# Patient Record
Sex: Male | Born: 1950 | Race: Black or African American | Hispanic: No | Marital: Married | Smoking: Former smoker
Health system: Southern US, Community
[De-identification: ages and names within clinical notes are randomized; demographics above are authoritative.]

## PROBLEM LIST (undated history)

## (undated) DIAGNOSIS — Z9289 Personal history of other medical treatment: Secondary | ICD-10-CM

## (undated) DIAGNOSIS — N183 Chronic kidney disease, stage 3 unspecified: Secondary | ICD-10-CM

## (undated) DIAGNOSIS — E1169 Type 2 diabetes mellitus with other specified complication: Secondary | ICD-10-CM

## (undated) DIAGNOSIS — M869 Osteomyelitis, unspecified: Secondary | ICD-10-CM

## (undated) DIAGNOSIS — H905 Unspecified sensorineural hearing loss: Secondary | ICD-10-CM

## (undated) DIAGNOSIS — E119 Type 2 diabetes mellitus without complications: Secondary | ICD-10-CM

## (undated) DIAGNOSIS — E785 Hyperlipidemia, unspecified: Secondary | ICD-10-CM

## (undated) DIAGNOSIS — H3500 Unspecified background retinopathy: Secondary | ICD-10-CM

## (undated) DIAGNOSIS — K219 Gastro-esophageal reflux disease without esophagitis: Secondary | ICD-10-CM

## (undated) DIAGNOSIS — M1990 Unspecified osteoarthritis, unspecified site: Secondary | ICD-10-CM

## (undated) DIAGNOSIS — G4733 Obstructive sleep apnea (adult) (pediatric): Secondary | ICD-10-CM

## (undated) DIAGNOSIS — I10 Essential (primary) hypertension: Secondary | ICD-10-CM

## (undated) DIAGNOSIS — G5790 Unspecified mononeuropathy of unspecified lower limb: Secondary | ICD-10-CM

## (undated) DIAGNOSIS — E13311 Other specified diabetes mellitus with unspecified diabetic retinopathy with macular edema: Secondary | ICD-10-CM

## (undated) DIAGNOSIS — N529 Male erectile dysfunction, unspecified: Secondary | ICD-10-CM

## (undated) DIAGNOSIS — J45909 Unspecified asthma, uncomplicated: Secondary | ICD-10-CM

## (undated) DIAGNOSIS — Z9989 Dependence on other enabling machines and devices: Secondary | ICD-10-CM

## (undated) HISTORY — DX: Hyperlipidemia, unspecified: E78.5

## (undated) HISTORY — DX: Gastro-esophageal reflux disease without esophagitis: K21.9

## (undated) HISTORY — DX: Other specified diabetes mellitus with unspecified diabetic retinopathy with macular edema: E13.311

## (undated) HISTORY — DX: Osteomyelitis, unspecified: M86.9

## (undated) HISTORY — DX: Unspecified mononeuropathy of unspecified lower limb: G57.90

## (undated) HISTORY — DX: Male erectile dysfunction, unspecified: N52.9

## (undated) HISTORY — DX: Unspecified sensorineural hearing loss: H90.5

## (undated) HISTORY — DX: Unspecified background retinopathy: H35.00

## (undated) HISTORY — DX: Type 2 diabetes mellitus with other specified complication: E11.69

## (undated) SURGERY — AMPUTATION DIGIT
Anesthesia: Choice | Laterality: Left

---

## 1998-10-08 ENCOUNTER — Emergency Department (HOSPITAL_COMMUNITY): Admission: EM | Admit: 1998-10-08 | Discharge: 1998-10-08 | Payer: Self-pay | Admitting: Emergency Medicine

## 2006-01-21 ENCOUNTER — Inpatient Hospital Stay (HOSPITAL_COMMUNITY): Admission: EM | Admit: 2006-01-21 | Discharge: 2006-02-09 | Payer: Self-pay | Admitting: Emergency Medicine

## 2006-01-21 ENCOUNTER — Ambulatory Visit: Payer: Self-pay | Admitting: Internal Medicine

## 2006-01-21 ENCOUNTER — Encounter (INDEPENDENT_AMBULATORY_CARE_PROVIDER_SITE_OTHER): Payer: Self-pay | Admitting: *Deleted

## 2006-01-21 HISTORY — PX: TOE AMPUTATION: SHX809

## 2006-02-12 ENCOUNTER — Encounter (HOSPITAL_BASED_OUTPATIENT_CLINIC_OR_DEPARTMENT_OTHER): Admission: RE | Admit: 2006-02-12 | Discharge: 2006-05-13 | Payer: Self-pay | Admitting: Surgery

## 2006-02-16 ENCOUNTER — Ambulatory Visit: Payer: Self-pay | Admitting: Internal Medicine

## 2006-02-23 ENCOUNTER — Encounter: Admission: RE | Admit: 2006-02-23 | Discharge: 2006-02-23 | Payer: Self-pay | Admitting: Internal Medicine

## 2006-03-01 ENCOUNTER — Ambulatory Visit: Payer: Self-pay | Admitting: Internal Medicine

## 2006-03-05 ENCOUNTER — Ambulatory Visit: Payer: Self-pay | Admitting: Internal Medicine

## 2006-04-01 ENCOUNTER — Ambulatory Visit: Payer: Self-pay | Admitting: Internal Medicine

## 2006-05-04 ENCOUNTER — Ambulatory Visit: Payer: Self-pay | Admitting: Internal Medicine

## 2006-05-14 ENCOUNTER — Encounter (HOSPITAL_BASED_OUTPATIENT_CLINIC_OR_DEPARTMENT_OTHER): Admission: RE | Admit: 2006-05-14 | Discharge: 2006-06-02 | Payer: Self-pay | Admitting: Internal Medicine

## 2006-05-20 ENCOUNTER — Ambulatory Visit: Payer: Self-pay | Admitting: Internal Medicine

## 2006-05-21 DIAGNOSIS — M79609 Pain in unspecified limb: Secondary | ICD-10-CM

## 2006-05-21 DIAGNOSIS — S98139A Complete traumatic amputation of one unspecified lesser toe, initial encounter: Secondary | ICD-10-CM | POA: Insufficient documentation

## 2006-06-14 ENCOUNTER — Encounter (HOSPITAL_BASED_OUTPATIENT_CLINIC_OR_DEPARTMENT_OTHER): Admission: RE | Admit: 2006-06-14 | Discharge: 2006-07-26 | Payer: Self-pay | Admitting: Surgery

## 2006-06-18 ENCOUNTER — Encounter (INDEPENDENT_AMBULATORY_CARE_PROVIDER_SITE_OTHER): Payer: Self-pay | Admitting: Dermatology

## 2006-06-18 ENCOUNTER — Ambulatory Visit: Payer: Self-pay | Admitting: Internal Medicine

## 2006-06-18 LAB — CONVERTED CEMR LAB
BUN: 17 mg/dL (ref 6–23)
CO2: 27 meq/L (ref 19–32)
Calcium: 9.2 mg/dL (ref 8.4–10.5)
Chloride: 107 meq/L (ref 96–112)
Creatinine, Ser: 1.51 mg/dL — ABNORMAL HIGH (ref 0.40–1.50)
Glucose, Bld: 149 mg/dL — ABNORMAL HIGH (ref 70–99)
Potassium: 4.4 meq/L (ref 3.5–5.3)
Sodium: 140 meq/L (ref 135–145)

## 2006-08-09 ENCOUNTER — Encounter: Payer: Self-pay | Admitting: Vascular Surgery

## 2006-08-09 ENCOUNTER — Emergency Department (HOSPITAL_COMMUNITY): Admission: EM | Admit: 2006-08-09 | Discharge: 2006-08-09 | Payer: Self-pay | Admitting: Emergency Medicine

## 2006-08-13 ENCOUNTER — Ambulatory Visit: Payer: Self-pay | Admitting: Internal Medicine

## 2006-08-13 ENCOUNTER — Inpatient Hospital Stay (HOSPITAL_COMMUNITY): Admission: AD | Admit: 2006-08-13 | Discharge: 2006-08-14 | Payer: Self-pay | Admitting: Internal Medicine

## 2006-08-13 DIAGNOSIS — M869 Osteomyelitis, unspecified: Secondary | ICD-10-CM | POA: Insufficient documentation

## 2006-08-13 LAB — CONVERTED CEMR LAB: Blood Glucose, Fingerstick: 114

## 2006-08-16 DIAGNOSIS — R42 Dizziness and giddiness: Secondary | ICD-10-CM

## 2006-08-16 DIAGNOSIS — H9319 Tinnitus, unspecified ear: Secondary | ICD-10-CM | POA: Insufficient documentation

## 2006-08-19 ENCOUNTER — Encounter (INDEPENDENT_AMBULATORY_CARE_PROVIDER_SITE_OTHER): Payer: Self-pay | Admitting: Dermatology

## 2006-08-23 ENCOUNTER — Ambulatory Visit: Payer: Self-pay | Admitting: Hospitalist

## 2006-08-23 DIAGNOSIS — F17200 Nicotine dependence, unspecified, uncomplicated: Secondary | ICD-10-CM | POA: Insufficient documentation

## 2006-08-23 LAB — CONVERTED CEMR LAB: Blood Glucose, Fingerstick: 69

## 2006-08-24 ENCOUNTER — Encounter (HOSPITAL_BASED_OUTPATIENT_CLINIC_OR_DEPARTMENT_OTHER): Admission: RE | Admit: 2006-08-24 | Discharge: 2006-10-08 | Payer: Self-pay | Admitting: Surgery

## 2006-08-24 ENCOUNTER — Encounter (INDEPENDENT_AMBULATORY_CARE_PROVIDER_SITE_OTHER): Payer: Self-pay | Admitting: Dermatology

## 2006-09-01 ENCOUNTER — Encounter (INDEPENDENT_AMBULATORY_CARE_PROVIDER_SITE_OTHER): Payer: Self-pay | Admitting: Dermatology

## 2006-09-13 ENCOUNTER — Encounter (INDEPENDENT_AMBULATORY_CARE_PROVIDER_SITE_OTHER): Payer: Self-pay | Admitting: Internal Medicine

## 2006-09-13 ENCOUNTER — Ambulatory Visit: Payer: Self-pay | Admitting: *Deleted

## 2006-09-13 LAB — CONVERTED CEMR LAB
BUN: 17 mg/dL (ref 6–23)
Blood Glucose, Fingerstick: 168
Chloride: 104 meq/L (ref 96–112)
Cortisol, Plasma: 10.1 ug/dL
Hemoglobin: 13.5 g/dL (ref 13.0–17.0)
Potassium: 4.9 meq/L (ref 3.5–5.3)
RBC: 4.64 M/uL (ref 4.22–5.81)
RDW: 13.8 % (ref 11.5–14.0)
Sodium: 137 meq/L (ref 135–145)
TSH: 1.912 microintl units/mL (ref 0.350–5.50)

## 2006-09-16 ENCOUNTER — Encounter (INDEPENDENT_AMBULATORY_CARE_PROVIDER_SITE_OTHER): Payer: Self-pay | Admitting: Dermatology

## 2006-09-27 ENCOUNTER — Ambulatory Visit: Payer: Self-pay | Admitting: *Deleted

## 2006-09-27 ENCOUNTER — Encounter (INDEPENDENT_AMBULATORY_CARE_PROVIDER_SITE_OTHER): Payer: Self-pay | Admitting: Internal Medicine

## 2006-09-27 DIAGNOSIS — L989 Disorder of the skin and subcutaneous tissue, unspecified: Secondary | ICD-10-CM | POA: Insufficient documentation

## 2006-09-27 LAB — CONVERTED CEMR LAB: Hgb A1c MFr Bld: 6.7 %

## 2006-09-29 ENCOUNTER — Telehealth (INDEPENDENT_AMBULATORY_CARE_PROVIDER_SITE_OTHER): Payer: Self-pay | Admitting: *Deleted

## 2006-10-11 ENCOUNTER — Encounter: Payer: Self-pay | Admitting: Internal Medicine

## 2006-11-03 ENCOUNTER — Telehealth: Payer: Self-pay | Admitting: *Deleted

## 2006-11-04 ENCOUNTER — Encounter (HOSPITAL_BASED_OUTPATIENT_CLINIC_OR_DEPARTMENT_OTHER): Admission: RE | Admit: 2006-11-04 | Discharge: 2006-11-24 | Payer: Self-pay | Admitting: Surgery

## 2006-11-11 ENCOUNTER — Encounter (INDEPENDENT_AMBULATORY_CARE_PROVIDER_SITE_OTHER): Payer: Self-pay | Admitting: Dermatology

## 2006-11-16 ENCOUNTER — Telehealth: Payer: Self-pay | Admitting: *Deleted

## 2006-11-19 ENCOUNTER — Encounter (INDEPENDENT_AMBULATORY_CARE_PROVIDER_SITE_OTHER): Payer: Self-pay | Admitting: Dermatology

## 2006-11-29 ENCOUNTER — Ambulatory Visit (HOSPITAL_COMMUNITY): Admission: RE | Admit: 2006-11-29 | Discharge: 2006-11-29 | Payer: Self-pay | Admitting: Internal Medicine

## 2006-11-29 ENCOUNTER — Encounter (INDEPENDENT_AMBULATORY_CARE_PROVIDER_SITE_OTHER): Payer: Self-pay | Admitting: Internal Medicine

## 2006-12-01 ENCOUNTER — Ambulatory Visit: Payer: Self-pay | Admitting: Hospitalist

## 2006-12-01 ENCOUNTER — Encounter (INDEPENDENT_AMBULATORY_CARE_PROVIDER_SITE_OTHER): Payer: Self-pay | Admitting: Dermatology

## 2006-12-01 ENCOUNTER — Encounter (INDEPENDENT_AMBULATORY_CARE_PROVIDER_SITE_OTHER): Payer: Self-pay | Admitting: Internal Medicine

## 2006-12-01 DIAGNOSIS — N183 Chronic kidney disease, stage 3 (moderate): Secondary | ICD-10-CM

## 2006-12-01 LAB — CONVERTED CEMR LAB: Blood Glucose, Fingerstick: 141

## 2006-12-10 ENCOUNTER — Ambulatory Visit (HOSPITAL_COMMUNITY): Admission: RE | Admit: 2006-12-10 | Discharge: 2006-12-10 | Payer: Self-pay | Admitting: Internal Medicine

## 2006-12-10 ENCOUNTER — Encounter (INDEPENDENT_AMBULATORY_CARE_PROVIDER_SITE_OTHER): Payer: Self-pay | Admitting: Orthopaedic Surgery

## 2006-12-10 ENCOUNTER — Ambulatory Visit: Payer: Self-pay | Admitting: Vascular Surgery

## 2006-12-16 ENCOUNTER — Ambulatory Visit: Payer: Self-pay | Admitting: Internal Medicine

## 2006-12-16 ENCOUNTER — Encounter (INDEPENDENT_AMBULATORY_CARE_PROVIDER_SITE_OTHER): Payer: Self-pay | Admitting: Dermatology

## 2006-12-16 DIAGNOSIS — E1142 Type 2 diabetes mellitus with diabetic polyneuropathy: Secondary | ICD-10-CM

## 2006-12-16 LAB — CONVERTED CEMR LAB
BUN: 18 mg/dL (ref 6–23)
Blood Glucose, Fingerstick: 223
Calcium: 9 mg/dL (ref 8.4–10.5)
Creatinine, Ser: 1.26 mg/dL (ref 0.40–1.50)
Glucose, Bld: 175 mg/dL — ABNORMAL HIGH (ref 70–99)
Potassium: 4.6 meq/L (ref 3.5–5.3)

## 2006-12-30 ENCOUNTER — Encounter (INDEPENDENT_AMBULATORY_CARE_PROVIDER_SITE_OTHER): Payer: Self-pay | Admitting: Dermatology

## 2006-12-30 ENCOUNTER — Ambulatory Visit: Payer: Self-pay | Admitting: Internal Medicine

## 2006-12-30 LAB — CONVERTED CEMR LAB: Blood Glucose, Fingerstick: 195

## 2007-01-03 ENCOUNTER — Encounter: Payer: Self-pay | Admitting: Internal Medicine

## 2007-01-03 DIAGNOSIS — H905 Unspecified sensorineural hearing loss: Secondary | ICD-10-CM | POA: Insufficient documentation

## 2007-01-03 HISTORY — DX: Unspecified sensorineural hearing loss: H90.5

## 2007-01-31 ENCOUNTER — Telehealth (INDEPENDENT_AMBULATORY_CARE_PROVIDER_SITE_OTHER): Payer: Self-pay | Admitting: *Deleted

## 2007-02-08 ENCOUNTER — Encounter: Payer: Self-pay | Admitting: Internal Medicine

## 2007-02-14 ENCOUNTER — Telehealth: Payer: Self-pay | Admitting: Internal Medicine

## 2007-03-16 ENCOUNTER — Ambulatory Visit: Payer: Self-pay | Admitting: Internal Medicine

## 2007-03-16 LAB — CONVERTED CEMR LAB: Blood Glucose, Fingerstick: 278

## 2007-03-17 LAB — CONVERTED CEMR LAB
ALT: 18 units/L (ref 0–53)
AST: 14 units/L (ref 0–37)
Albumin: 4.2 g/dL (ref 3.5–5.2)
Alkaline Phosphatase: 113 units/L (ref 39–117)
BUN: 14 mg/dL (ref 6–23)
Basophils Absolute: 0 10*3/uL (ref 0.0–0.1)
Basophils Relative: 1 % (ref 0–1)
Calcium: 8.9 mg/dL (ref 8.4–10.5)
Chloride: 103 meq/L (ref 96–112)
Eosinophils Absolute: 0.2 10*3/uL (ref 0.0–0.7)
HDL: 35 mg/dL — ABNORMAL LOW (ref >39)
LDL Cholesterol: 100 mg/dL — ABNORMAL HIGH (ref 0–99)
MCHC: 33.7 g/dL (ref 30.0–36.0)
MCV: 87.5 fL (ref 78.0–100.0)
Monocytes Relative: 9 % (ref 3–11)
Neutro Abs: 2.9 10*3/uL (ref 1.7–7.7)
Neutrophils Relative %: 55 % (ref 43–77)
Platelets: 198 10*3/uL (ref 150–400)
Potassium: 4.3 meq/L (ref 3.5–5.3)
RBC: 4.31 M/uL (ref 4.22–5.81)
RDW: 14.1 % — ABNORMAL HIGH (ref 11.5–14.0)
Sodium: 136 meq/L (ref 135–145)

## 2007-03-30 ENCOUNTER — Telehealth (INDEPENDENT_AMBULATORY_CARE_PROVIDER_SITE_OTHER): Payer: Self-pay | Admitting: *Deleted

## 2007-04-13 ENCOUNTER — Encounter (HOSPITAL_BASED_OUTPATIENT_CLINIC_OR_DEPARTMENT_OTHER): Admission: RE | Admit: 2007-04-13 | Discharge: 2007-04-28 | Payer: Self-pay | Admitting: Surgery

## 2007-04-18 ENCOUNTER — Encounter: Payer: Self-pay | Admitting: Internal Medicine

## 2007-05-31 ENCOUNTER — Ambulatory Visit: Payer: Self-pay | Admitting: Internal Medicine

## 2007-05-31 DIAGNOSIS — F528 Other sexual dysfunction not due to a substance or known physiological condition: Secondary | ICD-10-CM | POA: Insufficient documentation

## 2007-05-31 LAB — CONVERTED CEMR LAB
Calcium: 8.9 mg/dL (ref 8.4–10.5)
Creatinine, Ser: 1.54 mg/dL — ABNORMAL HIGH (ref 0.40–1.50)
Glucose, Bld: 141 mg/dL — ABNORMAL HIGH (ref 70–99)
Sodium: 140 meq/L (ref 135–145)

## 2007-06-07 ENCOUNTER — Telehealth: Payer: Self-pay | Admitting: *Deleted

## 2007-07-22 ENCOUNTER — Encounter: Payer: Self-pay | Admitting: Internal Medicine

## 2007-08-15 ENCOUNTER — Ambulatory Visit: Payer: Self-pay | Admitting: Internal Medicine

## 2007-08-15 DIAGNOSIS — E11319 Type 2 diabetes mellitus with unspecified diabetic retinopathy without macular edema: Secondary | ICD-10-CM

## 2007-08-15 DIAGNOSIS — E785 Hyperlipidemia, unspecified: Secondary | ICD-10-CM

## 2007-08-15 DIAGNOSIS — E113299 Type 2 diabetes mellitus with mild nonproliferative diabetic retinopathy without macular edema, unspecified eye: Secondary | ICD-10-CM | POA: Insufficient documentation

## 2007-08-15 LAB — CONVERTED CEMR LAB
ALT: 22 units/L (ref 0–53)
AST: 15 units/L (ref 0–37)
Albumin: 4.6 g/dL (ref 3.5–5.2)
Alkaline Phosphatase: 118 units/L — ABNORMAL HIGH (ref 39–117)
Basophils Absolute: 0 10*3/uL (ref 0.0–0.1)
Basophils Relative: 0 % (ref 0–1)
Calcium: 9.7 mg/dL (ref 8.4–10.5)
Chloride: 102 meq/L (ref 96–112)
Eosinophils Absolute: 0.1 10*3/uL (ref 0.0–0.7)
Hgb A1c MFr Bld: 10 %
MCHC: 33.3 g/dL (ref 30.0–36.0)
Monocytes Relative: 10 % (ref 3–12)
Neutro Abs: 2.6 10*3/uL (ref 1.7–7.7)
Neutrophils Relative %: 51 % (ref 43–77)
Platelets: 168 10*3/uL (ref 150–400)
Potassium: 5.1 meq/L (ref 3.5–5.3)
RBC: 4.63 M/uL (ref 4.22–5.81)
RDW: 13.7 % (ref 11.5–15.5)
Sodium: 137 meq/L (ref 135–145)
Total Protein: 7.6 g/dL (ref 6.0–8.3)

## 2007-09-06 ENCOUNTER — Encounter: Payer: Self-pay | Admitting: Internal Medicine

## 2007-09-15 ENCOUNTER — Telehealth (INDEPENDENT_AMBULATORY_CARE_PROVIDER_SITE_OTHER): Payer: Self-pay | Admitting: *Deleted

## 2007-09-19 ENCOUNTER — Ambulatory Visit: Payer: Self-pay | Admitting: Infectious Diseases

## 2007-09-21 ENCOUNTER — Telehealth: Payer: Self-pay | Admitting: Internal Medicine

## 2007-10-04 ENCOUNTER — Encounter: Payer: Self-pay | Admitting: Internal Medicine

## 2007-10-05 ENCOUNTER — Telehealth: Payer: Self-pay | Admitting: *Deleted

## 2007-10-07 DIAGNOSIS — E11311 Type 2 diabetes mellitus with unspecified diabetic retinopathy with macular edema: Secondary | ICD-10-CM | POA: Insufficient documentation

## 2007-10-19 ENCOUNTER — Ambulatory Visit: Payer: Self-pay | Admitting: Internal Medicine

## 2007-10-25 ENCOUNTER — Encounter: Payer: Self-pay | Admitting: Internal Medicine

## 2007-11-09 ENCOUNTER — Ambulatory Visit: Payer: Self-pay | Admitting: Internal Medicine

## 2007-11-09 ENCOUNTER — Ambulatory Visit: Payer: Self-pay | Admitting: Infectious Disease

## 2007-11-09 LAB — CONVERTED CEMR LAB
ALT: 32 units/L (ref 0–53)
Albumin: 4.4 g/dL (ref 3.5–5.2)
Alkaline Phosphatase: 109 units/L (ref 39–117)
CO2: 24 meq/L (ref 19–32)
Glucose, Bld: 308 mg/dL — ABNORMAL HIGH (ref 70–99)
Hgb A1c MFr Bld: 11.1 %
Potassium: 4.9 meq/L (ref 3.5–5.3)
Sodium: 136 meq/L (ref 135–145)
Total Bilirubin: 0.6 mg/dL (ref 0.3–1.2)
Total Protein: 7.4 g/dL (ref 6.0–8.3)

## 2007-12-06 ENCOUNTER — Telehealth (INDEPENDENT_AMBULATORY_CARE_PROVIDER_SITE_OTHER): Payer: Self-pay | Admitting: Pharmacy Technician

## 2007-12-07 ENCOUNTER — Ambulatory Visit: Payer: Self-pay | Admitting: Internal Medicine

## 2007-12-13 ENCOUNTER — Ambulatory Visit: Payer: Self-pay | Admitting: Internal Medicine

## 2007-12-13 LAB — CONVERTED CEMR LAB
OCCULT 2: NEGATIVE
OCCULT 3: NEGATIVE

## 2007-12-14 ENCOUNTER — Encounter: Payer: Self-pay | Admitting: Internal Medicine

## 2007-12-14 ENCOUNTER — Ambulatory Visit: Payer: Self-pay | Admitting: *Deleted

## 2007-12-14 LAB — CONVERTED CEMR LAB: Blood Glucose, Fingerstick: 224

## 2007-12-21 ENCOUNTER — Encounter: Payer: Self-pay | Admitting: Internal Medicine

## 2007-12-23 LAB — CONVERTED CEMR LAB
BUN: 22 mg/dL (ref 6–23)
Chloride: 104 meq/L (ref 96–112)
Creatinine, Ser: 1.36 mg/dL (ref 0.40–1.50)
Creatinine, Urine: 153.3 mg/dL
Glucose, Bld: 276 mg/dL — ABNORMAL HIGH (ref 70–99)
LDL Cholesterol: 113 mg/dL — ABNORMAL HIGH (ref 0–99)
Microalb, Ur: 1.08 mg/dL (ref 0.00–1.89)
Potassium: 4.7 meq/L (ref 3.5–5.3)
Triglycerides: 153 mg/dL — ABNORMAL HIGH (ref <150)
VLDL: 31 mg/dL (ref 0–40)

## 2007-12-28 ENCOUNTER — Ambulatory Visit: Payer: Self-pay | Admitting: Internal Medicine

## 2007-12-28 DIAGNOSIS — E1165 Type 2 diabetes mellitus with hyperglycemia: Secondary | ICD-10-CM

## 2007-12-28 LAB — CONVERTED CEMR LAB: Blood Glucose, Fingerstick: 278

## 2008-01-10 ENCOUNTER — Telehealth: Payer: Self-pay | Admitting: *Deleted

## 2008-01-18 ENCOUNTER — Telehealth (INDEPENDENT_AMBULATORY_CARE_PROVIDER_SITE_OTHER): Payer: Self-pay | Admitting: *Deleted

## 2008-01-24 ENCOUNTER — Encounter (INDEPENDENT_AMBULATORY_CARE_PROVIDER_SITE_OTHER): Payer: Self-pay | Admitting: Internal Medicine

## 2008-01-24 ENCOUNTER — Telehealth: Payer: Self-pay | Admitting: Internal Medicine

## 2008-01-24 ENCOUNTER — Ambulatory Visit: Payer: Self-pay | Admitting: *Deleted

## 2008-01-25 ENCOUNTER — Ambulatory Visit: Payer: Self-pay | Admitting: Infectious Diseases

## 2008-01-25 ENCOUNTER — Encounter (INDEPENDENT_AMBULATORY_CARE_PROVIDER_SITE_OTHER): Payer: Self-pay | Admitting: Internal Medicine

## 2008-01-25 LAB — CONVERTED CEMR LAB: Blood Glucose, Fingerstick: 245

## 2008-01-26 ENCOUNTER — Telehealth: Payer: Self-pay | Admitting: Internal Medicine

## 2008-01-26 LAB — CONVERTED CEMR LAB
ALT: 26 units/L (ref 0–53)
AST: 14 units/L (ref 0–37)
Albumin: 4.3 g/dL (ref 3.5–5.2)
Calcium: 9.5 mg/dL (ref 8.4–10.5)
Chloride: 103 meq/L (ref 96–112)
Potassium: 4.6 meq/L (ref 3.5–5.3)
Sodium: 141 meq/L (ref 135–145)
Total Protein: 7.5 g/dL (ref 6.0–8.3)

## 2008-01-27 ENCOUNTER — Encounter: Payer: Self-pay | Admitting: Internal Medicine

## 2008-01-27 ENCOUNTER — Telehealth (INDEPENDENT_AMBULATORY_CARE_PROVIDER_SITE_OTHER): Payer: Self-pay | Admitting: *Deleted

## 2008-01-31 ENCOUNTER — Telehealth (INDEPENDENT_AMBULATORY_CARE_PROVIDER_SITE_OTHER): Payer: Self-pay | Admitting: *Deleted

## 2008-02-09 ENCOUNTER — Ambulatory Visit: Payer: Self-pay | Admitting: Internal Medicine

## 2008-02-09 ENCOUNTER — Encounter (INDEPENDENT_AMBULATORY_CARE_PROVIDER_SITE_OTHER): Payer: Self-pay | Admitting: Internal Medicine

## 2008-02-20 ENCOUNTER — Encounter: Payer: Self-pay | Admitting: Internal Medicine

## 2008-02-23 ENCOUNTER — Encounter: Payer: Self-pay | Admitting: Internal Medicine

## 2008-02-23 ENCOUNTER — Ambulatory Visit: Payer: Self-pay | Admitting: Infectious Diseases

## 2008-02-23 ENCOUNTER — Ambulatory Visit (HOSPITAL_COMMUNITY): Admission: RE | Admit: 2008-02-23 | Discharge: 2008-02-23 | Payer: Self-pay | Admitting: Infectious Diseases

## 2008-02-23 DIAGNOSIS — R0609 Other forms of dyspnea: Secondary | ICD-10-CM | POA: Insufficient documentation

## 2008-02-23 DIAGNOSIS — R0989 Other specified symptoms and signs involving the circulatory and respiratory systems: Secondary | ICD-10-CM

## 2008-02-27 ENCOUNTER — Encounter: Payer: Self-pay | Admitting: Internal Medicine

## 2008-03-05 ENCOUNTER — Telehealth: Payer: Self-pay | Admitting: Internal Medicine

## 2008-04-11 ENCOUNTER — Ambulatory Visit (HOSPITAL_COMMUNITY): Admission: RE | Admit: 2008-04-11 | Discharge: 2008-04-11 | Payer: Self-pay | Admitting: Internal Medicine

## 2008-04-11 ENCOUNTER — Ambulatory Visit: Payer: Self-pay | Admitting: Internal Medicine

## 2008-04-11 LAB — CONVERTED CEMR LAB
ALT: 33 units/L (ref 0–53)
Basophils Absolute: 0 10*3/uL (ref 0.0–0.1)
Blood Glucose, Fingerstick: 165
CO2: 28 meq/L (ref 19–32)
Creatinine, Ser: 1.4 mg/dL (ref 0.40–1.50)
Eosinophils Relative: 4 % (ref 0–5)
HCT: 42.7 % (ref 39.0–52.0)
Hemoglobin: 13.9 g/dL (ref 13.0–17.0)
Lymphocytes Relative: 37 % (ref 12–46)
MCHC: 32.5 g/dL (ref 30.0–36.0)
Monocytes Absolute: 0.6 10*3/uL (ref 0.1–1.0)
Pro B Natriuretic peptide (BNP): <30 pg/mL (ref 0.0–100.0)
RDW: 13.6 % (ref 11.5–15.5)
Total Bilirubin: 0.7 mg/dL (ref 0.3–1.2)

## 2008-04-12 ENCOUNTER — Telehealth: Payer: Self-pay | Admitting: Internal Medicine

## 2008-04-13 ENCOUNTER — Encounter: Payer: Self-pay | Admitting: Internal Medicine

## 2008-04-19 ENCOUNTER — Ambulatory Visit (HOSPITAL_COMMUNITY): Admission: RE | Admit: 2008-04-19 | Discharge: 2008-04-19 | Payer: Self-pay | Admitting: Internal Medicine

## 2008-04-19 ENCOUNTER — Encounter: Payer: Self-pay | Admitting: Internal Medicine

## 2008-05-09 ENCOUNTER — Encounter: Payer: Self-pay | Admitting: Internal Medicine

## 2008-05-09 ENCOUNTER — Telehealth: Payer: Self-pay | Admitting: Internal Medicine

## 2008-05-09 ENCOUNTER — Ambulatory Visit: Payer: Self-pay | Admitting: Internal Medicine

## 2008-05-09 LAB — CONVERTED CEMR LAB

## 2008-05-28 ENCOUNTER — Telehealth: Payer: Self-pay | Admitting: *Deleted

## 2008-05-28 ENCOUNTER — Telehealth: Payer: Self-pay | Admitting: Internal Medicine

## 2008-06-05 ENCOUNTER — Telehealth (INDEPENDENT_AMBULATORY_CARE_PROVIDER_SITE_OTHER): Payer: Self-pay | Admitting: *Deleted

## 2008-06-27 ENCOUNTER — Ambulatory Visit: Payer: Self-pay | Admitting: Internal Medicine

## 2008-06-27 DIAGNOSIS — K219 Gastro-esophageal reflux disease without esophagitis: Secondary | ICD-10-CM

## 2008-06-27 LAB — CONVERTED CEMR LAB
Blood Glucose, Fingerstick: 308
Hgb A1c MFr Bld: 10.7 %

## 2008-06-27 LAB — FECAL OCCULT BLOOD, GUAIAC: Fecal Occult Blood: NEGATIVE

## 2008-06-28 ENCOUNTER — Ambulatory Visit: Payer: Self-pay | Admitting: Internal Medicine

## 2008-06-28 LAB — CONVERTED CEMR LAB
ALT: 32 units/L (ref 0–53)
Albumin: 4.1 g/dL (ref 3.5–5.2)
CO2: 23 meq/L (ref 19–32)
Cholesterol: 147 mg/dL (ref 0–200)
Glucose, Bld: 381 mg/dL — ABNORMAL HIGH (ref 70–99)
HCT: 42.2 % (ref 39.0–52.0)
LDL Cholesterol: 57 mg/dL (ref 0–99)
Lymphocytes Relative: 34 % (ref 12–46)
Lymphs Abs: 2 10*3/uL (ref 0.7–4.0)
Neutro Abs: 3.1 10*3/uL (ref 1.7–7.7)
Neutrophils Relative %: 54 % (ref 43–77)
Platelets: 175 10*3/uL (ref 150–400)
Potassium: 4.6 meq/L (ref 3.5–5.3)
Sodium: 134 meq/L — ABNORMAL LOW (ref 135–145)
Total Protein: 7 g/dL (ref 6.0–8.3)
Triglycerides: 322 mg/dL — ABNORMAL HIGH (ref <150)
WBC: 5.8 10*3/uL (ref 4.0–10.5)

## 2008-07-04 ENCOUNTER — Ambulatory Visit: Payer: Self-pay | Admitting: Internal Medicine

## 2008-07-04 LAB — CONVERTED CEMR LAB
OCCULT 2: NEGATIVE
OCCULT 3: NEGATIVE

## 2008-07-09 ENCOUNTER — Telehealth: Payer: Self-pay | Admitting: Internal Medicine

## 2008-07-18 ENCOUNTER — Telehealth: Payer: Self-pay | Admitting: Internal Medicine

## 2008-07-18 ENCOUNTER — Encounter: Payer: Self-pay | Admitting: Internal Medicine

## 2008-07-19 ENCOUNTER — Telehealth: Payer: Self-pay | Admitting: Internal Medicine

## 2008-08-03 ENCOUNTER — Encounter: Payer: Self-pay | Admitting: Internal Medicine

## 2008-08-08 ENCOUNTER — Ambulatory Visit: Payer: Self-pay | Admitting: Internal Medicine

## 2008-08-08 LAB — CONVERTED CEMR LAB
ALT: 29 units/L (ref 0–53)
AST: 26 units/L (ref 0–37)
CO2: 24 meq/L (ref 19–32)
Chloride: 106 meq/L (ref 96–112)
Sodium: 141 meq/L (ref 135–145)
Total Bilirubin: 0.6 mg/dL (ref 0.3–1.2)
Total Protein: 7.3 g/dL (ref 6.0–8.3)

## 2008-08-10 ENCOUNTER — Encounter: Payer: Self-pay | Admitting: Internal Medicine

## 2008-08-22 ENCOUNTER — Telehealth: Payer: Self-pay | Admitting: *Deleted

## 2008-08-24 ENCOUNTER — Telehealth: Payer: Self-pay | Admitting: Internal Medicine

## 2008-08-27 ENCOUNTER — Telehealth (INDEPENDENT_AMBULATORY_CARE_PROVIDER_SITE_OTHER): Payer: Self-pay | Admitting: *Deleted

## 2008-08-29 ENCOUNTER — Encounter: Payer: Self-pay | Admitting: Internal Medicine

## 2008-08-30 ENCOUNTER — Encounter: Payer: Self-pay | Admitting: Internal Medicine

## 2008-09-12 ENCOUNTER — Ambulatory Visit: Payer: Self-pay | Admitting: Internal Medicine

## 2008-09-12 LAB — CONVERTED CEMR LAB
Alkaline Phosphatase: 115 units/L (ref 39–117)
CO2: 24 meq/L (ref 19–32)
Creatinine, Ser: 1.43 mg/dL (ref 0.40–1.50)
Glucose, Bld: 227 mg/dL — ABNORMAL HIGH (ref 70–99)
PSA: 0.35 ng/mL (ref 0.10–4.00)
Sodium: 140 meq/L (ref 135–145)
Total Bilirubin: 0.4 mg/dL (ref 0.3–1.2)
Total Protein: 7 g/dL (ref 6.0–8.3)

## 2008-09-24 ENCOUNTER — Encounter: Payer: Self-pay | Admitting: Internal Medicine

## 2008-09-26 ENCOUNTER — Encounter: Payer: Self-pay | Admitting: Internal Medicine

## 2008-09-26 ENCOUNTER — Ambulatory Visit: Payer: Self-pay | Admitting: Internal Medicine

## 2008-10-01 ENCOUNTER — Telehealth (INDEPENDENT_AMBULATORY_CARE_PROVIDER_SITE_OTHER): Payer: Self-pay | Admitting: Pharmacy Technician

## 2008-10-01 ENCOUNTER — Encounter: Payer: Self-pay | Admitting: Internal Medicine

## 2008-10-10 ENCOUNTER — Telehealth: Payer: Self-pay | Admitting: Internal Medicine

## 2008-10-17 ENCOUNTER — Telehealth (INDEPENDENT_AMBULATORY_CARE_PROVIDER_SITE_OTHER): Payer: Self-pay | Admitting: Pharmacy Technician

## 2008-10-24 ENCOUNTER — Telehealth (INDEPENDENT_AMBULATORY_CARE_PROVIDER_SITE_OTHER): Payer: Self-pay | Admitting: *Deleted

## 2008-10-25 ENCOUNTER — Telehealth: Payer: Self-pay | Admitting: *Deleted

## 2008-10-29 ENCOUNTER — Telehealth: Payer: Self-pay | Admitting: *Deleted

## 2008-10-31 ENCOUNTER — Telehealth: Payer: Self-pay | Admitting: Internal Medicine

## 2008-11-06 ENCOUNTER — Encounter: Payer: Self-pay | Admitting: Internal Medicine

## 2008-11-26 ENCOUNTER — Telehealth: Payer: Self-pay | Admitting: *Deleted

## 2008-11-29 ENCOUNTER — Telehealth (INDEPENDENT_AMBULATORY_CARE_PROVIDER_SITE_OTHER): Payer: Self-pay | Admitting: Pharmacy Technician

## 2008-12-12 ENCOUNTER — Ambulatory Visit: Payer: Self-pay | Admitting: Internal Medicine

## 2008-12-12 LAB — CONVERTED CEMR LAB
Blood Glucose, Fingerstick: 85
Hgb A1c MFr Bld: 8.3 %

## 2008-12-13 ENCOUNTER — Encounter: Payer: Self-pay | Admitting: Internal Medicine

## 2008-12-13 DIAGNOSIS — L84 Corns and callosities: Secondary | ICD-10-CM

## 2008-12-14 ENCOUNTER — Telehealth (INDEPENDENT_AMBULATORY_CARE_PROVIDER_SITE_OTHER): Payer: Self-pay | Admitting: *Deleted

## 2008-12-17 ENCOUNTER — Encounter: Payer: Self-pay | Admitting: Internal Medicine

## 2008-12-18 ENCOUNTER — Telehealth (INDEPENDENT_AMBULATORY_CARE_PROVIDER_SITE_OTHER): Payer: Self-pay | Admitting: *Deleted

## 2008-12-26 ENCOUNTER — Telehealth: Payer: Self-pay | Admitting: *Deleted

## 2008-12-27 ENCOUNTER — Telehealth: Payer: Self-pay | Admitting: Internal Medicine

## 2008-12-29 ENCOUNTER — Telehealth: Payer: Self-pay | Admitting: Internal Medicine

## 2009-01-01 ENCOUNTER — Telehealth: Payer: Self-pay | Admitting: Internal Medicine

## 2009-01-03 ENCOUNTER — Telehealth: Payer: Self-pay | Admitting: Internal Medicine

## 2009-01-15 ENCOUNTER — Encounter: Payer: Self-pay | Admitting: Internal Medicine

## 2009-01-16 ENCOUNTER — Telehealth: Payer: Self-pay | Admitting: *Deleted

## 2009-01-16 ENCOUNTER — Encounter: Payer: Self-pay | Admitting: Internal Medicine

## 2009-01-31 ENCOUNTER — Telehealth: Payer: Self-pay | Admitting: *Deleted

## 2009-02-13 ENCOUNTER — Ambulatory Visit: Payer: Self-pay | Admitting: Internal Medicine

## 2009-02-13 DIAGNOSIS — R609 Edema, unspecified: Secondary | ICD-10-CM | POA: Insufficient documentation

## 2009-02-13 LAB — CONVERTED CEMR LAB
Blood Glucose, Fingerstick: 142
CO2: 27 meq/L (ref 19–32)
Calcium: 9.2 mg/dL (ref 8.4–10.5)
Creatinine, Ser: 1.52 mg/dL — ABNORMAL HIGH (ref 0.40–1.50)
Hgb A1c MFr Bld: 8.8 %
Pro B Natriuretic peptide (BNP): <30 pg/mL (ref 0.0–100.0)
Sodium: 140 meq/L (ref 135–145)

## 2009-02-20 ENCOUNTER — Encounter: Payer: Self-pay | Admitting: Internal Medicine

## 2009-02-20 ENCOUNTER — Ambulatory Visit: Payer: Self-pay | Admitting: Internal Medicine

## 2009-02-20 LAB — CONVERTED CEMR LAB
Creatinine, Urine: 58.9 mg/dL
Microalb Creat Ratio: 8.5 mg/g (ref 0.0–30.0)
Microalb, Ur: 0.5 mg/dL (ref 0.00–1.89)

## 2009-02-26 ENCOUNTER — Telehealth: Payer: Self-pay | Admitting: Internal Medicine

## 2009-03-26 ENCOUNTER — Telehealth: Payer: Self-pay | Admitting: *Deleted

## 2009-03-27 ENCOUNTER — Telehealth (INDEPENDENT_AMBULATORY_CARE_PROVIDER_SITE_OTHER): Payer: Self-pay | Admitting: *Deleted

## 2009-03-27 ENCOUNTER — Telehealth: Payer: Self-pay | Admitting: *Deleted

## 2009-04-01 ENCOUNTER — Telehealth (INDEPENDENT_AMBULATORY_CARE_PROVIDER_SITE_OTHER): Payer: Self-pay | Admitting: *Deleted

## 2009-04-09 ENCOUNTER — Ambulatory Visit (HOSPITAL_COMMUNITY): Admission: RE | Admit: 2009-04-09 | Discharge: 2009-04-09 | Payer: Self-pay | Admitting: Internal Medicine

## 2009-04-09 ENCOUNTER — Ambulatory Visit: Payer: Self-pay | Admitting: Internal Medicine

## 2009-04-09 LAB — CONVERTED CEMR LAB
AST: 18 units/L (ref 0–37)
Albumin: 4.2 g/dL (ref 3.5–5.2)
Alkaline Phosphatase: 142 units/L — ABNORMAL HIGH (ref 39–117)
BUN: 20 mg/dL (ref 6–23)
Creatinine, Ser: 1.66 mg/dL — ABNORMAL HIGH (ref 0.40–1.50)
Glucose, Bld: 373 mg/dL — ABNORMAL HIGH (ref 70–99)
Potassium: 4.5 meq/L (ref 3.5–5.3)

## 2009-04-16 ENCOUNTER — Telehealth (INDEPENDENT_AMBULATORY_CARE_PROVIDER_SITE_OTHER): Payer: Self-pay | Admitting: *Deleted

## 2009-04-19 ENCOUNTER — Telehealth (INDEPENDENT_AMBULATORY_CARE_PROVIDER_SITE_OTHER): Payer: Self-pay | Admitting: *Deleted

## 2009-05-03 ENCOUNTER — Encounter (INDEPENDENT_AMBULATORY_CARE_PROVIDER_SITE_OTHER): Payer: Self-pay | Admitting: Internal Medicine

## 2009-05-03 ENCOUNTER — Ambulatory Visit: Payer: Self-pay | Admitting: Internal Medicine

## 2009-05-15 ENCOUNTER — Telehealth: Payer: Self-pay | Admitting: *Deleted

## 2009-06-04 ENCOUNTER — Telehealth (INDEPENDENT_AMBULATORY_CARE_PROVIDER_SITE_OTHER): Payer: Self-pay | Admitting: *Deleted

## 2009-06-04 ENCOUNTER — Ambulatory Visit: Payer: Self-pay | Admitting: Internal Medicine

## 2009-06-04 LAB — CONVERTED CEMR LAB
ALT: 25 units/L (ref 0–53)
Albumin: 4 g/dL (ref 3.5–5.2)
Alkaline Phosphatase: 142 units/L — ABNORMAL HIGH (ref 39–117)
Blood Glucose, Fingerstick: 357
CO2: 22 meq/L (ref 19–32)
Cholesterol: 132 mg/dL (ref 0–200)
Glucose, Bld: 361 mg/dL — ABNORMAL HIGH (ref 70–99)
LDL Cholesterol: 65 mg/dL (ref 0–99)
Potassium: 4.4 meq/L (ref 3.5–5.3)
Sodium: 136 meq/L (ref 135–145)
Total Bilirubin: 0.6 mg/dL (ref 0.3–1.2)
Total Protein: 6.8 g/dL (ref 6.0–8.3)
VLDL: 39 mg/dL (ref 0–40)

## 2009-06-10 ENCOUNTER — Telehealth: Payer: Self-pay | Admitting: Internal Medicine

## 2009-06-17 ENCOUNTER — Encounter: Payer: Self-pay | Admitting: Internal Medicine

## 2009-06-19 ENCOUNTER — Emergency Department (HOSPITAL_COMMUNITY): Admission: EM | Admit: 2009-06-19 | Discharge: 2009-06-19 | Payer: Self-pay | Admitting: Emergency Medicine

## 2009-06-20 ENCOUNTER — Telehealth (INDEPENDENT_AMBULATORY_CARE_PROVIDER_SITE_OTHER): Payer: Self-pay | Admitting: *Deleted

## 2009-06-20 ENCOUNTER — Ambulatory Visit: Payer: Self-pay | Admitting: Internal Medicine

## 2009-06-20 DIAGNOSIS — E1169 Type 2 diabetes mellitus with other specified complication: Secondary | ICD-10-CM

## 2009-06-20 HISTORY — DX: Type 2 diabetes mellitus with other specified complication: E11.69

## 2009-06-24 ENCOUNTER — Telehealth: Payer: Self-pay | Admitting: Internal Medicine

## 2009-06-26 ENCOUNTER — Telehealth (INDEPENDENT_AMBULATORY_CARE_PROVIDER_SITE_OTHER): Payer: Self-pay | Admitting: *Deleted

## 2009-07-02 ENCOUNTER — Ambulatory Visit: Payer: Self-pay | Admitting: Vascular Surgery

## 2009-07-02 ENCOUNTER — Ambulatory Visit (HOSPITAL_COMMUNITY): Admission: RE | Admit: 2009-07-02 | Discharge: 2009-07-02 | Payer: Self-pay | Admitting: Internal Medicine

## 2009-07-02 ENCOUNTER — Encounter (INDEPENDENT_AMBULATORY_CARE_PROVIDER_SITE_OTHER): Payer: Self-pay | Admitting: Internal Medicine

## 2009-07-04 ENCOUNTER — Ambulatory Visit (HOSPITAL_COMMUNITY): Admission: RE | Admit: 2009-07-04 | Discharge: 2009-07-04 | Payer: Self-pay | Admitting: Internal Medicine

## 2009-07-04 ENCOUNTER — Ambulatory Visit: Payer: Self-pay | Admitting: Internal Medicine

## 2009-07-15 ENCOUNTER — Encounter: Payer: Self-pay | Admitting: Internal Medicine

## 2009-07-18 ENCOUNTER — Encounter: Payer: Self-pay | Admitting: Internal Medicine

## 2009-07-18 ENCOUNTER — Ambulatory Visit: Payer: Self-pay | Admitting: Internal Medicine

## 2009-07-30 ENCOUNTER — Telehealth: Payer: Self-pay | Admitting: Internal Medicine

## 2009-08-01 ENCOUNTER — Telehealth: Payer: Self-pay | Admitting: Internal Medicine

## 2009-08-05 ENCOUNTER — Encounter: Payer: Self-pay | Admitting: Internal Medicine

## 2009-08-07 ENCOUNTER — Telehealth (INDEPENDENT_AMBULATORY_CARE_PROVIDER_SITE_OTHER): Payer: Self-pay | Admitting: *Deleted

## 2009-08-13 ENCOUNTER — Telehealth: Payer: Self-pay | Admitting: *Deleted

## 2009-08-14 ENCOUNTER — Telehealth: Payer: Self-pay | Admitting: *Deleted

## 2009-08-14 ENCOUNTER — Encounter: Payer: Self-pay | Admitting: Internal Medicine

## 2009-08-23 ENCOUNTER — Telehealth (INDEPENDENT_AMBULATORY_CARE_PROVIDER_SITE_OTHER): Payer: Self-pay | Admitting: *Deleted

## 2009-08-28 ENCOUNTER — Ambulatory Visit: Payer: Self-pay | Admitting: Internal Medicine

## 2009-08-28 LAB — CONVERTED CEMR LAB
ALT: 30 units/L (ref 0–53)
AST: 25 units/L (ref 0–37)
Alkaline Phosphatase: 141 units/L — ABNORMAL HIGH (ref 39–117)
Basophils Absolute: 0 10*3/uL (ref 0.0–0.1)
Basophils Relative: 0 % (ref 0–1)
Blood Glucose, Fingerstick: 220
Hgb A1c MFr Bld: 10.8 %
Lymphocytes Relative: 38 % (ref 12–46)
MCHC: 31.5 g/dL (ref 30.0–36.0)
Neutro Abs: 2.9 10*3/uL (ref 1.7–7.7)
Neutrophils Relative %: 50 % (ref 43–77)
Potassium: 4.5 meq/L (ref 3.5–5.3)
RDW: 14.5 % (ref 11.5–15.5)
Sodium: 137 meq/L (ref 135–145)
Total Bilirubin: 0.6 mg/dL (ref 0.3–1.2)
Total Protein: 6.9 g/dL (ref 6.0–8.3)

## 2009-09-02 ENCOUNTER — Encounter: Payer: Self-pay | Admitting: Internal Medicine

## 2009-09-04 ENCOUNTER — Ambulatory Visit: Payer: Self-pay | Admitting: Internal Medicine

## 2009-09-04 LAB — CONVERTED CEMR LAB: Blood Glucose, Fingerstick: 227

## 2009-09-05 ENCOUNTER — Telehealth: Payer: Self-pay | Admitting: Internal Medicine

## 2009-09-06 ENCOUNTER — Telehealth (INDEPENDENT_AMBULATORY_CARE_PROVIDER_SITE_OTHER): Payer: Self-pay | Admitting: *Deleted

## 2009-09-06 ENCOUNTER — Telehealth: Payer: Self-pay | Admitting: *Deleted

## 2009-09-18 ENCOUNTER — Telehealth: Payer: Self-pay | Admitting: *Deleted

## 2009-09-19 ENCOUNTER — Ambulatory Visit: Payer: Self-pay | Admitting: Infectious Diseases

## 2009-09-20 ENCOUNTER — Telehealth (INDEPENDENT_AMBULATORY_CARE_PROVIDER_SITE_OTHER): Payer: Self-pay | Admitting: *Deleted

## 2009-09-24 ENCOUNTER — Telehealth (INDEPENDENT_AMBULATORY_CARE_PROVIDER_SITE_OTHER): Payer: Self-pay | Admitting: *Deleted

## 2009-10-01 ENCOUNTER — Encounter (HOSPITAL_BASED_OUTPATIENT_CLINIC_OR_DEPARTMENT_OTHER): Admission: RE | Admit: 2009-10-01 | Discharge: 2009-12-30 | Payer: Self-pay | Admitting: Internal Medicine

## 2009-10-02 ENCOUNTER — Encounter: Payer: Self-pay | Admitting: Internal Medicine

## 2009-10-02 ENCOUNTER — Ambulatory Visit: Payer: Self-pay | Admitting: Internal Medicine

## 2009-10-02 ENCOUNTER — Encounter (INDEPENDENT_AMBULATORY_CARE_PROVIDER_SITE_OTHER): Payer: Self-pay | Admitting: *Deleted

## 2009-10-03 ENCOUNTER — Encounter: Payer: Self-pay | Admitting: Internal Medicine

## 2009-10-04 ENCOUNTER — Ambulatory Visit (HOSPITAL_COMMUNITY): Admission: RE | Admit: 2009-10-04 | Discharge: 2009-10-04 | Payer: Self-pay | Admitting: Internal Medicine

## 2009-10-07 ENCOUNTER — Telehealth: Payer: Self-pay | Admitting: Internal Medicine

## 2009-10-08 ENCOUNTER — Encounter: Payer: Self-pay | Admitting: Internal Medicine

## 2009-10-08 ENCOUNTER — Telehealth (INDEPENDENT_AMBULATORY_CARE_PROVIDER_SITE_OTHER): Payer: Self-pay | Admitting: *Deleted

## 2009-10-11 ENCOUNTER — Encounter: Payer: Self-pay | Admitting: Internal Medicine

## 2009-10-11 ENCOUNTER — Telehealth (INDEPENDENT_AMBULATORY_CARE_PROVIDER_SITE_OTHER): Payer: Self-pay | Admitting: *Deleted

## 2009-10-15 ENCOUNTER — Encounter: Payer: Self-pay | Admitting: Internal Medicine

## 2009-10-16 ENCOUNTER — Ambulatory Visit: Payer: Self-pay | Admitting: Internal Medicine

## 2009-10-16 LAB — CONVERTED CEMR LAB
Blood Glucose, Fingerstick: 318
CO2: 25 meq/L (ref 19–32)
Calcium: 8.9 mg/dL (ref 8.4–10.5)
Chloride: 98 meq/L (ref 96–112)
Potassium: 4.5 meq/L (ref 3.5–5.3)
Sodium: 134 meq/L — ABNORMAL LOW (ref 135–145)

## 2009-10-18 ENCOUNTER — Ambulatory Visit (HOSPITAL_COMMUNITY): Admission: RE | Admit: 2009-10-18 | Discharge: 2009-10-18 | Payer: Self-pay | Admitting: Internal Medicine

## 2009-10-22 ENCOUNTER — Telehealth (INDEPENDENT_AMBULATORY_CARE_PROVIDER_SITE_OTHER): Payer: Self-pay | Admitting: *Deleted

## 2009-10-23 ENCOUNTER — Encounter: Payer: Self-pay | Admitting: Internal Medicine

## 2009-10-25 ENCOUNTER — Ambulatory Visit: Payer: Self-pay | Admitting: Vascular Surgery

## 2009-10-29 ENCOUNTER — Telehealth: Payer: Self-pay | Admitting: Internal Medicine

## 2009-10-30 ENCOUNTER — Ambulatory Visit: Payer: Self-pay | Admitting: Internal Medicine

## 2009-11-01 ENCOUNTER — Emergency Department (HOSPITAL_COMMUNITY): Admission: EM | Admit: 2009-11-01 | Discharge: 2009-11-01 | Payer: Self-pay | Admitting: Emergency Medicine

## 2009-11-04 ENCOUNTER — Telehealth: Payer: Self-pay | Admitting: Internal Medicine

## 2009-11-04 ENCOUNTER — Telehealth: Payer: Self-pay | Admitting: *Deleted

## 2009-11-07 ENCOUNTER — Telehealth: Payer: Self-pay | Admitting: Internal Medicine

## 2009-11-09 ENCOUNTER — Emergency Department (HOSPITAL_COMMUNITY): Admission: EM | Admit: 2009-11-09 | Discharge: 2009-11-09 | Payer: Self-pay | Admitting: Emergency Medicine

## 2009-11-12 ENCOUNTER — Encounter (INDEPENDENT_AMBULATORY_CARE_PROVIDER_SITE_OTHER): Payer: Self-pay | Admitting: *Deleted

## 2009-11-12 ENCOUNTER — Encounter: Payer: Self-pay | Admitting: Internal Medicine

## 2009-11-12 ENCOUNTER — Ambulatory Visit: Payer: Self-pay | Admitting: Infectious Disease

## 2009-11-12 LAB — CONVERTED CEMR LAB
Cholesterol: 138 mg/dL (ref 0–200)
Creatinine, Urine: 111.4 mg/dL
Microalb, Ur: 0.51 mg/dL (ref 0.00–1.89)
Triglycerides: 226 mg/dL — ABNORMAL HIGH (ref <150)
VLDL: 45 mg/dL — ABNORMAL HIGH (ref 0–40)

## 2009-11-13 LAB — CONVERTED CEMR LAB
BUN: 17 mg/dL (ref 6–23)
CO2: 27 meq/L (ref 19–32)
Chloride: 99 meq/L (ref 96–112)
Potassium: 4.1 meq/L (ref 3.5–5.3)

## 2009-11-14 ENCOUNTER — Telehealth: Payer: Self-pay | Admitting: *Deleted

## 2009-11-14 ENCOUNTER — Encounter: Payer: Self-pay | Admitting: Internal Medicine

## 2009-11-28 ENCOUNTER — Ambulatory Visit: Payer: Self-pay | Admitting: Internal Medicine

## 2009-11-28 LAB — CONVERTED CEMR LAB
BUN: 16 mg/dL (ref 6–23)
CO2: 25 meq/L (ref 19–32)
Chloride: 101 meq/L (ref 96–112)
Creatinine, Ser: 1.42 mg/dL (ref 0.40–1.50)

## 2009-12-03 ENCOUNTER — Telehealth: Payer: Self-pay | Admitting: Licensed Clinical Social Worker

## 2009-12-04 ENCOUNTER — Telehealth: Payer: Self-pay | Admitting: Internal Medicine

## 2009-12-12 ENCOUNTER — Ambulatory Visit: Payer: Self-pay | Admitting: Internal Medicine

## 2009-12-12 DIAGNOSIS — G4733 Obstructive sleep apnea (adult) (pediatric): Secondary | ICD-10-CM

## 2009-12-23 ENCOUNTER — Encounter: Payer: Self-pay | Admitting: Internal Medicine

## 2009-12-26 ENCOUNTER — Encounter: Payer: Self-pay | Admitting: Internal Medicine

## 2010-01-01 ENCOUNTER — Telehealth: Payer: Self-pay | Admitting: Internal Medicine

## 2010-01-01 ENCOUNTER — Ambulatory Visit: Payer: Self-pay | Admitting: Internal Medicine

## 2010-01-01 ENCOUNTER — Telehealth (INDEPENDENT_AMBULATORY_CARE_PROVIDER_SITE_OTHER): Payer: Self-pay | Admitting: *Deleted

## 2010-01-01 LAB — CONVERTED CEMR LAB
Alkaline Phosphatase: 216 units/L — ABNORMAL HIGH (ref 39–117)
BUN: 18 mg/dL (ref 6–23)
Glucose, Bld: 281 mg/dL — ABNORMAL HIGH (ref 70–99)
Total Bilirubin: 0.4 mg/dL (ref 0.3–1.2)

## 2010-01-01 LAB — HM DIABETES FOOT EXAM

## 2010-01-06 ENCOUNTER — Encounter (HOSPITAL_BASED_OUTPATIENT_CLINIC_OR_DEPARTMENT_OTHER)
Admission: RE | Admit: 2010-01-06 | Discharge: 2010-03-06 | Payer: Self-pay | Source: Home / Self Care | Admitting: Internal Medicine

## 2010-01-21 ENCOUNTER — Telehealth: Payer: Self-pay | Admitting: Licensed Clinical Social Worker

## 2010-01-21 ENCOUNTER — Ambulatory Visit (HOSPITAL_BASED_OUTPATIENT_CLINIC_OR_DEPARTMENT_OTHER): Admission: RE | Admit: 2010-01-21 | Discharge: 2010-01-21 | Payer: Self-pay | Admitting: Obstetrics and Gynecology

## 2010-01-21 ENCOUNTER — Encounter: Payer: Self-pay | Admitting: Internal Medicine

## 2010-01-24 ENCOUNTER — Inpatient Hospital Stay (HOSPITAL_COMMUNITY): Admission: EM | Admit: 2010-01-24 | Discharge: 2010-01-29 | Payer: Self-pay | Admitting: Emergency Medicine

## 2010-01-24 ENCOUNTER — Ambulatory Visit: Payer: Self-pay | Admitting: Internal Medicine

## 2010-01-24 ENCOUNTER — Encounter: Payer: Self-pay | Admitting: Internal Medicine

## 2010-01-24 ENCOUNTER — Telehealth: Payer: Self-pay | Admitting: Internal Medicine

## 2010-01-24 DIAGNOSIS — R6510 Systemic inflammatory response syndrome (SIRS) of non-infectious origin without acute organ dysfunction: Secondary | ICD-10-CM | POA: Insufficient documentation

## 2010-01-25 ENCOUNTER — Ambulatory Visit: Payer: Self-pay | Admitting: Internal Medicine

## 2010-01-26 ENCOUNTER — Ambulatory Visit: Payer: Self-pay | Admitting: Vascular Surgery

## 2010-01-26 ENCOUNTER — Encounter: Payer: Self-pay | Admitting: Internal Medicine

## 2010-01-29 ENCOUNTER — Encounter: Payer: Self-pay | Admitting: Internal Medicine

## 2010-01-29 DIAGNOSIS — L0390 Cellulitis, unspecified: Secondary | ICD-10-CM

## 2010-01-29 DIAGNOSIS — L0291 Cutaneous abscess, unspecified: Secondary | ICD-10-CM

## 2010-01-31 ENCOUNTER — Telehealth: Payer: Self-pay | Admitting: *Deleted

## 2010-01-31 ENCOUNTER — Telehealth: Payer: Self-pay | Admitting: Internal Medicine

## 2010-02-01 ENCOUNTER — Encounter (INDEPENDENT_AMBULATORY_CARE_PROVIDER_SITE_OTHER): Payer: Self-pay | Admitting: Emergency Medicine

## 2010-02-01 ENCOUNTER — Emergency Department (HOSPITAL_COMMUNITY): Admission: EM | Admit: 2010-02-01 | Discharge: 2010-02-01 | Payer: Self-pay | Admitting: Emergency Medicine

## 2010-02-01 ENCOUNTER — Encounter: Payer: Self-pay | Admitting: Internal Medicine

## 2010-02-01 ENCOUNTER — Ambulatory Visit: Payer: Self-pay | Admitting: Vascular Surgery

## 2010-02-05 ENCOUNTER — Ambulatory Visit: Payer: Self-pay | Admitting: Internal Medicine

## 2010-02-05 ENCOUNTER — Encounter: Payer: Self-pay | Admitting: Licensed Clinical Social Worker

## 2010-02-05 ENCOUNTER — Telehealth (INDEPENDENT_AMBULATORY_CARE_PROVIDER_SITE_OTHER): Payer: Self-pay | Admitting: *Deleted

## 2010-02-05 LAB — CONVERTED CEMR LAB
ALT: 45 units/L (ref 0–53)
Basophils Relative: 0 % (ref 0–1)
Blood Glucose, Fingerstick: 125
CO2: 27 meq/L (ref 19–32)
Calcium: 10 mg/dL (ref 8.4–10.5)
Chloride: 99 meq/L (ref 96–112)
Eosinophils Relative: 2 % (ref 0–5)
HCT: 38.2 % — ABNORMAL LOW (ref 39.0–52.0)
Hemoglobin: 12.2 g/dL — ABNORMAL LOW (ref 13.0–17.0)
MCHC: 31.9 g/dL (ref 30.0–36.0)
Monocytes Absolute: 0.8 10*3/uL (ref 0.1–1.0)
Monocytes Relative: 12 % (ref 3–12)
Neutro Abs: 2.7 10*3/uL (ref 1.7–7.7)
RBC: 4.28 M/uL (ref 4.22–5.81)
RDW: 14.7 % (ref 11.5–15.5)
Sodium: 138 meq/L (ref 135–145)
Total Protein: 7.7 g/dL (ref 6.0–8.3)

## 2010-02-06 ENCOUNTER — Telehealth: Payer: Self-pay | Admitting: Licensed Clinical Social Worker

## 2010-02-11 ENCOUNTER — Telehealth: Payer: Self-pay | Admitting: Licensed Clinical Social Worker

## 2010-02-17 ENCOUNTER — Encounter: Payer: Self-pay | Admitting: Internal Medicine

## 2010-02-18 ENCOUNTER — Ambulatory Visit (HOSPITAL_COMMUNITY): Admission: RE | Admit: 2010-02-18 | Discharge: 2010-02-18 | Payer: Self-pay | Admitting: Internal Medicine

## 2010-02-24 ENCOUNTER — Telehealth: Payer: Self-pay | Admitting: *Deleted

## 2010-02-25 ENCOUNTER — Encounter: Payer: Self-pay | Admitting: Internal Medicine

## 2010-02-27 ENCOUNTER — Encounter: Payer: Self-pay | Admitting: Licensed Clinical Social Worker

## 2010-03-03 ENCOUNTER — Encounter: Payer: Self-pay | Admitting: Licensed Clinical Social Worker

## 2010-03-03 ENCOUNTER — Telehealth: Payer: Self-pay | Admitting: Licensed Clinical Social Worker

## 2010-03-04 ENCOUNTER — Ambulatory Visit: Payer: Self-pay | Admitting: Internal Medicine

## 2010-03-05 ENCOUNTER — Encounter: Payer: Self-pay | Admitting: Internal Medicine

## 2010-03-10 ENCOUNTER — Telehealth: Payer: Self-pay | Admitting: Internal Medicine

## 2010-03-10 ENCOUNTER — Ambulatory Visit: Payer: Self-pay | Admitting: Internal Medicine

## 2010-03-10 LAB — CONVERTED CEMR LAB: Blood Glucose, Fingerstick: 298

## 2010-03-12 ENCOUNTER — Telehealth (INDEPENDENT_AMBULATORY_CARE_PROVIDER_SITE_OTHER): Payer: Self-pay | Admitting: *Deleted

## 2010-03-14 ENCOUNTER — Telehealth (INDEPENDENT_AMBULATORY_CARE_PROVIDER_SITE_OTHER): Payer: Self-pay | Admitting: *Deleted

## 2010-03-18 ENCOUNTER — Encounter (HOSPITAL_BASED_OUTPATIENT_CLINIC_OR_DEPARTMENT_OTHER): Admission: RE | Admit: 2010-03-18 | Discharge: 2010-05-05 | Payer: Self-pay | Admitting: General Surgery

## 2010-03-19 ENCOUNTER — Telehealth: Payer: Self-pay | Admitting: *Deleted

## 2010-03-23 ENCOUNTER — Encounter: Payer: Self-pay | Admitting: Internal Medicine

## 2010-03-24 ENCOUNTER — Emergency Department (HOSPITAL_COMMUNITY): Admission: EM | Admit: 2010-03-24 | Discharge: 2010-03-24 | Payer: Self-pay | Admitting: Emergency Medicine

## 2010-03-28 DIAGNOSIS — R740 Nonspecific elevation of levels of transaminase and lactic acid dehydrogenase [LDH]: Secondary | ICD-10-CM

## 2010-03-28 DIAGNOSIS — R7401 Elevation of levels of liver transaminase levels: Secondary | ICD-10-CM | POA: Insufficient documentation

## 2010-03-28 LAB — CONVERTED CEMR LAB
AST: 48 units/L — ABNORMAL HIGH (ref 0–37)
BUN: 17 mg/dL (ref 6–23)
CO2: 25 meq/L (ref 19–32)
Calcium: 9.1 mg/dL (ref 8.4–10.5)
Chloride: 101 meq/L (ref 96–112)
Creatinine, Ser: 1.42 mg/dL (ref 0.40–1.50)
Total Bilirubin: 0.4 mg/dL (ref 0.3–1.2)

## 2010-04-04 ENCOUNTER — Telehealth: Payer: Self-pay | Admitting: *Deleted

## 2010-04-09 ENCOUNTER — Telehealth: Payer: Self-pay | Admitting: Licensed Clinical Social Worker

## 2010-04-15 ENCOUNTER — Telehealth: Payer: Self-pay | Admitting: Licensed Clinical Social Worker

## 2010-04-22 ENCOUNTER — Ambulatory Visit: Payer: Self-pay | Admitting: Internal Medicine

## 2010-04-22 LAB — CONVERTED CEMR LAB
Albumin: 4.2 g/dL (ref 3.5–5.2)
Alkaline Phosphatase: 158 units/L — ABNORMAL HIGH (ref 39–117)
Blood Glucose, Fingerstick: 63
CO2: 25 meq/L (ref 19–32)
Calcium: 9 mg/dL (ref 8.4–10.5)
Chloride: 103 meq/L (ref 96–112)
Glucose, Bld: 97 mg/dL (ref 70–99)
Lymphocytes Relative: 40 % (ref 12–46)
Lymphs Abs: 1.9 10*3/uL (ref 0.7–4.0)
Neutro Abs: 2.1 10*3/uL (ref 1.7–7.7)
Neutrophils Relative %: 44 % (ref 43–77)
Platelets: 186 10*3/uL (ref 150–400)
Potassium: 3.9 meq/L (ref 3.5–5.3)
RBC: 4.12 M/uL — ABNORMAL LOW (ref 4.22–5.81)
Sodium: 138 meq/L (ref 135–145)
TSH: 2.668 microintl units/mL (ref 0.350–4.5)
Total Protein: 7.2 g/dL (ref 6.0–8.3)
WBC: 4.7 10*3/uL (ref 4.0–10.5)

## 2010-04-25 ENCOUNTER — Telehealth (INDEPENDENT_AMBULATORY_CARE_PROVIDER_SITE_OTHER): Payer: Self-pay | Admitting: *Deleted

## 2010-04-28 ENCOUNTER — Telehealth: Payer: Self-pay | Admitting: *Deleted

## 2010-04-28 ENCOUNTER — Encounter: Payer: Self-pay | Admitting: Internal Medicine

## 2010-05-08 ENCOUNTER — Encounter (HOSPITAL_BASED_OUTPATIENT_CLINIC_OR_DEPARTMENT_OTHER)
Admission: RE | Admit: 2010-05-08 | Discharge: 2010-08-06 | Payer: Self-pay | Source: Home / Self Care | Attending: General Surgery | Admitting: General Surgery

## 2010-05-08 ENCOUNTER — Encounter: Payer: Self-pay | Admitting: Licensed Clinical Social Worker

## 2010-05-09 ENCOUNTER — Ambulatory Visit (HOSPITAL_COMMUNITY): Admission: RE | Admit: 2010-05-09 | Discharge: 2010-05-09 | Payer: Self-pay | Admitting: General Surgery

## 2010-05-12 ENCOUNTER — Telehealth: Payer: Self-pay | Admitting: Licensed Clinical Social Worker

## 2010-05-15 ENCOUNTER — Encounter: Payer: Self-pay | Admitting: Licensed Clinical Social Worker

## 2010-06-03 ENCOUNTER — Telehealth: Payer: Self-pay | Admitting: *Deleted

## 2010-06-12 ENCOUNTER — Telehealth (INDEPENDENT_AMBULATORY_CARE_PROVIDER_SITE_OTHER): Payer: Self-pay | Admitting: *Deleted

## 2010-06-13 ENCOUNTER — Telehealth: Payer: Self-pay | Admitting: Licensed Clinical Social Worker

## 2010-06-17 ENCOUNTER — Telehealth: Payer: Self-pay | Admitting: Internal Medicine

## 2010-06-26 ENCOUNTER — Telehealth: Payer: Self-pay | Admitting: *Deleted

## 2010-07-01 ENCOUNTER — Telehealth: Payer: Self-pay | Admitting: *Deleted

## 2010-07-08 ENCOUNTER — Encounter: Payer: Self-pay | Admitting: Internal Medicine

## 2010-07-10 ENCOUNTER — Telehealth: Payer: Self-pay | Admitting: Internal Medicine

## 2010-07-15 ENCOUNTER — Telehealth (INDEPENDENT_AMBULATORY_CARE_PROVIDER_SITE_OTHER): Payer: Self-pay | Admitting: *Deleted

## 2010-07-16 ENCOUNTER — Ambulatory Visit: Admission: RE | Admit: 2010-07-16 | Discharge: 2010-07-16 | Payer: Self-pay | Source: Home / Self Care

## 2010-07-16 ENCOUNTER — Encounter: Payer: Self-pay | Admitting: Licensed Clinical Social Worker

## 2010-07-16 ENCOUNTER — Telehealth (INDEPENDENT_AMBULATORY_CARE_PROVIDER_SITE_OTHER): Payer: Self-pay | Admitting: *Deleted

## 2010-07-16 ENCOUNTER — Encounter: Payer: Self-pay | Admitting: Internal Medicine

## 2010-07-16 LAB — CONVERTED CEMR LAB
BUN: 20 mg/dL (ref 6–23)
Blood Glucose, Fingerstick: 289
CO2: 26 meq/L (ref 19–32)
Calcium: 8.8 mg/dL (ref 8.4–10.5)
Chloride: 100 meq/L (ref 96–112)
Creatinine, Ser: 1.41 mg/dL (ref 0.40–1.50)
Glucose, Bld: 252 mg/dL — ABNORMAL HIGH (ref 70–99)
Total Bilirubin: 0.5 mg/dL (ref 0.3–1.2)

## 2010-07-16 LAB — GLUCOSE, CAPILLARY
Glucose-Capillary: 289 mg/dL — ABNORMAL HIGH (ref 70–99)
Glucose-Capillary: 289 mg/dL — ABNORMAL HIGH (ref 70–99)

## 2010-07-17 ENCOUNTER — Telehealth: Payer: Self-pay | Admitting: Licensed Clinical Social Worker

## 2010-07-18 ENCOUNTER — Telehealth (INDEPENDENT_AMBULATORY_CARE_PROVIDER_SITE_OTHER): Payer: Self-pay | Admitting: *Deleted

## 2010-07-18 ENCOUNTER — Telehealth: Payer: Self-pay | Admitting: Internal Medicine

## 2010-07-18 LAB — GLUCOSE, CAPILLARY: Glucose-Capillary: 361 mg/dL — ABNORMAL HIGH (ref 70–99)

## 2010-07-23 ENCOUNTER — Telehealth: Payer: Self-pay | Admitting: Licensed Clinical Social Worker

## 2010-08-07 ENCOUNTER — Telehealth: Payer: Self-pay | Admitting: Internal Medicine

## 2010-08-12 NOTE — Letter (Signed)
Summary: INTERNAL MEDICINE PROGRAM  INTERNAL MEDICINE PROGRAM   Imported By: Margie Billet 11/20/2009 15:12:46  _____________________________________________________________________  External Attachment:    Type:   Image     Comment:   External Document  Appended Document: INTERNAL MEDICINE PROGRAM Document signed by [employer] team due to date of document.  Original dictated document resides in [place].

## 2010-08-12 NOTE — Assessment & Plan Note (Signed)
Summary: feet swelling/off med/gg   Vital Signs:  Patient profile:   60 year old male Height:      78 inches (198.12 cm) Weight:      332.8 pounds (151.27 kg) BMI:     38.60 Temp:     97.1 degrees F oral Pulse rate:   104 / minute BP sitting:   117 / 76  (right arm)  Vitals Entered By: Chinita Pester RN (September 19, 2009 9:30 AM) CC: Feet swelling;stopped taking Actos x 2weeks. Is Patient Diabetic? Yes Did you bring your meter with you today? Yes Pain Assessment Patient in pain? yes     Location: feet/legs Intensity: 8/9 Type: aching Onset of pain  Chronic Nutritional Status BMI of > 30 = obese CBG Result 271  Have you ever been in a relationship where you felt threatened, hurt or afraid?No   Does patient need assistance? Functional Status Self care Ambulation Normal Comments Unable to doenload meter.   Primary Care Provider:  Margarito Liner MD  CC:  Feet swelling;stopped taking Actos x 2weeks.Marland Kitchen  History of Present Illness: [name] is a 60 yo M with PMH of poorly-controlled DM who presents with pedal edema. He has had this problem intermittently for the past several months and most recently it was attributed to Actos, so it was discontinued 3 weeks ago. Since then, his edema has not improved. He says he doesn't have any swelling when he first wakes up, but by the end of the day he has swelling of both feet. He denies the presence of swelling anywhere else in his body, shortness of breath, or chest pain. He also denies increasing his salt intake recently.   Depression History:      The patient denies a depressed mood most of the day and a diminished interest in his usual daily activities.         Preventive Screening-Counseling & Management  Alcohol-Tobacco     Alcohol drinks/day: 0     Alcohol type: An occasional beer; no regular use     Smoking Status: current     Smoking Cessation Counseling: yes     Packs/Day: 1/20     Year Started: 1970     Cans of  tobacco/week: no  Caffeine-Diet-Exercise     Does Patient Exercise: yes     Type of exercise: WALKING     Times/week: 7  Current Medications (verified): 1)  Glipizide 5 Mg  Tabs (Glipizide) .... Take 2 Tablets By Mouth Each Morning and 2 Tablets Each Afternoon. 2)  Hydrocodone-Acetaminophen 5-325 Mg Tabs (Hydrocodone-Acetaminophen) .... Take 1and 1/2 Tablets By Mouth Every 6 Hours As Needed For Pain 3)  Amitriptyline Hcl 75 Mg  Tabs (Amitriptyline Hcl) .... Take 1 Tablet By Mouth Once A Day 4)  Viagra 50 Mg Tabs (Sildenafil Citrate) .... Take One Tablet By Mouth 1 Hour Before Sexual Activity.  Do Not Take More Than 1 Per Day. 5)  Lipitor 20 Mg  Tabs (Atorvastatin Calcium) .... Take 1 Tablet By Mouth Once A Day 6)  Novolog Mix 70/30 Flexpen 70-30 % Susp (Insulin Aspart Prot & Aspart) .... Inject 65 Units Subcutaneously Before Breakfast and 75 Units Before Supper. 7)  Prilosec 20 Mg Cpdr (Omeprazole) .... Take 1 Tablet By Mouth Once A Day 8)  Truetrack Test  Strp (Glucose Blood) .... Use To Test Blood Sugar Before Meals and At Bedtime. 9)  Prodigy Twist Top Lancets 28g  Misc (Lancets) .... Use To Test Blood Sugars Before Meals  and At Bedtime Each Day 10)  Prodigy Insulin Syringe 28g X 1/2" 1 Ml Misc (Insulin Syringe-Needle U-100) .... Use To Injection Insulin Twice Daily 11)  Furosemide 20 Mg Tabs (Furosemide) .... Take 1 Tablet By Mouth Once A Day 12)  Prodigy Short Pen Needles 31g X 8 Mm Misc (Insulin Pen Needle) .... Use As Directed For Insulin Injection Two Times A Day 13)  Prodigy Insulin Syringe 28g X 1/2" 1 Ml Misc (Insulin Syringe-Needle U-100) .... Use To Inject Insuln Twice A Day  Allergies (verified): 1)  ! 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 Father had MI at age 69. Mother had DM; 2 siblings have DM. No prostate cancer.  Social History: Last updated: 05/03/2009 Married; no children from current marriage. Unemployed- not looking for work. Signed up for disability - has not got it yet.  Risk Factors: Alcohol Use: 0 (09/19/2009) Exercise: yes (09/19/2009)  Risk Factors: Smoking Status: current (09/19/2009) Packs/Day: 1/20 (09/19/2009) Cans of tobacco/wk: no (09/19/2009)  Review of Systems CV:  Complains of swelling of feet; denies chest pain or discomfort and swelling of hands. Resp:  Denies chest discomfort and shortness of breath. Derm:  Complains of poor wound healing.  Physical Exam  General:  Well-developed,well-nourished,in no acute distress; alert,appropriate and cooperative throughout examination Head:  Normocephalic, atraumatic Lungs:  Normal respiratory effort, chest expands symmetrically. Lungs are clear to auscultation, no crackles or wheezes. Heart:  Normal rate and regular rhythm. S1 and S2 normal without gallop, murmur, click, rub or other extra sounds. Extremities:  2+ pitting pedal edema bilaterally. Callous R big toe. Old-non-healing laceration on bottom of L foot near toes. Neurologic:  alert &  oriented X3.   Psych:  Cognition and judgment appear intact. Alert and cooperative with normal attention span and concentration. No apparent delusions, illusions, hallucinations   Impression & Recommendations:  Problem # 1:  Hx of EDEMA (ICD-782.3) [name] symptoms and exam are most consistent with venous insufficiency. He had an echo done 10/09 which was essentially normal (EF was 50-55%) and normal BNPs, most recently 8/10. This makes CHF an unlikely diagnosis at this time. Dr. Sampson Goon and I spoke with the patient about the importance of using compression stockings throughout the day, as well as elevation of the feet above the heart whenever possible. We also encouraged him to continue a low-salt diet.  His updated medication list for this problem includes:    Furosemide 20 Mg Tabs (Furosemide) .Marland Kitchen... Take 1 tablet by mouth once a day  Problem # 2:  SKIN LESION (ICD-709.9) Patient has a non-healing laceration at the bottom of his L foot. It was previously seen by the wound care clinic, but he says he feel it has worsened recently. I will refer him to the wound care clinic again.  Orders:  Wound Care Center Referral (Wound Care)  Complete Medication List: 1)  Glipizide 5 Mg Tabs (Glipizide) .... Take 2 tablets by mouth each morning and 2 tablets each afternoon. 2)  Hydrocodone-acetaminophen 5-325 Mg Tabs (Hydrocodone-acetaminophen) .... Take 1and 1/2 tablets by mouth every 6 hours as needed for pain 3)  Amitriptyline Hcl 75 Mg Tabs (Amitriptyline hcl) .... Take 1 tablet by mouth once a day 4)  Viagra 50 Mg Tabs (Sildenafil citrate) .... Take one tablet by mouth 1 hour before sexual activity.  do not take more than 1 per day. 5)  Lipitor 20 Mg Tabs (Atorvastatin calcium) .... Take 1 tablet by mouth once a day 6)  Novolog Mix 70/30 Flexpen 70-30 % Susp (Insulin aspart prot & aspart) .... Inject 65 units subcutaneously before breakfast and 75 units before supper. 7)  Prilosec 20 Mg  Cpdr (Omeprazole) .... Take 1 tablet by mouth once a day 8)  Truetrack Test Strp (Glucose blood) .... Use to test blood sugar before meals and at bedtime. 9)  Prodigy Twist Top Lancets 28g Misc (Lancets) .... Use to test blood sugars before meals and at bedtime each day 10)  Prodigy Insulin Syringe 28g X 1/2" 1 Ml Misc (Insulin syringe-needle u-100) .... Use to injection insulin twice daily 11)  Furosemide 20 Mg Tabs (Furosemide) .... Take 1 tablet by mouth once a day 12)  Prodigy Short Pen Needles 31g X 8 Mm Misc (Insulin pen needle) .... Use as directed for insulin injection two times a day 13)  Prodigy Insulin Syringe 28g X 1/2" 1 Ml Misc (Insulin syringe-needle u-100) .... Use to inject insuln twice a day  Other Orders: Capillary Blood Glucose/CBG (75643)  Patient Instructions: 1)  Please schedule a follow-up appointment in 4-6 weeks with Dr. Meredith Pel. 2)  For your feet swelling, I have prescribed compression stockings. It is very important that you wear them as much as possible during the day. Also, try to elevate your feet. 3)  Continue seeing your podiatrist for feet care.  Prevention & Chronic Care Immunizations   Influenza vaccine: Fluvax 3+  (05/03/2009)   Influenza vaccine deferral: Not available  (02/13/2009)   Influenza vaccine due: 03/13/2010    Tetanus booster: 02/20/2009: Td   Tetanus booster due: 02/21/2019    Pneumococcal vaccine: Pneumovax  (08/28/2009)   Pneumococcal vaccine due: 03/04/2015  Colorectal Screening   Hemoccult: negative x 3  (07/04/2008)   Hemoccult due: 07/2009    Colonoscopy: (1) Diverticula (2) Medium hemorrhoids Exam by Dr. Jordan Hawks. Hung  (09/27/2008)   Colonoscopy due: 10/2018  Other Screening   PSA: 0.58  (08/28/2009)   PSA action/deferral: Discussed-PSA requested  (08/28/2009)   Smoking status: current  (09/19/2009)   Smoking cessation counseling: yes  (09/19/2009)   [facility] quit date: 06/04/2009  (06/04/2009)  Diabetes Mellitus    HgbA1C: 10.8  (08/28/2009)   HgbA1C action/deferral: Ordered  (02/13/2009)   Hemoglobin A1C due: 11/12/2007    Eye exam: Background diabetic retinopathy. Exam by [facility]  (02/27/2008)   Diabetic eye exam action/deferral: Ophthalmology referral  (02/20/2009)   Eye exam due: 02/26/2009    Foot exam: yes  (08/28/2009)   Foot exam action/deferral: Do today   High risk foot: Yes  (08/28/2009)   Foot care education: Done  (08/28/2009)   Foot exam due: 11/25/2009    Urine microalbumin/creatinine ratio: 8.5  (02/20/2009)   Urine microalbumin action/deferral: Ordered   Urine microalbumin/cr due: 12/06/2008  Lipids   Total Cholesterol: 132  (06/04/2009)  Lipid panel action/deferral: Lipid Panel ordered   LDL: 65  (06/04/2009)   LDL Direct: Not documented   HDL: 28  (06/04/2009)   Triglycerides: 193  (06/04/2009)   Lipid panel due: 03/15/2008    SGOT (AST): 25  (08/28/2009)   BMP action: Ordered   SGPT (ALT): 30  (08/28/2009)   Alkaline phosphatase: 141  (08/28/2009)   Total bilirubin: 0.6  (08/28/2009)  Self-Management Support :   Personal Goals (by the next clinic visit) :     Personal A1C goal: 7  (04/09/2009)     Personal blood pressure goal: 130/80  (04/09/2009)     Personal LDL goal: 100  (04/09/2009)    Patient will work on the following items until the next clinic visit to reach self-care goals:     Medications and monitoring: take my medicines every day, check my blood sugar  (09/19/2009)     Eating: eat more vegetables, use fresh or frozen vegetables, eat foods that are low in salt  (09/19/2009)     Activity: take a 30 minute walk every day  (08/28/2009)     Home glucose monitoring frequency: 3 times a day  (06/04/2009)    Diabetes self-management support: Education handout, Resources for patients handout, Written self-care plan  (09/19/2009)   Diabetes care plan printed   Diabetes education handout printed   Last diabetes self-management training by  diabetes educator: 09/04/2009   Last medical nutrition therapy: 06/28/2008    Lipid self-management support: Education handout, Resources for patients handout, Written self-care plan  (09/19/2009)   Lipid self-care plan printed.   Lipid education handout printed      Resource handout printed.

## 2010-08-12 NOTE — Progress Notes (Signed)
Summary: refill/gg  Phone Note Refill Request  on October 07, 2009 3:05 PM  Refills Requested: Medication #1:  VIAGRA 50 MG TABS Take one tablet by mouth 1 hour before sexual activity.  Do not take more than 1 per day.  Method Requested: Electronic Initial call taken by: Merrie Roof RN,  October 07, 2009 3:06 PM  Follow-up for Phone Call        Refilled electronically.  Follow-up by: Margarito Liner MD,  October 08, 2009 11:20 AM    Prescriptions: VIAGRA 50 MG TABS (SILDENAFIL CITRATE) Take one tablet by mouth 1 hour before sexual activity.  Do not take more than 1 per day.  #30 x 3   Entered and Authorized by:   Margarito Liner MD   Signed by:   Margarito Liner MD on 10/08/2009   Method used:   Electronically to        [facility] (retail)       [address]       [place], [place]  [address]       Ph: [phone]       Fax: [phone]   RxID:   [id number]

## 2010-08-12 NOTE — Letter (Signed)
Summary: BLOOD GLUCOSE 10-02-2009-12-05-2009  BLOOD GLUCOSE 10-02-2009-12-05-2009   Imported By: Margie Billet 01/16/2010 10:54:43  _____________________________________________________________________  External Attachment:    Type:   Image     Comment:   External Document

## 2010-08-12 NOTE — Progress Notes (Signed)
Summary: refill/ hla  Phone Note Refill Request Message from:  Fax from Pharmacy on November 07, 2009 11:11 AM  Refills Requested: Medication #1:  TRUETRACK TEST  STRP Use to test blood sugar before meals and at bedtime.   Dosage confirmed as above?Dosage Confirmed   Last Refilled: 2/14 Initial call taken by: Marin Roberts RN,  November 07, 2009 11:11 AM    Prescriptions: Gilman Schmidt TEST  STRP (GLUCOSE BLOOD) Use to test blood sugar before meals and at bedtime.  #150 x 12   Entered and Authorized by:   Doneen Poisson MD   Signed by:   Doneen Poisson MD on 11/07/2009   Method used:   Electronically to        [facility] ([facility])       [address]       [place], [place]  [address]       Ph: [phone]       Fax: [phone]   RxID:   [id number]

## 2010-08-12 NOTE — Letter (Signed)
Summary: Transportation  Transportation   Imported By: Florinda Marker 08/15/2009 15:50:13  _____________________________________________________________________  External Attachment:    Type:   Image     Comment:   External Document

## 2010-08-12 NOTE — Progress Notes (Signed)
Summary: diabetes support/dmr  Phone Note Outgoing Call   Call placed by: Jamison Neighbor RD,CDE,  October 11, 2009 9:05 AM Summary of Call: called patient to discuss trying setting more specific  times he will take his insulin. This is so he will do his best to get all 3 inhections in since sometimes he misses breakfast. He agreed that this may help and he is wliing to try it. We agreed on 10am, 3 pm and 8 pm as this is close to his usual eating times. He just started 3x/day injection regimen to day and will bring meter to visit next Wednesday.    New/Updated Medications: NOVOLOG MIX 70/30 FLEXPEN 70-30 % SUSP (INSULIN ASPART PROT & ASPART) Inject 50 units subcutaneously three times a day before meals as close to  10 am, 3 pm and 8 pm as possible

## 2010-08-12 NOTE — Assessment & Plan Note (Signed)
Summary: 2WK F/U/JOINES/VS   Vital Signs:  Patient profile:   60 year old male Height:      78 inches (198.12 cm) Weight:      139.7 pounds (145.41 kg) BMI:     37.10 Temp:     98.5 degrees F (36.94 degrees C) Pulse rate:   97 / minute BP sitting:   129 / 73  (right arm) Cuff size:   reglaag  Vitals Entered By: Theotis Barrio NT II (July 18, 2009 10:11 AM) CC: 2 week follow uo   . medication refill  / Is Patient Diabetic? Yes Pain Assessment Patient in pain? yes     Location: right leg-foot Intensity: 10 Type: aching/throb Nutritional Status BMI of > 30 = obese  Have you ever been in a relationship where you felt threatened, hurt or afraid?nofol   Does patient need assistance? Functional Status Self care Ambulation Normal Comments 2 week follo ip    appt   Primary Care Provider:  Margarito Liner MD  CC:  2 week follow uo   . medication refill  /.  History of Present Illness: 60 yo man with PMH as listed below who presents for 2 week fu of a diabetic ulcer on the end of the right great toe that the patient says he has been dealing with for  ~1 year. He was last seen 07-04-2009 by Dr. Sunnie Nielsen.  He has had ABIs of both lower extremities 07-04-09  that were normal. He had a recent MRI 07-04-2009 of the right foot that showed no osteo or abscess. It did show mild cellulitis of the great toe.  He has taken doxy and cipro from 06-20-2009 and says that he ran out  ~07-15-2009. It was refilled but he says that he received a call from a doctor telling him that since there was no evidence of osteomyelitis that he did not need to take the antibiotics.   The last time he was here he was referred back to podiatry. He talked to [facility] today who has informed us that his appointment is next Monday with Dr. Okey Dupre.    For last few days thinks he is getting a cold. He has head congestions, runny nose, and non-productive cough. No fevers. No nausea, vomiting, diarrhea.     Preventive  Screening-Counseling & Management  Alcohol-Tobacco     Alcohol drinks/day: 0     Alcohol type: An occasional beer; no regular use     Smoking Status: current     Smoking Cessation Counseling: yes     Packs/Day: 1/20     Year Started: 1970     Cans of tobacco/week: no  Caffeine-Diet-Exercise     Does Patient Exercise: yes     Type of exercise: WALKING     Times/week: 7  Current Medications (verified): 1)  Glipizide 5 Mg  Tabs (Glipizide) .... Take 2 Tablets By Mouth Each Morning and 2 Tablets Each Afternoon. 2)  Hydrocodone-Acetaminophen 5-325 Mg Tabs (Hydrocodone-Acetaminophen) .... Take 1and 1/2 Tablets By Mouth Every 6 Hours As Needed For Pain 3)  Amitriptyline Hcl 75 Mg  Tabs (Amitriptyline Hcl) .... Take 1 Tablet By Mouth Once A Day 4)  Viagra 50 Mg Tabs (Sildenafil Citrate) .... Take One Tablet By Mouth 1 Hour Before Sexual Activity.  Do Not Take More Than 1 Per Day. 5)  Lipitor 20 Mg  Tabs (Atorvastatin Calcium) .... Take 1 Tablet By Mouth Once A Day 6)  Novolog Mix 70/30 Flexpen 70-30 % Susp (  Insulin Aspart Prot & Aspart) .... Inject 55 Units Subcutaneously Before Breakfast and 65 Units Before Supper. 7)  Prilosec 20 Mg Cpdr (Omeprazole) .... Take 1 Tablet By Mouth Once A Day 8)  Truetrack Blood Glucose  Devi (Blood Glucose Monitoring Suppl) .... Use Meter With Test Strips To Check Blood Sugar Before Meals and At Bedtime. 9)  Truetrack Test  Strp (Glucose Blood) .... Use To Test Blood Sugar Before Meals and At Bedtime. 10)  Prodigy Twist Top Lancets 28g  Misc (Lancets) .... Use To Test Blood Sugars Before Meals and At Bedtime Each Day 11)  Prodigy Insulin Syringe 28g X 1/2" 1 Ml Misc (Insulin Syringe-Needle U-100) .... Use To Injection Insulin Twice Daily 12)  Furosemide 20 Mg Tabs (Furosemide) .... Take 1 Tablet By Mouth Once A Day 13)  Actos 15 Mg Tabs (Pioglitazone Hcl) .... Take 1 Tablet By Mouth Once A Day 14)  Novofine 30g X 8 Mm Misc (Insulin Pen Needle) .... Use As  Directed For Insulin Injection Two Times A Day  Allergies (verified): 1)  ! Vancomycin  Review of Systems  The patient denies anorexia, fever, weight loss, weight gain, vision loss, decreased hearing, hoarseness, chest pain, syncope, dyspnea on exertion, peripheral edema, prolonged cough, headaches, hemoptysis, abdominal pain, melena, hematochezia, severe indigestion/heartburn, hematuria, incontinence, genital sores, muscle weakness, transient blindness, unusual weight change, and abnormal bleeding.         See HPI regarding diabetic foot ulcer ad URI symptoms.   Physical Exam  General:  alert, well-developed, well-nourished, and well-hydrated.   Head:  normocephalic and atraumatic.   Eyes:  vision grossly intact, pupils equal, pupils round, and pupils reactive to light.   Ears:  R ear normal and L ear normal.   Nose:  no external deformity.   Lungs:  normal respiratory effort, no accessory muscle use, and normal breath sounds.   Heart:  normal rate, regular rhythm, no murmur, and no gallop.   Abdomen:  soft, non-tender, and normal bowel sounds.   Extremities:  Patient has a large callous that is inflammed with some ulceration on the tip of the right great toe. He also has a large callus on the bottom of the left foot.  He has no other areas of ulceration.  Neurologic:  alert & oriented X3 and cranial nerves II-XII intact.   Psych:  Oriented X3, memory intact for recent and remote, normally interactive, good eye contact, not anxious appearing, and not depressed appearing.     Impression & Recommendations:  Problem # 1:  DIABETIC FOOT ULCER, TOE (ICD-250.80) This patient has a large chronic ulcer on the tip of the right great toe. ABI was WNL. MRI was negative for osteo. He is now off antibiotics. The plan at last visit was to get him seen with Dr. Okey Dupre of podiatry for foot management, and I think foot care of both feet in this patient is of utmost importace. We called today to get  his appointment scheduled and have him scheduled for the beginning of next week. As such, I will not put him back on antibiotics as he has no real changes in the ulcer, no puss draining, no inflammation of the rest of the foot, no fevers, chills, hypotension, etc., that are concering. I have asked that he return in a month to resume care of this and his other medical problems or sooner as needed.  His updated medication list for this problem includes:    Glipizide 5 Mg Tabs (Glipizide) .Marland KitchenMarland KitchenMarland KitchenMarland Kitchen  Take 2 tablets by mouth each morning and 2 tablets each afternoon.    Novolog Mix 70/30 Flexpen 70-30 % Susp (Insulin aspart prot & aspart) ..... Inject 55 units subcutaneously before breakfast and 65 units before supper.    Actos 15 Mg Tabs (Pioglitazone hcl) .Marland Kitchen... Take 1 tablet by mouth once a day  Problem # 2:  URI (ICD-465.9) The patient has symptoms of a cold X2-3 days. No abnormalities observes on exam other than he is sniffling some. No workup or therapy needed at this time but I did tell him that if this worsens, if he has fevers, if he has shortness of breath to please call the clinic.   Problem # 3:  DIABETES MELLITUS, TYPE II, UNCONTROLLED (ICD-250.02) He had his regimen changes in 05-2009 with Dr. Meredith Pel. His A1c had risen greatly at that time, prompting a change in his therapy. I have asked that he return with meter so that we can analyze his meter, check his A1c after 3 months' time, and possible make more changes to his regimen.   His updated medication list for this problem includes:    Glipizide 5 Mg Tabs (Glipizide) .Marland Kitchen... Take 2 tablets by mouth each morning and 2 tablets each afternoon.    Novolog Mix 70/30 Flexpen 70-30 % Susp (Insulin aspart prot & aspart) ..... Inject 55 units subcutaneously before breakfast and 65 units before supper.    Actos 15 Mg Tabs (Pioglitazone hcl) .Marland Kitchen... Take 1 tablet by mouth once a day  Complete Medication List: 1)  Glipizide 5 Mg Tabs (Glipizide) .... Take 2  tablets by mouth each morning and 2 tablets each afternoon. 2)  Hydrocodone-acetaminophen 5-325 Mg Tabs (Hydrocodone-acetaminophen) .... Take 1and 1/2 tablets by mouth every 6 hours as needed for pain 3)  Amitriptyline Hcl 75 Mg Tabs (Amitriptyline hcl) .... Take 1 tablet by mouth once a day 4)  Viagra 50 Mg Tabs (Sildenafil citrate) .... Take one tablet by mouth 1 hour before sexual activity.  do not take more than 1 per day. 5)  Lipitor 20 Mg Tabs (Atorvastatin calcium) .... Take 1 tablet by mouth once a day 6)  Novolog Mix 70/30 Flexpen 70-30 % Susp (Insulin aspart prot & aspart) .... Inject 55 units subcutaneously before breakfast and 65 units before supper. 7)  Prilosec 20 Mg Cpdr (Omeprazole) .... Take 1 tablet by mouth once a day 8)  Truetrack Blood Glucose Devi (Blood glucose monitoring suppl) .... Use meter with test strips to check blood sugar before meals and at bedtime. 9)  Truetrack Test Strp (Glucose blood) .... Use to test blood sugar before meals and at bedtime. 10)  Prodigy Twist Top Lancets 28g Misc (Lancets) .... Use to test blood sugars before meals and at bedtime each day 11)  Prodigy Insulin Syringe 28g X 1/2" 1 Ml Misc (Insulin syringe-needle u-100) .... Use to injection insulin twice daily 12)  Furosemide 20 Mg Tabs (Furosemide) .... Take 1 tablet by mouth once a day 13)  Actos 15 Mg Tabs (Pioglitazone hcl) .... Take 1 tablet by mouth once a day 14)  Novofine 30g X 8 Mm Misc (Insulin pen needle) .... Use as directed for insulin injection two times a day  Patient Instructions: 1)  Please note that you have an appointment on 07-22-2009 with Dr. Okey Dupre at [facility]  2)  Please make a followup appointment in our clinic in 1 month or sooner as needed. At that visit please bring all your medications as well as your gluose  meter so that we can check your A1c, look at your glucose readings, and make changes to your diabetes medications as necessary.    Prevention & Chronic  Care Immunizations   Influenza vaccine: Fluvax 3+  (05/03/2009)   Influenza vaccine deferral: Not available  (02/13/2009)   Influenza vaccine due: 03/13/2009    Tetanus booster: 02/20/2009: Td    Pneumococcal vaccine: Not documented  Colorectal Screening   Hemoccult: negative x 3  (07/04/2008)   Hemoccult due: 07/2009    Colonoscopy: (1) Diverticula (2) Medium hemorrhoids Exam by Dr. Jordan Hawks. Hung  (09/27/2008)   Colonoscopy due: 10/2018  Other Screening   PSA: 0.35  (09/12/2008)   Smoking status: current  (07/18/2009)   Smoking cessation counseling: yes  (07/18/2009)   Target quit date: 06/04/2009  (06/04/2009)  Diabetes Mellitus   HgbA1C: 11.9  (06/04/2009)   HgbA1C action/deferral: Ordered  (02/13/2009)   Hemoglobin A1C due: 11/12/2007    Eye exam: Background diabetic retinopathy. Exam by [facility]  (02/27/2008)   Diabetic eye exam action/deferral: Ophthalmology referral  (02/20/2009)   Eye exam due: 02/26/2009    Foot exam: yes  (04/09/2009)   Foot exam action/deferral: Do today   High risk foot: Yes  (04/09/2009)   Foot care education: Done  (04/09/2009)   Foot exam due: 12/13/2008    Urine microalbumin/creatinine ratio: 8.5  (02/20/2009)   Urine microalbumin action/deferral: Ordered   Urine microalbumin/cr due: 12/06/2008  Lipids   Total Cholesterol: 132  (06/04/2009)   Lipid panel action/deferral: Lipid Panel ordered   LDL: 65  (06/04/2009)   LDL Direct: Not documented   HDL: 28  (06/04/2009)   Triglycerides: 193  (06/04/2009)   Lipid panel due: 03/15/2008    SGOT (AST): 18  (06/04/2009)   BMP action: Ordered   SGPT (ALT): 25  (06/04/2009)   Alkaline phosphatase: 142  (06/04/2009)   Total bilirubin: 0.6  (06/04/2009)  Self-Management Support :   Personal Goals (by the next clinic visit) :     Personal A1C goal: 7  (04/09/2009)     Personal blood pressure goal: 130/80  (04/09/2009)     Personal LDL goal: 100  (04/09/2009)     Patient will work on the following items until the next clinic visit to reach self-care goals:     Medications and monitoring: take my medicines every day, check my blood sugar, examine my feet every day  (07/18/2009)     Eating: drink diet soda or water instead of juice or soda, eat more vegetables, use fresh or frozen vegetables, eat foods that are low in salt, eat baked foods instead of fried foods, eat fruit for snacks and desserts, limit or avoid alcohol  (07/18/2009)     Activity: take a 30 minute walk every day  (07/18/2009)     Home glucose monitoring frequency: 3 times a day  (06/04/2009)    Diabetes self-management support: Copy of home glucose meter record, Written self-care plan  (06/04/2009)   Last diabetes self-management training by diabetes educator: 02/20/2009   Last medical nutrition therapy: 06/28/2008    Lipid self-management support: Written self-care plan  (06/04/2009)    Appended Document: 2WK F/U/JOINES/VS For Dr. Okey Dupre: I think it is fine to go ahead and remove the nail and perform any debridement that is necessary for this patient. Consider this as medical clearance.   Appended Document: 2WK F/U/JOINES/VS Billing: established patient level 4.

## 2010-08-12 NOTE — Progress Notes (Signed)
Summary: insulin form [facility]  Phone Note Call from Patient Call back at Home Phone [phone]   Caller: Patient Summary of Call: patient called to let us know he went to [facility] and received Levemir and has only 3 Novolog pens left. I had spoken to ME Batten at [facility] who concurred that his Novolog for this month had not come in yet. She had wrong phone number for patient, encourage dhis ot leave a message when he calle because nobody works in [facility] full time anymore they just periodically check messages. Insulin received form [facility]: 03/26/10=2 boxes (30 ml) of Levemir pens and 3 boxes (4.5 ml) of Novolog pens 04/18/10= patient received 6 boxes- (90 ml) Levemir pens t  Initial call taken by: Jamison Neighbor RD,CDE,  April 25, 2010 5:01 PM

## 2010-08-12 NOTE — Assessment & Plan Note (Signed)
Summary: ACUTE/JOINES/2 WEEK RECHECK PER [facility]A/[facility]   Vital Signs:  Patient profile:   60 year old male Height:      78 inches (198.12 cm) Weight:      338.2 pounds (151.77 kg) BMI:     38.73 Temp:     96.9 degrees F (36.06 degrees C) oral Pulse rate:   96 / minute BP sitting:   131 / 84  (right arm) Cuff size:   large  Vitals Entered By: Theotis Barrio NT II (December 12, 2009 8:40 AM) CC: PATIENT STATES HE IS HERE FOR ROUTINE OFFICE VISIT /  BILATERAL FOOT PAIN  /  REQUEST GETTING A SCOOTER Is Patient Diabetic? Yes Did you bring your meter with you today? Yes Pain Assessment Patient in pain? yes     Location: FEET Intensity:      10 Type: THROB Onset of pain  SINCE SURGERY Nutritional Status BMI of > 30 = obese  Have you ever been in a relationship where you felt threatened, hurt or afraid?No   Does patient need assistance? Functional Status Self care Ambulation Normal Comments ROUTINE OFFICE VISIT  /  BILATERAL FOOT PAIN   / REQUEST GETTING A SCOOTER   Primary Care Provider:  Margarito Liner MD  CC:  PATIENT STATES HE IS HERE FOR ROUTINE OFFICE VISIT /  BILATERAL FOOT PAIN  /  REQUEST GETTING A SCOOTER.  History of Present Illness: Mr Netzel is a 60 yo man with pMH as outlined below.  He was seen recently in clinic for diabetes and lower extremity swelling.  He is here for follow up on these issues.  His edema is better with current lasix dose.  Being followed at [facility] and has compression stockings at home but has not put them on yesterday.  As for his sugars, he continues to use 70/30:  63, 63, and 65 units.  His sugars continue to run about the same.    Preventive Screening-Counseling & Management  Alcohol-Tobacco     Alcohol drinks/day: 0     Alcohol type: An occasional beer; no regular use     Smoking Status: quit     Smoking Cessation Counseling: yes     Packs/Day: 1/20     Year Started: 1970     Cans of tobacco/week: no  Caffeine-Diet-Exercise     Does  Patient Exercise: yes     Type of exercise: WALKING     Times/week: 7  Current Medications (verified): 1)  Glipizide 5 Mg  Tabs (Glipizide) .... Take 2 Tablets By Mouth Each Morning and 2 Tablets Each Afternoon. 2)  Hydrocodone-Acetaminophen 5-325 Mg Tabs (Hydrocodone-Acetaminophen) .... Take 1and 1/2 Tablets By Mouth Every 6 Hours As Needed For Pain 3)  Amitriptyline Hcl 75 Mg  Tabs (Amitriptyline Hcl) .... Take 1 Tablet By Mouth Once A Day 4)  Viagra 50 Mg Tabs (Sildenafil Citrate) .... Take One Tablet By Mouth 1 Hour Before Sexual Activity.  Do Not Take More Than 1 Per Day. 5)  Lipitor 20 Mg  Tabs (Atorvastatin Calcium) .... Take 1 Tablet By Mouth Once A Day 6)  Novolog Mix 70/30 Flexpen 70-30 % Susp (Insulin Aspart Prot & Aspart) .... Inject 63 Units Subcutaneously Before Breakfast, 63 Units Before Lunch, and 65 Units Before Supper 7)  Prilosec 20 Mg Cpdr (Omeprazole) .... Take 1 Tablet By Mouth Once A Day 8)  Truetrack Test  Strp (Glucose Blood) .... Use To Test Blood Sugar Before Meals and At Bedtime. 9)  Prodigy Twist Top Lancets 28g  Misc (Lancets) .... Use To Test Blood Sugars Before Meals and At Bedtime Each Day 10)  Prodigy Insulin Syringe 28g X 1/2" 1 Ml Misc (Insulin Syringe-Needle U-100) .... Use To Injection Insulin Twice Daily 11)  Furosemide 20 Mg Tabs (Furosemide) .... Take 2 Tablets By Mouth Three Times A Day 12)  Prodigy Short Pen Needles 31g X 8 Mm Misc (Insulin Pen Needle) .... Use As Directed For Insulin Injection Two Times A Day 13)  Prodigy Insulin Syringe 28g X 1/2" 1 Ml Misc (Insulin Syringe-Needle U-100) .... Use To Inject Insuln Twice A Day 14)  Walker  Misc (Misc. Devices) .... Use As Directed.  Allergies (verified): 1)  ! 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 Father had MI at age 39. Mother had DM; 2 siblings have DM. No prostate cancer.  Social History: Last updated: 05/03/2009 Married; no children from current marriage. Unemployed- not looking for work. Signed up for disability - has not got it yet.  Risk Factors: Smoking Status: quit (12/12/2009) Packs/Day: 1/20 (12/12/2009) Cans of tobacco/wk: no (12/12/2009)  Review of Systems      See HPI  Physical Exam  General:  alert, well-developed, and well-nourished.  overweight-appearing.   Eyes:  vision grossly intact, pupils equal, pupils round, and pupils reactive to light.   Neck:  supple and no carotid bruits.   Lungs:  normal respiratory effort, no accessory muscle use, no crackles, and no wheezes.   Heart:  normal rate, regular rhythm, no murmur, no gallop, and no rub.    Has a prominent S2. Abdomen:  soft, non-tender, normal bowel sounds, and no distention.   Extremities:  +2 bilateral lower extremity edema Neurologic:  alert & oriented X3, cranial nerves II-XII intact, and strength normal in all extremities.  Using crutches Skin:  bilateral diabetic foot ulcers, bandaged. Psych:  Oriented X3, memory intact for recent and remote, and normally interactive.      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 CBGs still not at goal Will increase to  Referral to endocrine done last office visit, appointment on 6/16.... confirmed (apt info given to pt)  His updated medication list for this problem includes:    Glipizide 5 Mg Tabs (Glipizide) .Marland Kitchen... Take 2 tablets by mouth each morning and 2 tablets each afternoon.    Novolog Mix 70/30 Flexpen 70-30 % Susp (Insulin aspart prot & aspart) ..... Inject 66 units subcutaneously before breakfast, 66 units before lunch, and 68 units before supper    Aspir-low 81 Mg Tbec (Aspirin) .Marland Kitchen... Take 1 tablet by mouth once a day  Labs Reviewed: Creat: 1.42 (11/28/2009)     Last Eye  Exam: Background diabetic retinopathy. Exam by Beulah Gandy. Ashley Royalty, MD (08/29/2009) Reviewed HgBA1c results: 11.0 (10/16/2009)  10.8 (08/28/2009)  Problem # 2:  DIABETIC FOOT ULCER (ICD-250.80) Being followed at [facility] Has compression stockings, encouraged their use  His updated medication list for this problem includes:    Glipizide 5 Mg Tabs (Glipizide) .Marland Kitchen... Take 2 tablets by mouth each morning and 2 tablets each afternoon.    Novolog Mix 70/30 Flexpen 70-30 % Susp (Insulin aspart prot & aspart) ..... Inject 66 units subcutaneously before breakfast, 66 units before lunch, and 68 units before supper    Aspir-low 81 Mg Tbec (Aspirin) .Marland Kitchen... Take 1 tablet by mouth once a day  Problem # 3:  HYPERLIPIDEMIA (ICD-272.2) At gaol LFTs wnl  His updated medication list for this problem includes:    Lipitor 20 Mg Tabs (Atorvastatin calcium) .Marland Kitchen... Take 1 tablet by mouth once a day  Labs Reviewed: SGOT: 25 (08/28/2009)   SGPT: 30 (08/28/2009)   HDL:32 (11/12/2009), 28 (06/04/2009)  LDL:61 (11/12/2009), 65 (06/04/2009)  Chol:138 (11/12/2009), 132 (06/04/2009)  Trig:226 (11/12/2009), 193 (06/04/2009)  Problem # 4:  ? 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 Meredith Pel prior notes, will add low dose neurontin. Pain continues to not be well controlled and pt actually taking larger amounts of vicodin 5/325 due to lack of control.  Complete Medication List: 1)  Glipizide 5 Mg Tabs (Glipizide) .... Take 2 tablets by mouth each morning and 2 tablets each afternoon. 2)  Hydrocodone-acetaminophen 5-325 Mg Tabs (Hydrocodone-acetaminophen) .... Take 1and 1/2 tablets by mouth every 6 hours as needed for pain 3)  Amitriptyline Hcl 75 Mg Tabs (Amitriptyline hcl) .... Take 1 tablet by mouth once a day 4)  Viagra 50 Mg Tabs (Sildenafil citrate) .... Take one tablet by mouth 1 hour before sexual activity.  do not take more than 1 per day. 5)  Lipitor 20 Mg Tabs (Atorvastatin calcium) .... Take 1 tablet by mouth once a day 6)  Novolog Mix 70/30 Flexpen 70-30 % Susp (Insulin aspart prot & aspart) .... Inject 66 units subcutaneously before breakfast, 66 units before lunch, and 68 units before supper 7)  Prilosec 20 Mg Cpdr (Omeprazole) .... Take 1 tablet by mouth once a day 8)  Truetrack Test Strp (Glucose blood) .... Use to test blood sugar before meals and at bedtime. 9)  Prodigy Twist Top Lancets 28g Misc (Lancets) .... Use to test blood sugars before meals and at bedtime each day 10)  Prodigy Insulin Syringe 28g X 1/2" 1 Ml Misc (Insulin syringe-needle u-100) .... Use to injection insulin twice daily 11)  Furosemide 20 Mg Tabs (Furosemide) .... Take 2 tablets by mouth three times a day 12)  Prodigy Short Pen Needles 31g X 8 Mm Misc (Insulin pen needle) .... Use as directed for insulin injection two times a day 13)  Prodigy Insulin Syringe 28g X 1/2" 1 Ml Misc (Insulin syringe-needle u-100) .... Use to inject insuln twice a day 14)  Walker Misc (Misc. devices)  .... Use as directed. 15)  Neurontin 100 Mg Caps (Gabapentin) .... Take 1 tablet by mouth three times a day 16)  Aspir-low 81 Mg Tbec (Aspirin) .... Take 1 tablet by mouth once a day  Patient Instructions: 1)  Please schedule a follow-up appointment in 1 month. 2)  Lela will give you the  appointment information for the diabetes doctor. 3)  Have sent refills to [facility] 4)  Will increase insulin 70/30 to 66, 66, and 68units (breakfast, lunch, dinner). 5)  I have added neurontin (gabapentin) 100mg  three times a day. 6)  Ordered the sleep study as we discussed. 7)  Take a "baby" low dose aspirin (81mg ) daily. 8)  If you have any problems, call clinic.  9)  Check your blood sugars regularly. If your readings are usually above : or below 70 you should contact our office. Prescriptions: ASPIR-LOW 81 MG TBEC (ASPIRIN) Take 1 tablet by mouth once a day  #100 x prn   Entered and Authorized by:   Mariea Stable MD   Signed by:   Mariea Stable MD on 12/12/2009   Method used:   Electronically to        [facility] (retail)       [address]       [place], [place]  [address]       Ph: [phone]       Fax: [phone]   RxID:   [id number] VIAGRA 50 MG TABS (SILDENAFIL CITRATE) Take one tablet by mouth 1 hour before sexual activity.  Do not take more than 1 per day.  #30 x 3   Entered and Authorized by:   Mariea Stable MD   Signed by:   Mariea Stable MD on 12/12/2009   Method used:   Electronically to        [facility] (retail)       [address]       [place], [place]  [address]       Ph: [phone]       Fax: [phone]   RxID:   [id number] NEURONTIN 100 MG CAPS (GABAPENTIN) Take 1 tablet by mouth three times a day  #90 x 0   Entered and Authorized by:   Mariea Stable MD   Signed by:   Mariea Stable MD on 12/12/2009   Method used:   Electronically to        [facility] (retail)       [address]       [place], [place]  [address]       Ph: [phone]       Fax: [phone]   RxID:   [id number]    Prevention & Chronic Care Immunizations   Influenza vaccine: Fluvax 3+  (05/03/2009)   Influenza vaccine deferral: Not available  (02/13/2009)   Influenza vaccine due: 03/13/2010    Tetanus booster: 02/20/2009: Td   Tetanus booster due: 02/21/2019    Pneumococcal vaccine: Pneumovax  (08/28/2009)   Pneumococcal vaccine due: 03/04/2015  Colorectal Screening   Hemoccult: negative x 3  (07/04/2008)   Hemoccult action/deferral: Not indicated  (12/12/2009)   Hemoccult due: 07/2009    Colonoscopy: (1) Diverticula (2) Medium hemorrhoids Exam by Dr. Jordan Hawks. Hung  (09/27/2008)   Colonoscopy due: 10/2018  Other Screening   PSA: 0.58  (08/28/2009)   PSA action/deferral: Discussed-PSA requested  (08/28/2009)   Smoking status: quit  (12/12/2009)  Diabetes Mellitus   HgbA1C: 11.0  (10/16/2009)   HgbA1C action/deferral: Ordered  (02/13/2009)   Hemoglobin A1C due: 11/12/2007    Eye exam: Background diabetic retinopathy. Exam by Beulah Gandy. Ashley Royalty, MD  (08/29/2009)   Diabetic eye exam  action/deferral: Ophthalmology referral  (02/20/2009)   Eye exam due: 02/26/2009    Foot exam: yes  (08/28/2009)   Foot exam action/deferral: Do today   High risk foot: Yes  (08/28/2009)   Foot care education: Done  (08/28/2009)   Foot exam due: 11/25/2009    Urine microalbumin/creatinine ratio: 4.6  (11/12/2009)   Urine microalbumin action/deferral: Ordered   Urine microalbumin/cr due: 12/06/2008    Diabetes flowsheet reviewed?: Yes   Progress toward A1C goal: Unchanged  Lipids   Total Cholesterol: 138  (11/12/2009)   Lipid panel action/deferral: Lipid Panel ordered   LDL: 61  (11/12/2009)   LDL Direct: Not documented   HDL: 32  (11/12/2009)   Triglycerides: 226  (11/12/2009)   Lipid panel due: 03/15/2008    SGOT (AST): 25   (08/28/2009)   BMP action: Ordered   SGPT (ALT): 30  (08/28/2009)   Alkaline phosphatase: 141  (08/28/2009)   Total bilirubin: 0.6  (08/28/2009)    Lipid flowsheet reviewed?: Yes   Progress toward LDL goal: At goal  Self-Management Support :   Personal Goals (by the next clinic visit) :     Personal A1C goal: 7  (04/09/2009)     Personal blood pressure goal: 130/80  (04/09/2009)     Personal LDL goal: 100  (04/09/2009)    Patient will work on the following items until the next clinic visit to reach self-care goals:     Medications and monitoring: take my medicines every day, check my blood sugar, bring all of my medications to every visit  (12/12/2009)     Eating: drink diet soda or water instead of juice or soda, eat more vegetables, use fresh or frozen vegetables, eat foods that are low in salt, eat baked foods instead of fried foods, eat fruit for snacks and desserts, limit or avoid alcohol  (12/12/2009)     Activity: take a 30 minute walk every day  (11/12/2009)     Home glucose monitoring frequency: 3 times a day  (06/04/2009)    Diabetes self-management support: Written self-care plan  (12/12/2009)   Diabetes care plan printed   Last diabetes self-management training by diabetes educator: 09/25/2009   Last medical nutrition therapy: 06/28/2008    Lipid self-management support: Written self-care plan  (12/12/2009)   Lipid self-care plan printed.    Self-management comments: UNABLE TO WALK DISTANCE DUE TO FEET

## 2010-08-12 NOTE — Progress Notes (Signed)
Summary: confirmation of insulin doses from Dr. Kerr/dmr  Phone Note Outgoing Call   Call placed by: Jamison Neighbor RD,CDE,  February 05, 2010 4:25 PM Summary of Call: asked by Dr.Joines to call Dr. Daune Perch office to confirm patient insulin doses:  per nurse at DR. Kerr's office: patient  seen in their office on 01/16/10 and told to change insulin to the following Novolog 40 at meals plus 1 unit for every 10 mg/dl > 604 Levemir 540 units daily  if > 140  in 1 week then increase levemir to 120 units a day has a follow up apptment- 02/17/10 with Dr. Sharl Ma.  Informed them that he had been tkaing Novolog Mix 70/30 instead of Novolog before meals recently and that we gave him 3 sample pens of Novolog today in office and were trying to refer him to [facility] for assistance with medication.

## 2010-08-12 NOTE — Progress Notes (Signed)
Summary: refill/gg  Phone Note Refill Request  on April 04, 2010 9:25 AM  Refills Requested: Medication #1:  HYDROCODONE-ACETAMINOPHEN 5-325 MG TABS Take 1and 1/2 tablets by mouth every 6 hours as needed for pain   Last Refilled: 03/04/2010  Method Requested: Telephone to Pharmacy Initial call taken by: Filomena Jungling NT II,  April 04, 2010 9:25 AM  Follow-up for Phone Call        Refill approved-nurse to complete. Will let Dr. Meredith Pel complete treatment agreement and UDS at next visit. Follow-up by: Julaine Fusi  DO,  April 04, 2010 10:58 AM  Additional Follow-up for Phone Call Additional follow up Details #1::        Rx called to pharmacy Additional Follow-up by: Merrie Roof RN,  April 04, 2010 2:56 PM    Prescriptions: HYDROCODONE-ACETAMINOPHEN 5-325 MG TABS (HYDROCODONE-ACETAMINOPHEN) Take 1and 1/2 tablets by mouth every 6 hours as needed for pain  #180 x 0   Entered by:   Julaine Fusi  DO   Authorized by:   Merrie Roof RN   Signed by:   Julaine Fusi  DO on 04/04/2010   Method used:   Telephoned to ...       [facility] [id number] (retail)       [address] [place], [place]  [address]       Ph: [phone]       Fax: [phone]   RxID:   [id number]

## 2010-08-12 NOTE — Progress Notes (Signed)
Summary: diabetes support/dmr  Phone Note Call from Patient Call back at Home Phone [phone]   Summary of Call: [phone]- patient left a message at 10 pm last night sayign he;d liek a call be cause he has a serious question he'd like to ask. retuende call today and patient reports his wife wants to know about diet sodas and if they aren't okay to drink- what is. We discussed limiting diet soda is a good idea and alternatives.patient verbalized understanding. Initial call taken by: Jamison Neighbor RD,CDE,  August 23, 2009 11:59 AM

## 2010-08-12 NOTE — Progress Notes (Signed)
Summary: power wheel chair?/dmr  Phone Note Call from Patient Call back at Home Phone [phone]   Caller: Patient Summary of Call: He feels he could use a power chair to get around because the foot doctor put a cast on my right foot now, had one on the left foot, then gave me a shoe for that foot and put a cast on my right foot." It is rough getting around." says foot doctor feels power chair would benefit him.   wants to know about papers that were sent her for power chair. told him that Dr.Joines is out until May 2nd.  Initial call taken by: Jamison Neighbor RD,CDE,  November 04, 2009 4:53 PM  Follow-up for Phone Call        It is best that this issue be addressed by Dr Meredith Pel as he knows the pt well.  If the pt feels that the issue cannot wait until Dr Meredith Pel returns, then he may be seen by [facility] physician but I can't guarentee what the outcome will be.  Thanks Follow-up by: Blanch Media MD,  November 05, 2009 1:52 PM  Additional Follow-up for Phone Call Additional follow up Details #1::        patient left a message on Friday asking about the power wheel chair. he says he will be up here tomorrow for his foot that is full of fluid. says one of his foot doctors  is encouraging the power wheel chair because the cast ( which is off now) was cutting into his foot).   Encouraged patient to discuss this wiht DR. Kanen Mottola tomorrow. will also put htis note on Dr. Meredith Pel desktop. Additional Follow-up by: Jamison Neighbor RD,CDE,  Nov 11, 2009 2:49 PM    Additional Follow-up for Phone Call Additional follow up Details #2::    Will follow up on the issue on May 3rd, when I see him in the clinic.

## 2010-08-12 NOTE — Progress Notes (Signed)
Summary: Refill/gh  Phone Note Refill Request Message from:  Fax from Pharmacy on October 29, 2009 11:00 AM  Refills Requested: Medication #1:  FUROSEMIDE 20 MG TABS Take 2 tablets by mouth once a day Needs refills on the take 2 tablets daily.   Method Requested: Electronic Initial call taken by: Angelina Ok RN,  October 29, 2009 11:02 AM  Follow-up for Phone Call        Refilled electronically.  Follow-up by: Margarito Liner MD,  October 30, 2009 1:52 PM    Prescriptions: FUROSEMIDE 20 MG TABS (FUROSEMIDE) Take 2 tablets by mouth once a day  #62 x 3   Entered and Authorized by:   Margarito Liner MD   Signed by:   Margarito Liner MD on 10/30/2009   Method used:   Electronically to        [facility] (retail)       [address]       [place], [place]  [address]       Ph: [phone]       Fax: [phone]   RxID:   [id number]

## 2010-08-12 NOTE — Progress Notes (Signed)
Summary: refill/gg  Phone Note Call from Patient Call back at Home Phone [phone]   Caller: Patient Summary of Call: Issue #1- needs rx for insulin novolog mix sent ot [facility]? If he has Medicaid- it may need ot be sent ot pharmacy?  asap- has 2 more days.  Issue #2 feet swelling so bad - hard to get shoes on especially thge left. he want sot know if he should go to ER or can we see him?    transferred call to triage nurse Initial call taken by: Jamison Neighbor RD,CDE,  September 18, 2009 2:09 PM  Additional Follow-up for Phone Call Additional follow up Details #1::        Rx [facility] Additional Follow-up by: Marin Roberts RN,  September 18, 2009 4:26 PM    Prescriptions: NOVOLOG MIX 70/30 FLEXPEN 70-30 % SUSP (INSULIN ASPART PROT & ASPART) Inject 65 units subcutaneously before breakfast and 75 units before supper.  #8 boxes x 4   Entered and Authorized by:   Margarito Liner MD   Signed by:   Margarito Liner MD on 09/18/2009   Method used:   Telephoned to ...       [facility] (retail)       [address] [place], [place]  [address]       Ph: [phone]       Fax: [phone]   RxID:   [id number]   Appended Document: refill/gg i personally...at dr Meredith Pel request spoke w/ pt, he denies any/ all symptoms r/t swelling of feet...sob, wt gain, h/a... he does voice knowledge of appt in am and states he will be in clinic...this is relayed to dr Meredith Pel.

## 2010-08-12 NOTE — Progress Notes (Signed)
  Phone Note Outgoing Call   Summary of Call: Called patient and he has informed me that he has a cast on his foot due to diabetic foot ulcer being treated at the wound center.   I told [name] that as soon as he is able please come in so we can redo his SCAT application to reflect recurrent foot ulcer and work out other issues surrounding his strips and ability to obtain medications thru a Medicare D plan.    Follow-up for Phone Call        [name] is coming in tomorrow at 10 AM so we can fill out his SCAT application.   Dorothe Pea  May 14, 2010 11:30 AM

## 2010-08-12 NOTE — Letter (Signed)
Summary: BLOOD GLUCOSE 03-23-2010/04-22-2010  BLOOD GLUCOSE 03-23-2010/04-22-2010   Imported By: Margie Billet 04/29/2010 14:34:15  _____________________________________________________________________  External Attachment:    Type:   Image     Comment:   External Document

## 2010-08-12 NOTE — Initial Assessments (Signed)
Summary: HOSPITAL ADMISSION  INTERNAL MEDICINE ADMISSION HISTORY AND PHYSICAL  First Contact: Dr. Narda Bonds [phone]) Second Contact: Dr. Comer Locket [phone]) Weekdays, Holidays, or after 5pm weekdays: First Contact: [phone] Second Contact:[phone]    PCP: Dr. Meredith Pel  CC: Shortness of breath    HPI:  Patient is a 60 year old man who presents from the clinic today with fever, shortness of breath and non producting coughing and generalized weakness since Tuesday.  He walked 10 miles on Tuesday to fill his presciptions and since then has not been "feeling well". He describes having severe arm and leg pains bilaterally, shaking and feeling dizzy/lightheaded since then.  He has not eaten anything but has been drinking water.    He has also had some abdominal pain, headaches and polyuria (2-3 times per hour).  Denies any chest pain, n/v, dysuria, or diarrhea   He has been taking his medications including Levamir and Novolog.  However, he hasn't taken his Furosemide since Tuesday.  He was sent to the ER straight from the clinic on meeting the SIRS criteria. He was found to be febrile to 102.4, tachycardic to 156 and tachypneic to 22. In the ED, he was hydrated with 1L NS @125cc /hr, and started on IV Rochephin and Azithromycin.   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) HYPERLIPIDEMIA (ICD-272.2) RENAL INSUFFICIENCY, CHRONIC (ICD-585.9) GERD (ICD-530.81) HEARING LOSS, SENSORINEURAL, BILATERAL (ICD-389.10) CALLUS, FOOT (ICD-700) FOOT PAIN (ICD-729.5) ERECTILE DYSFUNCTION (ICD-302.72) SKIN LESION (ICD-709.9) DIZZINESS (ICD-780.4) TINNITUS NOS (ICD-388.30) HYPOTENSION, ORTHOSTATIC  (ICD-458.0) WEIGHT GAIN (ICD-783.1) TOBACCO USER (ICD-305.1) OSTEOMYELITIS NOS, ANKLE/FOOT (ICD-730.27) Hosp for STATUS, OTHER TOE(S) AMPUTATION (ICD-V49.72) DYSPNEA ON EXERTION (ICD-786.09)   MEDICATIONS: Current Meds:  HYDROCODONE-ACETAMINOPHEN 5-325 MG TABS (HYDROCODONE-ACETAMINOPHEN) Take 1and 1/2 tablets by mouth every 6 hours as needed for pain AMITRIPTYLINE HCL 75 MG  TABS (AMITRIPTYLINE HCL) Take 1 tablet by mouth once a day VIAGRA 50 MG TABS (SILDENAFIL CITRATE) Take one tablet by mouth 1 hour before sexual activity.  Do not take more than 1 per day. LIPITOR 20 MG  TABS (ATORVASTATIN CALCIUM) Take 1 tablet by mouth once a day PRILOSEC 20 MG CPDR (OMEPRAZOLE) Take 1 tablet by mouth once a day TRUETRACK TEST  STRP (GLUCOSE BLOOD) Use to test blood sugar before meals and at bedtime. PRODIGY TWIST TOP LANCETS 28G  MISC (LANCETS) Use to test blood sugars before meals and at bedtime each day BD INSULIN SYRINGE ULTRAFINE 31G X 5/16" 1 ML MISC (INSULIN SYRINGE-NEEDLE U-100) Use as directed for once daily insulin injection FUROSEMIDE 20 MG TABS (FUROSEMIDE) Take 2 tablets by mouth three times a day PRODIGY SHORT PEN NEEDLES 31G X 8 MM MISC (INSULIN PEN NEEDLE) Use as directed for insulin injection two times a day WALKER  MISC (MISC. DEVICES) use as directed. NEURONTIN 100 MG CAPS (GABAPENTIN) Take 1 tablet by mouth three times a day ASPIR-LOW 81 MG TBEC (ASPIRIN) Take 1 tablet by mouth once a day LEVEMIR 100 UNIT/ML SOLN (INSULIN DETEMIR) Inject 100 units subcutaneously once a day each morning NOVOLOG FLEXPEN 100 UNIT/ML SOLN (INSULIN ASPART) Inject 30 units subcutaneously at meals plus 1 unit for every 10 mg/dL above 536 mg/dL three times a day at meals  Quit smoking 6 months ago Drinks 2-3  beers usually on the weekends Does not use illicit drugs  SOCIAL HISTORY: Social History: Married; no children from current marriage. Unemployed- not looking for work. Retired in 2009, worked as a  Nutritional therapist for 46yrs Signed up for disability - has not got it yet.   FAMILY HISTORY: Family History: No colon cancer. Father had MI at age 91. Mother had DM, passed in her '80s; 2 siblings have DM. No prostate cancer.  ROS: per HPI  VITALS (In ED):  O2 Sat:      92 % on Room air Temp:     103.2 degrees F (39.11 degrees C) oral Pulse rate:   BP sitting:   109 / 67  (right arm)  PHYSICAL EXAM:  Gen:      Patient appears very ucomfortable Eyes:     PERRL, EOMI, No signs of anemia or jaundince. ENT:      dry mucous membranes, OP clear, No erythema, thrush or exudates. Neck:     Supple, No carotid Bruits, No JVD, No thyromegaly Resp:    CTA- Bilaterally, No W/C/R. CVS:     S1S2 audible, tachycardic, irregularly irregular, No M/R/G GI:       Abdomen is soft, obses, faint bowel sounds, moderately tender to palpation diffusely in lower quadrant but worse on the left. No organomegaly. Ext:      No pedal edema, cyanosis or clubbing. GU:     No CVA tenderness. Skin:   No visible rashes, scars. He appears very dry Lymph:  No palpable lymphadenopathy. MS:    Moving all 4 extremities. Neuro:   A&O X3, CN II - XII are grossly intact. Motor strength is 5/5 in the all 4 extremities, Sensations intact to light touch, Gait normal, Cerebellar signs negative. Psych: Appropriate    LABS:   CBC    WBC                                      13.9       h      4.0-10.5         K/uL  RBC                                      4.22              4.22-5.81        MIL/uL  Hemoglobin (HGB)                         12.6       l      13.0-17.0        g/dL  Hematocrit (HCT)                         38.0       l      39.0-52.0        %  MCV                                      90.2              78.0-100.0       fL  MCH -  29.9              26.0-34.0        pg  MCHC                                     33.2              30.0-36.0        g/dL  RDW                                       14.1              11.5-15.5        %  Platelet Count (PLT)                     144        l      150-400          K/uL  Neutrophils, %                           86         h      43-77            %  Lymphocytes, %                           7          l      12-46            %  Monocytes, %                             7                 3-12             %  Eosinophils, %                           0                 0-5              %  Basophils, %                             0                 0-1              %  Neutrophils, Absolute                    11.9       h      1.7-7.7          K/uL  Lymphocytes, Absolute                    1.0               0.7-4.0          K/uL  Monocytes, Absolute  1.0               0.1-1.0          K/uL  Eosinophils, Absolute                    0.0               0.0-0.7          K/uL  Basophils, Absolute                      0.0               0.0-0.1          K/uL   CMP   Sodium (NA)                              127        l      135-145          mEq/L  Potassium (K)                            4.3               3.5-5.1          mEq/L  Chloride                                 94         l      96-112           mEq/L  CO2                                      20                19-32            mEq/L  Glucose                                  399        h      70-99            mg/dL  BUN                                      20                6-23             mg/dL  Creatinine                               2.50       h      0.4-1.5          mg/dL  GFR, Est Non African American            27         l      >60  mL/min  GFR, Est African American                32         l      >60              mL/min  Bilirubin, Total                         0.8               0.3-1.2          mg/dL  Alkaline Phosphatase                     144        h      39-117           U/L  SGOT (AST)                               46         h      0-37              U/L  SGPT (ALT)                               41                0-53             U/L  Total  Protein                           7.6               6.0-8.3          g/dL  Albumin-Blood                            3.4        l      3.5-5.2          g/dL  Calcium                                  8.4               8.4-10.5         mg/dL   L-Urine Microscopic   Squamous Epithelial / LPF                RARE              RARE  Casts / HPF                              SEE NOTE.  a      NEG    HYALINE CASTS  WBC / HPF                                0-2               <3  WBC/hpf  RBC / HPF                                0-2               <3               RBC/hpf  Bacteria / HPF                           RARE              RARE  Urine-Other                              SEE NOTE.    MUCOUS PRESENT   Color, Urine                             AMBER      a      YELLOW    BIOCHEMICALS MAY BE AFFECTED BY COLOR  Appearance                               HAZY       a      CLEAR  Specific Gravity                         1.025             1.005-1.030  pH                                       5.5               5.0-8.0  Urine Glucose                            >1000      a      NEG              mg/dL  Bilirubin                                SMALL      a      NEG  Ketones                                  15         a      NEG              mg/dL  Blood                                    NEGATIVE          NEG  Protein                                  30  a      NEG              mg/dL  Urobilinogen                             1.0               0.0-1.0          mg/dL  Nitrite                                  NEGATIVE          NEG  Leukocytes                               NEGATIVE          NEG  CXR: Mild cardiomegaly with slightly prominent pulmonary vascularity. No focal abnormality  1:  SIRS/CAP-atypical  The patient meets the SIRS criteria with a fever (103.2), tachycardia, tachypnea and  leukocytosis (WBC of 13.9). Associated symptoms of shortness of breath, coughing and chills also suggests an underlying CAP, however, CXR negative today for pna, so very likely atypical. - Admit to step down unit - IV Rocephin and Azithromycin, with blood cultures pending  - Procalcitonin level  - repeat CXR in the am - EKG - Urine Legionella Ag, urine streptococcal Ag  2. Renal Insufficieny; Acute on chronic kidney injury  At baseline, his creatinine is around 1.3 - 1.6, which almost doubled to 2.5 at presentation . This is very likely secondary to dehydration. - Hydrate with 2LNS @ 250cc/hr and then 150cc/hr x 1 day - Hold Lasix - f/u am Cr   3. Hyponatremia  Most likely 2/2 dehydration and he is experiencing a hypovolemic hyponatremia.  - Hydrate as above and follow am BMP   4. Diabetes Type 2  Will keep him on his current regimen and monitor BGs. Last A1C was 11(4/11).  5. Hyperlipidemia  No benefit for statin in acute setting.

## 2010-08-12 NOTE — Letter (Signed)
Summary: [facility]  [facility]  [address]   [place], [place] [address]   Phone: [phone]  Fax: [phone]    03/03/2010   [facility] ATT:  Equipment/CPAP  [phone]   RE:  [name]  [birth year]   Please see CPAP order, sleep study, and demographic information with regard to [name].    [name] received disability this year in the amount of $1,108 per month.  He also received a lump sum payment which has already been exhausted on the bills he accumulated while waiting for disability.   He is needing financial assistance with regard to his CPAP machine.    Thanking you in advance for your assistance in this matter.   Sincerely,    [name] Social Work [phone] Fax:  [phone]  Appended Document: [facility] Referral submission for CPAP faxed to [facility] this PM.

## 2010-08-12 NOTE — Letter (Signed)
Summary: BLOOD GLUCOSE REPORT  BLOOD GLUCOSE REPORT   Imported By: Margie Billet 12/16/2009 14:16:07  _____________________________________________________________________  External Attachment:    Type:   Image     Comment:   External Document

## 2010-08-12 NOTE — Progress Notes (Signed)
  Phone Note [employer] from:  Fax from Pharmacy on September 06, 2009 2:56 PM  Refills Requested: Medication #1:  HYDROCODONE-ACETAMINOPHEN 5-325 MG TABS Take 1and 1/2 tablets by mouth every 6 hours as needed for pain Summary of Call: after speaking to the pharmacy the pt had 1 refill left from the last script, they are filling it and will notify pt when it can be picked up Initial call taken by: Marin Roberts RN,  September 06, 2009 3:02 PM

## 2010-08-12 NOTE — Progress Notes (Signed)
Summary: Prior Approval-Lipitor  Phone Note Outgoing Call   Call placed by: Angelina Ok RN,  Nov 14, 2009 1:50 PM Call placed to: Insurer Summary of Call: Prior Authorization for Lipitor 20 mg tablets.  Approved 11/14/2009 thru 11/14/2010. Angelina Ok RN  Nov 14, 2009 1:51 PM  Initial call taken by: Angelina Ok RN,  Nov 14, 2009 1:52 PM    New/Updated Medications: LIPITOR 20 MG  TABS (ATORVASTATIN CALCIUM) Take 1 tablet by mouth once a day

## 2010-08-12 NOTE — Consult Note (Signed)
[facility]   Imported By: Louretta Parma 05/20/2010 09:12:40  _____________________________________________________________________  External Attachment:    Type:   Image     Comment:   External Document

## 2010-08-12 NOTE — Progress Notes (Signed)
  Phone Note Call from Patient   Caller: Patient Summary of Call: [facility] will drop form off 04/10/10 that he needs signed by Dr. Meredith Pel to allow him to ride the bus for free.  Initial call taken by: Jamison Neighbor RD,CDE,  April 09, 2010 3:21 PM

## 2010-08-12 NOTE — Miscellaneous (Signed)
Summary: [employer] [facility] [employer]   Imported By: Florinda Marker 08/06/2009 13:57:27  _____________________________________________________________________  External Attachment:    Type:   Image     Comment:   External Document

## 2010-08-12 NOTE — Miscellaneous (Signed)
Summary: Soc. Work  Health visitor.  15 minutes.  Mr. Burgueno had questions concerning his strips and that they were $50 at [facility].  I've advised him to go to [employer] to see if they will help him with his strips since I do not have any other way to help him with his strip.  I note that he does not qualifiy for the [facility].   SSD is $1180 and rent is over $700 per month.   Discussed purchasing bus passes on his behalf.  Mr. Pucci said that he gets a considerable discount with his Seniorcard and so the plan is to give him money to purchase either single tix or 31 day pass.   Encouraged Mr. Orourke to reapply for Medicaid MQB because he will get help with his Part B premium and also be able to get on a low income subsidy Medicare D plan.  SW follow-up.    Appended Document: Soc. Work Dealer at [employer] and she can give him $100 strips for $22 under special program.  I will also work with Mr. Daywalt on the bus passes. Left message for Mr. Simington.

## 2010-08-12 NOTE — Progress Notes (Signed)
Summary: diabetes support/dmr  Phone Note Outgoing Call   Call placed by: Jamison Neighbor RD,CDE,  March 12, 2010 12:33 PM Summary of Call: sked by nurse to return patient call with question about new weight loss medicine. glucosusine. Will investigate for patient and get back to him- he agrees not to take it until we discuss further with his doctor.  Follow-up for Phone Call        Letter sent requesting patient to bring supplement in to next office visit to discuss with his doctor. Follow-up by: Jamison Neighbor RD,CDE,  March 18, 2010 9:47 AM     Appended Document: diabetes support/dmr    Phone Note Call from Patient   Caller: Patient Summary of Call: Patient called again asking about supplement: spelled Glucosulin. He is not taking, I suggested he wait ot discuss with doctor, but he says his next appointment is not until October and he needs to know if he should mail it back.    Follow-up for Phone Call        spoke with DR. Meredith Pel who advises patient not take dietary supplement. called patient and informed him of same. He said he'd mail it back Follow-up by: Jamison Neighbor RD,CDE,  March 20, 2010 10:50 AM

## 2010-08-12 NOTE — Consult Note (Signed)
Summary: [facility]   Imported By: Florinda Marker 02/28/2010 16:45:26  _____________________________________________________________________  External Attachment:    Type:   Image     Comment:   External Document

## 2010-08-12 NOTE — Assessment & Plan Note (Signed)
Summary: [name]   Vital Signs:  Patient profile:   60 year old male Height:      78 inches Weight:      333.9 pounds BMI:     38.73 Temp:     97.9 degrees F oral Pulse rate:   92 / minute BP sitting:   130 / 72  Vitals Entered ByFilomena Jungling NT II (Nov 28, 2009 8:42 AM) CC: follow-up visit Is Patient Diabetic? Yes Did you bring your meter with you today? Yes Pain Assessment Patient in pain? yes     Location: leg Intensity: 10 Type: aching Onset of pain  Chronic  Does patient need assistance? Functional Status Self care Ambulation Normal   Primary Care Provider:  Margarito Liner MD  CC:  follow-up visit.  History of Present Illness: 60 year old gentleman with PMH as mentioned in the EMR comes to the office for the follow up on his DM, leg edema.  1. LEG EDEMA: Patient reports that his legs "whole lot better" than before. He has been taking Lasix 40 mg two times a day since I saw this early this month. He denies any new complains. He reports that he is feeling good overall.  2. DM: He reports that he has been checking his blood sugars every day and brings his CBG log.  Patient checks his blood sugars at 10 AM before eating breakfast, at 3PM before his lunch, at 8 PM before his dinner. Before breakfast (at 10 AM) all his blood sugars are more than 200 and a few values are in 300's (161-096). Before lunch (at 3 PM) all his numbers are all over the place ranging anywhere from 186 to 433. At 8 PM before his dinner, again his numbers are all over the places, ranging anywhere from 172- 433.   He denies any complaints.    Preventive Screening-Counseling & Management  Alcohol-Tobacco     Alcohol drinks/day: 0     Alcohol type: An occasional beer; no regular use     Smoking Status: quit     Smoking Cessation Counseling: yes     Packs/Day: 1/20     Year Started: 1970     Cans of tobacco/week: no  Caffeine-Diet-Exercise     Does Patient  Exercise: yes     Type of exercise: WALKING     Times/week: 7  Problems Prior to Update: 1)  Diabetic Foot Ulcer  (ICD-250.80) 2)  Dm W/complication Nos, Type II  (ICD-250.90) 3)  Diabetes Mellitus, Type II, Uncontrolled  (ICD-250.02) 4)  Diabetic Macular Edema  (ICD-362.07) 5)  Background Diabetic Retinopathy  (ICD-362.01) 6)  Peripheral Neuropathy, Lower Extremities, Bilateral  (ICD-355.8) 7)  Foot Pain  (ICD-729.5) 8)  Callus, Toe  (ICD-700) 9)  Callus, Foot  (ICD-700) 10)  Hyperlipidemia  (ICD-272.2) 11)  Renal Insufficiency, Chronic  (ICD-585.9) 12)  Gerd  (ICD-530.81) 13)  Hearing Loss, Sensorineural, Bilateral  (ICD-389.10) 14)  Erectile Dysfunction  (ICD-302.72) 15)  Skin Lesion  (ICD-709.9) 16)  Dizziness  (ICD-780.4) 17)  Tinnitus Nos  (ICD-388.30) 18)  Weight Gain  (ICD-783.1) 19)  Tobacco User  (ICD-305.1) 20)  Osteomyelitis Nos, Ankle/foot  (ICD-730.27) 21)  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 Take 2 Tablets By Mouth Each Morning and 2 Tablets Each Afternoon. 2)  Hydrocodone-Acetaminophen 5-325 Mg  Tabs (Hydrocodone-Acetaminophen) .... Take 1and 1/2 Tablets By Mouth Every 6 Hours As Needed For Pain 3)  Amitriptyline Hcl 75 Mg  Tabs (Amitriptyline Hcl) .... Take 1 Tablet By Mouth Once A Day 4)  Viagra 50 Mg Tabs (Sildenafil Citrate) .... Take One Tablet By Mouth 1 Hour Before Sexual Activity.  Do Not Take More Than 1 Per Day. 5)  Lipitor 20 Mg  Tabs (Atorvastatin Calcium) .... Take 1 Tablet By Mouth Once A Day 6)  Novolog Mix 70/30 Flexpen 70-30 % Susp (Insulin Aspart Prot & Aspart) .... Inject 60 Units Subcutaneously Before Breakfast, 60 Units Before Lunch, and 65 Units Before Supper 7)  Prilosec 20 Mg Cpdr (Omeprazole) .... Take 1 Tablet By Mouth Once A Day 8)  Truetrack Test  Strp (Glucose Blood) .... Use To Test Blood  Sugar Before Meals and At Bedtime. 9)  Prodigy Twist Top Lancets 28g  Misc (Lancets) .... Use To Test Blood Sugars Before Meals and At Bedtime Each Day 10)  Prodigy Insulin Syringe 28g X 1/2" 1 Ml Misc (Insulin Syringe-Needle U-100) .... Use To Injection Insulin Twice Daily 11)  Furosemide 20 Mg Tabs (Furosemide) .... Take 2 Tablets By Mouth Two Times A Day 12)  Prodigy Short Pen Needles 31g X 8 Mm Misc (Insulin Pen Needle) .... Use As Directed For Insulin Injection Two Times A Day 13)  Prodigy Insulin Syringe 28g X 1/2" 1 Ml Misc (Insulin Syringe-Needle U-100) .... Use To Inject Insuln Twice A Day 14)  Walker  Misc (Misc. Devices) .... Use As Directed.  Current Medications (verified): 1)  Glipizide 5 Mg  Tabs (Glipizide) .... Take 2 Tablets By Mouth Each Morning and 2 Tablets Each Afternoon. 2)  Hydrocodone-Acetaminophen 5-325 Mg Tabs (Hydrocodone-Acetaminophen) .... Take 1and 1/2 Tablets By Mouth Every 6 Hours As Needed For Pain 3)  Amitriptyline Hcl 75 Mg  Tabs (Amitriptyline Hcl) .... Take 1 Tablet By Mouth Once A Day 4)  Viagra 50 Mg Tabs (Sildenafil Citrate) .... Take One Tablet By Mouth 1 Hour Before Sexual Activity.  Do Not Take More Than 1 Per Day. 5)  Lipitor 20 Mg  Tabs (Atorvastatin Calcium) .... Take 1 Tablet By Mouth Once A Day 6)  Novolog Mix 70/30 Flexpen 70-30 % Susp (Insulin Aspart Prot & Aspart) .... Inject 60 Units Subcutaneously Before Breakfast, 60 Units Before Lunch, and 65 Units Before Supper 7)  Prilosec 20 Mg Cpdr (Omeprazole) .... Take 1 Tablet By Mouth Once A Day 8)  Truetrack Test  Strp (Glucose Blood) .... Use To Test Blood Sugar Before Meals and At Bedtime. 9)  Prodigy Twist Top Lancets 28g  Misc (Lancets) .... Use To Test Blood Sugars Before Meals and At Bedtime Each Day 10)  Prodigy Insulin Syringe 28g X 1/2" 1 Ml Misc (Insulin Syringe-Needle U-100) .... Use To Injection Insulin Twice Daily 11)  Furosemide 20 Mg Tabs (Furosemide) .... Take 2 Tablets By Mouth Two  Times A Day 12)  Prodigy Short Pen Needles 31g X 8 Mm Misc (Insulin Pen Needle) .... Use As Directed For Insulin Injection Two Times A Day 13)  Prodigy Insulin Syringe 28g X 1/2" 1 Ml Misc (Insulin Syringe-Needle U-100) .... Use To Inject Insuln Twice A Day 14)  Walker  Misc (Misc. Devices) .... Use As Directed.  Allergies (verified): 1)  ! Vancomycin  Past History:  Family History: Last updated: 06/27/2008 No colon cancer. Father had MI at age 46. Mother had DM; 2 siblings have DM. No prostate cancer.  Social History:  Last updated: 05/03/2009 Married; no children from current marriage. Unemployed- not looking for work. Signed up for disability - has not got it yet.  Risk Factors: Alcohol Use: 0 (11/28/2009) Exercise: yes (11/28/2009)  Risk Factors: Smoking Status: quit (11/28/2009) Packs/Day: 1/20 (11/28/2009) Cans of tobacco/wk: no (11/28/2009)  Past Medical History: Reviewed history from 04/09/2009 and no changes required. 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 No colon cancer. Father had MI at age  65. Mother had DM; 2 siblings have DM. No prostate cancer.  Social History: Reviewed history from 05/03/2009 and no changes required. Married; no children from current marriage. Unemployed- not looking for work. Signed up for disability - has not got it yet.  Review of Systems      See HPI  Physical Exam  General:  alert, well-developed, and well-nourished.   Head:  normocephalic and atraumatic.   Lungs:  normal respiratory effort, no accessory muscle use, and normal breath sounds.   Heart:  normal rate and regular rhythm.   Pulses:  R radial normal.   Extremities:  2+ pitting edema upto knees, and patient reports that his legs "whole lot better than before" Neurologic:  alert & oriented X3 and gait normal.     Impression & Recommendations:  Problem # 1:  DIABETES MELLITUS, TYPE II, UNCONTROLLED (ICD-250.02)  Uncontrolled. Patient's CBG's are all over the places ranging anywhere from low 200's to 400's range. Patient checks his blood sugars at 10 AM before eating breakfast, at 3PM before his lunch, at 8 PM before his dinner. Before breakfast (at 10 AM) all his blood sugars are more than 200 and a few values are in 300's (811-914). Before lunch (at 3 PM) all his numbers are all over the place ranging anywhere from 186 to 433. At 8 PM before his dinner, again his numbers are all over the places, ranging anywhere from 172- 433. Suspect Lantus + SSI combination would be a better choice than three times 70/30. Will refer him to Endocrinology for further recommendations. Will increase his Insulin to 63, 63, 68. Unfortunately, because of his renal insuffiency, Metformin is not an option for him.  His updated medication list for this problem includes:    Glipizide 5 Mg Tabs (Glipizide) .Marland Kitchen... Take 2 tablets by mouth each morning and 2 tablets each afternoon.    Novolog Mix 70/30 Flexpen 70-30 % Susp (Insulin aspart prot & aspart) ..... Inject 63 units subcutaneously before breakfast, 63 units  before lunch, and 68 units before supper  Labs Reviewed: Creat: 1.47 (11/12/2009)     Last Eye Exam: Background diabetic retinopathy. Exam by Beulah Gandy. Ashley Royalty, MD (08/29/2009) Reviewed HgBA1c results: 11.0 (10/16/2009)  10.8 (08/28/2009)  Orders: Endocrinology Referral (Endocrine)  Problem # 2:  EDEMA (ICD-782.3)  Edema is improving and his weight also dropped few pounds since his last office visit.  His  updated medication list for this problem includes:    Furosemide 20 Mg Tabs (Furosemide) .Marland Kitchen... Take 2 tablets by mouth three times a day  Discussed elevation of the legs, use of compression stockings, sodium restiction, and medication use.   Orders: T-Basic Metabolic Panel [phone])  Complete Medication List: 1)  Glipizide 5 Mg Tabs (Glipizide) .... Take 2 tablets by mouth each morning and 2 tablets each afternoon. 2)  Hydrocodone-acetaminophen 5-325 Mg Tabs (Hydrocodone-acetaminophen) .... Take 1and 1/2 tablets by mouth every 6 hours as needed for pain 3)  Amitriptyline Hcl 75 Mg Tabs (Amitriptyline hcl) .... Take 1 tablet by mouth once a day 4)  Viagra 50 Mg Tabs (Sildenafil citrate) .... Take one tablet by mouth 1 hour before sexual activity.  do not take more than 1 per day. 5)  Lipitor 20 Mg Tabs (Atorvastatin calcium) .... Take 1 tablet by mouth once a day 6)  Novolog Mix 70/30 Flexpen 70-30 % Susp (Insulin aspart prot & aspart) .... Inject 63 units subcutaneously before breakfast, 63 units before lunch, and 68 units before supper 7)  Prilosec 20 Mg Cpdr (Omeprazole) .... Take 1 tablet by mouth once a day 8)  Truetrack Test Strp (Glucose blood) .... Use to test blood sugar before meals and at bedtime. 9)  Prodigy Twist Top Lancets 28g Misc (Lancets) .... Use to test blood sugars before meals and at bedtime each day 10)  Prodigy Insulin Syringe 28g X 1/2" 1 Ml Misc (Insulin syringe-needle u-100) .... Use to injection insulin twice daily 11)  Furosemide 20 Mg Tabs  (Furosemide) .... Take 2 tablets by mouth three times a day 12)  Prodigy Short Pen Needles 31g X 8 Mm Misc (Insulin pen needle) .... Use as directed for insulin injection two times a day 13)  Prodigy Insulin Syringe 28g X 1/2" 1 Ml Misc (Insulin syringe-needle u-100) .... Use to inject insuln twice a day 14)  Walker Misc (Misc. devices) .... Use as directed.  Patient Instructions: 1)  Please schedule a follow-up appointment in 2 weeks. 2)  Start using the Lasix 20 mg, 2 tablets three times a day. 3)  Take the Insulin as instructed below. 4)  Check your blood sugars regularly. If your readings are usually above : 300 or below 70 you should contact our office.  Process Orders Check Orders Results:     [facility]: ABN not required for this insurance Tests Sent for requisitioning (Nov 28, 2009 9:23 AM):     11/28/2009: [facility] -- T-Basic Metabolic Panel [phone] (signed)    Prevention & Chronic Care Immunizations   Influenza vaccine: Fluvax 3+  (05/03/2009)   Influenza vaccine deferral: Not available  (02/13/2009)   Influenza vaccine due: 03/13/2010    Tetanus booster: 02/20/2009: Td   Tetanus booster due: 02/21/2019    Pneumococcal vaccine: Pneumovax  (08/28/2009)   Pneumococcal vaccine due: 03/04/2015  Colorectal Screening   Hemoccult: negative x 3  (07/04/2008)   Hemoccult action/deferral: Deferred  (10/30/2009)   Hemoccult due: 07/2009    Colonoscopy: (1) Diverticula (2) Medium hemorrhoids Exam by Dr. Jordan Hawks. Hung  (09/27/2008)   Colonoscopy due: 10/2018  Other Screening   PSA: 0.58  (08/28/2009)   PSA action/deferral: Discussed-PSA requested  (08/28/2009)   Smoking status: quit  (11/28/2009)  Diabetes Mellitus   HgbA1C: 11.0  (10/16/2009)   HgbA1C action/deferral: Ordered  (02/13/2009)   Hemoglobin A1C due: 11/12/2007    Eye exam: Background diabetic retinopathy. Exam by Beulah Gandy. Ashley Royalty, MD  (  08/29/2009)   Diabetic  eye exam action/deferral: Ophthalmology referral  (02/20/2009)   Eye exam due: 02/26/2009    Foot exam: yes  (08/28/2009)   Foot exam action/deferral: Do today   High risk foot: Yes  (08/28/2009)   Foot care education: Done  (08/28/2009)   Foot exam due: 11/25/2009    Urine microalbumin/creatinine ratio: 4.6  (11/12/2009)   Urine microalbumin action/deferral: Ordered   Urine microalbumin/cr due: 12/06/2008  Lipids   Total Cholesterol: 138  (11/12/2009)   Lipid panel action/deferral: Lipid Panel ordered   LDL: 61  (11/12/2009)   LDL Direct: Not documented   HDL: 32  (11/12/2009)   Triglycerides: 226  (11/12/2009)   Lipid panel due: 03/15/2008    SGOT (AST): 25  (08/28/2009)   BMP action: Ordered   SGPT (ALT): 30  (08/28/2009)   Alkaline phosphatase: 141  (08/28/2009)   Total bilirubin: 0.6  (08/28/2009)  Self-Management Support :   Personal Goals (by the next clinic visit) :     Personal A1C goal: 7  (04/09/2009)     Personal blood pressure goal: 130/80  (04/09/2009)     Personal LDL goal: 100  (04/09/2009)     Home glucose monitoring frequency: 3 times a day  (06/04/2009)    Diabetes self-management support: CBG self-monitoring log, Written self-care plan, Resources for patients handout  (11/12/2009)   Last diabetes self-management training by diabetes educator: 09/25/2009   Last medical nutrition therapy: 06/28/2008    Lipid self-management support: Written self-care plan, Resources for patients handout  (11/12/2009)

## 2010-08-12 NOTE — Progress Notes (Signed)
Summary: diabetes support/dmr  Phone Note Call from Patient   Caller: Patient Summary of Call: patient left message requesting a return call.  got insulin from [facility] want ot know since doses are larger than 60 units nad pen only go up to 60 if there is apen that allows larger than 60 dose so he doesn't  have to give two injections. I answered: no insulin mixture pens that I am aware of allow for larger than 60 unit doses.  we also reschedule 3/23/ appointment to reveiw blood sugars patterns, food choices, insulin doses. Initial call taken by: Jamison Neighbor RD,CDE,  September 24, 2009 4:41 PM

## 2010-08-12 NOTE — Progress Notes (Signed)
Summary: med refill/gp  Phone Note Refill Request Message from:  Fax from Pharmacy on August 14, 2009 10:07 AM  Refills Requested: Medication #1:  AMITRIPTYLINE HCL 75 MG  TABS Take 1 tablet by mouth once a day   Last Refilled: 07/18/2009 Last appt. 07/18/09; next appt.08/28/09.   Method Requested: Electronic Initial call taken by: Chinita Pester RN,  August 14, 2009 10:07 AM  Follow-up for Phone Call        This medication was prescribed 12/27/2008 with 11 addition refills - there should be refills available.  Please call the pharmacy and find out why these are not on file. Follow-up by: Margarito Liner MD,  August 14, 2009 5:38 PM  Additional Follow-up for Phone Call Additional follow up Details #1::        [facility] said they did  not receive the refill order.  Verbal order given  to the pharmacist until May. It could had been called to [facility] instead. pt. has Medicaid now. Additional Follow-up by: Chinita Pester RN,  August 15, 2009 2:14 PM

## 2010-08-12 NOTE — Progress Notes (Signed)
Summary: almost out of all meds/lost insurance/dmr  Phone Note Call from Patient Call back at Home Phone [phone]   Caller: Patient Summary of Call: Patient left message on voicemail: please call [phone]- almostyout of all his meds, but mostly concerned about insulin: feurosimide, lipitor omeprazole, new pill- gabapentin, 100 units levemir/day and 30 units pre meal novolog plus correction- Dr. Daune Perch office gave him sample pens x 2, but they are almost gone He lost his Medicaid, cannot get Medicare until october, is over qualified for [facility]. wants to know if he can go back to using the Nvolog Mix 70/30. I told him he shoul dnot use Novolog Mix 70/30 with the levemir in place of hte Novolog. I suggested he call Dr. Daune Perch office for instructions on using it alone instead of both the Novolog and levemir and tell them he cannot afford his insulin .   Will consult with Dr. Meredith Pel about other medicatiosn, to be sure he agrees with the above and to be sure he is aware patient lost insurance. PLease advise.    Initial call taken by: Jamison Neighbor RD,CDE,  January 21, 2010 4:05 PM  Follow-up for Phone Call        I agree with the advice to call Dr. Sharl Ma regarding the insulin. Regarding his other meds, please ask for help from social work and Artist.  It would be good to confirm that patient's understanding of his options is correct. Follow-up by: Margarito Liner MD,  January 22, 2010 9:59 AM  Additional Follow-up for Phone Call Additional follow up Details #1::        Called the patient who told me that his income was $1,108 in disablity per month.  The patient said that he had not recently met with [facility] for intake.   I advised him to go down to MAP and see if they will help him because his disability income is less than 200% of poverty level.   However DEb Hill tells me he also recd a lump sum payment of 15,000 which they may or may not ask about.   We will see how he does at MAP program.   If that  does not work out then [employer] will likely be best option.  SW will follow with patient to see how he did.  Dorothe Pea  January 22, 2010 12:35 PM

## 2010-08-12 NOTE — Assessment & Plan Note (Signed)
Summary: FU VISIT PER DR. JOINES/DM/DS   Vital Signs:  Patient profile:   60 year old male Height:      78 inches Weight:      351.7 pounds BMI:     40.79 Temp:     97.1 degrees F oral Pulse rate:   96 / minute BP sitting:   135 / 78  (right arm)  Vitals Entered By: Filomena Jungling NT II (October 30, 2009 9:02 AM) CC: followup visit Is Patient Diabetic? Yes Did you bring your meter with you today? Yes Pain Assessment Patient in pain? yes     Location: feet Intensity: 8 Type: aching Onset of pain  3-4 weeks Nutritional Status BMI of > 30 = obese CBG Result 185  Have you ever been in a relationship where you felt threatened, hurt or afraid?No   Does patient need assistance? Functional Status Self care Ambulation Normal   Primary Care Provider:  Margarito Liner MD  CC:  followup visit.  History of Present Illness: Patient presents for followup of his poorly controlled diabetes mellitus and lower extremity edema.  He is currently following at the wound clinic for care of an ulcer on the anterior sole of his left foot near the base of his second toe and on the sole of his right heel.  He has been doing well keeping his appointments; his next appointment with wound care is friday of next week.  His blood sugars have improved slightly following the increase to his Insulin - he is using 55 units in the morning, 60 at lunch, and 60 at dinner.  His CBGs are now averaging in the low 200s.  He denies any episode of hypoglycemia.  He also reports continued lower extremity edema.  He has been taking 2 of his Lasix pills as directed with mild improvement in his symptoms but no resolution.  Over the past 2 weeks, he has not been keeping his legs elevated; however, he was intructed to by his wound care RN yesterday, after which he kept his foot propped up with pillows with noticable improvement of his symptoms.  He also purchased a compression stocking but the size is too small.  He plans to exchange  the stocking later this week for the appropriate size.  He denies dizziness, H/A, and syncope.  He has no additional concerns or complaints.  Current Problems (verified): 1)  Diabetic Foot Ulcer  (ICD-250.80) 2)  Dm W/complication Nos, Type II  (ICD-250.90) 3)  Diabetes Mellitus, Type II, Uncontrolled  (ICD-250.02) 4)  Diabetic Macular Edema  (ICD-362.07) 5)  Background Diabetic Retinopathy  (ICD-362.01) 6)  Peripheral Neuropathy, Lower Extremities, Bilateral  (ICD-355.8) 7)  Foot Pain  (ICD-729.5) 8)  Callus, Toe  (ICD-700) 9)  Callus, Foot  (ICD-700) 10)  Hyperlipidemia  (ICD-272.2) 11)  Renal Insufficiency, Chronic  (ICD-585.9) 12)  Gerd  (ICD-530.81) 13)  Hearing Loss, Sensorineural, Bilateral  (ICD-389.10) 14)  Erectile Dysfunction  (ICD-302.72) 15)  Skin Lesion  (ICD-709.9) 16)  Dizziness  (ICD-780.4) 17)  Tinnitus Nos  (ICD-388.30) 18)  Weight Gain  (ICD-783.1) 19)  Tobacco User  (ICD-305.1) 20)  Osteomyelitis Nos, Ankle/foot  (ICD-730.27) 21)  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 Take 2 Tablets By Mouth Each Morning and 2 Tablets Each Afternoon. 2)  Hydrocodone-Acetaminophen 5-325 Mg Tabs (Hydrocodone-Acetaminophen) .... Take 1and 1/2 Tablets  By Mouth Every 6 Hours As Needed For Pain 3)  Amitriptyline Hcl 75 Mg  Tabs (Amitriptyline Hcl) .... Take 1 Tablet By Mouth Once A Day 4)  Viagra 50 Mg Tabs (Sildenafil Citrate) .... Take One Tablet By Mouth 1 Hour Before Sexual Activity.  Do Not Take More Than 1 Per Day. 5)  Lipitor 20 Mg  Tabs (Atorvastatin Calcium) .... Take 1 Tablet By Mouth Once A Day 6)  Novolog Mix 70/30 Flexpen 70-30 % Susp (Insulin Aspart Prot & Aspart) .... Inject 55 Units Subcutaneously Before Breakfast, 60 Units Before Lunch, and 60 Units Before Supper 7)  Prilosec 20 Mg Cpdr (Omeprazole) .... Take 1 Tablet  By Mouth Once A Day 8)  Truetrack Test  Strp (Glucose Blood) .... Use To Test Blood Sugar Before Meals and At Bedtime. 9)  Prodigy Twist Top Lancets 28g  Misc (Lancets) .... Use To Test Blood Sugars Before Meals and At Bedtime Each Day 10)  Prodigy Insulin Syringe 28g X 1/2" 1 Ml Misc (Insulin Syringe-Needle U-100) .... Use To Injection Insulin Twice Daily 11)  Furosemide 20 Mg Tabs (Furosemide) .... Take 2 Tablets By Mouth Once A Day 12)  Prodigy Short Pen Needles 31g X 8 Mm Misc (Insulin Pen Needle) .... Use As Directed For Insulin Injection Two Times A Day 13)  Prodigy Insulin Syringe 28g X 1/2" 1 Ml Misc (Insulin Syringe-Needle U-100) .... Use To Inject Insuln Twice A Day  Allergies (verified): 1)  ! Vancomycin  Past History:  Past medical, surgical, family and social histories (including risk factors) reviewed, and no changes noted (except as noted below).  Past Medical History: Reviewed history from 04/09/2009 and no changes required. 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 No colon cancer. Father had MI at age  5. Mother had DM; 2 siblings have DM. No prostate cancer.  Social History: Reviewed history from 05/03/2009 and no changes required. Married; no children from current marriage. Unemployed- not looking for work. Signed up for disability - has not got it yet.  Review of Systems General:  Denies chills.  Physical Exam  General:  alert, no distress, well-hydrated.   Head:  Normocephalic, atraumatic Mouth:  pharynx pink and moist, no erythema, and no exudates.   Neck:  supple and no masses.   Lungs:  normal respiratory effort, normal breath sounds Heart:  normal rate, regular rhythm, no murmur, no gallop, and no rub.   Extremities:  2+ bilateral lower extremity edema.  Left leg is in a hard cast.   Neurologic:  alert & oriented X3.   Skin:  turgor normal and color normal.   Psych:  Oriented X3, memory intact for recent and remote, normally interactive, good eye contact, not anxious appearing, and not depressed appearing.     Impression & Recommendations:  Problem # 1:  DIABETIC FOOT ULCER (ICD-250.80) Pt is  doing well without signs or symptoms of systemic infection.  He is currently following with wound clinic for care of his bilateral foot ulcers.    His updated medication list for this problem includes:    Glipizide 5 Mg Tabs (Glipizide) .Marland Kitchen... Take 2 tablets by mouth each morning and 2 tablets each afternoon.    Novolog Mix 70/30 Flexpen 70-30 % Susp (Insulin aspart prot & aspart) ..... Inject 55 units subcutaneously before breakfast, 60 units before lunch, and 60 units before supper  Problem # 2:  DIABETES MELLITUS, TYPE II, UNCONTROLLED (ICD-250.02) CBGs are slowly improving following increase of Insulin last week.  Ecnouraged pt to continue regular CBG monitoring and continued adherence to medication plan.  Pt will come back in 1 month for review of CBGs and adjustment of insulin dosing if needed.  Advised him to call the clinic if her experienced any hypoglycemic episodes or  other concerning symptoms.  His updated medication list for this problem includes:    Glipizide 5 Mg Tabs (Glipizide) .Marland Kitchen... Take 2 tablets by mouth each morning and 2 tablets each afternoon.    Novolog Mix 70/30 Flexpen 70-30 % Susp (Insulin aspart prot & aspart) ..... Inject 55 units subcutaneously before breakfast, 60 units before lunch, and 60 units before supper  Problem # 3:  EDEMA (ICD-782.3) Pt with continued lower extremity edema.  I feel that some degree of pts symptoms are the result of dependent edema.  Ecnourage pt to keep his legs elevated as much as possible while at home and to obtain appropriately sized compression stocking soon.  He is tolerating the increased Lasix dose well; will continue this for now.  Will f/u symptoms at his next appointment.  His updated medication list for this problem includes:    Furosemide 20 Mg Tabs (Furosemide) .Marland Kitchen... Take 2 tablets by mouth once a day  Complete Medication List: 1)  Glipizide 5 Mg Tabs (Glipizide) .... Take 2 tablets by mouth each morning and 2 tablets each afternoon. 2)  Hydrocodone-acetaminophen 5-325 Mg Tabs (Hydrocodone-acetaminophen) .... Take 1and 1/2 tablets by mouth every 6 hours as needed for pain 3)  Amitriptyline Hcl 75 Mg Tabs (Amitriptyline hcl) .... Take 1 tablet by mouth once a day 4)  Viagra 50 Mg Tabs (Sildenafil citrate) .... Take one tablet by mouth 1 hour before sexual activity.  do not take more than 1 per day. 5)  Lipitor 20 Mg Tabs (Atorvastatin calcium) .... Take 1 tablet by mouth once a day 6)  Novolog Mix 70/30 Flexpen 70-30 % Susp (Insulin aspart prot & aspart) .... Inject 55 units subcutaneously before breakfast, 60 units before lunch, and 60 units before supper 7)  Prilosec 20 Mg Cpdr (Omeprazole) .... Take 1 tablet by mouth once a day 8)  Truetrack Test Strp (Glucose blood) .... Use to test blood sugar before meals and at bedtime. 9)  Prodigy Twist Top Lancets 28g Misc (Lancets) .... Use to test blood  sugars before meals and at bedtime each day 10)  Prodigy Insulin Syringe 28g X 1/2" 1 Ml Misc (Insulin syringe-needle u-100) .... Use to injection insulin twice daily 11)  Furosemide 20 Mg Tabs (Furosemide) .... Take 2 tablets by mouth once a day 12)  Prodigy Short Pen Needles 31g X 8 Mm Misc (Insulin pen needle) .... Use as directed for insulin injection two times a day 13)  Prodigy Insulin Syringe 28g X 1/2" 1 Ml Misc (Insulin syringe-needle u-100) .... Use to inject insuln twice a day  Patient  Instructions: 1)  Please schedule a follow-up appointment in 1 month or sooner if needed. 2)  Keep your legs elevated as much as possible when you are sitting and resting at home. 3)  Try to get your compression stocking exchanged for the right size soon.  This will help improve the swelling in your leg. 4)  Keep your appointment at the wound clinic. 5)  Keep checking your blood sugars regularly and taking your Insulin as directed - you are doing well!  Prevention & Chronic Care Immunizations   Influenza vaccine: Fluvax 3+  (05/03/2009)   Influenza vaccine deferral: Not available  (02/13/2009)   Influenza vaccine due: 03/13/2010    Tetanus booster: 02/20/2009: Td   Tetanus booster due: 02/21/2019    Pneumococcal vaccine: Pneumovax  (08/28/2009)   Pneumococcal vaccine due: 03/04/2015  Colorectal Screening   Hemoccult: negative x 3  (07/04/2008)   Hemoccult action/deferral: Deferred  (10/30/2009)   Hemoccult due: 07/2009    Colonoscopy: (1) Diverticula (2) Medium hemorrhoids Exam by Dr. Jordan Hawks. Hung  (09/27/2008)   Colonoscopy due: 10/2018  Other Screening   PSA: 0.58  (08/28/2009)   PSA action/deferral: Discussed-PSA requested  (08/28/2009)   Smoking status: quit  (10/16/2009)  Diabetes Mellitus   HgbA1C: 11.0  (10/16/2009)   HgbA1C action/deferral: Ordered  (02/13/2009)   Hemoglobin A1C due: 11/12/2007    Eye exam: Background diabetic retinopathy. Exam by [facility]  (02/27/2008)   Diabetic eye exam action/deferral: Ophthalmology referral  (02/20/2009)   Eye exam due: 02/26/2009    Foot exam: yes  (08/28/2009)   Foot exam action/deferral: Do today   High risk foot: Yes  (08/28/2009)   Foot care education: Done  (08/28/2009)   Foot exam due: 11/25/2009    Urine microalbumin/creatinine ratio: 8.5  (02/20/2009)   Urine microalbumin action/deferral: Ordered   Urine microalbumin/cr due: 12/06/2008    Diabetes flowsheet reviewed?: Yes   Progress toward A1C goal: Unchanged  Lipids   Total Cholesterol: 132  (06/04/2009)   Lipid panel action/deferral: Lipid Panel ordered   LDL: 65  (06/04/2009)   LDL Direct: Not documented   HDL: 28  (06/04/2009)   Triglycerides: 193  (06/04/2009)   Lipid panel due: 03/15/2008    SGOT (AST): 25  (08/28/2009)   BMP action: Ordered   SGPT (ALT): 30  (08/28/2009)   Alkaline phosphatase: 141  (08/28/2009)   Total bilirubin: 0.6  (08/28/2009)    Lipid flowsheet reviewed?: Yes   Progress toward LDL goal: At goal  Self-Management Support :   Personal Goals (by the next clinic visit) :     Personal A1C goal: 7  (04/09/2009)     Personal blood pressure goal: 130/80  (04/09/2009)     Personal LDL goal: 100  (04/09/2009)    Patient will work on the following items until the next clinic visit to reach self-care goals:     Medications and monitoring: take my medicines every day, check my blood sugar  (10/30/2009)     Eating: drink diet soda or water instead of juice or soda, eat more vegetables, use fresh or frozen vegetables, eat foods that are low in salt, eat baked foods instead of fried foods, eat fruit for snacks and desserts  (10/30/2009)     Activity: take a 30 minute walk every day  (10/30/2009)     Home glucose monitoring frequency: 3 times a day  (06/04/2009)    Diabetes self-management support: Written self-care plan  (10/16/2009)  Last diabetes self-management training by diabetes educator:  09/25/2009   Last medical nutrition therapy: 06/28/2008    Lipid self-management support: Written self-care plan  (10/16/2009)

## 2010-08-12 NOTE — Progress Notes (Signed)
Summary: refill/ hla  Phone Note Refill Request Message from:  Patient on January 31, 2010 2:50 PM  Refills Requested: Medication #1:  HYDROCODONE-ACETAMINOPHEN 5-325 MG TABS Take 1and 1/2 tablets by mouth every 6 hours as needed for pain   Dosage confirmed as above?Dosage Confirmed   Last Refilled: 6/22 disch from hosp 01/29/2010  Initial call taken by: Marin Roberts RN,  January 31, 2010 2:50 PM  Follow-up for Phone Call        should have refills? 6/22 script given with #2 refills. Follow-up by: Julaine Fusi  DO,  January 31, 2010 3:39 PM  Additional Follow-up for Phone Call Additional follow up Details #1::        Call to [facility] pt's refills for the Vicodin has been transferred to the [facility] on [address].  Call to [facility] pt's prescription for the Vicodin is ready for pick up today.  Pt was called and informed of.  Pt was asked if he would be interested in having his medication delivered to his home.  Pt siad that he would like to have this since his does not have transportation and has to walk to the Drug Store to get his medications.  Information to be sent to [facility] for Home delivery of pt's medications.  Additional Follow-up by: Angelina Ok RN,  January 31, 2010 4:48 PM

## 2010-08-12 NOTE — Letter (Signed)
Summary: [facility]   Imported By: Margie Billet 10/28/2009 15:49:02  _____________________________________________________________________  External Attachment:    Type:   Image     Comment:   External Document

## 2010-08-12 NOTE — Progress Notes (Signed)
Summary: Soc. 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 Work Call placed to: Patient Summary of Call: Checked with pt re:  [facility] f/u.  He said he will try and get over by midweek.  He has to go to the wound center for his leg.    Follow-up for Phone Call        He has an appmt with [facility] on Aug. 23rd.  Dorothe Pea  February 17, 2010 12:05 PM

## 2010-08-12 NOTE — Progress Notes (Signed)
Summary: out of insulin/dmr  Phone Note Call from Patient Call back at Home Phone [phone]   Caller: Patient Summary of Call: patient called to say he would get here for his 11:30 appointment wth CDE, has been out of both Levemir and Novolog insulins for a while. He had called dr. Daune Perch office and they told him he could come get a vial of Novolog. but he had no way of getting there to get it. ( walked the last time and got dehydrated and was told not to do that anymore).   Will discuss with attending physican to see if we can provide him with samples of Novolog and what to do about his lack of Levemir( ? change to lantus?) Initial call taken by: Jamison Neighbor RD,CDE,  March 10, 2010 8:40 AM  Follow-up for Phone Call        You can switch to Lantus and give a sample if we have it. I sent flag to front desk to update his insurance status. Send me note or order and I will sign. If Dr Meredith Pel responds, pls take his rec's over mine since he is PCP. Thanks. Follow-up by: Blanch Media MD,  March 10, 2010 8:56 AM  Additional Follow-up for Phone Call Additional follow up Details #1::        [name] asked me to check [name]'s foot. There is a large callous on L plantar surface over MCP area, with a fissure about 2 cm that is about 2 mm 3 mm deep at base of third toe that was amputated. Pt has drainage on sock. No pain, no odor, no gross infection. Talked to Clydie Braun at wound care who said there was a callous at D/C but no fissure. She suggested a visit so they can evaluate. I called pt and told him to call the wound care center to make appt. He will call today and make appt

## 2010-08-12 NOTE — Miscellaneous (Signed)
  Clinical Lists Changes  Orders: Added new Referral order of Podiatry Referral (Podiatry) - Signed

## 2010-08-12 NOTE — Assessment & Plan Note (Signed)
Summary: Soc. Work  60 minutes. Soc. Work.  Met with patient to reassess need for SCAT.  [name] is once again suffering with recurrent foot ulcer on his left foot and he has a cast which may or may not come off tomorrow.   We went over the SCAT application and he should qualify based on his recurrent diabetic foot ulcers and severe peripheral neuropathy left foot.  He uses a cane, has balance and dizziness issues, morbid obesity and reports falls in his home environment.      The remainder of the visit was spent coordinating a visit by [employer] rep to get [name] a new cane as the one he is using is way too short.  The [employer] rep [name] came and gave [name] a new cane free of charge since he has Medicare A only at this point.    The plan is to follow on SCAT application and MQB thru DSS so [name] can afford his Part B premiums.

## 2010-08-12 NOTE — Progress Notes (Signed)
Summary: refill/gg  Phone Note Refill Request  on January 01, 2010 4:34 PM  Refills Requested: Medication #1:  HYDROCODONE-ACETAMINOPHEN 5-325 MG TABS Take 1and 1/2 tablets by mouth every 6 hours as needed for pain   Last Refilled: 12/03/2009  Method Requested: Fax to Local Pharmacy Initial call taken by: Merrie Roof RN,  January 01, 2010 4:34 PM  Follow-up for Phone Call        I gave this prescription to patient during office visit on 6/22 Follow-up by: Margarito Liner MD,  January 03, 2010 6:29 PM

## 2010-08-12 NOTE — Progress Notes (Signed)
Summary: out of pain medicine/dmr  Phone Note Outgoing Call   Call placed by: Jamison Neighbor RD,CDE,  November 04, 2009 3:23 PM Summary of Call: returned patient call:he [facility], has everything except hydrocodone. he didn;t know that when he got tooth pulled on the 19th - dentist ga him  a prescription to get 12 hydrocodone that that would mess up his regular prescription for hydrocodone that he is suppoesd to get eeyr nont nthe 25th. Now [facility] says he needs a new prescription for more hydrocodone. He is currenlty out of hydrocodone altogether.  will route this message to [facility] attending since Dr. Meredith Pel is out of hte office and  triage nurse.  Follow-up for Phone Call        [facility] called pharmacy to verify details.  Ran name through [place] narc database to verify story.  Details fit.  Only other doctor Rxing narcs is dentist.  Will refill. Follow-up by: Blanch Media MD,  November 04, 2009 3:41 PM  Additional Follow-up for Phone Call Additional follow up Details #1::        Phone call completed, [facility], [place] database to mr for scan Additional Follow-up by: Marin Roberts RN,  November 05, 2009 12:17 PM    Prescriptions: HYDROCODONE-ACETAMINOPHEN 5-325 MG TABS (HYDROCODONE-ACETAMINOPHEN) Take 1and 1/2 tablets by mouth every 6 hours as needed for pain  #180 x 2   Entered and Authorized by:   Blanch Media MD   Signed by:   Blanch Media MD on 11/04/2009   Method used:   Telephoned to ...       [facility] (retail)       [address]       [place], [place]  [address]       Ph: [phone]       Fax: [phone]   RxID:   [id number]

## 2010-08-12 NOTE — Assessment & Plan Note (Signed)
Summary: EST-2 MONTH F/U VISIT/CH   Vital Signs:  Patient profile:   60 year old male Height:      78 inches Weight:      358.6 pounds BMI:     41.59 Temp:     97.1 degrees F oral Pulse (ortho):   88 / minute BP standing:   150 / 90  Vitals Entered By: Filomena Jungling NT II (April 22, 2010 4:52 PM)  Serial Vital Signs/Assessments:  Time      Position  BP       Pulse  Resp  Temp     By 4:54 PM   Lying RA  120/80   86                    Mamie Hague NT II 4:54 PM   Sitting   130/90   88                    Mamie Hague NT II 4:54 PM   Standing  150/90   88                    Mamie Hague NT II  CC: DIZZINESS X 2 WEEKS/SAMPLES OF PENS Is Patient Diabetic? Yes Did you bring your meter with you today? Yes Pain Assessment Patient in pain? yes     Location: left foot Intensity: 9 Type: aching Onset of pain  Chronic Nutritional Status BMI of > 30 = obese CBG Result 63  Have you ever been in a relationship where you felt threatened, hurt or afraid?No   Does patient need assistance? Functional Status Self care Ambulation Normal Comments blood sugar 60 in lab. Pt given Dex 4 fast acting glucose tabs (4 Gm of carb per tab) X 4 tabs and states he will eat supper as soon as he leaves. Dorie Rank, RN  April 22, 2010 5:21 PM    Primary Care Provider:  Margarito Liner MD  CC:  DIZZINESS X 2 WEEKS/SAMPLES OF PENS.  History of Present Illness: Patient returns for followup of his diabetes mellitus, hyperlipidemia, and other chronic medical problems. He also complains of dizziness for the past couple of weeks that occurs whenever he stands up rapidly from a sitting position. He denies syncope or near-syncope. He reports that he has had difficulty obtaining his insulin and therefore has missed some doses; he currently does not have Medicaid. He has gotten some assistance from the county pharmacy with his insulin but this does not appear to be a steady supply.  Preventive Screening-Counseling  & Management  Alcohol-Tobacco     Alcohol drinks/day: 0     Alcohol type: An occasional beer; no regular use     Smoking Status: quit     Smoking Cessation Counseling: yes     Packs/Day: 1/20     Year Started: 1970     Cans of tobacco/week: no  Caffeine-Diet-Exercise     Does Patient Exercise: yes     Type of exercise: WALKING     Times/week: 7  Current Medications (verified): 1)  Hydrocodone-Acetaminophen 5-325 Mg Tabs (Hydrocodone-Acetaminophen) .... Take 1and 1/2 Tablets By Mouth Every 6 Hours As Needed For Pain 2)  Amitriptyline Hcl 75 Mg  Tabs (Amitriptyline Hcl) .... Take 1 Tablet By Mouth Once A Day 3)  Viagra 50 Mg Tabs (Sildenafil Citrate) .... Take One Tablet By Mouth 1 Hour Before Sexual Activity.  Do Not Take More Than 1 Per Day. 4)  Lipitor 20 Mg  Tabs (Atorvastatin Calcium) .... Take 1 Tablet By Mouth Once A Day 5)  Prilosec 20 Mg Cpdr (Omeprazole) .... Take 1 Tablet By Mouth Once A Day 6)  Truetrack Test  Strp (Glucose Blood) .... Use To Test Blood Sugar Before Meals and At Bedtime. 7)  Prodigy Twist Top Lancets 28g  Misc (Lancets) .... Use To Test Blood Sugars Before Meals and At Bedtime Each Day 8)  Bd Insulin Syringe Ultrafine 31g X 5/16" 1 Ml Misc (Insulin Syringe-Needle U-100) .... Use As Directed For Once Daily Insulin Injection 9)  Prodigy Short Pen Needles 31g X 8 Mm Misc (Insulin Pen Needle) .... Use As Directed For Insulin Injection Two Times A Day 10)  Walker  Misc (Misc. Devices) .... Use As Directed. 11)  Neurontin 100 Mg Caps (Gabapentin) .... Take 1 Tablet By Mouth Three Times A Day 12)  Aspir-Low 81 Mg Tbec (Aspirin) .... Take 1 Tablet By Mouth Once A Day 13)  Levemir 100 Unit/ml Soln (Insulin Detemir) .... Inject 100 Units Subcutaneously Once A Day Each Morning 14)  Novolog Flexpen 100 Unit/ml Soln (Insulin Aspart) .... Inject 40 Units Before Breakfast, 50 Units Before Lunch, and 50 Units Before Dinner Plus 1 Unit For Every 10 Mg/dl Above 841 Mg/dl Three  Times A Day At Meals 15)  Lasix 20 Mg Tabs (Furosemide) .... Take 2 Tablets By Mouth Two Times A Day  Allergies (verified): 1)  ! Vancomycin  Review of Systems General:  Denies chills, fever, sweats, and weakness. CV:  Complains of swelling of feet; denies chest pain or discomfort, difficulty breathing at night, and fainting. GI:  Denies abdominal pain, nausea, and vomiting. Neuro:  Denies inability to speak and weakness.  Physical Exam  General:  alert, no distress Lungs:  normal respiratory effort, normal breath sounds, no crackles, and no wheezes.   Heart:  normal rate, regular rhythm, no murmur, no gallop, and no rub.   Abdomen:  soft, non-tender, and normal bowel sounds.   Extremities:  1+ left pedal edema and 1+ right pedal edema.     Impression & Recommendations:  Problem # 1:  DIABETES MELLITUS, TYPE II, UNCONTROLLED (ICD-250.02) Patient's hemoglobin A1c has improved from 10.6 in July to 8.4 today. He still has difficulty obtaining a consistent supply of his insulin. I advised him to [facility] tomorrow morning and find out if his NovoLog insulin is in stock. I also advised that he followup with our diabetes educator Jamison Neighbor.  His updated medication list for this problem includes:    Aspir-low 81 Mg Tbec (Aspirin) .Marland Kitchen... Take 1 tablet by mouth once a day    Levemir 100 Unit/ml Soln (Insulin detemir) ..... Inject 100 units subcutaneously once a day each morning    Novolog Flexpen 100 Unit/ml Soln (Insulin aspart) ..... Inject 40 units before breakfast, 50 units before lunch, and 50 units before dinner plus 1 unit for every 10 mg/dl above 324 mg/dl three times a day at meals  Orders: T-Hgb A1C (in-house) ([id number]) T- Capillary Blood Glucose (53664)  Labs Reviewed: Creat: 1.42 (03/04/2010)     Last Eye Exam: Diabetic background retinopathy Cataracts Glaucoma suspect Hyperopia, astigmatism, presbyopia Exam by [name], DO  (12/23/2009) Reviewed HgBA1c results: 8.4 (04/22/2010)  10.6 (02/05/2010)  Problem # 2:  HYPERLIPIDEMIA (ICD-272.2) Patient is doing well on Lipitor with no apparent problems; his last LDL was at goal.  His updated medication list for this problem includes:  Lipitor 20 Mg Tabs (Atorvastatin calcium) .Marland Kitchen... Take 1 tablet by mouth once a day  Labs Reviewed: SGOT: 48 (03/04/2010)   SGPT: 72 (03/04/2010)   HDL:32 (11/12/2009), 28 (06/04/2009)  LDL:61 (11/12/2009), 65 (06/04/2009)  Chol:138 (11/12/2009), 132 (06/04/2009)  Trig:226 (11/12/2009), 193 (06/04/2009)  Problem # 3:  SLEEP APNEA, OBSTRUCTIVE (ICD-327.23) Patient reports that he is now using CPAP, and is having no problems with this.  Problem # 4:  GERD (ICD-530.81) Symptoms are well controlled on current dose of omeprazole.  His updated medication list for this problem includes:    Prilosec 20 Mg Cpdr (Omeprazole) .Marland Kitchen... Take 1 tablet by mouth once a day  Problem # 5:  RENAL INSUFFICIENCY, CHRONIC (ICD-585.9) Will check a metabolic panel today.  Labs Reviewed: BUN: 17 (03/04/2010)   Cr: 1.42 (03/04/2010)    Hgb: 12.2 (02/05/2010)   Hct: 38.2 (02/05/2010)   Ca++: 9.1 (03/04/2010)    TP: 7.0 (03/04/2010)   Alb: 4.1 (03/04/2010)  Problem # 6:  DIZZINESS (ICD-780.4) Patient's dizziness is episodic and occurs only when he stands up rapidly from a sitting position.  He has no vertigo and no associated symptoms. He is not orthostatic. The plan is to check labs as below; I also advised him to take his time standing up and make sure that he is steady on his feet. I advised him to let me know if his symptoms worsen.  Orders: T-CBC w/Diff ([id number]) T-TSH [id number])  Complete Medication List: 1)  Hydrocodone-acetaminophen 5-325 Mg Tabs (Hydrocodone-acetaminophen) .... Take 1and 1/2 tablets by mouth every 6 hours as needed for pain 2)  Amitriptyline Hcl 75 Mg Tabs (Amitriptyline hcl) .... Take 1 tablet by mouth once a day 3)   Viagra 50 Mg Tabs (Sildenafil citrate) .... Take one tablet by mouth 1 hour before sexual activity.  do not take more than 1 per day. 4)  Lipitor 20 Mg Tabs (Atorvastatin calcium) .... Take 1 tablet by mouth once a day 5)  Prilosec 20 Mg Cpdr (Omeprazole) .... Take 1 tablet by mouth once a day 6)  Truetrack Test Strp (Glucose blood) .... Use to test blood sugar before meals and at bedtime. 7)  Prodigy Twist Top Lancets 28g Misc (Lancets) .... Use to test blood sugars before meals and at bedtime each day 8)  Bd Insulin Syringe Ultrafine 31g X 5/16" 1 Ml Misc (Insulin syringe-needle u-100) .... Use as directed for once daily insulin injection 9)  Prodigy Short Pen Needles 31g X 8 Mm Misc (Insulin pen needle) .... Use as directed for insulin injection two times a day 10)  Walker Misc (Misc. devices) .... Use as directed. 11)  Neurontin 100 Mg Caps (Gabapentin) .... Take 1 tablet by mouth three times a day 12)  Aspir-low 81 Mg Tbec (Aspirin) .... Take 1 tablet by mouth once a day 13)  Levemir 100 Unit/ml Soln (Insulin detemir) .... Inject 100 units subcutaneously once a day each morning 14)  Novolog Flexpen 100 Unit/ml Soln (Insulin aspart) .... Inject 40 units before breakfast, 50 units before lunch, and 50 units before dinner plus 1 unit for every 10 mg/dl above 536 mg/dl three times a day at meals 15)  Lasix 20 Mg Tabs (Furosemide) .... Take 2 tablets by mouth two times a day  Other Orders: T-Comprehensive Metabolic Panel ([id number]) Influenza Vaccine NON MCR ([id number])  Patient Instructions: 1)  Please schedule a follow-up appointment in 2 months.  Laboratory Results   Blood Tests   Date/Time Received: April 22, 2010 4:42  PM  Date/Time Reported: Burke Keels  April 22, 2010 4:42 PM   HGBA1C: 8.4%   (Normal Range: Non-Diabetic - 3-6%   Control Diabetic - 6-8%) CBG Random:: 63mg /dL  Comments: Results of CBG given to [facility]. NT Burke Keels  April 22, 2010 4:42  PM     Prevention & Chronic Care Immunizations   Influenza vaccine: Fluvax Non-MCR  (04/22/2010)   Influenza vaccine deferral: Not available  (02/13/2009)   Influenza vaccine due: 03/13/2010    Tetanus booster: 02/20/2009: Td   Tetanus booster due: 02/21/2019    Pneumococcal vaccine: Pneumovax  (08/28/2009)   Pneumococcal vaccine due: 03/04/2015    H. zoster vaccine: Not documented  Colorectal Screening   Hemoccult: negative x 3  (07/04/2008)   Hemoccult action/deferral: Not indicated  (12/12/2009)   Hemoccult due: 07/2009    Colonoscopy: (1) Diverticula (2) Medium hemorrhoids Exam by Dr. Jordan Hawks. Hung  (09/27/2008)   Colonoscopy due: 10/2018  Other Screening   PSA: 0.58  (08/28/2009)   PSA action/deferral: Discussed-PSA requested  (08/28/2009)   Smoking status: quit  (04/22/2010)  Diabetes Mellitus   HgbA1C: 8.4  (04/22/2010)   HgbA1C action/deferral: Ordered  (02/13/2009)   Hemoglobin A1C due: 11/12/2007    Eye exam: Diabetic background retinopathy Cataracts Glaucoma suspect Hyperopia, astigmatism, presbyopia Exam by [name], DO  (12/23/2009)   Diabetic eye exam action/deferral: Ophthalmology referral  (02/20/2009)   Eye exam due: 03/2010    Foot exam: yes  (01/01/2010)   Foot exam action/deferral: Do today   High risk foot: Yes  (01/01/2010)   Foot care education: Done  (01/01/2010)   Foot exam due: 04/03/2010    Urine microalbumin/creatinine ratio: 4.6  (11/12/2009)   Urine microalbumin action/deferral: Ordered   Urine microalbumin/cr due: 12/06/2008    Diabetes flowsheet reviewed?: Yes   Progress toward A1C goal: Improved  Lipids   Total Cholesterol: 138  (11/12/2009)   Lipid panel action/deferral: Lipid Panel ordered   LDL: 61  (11/12/2009)   LDL Direct: Not documented   HDL: 32  (11/12/2009)   Triglycerides: 226  (11/12/2009)   Lipid panel due: 03/15/2008    SGOT (AST): 48  (03/04/2010)   BMP action: Ordered   SGPT (ALT):  72  (03/04/2010) CMP ordered    Alkaline phosphatase: 184  (03/04/2010)   Total bilirubin: 0.4  (03/04/2010)    Lipid flowsheet reviewed?: Yes   Progress toward LDL goal: At goal  Self-Management Support :   Personal Goals (by the next clinic visit) :     Personal A1C goal: 7  (04/09/2009)     Personal blood pressure goal: 130/80  (04/09/2009)     Personal LDL goal: 100  (04/09/2009)    Patient will work on the following items until the next clinic visit to reach self-care goals:     Medications and monitoring: take my medicines every day, check my blood sugar, bring all of my medications to every visit  (04/22/2010)     Eating: eat more vegetables, use fresh or frozen vegetables, eat foods that are low in salt, eat baked foods instead of fried foods, eat fruit for snacks and desserts, limit or avoid alcohol  (04/22/2010)     Activity: take a 30 minute walk every day, take the stairs instead of the elevator  (04/22/2010)     Home glucose monitoring frequency: 3 times a day  (06/04/2009)    Diabetes self-management support: Education handout, Written self-care plan  (04/22/2010)  Diabetes care plan printed   Diabetes education handout printed   Last diabetes self-management training by diabetes educator: 03/10/2010   Last medical nutrition therapy: 06/28/2008    Lipid self-management support: Education handout, Written self-care plan  (04/22/2010)   Lipid self-care plan printed.   Lipid education handout printed   Nursing Instructions: Give Flu vaccine today Diabetic foot exam today    Diabetic Foot Exam Foot Inspection Is there a history of a foot ulcer?              Yes Is there a foot ulcer now?              No Can the patient see the bottom of their feet?          No Are the shoes appropriate in style and fit?          Yes Is there swelling or an abnormal foot shape?          No Are the toenails long?                No Are the toenails ingrown?              No Is  there heavy callous build-up?              Yes Is there elevated skin temperature?            No  Diabetic Foot Care Education Pulse Check          Right Foot          Left Foot Posterior Tibial:        normal            normal Dorsalis Pedis:        normal            normal    Process Orders Check Orders Results:     [facility]: ABN not required for this insurance Tests Sent for requisitioning (April 25, 2010 6:30 PM):     04/22/2010: [facility] -- T-Comprehensive Metabolic Panel [[id number]] (signed)     04/22/2010: [facility] -- T-CBC w/Diff [[id number]] (signed)     04/22/2010: [facility] -- T-TSH [id number] (signed)      Influenza Vaccine    Vaccine Type: Fluvax Non-MCR    Site: left deltoid    Mfr: GlaxoSmithKline    Dose: 0.5 ml    Route: IM    Given by: Marin Roberts RN    Exp. Date: 01/10/2011    Lot #: [id number]    VIS given: 02/04/10 version given April 22, 2010.  Flu Vaccine Consent Questions    Do you have a history of severe allergic reactions to this vaccine? no    Any prior history of allergic reactions to egg and/or gelatin? no    Do you have a sensitivity to the preservative Thimersol? no    Do you have a past history of Guillan-Barre Syndrome? no    Do you currently have an acute febrile illness? no    Have you ever had a severe reaction to latex? no    Vaccine information given and explained to patient? yes .Marin Roberts RN  April 22, 2010 5:47 PM

## 2010-08-12 NOTE — Progress Notes (Signed)
Summary: refill/gg  Phone Note Refill Request  on Dec 04, 2009 4:36 PM  Refills Requested: Medication #1:  GLIPIZIDE 5 MG  TABS Take 2 tablets by mouth each morning and 2 tablets each afternoon. Initial call taken by: Merrie Roof RN,  Dec 04, 2009 4:36 PM  Follow-up for Phone Call        Refilled electronically.  Follow-up by: Margarito Liner MD,  Dec 04, 2009 4:52 PM    Prescriptions: GLIPIZIDE 5 MG  TABS (GLIPIZIDE) Take 2 tablets by mouth each morning and 2 tablets each afternoon.  #120 x 6   Entered and Authorized by:   Margarito Liner MD   Signed by:   Margarito Liner MD on 12/04/2009   Method used:   Electronically to        [facility]       [address]       [place], [place]  [address]       Ph: [phone]       Fax: [phone]   RxID:   [id number]

## 2010-08-12 NOTE — Progress Notes (Signed)
Summary: prior authorization-Viagra/gp  Phone Note Other Incoming   Summary of Call: Prior Authorization for Viagra 50mg  has been approved x1 year.  [facility] was called and made awared. Initial call taken by: Chinita Pester RN,  March 19, 2010 12:07 PM

## 2010-08-12 NOTE — Progress Notes (Signed)
Summary: pain meds  Phone Note Call from Patient   Caller: Patient Summary of Call: Pain medicine not due until the 26th,  wants to know if he can get before because everyone will be closed, but if he has to he can wait call back at [phone] if needed.  - note refill nurse has a document on her desktop already concerning this matter- will route to her.. Initial call taken by: Jamison Neighbor RD,CDE,  June 03, 2010 11:29 AM  Follow-up for Phone Call        Refill approved - nurse to complete. Follow-up by: Margarito Liner MD,  June 03, 2010 1:54 PM  Additional Follow-up for Phone Call Additional follow up Details #1::        called to pharm Additional Follow-up by: Marin Roberts RN,  June 03, 2010 2:06 PM    Prescriptions: HYDROCODONE-ACETAMINOPHEN 5-325 MG TABS (HYDROCODONE-ACETAMINOPHEN) Take 1and 1/2 tablets by mouth every 6 hours as needed for pain  #180 x 0   Entered and Authorized by:   Margarito Liner MD   Signed by:   Margarito Liner MD on 06/03/2010   Method used:   Telephoned to ...       [facility] [id number] (retail)       [address] [place], [place]  [address]       Ph: [phone]       Fax: [phone]   RxID:   [id number]

## 2010-08-12 NOTE — Assessment & Plan Note (Signed)
Summary: since tues, see note/pcp-joines/hla   Vital Signs:  Patient profile:   60 year old male Height:      78 inches (198.12 cm) Weight:      325.5 pounds (147.95 kg) BMI:     37.75 O2 Sat:      96 % on Room air Temp:     102.4 degrees F (39.11 degrees C) oral Pulse rate:   156 / minute BP sitting:   118 / 60  (right arm)  Vitals Entered By: Stanton Kidney Ditzler RN (January 24, 2010 11:49 AM)  O2 Flow:  Room air Is Patient Diabetic? Yes Did you bring your meter with you today? No Pain Assessment Patient in pain? yes     Location: all over Intensity: 10 Type: cont Onset of pain  past 3 days Nutritional Status BMI of > 30 = obese Nutritional Status Detail appetite down  Have you ever been in a relationship where you felt threatened, hurt or afraid?denies   Does patient need assistance? Functional Status Self care Ambulation Normal Comments Wife with pt. Walked 10 miles 01/21/10 to [place] for diabtes. Taken to [facility] ER per Dr Phillips Odor 12N.   Primary Care Provider:  Margarito Liner MD   History of Present Illness: Patient is a 60 year old man who presents today with abdominal pain,and weakness since Tuesday.  He walked 10 miles on Tuesday and since then has been nauseated and weak.  He has not eaten anything but has been drinking water.  He has been taking his medications including Levamir and Novolog.    He has some nonproductive cough, abdominal pain, and headaches.  Denies any chest pain, dysuria, or diarrhea   He is seen in the clinic today and following review of his vital signs we will send him up to the ER for monitoring and IV hydration and possible admission.    Depression History:      The patient denies a depressed mood most of the day and a diminished interest in his usual daily activities.         Preventive Screening-Counseling & Management  Alcohol-Tobacco     Alcohol drinks/day: 0     Alcohol type: An occasional beer; no regular use     Smoking Status: quit   Smoking Cessation Counseling: yes     Packs/Day: 1/20     Year Started: 1970     Cans of tobacco/week: no  Caffeine-Diet-Exercise     Does Patient Exercise: yes     Type of exercise: WALKING     Times/week: 7  Current Medications (verified): 1)  Hydrocodone-Acetaminophen 5-325 Mg Tabs (Hydrocodone-Acetaminophen) .... Take 1and 1/2 Tablets By Mouth Every 6 Hours As Needed For Pain 2)  Amitriptyline Hcl 75 Mg  Tabs (Amitriptyline Hcl) .... Take 1 Tablet By Mouth Once A Day 3)  Viagra 50 Mg Tabs (Sildenafil Citrate) .... Take One Tablet By Mouth 1 Hour Before Sexual Activity.  Do Not Take More Than 1 Per Day. 4)  Lipitor 20 Mg  Tabs (Atorvastatin Calcium) .... Take 1 Tablet By Mouth Once A Day 5)  Prilosec 20 Mg Cpdr (Omeprazole) .... Take 1 Tablet By Mouth Once A Day 6)  Truetrack Test  Strp (Glucose Blood) .... Use To Test Blood Sugar Before Meals and At Bedtime. 7)  Prodigy Twist Top Lancets 28g  Misc (Lancets) .... Use To Test Blood Sugars Before Meals and At Bedtime Each Day 8)  Bd Insulin Syringe Ultrafine 31g X 5/16" 1 Ml Misc (  Insulin Syringe-Needle U-100) .... Use As Directed For Once Daily Insulin Injection 9)  Furosemide 20 Mg Tabs (Furosemide) .... Take 2 Tablets By Mouth Three Times A Day 10)  Prodigy Short Pen Needles 31g X 8 Mm Misc (Insulin Pen Needle) .... Use As Directed For Insulin Injection Two Times A Day 11)  Walker  Misc (Misc. Devices) .... Use As Directed. 12)  Neurontin 100 Mg Caps (Gabapentin) .... Take 1 Tablet By Mouth Three Times A Day 13)  Aspir-Low 81 Mg Tbec (Aspirin) .... Take 1 Tablet By Mouth Once A Day 14)  Levemir 100 Unit/ml Soln (Insulin Detemir) .... Inject 100 Units Subcutaneously Once A Day Each Morning 15)  Novolog Flexpen 100 Unit/ml Soln (Insulin Aspart) .... Inject 30 Units Subcutaneously At Meals Plus 1 Unit For Every 10 Mg/dl Above 604 Mg/dl Three Times A Day At Meals  Allergies: 1)  ! Vancomycin  Past History:  Past medical, surgical,  family and social histories (including risk factors) reviewed for relevance to current acute and chronic problems.  Past Medical History: Reviewed history from 04/09/2009 and no changes required. 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 No colon cancer. Father had MI at age 66. Mother had DM; 2 siblings have DM. No prostate cancer.  Social History: Reviewed history from 05/03/2009 and no changes required. Married; no children from current marriage. Unemployed- not looking for work. Signed up for disability - has not got it yet.  Review of Systems      See HPI  Physical Exam  Additional Exam:  General: Well developed, male in moderate distress Head: Atraumatic, normocephalic with no signs of trauma  Eyes: PERRLA, EOM intact  Nose: Nares patent, mucosa is pink and moist, no polyps noted  Mouth: Mucosa is pink and dry, poor dention,   Neck: Supple, full ROM, no thyromegaly or masses noted, neck veins are flat Resp: Clear to ascultation bilaterally, no wheezes, rales, or rhonchi noted  CV: Tachycardic and irregularly  irregular with no murmurs, rubs, or gallops noted  Abdomen: Soft and non-distended with normal bowel sounds.  Mild pain to palpation over entire abdomen worst in LLQ Pulses: Radial, carotid pulses are weak but equal and symmetric     Impression & Recommendations:  Problem # 1:  SYSTEMIC INFLAMMATORY RESPONSE SYNDROME UNSPEC (ICD-995.90) The patient meets the criteria for SIRS with a temperature greater then 38.5 and HR >90.  We will transfer him to the [facility] ER for monitoring, hydration, and possible admission.  Patient was taken to ER by Allene Dillon, RN.  ER triage and 1600 pager was notified.  Complete Medication List: 1)  Hydrocodone-acetaminophen 5-325 Mg Tabs (Hydrocodone-acetaminophen) .... Take 1and 1/2 tablets by mouth every 6 hours as needed for pain 2)  Amitriptyline Hcl 75 Mg Tabs (Amitriptyline hcl) .... Take 1 tablet by mouth once a day 3)  Viagra 50 Mg Tabs (Sildenafil citrate) .... Take one tablet by mouth 1 hour before sexual activity.  do not take more than 1 per day. 4)  Lipitor 20 Mg Tabs (Atorvastatin calcium) .... Take 1 tablet by mouth once a day 5)  Prilosec 20 Mg Cpdr (Omeprazole) .... Take 1 tablet by mouth once a day 6)  Truetrack Test Strp (Glucose blood) .... Use to test blood sugar before meals and at bedtime. 7)  Prodigy Twist Top Lancets 28g Misc (Lancets) .... Use to test blood sugars before meals and at bedtime each day 8)  Bd Insulin Syringe Ultrafine 31g X 5/16" 1 Ml Misc (Insulin syringe-needle u-100) .... Use as directed for once daily insulin injection 9)  Furosemide 20 Mg Tabs (Furosemide) .... Take 2 tablets by mouth three times a day 10)  Prodigy Short Pen Needles 31g X 8 Mm Misc (Insulin pen needle) .... Use as directed for insulin injection two times a day 11)  Walker Misc (Misc. devices) .... Use as directed. 12)  Neurontin 100 Mg Caps (Gabapentin) .... Take 1 tablet by mouth three times a day 13)  Aspir-low 81 Mg Tbec (Aspirin) ....  Take 1 tablet by mouth once a day 14)  Levemir 100 Unit/ml Soln (Insulin detemir) .... Inject 100 units subcutaneously once a day each morning 15)  Novolog Flexpen 100 Unit/ml Soln (Insulin aspart) .... Inject 30 units subcutaneously at meals plus 1 unit for every 10 mg/dl above 563 mg/dl three times a day at meals  Patient Instructions: 1)  Go to the ER.  Prevention & Chronic Care Immunizations   Influenza vaccine: Fluvax 3+  (05/03/2009)   Influenza vaccine deferral: Not available  (02/13/2009)   Influenza vaccine due: 03/13/2010    Tetanus booster: 02/20/2009: Td   Tetanus booster due: 02/21/2019    Pneumococcal vaccine: Pneumovax  (08/28/2009)   Pneumococcal vaccine due: 03/04/2015  Colorectal Screening   Hemoccult: negative x 3  (07/04/2008)   Hemoccult action/deferral: Not indicated  (12/12/2009)   Hemoccult due: 07/2009    Colonoscopy: (1) Diverticula (2) Medium hemorrhoids Exam by Dr. Jordan Hawks. Hung  (09/27/2008)   Colonoscopy due: 10/2018  Other Screening   PSA: 0.58  (08/28/2009)   PSA action/deferral: Discussed-PSA requested  (08/28/2009)   Smoking status: quit  (01/24/2010)  Diabetes Mellitus   HgbA1C: 11.0  (10/16/2009)   HgbA1C action/deferral: Ordered  (02/13/2009)   Hemoglobin A1C due: 11/12/2007    Eye exam: Diabetic background retinopathy Cataracts Glaucoma suspect Hyperopia, astigmatism, presbyopia Exam by [name], DO  (12/23/2009)   Diabetic eye exam action/deferral: Ophthalmology referral  (02/20/2009)   Eye exam due: 03/2010    Foot exam: yes  (01/01/2010)   Foot exam action/deferral: Do today   High risk foot: Yes  (01/01/2010)   Foot care education: Done  (01/01/2010)   Foot exam due: 04/03/2010    Urine microalbumin/creatinine ratio: 4.6  (11/12/2009)   Urine microalbumin action/deferral: Ordered   Urine microalbumin/cr due: 12/06/2008  Lipids   Total Cholesterol: 138  (11/12/2009)   Lipid panel action/deferral: Lipid  Panel ordered   LDL: 61  (11/12/2009)   LDL Direct: Not documented   HDL: 32  (11/12/2009)   Triglycerides: 226  (11/12/2009)   Lipid panel due: 03/15/2008    SGOT (AST): 57  (01/01/2010)   BMP action: Ordered   SGPT (ALT): 59  (01/01/2010)   Alkaline phosphatase: 216  (01/01/2010)   Total bilirubin: 0.4  (01/01/2010)  Self-Management Support :  Personal Goals (by the next clinic visit) :     Personal A1C goal: 7  (04/09/2009)     Personal blood pressure goal: 130/80  (04/09/2009)     Personal LDL goal: 100  (04/09/2009)    Patient will work on the following items until the next clinic visit to reach self-care goals:     Medications and monitoring: take my medicines every day, check my blood sugar, bring all of my medications to every visit, examine my feet every day  (01/24/2010)     Eating: drink diet soda or water instead of juice or soda, eat more vegetables, use fresh or frozen vegetables, eat foods that are low in salt, eat fruit for snacks and desserts  (01/24/2010)     Activity: take a 30 minute walk every day, park at the far end of the parking lot  (01/24/2010)     Home glucose monitoring frequency: 3 times a day  (06/04/2009)    Diabetes self-management support: Resources for patients handout, Written self-care plan  (01/01/2010)   Last diabetes self-management training by diabetes educator: 09/25/2009   Last medical nutrition therapy: 06/28/2008    Lipid self-management support: Resources for patients handout, Written self-care plan  (01/01/2010)     Appended Document: since tues, see note/pcp-joines/hla Patient called clinic this AM reporting fever, dyspnea and profound fatigue. Refused to go to ED but came to clinic for urgent evaluation. I saw the patient with Dr. Tonny Branch and found Mr. Primo to be quite ill appearing. Based on his vital signs- fever >101, soft blood pressure off of meds, Hr in 150's- I decided that he should be sent to the ED for rapid evaluation  and possible initiation of EGDC in the event of possible SS/SIRS. Notified SAR about admission.

## 2010-08-12 NOTE — Miscellaneous (Signed)
Summary: ADVANCED CPAP BI-LEVEL SET-UP  ADVANCED CPAP BI-LEVEL SET-UP   Imported By: Shon Hough 04/18/2010 12:38:46  _____________________________________________________________________  External Attachment:    Type:   Image     Comment:   External Document

## 2010-08-12 NOTE — Assessment & Plan Note (Signed)
Summary: diabetes/dmr   Vital Signs:  Patient profile:   60 year old male Weight:      329.3 pounds BMI:     38.19 Is Patient Diabetic? Yes Did you bring your meter with you today? Yes Comments did not eat this moring and did take his insulin because his blood sugar was 468- he says the only time his blood sugar gets to the "100s" is when he eats and drinks nothing   Allergies: 1)  ! Vancomycin   Complete Medication List: 1)  Glipizide 5 Mg Tabs (Glipizide) .... Take 2 tablets by mouth each morning and 2 tablets each afternoon. 2)  Hydrocodone-acetaminophen 5-325 Mg Tabs (Hydrocodone-acetaminophen) .... Take 1and 1/2 tablets by mouth every 6 hours as needed for pain 3)  Amitriptyline Hcl 75 Mg Tabs (Amitriptyline hcl) .... Take 1 tablet by mouth once a day 4)  Viagra 50 Mg Tabs (Sildenafil citrate) .... Take one tablet by mouth 1 hour before sexual activity.  do not take more than 1 per day. 5)  Lipitor 20 Mg Tabs (Atorvastatin calcium) .... Take 1 tablet by mouth once a day 6)  Novolog Mix 70/30 Flexpen 70-30 % Susp (Insulin aspart prot & aspart) .... Inject 65 units subcutaneously before breakfast and 75 units before supper. 7)  Prilosec 20 Mg Cpdr (Omeprazole) .... Take 1 tablet by mouth once a day 8)  Truetrack Test Strp (Glucose blood) .... Use to test blood sugar before meals and at bedtime. 9)  Prodigy Twist Top Lancets 28g Misc (Lancets) .... Use to test blood sugars before meals and at bedtime each day 10)  Prodigy Insulin Syringe 28g X 1/2" 1 Ml Misc (Insulin syringe-needle u-100) .... Use to injection insulin twice daily 11)  Furosemide 20 Mg Tabs (Furosemide) .... Take 1 tablet by mouth once a day 12)  Prodigy Short Pen Needles 31g X 8 Mm Misc (Insulin pen needle) .... Use as directed for insulin injection two times a day 13)  Prodigy Insulin Syringe 28g X 1/2" 1 Ml Misc (Insulin syringe-needle u-100) .... Use to inject insuln twice a day  Other Orders: DSMT (Medicaid) 60  Minutes [phone])  Diabetes Self Management Training  PCP: Margarito Liner MD Referring MD: Meredith Pel Date diagnosed with diabetes: 01/10/2006 Diabetes Type: Type 2 insulin treated Other persons present: no Current smoking Status: current  Vital Signs Todays Weight: 329.3lb  in BMI 38.19in-lbs   Assessment Work Hours: Not currently working Daily activities: Trying to walk more- doesn't like all the weight he's gained.  Sources of Support: wife Special needs or Barriers: reports he finds it difficult to eat on low budget. also reports that he is stressed about brother with cancer in [place].   Potential Barriers  Administrator  Health Care beliefs  Diabetes Medications:  Lipid lowering Meds? Yes Comments: downloaded meter and confirmed patient is taking insulin and glipizide as precribed. He states he is ready to change insulin -willing to take 3-5 injections per day. Unhappy with blood sugars. has sore on toe that has broken open and swelling still present. had old meter that does not keep time and date- provided patient with a new prodigy meter today that does keep time and date. .     Monitoring Self monitoring blood glucose 2 times a day Name of Meter  Prodigy Autocode Measures urine ketones? No  Recent Episodes of: Requiring Help from another person  Hyperglycemia : Yes Hypoglycemia: No Severe Hypoglycemia :  No   Carrys Food for Low Blood sugar No Can you tell if your blood sugar is low? Yes   Estimated /Usual Carb Intake Breakfast # of Carbs/Grams honey nut cheerios, banana and whole milk, or  eggs cheese and sausage- skips breakfast  ~2x/week Lunch # of Carbs/Grams regular beef bologna sanwich or sauerkraut and weinnies- regular weinies Dinner # of Carbs/Grams fried chicken wings x 4, corn and string beans with 24 oz mango juice Bedtime # of Carbs/Grams bag regular popcorn and regular soda  and regular juice  Nutrition assessment ETOH : Yes Amount per day: miller beer on [employer] challenge to eating healthy: Eating too much- not ready to change at this time- states he just has n't gotten around to trying lower fat, or buying diet drinnks- states is  aware of healthier options Diabetes Disease Process  Discussed today  Medications State name-action-dose-duration-side effects-and time to take medication: Demonstrates competency   State appropriate timing of food related to medication: Demonstrates competency    Nutritional Management  Monitoring  Complications State the causes-signs and symptoms and prevention of Hyperglycemia: Demonstrates competency   Explain proper treatment of hyperglycemia: Demonstrates competency    Exercise Diabetes Management Education Done: 09/25/2009    BEHAVIORAL GOALS INITIAL Utilizing medications if for therapeutic effectiveness: states he thinks his medicine needs to be changes- willing to try current insulin doses more and 3 times a day and if that doesn;t work then basla and bolus intensified therapy- will discuss with his pcphysiican        Dislikes on current doses he has to inject twice both morning and night. Also says he gets a pocket under his skin when he injects the larger doses.  Diabetes Self Management Support: wife and clinic staff  Follow-up: when ready to work on lifestyle change.  note patient has an appointment with doctor next month

## 2010-08-12 NOTE — Assessment & Plan Note (Signed)
Summary: Soc. Work  Soc. Work.  20 minutes. Mr. Kinsella has come to see me this morning to go over [facility] paperwork which I reviewed.  I have advised him to keep his appmt at [facility] on the 23rd but hold onto this paperwork just in case [facility] cannot assist.  I brought Mr. Popp over to Triage and Myriam Jacobson called [facility] who will provide samples today until the 23rd.  [facility] after 1:30 today to pick up insulin samples and then keep his appmt on Tuesday.   The patient is receptive to our help and will follow up on resources.

## 2010-08-12 NOTE — Progress Notes (Signed)
Summary: new insulin regimen/dmr  Phone Note Outgoing Call   Call placed by: Jamison Neighbor RD,CDE,  October 08, 2009 3:39 PM Summary of Call: called patient per physican request to inform him of new insulin doses. patient verbalized understanding saying he would begin new regimen 50 units before meals three times day tomorrw.

## 2010-08-12 NOTE — Letter (Signed)
Summary: [facility]: Notice Of Denial  [facility]: Notice Of Denial   Imported By: Florinda Marker 09/05/2009 14:35:35  _____________________________________________________________________  External Attachment:    Type:   Image     Comment:   External Document

## 2010-08-12 NOTE — Letter (Signed)
[facility]   Imported By: Margie Billet 01/01/2010 11:46:35  _____________________________________________________________________  External Attachment:    Type:   Image     Comment:   External Document

## 2010-08-12 NOTE — Progress Notes (Signed)
Summary: diabetes support/dmr  Phone Note Outgoing Call   Call placed by: Jamison Neighbor RD,CDE,  September 20, 2009 5:40 PM Summary of Call: returned patient call:he was calling to find out if he should come in on 3/23- we discusse dnad he is still thinking about the differnt insulin regimen we discussed. he is to see Dr. Meredith Pel 4/6- we decided that htye could discuss more at that time. He can schedule an apopintment with CDE at that time.

## 2010-08-12 NOTE — Assessment & Plan Note (Signed)
Summary: DM TRAINING/VS   Vital Signs:  Patient profile:   60 year old male Weight:      330.5 pounds Is Patient Diabetic? Yes CBG Result 227 Comments FASTING THIS AM ANDS TOOK 60 UNITS nOVOLOG MIX 70/30 with out eating breakfast because fasting at home was higher than this   Preventive Screening-Counseling & Management  Alcohol-Tobacco     Smoking Cessation Counseling: yes  Allergies: 1)  ! Vancomycin   Complete Medication List: 1)  Glipizide 5 Mg Tabs (Glipizide) .... Take 2 tablets by mouth each morning and 2 tablets each afternoon. 2)  Hydrocodone-acetaminophen 5-325 Mg Tabs (Hydrocodone-acetaminophen) .... Take 1and 1/2 tablets by mouth every 6 hours as needed for pain 3)  Amitriptyline Hcl 75 Mg Tabs (Amitriptyline hcl) .... Take 1 tablet by mouth once a day 4)  Viagra 50 Mg Tabs (Sildenafil citrate) .... Take one tablet by mouth 1 hour before sexual activity.  do not take more than 1 per day. 5)  Lipitor 20 Mg Tabs (Atorvastatin calcium) .... Take 1 tablet by mouth once a day 6)  Novolog Mix 70/30 Flexpen 70-30 % Susp (Insulin aspart prot & aspart) .... Inject 65 units subcutaneously before breakfast and 75 units before supper. 7)  Prilosec 20 Mg Cpdr (Omeprazole) .... Take 1 tablet by mouth once a day 8)  Truetrack Test Strp (Glucose blood) .... Use to test blood sugar before meals and at bedtime. 9)  Prodigy Twist Top Lancets 28g Misc (Lancets) .... Use to test blood sugars before meals and at bedtime each day 10)  Prodigy Insulin Syringe 28g X 1/2" 1 Ml Misc (Insulin syringe-needle u-100) .... Use to injection insulin twice daily 11)  Furosemide 20 Mg Tabs (Furosemide) .... Take 1 tablet by mouth once a day 12)  Prodigy Short Pen Needles 31g X 8 Mm Misc (Insulin pen needle) .... Use as directed for insulin injection two times a day 13)  Prodigy Insulin Syringe 28g X 1/2" 1 Ml Misc (Insulin syringe-needle u-100) .... Use to inject insuln twice a day  Other Orders: DSMT  (Medicaid) 60 Minutes [phone])  Patient Instructions: 1)  PLEASE NOTE: increased Novolog Mix 70/30 insulin doses to 65 units 15 minutes before first meal fo the day and 75 units 15 minutes before evening meal 2)  You said you wanted names of healthier sausage to try: 3)         Perdue [place] Italian sausage 4)         Tofuturkey [place] Sausage- harrist teeter- 3.99 5)  Beer- limit to 2 12 ounces servings /day 6)  Regular soda- can try mixing it with selzer water when you do drink it. If you use bottles you can save half for the next day. 7)  ALWAYS carry with you To treat low blood sugar use: foods that are ONLY sugar (no fat)  8)  Hard candy like Lifesavers, starlight peppermints, werthers, 3-4 9)  Softer candy: sweet tearts or smarties- 3 packs or rolls 10)  Glucose tablets 3-4 11)  Gel cake decorating icing- 1 tube 12)  make follow-up with Lupita Leash in 2-4 weeks 13)  Keep other meter for a back up in case you need it. 14)   Lupita Leash [phone] Discussed increase in insulin doses by 5 units both moring and evening with Dr. Meredith Pel who was in office today. Diabetes Self Management Training  PCP: Margarito Liner MD Referring MD: Meredith Pel Date diagnosed with diabetes: 01/10/2006 Diabetes Type: Type 2 insulin treated Other persons present: no  Current smoking Status: current  Vital Signs Todays Weight: 330.5lb  in in-lbs   Diabetes Medications:  Lipid lowering Meds? Yes Comments: left meter on bus, reports blood sugar are still too high, currently dose is 130 total daily Dose ( ~ 0.8- 0.9 units/kg)- wihtout insulin sensitizer he will likely need 1.2 units/kg ( ~175 units/day) or more to reach target blood sugars. Long discussion today about benefits nad burdens of his current insulin regimen and basla bolus insulin regimen that would give patient the flexibilty in meal times thathe deisres. He also reports great fear of hypoglycemia, trying to eat less in PM, not after 7 pm, only vanilla wafers and  graham crackers and grapefruit (high carb foods) gave patient another meter today. He soemtimes eats breakfast and sometimes does not depending on if his blood sugar is high or not and ifhe is hungry or not.     Monitoring Self monitoring blood glucose 2 times a day Name of Meter  Prodigy Autocode Measures urine ketones? No  Recent Episodes of: Requiring Help from another person  Hyperglycemia : Yes Hypoglycemia: No Severe Hypoglycemia : No   Carrys Food for Low Blood sugar No Can you tell if your blood sugar is low? Yes   Estimated /Usual Carb Intake Breakfast # of Carbs/Grams sometimes skips or eats cold cereal with banana and milk Lunch # of Carbs/Grams 3 medium sized bowls of homemade soup with okra, corn and tomatoes Dinner # of Carbs/Grams baled meat, starch and vegetable or sandwich(s) Bedtime # of Carbs/Grams grapefruit, vanilla wafers or grahms withpeanut butter  Nutrition assessment ETOH : Yes Amount per day: miller beer on occassion Do you read food labels?                                                                          1-2 regular soda/week now- water and diet soda - flavored  water that has zero carbs Biggest challenge to eating healthy: Eating too much ans skipping meals  Activity Limitations  Appropriate physical activity  Type of physical activity  Walk Length of time: # of times per week: 5 Diabetes Disease Process  Discussed today  Medications State name-action-dose-duration-side effects-and time to take medication: Demonstrates competency    Nutritional Management State changes planned for home meals/snacks: Needs review/assistance    Monitoring  Complications Explain proper treatment of hypoglycemia: Needs review/assistance    Exercise  Lifestyle changes:Goal setting and Problem solving State benefits of making appropriate lifestyle changes: Demonstrates competencyIdentify lifestyle behaviors that need to change: Needs  review/assistance   Identify risk factors that interfere with health: Needs review/assistance   Develop strategies to reduce risk factors: Needs review/assistance   Diabetes Management Education Done: 09/04/2009    BEHAVIORAL GOALS INITIAL Preventing,detecting and treating complications: use pure sugar to treat low blood sugars and always carry some with you         Discussed options for improved blood sugar control today- pateint feels that more flexiblity in eating times and amounts would be helpful as well as being able to fix his blood sugars when high. He is fearful of having  nocturnal hypoglycemia ( had a friend just die from low blood sugar at night) so having less  insulin peak on board over night is also appealing to him.Marland Kitchen He is thinking about retrying basal bolus insulin therapy if doctor agrees.   Diabetes Self Management Support: wife and clinic staff  Follow-up: 2-4 weeks with CDE- note patient has an appointment with doctor in 1 month

## 2010-08-12 NOTE — Progress Notes (Signed)
Summary: Soc. Work  Nurse, children's placed by: Social Work Call placed to: Patient & [facility] Summary of Call: Emailed [employer] and explained situation.   She instructed patient to come in again next week to reenroll once his Medicaid has expired/bring all income information.  Patient does not need orange card.   Called patient and instructed him to visit with [facility] to reenroll early next.

## 2010-08-12 NOTE — Progress Notes (Signed)
Summary: coordiantion of insulin supplywith [facility]/dmr  Phone Note Outgoing Call   Call placed by: Jamison Neighbor RD,CDE,  March 14, 2010 4:08 PM Summary of Call: called [facility]) to find out what medicine patient has and will be getting from them to coordiante care per request of Dr. Meredith Pel. left mesage for return call.   Follow-up for Phone Call        Spoke to Pharmacisit at [facility]: Patient received only 1 box Novolog flexpens from [facility] on 8/18. this was about a 10 days supply (1500 units) at most (he was likely using moire since he was out of levemir) . They were informed of the samples we gave him on the 29th and are preapred to be his sole insulin Gillis Boardley and will refill his orders  ~ sept 15th. Of note, they also got prescriptions form Dr. Daune Perch office today with updated doses.  Follow-up by: Jamison Neighbor RD,CDE,  March 14, 2010 5:05 PM

## 2010-08-12 NOTE — Progress Notes (Signed)
Summary: meds/ hla  Phone Note Call from Patient   Summary of Call: pt calls states he cannot afford insulin copay,  he needs insulin, i checked on several possible sources and now have found that [place] [facility] will help him but alas, there is an application to be done, i have called pt and left a message for him to call clinic back and speak w/ triage, i am now awaiting his call. Initial call taken by: Marin Roberts RN,  February 24, 2010 2:27 PM  Follow-up for Phone Call        Discussed case with [facility] yesterday.  If we can give Mr. Morino samples until he can get to [facility] on the 23rd that may be best option.  [place] [facility] is another option but [facility] will likely be able to help here once he keeps that appmt.  Dorothe Pea  February 25, 2010 9:21 AM   Additional Follow-up for Phone Call Additional follow up Details #1::        i have spoken to mr Tokarz today, he is going to come over and pick up some insulin here in clinic, i have also left a message for [facility] to call me asap to see if we can move up his appt from the 23rd Additional Follow-up by: Marin Roberts RN,  February 25, 2010 11:11 AM    Additional Follow-up for Phone Call Additional follow up Details #2::    Mr. Bertran was given novalog flexpen 100/units/ml and instructed to take 30 units at meals plus 1 unit for every 10 mg/dl above 102 mg/dl from the sample cabinet.  It has been signed for. Follow-up by: Doneen Poisson MD,  February 25, 2010 12:39 PM  Prescriptions: NOVOLOG FLEXPEN 100 UNIT/ML SOLN (INSULIN ASPART) Inject 30 units subcutaneously at meals plus 1 unit for every 10 mg/dL above 725 mg/dL three times a day at meals  #12 pens x 11   Entered and Authorized by:   Doneen Poisson MD   Signed by:   Doneen Poisson MD on 02/25/2010   Method used:   Historical   RxID:   [id number]

## 2010-08-12 NOTE — Miscellaneous (Signed)
Summary: [facility]: Diabetes Testing Supplies  [facility]: Diabetes Testing Supplies   Imported By: Florinda Marker 08/20/2009 15:43:57  _____________________________________________________________________  External Attachment:    Type:   Image     Comment:   External Document

## 2010-08-12 NOTE — Letter (Signed)
Summary: BLOOD GLUCOSE REPORT  BLOOD GLUCOSE REPORT   Imported By: Margie Billet 10/03/2009 10:54:42  _____________________________________________________________________  External Attachment:    Type:   Image     Comment:   External Document

## 2010-08-12 NOTE — Assessment & Plan Note (Signed)
Summary: est-ck/fu/meds/cfb   Vital Signs:  Patient profile:   60 year old male Height:      78 inches Weight:      334.2 pounds BMI:     38.76 Temp:     98.2 degrees F oral Pulse rate:   96 / minute BP sitting:   143 / 81  (right arm)  Vitals Entered By: Filomena Jungling NT II (February 05, 2010 11:37 AM) CC: HFU Is Patient Diabetic? Yes Did you bring your meter with you today? No Pain Assessment Patient in pain? yes     Location: foot Intensity: 9 Type: aching Onset of pain  Chronic Nutritional Status BMI of > 30 = obese CBG Result 125  Have you ever been in a relationship where you felt threatened, hurt or afraid?No   Does patient need assistance? Functional Status Self care Ambulation Normal   Primary Care Provider:  Margarito Liner MD  CC:  HFU.  History of Present Illness: Patient returns for follow-up of a recent hospitalization 7/15-7/20 with left lower extremity cellulitis and other acute problems (see discharge summary for details).  He has just completed a course of oral doxycycline, and he reports that he is being followed at the wound center for a left foot wound on Tuesdays and Fridays.  He had a dressing and cast placed there this week.  He was seen in the ED after his discharge with complaint of leg edema; his furosemide had been held at the time of discharge, but was resumed at the time of his ED visit at a lower dose and his edema subsequently improved.   Otherwise he reports that he has done well since his discharge home.  He reports that he has been compliant with his medications; he apparently misunderstood the instructions for his correction insulin and has been taking a higher dose than prescribed.  He denies any low blood sugars.     Preventive Screening-Counseling & Management  Alcohol-Tobacco     Alcohol drinks/day: 0     Alcohol type: An occasional beer; no regular use     Smoking Status: quit     Smoking Cessation Counseling: yes     Packs/Day: 1/20    Year Started: 1970     Cans of tobacco/week: no  Caffeine-Diet-Exercise     Does Patient Exercise: yes     Type of exercise: WALKING     Times/week: 7  Current Medications (verified): 1)  Hydrocodone-Acetaminophen 5-325 Mg Tabs (Hydrocodone-Acetaminophen) .... Take 1and 1/2 Tablets By Mouth Every 6 Hours As Needed For Pain 2)  Amitriptyline Hcl 75 Mg  Tabs (Amitriptyline Hcl) .... Take 1 Tablet By Mouth Once A Day 3)  Viagra 50 Mg Tabs (Sildenafil Citrate) .... Take One Tablet By Mouth 1 Hour Before Sexual Activity.  Do Not Take More Than 1 Per Day. 4)  Lipitor 20 Mg  Tabs (Atorvastatin Calcium) .... Take 1 Tablet By Mouth Once A Day 5)  Prilosec 20 Mg Cpdr (Omeprazole) .... Take 1 Tablet By Mouth Once A Day 6)  Truetrack Test  Strp (Glucose Blood) .... Use To Test Blood Sugar Before Meals and At Bedtime. 7)  Prodigy Twist Top Lancets 28g  Misc (Lancets) .... Use To Test Blood Sugars Before Meals and At Bedtime Each Day 8)  Bd Insulin Syringe Ultrafine 31g X 5/16" 1 Ml Misc (Insulin Syringe-Needle U-100) .... Use As Directed For Once Daily Insulin Injection 9)  Prodigy Short Pen Needles 31g X 8 Mm Misc (Insulin Pen  Needle) .... Use As Directed For Insulin Injection Two Times A Day 10)  Walker  Misc (Misc. Devices) .... Use As Directed. 11)  Neurontin 100 Mg Caps (Gabapentin) .... Take 1 Tablet By Mouth Three Times A Day 12)  Aspir-Low 81 Mg Tbec (Aspirin) .... Take 1 Tablet By Mouth Once A Day 13)  Levemir 100 Unit/ml Soln (Insulin Detemir) .... Inject 100 Units Subcutaneously Once A Day Each Morning 14)  Novolog Flexpen 100 Unit/ml Soln (Insulin Aspart) .... Inject 30 Units Subcutaneously At Meals Plus 1 Unit For Every 10 Mg/dl Above 960 Mg/dl Three Times A Day At Meals 15)  Lasix 20 Mg Tabs (Furosemide) .... Take 2 Tablets By Mouth Two Times A Day  Allergies (verified): 1)  ! Vancomycin  Review of Systems General:  Denies chills and fever. CV:  Complains of swelling of feet. GI:   Denies nausea and vomiting.  Physical Exam  General:  alert, no distress Lungs:  normal respiratory effort, normal breath sounds, no crackles, and no wheezes.   Heart:  normal rate, regular rhythm, no murmur, no gallop, and no rub.   Extremities:  trace right leg edema; left leg and foot in bandage/cast   Impression & Recommendations:  Problem # 1:  CELLULITIS (ICD-682.9) Patient has done well since his discharge home, and has completed his antibiotic course.  He will follow up at the wound center for management of his left foot wound.  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 Sharl Ma, with better control of his blood sugars.  Plan is to confirm his regimen.  Will check a metabolic panel.  His updated medication list for this problem includes:    Aspir-low 81 Mg Tbec (Aspirin) .Marland Kitchen... Take 1 tablet by mouth once a day    Levemir 100 Unit/ml Soln (Insulin detemir) ..... Inject 100 units subcutaneously once a day each morning    Novolog Flexpen 100 Unit/ml Soln (Insulin aspart) ..... Inject 30 units subcutaneously at meals plus 1 unit for every 10 mg/dl above 478 mg/dl three times a day at meals  Labs Reviewed: Creat: 1.63 (01/01/2010)     Last Eye Exam: Diabetic background retinopathy Cataracts Glaucoma suspect Hyperopia, astigmatism, presbyopia Exam by [name], DO (12/23/2009) Reviewed HgBA1c results: 10.6 (02/05/2010)  11.0 (10/16/2009)  Orders: T-Hgb A1C (in-house) ([id number]) T- Capillary Blood Glucose ([id number])  Problem # 3:  DIABETIC FOOT ULCER (ICD-250.80) As above, patient will follow up at the wound center.  His updated medication list for this problem includes:    Aspir-low 81 Mg Tbec (Aspirin) .Marland Kitchen... Take 1 tablet by mouth once a day    Levemir 100 Unit/ml Soln (Insulin detemir) ..... Inject 100 units subcutaneously once a day each  morning    Novolog Flexpen 100 Unit/ml Soln (Insulin aspart) ..... Inject 30 units subcutaneously at meals plus 1 unit for every 10 mg/dl above 846 mg/dl three times a day at meals  Problem # 4:  RENAL INSUFFICIENCY, CHRONIC (ICD-585.9) Will check a metabolic panel.  Labs Reviewed: BUN: 18 (01/01/2010)   Cr: 1.63 (01/01/2010)    Hgb: 12.6 (08/28/2009)   Hct: 40.0 (08/28/2009)   Ca++: 9.1 (01/01/2010)    TP: 7.3 (01/01/2010)   Alb: 4.0 (01/01/2010)  Problem # 5:  HYPERLIPIDEMIA (ICD-272.2) Continue Lipitor at current dose.  His updated medication list for this problem includes:    Lipitor 20  Mg Tabs (Atorvastatin calcium) .Marland Kitchen... Take 1 tablet by mouth once a day  Labs Reviewed: SGOT: 57 (01/01/2010)   SGPT: 59 (01/01/2010)   HDL:32 (11/12/2009), 28 (06/04/2009)  LDL:61 (11/12/2009), 65 (06/04/2009)  Chol:138 (11/12/2009), 132 (06/04/2009)  Trig:226 (11/12/2009), 193 (06/04/2009)  Complete Medication List: 1)  Hydrocodone-acetaminophen 5-325 Mg Tabs (Hydrocodone-acetaminophen) .... Take 1and 1/2 tablets by mouth every 6 hours as needed for pain 2)  Amitriptyline Hcl 75 Mg Tabs (Amitriptyline hcl) .... Take 1 tablet by mouth once a day 3)  Viagra 50 Mg Tabs (Sildenafil citrate) .... Take one tablet by mouth 1 hour before sexual activity.  do not take more than 1 per day. 4)  Lipitor 20 Mg Tabs (Atorvastatin calcium) .... Take 1 tablet by mouth once a day 5)  Prilosec 20 Mg Cpdr (Omeprazole) .... Take 1 tablet by mouth once a day 6)  Truetrack Test Strp (Glucose blood) .... Use to test blood sugar before meals and at bedtime. 7)  Prodigy Twist Top Lancets 28g Misc (Lancets) .... Use to test blood sugars before meals and at bedtime each day 8)  Bd Insulin Syringe Ultrafine 31g X 5/16" 1 Ml Misc (Insulin syringe-needle u-100) .... Use as directed for once daily insulin injection 9)  Prodigy Short Pen Needles 31g X 8 Mm Misc (Insulin pen needle) .... Use as directed for insulin injection two  times a day 10)  Walker Misc (Misc. devices) .... Use as directed. 11)  Neurontin 100 Mg Caps (Gabapentin) .... Take 1 tablet by mouth three times a day 12)  Aspir-low 81 Mg Tbec (Aspirin) .... Take 1 tablet by mouth once a day 13)  Levemir 100 Unit/ml Soln (Insulin detemir) .... Inject 100 units subcutaneously once a day each morning 14)  Novolog Flexpen 100 Unit/ml Soln (Insulin aspart) .... Inject 30 units subcutaneously at meals plus 1 unit for every 10 mg/dl above 086 mg/dl three times a day at meals 15)  Lasix 20 Mg Tabs (Furosemide) .... Take 2 tablets by mouth two times a day  Other Orders: T-CMP with Estimated GFR ([id number]) Social Work Referral (Social )  Patient Instructions: 1)  Please schedule a follow-up appointment in 1 month.  Prescriptions: LEVEMIR 100 UNIT/ML SOLN (INSULIN DETEMIR) Inject 100 units subcutaneously once a day each morning  #30 mL x 11   Entered and Authorized by:   Margarito Liner MD   Signed by:   Margarito Liner MD on 02/05/2010   Method used:   Electronically to        [facility] [id number] (retail)       [address] [place], [place]  [address]       Ph: [phone]       Fax: [phone]   RxID:   [id number] NOVOLOG FLEXPEN 100 UNIT/ML SOLN (INSULIN ASPART) Inject 30 units subcutaneously at meals plus 1 unit for every 10 mg/dL above 518 mg/dL three times a day at meals  #12 pens x 11   Entered and Authorized by:   Margarito Liner MD   Signed by:   Margarito Liner MD on 02/05/2010   Method used:   Electronically to        [facility] [id number] (retail)       [address]       [place], [place]  [address]  Ph: [phone]       Fax: [phone]   RxID:   [id number]   Prevention & Chronic Care Immunizations   Influenza vaccine: Fluvax 3+  (05/03/2009)   Influenza vaccine deferral: Not available  (02/13/2009)   Influenza vaccine due: 03/13/2010    Tetanus booster:  02/20/2009: Td   Tetanus booster due: 02/21/2019    Pneumococcal vaccine: Pneumovax  (08/28/2009)   Pneumococcal vaccine due: 03/04/2015  Colorectal Screening   Hemoccult: negative x 3  (07/04/2008)   Hemoccult action/deferral: Not indicated  (12/12/2009)   Hemoccult due: 07/2009    Colonoscopy: (1) Diverticula (2) Medium hemorrhoids Exam by Dr. Jordan Hawks. Hung  (09/27/2008)   Colonoscopy due: 10/2018  Other Screening   PSA: 0.58  (08/28/2009)   PSA action/deferral: Discussed-PSA requested  (08/28/2009)   Smoking status: quit  (02/05/2010)  Diabetes Mellitus   HgbA1C: 10.6  (02/05/2010)   HgbA1C action/deferral: Ordered  (02/13/2009)   Hemoglobin A1C due: 11/12/2007    Eye exam: Diabetic background retinopathy Cataracts Glaucoma suspect Hyperopia, astigmatism, presbyopia Exam by [name], DO  (12/23/2009)   Diabetic eye exam action/deferral: Ophthalmology referral  (02/20/2009)   Eye exam due: 03/2010    Foot exam: yes  (01/01/2010)   Foot exam action/deferral: Do today   High risk foot: Yes  (01/01/2010)   Foot care education: Done  (01/01/2010)   Foot exam due: 04/03/2010    Urine microalbumin/creatinine ratio: 4.6  (11/12/2009)   Urine microalbumin action/deferral: Ordered   Urine microalbumin/cr due: 12/06/2008  Lipids   Total Cholesterol: 138  (11/12/2009)   Lipid panel action/deferral: Lipid Panel ordered   LDL: 61  (11/12/2009)   LDL Direct: Not documented   HDL: 32  (11/12/2009)   Triglycerides: 226  (11/12/2009)   Lipid panel due: 03/15/2008    SGOT (AST): 57  (01/01/2010)   BMP action: Ordered   SGPT (ALT): 59  (01/01/2010)   Alkaline phosphatase: 216  (01/01/2010)   Total bilirubin: 0.4  (01/01/2010)  Self-Management Support :   Personal Goals (by the next clinic visit) :     Personal A1C goal: 7  (04/09/2009)     Personal blood pressure goal: 130/80  (04/09/2009)     Personal LDL goal: 100  (04/09/2009)    Patient will work on  the following items until the next clinic visit to reach self-care goals:     Medications and monitoring: take my medicines every day, check my blood sugar, bring all of my medications to every visit  (02/05/2010)     Eating: drink diet soda or water instead of juice or soda, eat more vegetables, use fresh or frozen vegetables, eat foods that are low in salt, eat baked foods instead of fried foods, eat fruit for snacks and desserts  (02/05/2010)     Activity: take a 30 minute walk every day, park at the far end of the parking lot  (01/24/2010)     Home glucose monitoring frequency: 3 times a day  (06/04/2009)    Diabetes self-management support: Resources for patients handout, Written self-care plan  (01/01/2010)   Last diabetes self-management training by diabetes educator: 09/25/2009   Last medical nutrition therapy: 06/28/2008    Lipid self-management support: Education handout  (02/05/2010)     Lipid education handout printed  Laboratory Results   Blood Tests   Date/Time Received: February 05, 2010 12:20 PM  Date/Time Reported: Alric Quan  February 05, 2010 12:20 PM   HGBA1C: 10.6%   (Normal  Range: Non-Diabetic - 3-6%   Control Diabetic - 6-8%) CBG Random:: 125mg /dL    Process Orders Check Orders Results:     [facility]: ABN not required for this insurance Tests Sent for requisitioning (February 07, 2010 6:58 PM):     02/05/2010: [facility] -- T-CMP with Estimated GFR [[id number]] (signed)     02/05/2010: [facility] -- [facility] w/Diff [[id number]] (signed)

## 2010-08-12 NOTE — Discharge Summary (Signed)
Summary: Hospital Discharge Update    Hospital Discharge Update:  Date of Admission: 01/24/2010 Date of Discharge: 01/29/2010  Brief Summary:  Pt was admitted from clinic with chief complaint of fever, sob, coughing abdominal pain and generalized body pain. CXR was negative x 2 but he was empirically treated for an atypical pna with IV Rocephin, Azitrhomycin vs RMSF with Doxycyclin for 4 days pending blood cultures and RMSF titers. He was clinically stable by day 4. However, he subsequently developed a LLE cellulitis that most likely originated from an open wound on the plantar surface of his foot. LE U/S was negative for DVT. He was clinically stable by day 4 at which point we d/ced the IV antibiotics and started him on Doxycycline po. His creatinine also bumped to 2.5 on admission. We held his Lasix because of this and by discharge his Cr was back to normal. Of note,  he did have a short run of Afib with RVR; however, CEs were cycled and negative. He had no complaints at discharge.  Labs needed at follow-up: Basic metabolic panel  Other follow-up issues:  We d/ced his Lasix 2/2 elevated Cr on admission. Pls reassess at followup and may resume once renal function stays normal.  Problem list changes:  Added new problem of CELLULITIS (ICD-682.9)  Medication list changes:  Added new medication of DOXYCYCLINE HYCLATE 100 MG TABS (DOXYCYCLINE HYCLATE) take one tablet every 12 hours for 7 days - Signed Removed medication of FUROSEMIDE 20 MG TABS (FUROSEMIDE) Take 2 tablets by mouth three times a day Rx of DOXYCYCLINE HYCLATE 100 MG TABS (DOXYCYCLINE HYCLATE) take one tablet every 12 hours for 7 days;  #14 x 0;  Signed;  Entered by: Jaci Lazier MD;  Authorized by: Jaci Lazier MD;  Method used: Print then Give to Patient  The medication, problem, and allergy lists have been updated.  Please see the dictated discharge summary for details.  Discharge medications:  HYDROCODONE-ACETAMINOPHEN 5-325  MG TABS (HYDROCODONE-ACETAMINOPHEN) Take 1and 1/2 tablets by mouth every 6 hours as needed for pain AMITRIPTYLINE HCL 75 MG  TABS (AMITRIPTYLINE HCL) Take 1 tablet by mouth once a day VIAGRA 50 MG TABS (SILDENAFIL CITRATE) Take one tablet by mouth 1 hour before sexual activity.  Do not take more than 1 per day. LIPITOR 20 MG  TABS (ATORVASTATIN CALCIUM) Take 1 tablet by mouth once a day PRILOSEC 20 MG CPDR (OMEPRAZOLE) Take 1 tablet by mouth once a day TRUETRACK TEST  STRP (GLUCOSE BLOOD) Use to test blood sugar before meals and at bedtime. PRODIGY TWIST TOP LANCETS 28G  MISC (LANCETS) Use to test blood sugars before meals and at bedtime each day BD INSULIN SYRINGE ULTRAFINE 31G X 5/16" 1 ML MISC (INSULIN SYRINGE-NEEDLE U-100) Use as directed for once daily insulin injection PRODIGY SHORT PEN NEEDLES 31G X 8 MM MISC (INSULIN PEN NEEDLE) Use as directed for insulin injection two times a day WALKER  MISC (MISC. DEVICES) use as directed. NEURONTIN 100 MG CAPS (GABAPENTIN) Take 1 tablet by mouth three times a day ASPIR-LOW 81 MG TBEC (ASPIRIN) Take 1 tablet by mouth once a day LEVEMIR 100 UNIT/ML SOLN (INSULIN DETEMIR) Inject 100 units subcutaneously once a day each morning NOVOLOG FLEXPEN 100 UNIT/ML SOLN (INSULIN ASPART) Inject 30 units subcutaneously at meals plus 1 unit for every 10 mg/dL above 454 mg/dL three times a day at meals DOXYCYCLINE HYCLATE 100 MG TABS (DOXYCYCLINE HYCLATE) take one tablet every 12 hours for 7 days  Other patient instructions:  Pls report any chest pain, trouble breathing, night sweats or fever to 101 or higher to your nearest ER. Pls followup with Dr. Meredith Pel for your clinic appointment on 02/05/10 at 11.30am. Do not take your Lasix (Furosemide 20mg ) until after you have followed up with Dr. Meredith Pel. Call if you have any other problems.  Note: Hospital Discharge Medications & Other Instructions handout was printed, one copy for patient and a second copy to be placed in  hospital chart.  Prescriptions: DOXYCYCLINE HYCLATE 100 MG TABS (DOXYCYCLINE HYCLATE) take one tablet every 12 hours for 7 days  #14 x 0   Entered and Authorized by:   Jaci Lazier MD   Signed by:   Jaci Lazier MD on 01/29/2010   Method used:   Print then Give to Patient   RxID:   [id number]

## 2010-08-12 NOTE — Progress Notes (Signed)
Summary: [employer]  Phone Note Outgoing Call   Summary of Call: Call to [name] to discuss [employer].  [name] reported that the bus stop is about a block away from him and he regularly uses the bus.  He reports that he has balance issues and uses a cane and that he did fall in his home recently.   He does get a discount on his bus tix so it costs him 65 cents each way.   I told him I would discuss with Dr. Meredith Pel however our initial response to eligibility was that he was able to use the bus and likely would not qualify.  Still I am concerned about balance issues and whether that would make him eligible in that he has neuropathy and pain in his feet and is at risk of falling.

## 2010-08-12 NOTE — Progress Notes (Signed)
Summary: heat related illness/ hla  Phone Note Call from Patient Call back at [facility] Phone [phone]   Caller: Patient Complaint: Urinary/GYN Problems Summary of Call: call left on voicemail from patient: [phone].Sick since  tuesday-sounds a bit breathless on phone when speaking and he asked "what are the signs of dehyration? " thinks the sun got him. He did get insulin -I did not ask him what blood sugars are running...    He is  dizzy, dry mlouth, heartbeating rapidly, peeing a lot, drinking- can come n this afternoon. He is concerned about not being admitted becaue he has to meet with alwayer onsatirday- advised him to coe in and discuss that with doctor who he sees. "I know my health is more important" He reports foot okay, feels like pins in foot. answered "yes"-to feels like he has a fever, fell once- has been inthe bed since Tuesday.  Told him I would have Triage nurse call him.  Initial call taken by: Jamison Neighbor RD,CDE,  January 24, 2010 9:39 AM  Follow-up for Phone Call        called pt and spoke w/ him walked a very long ways tues in the extreme heat has been sick since then, he states he will have to get transportation and is instructed to come asap. will discuss with the attending and placed on dr pribula's schedule Follow-up by: Marin Roberts RN,  January 24, 2010 9:58 AM  Additional Follow-up for Phone Call Additional follow up Details #1::        patient scheduled for appointment. Will eval when he arrives.

## 2010-08-12 NOTE — Progress Notes (Signed)
Summary: cannot afford insulin/dmr  Phone Note Call from Patient   Caller: Patient Summary of Call: patient called & left message requesting a return call: Medicaid ran out because i got disability, met with [name]: she  said he is over the limit for the health department -that he can go to [employer] and get a VIC card to get pills for free.  Medicare doesn't start until october.   Do we have any idea how he will afford his insulin? He is more concerned about it than the pills... told him I didn't know an affordable way for hm to get his insulin, but would liook into this and get back to him- he has  2 weeks at most left on medicaitions currently.  will request assistance of Social worker & notify PCP Initial call taken by: Jamison Neighbor RD,CDE,  Dec 03, 2009 9:40 AM  Follow-up for Phone Call        I called Mr. Tata on his cell [phone] but phone not accepting calls.  I plan to tell him about the MAP program which he will likely be able to use until his Medicare D comes about.   I will try again in the AM.  Dorothe Pea  Dec 03, 2009 2:41 PM   his new nell nimber is [phone], thanks Lupita Leash. Jamison Neighbor RD,CDE  Dec 03, 2009 4:21 PM   Additional Follow-up for Phone Call Additional follow up Details #1::        F/U with Mr. Schippers.   Income is 1,108 per month plus wife's income who he then tells me he is separated from.  MR. Steuber reports that his Medicaid is officially ending on June 31st due to overincome.  His plan is that he is going to get on his wife's health insurance plan through her job effective July1st.  He will contact me or Scherrie Gerlach if this plan does not work out.  I've also told him about MAP but it is unclear if he will qualify due to grey area concerning his marital status and also he recd 18,000 back pay from [facility] of which he tells me he only has a few thousand left.  SW as needed. Dorothe Pea  Dec 06, 2009 11:05 AM      New/Updated Medications: NOVOLOG MIX  70/30 FLEXPEN 70-30 % SUSP (INSULIN ASPART PROT & ASPART) Inject 63 units subcutaneously before breakfast, 63 units before lunch, and 68 units before supper

## 2010-08-12 NOTE — Letter (Signed)
Summary: BLOOD GLUCOSE   BLOOD GLUCOSE   Imported By: Margie Billet 11/14/2009 15:08:30  _____________________________________________________________________  External Attachment:    Type:   Image     Comment:   External Document

## 2010-08-12 NOTE — Progress Notes (Signed)
Summary: refill/ hla  Phone Note Refill Request Message from:  Fax from Pharmacy on September 05, 2009 3:53 PM  Refills Requested: Medication #1:  FUROSEMIDE 20 MG TABS Take 1 tablet by mouth once a day this is new script for this pharm last office visit 08/28/2009... last cmet 2/16  Initial call taken by: Marin Roberts RN,  September 05, 2009 3:59 PM  Follow-up for Phone Call        Refilled electronically.  Follow-up by: Margarito Liner MD,  September 06, 2009 11:21 AM    Prescriptions: FUROSEMIDE 20 MG TABS (FUROSEMIDE) Take 1 tablet by mouth once a day  #30 x 6   Entered and Authorized by:   Margarito Liner MD   Signed by:   Margarito Liner MD on 09/06/2009   Method used:   Electronically to        [facility] [address] (retail)       [address]       [place], [place]  [address]       Ph: [phone]       Fax: [phone]   RxID:   [id number]

## 2010-08-12 NOTE — Progress Notes (Signed)
Summary: CBGs per your request/dmr  Phone Note Call from Patient Call back at Home Phone [phone]   Caller: Patient Summary of Call: Patient called in blood sugars accoridng to DR. Joines requst. was not at home to give specific numbers, but said blood sugars are mostly high in the 200-300s, but one 95 yesterday because he hadn;t eaten as much". Asked him 297-to call me with specific numbers for 2-3 days when he gets meter. Per return call form patient who read readings form meter memory: [address] 297 28 day 320  4/12 382- 9 am, about to take insulin and CBg now... 10/21/09 ;  297 813 pm,     251 pm-311,       95- 1210 pm                          7:59am- 300 10/20/09  - 338,   ~7pm       319 pm  299,                             10.20 am-294 4/9 /11  -  288,    ~ 8pm    317 pm- 250,                                                     856am- 348      Patient denies  signs or symptoms of low blood sugar: dizziness, extreme hunger, fast heartbeat, shakey, etc...  Told him I would put these numbers on Dr.Joines desktop and he should hear back from [place] in the next day or so. Could consider increasing lunch and dinner doses as his breakfast meal is variable times and amounts as can be seen in his records. Appears overall CBg control is improving by decreasing average Initial call taken by: Jamison Neighbor RD,CDE,  October 22, 2009 2:23 PM  Follow-up for Phone Call        called patient and left message on 10/25/09 and again today- left message about his increased insulin doses. He returned call and acknowledged new lunch and dinner doses.  Follow-up by: Jamison Neighbor RD,CDE,  October 29, 2009 2:49 PM    New/Updated Medications: NOVOLOG MIX 70/30 FLEXPEN 70-30 % SUSP (INSULIN ASPART PROT & ASPART) Inject 55 units subcutaneously before breakfast, 60 units before lunch, and 60 units before supper   Impression & Recommendations:  Problem # 1:  DIABETES MELLITUS, TYPE II, UNCONTROLLED (ICD-250.02) I  discussed patient's recent blood sugars with Jamison Neighbor, and plan is to increase his lunchtime and suppertime insulin by 5 units, and have patient bring meter in next week for review.  His updated medication list for this problem includes:    Glipizide 5 Mg Tabs (Glipizide) .Marland Kitchen... Take 2 tablets by mouth each morning and 2 tablets each afternoon.    Novolog Mix 70/30 Flexpen 70-30 % Susp (Insulin aspart prot & aspart) ..... Inject 55 units subcutaneously before breakfast, 60 units before lunch, and 60 units before supper  Medications Added to Medication List This Visit: 1)  Novolog Mix 70/30 Flexpen 70-30 % Susp (Insulin aspart prot & aspart) .... Inject 55 units subcutaneously before breakfast, 60 units before lunch, and 60 units before supper

## 2010-08-12 NOTE — Consult Note (Signed)
Summary: [name]   Imported By: Margie Billet 12/26/2009 10:35:01  _____________________________________________________________________  External Attachment:    Type:   Image     Comment:   External Document  Appended Document: [name]    Diabetic Eye Exam  Procedure date:  12/23/2009  Findings:      Diabetic background retinopathy Cataracts Glaucoma suspect Hyperopia, astigmatism, presbyopia Exam by [name], DO  Procedures Next Due Date:    Diabetic Eye Exam: 03/2010

## 2010-08-12 NOTE — Consult Note (Signed)
[facility]   Imported By: Louretta Parma 02/06/2010 11:46:26  _____________________________________________________________________  External Attachment:    Type:   Image     Comment:   External Document

## 2010-08-12 NOTE — Miscellaneous (Signed)
Summary: Change in Insulin Regimen  Clinical Lists Changes  Problems: Assessed DM W/COMPLICATION NOS, TYPE II as comment only - I discussed patient with Jamison Neighbor, who saw him last week.  Plan is to change to three times a day insulin regimen, starting at 50 units three times a day (current total daily dose is 140 units per day).    His updated medication list for this problem includes:    Glipizide 5 Mg Tabs (Glipizide) .Marland Kitchen... Take 2 tablets by mouth each morning and 2 tablets each afternoon.    Novolog Mix 70/30 Flexpen 70-30 % Susp (Insulin aspart prot & aspart) ..... Inject 50 units subcutaneously three times a day before meals  Medications: Changed medication from NOVOLOG MIX 70/30 FLEXPEN 70-30 % SUSP (INSULIN ASPART PROT & ASPART) Inject 65 units subcutaneously before breakfast and 75 units before supper. 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  Plan is to change to three times a day insulin regimen, starting at 50 units three times a day (current total daily dose is 140 units per day).    His updated medication list for this problem includes:    Glipizide 5 Mg Tabs (Glipizide) .Marland Kitchen... Take 2 tablets by mouth each morning and 2 tablets each afternoon.    Novolog Mix 70/30 Flexpen 70-30 % Susp (Insulin aspart prot & aspart) ..... Inject 50 units subcutaneously three times a day before meals  Medications Added to Medication List This Visit: 1)  Novolog Mix 70/30 Flexpen 70-30 % Susp (Insulin aspart prot & aspart) .... Inject 50 units subcutaneously three times a day before meals

## 2010-08-12 NOTE — Consult Note (Signed)
[facility]   Imported By: Margie Billet 01/16/2010 13:36:03  _____________________________________________________________________  External Attachment:    Type:   Image     Comment:   External Document

## 2010-08-12 NOTE — Assessment & Plan Note (Signed)
Summary: EST-ROUTINE CHECKUP/CH   Vital Signs:  Patient profile:   60 year old male Height:      78 inches (198.12 cm) Weight:      334.0 pounds (151.82 kg) BMI:     38.74 Temp:     97.6 degrees F (36.44 degrees C) oral Pulse rate:   100 / minute BP sitting:   130 / 82  (left arm) Cuff size:   large  Vitals Entered By: Theotis Barrio NT II (January 01, 2010 8:41 AM) CC: PAIN IN FEET AND LEGS  #  8  / ROUTINE OFFICE VISIT /  MEDICATION CHANGES  /  SOB AT TIMES (PER PATIENT) Is Patient Diabetic? Yes Did you bring your meter with you today? Yes Pain Assessment Patient in pain? yes     Location: FEET/ LEGS Intensity:      8 Type: sharp Onset of pain  Chronic Nutritional Status BMI of > 30 = obese CBG Result 334  Have you ever been in a relationship where you felt threatened, hurt or afraid?No   Does patient need assistance? Functional Status Self care Ambulation Normal Comments PAIN FEET / LEGS  #8  /  ROUTINE OFFICE VISIT  /  MEDICATION CHANGES   Primary Care Provider:  Margarito Liner MD  CC:  PAIN IN FEET AND LEGS  #  8  / ROUTINE OFFICE VISIT /  MEDICATION CHANGES  /  SOB AT TIMES (PER PATIENT).  History of Present Illness: Patient returns for follow-up of his uncontrolled diabetes, peripheral neuropathy, chronic pain, and other chronic medical problems.  He has no acute complaints today.  He recently saw Domingo Pulse for endocrinology consultation on December 26, 2009; Dr. Sharl Ma stopped his glipizide and changed his insulin to a regimen of levemir 100 units each morning, and NovoLog 30 units at meals plus 1 extra unit for every 10 mg/dL above 161 mg/dL.  Patient reports that he has been following that regimen.  He continues to be followed at the foot center, and reports that the ulcer on his right heel has essentially healed; he still has a dressing applied weekly to the left foot ulcer.  Today he has no acute complaints.  Preventive Screening-Counseling &  Management  Alcohol-Tobacco     Alcohol drinks/day: 0     Alcohol type: An occasional beer; no regular use     Smoking Status: quit     Smoking Cessation Counseling: yes     Packs/Day: 1/20     Year Started: 1970     Cans of tobacco/week: no  Caffeine-Diet-Exercise     Does Patient Exercise: yes     Type of exercise: WALKING     Times/week: 7  Current Medications (verified): 1)  Hydrocodone-Acetaminophen 5-325 Mg Tabs (Hydrocodone-Acetaminophen) .... Take 1and 1/2 Tablets By Mouth Every 6 Hours As Needed For Pain 2)  Amitriptyline Hcl 75 Mg  Tabs (Amitriptyline Hcl) .... Take 1 Tablet By Mouth Once A Day 3)  Viagra 50 Mg Tabs (Sildenafil Citrate) .... Take One Tablet By Mouth 1 Hour Before Sexual Activity.  Do Not Take More Than 1 Per Day. 4)  Lipitor 20 Mg  Tabs (Atorvastatin Calcium) .... Take 1 Tablet By Mouth Once A Day 5)  Prilosec 20 Mg Cpdr (Omeprazole) .... Take 1 Tablet By Mouth Once A Day 6)  Truetrack Test  Strp (Glucose Blood) .... Use To Test Blood Sugar Before Meals and At Bedtime. 7)  Prodigy Twist Top Lancets 28g  Misc (Lancets) .Marland KitchenMarland KitchenMarland Kitchen  Use To Test Blood Sugars Before Meals and At Bedtime Each Day 8)  Bd Insulin Syringe Ultrafine 31g X 5/16" 1 Ml Misc (Insulin Syringe-Needle U-100) .... Use As Directed For Once Daily Insulin Injection 9)  Furosemide 20 Mg Tabs (Furosemide) .... Take 2 Tablets By Mouth Three Times A Day 10)  Prodigy Short Pen Needles 31g X 8 Mm Misc (Insulin Pen Needle) .... Use As Directed For Insulin Injection Two Times A Day 11)  Walker  Misc (Misc. Devices) .... Use As Directed. 12)  Neurontin 100 Mg Caps (Gabapentin) .... Take 1 Tablet By Mouth Three Times A Day 13)  Aspir-Low 81 Mg Tbec (Aspirin) .... Take 1 Tablet By Mouth Once A Day 14)  Levemir 100 Unit/ml Soln (Insulin Detemir) .... Inject 100 Units Subcutaneously Once A Day Each Morning 15)  Novolog Flexpen 100 Unit/ml Soln (Insulin Aspart) .... Inject 30 Units Subcutaneously At Meals Plus 1 Unit  For Every 10 Mg/dl Above 347 Mg/dl Three Times A Day At Meals  Allergies (verified): 1)  ! Vancomycin  Review of Systems CV:  Denies chest pain or discomfort, difficulty breathing at night, difficulty breathing while lying down, and swelling of feet; occasional exertional dyspnea. Resp:  Denies shortness of breath. GI:  Denies abdominal pain, nausea, and vomiting. MS:  Denies muscle aches and cramps.  Physical Exam  General:  alert, no distress Lungs:  normal respiratory effort, normal breath sounds, no crackles, and no wheezes.   Heart:  normal rate, regular rhythm, no murmur, no gallop, and no rub.   Abdomen:  soft, non-tender, normal bowel sounds, no hepatomegaly, and no splenomegaly.   Extremities:  no edema; ulcer on right heel is healed; dressing in place on left foot (changed weekly at foot center)  Diabetes Management Exam:    Foot Exam (with socks and/or shoes not present):       Sensory-Monofilament:          Right foot: abnormal   Impression & Recommendations:  Problem # 1:  DIABETES MELLITUS, TYPE II, UNCONTROLLED (ICD-250.02) Patient is doing well on the regimen instituted by Dr. Sharl Ma. Plan is to continue current medication regimen.  The following medications were removed from the medication list:    Glipizide 5 Mg Tabs (Glipizide) .Marland Kitchen... Take 2 tablets by mouth each morning and 2 tablets each afternoon.    Novolog Mix 70/30 Flexpen 70-30 % Susp (Insulin aspart prot & aspart) ..... Inject 66 units subcutaneously before breakfast, 66 units before lunch, and 68 units before supper His updated medication list for this problem includes:    Aspir-low 81 Mg Tbec (Aspirin) .Marland Kitchen... Take 1 tablet by mouth once a day    Levemir 100 Unit/ml Soln (Insulin detemir) ..... Inject 100 units subcutaneously once a day each morning    Novolog Flexpen 100 Unit/ml Soln (Insulin aspart) ..... Inject 30 units subcutaneously at meals plus 1 unit for every 10 mg/dl above 425 mg/dl three times a  day at meals  Labs Reviewed: Creat: 1.42 (11/28/2009)     Last Eye Exam: Background diabetic retinopathy. Exam by Beulah Gandy. Ashley Royalty, MD (08/29/2009) Reviewed HgBA1c results: 11.0 (10/16/2009)  10.8 (08/28/2009)  Problem # 2:  HYPERLIPIDEMIA (ICD-272.2) Patient is at goal on his current dose of Lipitor without apparent problems.  His updated medication list for this problem includes:    Lipitor 20 Mg Tabs (Atorvastatin calcium) .Marland Kitchen... Take 1 tablet by mouth once a day  Labs Reviewed: SGOT: 25 (08/28/2009)   SGPT: 30 (08/28/2009)  HDL:32 (11/12/2009), 28 (06/04/2009)  LDL:61 (11/12/2009), 65 (06/04/2009)  Chol:138 (11/12/2009), 132 (06/04/2009)  Trig:226 (11/12/2009), 193 (06/04/2009)  Problem # 3:  DIABETIC FOOT ULCER (ICD-250.80) [facility].  The following medications were removed from the medication list:    Glipizide 5 Mg Tabs (Glipizide) .Marland Kitchen... Take 2 tablets by mouth each morning and 2 tablets each afternoon.    Novolog Mix 70/30 Flexpen 70-30 % Susp (Insulin aspart prot & aspart) ..... Inject 66 units subcutaneously before breakfast, 66 units before lunch, and 68 units before supper His updated medication list for this problem includes:    Aspir-low 81 Mg Tbec (Aspirin) .Marland Kitchen... Take 1 tablet by mouth once a day    Levemir 100 Unit/ml Soln (Insulin detemir) ..... Inject 100 units subcutaneously once a day each morning    Novolog Flexpen 100 Unit/ml Soln (Insulin aspart) ..... 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  Complete Medication List: 1)  Hydrocodone-acetaminophen 5-325 Mg Tabs (Hydrocodone-acetaminophen) .... Take 1and 1/2 tablets by mouth every 6 hours as needed for pain 2)  Amitriptyline  Hcl 75 Mg Tabs (Amitriptyline hcl) .... Take 1 tablet by mouth once a day 3)  Viagra 50 Mg Tabs (Sildenafil citrate) .... Take one tablet by mouth 1 hour before sexual activity.  do not take more than 1 per day. 4)  Lipitor 20 Mg Tabs (Atorvastatin calcium) .... Take 1 tablet by mouth once a day 5)  Prilosec 20 Mg Cpdr (Omeprazole) .... Take 1 tablet by mouth once a day 6)  Truetrack Test Strp (Glucose blood) .... Use to test blood sugar before meals and at bedtime. 7)  Prodigy Twist Top Lancets 28g Misc (Lancets) .... Use to test blood sugars before meals and at bedtime each day 8)  Bd Insulin Syringe Ultrafine 31g X 5/16" 1 Ml Misc (Insulin syringe-needle u-100) .... Use as directed for once daily insulin injection 9)  Furosemide 20 Mg Tabs (Furosemide) .... Take 2 tablets by mouth three times a day 10)  Prodigy Short Pen Needles 31g X 8 Mm Misc (Insulin pen needle) .... Use as directed for insulin injection two times a day 11)  Walker Misc (Misc. devices) .... Use as directed. 12)  Neurontin 100 Mg Caps (Gabapentin) .... Take 1 tablet by mouth three times a day 13)  Aspir-low 81 Mg Tbec (Aspirin) .... Take 1 tablet by mouth once a day 14)  Levemir 100 Unit/ml Soln (Insulin detemir) .... Inject 100 units subcutaneously once a day each morning 15)  Novolog Flexpen 100 Unit/ml Soln (Insulin aspart) .... Inject 30 units subcutaneously at meals plus 1 unit for every 10 mg/dl above 096 mg/dl three times a day at meals  Other Orders: T-Comprehensive Metabolic Panel ([id number])   Patient Instructions: 1)  Please schedule a follow-up appointment in 2 months. 2)  Please follow up with Dr. Sharl Ma as scheduled.  Prescriptions: HYDROCODONE-ACETAMINOPHEN 5-325 MG TABS (HYDROCODONE-ACETAMINOPHEN) Take 1and 1/2 tablets by mouth every 6 hours as needed for pain  #180 x 2   Entered and Authorized by:   Margarito Liner MD   Signed by:   Margarito Liner MD on 01/01/2010   Method used:   Print then Give to  Patient   RxID:   [id number]  Process Orders Check Orders Results:     [facility]: ABN not required for this insurance Tests Sent for requisitioning (January 08, 2010 2:38 PM):     01/01/2010: [facility] -- T-Comprehensive Metabolic Panel [[id number]] (signed)    Prevention & Chronic Care Immunizations   Influenza vaccine: Fluvax 3+  (05/03/2009)   Influenza vaccine deferral: Not available  (02/13/2009)   Influenza vaccine due: 03/13/2010    Tetanus booster: 02/20/2009: Td   Tetanus booster due: 02/21/2019    Pneumococcal vaccine: Pneumovax  (08/28/2009)   Pneumococcal vaccine due: 03/04/2015  Colorectal Screening   Hemoccult: negative x 3  (07/04/2008)   Hemoccult action/deferral: Not indicated  (12/12/2009)   Hemoccult due: 07/2009    Colonoscopy: (1) Diverticula (2) Medium hemorrhoids Exam by Dr. Jordan Hawks. Hung  (09/27/2008)   Colonoscopy due: 10/2018  Other Screening   PSA: 0.58  (08/28/2009)   PSA action/deferral: Discussed-PSA requested  (08/28/2009)   Smoking status: quit  (01/01/2010)  Diabetes Mellitus   HgbA1C: 11.0  (10/16/2009)   HgbA1C action/deferral: Ordered  (02/13/2009)   Hemoglobin A1C due: 11/12/2007    Eye exam: Background diabetic retinopathy. Exam by Beulah Gandy. Ashley Royalty, MD  (08/29/2009)   Diabetic eye exam action/deferral: Ophthalmology referral  (02/20/2009)   Eye exam due: 02/26/2009    Foot exam: yes  (01/01/2010)   Foot exam action/deferral: Do today   High risk foot: Yes  (01/01/2010)   Foot care education: Done  (01/01/2010)   Foot exam due: 04/03/2010    Urine microalbumin/creatinine ratio: 4.6  (11/12/2009)   Urine microalbumin action/deferral: Ordered   Urine microalbumin/cr due: 12/06/2008    Diabetes flowsheet reviewed?: Yes   Progress toward A1C goal: Unchanged  Lipids   Total Cholesterol: 138  (11/12/2009)   Lipid panel action/deferral: Lipid Panel ordered   LDL: 61   (11/12/2009)   LDL Direct: Not documented   HDL: 32  (11/12/2009)   Triglycerides: 226  (11/12/2009)   Lipid panel due: 03/15/2008    SGOT (AST): 25  (08/28/2009)   BMP action: Ordered   SGPT (ALT): 30  (08/28/2009) CMP ordered    Alkaline phosphatase: 141  (08/28/2009)   Total bilirubin: 0.6  (08/28/2009)    Lipid flowsheet reviewed?: Yes   Progress toward LDL goal: At goal  Self-Management Support :   Personal Goals (by the next clinic visit) :     Personal A1C goal: 7  (04/09/2009)     Personal blood pressure goal: 130/80  (04/09/2009)     Personal LDL goal: 100  (04/09/2009)    Patient will work on the following items until the next clinic visit to reach self-care goals:     Medications and monitoring: take my medicines every day, check my blood sugar, bring all of my medications to every visit, examine my feet every day  (01/01/2010)     Eating: drink diet soda or water instead of juice or soda, eat more vegetables, use fresh or frozen vegetables, eat foods that are low in salt, eat baked foods instead of fried foods, eat fruit for snacks and desserts, limit or avoid alcohol  (01/01/2010)     Activity: take a 30 minute walk every day  (01/01/2010)     Home glucose monitoring frequency: 3 times a day  (06/04/2009)    Diabetes self-management support: Resources for patients handout, Written self-care plan  (01/01/2010)   Diabetes care plan printed   Last diabetes self-management training by diabetes educator: 09/25/2009  Last medical nutrition therapy: 06/28/2008    Lipid self-management support: Resources for patients handout, Written self-care plan  (01/01/2010)   Lipid self-care plan printed.    Self-management comments: WALKING WHEN ABLE      Resource handout printed.   Nursing Instructions: Diabetic foot exam today    Diabetic Foot Exam Foot Inspection Is there a history of a foot ulcer?              Yes Is there a foot ulcer now?              Yes Can the  patient see the bottom of their feet?          No Are the shoes appropriate in style and fit?          Yes Is there swelling or an abnormal foot shape?          No Are the toenails long?                No Are the toenails thick?                No Are the toenails ingrown?              No Is there heavy callous build-up?              Yes Is there pain in the calf muscle (Intermittent claudication) when walking?    NoIs there a claw toe deformity?              No Is there elevated skin temperature?            No Is there limited ankle dorsiflexion?            No Is there foot or ankle muscle weakness?            No  Diabetic Foot Care Education Patient educated on appropriate care of diabetic feet.  Comments: dressing in place on left foot (changed weekly at foot center) High Risk Feet? Yes Set Next Diabetic Foot Exam here: 04/03/2010   10-g (5.07) Semmes-Weinstein Monofilament Test Performed by: Margarito Liner MD          Right Foot          Left Foot Site 1         abnormal          Site 4         abnormal          Site 5         abnormal          Site 6         abnormal           Impression      abnormal

## 2010-08-12 NOTE — Letter (Signed)
Summary: BLOOD GLUCOSE REPORT  BLOOD GLUCOSE REPORT   Imported By: Margie Billet 12/10/2009 10:48:39  _____________________________________________________________________  External Attachment:    Type:   Image     Comment:   External Document

## 2010-08-12 NOTE — Progress Notes (Signed)
Summary: SCAT  Phone Note Other Incoming   Summary of Call: Mr. Slinger has called in to inform us that he finally qualified for SCAT.

## 2010-08-12 NOTE — Assessment & Plan Note (Signed)
Summary: f/u [mkj]   Vital Signs:  Patient profile:   60 year old male Height:      78 inches Weight:      330.6 pounds BMI:     38.34 Temp:     97.2 degrees F oral Pulse rate:   95 / minute BP sitting:   124 / 77  (right arm)  Vitals Entered By: Filomena Jungling NT II (August 28, 2009 9:04 AM) CC: checkup Is Patient Diabetic? Yes Did you bring your meter with you today? Yes CBG Result 220    Primary Care Provider:  Margarito Liner MD  CC:  checkup.  History of Present Illness: Patient presents for follow-up of his diabetes mellitus, hypertension, hyperlipidemia, and other chronic medical problems. He reports increased swelling in his feet, and also complains of pain in his feet that is somewhat worse.  He reports that he is compliant with his medications.  His blood sugars have been persistently elevated.  Preventive Screening-Counseling & Management  Alcohol-Tobacco     Alcohol drinks/day: 0     Alcohol type: An occasional beer; no regular use     Smoking Status: current     Smoking Cessation Counseling: yes     Packs/Day: 1/20     Year Started: 1970     Cans of tobacco/week: no  Caffeine-Diet-Exercise     Does Patient Exercise: yes     Type of exercise: WALKING     Times/week: 7  Current Medications (verified): 1)  Glipizide 5 Mg  Tabs (Glipizide) .... Take 2 Tablets By Mouth Each Morning and 2 Tablets Each Afternoon. 2)  Hydrocodone-Acetaminophen 5-325 Mg Tabs (Hydrocodone-Acetaminophen) .... Take 1and 1/2 Tablets By Mouth Every 6 Hours As Needed For Pain 3)  Amitriptyline Hcl 75 Mg  Tabs (Amitriptyline Hcl) .... Take 1 Tablet By Mouth Once A Day 4)  Viagra 50 Mg Tabs (Sildenafil Citrate) .... Take One Tablet By Mouth 1 Hour Before Sexual Activity.  Do Not Take More Than 1 Per Day. 5)  Lipitor 20 Mg  Tabs (Atorvastatin Calcium) .... Take 1 Tablet By Mouth Once A Day 6)  Novolog Mix 70/30 Flexpen 70-30 % Susp (Insulin Aspart Prot & Aspart) .... Inject 55 Units  Subcutaneously Before Breakfast and 65 Units Before Supper. 7)  Prilosec 20 Mg Cpdr (Omeprazole) .... Take 1 Tablet By Mouth Once A Day 8)  Truetrack Blood Glucose  Devi (Blood Glucose Monitoring Suppl) .... Use Meter With Test Strips To Check Blood Sugar Before Meals and At Bedtime. 9)  Truetrack Test  Strp (Glucose Blood) .... Use To Test Blood Sugar Before Meals and At Bedtime. 10)  Prodigy Twist Top Lancets 28g  Misc (Lancets) .... Use To Test Blood Sugars Before Meals and At Bedtime Each Day 11)  Prodigy Insulin Syringe 28g X 1/2" 1 Ml Misc (Insulin Syringe-Needle U-100) .... Use To Injection Insulin Twice Daily 12)  Furosemide 20 Mg Tabs (Furosemide) .... Take 1 Tablet By Mouth Once A Day 13)  Actos 15 Mg Tabs (Pioglitazone Hcl) .... Take 1 Tablet By Mouth Once A Day 14)  Novofine 30g X 8 Mm Misc (Insulin Pen Needle) .... Use As Directed For Insulin Injection Two Times A Day  Allergies (verified): 1)  ! Vancomycin  Review of Systems CV:  Complains of shortness of breath with exertion; denies chest pain or discomfort, difficulty breathing at night, and difficulty breathing while lying down. GI:  Denies abdominal pain, nausea, and vomiting. GU:  Denies dysuria. MS:  Denies  muscle aches and cramps.  Physical Exam  General:  alert, no distress Lungs:  normal respiratory effort, normal breath sounds, no crackles, and no wheezes.   Heart:  normal rate, regular rhythm, no murmur, no gallop, and no rub.   Abdomen:  soft, non-tender, normal bowel sounds, no hepatomegaly, and no splenomegaly.   Extremities:  2+ bilateral pitting ankle/pedal edema  Diabetes Management Exam:    Foot Exam (with socks and/or shoes not present):       Sensory-Monofilament:          Left foot: abnormal          Right foot: abnormal   Impression & Recommendations:  Problem # 1:  DIABETES MELLITUS, TYPE II, UNCONTROLLED (ICD-250.02) Patient's hemoglobin A1c is somewhat better following the addition of Actos,  although his diabetes remains poorly controlled. He has developed recurrent lower extremity edema on the Actos. I discussed his situation with our diabetes educator Jamison Neighbor, and the plan is to stop the Actos and increase his NovoLog mix 7030 insulin as below. If he remains poorly controlled, the plan will be to titrate up his insulin regimen. Will also schedule followup with Jamison Neighbor.  The following medications were removed from the medication list:    Actos 15 Mg Tabs (Pioglitazone hcl) .Marland Kitchen... Take 1 tablet by mouth once a day His updated medication list for this problem includes:    Glipizide 5 Mg Tabs (Glipizide) .Marland Kitchen... Take 2 tablets by mouth each morning and 2 tablets each afternoon.    Novolog Mix 70/30 Flexpen 70-30 % Susp (Insulin aspart prot & aspart) ..... Inject 60 units subcutaneously before breakfast and 70 units before supper.  Labs Reviewed: Creat: 1.63 (06/04/2009)     Last Eye Exam: Background diabetic retinopathy. Exam by [facility] (02/27/2008) Reviewed HgBA1c results: 10.8 (08/28/2009)  11.9 (06/04/2009)  Orders: T-Hgb A1C (in-house) (14782NF) T- Capillary Blood Glucose (62130)  Problem # 2:  HYPERLIPIDEMIA (ICD-272.2) Patient's LDL is at target on current dose of Lipitor, and he is tolerating that medication without problems. The plan is to recheck his triglycerides in a few months; these hopefully will improve as his diabetes control improves.  His updated medication list for this problem includes:    Lipitor 20 Mg Tabs (Atorvastatin calcium) .Marland Kitchen... Take 1 tablet by mouth once a day  Labs Reviewed: SGOT: 18 (06/04/2009)   SGPT: 25 (06/04/2009)   HDL:28 (06/04/2009), 26 (06/28/2008)  LDL:65 (06/04/2009), 57 (06/28/2008)  Chol:132 (06/04/2009), 147 (06/28/2008)  Trig:193 (06/04/2009), 322 (06/28/2008)  Problem # 3:  FOOT PAIN (ICD-729.5) Patient has chronic diabetic peripheral neuropathy, and his pain has worsened somewhat with his worsening edema.  The plan is to continue current analgesic regimen.  Problem # 4:  RENAL INSUFFICIENCY, CHRONIC (ICD-585.9) Will check labs as below.  Labs Reviewed: BUN: 22 (06/04/2009)   Cr: 1.63 (06/04/2009)    Hgb: 13.5 (06/28/2008)   Hct: 42.2 (06/28/2008)   Ca++: 9.0 (06/04/2009)    TP: 6.8 (06/04/2009)   Alb: 4.0 (06/04/2009)  Orders: T-Comprehensive Metabolic Panel ([id number]) T-CBC w/Diff (95284-13244)  Problem # 5:  CALLUS, FOOT (ICD-700) Patient has seen a podiatrist and will followup with him; apparently, he was referred to have more appropriate shoes fitted.  Complete Medication List: 1)  Glipizide 5 Mg Tabs (Glipizide) .... Take 2 tablets by mouth each morning and 2 tablets each afternoon. 2)  Hydrocodone-acetaminophen 5-325 Mg Tabs (Hydrocodone-acetaminophen) .... Take 1and 1/2 tablets by mouth every 6 hours as needed for pain 3)  Amitriptyline Hcl 75 Mg Tabs (Amitriptyline hcl) .... Take 1 tablet by mouth once a day 4)  Viagra 50 Mg Tabs (Sildenafil citrate) .... Take one tablet by mouth 1 hour before sexual activity.  do not take more than 1 per day. 5)  Lipitor 20 Mg Tabs (Atorvastatin calcium) .... Take 1 tablet by mouth once a day 6)  Novolog Mix 70/30 Flexpen 70-30 % Susp (Insulin aspart prot & aspart) .... Inject 60 units subcutaneously before breakfast and 70 units before supper. 7)  Prilosec 20 Mg Cpdr (Omeprazole) .... Take 1 tablet by mouth once a day 8)  Truetrack Blood Glucose Devi (Blood glucose monitoring suppl) .... Use meter with test strips to check blood sugar before meals and at bedtime. 9)  Truetrack Test Strp (Glucose blood) .... Use to test blood sugar before meals and at bedtime. 10)  Prodigy Twist Top Lancets 28g Misc (Lancets) .... Use to test blood sugars before meals and at bedtime each day 11)  Prodigy Insulin Syringe 28g X 1/2" 1 Ml Misc (Insulin syringe-needle u-100) .... Use to injection insulin twice daily 12)  Furosemide 20 Mg Tabs (Furosemide) .... Take  1 tablet by mouth once a day 13)  Novofine 30g X 8 Mm Misc (Insulin pen needle) .... Use as directed for insulin injection two times a day  Other Orders: T-PSA Total ([address]) Pneumococcal Vaccine (40981) Admin 1st Vaccine (19147)  Patient Instructions: 1)  Please schedule a follow-up appointment in 1 month. 2)  Please schedule an appointment with diabetes educator Jamison Neighbor. 3)  Stop Actos. 4)  Increase Novolog mix 70/30 insulin to a dose of 60 units before breakfast and 70 units before supper. 5)  Call if leg swelling does not improve.  Prevention & Chronic Care Immunizations   Influenza vaccine: Fluvax 3+  (05/03/2009)   Influenza vaccine deferral: Not available  (02/13/2009)   Influenza vaccine due: 03/13/2010    Tetanus booster: 02/20/2009: Td   Tetanus booster due: 02/21/2019    Pneumococcal vaccine: Pneumovax  (08/28/2009)   Pneumococcal vaccine due: 03/04/2015  Colorectal Screening   Hemoccult: negative x 3  (07/04/2008)   Hemoccult due: 07/2009    Colonoscopy: (1) Diverticula (2) Medium hemorrhoids Exam by Dr. Jordan Hawks. Hung  (09/27/2008)   Colonoscopy due: 10/2018  Other Screening   PSA: 0.35  (09/12/2008)   PSA ordered.   PSA action/deferral: Discussed-PSA requested  (08/28/2009)   Smoking status: current  (08/28/2009)   Smoking cessation counseling: yes  (08/28/2009)   Target quit date: 06/04/2009  (06/04/2009)  Diabetes Mellitus   HgbA1C: 10.8  (08/28/2009)   HgbA1C action/deferral: Ordered  (02/13/2009)   Hemoglobin A1C due: 11/12/2007    Eye exam: Background diabetic retinopathy. Exam by [facility]  (02/27/2008)   Diabetic eye exam action/deferral: Ophthalmology referral  (02/20/2009)   Eye exam due: 02/26/2009    Foot exam: yes  (08/28/2009)   Foot exam action/deferral: Do today   High risk foot: Yes  (08/28/2009)   Foot care education: Done  (08/28/2009)   Foot exam due: 11/25/2009    Urine microalbumin/creatinine  ratio: 8.5  (02/20/2009)   Urine microalbumin action/deferral: Ordered   Urine microalbumin/cr due: 12/06/2008    Diabetes flowsheet reviewed?: Yes   Progress toward A1C goal: Improved   Diabetes comments: Has appt with eye doctor tomorrow morning  Lipids   Total Cholesterol: 132  (06/04/2009)   Lipid panel action/deferral: Lipid Panel ordered   LDL: 65  (06/04/2009)   LDL Direct:  Not documented   HDL: 28  (06/04/2009)   Triglycerides: 193  (06/04/2009)   Lipid panel due: 03/15/2008    SGOT (AST): 18  (06/04/2009)   BMP action: Ordered   SGPT (ALT): 25  (06/04/2009) CMP ordered    Alkaline phosphatase: 142  (06/04/2009)   Total bilirubin: 0.6  (06/04/2009)    Lipid flowsheet reviewed?: Yes   Progress toward LDL goal: At goal  Self-Management Support :   Personal Goals (by the next clinic visit) :     Personal A1C goal: 7  (04/09/2009)     Personal blood pressure goal: 130/80  (04/09/2009)     Personal LDL goal: 100  (04/09/2009)    Patient will work on the following items until the next clinic visit to reach self-care goals:     Medications and monitoring: take my medicines every day, check my blood sugar, examine my feet every day  (08/28/2009)     Eating: drink diet soda or water instead of juice or soda, eat more vegetables, eat foods that are low in salt, eat baked foods instead of fried foods, eat fruit for snacks and desserts, limit or avoid alcohol  (08/28/2009)     Activity: take a 30 minute walk every day  (08/28/2009)     Home glucose monitoring frequency: 3 times a day  (06/04/2009)    Diabetes self-management support: Copy of home glucose meter record, CBG self-monitoring log, Written self-care plan  (08/28/2009)   Diabetes care plan printed   Last diabetes self-management training by diabetes educator: 02/20/2009   Last medical nutrition therapy: 06/28/2008    Lipid self-management support: Written self-care plan  (08/28/2009)   Lipid self-care plan  printed.   Nursing Instructions: Give Pneumovax today Diabetic foot exam today    Diabetic Foot Exam Foot Inspection Is there a history of a foot ulcer?              No Is there a foot ulcer now?              Yes Can the patient see the bottom of their feet?          Yes Are the shoes appropriate in style and fit?          No Is there swelling or an abnormal foot shape?          Yes Are the toenails long?                No Are the toenails thick?                Yes Are the toenails ingrown?              No Is there heavy callous build-up?              No Is there pain in the calf muscle (Intermittent claudication) when walking?    NoIs there a claw toe deformity?              No Is there elevated skin temperature?            No Is there limited ankle dorsiflexion?            No Is there foot or ankle muscle weakness?            No  Diabetic Foot Care Education Patient educated on appropriate care of diabetic feet.  Pulse Check          Right Foot  Left Foot Posterior Tibial:        normal            normal Dorsalis Pedis:        normal            normal Comments: PATIENT HAS HAD FOOT SURGERY DONE AND IS SEEING  A PODIATRIST.HE HAS FEELING IN THE BOTTM HALF OF BOTH FEET BUT NOT AT THE TOP High Risk Feet? Yes Set Next Diabetic Foot Exam here: 11/25/2009   10-g (5.07) Semmes-Weinstein Monofilament Test Performed by: Filomena Jungling NT II          Right Foot          Left Foot Site 1         abnormal         abnormal Site 2         abnormal         abnormal Site 3         abnormal         abnormal Site 4         abnormal         abnormal Site 5         abnormal         abnormal Site 6         abnormal         abnormal Site 7         normal         normal Site 8         normal         normal Site 9         normal         normal  Impression      abnormal         abnormal   Laboratory Results   Blood Tests   Date/Time Received: August 28, 2009 9:22 AM  Date/Time  Reported: Burke Keels  August 28, 2009 9:22 AM   HGBA1C: 10.8%   (Normal Range: Non-Diabetic - 3-6%   Control Diabetic - 6-8%) CBG Random:: 220mg /dL     Process Orders Check Orders Results:     [facility]: ABN not required for this insurance Tests Sent for requisitioning (August 29, 2009 7:23 PM):     08/28/2009: [facility] -- T-PSA Total [[id number]] (signed)     08/28/2009: [facility] -- T-Comprehensive Metabolic Panel [[id number]] (signed)     08/28/2009: [facility] -- T-CBC w/Diff [[id number]] (signed)    Immunizations Administered:  Pneumonia Vaccine:    Vaccine Type: Pneumovax    Site: right deltoid    Mfr: Merck    Dose: 0.5 ml    Route: IM    Given by: Krystal Eaton (AAMA)    Exp. Date: 02/15/2011    Lot #: [id number]    VIS given: 02/08/96 version given August 28, 2009.

## 2010-08-12 NOTE — Progress Notes (Signed)
Summary: refull request/hydrocodone/dmr  Phone Note Call from Patient Call back at Home Phone [phone]   Caller: Patient Summary of Call: Requests refill on Hydrocodone called to [facility] on [address]: He recently had prescriptions transferred to [address], he thought he had another refil on hydrocodone, but they are telling him he doesn't. They were supposed to contact us. Do we know what happened to the other refill- has pharmacy contacted [place]?    will refer to triage nurse for assistacne since this is not diabetes medicine. Initial call taken by: Jamison Neighbor RD,CDE,  January 31, 2010 2:09 PM

## 2010-08-12 NOTE — Assessment & Plan Note (Signed)
Summary: ACUTE-LEG SWELLING/CFB   Vital Signs:  Patient profile:   60 year old male Height:      78 inches (198.12 cm) Weight:      337.6 pounds (153.45 kg) BMI:     39.15 Temp:     97.1 degrees F (36.17 degrees C) oral Pulse rate:   90 / minute BP sitting:   118 / 73  (left arm) Cuff size:   large  Vitals Entered By: Blenda Mounts (Nov 12, 2009 10:14 AM)/LELA STURDIVANT NTII CC: swelling on both legs x20month, sore on bottom right foot Is Patient Diabetic? Yes Did you bring your meter with you today? Yes Pain Assessment Patient in pain? yes     Location: both legs Intensity: 8 Type: sharp, throbbing Onset of pain  Constant Nutritional Status BMI of > 30 = obese  Does patient need assistance? Functional Status Self care Ambulation Normal   Primary Care Provider:  Margarito Liner MD  CC:  swelling on both legs x73month and sore on bottom right foot.  History of Present Illness: 61 year old gentleman with PMH as mentioned in the EMR comes to the office for the following  1. Patient reports that he follows with a foot doctor for his ulcer on the right foot sole, who does dressing changes every week. Patient reports that he has been having more swelling of legs recently. Patient was seen in the ED as his cast was broken on the right foot and was advised to take lasix 40 two times a day,which he started taking from yesterday. He denies any SOB, orthopnea, PND. He denies any chills, fever, body pains. He reports swelling of legs for the last one month.  2. Patient is also requesting a power wheel chair or a walker.  3. Patient bring his CBG log to the office and most of the AM values are in 300s and all the values are above the 200s range.     Preventive Screening-Counseling & Management  Alcohol-Tobacco     Alcohol drinks/day: 0     Alcohol type: An occasional beer; no regular use     Smoking Status: quit     Smoking Cessation Counseling: yes     Packs/Day: 1/20  Year Started: 1970     Cans of tobacco/week: no  Caffeine-Diet-Exercise     Does Patient Exercise: yes     Type of exercise: WALKING     Times/week: 7  Problems Prior to Update: 1)  Diabetic Foot Ulcer  (ICD-250.80) 2)  Dm W/complication Nos, Type II  (ICD-250.90) 3)  Diabetes Mellitus, Type II, Uncontrolled  (ICD-250.02) 4)  Diabetic Macular Edema  (ICD-362.07) 5)  Background Diabetic Retinopathy  (ICD-362.01) 6)  Peripheral Neuropathy, Lower Extremities, Bilateral  (ICD-355.8) 7)  Foot Pain  (ICD-729.5) 8)  Callus, Toe  (ICD-700) 9)  Callus, Foot  (ICD-700) 10)  Hyperlipidemia  (ICD-272.2) 11)  Renal Insufficiency, Chronic  (ICD-585.9) 12)  Gerd  (ICD-530.81) 13)  Hearing Loss, Sensorineural, Bilateral  (ICD-389.10) 14)  Erectile Dysfunction  (ICD-302.72) 15)  Skin Lesion  (ICD-709.9) 16)  Dizziness  (ICD-780.4) 17)  Tinnitus Nos  (ICD-388.30) 18)  Weight Gain  (ICD-783.1) 19)  Tobacco User  (ICD-305.1) 20)  Osteomyelitis Nos, Ankle/foot  (ICD-730.27) 21)  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 Take 2 Tablets By Mouth Each Morning  and 2 Tablets Each Afternoon. 2)  Hydrocodone-Acetaminophen 5-325 Mg Tabs (Hydrocodone-Acetaminophen) .... Take 1and 1/2 Tablets By Mouth Every 6 Hours As Needed For Pain 3)  Amitriptyline Hcl 75 Mg  Tabs (Amitriptyline Hcl) .... Take 1 Tablet By Mouth Once A Day 4)  Viagra 50 Mg Tabs (Sildenafil Citrate) .... Take One Tablet By Mouth 1 Hour Before Sexual Activity.  Do Not Take More Than 1 Per Day. 5)  Lipitor 20 Mg  Tabs (Atorvastatin Calcium) .... Take 1 Tablet By Mouth Once A Day 6)  Novolog Mix 70/30 Flexpen 70-30 % Susp (Insulin Aspart Prot & Aspart) .... Inject 55 Units Subcutaneously Before Breakfast, 60 Units Before Lunch, and 60 Units Before Supper 7)  Prilosec 20 Mg Cpdr (Omeprazole) .... Take 1  Tablet By Mouth Once A Day 8)  Truetrack Test  Strp (Glucose Blood) .... Use To Test Blood Sugar Before Meals and At Bedtime. 9)  Prodigy Twist Top Lancets 28g  Misc (Lancets) .... Use To Test Blood Sugars Before Meals and At Bedtime Each Day 10)  Prodigy Insulin Syringe 28g X 1/2" 1 Ml Misc (Insulin Syringe-Needle U-100) .... Use To Injection Insulin Twice Daily 11)  Furosemide 20 Mg Tabs (Furosemide) .... Take 2 Tablets By Mouth Once A Day 12)  Prodigy Short Pen Needles 31g X 8 Mm Misc (Insulin Pen Needle) .... Use As Directed For Insulin Injection Two Times A Day 13)  Prodigy Insulin Syringe 28g X 1/2" 1 Ml Misc (Insulin Syringe-Needle U-100) .... Use To Inject Insuln Twice A Day  Current Medications (verified): 1)  Glipizide 5 Mg  Tabs (Glipizide) .... Take 2 Tablets By Mouth Each Morning and 2 Tablets Each Afternoon. 2)  Hydrocodone-Acetaminophen 5-325 Mg Tabs (Hydrocodone-Acetaminophen) .... Take 1and 1/2 Tablets By Mouth Every 6 Hours As Needed For Pain 3)  Amitriptyline Hcl 75 Mg  Tabs (Amitriptyline Hcl) .... Take 1 Tablet By Mouth Once A Day 4)  Viagra 50 Mg Tabs (Sildenafil Citrate) .... Take One Tablet By Mouth 1 Hour Before Sexual Activity.  Do Not Take More Than 1 Per Day. 5)  Lipitor 20 Mg  Tabs (Atorvastatin Calcium) .... Take 1 Tablet By Mouth Once A Day 6)  Novolog Mix 70/30 Flexpen 70-30 % Susp (Insulin Aspart Prot & Aspart) .... Inject 55 Units Subcutaneously Before Breakfast, 60 Units Before Lunch, and 60 Units Before Supper 7)  Prilosec 20 Mg Cpdr (Omeprazole) .... Take 1 Tablet By Mouth Once A Day 8)  Truetrack Test  Strp (Glucose Blood) .... Use To Test Blood Sugar Before Meals and At Bedtime. 9)  Prodigy Twist Top Lancets 28g  Misc (Lancets) .... Use To Test Blood Sugars Before Meals and At Bedtime Each Day 10)  Prodigy Insulin Syringe 28g X 1/2" 1 Ml Misc (Insulin Syringe-Needle U-100) .... Use To Injection Insulin Twice Daily 11)  Furosemide 20 Mg Tabs (Furosemide) ....  Take 2 Tablets By Mouth Two Times A Day 12)  Prodigy Short Pen Needles 31g X 8 Mm Misc (Insulin Pen Needle) .... Use As Directed For Insulin Injection Two Times A Day 13)  Prodigy Insulin Syringe 28g X 1/2" 1 Ml Misc (Insulin Syringe-Needle U-100) .... Use To Inject Insuln Twice A Day  Allergies: 1)  ! Vancomycin  Past History:  Family History: Last updated: 06/27/2008 No colon cancer. Father had MI at age 52. Mother had DM; 2 siblings have DM. No prostate cancer.  Risk Factors: Alcohol Use: 0 (11/12/2009) Exercise: yes (11/12/2009)  Past Medical History: Reviewed history from  04/09/2009 and no changes required. 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 No colon cancer. Father had MI at age 66. Mother had DM; 2 siblings have DM. No prostate cancer.  Social History: Reviewed history from 05/03/2009 and no changes required. Married; no children from current marriage. Unemployed- not looking for work. Signed up for disability - has not got it yet.  Review of Systems      See HPI  Physical Exam  General:   Well-developed,well-nourished,in no acute distress; alert,appropriate and cooperative throughout examination Neck:  No deformities, masses, or tenderness noted. Lungs:  Normal respiratory effort, chest expands symmetrically. Lungs are clear to auscultation, no crackles or wheezes. Heart:  Normal rate and regular rhythm. S1 and S2 normal without gallop, murmur, click, rub or other extra sounds. Abdomen:  Bowel sounds positive,abdomen soft and non-tender without masses, organomegaly or hernias noted. Pulses:  R radial normal.   Extremities:  both lower extremities are in soft cast upto the knee. Patient definitely has atleast 2+ pitting edema upto knees. Neurologic:  alert & oriented X3 and strength normal in all extremities.     Impression & Recommendations:  Problem # 1:  DIABETIC FOOT ULCER (ICD-250.80)  Per patient, the foot doctors are happy with the way it is healing. Management is per podiatrics. Will provide a walker.   His updated medication list for this problem includes:    Glipizide 5 Mg Tabs (Glipizide) .Marland Kitchen... Take 2 tablets by mouth each morning and 2 tablets each afternoon.    Novolog Mix 70/30 Flexpen 70-30 % Susp (Insulin aspart prot & aspart) ..... Inject 60 units subcutaneously before breakfast, 60 units before lunch, and 65 units before supper  Orders: T-Lipid Profile ([id number])  Problem # 2:  DIABETES MELLITUS, TYPE II, UNCONTROLLED (ICD-250.02)  Uncontrolled. Needs close follow up and monitoring. Given that his AM CBG's have all been running in 300-400's range will increase his Bed time dose to 65 units and given that preprandial lunch time values are also in 300's will increase AM dose to 60 units. Will follow up in 2 weeks.  His updated medication list for this problem includes:    Glipizide 5 Mg Tabs (Glipizide) .Marland Kitchen... Take 2 tablets by mouth each morning and 2 tablets each afternoon.    Novolog Mix 70/30 Flexpen 70-30 % Susp (Insulin aspart prot & aspart) .....  Inject 60 units subcutaneously before breakfast, 60 units before lunch, and 65 units before supper  Labs Reviewed: Creat: 1.43 (10/16/2009)     Last Eye Exam: Background diabetic retinopathy. Exam by Beulah Gandy. Ashley Royalty, MD (08/29/2009) Reviewed HgBA1c results: 11.0 (10/16/2009)  10.8 (08/28/2009)  Orders: T-Lipid Profile ([id number])  Problem # 3:  EDEMA (ICD-782.3)  Suspect mild acute on chronic. Patient has been advised to take 40 two times a day in the ED. Will continue 40 two times a day  for two weeks and will follow up in 2 weeks. Will check BNP, BMP to r/o CHF and to check K+. Recommended to drink orange juice daily. Will follow up on labs.  His updated medication list for this problem includes:    Furosemide 20 Mg Tabs (Furosemide) .Marland Kitchen... Take 2 tablets by mouth two times a day  Orders: T-Basic Metabolic Panel ([phone]) T-Lipid Profile [phone]) T-BNP  (B Natriuretic Peptide) [phone]) T-Urine Microalbumin w/creat. ratio [phone])  Problem # 4:  HYPERLIPIDEMIA (ICD-272.2)  Patient is fasting today. Will check FLP.  His updated medication list for this problem includes:    Lipitor 20 Mg Tabs (Atorvastatin calcium) .Marland Kitchen... Take 1 tablet by mouth once a day  Labs Reviewed: SGOT: 25 (08/28/2009)   SGPT: 30 (08/28/2009)   HDL:28 (06/04/2009), 26 (06/28/2008)  LDL:65 (06/04/2009), 57 (06/28/2008)  Chol:132 (06/04/2009), 147 (06/28/2008)  Trig:193 (06/04/2009), 322 (06/28/2008)  Orders: T-Lipid Profile ([id number])  Complete Medication List: 1)  Glipizide 5 Mg Tabs (Glipizide) .... Take 2 tablets by mouth each morning and 2 tablets each afternoon. 2)  Hydrocodone-acetaminophen 5-325 Mg Tabs (Hydrocodone-acetaminophen) .... Take 1and 1/2 tablets by mouth every 6 hours as needed for pain 3)  Amitriptyline Hcl 75 Mg Tabs (Amitriptyline hcl) .... Take 1 tablet by mouth once a day 4)  Viagra 50 Mg Tabs (Sildenafil citrate) .... Take one tablet by mouth 1 hour  before sexual activity.  do not take more than 1 per day. 5)  Lipitor 20 Mg Tabs (Atorvastatin calcium) .... Take 1 tablet by mouth once a day 6)  Novolog Mix 70/30 Flexpen 70-30 % Susp (Insulin aspart prot & aspart) .... Inject 60 units subcutaneously before breakfast, 60 units before lunch, and 65 units before supper 7)  Prilosec 20 Mg Cpdr (Omeprazole) .... Take 1 tablet by mouth once a day 8)  Truetrack Test Strp (Glucose blood) .... Use to test blood sugar before meals and at bedtime. 9)  Prodigy Twist Top Lancets 28g Misc (Lancets) .... Use to test blood sugars before meals and at bedtime each day 10)  Prodigy Insulin Syringe 28g X 1/2" 1 Ml Misc (Insulin syringe-needle u-100) .... Use to injection insulin twice daily 11)  Furosemide 20 Mg Tabs (Furosemide) .... Take 2 tablets by mouth two times a day 12)  Prodigy Short Pen Needles 31g X 8 Mm Misc (Insulin pen needle) .... Use as directed for insulin injection two times a day 13)  Prodigy Insulin Syringe 28g X 1/2" 1 Ml Misc (Insulin syringe-needle u-100) .... Use to inject insuln twice a day 14)  Walker Misc (Misc. devices) .... Use as directed.  Patient Instructions: 1)  Please schedule a follow-up appointment in 2 weeks. 2)  Check your blood sugars regularly. If your readings are usually above : 300 or below 70 you should contact our office. Prescriptions: WALKER  MISC (MISC. DEVICES) use as directed.  #1 x 0   Entered and Authorized by:   Blondell Reveal MD   Signed by:   Blondell Reveal MD on 11/12/2009   Method used:   Print then Give to Patient   RxID:   [id number]   Prevention & Chronic Care Immunizations   Influenza vaccine: Fluvax 3+  (05/03/2009)   Influenza vaccine deferral: Not available  (02/13/2009)   Influenza vaccine due: 03/13/2010    Tetanus booster: 02/20/2009: Td   Tetanus booster due: 02/21/2019    Pneumococcal vaccine: Pneumovax  (08/28/2009)   Pneumococcal vaccine due: 03/04/2015  Colorectal  Screening   Hemoccult:  negative x 3  (07/04/2008)   Hemoccult action/deferral: Deferred  (10/30/2009)   Hemoccult due: 07/2009    Colonoscopy: (1) Diverticula (2) Medium hemorrhoids Exam by Dr. Jordan Hawks. Hung  (09/27/2008)   Colonoscopy due: 10/2018  Other Screening   PSA: 0.58  (08/28/2009)   PSA action/deferral: Discussed-PSA requested  (08/28/2009)   Smoking status: quit  (11/12/2009)  Diabetes Mellitus   HgbA1C: 11.0  (10/16/2009)   HgbA1C action/deferral: Ordered  (02/13/2009)   Hemoglobin A1C due: 11/12/2007    Eye exam: Background diabetic retinopathy. Exam by Beulah Gandy. Ashley Royalty, MD  (08/29/2009)   Diabetic eye exam action/deferral: Ophthalmology referral  (02/20/2009)   Eye exam due: 02/26/2009    Foot exam: yes  (08/28/2009)   Foot exam action/deferral: Do today   High risk foot: Yes  (08/28/2009)   Foot care education: Done  (08/28/2009)   Foot exam due: 11/25/2009    Urine microalbumin/creatinine ratio: 8.5  (02/20/2009)   Urine microalbumin action/deferral: Ordered   Urine microalbumin/cr due: 12/06/2008  Lipids   Total Cholesterol: 132  (06/04/2009)   Lipid panel action/deferral: Lipid Panel ordered   LDL: 65  (06/04/2009)   LDL Direct: Not documented   HDL: 28  (06/04/2009)   Triglycerides: 193  (06/04/2009)   Lipid panel due: 03/15/2008    SGOT (AST): 25  (08/28/2009)   BMP action: Ordered   SGPT (ALT): 30  (08/28/2009)   Alkaline phosphatase: 141  (08/28/2009)   Total bilirubin: 0.6  (08/28/2009)  Self-Management Support :   Personal Goals (by the next clinic visit) :     Personal A1C goal: 7  (04/09/2009)     Personal blood pressure goal: 130/80  (04/09/2009)     Personal LDL goal: 100  (04/09/2009)    Patient will work on the following items until the next clinic visit to reach self-care goals:     Medications and monitoring: take my medicines every day, check my blood sugar, bring all of my medications to every visit, examine my feet every  day  (11/12/2009)     Eating: drink diet soda or water instead of juice or soda, eat more vegetables, use fresh or frozen vegetables, eat foods that are low in salt, eat baked foods instead of fried foods  (11/12/2009)     Activity: take a 30 minute walk every day  (11/12/2009)     Home glucose monitoring frequency: 3 times a day  (06/04/2009)    Diabetes self-management support: CBG self-monitoring log, Written self-care plan, Resources for patients handout  (11/12/2009)   Diabetes care plan printed   Last diabetes self-management training by diabetes educator: 09/25/2009   Last medical nutrition therapy: 06/28/2008    Lipid self-management support: Written self-care plan, Resources for patients handout  (11/12/2009)   Lipid self-care plan printed.      Resource handout printed.

## 2010-08-12 NOTE — Progress Notes (Signed)
Summary: refill/gg  Phone Note Refill Request  on April 28, 2010 3:32 PM  Refills Requested: Medication #1:  HYDROCODONE-ACETAMINOPHEN 5-325 MG TABS Take 1and 1/2 tablets by mouth every 6 hours as needed for pain   Last Refilled: 04/04/2010 THis is early, pt wants ASAP per Lupita Leash [name]'s  note   Method Requested: Fax to Local Pharmacy Initial call taken by: Merrie Roof RN,  April 28, 2010 3:32 PM  Follow-up for Phone Call        Please clarify when the pharmacy last filled this med. Follow-up by: Margarito Liner MD,  April 29, 2010 5:57 PM  Additional Follow-up for Phone Call Additional follow up Details #1::        Dr Meredith Pel last refill was 9/23 per [facility] Additional Follow-up by: Merrie Roof RN,  May 01, 2010 10:06 AM    Additional Follow-up for Phone Call Additional follow up Details #2::    Refill approved - nurse to complete. Follow-up by: Margarito Liner MD,  May 02, 2010 3:44 PM  Additional Follow-up for Phone Call Additional follow up Details #3:: Details for Additional Follow-up Action Taken: Rx faxed in Additional Follow-up by: Merrie Roof RN,  May 02, 2010 4:45 PM  Prescriptions: HYDROCODONE-ACETAMINOPHEN 5-325 MG TABS (HYDROCODONE-ACETAMINOPHEN) Take 1and 1/2 tablets by mouth every 6 hours as needed for pain  #180 x 0   Entered and Authorized by:   Margarito Liner MD   Signed by:   Margarito Liner MD on 05/02/2010   Method used:   Telephoned to ...       [facility] [id number] (retail)       [address] [place], [place]  [address]       Ph: [phone]       Fax: [phone]   RxID:   [id number]

## 2010-08-12 NOTE — Assessment & Plan Note (Signed)
Summary: Soc. Work   Social Work Evaluation Date  02/05/2010 Patient name [name]  Primary MD   : Margarito Liner MD Social Worker's name : Dorothe Pea MSW- LCSW  Home [phone]  Work phone: [phone]  Cell phone: .  Marland Kitchen     Alternate phone: . Marland Kitchen        Primary Reason for Referral:     Assist with Medicare D/Medicaid Eligibility/Medication Assist Comments Patient needs medication assist/insulin dependent.    $1108 is SSD income.  Will not be eligible for Medicare  eligibility in October.     Lump sum payment in 2011 of 13,000.  [name] says will not qualify for assist programs.  Action taken by Social Work: Soc. Work will call MAP or [facility] for options.  Patient has gone thru 13,000 lump so that is not available to him to use for medications.

## 2010-08-12 NOTE — Progress Notes (Signed)
Summary: [facility] needs insulin prescription/dmr  Phone Note Outgoing Call   Call placed by: Jamison Neighbor RD,CDE,  July 30, 2009 3:51 PM Summary of Call: patient called Mamie for sample of insulin: called Diane at [facility] reported that he  got 9 vials from [facility] insulin on 10/29, they had incorrect dosage of 40 units in am and 60 units in pm- so this is why he is short. they will give him humalog 75/25 samples until new norder arrives.   [facility] requests new prescription with correct dosages for as many refills as possible be faxed to them ASAP.         New/Updated Medications: NOVOLOG MIX 70/30 FLEXPEN 70-30 % SUSP (INSULIN ASPART PROT & ASPART) Inject 55 units subcutaneously before breakfast and 65 units before supper. NOVOLOG MIX 70/30 FLEXPEN 70-30 % SUSP (INSULIN ASPART PROT & ASPART) Inject 55 units subcutaneously before breakfast and 65 units before supper. Prescriptions: NOVOLOG MIX 70/30 FLEXPEN 70-30 % SUSP (INSULIN ASPART PROT & ASPART) Inject 55 units subcutaneously before breakfast and 65 units before supper.  #8 boxes x 1   Entered by:   Jamison Neighbor RD,CDE   Authorized by:   Margarito Liner MD   Signed by:   Jamison Neighbor RD,CDE on 07/30/2009   Method used:   Printed then faxed to ...       [facility] (retail)       [address] [place], [place]  [address]       Ph: [phone]       Fax: [phone]   RxID:   [id number]  above prescription signed in error, will forward to Dr.Lorisa Scheid for signature. Jamison Neighbor RD,CDE  July 30, 2009 4:30 PM

## 2010-08-12 NOTE — Letter (Signed)
Summary: Generic Letter  [facility]  [address]   [place], [place] [address]   Phone: [phone]  Fax: [phone]    10/02/2009  RE: [name] [address] [place], [place]  [address]  Dear Rosey Bath,   this letter is to confirm that patient attended [facility] visit [facility] at [facility]. If you have any questions, please feel free to call me @ [phone].  Sincerely,     Jamison Neighbor RD,CDE  Appended Document: Generic Letter letter faxed today with results= okay
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
## 2010-08-12 NOTE — Miscellaneous (Signed)
Summary: ED visit note.  [name] called me on the after hours pager [phone]) and reported that he has been having swelling of his legs (L>R) since being discharged from the hospital. He reported that as was told by [place], he is not taking his Lasix and denied any fever, chills or any other symptoms. Since there was concern for DVT given for assymmetric edema, I advised him to go to ER. ED physician called me to check on him. Patient had 2+ pedal edema in LLE and 1+ edema on right LE (He was discharged with a diagnosis of LLE cellulitis and had 1+ pedal edema on LLE and no edema on RLE at the time of discharge). His cellulitis is improving. His labs (CBC, I-stat) are stable from discharge and Vitals are with in normal limits. Doppler studies were done in the ED and  there was no DVT. Advised him to restart Lasix 40 mg two times a day starting today and to finish the full course of abx therapy as advised before. Patient voices understanding. He has an appointment in clinic on July 27th in [facility]. Recommended to call earlier if his symptoms won't improve. Patient reported that he refilled his Lasix yesterday and has a month worth. He also reported that he has 40 tablets of Hydrocodone at home.

## 2010-08-12 NOTE — Progress Notes (Signed)
Summary: pain medication refill request/dmr  Phone Note Call from Patient Call back at Home Phone [phone]   Caller: Patient Summary of Call: Late entry for 9 am today: patient left message saying he couldn't get through to [facility] and would I get a message to Dr. Meredith Pel that he would like Dr. Meredith Pel to send a refil of pain pills to [facility] on [facility] [address]. please phone him back if you find out today. Triage nurse is handlinthis for him- called him back to inform him of this [phone] Initial call taken by: Jamison Neighbor RD,CDE,  September 06, 2009 2:54 PM

## 2010-08-12 NOTE — Progress Notes (Signed)
Summary: refill/gg  Phone Note Refill Request  on November 03, 2006 4:12 PM  Refills Requested: Medication #1:  GLIPIZIDE 10 MG TABS Take 1 tablet by mouth two times a day  Medication #2:  VICODIN 5-500 MG TABS Take 1 tablet by mouth every 6 hours as needed. Given 40 tablets without refills   Last Refilled: 10/10/2006  Medication #3:  TRUETRACK TEST  STRP test two times a day. pt has appointment 5/21  Initial call taken by: Merrie Roof RN,  November 03, 2006 4:12 PM  Follow-up for Phone Call        Refill approved-nurse to complete Follow-up by: Margarito Liner MD,  November 03, 2006 4:51 PM  Additional Follow-up for Phone Call Additional follow up Details #1::        Rx faxed to pharmacy Additional Follow-up by: Merrie Roof RN,  November 03, 2006 5:14 PM  New/Updated Medications: VICODIN 5-500 MG TABS (HYDROCODONE-ACETAMINOPHEN) Take 1 tablet by mouth every 6 hours as needed for pain  New/Updated Medications: VICODIN 5-500 MG TABS (HYDROCODONE-ACETAMINOPHEN) Take 1 tablet by mouth every 6 hours as needed for pain  Prescriptions: VICODIN 5-500 MG TABS (HYDROCODONE-ACETAMINOPHEN) Take 1 tablet by mouth every 6 hours as needed for pain  #30 x 0   Entered and Authorized by:   Margarito Liner MD   Signed by:   Margarito Liner MD on 11/03/2006   Method used:   Telephoned to ...       [facility] [address]       [address]       [place], [place]  [address]       Ph: [phone]       Fax:    RxID:   [id number] BZJIRCVEL TEST  STRP (GLUCOSE BLOOD) test two times a day  #one month x 1   Entered and Authorized by:   Margarito Liner MD   Signed by:   Margarito Liner MD on 11/03/2006   Method used:   Telephoned to ...       [facility] [address]       [address]       [name], [place]  [address]       Ph: [phone]       Fax:    RxID:   [id number] GLIPIZIDE 10 MG TABS (GLIPIZIDE) Take 1 tablet by mouth  two times a day  #62 x 1   Entered and Authorized by:   Margarito Liner MD   Signed by:   Margarito Liner MD on 11/03/2006   Method used:   Telephoned to ...       [facility] [address]       [address]       [place], [place]  [address]       Ph: [phone]       Fax:    RxID:   [id number]

## 2010-08-12 NOTE — Assessment & Plan Note (Signed)
Summary: 27month f/u/est/vs   Vital Signs:  Patient profile:   60 year old male Height:      78 inches Weight:      335.0 pounds BMI:     38.85 Temp:     98.0 degrees F oral Pulse rate:   90 / minute BP sitting:   136 / 80  (right arm)  Vitals Entered By: Filomena Jungling NT II (October 16, 2009 8:55 AM) CC: NEED RX FOR SHOES, ? HEARING TEST Is Patient Diabetic? Yes Did you bring your meter with you today? Yes Pain Assessment Patient in pain? yes     Location: feet Intensity: 10 Type: aching Nutritional Status BMI of > 30 = obese CBG Result 318  Have you ever been in a relationship where you felt threatened, hurt or afraid?No   Does patient need assistance? Functional Status Self care Ambulation Normal   Primary Care Provider:  Margarito Liner MD  CC:  NEED RX FOR SHOES and ? HEARING TEST.  History of Present Illness: Patient presents for followup of his poorly controlled diabetes mellitus and other medical problems. He also has developed an ulcerr on the anterior sole of his left foot near the base of his second toe and on the sole of his right heel, for which he is being followed at the wound care clinic.  His blood sugars are still elevated in the 300 range despite the increase in his insulin regimen on October 12, 1999.  He reports that he is compliant with his medications.  He also reports some chronic decrease in his hearing bilaterally which seems more noticeable over the past few months.  Preventive Screening-Counseling & Management  Alcohol-Tobacco     Alcohol drinks/day: 0     Alcohol type: An occasional beer; no regular use     Smoking Status: quit     Smoking Cessation Counseling: yes     Packs/Day: 1/20     Year Started: 1970     Cans of tobacco/week: no  Caffeine-Diet-Exercise     Does Patient Exercise: yes     Type of exercise: WALKING     Times/week: 7  Current Medications (verified): 1)  Glipizide 5 Mg  Tabs (Glipizide) .... Take 2 Tablets By Mouth Each  Morning and 2 Tablets Each Afternoon. 2)  Hydrocodone-Acetaminophen 5-325 Mg Tabs (Hydrocodone-Acetaminophen) .... Take 1and 1/2 Tablets By Mouth Every 6 Hours As Needed For Pain 3)  Amitriptyline Hcl 75 Mg  Tabs (Amitriptyline Hcl) .... Take 1 Tablet By Mouth Once A Day 4)  Viagra 50 Mg Tabs (Sildenafil Citrate) .... Take One Tablet By Mouth 1 Hour Before Sexual Activity.  Do Not Take More Than 1 Per Day. 5)  Lipitor 20 Mg  Tabs (Atorvastatin Calcium) .... Take 1 Tablet By Mouth Once A Day 6)  Novolog Mix 70/30 Flexpen 70-30 % Susp (Insulin Aspart Prot & Aspart) .... Inject 50 Units Subcutaneously Three Times A Day Before Meals As Close To  10 Am, 3 Pm and 8 Pm As Possible 7)  Prilosec 20 Mg Cpdr (Omeprazole) .... Take 1 Tablet By Mouth Once A Day 8)  Truetrack Test  Strp (Glucose Blood) .... Use To Test Blood Sugar Before Meals and At Bedtime. 9)  Prodigy Twist Top Lancets 28g  Misc (Lancets) .... Use To Test Blood Sugars Before Meals and At Bedtime Each Day 10)  Prodigy Insulin Syringe 28g X 1/2" 1 Ml Misc (Insulin Syringe-Needle U-100) .... Use To Injection Insulin Twice Daily 11)  Furosemide 20 Mg Tabs (Furosemide) .... Take 1 Tablet By Mouth Once A Day 12)  Prodigy Short Pen Needles 31g X 8 Mm Misc (Insulin Pen Needle) .... Use As Directed For Insulin Injection Two Times A Day 13)  Prodigy Insulin Syringe 28g X 1/2" 1 Ml Misc (Insulin Syringe-Needle U-100) .... Use To Inject Insuln Twice A Day  Allergies (verified): 1)  ! Vancomycin  Social History: Smoking Status:  quit  Review of Systems ENT:  Complains of decreased hearing; occasional tinnitus both ears; no ear pain. CV:  Complains of swelling of feet; denies chest pain or discomfort, difficulty breathing at night, and difficulty breathing while lying down. Resp:  Occasional exertional shortness of breath. GI:  Denies abdominal pain, bloody stools, change in bowel habits, nausea, and vomiting. Endo:  Complains of excessive thirst;  denies polyuria.  Physical Exam  General:  alert, no distress Lungs:  normal respiratory effort, normal breath sounds, no crackles, and no wheezes.   Heart:  normal rate, regular rhythm, no murmur, no gallop, and no rub.   Extremities:  8 mm ulcer on the plantar surface of right heel; 6 X 8 mm ulcer base of left 2nd toe; both ulcers appear clean with no drainage or evidence of infection; 2+ bilateral lower extremity edema   Impression & Recommendations:  Problem # 1:  DIABETES MELLITUS, TYPE II, UNCONTROLLED (ICD-250.02) Patient's diabetes mellitus remains poorly controlled despite a change to t.i.d. 70/30 insulin and an increase in his dose. The plan is to increase to 55 units of NovoLog mix 70/30 insulin 3 times a day before meals.  I advised him to call Monday so that we can see how his blood sugars are doing on the increased dose.  We may need to consider a transition to Lantus and mealtime NovoLog if we do not gain ground by increasing his 70/30 mix regimen.  His updated medication list for this problem includes:    Glipizide 5 Mg Tabs (Glipizide) .Marland Kitchen... Take 2 tablets by mouth each morning and 2 tablets each afternoon.    Novolog Mix 70/30 Flexpen 70-30 % Susp (Insulin aspart prot & aspart) ..... Inject 55 units subcutaneously three times a day before meals as close to  10 am, 3 pm and 8 pm as possible  Orders: T-Hgb A1C (in-house) ([id number]) T- Capillary Blood Glucose (95284)  Labs Reviewed: Creat: 1.56 (08/28/2009)     Last Eye Exam: Background diabetic retinopathy. Exam by [facility] (02/27/2008) Reviewed HgBA1c results: 11.0 (10/16/2009)  10.8 (08/28/2009)  Problem # 2:  EDEMA (ICD-782.3) Patient's edema has increased, and the plan is to increase his furosemide to a dose of 40 mg daily.  His updated medication list for this problem includes:    Furosemide 20 Mg Tabs (Furosemide) .Marland Kitchen... Take 2 tablets by mouth once a day  Problem # 3:  DIABETIC FOOT ULCER  (ICD-250.80) Patient will followup as scheduled at the wound center for care of his bilateral diabetic foot ulcers.  His updated medication list for this problem includes:    Glipizide 5 Mg Tabs (Glipizide) .Marland Kitchen... Take 2 tablets by mouth each morning and 2 tablets each afternoon.    Novolog Mix 70/30 Flexpen 70-30 % Susp (Insulin aspart prot & aspart) ..... Inject 55 units subcutaneously three times a day before meals as close to  10 am, 3 pm and 8 pm as possible  Labs Reviewed: Creat: 1.56 (08/28/2009)     Last Eye Exam: Background diabetic retinopathy. Exam by [facility] (02/27/2008) Reviewed HgBA1c results: 11.0 (10/16/2009)  10.8 (08/28/2009)  Problem # 4:  HYPERLIPIDEMIA (ICD-272.2) Patient is at goal on current regimen.  His updated medication list for this problem includes:    Lipitor 20 Mg Tabs (Atorvastatin calcium) .Marland Kitchen... Take 1 tablet by mouth once a day  Labs Reviewed: SGOT: 25 (08/28/2009)   SGPT: 30 (08/28/2009)   HDL:28 (06/04/2009), 26 (06/28/2008)  LDL:65 (06/04/2009), 57 (06/28/2008)  Chol:132 (06/04/2009), 147 (06/28/2008)  Trig:193 (06/04/2009), 322 (06/28/2008)  Complete Medication List: 1)  Glipizide 5 Mg Tabs (Glipizide) .... Take 2 tablets by mouth each morning and 2 tablets each afternoon. 2)  Hydrocodone-acetaminophen 5-325 Mg Tabs (Hydrocodone-acetaminophen) .... Take 1and 1/2 tablets by mouth every 6 hours as needed for pain 3)  Amitriptyline Hcl 75 Mg Tabs (Amitriptyline hcl) .... Take 1 tablet by mouth once a day 4)  Viagra 50 Mg Tabs (Sildenafil citrate) .... Take one tablet by mouth 1 hour before sexual activity.  do not take more than 1 per day. 5)  Lipitor 20 Mg Tabs (Atorvastatin calcium) .... Take 1 tablet by mouth once a day 6)  Novolog Mix 70/30 Flexpen 70-30 % Susp (Insulin aspart prot & aspart) .... Inject 55 units subcutaneously three times a day before meals as close to  10 am, 3 pm and 8 pm as possible 7)  Prilosec 20 Mg Cpdr  (Omeprazole) .... Take 1 tablet by mouth once a day 8)  Truetrack Test Strp (Glucose blood) .... Use to test blood sugar before meals and at bedtime. 9)  Prodigy Twist Top Lancets 28g Misc (Lancets) .... Use to test blood sugars before meals and at bedtime each day 10)  Prodigy Insulin Syringe 28g X 1/2" 1 Ml Misc (Insulin syringe-needle u-100) .... Use to injection insulin twice daily 11)  Furosemide 20 Mg Tabs (Furosemide) .... Take 2 tablets by mouth once a day 12)  Prodigy Short Pen Needles 31g X 8 Mm Misc (Insulin pen needle) .... Use as directed for insulin injection two times a day 13)  Prodigy Insulin Syringe 28g X 1/2" 1 Ml Misc (Insulin syringe-needle u-100) .... Use to inject insuln twice a day  Other Orders: T-Basic Metabolic Panel [phone])  Patient Instructions: 1)  Please schedule a follow-up appointment in 2 weeks. 2)  Increase furosemide 20 mg to a dose of 2 tablets daily. 3)  Increase Novolog Mix 70/30 insulin to 55 units three times a day with meals. 4)  Please keep your appointment at the Wound Center.  Process Orders Check Orders Results:     [facility]: ABN not required for this insurance Tests Sent for requisitioning (October 16, 2009 7:32 PM):     10/16/2009: [facility] -- T-Basic Metabolic Panel [phone] (signed)     Prevention & Chronic Care Immunizations   Influenza vaccine: Fluvax 3+  (05/03/2009)   Influenza vaccine deferral: Not available  (02/13/2009)   Influenza vaccine due: 03/13/2010    Tetanus booster: 02/20/2009: Td   Tetanus booster due: 02/21/2019    Pneumococcal vaccine: Pneumovax  (08/28/2009)   Pneumococcal vaccine due: 03/04/2015  Colorectal Screening   Hemoccult: negative x 3  (07/04/2008)   Hemoccult due: 07/2009    Colonoscopy: (1) Diverticula (2) Medium hemorrhoids Exam by Dr. Jordan Hawks. Hung  (09/27/2008)   Colonoscopy due: 10/2018  Other Screening   PSA: 0.58  (08/28/2009)   PSA  action/deferral: Discussed-PSA requested  (08/28/2009)  Reports requested:  Smoking status: quit  (10/16/2009)  Diabetes Mellitus  HgbA1C: 11.0  (10/16/2009)   HgbA1C action/deferral: Ordered  (02/13/2009)   Hemoglobin A1C due: 11/12/2007    Eye exam: Background diabetic retinopathy. Exam by [facility]  (02/27/2008)   Last eye exam report requested.   Diabetic eye exam action/deferral: Ophthalmology referral  (02/20/2009)   Eye exam due: 02/26/2009    Foot exam: yes  (08/28/2009)   Foot exam action/deferral: Do today   High risk foot: Yes  (08/28/2009)   Foot care education: Done  (08/28/2009)   Foot exam due: 11/25/2009    Urine microalbumin/creatinine ratio: 8.5  (02/20/2009)   Urine microalbumin action/deferral: Ordered   Urine microalbumin/cr due: 12/06/2008    Diabetes flowsheet reviewed?: Yes   Progress toward A1C goal: Deteriorated   Diabetes comments: Eye doctor is Beulah Gandy. Ashley Royalty, MD  Lipids   Total Cholesterol: 132  (06/04/2009)   Lipid panel action/deferral: Lipid Panel ordered   LDL: 65  (06/04/2009)   LDL Direct: Not documented   HDL: 28  (06/04/2009)   Triglycerides: 193  (06/04/2009)   Lipid panel due: 03/15/2008    SGOT (AST): 25  (08/28/2009)   BMP action: Ordered   SGPT (ALT): 30  (08/28/2009)   Alkaline phosphatase: 141  (08/28/2009)   Total bilirubin: 0.6  (08/28/2009)    Lipid flowsheet reviewed?: Yes   Progress toward LDL goal: At goal  Self-Management Support :   Personal Goals (by the next clinic visit) :     Personal A1C goal: 7  (04/09/2009)     Personal blood pressure goal: 130/80  (04/09/2009)     Personal LDL goal: 100  (04/09/2009)    Patient will work on the following items until the next clinic visit to reach self-care goals:     Medications and monitoring: take my medicines every day, check my blood sugar  (10/16/2009)     Eating: eat more vegetables, eat foods that are low in salt, eat baked foods instead of  fried foods  (10/16/2009)     Activity: take a 30 minute walk every day  (10/16/2009)     Home glucose monitoring frequency: 3 times a day  (06/04/2009)    Diabetes self-management support: Written self-care plan  (10/16/2009)   Diabetes care plan printed   Last diabetes self-management training by diabetes educator: 09/25/2009   Last medical nutrition therapy: 06/28/2008    Lipid self-management support: Written self-care plan  (10/16/2009)   Lipid self-care plan printed.   Nursing Instructions: Request report of last diabetic eye exam - John D. Ashley Royalty, MD, [facility].   Laboratory Results   Blood Tests   Date/Time Received: October 16, 2009 9:14 AM  Date/Time Reported: Burke Keels  October 16, 2009 9:14 AM   HGBA1C: 11.0%   (Normal Range: Non-Diabetic - 3-6%   Control Diabetic - 6-8%) CBG Random:: 318mg /dL

## 2010-08-12 NOTE — Letter (Signed)
Summary: BLOOD GLUCOSE  BLOOD GLUCOSE   Imported By: Margie Billet 11/05/2009 12:03:56  _____________________________________________________________________  External Attachment:    Type:   Image     Comment:   External Document
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
## 2010-08-12 NOTE — Progress Notes (Signed)
Summary: refill/ hla  Phone Note Refill Request Message from:  Patient on August 13, 2009 2:04 PM  Refills Requested: Medication #1:  FUROSEMIDE 20 MG TABS Take 1 tablet by mouth once a day Initial call taken by: Marin Roberts RN,  August 13, 2009 2:04 PM  Follow-up for Phone Call        There should be refills available on this medication - please call and confirm. Follow-up by: Margarito Liner MD,  August 13, 2009 7:39 PM  Additional Follow-up for Phone Call Additional follow up Details #1::        has 2 refills remaining at pharm, refilled 2/2 Additional Follow-up by: Marin Roberts RN,  August 22, 2009 12:22 PM

## 2010-08-12 NOTE — Progress Notes (Signed)
Summary: pain medicine reqeust/dmr  Phone Note Call from Patient Call back at Home Phone [phone]   Caller: Patient Summary of Call: Patient called to request we call prescription for pain medicine to [facility] on [address] as soon as possible so he can pick it up on WEdnesday.  Discussed with triage nurse ( GG) this medicine was already refilled today. Initial call taken by: Jamison Neighbor RD,CDE,  April 28, 2010 3:20 PM  Follow-up for Phone Call        will put infor refill Follow-up by: Merrie Roof RN,  April 28, 2010 3:32 PM

## 2010-08-12 NOTE — Progress Notes (Signed)
Summary: refill/gg  Phone Note Refill Request  on August 01, 2009 4:00 PM  Refills Requested: Medication #1:  NOVOLOG MIX 70/30 FLEXPEN 70-30 % SUSP Inject 55 units subcutaneously before breakfast and 65 units before supper.  Method Requested: Fax to Local Pharmacy Initial call taken by: Merrie Roof RN,  August 01, 2009 4:07 PM  Follow-up for Phone Call        This was just done yesterday; please confirm that pharmacy received the faxed prescription. Follow-up by: Margarito Liner MD,  August 01, 2009 5:00 PM  Additional Follow-up for Phone Call Additional follow up Details #1::        Sorry, I now see the Rx and will refax it in Additional Follow-up by: Merrie Roof RN,  August 05, 2009 3:28 PM

## 2010-08-12 NOTE — Miscellaneous (Signed)
Summary: Orders Update- for cpap patient does not have insurance  Clinical Lists Changes  Problems: Changed problem from Question of  SLEEP APNEA, OBSTRUCTIVE (ICD-327.23) to SLEEP APNEA, OBSTRUCTIVE (ICD-327.23) Orders: Added new Referral order of Social Work Referral (Social ) - Signed

## 2010-08-12 NOTE — Progress Notes (Signed)
  Phone Note Outgoing Call   Call placed by: SW Summary of Call: Sabino Snipes at [facility] to find out about best way to handle getting CPAP machine based on Mr. Lampert financial information.   Follow-up for Phone Call        Explained unique financial situation to [facility] who said to send in referral and they will assess and set up financial plan with patient.  Dorothe Pea  March 03, 2010 12:39 PM

## 2010-08-12 NOTE — Letter (Signed)
Summary: [facility]  [facility]   Imported By: Margie Billet 10/23/2009 13:43:22  _____________________________________________________________________  External Attachment:    Type:   Image     Comment:   External Document  Appended Document: [facility]   Diabetic Eye Exam  Procedure date:  08/29/2009  Findings:      Background diabetic retinopathy. Exam by Beulah Gandy. Ashley Royalty, MD

## 2010-08-12 NOTE — Progress Notes (Signed)
Summary: diabetes support/dmr  Phone Note Outgoing Call   Call placed by: Jamison Neighbor RD,CDE,  January 01, 2010 10:50 AM Summary of Call: called patient to find out why his meter download did not hae any information for most of this month- June 2011. Left message for him to return call.  Follow-up for Phone Call        .spoke with patient:  clock in meter is correct, he has been testing regularly incuding this morning and recording values in a book. Thinks  new insulin started last week is helping some. will bring meter and log book by tomorrow on his way to another appointment. (Think new data since last visit was not downloaded) Follow-up by: Jamison Neighbor RD,CDE,  January 01, 2010 1:41 PM

## 2010-08-12 NOTE — Progress Notes (Signed)
  Phone Note Call from Patient Call back at Home Phone [phone]   Caller: Patient Summary of Call: Patient called to see if we got the paperwork for his motorized wheelchair/scooter that he saw on TV and requested. I told him that I did not see it in his chart, and that it could be in his doctor's chart. He says he needs this because all the walking he has to do is not good for his feet. Initial call taken by: Jamison Neighbor RD,CDE,  August 07, 2009 3:01 PM

## 2010-08-14 NOTE — Progress Notes (Signed)
Summary: NED RX FOR BOOT  Phone Note Call from Patient Call back at [phone]/  [phone]   Caller: [place] Call For: Margarito Liner MD Details for Reason: need rx for boot Summary of Call: DR.Xenia Nile WOULD YOU PLEASE WRITE A RX FOR MR. Phillis TO GET A CROW BOOT, I TALKED TO [facility]AND[facility] FROM [facility] AND THIS WOULD  BEFORE 3 TO 6 MONTHS TIME OF USE.. I HAVE A FAX NUMBER OF TO SEND TOTHIS TO [place], AND I ALSO HAVE A FORM FROM [facility] THAT WE CAN USE ALSO. Initial call taken by: Filomena Jungling NT II,  July 18, 2010 12:13 PM  Follow-up for Phone Call        Was this recommended by the wound center?  Follow-up by: Margarito Liner MD,  July 22, 2010 11:49 AM  Additional Follow-up for Phone Call Additional follow up Details #1::        Mr.Weingarten has been working with this group who makes his boots, and he will see the wound center on January 20,2012 and will get rx from them. Additional Follow-up by: Mamie Hague [place] II,  July 31, 2010 2:08 PM

## 2010-08-14 NOTE — Progress Notes (Signed)
Summary: refill/ hla  Phone Note Refill Request Message from:  Pharmacy on June 26, 2010 9:47 AM  Refills Requested: Medication #1:  NOVOLOG FLEXPEN 100 UNIT/ML SOLN Inject 40 units before breakfast   Dosage confirmed as above?Dosage Confirmed   Last Refilled: 10/19 Initial call taken by: Marin Roberts RN,  June 26, 2010 9:47 AM    Prescriptions: NOVOLOG FLEXPEN 100 UNIT/ML SOLN (INSULIN ASPART) Inject 40 units before breakfast, 50 units before lunch, and 50 units before dinner plus 1 unit for every 10 mg/dL above 213 mg/dL three times a day at meals  #3 months x 3   Entered and Authorized by:   Zoila Shutter MD   Signed by:   Zoila Shutter MD on 06/26/2010   Method used:   Faxed to ...       [facility] (retail)       [address]       [place], [place]  [address]       Ph: [phone]       Fax: [phone]   RxID:   [id number]

## 2010-08-14 NOTE — Progress Notes (Signed)
Summary: Refill/gh  Phone Note Refill Request Message from:  Patient on July 01, 2010 9:56 AM  Refills Requested: Medication #1:  HYDROCODONE-ACETAMINOPHEN 5-325 MG TABS Take 1and 1/2 tablets by mouth every 6 hours as needed for pain  Method Requested: Electronic Initial call taken by: Angelina Ok RN,  July 01, 2010 9:56 AM  Follow-up for Phone Call        Refill approved-nurse to complete. Follow-up by: Margarito Liner MD,  July 01, 2010 11:13 AM  Additional Follow-up for Phone Call Additional follow up Details #1::        Rx called to pharmacy - [facility]. Additional Follow-up by: Chinita Pester RN,  July 01, 2010 11:20 AM    Prescriptions: HYDROCODONE-ACETAMINOPHEN 5-325 MG TABS (HYDROCODONE-ACETAMINOPHEN) Take 1and 1/2 tablets by mouth every 6 hours as needed for pain  #180 x 1   Entered and Authorized by:   Margarito Liner MD   Signed by:   Margarito Liner MD on 07/01/2010   Method used:   Telephoned to ...       [facility] [id number] (retail)       [address] [place], [place]  [address]       Ph: [phone]       Fax: [phone]   RxID:   [id number]

## 2010-08-14 NOTE — Progress Notes (Signed)
Summary: medication  Phone Note Call from Patient   Caller: Patient Call For: Margarito Liner MD Reason for Call: Talk to Doctor Details for Reason: MEDICATION Summary of Call: Mr. Harjo called would like to know if his Viagra that he takes 50mg s could be increased , the dosage he is own does not seem to be working.iI will send this to his pcp. Initial call taken by: Filomena Jungling NT II,  June 17, 2010 4:36 PM  Follow-up for Phone Call        I will discuss with him during his office visit on Jul 16, 2010. Follow-up by: Margarito Liner MD,  July 03, 2010 9:49 AM

## 2010-08-14 NOTE — Progress Notes (Signed)
  Phone Note Outgoing Call   Summary of Call: Called [name] and he said that he called [employer] and they are sending him original Government social research officer award in [employer].    Also called [name] at DSS and left message to determine the status of his case.   Follow-up for Phone Call        [name] said that [name] applied for full Medicaid and not the MQB Medicaid and that if he wants to apply for MQB, he should come back to see her. Dorothe Pea  July 22, 2010 1:11 PM

## 2010-08-14 NOTE — Progress Notes (Signed)
Summary: [facility] needs Rx for new Levemir dose/dmr  Phone Note Outgoing Call   Call placed by: Jamison Neighbor RD,CDE,  July 16, 2010 12:31 PM Summary of Call: discussed patient with Dr. Meredith Pel. Per plan:Called Dr. Daune Perch office for most recent office note, consult to social work to clarify patient status regarding Medicare/Disability/Medicaid and for assistance in obtaining award letter from [employer] that is being requested from insulin assistance programs/[facility]. Emailed [facility] to clarify how much levemir they have on hand to give patient and how available other insulins are if regimen changed. (i.e. lantus and humalog)  Follow-up for Phone Call        MAP reports they have 3 boxes of levemir pens (4500 units or 37 days of 120 units) Evidently, Lilly and [employer]i are only slightly more lenient inthat they will accept the annual disability statement Office visit note received form Dr.Kerr's office- it reads as though patient has been on 120 units levemir for some time now. [facility] is requesting another prescription. please advise if you will send this to them or if I should contact Dr. Daune Perch office. thank you.  Follow-up by: Jamison Neighbor RD,CDE,  July 16, 2010 4:31 PM  Additional Follow-up for Phone Call Additional follow up Details #1::        Noted prescription faxed today by Dr. Meredith Pel. Additional Follow-up by: Jamison Neighbor RD,CDE,  July 18, 2010 12:30 PM

## 2010-08-14 NOTE — Progress Notes (Signed)
  Phone Note Outgoing Call   Summary of Call: Called Mr. Gage to let him know that Ms Mayford Knife said that she reviewed him for [facility] deductible program and not MQB.  I have encouraged Mr. Terriquez to apply for the MQB, purchase Medicare Part B, and discuss BCBS with his wife.  If he qualifies forMQB they will pick up his Part B premiums and he can get on to a full subsidy Medicare drug program.   He is still on his wife's BCBS and that may be a barrier to getting MQB-medicaid.  I told Mr. Williamsen he needs to figure out his insurance issues and make some decisions.    The plan is for him to go back down to DSS and meet with Ms. Turner for MQB eligibility.

## 2010-08-14 NOTE — Progress Notes (Signed)
Summary: out of levemir/dmr  Phone Note Outgoing Call   Call placed by: Jamison Neighbor RD,CDE,  July 15, 2010 4:24 PM Summary of Call: called [employer] about mail order papers, he cancelled them. He also tells me is is  out of levemir today and that he called health department and left messages but hasn;t heard form them. i will email Health department to confirm they are working on this- patient has appoitnment in am with Dr. Meredith Pel

## 2010-08-14 NOTE — Letter (Signed)
Summary: BLOOD GLUCOSE REPORT  BLOOD GLUCOSE REPORT   Imported By: Margie Billet 07/21/2010 10:56:42  _____________________________________________________________________  External Attachment:    Type:   Image     Comment:   External Document

## 2010-08-14 NOTE — Assessment & Plan Note (Signed)
Summary: Soc. Work  Referred by Dr. Meredith Pel.  It appears that Mr. Rufo is needing his original Soc. Security award statement for MAP as he is having difficulty getting insulins.  He is also trying to sign up for Medicare B versus Medicaid.   Discussed above issues w Mr. Preast and he met with Ms.  Turner at DSS and signed up for Medicare B.  His check is $1108 and so he will not qualify for full Medicaid but likely the program that pays for his Part B premiums.   Ms. Norris Cross phone number is [phone] at DSS to confirm status of all this.    Mr. Cerrone informs me that his rent is $725.  I asked him if he has ever thought about finding a less expensive apt.  He tells me that he has but doesn't have the funds to move right now.    The plan is to follow with MAP and DSS.  I see Jamison Neighbor has made a call to MAP and I will also consult with her.

## 2010-08-14 NOTE — Assessment & Plan Note (Signed)
Summary: f/u per dr Arlene Brickel/pcp-Maylie Ashton/hla   Vital Signs:  Patient profile:   60 year old male Height:      78 inches Weight:      361.1 pounds BMI:     41.88 Temp:     97.0 degrees F oral Pulse rate:   106 / minute BP sitting:   121 / 77  (right arm)  Vitals Entered By: Filomena Jungling NT II (July 16, 2010 10:47 AM) Is Patient Diabetic? Yes Did you bring your meter with you today? Yes Nutritional Status BMI of > 30 = obese CBG Result 289  Have you ever been in a relationship where you felt threatened, hurt or afraid?No   Does patient need assistance? Functional Status Self care Ambulation Normal   Primary Care Provider:  Margarito Liner MD   History of Present Illness: Patient returns for followup of his diabetes mellitus and other medical problems.  He has no acute complaints today.  He reports that he saw Dr. [facility] last week, and that there was no change in regimen.  He reports that the MAP program has Levemir, but he has not picked it up; he also says he has Novolog at home.   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  Preventive Screening-Counseling & Management  Alcohol-Tobacco     Smoking Status: quit  Current Medications (verified): 1)  Hydrocodone-Acetaminophen 5-325 Mg Tabs (Hydrocodone-Acetaminophen) .... Take 1and 1/2 Tablets By Mouth Every 6 Hours As Needed For Pain 2)  Amitriptyline Hcl 75 Mg  Tabs (Amitriptyline Hcl) .... Take 1 Tablet By Mouth Once A Day 3)  Viagra 50 Mg Tabs (Sildenafil Citrate) .... Take One Tablet By Mouth 1 Hour Before Sexual Activity.  Do Not Take More Than 1 Per Day. 4)  Lipitor 20 Mg  Tabs (Atorvastatin Calcium) .... Take 1 Tablet By Mouth Once A Day 5)  Prilosec 20 Mg Cpdr (Omeprazole) .... Take 1 Tablet By Mouth Once A Day 6)  Truetrack Test  Strp (Glucose Blood) .... Use To Test Blood Sugar Before Meals and At Bedtime. 7)  Prodigy  Twist Top Lancets 28g  Misc (Lancets) .... Use To Test Blood Sugars Before Meals and At Bedtime Each Day 8)  Bd Insulin Syringe Ultrafine 31g X 5/16" 1 Ml Misc (Insulin Syringe-Needle U-100) .... Use As Directed For Once Daily Insulin Injection 9)  Prodigy Short Pen Needles 31g X 8 Mm Misc (Insulin Pen Needle) .... Use As Directed For Insulin Injection Two Times A Day 10)  Walker  Misc (Misc. Devices) .... Use As Directed. 11)  Neurontin 100 Mg Caps (Gabapentin) .... Take 1 Tablet By Mouth Three Times A Day 12)  Aspir-Low 81 Mg Tbec (Aspirin) .... Take 1 Tablet By Mouth Once A Day 13)  Levemir 100 Unit/ml Soln (Insulin Detemir) .... Inject 120 Units Subcutaneously Once A Day Each Morning 14)  Novolog Flexpen 100 Unit/ml Soln (Insulin Aspart) .... Inject 40 Units Before Breakfast, 50 Units Before Lunch, and 50 Units Before Dinner Plus 1 Unit For Every 10 Mg/dl Above 960 Mg/dl Three Times A Day At Meals 15)  Lasix 20 Mg Tabs (Furosemide) .... Take 2 Tablets By Mouth Two Times A Day  Allergies (verified): 1)  ! Vancomycin  Review of Systems CV:  Denies chest pain or discomfort; reports stable mild pedal edema, stable exertional dyspnea. Resp:  Denies cough and wheezing.  GI:  Denies abdominal pain, nausea, and vomiting.  Physical Exam  General:  alert, no distress Lungs:  normal respiratory effort, normal breath sounds, no crackles, and no wheezes.   Heart:  normal rate, regular rhythm, no murmur, no gallop, and no rub.   Abdomen:  soft, non-tender, and normal bowel sounds.   Extremities:  1+ left pedal edema and 1+ right pedal edema.     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  Plan is to have patient seen by clinic social worker and diabetes educator, to clarify what resources are available.  For now, will continue current regimen as per Dr.  [facility], and check labs as below.    His updated medication list for this problem includes:    Aspir-low 81 Mg Tbec (Aspirin) .Marland Kitchen... Take 1 tablet by mouth once a day    Levemir 100 Unit/ml Soln (Insulin detemir) ..... Inject 120 units subcutaneously once a day each morning    Novolog Flexpen 100 Unit/ml Soln (Insulin aspart) ..... Inject 40 units before breakfast, 50 units before lunch, and 50 units before dinner plus 1 unit for every 10 mg/dl above 161 mg/dl three times a day at meals  Labs Reviewed: Creat: 1.58 (04/22/2010)     Last Eye Exam: Diabetic background retinopathy Cataracts Glaucoma suspect Hyperopia, astigmatism, presbyopia Exam by [name], DO (12/23/2009) Reviewed HgBA1c results: 8.9 (07/16/2010)  8.4 (04/22/2010)  Orders: T-CMP with Estimated GFR ([id number])  Problem # 2:  HYPERLIPIDEMIA (ICD-272.2) Patient is doing well on Lipitor.  His updated medication list for this problem includes:    Lipitor 20 Mg Tabs (Atorvastatin calcium) .Marland Kitchen... Take 1 tablet by mouth once a day  Labs Reviewed: SGOT: 29 (04/22/2010)   SGPT: 31 (04/22/2010)   HDL:32 (11/12/2009), 28 (06/04/2009)  LDL:61 (11/12/2009), 65 (06/04/2009)  Chol:138 (11/12/2009), 132 (06/04/2009)  Trig:226 (11/12/2009), 193 (06/04/2009)  Problem # 3:  RENAL INSUFFICIENCY, CHRONIC (ICD-585.9) As above, will check a metabolic panel today.  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  His updated medication list for this problem includes:    Aspir-low 81 Mg Tbec (Aspirin) .Marland Kitchen... Take 1 tablet by mouth once a day    Levemir 100 Unit/ml Soln (Insulin detemir) ..... Inject 120 units subcutaneously once a day each morning    Novolog Flexpen 100 Unit/ml Soln  (Insulin aspart) ..... Inject 40 units before breakfast, 50 units before lunch, and 50 units before dinner plus 1 unit for every 10 mg/dl above 811 mg/dl three times a day at meals  Complete Medication List: 1)  Hydrocodone-acetaminophen 5-325 Mg Tabs (Hydrocodone-acetaminophen) .... Take 1and 1/2 tablets by mouth every 6 hours as needed for pain 2)  Amitriptyline Hcl 75 Mg Tabs (Amitriptyline hcl) .... Take 1 tablet by mouth once a day 3)  Viagra 50 Mg Tabs (Sildenafil citrate) .... Take one tablet by mouth 1 hour before sexual activity.  do not take more than 1 per day. 4)  Lipitor 20 Mg Tabs (Atorvastatin calcium) .... Take 1 tablet by mouth once a day 5)  Prilosec 20 Mg Cpdr (Omeprazole) .... Take 1  tablet by mouth once a day 6)  Truetrack Test Strp (Glucose blood) .... Use to test blood sugar before meals and at bedtime. 7)  Prodigy Twist Top Lancets 28g Misc (Lancets) .... Use to test blood sugars before meals and at bedtime each day 8)  Bd Insulin Syringe Ultrafine 31g X 5/16" 1 Ml Misc (Insulin syringe-needle u-100) .... Use as directed for once daily insulin injection 9)  Prodigy Short Pen Needles 31g X 8 Mm Misc (Insulin pen needle) .... Use as directed for insulin injection two times a day 10)  Walker Misc (Misc. devices) .... Use as directed. 11)  Neurontin 100 Mg Caps (Gabapentin) .... Take 1 tablet by mouth three times a day 12)  Aspir-low 81 Mg Tbec (Aspirin) .... Take 1 tablet by mouth once a day 13)  Levemir 100 Unit/ml Soln (Insulin detemir) .... Inject 120 units subcutaneously once a day each morning 14)  Novolog Flexpen 100 Unit/ml Soln (Insulin aspart) .... Inject 40 units before breakfast, 50 units before lunch, and 50 units before dinner plus 1 unit for every 10 mg/dl above 875 mg/dl three times a day at meals 15)  Lasix 20 Mg Tabs (Furosemide) .... Take 2 tablets by mouth two times a day  Other Orders: T-Hgb A1C (in-house) ([id number]) T- Capillary Blood Glucose  ([id number])  Patient Instructions: 1)  Please schedule a follow-up appointment in 1 month.   Orders Added: 1)  T-Hgb A1C (in-house) [[id number]] 2)  T-CMP with Estimated GFR [[id number]] 3)  T- Capillary Blood Glucose [[id number]] 4)  Est. Patient Level III [66063]    Process Orders Check Orders Results:     [facility]: ABN not required for this insurance Tests Sent for requisitioning (July 22, 2010 11:47 AM):     07/16/2010: [facility] -- T-CMP with Estimated GFR [[id number]] (signed)     Prevention & Chronic Care Immunizations   Influenza vaccine: Fluvax Non-MCR  (04/22/2010)   Influenza vaccine deferral: Not available  (02/13/2009)   Influenza vaccine due: 03/13/2010    Tetanus booster: 02/20/2009: Td   Tetanus booster due: 02/21/2019    Pneumococcal vaccine: Pneumovax  (08/28/2009)   Pneumococcal vaccine due: 03/04/2015    H. zoster vaccine: Not documented  Colorectal Screening   Hemoccult: negative x 3  (07/04/2008)   Hemoccult action/deferral: Not indicated  (12/12/2009)   Hemoccult due: 07/2009    Colonoscopy: (1) Diverticula (2) Medium hemorrhoids Exam by Dr. Jordan Hawks. Hung  (09/27/2008)   Colonoscopy due: 10/2018  Other Screening   PSA: 0.58  (08/28/2009)   PSA action/deferral: Discussed-PSA requested  (08/28/2009)   Smoking status: quit  (07/16/2010)  Diabetes Mellitus   HgbA1C: 8.9  (07/16/2010)   HgbA1C action/deferral: Ordered  (02/13/2009)   Hemoglobin A1C due: 10/15/2010    Eye exam: Diabetic background retinopathy Cataracts Glaucoma suspect Hyperopia, astigmatism, presbyopia Exam by [name], DO  (12/23/2009)   Diabetic eye exam action/deferral: Ophthalmology referral  (02/20/2009)   Eye exam due: 12/24/2010    Foot exam: yes  (01/01/2010)   Foot exam action/deferral: Do today   High risk foot: Yes  (01/01/2010)   Foot care education: Done  (01/01/2010)   Foot exam due: 10/15/2010    Urine  microalbumin/creatinine ratio: 4.6  (11/12/2009)   Urine microalbumin action/deferral: Ordered   Urine microalbumin/cr due: 12/06/2008    Diabetes flowsheet reviewed?: Yes   Progress toward A1C goal: Deteriorated  Lipids   Total Cholesterol: 138  (11/12/2009)   Lipid panel action/deferral: Lipid  Panel ordered   LDL: 61  (11/12/2009)   LDL Direct: Not documented   HDL: 32  (11/12/2009)   Triglycerides: 226  (11/12/2009)   Lipid panel due: 03/15/2008    SGOT (AST): 29  (04/22/2010)   BMP action: Ordered   SGPT (ALT): 31  (04/22/2010)   Alkaline phosphatase: 158  (04/22/2010)   Total bilirubin: 0.4  (04/22/2010)    Lipid flowsheet reviewed?: Yes   Progress toward LDL goal: At goal  Self-Management Support :   Personal Goals (by the next clinic visit) :     Personal A1C goal: 7  (04/09/2009)     Personal blood pressure goal: 130/80  (04/09/2009)     Personal LDL goal: 100  (04/09/2009)    Patient will work on the following items until the next clinic visit to reach self-care goals:     Medications and monitoring: take my medicines every day, check my blood sugar, bring all of my medications to every visit  (07/16/2010)     Eating: drink diet soda or water instead of juice or soda, eat more vegetables, use fresh or frozen vegetables, eat foods that are low in salt, eat baked foods instead of fried foods, eat fruit for snacks and desserts  (07/16/2010)     Activity: take a 30 minute walk every day, take the stairs instead of the elevator  (07/16/2010)     Home glucose monitoring frequency: 3 times a day  (06/04/2009)    Diabetes self-management support: Written self-care plan  (07/16/2010)   Diabetes care plan printed   Last diabetes self-management training by diabetes educator: 03/10/2010   Last medical nutrition therapy: 06/28/2008    Lipid self-management support: Written self-care plan  (07/16/2010)   Lipid self-care plan printed.   Laboratory Results   Blood Tests     Date/Time Received: July 16, 2010 11:11 AM  Date/Time Reported: Burke Keels  July 16, 2010 11:11 AM   HGBA1C: 8.9%   (Normal Range: Non-Diabetic - 3-6%   Control Diabetic - 6-8%) CBG Random:: 289mg /dL  Comments: Patient had one piece of peppermint only Burke Keels  July 16, 2010 11:12 AM

## 2010-08-14 NOTE — Consult Note (Signed)
Summary: Endocrine - DR. JEFFREY KERR  DR. JEFFREY SCOTT   Imported By: Margie Billet 07/23/2010 11:41:08  _____________________________________________________________________  External Attachment:    Type:   Image     Comment:   External Document

## 2010-08-14 NOTE — Progress Notes (Signed)
Summary: refill Novolog Flexpen/dmr  Phone Note Refill Request   Refills Requested: Medication #1:  NOVOLOG FLEXPEN 100 UNIT/ML SOLN Inject 40 units before breakfast Recived fx from [facility] that Dr. [facility] will not refill [name]'s Novolog until he is seen in a noffice visit there- he is overdue. He was last seen in our office 04/22/2010. Patient has appointment scheduled next week with Dr. Meredith Pel. Discussed with [facility] attending who said we can refill.   Initial call taken by: [name] RD,CDE,  July 10, 2010 12:57 PM  Follow-up for Phone Call        Refill approved-nurse to complete- Will let Dr. Meredith Pel address Endocrinologist follow. Follow-up by: Julaine Fusi  DO,  July 10, 2010 2:13 PM    Prescriptions: NOVOLOG FLEXPEN 100 UNIT/ML SOLN (INSULIN ASPART) Inject 40 units before breakfast, 50 units before lunch, and 50 units before dinner plus 1 unit for every 10 mg/dL above 045 mg/dL three times a day at meals  #3 boxes x 1   Entered by:   [name] RD,CDE   Authorized by:   Julaine Fusi  DO   Signed by:   Julaine Fusi  DO on 07/10/2010   Method used:   Faxed to ...       [facility] (retail)       [address]       [place], [place]  [address]       Ph: [phone]       Fax: [phone]   RxID:   [phone] [id number]

## 2010-08-14 NOTE — Progress Notes (Signed)
Summary: patient requests service for home medication delivery/dmr  Phone Note Call from Patient Call back at Home Phone [phone]   Caller: Patient Summary of Call: Returned patient voicemail messag left yesterady about wanting [employer] paperwork completed to  have medicine sent to the house-  told him I would alert Front office to put papers in DR. Joines box ASAP and Dr. Meredith Pel that patient requested this service.  Initial call taken by: Jamison Neighbor RD,CDE,  June 12, 2010 9:42 AM     Appended Document: patient requests service for home medication delivery/dmr I have not yet received any paperwork.

## 2010-08-14 NOTE — Progress Notes (Signed)
Summary: referral from Wound center for high blood sugar/dmr  Phone Note Call from Patient Call back at Home Phone [phone]   Caller: [phone] Summary of Call: Patient called from wound clinic this morning- they want hinm to set up appointment to try to get better control of his diabetes- it's been in the 300s-400s and he doesn;t know what to do".  Initial call taken by: Jamison Neighbor RD,CDE,  July 18, 2010 10:00 AM  Follow-up for Phone Call        talked to patient: he admits that he still hasn't been in touch with [employer] for his levemir so does not have any and has been out for a few days. He said he would go to [facility] a bit later today. Follow-up by: Jamison Neighbor RD,CDE,  July 18, 2010 10:55 AM  Additional Follow-up for Phone Call Additional follow up Details #1::        Discussed with Dr. Meredith Pel who will send updated Rx for levemir to [facility]. Called Wound center & acknoweldge their referal. Patient agreed to meet with CDE same day as next visit with doctor. Additional Follow-up by: Jamison Neighbor RD,CDE,  July 18, 2010 12:27 PM    Additional Follow-up for Phone Call Additional follow up Details #2::    called [facility] to be sure they received faxed prescription- left message. Follow-up by: Jamison Neighbor RD,CDE,  July 18, 2010 12:29 PM

## 2010-08-15 ENCOUNTER — Encounter (HOSPITAL_BASED_OUTPATIENT_CLINIC_OR_DEPARTMENT_OTHER): Payer: Medicare Other | Attending: General Surgery

## 2010-08-15 DIAGNOSIS — L97509 Non-pressure chronic ulcer of other part of unspecified foot with unspecified severity: Secondary | ICD-10-CM | POA: Insufficient documentation

## 2010-08-15 DIAGNOSIS — E1169 Type 2 diabetes mellitus with other specified complication: Secondary | ICD-10-CM | POA: Insufficient documentation

## 2010-08-20 ENCOUNTER — Other Ambulatory Visit: Payer: Self-pay | Admitting: Internal Medicine

## 2010-08-20 ENCOUNTER — Ambulatory Visit (INDEPENDENT_AMBULATORY_CARE_PROVIDER_SITE_OTHER): Payer: Self-pay | Admitting: Dietician

## 2010-08-20 ENCOUNTER — Ambulatory Visit (INDEPENDENT_AMBULATORY_CARE_PROVIDER_SITE_OTHER): Payer: Self-pay | Admitting: Internal Medicine

## 2010-08-20 ENCOUNTER — Encounter: Payer: Self-pay | Admitting: Internal Medicine

## 2010-08-20 VITALS — BP 153/90 | HR 95 | Temp 97.1°F | Ht 78.0 in | Wt 371.9 lb

## 2010-08-20 DIAGNOSIS — E782 Mixed hyperlipidemia: Secondary | ICD-10-CM

## 2010-08-20 DIAGNOSIS — K219 Gastro-esophageal reflux disease without esophagitis: Secondary | ICD-10-CM

## 2010-08-20 DIAGNOSIS — E1169 Type 2 diabetes mellitus with other specified complication: Secondary | ICD-10-CM

## 2010-08-20 LAB — GLUCOSE, CAPILLARY: Glucose-Capillary: 284 mg/dL — ABNORMAL HIGH (ref 70–99)

## 2010-08-20 NOTE — Progress Notes (Signed)
  Subjective:    Patient ID: [name], male    DOB: 04/17/1951, 60 y.o.   MRN: [id number]  HPI Patient returns for follow up of his uncontrolled diabetes and other chronic medical problems.  He has no acute complaints.  His home blood glucose record shows persistent hyperglycemia, especially in the morning.  He acknowledges missing some doses of his insulin because he has trouble getting it through the county pharmacy.  He continues to follow up at the wound clinic for management of his diabetic foot ulcer, and has a weekly dressing change there on Fridays.   Review of Systems  Constitutional: Negative for fever and chills.  Respiratory: Positive for shortness of breath (Stable exertional dyspnea, none at rest.).   Cardiovascular: Negative for chest pain.  Gastrointestinal: Negative for abdominal pain.       Objective:   Physical Exam  Constitutional: No distress.  Pulmonary/Chest: Breath sounds normal. No respiratory distress. He has no wheezes. He has no rales.  Abdominal: Soft. Bowel sounds are normal. He exhibits no distension. There is no tenderness. There is no guarding.  Musculoskeletal: He exhibits edema (2+ bilateral leg edema).          Assessment & Plan:   DIABETES MELLITUS, TYPE II, UNCONTROLLED  Assessment & Plan: Lab Results  Component Value Date   HGBA1C 8.9 07/16/2010     Patient's diabetes is poorly controlled, and his inability to obtain a consistent supply of insulin appears to be a significant contributor to his lack of control.  Plan is for him to see Jamison Neighbor today, and try to determine what insulin forms may be available through the health department pharmacy.  He is scheduled to see Dr. Sharl Ma later this month, and I encouraged him to keep that appointment.   DIABETIC FOOT ULCER  Assessment & Plan: Patient will continue to follow up at the wound center for management.   HYPERLIPIDEMIA  Assessment & Plan: Lab Results  Component Value  Date   CHOL 138 11/12/2009   LDL 61 11/12/2009   HDL 32* 4/0/9811   TRIG 226* 11/12/2009     Patient is doing well on Lipitor; will continue at current dose.  Plan is to check a lipid panel within 2-3 months.

## 2010-08-20 NOTE — Assessment & Plan Note (Signed)
Lab Results  Component Value Date   CHOL 138 11/12/2009   LDL 61 11/12/2009   HDL 32* 11/13/6642   TRIG 226* 11/12/2009     Patient is doing well on Lipitor; will continue at current dose.  Plan is to check a lipid panel within 2-3 months.

## 2010-08-20 NOTE — Assessment & Plan Note (Addendum)
Lab Results  Component Value Date   HGBA1C 8.9 07/16/2010     Patient's diabetes is poorly controlled, and his inability to obtain a consistent supply of insulin appears to be a significant contributor to his lack of control.  Plan is for him to see Jamison Neighbor today, and try to determine what insulin forms may be available through the health department pharmacy.  He is scheduled to see Dr. Sharl Ma later this month, and I encouraged him to keep that appointment.

## 2010-08-20 NOTE — Consult Note (Signed)
Summary: [facility]   Imported By: Margie Billet 08/01/2010 10:36:51  _____________________________________________________________________  External Attachment:    Type:   Image     Comment:   External Document

## 2010-08-20 NOTE — Progress Notes (Signed)
Summary: lost pain med/ hla  Phone Note Call from Patient Call back at Home Phone [phone]   Caller: Patient Summary of Call: left pain meds and insulin in a bag in [place] when he went to a funeral,  Has insulin from health department- is requeting a Rx for pain meds- [facility] [address] told him they'd fill with a new RX. patient asked me to relay this messag to [place].  Message routed and verbally communicated this to [facility], trage nurse.   Follow-up for Phone Call        i spoke w/ pt, he states he has plenty of insulin but needs pain med replaced, i called the pharm, it was filled 1/18, pt states he left it in a friend's car and he can't get it back, he is informed he will need to file a police report and then provide the clinic w/ a copy, he states he does not want to do so and will just make do until next month, he also ask if he can take his wife's pain med and i informed him i could not give him permission or advise to do so in fact i would greatly advise him not to do so. Follow-up by: Marin Roberts RN,  August 07, 2010 11:12 AM  Additional Follow-up for Phone Call Additional follow up Details #1::        When did the pharmacy last fill the hydrocodone? Additional Follow-up by: Margarito Liner MD,  August 07, 2010 12:52 PM    Additional Follow-up for Phone Call Additional follow up Details #2::    jan 09,8119 the hydrocodone was filled at [facility]  Follow-up by: Marin Roberts RN,  August 11, 2010 9:51 AM

## 2010-08-20 NOTE — Progress Notes (Signed)
Diabetes Self-Management Training (DSMT)  Follow-Up 4 Visit  08/20/2010 [name], identified by name and date of birth, is a 60 y.o. male with Type 2 Diabetes. Year of diabetes diagnosis: 2007 Other persons present: none  ASSESSMENT Patient concerns are Medication.  There were no vitals taken for this visit. There is no height or weight on file to calculate BMI. No results found for this basename: [facility]   Lab Results  Component Value Date   HGBA1C 8.9 07/16/2010   Medication Nutrition Monitor: [employer] reviewed.  DIABETES BUNDLE: A1C in past 6 months? Yes.  Less than 7%? No LDL in past year? Yes.  Less than 100 mg/dL? Yes Microalbumin ratio in past year? No. Patient taking ACE or ARB? Yes. Blood pressure less than 130/80? Yes. Foot exam in last year? Yes. Eye exam in past year? Yes. Tobacco use? No. Pneumovax? Yes Flu vaccine? Yes Asprin? Yes    Support systems: friends lives alone family  Special needs: Simplified materials, Verbal instruction  Prior DM Education: Yes   Medications See Medications list.  Has adequate knowledge  Patients belief/attitude about diabetes: Diabetes can be controlled.  Self foot exams daily: Yes  Diabetes Complications: Neuropathy     Hyperglycemia: Yes Weekly Hypoglycemia: No   Meal Planning Some knowledge and No interest in improving   Assessment comments: Asked by Dr. Meredith Pel to assist with patient access to insulin and coordinate care between Endocrinologist, our office, patient, and Medication Assistance program.   Contacted Medication Assistance program. They cannot obtain patient's current type of insulin from [facility] without the original social security papers which are on order, but we do not know when he will get them. Asked about possibility of ordering alternative insulin for patient if endocrinologist approves such as Lantus, Apidra or Humalog. They can order Lantus and Humalog  without this letter.   Spoke with Dr. Sharl Ma who has samples of insulin that patient is currently taking, he also okayed substituting Humalog for Novolog and Lantus for Levemir if needed. He has an appointment with [name] this month.     INDIVIDUAL DIABETES EDUCATION PLAN:  Medical Nutrition Therapy _______________________________________________________________________  Intervention TOPICS COVERED TODAY:  Medication  assisted patient and physicain with coordiantion of care to obtain consistent supply of medication  PATIENTS GOALS/PLAN (copy and paste in patient instructions so patient receives a copy): 1.  Learning Objective:       Know how to obtain insulin to assist with blood sugar control 2.  Behavioral Objective:         Medications: To improve blood glucose levels, I will take my medication as prescribed Most of the time 75%  Personalized Follow-Up Plan for Ongoing Self Management Support:  Doctor's Office, friends and CDE visits ______________________________________________________________________   Outcomes Expected outcomes: Demonstrated interest in learning.Expect positive changes in lifestyle.  Self-care Barriers: Low literacy, Lack of transportation, Lack of material resources, Scientist, research (medical) provided: 1-800 Medicare number nad phone number of [employer] Program respresentative.  Patient to contact team via Phone if problems or questions.  Time in: 1100     Time out: 1130  Future DSMT - PRN   Farrell,Patrick Boehne         Called patient and gave him the above information about how to obtain a supply of insulin by getting samples from Dr. Daune Perch office (patient reports some transportation problems getting to his office) and also urgency of getting to [facility] to have Lantus and Humalog  ordered so he will not run out of insulin.  Will  request prescriptions to be sent to [facility] for Lantus and Humalog as appropriate.

## 2010-08-20 NOTE — Assessment & Plan Note (Signed)
Patient will continue to follow up at the wound center for management.

## 2010-08-20 NOTE — Patient Instructions (Signed)
Follow up with Dr. Sharl Ma as scheduled. Please take insulin as prescribed.

## 2010-08-22 ENCOUNTER — Other Ambulatory Visit: Payer: Self-pay | Admitting: Internal Medicine

## 2010-08-22 MED ORDER — INSULIN LISPRO 100 UNIT/ML ~~LOC~~ SOLN: SUBCUTANEOUS | Status: DC

## 2010-08-22 MED ORDER — INSULIN GLARGINE 100 UNIT/ML ~~LOC~~ SOLN: 120.0000 [IU] | Freq: Every day | SUBCUTANEOUS | Status: DC

## 2010-08-22 NOTE — Assessment & Plan Note (Signed)
Symptoms are controlled on current regimen.

## 2010-08-22 NOTE — Progress Notes (Signed)
Insulin regimen changed due to availability at [facility]; nurse to phone in prescriptions.

## 2010-08-22 NOTE — Progress Notes (Signed)
[facility] per dr Meredith Pel request, spoke w/ Emelda Fear

## 2010-08-28 ENCOUNTER — Other Ambulatory Visit: Payer: Self-pay | Admitting: Licensed Clinical Social Worker

## 2010-08-28 ENCOUNTER — Telehealth: Payer: Self-pay | Admitting: Licensed Clinical Social Worker

## 2010-08-28 NOTE — Telephone Encounter (Signed)
[name] t. Opened this to do a telephone note this is not a refill

## 2010-08-28 NOTE — Telephone Encounter (Signed)
I called [name] to reinforce need for copy of his original award letter.  I explained what that was and he was agreeable to calling [employer] once again or going down there to get copy of original award letter.  I had previously spoken with [name] at [facility] who said that patients have been able to go down to [employer] and request that document and sometimes they are able to give them a copy right there and then.   I will continue to check with [name] about this so he can have access to his insulin thru [facility].   He did say that he will make a visit to [employer] local office to obtain if he has to.

## 2010-08-28 NOTE — Telephone Encounter (Signed)
Called [name] to reinforce that he needs a copy of his original award letter from [employer] so he can obtain his insulin from [facility].  I told him that [facility] from [facility] said other patients have been able to go down to the local office to request this information and obtain right then and there or they will mail a copy.  [name] expressed understanding and said he will call [employer] today and if that does not work he will go down there tomorrow and request copy of original award letter and not just a statement.

## 2010-08-28 NOTE — Telephone Encounter (Signed)
Called [name] to make sure he understood that he needs a copy of his original award letter from [employer] to get insulin through the [facility] program.  I've instructed him once again to either call [employer] or preferably go down there to request a copy of this document.   I let him know that Cathy at [facility] told me that several patients have visited the local office and requested the document and either they can print him a copy right there and then or mail him one. [name] appeared to understand what I was instructing him to do and he agreed to call [employer] today and if that does not work go down to the local office to request the proper document.

## 2010-08-28 NOTE — Telephone Encounter (Signed)
This was a telephone encounter and not a refill.  I am adding this addendum to reflect that this was a telephone encounter and not a refill encounter.

## 2010-08-30 ENCOUNTER — Other Ambulatory Visit: Payer: Self-pay | Admitting: Internal Medicine

## 2010-09-01 ENCOUNTER — Telehealth: Payer: Self-pay | Admitting: Dietician

## 2010-09-01 NOTE — Telephone Encounter (Signed)
Using out of date test strips because he doest have the 6$ to purchase more from health department. Discussed using control solution and trying to purchase new ones ASAP.  Patient also requests message be given to Dr. Meredith Pel to request a prescription for pain meds that he [name] cane pick up alter this week.

## 2010-09-02 NOTE — Telephone Encounter (Signed)
Called to [facility]. 

## 2010-09-02 NOTE — Telephone Encounter (Signed)
Refill approved - nurse to complete. 

## 2010-09-09 ENCOUNTER — Telehealth: Payer: Self-pay | Admitting: Dietician

## 2010-09-09 NOTE — Telephone Encounter (Signed)
Patient called to say that he could not get test strips with his yellow card for 6 dollars I offered him 10 strips that he could pick up from our office. He said he would use the  expired strips he has ( that control ran okay on ) until he can get money to purchase strips.   He asked me to relay this message to social work to see if she can help. Wonder if patient is eli\gible to reapply for orange card?

## 2010-09-11 ENCOUNTER — Telehealth: Payer: Self-pay | Admitting: Licensed Clinical Social Worker

## 2010-09-11 NOTE — Telephone Encounter (Signed)
This was a check-in call with [name] based on a message from [name] that he was having difficulties affording his strips. I've told [name] that we seem to be doing the same things over and over again with him and not making any progress.  He confirms that he continues to be carried on his wife's insurance and yet still seeks services from programs such as [facility] and also financial assistance from [facility].  I've asked him to attempt to utilize his BCBS and see what kind of coverage they offer on his meds including insulin and  Diabetic supplies.  He is still thinking about getting off of his wife's insurance but is noncommital about that.  Otherwise he tells me he can obtain his strips for $22 at the Health Department.  If he has private insurance, he needs to be utilizing that insurance and/or purchase his Medicare B and D and then possibly get on low subsidy. The BCBS is definitely a barrier to getting onto any Medicaid or subsidy programs.

## 2010-09-12 ENCOUNTER — Encounter (HOSPITAL_BASED_OUTPATIENT_CLINIC_OR_DEPARTMENT_OTHER): Payer: Medicare Other | Attending: General Surgery

## 2010-09-12 DIAGNOSIS — E1169 Type 2 diabetes mellitus with other specified complication: Secondary | ICD-10-CM | POA: Insufficient documentation

## 2010-09-12 DIAGNOSIS — L97509 Non-pressure chronic ulcer of other part of unspecified foot with unspecified severity: Secondary | ICD-10-CM | POA: Insufficient documentation

## 2010-09-19 ENCOUNTER — Telehealth: Payer: Self-pay | Admitting: *Deleted

## 2010-09-19 NOTE — Telephone Encounter (Signed)
Call for Prior Authorization on Meloxicam 15 mg tablets.  Approved 09/19/2010 thru 09/18/2011.

## 2010-09-23 ENCOUNTER — Encounter: Payer: Self-pay | Admitting: Internal Medicine

## 2010-09-23 ENCOUNTER — Ambulatory Visit (INDEPENDENT_AMBULATORY_CARE_PROVIDER_SITE_OTHER): Payer: Self-pay | Admitting: Internal Medicine

## 2010-09-23 VITALS — BP 119/75 | HR 89 | Temp 97.5°F | Ht 78.0 in | Wt 370.0 lb

## 2010-09-23 DIAGNOSIS — N189 Chronic kidney disease, unspecified: Secondary | ICD-10-CM

## 2010-09-23 DIAGNOSIS — K069 Disorder of gingiva and edentulous alveolar ridge, unspecified: Secondary | ICD-10-CM

## 2010-09-23 DIAGNOSIS — E11319 Type 2 diabetes mellitus with unspecified diabetic retinopathy without macular edema: Secondary | ICD-10-CM

## 2010-09-23 DIAGNOSIS — K056 Periodontal disease, unspecified: Secondary | ICD-10-CM

## 2010-09-23 DIAGNOSIS — G4733 Obstructive sleep apnea (adult) (pediatric): Secondary | ICD-10-CM

## 2010-09-23 DIAGNOSIS — E782 Mixed hyperlipidemia: Secondary | ICD-10-CM

## 2010-09-23 LAB — CONVERTED CEMR LAB
Alkaline Phosphatase: 146 units/L — ABNORMAL HIGH (ref 39–117)
BUN: 13 mg/dL (ref 6–23)
CO2: 27 meq/L (ref 19–32)
Cholesterol: 118 mg/dL (ref 0–200)
Creatinine, Ser: 1.51 mg/dL — ABNORMAL HIGH (ref 0.40–1.50)
Glucose, Bld: 61 mg/dL — ABNORMAL LOW (ref 70–99)
HDL: 29 mg/dL — ABNORMAL LOW (ref >39)
LDL Cholesterol: 39 mg/dL (ref 0–99)
Total Bilirubin: 0.5 mg/dL (ref 0.3–1.2)
Total Protein: 7.4 g/dL (ref 6.0–8.3)
Triglycerides: 248 mg/dL — ABNORMAL HIGH (ref <150)
VLDL: 50 mg/dL — ABNORMAL HIGH (ref 0–40)

## 2010-09-23 LAB — LIPID PANEL
Cholesterol: 118 mg/dL (ref 0–200)
HDL: 29 mg/dL — ABNORMAL LOW (ref >39)
Triglycerides: 248 mg/dL — ABNORMAL HIGH (ref <150)

## 2010-09-23 LAB — COMPREHENSIVE METABOLIC PANEL
AST: 18 U/L (ref 0–37)
Albumin: 4.2 g/dL (ref 3.5–5.2)
Alkaline Phosphatase: 146 U/L — ABNORMAL HIGH (ref 39–117)
BUN: 13 mg/dL (ref 6–23)
CO2: 27 mEq/L (ref 19–32)
Chloride: 100 mEq/L (ref 96–112)
Glucose, Bld: 61 mg/dL — ABNORMAL LOW (ref 70–99)
Total Protein: 7.4 g/dL (ref 6.0–8.3)

## 2010-09-23 LAB — GLUCOSE, CAPILLARY: Glucose-Capillary: 107 mg/dL — ABNORMAL HIGH (ref 70–99)

## 2010-09-23 MED ORDER — GELCLAIR MT GEL: OROMUCOSAL | Status: DC

## 2010-09-23 NOTE — Progress Notes (Signed)
  Subjective:    Patient ID: [name], male    DOB: May 28, 1951, 60 y.o.   MRN: [id number]  HPI Patient returns for follow up of his diabetes mellitus, hypertension, hyperlipidemia, and other chronic medical problems. His main complaint today is pain which started earlier this week; the pain is located in the areas where he is edentulous and currently wears dentures. He denies changing his denture paste or eating any new or unusual foods. He reports that his gums hurt, but he denies any gum bleeding; he also denies toothache. Patient reports that he is seen weekly at the [facility] and that his foot is doing well. His blood sugars have improved since he has been getting his insulin on a more regular basis from the county pharmacy.  Review of Systems  Constitutional: Negative for fever, chills and appetite change.  Respiratory: Positive for shortness of breath (With exertion.). Negative for cough.   Cardiovascular: Negative for chest pain.  Gastrointestinal: Negative for nausea, vomiting and abdominal pain.       Objective:   Physical Exam  Constitutional: No distress.  HENT:  Mouth/Throat: He has dentures. Oral lesions (Patient's gums appear irritated with some areas of erosion in places that appear to correspond with contact with his dentures.) present. No oropharyngeal exudate, posterior oropharyngeal edema or posterior oropharyngeal erythema.  Cardiovascular: Normal rate, regular rhythm and normal heart sounds.  Exam reveals no gallop.   No murmur heard.      1+ bilateral pitting pretibial edema  Pulmonary/Chest: Effort normal and breath sounds normal. He has no wheezes. He has no rales.          Assessment & Plan:

## 2010-09-23 NOTE — Assessment & Plan Note (Signed)
Patient's gums appear irritated with some erosions in the area of where his dentures make contact.  I advised him to use Gelclair for symptomatic relief and protection of his gums, to eat soft foods, and to avoid wearing his dentures until his gums improve. I also advised him that he should see his dentist to make sure that his dentures are fitting properly.

## 2010-09-23 NOTE — Assessment & Plan Note (Signed)
Patient is doing well on Lipitor with no apparent side effects. The plan is to check a lipid panel; patient reports that he is fasting.

## 2010-09-23 NOTE — Assessment & Plan Note (Signed)
Hemoglobin A1C  Date Value Range Status  07/16/2010 8.9   Final    Assessment: Diabetes control: Control is improving based upon patient's recent home CBG measurements; this is likely because he has a more reliable source of insulin. Progress toward goals: improved Barriers to meeting goals: lack of insurance and financial need  Plan: Diabetes treatment: continue current medications Refer to: Patient will see his endocrinologist Dr. Sharl Ma tomorrow. Instruction/counseling given: reminded to get eye exam, reminded to bring medications to each visit and discussed foot care

## 2010-09-23 NOTE — Patient Instructions (Signed)
Use Gelclair as prescribed for sore gums: mix contents of one packet with small amount of water, then rinse mouth for 1 minute and spit out; use three times a day.   Do not wear dentures until gums are healed; return if gums have not healed within 1 week. Eat soft foods. Please make an appointment to see your dentist to assess your dentures.

## 2010-09-23 NOTE — Assessment & Plan Note (Signed)
Plan is to check a metabolic panel.

## 2010-09-23 NOTE — Assessment & Plan Note (Signed)
I advised patient to follow up with his ophthalmologist.

## 2010-09-23 NOTE — Assessment & Plan Note (Signed)
Patient reports that he is using his CPAP regularly; plan is to continue CPAP.

## 2010-09-24 ENCOUNTER — Ambulatory Visit: Payer: Self-pay | Admitting: Internal Medicine

## 2010-09-25 LAB — GLUCOSE, CAPILLARY: Glucose-Capillary: 63 mg/dL — ABNORMAL LOW (ref 70–99)

## 2010-09-26 LAB — GLUCOSE, CAPILLARY: Glucose-Capillary: 60 mg/dL — ABNORMAL LOW (ref 70–99)

## 2010-09-27 LAB — BASIC METABOLIC PANEL
BUN: 11 mg/dL (ref 6–23)
BUN: 16 mg/dL (ref 6–23)
Chloride: 101 mEq/L (ref 96–112)
Chloride: 96 mEq/L (ref 96–112)
Chloride: 99 mEq/L (ref 96–112)
GFR calc non Af Amer: 57 mL/min — ABNORMAL LOW (ref >60)
GFR calc non Af Amer: 58 mL/min — ABNORMAL LOW (ref >60)
Glucose, Bld: 252 mg/dL — ABNORMAL HIGH (ref 70–99)
Potassium: 3.5 mEq/L (ref 3.5–5.1)
Potassium: 3.7 mEq/L (ref 3.5–5.1)
Potassium: 4.1 mEq/L (ref 3.5–5.1)
Sodium: 130 mEq/L — ABNORMAL LOW (ref 135–145)
Sodium: 134 mEq/L — ABNORMAL LOW (ref 135–145)
Sodium: 137 mEq/L (ref 135–145)

## 2010-09-27 LAB — POCT I-STAT, CHEM 8
Calcium, Ion: 1.17 mmol/L (ref 1.12–1.32)
Chloride: 107 mEq/L (ref 96–112)
Glucose, Bld: 159 mg/dL — ABNORMAL HIGH (ref 70–99)
HCT: 38 % — ABNORMAL LOW (ref 39.0–52.0)
Hemoglobin: 12.9 g/dL — ABNORMAL LOW (ref 13.0–17.0)
TCO2: 23 mmol/L (ref 0–100)

## 2010-09-27 LAB — CBC
HCT: 32.6 % — ABNORMAL LOW (ref 39.0–52.0)
HCT: 32.9 % — ABNORMAL LOW (ref 39.0–52.0)
HCT: 33.8 % — ABNORMAL LOW (ref 39.0–52.0)
HCT: 34.5 % — ABNORMAL LOW (ref 39.0–52.0)
HCT: 34.6 % — ABNORMAL LOW (ref 39.0–52.0)
HCT: 38 % — ABNORMAL LOW (ref 39.0–52.0)
Hemoglobin: 10.7 g/dL — ABNORMAL LOW (ref 13.0–17.0)
Hemoglobin: 11.3 g/dL — ABNORMAL LOW (ref 13.0–17.0)
Hemoglobin: 11.5 g/dL — ABNORMAL LOW (ref 13.0–17.0)
Hemoglobin: 11.6 g/dL — ABNORMAL LOW (ref 13.0–17.0)
MCH: 29.6 pg (ref 26.0–34.0)
MCH: 30.3 pg (ref 26.0–34.0)
MCHC: 33.6 g/dL (ref 30.0–36.0)
MCHC: 33.6 g/dL (ref 30.0–36.0)
MCV: 90.2 fL (ref 78.0–100.0)
MCV: 90.2 fL (ref 78.0–100.0)
MCV: 90.9 fL (ref 78.0–100.0)
Platelets: 144 10*3/uL — ABNORMAL LOW (ref 150–400)
RBC: 3.65 MIL/uL — ABNORMAL LOW (ref 4.22–5.81)
RDW: 13.5 % (ref 11.5–15.5)
RDW: 13.9 % (ref 11.5–15.5)
RDW: 14 % (ref 11.5–15.5)
RDW: 14.1 % (ref 11.5–15.5)
RDW: 14.1 % (ref 11.5–15.5)
RDW: 14.1 % (ref 11.5–15.5)
WBC: 13.9 10*3/uL — ABNORMAL HIGH (ref 4.0–10.5)
WBC: 6.7 10*3/uL (ref 4.0–10.5)
WBC: 7.2 10*3/uL (ref 4.0–10.5)
WBC: 7.9 10*3/uL (ref 4.0–10.5)
WBC: 9.1 10*3/uL (ref 4.0–10.5)

## 2010-09-27 LAB — COMPREHENSIVE METABOLIC PANEL
ALT: 41 U/L (ref 0–53)
Albumin: 3.4 g/dL — ABNORMAL LOW (ref 3.5–5.2)
Alkaline Phosphatase: 144 U/L — ABNORMAL HIGH (ref 39–117)
GFR calc Af Amer: 32 mL/min — ABNORMAL LOW (ref >60)
Potassium: 4.3 mEq/L (ref 3.5–5.1)
Sodium: 127 mEq/L — ABNORMAL LOW (ref 135–145)
Total Protein: 7.6 g/dL (ref 6.0–8.3)

## 2010-09-27 LAB — DIFFERENTIAL
Basophils Absolute: 0 10*3/uL (ref 0.0–0.1)
Basophils Relative: 0 % (ref 0–1)
Basophils Relative: 0 % (ref 0–1)
Eosinophils Absolute: 0 10*3/uL (ref 0.0–0.7)
Eosinophils Absolute: 0.2 10*3/uL (ref 0.0–0.7)
Eosinophils Relative: 3 % (ref 0–5)
Lymphs Abs: 1 10*3/uL (ref 0.7–4.0)
Monocytes Absolute: 0.7 10*3/uL (ref 0.1–1.0)
Monocytes Absolute: 1 10*3/uL (ref 0.1–1.0)
Monocytes Relative: 7 % (ref 3–12)
Monocytes Relative: 8 % (ref 3–12)
Neutro Abs: 5.2 10*3/uL (ref 1.7–7.7)

## 2010-09-27 LAB — GLUCOSE, CAPILLARY
Glucose-Capillary: 125 mg/dL — ABNORMAL HIGH (ref 70–99)
Glucose-Capillary: 176 mg/dL — ABNORMAL HIGH (ref 70–99)
Glucose-Capillary: 206 mg/dL — ABNORMAL HIGH (ref 70–99)
Glucose-Capillary: 240 mg/dL — ABNORMAL HIGH (ref 70–99)
Glucose-Capillary: 251 mg/dL — ABNORMAL HIGH (ref 70–99)
Glucose-Capillary: 266 mg/dL — ABNORMAL HIGH (ref 70–99)
Glucose-Capillary: 272 mg/dL — ABNORMAL HIGH (ref 70–99)
Glucose-Capillary: 278 mg/dL — ABNORMAL HIGH (ref 70–99)
Glucose-Capillary: 285 mg/dL — ABNORMAL HIGH (ref 70–99)
Glucose-Capillary: 310 mg/dL — ABNORMAL HIGH (ref 70–99)
Glucose-Capillary: 319 mg/dL — ABNORMAL HIGH (ref 70–99)
Glucose-Capillary: 320 mg/dL — ABNORMAL HIGH (ref 70–99)
Glucose-Capillary: 420 mg/dL — ABNORMAL HIGH (ref 70–99)

## 2010-09-27 LAB — MRSA PCR SCREENING: MRSA by PCR: NEGATIVE

## 2010-09-27 LAB — GLUCOSE, RANDOM: Glucose, Bld: 354 mg/dL — ABNORMAL HIGH (ref 70–99)

## 2010-09-27 LAB — URINALYSIS, ROUTINE W REFLEX MICROSCOPIC
Ketones, ur: 15 mg/dL — AB
Leukocytes, UA: NEGATIVE
Nitrite: NEGATIVE
pH: 5.5 (ref 5.0–8.0)

## 2010-09-27 LAB — LACTATE DEHYDROGENASE: LDH: 151 U/L (ref 94–250)

## 2010-09-27 LAB — LEGIONELLA ANTIGEN, URINE: Legionella Antigen, Urine: NEGATIVE

## 2010-09-27 LAB — CULTURE, BLOOD (ROUTINE X 2)
Culture: NO GROWTH
Culture: NO GROWTH

## 2010-09-27 LAB — LIPID PANEL
HDL: 31 mg/dL — ABNORMAL LOW (ref >39)
LDL Cholesterol: 19 mg/dL (ref 0–99)
Total CHOL/HDL Ratio: 3.4 RATIO
Triglycerides: 273 mg/dL — ABNORMAL HIGH (ref <150)
VLDL: 55 mg/dL — ABNORMAL HIGH (ref 0–40)

## 2010-09-27 LAB — BLOOD GAS, ARTERIAL
Bicarbonate: 21 mEq/L (ref 20.0–24.0)
O2 Saturation: 98.7 %
Patient temperature: 98.6
TCO2: 22.1 mmol/L (ref 0–100)
pH, Arterial: 7.393 (ref 7.350–7.450)
pO2, Arterial: 105 mmHg — ABNORMAL HIGH (ref 80.0–100.0)

## 2010-09-27 LAB — CARDIAC PANEL(CRET KIN+CKTOT+MB+TROPI)
CK, MB: 2 ng/mL (ref 0.3–4.0)
Relative Index: 0.5 (ref 0.0–2.5)
Total CK: 454 U/L — ABNORMAL HIGH (ref 7–232)
Troponin I: 0.02 ng/mL (ref 0.00–0.06)
Troponin I: 0.03 ng/mL (ref 0.00–0.06)
Troponin I: 0.04 ng/mL (ref 0.00–0.06)

## 2010-09-27 LAB — MAGNESIUM: Magnesium: 2.2 mg/dL (ref 1.5–2.5)

## 2010-09-27 LAB — URINE MICROSCOPIC-ADD ON

## 2010-09-27 LAB — PROCALCITONIN: Procalcitonin: 5.53 ng/mL

## 2010-09-27 LAB — HIV ANTIBODY (ROUTINE TESTING W REFLEX): HIV: NONREACTIVE

## 2010-09-27 LAB — LACTIC ACID, PLASMA: Lactic Acid, Venous: 2.5 mmol/L — ABNORMAL HIGH (ref 0.5–2.2)

## 2010-10-01 ENCOUNTER — Other Ambulatory Visit: Payer: Self-pay | Admitting: Internal Medicine

## 2010-10-01 ENCOUNTER — Encounter: Payer: Self-pay | Admitting: Internal Medicine

## 2010-10-01 DIAGNOSIS — E118 Type 2 diabetes mellitus with unspecified complications: Secondary | ICD-10-CM

## 2010-10-01 LAB — GLUCOSE, CAPILLARY
Glucose-Capillary: 318 mg/dL — ABNORMAL HIGH (ref 70–99)
Glucose-Capillary: 333 mg/dL — ABNORMAL HIGH (ref 70–99)

## 2010-10-01 NOTE — Progress Notes (Signed)
I updated insulin doses based upon visit note from patient's endocrinologist Dr. Sharl Ma, who saw patient 09/24/2010 and increased his dose of Lantus and Novolog (or Humalog).

## 2010-10-01 NOTE — Assessment & Plan Note (Signed)
I updated insulin doses based upon visit note from patient's endocrinologist Dr. Kerr, who saw patient 09/24/2010 and increased his dose of Lantus and Novolog (or Humalog). 

## 2010-10-05 LAB — GLUCOSE, CAPILLARY
Glucose-Capillary: 271 mg/dL — ABNORMAL HIGH (ref 70–99)
Glucose-Capillary: 294 mg/dL — ABNORMAL HIGH (ref 70–99)

## 2010-10-06 ENCOUNTER — Other Ambulatory Visit: Payer: Self-pay | Admitting: Internal Medicine

## 2010-10-07 NOTE — Telephone Encounter (Signed)
Rx called in 

## 2010-10-07 NOTE — Telephone Encounter (Signed)
Refill approved - nurse to complete. 

## 2010-10-13 LAB — GLUCOSE, CAPILLARY: Glucose-Capillary: 145 mg/dL — ABNORMAL HIGH (ref 70–99)

## 2010-10-14 LAB — CBC
HCT: 41.2 % (ref 39.0–52.0)
MCV: 90.1 fL (ref 78.0–100.0)
Platelets: 151 10*3/uL (ref 150–400)
RBC: 4.57 MIL/uL (ref 4.22–5.81)
WBC: 5.4 10*3/uL (ref 4.0–10.5)

## 2010-10-14 LAB — BASIC METABOLIC PANEL
BUN: 16 mg/dL (ref 6–23)
Chloride: 103 mEq/L (ref 96–112)
GFR calc Af Amer: >60 mL/min (ref >60)
GFR calc non Af Amer: 50 mL/min — ABNORMAL LOW (ref >60)
Potassium: 4.3 mEq/L (ref 3.5–5.1)

## 2010-10-14 LAB — GLUCOSE, CAPILLARY: Glucose-Capillary: 219 mg/dL — ABNORMAL HIGH (ref 70–99)

## 2010-10-15 LAB — GLUCOSE, CAPILLARY: Glucose-Capillary: 357 mg/dL — ABNORMAL HIGH (ref 70–99)

## 2010-10-17 ENCOUNTER — Encounter (HOSPITAL_BASED_OUTPATIENT_CLINIC_OR_DEPARTMENT_OTHER): Payer: PRIVATE HEALTH INSURANCE | Attending: General Surgery

## 2010-10-17 DIAGNOSIS — E1169 Type 2 diabetes mellitus with other specified complication: Secondary | ICD-10-CM | POA: Insufficient documentation

## 2010-10-17 DIAGNOSIS — L97509 Non-pressure chronic ulcer of other part of unspecified foot with unspecified severity: Secondary | ICD-10-CM | POA: Insufficient documentation

## 2010-10-18 LAB — GLUCOSE, CAPILLARY: Glucose-Capillary: 142 mg/dL — ABNORMAL HIGH (ref 70–99)

## 2010-10-20 LAB — GLUCOSE, CAPILLARY: Glucose-Capillary: 85 mg/dL (ref 70–99)

## 2010-10-22 ENCOUNTER — Encounter (HOSPITAL_BASED_OUTPATIENT_CLINIC_OR_DEPARTMENT_OTHER): Payer: PRIVATE HEALTH INSURANCE | Attending: General Surgery

## 2010-10-22 DIAGNOSIS — L97509 Non-pressure chronic ulcer of other part of unspecified foot with unspecified severity: Secondary | ICD-10-CM | POA: Insufficient documentation

## 2010-10-22 DIAGNOSIS — E1169 Type 2 diabetes mellitus with other specified complication: Secondary | ICD-10-CM | POA: Insufficient documentation

## 2010-10-27 ENCOUNTER — Ambulatory Visit (INDEPENDENT_AMBULATORY_CARE_PROVIDER_SITE_OTHER): Payer: Medicare Other | Admitting: Ophthalmology

## 2010-10-27 VITALS — BP 138/85 | HR 80 | Temp 97.1°F | Ht 78.0 in | Wt 377.9 lb

## 2010-10-27 DIAGNOSIS — R609 Edema, unspecified: Secondary | ICD-10-CM

## 2010-10-27 DIAGNOSIS — N189 Chronic kidney disease, unspecified: Secondary | ICD-10-CM

## 2010-10-27 DIAGNOSIS — E782 Mixed hyperlipidemia: Secondary | ICD-10-CM

## 2010-10-27 DIAGNOSIS — G4733 Obstructive sleep apnea (adult) (pediatric): Secondary | ICD-10-CM

## 2010-10-27 LAB — POCT GLYCOSYLATED HEMOGLOBIN (HGB A1C): Hemoglobin A1C: 8.4

## 2010-10-27 LAB — GLUCOSE, CAPILLARY: Glucose-Capillary: 123 mg/dL — ABNORMAL HIGH (ref 70–99)

## 2010-10-27 NOTE — Assessment & Plan Note (Signed)
This is stable, I have told the patient to continue his Lasix, and try elevating his legs during the day when he is not walking.

## 2010-10-27 NOTE — Assessment & Plan Note (Signed)
This is stable at this time, we'll continue to closely manage the patient's diabetes and hypertension to avoid further renal decline.

## 2010-10-27 NOTE — Patient Instructions (Signed)
Please continue taking her insulin regularly, I'm not adjusting her dose today. Please followup with Dr. Sharl Ma next month. Please return in 2 months for followup with your regular physician.

## 2010-10-27 NOTE — Assessment & Plan Note (Addendum)
At this point, the patients diabetes control is improving and the patient is progressing towards their goal of A1C <7.  A.m. CBGs are still slightly elevated but has gone from the 2-300 range to the 200 range since his medication change. I will make no adjustments in the patients medications today.  I will repeat the patient's hemoglobin A1c, and encouraged the patient to followup with his endocrinologist Dr. Sharl Ma next month.  I did  Encourage the patient to continue regularly checking their post prandial CBG's, as well as to continue checking their feet for lesions daily.  I also recommended that the patient continue to watch their diet and avoid high glycemic index foods.  The patient denies any barriers to these recommendations.   Lab Results  Component Value Date   HGBA1C 8.9 07/16/2010   HGBA1C 8.4 04/22/2010   HGBA1C 10.6 02/05/2010   Lab Results  Component Value Date   MICROALBUR 0.51 11/12/2009   LDLCALC 39 09/23/2010   CREATININE 1.51* 09/23/2010

## 2010-10-27 NOTE — Assessment & Plan Note (Signed)
The patient is regularly using his CPAP and is getting a new mask today.

## 2010-10-27 NOTE — Assessment & Plan Note (Signed)
The patient's last fasting lipid panel demonstrated an LDL of 39, and an HDL of 29, this is improved from prior, will continue atorvastatin at this time.

## 2010-10-27 NOTE — Progress Notes (Signed)
Subjective:    Patient ID: [name], male    DOB: September 08, 1950, 60 y.o.   MRN: [id number]  HPI  This is a [age]-year-old male with a past medical history significant for uncontrolled type 2 diabetes, hypertension, and hyperlipidemia, who presents for routine followup. The patient saw his endocrinologist Dr. Sharl Ma on 3/14 and the patient's Lantus and NovoLog doses were adjusted. Since that time, the patient is now taking 140 units of Lantus at night, as well as NovoLog 44 units a.m., 56 units PM. Since his last visit, the patient has not noted an appreciable difference in his blood sugar control, fasting CBGs and are in the 200 range and typically in the 100 range by dinner. The patient has had occasional hypoglycemic episodes which are improved with by mouth intake. CBGs at those times has been in the high 50s. The patient is scheduled to see Dr. Sharl Ma again in one month, and has not yet seen an eye doctor. With regards to the patient's denture pain, this is completely resolved. The patient continues to have some mild lower extremity edema, but states that his Lasix helps to improve this. Review of Systems  Constitutional: Negative for fever and chills.  Respiratory: Negative for cough and shortness of breath.   Cardiovascular: Negative for chest pain and palpitations.  Gastrointestinal: Negative for vomiting, diarrhea and constipation.       Objective:   Physical Exam  Constitutional: He appears well-developed and well-nourished.  HENT:  Head: Normocephalic and atraumatic.  Eyes: Pupils are equal, round, and reactive to light.  Cardiovascular: Normal rate, regular rhythm and intact distal pulses.  Exam reveals no gallop and no friction rub.   No murmur heard. Pulmonary/Chest: Effort normal and breath sounds normal. He has no wheezes. He has no rales.  Abdominal: Soft. Bowel sounds are normal. He exhibits no distension. There is no tenderness.  Musculoskeletal: Normal range of motion.       1 + Pitting edema  Neurological: He is alert. No cranial nerve deficit.  Skin: No rash noted.        Current Outpatient Prescriptions on File Prior to Visit  Medication Sig Dispense Refill  . amitriptyline (ELAVIL) 75 MG tablet Take 75 mg by mouth daily.        Marland Kitchen aspirin 81 MG EC tablet Take 81 mg by mouth daily.        Marland Kitchen atorvastatin (LIPITOR) 20 MG tablet Take 20 mg by mouth daily.        . furosemide (LASIX) 20 MG tablet Take 20 mg by mouth 2 (two) times daily. Take two tablets       . gabapentin (NEURONTIN) 100 MG capsule Take 100 mg by mouth 3 (three) times daily.        Marland Kitchen glucose blood (TRUETRACK TEST) test strip 1 each by Other route as needed. Use to test blood sugar before meals and at bedtime       . HYDROcodone-acetaminophen (NORCO) 5-325 MG per tablet Take one and one-half (1 & 1/2) tablets by mouth every six (6) hours as needed for pain.  180 tablet  1  . insulin glargine (LANTUS) 100 UNIT/ML injection Inject 140 Units into the skin daily.  40 mL  3  . insulin lispro (HUMALOG KWIKPEN) 100 UNIT/ML injection Inject 44 units before breakfast, 56 units before lunch, and 56 units before dinner, plus 1 unit for every 10 mg/dl above 409 mg/dl, three times a day at meals.   60 mL  3  . Insulin Pen Needle (PRODIGY SHORT PEN NEEDLES) 31G X 8 MM MISC by Does not apply route as directed. For insulin injection two times a day       . Insulin Syringe-Needle U-100 31G X 5/16" 1 ML MISC Use as directed.      . mucosal barrier oral (GELCLAIR) GEL Mix contents of one packet with small amount of water, then rinse mouth for 1 minute and spit out; use three times a day.  30 packet  1  . omeprazole (PRILOSEC) 20 MG capsule Take 20 mg by mouth daily.        Marland Kitchen PRODIGY TWIST TOP LANCETS 28G MISC by Does not apply route as directed. Use to test blood sugars before meals and at bedtime each day       . sildenafil (VIAGRA) 50 MG tablet Take 50 mg by mouth daily as needed. Take one tablet 1 hour before  sexual activity. Do not take more than 1 per day.         Past Medical History  Diagnosis Date  . Diabetes mellitus     w/complication NOS, type II  . Edema, macular, due to secondary diabetes   . Retinopathy   . Neuropathy, lower extremity   . Hyperlipidemia   . Chronic renal insufficiency   . GERD (gastroesophageal reflux disease)   . Erectile dysfunction   . Osteomyelitis of ankle and foot   . Hemorrhoids      Assessment & Plan:

## 2010-10-29 ENCOUNTER — Other Ambulatory Visit: Payer: Self-pay | Admitting: *Deleted

## 2010-10-29 DIAGNOSIS — IMO0001 Reserved for inherently not codable concepts without codable children: Secondary | ICD-10-CM

## 2010-10-29 DIAGNOSIS — E118 Type 2 diabetes mellitus with unspecified complications: Secondary | ICD-10-CM

## 2010-10-29 MED ORDER — INSULIN LISPRO 100 UNIT/ML ~~LOC~~ SOLN: SUBCUTANEOUS | Status: DC

## 2010-10-29 NOTE — Telephone Encounter (Signed)
Humalog Kwikpen rx call in to [facility].

## 2010-10-29 NOTE — Telephone Encounter (Signed)
[facility] needs a new RX.  Thanks

## 2010-10-29 NOTE — Telephone Encounter (Signed)
Refill approved - nurse to complete. 

## 2010-11-19 ENCOUNTER — Encounter (HOSPITAL_BASED_OUTPATIENT_CLINIC_OR_DEPARTMENT_OTHER): Payer: Medicare Other

## 2010-11-25 NOTE — Procedures (Signed)
DUPLEX DEEP VENOUS EXAM - LOWER EXTREMITY   INDICATION:  Venous insufficiency and peripheral vascular disease.   HISTORY:  Edema:  Complaint of right lower extremity edema for about a  month.  However, the patient does have a known history.  Trauma/Surgery:  History of left third toe amputation with a recent  problem with the left lower extremity requiring a cast.  Pain:  No  PE:  No  Previous DVT:  No  Anticoagulants:  Other:   DUPLEX EXAM:                CFV   SFV   PopV  PTV    GSV                R  L  R  L  R  L  R   L  R  L  Thrombosis    o  o  o  o  o  o  o      o  o  Spontaneous   +  +  +  +  +  +  +      +  +  Phasic        +  +  +  +  +  +  +      +  +  Augmentation  +  +  +  +  +  +  +      +  +  Compressible  +  +  +  +  +  +  +      +  +  Competent     +  +  +  +  +  +  +      +  +   Legend:  + - yes  o - no  p - partial  D - decreased   IMPRESSION:  1. No evidence of deep vein thrombosis or venous insufficiency noted      in the right lower extremity.  2. No evidence of deep vein thrombosis or venous insufficiency noted      in the left femoral popliteal venous system; however, the left calf      veins could not be imaged due to a cast of the left calf and foot      area.  3. Incidental finding:  Enlarged lymph nodes noted in the bilateral      groin regions.  4. A preliminary report was faxed to Dr. Thomasene Lot office on October 25, 2009.    _____________________________  Larina Earthly, M.D.   CH/MEDQ  D:  10/29/2009  T:  10/29/2009  Job:  [id number]

## 2010-11-25 NOTE — Assessment & Plan Note (Signed)
[facility]   NAME:  [name], [name]            ACCOUNT NO.:  [id number]   MEDICAL RECORD NO.:  [id number]      DATE OF BIRTH:  March 05, 1951   PHYSICIAN:  [name]. Tanda Farrell, M.D. VISIT DATE:  11/18/2006                                   OFFICE VISIT   SUBJECTIVE:  [name] is a 60 year old man who has been managed in  the Wound Center on multiple occasions.  Most recently he was seen for  an ulceration on the right hallux.  He was treated with return to custom  orthotics and topical application of Iodosorb gel.  He returns for  followup.  He denies interim fever.  He is complaining of dysstatic  pains.  He denies interim trauma, swelling or fever.   OBJECTIVE:  Vital signs:  Blood pressure is 130/87, respirations 20,  pulse rate 81, temperature is 98.1.  Capillary blood glucose is 166 mg%.  Inspection of the wounds shows that there has been impacted Iodosorb.  This was debrided free with a 10 blade disclosing near complete  reepithelialization.  There is no drainage at all.  The pedal pulses  remain intact.  There are no injuries on the left foot since the patient  remain in sensate.   ASSESSMENT:  Resolved Wagner grade 2 diabetic foot ulcer.   PLAN:  We are discharging the patient to continue his followup via the  [facility] via Dr. Kathyrn Sheriff.  We have counseled the patient  regarding the necessity for control of his diabetes and we have strongly  encouraged him to form a relationship with a podiatrist for routine  diabetic foot care.  We are discharging him.      [name], M.D.  Electronically Signed     HAN/MEDQ  D:  11/18/2006  T:  11/18/2006  Job:  [id number]   cc:   [name], M.D.

## 2010-11-25 NOTE — Assessment & Plan Note (Signed)
[facility]   NAME:  JAMALL, STROHMEIER            ACCOUNT NO.:  [id number]   MEDICAL RECORD NO.:  [id number]      DATE OF BIRTH:  1950-10-01   PHYSICIAN:  Theresia Majors. Tanda Rockers, M.D. VISIT DATE:  04/25/2007                                   OFFICE VISIT   SUBJECTIVE:  [name] is a 60 year old man who was treated in  the past at the wound center for [facility] grade 4 diabetic foot ulcer over  several weeks and with the use of hyperbaric oxygen, Apligraf and a  wound vac.  His wounds completely resolved.  He was fitted for and  received custom inserts and extra-depth shoes.  He was subsequently  discharged to continue his medical care via the family practice clinic.   With the change in the medical staff at the family practice clinic the  patient was evaluated most recently by Dr. Yetta Barre.  The patient was  referred to the wound center for routine foot care.  In the interim the  patient has worn the shoes and inserts.  He has not worn them recently.  He is now wearing a pair of canvas athletic shoes.  There has been no  drainage, there is been no injury.  His diabetes has been not very well  controlled.  He did not take his insulin over the weekend and check of  his sugars suggested values greater than 500.  There has been no pain,  there has been no recurrent injury.   OBJECTIVE:  Blood pressure is 139/91, respirations 18, pulse rate 81,  temperature is 97.8, capillary blood glucose is 320 mg percent.  Inspection of the lower extremity shows that there is trace edema, both  feet are warm.  There are areas of callus over the right great toe and  also on the left plantar surface.  The amputated site of the third digit  of the left foot is well healed with an area of callus.  There is no  drainage.  The pedal pulse remains bilaterally palpable.  The patient  remains insensate.   CLINICAL IMPRESSION:  No active wounds.   PLAN:  We are discharging the patient from  the wound center.  We  strongly encouraged him to pursue and maintain his relationship with one  of the local podiatrists for routine diabetic foot care.  We have  encouraged him to contact his primary care physician for evaluation and  regulation of his diabetes.  We have reviewed the relationship between  uncontrolled diabetes, the loss of vision, the development of renal  failure requiring dialysis, the possibility of stroke, heart attack and  major amputation.  We have encouraged the patient to keep his  appointments as prescribed by his physician.   The patient is currently unemployed but his wife is employed and has  health care benefits through her employer.  We have suggested that the  patient discuss, he and his wife, the necessity to add him on her health  insurance as we anticipate complications related to his diabetes in the  future.   We have given the patient an opportunity to ask questions.  He seems to  understand the instructions.  We have issued him a new prescription for  extra-depth shoes and custom inserts.  We have encouraged him to procure  the  foot wear and to wear it.  We have emphasized that the use of the canvas  athletic shoe puts him at risk of developing additional ulcers and the  necessitation for readmission to the wound center for wound care.  The  patient seems to understand and indicates that he will be compliant      Harold A. Tanda Rockers, M.D.  Electronically Signed     HAN/MEDQ  D:  04/25/2007  T:  04/25/2007  Job:  [id number]   cc:   Dr. Yetta Barre

## 2010-11-28 ENCOUNTER — Other Ambulatory Visit (HOSPITAL_BASED_OUTPATIENT_CLINIC_OR_DEPARTMENT_OTHER): Payer: Self-pay | Admitting: General Surgery

## 2010-11-28 ENCOUNTER — Ambulatory Visit (HOSPITAL_COMMUNITY)
Admission: RE | Admit: 2010-11-28 | Discharge: 2010-11-28 | Disposition: A | Discharge status: Home / Self Care | Payer: PRIVATE HEALTH INSURANCE | Source: Ambulatory Visit | Attending: General Surgery | Admitting: General Surgery

## 2010-11-28 ENCOUNTER — Encounter (HOSPITAL_BASED_OUTPATIENT_CLINIC_OR_DEPARTMENT_OTHER): Payer: PRIVATE HEALTH INSURANCE | Attending: General Surgery

## 2010-11-28 DIAGNOSIS — S98139A Complete traumatic amputation of one unspecified lesser toe, initial encounter: Secondary | ICD-10-CM | POA: Insufficient documentation

## 2010-11-28 DIAGNOSIS — M79609 Pain in unspecified limb: Secondary | ICD-10-CM | POA: Insufficient documentation

## 2010-11-28 DIAGNOSIS — E1169 Type 2 diabetes mellitus with other specified complication: Secondary | ICD-10-CM | POA: Insufficient documentation

## 2010-11-28 DIAGNOSIS — M21969 Unspecified acquired deformity of unspecified lower leg: Secondary | ICD-10-CM | POA: Insufficient documentation

## 2010-11-28 DIAGNOSIS — L84 Corns and callosities: Secondary | ICD-10-CM | POA: Insufficient documentation

## 2010-11-28 DIAGNOSIS — L97509 Non-pressure chronic ulcer of other part of unspecified foot with unspecified severity: Secondary | ICD-10-CM | POA: Insufficient documentation

## 2010-11-28 DIAGNOSIS — M7989 Other specified soft tissue disorders: Secondary | ICD-10-CM | POA: Insufficient documentation

## 2010-11-28 DIAGNOSIS — M869 Osteomyelitis, unspecified: Secondary | ICD-10-CM

## 2010-11-28 NOTE — Assessment & Plan Note (Signed)
[facility]   NAME:  [name], [name]            ACCOUNT NO.:  [id number]   MEDICAL RECORD NO.:  [id number]      DATE OF BIRTH:  1950-08-23   PHYSICIAN:  Jonelle Sports. Sevier, M.D.  VISIT DATE:  04/27/2006                                     OFFICE VISIT   HISTORY:  This 60 year old black male is currently undergoing hyperbaric  oxygen therapy for a stage 4 diabetic ulcer of the left foot.  This therapy  was begun after he had surgical debridement of the second metatarsal head  for deep infection in the left foot in the left second and third digital  interspace.  His sixth hyperbaric oxygen treatment was completed today and  he has done well.   On his visit previously, he had grown out a light growth of MRSA and had  been given prescriptions for doxycycline and Septra DS.  He reports, today,  he does not fill those prescriptions due to lack of funds, but that he has  an appointment scheduled [facility]  week for consideration of help there.   The patient reports that he continues to have some pain in that foot at  night and is on pain medication from his primary care physician.  But,  again, has been unable to get that because of cost situations.  He has noted  persistence of serosanguineous drainage from the wound, but denies any  significant malodor.  There is persistent swelling, but he does not feel  that this has worsened.   EXAMINATION:  Vital signs were done today, prior to hyperbaric oxygen, and  they are on the record in that portion of the patient's medical record and  were satisfactory.   The wound, in the interspace between the second and third toes on the left  foot, is examined today and found to be approximately 2 cm in length, 0.7 cm  in width, and 2.4 cm in depth.  There is no significant odor, there is some  slight blood drainage, there is some maceration of the wound margins.  No  purulence can be  expressed from the wound and there does not appear to be  undue tenderness.  The entire foot, particularly the forefoot section, has  some chronic soft swelling.   DISPOSITION:  The wound is sharply debrided of some of the macerated tissues  at its margins and also 2 to 3 pieces of loose fat from the wound base.   The wound is then repacked with iodoform gauze and a small, absorptive,  interdigital dressing is put in place.   The patient will return tomorrow to continue his hyperbaric oxygen  therapies.           ______________________________  Jonelle Sports Cheryll Cockayne, M.D.     RES/MEDQ  D:  04/27/2006  T:  04/27/2006  Job:  [id number]

## 2010-11-28 NOTE — Assessment & Plan Note (Signed)
[facility]   NAME:  Patrick, Farrell            ACCOUNT NO.:  [id number]   MEDICAL RECORD NO.:  [id number]      DATE OF BIRTH:  July 06, 1951   PHYSICIAN:  Theresia Majors. Tanda Rockers, M.D.      VISIT DATE:                                   OFFICE VISIT   SUBJECTIVE:  [name] returns for followup of an ulceration on his  right great toe.  We were treating the patient by asking him to return  to wearing his custom orthotics and extra depth shoes.  The origin of  his wound we felt was related to mal fitting shoes.  He has since been  wearing the white sock, utilizing the antiseptic soap and wearing the  custom shoes and inserts.  He denies drainage, malodor, pain or fever.   OBJECTIVE:  Blood pressure is 129/83.  Respirations 20.  Pulse rate 108.  Temperature 99.7.  Capillary blood glucose is 159 mg%.  Inspection of  the lower extremities shows that the ulceration at the tip of the right  hallux is completely heeled.  There is no drainage.  There is complete  reepithelialization.  The pedal pulse remains palpable.  There is no  evidence of infection.   ASSESSMENT:  Resolved wound.   PLAN:  We are discharging the patient.  He is to continue to wear his  custom orthotics and extra depth shoes.  We will reevaluate him on a  p.r.n. basis as referred by the primary care physician.           ______________________________  Theresia Majors. Tanda Rockers, M.D.     [name]  D:  09/14/2006  T:  09/14/2006  Job:  [id number]   cc:   [facility]

## 2010-11-28 NOTE — Assessment & Plan Note (Signed)
[facility]   NAME:  [name], Farrell            ACCOUNT NO.:  [id number]   MEDICAL RECORD NO.:  [id number]      DATE OF BIRTH:  1951-03-07   PHYSICIAN:  Theresia Majors. Tanda Rockers, M.D. VISIT DATE:  05/15/2006                                     OFFICE VISIT   SUBJECTIVE:  [name] is a 60 year old diabetic with a __________  grade IV ulceration involving his left foot.  We have treated him with  hyperbaric oxygen for 26 treatments.  He returns for wound evaluation.  In  the interim, he denies pain, fever, or excessive drainage.   OBJECTIVE:  Blood pressure is 126/92, pulse rate 62, respirations 18.  Capillary blood glucose prior to his HBO was 327.  Following his HBO he was  291.  Inspection of the left foot and the third toe open wound shows that  there has been significant shrinkage, there is no excessive malodor, there  is no exudate or drainage.  The wound had areas of necrosis and desquamation  at the periphery; this was debrided sharply with hemorrhage control with  direct pressure.   ASSESSMENT:  Continued improvement with hyperbaric oxygen.   PLAN:  We will continue the hyperbaric oxygen to complete 30 treatments.  We  will also proceed with custom footwear fitting.  We will reevaluate the  patient in five days.           ______________________________  Theresia Majors Tanda Rockers, M.D.     [name]  D:  05/25/2006  T:  05/26/2006  Job:  [id number]

## 2010-11-28 NOTE — Assessment & Plan Note (Signed)
[facility]   NAME:  [name], [name]            ACCOUNT NO.:  [id number]   MEDICAL RECORD NO.:  [id number]      DATE OF BIRTH:  September 30, 1950   PHYSICIAN:  Theresia Majors. Tanda Rockers, M.D.      VISIT DATE:                                   OFFICE VISIT   SUBJECTIVE:  Mr. Milnes is a 60 year old man who has been treated in  the wound center for a left Wagner grade 4 diabetic foot ulcer.  He has  completed a course of hyperbaric oxygen treatment and we have provided  for his custom orthotics and shoes via the [facility], he has  received those shoes and he returns to the clinic for final discharge.  In the interim, he reports that he has had some swelling in his left  lower extremity, but he denies fever or drainage, he continues to  exercise diabetic foot hygiene.   OBJECTIVE:  See the nurse's notes for the vital signs.  His capillary  blood glucose this morning is 141 mg percent.  Inspection of the lower  extremity shows that the wound is completely resolved.  There is trace  edema and mild changes of stasis.   ASSESSMENT:  Resolution of Wagner grade 4 diabetic foot ulcer.   PLAN:  This patient is discharged from the wound center, he is to  continue his routine followup at the [facility] with Dr.  Kathyrn Sheriff, we are recommending that he have his custom shoes and orthotics  adjusted at three-to-four-month intervals.  We have explained the  potential for recurrence of ulceration to the patient in terms that he  seems to understand.  He expresses gratitude for having been seen in the  wound center and indicates that he will keep his appointments with the  [facility].           ______________________________  Theresia Majors. Tanda Rockers, M.D.     [name]  D:  07/19/2006  T:  07/19/2006  Job:  [id number]   cc:   [name], M.D.

## 2010-11-28 NOTE — Discharge Summary (Signed)
[name], [name]            ACCOUNT NO.:  [id number]   MEDICAL RECORD NO.:  [id number]          PATIENT TYPE:  INP   LOCATION:  5021                         FACILITY:  [facility]   PHYSICIAN:  [name], M.D.     DATE OF BIRTH:  January 12, 1951   DATE OF ADMISSION:  01/21/2006  DATE OF DISCHARGE:  02/09/2006                                 DISCHARGE SUMMARY   DISCHARGE DIAGNOSES:  1. Left foot cellulitis status post third toe amputation and irrigation      and debridement.  2. Diabetes mellitus type 2.  3. Acute renal failure secondary to acute interstitial nephritis secondary      to antibiotics.   DISCHARGE MEDICATIONS:  1. Glipizide 10 mg daily.  2. Augmentin 500 mg b.i.d. for 10 days.  3. Percocet 1-2 tablets q. 4-6 hours p.r.n. pain.  4. NovoLog insulin at meals.  The patient is to test blood sugar with      Glucometer, for readings greater than 200 inject 4 units.   DISPOSITION:  The patient is to return for a lab draw 1 week after discharge  on February 16, 2006 at 2:00 p.m. to have a BMET for his creatinine to be  checked.  He is to followup with Dr. Marylen Ponto in the outpatient clinic on  March 01, 2006 where he will be receiving a CBC and BMET where his renal  function will be evaluated and further diabetes followup will be evaluated.  The patient is also going to receive diabetes counseling as an outpatient  here at [facility].   PROCEDURE:  1. A 3 view x-ray of the left foot yielded gaps in the soft tissues around      the bases of the second and third toes and distal metatarsals.  2. Dr. Gean Birchwood from orthopedics performed a radical irrigation and      debridement of the left foot with third MTP joint amputation.  3. A CT of the left lower extremity revealed extensive edema and      subcutaneous gaps consistent with infection throughout the medial side      of the forefoot but showed no evidence of foreign body or abscess.  The      extent of the  subcutaneous air immediately adjacent to the phalanges      and metatarsals made osteomyelitis a concern.  4. On July 22nd the patient received an abdominal ultrasound to rule out      post renal obstruction.  The results of this showed no evidence for      cholelithiasis but it did show increased renal parenchymal echotexture      stating that this could be a sign of medical renal disease.   CONSULTATIONS:  1. Dr. Gean Birchwood of orthopedics.  2. Dr. Mills Koller.  3. Dr. Burnice Logan of infectious disease.   ADMITTING HISTORY AND PHYSICAL:  Included a 60 year old African American  male with known history of diabetes presented with left foot pain and  swelling.  He was seen at an urgent care clinic 5 days prior to presentation  and was given  Keflex without any improvement.  The patient reported no  trauma to his foot.  He did report chills and sweats and vomiting and nausea  a few days before presentation as well as a red drainage from his foot.  The  patient did not know his last tetanus vaccination.  He did report increased  thirst but not increased urination.  He had anorexia and GI upset secondary  to starting the antibiotics which were Keflex so he had stopped the  antibiotics 1 day prior to admission to the hospital.   ALLERGIES:  NO KNOWN DRUG ALLERGIES.   PAST MEDICAL HISTORY:  The patient had no significant past medical history  to his knowledge.   MEDICATIONS:  The only medications the patient was taking was Keflex 500 mg  q.i.d. from January 16, 2006 to January 20, 2006 and had been taking Tylenol and  Advil as needed for pain.   SOCIAL HISTORY:  Note the patient is a current smoker with 1-3 packs per  week.  He does drink alcohol 2-3 times per week and has used cocaine in the  past year.   PHYSICAL EXAMINATION:  Vital signs: Temperature of 98.0, blood pressure  120/70, pulse of 95, respiratory rate 11, O2 sat 98% on 3 liters.  General: Normal cardiovascular, respiratory  and GI exam with an exam of his  left foot showing on the plantar surface a deep cut in the third, second  phalanges with a 3 x 3 cm abscess below the second and third toes.  There is  erythema and swelling that extended to the calf.   ADMISSION LABS:  Included sodium of 135, potassium of 3.9, bicarb of 20, BUN  13, creatinine of 1.3, glucose of 337, white count of 15, hemoglobin of 13,  platelets of 283 and AST of 13.5.  MCV of 90.8.  Ani and gap of 18.  Bilirubin 2.2, alkaline phosphatase 133, AST of 16, ALT of 18, protein 76,  albumin 2.8, calcium 9.8.  A UA showed 30 protein, and greater than 80  ketones and greater than 1000 urine glucose.  See chart for further  admission details.   HOSPITAL COURSE:  1. Left lower extremity cellulitis.  On the day of admission, the patient      was immediately taken to the operating room by Dr. Gean Birchwood for      radical irrigation and debridement of the left lower extremity with      third MTP joint amputation.  He was then started on clindamycin,      vancomycin and Zosyn for coverage of MRSA as well as negative gram      positive.  Throughout the course of his hospital stay, his erythema      that extended to his calf did not immediately improve.  He was      monitored daily by wound care who administered a wound vac and      maintained the wound vac during most of his hospital stay.  His      antibiotics were changed secondary to lack of dramatic improvement in      the left lower extremity cellulitis and due to a dramatic increase in      the patient's creatinine levels.  After antibiotics were changed to      cover for pseudomonal coverage and gram negative rods and doxycycline      for gram positive coverage, the patient had dramatic improvement in the  left lower extremity cellulitis as evidence by dramatic decrease in      swelling and erythema as well as dramatic decrease in pain.  The     patient was seen by Dr. Tanda Rockers who  evaluated him for further wound      care and he was going to be seen at the wound care center for      hyperbaric oxygen therapy at [facility] as an outpatient.  2. Diabetes mellitus type 2. The patient was unaware that he had developed      diabetes before presentation to the hospital.  He came in with a blood      glucose elevated to 337 upon admission.  Hemoglobin A1c was      subsequently drawn and it revealed that his hemoglobin A1c was 12.6.      He was subsequently put on sliding scale insulin during his hospital      stay and glipizide was added for further control and he had excellent      control during his hospital stay with CBGs ranging from 70s to 130s      with this regimen.  The patient was to receive outpatient diabetes      counseling as well as continue glipizide, Glucometer for regular blood      glucose reading and administer insulin based on those readings and he      was to followup in the outpatient clinic to further evaluate his      diabetes as an outpatient.  3. Acute renal failure.  The patient's creatinine upon admission was 1.3.      However, on day 6 admission his creatinine increased to 2.9 and      continued to increase to a high of 3.2.  This was thought likely to be      secondary to antibiotics but a full investigation of prerenal versus      post renal causes were investigated and the patient subsequently      received an abdominal ultrasound to evaluate for obstruction.  Urine      microscopy yielded no eosinophils however a CBC yielded increased      eosinophils in the serum.  It was concluded that his acute renal      failure was likely secondary to acute interstitial nephritis secondary      to antibiotics and his increases eosinophils in his serum supported      this diagnosis especially when we took into account that when the      antibiotics were changed, his creatinine rapidly normalized.  Upon      discharge, it had not come down to  his baseline admission creatinine,      however it was trending down.  Creatinine 4 days prior to discharge was      3.1.  It came down to 2.9 and then 2.7 and then 2.4 at discharge.   DISCHARGE LABS:  Sodium of 139, potassium 4.2, chloride 105, bicarb 27,  glucose 113, BUN 21, creatinine 2.4, calcium 9.2.  CBC: White blood count  5.7, hemoglobin 11.2, platelet count 289.  Vitals: Temperature of 97.6,  pulse of 81, respirations 18, blood pressure 122/72.  CBG 87.  O2 sat 99% on  room air with weight of 225.4 pounds.      [name], M.D.  Electronically Signed     CA/MEDQ  D:  02/11/2006  T:  02/11/2006  Job:  [id number]

## 2010-11-28 NOTE — Op Note (Signed)
[name], [name]            ACCOUNT NO.:  [id number]   MEDICAL RECORD NO.:  [id number]          PATIENT TYPE:  INP   LOCATION:  2550                         FACILITY:  [facility]   PHYSICIAN:  [name]. [name], M.D.   DATE OF BIRTH:  1951/04/02   DATE OF PROCEDURE:  01/21/2006  DATE OF DISCHARGE:                                 OPERATIVE REPORT   PREOPERATIVE DIAGNOSIS:  Left foot diabetic abscess, anaerobic.   POSTOPERATIVE DIAGNOSIS:  Left foot diabetic abscess, anaerobic.   PROCEDURE:  Radical irrigation and debridement, left foot with third MTP  joint amputation.   SURGEON:  [name]. [name], M.D.   FIRST ASSISTANT:  None.   ANESTHETIC:  Endotracheal.   ESTIMATED BLOOD LOSS:  200 mL.   FLUID REPLACEMENT:  1L crystalloid.   DRAINS PLACED:  None.  The wound was packed open.   TOURNIQUET TIME:  None.   INDICATIONS FOR PROCEDURE:  60 year old gentleman who probably got a  puncture wound in his foot last week was seen at urgent care center in  [place], about four or five days ago, given amoxicillin.  In the last 24  hours, he has developed a bulla over the forefoot, increasing pain, loss of  sensation.  The foot has become markedly swollen and he presented to the  [facility] emergency room, admitted by medicine as a new onset diabetic with a  glucose of 400 and orthopedic consultation was obtained.  I got a CT scan  showing gas in the subcutaneous tissue.  I also aspirated out 6 mL of  greenish watery pus out of the foot in the emergency room.  He is taken for  urgent irrigation and debridement.  Risks and benefits of surgery discussed  with the patient.  There is really no choice here.  It has to be debrided.  The third toe was black, insensate and more likely than not will have to  come off.  The second and fourth toes were all somewhat dusky but appear to  be somewhat viable at this point.   DESCRIPTION OF PROCEDURE:  The patient identified by armband, taken the  operating room at [facility].  Appropriate site monitors were  attached and general endotracheal anesthesia induced with the patient in  supine position.  Tourniquet applied to the left calf and never used.  After  the foot had been prepped and draped, I made an incision starting the base  of the third toe going proximally for about 4 cm through the epidermis and  immediately, a greenish purulent fluid came out, we cut down into the  subcutaneous tissue which was likewise greenish and nonviable.  We debrided  back to healthy tissue, creating a wound was about 2.5 cm across and 2 cm  deep.  The third toe was, in fact, not viable.  The periosteum over the toe  was black and nothing bled.  I went ahead performed a third MTP joint  amputation, allowing further debridement of the subcutaneous tissue going up  into the mid foot where some of the gas was noted to be. Satisfied with the  sharp debridement, we also used rongeurs to remove necrotic tissue as well  and then irrigated with pulse lavage followed by a liter of hydrogen  peroxide followed by more pulse lavage.  The skin edges were back to tissue  that had some blood supply.  In my opinion, the second and fourth toes were  still somewhat questionable about their viability but there were some parts  of the edges that did bleed.  At this point the wound was loosely packed  with half inch Iodoform and a dressing of 4x4s, Kerlix and Ace wrap applied.  The patient was awakened and taken to the recovery room without difficulty.      [name]. [name], M.D.  Electronically Signed     FJR/MEDQ  D:  01/21/2006  T:  01/21/2006  Job:  [id number]   cc:   Alvester Morin, M.D.  Fax: [phone]

## 2010-11-28 NOTE — Assessment & Plan Note (Signed)
[facility]   NAME:  [name], [name]            ACCOUNT NO.:  [id number]   MEDICAL RECORD NO.:  [id number]      DATE OF BIRTH:  [birth year]   PHYSICIAN:  Theresia Majors. Tanda Rockers, M.D.      VISIT DATE:                                     OFFICE VISIT   SUBJECTIVE:  [name] is a 60 year old man with a Wegener IV right  diabetic foot ulcer.  He is currently being treated with Home Health  administering a wound VAC.  He presents complaining of increased pain but no  fever. The drainage is well controlled by the wound VAC.   OBJECTIVE:  Blood pressure 120/70, pulse rate of 84, respirations 20, he is  afebrile. Blood glucose is 130 mg%.   Inspection of the right lower extremity shows that there is trace edema with  chronic changes of stasis. The amputation site of the third toe is open and  adequately drained, there is healthy granulation tissue but there is obvious  ischemic nonviable cartilage of the head of the metatarsal. There is no  tenderness in the Achilles tendon or in the popliteal fossa or in the groin.   ASSESSMENT:  Improved wound with VAC treatment.   PLAN:  We will give the patient a prescription for Vicodin total of 40  tablets to take 2 every 6-8 hours p.r.n. for pain. We have advised the  patient not to take over-the-counter ibuprofen as he has a history of severe  renal insufficiency. We have encouraged him to retain a personal physician  as he has severe diabetes as well as the insult to his kidney function.  These health concerns should be thoroughly followed and managed. I am  certain that the patient understands these instructions but his body  language suggests that he may not follow through.           ______________________________  Theresia Majors. Tanda Rockers, M.D.     [name]  D:  02/25/2006  T:  02/25/2006  Job:  [id number]

## 2010-11-28 NOTE — Discharge Summary (Signed)
[name], [name]            ACCOUNT NO.:  [id number]   MEDICAL RECORD NO.:  [id number]          PATIENT TYPE:  INP   LOCATION:  5040                         FACILITY:  [facility]   PHYSICIAN:  Ileana Roup, M.D.  DATE OF BIRTH:  [birth year]   DATE OF ADMISSION:  08/13/2006  DATE OF DISCHARGE:  08/14/2006                               DISCHARGE SUMMARY   DISCHARGE DIAGNOSES:  1. Lower extremity swelling, especially at the left third toe, status      post amputation in July of 2007, with possible early osteomyelitis.  2. Diabetes mellitus type 2.  3. Hyperlipidemia.  4. Tobacco abuse.   DISCHARGE MEDICATIONS:  1. Glipizide 10 mg p.o. q.a.m. and 5 mg p.o. q.p.m.  2. Imipramine 50 mg p.o. q.h.s.  3. Augmentin 875/125 mg one p.o. b.i.d.  4. Vicodin 5/500, 1-2 tablets p.o. q.4-6h. p.r.n.  5. Lantus 20 units subcutaneous q.p.m.   CONDITION AT DISCHARGE:  Stable.  The patient was instructed to follow  up with Dr. Kathyrn Sheriff in the outpatient clinic as previously scheduled on  August 23, 2006 at 2:30 p.m..  The patient was also instructed to  follow up with Dr. [name], his orthopedic surgeon, within the next week.  The patient was admitted to rule out osteomyelitis.  Dr. Jerl Santos was  consulted and was not impressed that this was osteomyelitis.  He was  only discharged on Augmentin that was previously prescribed for him to  complete that course of medication.  No labs are necessary in follow-up.   PROCEDURES:  1. An x-ray of his left foot showed possible osteomyelitis involving      the distal third metatarsal at the prior amputation site, as well      as the second and fourth distal metatarsals and proximal phalanges      at the MTP joints.  Lower extremity Dopplers revealed the left      lower extremity had no evidence of DVT, superficial thrombosis or      Baker's cyst.  2. Left foot three-view x-ray on August 13, 2006 showed no      substantial interval change.  Features in the  second and third toes      suspicious for bony infection.  Bony erosion in the fourth      metatarsal was less apparent today.   CONSULTANTS:  Dr. Jerl Santos from orthopedics.   ADMISSION HISTORY AND PHYSICAL:  Please see the chart for full details.  In summary, [name] is a 60 year old African-American man with  diabetes mellitus type 2 who had his third toe from his left foot  amputated in July of 2007 who presents with left foot pain and swelling.  The patient initially presented to the emergency department on August 09, 2006 for the same complaints and was sent home with Augmentin and  Vicodin.  He returned as scheduled to the outpatient clinic on the day  of admission and had not noticed significant improvement in the pain or  swelling despite the initiation of antibiotics.  The patient had a  prolonged postoperative course since July of 2007 after his toe  was  amputated requiring multiple visits at the [facility].  His  most recent visit on July 19, 2005 showed complete resolution of the  wound, however, and he was discharged from their care.  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  He describes the ankle as about the size of a  grapefruit.  The pain occasionally radiates up his entire leg, but is  mostly centered on the ankle and foot.  He currently rates the pain as a  5/10.  He denied any fevers, chills, nausea, vomiting, diarrhea, chest  pain or shortness of breath.  His other foot has not experienced any  swelling, pain or lesions.  His recent blood sugars have been in the mid  100s with highs of 200s.   PHYSICAL EXAMINATION:  VITAL SIGNS:  On admission, his temperature was  97.5, blood pressure 136/77, pulse 127.  HEART:  Pertinent physical exam findings include he was tachycardiac,  regular, no murmurs were  appreciated.  ABDOMEN:  Benign.  LUNGS:  Clear.  He had markedly decreased sensation in the plantar  aspect of his bilateral feet.  The left foot was swollen to slightly  above the ankle.  It was painful to palpation over the third  metatarsophalangeal joint.  He had a deep crevice at the site of  amputation of the left third toe.  There was no drainage or erythema.  He had a superficial ulcer on the right big toe.  There was no  significant lymphadenopathy anywhere.   LABORATORIES ON ADMISSION:  Sodium was 136, potassium 4.1, chloride 104,  bicarbonate 24, BUN 14, creatinine 1.01, glucose 108, bilirubin 0.7,  alkaline phosphatase 104, AST 24, ALT 30, protein 7.3, albumin 3.9,  calcium 9.3, white blood cell count 7.2, hemoglobin 12, platelets 210.   HOSPITAL COURSE:  1. Lower extremity swelling encompassing the foot and ankle the left      lower extremity.  The patient had his left third toe amputated in      July of 2007.  He underwent extensive wound care for the 6 months      following the amputation secondary to complications and infections      from the surgery.  The patient was discharged from the wound care      center; however, approximately 1 month prior to admission.  After      the patient was discharged from the wound care center, he was able      to go back to work and had been working as a Corporate investment banker      for the 2 weeks preceding his admission.  He wears diabetic inserts      in construction boots which are too small to accommodate the      diabetic inserts.  Dr. Jerl Santos saw the patient on the day of      admission and felt that upon review of the patient's x-rays that an      MRI would not need be necessary.  The patient was very nontoxic in      appearance.  He had no systemic or local signs of infection.  Dr.      Jerl Santos did not think that the patient's x-rays revealed any signs     of osteomyelitis, especially given the cleanliness of the wound.  He  believed that the swelling was secondary to the patient having      returned to work wearing boots with diabetic inserts that were too      small to accommodate the inserts.  The patient was instructed to      purchase larger boots to accommodate his diabetic inserts and to      follow up as an outpatient with his regular orthopedic surgeon, Dr.      [name].  The patient was instructed just to complete a course of      Augmentin that he was given in the emergency department 2 days      prior to admission.  He was given instructions to take Vicodin for      pain management, as well.  He was also instructed that keeping his      diabetes under control will decrease his chances of this      progressing into osteomyelitis.  Because the patient's wound looks      so good and he was nontoxic appearing, it was felt safe to      discharge the patient home with close follow-up with his regular      physicians.  An MRI plus or minus surgery was not warranted nor      justified at this time.  2. Diabetes mellitus type 2.  The patient was discharged on the same      regimen with which he was admitted.  No changes have been made to      his medications.  3. Hyperlipidemia.  Again, this was not active during this      hospitalization.  However, a fasting lipid panel should be obtained      in the outpatient setting, as he is not currently listed as being      on anything for his hyperlipidemia.  4. Tobacco abuse.  The patient was counseled to stop smoking.  This      should be followed up as an outpatient.   DISCHARGE LABORATORIES AND VITAL SIGNS:  On the day of discharge no new  labs were obtained; however, blood cultures were negative and finalized.  Urine culture was negative and finalized, as well.  His  temperature was 97.6, pulses 93, blood pressure 112/63, and he was  saturating at 98% on room air.  His CBG on the day of discharge was 144.   PENDING LABORATORIES:  None.      [name], D.O.  Electronically Signed      Ileana Roup, M.D.  Electronically Signed    EA/MEDQ  D:  10/05/2006  T:  10/05/2006  Job:  [id number]   cc:   Outpatient Clinic  [name]. [name], M.D.

## 2010-11-28 NOTE — Assessment & Plan Note (Signed)
[facility]   NAME:  [name], WELTER            ACCOUNT NO.:  [id number]   MEDICAL RECORD NO.:  [id number]      DATE OF BIRTH:  01/24/1951   PHYSICIAN:  Theresia Majors. Tanda Rockers, M.D. VISIT DATE:  05/31/2006                                     OFFICE VISIT   VITAL SIGNS:  Blood pressure 134/82.  Respirations 18.  Pulse rate 76 and he  is afebrile.   PURPOSE OF TODAY'S VISIT:  [name] is a 60 year old man who we have  been treating with hyperbaric oxygen treatment for a Wagner grade 4 diabetic  foot ulcer involving the third toe of his left foot.  He reports that there  has been no drainage in the interim.  There has been no pain, no fever, and  no swelling.   WOUND EXAM:  Capillary blood glucose was 201 mg percent prior to his dive,  162 mg percent post-dive.  Inspection of the wound shows that there is  reepithelialization of the wound and no drainage.   DIAGNOSIS:  Resolved wound.   MANAGEMENT PLAN & GOAL:  We are discharging the patient from [facility].  We have  instructed him to continue to wear his Darco healing sandal with the off-  loading felt strips.  He is to continue to pursue the fitting and wearing of  his custom inserts by [employer].  We will reevaluate the patient in 2 weeks.   We have counseled the patient regarding the propensity for recurrence,  especially if off-loading is not maintained.  We have encouraged the patient  strongly to keep his appointments with his internal medicine doctor for the  management of his diabetes.  We have given him an opportunity to ask  questions.  The patient seems to appreciate the degree of the instructions  and indicates that he will be compliant as per above.           ______________________________  Theresia Majors. Tanda Rockers, M.D.     [name]  D:  05/31/2006  T:  05/31/2006  Job:  [id number]   cc:   [facility]

## 2010-11-28 NOTE — Assessment & Plan Note (Signed)
[facility]   NAME:  Patrick, Farrell            ACCOUNT NO.:  [id number]   MEDICAL RECORD NO.:  [id number]      DATE OF BIRTH:  07-19-1950   PHYSICIAN:  Theresia Majors. Tanda Rockers, M.D. VISIT DATE:  11/11/2006                                   OFFICE VISIT   SUBJECTIVE:  [name] is a 60 year old man who is seen in follow up  complaining of blood tinge on his right sock.  He noticed this this  morning.  He has recently changed his shoes 3 days ago.  There has been  no fever, no malodor.  His blood sugars have been under reasonable  control.   OBJECTIVE:  Blood pressure is 120/70, respirations 18, pulse rate 73,  temperature 97.9.  Inspection of the lower extremity shows that there is  a thick callous with subdermal hemorrhage and ulceration on the distal  phalanx of the hallux of the right foot.  A full-thickness debridement  was performed disclosing healthy-appearing granulation tissue and no  extension into the bone or tendons.  There is no evidence of ascended  infection.  The pedal pulse is readily palpable.  There is no ulceration  on the left foot.  The toenails are overgrown.  They were trimmed with  the nail beds of the hallux involved in what appeared to be  onychomycosis, which required debridement as well as trimming.   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   PLAN:  We will start the patient on Iodosorb gel and antiseptic soap  washings once a day, thorough drying of the foot, and routine diabetic  foot care.  As a dressing he is to use a clean cotton sock, and he has  been advised to change socks twice daily.  He has in his possession new  custom shoes which appear to be adequate.  We have advised the patient  to wear only these shoes.  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  We re-  emphasized the importance of adequate footwear and protection.  We have  clearly defined him at risk for major amputation if he does not exercise  extreme care of his foot.  The patient seems to understand.  We have  instructed him in daily inspection of his foot.  We have given him an  opportunity to ask questions.  He seems to understand and indicates that  he will be compliant.  We will re-evaluate him in 1 week p.r.n.      Harold A. Tanda Rockers, M.D.  Electronically Signed     HAN/MEDQ  D:  11/11/2006  T:  11/11/2006  Job:  [id number]

## 2010-11-28 NOTE — Assessment & Plan Note (Signed)
[facility]   NAME:  [name], [name]            ACCOUNT NO.:  [id number]   MEDICAL RECORD NO.:  [id number]      DATE OF BIRTH:  [birth year]   PHYSICIAN:  Theresia Majors. Tanda Rockers, M.D. VISIT DATE:  03/04/2006                                     OFFICE VISIT   VITAL SIGNS:  Blood pressure 100/80, respirations 20, pulse rate 68 and he  is 98.1 orally.   PURPOSE OF TODAY'S VISIT:  [name] is a 60 year old man who was treated  in-house with a Wegener grade IV diabetic foot ulcer.  In the interim, he  has had a wound vac placed by the home health nurse and he continues to this  date.  In the interim, he has had significant drainage which has been  contained in the wound vac.  He denies fever.  He has had an interim  evaluation with his primary care physician for management of concurrent  diabetes as well as renal insufficiency.   WOUND EXAM:  Inspection of the wound on the left foot shows that the Ray  amputation has healthy granulation at the periphery.  At the base, there is  denuded cartilage from the head of the metatarsal.  There are areas of  nonviable tissue which were debrided off with a rongeur.  Portions of those  debridement included the entire articular surface of the metatarsal head.  Hemorrhage was controlled with digital pressure and a moist-moist packing.   DIAGNOSIS:  Adequately debrided Wegener grade IV wound.   MANAGEMENT PLAN & GOAL:  We have referred the patient to the financial  counselor for assistance in arranging for payment of his hyperbaric oxygen  treatment.  In the meantime, he will be placed on the waiting list for  hyperbarics as are all of the chambers are currently being utilized.  We  will reevaluate the patient in two weeks.           ______________________________  Theresia Majors Tanda Rockers, M.D.     [name]  D:  03/04/2006  T:  03/05/2006  Job:  [id number]

## 2010-11-28 NOTE — Assessment & Plan Note (Signed)
[facility]   NAME:  [name], [name]            ACCOUNT NO.:  [id number]   MEDICAL RECORD NO.:  [id number]      DATE OF BIRTH:  Jul 31, 1950   PHYSICIAN:  Theresia Majors. Tanda Rockers, M.D. VISIT DATE:  02/15/2006                                     OFFICE VISIT   WOUND CARE CONSULTATION NOTE   DATE OF VISIT:  February 15, 2006.   REASON FOR CONSULTATION:  [name] is a 60 year old recently diagnosed  diabetic who was referred to the Wound Center in continuity from an  inpatient management at [facility].   IMPRESSION:  [name] diabetic foot ulcer.   RECOMMENDATIONS:  Proceed with HBO therapy.  The patient and his wife have  been counseled regarding the indications for HBO therapy, i.e. a deep  diabetic foot infection with positive cultures.  He has been treated  effectively for approximately 4 weeks with antibiotic therapy,  debridement's, off-loading, and surgery.  The wound remains open.  There is  no evidence of ongoing sepsis at this point.  There is exposed tendon as  well as bone in the wound.   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  He presented to the emergency room at [facility] for evaluation of a draining ulcer on his foot and was subsequently  diagnosed as having type 2 diabetes and a complication, i.e. a foot  infection.  He was subsequently taken to surgery, had a ray amputation,  radical debridement, was treated open in [facility].  At that point he was  evaluated by the wound service and we concurred that he be started on wound  vac therapy which he has had placed.  He is discharged from the hospital and  given p.o. Augmentin and Tylox for pain as well as Glipizide as part of his  management of his diabetes.  He returns in followup to the Wound Center to  arrange for his hyperbaric treatment.   PAST MEDICAL HISTORY:  Remarkable for no chronic illnesses.  He has  not been  under a physician's care until recently.   ALLERGIES:  He denies allergies.   HABITS:  He did smoke and drink socially.   FAMILY HISTORY:  Positive for diabetes.  Negative for heart attacks,  strokes.   SOCIAL HISTORY:  He is married. He has no children with his current wife.   REVIEW OF SYSTEMS:  Discloses that he denies angina pectoris, significant  weight loss or gain.  He denies polyphagia, polydipsia.  The remainder of  the review of systems is negative.   PHYSICAL EXAMINATION:  GENERAL APPEARANCE:  He is alert, oriented and in no  acute distress.  VITAL SIGNS:  Stable.  He is afebrile.  HEENT:  Examination is clear. His exam showed no evidence of barotrauma or  previous ear surgery.  He was able to equalize atmospheric and otologic  pressures without difficulty.  NECK:  Supple.  Trachea is midline.  Thyroid is nonpalpable.  LUNGS:  Clear.  HEART:  Sounds normal.  ABDOMEN:  Soft.  EXTREMITIES:  Examination is remarkable for bound pedal pulses.  There is a  wedge resection of the third digit down to the  head of the metatarsal.  There is healthy appearing granulation tissue as well as a tendon in the  head of the metatarsal exposed in the wound.  There is some moisture but  this is not particularly malodorous.  The patient is essentially insensate  in this foot.   LABORATORY DATA:  Review of his laboratory from [facility] showed that his  hemoglobin and hematocrit were essentially normal.  His creatinine upon  admission was initially 3.1and on last recorded exam it as 2.9.   PLAN:  We have referred this patient to the site director to confirm his  financial qualifications for proceeding with HBO at this point.  We have  explained to the patient and his wife the possible complications of HBO  therapy including barotrauma, claustrophobia, oxygen toxicity.  They seem to  understand.  They wish to proceed with HBO therapy.            ______________________________  Theresia Majors. Tanda Rockers, M.D.     [name]  D:  02/15/2006  T:  02/15/2006  Job:  [id number]

## 2010-11-28 NOTE — Assessment & Plan Note (Signed)
[facility]   NAME:  [name], [name]            ACCOUNT NO.:  [id number]   MEDICAL RECORD NO.:  [id number]      DATE OF BIRTH:  1950-11-18   PHYSICIAN:  Theresia Majors. Tanda Rockers, M.D. VISIT DATE:  08/25/2006                                   OFFICE VISIT   VITAL SIGNS:  Blood pressure is 121/80, respirations 20, pulse rate 113,  temperature is 97.5.  Capillary blood glucose is 145 mg percent.   PURPOSE OF TODAY'S VISIT:  [name] returns for follow up of a  recurrent ulceration involving the right hallux.  Historically, the  patient was treated in the wound center for a Wagner grade 4 diabetic  foot ulcer and underwent hyperbaric oxygen treatment, serial  debridements with great success.  He was fitted in custom footwear and  discharged.   He has returned to work, but he has not been wearing his custom  footwear, but returned to wearing proprietary-brand work shoes.  He  noted an ulceration on his toe several days ago, was evaluated by the  primary care physician, and referred to the wound center.  He denies  interim fever, malodorous drainage, or exudate.   WOUND EXAM:  Inspection of the right great toe shows that there is a  full-thickness bulla with hemorrhage and leakage extending down into the  subcutaneous tissue.  A full-thickness debridement was performed of this  area.  The debridement extended down to the subcutaneous tissue.  There  was a questionable amount of purulence which was cultured.   WOUND SINCE LAST VISIT:   CHANGE IN INTERVAL MEDICAL HISTORY:   DIAGNOSIS:   TREATMENT:   ANESTHETIC USED:   TISSUE DEBRIDED:   LEVEL:   CHANGE IN MEDS:   COMPRESSION BANDAGE:   OTHER:   MANAGEMENT PLAN & GOAL:  Assessment:  A Wagner grade 2 diabetic foot  ulcer.   Plan:  We have instructed the patient to discard all of his shoes with  the exception of the custom shoes and inserts which he has been fitted  for.  We have advised him to use  antiseptic soap to wash his foot twice  a day, apply topical Neosporin, and wear a white cotton sock as the  dressing.  We have given him a prescription also for [employer] to have  extra depth/custom work shoes and inserts fashioned.  We have emphasized  that his inconsistent upon wearing his work shoes could result in  amputation of his leg.  The patient seems to understand and indicates  that he will be compliant.  He expressed gratitude for having been seen  in the clinic.           ______________________________  Theresia Majors. Tanda Rockers, M.D.     [name]  D:  08/25/2006  T:  08/26/2006  Job:  [id number]   cc:   [facility]

## 2010-11-28 NOTE — Assessment & Plan Note (Signed)
[facility]   NAME:  [name], [name]            ACCOUNT NO.:  [id number]   MEDICAL RECORD NO.:  [id number]      DATE OF BIRTH:  [birth year]   PHYSICIAN:  Theresia Majors. Tanda Rockers, M.D. VISIT DATE:  04/13/2006                                     OFFICE VISIT   VITAL SIGNS:  Blood pressure 118/72, respirations 18, pulse rate 72.  He is  afebrile.   SUBJECTIVE:  [name] is a 60 year old man who was seen in followup for  Wagner grade 4 diabetic foot ulcer involving his third toe of the left foot.  In the interim, he has had a wound VAC placed.  He denies fever.  He  continues on glipizide 10 mg q.a.c. and NovoLog insulin 4 units on a sliding  scale.  His antibiotic is amoxicillin and clavulanic acid 500/125 b.i.d.  He  has been followed by the [facility].   OBJECTIVE:  Inspection of the wound shows that there is hypergranulation  with extreme friability.  A Q-tip was used, and it passes into the midfoot.  The cavity itself has contracted.  The edema is decreased somewhat.   ASSESSMENT:  Wagner grade 4 diabetic foot ulcer.   RECOMMENDATIONS:  We have recommended that we proceed with hyperbaric oxygen  treatment with a continuation of his antibiotics.  His wound has been  cultured.  We placed an iodoform Nu Gauze packing into the wound.  We will  leave it for 24-48 hours, and we will remove this packing.   We have explained the indications for hyperbaric oxygen in the face of a  Wagner grade 4 ulceration to the patient in terms that he seems to  understand.  We will have him speak with the financial counselor to arrange  for his coverage, and we will make available to him the next hyperbaric  oxygen chamber that comes available.   MANAGEMENT PLAN & GOAL:           ______________________________  Theresia Majors. Tanda Rockers, M.D.     [name]  D:  04/13/2006  T:  04/14/2006  Job:  [id number]   cc:   [facility]

## 2010-11-28 NOTE — Assessment & Plan Note (Signed)
[facility]   NAME:  [name], [name]            ACCOUNT NO.:  [id number]   MEDICAL RECORD NO.:  [id number]      DATE OF BIRTH:  11/01/1950   PHYSICIAN:  Theresia Majors. Tanda Rockers, M.D.      VISIT DATE:                                     OFFICE VISIT   SUBJECTIVE:  [name] is a 60 year old man, who has completed his 16th  dive of HBO for the treatment of a Wagoner grade 4 diabetic foot ulcer.  In  the interim he reports that there has been closing of the wound, there has  been less drainage, no pain, and no fever.   OBJECTIVE:  His vital signs are stable.  He is afebrile.  The inspection of  the wound shows that there is in fact decrease in the sinus tract.  There is  no hyperemia and this edema is near non-existent.  The most recent culture  from May 04, 2006 showed a few methicillin-resistant Staph.  The patient  has been on Septra and doxycycline for which this organism is sensitive.   ASSESSMENT:  Improved wound.   PLAN:  We will continue his hyperbaric oxygen treatment in conjunction with  his methicillin-resistant Staph being managed with Septra and doxycycline.  The wound packing using a 1/4 inch iodoform gauze will be continued.  We  will reevaluate the patient in 1 week.           ______________________________  Theresia Majors. Tanda Rockers, M.D.     [name]  D:  05/11/2006  T:  05/11/2006  Job:  [id number]

## 2010-11-28 NOTE — Discharge Summary (Signed)
[name], [name]            ACCOUNT NO.:  [id number]   MEDICAL RECORD NO.:  [id number]          PATIENT TYPE:  INP   LOCATION:  5040                         FACILITY:  [facility]   PHYSICIAN:  Ileana Roup, M.D.  DATE OF BIRTH:  19-Nov-1950   DATE OF ADMISSION:  08/13/2006  DATE OF DISCHARGE:  08/14/2006                               DISCHARGE SUMMARY   DISCHARGE DIAGNOSES:  Include:  1. Left foot pain, with X-ray findings concerning for osteomyelitis of      the left foot.  2. Type 2 diabetes mellitus.   The patient will be discharged on the following medications:  1. Glipizide 10 mg take 1 tablet each morning.  2. Glipizide 5 mg 1 tablet each evening.  3. Imipramine 50 mg 1 tablet each evening.  4. Augmentin 875/125 mg take 1 tablet each morning and evening until      gone.  5. Vicodin 5/500 take 1-2 tablets every 4-6 hours as needed for pain,      #30 prescribed.  6. Lantus 20 units injected subcutaneously each evening.   DISPOSITION:  He will be asked to follow up with [name] in the outpatient clinic  on February 11 at 2:30 and he will call to make a followup appointment  with Dr. [name] within the next week.   PROCEDURES:  While in hospital, the patient underwent a 3-view plain X-ray series of  the left foot which showed no substantial interval change from the  previous study dated August 09, 2006, with cortical erosion at the  distal aspect of the middle metatarsal suspicious for bony infection;  the MTP joint of the second toe showed evidence of osseous erosion, and  joint/bony infection could not be excluded radiographically; the erosive  change at the head of the fourth metatarsal was less apparent.   CONSULTS:  A consult was obtained during this stay from Dr. Marcene Corning of  [facility].   ADMISSION H&P:  This is a 60 year old African American male with a  history of diabetes type 2, status post third toe amputation, who  presented with  left foot pain and swelling of 2 weeks' duration.  He  began working 2 weeks prior to admission and since that time had noted  significant swelling and pain of his foot.  He presented to the ED on  January 28, and X-rays of the left foot showed evidence of  osteomyelitis; he was sent home with Augmentin and Vicodin.  He  presented to the clinic for followup appointment on Friday and was  admitted due to suspicion of active osteomyelitis.  Patient had no  active ulcer or skin breakdown on his left foot.   ADMISSION VITAL SIGNS AND LABS:  Upon admission, the patient was afebrile with a temperature of 97.5,  blood pressure 136/77, pulse is 127.  CBC was obtained, at the time of  admission, revealing a white blood cell count of 7.2, an H&H of 12.0 and  36.1 and platelet count of 210.  A CMET, obtained at the time of  admission, was unremarkable, as was a UA.   HOSPITAL  COURSE:  The patient was admitted for suspected osteomyelitis; an orthopedic  surgery consult was obtained, and repeat X-rays of his foot were  obtained with results as above.  The patient noticed significant  improvement of his pain during the hospital stay.  Orthopedic consultant  Dr. Jerl Santos felt that the X-ray findings were unlikely to represent  osteomyelitis, and also felt that patient's recent foot pain was due to  his having returned to work with improper shoes.  Although he observed  that the findings could possibly represent osteomyelitis, in the absence  of any skin breakdown or medical compromise, he did not feel that  surgery or antibiotic therapy were justified.  He did not feel that MRI  imaging was needed, and he advised that patient wear appropriate  diabetic footwear at work.  Blood cultures and a urine culture were  obtained and the results of these were pending at discharge, and  returned negative on final reading.  At discharge, no labs were repeated  because they had only recently been drawn less than  24 hours previously.  The patient was discharged and instructed to complete the Augmentin  given to him in the emergency department in order to cover any component  of soft tissue infection; he was also advised to obtain shoes that  better accommodate his diabetic inserts.  He will be asked to follow up  with [name] in the outpatient clinic on February 11 at 2:30 and he  will call to make a followup appointment with Dr. [name] within the next  week.  At the time of discharge, the patient's pain was significantly  improved, as was his swelling.  He was made aware of the importance of  followup, as well as completing daily diabetic foot care.   Dictated by Lanice Shirts, MSIV      [name], D.O.       Ileana Roup, M.D.  Electronically Signed    EA/MEDQ  D:  08/14/2006  T:  08/15/2006  Job:  [id number]   cc:   [name]. [name], M.D.  [name], M.D.

## 2010-11-28 NOTE — Assessment & Plan Note (Signed)
[facility]   NAME:  [name], [name]            ACCOUNT NO.:  [id number]   MEDICAL RECORD NO.:  [id number]      DATE OF BIRTH:  September 26, 1950   PHYSICIAN:  Theresia Majors. Tanda Rockers, M.D.      VISIT DATE:                                     OFFICE VISIT   SUBJECTIVE:  [name] is a 60 year old man, who is undergoing hyperbaric  oxygen treatment and serial wound debridements for a Wagoner grade 4  ulceration of his left foot.  In the interim he denies fever, there has been  decrease in his swelling and there has been essentially no drainage.   OBJECTIVE:  VITAL SIGNS:  Blood pressure is 140/92, respirations are 14,  pulse rate is 80, and he is afebrile.  Inspection of the wound shows that there is indeed a significant decrease in  the previous edema.  The wound itself has granulated nicely and sealed from  the bottom toward the surface.  There is a scant opening with a healthy  appearing granulation.  There is no evidence of inflammation or pain.  His  last culture taken on May 11, 2006 showed abundant diphtheroids.  The  patient completed his antibiotic course on the May 17, 2006.   ASSESSMENT:  Significant improvement in a Wagoner grade 4 wound with near-  healing accomplished as a result of combined therapies.   PLAN:  We will continue the patient in the healing sandal with felt strip  off-loading of the metatarsal heads.  We have given him a prescription for  custom inserts and extra depth shoes.  He is to proceed with procuring the  foot wear immediately.  In the interim we will continue his HBO treatment.  We are placing a moist, moist dressing onto the wound in anticipation of  closure within the next 5-10 treatments.           ______________________________  Theresia Majors. Tanda Rockers, M.D.     [name]  D:  05/18/2006  T:  05/18/2006  Job:  [id number]

## 2010-11-28 NOTE — Assessment & Plan Note (Signed)
[facility]   NAME:  [name], [name] NO.:  [id number]   MEDICAL RECORD NO.:  [id number]      DATE OF BIRTH:  1950-10-31   PHYSICIAN:  Maxwell Caul, M.D.      VISIT DATE:                                   OFFICE VISIT   PURPOSE OF TODAY'S VISIT:  Followup of his Wagner's grade IV diabetic  foot ulcer in the webspace between his third toe and his second toe.  He  has completed hyperbaric oxygen.  He has had no fever, pain, or  swelling.  He has remained in a healing sandal.  His appointment to  obtain his diabetic shoes is not until June 17, 2006   WOUND EXAM:  The wound in the webspace between the toes is totally  resolved.  I inspected this under light, there is no wound.   TISSUE DEBRIDEMENT:  I did do a partial-thickness debridement of some  callus at the base of his second toe; once again underneath this there  is no open wound which is indeed fortunate.   DIAGNOSIS:  Resolved Wagner Grade IV wound.   MANAGEMENT PLAN & GOAL:  We will see him one time after he obtains his  diabetic footwear.  After that he can be safely discharged from the  clinic.           ______________________________  Maxwell Caul, M.D.     MGR/MEDQ  D:  06/14/2006  T:  06/14/2006  Job:  [id number]

## 2010-11-28 NOTE — Assessment & Plan Note (Signed)
[facility]   NAME:  [name], [name]            ACCOUNT NO.:  [id number]   MEDICAL RECORD NO.:  [id number]      DATE OF BIRTH:  [birth year]   PHYSICIAN:  Theresia Majors. Tanda Rockers, M.D. VISIT DATE:  09/01/2006                                   OFFICE VISIT   VITAL SIGNS:  His blood pressure is 140/84, respirations 18, pulse rate  90, temperature 97.7.  Capillary glucose is 122 mg%.   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  His initial  evaluation disclosed that he had not been wearing his custom orthotics  and we instructed him to resume wearing the custom orthotics with local  diabetic foot care.  He returns for followup.  He denies excessive  drainage, malodor, pain, or fever.   WOUND EXAM:  Inspection of the leg shows that there is bilateral 2+  edema with chronic changes of stasis.  The ulcer itself has contracted  with a 100% bed of healthy appearing granulation.  There is no drainage,  no malodor.   MANAGEMENT PLAN & GOAL:  Clinical response to resumption of wearing his  custom orthotics.   We have reinforced and encouraged the patient to continue the use of his  custom orthotics with white socks, antiseptic washing of his feet twice  a day, changing the socks twice a day, keeping the feet dry.  We  anticipate that this wound should heal without difficulty.  In addition,  we have encouraged him to keep his medical appointments with Dr. Kathyrn Sheriff  at the [facility].  We have given the patient  the opportunity to ask questions.  He seems to understand and indicates  that he will be compliant as per above.           ______________________________  Theresia Majors. Tanda Rockers, M.D.     [name]  D:  09/01/2006  T:  09/02/2006  Job:  [id number]   cc:   [name], M.D.

## 2010-11-28 NOTE — Assessment & Plan Note (Signed)
[facility]   NAME:  [name], [name]            ACCOUNT NO.:  [id number]   MEDICAL RECORD NO.:  [id number]      DATE OF BIRTH:  03/02/1951   PHYSICIAN:  Theresia Majors. Tanda Rockers, M.D. VISIT DATE:  04/15/2006                                     OFFICE VISIT   VITAL SIGNS:  Blood pressure is 132/80.  Respirations 18.  Pulse rate is 69.  He is afebrile.   PURPOSE OF TODAY'S VISIT:  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  Since the interim, he has had  a Nugauze packing in place.  He returns for removal of the packing.  He  denies interim fever or excruciating pain.  He has been experiencing some  discomfort.   WOUND EXAM:  Inspection of the left foot shows that there is persistent  edema.  The Q-tip is used to probe the area of ulceration at the ray  resection of the 3rd digit.  There is purulence expressed from the wound.  An interim culture has shown white cells but rare staph.  No sensitivities  are available at this time.   WOUND SINCE LAST VISIT:   CHANGE IN INTERVAL MEDICAL HISTORY:   DIAGNOSIS:  Wagner grade 4 ulceration.   TREATMENT:   ANESTHETIC USED:   TISSUE DEBRIDED:   LEVEL:   CHANGE IN MEDS:   COMPRESSION BANDAGE:   OTHER:   MANAGEMENT PLAN & GOAL:  We recommended that we proceed with hyperbaric  oxygen treatment to begin on August 8 at 8 a.m. We are scheduling the  patient to dive 2 atmospheres 100% oxygen for 90 minutes.  We are  prescribing 30 treatments.  His lab has been reviewed and includes a chest x-  ray that shows no evidence of bullous disease.  The date of the x-ray was  January 31, 2006.  A cardiogram on the same day showed a sinus tachycardia with  no acute changes.  A BMET on February 09, 2006 showed a BUN of 21, creatinine of  2.4 with normal sodium and potassium.  The indications for hyperbaric oxygen  treatment would be a diabetic foot ulcer, Wagner grade 4.   We have  counseled the patient regarding the indications for hyperbaric  oxygen treatment, i.e. a Wagner grade 4 diabetic foot ulcer refractory to  the original forms of treatment including wound vac and off loading and  antibiotics.  We have also discussed the possible complications,  specifically baric trauma, claustrophobia and oxygen toxicity with the  patient in terms that he seems to understand. We have given him an  opportunity to ask questions.  He is comfortable with the decision and  wishes to go ahead with hyperbaric oxygen treatment in an effort to resolve  his diabetic foot ulcer.  He gives his verbal informed consent to proceed as  outlined.   ADDENDUM:  The patient will continue on Keflex 500 mg p.o. daily.           ______________________________  Theresia Majors Tanda Rockers, M.D.     [name]  D:  04/15/2006  T:  04/16/2006  Job:  [id number]   cc:   [name], M.D.

## 2010-11-28 NOTE — Consult Note (Signed)
NAMESYLAS, TWOMBLY            ACCOUNT NO.:  [id number]   MEDICAL RECORD NO.:  [id number]          PATIENT TYPE:  INP   LOCATION:  5021                         FACILITY:  [facility]   PHYSICIAN:  Theresia Majors. Tanda Rockers, M.D.DATE OF BIRTH:  [birth year]   DATE OF CONSULTATION:  DATE OF DISCHARGE:                                   CONSULTATION   DATE OF CONSULTATION:  February 05, 2006   REASON FOR CONSULTATION:  Mr. Ellner is a 60 year old man who has been  admitted the Internal Medicine Teaching Service B, Dr. Harriett Sine Phifer,  attending.  Consult has been called requesting assistance in the management  of a diabetic foot infection.   IMPRESSION:  Wagner 4 diabetic foot ulceration.   RECOMMENDATION:  Proceed with open management to include wound vac,  intravenous antibiotics, serial debridements, and if wound is persistent  (which I suspect it most definitely will be), we will progress to a course  of hyperbaric oxygen to include a total of 30 dives at 2.4 atmospheres.  This management can be administered as an outpatient through the [facility].   SUBJECTIVE:  Mr. Cassetta is a 60 year old man who was admitted on January 21, 2006, with an abscess in the midportion of his distal left foot.  The  patient was subsequently taken to surgery by Dr. 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  He has since been  managed with open drainage intravenous antibiotics.  Associated  comorbidities include the recent diagnosis of diabetes mellitus, type II and  acute/chronic renal insufficiency. His past medical history is remarkable  for no chronic illnesses.  He had not been under physician's care.  He  denies allergies.  He does smoke and drink socially.  His family history is  positive for diabetes, negative for heart attacks and stroke.  Socially, he  is married.  He has no  children with his current wife.  He is employed in  [employer].  He chose not to carry health insurance because he was insured  by his wife.   REVIEW OF SYSTEMS:  The review of systems discloses that he denies angina  pectoris, hemoptysis, recent weight loss or gain.  His weight has been  stable.  He has some equivocal history of polydipsia, polyphagia.  He denies  visual changes.  The remainder of the review of systems is negative.   PHYSICAL EXAMINATION:  GENERAL:  He is alert, oriented, in good contact with  reality.  Responded appropriately to questioning.  VITALS:  His temperature is 97, blood pressure is 110/54, pulse rate of 80,  respirations are 20.  HEENT EXAM:  Is remarkable for muddy sclerae, trachea is midline.  Thyroid  is nonpalpable.  LUNGS:  Are clear.  HEART SOUNDS:  Are normal.  ABDOMEN:  Is soft.  Femoral pulses are 3+, dorsalis pedis pulses 3+ bilaterally.  LEFT LOWER EXTREMITY:  Is remarkable for 2+ edema with acute changes of  stasis, including desquamation.  He has a recent surgical dressing, which  when taken down disclosed a ray amputation of the third digit down to the  articular cartilage of the third metatarsal.  The wound is not particularly  malodorous.  The dressing has a brownish serous tint to the exudate.  NEUROLOGICALLY:  There is decreased sensation.  RIGHT LOWER EXTREMITY:  Is remarkable for the absence of ulcerations and a  bounding 3+ pulse.  There is no regional adenopathy.   REVIEW OF HIS LABORATORY:  Shows a nonacute chest x-ray.  Cardiogram shows a  sinus rhythm.  The hemoglobin and hematocrit are low normals.  BUN is  elevated.  Creatinine initially was 3.1, and his last exam was 2.9.  The  electrolytes are within normal limits.   DISCUSSION:  The patient has been advised that his wound extends down to the  midportion of the foot, and we would recommend that hyperbaric oxygen  treatment be administered in this clinical setting.  We have  briefly  outlined the indications for hyperbaric oxygen therapy, i.e., a Wagner grade  3 ulcer of the foot or better that has been refractory to operative  debridement, antibiotics, and off-loading.  The patient's admission date  suggests that he will have met this criteria within the next 10 days.  I  would recommend that the wound vac which has been ordered for him be  applied, that he be discharged with followup by the home health nurse to be  reevaluated in the [facility] as an outpatient one to  two weeks after his discharge.  He can continue his intravenous antibiotics  as an outpatient also, or he can be given a transition to an oral  preparation.   We have discussed the potential for major limb loss, including a  transmetatarsal amputation, a below-the-knee amputation if the infectious  process is not brought under control and/or if there is deterioration of his  renal function, which would decrease his ability to fight off infection and  to progress the primary healing.  Patient seems to understand the gravity of  the situation, and indicates the desire to accept the recommendations for  hyperbaric oxygen treatment at the [facility].           ______________________________  Theresia Majors. Tanda Rockers, M.D.     [name]  D:  02/05/2006  T:  02/06/2006  Job:  [id number]   cc:   Alvester Morin, M.D.  Fax: [phone]   [name]. [name], M.D.  Fax: [phone]   [facility]

## 2010-11-28 NOTE — Assessment & Plan Note (Signed)
[facility]   NAME:  [name]            ACCOUNT NO.:  [id number]   MEDICAL RECORD NO.:  [id number]      DATE OF BIRTH:  12/21/1950   PHYSICIAN:  Theresia Majors. Tanda Rockers, M.D. VISIT DATE:  03/18/2006                                     OFFICE VISIT   SUBJECTIVE:  [name] returns for a follow up of a Wagner grade 4 ulcer  of the left foot.  The patient has been treated with the wound VAC per [facility].  He denies interim fever but reports that there has been increased swelling,  warmth, and redness of his foot.  He has seen his primary care physician in  the interim and is scheduled to see the primary care physician again  tomorrow.   OBJECTIVE:  Blood pressure is 104/70, respirations 20, pulse rate 78, and he  is afebrile.  His capillary blood glucose is 147 mg/%.  Inspection of the  left foot shows that there is 3+ edema and warmth extending up to the ankle.  The wound itself is covered 100% with what appears to be healthy  granulation.  A Q-tip was extended to the inside of the wound, and it  discloses very friable and loose tissue.  Portions of these areas were  debrided with a curette.  Hemorrhage was controlled with direct pressure.   ASSESSMENT:  Probable persistent colonization/infection.   PLAN:  We have encouraged the patient to make arrangements with the hospital  to proceed with hyperbaric oxygen treatment and in the meantime to continue  the wound VAC and the antibiotics as suggested by his primary care  physician.  The patient has been advised that his condition is extremely  serious and could result in major limb loss.  He seems to understand, and we  will proceed as suggested.           ______________________________  Theresia Majors. Tanda Rockers, M.D.     [name]  D:  03/18/2006  T:  03/18/2006  Job:  [id number]

## 2010-11-28 NOTE — Assessment & Plan Note (Signed)
[facility]   NAME:  [name], HASKIN            ACCOUNT NO.:  [id number]   MEDICAL RECORD NO.:  [id number]      DATE OF BIRTH:  04/12/1951   PHYSICIAN:  Theresia Majors. Tanda Rockers, M.D.      VISIT DATE:                                     OFFICE VISIT   SUBJECTIVE:  Mr. Ermis is a 60 year old man currently undergoing  hyperbaric oxygen treatment for a Wagner grade 4 diabetic foot ulcer  involving his left third toe.  This wound has been treated with HBOC therapy  in the past.  He denies interim drainage, pain, or fever.  He continues on  doxycycline and Septra DS.   OBJECTIVE:  VITAL SIGNS:  His blood pressure is 122/86, respirations 18,  pulse rate 64, and he is afebrile.  Capillary blood glucose is 206 mg% prior  to his HBO, and 160 mg% at the end of his eleventh dive.  Inspection of the  foot shows that the edema is markedly decreased.  There is evidence of  granulation around the bed of the previously resected third toe.  Areas of  desquamated and necrotic tissue were sharply debrided with a wound curette.  There was a brisk bleeding, which was controlled with direct pressure.  Thereafter, the wound was irrigated with saline and a single quarter-inch  strip of new gauze was placed into the depth of the wound.   ASSESSMENT:  Clinical improvement response to hyperbaric oxygen.   PLAN:  We have recultured the wound in the interim.  We will continue him on  his antibiotics.  We will continue his HBO and reevaluate his wound in 5  days.           ______________________________  Theresia Majors Tanda Rockers, M.D.     [name]  D:  05/04/2006  T:  05/04/2006  Job:  [id number]

## 2010-11-28 NOTE — Assessment & Plan Note (Signed)
[facility]   NAME:  [name], [name] NO.:  [id number]   MEDICAL RECORD NO.:  [id number]      DATE OF BIRTH:  [birth year]   PHYSICIAN:  Maxwell Caul, M.D.      VISIT DATE:                                     OFFICE VISIT   PURPOSE OF TODAY'S VISIT:  Review of patient status post hyperbaric oxygen  treatment for a Wagoner's grade 4 ulceration of the left foot.  There is no  fever and there has been essentially no drainage.   WOUND EXAM:  The wound has continued to improve by review of previous notes.  There is granulation and epithelization.  The opening is really closed down  appreciably.  There is no pain.   IMPRESSION:  Significant improvement in this previously Wagoner's grade 4  wound.   MANAGEMENT PLAN & GOAL:  He is to continue with healing sandal with a felt  strip off-loading.  He is to continue with hyperbaric oxygen.  He tells me  that he will not be able to get his custom inserts and extra-depth shoes,  his appointment is not until June 17, 2006 for fitting.           ______________________________  Maxwell Caul, M.D.     MGR/MEDQ  D:  05/28/2006  T:  05/29/2006  Job:  [id number]

## 2010-12-01 ENCOUNTER — Encounter: Payer: Self-pay | Admitting: Internal Medicine

## 2010-12-01 NOTE — Assessment & Plan Note (Unsigned)
[facility]  NAME:  [name], [name]            ACCOUNT NO.:  [id number]  MEDICAL RECORD NO.:  [id number]      DATE OF BIRTH:  08/17/1950  PHYSICIAN:  Leonie Man, M.D.    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  SUBJECTIVE:  [name] is a large 60 year old man, recently discharged from the [facility] following treatment for his left plantar diabetic foot ulcer.  Upon discharge, he was given instructions and prescription for offloading diabetic foot wear.  He has not been able to obtain this to date and continues to wear his usual shows.  He returns now with a recurrence of his plantar foot ulcer.  OBJECTIVE:  The patient is afebrile and nontoxic.  The forefoot ulcer measures 0.4 x 0.2 x 0.9 cm.  The wound edges are calloused and macerated.  There has been necrotic base.  They are some small older, but no significant drainage of this wound.  PROCEDURE:  Excisional debridement of a callus and maceration and necrotic wound base.  The debridement is carried down to the tendons of the left foot.  Post debridement measurement shows the wound now to be 2.5 x 2.5 x 1.0 cm.  ASSESSMENT AND PLAN:  Recurrent diabetic foot ulcer, Wagner III, rule out osteomyelitis.  We will go ahead and get an x-ray of his left foot. The wound is packed today with silver collagen along with Santyl and Medihoney.  The patient is then placed in padded dressing, Kerlix, Coban, placed an offloading shoe.  We will follow up with him in approximately 3 days to place him in a total contact cast (TCC).  We will have to consider whether or not we will be able to treat him by Dermagraft within 1 week, MD followup in 1 week.     Leonie Man, M.D.     PB/MEDQ  D:  11/30/2010  T:  12/01/2010  Job:  [id number]

## 2010-12-02 ENCOUNTER — Other Ambulatory Visit (HOSPITAL_BASED_OUTPATIENT_CLINIC_OR_DEPARTMENT_OTHER): Payer: Self-pay | Admitting: General Surgery

## 2010-12-05 ENCOUNTER — Other Ambulatory Visit: Payer: Self-pay | Admitting: *Deleted

## 2010-12-05 DIAGNOSIS — G5790 Unspecified mononeuropathy of unspecified lower limb: Secondary | ICD-10-CM

## 2010-12-05 NOTE — Telephone Encounter (Signed)
Last refill 4/27 

## 2010-12-09 MED ORDER — HYDROCODONE-ACETAMINOPHEN 5-325 MG PO TABS: ORAL_TABLET | ORAL | Status: DC

## 2010-12-09 NOTE — Telephone Encounter (Signed)
Rx called in 

## 2010-12-09 NOTE — Telephone Encounter (Signed)
Refill approved - nurse to complete. 

## 2010-12-10 ENCOUNTER — Other Ambulatory Visit: Payer: Self-pay | Admitting: *Deleted

## 2010-12-10 DIAGNOSIS — G5790 Unspecified mononeuropathy of unspecified lower limb: Secondary | ICD-10-CM

## 2010-12-11 NOTE — Telephone Encounter (Signed)
This was recently refilled 5/25; please confirm with pharmacy.

## 2010-12-12 ENCOUNTER — Encounter (HOSPITAL_BASED_OUTPATIENT_CLINIC_OR_DEPARTMENT_OTHER): Payer: PRIVATE HEALTH INSURANCE | Attending: General Surgery

## 2010-12-12 DIAGNOSIS — E1169 Type 2 diabetes mellitus with other specified complication: Secondary | ICD-10-CM | POA: Insufficient documentation

## 2010-12-12 DIAGNOSIS — L97509 Non-pressure chronic ulcer of other part of unspecified foot with unspecified severity: Secondary | ICD-10-CM | POA: Insufficient documentation

## 2010-12-12 DIAGNOSIS — L84 Corns and callosities: Secondary | ICD-10-CM | POA: Insufficient documentation

## 2010-12-12 NOTE — Telephone Encounter (Signed)
It was confirmed yesterday

## 2010-12-19 ENCOUNTER — Other Ambulatory Visit: Payer: Self-pay | Admitting: *Deleted

## 2010-12-19 DIAGNOSIS — E118 Type 2 diabetes mellitus with unspecified complications: Secondary | ICD-10-CM

## 2010-12-19 MED ORDER — INSULIN GLARGINE 100 UNIT/ML ~~LOC~~ SOLN: 140.0000 [IU] | Freq: Every day | SUBCUTANEOUS | Status: DC

## 2010-12-31 ENCOUNTER — Encounter: Payer: Self-pay | Admitting: Internal Medicine

## 2010-12-31 ENCOUNTER — Ambulatory Visit (INDEPENDENT_AMBULATORY_CARE_PROVIDER_SITE_OTHER): Payer: PRIVATE HEALTH INSURANCE | Admitting: Internal Medicine

## 2010-12-31 VITALS — BP 133/84 | HR 96 | Temp 97.8°F | Ht 78.0 in | Wt 375.3 lb

## 2010-12-31 DIAGNOSIS — E11621 Type 2 diabetes mellitus with foot ulcer: Secondary | ICD-10-CM

## 2010-12-31 DIAGNOSIS — E782 Mixed hyperlipidemia: Secondary | ICD-10-CM

## 2010-12-31 DIAGNOSIS — R109 Unspecified abdominal pain: Secondary | ICD-10-CM | POA: Insufficient documentation

## 2010-12-31 DIAGNOSIS — E118 Type 2 diabetes mellitus with unspecified complications: Secondary | ICD-10-CM

## 2010-12-31 DIAGNOSIS — E1169 Type 2 diabetes mellitus with other specified complication: Secondary | ICD-10-CM

## 2010-12-31 DIAGNOSIS — F528 Other sexual dysfunction not due to a substance or known physiological condition: Secondary | ICD-10-CM

## 2010-12-31 DIAGNOSIS — L97509 Non-pressure chronic ulcer of other part of unspecified foot with unspecified severity: Secondary | ICD-10-CM

## 2010-12-31 LAB — GLUCOSE, CAPILLARY: Glucose-Capillary: 73 mg/dL (ref 70–99)

## 2010-12-31 LAB — CBC WITH DIFFERENTIAL/PLATELET
Eosinophils Relative: 3 % (ref 0–5)
HCT: 37.4 % — ABNORMAL LOW (ref 39.0–52.0)
Hemoglobin: 12.2 g/dL — ABNORMAL LOW (ref 13.0–17.0)
Lymphocytes Relative: 38 % (ref 12–46)
Lymphs Abs: 2.3 10*3/uL (ref 0.7–4.0)
MCH: 28.9 pg (ref 26.0–34.0)
Neutro Abs: 2.9 10*3/uL (ref 1.7–7.7)
Platelets: 211 10*3/uL (ref 150–400)

## 2010-12-31 LAB — URINALYSIS, ROUTINE W REFLEX MICROSCOPIC
Bilirubin Urine: NEGATIVE
Leukocytes, UA: NEGATIVE
Nitrite: NEGATIVE
Specific Gravity, Urine: 1.014 (ref 1.005–1.030)
Urobilinogen, UA: 1 mg/dL (ref 0.0–1.0)

## 2010-12-31 LAB — LIPASE: Lipase: 17 U/L (ref 0–75)

## 2010-12-31 MED ORDER — INSULIN GLARGINE 100 UNIT/ML ~~LOC~~ SOLN: 140.0000 [IU] | Freq: Every day | SUBCUTANEOUS | Status: DC

## 2010-12-31 MED ORDER — SILDENAFIL CITRATE 100 MG PO TABS: 100.0000 mg | ORAL_TABLET | Freq: Every day | ORAL | Status: DC | PRN

## 2010-12-31 NOTE — Patient Instructions (Signed)
Continue current medications. Call or return if you have any persisting abdominal pain or side pain, or other new symptoms. Please schedule a followup appointment with Dr. Sharl Ma.

## 2010-12-31 NOTE — Assessment & Plan Note (Signed)
Patient reports that the 50 mg dose of Viagra is not completely effective, and requested an increase in dose.  The plan is to increase to 100 mg tablets with the same instructions as before.

## 2010-12-31 NOTE — Assessment & Plan Note (Signed)
Lab Results  Component Value Date   HGBA1C 8.4 10/27/2010   HGBA1C 8.9 07/16/2010   CREATININE 1.51* 09/23/2010   CREATININE 1.51* 09/23/2010   MICROALBUR 0.51 11/12/2009   MICRALBCREAT 4.6 11/12/2009   CHOL 118 09/23/2010   HDL 29* 09/23/2010   TRIG 248* 09/23/2010    Last eye exam and foot exam:    Component Value Date/Time   HMDIABFOOTEX done 01/01/2010    Assessment: Diabetes control: not controlled Progress toward goals: improved Barriers to meeting goals: financial need  Plan: Diabetes treatment: continue current medications Instruction/counseling given: reminded to get eye exam

## 2010-12-31 NOTE — Assessment & Plan Note (Signed)
Patient reports intermittent episodes of left flank pain for the past couple of weeks; these last for only a few minutes, are cramp-like in quality, and then resolve.  He has no associated symptoms.  He is nontender on exam, and has no CVA tenderness.  The plan is to check labs including a metabolic panel, lipase, CBC with differential, and urinalysis.  I advised patient to return if he has any worsening of his symptoms or if his symptoms persist.

## 2010-12-31 NOTE — Assessment & Plan Note (Signed)
Lipids:    Component Value Date/Time   CHOL 118 09/23/2010 1729   TRIG 248* 09/23/2010 1729   HDL 29* 09/23/2010 1729   LDLCALC 39 09/23/2010 1729   VLDL 50* 09/23/2010 1729   CHOLHDL 4.1 Ratio 09/23/2010 1729   LDL is at goal, and patient was doing well on current regimen.

## 2010-12-31 NOTE — Assessment & Plan Note (Signed)
Patient will follow up at the wound clinic for management.

## 2010-12-31 NOTE — Progress Notes (Signed)
  Subjective:    Patient ID: [name], male    DOB: Jan 31, 1951, 60 y.o.   MRN: [id number]  HPI Patient returns for followup of his diabetes mellitus, chronic renal insufficiency, and other chronic medical problems.  He has no acute complaints today.  His blood sugars have been somewhat better controlled (see copy of glucose meter download).  He is followed at the diabetes wound center for a recurrent left plantar diabetic foot ulcer, and has a dressing in place; he reports that he just finished a  14 day course of SMZ/TMP described by Dr. Lurene Shadow.  He reports that he has an eye exam scheduled for 6/23.  Patient reports that he has been compliant with his medications.   Review of Systems  Constitutional: Negative for fever, chills and diaphoresis.  Respiratory: Positive for shortness of breath (Occasional exertional dyspnea, none at rest.). Negative for cough and chest tightness.   Cardiovascular: Positive for leg swelling (Significantly better.). Negative for chest pain.  Gastrointestinal: Positive for abdominal pain (Occasional left side "cramps", lasts a few minutes, then resolves.). Negative for nausea, vomiting, blood in stool and anal bleeding.  Genitourinary: Negative for dysuria and difficulty urinating.  Musculoskeletal: Negative for myalgias and back pain.       Objective:   Physical Exam  Constitutional: No distress.  Cardiovascular: Normal rate, regular rhythm and normal heart sounds.  Exam reveals no gallop and no friction rub.   No murmur heard. Pulmonary/Chest: Effort normal and breath sounds normal. No respiratory distress. He has no wheezes. He has no rales.  Abdominal: Soft. Bowel sounds are normal. He exhibits no distension. There is no tenderness.  Musculoskeletal: He exhibits edema (2+ bilateral leg edema).          Assessment & Plan:

## 2011-01-01 LAB — COMPLETE METABOLIC PANEL WITH GFR
ALT: 31 U/L (ref 0–53)
Albumin: 4.3 g/dL (ref 3.5–5.2)
CO2: 27 mEq/L (ref 19–32)
GFR, Est African American: 51 mL/min — ABNORMAL LOW (ref >60)
GFR, Est Non African American: 42 mL/min — ABNORMAL LOW (ref >60)
Glucose, Bld: 76 mg/dL (ref 70–99)
Potassium: 4.2 mEq/L (ref 3.5–5.3)
Sodium: 139 mEq/L (ref 135–145)
Total Bilirubin: 0.4 mg/dL (ref 0.3–1.2)
Total Protein: 7.9 g/dL (ref 6.0–8.3)

## 2011-01-06 ENCOUNTER — Telehealth: Payer: Self-pay | Admitting: Dietician

## 2011-01-06 NOTE — Telephone Encounter (Signed)
[employer] told me they needs the "statment of certifying physician fo Therapuetic shoes. Spoke to Dr. Meredith Pel who wants to confirm, that [employer] and wound center (who is treating Jasten's feet)  have communicated regarding Mr. Balke specific diabetic shoe needs.   Called [employer] and spoke to [place] who told me that he is ready to order shoes and inserts specific to DR. Ballen's RX and request to accommodate [facility] foot ulcer. For [employer] to cover they shoes they also need the "statement of certifying Physician for Therapauetic Shoes completed from the doctor who is treating the patient's diabetes (not the wound center physician.)

## 2011-01-08 NOTE — Telephone Encounter (Signed)
Called [employer]: the form was received. It  has to be done once a a year. Patients shoes have been ordered.

## 2011-01-09 ENCOUNTER — Other Ambulatory Visit (HOSPITAL_BASED_OUTPATIENT_CLINIC_OR_DEPARTMENT_OTHER): Payer: Self-pay | Admitting: General Surgery

## 2011-01-09 LAB — GLUCOSE, CAPILLARY: Glucose-Capillary: 202 mg/dL — ABNORMAL HIGH (ref 70–99)

## 2011-01-13 ENCOUNTER — Other Ambulatory Visit: Payer: Self-pay | Admitting: *Deleted

## 2011-01-13 DIAGNOSIS — G5790 Unspecified mononeuropathy of unspecified lower limb: Secondary | ICD-10-CM

## 2011-01-13 MED ORDER — HYDROCODONE-ACETAMINOPHEN 5-325 MG PO TABS: ORAL_TABLET | ORAL | Status: DC

## 2011-01-13 NOTE — Telephone Encounter (Signed)
Refill approved - nurse to complete. 

## 2011-01-15 NOTE — Telephone Encounter (Signed)
Rx called in 

## 2011-01-16 ENCOUNTER — Encounter (HOSPITAL_BASED_OUTPATIENT_CLINIC_OR_DEPARTMENT_OTHER): Payer: PRIVATE HEALTH INSURANCE | Attending: General Surgery

## 2011-01-16 DIAGNOSIS — S98139A Complete traumatic amputation of one unspecified lesser toe, initial encounter: Secondary | ICD-10-CM | POA: Insufficient documentation

## 2011-01-16 DIAGNOSIS — Z79899 Other long term (current) drug therapy: Secondary | ICD-10-CM | POA: Insufficient documentation

## 2011-01-16 DIAGNOSIS — E1169 Type 2 diabetes mellitus with other specified complication: Secondary | ICD-10-CM | POA: Insufficient documentation

## 2011-01-16 DIAGNOSIS — L97509 Non-pressure chronic ulcer of other part of unspecified foot with unspecified severity: Secondary | ICD-10-CM | POA: Insufficient documentation

## 2011-01-30 ENCOUNTER — Ambulatory Visit: Payer: PRIVATE HEALTH INSURANCE | Admitting: Dietician

## 2011-02-09 ENCOUNTER — Other Ambulatory Visit: Payer: Self-pay | Admitting: Internal Medicine

## 2011-02-09 DIAGNOSIS — IMO0001 Reserved for inherently not codable concepts without codable children: Secondary | ICD-10-CM

## 2011-02-09 DIAGNOSIS — E118 Type 2 diabetes mellitus with unspecified complications: Secondary | ICD-10-CM

## 2011-02-09 NOTE — Telephone Encounter (Signed)
Pt was called; message left to call the clinic.

## 2011-02-09 NOTE — Telephone Encounter (Signed)
Pt states he was switched to [employer] at [facility] when [facility] pharmacy ran out of [employer].  He prefers the [employer].  And he takes 46 units at breakfast, 56 units at lunch, and 56 units dinner.

## 2011-02-09 NOTE — Telephone Encounter (Signed)
Recent refills have been for a Humalog Kwikpen through [facility] Medication Assistance program.  Please find out which insulin pen and pharmacy patient is using.   Also, the recent doses were 44 units with breakfast, 56 with lunch, and 56 with supper, plus the sliding scale; please confirm with patient whether this is his current dose.

## 2011-02-13 ENCOUNTER — Encounter (HOSPITAL_BASED_OUTPATIENT_CLINIC_OR_DEPARTMENT_OTHER): Payer: PRIVATE HEALTH INSURANCE | Attending: General Surgery

## 2011-02-13 DIAGNOSIS — L97509 Non-pressure chronic ulcer of other part of unspecified foot with unspecified severity: Secondary | ICD-10-CM | POA: Insufficient documentation

## 2011-02-13 DIAGNOSIS — Z79899 Other long term (current) drug therapy: Secondary | ICD-10-CM | POA: Insufficient documentation

## 2011-02-13 DIAGNOSIS — E1169 Type 2 diabetes mellitus with other specified complication: Secondary | ICD-10-CM | POA: Insufficient documentation

## 2011-02-13 DIAGNOSIS — S98139A Complete traumatic amputation of one unspecified lesser toe, initial encounter: Secondary | ICD-10-CM | POA: Insufficient documentation

## 2011-02-17 ENCOUNTER — Other Ambulatory Visit: Payer: Self-pay | Admitting: Dietician

## 2011-02-18 ENCOUNTER — Other Ambulatory Visit: Payer: Self-pay | Admitting: *Deleted

## 2011-02-18 NOTE — Telephone Encounter (Signed)
Called patient to let him know I was unable to locate/we did not receive fax from mail order pharmacy [facility].

## 2011-02-18 NOTE — Telephone Encounter (Signed)
Call from pt this am.  York Spaniel that he would like for his Novalog Flexpen refill to be sent to the [facility] on [address].  Pt said that he has plans to transfer to [facility] in the near future.  Refill for the Novalog Flexpen was called to the [facility] on [address] per order of Dr. Meredith Pel.

## 2011-02-18 NOTE — Telephone Encounter (Signed)
Rx has been sent in. 

## 2011-03-02 ENCOUNTER — Other Ambulatory Visit: Payer: Self-pay | Admitting: Dietician

## 2011-03-02 NOTE — Telephone Encounter (Signed)
[name] would like to be referred to [facility]. Called them on his behalf to make referral.

## 2011-03-02 NOTE — Telephone Encounter (Signed)
[facility] to contact [name].

## 2011-03-04 ENCOUNTER — Telehealth: Payer: Self-pay | Admitting: Dietician

## 2011-03-04 NOTE — Telephone Encounter (Signed)
Have 3 mail order forms for diabetes testing supplies for [name]. Called to see if he will be getting these from [employer]. He has not bee in touch with them yet, but says he would like to get medicines and supplies from them if possible. We are to hold on to forms until he is sure her can do this.

## 2011-03-04 NOTE — Telephone Encounter (Signed)
[name] called to say he should be set up with [facility] to start next month. He will need to get refills at [facility] this month and asks that we call [facility] to be sure he has refills. Will request triage nurse assist with this request

## 2011-03-06 NOTE — Telephone Encounter (Signed)
Called [facility]. They said patient should have refills on all his medicines to get him through this month until [facility] mail order begins.

## 2011-03-10 ENCOUNTER — Other Ambulatory Visit: Payer: Self-pay | Admitting: *Deleted

## 2011-03-10 DIAGNOSIS — G5790 Unspecified mononeuropathy of unspecified lower limb: Secondary | ICD-10-CM

## 2011-03-10 MED ORDER — GABAPENTIN 100 MG PO CAPS: 100.0000 mg | ORAL_CAPSULE | Freq: Three times a day (TID) | ORAL | Status: DC

## 2011-03-10 MED ORDER — ATORVASTATIN CALCIUM 20 MG PO TABS: 20.0000 mg | ORAL_TABLET | Freq: Every day | ORAL | Status: DC

## 2011-03-10 MED ORDER — FUROSEMIDE 20 MG PO TABS: 20.0000 mg | ORAL_TABLET | Freq: Two times a day (BID) | ORAL | Status: DC

## 2011-03-10 MED ORDER — HYDROCODONE-ACETAMINOPHEN 5-325 MG PO TABS: ORAL_TABLET | ORAL | Status: DC

## 2011-03-10 NOTE — Telephone Encounter (Signed)
Refills approved - nurse to call in the hydrocodone/acetaminophen refill.

## 2011-03-11 NOTE — Telephone Encounter (Signed)
vicodin Rx called in

## 2011-03-19 ENCOUNTER — Encounter (HOSPITAL_BASED_OUTPATIENT_CLINIC_OR_DEPARTMENT_OTHER): Payer: PRIVATE HEALTH INSURANCE | Attending: Internal Medicine

## 2011-03-19 DIAGNOSIS — L97509 Non-pressure chronic ulcer of other part of unspecified foot with unspecified severity: Secondary | ICD-10-CM | POA: Insufficient documentation

## 2011-03-19 DIAGNOSIS — S98139A Complete traumatic amputation of one unspecified lesser toe, initial encounter: Secondary | ICD-10-CM | POA: Insufficient documentation

## 2011-03-19 DIAGNOSIS — Z79899 Other long term (current) drug therapy: Secondary | ICD-10-CM | POA: Insufficient documentation

## 2011-03-19 DIAGNOSIS — E1169 Type 2 diabetes mellitus with other specified complication: Secondary | ICD-10-CM | POA: Insufficient documentation

## 2011-03-19 DIAGNOSIS — L84 Corns and callosities: Secondary | ICD-10-CM | POA: Insufficient documentation

## 2011-03-23 ENCOUNTER — Telehealth: Payer: Self-pay | Admitting: Dietician

## 2011-03-23 NOTE — Telephone Encounter (Signed)
Told patient I would  Check with Dr. Meredith Pel nurse and triage nurse and we would get back to him.

## 2011-03-24 ENCOUNTER — Other Ambulatory Visit: Payer: Self-pay | Admitting: *Deleted

## 2011-03-24 DIAGNOSIS — G5790 Unspecified mononeuropathy of unspecified lower limb: Secondary | ICD-10-CM

## 2011-03-24 DIAGNOSIS — E118 Type 2 diabetes mellitus with unspecified complications: Secondary | ICD-10-CM

## 2011-03-24 MED ORDER — INSULIN ASPART 100 UNIT/ML ~~LOC~~ SOLN: SUBCUTANEOUS | Status: DC

## 2011-03-24 MED ORDER — FUROSEMIDE 20 MG PO TABS: 20.0000 mg | ORAL_TABLET | Freq: Two times a day (BID) | ORAL | Status: DC

## 2011-03-24 MED ORDER — OMEPRAZOLE 20 MG PO CPDR: 20.0000 mg | DELAYED_RELEASE_CAPSULE | Freq: Every day | ORAL | Status: DC

## 2011-03-24 MED ORDER — PRODIGY TWIST TOP LANCETS 28G MISC: Status: DC

## 2011-03-24 MED ORDER — GLUCOSE BLOOD VI STRP: ORAL_STRIP | Status: DC

## 2011-03-24 MED ORDER — AMITRIPTYLINE HCL 75 MG PO TABS: 75.0000 mg | ORAL_TABLET | Freq: Every day | ORAL | Status: DC

## 2011-03-24 MED ORDER — INSULIN GLARGINE 100 UNIT/ML ~~LOC~~ SOLN: 140.0000 [IU] | Freq: Every day | SUBCUTANEOUS | Status: DC

## 2011-03-24 MED ORDER — HYDROCODONE-ACETAMINOPHEN 5-325 MG PO TABS: ORAL_TABLET | ORAL | Status: DC

## 2011-03-24 MED ORDER — SILDENAFIL CITRATE 100 MG PO TABS: 100.0000 mg | ORAL_TABLET | Freq: Every day | ORAL | Status: DC | PRN

## 2011-03-24 MED ORDER — INSULIN PEN NEEDLE 31G X 8 MM MISC: Status: DC

## 2011-03-24 MED ORDER — GABAPENTIN 100 MG PO CAPS: 100.0000 mg | ORAL_CAPSULE | Freq: Three times a day (TID) | ORAL | Status: DC

## 2011-03-24 MED ORDER — "INSULIN SYRINGE-NEEDLE U-100 31G X 5/16"" 1 ML MISC": Status: DC

## 2011-03-24 MED ORDER — ATORVASTATIN CALCIUM 20 MG PO TABS: 20.0000 mg | ORAL_TABLET | Freq: Every day | ORAL | Status: DC

## 2011-03-24 MED ORDER — ASPIRIN 81 MG PO TBEC: 81.0000 mg | DELAYED_RELEASE_TABLET | Freq: Every day | ORAL | Status: DC

## 2011-03-24 NOTE — Telephone Encounter (Signed)
[facility] is changing to [facility] so he needs new fills sent to them

## 2011-03-24 NOTE — Telephone Encounter (Signed)
Spoke w/ PPA rep, katie, i have sent a refill request for each item and we will go from there, she says if they are rec'd this pm hopefully everything will get to him fri

## 2011-03-25 ENCOUNTER — Telehealth: Payer: Self-pay | Admitting: *Deleted

## 2011-03-25 MED ORDER — ACCU-CHEK AVIVA PLUS W/DEVICE KIT: 1.0000 | PACK | Freq: Three times a day (TID) | Status: DC

## 2011-03-25 NOTE — Telephone Encounter (Signed)
Spoke w/ pt, states he is feeling a little better and wants to wait it out, he will need an appt w/ dr Meredith Pel within the month

## 2011-03-25 NOTE — Telephone Encounter (Signed)
Per [facility], because patient has a medicare Advantage plan, he will need to call his advantage [plan and tell them he does not want to use their service and wants to use [facility].

## 2011-03-25 NOTE — Telephone Encounter (Signed)
Patient called about meter and strips from [facility]. We discussed that he may be best served by getting medicines and diabetes supplies from [facility]. He agreed. Informed him that triage nurse informed me that he should receive medicines and supplies son friday. Note incorrect meter and supplies on medicine list. Patient would like to use an accu-chek meter so his doctors can download it. He is currently using an Embrace meter. Will coordinate with Triage nurse nad [facility] concerning meter and supplies brand.    Also has cough, when he coughs head & chest hurts, wants to know if there there is OTC medicine.He says he has been bothered by allergies in the past.  I suggested he ask at pharmacy for diabetic robitussin. Will also forward to attending for their input. He does not have a future appointment scheduled. Last office visit was 12/31/10.

## 2011-03-25 NOTE — Telephone Encounter (Signed)
Open for info

## 2011-03-27 ENCOUNTER — Telehealth: Payer: Self-pay | Admitting: Dietician

## 2011-03-27 NOTE — Telephone Encounter (Signed)
Needs a prescription for pain medicines sent to [facility]. (They delivered everythng else today except his pain medicines.)  Was not aware of the co-pay with [facility]. The charge is $21.00 dollars for strips. Had to send test strips back because he could not pay for them this month. Has strips for meter we cannot download. Will begin using accu-chek next month.  Waiting on an appointment with Dr. Meredith Pel- waiting to hear from [place]. Will ask Front office to contact him.

## 2011-03-27 NOTE — Telephone Encounter (Signed)
i have now spoken to [facility], because pt has only medicare PPA must try to collect something for diab supplies, he only has to pay $1.00 if that is all he can pay but by law they must try to collect something, his strips are being sent back out to him.

## 2011-03-27 NOTE — Telephone Encounter (Signed)
i spoke w/ pt and PPA also w/ the rep, jeanne, she is looking into the strip problem, pt's pain med has been called in also one of his insulins did not go electronically and that was called in.

## 2011-04-21 ENCOUNTER — Telehealth: Payer: Self-pay | Admitting: Dietician

## 2011-04-21 NOTE — Telephone Encounter (Signed)
Told patient I would investigate possible resources and call him back with update.

## 2011-04-22 NOTE — Telephone Encounter (Signed)
Called MAP program: they no longer assist with helping patients to get Viagra.  Called [place] [employer]:  Social worker suggested patient assistance programs through drug company. There is one Patent examiner to Care, but drug is mailed to doctor office.

## 2011-04-23 NOTE — Telephone Encounter (Signed)
CDE called [employer] and patient to discuss. It appears that patient can use the [employer] patient assistance program, but both he and doctor have to complete application, medicine is delivered to doctor office and doctor's office has to call for refills. Application mailed to patient and a copy put in Dr. Meredith Pel box. told patient this is not something we usually do and his doctor would have to agree to it.  I also mailed patient a Together Rx discount card that he can try using at his pharmacy.

## 2011-04-27 ENCOUNTER — Encounter: Payer: Self-pay | Admitting: Dietician

## 2011-04-30 ENCOUNTER — Encounter (HOSPITAL_BASED_OUTPATIENT_CLINIC_OR_DEPARTMENT_OTHER): Payer: PRIVATE HEALTH INSURANCE | Attending: Internal Medicine

## 2011-04-30 DIAGNOSIS — L97509 Non-pressure chronic ulcer of other part of unspecified foot with unspecified severity: Secondary | ICD-10-CM | POA: Insufficient documentation

## 2011-04-30 DIAGNOSIS — Z79899 Other long term (current) drug therapy: Secondary | ICD-10-CM | POA: Insufficient documentation

## 2011-04-30 DIAGNOSIS — L84 Corns and callosities: Secondary | ICD-10-CM | POA: Insufficient documentation

## 2011-04-30 DIAGNOSIS — S98139A Complete traumatic amputation of one unspecified lesser toe, initial encounter: Secondary | ICD-10-CM | POA: Insufficient documentation

## 2011-04-30 DIAGNOSIS — E1169 Type 2 diabetes mellitus with other specified complication: Secondary | ICD-10-CM | POA: Insufficient documentation

## 2011-05-05 ENCOUNTER — Other Ambulatory Visit: Payer: Self-pay | Admitting: *Deleted

## 2011-05-05 DIAGNOSIS — K056 Periodontal disease, unspecified: Secondary | ICD-10-CM

## 2011-05-05 MED ORDER — GELCLAIR MT GEL: OROMUCOSAL | Status: DC

## 2011-05-05 NOTE — H&P (Signed)
  NAMEARAVIND, [name] NO.:  [id number]  MEDICAL RECORD NO.:  [id number]          PATIENT TYPE:  OUT  LOCATION:  XRAY                         FACILITY:  [facility]  PHYSICIAN:  Joanne Gavel, M.D.        DATE OF BIRTH:  [birth year]  DATE OF ADMISSION:  05/09/2010 DATE OF DISCHARGE:                             HISTORY & PHYSICAL   CHIEF COMPLAINT:  Diabetic foot ulcer.  HISTORY OF PRESENT ILLNESS:  This is a 60 year old male seen many times in this clinic with a recurrent left plantar diabetic foot ulcer.  He has been diabetic for approximately 5 years.  He has profound neuropathy.  The ulcer is in the same location at the third metatarsal head.  He has had a previous toe amputation.  Cigarettes, he quit 8 months ago.  Alcohol occasionally.  Medications include NovoLog and Levemir insulin, also hydrocodone and baby aspirin.  Past medical history is significant for asthma and bronchitis.  ALLERGIES:  None known.  PHYSICAL EXAMINATION:  VITAL SIGNS:  Temperature 98.6, pulse 97, respirations 18, blood pressure 136/89.  Glucose is 203. EYES, EARS, NOSE, THROAT:  Normal. NECK:  Supple. CHEST:  Clear. HEART:  Regular rhythm. EXTREMITIES:  Good peripheral pulses.  There is a large callus 3 x 0.5. After the callus was excised, this easily probes to the bone.  Bleeding is moderate.  IMPRESSION:  The patient has a Wagner III diabetic foot ulceration.  We will set him up now with a TCC.  He will almost certainly require hyperbaric oxygen.     Joanne Gavel, M.D.     RA/MEDQ  D:  05/09/2010  T:  05/09/2010  Job:  [id number]  Electronically Signed by Joanne Gavel M.D. on 05/05/2011 08:45:12 AM

## 2011-05-05 NOTE — Telephone Encounter (Signed)
Medication per pharmacy no longer available.  Can this be changed to something else.

## 2011-05-05 NOTE — Telephone Encounter (Signed)
Gelclair no longer available.  Need a change to something else.

## 2011-05-06 NOTE — Telephone Encounter (Signed)
Do they have a comparable medication on formulary?

## 2011-05-07 NOTE — Telephone Encounter (Signed)
Call to pharmacist about Gelclair will need to research the issue and then send the request back to pt's PCP.  Rosmary Dionisio,RN 05/07/2011 3:03 Pm.

## 2011-05-11 ENCOUNTER — Other Ambulatory Visit: Payer: Self-pay | Admitting: *Deleted

## 2011-05-11 NOTE — Telephone Encounter (Signed)
Pharmacy is asking for 300 mg tablets of the Gabapentin.

## 2011-05-13 MED ORDER — GABAPENTIN 100 MG PO CAPS: 100.0000 mg | ORAL_CAPSULE | Freq: Three times a day (TID) | ORAL | Status: DC

## 2011-05-13 MED ORDER — ATORVASTATIN CALCIUM 20 MG PO TABS: 20.0000 mg | ORAL_TABLET | Freq: Every day | ORAL | Status: DC

## 2011-05-14 ENCOUNTER — Encounter (HOSPITAL_BASED_OUTPATIENT_CLINIC_OR_DEPARTMENT_OTHER): Payer: PRIVATE HEALTH INSURANCE | Attending: Internal Medicine

## 2011-05-14 DIAGNOSIS — Z794 Long term (current) use of insulin: Secondary | ICD-10-CM | POA: Insufficient documentation

## 2011-05-14 DIAGNOSIS — Z79899 Other long term (current) drug therapy: Secondary | ICD-10-CM | POA: Insufficient documentation

## 2011-05-14 DIAGNOSIS — E1169 Type 2 diabetes mellitus with other specified complication: Secondary | ICD-10-CM | POA: Insufficient documentation

## 2011-05-14 DIAGNOSIS — S98139A Complete traumatic amputation of one unspecified lesser toe, initial encounter: Secondary | ICD-10-CM | POA: Insufficient documentation

## 2011-05-14 DIAGNOSIS — L84 Corns and callosities: Secondary | ICD-10-CM | POA: Insufficient documentation

## 2011-05-14 DIAGNOSIS — L97509 Non-pressure chronic ulcer of other part of unspecified foot with unspecified severity: Secondary | ICD-10-CM | POA: Insufficient documentation

## 2011-05-14 DIAGNOSIS — Z8614 Personal history of Methicillin resistant Staphylococcus aureus infection: Secondary | ICD-10-CM | POA: Insufficient documentation

## 2011-06-08 ENCOUNTER — Encounter: Payer: Self-pay | Admitting: Internal Medicine

## 2011-06-08 ENCOUNTER — Ambulatory Visit (INDEPENDENT_AMBULATORY_CARE_PROVIDER_SITE_OTHER): Payer: PRIVATE HEALTH INSURANCE | Admitting: Internal Medicine

## 2011-06-08 ENCOUNTER — Other Ambulatory Visit: Payer: Self-pay | Admitting: *Deleted

## 2011-06-08 VITALS — BP 148/83 | HR 85 | Temp 98.7°F | Ht 78.0 in | Wt 388.7 lb

## 2011-06-08 DIAGNOSIS — N189 Chronic kidney disease, unspecified: Secondary | ICD-10-CM

## 2011-06-08 DIAGNOSIS — Z23 Encounter for immunization: Secondary | ICD-10-CM

## 2011-06-08 DIAGNOSIS — G5790 Unspecified mononeuropathy of unspecified lower limb: Secondary | ICD-10-CM

## 2011-06-08 DIAGNOSIS — E782 Mixed hyperlipidemia: Secondary | ICD-10-CM

## 2011-06-08 DIAGNOSIS — R062 Wheezing: Secondary | ICD-10-CM | POA: Insufficient documentation

## 2011-06-08 DIAGNOSIS — L84 Corns and callosities: Secondary | ICD-10-CM

## 2011-06-08 MED ORDER — ALBUTEROL SULFATE HFA 108 (90 BASE) MCG/ACT IN AERS: 2.0000 | INHALATION_SPRAY | Freq: Four times a day (QID) | RESPIRATORY_TRACT | Status: DC | PRN

## 2011-06-08 MED ORDER — HYDROCODONE-ACETAMINOPHEN 5-325 MG PO TABS: ORAL_TABLET | ORAL | Status: DC

## 2011-06-08 NOTE — Progress Notes (Signed)
  Subjective:    Patient ID: [name], male    DOB: 1951/04/08, 60 y.o.   MRN: [id number]  HPI Patient returns for followup of his diabetes mellitus, hyperlipidemia, renal insufficiency, and other chronic medical problems.  He reports that he has been doing well overall.  He does complain of occasional wheezing at night, and says that in the past he was on a metered-dose inhaler several years ago for relief of occasional symptoms.  He has some stable exertional dyspnea.   He denies any history of asthma or COPD.  He reports that he has been taking his medications as prescribed.  He has continued to follow up at the foot clinic, and is wearing a protective boot which he says has improved healing of the ulcer on his left foot.  He has not recently seen his endocrinologist Dr. Sharl Ma, and does not have an appointment with Dr. Sharl Ma.   Review of Systems  Constitutional: Negative for fever, chills, diaphoresis and appetite change.  Cardiovascular: Negative for chest pain.  Gastrointestinal: Negative for nausea, vomiting and abdominal pain.       Objective:   Physical Exam  Constitutional: No distress.  Cardiovascular: Normal rate, regular rhythm and normal heart sounds.  Exam reveals no gallop and no friction rub.   No murmur heard. Pulmonary/Chest: Effort normal and breath sounds normal. No respiratory distress. He has no wheezes. He has no rales.  Skin:             Assessment & Plan:

## 2011-06-08 NOTE — Assessment & Plan Note (Addendum)
Patient has a large callus on the sole of his left foot anteriorly, which is currently being followed at the foot center.  I advised him to follow up there for management.  He is currently wearing a protective boot.

## 2011-06-08 NOTE — Patient Instructions (Signed)
Start albuterol inhaler 2 puffs four times a day as needed for wheezing or shortness of breath. Please schedule an appointment with Dr. Sharl Ma for follow up of your diabetes.

## 2011-06-08 NOTE — Telephone Encounter (Signed)
Refill approved - nurse to complete. 

## 2011-06-08 NOTE — Assessment & Plan Note (Signed)
Patient is doing well on his current dose of atorvastatin.  The plan is to continue at current dose.  Will check a metabolic panel today.

## 2011-06-08 NOTE — Assessment & Plan Note (Signed)
  Component Value Date/Time   CREATININE 1.68* 12/31/2010 1617   CREATININE 1.51* 09/23/2010 1729   CREATININE 1.51* 09/23/2010 1006   CREATININE 1.41 07/16/2010 1850   CREATININE 1.58* 04/22/2010 2320   CREATININE 1.42 03/04/2010 2123    Patient's creatinine is relatively stable, although his last measurement was slightly elevated compared to recent baseline.  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  The plan is to recheck a metabolic panel today.  If renal function has declined further, then consider referral to nephrology.

## 2011-06-08 NOTE — Assessment & Plan Note (Signed)
Patient reports occasional wheezing at night, and reports that in the past he was on an inhaled bronchodilator (probably albuterol) with relief of symptoms.  He does not have a known history of asthma or COPD.  His symptoms are currently mild and intermittent.  The plan is to treat with albuterol HFA as needed; I advised him that if his symptoms increase, then we will likely need to work this up further with pulmonary function testing.

## 2011-06-08 NOTE — Assessment & Plan Note (Signed)
Hemoglobin A1C  Date Value Range Status  06/08/2011 8.6   Final  10/27/2010 8.4   Final  07/16/2010 8.9   Final    Assessment: Diabetes control: not controlled Progress toward goals: unchanged Barriers to meeting goals: lack of understanding of disease management  Plan: Diabetes treatment: continue current medications; I advised patient to schedule a followup appointment with his endocrinologist Dr. Sharl Ma Refer to: endocrinologist Instruction/counseling given: discussed foot care

## 2011-06-09 LAB — COMPLETE METABOLIC PANEL WITH GFR
Albumin: 4.2 g/dL (ref 3.5–5.2)
BUN: 23 mg/dL (ref 6–23)
Calcium: 9.4 mg/dL (ref 8.4–10.5)
Chloride: 100 mEq/L (ref 96–112)
GFR, Est Non African American: 51 mL/min — ABNORMAL LOW
Glucose, Bld: 273 mg/dL — ABNORMAL HIGH (ref 70–99)
Potassium: 4.8 mEq/L (ref 3.5–5.3)
Sodium: 135 mEq/L (ref 135–145)
Total Protein: 7 g/dL (ref 6.0–8.3)

## 2011-06-09 NOTE — Telephone Encounter (Signed)
Hydrocodone-acet 5/325mg  rx called to [facility].

## 2011-06-10 ENCOUNTER — Ambulatory Visit: Payer: PRIVATE HEALTH INSURANCE | Admitting: Internal Medicine

## 2011-06-17 ENCOUNTER — Encounter (INDEPENDENT_AMBULATORY_CARE_PROVIDER_SITE_OTHER): Payer: PRIVATE HEALTH INSURANCE | Admitting: Ophthalmology

## 2011-06-17 DIAGNOSIS — E11319 Type 2 diabetes mellitus with unspecified diabetic retinopathy without macular edema: Secondary | ICD-10-CM

## 2011-06-17 DIAGNOSIS — H3581 Retinal edema: Secondary | ICD-10-CM

## 2011-06-17 DIAGNOSIS — H43819 Vitreous degeneration, unspecified eye: Secondary | ICD-10-CM

## 2011-06-17 DIAGNOSIS — E1139 Type 2 diabetes mellitus with other diabetic ophthalmic complication: Secondary | ICD-10-CM

## 2011-06-18 ENCOUNTER — Encounter (HOSPITAL_BASED_OUTPATIENT_CLINIC_OR_DEPARTMENT_OTHER): Payer: PRIVATE HEALTH INSURANCE

## 2011-06-22 ENCOUNTER — Encounter (INDEPENDENT_AMBULATORY_CARE_PROVIDER_SITE_OTHER): Payer: PRIVATE HEALTH INSURANCE | Admitting: Ophthalmology

## 2011-06-22 DIAGNOSIS — H3581 Retinal edema: Secondary | ICD-10-CM

## 2011-06-24 ENCOUNTER — Telehealth: Payer: Self-pay | Admitting: Dietician

## 2011-06-24 NOTE — Telephone Encounter (Signed)
Patient called to schedule with CDE and CDE noted he is out of breath while talking. He has been taking his inhaler as directed and reports he had just come in from mailbox.  He is taking 1- 20 mg lasix twice a day, but thought he was supposed to be taking 2- twice a day. Chart reflects that  He is taking the correct dose of lasix.  Will route to Dr. Meredith Pel to verify dose.

## 2011-06-24 NOTE — Telephone Encounter (Signed)
His dose of the furosemide is one 20 mg tablet twice a day.  If he is having problems with shortness of breath, then I would advise that he come in for evaluation.

## 2011-06-25 NOTE — Telephone Encounter (Signed)
Called patient to relay Dr. Meredith Pel information. Patient thinks he was out of breath from exercise yesterday. Has appointment with D=CDE next week.

## 2011-06-29 ENCOUNTER — Ambulatory Visit: Payer: PRIVATE HEALTH INSURANCE | Admitting: Dietician

## 2011-06-30 ENCOUNTER — Encounter (HOSPITAL_COMMUNITY): Payer: Self-pay | Admitting: Emergency Medicine

## 2011-06-30 ENCOUNTER — Telehealth: Payer: Self-pay | Admitting: Dietician

## 2011-06-30 ENCOUNTER — Emergency Department (HOSPITAL_COMMUNITY)
Admission: EM | Admit: 2011-06-30 | Discharge: 2011-06-30 | Disposition: A | Discharge status: Home / Self Care | Payer: PRIVATE HEALTH INSURANCE | Attending: Emergency Medicine | Admitting: Emergency Medicine

## 2011-06-30 DIAGNOSIS — Z794 Long term (current) use of insulin: Secondary | ICD-10-CM | POA: Insufficient documentation

## 2011-06-30 DIAGNOSIS — R05 Cough: Secondary | ICD-10-CM | POA: Insufficient documentation

## 2011-06-30 DIAGNOSIS — E119 Type 2 diabetes mellitus without complications: Secondary | ICD-10-CM | POA: Insufficient documentation

## 2011-06-30 DIAGNOSIS — K219 Gastro-esophageal reflux disease without esophagitis: Secondary | ICD-10-CM | POA: Insufficient documentation

## 2011-06-30 DIAGNOSIS — Z79899 Other long term (current) drug therapy: Secondary | ICD-10-CM | POA: Insufficient documentation

## 2011-06-30 DIAGNOSIS — E785 Hyperlipidemia, unspecified: Secondary | ICD-10-CM | POA: Insufficient documentation

## 2011-06-30 DIAGNOSIS — R059 Cough, unspecified: Secondary | ICD-10-CM | POA: Insufficient documentation

## 2011-06-30 DIAGNOSIS — Z7982 Long term (current) use of aspirin: Secondary | ICD-10-CM | POA: Insufficient documentation

## 2011-06-30 DIAGNOSIS — R1310 Dysphagia, unspecified: Secondary | ICD-10-CM | POA: Insufficient documentation

## 2011-06-30 DIAGNOSIS — J029 Acute pharyngitis, unspecified: Secondary | ICD-10-CM

## 2011-06-30 LAB — URINALYSIS, ROUTINE W REFLEX MICROSCOPIC
Bilirubin Urine: NEGATIVE
Ketones, ur: NEGATIVE mg/dL
Leukocytes, UA: NEGATIVE
Nitrite: NEGATIVE
Protein, ur: 30 mg/dL — AB

## 2011-06-30 LAB — URINE MICROSCOPIC-ADD ON

## 2011-06-30 MED ORDER — AMOXICILLIN 500 MG PO CAPS
1000.0000 mg | ORAL_CAPSULE | Freq: Once | ORAL | Status: AC
  Administered 2011-06-30: 1000 mg via ORAL
  Filled 2011-06-30: qty 2

## 2011-06-30 MED ORDER — AMOXICILLIN 500 MG PO CAPS: 500.0000 mg | ORAL_CAPSULE | Freq: Three times a day (TID) | ORAL | Status: AC

## 2011-06-30 MED ORDER — HYDROCODONE-ACETAMINOPHEN 5-325 MG PO TABS
2.0000 | ORAL_TABLET | Freq: Once | ORAL | Status: AC
  Administered 2011-06-30: 2 via ORAL
  Filled 2011-06-30: qty 2

## 2011-06-30 MED ORDER — HYDROCODONE-ACETAMINOPHEN 7.5-500 MG/15ML PO SOLN: ORAL | Status: DC

## 2011-06-30 NOTE — ED Notes (Signed)
Pt. Woke up yesterday with a sore throat. Swollen, painful to swallow and also lt. Rib pain from coughing

## 2011-06-30 NOTE — ED Provider Notes (Signed)
History     CSN: [id number] Arrival date & time: 06/30/2011  8:53 AM   First MD Initiated Contact with Patient 06/30/11 1118      Chief Complaint  Patient presents with  . Sore Throat    (Consider location/radiation/quality/duration/timing/severity/associated sxs/prior treatment) HPI.... sore throat for 24 hours. Able to swallow but painful. Cough.  No stiff neck. Does not appear toxic  Past Medical History  Diagnosis Date  . Diabetes mellitus     w/complication NOS, type II  . Edema, macular, due to secondary diabetes   . Retinopathy   . Neuropathy, lower extremity   . Hyperlipidemia   . Chronic renal insufficiency   . GERD (gastroesophageal reflux disease)   . Erectile dysfunction   . Osteomyelitis of ankle and foot   . Hemorrhoids     No past surgical history on file.  Family History  Problem Relation Age of Onset  . Diabetes Mother     also 2 siblings  . Heart attack Father 63  . Throat cancer Brother     History  Substance Use Topics  . Smoking status: Former Smoker -- 0.5 packs/day for 30 years    Types: Cigarettes    Quit date: 02/10/2010  . Smokeless tobacco: Not on file  . Alcohol Use: Not on file      Review of Systems  All other systems reviewed and are negative.    Allergies  Vancomycin  Home Medications   Current Outpatient Rx  Name Route Sig Dispense Refill  . ALBUTEROL SULFATE HFA 108 (90 BASE) MCG/ACT IN AERS Inhalation Inhale 2 puffs into the lungs every 6 (six) hours as needed. For wheezing or shortness of breath     . AMITRIPTYLINE HCL 75 MG PO TABS Oral Take 1 tablet (75 mg total) by mouth daily. 30 tablet 5  . ASPIRIN 81 MG PO TBEC Oral Take 1 tablet (81 mg total) by mouth daily. 90 tablet 3  . ATORVASTATIN CALCIUM 20 MG PO TABS Oral Take 1 tablet (20 mg total) by mouth daily. 90 tablet 1  . FUROSEMIDE 20 MG PO TABS Oral Take 1 tablet (20 mg total) by mouth 2 (two) times daily. 180 tablet 1  . GABAPENTIN 100 MG PO CAPS Oral  Take 1 capsule (100 mg total) by mouth 3 (three) times daily. 300 capsule 1  . HYDROCODONE-ACETAMINOPHEN 5-325 MG PO TABS Oral Take 1.5 tablets by mouth every 6 (six) hours as needed. Take one and one-half (1 & 1/2) tablets by mouth every six (6) hours as needed for pain.     . INSULIN ASPART 100 UNIT/ML [place] SOLN Subcutaneous Inject 44-56 Units into the skin 3 (three) times daily before meals. Inject 44 units before breakfast, 56 units before lunch, and 56 units before dinner, plus 1 unit for every 10 mg/dl above 782 mg/dl, three times a day at meals.    . INSULIN GLARGINE 100 UNIT/ML [place] SOLN Subcutaneous Inject 140 Units into the skin daily. 42 mL 12  . OMEPRAZOLE 20 MG PO CPDR Oral Take 20 mg by mouth daily.      Marland Kitchen ACCU-CHEK FASTCLIX LANCETS MISC  Use as directed to check blood sugar.     . AMOXICILLIN 500 MG PO CAPS Oral Take 1 capsule (500 mg total) by mouth 3 (three) times daily. 21 capsule 0  . ACCU-CHEK AVIVA PLUS W/DEVICE KIT Does not apply 1 each by Does not apply route 4 (four) times daily -  with meals and at  bedtime. 1 kit 0  . GLUCOSE BLOOD VI STRP  Use to test blood sugar before meals and at bedtime 100 each 6  . HYDROCODONE-ACETAMINOPHEN 7.5-500 MG/15ML PO SOLN  15 ml po q6h prn pain ......dispense 150 ml 45 mL 0  . INSULIN PEN NEEDLE 31G X 8 MM MISC  For insulin injection two times a day 100 each 6  . INSULIN SYRINGE-NEEDLE U-100 31G X 5/16" 1 ML MISC  Use as directed. 100 each 6    BP 133/72  Pulse 93  Temp(Src) 98.5 F (36.9 C) (Oral)  Resp 20  Ht 6\' 6"  (1.981 m)  Wt 380 lb (172.367 kg)  BMI 43.91 kg/m2  SpO2 98%  Physical Exam  Nursing note and vitals reviewed. Constitutional: He is oriented to person, place, and time. He appears well-developed and well-nourished.  HENT:  Head: Normocephalic and atraumatic.       Throat is erythematous.  No peritonsillar abscess  Eyes: Conjunctivae and EOM are normal. Pupils are equal, round, and reactive to light.  Neck: Normal range  of motion. Neck supple.       No meningeal signs  Cardiovascular: Normal rate and regular rhythm.   Pulmonary/Chest: Effort normal and breath sounds normal.  Abdominal: Soft. Bowel sounds are normal.  Musculoskeletal: Normal range of motion.  Neurological: He is alert and oriented to person, place, and time.  Skin: Skin is warm and dry.  Psychiatric: He has a normal mood and affect.    ED Course  Procedures (including critical care time)  Labs Reviewed  URINALYSIS, ROUTINE W REFLEX MICROSCOPIC - Abnormal; Notable for the following:    Protein, ur 30 (*)    All other components within normal limits  RAPID STREP SCREEN  URINE MICROSCOPIC-ADD ON   No results found.   1. Pharyngitis       MDM  Although strep is negative, we'll treat for strep secondary to multiple risk factors and immunocompromised state.        Donnetta Hutching, MD 06/30/11 [phone]

## 2011-06-30 NOTE — Telephone Encounter (Signed)
Went to ER today and was diagnosed with strep throat. He thinks the C- PAP gave it to him. He wakes up with extremely dry mouth since using C-PAP. Discussed that humidifier on C-PAP may help.   Will discuss with triage nurses and Dr. Meredith Pel. Not sure if patient needs order for assistance with his C-PAP.

## 2011-07-02 NOTE — Telephone Encounter (Signed)
It is certainly fine to ask his respiratory therapist to look into this with regard to the complaint of dry mouth.  However, his sore throat is not likely related to the CPAP.

## 2011-07-03 NOTE — Telephone Encounter (Signed)
Thank you . Will relay this message to patient next week when he comes in for his appointment per his request.

## 2011-07-09 ENCOUNTER — Ambulatory Visit (INDEPENDENT_AMBULATORY_CARE_PROVIDER_SITE_OTHER): Payer: PRIVATE HEALTH INSURANCE | Admitting: Dietician

## 2011-07-09 NOTE — Progress Notes (Signed)
Medical Nutrition Therapy:  Appt start time: 1430 end time:  1515.  Assessment:  Primary concerns today: Blood sugar control and Meal planning, wants to reduce his weight.  Patient reports he has been discharged from wound center and Dr. Daune Perch office. Niece looks at his foot and says wound still closed just before christmas. Patient recently had laser surgery to both eyes by Dr. Ashley Royalty. Reminded him micral due. Left foot swells daily. He is using lite salt, rinsing canned goods to try to help swelling. Bought seasonings without salt to flavor foods and does most of his own cooking with some meals brought in by niece. Plans to join [employer] at [facility] next week. Usual eating pattern includes Meal 2 and 3+ snacks per day.    7-9 AM: Banana and 16 oz milk or water and medication, 150 units lantus & 50-60 units Novolog (60 today) 11-1 Pm soup & sandwich & diet drink or water with 60 units Novolog Snack 3-4 Pm peanuts, peacans, popcorn, graham crackers 5-7 meat, greens, water, potato salad and 60 units Novolog 9-11 snack-  Popcorn, nuts, diet drink Avoided foods include: cookies and candy, soda and salty foods as much as possible per patient  Usual physical activity includes none at present Note A1C improved to 8.6%. Patient states his goal is 7%    Progress Towards Goal(s):  In progress   Nutritional Diagnosis:  [place]-3.3 Overweight/obesity As related to excess energy intake, lack of physical activity and insulin resistance.  As evidenced by BMI of 47.  Intervention(s): 1- education about precautions to take when starting and exercise program and on insulin. 2- Reviewed meter download  3- Coordination of care- discuss with nurse patient needs help with CPAP. Discuss with doctor consider increasing lantus,  small bolus with PM snack and decrease daytime meal boluses to improve blood sugars and prevent hypolglycemia. Reminded patient and doctor that urine micral, foot exam are due. Call Dr.  Ashley Royalty for eye exam report.    Monitoring/Evaluation:  Dietary intake in 3 week(s) to assist with weight loss goals

## 2011-07-09 NOTE — Patient Instructions (Addendum)
EXERCISE PRECAUTIONS:  Take amount Novolog recommended on exercise days. (you should need 5-12 units less)  Carry to [facility] when going to exercise: meter, candy or glucose tablets, water Check blood sugar before and after exercise. Check more often later that day and carry meter with you.  Follow rules provided today regarding what blood sugar should be before exercise.  Please make a follow up in 4 weeks with Norm Parcel. - need to follow your weight, insulin doses as you add exercise.

## 2011-07-13 ENCOUNTER — Encounter (INDEPENDENT_AMBULATORY_CARE_PROVIDER_SITE_OTHER): Payer: PRIVATE HEALTH INSURANCE | Admitting: Ophthalmology

## 2011-07-13 DIAGNOSIS — E11359 Type 2 diabetes mellitus with proliferative diabetic retinopathy without macular edema: Secondary | ICD-10-CM

## 2011-07-22 ENCOUNTER — Other Ambulatory Visit: Payer: Self-pay | Admitting: *Deleted

## 2011-07-22 MED ORDER — HYDROCODONE-ACETAMINOPHEN 5-325 MG PO TABS: ORAL_TABLET | ORAL | Status: DC

## 2011-07-22 MED ORDER — OMEPRAZOLE 20 MG PO CPDR: 20.0000 mg | DELAYED_RELEASE_CAPSULE | Freq: Every day | ORAL | Status: DC

## 2011-07-22 MED ORDER — FUROSEMIDE 20 MG PO TABS: 20.0000 mg | ORAL_TABLET | Freq: Two times a day (BID) | ORAL | Status: DC

## 2011-07-22 NOTE — Telephone Encounter (Signed)
Refill approved - nurse to call in the hydrocodone/acetaminophen prescription.

## 2011-07-22 NOTE — Telephone Encounter (Signed)
Rx for vicodin Faxed in

## 2011-07-22 NOTE — Telephone Encounter (Signed)
Called to pharm 

## 2011-08-04 ENCOUNTER — Other Ambulatory Visit: Payer: Self-pay | Admitting: *Deleted

## 2011-08-04 DIAGNOSIS — E118 Type 2 diabetes mellitus with unspecified complications: Secondary | ICD-10-CM

## 2011-08-04 NOTE — Telephone Encounter (Signed)
Pt is transferring his medications to [facility].  Fax to [phone].  Telephone number is [phone].  Prescriptions will need to be redone and faxed.  Do not do transfer prescriptions.  Angelina Ok, RN 08/04/2011 11:57 AM.

## 2011-08-05 ENCOUNTER — Ambulatory Visit (HOSPITAL_COMMUNITY)
Admission: RE | Admit: 2011-08-05 | Discharge: 2011-08-05 | Disposition: A | Discharge status: Home / Self Care | Payer: PRIVATE HEALTH INSURANCE | Source: Ambulatory Visit | Attending: Internal Medicine | Admitting: Internal Medicine

## 2011-08-05 ENCOUNTER — Ambulatory Visit (INDEPENDENT_AMBULATORY_CARE_PROVIDER_SITE_OTHER): Payer: PRIVATE HEALTH INSURANCE | Admitting: Internal Medicine

## 2011-08-05 ENCOUNTER — Encounter: Payer: Self-pay | Admitting: Internal Medicine

## 2011-08-05 DIAGNOSIS — R9431 Abnormal electrocardiogram [ECG] [EKG]: Secondary | ICD-10-CM | POA: Insufficient documentation

## 2011-08-05 DIAGNOSIS — G5790 Unspecified mononeuropathy of unspecified lower limb: Secondary | ICD-10-CM

## 2011-08-05 DIAGNOSIS — E119 Type 2 diabetes mellitus without complications: Secondary | ICD-10-CM

## 2011-08-05 DIAGNOSIS — I499 Cardiac arrhythmia, unspecified: Secondary | ICD-10-CM

## 2011-08-05 DIAGNOSIS — N189 Chronic kidney disease, unspecified: Secondary | ICD-10-CM

## 2011-08-05 MED ORDER — FUROSEMIDE 20 MG PO TABS: 20.0000 mg | ORAL_TABLET | Freq: Two times a day (BID) | ORAL | Status: DC

## 2011-08-05 MED ORDER — AMITRIPTYLINE HCL 75 MG PO TABS: 75.0000 mg | ORAL_TABLET | Freq: Every day | ORAL | Status: DC

## 2011-08-05 MED ORDER — OMEPRAZOLE 20 MG PO CPDR: 20.0000 mg | DELAYED_RELEASE_CAPSULE | Freq: Every day | ORAL | Status: DC

## 2011-08-05 MED ORDER — INSULIN ASPART 100 UNIT/ML ~~LOC~~ SOLN: SUBCUTANEOUS | Status: DC

## 2011-08-05 NOTE — Assessment & Plan Note (Signed)
Hemoglobin A1C  Date Value Range Status  06/08/2011 8.6   Final  10/27/2010 8.4   Final  07/16/2010 8.9   Final     Assessment: Diabetes control: not controlled Progress toward goals: unchanged Barriers to meeting goals: lack of understanding of disease management  Plan: Diabetes treatment: Increase breakfast NovoLog dose to 48 units plus correction insulin; continue other insulin regimen as before Refer to: diabetes educator for self-management training Instruction/counseling given: discussed foot care

## 2011-08-05 NOTE — Progress Notes (Signed)
  Subjective:    Patient ID: [name], male    DOB: 22-Mar-1951, 61 y.o.   MRN: [id number]  HPI Patient returns for followup of his type 2 diabetes mellitus, hypertension, and other chronic medical problems.  He has no acute complaint today, and reports that he has been doing well.  His blood sugars have been elevated, more so in the morning; a.m. values have been in the low 200s on average, with an average in the 160s at other times.  Patient reports that he is compliant with his medications.  He has chronic bilateral foot pain, left greater than right, which is adequately controlled on his current dose of hydrocodone/acetaminophen.  He reports that he was discharged from the [facility] wound clinic at the end of November since the ulcer on his foot had healed.   Review of Systems  Constitutional: Negative for fever, chills, diaphoresis and appetite change.  Respiratory: Negative for cough and shortness of breath.   Cardiovascular: Negative for chest pain and leg swelling.  Gastrointestinal: Negative for nausea, vomiting and abdominal pain.  Genitourinary: Positive for frequency. Negative for dysuria.       Objective:   Physical Exam  Constitutional: No distress.  Cardiovascular: An irregularly irregular rhythm present. Exam reveals no S3 and no S4.   No murmur heard.      1+ bilateral leg edema  Pulmonary/Chest: Effort normal and breath sounds normal. No respiratory distress. He has no wheezes. He has no rales.  Abdominal: Soft. Bowel sounds are normal. There is no tenderness.  Skin:             Assessment & Plan:

## 2011-08-05 NOTE — Assessment & Plan Note (Signed)
Creat  Date Value Range Status  06/08/2011 1.46* 0.50-1.35 (mg/dL) Final  1/61/0960 4.54* 0.50-1.35 (mg/dL) Final     Creatinine, Ser  Date Value Range Status  09/23/2010 1.51* 0.40-1.50 (mg/dL) Final     Assessment: Patient's renal function has been relatively stable.  Plan: Will check a metabolic panel today.

## 2011-08-05 NOTE — Assessment & Plan Note (Signed)
Assessment: Patient's rhythm was irregular on exam, but his EKG shows sinus rhythm.  He is completely asymptomatic.  Plan: No further workup at this time.

## 2011-08-05 NOTE — Assessment & Plan Note (Addendum)
Assessment: Patient's pain is reasonably well-controlled on current regimen.  Plan: Continue amitriptyline, gabapentin, and hydrocodone/acetaminophen at current dose.

## 2011-08-05 NOTE — Patient Instructions (Signed)
Increase your breakfast dose of NovoLog insulin to 48 units plus correction insulin as before; continue other insulin dosing as before.

## 2011-08-06 ENCOUNTER — Other Ambulatory Visit: Payer: Self-pay | Admitting: *Deleted

## 2011-08-06 LAB — BASIC METABOLIC PANEL WITH GFR
BUN: 19 mg/dL (ref 6–23)
CO2: 27 mEq/L (ref 19–32)
Chloride: 102 mEq/L (ref 96–112)
Glucose, Bld: 142 mg/dL — ABNORMAL HIGH (ref 70–99)
Potassium: 4.5 mEq/L (ref 3.5–5.3)

## 2011-08-06 MED ORDER — INSULIN ASPART 100 UNIT/ML ~~LOC~~ SOLN: SUBCUTANEOUS | Status: DC

## 2011-08-06 MED ORDER — INSULIN GLARGINE 100 UNIT/ML ~~LOC~~ SOLN: 150.0000 [IU] | Freq: Every day | SUBCUTANEOUS | Status: DC

## 2011-08-06 MED ORDER — INSULIN PEN NEEDLE 31G X 8 MM MISC: Status: DC

## 2011-08-06 MED ORDER — HYDROCODONE-ACETAMINOPHEN 5-325 MG PO TABS: ORAL_TABLET | ORAL | Status: DC

## 2011-08-06 MED ORDER — ATORVASTATIN CALCIUM 20 MG PO TABS: 20.0000 mg | ORAL_TABLET | Freq: Every day | ORAL | Status: DC

## 2011-08-06 MED ORDER — GABAPENTIN 100 MG PO CAPS: 100.0000 mg | ORAL_CAPSULE | Freq: Three times a day (TID) | ORAL | Status: DC

## 2011-08-06 NOTE — Telephone Encounter (Signed)
Addended by: Margarito Liner on: 08/06/2011 05:44 PM   Modules accepted: Orders, Medications

## 2011-08-06 NOTE — Telephone Encounter (Signed)
PT WANTS PENS FOR HIS INSULIN !

## 2011-08-07 ENCOUNTER — Telehealth: Payer: Self-pay | Admitting: *Deleted

## 2011-08-07 NOTE — Telephone Encounter (Signed)
Call to [facility] where pt Rx's were sent and spoke with Johnny.  I told him pt wants Flex pens not vials of insulin.  He will cancel order for vials.

## 2011-08-07 NOTE — Telephone Encounter (Signed)
Norco 5/325mg  rx called to [facility].

## 2011-08-26 ENCOUNTER — Other Ambulatory Visit: Payer: Self-pay | Admitting: *Deleted

## 2011-08-26 MED ORDER — ALBUTEROL SULFATE HFA 108 (90 BASE) MCG/ACT IN AERS: 2.0000 | INHALATION_SPRAY | Freq: Four times a day (QID) | RESPIRATORY_TRACT | Status: DC | PRN

## 2011-09-07 ENCOUNTER — Telehealth: Payer: Self-pay | Admitting: *Deleted

## 2011-09-07 NOTE — Telephone Encounter (Signed)
I did an order for testing supplies in January 2013 for a different company (scanned under the Media tab).  I have asked Jasmine December to have someone clarify why I am now getting requests from this company.

## 2011-09-07 NOTE — Telephone Encounter (Signed)
[name] said yes he needs testing supplies and to please process the request

## 2011-09-09 ENCOUNTER — Other Ambulatory Visit: Payer: Self-pay | Admitting: *Deleted

## 2011-09-09 MED ORDER — HYDROCODONE-ACETAMINOPHEN 5-325 MG PO TABS: ORAL_TABLET | ORAL | Status: DC

## 2011-09-09 NOTE — Telephone Encounter (Signed)
i spoke w/ pt again and he states that he will remain w/ [facility] and they also will do all his meds, he states "all these people be calling and promising me free stuff and i just tell them ok, i explained that it is good to find one pharmacy and stick with it and yes a lot of companies will call and make promises that end up not being what one would think, that both of these companies may be completely truthful but things can become confusing by changing pharmacies often. Pt is agreeable and will stay for the time being with [facility]

## 2011-09-09 NOTE — Telephone Encounter (Signed)
Refill approved - nurse to complete. 

## 2011-09-10 NOTE — Telephone Encounter (Signed)
Called to pharm 

## 2011-09-16 ENCOUNTER — Encounter: Payer: Self-pay | Admitting: Internal Medicine

## 2011-09-16 ENCOUNTER — Ambulatory Visit (INDEPENDENT_AMBULATORY_CARE_PROVIDER_SITE_OTHER): Payer: PRIVATE HEALTH INSURANCE | Admitting: Internal Medicine

## 2011-09-16 VITALS — BP 153/95 | HR 86 | Temp 97.2°F | Ht 78.0 in | Wt >= 6400 oz

## 2011-09-16 DIAGNOSIS — N189 Chronic kidney disease, unspecified: Secondary | ICD-10-CM

## 2011-09-16 DIAGNOSIS — E782 Mixed hyperlipidemia: Secondary | ICD-10-CM

## 2011-09-16 DIAGNOSIS — G4733 Obstructive sleep apnea (adult) (pediatric): Secondary | ICD-10-CM

## 2011-09-16 DIAGNOSIS — R42 Dizziness and giddiness: Secondary | ICD-10-CM

## 2011-09-16 DIAGNOSIS — R609 Edema, unspecified: Secondary | ICD-10-CM

## 2011-09-16 DIAGNOSIS — Z79899 Other long term (current) drug therapy: Secondary | ICD-10-CM

## 2011-09-16 DIAGNOSIS — L84 Corns and callosities: Secondary | ICD-10-CM

## 2011-09-16 MED ORDER — INSULIN GLARGINE 100 UNIT/ML ~~LOC~~ SOLN: 155.0000 [IU] | Freq: Every day | SUBCUTANEOUS | Status: DC

## 2011-09-16 MED ORDER — INSULIN ASPART 100 UNIT/ML ~~LOC~~ SOLN: SUBCUTANEOUS | Status: DC

## 2011-09-16 MED ORDER — FUROSEMIDE 20 MG PO TABS: ORAL_TABLET | ORAL | Status: DC

## 2011-09-16 NOTE — Assessment & Plan Note (Addendum)
Assessment: Patient has stable bilateral lower extremity pitting edema.  Plan: Given the mild to moderate elevation in his blood pressure, and the edema, the plan is to increase Lasix to a dose of 40 mg each morning and 20 mg each evening.

## 2011-09-16 NOTE — Progress Notes (Signed)
  Subjective:    Patient ID: [name], male    DOB: 06/23/1951, 61 y.o.   MRN: [id number]  HPI The patient returns for followup of his diabetes mellitus, edema, and other chronic medical problems.  His only new complaint is some recent dizziness when he stands up which he noted yesterday; the symptom resolves after her a few seconds.  This is better today.  Patient reports that he is compliant with his medications.  He saw a podiatrist and had the large callus on his left sole trimmed, and he also needs new diabetic shoes.  His blood sugars have been elevated (see report of home glucose meter).  Review of Systems  Constitutional: Negative for fever, chills and diaphoresis.  Respiratory: Positive for shortness of breath (Stable shorntess of breath with exertion.). Negative for cough.   Cardiovascular: Positive for leg swelling. Negative for chest pain.  Gastrointestinal: Positive for nausea (Yesterday, improved today.). Negative for abdominal pain.  Genitourinary: Negative for dysuria and frequency.  Neurological: Negative for speech difficulty, weakness and numbness.       Objective:   Physical Exam  Constitutional: No distress.  Cardiovascular: Normal rate, regular rhythm and normal heart sounds.  Exam reveals no gallop and no friction rub.   No murmur heard.      2+ bilateral lower extremity edema  Pulmonary/Chest: Effort normal and breath sounds normal. No respiratory distress. He has no wheezes. He has no rales.  Musculoskeletal:       Feet:         Assessment & Plan:

## 2011-09-16 NOTE — Assessment & Plan Note (Signed)
Lab Results  Component Value Date   CREATININE 1.44* 08/05/2011   CREATININE 1.46* 06/08/2011   CREATININE 1.68* 12/31/2010     Assessment: Patient's renal function has been stable.    Plan: The plan is to check a metabolic panel today.

## 2011-09-16 NOTE — Assessment & Plan Note (Signed)
Assessment: Patient has a large callus on his left sole at the base of his second toe.  He has seen a podiatrist and will followup with the podiatrist.  The podiatrist has recommended a change in his diabetic shoes.  Plan: Will complete new order for diabetic shoes; patient will followup with podiatrist.

## 2011-09-16 NOTE — Assessment & Plan Note (Addendum)
Assessment: Patient reports transient episodes of postural dizziness yesterday that occurred when he stood up; these have improved today.  He is not orthostatic by exam.  Plan: The plan is to check a CBC today to make sure patient is not anemic.  I advised patient to let me know if this symptom worsens or persists.  I advised him to be careful when standing up.

## 2011-09-16 NOTE — Patient Instructions (Addendum)
Increase Lasix 20 mg to a dose of 2 tablets each morning and 1 tablet each evening. Increase NovoLog insulin to a dose of 52 units before breakfast, 58 units before lunch, and 56 units before dinner, plus 1 unit for every 10 mg/dl above 454 mg/dl. Increase Lantus insulin to a dose of 155 units daily.

## 2011-09-16 NOTE — Assessment & Plan Note (Addendum)
Hemoglobin A1C  Date Value Range Status  09/16/2011 8.5   Final  06/08/2011 8.6   Final  10/27/2010 8.4   Final     Assessment: Diabetes control: not controlled Progress toward goals: unchanged Barriers to meeting goals: no barriers identified  Plan: Diabetes treatment: Plan is to increase NovoLog insulin to a dose of 52 units with breakfast, 58 units with lunch, and continue 56 units with supper plus correction insulin with meals as before; will also increase Lantus insulin to a dose of 55 units daily. Refer to: Patient will followup with diabetes educator as scheduled Instruction/counseling given: discussed foot care

## 2011-09-21 ENCOUNTER — Telehealth: Payer: Self-pay | Admitting: Dietician

## 2011-09-21 NOTE — Telephone Encounter (Signed)
Called patient and relayed DR. Joines message. Patient verbalized understanding

## 2011-09-21 NOTE — Telephone Encounter (Signed)
It is OK for him to stop the gabapentin if he feels better without it.  For now I would not advise taking anything to replace it.  He should call if he has any increase in pain.

## 2011-09-21 NOTE — Telephone Encounter (Signed)
Patient reports that he has been having dizzy spells and they were suspecting his blood pressure, but found his blood pressure was okay. Three days ago he stopped his gabapentin and has not had a dizzy spell since then. He is going to the gym  Now and is not going to take it again today so he is not dizzy at the gym.   He wants Dr. Meredith Pel to know and to know if he should be taking anything in it's place? Told him that I would route this note to Dr. Meredith Pel.

## 2011-09-29 ENCOUNTER — Telehealth: Payer: Self-pay | Admitting: Dietician

## 2011-09-29 NOTE — Telephone Encounter (Signed)
Patient called for clarification about a letter he received form our office about his appointment with dr. Meredith Pel being changed form April to may. He is due for lipid panel and microalbumin and agrees to come in for a lab appointment so dr. Meredith Pel will have this information before his next visit.   [name] also mentioned that callus that podiatrist cut is now bleeding and he plans to call and go back to wound center.

## 2011-09-30 NOTE — Telephone Encounter (Signed)
I agree that he should go to the wound center; if he has any problems coming appointment, then he should let us know.

## 2011-10-05 ENCOUNTER — Encounter (HOSPITAL_BASED_OUTPATIENT_CLINIC_OR_DEPARTMENT_OTHER): Payer: PRIVATE HEALTH INSURANCE | Attending: Internal Medicine

## 2011-10-05 ENCOUNTER — Other Ambulatory Visit (HOSPITAL_BASED_OUTPATIENT_CLINIC_OR_DEPARTMENT_OTHER): Payer: Self-pay | Admitting: Internal Medicine

## 2011-10-05 ENCOUNTER — Ambulatory Visit (HOSPITAL_COMMUNITY)
Admission: RE | Admit: 2011-10-05 | Discharge: 2011-10-05 | Disposition: A | Discharge status: Home / Self Care | Payer: PRIVATE HEALTH INSURANCE | Source: Ambulatory Visit | Attending: Internal Medicine | Admitting: Internal Medicine

## 2011-10-05 DIAGNOSIS — L84 Corns and callosities: Secondary | ICD-10-CM | POA: Insufficient documentation

## 2011-10-05 DIAGNOSIS — E119 Type 2 diabetes mellitus without complications: Secondary | ICD-10-CM | POA: Insufficient documentation

## 2011-10-05 DIAGNOSIS — Z7982 Long term (current) use of aspirin: Secondary | ICD-10-CM | POA: Insufficient documentation

## 2011-10-05 DIAGNOSIS — Z79899 Other long term (current) drug therapy: Secondary | ICD-10-CM | POA: Insufficient documentation

## 2011-10-05 DIAGNOSIS — T148XXA Other injury of unspecified body region, initial encounter: Secondary | ICD-10-CM

## 2011-10-05 DIAGNOSIS — Z794 Long term (current) use of insulin: Secondary | ICD-10-CM | POA: Insufficient documentation

## 2011-10-05 DIAGNOSIS — L97509 Non-pressure chronic ulcer of other part of unspecified foot with unspecified severity: Secondary | ICD-10-CM | POA: Insufficient documentation

## 2011-10-05 DIAGNOSIS — X58XXXA Exposure to other specified factors, initial encounter: Secondary | ICD-10-CM | POA: Insufficient documentation

## 2011-10-05 NOTE — Progress Notes (Signed)
[facility]  NAME:  [name], [name]            ACCOUNT NO.:  [id number]  MEDICAL RECORD NO.:  [id number]      DATE OF BIRTH:  [birth year]  PHYSICIAN:  Jonelle Sports. Javaria Knapke, M.D.  VISIT DATE:  10/05/2011                                  OFFICE VISIT   HISTORY:  This 61 year old black male had been previously treated here for plantar ulcer on the left foot and was released in early to mid October 2012.  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  This area developed heavy callus and eventually ulcerated proximal to the 2nd toe.  This is what was treated here recently using several different techniques to include total contact casting.  He reports that since that surgical treatments here, he has had no subsequent hospitalizations or other significant intercurrent illness.  He has had redevelopment of heavy callus at that area and had seen a podiatrist for that once in late December.  None the less, the callus has returned and become associated over the past several weeks again with ulceration and drainage.  He was seen by the podiatrist who felt that he should be referred here for our repeat evaluation and advice.  PAST MEDICAL HISTORY:  As indicated above.  No significant change since last visit.  ALLERGIES:  He has no known medicinal allergies.  MEDICATIONS:  His regular medications include: 1. Amitriptyline 75 mg daily. 2. Viagra 50 mg daily. 3. Lipitor 70 mg daily. 4. Lantus 155 units daily. 5. NovoLog 56 units at breakfast, 58 at lunch, and 52 at supper and     NovoLog sliding scale advised for sugars temporarily out of     control. 6. He takes Prilosec 20 mg daily. 7. Furosemide 20 mg daily. 8. Hydrocodone 5/325 mg daily. 9. Baby aspirin daily. 10.Gabapentin 100 mg t.i.d.  PHYSICAL EXAMINATION:  VITAL SIGNS: Examination today shows  blood pressure 127/81, pulse 84, respirations 16, and temperature 97.7.  He is 6 feet 6 inches tall, 390 pounds in weight, 184 mg/dL is his random blood sugar at this time. GENERAL: The patient is alert and cooperative, in no distress, well hydrated with good skin texture and turgor.  He has some low-grade edema in both distal lower extremities.  Has good easily palpable pulses at both dorsalis pedis and posterior tibial areas in the left foot which is the foot of concern. EXTREMITIES: On the plantar aspect of that foot overlying the 2nd metatarsal head, it is a really exophytic-type irregular callus just proximal to which is an area of open ulceration covered again by significant callus.  IMPRESSION:  Ulcerated callus, plantar aspect left foot in a gentleman with longstanding type 2 diabetes.  DISPOSITION: 1. The wound is debrided under topical lidocaine ointment and the     distal portion of this callus, when sharply pared away, reveals what      Appears to be a non-related ulceration. The proximal aspect of he wound     erodes into an area of ulceration which shows some undermining. After     adequate debridement, dermal and subcutaneous, the entire area now measures     2.4 x 1.6 cm and  is approximately 5 cm in depth.  The base is reasonably clean     although it is anticipated that over time some additional portions     of subcutaneous tissue will need to be resected as well.  He is treated today with an application of silver alginate in this wound and this then covered with a protective dressing.  He is placed in a Darco platform walker advised to use that until his return here.  His followup visit here will be in 1 week.          ______________________________ Jonelle Sports. Cheryll Cockayne, M.D.     RES/MEDQ  D:  10/05/2011  T:  10/05/2011  Job:  [id number]

## 2011-10-06 ENCOUNTER — Telehealth: Payer: Self-pay | Admitting: Dietician

## 2011-10-06 NOTE — Telephone Encounter (Signed)
Went to wound center yesterday opened his foot up, will be in boot again for a while. They told him to eat lower fat and start multivitamins which we discussed today.  He wanted to let us know they also did a Chest xray and labs and said something about his cholesterol and kidneys. He is waiting on call from front office for lab appointment.

## 2011-10-08 ENCOUNTER — Other Ambulatory Visit (INDEPENDENT_AMBULATORY_CARE_PROVIDER_SITE_OTHER): Payer: PRIVATE HEALTH INSURANCE

## 2011-10-08 DIAGNOSIS — Z202 Contact with and (suspected) exposure to infections with a predominantly sexual mode of transmission: Secondary | ICD-10-CM

## 2011-10-08 LAB — COMPLETE METABOLIC PANEL WITH GFR
Albumin: 3.4 g/dL — ABNORMAL LOW (ref 3.5–5.2)
Alkaline Phosphatase: 185 U/L — ABNORMAL HIGH (ref 39–117)
CO2: 27 mEq/L (ref 19–32)
Chloride: 100 mEq/L (ref 96–112)
GFR, Est Non African American: 55 mL/min — ABNORMAL LOW
Glucose, Bld: 195 mg/dL — ABNORMAL HIGH (ref 70–99)
Potassium: 4.4 mEq/L (ref 3.5–5.3)
Sodium: 138 mEq/L (ref 135–145)
Total Protein: 7.2 g/dL (ref 6.0–8.3)

## 2011-10-08 LAB — LIPID PANEL
HDL: 24 mg/dL — ABNORMAL LOW (ref >39)
LDL Cholesterol: 62 mg/dL (ref 0–99)
Total CHOL/HDL Ratio: 6.6 Ratio

## 2011-10-08 LAB — CBC WITH DIFFERENTIAL/PLATELET
Basophils Absolute: 0 10*3/uL (ref 0.0–0.1)
Eosinophils Relative: 4 % (ref 0–5)
HCT: 39.9 % (ref 39.0–52.0)
Lymphocytes Relative: 32 % (ref 12–46)
Lymphs Abs: 2.1 10*3/uL (ref 0.7–4.0)
MCV: 88.7 fL (ref 78.0–100.0)
Monocytes Absolute: 0.4 10*3/uL (ref 0.1–1.0)
RDW: 14.7 % (ref 11.5–15.5)
WBC: 6.5 10*3/uL (ref 4.0–10.5)

## 2011-10-08 NOTE — Telephone Encounter (Signed)
I reviewed CXR which is negative, and labs are OK (lipid panel is pending).  If he develops any acute problems, then would advise an acute appointment in [facility].  Otherwise, follow up at wound center, and with me as scheduled,.

## 2011-10-12 ENCOUNTER — Encounter (HOSPITAL_BASED_OUTPATIENT_CLINIC_OR_DEPARTMENT_OTHER): Payer: PRIVATE HEALTH INSURANCE | Attending: Internal Medicine

## 2011-10-12 DIAGNOSIS — E7800 Pure hypercholesterolemia, unspecified: Secondary | ICD-10-CM | POA: Insufficient documentation

## 2011-10-12 DIAGNOSIS — L97509 Non-pressure chronic ulcer of other part of unspecified foot with unspecified severity: Secondary | ICD-10-CM | POA: Insufficient documentation

## 2011-10-12 DIAGNOSIS — Z7982 Long term (current) use of aspirin: Secondary | ICD-10-CM | POA: Insufficient documentation

## 2011-10-12 DIAGNOSIS — S98139A Complete traumatic amputation of one unspecified lesser toe, initial encounter: Secondary | ICD-10-CM | POA: Insufficient documentation

## 2011-10-12 DIAGNOSIS — Z79899 Other long term (current) drug therapy: Secondary | ICD-10-CM | POA: Insufficient documentation

## 2011-10-12 DIAGNOSIS — E1169 Type 2 diabetes mellitus with other specified complication: Secondary | ICD-10-CM | POA: Insufficient documentation

## 2011-10-12 DIAGNOSIS — Z794 Long term (current) use of insulin: Secondary | ICD-10-CM | POA: Insufficient documentation

## 2011-10-14 ENCOUNTER — Ambulatory Visit: Payer: PRIVATE HEALTH INSURANCE | Admitting: Internal Medicine

## 2011-10-15 ENCOUNTER — Other Ambulatory Visit: Payer: Self-pay | Admitting: Dietician

## 2011-10-15 NOTE — Telephone Encounter (Signed)
Musa spoke with [facility] and they are waiting on a fax to be returned form our office. Adeel is calling to follow up on this because they are supposed to send his shipment out tomorrow. Will check with refill nurse and front office for location of fax.

## 2011-10-16 ENCOUNTER — Other Ambulatory Visit: Payer: Self-pay | Admitting: *Deleted

## 2011-10-16 NOTE — Telephone Encounter (Signed)
Last refill for vicodin on 2/22  , per [facility] I called [facility] and they filled vicodin # 180 on 3/17 Will check with Tobey Bride to see if this is what her request is for.

## 2011-10-16 NOTE — Telephone Encounter (Signed)
Unable to locate fax from [facility]. Spoke with both front office and refill nurse who was going to call patient on 10/15/11.

## 2011-10-16 NOTE — Telephone Encounter (Signed)
Called [facility] and spoke with [name].  [facility] has already rec'd the form back signed and dated for Jan by Dr. Meredith Pel. This form was also already scanned in our system as well.  [facility] has been trying to contact [name] for several days and he has yet to return their phone call.  [facility] has a new policy that the patient must contact them to state that he wishes to still use their company.  Also the [facility] has requested the past 6 months record which are being faxed a of today.

## 2011-10-16 NOTE — Telephone Encounter (Signed)
I talked with pt and he wants to get all refills at [facility].  I will remove other pharmacies from his med list. I also informed his he is not due for refill until 4/17 and he is fine with that. So, no refill at this time.

## 2011-10-19 NOTE — Telephone Encounter (Signed)
Agree 

## 2011-10-23 ENCOUNTER — Other Ambulatory Visit (HOSPITAL_BASED_OUTPATIENT_CLINIC_OR_DEPARTMENT_OTHER): Payer: Self-pay | Admitting: Internal Medicine

## 2011-10-23 ENCOUNTER — Ambulatory Visit (HOSPITAL_COMMUNITY)
Admission: RE | Admit: 2011-10-23 | Discharge: 2011-10-23 | Disposition: A | Discharge status: Home / Self Care | Payer: PRIVATE HEALTH INSURANCE | Source: Ambulatory Visit | Attending: Internal Medicine | Admitting: Internal Medicine

## 2011-10-23 DIAGNOSIS — M79673 Pain in unspecified foot: Secondary | ICD-10-CM

## 2011-10-23 DIAGNOSIS — M7730 Calcaneal spur, unspecified foot: Secondary | ICD-10-CM | POA: Insufficient documentation

## 2011-10-23 DIAGNOSIS — S98139A Complete traumatic amputation of one unspecified lesser toe, initial encounter: Secondary | ICD-10-CM | POA: Insufficient documentation

## 2011-10-23 DIAGNOSIS — M79609 Pain in unspecified limb: Secondary | ICD-10-CM | POA: Insufficient documentation

## 2011-10-23 NOTE — Progress Notes (Signed)
[facility]  NAME:  [name], [name] NO.:  [id number]  MEDICAL RECORD NO.:  [id number]      DATE OF BIRTH:  [birth year]  PHYSICIAN:  Ardath Sax, M.D.           VISIT DATE:                                  OFFICE VISIT   [name] is a 61 year old African American male who has been a diabetic for about 20 years.  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  This gentleman has taken good care of his diabetes.  His last A1c was 7.5.  He currently takes metformin for his diabetes.  He does have hypercholesterolemia, and he is on simvastatin for his cholesterol.  He also is taking Lantus 155 units a day, NovoLog 56 units at breakfast, 58 at lunch, 52 at supper. He takes Prilosec.  He also is on Lasix, aspirin, and gabapentin.  He today was afebrile.  He had a blood pressure of 140/84.  He apparently has been losing weight, but at the present time weighs 390 pounds.  His pulse is 80, respirations 18.  The ulcer that he has on the plantar aspect of his left foot is near the base of the second metatarsal.  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  I debrided it of callus, and we are treating the wound with collagen, and we also put him on doxycycline.  He states that his sugar today was 110.  He is wearing an offloading boot, which keeps all the weight off the MP area of the left foot.  He has got excellent pulses.  I can feel both the dorsalis pedis and a posterior tibial.  We put the collagen in today and started him on antibiotics, and we are going to apply for HBO as I feel since he has got a Wagner 3 with a history of osteo with his diabetes being in good control that we will add HBO to his treatment to see if we can heal this ulcer along with the debridements, collagen, and antibiotics.     Ardath Sax, M.D.     PP/MEDQ  D:  10/23/2011  T:   10/23/2011  Job:  [id number]

## 2011-10-26 ENCOUNTER — Other Ambulatory Visit: Payer: Self-pay | Admitting: *Deleted

## 2011-10-26 MED ORDER — HYDROCODONE-ACETAMINOPHEN 5-325 MG PO TABS: ORAL_TABLET | ORAL | Status: DC

## 2011-10-26 NOTE — Telephone Encounter (Signed)
Due to pick up 10/28/11 at pharmacy.

## 2011-10-27 NOTE — Telephone Encounter (Signed)
Norco rx called to [facility].

## 2011-10-28 ENCOUNTER — Other Ambulatory Visit: Payer: PRIVATE HEALTH INSURANCE

## 2011-11-02 ENCOUNTER — Encounter (HOSPITAL_BASED_OUTPATIENT_CLINIC_OR_DEPARTMENT_OTHER): Payer: PRIVATE HEALTH INSURANCE

## 2011-11-10 ENCOUNTER — Ambulatory Visit (INDEPENDENT_AMBULATORY_CARE_PROVIDER_SITE_OTHER): Payer: PRIVATE HEALTH INSURANCE | Admitting: Ophthalmology

## 2011-11-10 DIAGNOSIS — H2510 Age-related nuclear cataract, unspecified eye: Secondary | ICD-10-CM

## 2011-11-10 DIAGNOSIS — E11359 Type 2 diabetes mellitus with proliferative diabetic retinopathy without macular edema: Secondary | ICD-10-CM

## 2011-11-10 DIAGNOSIS — E11319 Type 2 diabetes mellitus with unspecified diabetic retinopathy without macular edema: Secondary | ICD-10-CM

## 2011-11-10 DIAGNOSIS — H43819 Vitreous degeneration, unspecified eye: Secondary | ICD-10-CM

## 2011-11-10 DIAGNOSIS — E1139 Type 2 diabetes mellitus with other diabetic ophthalmic complication: Secondary | ICD-10-CM

## 2011-11-10 LAB — GLUCOSE, CAPILLARY
Glucose-Capillary: 199 mg/dL — ABNORMAL HIGH (ref 70–99)
Glucose-Capillary: 219 mg/dL — ABNORMAL HIGH (ref 70–99)

## 2011-11-11 ENCOUNTER — Encounter (HOSPITAL_BASED_OUTPATIENT_CLINIC_OR_DEPARTMENT_OTHER): Payer: PRIVATE HEALTH INSURANCE | Attending: Internal Medicine

## 2011-11-11 DIAGNOSIS — Z79899 Other long term (current) drug therapy: Secondary | ICD-10-CM | POA: Insufficient documentation

## 2011-11-11 DIAGNOSIS — E7800 Pure hypercholesterolemia, unspecified: Secondary | ICD-10-CM | POA: Insufficient documentation

## 2011-11-11 DIAGNOSIS — L97509 Non-pressure chronic ulcer of other part of unspecified foot with unspecified severity: Secondary | ICD-10-CM | POA: Insufficient documentation

## 2011-11-11 DIAGNOSIS — Z7982 Long term (current) use of aspirin: Secondary | ICD-10-CM | POA: Insufficient documentation

## 2011-11-11 DIAGNOSIS — S98139A Complete traumatic amputation of one unspecified lesser toe, initial encounter: Secondary | ICD-10-CM | POA: Insufficient documentation

## 2011-11-11 DIAGNOSIS — E1169 Type 2 diabetes mellitus with other specified complication: Secondary | ICD-10-CM | POA: Insufficient documentation

## 2011-11-11 LAB — GLUCOSE, CAPILLARY: Glucose-Capillary: 248 mg/dL — ABNORMAL HIGH (ref 70–99)

## 2011-11-13 ENCOUNTER — Encounter (HOSPITAL_BASED_OUTPATIENT_CLINIC_OR_DEPARTMENT_OTHER): Payer: PRIVATE HEALTH INSURANCE

## 2011-11-16 LAB — GLUCOSE, CAPILLARY
Glucose-Capillary: 238 mg/dL — ABNORMAL HIGH (ref 70–99)
Glucose-Capillary: 322 mg/dL — ABNORMAL HIGH (ref 70–99)

## 2011-11-17 LAB — GLUCOSE, CAPILLARY
Glucose-Capillary: 318 mg/dL — ABNORMAL HIGH (ref 70–99)
Glucose-Capillary: 196 mg/dL — ABNORMAL HIGH (ref 70–99)

## 2011-11-18 ENCOUNTER — Ambulatory Visit (INDEPENDENT_AMBULATORY_CARE_PROVIDER_SITE_OTHER): Payer: PRIVATE HEALTH INSURANCE | Admitting: Internal Medicine

## 2011-11-18 ENCOUNTER — Encounter: Payer: Self-pay | Admitting: Internal Medicine

## 2011-11-18 VITALS — BP 149/86 | HR 87 | Temp 97.1°F | Ht 78.0 in | Wt >= 6400 oz

## 2011-11-18 DIAGNOSIS — E782 Mixed hyperlipidemia: Secondary | ICD-10-CM

## 2011-11-18 DIAGNOSIS — G5790 Unspecified mononeuropathy of unspecified lower limb: Secondary | ICD-10-CM

## 2011-11-18 DIAGNOSIS — R609 Edema, unspecified: Secondary | ICD-10-CM

## 2011-11-18 DIAGNOSIS — G988 Other disorders of nervous system: Secondary | ICD-10-CM

## 2011-11-18 DIAGNOSIS — I10 Essential (primary) hypertension: Secondary | ICD-10-CM

## 2011-11-18 DIAGNOSIS — E1149 Type 2 diabetes mellitus with other diabetic neurological complication: Secondary | ICD-10-CM

## 2011-11-18 DIAGNOSIS — E785 Hyperlipidemia, unspecified: Secondary | ICD-10-CM

## 2011-11-18 LAB — COMPLETE METABOLIC PANEL WITH GFR
AST: 21 U/L (ref 0–37)
Albumin: 4 g/dL (ref 3.5–5.2)
Alkaline Phosphatase: 144 U/L — ABNORMAL HIGH (ref 39–117)
BUN: 16 mg/dL (ref 6–23)
Creat: 1.42 mg/dL — ABNORMAL HIGH (ref 0.50–1.35)
Potassium: 4.4 mEq/L (ref 3.5–5.3)

## 2011-11-18 LAB — GLUCOSE, CAPILLARY: Glucose-Capillary: 193 mg/dL — ABNORMAL HIGH (ref 70–99)

## 2011-11-18 MED ORDER — ENALAPRIL MALEATE 2.5 MG PO TABS: 2.5000 mg | ORAL_TABLET | Freq: Every day | ORAL | Status: DC

## 2011-11-18 MED ORDER — INSULIN ASPART 100 UNIT/ML ~~LOC~~ SOLN: SUBCUTANEOUS | Status: DC

## 2011-11-18 NOTE — Assessment & Plan Note (Signed)
Assessment: Patient's edema has improved on the increased dose of furosemide 40 mg each a.m. and 20 mg each p.m.  Plan: Continue current dose of furosemide; check a metabolic panel today.

## 2011-11-18 NOTE — Patient Instructions (Signed)
Increase NovoLog insulin to a dose of 56 units before breakfast, 58 units before lunch, and 56 units before dinner, plus 1 unit for every 10 mg/dl above 725 mg/dl. Start enalapril 2.5 mg one tablet daily for high blood pressure. Followup at the wound clinic as scheduled.

## 2011-11-18 NOTE — Assessment & Plan Note (Signed)
Lab Results  Component Value Date   NA 138 10/08/2011   K 4.4 10/08/2011   CL 100 10/08/2011   CO2 27 10/08/2011   BUN 18 10/08/2011   CREATININE 1.38* 10/08/2011    BP Readings from Last 3 Encounters:  11/18/11 149/86  09/16/11 153/95  08/05/11 148/90    Assessment: Patient's blood pressure has been elevated on last 3 visits; I added a new diagnosis of hypertension to his problem list.    Plan:  Start enalapril 2.5 mg daily.  I discussed potential side effects with the patient and advised him tp let me know if he has any problems with this medication.

## 2011-11-18 NOTE — Assessment & Plan Note (Signed)
Lipids:    Component Value Date/Time   CHOL 158 10/08/2011 0857   TRIG 361* 10/08/2011 0857   HDL 24* 10/08/2011 0857   LDLCALC 62 10/08/2011 0857   VLDL 72* 10/08/2011 0857   CHOLHDL 6.6 10/08/2011 0857    Assessment: LDL is at goal on current dose of atorvastatin 20 mg daily.  Hopefully his triglycerides will improve as we improve control of his diabetes mellitus.  Plan: Continue atorvastatin at current dose.

## 2011-11-18 NOTE — Progress Notes (Signed)
  Subjective:    Patient ID: [name], male    DOB: [birth year], 61 y.o.   MRN: [id number]  HPI Patient presents for followup of his diabetes mellitus, hypertension, leg edema, and other chronic medical problems.  He is now being followed again at the wound clinic for a left foot wound which developed in the area of his large callus; he is currently on doxycycline 100 mg twice a day prescribed at the wound clinic, and is also getting hyperbaric treatments.  He reports that the wound is healing with these treatments.  He reports that he is scheduled to see an ENT physician today to have ear tubes placed because he is having trouble during the hyperbaric treatments.  He reports that he increased his insulin as directed following his last visit, and thatf his blood sugars have improved a little although his values are still in the 100s and 200s; he did not bring his meter or his blood sugar log to clinic today.  He reports that his highest blood sugar values are before breakfast, often greater than 200; he reports no low blood sugars.  Patient reports that his bilateral leg edema has improved following the Lasix dose increase, although he still has some leg edema.  Patient also reports chronic stable exertional dyspnea, and he reports that the albuterol inhaler helps to some extent with that.   Review of Systems  Constitutional: Negative for fever, chills and diaphoresis.  Cardiovascular: Positive for leg swelling (Improved following increase in Lasix dose). Negative for chest pain.  Gastrointestinal: Negative for nausea, vomiting, abdominal pain, blood in stool and anal bleeding.       Objective:   Physical Exam  Constitutional: No distress.  Cardiovascular: Normal rate, regular rhythm and normal heart sounds.  Exam reveals no gallop and no friction rub.   No murmur heard. Pulmonary/Chest: Effort normal and breath sounds normal. No respiratory distress. He has no wheezes. He has no rales.    Musculoskeletal: He exhibits edema (1+ bilateral lower extremity edema.).       Assessment & Plan:

## 2011-11-18 NOTE — Assessment & Plan Note (Signed)
Assessment: The patient stopped his gabapentin in March because he felt it was making him dizzy.  He reports that his dizziness resolved, and he has had no worsening of his neuropathic pain symptoms.  Plan: I removed gabapentin from his medication list.  He will continue using hydrocodone/acetaminophen at current dose.

## 2011-11-18 NOTE — Assessment & Plan Note (Addendum)
Hemoglobin A1C  Date Value Range Status  09/16/2011 8.5   Final  06/08/2011 8.6   Final     Creat  Date Value Range Status  10/08/2011 1.38* 0.50-1.35 (mg/dL) Final  1/61/0960 4.54* 0.50-1.35 (mg/dL) Final     LDL Cholesterol  Date Value Range Status  10/08/2011 62  0-99 (mg/dL) Final  0/98/1191 39  4-78 (mg/dL) Final     See lab report for associated comment(s)    Assessment: Diabetes control: not controlled Progress toward goals: improved Barriers to meeting goals: no barriers identified  Plan: Increase NovoLog insulin to a dose of 56 units before breakfast, 58 units before lunch, and 56 units before dinner, plus 1 unit for every 10 mg/dl above 295 mg/dl; continue Lantus at current dose of 135 units daily.  I advised patient to bring his meter and log to his next visit Instruction/counseling given: reminded to bring blood glucose meter & log to each visit

## 2011-11-19 LAB — GLUCOSE, CAPILLARY
Glucose-Capillary: 236 mg/dL — ABNORMAL HIGH (ref 70–99)
Glucose-Capillary: 174 mg/dL — ABNORMAL HIGH (ref 70–99)

## 2011-11-20 LAB — GLUCOSE, CAPILLARY: Glucose-Capillary: 190 mg/dL — ABNORMAL HIGH (ref 70–99)

## 2011-11-25 LAB — GLUCOSE, CAPILLARY
Glucose-Capillary: 136 mg/dL — ABNORMAL HIGH (ref 70–99)
Glucose-Capillary: 85 mg/dL (ref 70–99)
Glucose-Capillary: 182 mg/dL — ABNORMAL HIGH (ref 70–99)

## 2011-11-26 LAB — GLUCOSE, CAPILLARY: Glucose-Capillary: 181 mg/dL — ABNORMAL HIGH (ref 70–99)

## 2011-11-27 LAB — GLUCOSE, CAPILLARY: Glucose-Capillary: 188 mg/dL — ABNORMAL HIGH (ref 70–99)

## 2011-11-30 ENCOUNTER — Telehealth: Payer: Self-pay | Admitting: Dietician

## 2011-11-30 LAB — GLUCOSE, CAPILLARY: Glucose-Capillary: 216 mg/dL — ABNORMAL HIGH (ref 70–99)

## 2011-11-30 NOTE — Telephone Encounter (Signed)
Able to order free medicine through [facility] adn wants to know if he is okay taking "Pharbetol".

## 2011-11-30 NOTE — Telephone Encounter (Signed)
Discussed with Dr. Meredith Pel who advised patient not to take any other medicines other than his prescribed medications. Called patient and left message with this advice.

## 2011-12-01 LAB — GLUCOSE, CAPILLARY
Glucose-Capillary: 188 mg/dL — ABNORMAL HIGH (ref 70–99)
Glucose-Capillary: 122 mg/dL — ABNORMAL HIGH (ref 70–99)

## 2011-12-02 LAB — GLUCOSE, CAPILLARY
Glucose-Capillary: 259 mg/dL — ABNORMAL HIGH (ref 70–99)
Glucose-Capillary: 140 mg/dL — ABNORMAL HIGH (ref 70–99)

## 2011-12-03 LAB — GLUCOSE, CAPILLARY: Glucose-Capillary: 220 mg/dL — ABNORMAL HIGH (ref 70–99)

## 2011-12-04 LAB — GLUCOSE, CAPILLARY: Glucose-Capillary: 144 mg/dL — ABNORMAL HIGH (ref 70–99)

## 2011-12-10 ENCOUNTER — Encounter: Payer: PRIVATE HEALTH INSURANCE | Admitting: Internal Medicine

## 2011-12-10 ENCOUNTER — Ambulatory Visit (HOSPITAL_COMMUNITY)
Admission: RE | Admit: 2011-12-10 | Discharge: 2011-12-10 | Disposition: A | Discharge status: Home / Self Care | Payer: PRIVATE HEALTH INSURANCE | Source: Ambulatory Visit | Attending: Internal Medicine | Admitting: Internal Medicine

## 2011-12-10 ENCOUNTER — Ambulatory Visit (INDEPENDENT_AMBULATORY_CARE_PROVIDER_SITE_OTHER): Payer: PRIVATE HEALTH INSURANCE | Admitting: Internal Medicine

## 2011-12-10 DIAGNOSIS — R109 Unspecified abdominal pain: Secondary | ICD-10-CM | POA: Insufficient documentation

## 2011-12-10 DIAGNOSIS — R0602 Shortness of breath: Secondary | ICD-10-CM | POA: Insufficient documentation

## 2011-12-10 LAB — COMPREHENSIVE METABOLIC PANEL
AST: 22 U/L (ref 0–37)
Albumin: 3.9 g/dL (ref 3.5–5.2)
Alkaline Phosphatase: 127 U/L — ABNORMAL HIGH (ref 39–117)
Calcium: 8.8 mg/dL (ref 8.4–10.5)
Chloride: 104 mEq/L (ref 96–112)
Glucose, Bld: 157 mg/dL — ABNORMAL HIGH (ref 70–99)
Potassium: 4.4 mEq/L (ref 3.5–5.3)
Sodium: 138 mEq/L (ref 135–145)
Total Protein: 6.8 g/dL (ref 6.0–8.3)

## 2011-12-10 LAB — CBC WITH DIFFERENTIAL/PLATELET
Basophils Absolute: 0 10*3/uL (ref 0.0–0.1)
Basophils Relative: 0 % (ref 0–1)
Eosinophils Absolute: 0.2 10*3/uL (ref 0.0–0.7)
Eosinophils Relative: 3 % (ref 0–5)
Lymphs Abs: 1.8 10*3/uL (ref 0.7–4.0)
MCH: 28.1 pg (ref 26.0–34.0)
MCHC: 32.5 g/dL (ref 30.0–36.0)
MCV: 86.6 fL (ref 78.0–100.0)
Neutrophils Relative %: 56 % (ref 43–77)
Platelets: 146 10*3/uL — ABNORMAL LOW (ref 150–400)
RDW: 15.5 % (ref 11.5–15.5)

## 2011-12-10 MED ORDER — PROMETHAZINE HCL 25 MG PO TABS: 12.5000 mg | ORAL_TABLET | Freq: Four times a day (QID) | ORAL | Status: DC | PRN

## 2011-12-10 NOTE — Progress Notes (Signed)
Patient ID: COLTYN HANNING, male   DOB: 1951-02-06, 61 y.o.   MRN: [id number]  Subjective:     HPI: Patient is a 61 y/o M here today for complaint of abdominal pain.  Patient describes 2 weeks of diffuse, central abdominal pain characterized as intermittent aching and cramping pain.  He has not noticed any precipitating/worsening factors.  He does not feel his symptoms are related to food intake.  He admits to associated nausea.  He denies emesis, diarrhea, constipation, BRBPR, or dark black BMS.  He denies any fevers or chills.  He denies sick contact.  He denies any difficulty swallowing, indigestion, or heartburn.  He currently takes omeprazole for GERD and states this controls his symptoms well.  He has experienced similar pain in the past.     Review of Systems Constitutional: Negative for fever, chills, diaphoresis, activity change, appetite change, fatigue and unexpected weight change.  HENT: Negative for hearing loss, congestion and neck stiffness.   Eyes: Negative for photophobia, pain and visual disturbance.  Respiratory: Negative for cough, chest tightness, shortness of breath and wheezing.   Cardiovascular: Negative for chest pain and palpitations.  Gastrointestinal: Negative for abdominal pain, blood in stool and anal bleeding.  Genitourinary: Negative for dysuria, hematuria and difficulty urinating.  Musculoskeletal: Negative for joint swelling.  Neurological: Negative for dizziness, syncope, speech difficulty, weakness, numbness and headaches.      Objective:   Physical Exam VItal signs reviewed and stable. GEN: No apparent distress.  Alert and oriented x 3.  Pleasant, conversant, and cooperative to exam. HEENT: head is autraumatic and normocephalic.  Neck is supple without palpable masses or lymphadenopathy.  No JVD or carotid bruits.  Vision intact.  EOMI.  PERRLA.  Sclerae anicteric.  Conjunctivae without pallor or injection. Mucous membranes are moist.  Oropharynx is  without erythema, exudates, or other abnormal lesions.   RESP:  Lungs are clear to ascultation bilaterally with good air movement.  No wheezes, ronchi, or rubs. CARDIOVASCULAR: regular rate, normal rhythm.  Clear S1, S2, no murmurs, gallops, or rubs. ABDOMEN: obese, soft, and  non-distended.  Bowels sounds present in all quadrants and hypooactive.  No palpable masses.  Mild diffuse tenderness to palpation. No guarding or rebound. EXT: warm and dry.  Peripheral pulses equal, intact, and +2 globally.  No clubbing or cyanosis.  No edema in bilateral lower extremities. SKIN: warm and dry with normal turgor.  No rashes or abnormal lesions observed. NEURO: CN II-XII grossly intact.  Muscle strength +5/5 in bilateral upper and lower extremities.  Sensation is grossly intact.  No focal deficit.     Assessment/Plan:

## 2011-12-10 NOTE — Patient Instructions (Signed)
Phenergan is a medicine to treat nausea.  Use as directed.  This medicine may make you sleepy. If you develop any worsening symptoms, contact the clinic or go to the ER. We will obtain plain x-rays to evaluate your abdominal pain. We will also schedule you for a gastric emptying study to see if diabetes is affecting your ability to move food through your stomach and causing your discomfort and nausea.

## 2011-12-10 NOTE — Assessment & Plan Note (Addendum)
I am unsure of the etiology of patient's abdominal pain.  Gastroenteritis seems unlikely given his lack of fever, vomiting, or diarrhea.  His symptoms are not suggestive of diverticulitis or inflammatory bowel disease.  Ileus 2/2 narcotic use and/or gastroparesis seem more likely.  Will obtain a compressive metabolic panel today to assess electrolyte status and liver function. Will also check a lipase to evaluate for the possibility of pancreatitis although seems fairly unlikely. Will obtain abdominal x-rays to assess for ileus and also schedule patient for gastric emptying study to evaluate for underlying gastroparesis. Patient is advised to return to the clinic or to go to the emergency room should he develop worsening abdominal pain, bloody bowel movements, fever, chills, or other concerning symptoms. wi provided with a prescription for Phenergan to be used for symptomatically with of associated nausea.  Patient is advised to continue taking prilosec daily,

## 2011-12-14 ENCOUNTER — Encounter (HOSPITAL_BASED_OUTPATIENT_CLINIC_OR_DEPARTMENT_OTHER): Payer: PRIVATE HEALTH INSURANCE | Attending: General Surgery

## 2011-12-14 DIAGNOSIS — E1169 Type 2 diabetes mellitus with other specified complication: Secondary | ICD-10-CM | POA: Insufficient documentation

## 2011-12-14 DIAGNOSIS — L97509 Non-pressure chronic ulcer of other part of unspecified foot with unspecified severity: Secondary | ICD-10-CM | POA: Insufficient documentation

## 2011-12-14 DIAGNOSIS — S98139A Complete traumatic amputation of one unspecified lesser toe, initial encounter: Secondary | ICD-10-CM | POA: Insufficient documentation

## 2011-12-14 DIAGNOSIS — Z8614 Personal history of Methicillin resistant Staphylococcus aureus infection: Secondary | ICD-10-CM | POA: Insufficient documentation

## 2011-12-14 DIAGNOSIS — Z79899 Other long term (current) drug therapy: Secondary | ICD-10-CM | POA: Insufficient documentation

## 2011-12-14 LAB — GLUCOSE, CAPILLARY: Glucose-Capillary: 198 mg/dL — ABNORMAL HIGH (ref 70–99)

## 2011-12-15 ENCOUNTER — Encounter (HOSPITAL_COMMUNITY)
Admission: RE | Admit: 2011-12-15 | Discharge: 2011-12-15 | Disposition: A | Discharge status: Home / Self Care | Payer: PRIVATE HEALTH INSURANCE | Source: Ambulatory Visit | Attending: Internal Medicine | Admitting: Internal Medicine

## 2011-12-15 DIAGNOSIS — R110 Nausea: Secondary | ICD-10-CM | POA: Insufficient documentation

## 2011-12-15 DIAGNOSIS — R109 Unspecified abdominal pain: Secondary | ICD-10-CM | POA: Insufficient documentation

## 2011-12-15 LAB — GLUCOSE, CAPILLARY: Glucose-Capillary: 221 mg/dL — ABNORMAL HIGH (ref 70–99)

## 2011-12-15 MED ORDER — TECHNETIUM TC 99M SULFUR COLLOID
2.0000 | Freq: Once | INTRAVENOUS | Status: AC | PRN
Start: 2011-12-15 — End: 2011-12-15
  Administered 2011-12-15: 2 via INTRAVENOUS

## 2011-12-16 ENCOUNTER — Encounter (HOSPITAL_BASED_OUTPATIENT_CLINIC_OR_DEPARTMENT_OTHER): Payer: PRIVATE HEALTH INSURANCE

## 2011-12-17 LAB — GLUCOSE, CAPILLARY: Glucose-Capillary: 230 mg/dL — ABNORMAL HIGH (ref 70–99)

## 2011-12-18 ENCOUNTER — Telehealth: Payer: Self-pay | Admitting: Dietician

## 2011-12-18 ENCOUNTER — Encounter (HOSPITAL_BASED_OUTPATIENT_CLINIC_OR_DEPARTMENT_OTHER): Payer: PRIVATE HEALTH INSURANCE

## 2011-12-18 LAB — GLUCOSE, CAPILLARY
Glucose-Capillary: 212 mg/dL — ABNORMAL HIGH (ref 70–99)
Glucose-Capillary: 85 mg/dL (ref 70–99)

## 2011-12-18 NOTE — Telephone Encounter (Signed)
Spoke with nurse. She reports papers in in doctor's box.

## 2011-12-18 NOTE — Telephone Encounter (Signed)
Patient says he just left wound center, his wound is healing and they asked him to get his diabetes shoes as soon as possible. He asked CDE to follow up on paperwork for shoes that we need to fax to [employer]

## 2011-12-21 LAB — GLUCOSE, CAPILLARY
Glucose-Capillary: 115 mg/dL — ABNORMAL HIGH (ref 70–99)
Glucose-Capillary: 131 mg/dL — ABNORMAL HIGH (ref 70–99)
Glucose-Capillary: 112 mg/dL — ABNORMAL HIGH (ref 70–99)

## 2011-12-23 ENCOUNTER — Ambulatory Visit (INDEPENDENT_AMBULATORY_CARE_PROVIDER_SITE_OTHER): Payer: PRIVATE HEALTH INSURANCE | Admitting: Internal Medicine

## 2011-12-23 ENCOUNTER — Encounter: Payer: Self-pay | Admitting: Internal Medicine

## 2011-12-23 VITALS — BP 154/86 | HR 88 | Temp 97.8°F | Ht 78.0 in | Wt >= 6400 oz

## 2011-12-23 DIAGNOSIS — R109 Unspecified abdominal pain: Secondary | ICD-10-CM

## 2011-12-23 DIAGNOSIS — I10 Essential (primary) hypertension: Secondary | ICD-10-CM

## 2011-12-23 DIAGNOSIS — IMO0001 Reserved for inherently not codable concepts without codable children: Secondary | ICD-10-CM

## 2011-12-23 DIAGNOSIS — Z79899 Other long term (current) drug therapy: Secondary | ICD-10-CM

## 2011-12-23 LAB — POCT GLYCOSYLATED HEMOGLOBIN (HGB A1C): Hemoglobin A1C: 8.3

## 2011-12-23 LAB — GLUCOSE, CAPILLARY: Glucose-Capillary: 67 mg/dL — ABNORMAL LOW (ref 70–99)

## 2011-12-23 MED ORDER — ENALAPRIL MALEATE 2.5 MG PO TABS: 5.0000 mg | ORAL_TABLET | Freq: Every day | ORAL | Status: DC

## 2011-12-23 MED ORDER — DOCUSATE SODIUM 100 MG PO CAPS: 100.0000 mg | ORAL_CAPSULE | Freq: Two times a day (BID) | ORAL | Status: AC

## 2011-12-23 NOTE — Patient Instructions (Signed)
1.  Increase enalapril 2.5 mg to a dose of 2 tablets once a day. 2.  Start docusate (Colace) 100 mg one capsule twice a day as a stool softener.

## 2011-12-23 NOTE — Assessment & Plan Note (Signed)
Lab Results  Component Value Date   NA 138 12/10/2011   K 4.4 12/10/2011   CL 104 12/10/2011   CO2 26 12/10/2011   BUN 18 12/10/2011   CREATININE 1.59* 12/10/2011    BP Readings from Last 3 Encounters:  12/23/11 154/86  12/10/11 135/86  11/18/11 149/86    Assessment: Hypertension control:  mildly elevated  Progress toward goals:  deteriorated Barriers to meeting goals:  no barriers identified  Plan: Hypertension treatment:  Increase enalapril to a dose of 5 mg daily; continue other medications at current dose.

## 2011-12-23 NOTE — Assessment & Plan Note (Signed)
Hemoglobin A1C  Date Value Range Status  12/23/2011 8.3   Final  09/16/2011 8.5   Final  06/08/2011 8.6   Final     Assessment: Diabetes control: not controlled Progress toward goals: improved Barriers to meeting goals: lack of understanding of disease management  Plan: Diabetes treatment: continue current medications Refer to: none Instruction/counseling given: discussed diet

## 2011-12-23 NOTE — Progress Notes (Signed)
  Subjective:    Patient ID: [name], male    DOB: April 05, 1951, 61 y.o.   MRN: [id number]  HPI Patient returns today for followup of his diabetes mellitus, abdominal pain, hypertension, and other medical problems.  He was seen in our clinic on 12/10/2011 with complaint of abdominal pain of unclear etiology.  A gastric emptying study was ordered then, and returned normal.  Patient reports occasional brief episodes of generalized abdominal pain; these occur once every few days, last for a few minutes, and then resolve.  He has no associated symptoms; he denies nausea, vomiting, bright red blood per rectum, or melena.  He does report some constipation.  He has no abdominal pain today.  Regarding his blood sugars, his average value has been 205 over the past month.  His blood sugar was low on arrival to clinic today, and he says that he skipped lunch.  He reports that his foot wound has essentially healed, and he has a few remaining hyperbaric treatments.   Review of Systems  Constitutional: Negative for fever, chills and diaphoresis.  Respiratory: Positive for shortness of breath (Stable chronic exertional dyspnea).   Cardiovascular: Negative for chest pain and leg swelling.  Gastrointestinal: Positive for abdominal pain and constipation. Negative for nausea, vomiting, diarrhea and blood in stool.  Genitourinary: Negative for dysuria and frequency.       Objective:   Physical Exam  Constitutional: No distress.  Cardiovascular: Normal rate, regular rhythm and normal heart sounds.  Exam reveals no gallop and no friction rub.   No murmur heard. Pulmonary/Chest: Effort normal and breath sounds normal. No respiratory distress. He has no wheezes. He has no rales.  Abdominal: Soft. Bowel sounds are normal. He exhibits no distension. There is no tenderness. There is no rebound and no guarding.  Musculoskeletal: He exhibits no edema.  Skin: He is not diaphoretic.        Assessment & Plan:

## 2011-12-23 NOTE — Assessment & Plan Note (Signed)
Assessment: The cause of patient's abdominal pain is not clear.  The pain is intermittent, occurs once every few days, and resolves after a few minutes.  Labs done at the time of previous visit were unrevealing; a gastric emptying study was normal.  Patient has no abdominal pain today.  His pain may be due to constipation, given the fact that he is on a chronic opioid for pain management.  Plan: Plan is to treat with Colace 100 mg twice a day as a stool softener.  I advised patient to return right away if he has increased frequency or severity of his pain or any new associated symptoms.  Otherwise will have him return in 3 weeks for reassessment.

## 2011-12-24 ENCOUNTER — Other Ambulatory Visit: Payer: Self-pay | Admitting: *Deleted

## 2011-12-24 MED ORDER — HYDROCODONE-ACETAMINOPHEN 5-325 MG PO TABS: ORAL_TABLET | ORAL | Status: DC

## 2011-12-24 NOTE — Telephone Encounter (Signed)
Refill approved - nurse to complete. 

## 2011-12-25 LAB — GLUCOSE, CAPILLARY
Glucose-Capillary: 198 mg/dL — ABNORMAL HIGH (ref 70–99)
Glucose-Capillary: 147 mg/dL — ABNORMAL HIGH (ref 70–99)
Glucose-Capillary: 143 mg/dL — ABNORMAL HIGH (ref 70–99)

## 2011-12-28 LAB — GLUCOSE, CAPILLARY: Glucose-Capillary: 138 mg/dL — ABNORMAL HIGH (ref 70–99)

## 2011-12-28 NOTE — Telephone Encounter (Signed)
Rx called in to pharmacy. 

## 2011-12-30 LAB — GLUCOSE, CAPILLARY
Glucose-Capillary: 154 mg/dL — ABNORMAL HIGH (ref 70–99)
Glucose-Capillary: 75 mg/dL (ref 70–99)

## 2011-12-31 LAB — GLUCOSE, CAPILLARY: Glucose-Capillary: 142 mg/dL — ABNORMAL HIGH (ref 70–99)

## 2012-01-05 ENCOUNTER — Other Ambulatory Visit: Payer: Self-pay | Admitting: *Deleted

## 2012-01-05 ENCOUNTER — Other Ambulatory Visit: Payer: Self-pay | Admitting: Internal Medicine

## 2012-01-05 DIAGNOSIS — IMO0001 Reserved for inherently not codable concepts without codable children: Secondary | ICD-10-CM

## 2012-01-05 MED ORDER — INSULIN GLARGINE 100 UNIT/ML ~~LOC~~ SOLN: 155.0000 [IU] | Freq: Every day | SUBCUTANEOUS | Status: DC

## 2012-01-05 NOTE — Telephone Encounter (Signed)
Duplicate request for insulin refill.

## 2012-01-06 NOTE — Telephone Encounter (Signed)
Rx called into pharmacy - pt aware. Stanton Kidney Emani Taussig RN 01/06/12 12N

## 2012-01-12 ENCOUNTER — Telehealth: Payer: Self-pay | Admitting: *Deleted

## 2012-01-12 DIAGNOSIS — K056 Periodontal disease, unspecified: Secondary | ICD-10-CM

## 2012-01-12 MED ORDER — GELCLAIR MT GEL: OROMUCOSAL | Status: DC

## 2012-01-12 NOTE — Telephone Encounter (Signed)
Pt is asking for Mucosal Barrier Oral Gel. He has gotten in the past.  # 30 pack

## 2012-01-20 ENCOUNTER — Other Ambulatory Visit: Payer: Self-pay | Admitting: *Deleted

## 2012-01-20 DIAGNOSIS — K056 Periodontal disease, unspecified: Secondary | ICD-10-CM

## 2012-01-20 NOTE — Telephone Encounter (Signed)
Note from pharmacy stating gelclair is discontinued by manufacturer.  Can you change to something else?

## 2012-01-26 ENCOUNTER — Other Ambulatory Visit: Payer: Self-pay | Admitting: *Deleted

## 2012-01-26 NOTE — Telephone Encounter (Signed)
Pt has appointment 7/31 with Dr Meredith Pel. Refill of this med not available.

## 2012-01-26 NOTE — Telephone Encounter (Signed)
One additional refill was not received by pharmacy.  Called in vicodin order from 6/13.

## 2012-01-28 ENCOUNTER — Other Ambulatory Visit: Payer: Self-pay | Admitting: Internal Medicine

## 2012-02-02 MED ORDER — MUGARD MT LIQD: OROMUCOSAL | Status: DC

## 2012-02-02 NOTE — Telephone Encounter (Signed)
I found an alternative to Gelclair called MuGard; patient should rinse his mouth with 1-2 teaspoonfuls four times a day as needed for sore gums.  Please let patient know. I sent the prescription electronically.

## 2012-02-02 NOTE — Addendum Note (Signed)
Addended by: Margarito Liner on: 02/02/2012 04:47 PM   Modules accepted: Orders

## 2012-02-03 ENCOUNTER — Other Ambulatory Visit: Payer: Self-pay | Admitting: *Deleted

## 2012-02-03 MED ORDER — OMEPRAZOLE 20 MG PO CPDR: 20.0000 mg | DELAYED_RELEASE_CAPSULE | Freq: Every day | ORAL | Status: DC

## 2012-02-03 NOTE — Telephone Encounter (Signed)
i spoke w/ pt and he understands that there is nothing that could be ordered at this time that is low in cost

## 2012-02-03 NOTE — Telephone Encounter (Signed)
Dr Meredith Pel, i called [facility] the new script will cost [facility] $300.00, i doubt that this will work

## 2012-02-03 NOTE — Telephone Encounter (Signed)
Spoke w/ dr Meredith Pel and did more research, nothing except lidocaine viscus available diluted and available for appr $9.99 per . Will inform pt that there is nothing very similar to the first 2 ordered at this time.

## 2012-02-10 ENCOUNTER — Encounter: Payer: Self-pay | Admitting: Internal Medicine

## 2012-02-10 ENCOUNTER — Ambulatory Visit (INDEPENDENT_AMBULATORY_CARE_PROVIDER_SITE_OTHER): Payer: PRIVATE HEALTH INSURANCE | Admitting: Internal Medicine

## 2012-02-10 VITALS — BP 151/91 | HR 81 | Temp 97.3°F | Ht 72.6 in | Wt >= 6400 oz

## 2012-02-10 DIAGNOSIS — I10 Essential (primary) hypertension: Secondary | ICD-10-CM

## 2012-02-10 DIAGNOSIS — F528 Other sexual dysfunction not due to a substance or known physiological condition: Secondary | ICD-10-CM

## 2012-02-10 DIAGNOSIS — I129 Hypertensive chronic kidney disease with stage 1 through stage 4 chronic kidney disease, or unspecified chronic kidney disease: Secondary | ICD-10-CM

## 2012-02-10 DIAGNOSIS — N189 Chronic kidney disease, unspecified: Secondary | ICD-10-CM

## 2012-02-10 DIAGNOSIS — N529 Male erectile dysfunction, unspecified: Secondary | ICD-10-CM

## 2012-02-10 DIAGNOSIS — D649 Anemia, unspecified: Secondary | ICD-10-CM

## 2012-02-10 DIAGNOSIS — K219 Gastro-esophageal reflux disease without esophagitis: Secondary | ICD-10-CM

## 2012-02-10 DIAGNOSIS — R109 Unspecified abdominal pain: Secondary | ICD-10-CM

## 2012-02-10 LAB — CBC WITH DIFFERENTIAL/PLATELET
Basophils Relative: 0 % (ref 0–1)
Eosinophils Absolute: 0.2 10*3/uL (ref 0.0–0.7)
Eosinophils Relative: 4 % (ref 0–5)
HCT: 36.5 % — ABNORMAL LOW (ref 39.0–52.0)
Hemoglobin: 11.4 g/dL — ABNORMAL LOW (ref 13.0–17.0)
Lymphs Abs: 1.3 10*3/uL (ref 0.7–4.0)
MCH: 27.9 pg (ref 26.0–34.0)
MCHC: 31.2 g/dL (ref 30.0–36.0)
MCV: 89.5 fL (ref 78.0–100.0)
Monocytes Absolute: 0.5 10*3/uL (ref 0.1–1.0)
Monocytes Relative: 10 % (ref 3–12)
Neutrophils Relative %: 60 % (ref 43–77)
RBC: 4.08 MIL/uL — ABNORMAL LOW (ref 4.22–5.81)

## 2012-02-10 LAB — FERRITIN: Ferritin: 230 ng/mL (ref 22–322)

## 2012-02-10 LAB — GLUCOSE, CAPILLARY: Glucose-Capillary: 257 mg/dL — ABNORMAL HIGH (ref 70–99)

## 2012-02-10 MED ORDER — ENALAPRIL MALEATE 2.5 MG PO TABS: 7.5000 mg | ORAL_TABLET | Freq: Every day | ORAL | Status: DC

## 2012-02-10 MED ORDER — INSULIN ASPART 100 UNIT/ML ~~LOC~~ SOLN: SUBCUTANEOUS | Status: DC

## 2012-02-10 MED ORDER — SILDENAFIL CITRATE 50 MG PO TABS: 50.0000 mg | ORAL_TABLET | Freq: Every day | ORAL | Status: DC | PRN

## 2012-02-10 NOTE — Assessment & Plan Note (Signed)
Assessment: Patient's abdominal pain has resolved completely.  Plan: I advised patient to call or return if his symptoms recur.

## 2012-02-10 NOTE — Assessment & Plan Note (Signed)
Assessment: Symptoms are well controlled on omeprazole 20 mg daily.  Plan:  Continue omeprazole at current dose.

## 2012-02-10 NOTE — Assessment & Plan Note (Signed)
Creat  Date Value Range Status  12/10/2011 1.59* 0.50 - 1.35 mg/dL Final  07/18/1094 0.45* 4.09 - 1.35 mg/dL Final  02/20/9146 8.29* 0.50 - 1.35 mg/dL Final    Assessment: Patient's creatinine increased slightly from March to May, but is in the range of past values.  Plan: Check a metabolic panel today.

## 2012-02-10 NOTE — Assessment & Plan Note (Signed)
Lab Results  Component Value Date   WBC 5.3 12/10/2011   HGB 11.5* 12/10/2011   HCT 35.4* 12/10/2011   MCV 86.6 12/10/2011   PLT 146* 12/10/2011    Assessment: CBC done in May showed a mild normocytic anemia.  Patient is asymptomatic.  Plan:  Repeat CBC and obtain iron studies today.

## 2012-02-10 NOTE — Assessment & Plan Note (Signed)
Assessment: Patient reports good response to Viagra in the past, although he has some difficulty affording that medication.  He reports no apparent side effects, and requested a refill.  Plan: Refill Viagra at previous dose.

## 2012-02-10 NOTE — Progress Notes (Signed)
  Subjective:    Patient ID: [name], male    DOB: [birth year], 61 y.o.   MRN: [id number]  HPI Patient returns for followup of his abdominal pain, diabetes mellitus, and other chronic medical problems.  Today he reports that his abdominal pain resolved following his last visit here, and he currently has no abdominal pain or other GI symptoms.  His home blood glucose record for the past month shows a range from 97 to 246 before breakfast, 65 to 263 before lunch, and 62 to 233 before dinner.  He reports that he has been compliant with his medications.  He is somewhat sad today because a nephew of his was killed over the weekend.   Review of Systems  Cardiovascular: Negative for chest pain.  Gastrointestinal: Negative for nausea, vomiting and abdominal pain.       Objective:   Physical Exam  Constitutional: No distress.  Cardiovascular: Normal rate, regular rhythm and normal heart sounds.  Exam reveals no gallop and no friction rub.   No murmur heard. Pulmonary/Chest: Effort normal and breath sounds normal. No respiratory distress. He has no wheezes. He has no rales.  Abdominal: Soft. Bowel sounds are normal. He exhibits no distension. There is no tenderness. There is no rebound and no guarding.  Musculoskeletal: He exhibits no edema.       Assessment & Plan:

## 2012-02-10 NOTE — Assessment & Plan Note (Signed)
Hemoglobin A1C  Date Value Range Status  12/23/2011 8.3   Final  09/16/2011 8.5   Final  06/08/2011 8.6   Final     Assessment: Diabetes control: not controlled Progress toward goals: improved Barriers to meeting goals: no barriers identified  Plan: Diabetes treatment: Increase NovoLog insulin to a dose of 58 units with breakfast, 58 units with lunch, and 56 units with supper plus previous correction insulin based on pre-meal blood sugar; continue Lantus 155 units daily. Refer to: none Instruction/counseling given: reminded to bring medications to each visit

## 2012-02-10 NOTE — Patient Instructions (Addendum)
Increase NovoLog insulin to a dose of 58 units before breakfast, 58 units before lunch, and 56 units before dinner, plus 1 unit for every 10 mg/dl above 161 mg/dl. Increase enalapril (Vasotec) 2.5 mg tablets to a dose of 3 tablets once a day. Monitor home blood sugar 3 times a day as above.

## 2012-02-10 NOTE — Assessment & Plan Note (Signed)
Lab Results  Component Value Date   NA 138 12/10/2011   K 4.4 12/10/2011   CL 104 12/10/2011   CO2 26 12/10/2011   BUN 18 12/10/2011   CREATININE 1.59* 12/10/2011    BP Readings from Last 3 Encounters:  02/10/12 151/91  12/23/11 154/86  12/10/11 135/86    Assessment: Hypertension control:  moderately elevated  Progress toward goals:  unchanged Barriers to meeting goals:  no barriers identified  Plan: Increase enalapril to a dose of 7.5 mg daily; continue furosemide at current dose of 40 mg each morning and 20 mg each evening.

## 2012-02-11 LAB — COMPLETE METABOLIC PANEL WITH GFR
ALT: 16 U/L (ref 0–53)
AST: 15 U/L (ref 0–37)
Alkaline Phosphatase: 133 U/L — ABNORMAL HIGH (ref 39–117)
CO2: 24 mEq/L (ref 19–32)
Creat: 1.24 mg/dL (ref 0.50–1.35)
Sodium: 136 mEq/L (ref 135–145)
Total Bilirubin: 0.4 mg/dL (ref 0.3–1.2)
Total Protein: 6.8 g/dL (ref 6.0–8.3)

## 2012-02-22 ENCOUNTER — Other Ambulatory Visit: Payer: Self-pay | Admitting: *Deleted

## 2012-02-22 MED ORDER — HYDROCODONE-ACETAMINOPHEN 5-325 MG PO TABS: ORAL_TABLET | ORAL | Status: DC

## 2012-02-22 NOTE — Telephone Encounter (Signed)
Last filled 7/16

## 2012-02-22 NOTE — Telephone Encounter (Signed)
Rx called in 

## 2012-02-22 NOTE — Telephone Encounter (Signed)
Refill printed and signed - nurse to complete. 

## 2012-02-22 NOTE — Telephone Encounter (Signed)
Closed

## 2012-03-22 ENCOUNTER — Other Ambulatory Visit: Payer: Self-pay | Admitting: Internal Medicine

## 2012-03-22 MED ORDER — FUROSEMIDE 20 MG PO TABS: ORAL_TABLET | ORAL | Status: DC

## 2012-04-06 ENCOUNTER — Ambulatory Visit (INDEPENDENT_AMBULATORY_CARE_PROVIDER_SITE_OTHER): Payer: PRIVATE HEALTH INSURANCE | Admitting: Internal Medicine

## 2012-04-06 ENCOUNTER — Encounter: Payer: Self-pay | Admitting: Internal Medicine

## 2012-04-06 VITALS — BP 114/68 | HR 87 | Temp 97.0°F | Ht 72.6 in | Wt 396.4 lb

## 2012-04-06 DIAGNOSIS — Z23 Encounter for immunization: Secondary | ICD-10-CM

## 2012-04-06 DIAGNOSIS — IMO0001 Reserved for inherently not codable concepts without codable children: Secondary | ICD-10-CM

## 2012-04-06 DIAGNOSIS — E782 Mixed hyperlipidemia: Secondary | ICD-10-CM

## 2012-04-06 DIAGNOSIS — I10 Essential (primary) hypertension: Secondary | ICD-10-CM

## 2012-04-06 DIAGNOSIS — L84 Corns and callosities: Secondary | ICD-10-CM

## 2012-04-06 DIAGNOSIS — Z79899 Other long term (current) drug therapy: Secondary | ICD-10-CM

## 2012-04-06 LAB — BASIC METABOLIC PANEL WITH GFR
BUN: 13 mg/dL (ref 6–23)
Chloride: 104 mEq/L (ref 96–112)
Creat: 1.4 mg/dL — ABNORMAL HIGH (ref 0.50–1.35)
GFR, Est Non African American: 53 mL/min — ABNORMAL LOW
Glucose, Bld: 91 mg/dL (ref 70–99)
Potassium: 4.4 mEq/L (ref 3.5–5.3)

## 2012-04-06 LAB — POCT GLYCOSYLATED HEMOGLOBIN (HGB A1C): Hemoglobin A1C: 7.8

## 2012-04-06 MED ORDER — DOXYCYCLINE HYCLATE 100 MG PO CAPS: 100.0000 mg | ORAL_CAPSULE | Freq: Two times a day (BID) | ORAL | Status: DC

## 2012-04-06 NOTE — Assessment & Plan Note (Signed)
Assessment: Patient has a large chronic callus on his left sole at the base of his second toe, and now has developed a fissure or in the area of the callus with a small amount of serosanguineous drainage.  Exam shows no evidence of deep infection, but I think it is prudent to cover him with an oral antibiotic and refer him to the [facility] wound clinic where he has been followed in the past.  Plan: Treat with doxycycline 100 mg twice a day for 2 weeks; refer to [facility] wound clinic.

## 2012-04-06 NOTE — Assessment & Plan Note (Addendum)
  Component Value Date/Time   HGBA1C 7.8 04/06/2012 1024   HGBA1C 8.3 12/23/2011 1629   HGBA1C 8.5 09/16/2011 0855   HGBA1C 8.6 06/08/2011 0850     Assessment: Diabetes control: not controlled Progress toward goals: patient's diabetes control is improving, and he has had a few low blood sugars since his last visit whenever he skipped a meal.   Barriers to meeting goals: no barriers identified  Plan: Diabetes treatment: the plan is to continue his current medication regimen and continue home blood glucose monitoring. Refer to: the [facility] wound care clinic for management of his foot wound Instruction/counseling given: discussed foot care

## 2012-04-06 NOTE — Assessment & Plan Note (Addendum)
Lipids:    Component Value Date/Time   CHOL 158 10/08/2011 0857   TRIG 361* 10/08/2011 0857   HDL 24* 10/08/2011 0857   LDLCALC 62 10/08/2011 0857   VLDL 72* 10/08/2011 0857   CHOLHDL 6.6 10/08/2011 0857    Assessment: Patient's LDL is well controlled on his current dose of atorvastatin 20 mg daily.  Plan: Continue atorvastatin 20 mg daily.

## 2012-04-06 NOTE — Progress Notes (Signed)
  Subjective:    Patient ID: [name], male    DOB: [birth year], 61 y.o.   MRN: [id number]  HPI Patient returns for followup of his diabetes mellitus, hypertension, and other chronic medical problems.  He also reports that the area of callus on his left foot cracked a few days ago and has had a small amount of drainage; he has been taking some remaining oral antibiotic pills but did not bring the bottle and does not recall the name.  He reports that he has been compliant with his diabetes regimen.  He did not bring his medication list, but confirmed his doses from memory.  His blood sugars have been better since his last clinic visit, and he has had a few low values that occurred when he had skipped a meal.   Review of Systems  Constitutional: Negative for fever, chills and diaphoresis.  Cardiovascular: Positive for leg swelling (Chronic stable bilateral leg edema which resolves at night when legs are elevated). Negative for chest pain.  Gastrointestinal: Negative for nausea, vomiting and abdominal pain.       Objective:   Physical Exam  Constitutional: No distress.  Cardiovascular: Normal rate, regular rhythm and normal heart sounds.  Exam reveals no gallop and no friction rub.   No murmur heard.      1+ bilateral leg edema  Pulmonary/Chest: Effort normal and breath sounds normal. No respiratory distress. He has no wheezes. He has no rales.  Abdominal: Soft. Bowel sounds are normal. He exhibits no distension. There is no tenderness. There is no rebound and no guarding.  Skin:          Assessment & Plan:

## 2012-04-06 NOTE — Patient Instructions (Signed)
Take doxycycline 100 mg capsule twice a day for 2 weeks. Please keep appointment at the [facility] wound clinic for evaluation and treatment of left foot wound.

## 2012-04-06 NOTE — Assessment & Plan Note (Signed)
Lab Results  Component Value Date   NA 136 02/10/2012   K 4.5 02/10/2012   CL 103 02/10/2012   CO2 24 02/10/2012   BUN 17 02/10/2012   CREATININE 1.24 02/10/2012    BP Readings from Last 3 Encounters:  04/06/12 114/68  02/10/12 151/91  12/23/11 154/86    Assessment: Hypertension control:  controlled  Progress toward goals:  at goal Barriers to meeting goals:  no barriers identified  Plan: Hypertension treatment:  continue current medications (enalapril 7.5 mg daily and furosemide 40 mg each AM and 20 mg each PM.)

## 2012-04-19 ENCOUNTER — Ambulatory Visit (HOSPITAL_COMMUNITY)
Admission: RE | Admit: 2012-04-19 | Discharge: 2012-04-19 | Disposition: A | Discharge status: Home / Self Care | Payer: PRIVATE HEALTH INSURANCE | Source: Ambulatory Visit | Attending: General Surgery | Admitting: General Surgery

## 2012-04-19 ENCOUNTER — Other Ambulatory Visit (HOSPITAL_BASED_OUTPATIENT_CLINIC_OR_DEPARTMENT_OTHER): Payer: Self-pay | Admitting: General Surgery

## 2012-04-19 ENCOUNTER — Encounter (HOSPITAL_BASED_OUTPATIENT_CLINIC_OR_DEPARTMENT_OTHER): Payer: PRIVATE HEALTH INSURANCE | Attending: General Surgery

## 2012-04-19 DIAGNOSIS — X58XXXA Exposure to other specified factors, initial encounter: Secondary | ICD-10-CM | POA: Insufficient documentation

## 2012-04-19 DIAGNOSIS — M869 Osteomyelitis, unspecified: Secondary | ICD-10-CM

## 2012-04-19 DIAGNOSIS — L97509 Non-pressure chronic ulcer of other part of unspecified foot with unspecified severity: Secondary | ICD-10-CM | POA: Insufficient documentation

## 2012-04-19 DIAGNOSIS — Z79899 Other long term (current) drug therapy: Secondary | ICD-10-CM | POA: Insufficient documentation

## 2012-04-19 DIAGNOSIS — Z7982 Long term (current) use of aspirin: Secondary | ICD-10-CM | POA: Insufficient documentation

## 2012-04-19 DIAGNOSIS — S98139A Complete traumatic amputation of one unspecified lesser toe, initial encounter: Secondary | ICD-10-CM | POA: Insufficient documentation

## 2012-04-19 DIAGNOSIS — E1142 Type 2 diabetes mellitus with diabetic polyneuropathy: Secondary | ICD-10-CM | POA: Insufficient documentation

## 2012-04-19 DIAGNOSIS — S91309A Unspecified open wound, unspecified foot, initial encounter: Secondary | ICD-10-CM | POA: Insufficient documentation

## 2012-04-19 DIAGNOSIS — E1149 Type 2 diabetes mellitus with other diabetic neurological complication: Secondary | ICD-10-CM | POA: Insufficient documentation

## 2012-04-19 DIAGNOSIS — E1169 Type 2 diabetes mellitus with other specified complication: Secondary | ICD-10-CM | POA: Insufficient documentation

## 2012-04-19 NOTE — H&P (Signed)
[name], [name] NO.:  [id number]  MEDICAL RECORD NO.:  [id number]  LOCATION:  FOOT                         FACILITY:  [facility]  PHYSICIAN:  Joanne Gavel, M.D.        DATE OF BIRTH:  [birth year]  DATE OF ADMISSION:  04/19/2012 DATE OF DISCHARGE:                             HISTORY & PHYSICAL   CHIEF COMPLAINT:  Recurrent wound left foot.  HISTORY OF PRESENT ILLNESS:  This 61 year old male, diabetic, has profound neuropathy.  He was treated this year for an ulcer in the same location.  His treatment included hyperbaric oxygen and multiple castings.  He was healed out in June.  The wound is recurrent for 1 week.  He saw his medical doctor who placed him on doxycycline.  He has a diabetic foot wear which he has been wearing.  PAST MEDICAL HISTORY:  Significant for diabetes with neuropathy.  PAST SURGICAL HISTORY:  He had an amputation of the left third toe, and laser eye surgery.  SOCIAL HISTORY:  Cigarettes, none.  Alcohol occasionally.  ALLERGIES:  None.  MEDICATIONS:  Amitriptyline, Viagra, Lipitor, insulin, Prilosec, furosemide, gabapentin, aspirin, and hydrocodone p.r.n.  REVIEW OF SYSTEMS:  Essentially as above.  PHYSICAL EXAMINATION:  VITAL SIGNS:  Temperature 97.6, pulse 80 and regular, respirations 18, blood pressure 181/80.  Glucose is 147.  HEENT:  Eyes, ears, nose, throat essentially normal.  CHEST:  Clear.  HEART:  Regular rhythm.  Normal symmetrical strength.  EXTREMITIES:  Testing of sensation is revealing profound neuropathy of the leg and foot.  Distal pulses are palpable.  ABI is 1.2 on the left foot and the plantar surface.  There is a 2.0 x 1.5 x 0.4 ulceration surrounded by a very thick callus.  The callus was sharply excised. Tissue cultures taken from the midst of the wound.  Hemostasis of the wound was obtained with silver nitrate.  IMPRESSION:  Diabetic foot ulcer.  PLAN OF TREATMENT:  Rule out osteomyelitis with x-ray and  we will go quickly to easy casting for off-loading.  In the meantime, we will dress the wound with Fibracol and a donut and healing sandal.     Joanne Gavel, M.D.     RA/MEDQ  D:  04/19/2012  T:  04/19/2012  Job:  [id number]

## 2012-04-20 ENCOUNTER — Other Ambulatory Visit: Payer: Self-pay | Admitting: *Deleted

## 2012-04-20 MED ORDER — HYDROCODONE-ACETAMINOPHEN 5-325 MG PO TABS: ORAL_TABLET | ORAL | Status: DC

## 2012-04-20 NOTE — Telephone Encounter (Signed)
Called to pharm 

## 2012-04-20 NOTE — Telephone Encounter (Signed)
Refill approved - nurse to call in. 

## 2012-05-05 ENCOUNTER — Encounter: Payer: PRIVATE HEALTH INSURANCE | Attending: Plastic Surgery | Admitting: *Deleted

## 2012-05-11 ENCOUNTER — Ambulatory Visit (INDEPENDENT_AMBULATORY_CARE_PROVIDER_SITE_OTHER): Payer: PRIVATE HEALTH INSURANCE | Admitting: Ophthalmology

## 2012-05-11 DIAGNOSIS — E1139 Type 2 diabetes mellitus with other diabetic ophthalmic complication: Secondary | ICD-10-CM

## 2012-05-11 DIAGNOSIS — H43819 Vitreous degeneration, unspecified eye: Secondary | ICD-10-CM

## 2012-05-11 DIAGNOSIS — E11359 Type 2 diabetes mellitus with proliferative diabetic retinopathy without macular edema: Secondary | ICD-10-CM

## 2012-05-13 ENCOUNTER — Telehealth: Payer: Self-pay | Admitting: Dietician

## 2012-05-13 NOTE — Telephone Encounter (Signed)
Getting error code on meter because he is using out of date strips. Will request rx be sent to [facility] drug per pt request for testing supplies.

## 2012-05-14 ENCOUNTER — Other Ambulatory Visit: Payer: Self-pay | Admitting: Internal Medicine

## 2012-05-14 MED ORDER — GLUCOSE BLOOD VI STRP: ORAL_STRIP | Status: DC

## 2012-05-14 MED ORDER — ACCU-CHEK FASTCLIX LANCETS MISC: 1.0000 | Status: DC | PRN

## 2012-05-16 MED ORDER — GLUCOSE BLOOD VI STRP: ORAL_STRIP | Status: DC

## 2012-05-16 NOTE — Telephone Encounter (Signed)
Rx for test strips signed and submitted.

## 2012-05-17 ENCOUNTER — Encounter (HOSPITAL_BASED_OUTPATIENT_CLINIC_OR_DEPARTMENT_OTHER): Payer: PRIVATE HEALTH INSURANCE | Attending: General Surgery

## 2012-05-17 DIAGNOSIS — L97509 Non-pressure chronic ulcer of other part of unspecified foot with unspecified severity: Secondary | ICD-10-CM | POA: Insufficient documentation

## 2012-05-17 DIAGNOSIS — L84 Corns and callosities: Secondary | ICD-10-CM | POA: Insufficient documentation

## 2012-05-17 DIAGNOSIS — E1169 Type 2 diabetes mellitus with other specified complication: Secondary | ICD-10-CM | POA: Insufficient documentation

## 2012-05-18 ENCOUNTER — Encounter (INDEPENDENT_AMBULATORY_CARE_PROVIDER_SITE_OTHER): Payer: PRIVATE HEALTH INSURANCE | Admitting: Ophthalmology

## 2012-05-18 DIAGNOSIS — E11319 Type 2 diabetes mellitus with unspecified diabetic retinopathy without macular edema: Secondary | ICD-10-CM

## 2012-05-18 DIAGNOSIS — E1165 Type 2 diabetes mellitus with hyperglycemia: Secondary | ICD-10-CM

## 2012-05-18 DIAGNOSIS — H2510 Age-related nuclear cataract, unspecified eye: Secondary | ICD-10-CM

## 2012-05-18 DIAGNOSIS — E11311 Type 2 diabetes mellitus with unspecified diabetic retinopathy with macular edema: Secondary | ICD-10-CM

## 2012-05-18 DIAGNOSIS — H43819 Vitreous degeneration, unspecified eye: Secondary | ICD-10-CM

## 2012-05-18 DIAGNOSIS — E11359 Type 2 diabetes mellitus with proliferative diabetic retinopathy without macular edema: Secondary | ICD-10-CM

## 2012-05-24 ENCOUNTER — Encounter (HOSPITAL_BASED_OUTPATIENT_CLINIC_OR_DEPARTMENT_OTHER): Payer: PRIVATE HEALTH INSURANCE

## 2012-05-24 NOTE — Progress Notes (Signed)
[facility]  NAME:  GRAY, DOERING NO.:  [id number]  MEDICAL RECORD NO.:  [id number]      DATE OF BIRTH:  09/10/1950  PHYSICIAN:  Joanne Gavel, M.D.         VISIT DATE:  05/24/2012                                  OFFICE VISIT   [name] is a 61 year old diabetic male presented to our clinic with a 3.0 x 0.6 plantar ulceration of the left foot.  On April 19, 2012, he has been treated with multiple debridements, application of Fibracol followed by Endoform.  He has a special boot for offloading. On November 12, his ulcer after debridement measured at 0.9 x 0.5 x 0.3, and easily probes to bone.  In the past, he has responded to hyperbaric oxygen and I believe that the wound now was stalled out and hyperbaric oxygen is again indicated.     Joanne Gavel, M.D.     RA/MEDQ  D:  05/24/2012  T:  05/24/2012  Job:  [id number]

## 2012-05-26 ENCOUNTER — Other Ambulatory Visit: Payer: Self-pay | Admitting: Internal Medicine

## 2012-06-01 ENCOUNTER — Ambulatory Visit (INDEPENDENT_AMBULATORY_CARE_PROVIDER_SITE_OTHER): Payer: PRIVATE HEALTH INSURANCE | Admitting: Internal Medicine

## 2012-06-01 ENCOUNTER — Encounter: Payer: Self-pay | Admitting: Internal Medicine

## 2012-06-01 ENCOUNTER — Ambulatory Visit (HOSPITAL_COMMUNITY)
Admission: RE | Admit: 2012-06-01 | Discharge: 2012-06-01 | Disposition: A | Discharge status: Home / Self Care | Payer: PRIVATE HEALTH INSURANCE | Source: Ambulatory Visit | Attending: Internal Medicine | Admitting: Internal Medicine

## 2012-06-01 VITALS — BP 148/81 | HR 87 | Temp 97.9°F | Ht 72.6 in | Wt >= 6400 oz

## 2012-06-01 DIAGNOSIS — L97509 Non-pressure chronic ulcer of other part of unspecified foot with unspecified severity: Secondary | ICD-10-CM

## 2012-06-01 DIAGNOSIS — Z79899 Other long term (current) drug therapy: Secondary | ICD-10-CM

## 2012-06-01 DIAGNOSIS — E1169 Type 2 diabetes mellitus with other specified complication: Secondary | ICD-10-CM

## 2012-06-01 DIAGNOSIS — N189 Chronic kidney disease, unspecified: Secondary | ICD-10-CM

## 2012-06-01 DIAGNOSIS — D649 Anemia, unspecified: Secondary | ICD-10-CM

## 2012-06-01 DIAGNOSIS — L84 Corns and callosities: Secondary | ICD-10-CM

## 2012-06-01 LAB — CBC WITH DIFFERENTIAL/PLATELET
Basophils Absolute: 0 10*3/uL (ref 0.0–0.1)
Eosinophils Relative: 2 % (ref 0–5)
HCT: 36.4 % — ABNORMAL LOW (ref 39.0–52.0)
Hemoglobin: 12.1 g/dL — ABNORMAL LOW (ref 13.0–17.0)
Lymphocytes Relative: 29 % (ref 12–46)
MCHC: 33.2 g/dL (ref 30.0–36.0)
MCV: 85.4 fL (ref 78.0–100.0)
Monocytes Absolute: 0.6 10*3/uL (ref 0.1–1.0)
Monocytes Relative: 11 % (ref 3–12)
Neutro Abs: 3.2 10*3/uL (ref 1.7–7.7)
RDW: 15.3 % (ref 11.5–15.5)
WBC: 5.6 10*3/uL (ref 4.0–10.5)

## 2012-06-01 LAB — GLUCOSE, CAPILLARY: Glucose-Capillary: 108 mg/dL — ABNORMAL HIGH (ref 70–99)

## 2012-06-01 MED ORDER — INSULIN ASPART 100 UNIT/ML ~~LOC~~ SOLN: SUBCUTANEOUS | Status: DC

## 2012-06-01 NOTE — Assessment & Plan Note (Signed)
Creat  Date Value Range Status  04/06/2012 1.40* 0.50 - 1.35 mg/dL Final  1/61/0960 4.54  0.50 - 1.35 mg/dL Final  0/98/1191 4.78* 0.50 - 1.35 mg/dL Final     Assessment: Patient has chronic renal insufficiency related to diabetes and hypertension.  Plan: Will check a metabolic panel today.

## 2012-06-01 NOTE — Progress Notes (Signed)
  Subjective:    Patient ID: Arta Silence, male    DOB: 20-Apr-1951, 61 y.o.   MRN: [id number]  HPI Patient returns for followup of his diabetes mellitus, hypertension, and other chronic medical problems.  His main complaint today is pain in the area of his remaining lower front teeth that radiates into his gums.  His left foot wound/callus is being managed at the wound and hyperbaric center with dressings that are changed weekly, and patient reports that they plan to do hyperbaric oxygen therapy again; Dr Wiliam Ke requested a chest x-ray prior to starting the HBO.  Patient reports that he has been compliant with his medications.  His home glucose record shows an average blood sugar of 177 over the past month, with a range from 70 to 421; about two thirds of his measurements were above range, and one third were within range.   Review of Systems  Constitutional: Negative for fever and chills.  Respiratory: Negative for cough.   Cardiovascular: Negative for chest pain and leg swelling.  Gastrointestinal: Negative for nausea, vomiting, blood in stool and abdominal distention.  Genitourinary: Negative for dysuria, frequency and difficulty urinating.       Objective:   Physical Exam  Constitutional: No distress.  Cardiovascular: Normal rate, regular rhythm and normal heart sounds.  Exam reveals no gallop and no friction rub.   No murmur heard.      Trace leg edema  Pulmonary/Chest: Effort normal and breath sounds normal. He has no wheezes. He has no rales.  Abdominal: Soft. Bowel sounds are normal. There is no tenderness.       Assessment & Plan:

## 2012-06-01 NOTE — Assessment & Plan Note (Signed)
  Component Value Date/Time   HGBA1C 7.8 06/01/2012 1033   HGBA1C 7.8 04/06/2012 1024   HGBA1C 8.3 12/23/2011 1629     Assessment: Diabetes control: not controlled Progress toward goals: unchanged  Plan: Continue Lantus 155 units daily; increase NovoLog insulin to 60 units before breakfast; continue NovoLog 58 units before lunch and 56 units before dinner, plus correction insulin as before.

## 2012-06-01 NOTE — Assessment & Plan Note (Signed)
Hemoglobin  Date Value Range Status  02/10/2012 11.4* 13.0 - 17.0 g/dL Final  1/61/0960 45.4* 13.0 - 17.0 g/dL Final  0/98/1191 47.8* 13.0 - 17.0 g/dL Final    Assessment: Patient has mild chronic anemia, likely related to his chronic disease.  He is asymptomatic.  Plan: Will check a CBC today.

## 2012-06-01 NOTE — Assessment & Plan Note (Signed)
Assessment: Patient's left foot ulcer with associated callus is being managed at the wound and hyperbaric center.  They are doing weekly dressing changes.  Dr. Wiliam Ke plans to treat again with hyperbaric oxygen therapy, and requested a pretreatment chest x-ray.  Plan: A chest x-ray was ordered today; patient will follow up at the wound and hyperbaric center for treatment as scheduled.

## 2012-06-01 NOTE — Patient Instructions (Addendum)
Change NovoLog insulin dose to 60 units before breakfast, 58 units before lunch, and 56 units before dinner, plus 1 unit for every 10 mg/dl above 161 mg/dl. Continue other medications as before.

## 2012-06-02 LAB — COMPLETE METABOLIC PANEL WITH GFR
AST: 28 U/L (ref 0–37)
Albumin: 3.9 g/dL (ref 3.5–5.2)
Alkaline Phosphatase: 132 U/L — ABNORMAL HIGH (ref 39–117)
GFR, Est Non African American: 63 mL/min
Potassium: 4.5 mEq/L (ref 3.5–5.3)
Sodium: 139 mEq/L (ref 135–145)
Total Bilirubin: 0.5 mg/dL (ref 0.3–1.2)
Total Protein: 7.2 g/dL (ref 6.0–8.3)

## 2012-06-07 ENCOUNTER — Encounter (INDEPENDENT_AMBULATORY_CARE_PROVIDER_SITE_OTHER): Payer: PRIVATE HEALTH INSURANCE | Admitting: Ophthalmology

## 2012-06-07 DIAGNOSIS — H43819 Vitreous degeneration, unspecified eye: Secondary | ICD-10-CM

## 2012-06-07 DIAGNOSIS — E1139 Type 2 diabetes mellitus with other diabetic ophthalmic complication: Secondary | ICD-10-CM

## 2012-06-07 DIAGNOSIS — E11311 Type 2 diabetes mellitus with unspecified diabetic retinopathy with macular edema: Secondary | ICD-10-CM

## 2012-06-07 DIAGNOSIS — H2510 Age-related nuclear cataract, unspecified eye: Secondary | ICD-10-CM

## 2012-06-07 DIAGNOSIS — E11359 Type 2 diabetes mellitus with proliferative diabetic retinopathy without macular edema: Secondary | ICD-10-CM

## 2012-06-07 DIAGNOSIS — E11319 Type 2 diabetes mellitus with unspecified diabetic retinopathy without macular edema: Secondary | ICD-10-CM

## 2012-06-07 DIAGNOSIS — E1165 Type 2 diabetes mellitus with hyperglycemia: Secondary | ICD-10-CM

## 2012-06-07 LAB — GLUCOSE, CAPILLARY: Glucose-Capillary: 124 mg/dL — ABNORMAL HIGH (ref 70–99)

## 2012-06-08 ENCOUNTER — Other Ambulatory Visit: Payer: Self-pay | Admitting: *Deleted

## 2012-06-13 ENCOUNTER — Encounter (HOSPITAL_BASED_OUTPATIENT_CLINIC_OR_DEPARTMENT_OTHER): Payer: PRIVATE HEALTH INSURANCE | Attending: General Surgery

## 2012-06-13 DIAGNOSIS — L97509 Non-pressure chronic ulcer of other part of unspecified foot with unspecified severity: Secondary | ICD-10-CM | POA: Insufficient documentation

## 2012-06-13 DIAGNOSIS — L84 Corns and callosities: Secondary | ICD-10-CM | POA: Insufficient documentation

## 2012-06-13 DIAGNOSIS — E1169 Type 2 diabetes mellitus with other specified complication: Secondary | ICD-10-CM | POA: Insufficient documentation

## 2012-06-13 LAB — GLUCOSE, CAPILLARY
Glucose-Capillary: 64 mg/dL — ABNORMAL LOW (ref 70–99)
Glucose-Capillary: 71 mg/dL (ref 70–99)
Glucose-Capillary: 73 mg/dL (ref 70–99)
Glucose-Capillary: 81 mg/dL (ref 70–99)

## 2012-06-14 LAB — GLUCOSE, CAPILLARY: Glucose-Capillary: 59 mg/dL — ABNORMAL LOW (ref 70–99)

## 2012-06-15 ENCOUNTER — Encounter (INDEPENDENT_AMBULATORY_CARE_PROVIDER_SITE_OTHER): Payer: PRIVATE HEALTH INSURANCE | Admitting: Ophthalmology

## 2012-06-15 DIAGNOSIS — E1139 Type 2 diabetes mellitus with other diabetic ophthalmic complication: Secondary | ICD-10-CM

## 2012-06-15 DIAGNOSIS — H43819 Vitreous degeneration, unspecified eye: Secondary | ICD-10-CM

## 2012-06-15 DIAGNOSIS — E11359 Type 2 diabetes mellitus with proliferative diabetic retinopathy without macular edema: Secondary | ICD-10-CM

## 2012-06-15 DIAGNOSIS — E11319 Type 2 diabetes mellitus with unspecified diabetic retinopathy without macular edema: Secondary | ICD-10-CM

## 2012-06-15 DIAGNOSIS — H2510 Age-related nuclear cataract, unspecified eye: Secondary | ICD-10-CM

## 2012-06-15 DIAGNOSIS — E11311 Type 2 diabetes mellitus with unspecified diabetic retinopathy with macular edema: Secondary | ICD-10-CM

## 2012-06-15 LAB — GLUCOSE, CAPILLARY: Glucose-Capillary: 156 mg/dL — ABNORMAL HIGH (ref 70–99)

## 2012-06-16 LAB — GLUCOSE, CAPILLARY
Glucose-Capillary: 96 mg/dL (ref 70–99)
Glucose-Capillary: 97 mg/dL (ref 70–99)

## 2012-06-21 LAB — GLUCOSE, CAPILLARY
Glucose-Capillary: 120 mg/dL — ABNORMAL HIGH (ref 70–99)
Glucose-Capillary: 67 mg/dL — ABNORMAL LOW (ref 70–99)

## 2012-06-22 ENCOUNTER — Other Ambulatory Visit: Payer: Self-pay | Admitting: Internal Medicine

## 2012-06-22 LAB — GLUCOSE, CAPILLARY
Glucose-Capillary: 209 mg/dL — ABNORMAL HIGH (ref 70–99)
Glucose-Capillary: 151 mg/dL — ABNORMAL HIGH (ref 70–99)

## 2012-06-24 LAB — GLUCOSE, CAPILLARY: Glucose-Capillary: 187 mg/dL — ABNORMAL HIGH (ref 70–99)

## 2012-06-29 ENCOUNTER — Encounter (INDEPENDENT_AMBULATORY_CARE_PROVIDER_SITE_OTHER): Payer: PRIVATE HEALTH INSURANCE | Admitting: Ophthalmology

## 2012-06-29 DIAGNOSIS — H35039 Hypertensive retinopathy, unspecified eye: Secondary | ICD-10-CM

## 2012-06-29 DIAGNOSIS — E11359 Type 2 diabetes mellitus with proliferative diabetic retinopathy without macular edema: Secondary | ICD-10-CM

## 2012-06-29 DIAGNOSIS — I10 Essential (primary) hypertension: Secondary | ICD-10-CM

## 2012-06-29 DIAGNOSIS — H2510 Age-related nuclear cataract, unspecified eye: Secondary | ICD-10-CM

## 2012-06-29 DIAGNOSIS — E11311 Type 2 diabetes mellitus with unspecified diabetic retinopathy with macular edema: Secondary | ICD-10-CM

## 2012-06-29 DIAGNOSIS — E11319 Type 2 diabetes mellitus with unspecified diabetic retinopathy without macular edema: Secondary | ICD-10-CM

## 2012-06-29 DIAGNOSIS — H43819 Vitreous degeneration, unspecified eye: Secondary | ICD-10-CM

## 2012-06-29 DIAGNOSIS — E1139 Type 2 diabetes mellitus with other diabetic ophthalmic complication: Secondary | ICD-10-CM

## 2012-07-04 LAB — GLUCOSE, CAPILLARY: Glucose-Capillary: 51 mg/dL — ABNORMAL LOW (ref 70–99)

## 2012-07-07 LAB — GLUCOSE, CAPILLARY
Glucose-Capillary: 142 mg/dL — ABNORMAL HIGH (ref 70–99)
Glucose-Capillary: 105 mg/dL — ABNORMAL HIGH (ref 70–99)

## 2012-07-11 LAB — GLUCOSE, CAPILLARY
Glucose-Capillary: 166 mg/dL — ABNORMAL HIGH (ref 70–99)
Glucose-Capillary: 125 mg/dL — ABNORMAL HIGH (ref 70–99)

## 2012-07-12 LAB — GLUCOSE, CAPILLARY: Glucose-Capillary: 92 mg/dL (ref 70–99)

## 2012-07-14 ENCOUNTER — Encounter (HOSPITAL_BASED_OUTPATIENT_CLINIC_OR_DEPARTMENT_OTHER): Payer: PRIVATE HEALTH INSURANCE | Attending: General Surgery

## 2012-07-14 DIAGNOSIS — E1169 Type 2 diabetes mellitus with other specified complication: Secondary | ICD-10-CM | POA: Insufficient documentation

## 2012-07-14 DIAGNOSIS — L84 Corns and callosities: Secondary | ICD-10-CM | POA: Insufficient documentation

## 2012-07-14 DIAGNOSIS — L97509 Non-pressure chronic ulcer of other part of unspecified foot with unspecified severity: Secondary | ICD-10-CM | POA: Insufficient documentation

## 2012-07-14 LAB — GLUCOSE, CAPILLARY: Glucose-Capillary: 136 mg/dL — ABNORMAL HIGH (ref 70–99)

## 2012-07-18 ENCOUNTER — Encounter (HOSPITAL_BASED_OUTPATIENT_CLINIC_OR_DEPARTMENT_OTHER): Payer: PRIVATE HEALTH INSURANCE

## 2012-07-19 LAB — GLUCOSE, CAPILLARY
Glucose-Capillary: 299 mg/dL — ABNORMAL HIGH (ref 70–99)
Glucose-Capillary: 197 mg/dL — ABNORMAL HIGH (ref 70–99)

## 2012-07-20 ENCOUNTER — Other Ambulatory Visit: Payer: Self-pay | Admitting: *Deleted

## 2012-07-20 LAB — GLUCOSE, CAPILLARY
Glucose-Capillary: 167 mg/dL — ABNORMAL HIGH (ref 70–99)
Glucose-Capillary: 136 mg/dL — ABNORMAL HIGH (ref 70–99)

## 2012-07-20 MED ORDER — HYDROCODONE-ACETAMINOPHEN 5-325 MG PO TABS: ORAL_TABLET | ORAL | Status: DC

## 2012-07-20 NOTE — Telephone Encounter (Signed)
Refill approved - nurse to call in. 

## 2012-07-20 NOTE — Telephone Encounter (Signed)
Called to pharm 

## 2012-07-21 ENCOUNTER — Encounter (INDEPENDENT_AMBULATORY_CARE_PROVIDER_SITE_OTHER): Payer: PRIVATE HEALTH INSURANCE | Admitting: Ophthalmology

## 2012-07-21 DIAGNOSIS — E11359 Type 2 diabetes mellitus with proliferative diabetic retinopathy without macular edema: Secondary | ICD-10-CM

## 2012-07-21 DIAGNOSIS — E11319 Type 2 diabetes mellitus with unspecified diabetic retinopathy without macular edema: Secondary | ICD-10-CM

## 2012-07-21 DIAGNOSIS — E1139 Type 2 diabetes mellitus with other diabetic ophthalmic complication: Secondary | ICD-10-CM

## 2012-07-21 DIAGNOSIS — H43819 Vitreous degeneration, unspecified eye: Secondary | ICD-10-CM

## 2012-07-21 DIAGNOSIS — H35039 Hypertensive retinopathy, unspecified eye: Secondary | ICD-10-CM

## 2012-07-21 DIAGNOSIS — I10 Essential (primary) hypertension: Secondary | ICD-10-CM

## 2012-07-21 DIAGNOSIS — E11311 Type 2 diabetes mellitus with unspecified diabetic retinopathy with macular edema: Secondary | ICD-10-CM

## 2012-07-22 ENCOUNTER — Other Ambulatory Visit: Payer: Self-pay | Admitting: *Deleted

## 2012-07-22 LAB — GLUCOSE, CAPILLARY
Glucose-Capillary: 236 mg/dL — ABNORMAL HIGH (ref 70–99)
Glucose-Capillary: 170 mg/dL — ABNORMAL HIGH (ref 70–99)

## 2012-07-22 MED ORDER — INSULIN GLARGINE 100 UNIT/ML ~~LOC~~ SOLN: 155.0000 [IU] | Freq: Every day | SUBCUTANEOUS | Status: DC

## 2012-07-25 LAB — GLUCOSE, CAPILLARY
Glucose-Capillary: 161 mg/dL — ABNORMAL HIGH (ref 70–99)
Glucose-Capillary: 138 mg/dL — ABNORMAL HIGH (ref 70–99)

## 2012-07-27 ENCOUNTER — Encounter: Payer: Self-pay | Admitting: Internal Medicine

## 2012-07-27 ENCOUNTER — Ambulatory Visit (INDEPENDENT_AMBULATORY_CARE_PROVIDER_SITE_OTHER): Payer: PRIVATE HEALTH INSURANCE | Admitting: Internal Medicine

## 2012-07-27 VITALS — BP 140/80 | HR 77 | Temp 97.0°F | Ht 72.0 in | Wt >= 6400 oz

## 2012-07-27 DIAGNOSIS — F0789 Other personality and behavioral disorders due to known physiological condition: Secondary | ICD-10-CM

## 2012-07-27 DIAGNOSIS — L97529 Non-pressure chronic ulcer of other part of left foot with unspecified severity: Secondary | ICD-10-CM

## 2012-07-27 DIAGNOSIS — L97509 Non-pressure chronic ulcer of other part of unspecified foot with unspecified severity: Secondary | ICD-10-CM

## 2012-07-27 DIAGNOSIS — R413 Other amnesia: Secondary | ICD-10-CM

## 2012-07-27 DIAGNOSIS — I10 Essential (primary) hypertension: Secondary | ICD-10-CM

## 2012-07-27 LAB — BASIC METABOLIC PANEL WITH GFR
CO2: 24 mEq/L (ref 19–32)
Calcium: 8.9 mg/dL (ref 8.4–10.5)
Chloride: 104 mEq/L (ref 96–112)
Creat: 1.45 mg/dL — ABNORMAL HIGH (ref 0.50–1.35)
Glucose, Bld: 169 mg/dL — ABNORMAL HIGH (ref 70–99)
Sodium: 138 mEq/L (ref 135–145)

## 2012-07-27 LAB — VITAMIN B12: Vitamin B-12: 439 pg/mL (ref 211–911)

## 2012-07-27 NOTE — Assessment & Plan Note (Signed)
Lab Results  Component Value Date   HGBA1C 7.8 06/01/2012   HGBA1C 7.8 04/06/2012   HGBA1C 8.3 12/23/2011     Assessment:  Diabetes control: fair control  Progress toward A1C goal:  unchanged  Comments: Patient's diabetes management has been complicated by his current hyperbaric oxygen treatment of his left foot wound.  He reports that his treatment were canceled on more than one occasion because his blood sugars were below 150, and therefore he has been holding his morning insulin prior to treatment.  I advised him that whenever the hyperbaric treatments end, he should resume his usual regimen.  Overall he denies severe hyperglycemia.  Plan:  Medications:  continue current medications  Home glucose monitoring:   Frequency: 3 times a day   Timing: before meals  Instruction/counseling given: discussed sick day management  Educational resources provided: brochure  Self management tools provided: home glucose logbook

## 2012-07-27 NOTE — Progress Notes (Signed)
  Subjective:    Patient ID: [name], male    DOB: [birth year], 62 y.o.   MRN: [id number]  HPI Patient returns for followup of his diabetes mellitus, hypertension, and other chronic medical problems.  He has no acute complaints today.  He has been receiving hyperbaric oxygen treatments of his left foot wound for the past couple of weeks, and reports that his wound is improving; this is being managed by the wound clinic.  He did say that he has been skipping has breakfast insulin and occasionally his lunchtime insulin because they will not administer the hyperbaric treatment if his blood sugar is below 150, which was happening when he did take his morning insulin.  His blood glucose meter shows an average value over the past month of 170, with a seven day average of 175.  Patient brought a note from a home nursing visit asking if he should be evaluated for dementia; I asked patient about this, and he said that he has had some problem remembering things but these seem to be mild and occasional only.   Review of Systems  Respiratory: Negative for shortness of breath.   Cardiovascular: Negative for chest pain.  Gastrointestinal: Negative for nausea, vomiting and abdominal pain.       Objective:   Physical Exam  Constitutional: No distress.  Cardiovascular: Normal rate, regular rhythm and normal heart sounds.  Exam reveals no gallop and no friction rub.   No murmur heard.      Trace leg edema  Pulmonary/Chest: Breath sounds normal. No respiratory distress. He has no wheezes. He has no rales.  Neurologic: Patient scored 28/30 on Mini-Mental state exam; his only problem was with spelling "world" backward.     Assessment & Plan:

## 2012-07-27 NOTE — Patient Instructions (Signed)
General Instructions: Continue current medications.   Treatment Goals:  Goals (1 Years of Data) as of 07/27/2012          As of Today As of Today 06/01/12 04/06/12 02/10/12     Blood Pressure    . Blood Pressure < 140/80  140/80 154/85 148/81 114/68 151/91     Result Component    . HEMOGLOBIN A1C < 7.0    7.8 7.8     . LDL CALC < 100            Progress Toward Treatment Goals:  Treatment Goal 07/27/2012  Hemoglobin A1C unchanged  Blood pressure at goal    Self Care Goals & Plans:  Self Care Goal 07/27/2012  Manage my medications take my medicines as prescribed; refill my medications on time  Monitor my health keep track of my weight; keep track of my blood pressure  Eat healthy foods eat more vegetables; eat foods that are low in salt  Be physically active take a walk every day; take the stairs instead of the elevator    Home Blood Glucose Monitoring 07/27/2012  Check my blood sugar 3 times a day  When to check my blood sugar before meals     Care Management & Community Referrals:  Referral 07/27/2012  Referrals made for care management support none needed

## 2012-07-27 NOTE — Assessment & Plan Note (Signed)
Assessment: Patient reports occasional mild memory problems but these do not seem to be interfering with his quality of life or ability to manage his affairs.  His mental status exam today shows no significant problems.  Plan: Will check a TSH and vitamin B12 level today.  I advised patient to let me know if he has any worsening of his symptoms.

## 2012-07-27 NOTE — Assessment & Plan Note (Signed)
BP Readings from Last 3 Encounters:  07/27/12 140/80  06/01/12 148/81  04/06/12 114/68    Lab Results  Component Value Date   NA 139 06/01/2012   K 4.5 06/01/2012   CREATININE 1.22 06/01/2012    Assessment:  Blood pressure control: controlled  Progress toward BP goal:  at goal  Comments: patient is doing well on current regimen of enalapril 7.5 mg daily and furosemide 40 mg each morning and 20 mg each evening.  Plan:  Medications:  continue current medications  Educational resources provided: handout  Self management tools provided: home blood pressure logbook  Other plans: check a basic metabolic panel today.

## 2012-07-27 NOTE — Assessment & Plan Note (Signed)
Assessment: Patient's left foot ulcer with associated callus is being managed at the wound and hyperbaric center, currently with hyperbaric oxygen therapy.  Plan: Patient will follow up at the wound and hyperbaric center for treatment as scheduled.

## 2012-07-28 ENCOUNTER — Encounter (INDEPENDENT_AMBULATORY_CARE_PROVIDER_SITE_OTHER): Payer: PRIVATE HEALTH INSURANCE | Admitting: Ophthalmology

## 2012-07-28 LAB — GLUCOSE, CAPILLARY: Glucose-Capillary: 135 mg/dL — ABNORMAL HIGH (ref 70–99)

## 2012-08-01 LAB — GLUCOSE, CAPILLARY: Glucose-Capillary: 203 mg/dL — ABNORMAL HIGH (ref 70–99)

## 2012-08-02 LAB — GLUCOSE, CAPILLARY
Glucose-Capillary: 93 mg/dL (ref 70–99)
Glucose-Capillary: 130 mg/dL — ABNORMAL HIGH (ref 70–99)
Glucose-Capillary: 121 mg/dL — ABNORMAL HIGH (ref 70–99)

## 2012-08-03 LAB — GLUCOSE, CAPILLARY: Glucose-Capillary: 139 mg/dL — ABNORMAL HIGH (ref 70–99)

## 2012-08-04 ENCOUNTER — Telehealth: Payer: Self-pay | Admitting: *Deleted

## 2012-08-04 DIAGNOSIS — I10 Essential (primary) hypertension: Secondary | ICD-10-CM

## 2012-08-04 LAB — GLUCOSE, CAPILLARY
Glucose-Capillary: 278 mg/dL — ABNORMAL HIGH (ref 70–99)
Glucose-Capillary: 227 mg/dL — ABNORMAL HIGH (ref 70–99)

## 2012-08-04 MED ORDER — ENALAPRIL MALEATE 2.5 MG PO TABS: 12.5000 mg | ORAL_TABLET | Freq: Every day | ORAL | Status: DC

## 2012-08-04 NOTE — Telephone Encounter (Signed)
I called the Wound Center and spoke with the technician there and with Dr. Baltazar Najjar.  Patient's blood pressure has been markedly elevated after hyperbaric treatment as noted below.  Patient has been asymptomatic.  His next treatment is scheduled for tomorrow afternoon.  I also called patient and spoke with him; he reports that he has been taking his enalapril 7.5 mg each morning and also his furosemide as prescribed.  He has had no symptoms during or after the hyperbaric treatments, and denies headache, chest pain, shortness of breath, or other complaints.  Patient also reports that his last hyperbaric treatment is scheduled for next Wednesday.  Dr. Leanord Hawking was comfortable with a plan to increase patient's antihypertensive regimen and continue hyperbaric treatments.  The plan is to increase his enalapril from 7.5 mg each morning to 12.5 mg each morning; I also advised him to take a single additional dose of 2.5 mg now.  He will follow up in the clinic next Thursday morning 08/11/2012 with Dr. Burtis Junes.  Once his enalapril dose is established, the plan will be to switch to a more convenient tablet size than his current 2.5 mg tablets.  We will need to watch his renal function as the ACE inhibitor is adjusted.

## 2012-08-04 NOTE — Telephone Encounter (Signed)
Pt called and asked for Dr Meredith Pel to call [facility] at the wound center about pt's blood pressure.  I called pt back and states BP is elevated each time he gets out of the hyperbaric chamber. [facility] # [phone]  #[phone] I talked with [facility] tech and he reports pt BP before treatment runs from 150/80  -  180/ 92. Then, after treatment pressure is up to  200/110.   Then before discharge after voiding diasostic remains over 100.  He wanted Dr Meredith Pel to be aware of this.  Last BP in clinic was 140/80

## 2012-08-04 NOTE — Assessment & Plan Note (Signed)
I called the Wound Center and spoke with the technician there and with Dr. Michael Robson.  Patient's blood pressure has been markedly elevated after hyperbaric treatment as noted below.  Patient has been asymptomatic.  His next treatment is scheduled for tomorrow afternoon.  I also called patient and spoke with him; he reports that he has been taking his enalapril 7.5 mg each morning and also his furosemide as prescribed.  He has had no symptoms during or after the hyperbaric treatments, and denies headache, chest pain, shortness of breath, or other complaints.  Patient also reports that his last hyperbaric treatment is scheduled for next Wednesday.  Dr. Robson was comfortable with a plan to increase patient's antihypertensive regimen and continue hyperbaric treatments.  The plan is to increase his enalapril from 7.5 mg each morning to 12.5 mg each morning; I also advised him to take a single additional dose of 2.5 mg now.  He will follow up in the clinic next Thursday morning 08/11/2012 with Dr. Sadek.  Once his enalapril dose is established, the plan will be to switch to a more convenient tablet size than his current 2.5 mg tablets.  We will need to watch his renal function as the ACE inhibitor is adjusted. 

## 2012-08-05 ENCOUNTER — Encounter: Payer: PRIVATE HEALTH INSURANCE | Admitting: Internal Medicine

## 2012-08-05 LAB — GLUCOSE, CAPILLARY: Glucose-Capillary: 229 mg/dL — ABNORMAL HIGH (ref 70–99)

## 2012-08-08 ENCOUNTER — Encounter (INDEPENDENT_AMBULATORY_CARE_PROVIDER_SITE_OTHER): Payer: PRIVATE HEALTH INSURANCE | Admitting: Ophthalmology

## 2012-08-08 DIAGNOSIS — E1139 Type 2 diabetes mellitus with other diabetic ophthalmic complication: Secondary | ICD-10-CM

## 2012-08-08 DIAGNOSIS — E11359 Type 2 diabetes mellitus with proliferative diabetic retinopathy without macular edema: Secondary | ICD-10-CM

## 2012-08-08 DIAGNOSIS — I10 Essential (primary) hypertension: Secondary | ICD-10-CM

## 2012-08-08 DIAGNOSIS — H35039 Hypertensive retinopathy, unspecified eye: Secondary | ICD-10-CM

## 2012-08-08 DIAGNOSIS — H43819 Vitreous degeneration, unspecified eye: Secondary | ICD-10-CM

## 2012-08-08 DIAGNOSIS — E11319 Type 2 diabetes mellitus with unspecified diabetic retinopathy without macular edema: Secondary | ICD-10-CM

## 2012-08-08 DIAGNOSIS — E11311 Type 2 diabetes mellitus with unspecified diabetic retinopathy with macular edema: Secondary | ICD-10-CM

## 2012-08-08 DIAGNOSIS — H2510 Age-related nuclear cataract, unspecified eye: Secondary | ICD-10-CM

## 2012-08-08 LAB — GLUCOSE, CAPILLARY: Glucose-Capillary: 158 mg/dL — ABNORMAL HIGH (ref 70–99)

## 2012-08-11 ENCOUNTER — Ambulatory Visit (INDEPENDENT_AMBULATORY_CARE_PROVIDER_SITE_OTHER): Payer: PRIVATE HEALTH INSURANCE | Admitting: Internal Medicine

## 2012-08-11 ENCOUNTER — Encounter: Payer: Self-pay | Admitting: Internal Medicine

## 2012-08-11 ENCOUNTER — Encounter: Payer: PRIVATE HEALTH INSURANCE | Admitting: Internal Medicine

## 2012-08-11 VITALS — BP 151/93 | HR 84 | Temp 98.7°F | Ht 78.0 in | Wt >= 6400 oz

## 2012-08-11 DIAGNOSIS — I10 Essential (primary) hypertension: Secondary | ICD-10-CM

## 2012-08-11 LAB — BASIC METABOLIC PANEL WITH GFR
CO2: 25 mEq/L (ref 19–32)
Calcium: 8.8 mg/dL (ref 8.4–10.5)
Creat: 1.42 mg/dL — ABNORMAL HIGH (ref 0.50–1.35)
Glucose, Bld: 220 mg/dL — ABNORMAL HIGH (ref 70–99)
Sodium: 136 mEq/L (ref 135–145)

## 2012-08-11 MED ORDER — ENALAPRIL MALEATE 10 MG PO TABS: ORAL_TABLET | ORAL | Status: DC

## 2012-08-11 NOTE — Patient Instructions (Addendum)
Please return to clinic as scheduled in February.

## 2012-08-11 NOTE — Assessment & Plan Note (Signed)
Patient presents for followup for elevated blood pressure after hyperbaric chamber. He has had blood pressures after hyperbaric treatments as high as 200/110. Patient is asymptomatic during these episodes. This may be episodic elevations in blood pressure related to hyperbaric treatments or perhaps a stress reaction to being in the chamber. His blood pressure in the office today is 166/90 and on repeat, it is 152/93. I am hesitant to add another agent as patient reports symptoms of dizziness and lightheaded upon standing. I am also not inclined to increase the dose of enalapril without evaluating his renal function first. Discussed with Dr. Criselda Peaches, attending physician, and we considered adding 5 mg of amlodipine to this patient's regimen. Will discuss with Dr. Meredith Pel and defer the decision to him as he knows this patient better.  -Will check BMP today -Check orthostatics -Will defer further changes in therapy until we determine whether patient will require further hyperbaric treatments

## 2012-08-11 NOTE — Progress Notes (Signed)
Internal Medicine Clinic Visit    HPI:  [name] is a 62 y.o. year old male with a history DM, chronic foot wound, hypertension, who presents with elevated blood pressure.  Patient received hyperbaric chamber treatments for his chronic lower extremity wound and states that he always has high blood pressure after the treatment. Before the treatment, his diastolic blood pressure in the 70s and 80s and posttreatment his diastolic has been consistently over 100. It is been as high as 200/110. Patient is asymptomatic during these episodes. He does not check his blood pressure at home, but he states his blood pressure is not usually this high.  He did discuss with his doctor, and he has been taking a higher dose of the enalapril 12.5 mg daily for the past week. His blood pressure has remained high and his higher dose.  Patient states his foot wound is doing better. The wound is being reevaluated next week to assess the need for further hyperbaric treatments.  Patient denies fever, chills, worsening lower extremity edema, headache, chest pain, shortness of breath.   Past Medical History  Diagnosis Date  . Diabetes mellitus     w/complication NOS, type II  . Edema, macular, due to secondary diabetes   . Retinopathy   . Neuropathy, lower extremity   . Hyperlipidemia   . Chronic renal insufficiency   . GERD (gastroesophageal reflux disease)   . Erectile dysfunction   . Osteomyelitis of ankle and foot   . Hemorrhoids   . DIABETIC FOOT ULCER 06/20/2009  . Obstructive sleep apnea     Nocturnal polysomnogram on 01/21/2010 showed severe obstructive sleep apnea/hypopnea syndrome, AHI 74.1 per hour with non positional events, moderately loud snoring, and oxygen desaturation to a nadir of 78% on room air.  CPAP was successfully titrated to 17 CWP, AHI 1.1 per hour using a large ResMed Mirage Quattro full-face mask with heated humidifier. Bruxism was noted.   Marland Kitchen HEARING LOSS, SENSORINEURAL,  BILATERAL 01/03/2007    Seen by ENT Dr. Karren Burly D. Jenne Pane 01/03/07    Past Surgical History  Procedure Date  . 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 [name]. [name].     ROS:  A complete review of systems was otherwise negative, except as noted in the HPI.  Allergies: Vancomycin  Medications: Current Outpatient Prescriptions  Medication Sig Dispense Refill  . ACCU-CHEK FASTCLIX LANCETS MISC Place 1 each onto the skin as needed. Use as directed to check blood sugar. Use as directed to check blood sugar.  102 each  6  . albuterol (PROVENTIL HFA;VENTOLIN HFA) 108 (90 BASE) MCG/ACT inhaler Inhale 2 puffs into the lungs every 6 (six) hours as needed. For wheezing or shortness of breath  2 Inhaler  11  . amitriptyline (ELAVIL) 75 MG tablet TAKE ONE (1) TABLET(S) ONCE DAILY  30 tablet  3  . aspirin 81 MG EC tablet Take 1 tablet (81 mg total) by mouth daily.  90 tablet  3  . atorvastatin (LIPITOR) 20 MG tablet TAKE ONE TABLET BY MOUTH ONE TIME DAILY  31 tablet  6  . Blood Glucose Monitoring Suppl (ACCU-CHEK AVIVA PLUS) W/DEVICE KIT 1 each by Does not apply route 4 (four) times daily -  with meals and at bedtime.  1 kit  0  . enalapril (VASOTEC) 2.5 MG tablet Take 5 tablets (12.5 mg total) by mouth daily.  150 tablet  0  .  furosemide (LASIX) 20 MG tablet Take 2 tablets each morning and 1 tablet each evening.  90 tablet  3  . glucose blood test strip check blood sugar 4 times a day before meals & bedtime Dx code- 250.00 insulin requiring  150 each  12  . HYDROcodone-acetaminophen (NORCO/VICODIN) 5-325 MG per tablet Take one and one-half (1 & 1/2) tablets by mouth every six (6) hours as needed for pain.  180 tablet  2  . insulin aspart (NOVOLOG FLEXPEN) 100 UNIT/ML injection Inject 60 units before breakfast, 58 units before lunch, and 56 units before dinner, plus 1 unit for every 10 mg/dl above 161 mg/dl  60 mL  6  .  insulin glargine (LANTUS SOLOSTAR) 100 UNIT/ML injection Inject 155 Units into the skin daily.  60 mL  6  . Insulin Pen Needle (PRODIGY SHORT PEN NEEDLES) 31G X 8 MM MISC For insulin injection two times a day  100 each  6  . Multiple Vitamin (MULTIVITAMIN) tablet Take 1 tablet by mouth daily.      Marland Kitchen omeprazole (PRILOSEC) 20 MG capsule Take 1 capsule (20 mg total) by mouth daily.  90 capsule  3  . promethazine (PHENERGAN) 25 MG tablet Take 0.5-1 tablets (12.5-25 mg total) by mouth every 6 (six) hours as needed for nausea.  60 tablet  3  . sildenafil (VIAGRA) 50 MG tablet Take 1 tablet (50 mg total) by mouth daily as needed. Take one tablet 1 hour before sexual activity. Do not take more than 1 per day.  10 tablet  5    History   Social History  . Marital Status: Married    Spouse Name: N/A    Number of Children: N/A  . Years of Education: N/A   Occupational History  . Not on file.   Social History Main Topics  . Smoking status: Former Smoker -- 0.5 packs/day for 30 years    Types: Cigarettes    Quit date: 02/10/2010  . Smokeless tobacco: Never Used  . Alcohol Use: 2.0 oz/week    4 drink(s) per week     Comment: About 4 drinks per week.  . Drug Use: No  . Sexually Active: Not on file   Other Topics Concern  . Not on file   Social History Narrative   Financial assistance application completed. Pt does not qualify for assistance-over income [name] 10-17-09    family history includes Diabetes in his mother; Heart attack (age of onset:46) in his father; and Throat cancer in his brother.  Physical Exam Blood pressure 152/93, pulse 80, temperature 98.7 F (37.1 C), temperature source Oral, height 6\' 6"  (1.981 m), weight 400 lb 11.2 oz (181.756 kg), SpO2 97.00%. General:  No acute distress, alert and oriented x 3, well-appearing  HEENT:  PERRL, EOMI, no lymphadenopathy, moist mucous membranes Cardiovascular:  Regular rate and rhythm, no murmurs, rubs or gallops Respiratory:   Clear to auscultation bilaterally, no wheezes, rales, or rhonchi Abdomen:  Soft, nondistended, nontender, normoactive bowel sounds Extremities:  Warm and well-perfused. LLE with boot in place Skin: Warm, dry, no rashes Neuro: Not anxious appearing, no depressed mood, normal affect  Labs: Lab Results  Component Value Date   CREATININE 1.45* 07/27/2012   BUN 16 07/27/2012   NA 138 07/27/2012   K 4.3 07/27/2012   CL 104 07/27/2012   CO2 24 07/27/2012   Lab Results  Component Value Date   WBC 5.6 06/01/2012   HGB 12.1* 06/01/2012   HCT 36.4*  06/01/2012   MCV 85.4 06/01/2012   PLT 164 06/01/2012      Assessment and Plan:    FOLLOWUP: Arta Silence will follow back up in our clinic in approximately 1 month as scheduled. Arta Silence knows to call our clinic in the meantime with any questions or new issues.

## 2012-08-12 LAB — GLUCOSE, CAPILLARY: Glucose-Capillary: 176 mg/dL — ABNORMAL HIGH (ref 70–99)

## 2012-08-15 ENCOUNTER — Other Ambulatory Visit (INDEPENDENT_AMBULATORY_CARE_PROVIDER_SITE_OTHER): Payer: PRIVATE HEALTH INSURANCE | Admitting: Ophthalmology

## 2012-08-16 ENCOUNTER — Encounter (HOSPITAL_BASED_OUTPATIENT_CLINIC_OR_DEPARTMENT_OTHER): Payer: PRIVATE HEALTH INSURANCE | Attending: General Surgery

## 2012-08-16 DIAGNOSIS — L97509 Non-pressure chronic ulcer of other part of unspecified foot with unspecified severity: Secondary | ICD-10-CM | POA: Insufficient documentation

## 2012-08-16 DIAGNOSIS — E1169 Type 2 diabetes mellitus with other specified complication: Secondary | ICD-10-CM | POA: Insufficient documentation

## 2012-08-18 ENCOUNTER — Encounter (INDEPENDENT_AMBULATORY_CARE_PROVIDER_SITE_OTHER): Payer: PRIVATE HEALTH INSURANCE | Admitting: Ophthalmology

## 2012-08-18 DIAGNOSIS — E11319 Type 2 diabetes mellitus with unspecified diabetic retinopathy without macular edema: Secondary | ICD-10-CM

## 2012-08-18 DIAGNOSIS — H35039 Hypertensive retinopathy, unspecified eye: Secondary | ICD-10-CM

## 2012-08-18 DIAGNOSIS — I10 Essential (primary) hypertension: Secondary | ICD-10-CM

## 2012-08-18 DIAGNOSIS — E11359 Type 2 diabetes mellitus with proliferative diabetic retinopathy without macular edema: Secondary | ICD-10-CM

## 2012-08-18 DIAGNOSIS — E11311 Type 2 diabetes mellitus with unspecified diabetic retinopathy with macular edema: Secondary | ICD-10-CM

## 2012-08-18 DIAGNOSIS — E1139 Type 2 diabetes mellitus with other diabetic ophthalmic complication: Secondary | ICD-10-CM

## 2012-08-18 DIAGNOSIS — H2510 Age-related nuclear cataract, unspecified eye: Secondary | ICD-10-CM

## 2012-08-18 DIAGNOSIS — H43819 Vitreous degeneration, unspecified eye: Secondary | ICD-10-CM

## 2012-08-22 ENCOUNTER — Other Ambulatory Visit: Payer: Self-pay | Admitting: Internal Medicine

## 2012-08-23 NOTE — Progress Notes (Signed)
[facility]  NAME:  [name], [name]                 ACCOUNT NO.:  MEDICAL RECORD NO.:  [id number]      DATE OF BIRTH:  [birth year]  PHYSICIAN:  Joanne Gavel, M.D.              VISIT DATE:                                  OFFICE VISIT   COURSE OF TREATMENT:  This patient had a multiple recurrent diabetic foot ulcer on the plantar surface of his left foot.  He was treated with debridements, various methods of off-loading.  He had a course of hyperbaric oxygen therapy and finally was healed on the date of discharge.  DISCHARGE CONDITION:  Good.  RECOMMENDATIONS:  Diabetic shoes and continue offloading with foam and felt.  We will see him p.r.n.     Joanne Gavel, M.D.     RA/MEDQ  D:  08/23/2012  T:  08/23/2012  Job:  [id number]

## 2012-09-05 ENCOUNTER — Encounter (INDEPENDENT_AMBULATORY_CARE_PROVIDER_SITE_OTHER): Payer: PRIVATE HEALTH INSURANCE | Admitting: Ophthalmology

## 2012-09-05 DIAGNOSIS — E11319 Type 2 diabetes mellitus with unspecified diabetic retinopathy without macular edema: Secondary | ICD-10-CM

## 2012-09-05 DIAGNOSIS — H35039 Hypertensive retinopathy, unspecified eye: Secondary | ICD-10-CM

## 2012-09-05 DIAGNOSIS — E11311 Type 2 diabetes mellitus with unspecified diabetic retinopathy with macular edema: Secondary | ICD-10-CM

## 2012-09-05 DIAGNOSIS — E1165 Type 2 diabetes mellitus with hyperglycemia: Secondary | ICD-10-CM

## 2012-09-05 DIAGNOSIS — E11359 Type 2 diabetes mellitus with proliferative diabetic retinopathy without macular edema: Secondary | ICD-10-CM

## 2012-09-05 DIAGNOSIS — I10 Essential (primary) hypertension: Secondary | ICD-10-CM

## 2012-09-07 ENCOUNTER — Ambulatory Visit (INDEPENDENT_AMBULATORY_CARE_PROVIDER_SITE_OTHER): Payer: PRIVATE HEALTH INSURANCE | Admitting: Internal Medicine

## 2012-09-07 ENCOUNTER — Encounter: Payer: Self-pay | Admitting: Internal Medicine

## 2012-09-07 VITALS — BP 160/90 | HR 78 | Temp 97.2°F | Ht 78.0 in | Wt 398.4 lb

## 2012-09-07 DIAGNOSIS — I129 Hypertensive chronic kidney disease with stage 1 through stage 4 chronic kidney disease, or unspecified chronic kidney disease: Secondary | ICD-10-CM

## 2012-09-07 DIAGNOSIS — Z79899 Other long term (current) drug therapy: Secondary | ICD-10-CM

## 2012-09-07 DIAGNOSIS — N189 Chronic kidney disease, unspecified: Secondary | ICD-10-CM

## 2012-09-07 DIAGNOSIS — Z8631 Personal history of diabetic foot ulcer: Secondary | ICD-10-CM

## 2012-09-07 DIAGNOSIS — E1142 Type 2 diabetes mellitus with diabetic polyneuropathy: Secondary | ICD-10-CM

## 2012-09-07 DIAGNOSIS — E782 Mixed hyperlipidemia: Secondary | ICD-10-CM

## 2012-09-07 DIAGNOSIS — E1149 Type 2 diabetes mellitus with other diabetic neurological complication: Secondary | ICD-10-CM

## 2012-09-07 DIAGNOSIS — I10 Essential (primary) hypertension: Secondary | ICD-10-CM

## 2012-09-07 LAB — COMPLETE METABOLIC PANEL WITH GFR
ALT: 18 U/L (ref 0–53)
Albumin: 4.1 g/dL (ref 3.5–5.2)
CO2: 27 mEq/L (ref 19–32)
Chloride: 103 mEq/L (ref 96–112)
GFR, Est African American: 60 mL/min
Glucose, Bld: 166 mg/dL — ABNORMAL HIGH (ref 70–99)
Potassium: 4.5 mEq/L (ref 3.5–5.3)
Sodium: 137 mEq/L (ref 135–145)
Total Bilirubin: 0.5 mg/dL (ref 0.3–1.2)
Total Protein: 7.1 g/dL (ref 6.0–8.3)

## 2012-09-07 LAB — GLUCOSE, CAPILLARY: Glucose-Capillary: 213 mg/dL — ABNORMAL HIGH (ref 70–99)

## 2012-09-07 LAB — LIPID PANEL
HDL: 32 mg/dL — ABNORMAL LOW (ref >39)
LDL Cholesterol: 66 mg/dL (ref 0–99)

## 2012-09-07 LAB — POCT GLYCOSYLATED HEMOGLOBIN (HGB A1C): Hemoglobin A1C: 7.6

## 2012-09-07 MED ORDER — INSULIN PEN NEEDLE 31G X 5 MM MISC: Status: DC

## 2012-09-07 MED ORDER — ENALAPRIL MALEATE 10 MG PO TABS: 20.0000 mg | ORAL_TABLET | Freq: Every day | ORAL | Status: DC

## 2012-09-07 MED ORDER — INSULIN ASPART 100 UNIT/ML ~~LOC~~ SOLN: SUBCUTANEOUS | Status: DC

## 2012-09-07 MED ORDER — INSULIN GLARGINE 100 UNIT/ML ~~LOC~~ SOLN: 155.0000 [IU] | Freq: Every day | SUBCUTANEOUS | Status: DC

## 2012-09-07 NOTE — Assessment & Plan Note (Signed)
Assessment: Patient has diabetic peripheral neuropathy with chronic pain.  His symptoms are currently well controlled on amitriptyline 75 mg daily and hydrocodone/acetaminophen 5-325 mg 1 and 1/2 tablets every 6 hours as needed for pain.    Plan: Continue current medications.

## 2012-09-07 NOTE — Progress Notes (Signed)
  Subjective:    Patient ID: [name], male    DOB: 08/07/1950, 62 y.o.   MRN: [id number]  HPI Patient returns for followup of his diabetes mellitus, hypertension, hyperlipidemia, left plantar diabetic foot ulcer, and other medical problems.  He has completed a course of hyperbaric treatment for the left plantar diabetic foot ulcer, and reports that the ulcer has healed; he has a large area of callus present, and his physician at the wound clinic has advised offloading diabetic shoes.  He reports that he has been compliant with his medications, and since his hyperbaric treatments have ended he has been taking his usual insulin doses; he had reduced his insulin during the hyperbaric treatments because of episodes of hypoglycemia.  His home blood sugar record shows an average value of 184 over the past month, with a range from 66 to 355.  Patient has no acute complaints today.  Patient's medications, allergies, past medical history, past surgical history, and social history were reviewed.  Review of Systems  Constitutional: Negative for fever, chills and diaphoresis.  Respiratory: Negative for shortness of breath.   Cardiovascular: Negative for chest pain and leg swelling.  Gastrointestinal: Negative for nausea, vomiting, abdominal pain and blood in stool.  Genitourinary: Negative for dysuria, frequency and difficulty urinating.  Neurological: Negative for dizziness.       Objective:   Physical Exam  Constitutional: No distress.  Cardiovascular: Normal rate, regular rhythm and normal heart sounds.  Exam reveals no gallop and no friction rub.   No murmur heard. Trace bilateral ankle edema  Pulmonary/Chest: Effort normal and breath sounds normal. He has no wheezes. He has no rales.  Abdominal: Soft. Bowel sounds are normal. He exhibits no distension. There is no tenderness. There is no rebound and no guarding.  Skin:          Assessment & Plan:

## 2012-09-07 NOTE — Assessment & Plan Note (Signed)
Lipids:    Component Value Date/Time   CHOL 158 10/08/2011 0857   TRIG 361* 10/08/2011 0857   HDL 24* 10/08/2011 0857   LDLCALC 62 10/08/2011 0857   VLDL 72* 10/08/2011 0857   CHOLHDL 6.6 10/08/2011 0857    Assessment: Patient is doing well on atorvastatin 20 mg daily with no apparent side effects.  Plan: Check a lipid panel today; continue atorvastatin 20 mg daily.

## 2012-09-07 NOTE — Patient Instructions (Signed)
General Instructions: Increase enalapril 10 mg to a dose of 2 tablets once daily. Stop taking the enalapril 2.5 mg tablets.   Treatment Goals:  Goals (1 Years of Data) as of 09/07/12         As of Today As of Today 08/11/12 08/11/12 08/11/12     Blood Pressure    . Blood Pressure < 140/80  160/90 161/85 151/93 157/90 167/91     Result Component    . HEMOGLOBIN A1C < 7.0  7.6        . LDL CALC < 100            Progress Toward Treatment Goals:  Treatment Goal 09/07/2012  Hemoglobin A1C improved  Blood pressure unchanged    Self Care Goals & Plans:  Self Care Goal 09/07/2012  Manage my medications take my medicines as prescribed; bring my medications to every visit; refill my medications on time; follow the sick day instructions if I am sick  Monitor my health keep track of my blood glucose; bring my glucose meter and log to each visit  Eat healthy foods drink diet soda or water instead of juice or soda; eat more vegetables; eat foods that are low in salt  Be physically active -    Home Blood Glucose Monitoring 09/07/2012  Check my blood sugar 3 times a day  When to check my blood sugar before meals     Care Management & Community Referrals:  Referral 09/07/2012  Referrals made for care management support none needed

## 2012-09-07 NOTE — Assessment & Plan Note (Signed)
Lab Results  Component Value Date   CREATININE 1.42* 08/11/2012   CREATININE 1.45* 07/27/2012   CREATININE 1.22 06/01/2012   CREATININE 1.40* 04/06/2012   CREATININE 1.24 02/10/2012     Assessment: Patient has chronic renal insufficiency related to diabetes and hypertension.  Plan: Will check a metabolic panel today.

## 2012-09-07 NOTE — Assessment & Plan Note (Addendum)
Lab Results  Component Value Date   HGBA1C 7.6 09/07/2012   HGBA1C 7.8 06/01/2012   HGBA1C 7.8 04/06/2012     Assessment:  Diabetes control: fair control  Progress toward A1C goal:  improved Comments: Patient had been taking a reduced dose of insulin during his hyperbaric treatments because of episodic hypoglycemia during the treatments; he has now resumed his usual home insulin dose.  Plan: Medications:  continue current medications (Lantus insulin 155 units daily; NovoLog insulin 60 units before breakfast, 58 units before lunch, and 56 units before dinner + correction insulin with meals 1 unit for every 10 mg/dL above 147 mg/dL)  Home glucose monitoring:   Frequency: 3 times a day   Timing: before meals  Instruction/counseling given: discussed foot care  Educational resources provided: brochure  Self management tools provided: copy of home glucose meter download  Other plans: will check a metabolic panel today; patient will followup with his ophthalmologist as scheduled

## 2012-09-07 NOTE — Assessment & Plan Note (Signed)
Assessment: Patient's left foot ulcer with associated callus was being managed at the [facility], and the ulcer has now healed.  He has a large area of callus on the anterior plantar surface of his left foot.  Plan: Prescription given for offloading diabetic shoes, and the form for [employer] was completed at his request.

## 2012-09-07 NOTE — Assessment & Plan Note (Signed)
BP Readings from Last 3 Encounters:  09/07/12 160/90  08/11/12 151/93  07/27/12 140/80    Lab Results  Component Value Date   NA 136 08/11/2012   K 4.6 08/11/2012   CREATININE 1.42* 08/11/2012    Assessment:  Blood pressure control: moderately elevated  Progress toward BP goal:  unchanged   Plan:  Medications:  increase enalapril to a dose of 20 mg daily; continue furosemide 40 mg each morning and 20 mg each evening  Educational resources provided: brochure  Self management tools provided: home blood pressure logbook  Other plans: as above, will check a metabolic panel today.

## 2012-09-29 ENCOUNTER — Ambulatory Visit (INDEPENDENT_AMBULATORY_CARE_PROVIDER_SITE_OTHER): Payer: PRIVATE HEALTH INSURANCE | Admitting: Ophthalmology

## 2012-09-29 DIAGNOSIS — E11311 Type 2 diabetes mellitus with unspecified diabetic retinopathy with macular edema: Secondary | ICD-10-CM

## 2012-09-29 DIAGNOSIS — E11319 Type 2 diabetes mellitus with unspecified diabetic retinopathy without macular edema: Secondary | ICD-10-CM

## 2012-09-29 DIAGNOSIS — E1165 Type 2 diabetes mellitus with hyperglycemia: Secondary | ICD-10-CM

## 2012-09-29 DIAGNOSIS — H35039 Hypertensive retinopathy, unspecified eye: Secondary | ICD-10-CM

## 2012-09-29 DIAGNOSIS — I10 Essential (primary) hypertension: Secondary | ICD-10-CM

## 2012-09-29 DIAGNOSIS — E11359 Type 2 diabetes mellitus with proliferative diabetic retinopathy without macular edema: Secondary | ICD-10-CM

## 2012-10-18 ENCOUNTER — Other Ambulatory Visit: Payer: Self-pay | Admitting: Internal Medicine

## 2012-10-18 ENCOUNTER — Other Ambulatory Visit: Payer: Self-pay | Admitting: *Deleted

## 2012-10-18 MED ORDER — HYDROCODONE-ACETAMINOPHEN 5-325 MG PO TABS: ORAL_TABLET | ORAL | Status: DC

## 2012-10-18 NOTE — Telephone Encounter (Signed)
Refill approved - nurse to call in. 

## 2012-10-18 NOTE — Telephone Encounter (Signed)
Rx called in to pharmacy. 

## 2012-11-02 ENCOUNTER — Ambulatory Visit (INDEPENDENT_AMBULATORY_CARE_PROVIDER_SITE_OTHER): Payer: PRIVATE HEALTH INSURANCE | Admitting: Internal Medicine

## 2012-11-02 ENCOUNTER — Encounter: Payer: Self-pay | Admitting: Internal Medicine

## 2012-11-02 ENCOUNTER — Encounter (HOSPITAL_BASED_OUTPATIENT_CLINIC_OR_DEPARTMENT_OTHER): Payer: PRIVATE HEALTH INSURANCE | Attending: General Surgery

## 2012-11-02 VITALS — BP 121/82 | HR 102 | Temp 98.7°F | Wt 399.8 lb

## 2012-11-02 DIAGNOSIS — S98139A Complete traumatic amputation of one unspecified lesser toe, initial encounter: Secondary | ICD-10-CM | POA: Insufficient documentation

## 2012-11-02 DIAGNOSIS — I10 Essential (primary) hypertension: Secondary | ICD-10-CM

## 2012-11-02 DIAGNOSIS — L97509 Non-pressure chronic ulcer of other part of unspecified foot with unspecified severity: Secondary | ICD-10-CM | POA: Insufficient documentation

## 2012-11-02 DIAGNOSIS — E1169 Type 2 diabetes mellitus with other specified complication: Secondary | ICD-10-CM | POA: Insufficient documentation

## 2012-11-02 DIAGNOSIS — L84 Corns and callosities: Secondary | ICD-10-CM | POA: Insufficient documentation

## 2012-11-02 DIAGNOSIS — Z7982 Long term (current) use of aspirin: Secondary | ICD-10-CM | POA: Insufficient documentation

## 2012-11-02 DIAGNOSIS — E669 Obesity, unspecified: Secondary | ICD-10-CM | POA: Insufficient documentation

## 2012-11-02 DIAGNOSIS — E782 Mixed hyperlipidemia: Secondary | ICD-10-CM

## 2012-11-02 DIAGNOSIS — N289 Disorder of kidney and ureter, unspecified: Secondary | ICD-10-CM

## 2012-11-02 DIAGNOSIS — E11621 Type 2 diabetes mellitus with foot ulcer: Secondary | ICD-10-CM

## 2012-11-02 DIAGNOSIS — Z794 Long term (current) use of insulin: Secondary | ICD-10-CM | POA: Insufficient documentation

## 2012-11-02 DIAGNOSIS — Z79899 Other long term (current) drug therapy: Secondary | ICD-10-CM | POA: Insufficient documentation

## 2012-11-02 LAB — COMPLETE METABOLIC PANEL WITH GFR
Albumin: 4 g/dL (ref 3.5–5.2)
BUN: 27 mg/dL — ABNORMAL HIGH (ref 6–23)
CO2: 27 mEq/L (ref 19–32)
Calcium: 9.1 mg/dL (ref 8.4–10.5)
Chloride: 103 mEq/L (ref 96–112)
GFR, Est African American: 47 mL/min — ABNORMAL LOW
GFR, Est Non African American: 41 mL/min — ABNORMAL LOW
Glucose, Bld: 92 mg/dL (ref 70–99)
Potassium: 4.4 mEq/L (ref 3.5–5.3)
Total Protein: 7.2 g/dL (ref 6.0–8.3)

## 2012-11-02 LAB — CBC WITH DIFFERENTIAL/PLATELET
Basophils Relative: 0 % (ref 0–1)
HCT: 38.2 % — ABNORMAL LOW (ref 39.0–52.0)
Hemoglobin: 12.4 g/dL — ABNORMAL LOW (ref 13.0–17.0)
Lymphocytes Relative: 35 % (ref 12–46)
Lymphs Abs: 1.9 10*3/uL (ref 0.7–4.0)
MCHC: 32.5 g/dL (ref 30.0–36.0)
Monocytes Absolute: 0.6 10*3/uL (ref 0.1–1.0)
Monocytes Relative: 11 % (ref 3–12)
Neutro Abs: 2.8 10*3/uL (ref 1.7–7.7)
Neutrophils Relative %: 51 % (ref 43–77)
RBC: 4.4 MIL/uL (ref 4.22–5.81)
WBC: 5.5 10*3/uL (ref 4.0–10.5)

## 2012-11-02 LAB — GLUCOSE, CAPILLARY
Glucose-Capillary: 93 mg/dL (ref 70–99)
Glucose-Capillary: 162 mg/dL — ABNORMAL HIGH (ref 70–99)

## 2012-11-02 NOTE — Assessment & Plan Note (Addendum)
Assessment: Despite his obtaining offloading diabetic shoes, patient has developed a foot ulcer associated with the chronic callus on the anterior sole of his left foot.  There is a small amount of drainage, but no findings suggesting extensive cellulitis.  Patient's left foot ulcer has been being managed [facility] in the past.    Plan: The plan is to refer to the [facility] for treatment today; an appointment was made for 3:00 PM.  Patient will likely need debridement of this area, and may need an oral antibiotic; Will defer the antibiotic to the wound and hyperbar[facility] center physician, since they may want to culture this area prior to starting an antibiotic.

## 2012-11-02 NOTE — Assessment & Plan Note (Signed)
Lab Results  Component Value Date   HGBA1C 7.6 09/07/2012   HGBA1C 7.8 06/01/2012   HGBA1C 7.8 04/06/2012     Assessment: Diabetes control: fair control Progress toward A1C goal:  unchanged Comments: Patient's blood sugars have ranged from a few values in the high 50s to as high as 400; his low blood sugars are related to eating less or skipping meals.  Plan: Medications:  continue current medications Home glucose monitoring: Frequency: 3 times a day Timing: before meals Instruction/counseling given: discussed foot care Educational resources provided: brochure Self management tools provided: home glucose logbook;copy of home glucose meter download

## 2012-11-02 NOTE — Patient Instructions (Addendum)
General Instructions: Please see the physician today at the [facility] for management of your diabetic foot ulcer. Continue current medications.   Treatment Goals:  Goals (1 Years of Data) as of 11/02/12         As of Today 09/07/12 09/07/12 08/11/12 08/11/12     Blood Pressure    . Blood Pressure < 140/80  121/82 160/90 161/85 151/93 157/90     Result Component    . HEMOGLOBIN A1C < 7.0   7.6       . LDL CALC < 100   66         Progress Toward Treatment Goals:  Treatment Goal 11/02/2012  Hemoglobin A1C unchanged  Blood pressure at goal    Self Care Goals & Plans:  Self Care Goal 11/02/2012  Manage my medications take my medicines as prescribed; bring my medications to every visit  Monitor my health keep track of my blood glucose; bring my glucose meter and log to each visit; check my feet daily; keep track of my blood pressure  Eat healthy foods eat foods that are low in salt; eat smaller portions; eat baked foods instead of fried foods  Be physically active find an activity I enjoy    Home Blood Glucose Monitoring 11/02/2012  Check my blood sugar 3 times a day  When to check my blood sugar before meals     Care Management & Community Referrals:  Referral 11/02/2012  Referrals made for care management support none needed  Referrals made to community resources none

## 2012-11-02 NOTE — Progress Notes (Signed)
  Subjective:    Patient ID: [name], male    DOB: 06/20/1951, 62 y.o.   MRN: [id number]  HPI Patient returns for follow-up of his diabetes mellitus, hypertension, hyperlipidemia, and other chronic medical problems; he also reports today that an area has opened up on his left foot in the location of a large chronic callus, with a small amount of drainage.  He does not feel that the offloading shoes provided recently are adequately offloading pressure from that area of his foot.  He reports that he is compliant with his medications.  His blood sugars over the past month have ranged from the high 50s to as high as 400; about 75% of values have been above target, with a few low values which patient attributes to eating less on occasion or skipping meals.  His chronic left lower extremity pain is well-controlled with his current dose of hydrocodone/acetaminophen.   Review of Systems  Constitutional: Negative for fever, chills and fatigue.  Respiratory: Negative for cough, shortness of breath and wheezing.   Cardiovascular: Negative for chest pain and leg swelling.  Gastrointestinal: Positive for nausea (Occasional). Negative for vomiting, abdominal pain, blood in stool and anal bleeding.  Endocrine: Negative for polydipsia, polyphagia and polyuria.  Genitourinary: Negative for dysuria and frequency.  Skin: Positive for wound (Left foot wound re-opened).  Neurological: Negative for dizziness and syncope.       Objective:   Physical Exam  Constitutional: No distress.  Cardiovascular: Normal rate, regular rhythm and normal heart sounds.  Exam reveals no gallop and no friction rub.   No murmur heard. Pulmonary/Chest: Effort normal and breath sounds normal. No respiratory distress. He has no wheezes. He has no rales.  Abdominal: Soft. Bowel sounds are normal. He exhibits no distension. There is no tenderness. There is no rebound and no guarding.  Musculoskeletal: He exhibits no edema.    Skin:          Assessment & Plan:

## 2012-11-02 NOTE — Assessment & Plan Note (Signed)
BP Readings from Last 3 Encounters:  11/02/12 121/82  09/07/12 160/90  08/11/12 151/93    Lab Results  Component Value Date   NA 137 09/07/2012   K 4.5 09/07/2012   CREATININE 1.43* 09/07/2012    Assessment: Blood pressure control: controlled Progress toward BP goal:  at goal Comments: Patient's blood pressure is well controlled on enalapril 20 mg daily, and furosemide 40 mg each morning and 20 mg each evening.  Plan: Medications:  continue current medications Educational resources provided: brochure;video Self management tools provided: home blood pressure logbook

## 2012-11-02 NOTE — Assessment & Plan Note (Signed)
Lipids:    Component Value Date/Time   CHOL 147 09/07/2012 1011   TRIG 247* 09/07/2012 1011   HDL 32* 09/07/2012 1011   LDLCALC 66 09/07/2012 1011   VLDL 49* 09/07/2012 1011   CHOLHDL 4.6 09/07/2012 1011    Assessment: LDL is at goal on atorvastatin 20 mg daily with no apparent side effects.  Plan: Continue atorvastatin 20 mg daily.

## 2012-11-03 ENCOUNTER — Telehealth: Payer: Self-pay | Admitting: *Deleted

## 2012-11-03 NOTE — Telephone Encounter (Signed)
Received refill request from [facility] for [employer] - they did not received last rx refill dated 2/26 per Dr Meredith Pel. Verbal order given to the pharmacist.

## 2012-11-03 NOTE — Progress Notes (Signed)
[facility]  NAME:  [name], [name] NO.:  [id number]  MEDICAL RECORD NO.:  [id number]      DATE OF BIRTH:  14-Oct-1950  PHYSICIAN:  Ardath Sax, M.D.           VISIT DATE:                                  OFFICE VISIT   [name] comes back to the [facility] for another visit because of his diabetic foot ulcer.  This man has been treated with hyperbaric oxygen and total contact cast.  He has a diabetic foot ulcer 1 cm in diameter.  I classified as a Wagner 2.  I debrided it of callus today and we got a culture and I put him on a silver alginate dressing.  His vital signs today show he weighs 400 pounds.  His temperature is 98, pulse 78, respirations 18, blood pressure 145/71.  He is afebrile today and we did culture it, but I did not start him on antibiotics.  I put in silver alginate and we put him in an offloading boot.  He has other diagnoses besides his diabetes.  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  He is on Prilosec and on Vasotec.  So, we put him in a dressing.  I will see him next week.  I debrided the callus.  His diagnosis is Wagner 2 diabetic foot ulcer, left foot; hypertension; diabetes; obesity.     Ardath Sax, M.D.     PP/MEDQ  D:  11/02/2012  T:  11/03/2012  Job:  [id number]

## 2012-11-04 NOTE — Progress Notes (Signed)
Patient ID: [name], male   DOB: [birth year], 62 y.o.   MRN: [id number] Creatinine was somewhat above baseline; plan is to recheck in 2 weeks.

## 2012-11-04 NOTE — Addendum Note (Signed)
Addended by: Margarito Liner on: 11/04/2012 04:06 PM   Modules accepted: Orders

## 2012-11-10 ENCOUNTER — Encounter (INDEPENDENT_AMBULATORY_CARE_PROVIDER_SITE_OTHER): Payer: PRIVATE HEALTH INSURANCE | Admitting: Ophthalmology

## 2012-11-10 DIAGNOSIS — E11319 Type 2 diabetes mellitus with unspecified diabetic retinopathy without macular edema: Secondary | ICD-10-CM

## 2012-11-10 DIAGNOSIS — E11311 Type 2 diabetes mellitus with unspecified diabetic retinopathy with macular edema: Secondary | ICD-10-CM

## 2012-11-10 DIAGNOSIS — E11359 Type 2 diabetes mellitus with proliferative diabetic retinopathy without macular edema: Secondary | ICD-10-CM

## 2012-11-10 DIAGNOSIS — E1139 Type 2 diabetes mellitus with other diabetic ophthalmic complication: Secondary | ICD-10-CM

## 2012-11-10 DIAGNOSIS — H2510 Age-related nuclear cataract, unspecified eye: Secondary | ICD-10-CM

## 2012-11-10 DIAGNOSIS — I10 Essential (primary) hypertension: Secondary | ICD-10-CM

## 2012-11-10 DIAGNOSIS — H43819 Vitreous degeneration, unspecified eye: Secondary | ICD-10-CM

## 2012-11-10 DIAGNOSIS — H35039 Hypertensive retinopathy, unspecified eye: Secondary | ICD-10-CM

## 2012-11-10 DIAGNOSIS — E1165 Type 2 diabetes mellitus with hyperglycemia: Secondary | ICD-10-CM

## 2012-11-16 ENCOUNTER — Encounter (HOSPITAL_BASED_OUTPATIENT_CLINIC_OR_DEPARTMENT_OTHER): Payer: PRIVATE HEALTH INSURANCE | Attending: General Surgery

## 2012-11-16 DIAGNOSIS — L84 Corns and callosities: Secondary | ICD-10-CM | POA: Insufficient documentation

## 2012-11-16 DIAGNOSIS — E1169 Type 2 diabetes mellitus with other specified complication: Secondary | ICD-10-CM | POA: Insufficient documentation

## 2012-11-16 DIAGNOSIS — L97409 Non-pressure chronic ulcer of unspecified heel and midfoot with unspecified severity: Secondary | ICD-10-CM | POA: Insufficient documentation

## 2012-12-01 ENCOUNTER — Encounter: Payer: Self-pay | Admitting: Internal Medicine

## 2012-12-01 ENCOUNTER — Ambulatory Visit: Payer: PRIVATE HEALTH INSURANCE | Admitting: Internal Medicine

## 2012-12-05 ENCOUNTER — Telehealth: Payer: Self-pay | Admitting: Internal Medicine

## 2012-12-05 NOTE — Telephone Encounter (Signed)
  INTERNAL MEDICINE RESIDENCY PROGRAM After-Hours Telephone Call    Reason for call:   I placed an outgoing call to [name] at 9:00 am. Patient reports that he tested his blood sugars 5 times this morning and they all showed "E-9". He states that he feels fine. Denies SOB, sweating, palpitation or dizziness. He uses Accu check meter. Last CBG was 155 ac dinner yesterday. Patient reports medical compliance to his insulin regimen.     Last encounter / Pertinent Data:   Last seen in the Clinic by Dr. Meredith Pel for follow up. Last HGB A1C was 7.6 on 09/07/12.     Assessment/ Plan:   I have checked Accu-check online instruction, which reveals that the E-9 stands for " the test strip was not inserted correctly. It was removed during testing, or blood was applied too soon. Repeat the test with a new test strip until the arrowheads are no longer visible, with the orange square facing up."  I instructed patient to recheck his CBG based on above instruction, and sh should page me if his CBG is too high or too low (< 70).  As always, pt is advised that if symptoms worsen or new symptoms arise, they should go to an urgent care facility or to to ER for further evaluation.    Dede Query, MD   12/05/2012, 9:09 AM

## 2012-12-06 ENCOUNTER — Other Ambulatory Visit: Payer: Self-pay | Admitting: *Deleted

## 2012-12-06 MED ORDER — FUROSEMIDE 20 MG PO TABS: ORAL_TABLET | ORAL | Status: DC

## 2012-12-06 NOTE — Telephone Encounter (Signed)
Refill approved; please have patient come in for a BMP (previously ordered).

## 2012-12-07 ENCOUNTER — Other Ambulatory Visit: Payer: PRIVATE HEALTH INSURANCE

## 2012-12-07 NOTE — Telephone Encounter (Signed)
Pt agreed to come in today for lab work

## 2012-12-08 ENCOUNTER — Other Ambulatory Visit (INDEPENDENT_AMBULATORY_CARE_PROVIDER_SITE_OTHER): Payer: PRIVATE HEALTH INSURANCE

## 2012-12-08 DIAGNOSIS — N289 Disorder of kidney and ureter, unspecified: Secondary | ICD-10-CM

## 2012-12-08 LAB — BASIC METABOLIC PANEL WITH GFR
BUN: 20 mg/dL (ref 6–23)
Calcium: 8.8 mg/dL (ref 8.4–10.5)
GFR, Est African American: 53 mL/min — ABNORMAL LOW
GFR, Est Non African American: 45 mL/min — ABNORMAL LOW
Glucose, Bld: 123 mg/dL — ABNORMAL HIGH (ref 70–99)
Potassium: 4 mEq/L (ref 3.5–5.3)
Sodium: 138 mEq/L (ref 135–145)

## 2012-12-13 ENCOUNTER — Other Ambulatory Visit: Payer: Self-pay | Admitting: *Deleted

## 2012-12-14 ENCOUNTER — Encounter (HOSPITAL_BASED_OUTPATIENT_CLINIC_OR_DEPARTMENT_OTHER): Payer: PRIVATE HEALTH INSURANCE | Attending: General Surgery

## 2012-12-14 DIAGNOSIS — Z79899 Other long term (current) drug therapy: Secondary | ICD-10-CM | POA: Insufficient documentation

## 2012-12-14 DIAGNOSIS — E785 Hyperlipidemia, unspecified: Secondary | ICD-10-CM | POA: Insufficient documentation

## 2012-12-14 DIAGNOSIS — Z794 Long term (current) use of insulin: Secondary | ICD-10-CM | POA: Insufficient documentation

## 2012-12-14 DIAGNOSIS — L97809 Non-pressure chronic ulcer of other part of unspecified lower leg with unspecified severity: Secondary | ICD-10-CM | POA: Insufficient documentation

## 2012-12-14 DIAGNOSIS — S98139A Complete traumatic amputation of one unspecified lesser toe, initial encounter: Secondary | ICD-10-CM | POA: Insufficient documentation

## 2012-12-14 DIAGNOSIS — Z7982 Long term (current) use of aspirin: Secondary | ICD-10-CM | POA: Insufficient documentation

## 2012-12-14 DIAGNOSIS — E1169 Type 2 diabetes mellitus with other specified complication: Secondary | ICD-10-CM | POA: Insufficient documentation

## 2012-12-14 MED ORDER — PROMETHAZINE HCL 25 MG PO TABS: 12.5000 mg | ORAL_TABLET | Freq: Four times a day (QID) | ORAL | Status: DC | PRN

## 2012-12-14 NOTE — Progress Notes (Signed)
[facility]  NAME:  [name], [name] NO.:  [id number]  MEDICAL RECORD NO.:  [id number]      DATE OF BIRTH:  August 27, 1950  PHYSICIAN:  Wayland Denis, DO       VISIT DATE:  12/14/2012                                  OFFICE VISIT   HISTORY:  The patient is a 62 year old male who is here for followup on his left lower extremity, chronic diabetic ulcer, Wagner 3.  He has undergone series of treatments with Endoform, silver alginate.  He, at times, heels the area and then it breaks down again.  He does not have very good healing.  The area is 0.8 x 0.4 x 0.2 of the left plantar area.  There does not appear to be any drainage and once the area was debrided, the base of it looks red and pink in color.  PAST MEDICAL HISTORY:  Positive for diabetes, neuropathy, hyperlipidemia, and foot ulcer.  PAST SURGICAL HISTORY:  His surgery includes third toe amputation, MRSA, and bilateral eye surgery.  MEDICATIONS:  Albuterol, Elavil, aspirin, Lipitor, Vasotec, Lasix, hydrocodone, NovoLog, Lantus insulin, multivitamin, Prilosec, and Viagra.  ALLERGIES:  May be VANCOMYCIN.  PHYSICAL EXAMINATION:  GENERAL:  He is alert, oriented, cooperative, not in any acute distress.  He is pleasant. HEENT:  Pupils equal.  Extraocular muscles are intact. NECK:  No cervical lymphadenopathy. LUNGS:  His breathing is unlabored. HEART:  Regular. ABDOMEN:  Soft.  Debridement was done, is noted in the note.  Pulses are weak, but present.  He has capillary refill.  We will put him in a total noncontact cast and continue with the alginate and see him back in a week.     Wayland Denis, DO     CS/MEDQ  D:  12/14/2012  T:  12/14/2012  Job:  [id number]

## 2012-12-22 ENCOUNTER — Encounter (INDEPENDENT_AMBULATORY_CARE_PROVIDER_SITE_OTHER): Payer: PRIVATE HEALTH INSURANCE | Admitting: Ophthalmology

## 2012-12-22 DIAGNOSIS — H2510 Age-related nuclear cataract, unspecified eye: Secondary | ICD-10-CM

## 2012-12-22 DIAGNOSIS — E11359 Type 2 diabetes mellitus with proliferative diabetic retinopathy without macular edema: Secondary | ICD-10-CM

## 2012-12-22 DIAGNOSIS — E11319 Type 2 diabetes mellitus with unspecified diabetic retinopathy without macular edema: Secondary | ICD-10-CM

## 2012-12-22 DIAGNOSIS — E11311 Type 2 diabetes mellitus with unspecified diabetic retinopathy with macular edema: Secondary | ICD-10-CM

## 2012-12-22 DIAGNOSIS — I10 Essential (primary) hypertension: Secondary | ICD-10-CM

## 2012-12-22 DIAGNOSIS — H35039 Hypertensive retinopathy, unspecified eye: Secondary | ICD-10-CM

## 2012-12-22 DIAGNOSIS — H43819 Vitreous degeneration, unspecified eye: Secondary | ICD-10-CM

## 2012-12-22 DIAGNOSIS — E1139 Type 2 diabetes mellitus with other diabetic ophthalmic complication: Secondary | ICD-10-CM

## 2012-12-28 ENCOUNTER — Encounter: Payer: Self-pay | Admitting: Internal Medicine

## 2012-12-28 ENCOUNTER — Ambulatory Visit (INDEPENDENT_AMBULATORY_CARE_PROVIDER_SITE_OTHER): Payer: PRIVATE HEALTH INSURANCE | Admitting: Internal Medicine

## 2012-12-28 VITALS — BP 128/81 | HR 85 | Temp 97.0°F | Ht 72.0 in | Wt >= 6400 oz

## 2012-12-28 DIAGNOSIS — I129 Hypertensive chronic kidney disease with stage 1 through stage 4 chronic kidney disease, or unspecified chronic kidney disease: Secondary | ICD-10-CM

## 2012-12-28 DIAGNOSIS — N189 Chronic kidney disease, unspecified: Secondary | ICD-10-CM

## 2012-12-28 DIAGNOSIS — E782 Mixed hyperlipidemia: Secondary | ICD-10-CM

## 2012-12-28 DIAGNOSIS — I10 Essential (primary) hypertension: Secondary | ICD-10-CM

## 2012-12-28 LAB — GLUCOSE, CAPILLARY: Glucose-Capillary: 191 mg/dL — ABNORMAL HIGH (ref 70–99)

## 2012-12-28 MED ORDER — INSULIN ASPART 100 UNIT/ML ~~LOC~~ SOLN: SUBCUTANEOUS | Status: DC

## 2012-12-28 MED ORDER — ZOSTER VACCINE LIVE 19400 UNT/0.65ML ~~LOC~~ SOLR: 0.6500 mL | Freq: Once | SUBCUTANEOUS | Status: DC

## 2012-12-28 NOTE — Assessment & Plan Note (Addendum)
Lipids:    Component Value Date/Time   CHOL 147 09/07/2012 1011   TRIG 247* 09/07/2012 1011   HDL 32* 09/07/2012 1011   LDLCALC 66 09/07/2012 1011   VLDL 49* 09/07/2012 1011   CHOLHDL 4.6 09/07/2012 1011    Assessment: LDL is doing well on atorvastatin 20 mg daily with no apparent side effects.  Plan: Continue atorvastatin 20 mg daily.

## 2012-12-28 NOTE — Progress Notes (Signed)
  Subjective:    Patient ID: [name], male    DOB: Aug 26, 1950, 62 y.o.   MRN: [id number]  HPI Patient presents for followup of his diabetes mellitus, hypertension, and other chronic medical problems.  He is again under active care at the [facility] for his left diabetic foot ulcer, and is currently in a non-contact cast which is being changed weekly.  His home blood glucose measurements have been consistently elevated, with about 85% of values being above target range.  He has recently increased his mealtime NovoLog insulin to a dose of 60 units with each meal plus correction insulin, and has been taking his usual dose of Lantus 105 units daily; he denies missing doses of his insulin, and reports that he has been compliant with his regimen.       Review of Systems  Constitutional: Negative for fever, chills and diaphoresis.  Respiratory: Negative for cough and wheezing.   Cardiovascular: Negative for chest pain.  Gastrointestinal: Negative for nausea, vomiting and abdominal pain.  Genitourinary: Negative for dysuria and difficulty urinating.       Objective:   Physical Exam  Constitutional: No distress.  Cardiovascular: Normal rate, regular rhythm and normal heart sounds.  Exam reveals no gallop and no friction rub.   No murmur heard. Pulmonary/Chest: Effort normal and breath sounds normal. No respiratory distress. He has no wheezes. He has no rales.  Abdominal: Soft. Bowel sounds are normal. He exhibits no distension. There is no tenderness. There is no rebound.  Musculoskeletal:       Feet:          Assessment & Plan:

## 2012-12-28 NOTE — Assessment & Plan Note (Addendum)
Lab Results  Component Value Date   HGBA1C 8.6 12/28/2012   HGBA1C 7.6 09/07/2012   HGBA1C 7.8 06/01/2012     Assessment: Diabetes control: fair control Progress toward A1C goal:  deteriorated Comments: The cause of the increased A1c compared to February of this year is not clear; patient has recently been taking Lantus insulin 155 units daily and NovoLog insulin 60 units with each meal plus correction insulin based on pre-meal blood glucose as before.  Plan: Medications:  Increase  NovoLog insulin to a dose of 62 units with breakfast, 60 units with lunch, and 62 units with supper plus correction insulin as before; continue Lantus insulin 155 units daily Home glucose monitoring: Frequency: 3 times a day Timing: before meals Instruction/counseling given: discussed foot care Other plans: Check a basic metabolic panel today.

## 2012-12-28 NOTE — Assessment & Plan Note (Signed)
Lab Results  Component Value Date   CREATININE 1.60* 12/08/2012   CREATININE 1.75* 11/02/2012   CREATININE 1.43* 09/07/2012   CREATININE 1.42* 08/11/2012   CREATININE 1.45* 07/27/2012     Assessment: Patient has stable chronic renal insufficiency related to diabetes and hypertension.  Plan: Will check a metabolic panel today.

## 2012-12-28 NOTE — Patient Instructions (Signed)
General Instructions: Increase Novolog insulin to a dose of 62 units before breakfast, 60 units before lunch, and 62 units before dinner, plus 1 unit for every 10 mg/dl above 409 mg/dl.   Progress Toward Treatment Goals:  Treatment Goal 12/28/2012  Hemoglobin A1C deteriorated  Blood pressure at goal    Self Care Goals & Plans:  Self Care Goal 12/28/2012  Manage my medications take my medicines as prescribed; bring my medications to every visit  Monitor my health keep track of my blood glucose; bring my glucose meter and log to each visit; keep track of my blood pressure  Eat healthy foods eat foods that are low in salt; eat smaller portions; eat baked foods instead of fried foods; drink diet soda or water instead of juice or soda  Be physically active -    Home Blood Glucose Monitoring 12/28/2012  Check my blood sugar 3 times a day  When to check my blood sugar before meals     Care Management & Community Referrals:  Referral 12/28/2012  Referrals made for care management support none needed  Referrals made to community resources none

## 2012-12-28 NOTE — Assessment & Plan Note (Signed)
BP Readings from Last 3 Encounters:  12/28/12 128/81  11/02/12 121/82  09/07/12 160/90    Lab Results  Component Value Date   NA 138 12/08/2012   K 4.0 12/08/2012   CREATININE 1.60* 12/08/2012    Assessment: Blood pressure control: controlled Progress toward BP goal:  at goal Comments: Blood pressure is at goal on enalapril 20 mg daily and furosemide 40 mg each morning and 20 mg each evening; patient has also recently been on acetazolamide 250 mg twice a day prescribed by his ophthalmologist.  Plan: Medications:  continue current medications

## 2012-12-29 LAB — BASIC METABOLIC PANEL WITH GFR
Chloride: 107 mEq/L (ref 96–112)
Creat: 1.51 mg/dL — ABNORMAL HIGH (ref 0.50–1.35)
GFR, Est Non African American: 49 mL/min — ABNORMAL LOW
Potassium: 4.7 mEq/L (ref 3.5–5.3)
Sodium: 136 mEq/L (ref 135–145)

## 2013-01-10 ENCOUNTER — Other Ambulatory Visit: Payer: Self-pay | Admitting: *Deleted

## 2013-01-10 MED ORDER — HYDROCODONE-ACETAMINOPHEN 5-325 MG PO TABS: ORAL_TABLET | ORAL | Status: DC

## 2013-01-10 NOTE — Telephone Encounter (Signed)
Refill approved - nurse to call in. 

## 2013-01-10 NOTE — Telephone Encounter (Signed)
Rx called in to pharmacy. 

## 2013-01-11 ENCOUNTER — Encounter (HOSPITAL_BASED_OUTPATIENT_CLINIC_OR_DEPARTMENT_OTHER): Payer: PRIVATE HEALTH INSURANCE | Attending: General Surgery

## 2013-01-11 DIAGNOSIS — E1169 Type 2 diabetes mellitus with other specified complication: Secondary | ICD-10-CM | POA: Insufficient documentation

## 2013-01-11 DIAGNOSIS — L97509 Non-pressure chronic ulcer of other part of unspecified foot with unspecified severity: Secondary | ICD-10-CM | POA: Insufficient documentation

## 2013-01-16 ENCOUNTER — Other Ambulatory Visit: Payer: Self-pay | Admitting: Internal Medicine

## 2013-01-18 ENCOUNTER — Ambulatory Visit (HOSPITAL_COMMUNITY)
Admission: RE | Admit: 2013-01-18 | Discharge: 2013-01-18 | Disposition: A | Discharge status: Home / Self Care | Payer: PRIVATE HEALTH INSURANCE | Source: Ambulatory Visit | Attending: General Surgery | Admitting: General Surgery

## 2013-01-18 ENCOUNTER — Other Ambulatory Visit (HOSPITAL_BASED_OUTPATIENT_CLINIC_OR_DEPARTMENT_OTHER): Payer: Self-pay | Admitting: General Surgery

## 2013-01-18 ENCOUNTER — Telehealth: Payer: Self-pay | Admitting: *Deleted

## 2013-01-18 DIAGNOSIS — X58XXXA Exposure to other specified factors, initial encounter: Secondary | ICD-10-CM | POA: Insufficient documentation

## 2013-01-18 DIAGNOSIS — M869 Osteomyelitis, unspecified: Secondary | ICD-10-CM

## 2013-01-18 DIAGNOSIS — S91109A Unspecified open wound of unspecified toe(s) without damage to nail, initial encounter: Secondary | ICD-10-CM | POA: Insufficient documentation

## 2013-01-18 NOTE — Telephone Encounter (Signed)
Problems with occ light headed feeling since dx diabetes. It was suggested to move slower.I offered an appt to be seen in clinic - will wait. It comes and goes. To call if any change. CBG are doing well. Stanton Kidney Yohanna Tow RN 01/18/13 3:30PM

## 2013-01-23 ENCOUNTER — Telehealth: Payer: Self-pay | Admitting: *Deleted

## 2013-01-23 NOTE — Telephone Encounter (Signed)
CPAP machine is not working - has call in to [facility]. Not sleeping - pt wants few sleeping pills so he can sleep. Uses [facility] / [address].  Stanton Kidney Tayleigh Wetherell RN 01/23/13 2:45PM

## 2013-01-23 NOTE — Telephone Encounter (Signed)
Patient needs to be back on CPAP.  I don't think adding a medication for insomnia would be a good idea given his sleep apnea.

## 2013-01-24 NOTE — Telephone Encounter (Signed)
Talked with pt  about Dr Meredith Pel response to start on a sleeping pill.- machine was fixed today.

## 2013-01-26 ENCOUNTER — Ambulatory Visit (HOSPITAL_COMMUNITY)
Admission: RE | Admit: 2013-01-26 | Discharge: 2013-01-26 | Disposition: A | Discharge status: Home / Self Care | Payer: PRIVATE HEALTH INSURANCE | Source: Ambulatory Visit | Attending: General Surgery | Admitting: General Surgery

## 2013-01-26 ENCOUNTER — Other Ambulatory Visit (HOSPITAL_BASED_OUTPATIENT_CLINIC_OR_DEPARTMENT_OTHER): Payer: Self-pay | Admitting: General Surgery

## 2013-01-26 ENCOUNTER — Encounter (INDEPENDENT_AMBULATORY_CARE_PROVIDER_SITE_OTHER): Payer: PRIVATE HEALTH INSURANCE | Admitting: Ophthalmology

## 2013-01-26 DIAGNOSIS — E11319 Type 2 diabetes mellitus with unspecified diabetic retinopathy without macular edema: Secondary | ICD-10-CM

## 2013-01-26 DIAGNOSIS — H43819 Vitreous degeneration, unspecified eye: Secondary | ICD-10-CM

## 2013-01-26 DIAGNOSIS — E11359 Type 2 diabetes mellitus with proliferative diabetic retinopathy without macular edema: Secondary | ICD-10-CM

## 2013-01-26 DIAGNOSIS — E1165 Type 2 diabetes mellitus with hyperglycemia: Secondary | ICD-10-CM

## 2013-01-26 DIAGNOSIS — H35039 Hypertensive retinopathy, unspecified eye: Secondary | ICD-10-CM

## 2013-01-26 DIAGNOSIS — Z01818 Encounter for other preprocedural examination: Secondary | ICD-10-CM | POA: Insufficient documentation

## 2013-01-26 DIAGNOSIS — E11311 Type 2 diabetes mellitus with unspecified diabetic retinopathy with macular edema: Secondary | ICD-10-CM

## 2013-01-26 DIAGNOSIS — I10 Essential (primary) hypertension: Secondary | ICD-10-CM

## 2013-02-08 LAB — GLUCOSE, CAPILLARY
Glucose-Capillary: 224 mg/dL — ABNORMAL HIGH (ref 70–99)
Glucose-Capillary: 142 mg/dL — ABNORMAL HIGH (ref 70–99)

## 2013-02-10 ENCOUNTER — Encounter (HOSPITAL_BASED_OUTPATIENT_CLINIC_OR_DEPARTMENT_OTHER): Payer: PRIVATE HEALTH INSURANCE | Attending: General Surgery

## 2013-02-10 DIAGNOSIS — M869 Osteomyelitis, unspecified: Secondary | ICD-10-CM | POA: Insufficient documentation

## 2013-02-10 DIAGNOSIS — L97509 Non-pressure chronic ulcer of other part of unspecified foot with unspecified severity: Secondary | ICD-10-CM | POA: Insufficient documentation

## 2013-02-10 DIAGNOSIS — L84 Corns and callosities: Secondary | ICD-10-CM | POA: Insufficient documentation

## 2013-02-10 DIAGNOSIS — E1169 Type 2 diabetes mellitus with other specified complication: Secondary | ICD-10-CM | POA: Insufficient documentation

## 2013-02-10 LAB — GLUCOSE, CAPILLARY: Glucose-Capillary: 176 mg/dL — ABNORMAL HIGH (ref 70–99)

## 2013-02-15 LAB — GLUCOSE, CAPILLARY: Glucose-Capillary: 182 mg/dL — ABNORMAL HIGH (ref 70–99)

## 2013-02-16 LAB — GLUCOSE, CAPILLARY
Glucose-Capillary: 136 mg/dL — ABNORMAL HIGH (ref 70–99)
Glucose-Capillary: 72 mg/dL (ref 70–99)
Glucose-Capillary: 279 mg/dL — ABNORMAL HIGH (ref 70–99)
Glucose-Capillary: 192 mg/dL — ABNORMAL HIGH (ref 70–99)

## 2013-02-17 LAB — GLUCOSE, CAPILLARY: Glucose-Capillary: 237 mg/dL — ABNORMAL HIGH (ref 70–99)

## 2013-02-22 LAB — GLUCOSE, CAPILLARY
Glucose-Capillary: 221 mg/dL — ABNORMAL HIGH (ref 70–99)
Glucose-Capillary: 143 mg/dL — ABNORMAL HIGH (ref 70–99)

## 2013-02-23 LAB — GLUCOSE, CAPILLARY: Glucose-Capillary: 282 mg/dL — ABNORMAL HIGH (ref 70–99)

## 2013-02-24 ENCOUNTER — Other Ambulatory Visit: Payer: Self-pay | Admitting: Internal Medicine

## 2013-02-24 MED ORDER — ATORVASTATIN CALCIUM 20 MG PO TABS: 20.0000 mg | ORAL_TABLET | Freq: Every day | ORAL | Status: DC

## 2013-02-27 ENCOUNTER — Other Ambulatory Visit (INDEPENDENT_AMBULATORY_CARE_PROVIDER_SITE_OTHER): Payer: PRIVATE HEALTH INSURANCE | Admitting: Ophthalmology

## 2013-02-27 DIAGNOSIS — H3581 Retinal edema: Secondary | ICD-10-CM

## 2013-02-28 LAB — GLUCOSE, CAPILLARY
Glucose-Capillary: 207 mg/dL — ABNORMAL HIGH (ref 70–99)
Glucose-Capillary: 142 mg/dL — ABNORMAL HIGH (ref 70–99)

## 2013-03-02 ENCOUNTER — Encounter (INDEPENDENT_AMBULATORY_CARE_PROVIDER_SITE_OTHER): Payer: PRIVATE HEALTH INSURANCE | Admitting: Ophthalmology

## 2013-03-02 DIAGNOSIS — E11319 Type 2 diabetes mellitus with unspecified diabetic retinopathy without macular edema: Secondary | ICD-10-CM

## 2013-03-02 DIAGNOSIS — E11311 Type 2 diabetes mellitus with unspecified diabetic retinopathy with macular edema: Secondary | ICD-10-CM

## 2013-03-02 DIAGNOSIS — E1139 Type 2 diabetes mellitus with other diabetic ophthalmic complication: Secondary | ICD-10-CM

## 2013-03-02 DIAGNOSIS — H35039 Hypertensive retinopathy, unspecified eye: Secondary | ICD-10-CM

## 2013-03-02 DIAGNOSIS — E11359 Type 2 diabetes mellitus with proliferative diabetic retinopathy without macular edema: Secondary | ICD-10-CM

## 2013-03-02 DIAGNOSIS — I10 Essential (primary) hypertension: Secondary | ICD-10-CM

## 2013-03-02 DIAGNOSIS — H43819 Vitreous degeneration, unspecified eye: Secondary | ICD-10-CM

## 2013-03-06 LAB — GLUCOSE, CAPILLARY: Glucose-Capillary: 236 mg/dL — ABNORMAL HIGH (ref 70–99)

## 2013-03-07 LAB — GLUCOSE, CAPILLARY
Glucose-Capillary: 256 mg/dL — ABNORMAL HIGH (ref 70–99)
Glucose-Capillary: 183 mg/dL — ABNORMAL HIGH (ref 70–99)

## 2013-03-08 LAB — GLUCOSE, CAPILLARY: Glucose-Capillary: 232 mg/dL — ABNORMAL HIGH (ref 70–99)

## 2013-03-09 LAB — GLUCOSE, CAPILLARY: Glucose-Capillary: 265 mg/dL — ABNORMAL HIGH (ref 70–99)

## 2013-03-11 ENCOUNTER — Other Ambulatory Visit: Payer: Self-pay | Admitting: Internal Medicine

## 2013-03-14 ENCOUNTER — Encounter (HOSPITAL_BASED_OUTPATIENT_CLINIC_OR_DEPARTMENT_OTHER): Payer: PRIVATE HEALTH INSURANCE | Attending: General Surgery

## 2013-03-14 DIAGNOSIS — L97509 Non-pressure chronic ulcer of other part of unspecified foot with unspecified severity: Secondary | ICD-10-CM | POA: Insufficient documentation

## 2013-03-14 DIAGNOSIS — E1169 Type 2 diabetes mellitus with other specified complication: Secondary | ICD-10-CM | POA: Insufficient documentation

## 2013-03-14 LAB — GLUCOSE, CAPILLARY: Glucose-Capillary: 290 mg/dL — ABNORMAL HIGH (ref 70–99)

## 2013-03-15 LAB — GLUCOSE, CAPILLARY
Glucose-Capillary: 261 mg/dL — ABNORMAL HIGH (ref 70–99)
Glucose-Capillary: 183 mg/dL — ABNORMAL HIGH (ref 70–99)

## 2013-03-17 LAB — GLUCOSE, CAPILLARY: Glucose-Capillary: 213 mg/dL — ABNORMAL HIGH (ref 70–99)

## 2013-03-20 ENCOUNTER — Telehealth: Payer: Self-pay | Admitting: *Deleted

## 2013-03-20 LAB — GLUCOSE, CAPILLARY: Glucose-Capillary: 303 mg/dL — ABNORMAL HIGH (ref 70–99)

## 2013-03-20 MED ORDER — ALBUTEROL SULFATE HFA 108 (90 BASE) MCG/ACT IN AERS: 2.0000 | INHALATION_SPRAY | Freq: Four times a day (QID) | RESPIRATORY_TRACT | Status: DC | PRN

## 2013-03-20 NOTE — Telephone Encounter (Signed)
I refilled as requested

## 2013-03-20 NOTE — Telephone Encounter (Signed)
Needs refill  on [employer] - albuterol inhaler [facility] / [address]. Stanton Kidney Delisha Peaden RN 03/20/13 3PM

## 2013-03-22 LAB — GLUCOSE, CAPILLARY
Glucose-Capillary: 228 mg/dL — ABNORMAL HIGH (ref 70–99)
Glucose-Capillary: 114 mg/dL — ABNORMAL HIGH (ref 70–99)

## 2013-03-23 LAB — GLUCOSE, CAPILLARY: Glucose-Capillary: 342 mg/dL — ABNORMAL HIGH (ref 70–99)

## 2013-03-24 LAB — GLUCOSE, CAPILLARY
Glucose-Capillary: 284 mg/dL — ABNORMAL HIGH (ref 70–99)
Glucose-Capillary: 184 mg/dL — ABNORMAL HIGH (ref 70–99)

## 2013-04-04 ENCOUNTER — Other Ambulatory Visit: Payer: Self-pay | Admitting: Internal Medicine

## 2013-04-06 ENCOUNTER — Encounter (INDEPENDENT_AMBULATORY_CARE_PROVIDER_SITE_OTHER): Payer: PRIVATE HEALTH INSURANCE | Admitting: Ophthalmology

## 2013-04-12 ENCOUNTER — Other Ambulatory Visit: Payer: Self-pay | Admitting: Internal Medicine

## 2013-04-12 ENCOUNTER — Encounter (INDEPENDENT_AMBULATORY_CARE_PROVIDER_SITE_OTHER): Payer: PRIVATE HEALTH INSURANCE | Admitting: Ophthalmology

## 2013-04-12 DIAGNOSIS — H2510 Age-related nuclear cataract, unspecified eye: Secondary | ICD-10-CM

## 2013-04-12 DIAGNOSIS — E11311 Type 2 diabetes mellitus with unspecified diabetic retinopathy with macular edema: Secondary | ICD-10-CM

## 2013-04-12 DIAGNOSIS — I10 Essential (primary) hypertension: Secondary | ICD-10-CM

## 2013-04-12 DIAGNOSIS — E11319 Type 2 diabetes mellitus with unspecified diabetic retinopathy without macular edema: Secondary | ICD-10-CM

## 2013-04-12 DIAGNOSIS — H43819 Vitreous degeneration, unspecified eye: Secondary | ICD-10-CM

## 2013-04-12 DIAGNOSIS — E11359 Type 2 diabetes mellitus with proliferative diabetic retinopathy without macular edema: Secondary | ICD-10-CM

## 2013-04-12 DIAGNOSIS — H35039 Hypertensive retinopathy, unspecified eye: Secondary | ICD-10-CM

## 2013-04-12 DIAGNOSIS — E1139 Type 2 diabetes mellitus with other diabetic ophthalmic complication: Secondary | ICD-10-CM

## 2013-04-12 NOTE — Telephone Encounter (Signed)
Refill approved - nurse to call in. 

## 2013-04-12 NOTE — Telephone Encounter (Signed)
Rx called in 

## 2013-04-17 ENCOUNTER — Other Ambulatory Visit: Payer: Self-pay | Admitting: Internal Medicine

## 2013-05-11 ENCOUNTER — Other Ambulatory Visit: Payer: Self-pay | Admitting: *Deleted

## 2013-05-11 MED ORDER — HYDROCODONE-ACETAMINOPHEN 5-325 MG PO TABS: ORAL_TABLET | ORAL | Status: DC

## 2013-05-11 NOTE — Telephone Encounter (Signed)
Needs written refills.  Last filled 10/1 Pt # [id number]

## 2013-05-11 NOTE — Telephone Encounter (Signed)
Refill printed and signed - nurse to complete. 

## 2013-05-12 NOTE — Telephone Encounter (Signed)
Pt informed Rx is ready 

## 2013-05-13 ENCOUNTER — Other Ambulatory Visit: Payer: Self-pay | Admitting: Internal Medicine

## 2013-05-17 ENCOUNTER — Encounter (INDEPENDENT_AMBULATORY_CARE_PROVIDER_SITE_OTHER): Payer: PRIVATE HEALTH INSURANCE | Admitting: Ophthalmology

## 2013-05-23 ENCOUNTER — Ambulatory Visit: Payer: PRIVATE HEALTH INSURANCE | Admitting: Internal Medicine

## 2013-05-24 ENCOUNTER — Encounter: Payer: PRIVATE HEALTH INSURANCE | Admitting: Internal Medicine

## 2013-05-24 ENCOUNTER — Ambulatory Visit: Payer: PRIVATE HEALTH INSURANCE | Admitting: Internal Medicine

## 2013-05-24 ENCOUNTER — Ambulatory Visit (INDEPENDENT_AMBULATORY_CARE_PROVIDER_SITE_OTHER): Payer: PRIVATE HEALTH INSURANCE | Admitting: Internal Medicine

## 2013-05-24 ENCOUNTER — Encounter: Payer: Self-pay | Admitting: Internal Medicine

## 2013-05-24 VITALS — BP 153/89 | HR 92 | Temp 97.4°F | Ht 78.0 in | Wt 398.0 lb

## 2013-05-24 DIAGNOSIS — E1169 Type 2 diabetes mellitus with other specified complication: Secondary | ICD-10-CM

## 2013-05-24 DIAGNOSIS — E11621 Type 2 diabetes mellitus with foot ulcer: Secondary | ICD-10-CM

## 2013-05-24 DIAGNOSIS — L97509 Non-pressure chronic ulcer of other part of unspecified foot with unspecified severity: Secondary | ICD-10-CM

## 2013-05-24 DIAGNOSIS — N189 Chronic kidney disease, unspecified: Secondary | ICD-10-CM

## 2013-05-24 DIAGNOSIS — R635 Abnormal weight gain: Secondary | ICD-10-CM

## 2013-05-24 DIAGNOSIS — I10 Essential (primary) hypertension: Secondary | ICD-10-CM

## 2013-05-24 DIAGNOSIS — IMO0001 Reserved for inherently not codable concepts without codable children: Secondary | ICD-10-CM

## 2013-05-24 DIAGNOSIS — Z23 Encounter for immunization: Secondary | ICD-10-CM

## 2013-05-24 LAB — GLUCOSE, CAPILLARY: Glucose-Capillary: 194 mg/dL — ABNORMAL HIGH (ref 70–99)

## 2013-05-24 NOTE — Assessment & Plan Note (Signed)
Most recent GFR in 40s and fairly consistent with previous. Will check microalbumin to creatinine ratio today to see if these hard numbers will help to motivate him to make life changes.

## 2013-05-24 NOTE — Assessment & Plan Note (Signed)
Healed and released from the wound center.

## 2013-05-24 NOTE — Assessment & Plan Note (Signed)
BP Readings from Last 3 Encounters:  05/24/13 153/89  12/28/12 128/81  11/02/12 121/82    Lab Results  Component Value Date   NA 136 12/28/2012   K 4.7 12/28/2012   CREATININE 1.51* 12/28/2012    Assessment: Blood pressure control: mildly elevated Progress toward BP goal:  unchanged Comments: Patient is taking his BP meds and did not titrate up therapy given usual good control, forgot to recheck BP at end of visit.   Plan: Medications:  continue current medications Educational resources provided: brochure;handout;video Self management tools provided: instructions for home blood pressure monitoring Other plans:

## 2013-05-24 NOTE — Patient Instructions (Addendum)
We will have you start exercising again and we will have you see Norm Parcel to work on a diet plan.   Continue taking your insulin and take 160 of the lantus in the morning (80 + 80) with your 60 with meals (add 10 for >150 sugar and add 20 for >300).   We have given you your flu shot today.   We will see you back in 2-3 months to check on your sugars and your weight. Work on losing about 10 pounds and increasing your exercise. Our number is [phone] if you have problems or questions.   Exercise to Lose Weight Exercise and a healthy diet may help you lose weight. Your doctor may suggest specific exercises. EXERCISE IDEAS AND TIPS  Choose low-cost things you enjoy doing, such as walking, bicycling, or exercising to workout videos.  Take stairs instead of the elevator.  Walk during your lunch break.  Park your car further away from work or school.  Go to a gym or an exercise class.  Start with 5 to 10 minutes of exercise each day. Build up to 30 minutes of exercise 4 to 6 days a week.  Wear shoes with good support and comfortable clothes.  Stretch before and after working out.  Work out until you breathe harder and your heart beats faster.  Drink extra water when you exercise.  Do not do so much that you hurt yourself, feel dizzy, or get very short of breath. Exercises that burn about 150 calories:  Running 1  miles in 15 minutes.  Playing volleyball for 45 to 60 minutes.  Washing and waxing a car for 45 to 60 minutes.  Playing touch football for 45 minutes.  Walking 1  miles in 35 minutes.  Pushing a stroller 1  miles in 30 minutes.  Playing basketball for 30 minutes.  Raking leaves for 30 minutes.  Bicycling 5 miles in 30 minutes.  Walking 2 miles in 30 minutes.  Dancing for 30 minutes.  Shoveling snow for 15 minutes.  Swimming laps for 20 minutes.  Walking up stairs for 15 minutes.  Bicycling 4 miles in 15 minutes.  Gardening for 30 to 45  minutes.  Jumping rope for 15 minutes.  Washing windows or floors for 45 to 60 minutes. Document Released: 08/01/2010 Document Revised: 09/21/2011 Document Reviewed: 08/01/2010 [facility] Patient Information 2014 [place], [place].

## 2013-05-24 NOTE — Progress Notes (Signed)
Subjective:     Patient ID: [name], male   DOB: [birth year], 62 y.o.   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 He is coming in today for a followup of his diabetes and other chronic medical problems. He was released from the wound center due to his wound healing. He does not have any new wounds on either foot. He states his sugars have been high at home although he states he is taking his insulin regimen. He is on fairly high-dose insulin. He is not having any complaints at today's visit. He has stopped going to the gym after his ulcer and has not returned yet. He does know that some of his sugars are from the stuff he is eating although he finds it hard to stop.   Review of Systems  Constitutional: Positive for activity change. Negative for fever, chills, diaphoresis, appetite change and fatigue.       He is exercising less due to foot wound back in summer.  HENT: Negative.   Eyes: Negative.   Respiratory: Negative for cough, chest tightness, shortness of breath and wheezing.   Cardiovascular: Negative for chest pain, palpitations and leg swelling.  Gastrointestinal: Positive for nausea. Negative for vomiting, abdominal pain, diarrhea and constipation.       Patient gets occasional nausea for which he has phenergan.  Musculoskeletal: Negative for arthralgias, back pain, gait problem, joint swelling, myalgias, neck pain and neck stiffness.  Skin: Negative for color change, pallor, rash and wound.  Neurological: Positive for dizziness. Negative for tremors, seizures, syncope, facial asymmetry, speech difficulty, weakness, light-headedness, numbness and headaches.       Some occasional dizziness with position change and standing which clears after 1-2 seconds.       Objective:   Physical Exam  Constitutional: He is oriented to person, place, and time. He appears well-developed and well-nourished. No distress.  Morbidly obese  HENT:  Head: Normocephalic and atraumatic.  Eyes: EOM are normal. Pupils are equal, round, and reactive to light.  Neck: Normal range of motion. Neck supple. No JVD present. No tracheal deviation present. No thyromegaly present.  Cardiovascular: Normal rate, regular rhythm and normal heart sounds.   No murmur heard. Pulmonary/Chest: Effort normal and breath sounds normal. No respiratory distress. He has no wheezes. He has no rales. He exhibits no tenderness.  Abdominal: Soft. Bowel sounds are normal. He exhibits no distension. There is no tenderness. There is no rebound.  Obese  Musculoskeletal: Normal range of motion. He exhibits no edema and no tenderness.  Lymphadenopathy:    He has no cervical adenopathy.  Neurological: He is alert and oriented to person, place, and time.  Skin: Skin is warm and dry. He is not diaphoretic.       Assessment/Plan:   1. Please see problem-oriented charting.  2 Disposition-patient be seen back in 2-3 months with his PCP. He was given flu shot at today's visit. We obtained a urine microalbumin to creatinine ratio at today's visit. He'll return to see the diabetic educator for diet plan and insulin regimen adjustment. HgA1c worse and advised strongly to lose weight, start exercising again.

## 2013-05-24 NOTE — Assessment & Plan Note (Addendum)
Lab Results  Component Value Date   HGBA1C 9.0 05/24/2013   HGBA1C 8.6 12/28/2012   HGBA1C 7.6 09/07/2012     Assessment: Diabetes control: poor control (HgbA1C >9%) Progress toward A1C goal:  deteriorated Comments: Patient has multiple complications of diabetes including eye problems, foot wounds/amputation, kidney impairment, neurological manifestations.   Plan: Medications:  continue current medications, insulin lantus 160 units q am, insulin novolog 60 units (+10 if sugar >150 and +20 if sugar >300) Home glucose monitoring: Frequency: 4 times a day Timing: before breakfast;before meals Instruction/counseling given: reminded to get eye exam, reminded to bring blood glucose meter & log to each visit, discussed foot care, discussed the need for weight loss and discussed diet Educational resources provided: brochure;handout Self management tools provided: copy of home glucose meter download Other plans: He will see Lupita Leash Plyler ASAP for dietary planning and reinforce need to lose weight and exercise. Checked HgA1c at today's visit, checked microalbumin to creatinine ratio at today's visit.

## 2013-05-24 NOTE — Assessment & Plan Note (Signed)
Spent greater than 15 minutes counseling patient about his eating habits and exercise plan to help with this weight gain. Did show the patient the graph of his weights for the last 5 years. He has gone from 300 pounds to 400 pounds in that time. Did advise him to pick a reasonable goal of losing 10 pounds which I think will have a real impact on his sugars at home although will just be the tip of the iceberg in the overall scheme of his weight loss that is needed. He is morbidly obese. He seems motivated to change I just hope that the change does not come too late (with serious complication such as amputation or eyesight loss from his diabetes).

## 2013-05-25 LAB — MICROALBUMIN / CREATININE URINE RATIO: Microalb Creat Ratio: 27.2 mg/g (ref 0.0–30.0)

## 2013-05-30 ENCOUNTER — Telehealth: Payer: Self-pay | Admitting: *Deleted

## 2013-05-30 NOTE — Progress Notes (Signed)
Case discussed with Dr. Kollar soon after the resident saw the patient.  We reviewed the resident's history and exam and pertinent patient test results.  I agree with the assessment, diagnosis, and plan of care documented in the resident's note. 

## 2013-05-30 NOTE — Telephone Encounter (Signed)
Pt went to [facility] today and had 6 teeth pulled.   He as given 800 mg IBU, percocet and antibiotics post procedure.  He is calling to see if it's okay to take the above meds with the meds he is on.   He is concerned with the IBU.  He is in a lot of pain at present.  Pt # [id number]

## 2013-05-30 NOTE — Telephone Encounter (Signed)
Pt informed and voices understanding 

## 2013-05-30 NOTE — Telephone Encounter (Addendum)
Given his elevated creatinine, and GERD, Ibuprofen is best avoided. He can take his Norco/Vicodin and see if it helps, along with omeprazole daily for GERD.

## 2013-06-02 ENCOUNTER — Encounter (INDEPENDENT_AMBULATORY_CARE_PROVIDER_SITE_OTHER): Payer: PRIVATE HEALTH INSURANCE | Admitting: Ophthalmology

## 2013-06-04 ENCOUNTER — Other Ambulatory Visit: Payer: Self-pay | Admitting: Internal Medicine

## 2013-06-05 ENCOUNTER — Other Ambulatory Visit: Payer: Self-pay | Admitting: *Deleted

## 2013-06-05 NOTE — Telephone Encounter (Signed)
If you are ok, please print 3 months, thanks

## 2013-06-06 MED ORDER — HYDROCODONE-ACETAMINOPHEN 5-325 MG PO TABS: ORAL_TABLET | ORAL | Status: DC

## 2013-06-06 NOTE — Telephone Encounter (Signed)
Called pt.

## 2013-06-06 NOTE — Telephone Encounter (Signed)
I printed and signed 3 prescriptions for refill of hydrocodone-acetaminophen 5-325 mg take 1 and 1/2 tablets every 6 hours as needed for pain, #180 as follows:   A prescription to be provided to patient and filled no earlier than 06/10/2013.  A prescription to be filled no earlier than 07/10/2013, to be held on file in the clinic and provided to patient in one month if still appropriate.  A prescription to be filled no earlier than 08/09/2013, to be held on file in the clinic and provided to patient in two months if still appropriate.   Prescriptions were given to nurse.

## 2013-06-12 ENCOUNTER — Encounter (INDEPENDENT_AMBULATORY_CARE_PROVIDER_SITE_OTHER): Payer: PRIVATE HEALTH INSURANCE | Admitting: Ophthalmology

## 2013-06-12 DIAGNOSIS — H43819 Vitreous degeneration, unspecified eye: Secondary | ICD-10-CM

## 2013-06-12 DIAGNOSIS — E1139 Type 2 diabetes mellitus with other diabetic ophthalmic complication: Secondary | ICD-10-CM

## 2013-06-12 DIAGNOSIS — H35039 Hypertensive retinopathy, unspecified eye: Secondary | ICD-10-CM

## 2013-06-12 DIAGNOSIS — E11359 Type 2 diabetes mellitus with proliferative diabetic retinopathy without macular edema: Secondary | ICD-10-CM

## 2013-06-12 DIAGNOSIS — E11319 Type 2 diabetes mellitus with unspecified diabetic retinopathy without macular edema: Secondary | ICD-10-CM

## 2013-06-12 DIAGNOSIS — E11311 Type 2 diabetes mellitus with unspecified diabetic retinopathy with macular edema: Secondary | ICD-10-CM

## 2013-06-12 DIAGNOSIS — I10 Essential (primary) hypertension: Secondary | ICD-10-CM

## 2013-06-20 ENCOUNTER — Other Ambulatory Visit: Payer: Self-pay | Admitting: Internal Medicine

## 2013-06-21 ENCOUNTER — Ambulatory Visit (INDEPENDENT_AMBULATORY_CARE_PROVIDER_SITE_OTHER): Payer: PRIVATE HEALTH INSURANCE | Admitting: Dietician

## 2013-06-21 VITALS — Wt >= 6400 oz

## 2013-06-21 NOTE — Progress Notes (Signed)
Medical Nutrition Therapy:  Appt start time: 0900 end time:  1000.  Assessment:  Primary concerns today: Weight management, Blood sugar control and Meal planning.  Patient is here for help with weight loss to assist with blood sugar control. He no longer eats fried food or out very much. He drinks 1% milk, v-8 juice water and sugar free drinks. He does his own cooking and food shopping. Son is a Investment banker, operational and wanting to support him by helping him cook healthier meals. Teeth removed, awaiting dentures. Usual eating pattern includes 2-3 meals and 1-3 snacks per day. Estimated calorie needs for weight loss: 2700-3500 calories/day   Estimated TDD insulin: 180-220 units a day, current insulin intake is ~335 units a day CBG today after 152 units lantus, 80 units novolog and no breakfast was 228, patient reports CBG was > 300 this am.  24-hr recall:  (Up at  8AM, he reports that he sleeps well now with C-PAP) B ( 8-9 AM)- Banana and 1% milk (skipped yesterday and today because he is out of the food he usually eats)  L ( PM)- baked chicken wings or pig feet, bowl of string beans, potatoes and carrots    Snk ( PM)-grapes, snack food like cheese popcorn   D ( 5-6 PM)- baked chicken or fish and brown rice,  Sweet potato pie Snk ( 11:30 PM)-  3 vanilla wafers and wylers sugar free drink when he awoke from sleep Usual physical activity includes ADLs - he is thinking of going to the [facility] to exercise now that foot is healed . Progress Towards Goal(s):  In progress.   Nutritional Diagnosis:  [place]-3.3 Overweight/obesity As related to increased hunger, high insulin intake and decreased activity.  As evidenced by his  BMI of ~ 50. .    Intervention:  Nutrition education about importance of support for sustained behavior change, health coaching for behavior change and healthy meal planning.  Nutrition education about hypoglcyemia and improtance of matching insulin with food for weight/hunger control and to prevent  hypoglcyemia  Monitoring/Evaluation:  Dietary intake, exercise, meter and blood sugars, and body weight in 1 week(s).

## 2013-06-21 NOTE — Patient Instructions (Signed)
You  Can use SMARTIES or SWEET TARTS  to treat a low blood sugar.   Try to eat heavier foods like fruits and vegetables for second helpings and snacks  Healthy snacks: hard boiled eggs, tuna salad on crackers, low salt saltines or low sodium truscuits, Carrots, string beans, greens, stewed cabbage, LOW SODIUM VEGETABLE Juice cocktail (store brand V-8)  Healthy desserts: greek yogurt, yams,  Applesauce  Healthy protein, fish eggs, chicken, [place], tofu- ,   Drink 2 bottles of water with each meal- 1 before the meal and 1 with your meal.   Suggested meals:  BBQ chicken, mashed or baked potatoes with skin on,  make a tofu stir fry and put over brown rice OKra, tomatoes and beans over brown rice  Chili made with low fat ground [place] or beef over brown rice or baked potato

## 2013-06-28 ENCOUNTER — Other Ambulatory Visit: Payer: Self-pay | Admitting: Internal Medicine

## 2013-06-29 ENCOUNTER — Other Ambulatory Visit: Payer: Self-pay | Admitting: *Deleted

## 2013-06-29 ENCOUNTER — Encounter: Payer: Self-pay | Admitting: Internal Medicine

## 2013-06-29 ENCOUNTER — Ambulatory Visit (INDEPENDENT_AMBULATORY_CARE_PROVIDER_SITE_OTHER): Payer: PRIVATE HEALTH INSURANCE | Admitting: Dietician

## 2013-06-29 ENCOUNTER — Ambulatory Visit (INDEPENDENT_AMBULATORY_CARE_PROVIDER_SITE_OTHER): Payer: PRIVATE HEALTH INSURANCE | Admitting: Internal Medicine

## 2013-06-29 ENCOUNTER — Telehealth: Payer: Self-pay | Admitting: Dietician

## 2013-06-29 VITALS — BP 123/73 | HR 92 | Temp 98.2°F | Wt 395.0 lb

## 2013-06-29 DIAGNOSIS — R42 Dizziness and giddiness: Secondary | ICD-10-CM

## 2013-06-29 DIAGNOSIS — Z9181 History of falling: Secondary | ICD-10-CM

## 2013-06-29 DIAGNOSIS — R296 Repeated falls: Secondary | ICD-10-CM

## 2013-06-29 LAB — VITAMIN B12: Vitamin B-12: 618 pg/mL (ref 211–911)

## 2013-06-29 NOTE — Telephone Encounter (Signed)
Reques promethazine (Phenergan) refill already responded to (see separate note).

## 2013-06-29 NOTE — Progress Notes (Signed)
Medical Nutrition Therapy:  Appt start time: 0945 end time:  1000.  Assessment:  Primary concerns today: Weight management, Blood sugar control and Meal planning.  Patient says he did just about everything we discussed except that he intends to buy tofu this week. He is tolerating it well. His weight is decrased 6.7# in the past week. He forgot meter and reports no signs or symptoms of low blood sugar. His son saw the meal ideas and liked them. To get dentures next month Patient would like second meter one for home and one for travel. Has not begun exercise yet Estimated TDD insulin: 143 -215 units a day (0.8 u/kg - 1.2 u/kg), current insulin intake is ~335 units a day   Progress Towards Goal(s):  In progress.   Nutritional Diagnosis:  [place]-3.3 Overweight/obesity As related to increased hunger, high insulin intake and decreased activity.  As evidenced by his  BMI of ~ 50. .    Intervention:  Nutrition support provided for continued healthier food choices Coordination of care- request second meter and patient to follow up sooner to download meters. Suggest Low threshold for decreasing insulin doses at any sign of hypoglycemia  Monitoring/Evaluation:  Dietary intake, exercise, meter and blood sugars, and body weight in 1 week(s).

## 2013-06-29 NOTE — Progress Notes (Signed)
   Subjective:    Patient ID: [name], male    DOB: 1950/12/20, 62 y.o.   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  He presents with complaint of frequent falls in the past year.  He says he has fallen about 5-6 times this year and his last fall was about 3 weeks ago.  He says the falls occur when he gets up from seated and sometimes may be moving a bit fast.  He begins to feel lightheaded and says he "feels it coming."  He denies CP, palpitations, diaphoresis, dyspnea, visual disturbance, syncope or loss of bowel or bladder control surrounding the events.  He also denies drug use or alcohol abuse.  He walks with a cane outside of the home but does not use his cane in the home.  He was prescribed Diamox by his ophthalmologist and says he has been taking this for the past 3-4 months.  He says he initially was taking the medication QID but his ophthalmologist agreed to decrease to once daily after he complained of diarrhea.  He started taking it once daily about 1 month ago.       Review of Systems  Constitutional: Negative for diaphoresis and fatigue.  Eyes: Negative for visual disturbance.  Respiratory: Negative for shortness of breath.   Cardiovascular: Negative for chest pain, palpitations and leg swelling.  Gastrointestinal: Negative for nausea, vomiting, diarrhea, constipation and blood in stool.  Genitourinary: Negative for hematuria.  Neurological: Positive for dizziness and light-headedness. Negative for syncope, weakness and headaches.       Objective:   Physical Exam  Vitals reviewed. Constitutional: He is oriented to person, place, and time. He appears well-developed. No distress.  HENT:  Head: Normocephalic.  Mouth/Throat: Oropharynx is clear and moist. No oropharyngeal exudate.  Eyes: EOM are normal. Pupils are equal, round, and reactive to light. No scleral icterus.  Neck:  Neck supple.  Cardiovascular: Normal rate, regular rhythm and normal heart sounds.   drop in SBP from lying to sitting.  increase in SBP from sitting to standing.  Pulmonary/Chest: Effort normal and breath sounds normal. No respiratory distress. He has no wheezes.  Abdominal: Soft. Bowel sounds are normal. He exhibits no distension. There is no tenderness.  Musculoskeletal: Normal range of motion. He exhibits no edema and no tenderness.  Neurological: He is alert and oriented to person, place, and time. No cranial nerve deficit. He exhibits normal muscle tone. Coordination normal.  No gait abnormality  Skin: Skin is warm. He is not diaphoretic.  Psychiatric: He has a normal mood and affect. His behavior is normal.          Assessment & Plan:  Please see problem based assessment and plan.

## 2013-06-29 NOTE — Telephone Encounter (Signed)
Patient agrees to bring meter in tomorrow about 10"30 am for download and review. He also would a meter prescription sen to his pharmacy. He says he has an accu chek nano and his medication list reads accu chek Aviva, therefor will wait on new prescription until we see which one he has.

## 2013-06-29 NOTE — Patient Instructions (Signed)
1. Please see your eye doctor, Dr. Dione Booze as soon as possible to discuss possibly stopping your Diamox for a short time to see if your lightheadedness improves while off of this medication.  Use your cane in the home to assist with your balance and help prevent falling.     2. Please take all medications as prescribed.    3. If you have worsening of your symptoms or new symptoms arise, please call the clinic ([phone]), or go to the ER immediately if symptoms are severe.   Fall Prevention and Home Safety Falls cause injuries and can affect all age groups. It is possible to prevent falls.  HOW TO PREVENT FALLS  Wear shoes with rubber soles that do not have an opening for your toes.  Keep the inside and outside of your house well lit.  Use night lights throughout your home.  Remove clutter from floors.  Clean up floor spills.  Remove throw rugs or fasten them to the floor with carpet tape.  Do not place electrical cords across pathways.  Put grab bars by your tub, shower, and toilet. Do not use towel bars as grab bars.  Put handrails on both sides of the stairway. Fix loose handrails.  Do not climb on stools or stepladders, if possible.  Do not wax your floors.  Repair uneven or unsafe sidewalks, walkways, or stairs.  Keep items you use a lot within reach.  Be aware of pets.  Keep emergency numbers next to the telephone.  Put smoke detectors in your home and near bedrooms. Ask your doctor what other things you can do to prevent falls. Document Released: 04/25/2009 Document Revised: 12/29/2011 Document Reviewed: 09/29/2011 [facility] Patient Information 2014 [place], [place].

## 2013-06-30 ENCOUNTER — Encounter: Payer: Self-pay | Admitting: Dietician

## 2013-06-30 ENCOUNTER — Other Ambulatory Visit: Payer: Self-pay | Admitting: Dietician

## 2013-06-30 DIAGNOSIS — R296 Repeated falls: Secondary | ICD-10-CM | POA: Insufficient documentation

## 2013-06-30 LAB — VITAMIN D 25 HYDROXY (VIT D DEFICIENCY, FRACTURES): Vit D, 25-Hydroxy: 13 ng/mL — ABNORMAL LOW (ref 30–89)

## 2013-06-30 MED ORDER — INSULIN ASPART 100 UNIT/ML ~~LOC~~ SOLN: SUBCUTANEOUS | Status: DC

## 2013-06-30 MED ORDER — INSULIN GLARGINE 100 UNIT/ML ~~LOC~~ SOLN: 55.0000 [IU] | Freq: Two times a day (BID) | SUBCUTANEOUS | Status: DC

## 2013-06-30 MED ORDER — ACCU-CHEK FASTCLIX LANCETS MISC: Status: DC

## 2013-06-30 MED ORDER — ACCU-CHEK AVIVA PLUS W/DEVICE KIT: 1.0000 | PACK | Freq: Three times a day (TID) | Status: DC

## 2013-06-30 MED ORDER — INSULIN PEN NEEDLE 31G X 5 MM MISC: Status: DC

## 2013-06-30 NOTE — Progress Notes (Signed)
Case discussed with Norm Parcel.

## 2013-06-30 NOTE — Patient Instructions (Addendum)
Please check your blood sugars before all meals and bedtime.  Your new Insulin schedule:  Breakfast: Lantus 55 units             Novolog- 35 units with breakfast( skip if you skip breakfast) and add the    10 units for each 100 > 100mg /dl)  Lunch: Novolog- 35 units with breakfast( skip if you skip lunch) and add the 10   units for each 100 > 100mg /dl)   Dinner: Lantus 55 units              Novolog- 35 units with breakfast( skip if you skip dinner) and add the 10    units for each 100 > 100mg /dl)  Please call us next week if your blood sugars are higher than usual. Otherwise, I'll see at our regularly schedule appoitnment

## 2013-06-30 NOTE — Progress Notes (Unsigned)
Patient ID: [name], male   DOB: 1950/10/30, 62 y.o.   MRN: [id number] Medical Nutrition Therapy:  Appt start time: 1000 end time:  1030. Dr. Desma Maxim present during this visit and Dr. Waymon Amato insulin adjustment  Assessment:  Primary concerns today: Weight management and Blood sugar control.  Weight is 392.8#, pt is trying to eat healthier, not skip breakfast. He brought meter in to be downloaded, he denies dizziness or other symptoms of low blood sugar, average blood sugar 229, pattern is high in am 250-350 range and dips down during day to 75-250 range, then back up at bedtime.  He currently takes 152 units of lantus in 2 injections with breakfast. 80-60-60 units Novolog with meals for a total daily dose of 352 units /day. Calculated TDD using 1.2 u/kg for insulin existence is ~ 214 units/day ( ~ 107 basal and 107 bolus)  Usual eating pattern includes 3 meals and 1 snack per day. Usual physical activity includes rakes leaves, walks to and from bus, plans to begin working out in January.  24-hr recall: (Up at  8 AM) B (8-830 AM)- 24 oz 1% milk and banana ( 5 carbs) (takes 80 units Novolog)  L ( 1130-12 PM)-  Leftovers or yesterday had Cook out FF and big hamburger ( ~ 5-6 carb servings (he took 60 units Novolog)  D ( 460-5PM)- ~ 6 oz liver, and 2 cups brown rice( 6 servings carb and took 60 units last night which is his usual dose  Snk ( PM)- 1 cup puffin corn ( 1-2 carbs  Bedtime 8 PM- drinks diet soda and sf punch over night when he awakens  Progress Towards Goal(s):  In progress.   Nutritional Diagnosis:  [place]-2.4 Predicted food-medication interaction As related to insulin and food matching each other.  As evidenced by his blood sugar pattern.    Intervention:  Nutrition education about carb consistency and insulin dosing and self monitoring to prevent hypoglycemia.  Monitoring/Evaluation:  Dietary intake, exercise, meter/blood sugars, and body weight in 2 week(s).

## 2013-06-30 NOTE — Assessment & Plan Note (Addendum)
Assessment:  He denies CP, palpitations, visual disturbances during falls.  He is orthostatic from lying to sitting in office.  He is able to ambulate in office unassisted.  His falls may be due to orthostatic hypotension as he is on lasix and enalapril and Diamox was added 4 months ago for glaucoma.  His Diamox was decreased to once daily 1 month ago and he says he has not fallen in 3 weeks.  Plan:  Patient agrees to see Dr. Dione Booze regarding stopping Diamox.  Will defer to him.  Patient instructed to use a cane at home for ambulation to help prevent falls.  Will check vitamin B12 and vitamin D levels.

## 2013-06-30 NOTE — Telephone Encounter (Signed)
Needs additional diabetes supplies to comply with new orders.

## 2013-07-02 ENCOUNTER — Other Ambulatory Visit: Payer: Self-pay | Admitting: Internal Medicine

## 2013-07-04 ENCOUNTER — Telehealth: Payer: Self-pay | Admitting: *Deleted

## 2013-07-04 NOTE — Telephone Encounter (Signed)
Pt called - the food panty gave him grapefruit. Had noted signs on medicine bottles warning no grapefruit. Advise not to have grapefruit either fruit or juice - not worth taking a risk due to the fact changing dosage of med mixed with grapefruit. Stanton Kidney Baldwin Racicot RN 07/04/13 3PM

## 2013-07-19 ENCOUNTER — Other Ambulatory Visit: Payer: Self-pay | Admitting: Internal Medicine

## 2013-07-19 ENCOUNTER — Ambulatory Visit (INDEPENDENT_AMBULATORY_CARE_PROVIDER_SITE_OTHER): Payer: PRIVATE HEALTH INSURANCE | Admitting: Dietician

## 2013-07-19 ENCOUNTER — Telehealth: Payer: Self-pay | Admitting: Dietician

## 2013-07-19 VITALS — Wt 398.5 lb

## 2013-07-19 DIAGNOSIS — E1165 Type 2 diabetes mellitus with hyperglycemia: Principal | ICD-10-CM

## 2013-07-19 DIAGNOSIS — IMO0001 Reserved for inherently not codable concepts without codable children: Secondary | ICD-10-CM

## 2013-07-19 MED ORDER — INSULIN ASPART 100 UNIT/ML ~~LOC~~ SOLN: SUBCUTANEOUS | Status: DC

## 2013-07-19 MED ORDER — INSULIN GLARGINE 100 UNIT/ML ~~LOC~~ SOLN: 70.0000 [IU] | Freq: Two times a day (BID) | SUBCUTANEOUS | Status: DC

## 2013-07-19 NOTE — Progress Notes (Signed)
Medical Nutrition Therapy:  Appt start time: 0905 end time:  0945.  Assessment:  Primary concerns today: Weight management, Blood sugar control and Meal planning.  Patient says he is eating as healthy and smaller portions as he can and his only questions are if he can eat grapefruit and drink grapefruit or other juices(he is getting from food pantries). He is still thinking about starting exercise. He checks his feet every day. His weight is increased 6.3# in the past 2 week(stable).  His meter was downloaded: the average for the past 2 weeks is 225, range is 92 (he dnies symptoms) to     it appears to be improved on 55 units lantus and 55-40-55 Novolog per day. He reports no signs or symptoms of low blood sugar, but is still having dizziness today and other days off and on that he says is not related to blood sugars. He tried calling Dr. Zenia Resides office and they did not return his call so he stopped the Diamox.  Estimated TDD insulin: 143 -215 units a day (0.8 u/kg - 1.2 u/kg), current insulin intake is ~260 units a day   Progress Towards Goal(s):  No progress.   Nutritional Diagnosis:  [place]-3.3 Overweight/obesity As related to increased hunger, high insulin intake and decreased activity.  As evidenced by his  BMI of ~ 50. .    Intervention:  Nutrition support provided for continued healthier food choices Coordination of care- request increased in basal insulin by 30 units (15 units BID to 70 units twice daily) and decrease in bolus insulin by 15-20 units( 50-40-50 on non exercise days and 40-30-40 on exercise days) . Suggest Low threshold for decreasing insulin doses at any sign of hypoglycemia  Monitoring/Evaluation:  Dietary intake, exercise, meter and blood sugars, and body weight in 4 week(s).

## 2013-07-19 NOTE — Patient Instructions (Signed)
Your blood sugar shows good improvement!   Adjust insulin doses if approved by Dr. Marinda Elk.   Suggest increased checking of blood sugar and lower doses of Novolog on exercise days.   Please make a follow up in 2 weeks.

## 2013-07-19 NOTE — Telephone Encounter (Signed)
Asked patient to return call with verbalization that he understood new insulin orders

## 2013-07-19 NOTE — Progress Notes (Signed)
I revised the insulin dose based upon discussion with diabetes educator Debera Lat.  Patient will see me in followup next week.

## 2013-07-20 ENCOUNTER — Encounter: Payer: Self-pay | Admitting: Dietician

## 2013-07-23 ENCOUNTER — Other Ambulatory Visit: Payer: Self-pay | Admitting: Internal Medicine

## 2013-07-26 ENCOUNTER — Ambulatory Visit (INDEPENDENT_AMBULATORY_CARE_PROVIDER_SITE_OTHER): Payer: PRIVATE HEALTH INSURANCE | Admitting: Internal Medicine

## 2013-07-26 ENCOUNTER — Encounter: Payer: Self-pay | Admitting: Internal Medicine

## 2013-07-26 VITALS — BP 130/80 | HR 80 | Temp 98.2°F | Ht 78.0 in | Wt >= 6400 oz

## 2013-07-26 DIAGNOSIS — E1165 Type 2 diabetes mellitus with hyperglycemia: Principal | ICD-10-CM

## 2013-07-26 DIAGNOSIS — N189 Chronic kidney disease, unspecified: Secondary | ICD-10-CM

## 2013-07-26 DIAGNOSIS — E1142 Type 2 diabetes mellitus with diabetic polyneuropathy: Secondary | ICD-10-CM

## 2013-07-26 DIAGNOSIS — IMO0001 Reserved for inherently not codable concepts without codable children: Secondary | ICD-10-CM

## 2013-07-26 DIAGNOSIS — E1149 Type 2 diabetes mellitus with other diabetic neurological complication: Secondary | ICD-10-CM

## 2013-07-26 DIAGNOSIS — E782 Mixed hyperlipidemia: Secondary | ICD-10-CM

## 2013-07-26 DIAGNOSIS — I10 Essential (primary) hypertension: Secondary | ICD-10-CM

## 2013-07-26 DIAGNOSIS — G4733 Obstructive sleep apnea (adult) (pediatric): Secondary | ICD-10-CM

## 2013-07-26 LAB — CBC WITH DIFFERENTIAL/PLATELET
Basophils Absolute: 0 10*3/uL (ref 0.0–0.1)
Basophils Relative: 1 % (ref 0–1)
EOS ABS: 0.2 10*3/uL (ref 0.0–0.7)
Eosinophils Relative: 3 % (ref 0–5)
HCT: 36.1 % — ABNORMAL LOW (ref 39.0–52.0)
Hemoglobin: 11.8 g/dL — ABNORMAL LOW (ref 13.0–17.0)
Lymphocytes Relative: 34 % (ref 12–46)
Lymphs Abs: 2.2 10*3/uL (ref 0.7–4.0)
MCH: 28.8 pg (ref 26.0–34.0)
MCHC: 32.7 g/dL (ref 30.0–36.0)
MCV: 88 fL (ref 78.0–100.0)
Monocytes Absolute: 0.6 10*3/uL (ref 0.1–1.0)
Monocytes Relative: 10 % (ref 3–12)
NEUTROS PCT: 52 % (ref 43–77)
Neutro Abs: 3.4 10*3/uL (ref 1.7–7.7)
PLATELETS: 153 10*3/uL (ref 150–400)
RBC: 4.1 MIL/uL — ABNORMAL LOW (ref 4.22–5.81)
RDW: 14.5 % (ref 11.5–15.5)
WBC: 6.4 10*3/uL (ref 4.0–10.5)

## 2013-07-26 LAB — COMPLETE METABOLIC PANEL WITH GFR
ALK PHOS: 135 U/L — AB (ref 39–117)
ALT: 32 U/L (ref 0–53)
AST: 30 U/L (ref 0–37)
Albumin: 4 g/dL (ref 3.5–5.2)
BILIRUBIN TOTAL: 0.4 mg/dL (ref 0.3–1.2)
BUN: 20 mg/dL (ref 6–23)
CO2: 25 mEq/L (ref 19–32)
CREATININE: 1.42 mg/dL — AB (ref 0.50–1.35)
Calcium: 9.1 mg/dL (ref 8.4–10.5)
Chloride: 101 mEq/L (ref 96–112)
GFR, Est African American: 60 mL/min
GFR, Est Non African American: 52 mL/min — ABNORMAL LOW
Glucose, Bld: 218 mg/dL — ABNORMAL HIGH (ref 70–99)
Potassium: 4.7 mEq/L (ref 3.5–5.3)
Sodium: 136 mEq/L (ref 135–145)
Total Protein: 6.7 g/dL (ref 6.0–8.3)

## 2013-07-26 LAB — LIPID PANEL
CHOL/HDL RATIO: 5.1 ratio
CHOLESTEROL: 122 mg/dL (ref 0–200)
HDL: 24 mg/dL — AB (ref >39)
LDL Cholesterol: 37 mg/dL (ref 0–99)
TRIGLYCERIDES: 303 mg/dL — AB (ref <150)
VLDL: 61 mg/dL — AB (ref 0–40)

## 2013-07-26 LAB — GLUCOSE, CAPILLARY: GLUCOSE-CAPILLARY: 255 mg/dL — AB (ref 70–99)

## 2013-07-26 MED ORDER — AMITRIPTYLINE HCL 25 MG PO TABS: 25.0000 mg | ORAL_TABLET | Freq: Every day | ORAL | Status: DC

## 2013-07-26 MED ORDER — ZOSTER VACCINE LIVE 19400 UNT/0.65ML ~~LOC~~ SOLR
0.6500 mL | Freq: Once | SUBCUTANEOUS | Status: DC
Start: 2013-07-26 — End: 2014-03-28

## 2013-07-26 NOTE — Assessment & Plan Note (Signed)
Lab Results  Component Value Date   CREATININE 1.51* 12/28/2012   CREATININE 1.60* 12/08/2012   CREATININE 1.75* 11/02/2012   CREATININE 1.43* 09/07/2012   CREATININE 1.42* 08/11/2012     Assessment: Patient has stable chronic renal insufficiency related to diabetes and hypertension.  Plan: Check a metabolic panel today.

## 2013-07-26 NOTE — Assessment & Plan Note (Signed)
Assessment: Patient reports that he is compliant with his CPAP, and is not having daytime somnolence or other symptoms.  Plan: Continue CPAP.

## 2013-07-26 NOTE — Progress Notes (Signed)
   Subjective:    Patient ID: [name], male    DOB: March 18, 1951, 62 y.o.   MRN: [id number]  HPI Patient returns for followup of his diabetes mellitus and other chronic medical problems.  He has no acute complaints today, and reports that he has been doing well.  On review of systems, he does report some orthostatic dizziness which occurs when he first stands up; this has been happening for over a month.  He reports improvement in his blood sugars since the most recent increase in his insulin regimen.  He has calluses on his feet but has no open wound and reports that he has been released from the wound clinic since his left foot wound has healed.  He reports that he is compliant with his medications, including his CPAP.     Review of Systems  Respiratory: Positive for shortness of breath (Stable exertional dyspnea). Negative for cough, chest tightness and wheezing.   Cardiovascular: Negative for chest pain and leg swelling.  Gastrointestinal: Negative for nausea, vomiting and abdominal pain.  Neurological: Positive for dizziness (Occasional dizziness when he stands up).       Objective:   Physical Exam  Constitutional: No distress.  Cardiovascular: Normal rate and regular rhythm.  Exam reveals no gallop and no friction rub.   No murmur heard. Pulmonary/Chest: Effort normal and breath sounds normal. No respiratory distress. He has no wheezes. He has no rales.  Abdominal: Soft. Bowel sounds are normal. He exhibits no distension. There is no tenderness. There is no rebound.  Skin:         Assessment & Plan:

## 2013-07-26 NOTE — Assessment & Plan Note (Signed)
BP Readings from Last 3 Encounters:  07/26/13 130/80  06/29/13 123/73  05/24/13 153/89    Lab Results  Component Value Date   NA 136 12/28/2012   K 4.7 12/28/2012   CREATININE 1.51* 12/28/2012    Assessment: Blood pressure control: controlled Progress toward BP goal:  at goal Comments: Doing well on enalapril 20 mg daily and furosemide 40 mg each morning and 20 mg each evening.  Plan: Medications:  continue current medications

## 2013-07-26 NOTE — Patient Instructions (Addendum)
General Instructions: Change amitriptyline to a dose of 25 mg one tablet daily at bedtime.   Progress Toward Treatment Goals:  Treatment Goal 07/26/2013  Hemoglobin A1C improved  Blood pressure at goal  Prevent falls improved    Self Care Goals & Plans:  Self Care Goal 06/29/2013  Manage my medications -  Monitor my health -  Eat healthy foods -  Be physically active -  Prevent falls use home fall prevention checklist to improve safety; have my vision checked  Meeting treatment goals -    Home Blood Glucose Monitoring 07/26/2013  Check my blood sugar 4 times a day  When to check my blood sugar before meals; at bedtime     Care Management & Community Referrals:  Referral 07/26/2013  Referrals made for care management support -  Referrals made to community resources none

## 2013-07-26 NOTE — Assessment & Plan Note (Signed)
Lipids:    Component Value Date/Time   CHOL 147 09/07/2012 1011   TRIG 247* 09/07/2012 1011   HDL 32* 09/07/2012 1011   LDLCALC 66 09/07/2012 1011   VLDL 49* 09/07/2012 1011   CHOLHDL 4.6 09/07/2012 1011    Assessment: Patient is doing well on atorvastatin 20 mg daily.  Plan: Continue atorvastatin 20 mg daily; check a lipid panel at next visit.

## 2013-07-26 NOTE — Assessment & Plan Note (Signed)
Lab Results  Component Value Date   HGBA1C 9.0 05/24/2013   HGBA1C 8.6 12/28/2012   HGBA1C 7.6 09/07/2012     Assessment: Diabetes control: fair control Progress toward A1C goal:  improved Comments: Blood sugars are better controlled and show less variability on his current regimen.  Plan: Medications:  continue current medications Home glucose monitoring: Frequency: 4 times a day Timing: before meals;at bedtime Instruction/counseling given: discussed foot care

## 2013-07-26 NOTE — Assessment & Plan Note (Signed)
Assessment: Patient has diabetic peripheral neuropathy with chronic pain.  His symptoms are currently controlled on amitriptyline 75 mg daily and hydrocodone/acetaminophen 5-325 mg 1 and 1/2 tablets every 6 hours as needed for pain.  However, he reports occasional orthostatic dizziness and I am concerned that the amitriptyline may be contributing.   Plan: Decrease amitriptyline to a dose of 25 mg at bedtime; continue hydrocodone/acetaminophen at current dose.

## 2013-08-09 ENCOUNTER — Other Ambulatory Visit: Payer: Self-pay | Admitting: Dietician

## 2013-08-09 ENCOUNTER — Encounter: Payer: Self-pay | Admitting: Dietician

## 2013-08-09 ENCOUNTER — Ambulatory Visit (INDEPENDENT_AMBULATORY_CARE_PROVIDER_SITE_OTHER): Payer: PRIVATE HEALTH INSURANCE | Admitting: Dietician

## 2013-08-09 VITALS — Wt >= 6400 oz

## 2013-08-09 DIAGNOSIS — E1165 Type 2 diabetes mellitus with hyperglycemia: Principal | ICD-10-CM

## 2013-08-09 DIAGNOSIS — IMO0001 Reserved for inherently not codable concepts without codable children: Secondary | ICD-10-CM

## 2013-08-09 NOTE — Progress Notes (Signed)
Medical Nutrition Therapy:  Appt start time: 0920 end time:  0950.  Assessment:  Primary concerns today: Weight management, Blood sugar control and Meal planning.  Patient has not started exercise. Working on eating and buying healthier food choices. Reports that he drinks juice that he was given from church overnight and plans to work on decreasing his juice consumption to lower his fasting blood sugars.  Checked blood sugar 3.7 times a day, average for past 30 days is 213 with range of 92-415. His weight is stable at a BMI of 45. Pattern is stable between 200-250, with fasting blood sugar the highest.  Insulin: 140 units lantus/day & 160-170 units Novolog/day for TDD of 307 units or ~1.5 u/kg and still needs better blood sugars.  He reports no signs or symptoms of low blood sugar.   Progress Towards Goal(s):  Some progress.   Nutritional Diagnosis:  [place]-3.3 Overweight/obesity As related to increased hunger, high insulin intake and decreased activity.  As evidenced by his  BMI of ~ 50. .    Intervention:  Nutrition support provided for continued healthier food choices. Reminded of exercise safety when on insulin, decreasing doses, carrying meter and carbs. Reviewed meter download with patient and meter averages and taught patient how to do this himself at home to monitor his progress. Coordination of care-  Suggest Low threshold for decreasing insulin doses at any sign of hypoglycemia  Monitoring/Evaluation:  Dietary intake, exercise, meter and blood sugars, and body weight in 2 week(s).

## 2013-08-09 NOTE — Progress Notes (Signed)
Order signed as requested

## 2013-08-09 NOTE — Progress Notes (Signed)
Please sign referral for MNT and DSME for 2015.

## 2013-08-09 NOTE — Patient Instructions (Signed)
Great job checking your blood sugar and taking your insulin and eating healthier.   To try to lower your breakfast reading you want to try to drink no calorie drinks at night when you are thirsty instead of juice.   Buy light diet sodas like: Diet sierra mist, Diet 7-up, Diet ginger ale, Diet [facility], Diet [place]  Use Light Mayonnaise  Please make a follow up in 2 weeks.

## 2013-08-11 ENCOUNTER — Other Ambulatory Visit: Payer: Self-pay | Admitting: Internal Medicine

## 2013-08-14 ENCOUNTER — Encounter (INDEPENDENT_AMBULATORY_CARE_PROVIDER_SITE_OTHER): Payer: PRIVATE HEALTH INSURANCE | Admitting: Ophthalmology

## 2013-08-14 MED ORDER — INSULIN ASPART 100 UNIT/ML FLEXPEN: PEN_INJECTOR | SUBCUTANEOUS | Status: DC

## 2013-08-14 NOTE — Addendum Note (Signed)
Addended by: Bertha Stakes on: 08/14/2013 11:00 AM   Modules accepted: Orders

## 2013-08-14 NOTE — Telephone Encounter (Signed)
Rx needs to be faxed to the pharmacy; cannot be called in.

## 2013-08-14 NOTE — Telephone Encounter (Signed)
Novolog rx called to [facility].

## 2013-08-14 NOTE — Telephone Encounter (Signed)
[facility] pharmacy stated they have not received insulin refill.

## 2013-08-14 NOTE — Telephone Encounter (Signed)
I reordered the insulin refill and use Fax as the method; please confirm receipt by the pharmacy.

## 2013-08-16 ENCOUNTER — Encounter (INDEPENDENT_AMBULATORY_CARE_PROVIDER_SITE_OTHER): Payer: PRIVATE HEALTH INSURANCE | Admitting: Ophthalmology

## 2013-08-16 DIAGNOSIS — E11359 Type 2 diabetes mellitus with proliferative diabetic retinopathy without macular edema: Secondary | ICD-10-CM

## 2013-08-16 DIAGNOSIS — H35039 Hypertensive retinopathy, unspecified eye: Secondary | ICD-10-CM

## 2013-08-16 DIAGNOSIS — E11311 Type 2 diabetes mellitus with unspecified diabetic retinopathy with macular edema: Secondary | ICD-10-CM

## 2013-08-16 DIAGNOSIS — E1139 Type 2 diabetes mellitus with other diabetic ophthalmic complication: Secondary | ICD-10-CM

## 2013-08-16 DIAGNOSIS — I10 Essential (primary) hypertension: Secondary | ICD-10-CM

## 2013-08-16 DIAGNOSIS — E1165 Type 2 diabetes mellitus with hyperglycemia: Secondary | ICD-10-CM

## 2013-08-16 DIAGNOSIS — E11319 Type 2 diabetes mellitus with unspecified diabetic retinopathy without macular edema: Secondary | ICD-10-CM

## 2013-08-16 DIAGNOSIS — H43819 Vitreous degeneration, unspecified eye: Secondary | ICD-10-CM

## 2013-08-17 NOTE — Progress Notes (Signed)
I saw and evaluated the patient.  I personally confirmed the key portions of the history and exam documented by Dr. Wilson and I reviewed pertinent patient test results.  The assessment, diagnosis, and plan were formulated together and I agree with the documentation in the resident's note. 

## 2013-08-22 ENCOUNTER — Encounter: Payer: Self-pay | Admitting: Internal Medicine

## 2013-08-22 ENCOUNTER — Ambulatory Visit (INDEPENDENT_AMBULATORY_CARE_PROVIDER_SITE_OTHER): Payer: PRIVATE HEALTH INSURANCE | Admitting: Dietician

## 2013-08-22 ENCOUNTER — Ambulatory Visit (INDEPENDENT_AMBULATORY_CARE_PROVIDER_SITE_OTHER): Payer: PRIVATE HEALTH INSURANCE | Admitting: Internal Medicine

## 2013-08-22 ENCOUNTER — Encounter: Payer: Self-pay | Admitting: Dietician

## 2013-08-22 VITALS — BP 149/82 | HR 91 | Temp 97.0°F | Wt >= 6400 oz

## 2013-08-22 DIAGNOSIS — E1165 Type 2 diabetes mellitus with hyperglycemia: Principal | ICD-10-CM

## 2013-08-22 DIAGNOSIS — E1149 Type 2 diabetes mellitus with other diabetic neurological complication: Secondary | ICD-10-CM

## 2013-08-22 DIAGNOSIS — I10 Essential (primary) hypertension: Secondary | ICD-10-CM

## 2013-08-22 DIAGNOSIS — IMO0001 Reserved for inherently not codable concepts without codable children: Secondary | ICD-10-CM

## 2013-08-22 DIAGNOSIS — E1142 Type 2 diabetes mellitus with diabetic polyneuropathy: Secondary | ICD-10-CM

## 2013-08-22 LAB — POCT GLYCOSYLATED HEMOGLOBIN (HGB A1C): HEMOGLOBIN A1C: 8.3

## 2013-08-22 LAB — GLUCOSE, CAPILLARY: Glucose-Capillary: 108 mg/dL — ABNORMAL HIGH (ref 70–99)

## 2013-08-22 MED ORDER — INSULIN ASPART 100 UNIT/ML FLEXPEN: PEN_INJECTOR | SUBCUTANEOUS | Status: DC

## 2013-08-22 NOTE — Assessment & Plan Note (Signed)
BP Readings from Last 3 Encounters:  08/22/13 149/82  07/26/13 130/80  06/29/13 123/73    Lab Results  Component Value Date   NA 136 07/26/2013   K 4.7 07/26/2013   CREATININE 1.42* 07/26/2013    Assessment: Blood pressure control: mildly elevated Progress toward BP goal:  unchanged Comments: Blood pressure is mildly elevated on enalapril 20 mg daily and furosemide 40 mg each morning and 20 mg each evening.  Plan: Medications:  continue current medications

## 2013-08-22 NOTE — Patient Instructions (Signed)
Change Novolog insulin to 50 units before breakfast, 35 units before lunch, and 50 units before dinner, plus 1 unit for every 10 mg/dl above 100 mg/dl; reduce each mealtime dose by 10 units on exercise days. Continue other medications as before.

## 2013-08-22 NOTE — Progress Notes (Signed)
   Subjective:    Patient ID: [name], male    DOB: April 13, 1951, 63 y.o.   MRN: [id number]  HPI Patient returns for followup of his diabetes mellitus, hypertension, and other medical problems, and for a detailed foot examination for diabetic shoes.  He has no acute complaints today.  His blood sugars since his last visit show an average value of 175, with a range from 66-387; he has had lower values in the afternoon.  He reports that he is compliant with his medications.   Review of Systems  Neurological: Positive for numbness (Numbness of feet bilaterally which is chronic).        Objective:   Physical Exam  Constitutional: No distress.  Cardiovascular: Normal rate, regular rhythm and normal heart sounds.  Exam reveals no gallop and no friction rub.   No murmur heard. Pulmonary/Chest: Effort normal and breath sounds normal. No respiratory distress. He has no wheezes. He has no rales.  Neurological:  Decreased sensation on monofilament testing bilaterally  Skin:             Assessment & Plan:

## 2013-08-22 NOTE — Assessment & Plan Note (Signed)
Assessment: Patient has diabetic peripheral neuropathy with chronic pain.  At the time of his last visit on 07/26/2013, I reduced his amitriptyline from 75 mg at bedtime to a dose of 25 mg at bedtime because of occasional orthostatic dizziness.  He reports that he is doing well on the lower dose of amitriptyline.    Plan: Continue amitriptyline 25 mg at bedtime; continue hydrocodone/acetaminophen at current dose.

## 2013-08-22 NOTE — Progress Notes (Signed)
Medical Nutrition Therapy:  Appt start time: 0900 end time:  0930. ( 15 minutes on MNT and 15 minutes on foot exam)  Assessment:  Primary concerns today: Weight management, Blood sugar control and Meal planning.  Patient exercised 2 days in past 2 weeks, looking for alternatives. He continues to make improvements on eating and buying healthier food choices. Reports that he is drinking diet clear soda and diet drinks, no juice. Weight without change. A1   C improved.  Checked blood sugar 3.7 times a day, average for past 30 days is 213 with range of 66-415. [place] Kitchen Pattern is stable between 200-250, with fasting blood sugar the highest.  Insulin: 140 units lantus/day & 160-170 units Novolog/day for TDD of 307 units or ~1.5 u/kg.  He reports signs or symptoms of low blood sugar midday with CBG of 66 after taking usual novolog with usual breakfast that day. Has 3 other CBG midday < 100 in past week. Average of meter download for past 2 weeks is decreased to 176 ( decreased from 213) Most readings in 100s for past week.   Progress Towards Goal(s):  No progress with weight, some progress with A1C.   Nutritional Diagnosis:  [place]-3.3 Overweight/obesity As related to increased hunger, high insulin intake and decreased activity.  As evidenced by his  BMI of ~ 46.8    Intervention:  Nutrition support provided for continued healthier food choices.  Reviewed meter download with patient and meter averages. Coordination of care-  Suggest decreasing insulin doses at breakfast and /or lunch by 4-5 units, also patient request referral to podiatrist. Foot exam and education done today  Monitoring/Evaluation:  Dietary intake, exercise, meter and blood sugars, and body weight in 2 week(s).

## 2013-08-22 NOTE — Assessment & Plan Note (Signed)
Lab Results  Component Value Date   HGBA1C 8.3 08/22/2013   HGBA1C 9.0 05/24/2013   HGBA1C 8.6 12/28/2012     Assessment: Diabetes control: fair control Progress toward A1C goal:  improved Comments: Hemoglobin A1c has improved; patient is currently on Lantus insulin 70 units twice a day and NovoLog Flexpen insulin 50 units before breakfast, 40 units before lunch, and 50 units before dinner, plus 1 unit for every 10 mg/dl above 100 mg/dl, with instructions to reduce each mealtime dose by 10 units on exercise days.  He has had lower sugars in the afternoon, although no symptomatic hypoglycemia.   Plan: Medications:  Decrease lunchtime NovoLog to 35 units plus correction insulin as before; otherwise continue current regimen. Home glucose monitoring: Frequency: 4 times a day Timing: before meals;at bedtime Instruction/counseling given: discussed foot care Other plans: A detailed foot exam was performed and the forms were filled out for diabetic shoes.

## 2013-08-23 ENCOUNTER — Encounter: Payer: PRIVATE HEALTH INSURANCE | Admitting: Dietician

## 2013-08-23 ENCOUNTER — Ambulatory Visit: Payer: PRIVATE HEALTH INSURANCE | Admitting: Dietician

## 2013-09-03 ENCOUNTER — Other Ambulatory Visit: Payer: Self-pay | Admitting: Internal Medicine

## 2013-09-04 ENCOUNTER — Other Ambulatory Visit: Payer: Self-pay | Admitting: *Deleted

## 2013-09-04 NOTE — Telephone Encounter (Signed)
Last refill 1/28, will be due on Friday

## 2013-09-04 NOTE — Telephone Encounter (Signed)
Since sx based, PRN med will fill but no refills

## 2013-09-06 ENCOUNTER — Ambulatory Visit: Payer: PRIVATE HEALTH INSURANCE | Admitting: Internal Medicine

## 2013-09-06 MED ORDER — HYDROCODONE-ACETAMINOPHEN 5-325 MG PO TABS: ORAL_TABLET | ORAL | Status: DC

## 2013-09-27 ENCOUNTER — Encounter (INDEPENDENT_AMBULATORY_CARE_PROVIDER_SITE_OTHER): Payer: PRIVATE HEALTH INSURANCE | Admitting: Ophthalmology

## 2013-09-27 DIAGNOSIS — E11319 Type 2 diabetes mellitus with unspecified diabetic retinopathy without macular edema: Secondary | ICD-10-CM

## 2013-09-27 DIAGNOSIS — I10 Essential (primary) hypertension: Secondary | ICD-10-CM

## 2013-09-27 DIAGNOSIS — H43819 Vitreous degeneration, unspecified eye: Secondary | ICD-10-CM

## 2013-09-27 DIAGNOSIS — H35039 Hypertensive retinopathy, unspecified eye: Secondary | ICD-10-CM

## 2013-09-27 DIAGNOSIS — E11359 Type 2 diabetes mellitus with proliferative diabetic retinopathy without macular edema: Secondary | ICD-10-CM

## 2013-09-27 DIAGNOSIS — E1165 Type 2 diabetes mellitus with hyperglycemia: Secondary | ICD-10-CM

## 2013-09-27 DIAGNOSIS — E1139 Type 2 diabetes mellitus with other diabetic ophthalmic complication: Secondary | ICD-10-CM

## 2013-09-27 DIAGNOSIS — E11311 Type 2 diabetes mellitus with unspecified diabetic retinopathy with macular edema: Secondary | ICD-10-CM

## 2013-10-03 ENCOUNTER — Other Ambulatory Visit: Payer: Self-pay | Admitting: *Deleted

## 2013-10-04 ENCOUNTER — Other Ambulatory Visit: Payer: Self-pay | Admitting: Internal Medicine

## 2013-10-05 ENCOUNTER — Telehealth: Payer: Self-pay | Admitting: Licensed Clinical Social Worker

## 2013-10-05 ENCOUNTER — Encounter: Payer: Self-pay | Admitting: Licensed Clinical Social Worker

## 2013-10-05 MED ORDER — HYDROCODONE-ACETAMINOPHEN 5-325 MG PO TABS: ORAL_TABLET | ORAL | Status: DC

## 2013-10-05 NOTE — Telephone Encounter (Signed)
Call when ready @ [phone]

## 2013-10-05 NOTE — Telephone Encounter (Signed)
Mr. Nolt has requested Part B of a SCAT application to be completed on his behalf.  CSW has attempted to contact Mr. Harcum at all numbers provided and none are working or able to leave message.  CSW has sent pt's letter with requested information in order to complete his SCAT application.  CSW will proceed with application once pt's signed portions are returned.  CSW will sign off.

## 2013-10-05 NOTE — Telephone Encounter (Signed)
Refill printed and signed - nurse to complete. 

## 2013-10-05 NOTE — Telephone Encounter (Signed)
Pt called and informed Rx is ready 

## 2013-10-17 ENCOUNTER — Telehealth: Payer: Self-pay | Admitting: Licensed Clinical Social Worker

## 2013-10-17 NOTE — Telephone Encounter (Signed)
CSW received [name] completed portion of SCAT application, including signed ROI.  CSW confirmed pt does not have a guardian, pt had son sign in guardian portion of application.  [name] stated son assists with transportation at times.  CSW notified pt, application will be forwarded to PCP and then faxed to [place] office.

## 2013-10-27 ENCOUNTER — Encounter (INDEPENDENT_AMBULATORY_CARE_PROVIDER_SITE_OTHER): Payer: PRIVATE HEALTH INSURANCE | Admitting: Ophthalmology

## 2013-10-31 ENCOUNTER — Other Ambulatory Visit: Payer: Self-pay | Admitting: *Deleted

## 2013-10-31 DIAGNOSIS — E1142 Type 2 diabetes mellitus with diabetic polyneuropathy: Secondary | ICD-10-CM

## 2013-10-31 MED ORDER — HYDROCODONE-ACETAMINOPHEN 5-325 MG PO TABS: ORAL_TABLET | ORAL | Status: DC

## 2013-10-31 NOTE — Telephone Encounter (Signed)
Refill printed and signed - nurse to complete. 

## 2013-10-31 NOTE — Telephone Encounter (Signed)
Pt informed Rx will be ready on Friday

## 2013-10-31 NOTE — Telephone Encounter (Signed)
Last filled 3/27 Call when ready @ [phone]

## 2013-11-05 ENCOUNTER — Other Ambulatory Visit: Payer: Self-pay | Admitting: Internal Medicine

## 2013-11-17 ENCOUNTER — Other Ambulatory Visit: Payer: Self-pay | Admitting: Internal Medicine

## 2013-11-17 DIAGNOSIS — E559 Vitamin D deficiency, unspecified: Secondary | ICD-10-CM | POA: Insufficient documentation

## 2013-11-17 MED ORDER — VITAMIN D3 1.25 MG (50000 UT) PO CAPS: 1.0000 | ORAL_CAPSULE | ORAL | Status: DC

## 2013-11-17 NOTE — Progress Notes (Signed)
A 25 OH vitamin D level drawn in December 2014 was recently routed to my in basket, and showed a low value of 14.  The plan is treat with cholecalciferol (vitamin D3) 50,000 units once a week for 6 weeks, and then institute a low maintenance dose of vitamin D3.

## 2013-11-22 ENCOUNTER — Telehealth: Payer: Self-pay | Admitting: *Deleted

## 2013-11-22 ENCOUNTER — Ambulatory Visit: Payer: PRIVATE HEALTH INSURANCE | Admitting: Internal Medicine

## 2013-11-22 MED ORDER — ERGOCALCIFEROL 1.25 MG (50000 UT) PO CAPS: 50000.0000 [IU] | ORAL_CAPSULE | ORAL | Status: DC

## 2013-11-22 NOTE — Telephone Encounter (Signed)
I changed the prescription to Vitamin D2 (ergocalciferol) 50,000 units once a week for 6 weeks.

## 2013-11-22 NOTE — Telephone Encounter (Signed)
Pharmacist calls and states pt's insurance will not pay for the vit d that was ordered for the pt, it will pay for vit d 2 50,000units ergocalciferol. Please advise or send new script to pharm

## 2013-12-04 ENCOUNTER — Other Ambulatory Visit: Payer: Self-pay | Admitting: Internal Medicine

## 2013-12-05 ENCOUNTER — Other Ambulatory Visit: Payer: Self-pay | Admitting: *Deleted

## 2013-12-05 DIAGNOSIS — E1142 Type 2 diabetes mellitus with diabetic polyneuropathy: Secondary | ICD-10-CM

## 2013-12-05 NOTE — Telephone Encounter (Signed)
Call pt when ready @ [phone]  Last refill 4/26

## 2013-12-06 MED ORDER — HYDROCODONE-ACETAMINOPHEN 5-325 MG PO TABS: ORAL_TABLET | ORAL | Status: DC

## 2013-12-06 NOTE — Telephone Encounter (Signed)
Pt informed Rx is ready 

## 2013-12-06 NOTE — Telephone Encounter (Signed)
Refill printed and signed - nurse to complete. 

## 2013-12-13 ENCOUNTER — Ambulatory Visit (INDEPENDENT_AMBULATORY_CARE_PROVIDER_SITE_OTHER): Payer: PRIVATE HEALTH INSURANCE | Admitting: Dietician

## 2013-12-13 ENCOUNTER — Ambulatory Visit (INDEPENDENT_AMBULATORY_CARE_PROVIDER_SITE_OTHER): Payer: PRIVATE HEALTH INSURANCE | Admitting: Internal Medicine

## 2013-12-13 ENCOUNTER — Telehealth: Payer: Self-pay | Admitting: Dietician

## 2013-12-13 VITALS — BP 131/78 | HR 77 | Temp 97.1°F | Ht 78.0 in | Wt >= 6400 oz

## 2013-12-13 DIAGNOSIS — E1142 Type 2 diabetes mellitus with diabetic polyneuropathy: Secondary | ICD-10-CM

## 2013-12-13 DIAGNOSIS — IMO0001 Reserved for inherently not codable concepts without codable children: Secondary | ICD-10-CM

## 2013-12-13 DIAGNOSIS — E1149 Type 2 diabetes mellitus with other diabetic neurological complication: Secondary | ICD-10-CM

## 2013-12-13 DIAGNOSIS — I10 Essential (primary) hypertension: Secondary | ICD-10-CM

## 2013-12-13 DIAGNOSIS — E782 Mixed hyperlipidemia: Secondary | ICD-10-CM

## 2013-12-13 DIAGNOSIS — E1165 Type 2 diabetes mellitus with hyperglycemia: Principal | ICD-10-CM

## 2013-12-13 LAB — GLUCOSE, CAPILLARY: Glucose-Capillary: 241 mg/dL — ABNORMAL HIGH (ref 70–99)

## 2013-12-13 LAB — POCT GLYCOSYLATED HEMOGLOBIN (HGB A1C): Hemoglobin A1C: 8.6

## 2013-12-13 MED ORDER — INSULIN ASPART 100 UNIT/ML FLEXPEN: PEN_INJECTOR | SUBCUTANEOUS | Status: DC

## 2013-12-13 NOTE — Assessment & Plan Note (Addendum)
Assessment: Patient has diabetic peripheral neuropathy with chronic pain.  He is doing well on amitriptyline 25 mg daily at bedtime and hydrocodone-acetaminophen 5-325 mg 1-1/2 tablets by mouth every 6 hours as needed for pain.    Plan: Continue amitriptyline 25 mg at bedtime; continue hydrocodone/acetaminophen at current dose.

## 2013-12-13 NOTE — Patient Instructions (Addendum)
General Instructions: Change Novolog insulin instructions to the following:  Inject 50 units before breakfast, 35 units before lunch, and 52 units before dinner, plus 1 unit for every 10 mg/dl above 100 mg/dl; reduce each mealtime dose by 10 units on exercise days.   Progress Toward Treatment Goals:  Treatment Goal 12/13/2013  Hemoglobin A1C deteriorated  Blood pressure at goal  Prevent falls -    Self Care Goals & Plans:  Self Care Goal 12/13/2013  Manage my medications take my medicines as prescribed; refill my medications on time  Monitor my health keep track of my blood glucose; bring my glucose meter and log to each visit  Eat healthy foods drink diet soda or water instead of juice or soda; eat foods that are low in salt; eat baked foods instead of fried foods; eat smaller portions  Be physically active -  Prevent falls -  Meeting treatment goals -    Home Blood Glucose Monitoring 12/13/2013  Check my blood sugar 4 times a day  When to check my blood sugar before meals; at bedtime     Care Management & Community Referrals:  Referral 12/13/2013  Referrals made for care management support none needed  Referrals made to community resources none

## 2013-12-13 NOTE — Progress Notes (Signed)
   Subjective:    Patient ID: [name], male    DOB: 1951-02-16, 63 y.o.   MRN: [id number]  HPI Patient returns for management of his poorly controlled diabetes mellitus, diabetic neuropathy, hypertension, and other chronic problems.  He is doing well today, without any acute complaints.  He reports that he is compliant with his medications although he may occasionally miss an insulin dose.  His glucometer record shows generally higher sugars in the evening after supper (see log), and he has had a few low values during the day which he attributes to eating less after he took his pre-meal insulin.  Although he has chronic calluses on his left foot, he denies any open wounds.  He reports significant improvement in his dizziness since we reduced his amitriptyline from 75 mg to 25 mg daily; his pain is well controlled on his current regimen of hydrocodone/acetaminophen.   Review of Systems  Gastrointestinal: Positive for nausea (Mild and chronic). Negative for vomiting and abdominal pain.  Skin: Negative for wound.  Neurological: Negative for light-headedness.       Objective:   Physical Exam  Constitutional: No distress.  Cardiovascular: Normal rate, regular rhythm and normal heart sounds.  Exam reveals no gallop and no friction rub.   No murmur heard. Pulmonary/Chest: Effort normal and breath sounds normal. No respiratory distress. He has no wheezes. He has no rales.  Abdominal: Soft. Bowel sounds are normal. He exhibits no distension. There is no tenderness. There is no rebound and no guarding.  Musculoskeletal: He exhibits no edema.  Skin:          Assessment & Plan:

## 2013-12-13 NOTE — Progress Notes (Signed)
Patient to reschedule visit

## 2013-12-13 NOTE — Assessment & Plan Note (Signed)
BP Readings from Last 3 Encounters:  12/13/13 131/78  08/22/13 149/82  07/26/13 130/80    Lab Results  Component Value Date   NA 136 07/26/2013   K 4.7 07/26/2013   CREATININE 1.42* 07/26/2013    Assessment: Blood pressure control: controlled Progress toward BP goal:  at goal Comments: Blood pressure is controlled on enalapril 20 mg daily and furosemide 40 mg each morning and 20 mg each evening  Plan: Medications:  continue current medications

## 2013-12-13 NOTE — Telephone Encounter (Signed)
Missed eye exam rescheduled with dr. Tempie Hoist for 12-18-13 at 8 AM

## 2013-12-13 NOTE — Assessment & Plan Note (Addendum)
Lab Results  Component Value Date   HGBA1C 8.6 12/13/2013   HGBA1C 8.3 08/22/2013   HGBA1C 9.0 05/24/2013     Assessment: Diabetes control: fair control Progress toward A1C goal:  deteriorated Comments: Patient's hemoglobin A1c has worsened slightly since his last visit; he feels that this may relate to his eating habits and activity.  He is currently taking Lantus insulin 70 units twice a day, and Novolog 50 units before breakfast, 35 units before lunch, and 50 units before dinner, plus 1 unit for every 10 mg/dl above 100 mg/dl, with instructions to reduce each mealtime dose by 10 units on exercise days.   Plan: Medications:  Increase suppertime NovoLog to a dose of 52 units with supper plus correction insulin as before; continue other insulin as before Home glucose monitoring: Frequency: 4 times a day Timing: before meals;at bedtime Instruction/counseling given: discussed foot care Self management tools provided: copy of home glucose meter download

## 2013-12-13 NOTE — Assessment & Plan Note (Signed)
Lipids:    Component Value Date/Time   CHOL 122 07/26/2013 1044   TRIG 303* 07/26/2013 1044   HDL 24* 07/26/2013 1044   LDLCALC 37 07/26/2013 1044   VLDL 61* 07/26/2013 1044   CHOLHDL 5.1 07/26/2013 1044    Assessment: LDL is at goal on atorvastatin 20 mg daily.  Patient has no apparent side effects of the medication  Plan: Continue atorvastatin 20 mg daily.

## 2013-12-18 ENCOUNTER — Encounter (INDEPENDENT_AMBULATORY_CARE_PROVIDER_SITE_OTHER): Payer: PRIVATE HEALTH INSURANCE | Admitting: Ophthalmology

## 2013-12-21 ENCOUNTER — Encounter (INDEPENDENT_AMBULATORY_CARE_PROVIDER_SITE_OTHER): Payer: PRIVATE HEALTH INSURANCE | Admitting: Ophthalmology

## 2013-12-21 DIAGNOSIS — H43819 Vitreous degeneration, unspecified eye: Secondary | ICD-10-CM

## 2013-12-21 DIAGNOSIS — E11311 Type 2 diabetes mellitus with unspecified diabetic retinopathy with macular edema: Secondary | ICD-10-CM

## 2013-12-21 DIAGNOSIS — I10 Essential (primary) hypertension: Secondary | ICD-10-CM

## 2013-12-21 DIAGNOSIS — H35039 Hypertensive retinopathy, unspecified eye: Secondary | ICD-10-CM

## 2013-12-21 DIAGNOSIS — E1165 Type 2 diabetes mellitus with hyperglycemia: Secondary | ICD-10-CM

## 2013-12-21 DIAGNOSIS — E11319 Type 2 diabetes mellitus with unspecified diabetic retinopathy without macular edema: Secondary | ICD-10-CM

## 2013-12-21 DIAGNOSIS — E11359 Type 2 diabetes mellitus with proliferative diabetic retinopathy without macular edema: Secondary | ICD-10-CM

## 2013-12-21 DIAGNOSIS — E1139 Type 2 diabetes mellitus with other diabetic ophthalmic complication: Secondary | ICD-10-CM

## 2013-12-21 LAB — HM DIABETES EYE EXAM

## 2014-01-03 ENCOUNTER — Other Ambulatory Visit: Payer: Self-pay | Admitting: Internal Medicine

## 2014-01-04 MED ORDER — INSULIN ASPART 100 UNIT/ML FLEXPEN: PEN_INJECTOR | SUBCUTANEOUS | Status: DC

## 2014-01-04 NOTE — Telephone Encounter (Signed)
Rx called in to pharmacy. 

## 2014-01-04 NOTE — Addendum Note (Signed)
Addended by: Bertha Stakes on: 01/04/2014 10:11 AM   Modules accepted: Orders

## 2014-01-05 ENCOUNTER — Other Ambulatory Visit: Payer: Self-pay | Admitting: *Deleted

## 2014-01-05 DIAGNOSIS — E1142 Type 2 diabetes mellitus with diabetic polyneuropathy: Secondary | ICD-10-CM

## 2014-01-05 MED ORDER — HYDROCODONE-ACETAMINOPHEN 5-325 MG PO TABS: ORAL_TABLET | ORAL | Status: DC

## 2014-01-05 NOTE — Telephone Encounter (Signed)
Last refill 5/28  # 180

## 2014-01-05 NOTE — Telephone Encounter (Signed)
Refill printed and signed and provided to refill nurse. 

## 2014-01-05 NOTE — Telephone Encounter (Signed)
Rx given to pt. 

## 2014-01-18 ENCOUNTER — Encounter (INDEPENDENT_AMBULATORY_CARE_PROVIDER_SITE_OTHER): Payer: PRIVATE HEALTH INSURANCE | Admitting: Ophthalmology

## 2014-01-18 DIAGNOSIS — E1165 Type 2 diabetes mellitus with hyperglycemia: Secondary | ICD-10-CM

## 2014-01-18 DIAGNOSIS — E1139 Type 2 diabetes mellitus with other diabetic ophthalmic complication: Secondary | ICD-10-CM

## 2014-01-18 DIAGNOSIS — E11311 Type 2 diabetes mellitus with unspecified diabetic retinopathy with macular edema: Secondary | ICD-10-CM

## 2014-01-18 DIAGNOSIS — H43819 Vitreous degeneration, unspecified eye: Secondary | ICD-10-CM

## 2014-01-18 DIAGNOSIS — E11319 Type 2 diabetes mellitus with unspecified diabetic retinopathy without macular edema: Secondary | ICD-10-CM

## 2014-01-18 DIAGNOSIS — H35039 Hypertensive retinopathy, unspecified eye: Secondary | ICD-10-CM

## 2014-01-18 DIAGNOSIS — H2510 Age-related nuclear cataract, unspecified eye: Secondary | ICD-10-CM

## 2014-01-18 DIAGNOSIS — E11359 Type 2 diabetes mellitus with proliferative diabetic retinopathy without macular edema: Secondary | ICD-10-CM

## 2014-01-18 DIAGNOSIS — I10 Essential (primary) hypertension: Secondary | ICD-10-CM

## 2014-01-18 LAB — HM DIABETES EYE EXAM

## 2014-01-24 ENCOUNTER — Encounter: Payer: Self-pay | Admitting: Internal Medicine

## 2014-01-24 ENCOUNTER — Ambulatory Visit (INDEPENDENT_AMBULATORY_CARE_PROVIDER_SITE_OTHER): Payer: PRIVATE HEALTH INSURANCE | Admitting: Internal Medicine

## 2014-01-24 VITALS — BP 136/81 | HR 75 | Temp 97.8°F | Ht 78.0 in | Wt >= 6400 oz

## 2014-01-24 DIAGNOSIS — L84 Corns and callosities: Secondary | ICD-10-CM

## 2014-01-24 DIAGNOSIS — E1165 Type 2 diabetes mellitus with hyperglycemia: Principal | ICD-10-CM

## 2014-01-24 DIAGNOSIS — I10 Essential (primary) hypertension: Secondary | ICD-10-CM

## 2014-01-24 DIAGNOSIS — D649 Anemia, unspecified: Secondary | ICD-10-CM

## 2014-01-24 DIAGNOSIS — N189 Chronic kidney disease, unspecified: Secondary | ICD-10-CM

## 2014-01-24 DIAGNOSIS — I129 Hypertensive chronic kidney disease with stage 1 through stage 4 chronic kidney disease, or unspecified chronic kidney disease: Secondary | ICD-10-CM

## 2014-01-24 DIAGNOSIS — IMO0001 Reserved for inherently not codable concepts without codable children: Secondary | ICD-10-CM

## 2014-01-24 LAB — CBC WITH DIFFERENTIAL/PLATELET
BASOS ABS: 0 10*3/uL (ref 0.0–0.1)
BASOS PCT: 0 % (ref 0–1)
Eosinophils Absolute: 0.2 10*3/uL (ref 0.0–0.7)
Eosinophils Relative: 3 % (ref 0–5)
HCT: 36.1 % — ABNORMAL LOW (ref 39.0–52.0)
HEMOGLOBIN: 12.3 g/dL — AB (ref 13.0–17.0)
Lymphocytes Relative: 33 % (ref 12–46)
Lymphs Abs: 1.7 10*3/uL (ref 0.7–4.0)
MCH: 28.6 pg (ref 26.0–34.0)
MCHC: 34.1 g/dL (ref 30.0–36.0)
MCV: 84 fL (ref 78.0–100.0)
Monocytes Absolute: 0.6 10*3/uL (ref 0.1–1.0)
Monocytes Relative: 11 % (ref 3–12)
NEUTROS PCT: 53 % (ref 43–77)
Neutro Abs: 2.8 10*3/uL (ref 1.7–7.7)
Platelets: 146 10*3/uL — ABNORMAL LOW (ref 150–400)
RBC: 4.3 MIL/uL (ref 4.22–5.81)
RDW: 15.3 % (ref 11.5–15.5)
WBC: 5.2 10*3/uL (ref 4.0–10.5)

## 2014-01-24 LAB — COMPLETE METABOLIC PANEL WITH GFR
ALT: 24 U/L (ref 0–53)
AST: 24 U/L (ref 0–37)
Albumin: 3.7 g/dL (ref 3.5–5.2)
Alkaline Phosphatase: 137 U/L — ABNORMAL HIGH (ref 39–117)
BILIRUBIN TOTAL: 0.5 mg/dL (ref 0.2–1.2)
BUN: 12 mg/dL (ref 6–23)
CALCIUM: 8.6 mg/dL (ref 8.4–10.5)
CO2: 27 meq/L (ref 19–32)
Chloride: 102 mEq/L (ref 96–112)
Creat: 1.36 mg/dL — ABNORMAL HIGH (ref 0.50–1.35)
GFR, EST NON AFRICAN AMERICAN: 55 mL/min — AB
GFR, Est African American: 64 mL/min
GLUCOSE: 133 mg/dL — AB (ref 70–99)
Potassium: 4 mEq/L (ref 3.5–5.3)
SODIUM: 137 meq/L (ref 135–145)
TOTAL PROTEIN: 6.4 g/dL (ref 6.0–8.3)

## 2014-01-24 LAB — GLUCOSE, CAPILLARY: Glucose-Capillary: 148 mg/dL — ABNORMAL HIGH (ref 70–99)

## 2014-01-24 NOTE — Assessment & Plan Note (Signed)
Lab Results  Component Value Date   CREATININE 1.42* 07/26/2013   CREATININE 1.51* 12/28/2012   CREATININE 1.60* 12/08/2012   CREATININE 1.75* 11/02/2012   CREATININE 1.43* 09/07/2012     Assessment: Stable chronic renal insufficiency likely due to diabetes and hypertension.  Plan: Check metabolic panel today.

## 2014-01-24 NOTE — Assessment & Plan Note (Signed)
BP Readings from Last 3 Encounters:  01/24/14 136/81  12/13/13 131/78  08/22/13 149/82    Lab Results  Component Value Date   NA 136 07/26/2013   K 4.7 07/26/2013   CREATININE 1.42* 07/26/2013    Assessment: Blood pressure control: controlled Progress toward BP goal:  at goal Comments: Doing well on enalapril 20 mg daily and furosemide 40 mg each morning and 20 mg each evening  Plan: Medications:  continue current medications

## 2014-01-24 NOTE — Patient Instructions (Signed)
General Instructions: Continue current medications   Progress Toward Treatment Goals:  Treatment Goal 01/24/2014  Hemoglobin A1C unchanged  Blood pressure at goal  Prevent falls -    Self Care Goals & Plans:  Self Care Goal 12/13/2013  Manage my medications take my medicines as prescribed; refill my medications on time  Monitor my health keep track of my blood glucose; bring my glucose meter and log to each visit  Eat healthy foods drink diet soda or water instead of juice or soda; eat foods that are low in salt; eat baked foods instead of fried foods; eat smaller portions  Be physically active -  Prevent falls -  Meeting treatment goals -    Home Blood Glucose Monitoring 01/24/2014  Check my blood sugar 4 times a day  When to check my blood sugar before meals; at bedtime     Care Management & Community Referrals:  Referral 01/24/2014  Referrals made for care management support diabetes educator  Referrals made to community resources none

## 2014-01-24 NOTE — Assessment & Plan Note (Signed)
Lab Results  Component Value Date   HGBA1C 8.6 12/13/2013   HGBA1C 8.3 08/22/2013   HGBA1C 9.0 05/24/2013     Assessment: Diabetes control: fair control Progress toward A1C goal:  unchanged Comments: Patient's blood sugar control has improved compared to his last visit, with an average value that is 20 points lower.  He appears to be doing well on his current regimen.  Plan: Medications:  continue current medications, (Lantus insulin 70 units twice a day and NovoLog flex pen insulin 50 units before breakfast, 35 units before lunch, and 52 units before dinner +1 unit for every 10 mg/dL above 100 mg/dL and instructions to reduce each mealtime dose by 10 units on exercise days Home glucose monitoring: Frequency: 4 times a day Timing: before meals;at bedtime Instruction/counseling given: discussed foot care

## 2014-01-24 NOTE — Progress Notes (Signed)
   Subjective:    Patient ID: [name], male    DOB: [birth year], 63 y.o.   MRN: [id number]  HPI Patient returns for management and followup of his diabetes mellitus, hypertension, chronic renal insufficiency, and other chronic medical problems.  Today he has no acute complaints.  His blood glucose log shows some improvement in his hyperglycemia, with an average value over the past month of 193, which is 20 points lower than at the time of his last visit.  He has had only an occasional low blood sugar whenever he skipped a meal, none below 60.  He reports that he is compliant with his medications.  He has chronic calluses on his left foot but has had no recent open wounds.  He has had no recent falls, and reports that his dizziness largely resolved after we reduced his amitriptyline dose several months ago.  Review of Systems  Constitutional: Negative for fever, chills and diaphoresis.  Respiratory: Positive for shortness of breath (Chronic stable mild exertional dyspnea).   Cardiovascular: Negative for chest pain.  Gastrointestinal: Positive for nausea (Occasional). Negative for vomiting and abdominal pain.  Genitourinary: Negative for dysuria and frequency.  Musculoskeletal: Negative for arthralgias.  Neurological: Positive for light-headedness (Occasional). Negative for syncope.    I reviewed and updated the allergies, medications, past medical history, past surgical history, family history, and social history.     Objective:   Physical Exam  Constitutional: No distress.  Cardiovascular: Normal rate, regular rhythm and normal heart sounds.  Exam reveals no gallop and no friction rub.   No murmur heard. 1+ bilateral leg edema  Pulmonary/Chest: Effort normal and breath sounds normal. No respiratory distress. He has no wheezes. He has no rales.  Abdominal: Soft. Bowel sounds are normal. He exhibits no distension. There is no tenderness. There is no rebound and no guarding.  Skin:             Assessment & Plan:

## 2014-01-24 NOTE — Assessment & Plan Note (Signed)
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  In the past, he has intermittently has had open wounds managed at the [facility], but currently has no open wounds.  He now has diabetic shoes that fit well and protect his feet, and is hopeful that his calluses will improve since his footwear is better.  He felt that his previous diabetic foot wear aggravated the calluses.   Plan: Monitor foot exam regularly; I advised patient to let us know if he develops any fissures or open wounds.

## 2014-01-24 NOTE — Assessment & Plan Note (Signed)
Hemoglobin  Date Value Ref Range Status  07/26/2013 11.8* 13.0 - 17.0 g/dL Final  11/02/2012 12.4* 13.0 - 17.0 g/dL Final  06/01/2012 12.1* 13.0 - 17.0 g/dL Final    Assessment: Mild asymptomatic chronic anemia, likely anemia of chronic disease.   Plan: Check CBC today.

## 2014-01-31 ENCOUNTER — Other Ambulatory Visit: Payer: Self-pay | Admitting: *Deleted

## 2014-01-31 DIAGNOSIS — E1142 Type 2 diabetes mellitus with diabetic polyneuropathy: Secondary | ICD-10-CM

## 2014-02-01 ENCOUNTER — Other Ambulatory Visit: Payer: Self-pay | Admitting: *Deleted

## 2014-02-01 MED ORDER — HYDROCODONE-ACETAMINOPHEN 5-325 MG PO TABS: ORAL_TABLET | ORAL | Status: DC

## 2014-02-01 NOTE — Telephone Encounter (Signed)
Refill request already made

## 2014-02-01 NOTE — Telephone Encounter (Signed)
Refill printed and signed and provided to refill nurse. 

## 2014-02-01 NOTE — Telephone Encounter (Signed)
Pt informed Rx is ready 

## 2014-02-10 ENCOUNTER — Other Ambulatory Visit: Payer: Self-pay | Admitting: Internal Medicine

## 2014-02-15 ENCOUNTER — Encounter (INDEPENDENT_AMBULATORY_CARE_PROVIDER_SITE_OTHER): Payer: PRIVATE HEALTH INSURANCE | Admitting: Ophthalmology

## 2014-02-15 DIAGNOSIS — E1165 Type 2 diabetes mellitus with hyperglycemia: Secondary | ICD-10-CM

## 2014-02-15 DIAGNOSIS — H2510 Age-related nuclear cataract, unspecified eye: Secondary | ICD-10-CM

## 2014-02-15 DIAGNOSIS — H43819 Vitreous degeneration, unspecified eye: Secondary | ICD-10-CM

## 2014-02-15 DIAGNOSIS — E11359 Type 2 diabetes mellitus with proliferative diabetic retinopathy without macular edema: Secondary | ICD-10-CM

## 2014-02-15 DIAGNOSIS — E11319 Type 2 diabetes mellitus with unspecified diabetic retinopathy without macular edema: Secondary | ICD-10-CM

## 2014-02-15 DIAGNOSIS — H35039 Hypertensive retinopathy, unspecified eye: Secondary | ICD-10-CM

## 2014-02-15 DIAGNOSIS — E1139 Type 2 diabetes mellitus with other diabetic ophthalmic complication: Secondary | ICD-10-CM

## 2014-02-15 DIAGNOSIS — E11311 Type 2 diabetes mellitus with unspecified diabetic retinopathy with macular edema: Secondary | ICD-10-CM

## 2014-02-15 DIAGNOSIS — I10 Essential (primary) hypertension: Secondary | ICD-10-CM

## 2014-02-15 LAB — HM DIABETES EYE EXAM

## 2014-02-16 ENCOUNTER — Telehealth: Payer: Self-pay | Admitting: Dietician

## 2014-02-16 NOTE — Telephone Encounter (Signed)
Patient called to schedule an appointment with CDE. It was scheduled on 02/20/14

## 2014-02-20 ENCOUNTER — Encounter: Payer: Self-pay | Admitting: Dietician

## 2014-02-20 ENCOUNTER — Ambulatory Visit (INDEPENDENT_AMBULATORY_CARE_PROVIDER_SITE_OTHER): Payer: PRIVATE HEALTH INSURANCE | Admitting: Dietician

## 2014-02-20 VITALS — Wt >= 6400 oz

## 2014-02-20 DIAGNOSIS — E1165 Type 2 diabetes mellitus with hyperglycemia: Principal | ICD-10-CM

## 2014-02-20 DIAGNOSIS — IMO0001 Reserved for inherently not codable concepts without codable children: Secondary | ICD-10-CM

## 2014-02-20 NOTE — Patient Instructions (Signed)
It was good to see you today [name]! Please make an appointment with me in October- November 2015.   You are doing a great job caring for your diabetes.   As a reminder, you could put your mirror on the floor by or under the chair where you put your shoes and socks on and off.   It would probably be best to wear your larger shoes until you can get new ones in January.  Please check your blood sugar before going for a walk and carry glucose tablets in your pocket.

## 2014-02-20 NOTE — Progress Notes (Signed)
Medical Nutrition Therapy:  Appt start time: 0981 end time:  1045. ( 15 minutes on MNT and 15 minutes on foot exam)  Assessment:  Primary concerns today: Weight management, Blood sugar control and Meal planning.   He reports continued improvements on eating and buying healthier food choices. Reports that he is drinking Vegetable V-8 juice now and diet drinks, more eggs and food that do not increase his blood sugar.  Had a regular gatorade today so he would not have a low blood sugar while on his way here.  Weight decreased 7#.  A1C improved.  CBG 167 today after breakfast and 60 units of Novolog at acceptable level Meter download: average is 201, 68% above 180 mg/dl and 31% in target range of < 180Checked blood sugar 2.7 times a day, average is improved from 08/2013 visit. Pattern is stable ~210-220 with dip midday to 180 range.  Only 1 low blood sugar after lighter than usual lunch and activity.  Insulin: 140 units lantus/day & 50- 40- 52 units plus correction when > 200 mg/dl 160-170 units Novolog/day for TDD of 307 units or ~1.5 u/kg.  Breakfast today: gatorade and honey bun Activity:Patient is walking near house for exercise because he has been getting dizzy and is afraid to go the Silver sneakers where he would be alone and far fro his house. Reports dizzy when going from sitting to standing, standing to laying, turning over while laying in bed.   Progress Towards Goal(s):  Some progress with weight, some progress with A1C.   Nutritional Diagnosis:   [place]-3.3 Overweight/obesity As related to poor food choices, increased hunger& high insulin intake and decreased activity Is improving As evidenced by his weight loss and reported increased activity. BMI of ~ 46.8    Intervention:  Nutrition support provided for continued healthier food choices, activity, blood sugar control.  Reviewed meter download with patient and meter averages. Coordination of care-  Foot exam and education done  today.  Monitoring/Evaluation:  Dietary intake, exercise, meter and blood sugars, and body weight in 3 month(s).

## 2014-03-05 ENCOUNTER — Other Ambulatory Visit: Payer: Self-pay | Admitting: Internal Medicine

## 2014-03-05 DIAGNOSIS — E1142 Type 2 diabetes mellitus with diabetic polyneuropathy: Secondary | ICD-10-CM

## 2014-03-05 MED ORDER — HYDROCODONE-ACETAMINOPHEN 5-325 MG PO TABS: ORAL_TABLET | ORAL | Status: DC

## 2014-03-05 NOTE — Telephone Encounter (Signed)
Refill for hydrocodone -acetaminophen printed and signed - nurse to complete.

## 2014-03-13 ENCOUNTER — Encounter (INDEPENDENT_AMBULATORY_CARE_PROVIDER_SITE_OTHER): Payer: PRIVATE HEALTH INSURANCE | Admitting: Ophthalmology

## 2014-03-13 DIAGNOSIS — E1165 Type 2 diabetes mellitus with hyperglycemia: Secondary | ICD-10-CM

## 2014-03-13 DIAGNOSIS — E11311 Type 2 diabetes mellitus with unspecified diabetic retinopathy with macular edema: Secondary | ICD-10-CM

## 2014-03-13 DIAGNOSIS — E1139 Type 2 diabetes mellitus with other diabetic ophthalmic complication: Secondary | ICD-10-CM

## 2014-03-13 DIAGNOSIS — H43819 Vitreous degeneration, unspecified eye: Secondary | ICD-10-CM

## 2014-03-13 DIAGNOSIS — I10 Essential (primary) hypertension: Secondary | ICD-10-CM

## 2014-03-13 DIAGNOSIS — E11359 Type 2 diabetes mellitus with proliferative diabetic retinopathy without macular edema: Secondary | ICD-10-CM

## 2014-03-13 DIAGNOSIS — H35039 Hypertensive retinopathy, unspecified eye: Secondary | ICD-10-CM

## 2014-03-13 LAB — HM DIABETES EYE EXAM

## 2014-03-28 ENCOUNTER — Encounter: Payer: Self-pay | Admitting: Internal Medicine

## 2014-03-28 ENCOUNTER — Ambulatory Visit (INDEPENDENT_AMBULATORY_CARE_PROVIDER_SITE_OTHER): Payer: PRIVATE HEALTH INSURANCE | Admitting: Internal Medicine

## 2014-03-28 VITALS — BP 164/69 | HR 80 | Temp 97.9°F | Ht 78.0 in | Wt >= 6400 oz

## 2014-03-28 DIAGNOSIS — L84 Corns and callosities: Secondary | ICD-10-CM

## 2014-03-28 DIAGNOSIS — Z23 Encounter for immunization: Secondary | ICD-10-CM

## 2014-03-28 DIAGNOSIS — I10 Essential (primary) hypertension: Secondary | ICD-10-CM

## 2014-03-28 DIAGNOSIS — E1142 Type 2 diabetes mellitus with diabetic polyneuropathy: Secondary | ICD-10-CM

## 2014-03-28 DIAGNOSIS — IMO0001 Reserved for inherently not codable concepts without codable children: Secondary | ICD-10-CM

## 2014-03-28 DIAGNOSIS — E1149 Type 2 diabetes mellitus with other diabetic neurological complication: Secondary | ICD-10-CM

## 2014-03-28 DIAGNOSIS — E1165 Type 2 diabetes mellitus with hyperglycemia: Principal | ICD-10-CM

## 2014-03-28 LAB — GLUCOSE, CAPILLARY: Glucose-Capillary: 283 mg/dL — ABNORMAL HIGH (ref 70–99)

## 2014-03-28 LAB — BASIC METABOLIC PANEL WITH GFR
BUN: 19 mg/dL (ref 6–23)
CALCIUM: 9.3 mg/dL (ref 8.4–10.5)
CO2: 28 mEq/L (ref 19–32)
Chloride: 102 mEq/L (ref 96–112)
Creat: 1.44 mg/dL — ABNORMAL HIGH (ref 0.50–1.35)
GFR, EST AFRICAN AMERICAN: 59 mL/min — AB
GFR, EST NON AFRICAN AMERICAN: 51 mL/min — AB
Glucose, Bld: 231 mg/dL — ABNORMAL HIGH (ref 70–99)
Potassium: 4.3 mEq/L (ref 3.5–5.3)
SODIUM: 137 meq/L (ref 135–145)

## 2014-03-28 LAB — POCT GLYCOSYLATED HEMOGLOBIN (HGB A1C): HEMOGLOBIN A1C: 8.4

## 2014-03-28 MED ORDER — HYDROCODONE-ACETAMINOPHEN 5-325 MG PO TABS: ORAL_TABLET | ORAL | Status: DC

## 2014-03-28 MED ORDER — ZOSTER VACCINE LIVE 19400 UNT/0.65ML ~~LOC~~ SOLR: 0.6500 mL | Freq: Once | SUBCUTANEOUS | Status: DC

## 2014-03-28 MED ORDER — INSULIN ASPART 100 UNIT/ML FLEXPEN: PEN_INJECTOR | SUBCUTANEOUS | Status: DC

## 2014-03-28 MED ORDER — ENALAPRIL MALEATE 10 MG PO TABS: 30.0000 mg | ORAL_TABLET | Freq: Every day | ORAL | Status: DC

## 2014-03-28 NOTE — Assessment & Plan Note (Signed)
Assessment: Patient has diabetic peripheral neuropathy with chronic pain.  His pain is well controlled on amitriptyline 25 mg daily at bedtime and hydrocodone-acetaminophen 5-325 mg 1-1/2 tablets by mouth every 6 hours as needed for pain.  However, I am concerned that amitriptyline may be a contributing factor to his dizziness; he had worse dizziness several months ago that improved substantially when I reduced his amitriptyline dose then.  Plan: Stop amitriptyline; continue hydrocodone/acetaminophen at current dose.

## 2014-03-28 NOTE — Assessment & Plan Note (Signed)
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  In the past, he has intermittently has had open wounds managed at the [facility], but currently has no open wounds and the calluses appear stable.  He now has diabetic shoes that fit well and protect his feet.   Plan: Monitor foot exam regularly; I encouraged patient to continue wearing the protective footwear.

## 2014-03-28 NOTE — Assessment & Plan Note (Addendum)
Lab Results  Component Value Date   HGBA1C 8.4 03/28/2014   HGBA1C 8.6 12/13/2013   HGBA1C 8.3 08/22/2013     Assessment: Diabetes control: fair control Progress toward A1C goal:  improved Comments: Patient's A1c is still above goal.  Patient reports that he is taking his insulin regularly, but acknowledges that he does not always check his blood sugar before meals and take his correction insulin.  Plan: Medications:  Continue Lantus insulin 70 units twice a day, increase breakfast NovoLog flex pen insulin to a dose of 54 units before breakfast and continue 35 units before lunch and 52 units before dinner, plus 1 unit for every 10 mg/dL above 100 mg/dL, with instructions to reduce each mealtime dose by 10 units on exercise days Home glucose monitoring: Frequency: 4 times a day Timing: before meals;at bedtime Instruction/counseling given: discussed foot care

## 2014-03-28 NOTE — Patient Instructions (Signed)
Increase enalapril 10 mg to a dose of 3 tablets once daily. Increase breakfast NovoLog dose to 54 units with breakfast plus correction insulin as before; continue other insulin dosing as before. Stop amitriptyline.

## 2014-03-28 NOTE — Assessment & Plan Note (Signed)
BP Readings from Last 3 Encounters:  03/28/14 164/69  01/24/14 136/81  12/13/13 131/78    Lab Results  Component Value Date   NA 137 01/24/2014   K 4.0 01/24/2014   CREATININE 1.36* 01/24/2014    Assessment: Blood pressure control: moderately elevated Progress toward BP goal:  deteriorated Comments: Blood pressure is above goal on enalapril 20 mg daily and furosemide 40 mg each morning and 20 mg each evening.  Plan: Medications:  Increase enalapril to a dose of 30 mg daily; continue furosemide 40 mg each morning and 20 mg each evening.  Will check a basic metabolic panel today and at followup

## 2014-03-28 NOTE — Progress Notes (Signed)
   Subjective:    Patient ID: [name], male    DOB: [birth year], 63 y.o.   MRN: [id number]  HPI Patient presents for followup and management of his diabetes mellitus, painful diabetic neuropathy, foot callus, hypertension, and other chronic medical problems.  Today he reports some intermittent nausea and dizziness, although these are not severe.  He denies any recent falls; he also denies loss of consciousness or neurologic deficit.  He reports that he is compliant with his medications.  His home blood sugar measurements over the past month show an average value of 209, with range from 74 to 410; his values tend to be higher in the morning than in the afternoon or evening.    I reviewed and updated the medication list, allergies, past medical history, past surgical history, family history, and social history.  Review of Systems  Constitutional: Negative for fever, chills and diaphoresis.  Cardiovascular: Negative for chest pain and leg swelling.  Gastrointestinal: Positive for nausea. Negative for vomiting, abdominal pain, diarrhea, constipation and blood in stool.  Genitourinary: Negative for dysuria, frequency and difficulty urinating.  Neurological: Positive for dizziness. Negative for syncope and headaches.       Objective:   Physical Exam  Constitutional: No distress.  Cardiovascular: Normal rate, regular rhythm and normal heart sounds.  Exam reveals no gallop and no friction rub.   No murmur heard. 1+ bilateral ankle edema  Pulmonary/Chest: Effort normal and breath sounds normal. No respiratory distress. He has no wheezes. He has no rales.  Abdominal: Soft. Bowel sounds are normal. He exhibits no distension. There is no tenderness. There is no rebound and no guarding.  Extremities: The callus on the anterior sole of the left foot is stable, with no signs of open wound or associated inflammation/      Assessment & Plan:

## 2014-03-30 ENCOUNTER — Other Ambulatory Visit: Payer: Self-pay | Admitting: Internal Medicine

## 2014-04-05 ENCOUNTER — Other Ambulatory Visit: Payer: Self-pay | Admitting: Oncology

## 2014-04-05 ENCOUNTER — Telehealth: Payer: Self-pay | Admitting: *Deleted

## 2014-04-05 MED ORDER — ENALAPRIL MALEATE 10 MG PO TABS: 10.0000 mg | ORAL_TABLET | Freq: Every day | ORAL | Status: DC

## 2014-04-05 MED ORDER — ENALAPRIL MALEATE 20 MG PO TABS: 20.0000 mg | ORAL_TABLET | Freq: Every day | ORAL | Status: DC

## 2014-04-05 MED ORDER — ENALAPRIL MALEATE 10 MG PO TABS: 20.0000 mg | ORAL_TABLET | Freq: Every day | ORAL | Status: DC

## 2014-04-05 NOTE — Telephone Encounter (Signed)
OK  I will send in RX for a 20 mg size enalapril.  How senseless is this on insurance company's part?!

## 2014-04-05 NOTE — Telephone Encounter (Addendum)
Pt takes enalapril 10 mg tabs, take 3 tabs daily( dose increase at last visit).    Insurance will not cover this dose-will only pay for 2 tabs per day max.  Pt can take a 20 mg and a 10 mg tab.  Will send to attending pool, as pcp is currently out of office/unavailable, please advise.[name]9/24/20152:25 PM

## 2014-04-06 ENCOUNTER — Telehealth: Payer: Self-pay | Admitting: *Deleted

## 2014-04-06 NOTE — Telephone Encounter (Signed)
Call to pt to inform him of the need to take 20 mg of the Enlapril and 10 mg in the pm per Dr. Beryle Beams.  Pt has 10 mg tablets at home and a prescription for the 20 mg Enalapril tablets has been sent to his pharmacy.  Pt voiced an understanding of the plan.  [name], RN 04/06/2014 2:42 PM

## 2014-04-10 ENCOUNTER — Encounter (INDEPENDENT_AMBULATORY_CARE_PROVIDER_SITE_OTHER): Payer: PRIVATE HEALTH INSURANCE | Admitting: Ophthalmology

## 2014-04-16 ENCOUNTER — Encounter (INDEPENDENT_AMBULATORY_CARE_PROVIDER_SITE_OTHER): Payer: PRIVATE HEALTH INSURANCE | Admitting: Ophthalmology

## 2014-04-16 DIAGNOSIS — H43813 Vitreous degeneration, bilateral: Secondary | ICD-10-CM

## 2014-04-16 DIAGNOSIS — E11311 Type 2 diabetes mellitus with unspecified diabetic retinopathy with macular edema: Secondary | ICD-10-CM

## 2014-04-16 DIAGNOSIS — I10 Essential (primary) hypertension: Secondary | ICD-10-CM

## 2014-04-16 DIAGNOSIS — E11331 Type 2 diabetes mellitus with moderate nonproliferative diabetic retinopathy with macular edema: Secondary | ICD-10-CM

## 2014-04-16 DIAGNOSIS — H35033 Hypertensive retinopathy, bilateral: Secondary | ICD-10-CM

## 2014-04-16 DIAGNOSIS — E11351 Type 2 diabetes mellitus with proliferative diabetic retinopathy with macular edema: Secondary | ICD-10-CM

## 2014-04-27 ENCOUNTER — Encounter: Payer: Self-pay | Admitting: Internal Medicine

## 2014-04-27 ENCOUNTER — Ambulatory Visit (INDEPENDENT_AMBULATORY_CARE_PROVIDER_SITE_OTHER): Payer: PRIVATE HEALTH INSURANCE | Admitting: Internal Medicine

## 2014-04-27 VITALS — BP 146/69 | HR 74 | Temp 98.0°F | Ht 78.0 in | Wt >= 6400 oz

## 2014-04-27 DIAGNOSIS — IMO0002 Reserved for concepts with insufficient information to code with codable children: Secondary | ICD-10-CM

## 2014-04-27 DIAGNOSIS — I10 Essential (primary) hypertension: Secondary | ICD-10-CM

## 2014-04-27 DIAGNOSIS — E1165 Type 2 diabetes mellitus with hyperglycemia: Secondary | ICD-10-CM

## 2014-04-27 LAB — BASIC METABOLIC PANEL WITH GFR
BUN: 16 mg/dL (ref 6–23)
CHLORIDE: 103 meq/L (ref 96–112)
CO2: 26 mEq/L (ref 19–32)
Calcium: 9.7 mg/dL (ref 8.4–10.5)
Creat: 1.47 mg/dL — ABNORMAL HIGH (ref 0.50–1.35)
GFR, EST AFRICAN AMERICAN: 57 mL/min — AB
GFR, EST NON AFRICAN AMERICAN: 50 mL/min — AB
GLUCOSE: 227 mg/dL — AB (ref 70–99)
Potassium: 4.7 mEq/L (ref 3.5–5.3)
SODIUM: 134 meq/L — AB (ref 135–145)

## 2014-04-27 MED ORDER — ENALAPRIL MALEATE 20 MG PO TABS: 20.0000 mg | ORAL_TABLET | Freq: Two times a day (BID) | ORAL | Status: DC

## 2014-04-27 MED ORDER — AMLODIPINE BESYLATE 5 MG PO TABS: 5.0000 mg | ORAL_TABLET | Freq: Every day | ORAL | Status: DC

## 2014-04-27 NOTE — Progress Notes (Signed)
Patient ID: [name], male   DOB: 1950/12/08, 63 y.o.   MRN: [id number]   Subjective:   HPI: [name] is a 63 y.o. gentleman with past medical history of uncontrolled diabetes, hypertension, and morbid obesity.   Reason(s) for this visit: Routine followup for his hypertension and diabetes. Please see assessment and plan for details on these 2 problems.  No specific complaints today. He has been feeling well lately.  ROS: Constitutional: Denies fever, chills, diaphoresis, appetite change and fatigue.  Respiratory: Denies SOB, DOE, cough, chest tightness, and wheezing. Denies chest pain. CVS: No chest pain, palpitations and leg swelling.  GI: No abdominal pain, nausea, vomiting, bloody stools GU: No dysuria, frequency, hematuria, or flank pain.  MSK: No myalgias, back pain, joint swelling, arthralgias  Psych: No depression symptoms. No SI or SA.    Objective:  Physical Exam: Filed Vitals:   04/27/14 1030 04/27/14 1119  BP: 156/77 146/69  Pulse: 72 74  Temp: 98 F (36.7 C)   TempSrc: Oral   Height: 6\' 6"  (1.981 m)   Weight: 406 lb 14.4 oz (184.569 kg)   SpO2: 100%    General: Morbidly obese. No acute distress.  HEENT: Normal oral mucosa. MMM.  Lungs: CTA bilaterally. Heart: RRR; no extra sounds or murmurs  Abdomen: Non-distended, normal bowel sounds, soft, nontender; no hepatosplenomegaly  Extremities: No pedal edema. No joint swelling or tenderness. Neurologic: Normal EOM,  Alert and oriented x3. No obvious neurologic/cranial nerve deficits.  Assessment & Plan:  Discussed case with my attending in the clinic, Dr. Lynnae January. See problem based charting.

## 2014-04-27 NOTE — Assessment & Plan Note (Addendum)
Lab Results  Component Value Date   HGBA1C 8.4 03/28/2014   HGBA1C 8.6 12/13/2013   HGBA1C 8.3 08/22/2013     Assessment: Diabetes control: fair control Progress toward A1C goal:  unchanged Comments: Review of his glucose meter indicates several hypoglycemia episodes with the lowest being 59. He reports one episode which was associated with shakiness , but did not require any third person assistance. It quickly recovered with a meal. These episodes seem to be clustered around lunchtime before his meal. Patient reports that usually does not have a lot for breakfast. During his last visit with his PCP, prebreakfast, NovoLog was increased from 52 units  to 54 units. Patient reports compliance with this regimen. In addition to his 35 units pre-lunch and 52 units before dinner, plus 1 unit for every 10 mg/dL above 100 mg/dL, with instructions to reduce each mealtime dose by 10 units on exercise days. He also takes Lantus 70 units twice a day. Also noted tenderness to have high CBG readings around evening time. Also, his fasting blood sugar continues to be above 200 for the most time.  Plan: Medications:  Will cut back his prebreakfast NovoLog dose from 54 units to 50 units as this might mitigate pre-lunchtime hypoglycemia. He will continue with 35 units with lunch. I will increase his dinner coverage to 56 units as this will curb his post dinner hyperglycemia and probably help with his morning glycemic control. Cont with Lantus 70 units bid. Discussed with Dr Lynnae January who also reviewed the meter reading with me prior to above changes.  Home glucose monitoring Frequency: 4 times a day Timing: before breakfast;before lunch;before dinner;after dinner;at bedtime Instruction/counseling given: discussed the need for weight loss Educational resources provided: brochure;handout Self management tools provided: copy of home glucose meter download Other plans: follow up in one month with glucose meter.

## 2014-04-27 NOTE — Addendum Note (Signed)
Addended by: Jessee Avers on: 04/27/2014 03:49 PM   Modules accepted: Level of Service

## 2014-04-27 NOTE — Assessment & Plan Note (Signed)
BP Readings from Last 3 Encounters:  04/27/14 146/69  03/28/14 164/69  01/24/14 136/81    Lab Results  Component Value Date   NA 137 03/28/2014   K 4.3 03/28/2014   CREATININE 1.44* 03/28/2014    Assessment: Blood pressure control: moderately elevated Progress toward BP goal:  unchanged Comments: On enalapril 20 mg in the morning and 10 mg in the evening.  Plan: Medications:  Increase enalapril to 20 mg twice a day. Educational resources provided: Therapist, sports tools provided:   Other plans: Check BMP today. Followup in one month.

## 2014-04-27 NOTE — Patient Instructions (Addendum)
General Instructions: Continue Lantus insulin 70 units twice a day,  Reduce breakfast NovoLog to 50 units before breakfast.  Increase before dinner Novolog to  56 units Continue 35 units before lunch , plus 1 unit for every 10 mg/dL above 100 mg/dL, and reduce each mealtime dose by 10 units on exercise days Please increase Enalpril to 20 mg twice a day .  Please come back in 1 month with your glucose meter  Thank you for bringing your medicines today. This helps [place] keep you safe from mistakes.   Progress Toward Treatment Goals:  Treatment Goal 03/28/2014  Hemoglobin A1C improved  Blood pressure deteriorated  Prevent falls -    Self Care Goals & Plans:  Self Care Goal 04/27/2014  Manage my medications take my medicines as prescribed; bring my medications to every visit; refill my medications on time; follow the sick day instructions if I am sick  Monitor my health keep track of my blood glucose; bring my glucose meter and log to each visit; check my feet daily  Eat healthy foods eat more vegetables; eat fruit for snacks and desserts; eat baked foods instead of fried foods; drink diet soda or water instead of juice or soda  Be physically active find an activity I enjoy  Prevent falls -  Meeting treatment goals -    Home Blood Glucose Monitoring 03/28/2014  Check my blood sugar 4 times a day  When to check my blood sugar before meals; at bedtime     Care Management & Community Referrals:  Referral 03/28/2014  Referrals made for care management support -  Referrals made to community resources none

## 2014-04-30 ENCOUNTER — Other Ambulatory Visit: Payer: Self-pay | Admitting: Internal Medicine

## 2014-04-30 ENCOUNTER — Other Ambulatory Visit: Payer: Self-pay | Admitting: *Deleted

## 2014-04-30 DIAGNOSIS — E1142 Type 2 diabetes mellitus with diabetic polyneuropathy: Secondary | ICD-10-CM

## 2014-04-30 MED ORDER — HYDROCODONE-ACETAMINOPHEN 5-325 MG PO TABS: ORAL_TABLET | ORAL | Status: DC

## 2014-04-30 NOTE — Telephone Encounter (Signed)
Rxs ready - pt called/imformed.

## 2014-04-30 NOTE — Telephone Encounter (Signed)
Refills for hydrocodone 5 mg - acetaminophen 325 mg, take 1 and 1/2 tablets Q6h as needed for pain, printed and signed and provided to refill nurse as follows:  #180, to be filled no earlier than 05/05/2014 #180, to be filled no earlier than 06/04/2014 #180, to be filled no earlier than 07/04/2014

## 2014-05-01 NOTE — Progress Notes (Signed)
[facility] Clinic Attending  Case discussed with Dr. Kazibwe soon after the resident saw the patient.  We reviewed the resident's history and exam and pertinent patient test results.  I agree with the assessment, diagnosis, and plan of care documented in the resident's note. 

## 2014-05-14 ENCOUNTER — Encounter (INDEPENDENT_AMBULATORY_CARE_PROVIDER_SITE_OTHER): Payer: PRIVATE HEALTH INSURANCE | Admitting: Ophthalmology

## 2014-05-14 DIAGNOSIS — H35033 Hypertensive retinopathy, bilateral: Secondary | ICD-10-CM

## 2014-05-14 DIAGNOSIS — I10 Essential (primary) hypertension: Secondary | ICD-10-CM

## 2014-05-14 DIAGNOSIS — H43813 Vitreous degeneration, bilateral: Secondary | ICD-10-CM

## 2014-05-14 DIAGNOSIS — E11351 Type 2 diabetes mellitus with proliferative diabetic retinopathy with macular edema: Secondary | ICD-10-CM

## 2014-05-14 DIAGNOSIS — E11321 Type 2 diabetes mellitus with mild nonproliferative diabetic retinopathy with macular edema: Secondary | ICD-10-CM

## 2014-05-14 DIAGNOSIS — E11311 Type 2 diabetes mellitus with unspecified diabetic retinopathy with macular edema: Secondary | ICD-10-CM

## 2014-05-29 ENCOUNTER — Telehealth: Payer: Self-pay | Admitting: *Deleted

## 2014-05-29 ENCOUNTER — Ambulatory Visit: Payer: PRIVATE HEALTH INSURANCE | Admitting: Internal Medicine

## 2014-05-29 NOTE — Telephone Encounter (Signed)
If he is unable to eat or is vomiting, he needs to go to ED. Otherwise we will see him in the AM. He can try [place] / Colace combo. It is available OTC. He must keep appt in AM.

## 2014-05-29 NOTE — Telephone Encounter (Signed)
Talked with pt and he is able to eat, no vomiting. He has transportation for tomorrow AM.

## 2014-05-29 NOTE — Telephone Encounter (Signed)
Pt called with c/o constipation for over a month.  He states bowels have moved twice in two weeks.  Stool is very hard. Also having abd pain. No diet changes, no changes in meds He has tried Mag Citrate with little results.   He can not come in today, I told him it would be best for [place] to see him about this but he denied appointment as he needs to stay home.  I have scheduled him for tomorrow morning with Dr Redmond Pulling @ 9:15.  Pt # [id number] Do you want to recommend something for him to try tonight?

## 2014-05-30 ENCOUNTER — Encounter: Payer: Self-pay | Admitting: Internal Medicine

## 2014-05-30 ENCOUNTER — Ambulatory Visit (INDEPENDENT_AMBULATORY_CARE_PROVIDER_SITE_OTHER): Payer: PRIVATE HEALTH INSURANCE | Admitting: Internal Medicine

## 2014-05-30 VITALS — BP 148/64 | HR 73 | Temp 98.8°F | Ht 78.0 in | Wt >= 6400 oz

## 2014-05-30 DIAGNOSIS — K5900 Constipation, unspecified: Secondary | ICD-10-CM

## 2014-05-30 DIAGNOSIS — IMO0002 Reserved for concepts with insufficient information to code with codable children: Secondary | ICD-10-CM

## 2014-05-30 DIAGNOSIS — E1165 Type 2 diabetes mellitus with hyperglycemia: Secondary | ICD-10-CM

## 2014-05-30 LAB — COMPLETE METABOLIC PANEL WITH GFR
ALBUMIN: 3.8 g/dL (ref 3.5–5.2)
ALK PHOS: 130 U/L — AB (ref 39–117)
ALT: 22 U/L (ref 0–53)
AST: 18 U/L (ref 0–37)
BILIRUBIN TOTAL: 0.4 mg/dL (ref 0.2–1.2)
BUN: 17 mg/dL (ref 6–23)
CO2: 26 mEq/L (ref 19–32)
Calcium: 8.6 mg/dL (ref 8.4–10.5)
Chloride: 106 mEq/L (ref 96–112)
Creat: 1.53 mg/dL — ABNORMAL HIGH (ref 0.50–1.35)
GFR, Est African American: 55 mL/min — ABNORMAL LOW
GFR, Est Non African American: 47 mL/min — ABNORMAL LOW
Glucose, Bld: 94 mg/dL (ref 70–99)
Potassium: 4.7 mEq/L (ref 3.5–5.3)
SODIUM: 141 meq/L (ref 135–145)
TOTAL PROTEIN: 6.6 g/dL (ref 6.0–8.3)

## 2014-05-30 LAB — CBC
HCT: 36.5 % — ABNORMAL LOW (ref 39.0–52.0)
Hemoglobin: 12 g/dL — ABNORMAL LOW (ref 13.0–17.0)
MCH: 28.5 pg (ref 26.0–34.0)
MCHC: 32.9 g/dL (ref 30.0–36.0)
MCV: 86.7 fL (ref 78.0–100.0)
MPV: 11.9 fL (ref 9.4–12.4)
Platelets: 169 10*3/uL (ref 150–400)
RBC: 4.21 MIL/uL — ABNORMAL LOW (ref 4.22–5.81)
RDW: 14.9 % (ref 11.5–15.5)
WBC: 5.2 10*3/uL (ref 4.0–10.5)

## 2014-05-30 LAB — GLUCOSE, CAPILLARY: GLUCOSE-CAPILLARY: 138 mg/dL — AB (ref 70–99)

## 2014-05-30 LAB — TSH: TSH: 3.006 u[IU]/mL (ref 0.350–4.500)

## 2014-05-30 MED ORDER — DOCUSATE SODIUM 100 MG PO CAPS: 100.0000 mg | ORAL_CAPSULE | Freq: Two times a day (BID) | ORAL | Status: DC

## 2014-05-30 NOTE — Progress Notes (Signed)
   Subjective:    Patient ID: [name], male    DOB: 12-May-1951, 63 y.o.   MRN: [id number]  HPI Comments: [name] is a 63 year old male with a PMH of HTN, OSA, DM type 2 with neuropathy who presents with 1 month c/o constipation.  BMs occuring q3 days to [place].  Denies blood in stool.  Last BM this AM (last one 2-3 days ago).  Previous hx of nausea controlled by Phenergan.  Appetite is good and no vomiting.  He tried MOM once without relief.  He had an easily passable stool after MOM but then went back to constipation.  Has not tried it again.  Has not tried anything else.  Takes Vicodin about twice daily.  Eats three times per day and enjoys greens, string beans with dinner.  Appetite is good, no vomiting or abdominal pain.  Colonoscopy 5 years ago showed diverticula and hemorrhoids.  No fam hx of colon disease or cancer.  No unexplained weight loss.  Not much exercise.  Was going to [facility] but stopped going six months ago.      Review of Systems  Constitutional: Negative for fever, chills, appetite change and unexpected weight change.  Respiratory: Negative for shortness of breath.   Cardiovascular: Negative for chest pain.  Gastrointestinal: Positive for nausea and constipation. Negative for vomiting, abdominal pain, diarrhea and blood in stool.  Genitourinary: Negative for dysuria.       Objective:   Physical Exam  Constitutional: He is oriented to person, place, and time. No distress.  HENT:  Head: Normocephalic and atraumatic.  Mouth/Throat: Oropharynx is clear and moist. No oropharyngeal exudate.  Eyes: EOM are normal. Pupils are equal, round, and reactive to light.  Cardiovascular: Normal rate, regular rhythm and normal heart sounds.  Exam reveals no gallop and no friction rub.   No murmur heard. Pulmonary/Chest: Effort normal and breath sounds normal. No respiratory distress. He has no wheezes. He has no rales.  Abdominal: Soft. Bowel sounds are normal. He exhibits  no distension. There is no tenderness.  Musculoskeletal: Normal range of motion. He exhibits no edema or tenderness.  Neurological: He is alert and oriented to person, place, and time. No cranial nerve deficit.  Skin: Skin is warm. He is not diaphoretic.  Psychiatric: He has a normal mood and affect. His behavior is normal.  Vitals reviewed.         Assessment & Plan:  Please see problem based assessment and plan.

## 2014-05-30 NOTE — Patient Instructions (Signed)
General Instructions: 1. Try docusate two times per day to help with constipation.  I have prescribed this for you but if it is too expensive you can buy this medication over-the-counter (just ask the pharmacist to show you).  Please increase fiber (high fiber/low sugar cereals, oatmeal, vegetables, salads, 100% whole grain products).  If you cannot get enough fiber from your diet you can try metamucil or a similar over-the-counter fiber supplement.  Be sure to drink water.   2. Please take all medications as prescribed.    3. If you have worsening of your symptoms or new symptoms arise, please call the clinic ([phone]), or go to the ER immediately if symptoms are severe.  Return to see Dr. Marinda Elk at your appt next week.  Treatment Goals:  Goals (1 Years of Data) as of 05/30/14          As of Today 04/27/14 04/27/14 03/28/14 01/24/14     Blood Pressure   . Blood Pressure < 140/80  148/64 146/69 156/77 164/69 136/81     Result Component   . HEMOGLOBIN A1C < 7.0     8.4    . LDL CALC < 100            Progress Toward Treatment Goals:  Treatment Goal 04/27/2014  Hemoglobin A1C unchanged  Blood pressure unchanged  Prevent falls -    Self Care Goals & Plans:  Self Care Goal 05/30/2014  Manage my medications take my medicines as prescribed; bring my medications to every visit  Monitor my health keep track of my blood glucose; bring my glucose meter and log to each visit  Eat healthy foods drink diet soda or water instead of juice or soda; eat more vegetables; eat foods that are low in salt; eat baked foods instead of fried foods; eat fruit for snacks and desserts  Be physically active -  Prevent falls -  Meeting treatment goals -    Home Blood Glucose Monitoring 04/27/2014  Check my blood sugar 4 times a day  When to check my blood sugar before breakfast; before lunch; before dinner; after dinner; at bedtime     Care Management & Community Referrals:  Referral 03/28/2014   Referrals made for care management support -  Referrals made to community resources none    Constipation Constipation is when a person has fewer than three bowel movements a week, has difficulty having a bowel movement, or has stools that are dry, hard, or larger than normal. As people grow older, constipation is more common. If you try to fix constipation with medicines that make you have a bowel movement (laxatives), the problem may get worse. Long-term laxative use may cause the muscles of the colon to become weak. A low-fiber diet, not taking in enough fluids, and taking certain medicines may make constipation worse.  CAUSES   Certain medicines, such as antidepressants, pain medicine, iron supplements, antacids, and water pills.   Certain diseases, such as diabetes, irritable bowel syndrome (IBS), thyroid disease, or depression.   Not drinking enough water.   Not eating enough fiber-rich foods.   Stress or travel.   Lack of physical activity or exercise.   Ignoring the urge to have a bowel movement.   Using laxatives too much.  SIGNS AND SYMPTOMS   Having fewer than three bowel movements a week.   Straining to have a bowel movement.   Having stools that are hard, dry, or larger than normal.   Feeling full or bloated.  Pain in the lower abdomen.   Not feeling relief after having a bowel movement.  DIAGNOSIS  Your health care provider will take a medical history and perform a physical exam. Further testing may be done for severe constipation. Some tests may include:  A barium enema X-ray to examine your rectum, colon, and, sometimes, your small intestine.   A sigmoidoscopy to examine your lower colon.   A colonoscopy to examine your entire colon. TREATMENT  Treatment will depend on the severity of your constipation and what is causing it. Some dietary treatments include drinking more fluids and eating more fiber-rich foods. Lifestyle treatments may  include regular exercise. If these diet and lifestyle recommendations do not help, your health care provider may recommend taking over-the-counter laxative medicines to help you have bowel movements. Prescription medicines may be prescribed if over-the-counter medicines do not work.  HOME CARE INSTRUCTIONS   Eat foods that have a lot of fiber, such as fruits, vegetables, whole grains, and beans.  Limit foods high in fat and processed sugars, such as french fries, hamburgers, cookies, candies, and soda.   A fiber supplement may be added to your diet if you cannot get enough fiber from foods.   Drink enough fluids to keep your urine clear or pale yellow.   Exercise regularly or as directed by your health care provider.   Go to the restroom when you have the urge to go. Do not hold it.   Only take over-the-counter or prescription medicines as directed by your health care provider. Do not take other medicines for constipation without talking to your health care provider first.  [place] IF:   You have bright red blood in your stool.   Your constipation lasts for more than 4 days or gets worse.   You have abdominal or rectal pain.   You have thin, pencil-like stools.   You have unexplained weight loss. MAKE SURE YOU:   Understand these instructions.  Will watch your condition.  Will get help right away if you are not doing well or get worse. Document Released: 03/27/2004 Document Revised: 07/04/2013 Document Reviewed: 04/10/2013 [facility] Patient Information 2015 [place], [place]. This information is not intended to replace advice given to you by your health care provider. Make sure you discuss any questions you have with your health care provider.

## 2014-05-31 DIAGNOSIS — K5900 Constipation, unspecified: Secondary | ICD-10-CM | POA: Insufficient documentation

## 2014-05-31 NOTE — Assessment & Plan Note (Addendum)
No appetite change, weight loss, abdominal pain, vomiting to suggest obstruction or malignancy.  He has good bowel sounds no abdominal tenderness or guarding.  His constipation may be due to opioid pain medication.  2010 colonoscopy revealed diverticula and hemorrhoids.   - CBC, CMP, TSH today - trial of colace - advised him to increase dietary fiber, water intake; can try metamucil (or similar) if unable to get enough fiber in diet - return for previously scheduled appt next week

## 2014-05-31 NOTE — Progress Notes (Signed)
Internal Medicine Clinic Attending  Case discussed with Dr. Wilson soon after the resident saw the patient.  We reviewed the resident's history and exam and pertinent patient test results.  I agree with the assessment, diagnosis, and plan of care documented in the resident's note.  

## 2014-06-02 ENCOUNTER — Other Ambulatory Visit: Payer: Self-pay | Admitting: Internal Medicine

## 2014-06-05 ENCOUNTER — Ambulatory Visit: Payer: PRIVATE HEALTH INSURANCE | Admitting: Dietician

## 2014-06-05 ENCOUNTER — Ambulatory Visit: Payer: PRIVATE HEALTH INSURANCE | Admitting: Internal Medicine

## 2014-06-11 ENCOUNTER — Encounter: Payer: Self-pay | Admitting: Internal Medicine

## 2014-06-11 ENCOUNTER — Encounter (INDEPENDENT_AMBULATORY_CARE_PROVIDER_SITE_OTHER): Payer: PRIVATE HEALTH INSURANCE | Admitting: Ophthalmology

## 2014-06-11 ENCOUNTER — Ambulatory Visit (INDEPENDENT_AMBULATORY_CARE_PROVIDER_SITE_OTHER): Payer: PRIVATE HEALTH INSURANCE | Admitting: Internal Medicine

## 2014-06-11 ENCOUNTER — Ambulatory Visit: Payer: PRIVATE HEALTH INSURANCE | Admitting: Pulmonary Disease

## 2014-06-11 ENCOUNTER — Ambulatory Visit: Payer: PRIVATE HEALTH INSURANCE | Admitting: Dietician

## 2014-06-11 VITALS — BP 144/73 | HR 82 | Temp 97.9°F | Ht 78.0 in | Wt >= 6400 oz

## 2014-06-11 DIAGNOSIS — I10 Essential (primary) hypertension: Secondary | ICD-10-CM

## 2014-06-11 DIAGNOSIS — IMO0002 Reserved for concepts with insufficient information to code with codable children: Secondary | ICD-10-CM

## 2014-06-11 DIAGNOSIS — E11331 Type 2 diabetes mellitus with moderate nonproliferative diabetic retinopathy with macular edema: Secondary | ICD-10-CM

## 2014-06-11 DIAGNOSIS — R945 Abnormal results of liver function studies: Principal | ICD-10-CM

## 2014-06-11 DIAGNOSIS — H43813 Vitreous degeneration, bilateral: Secondary | ICD-10-CM

## 2014-06-11 DIAGNOSIS — R7989 Other specified abnormal findings of blood chemistry: Secondary | ICD-10-CM

## 2014-06-11 DIAGNOSIS — E11311 Type 2 diabetes mellitus with unspecified diabetic retinopathy with macular edema: Secondary | ICD-10-CM

## 2014-06-11 DIAGNOSIS — K5900 Constipation, unspecified: Secondary | ICD-10-CM

## 2014-06-11 DIAGNOSIS — E11351 Type 2 diabetes mellitus with proliferative diabetic retinopathy with macular edema: Secondary | ICD-10-CM

## 2014-06-11 DIAGNOSIS — R748 Abnormal levels of other serum enzymes: Secondary | ICD-10-CM | POA: Insufficient documentation

## 2014-06-11 DIAGNOSIS — H35033 Hypertensive retinopathy, bilateral: Secondary | ICD-10-CM

## 2014-06-11 DIAGNOSIS — E1165 Type 2 diabetes mellitus with hyperglycemia: Secondary | ICD-10-CM

## 2014-06-11 LAB — GAMMA GT: GGT: 74 U/L — AB (ref 7–51)

## 2014-06-11 LAB — GLUCOSE, CAPILLARY: Glucose-Capillary: 131 mg/dL — ABNORMAL HIGH (ref 70–99)

## 2014-06-11 NOTE — Progress Notes (Signed)
Internal Medicine Clinic Attending  I saw and evaluated the patient.  I personally confirmed the key portions of the history and exam documented by Dr. Raelene Bott and I reviewed pertinent patient test results.  The assessment, diagnosis, and plan were formulated together and I agree with the documentation in the resident's note.

## 2014-06-11 NOTE — Assessment & Plan Note (Signed)
Patient has had an persistently isolated elevated alkaline phosphatase since as long back as 2008. Patient is not reporting any abdominal pain. There does not seem to be any GGTs in the system. Last abdominal ultrasound was in 2011 was only remarkable for fatty infiltration of the liver but no cholestasis.  - GGT

## 2014-06-11 NOTE — Assessment & Plan Note (Signed)
BP Readings from Last 3 Encounters:  06/11/14 144/73  05/30/14 148/64  04/27/14 146/69    Lab Results  Component Value Date   NA 141 05/30/2014   K 4.7 05/30/2014   CREATININE 1.53* 05/30/2014    Assessment: Blood pressure control: mildly elevated Progress toward BP goal:  unchanged Comments: Patient was recently up titrated from 30 mg of enalapril to 40 mg of enalapril (in 2 divided doses). Review of the chart and patient's medication bag also reveals that patient was started on amlodipine 5 mg at last visit. Patient has not been taking this in the last couple days since he has run out and was not given any refills. No documentation in the chart regarding prescription of this medication.  Plan: Medications:  Continue with enalapril 20 mg twice a day, will not continue the newly added amlodipine 5 mg.

## 2014-06-11 NOTE — Progress Notes (Signed)
   Subjective:    Patient ID: [name], male    DOB: Nov 19, 1950, 63 y.o.   MRN: [id number]  HPI  Patient is a 63 year old gentleman with a history of hypertension, type 2 diabetes, obstructive sleep apnea, who presents for follow-up of constipation.  Please refer to separate problem-list charting for more details.  Review of Systems  Constitutional: Negative for fever and chills.  HENT: Negative for sore throat.   Respiratory: Negative for shortness of breath.   Cardiovascular: Negative for chest pain and palpitations.  Gastrointestinal: Negative for nausea, vomiting, abdominal pain, diarrhea, constipation and blood in stool.  Genitourinary: Negative for dysuria and hematuria.  Skin: Negative for rash.  Neurological: Negative for syncope.  Psychiatric/Behavioral: Negative for suicidal ideas.      Objective:   Physical Exam  Constitutional: He is oriented to person, place, and time. He appears well-developed and well-nourished. No distress.  Obese   HENT:  Head: Normocephalic and atraumatic.  Eyes: EOM are normal. Pupils are equal, round, and reactive to light. No scleral icterus.  Neck: Normal range of motion. Neck supple. No thyromegaly present.  Cardiovascular: Normal rate and regular rhythm.  Exam reveals no gallop and no friction rub.   No murmur heard. Pulmonary/Chest: Effort normal and breath sounds normal. No respiratory distress. He has no wheezes. He has no rales.  Abdominal: Soft. Bowel sounds are normal. He exhibits no distension. There is no tenderness. There is no rebound.  Musculoskeletal: Normal range of motion. He exhibits edema ( Trace bilateral lower extremity edema).  Neurological: He is alert and oriented to person, place, and time. No cranial nerve deficit.  Skin: No rash noted.      Assessment & Plan:  Please refer to separate problem-list charting for more details.

## 2014-06-11 NOTE — Patient Instructions (Signed)
So please continue taking your same regimen of insulin, but try to be consistent with eating your lunch. If you do miss your lunch, then DO NOT take your meal-time insulin at that point. Please continue taking your enalapril for your BP and do not continue with the amlodipine. You can take your colace for constipation as needed.   General Instructions:   Thank you for bringing your medicines today. This helps [place] keep you safe from mistakes.   Progress Toward Treatment Goals:  Treatment Goal 04/27/2014  Hemoglobin A1C unchanged  Blood pressure unchanged  Prevent falls -    Self Care Goals & Plans:  Self Care Goal 06/11/2014  Manage my medications take my medicines as prescribed; bring my medications to every visit; refill my medications on time  Monitor my health keep track of my blood glucose; bring my glucose meter and log to each visit  Eat healthy foods drink diet soda or water instead of juice or soda; eat more vegetables; eat foods that are low in salt; eat baked foods instead of fried foods; eat fruit for snacks and desserts  Be physically active -  Prevent falls -  Meeting treatment goals -    Home Blood Glucose Monitoring 04/27/2014  Check my blood sugar 4 times a day  When to check my blood sugar before breakfast; before lunch; before dinner; after dinner; at bedtime     Care Management & Community Referrals:  Referral 03/28/2014  Referrals made for care management support -  Referrals made to community resources none

## 2014-06-11 NOTE — Assessment & Plan Note (Signed)
Lab Results  Component Value Date   HGBA1C 8.4 03/28/2014   HGBA1C 8.6 12/13/2013   HGBA1C 8.3 08/22/2013     Assessment: Diabetes control: fair control Progress toward A1C goal:  unable to assess Comments: Patient currently on a regimen of Lantus 70 units twice a day along with mealtime 50/35/56 NovoLog. Patient has persistently elevated morning time CBGs and some episodes of hypoglycemia in the early afternoon, though there is very wide variance in these measurements. Patient reports that he sometimes misses lunch and takes his mealtime insulin, which would explain the high variance in these readings, including episodes of hypoglycemia.  Plan: Medications:  continue current medications, encouraged patient to be consistent with meals. Patient instructed to skip mealtime insulin if he misses a meal. Self management tools provided: copy of home glucose meter download, home glucose logbook

## 2014-06-12 ENCOUNTER — Ambulatory Visit: Payer: PRIVATE HEALTH INSURANCE | Admitting: Dietician

## 2014-06-13 ENCOUNTER — Ambulatory Visit: Payer: PRIVATE HEALTH INSURANCE | Admitting: Dietician

## 2014-06-14 ENCOUNTER — Encounter: Payer: Self-pay | Admitting: Dietician

## 2014-06-14 ENCOUNTER — Ambulatory Visit (INDEPENDENT_AMBULATORY_CARE_PROVIDER_SITE_OTHER): Payer: PRIVATE HEALTH INSURANCE | Admitting: Dietician

## 2014-06-14 VITALS — Wt >= 6400 oz

## 2014-06-14 DIAGNOSIS — IMO0002 Reserved for concepts with insufficient information to code with codable children: Secondary | ICD-10-CM

## 2014-06-14 DIAGNOSIS — E1165 Type 2 diabetes mellitus with hyperglycemia: Secondary | ICD-10-CM

## 2014-06-14 NOTE — Patient Instructions (Signed)
Your goal it to eat something midday when you take the 35 units of rapid acting insulin.

## 2014-06-14 NOTE — Progress Notes (Signed)
Medical Nutrition Therapy:  Appt start time: 0945 end time:  1025. Last visit 02/2014 Assessment:  Primary concerns today: Weight management, Blood sugar control and Meal planning.   He reports continued efforts on improving his eating and buying healthier food choices. Reports that he is drinking water and diet drinks.  Feet doing well, bowels moving normally. CBG 127 today after breakfast and 50 units of Novolog at acceptable level. Trying to eat something midday every day rather than skipping 2-3 days a week.Also verbalizes that he is thinking about trying to carry a rapid acting insulin pen with him when he eats out.  Weight decreased ~1-2# since August.  Meter download: average is 189- improved from August. Fasting too high, consider increased basal at night.    Insulin: 140 units lantus/day & 50- 35 (misses this 2-3 days a week) - 56 units  for TDD of 271 units or ~1.47 u/kg.  Food intake:   Breakfast today: 32 oz 1% milk and a banana- ~ 450-500 calories   Lunch: hamburger, chicken, rice and gravy, diet drink or water  Dinner: 3-4 slices Pizza,"3 pieces" of steak and cheese stromboli with onions and peppers, sugar free punch  Snack- Puffin corn or chips, is thinking about trying to eat more soft fruit Activity:walks   Progress Towards Goal(s):  No progress with weight, some progress with A1C.   Nutritional Diagnosis:   [place]-3.3 Overweight/obesity As related to poor food choices, increased hunger& high insulin intake and decreased activity continues to slowly improve As evidenced by his weight loss of ~ 1-2# since last visit.    Intervention:  Nutrition support provided for continued healthier food choices, activity, blood sugar control.  Reviewed meter download with patient and meter averages. Coordination of care- consider better basal insulin coverage overnight vs better coverage of snacks.  Could use levemir 90 in PM and 50 in am   Monitoring/Evaluation:  Dietary intake, exercise, meter  and blood sugars, and body weight in 1 month(s).

## 2014-06-15 LAB — MICROALBUMIN / CREATININE URINE RATIO
CREATININE, URINE: 50.7 mg/dL
MICROALB UR: 4.6 mg/dL — AB (ref <2.0)
MICROALB/CREAT RATIO: 90.7 mg/g — AB (ref 0.0–30.0)

## 2014-07-02 ENCOUNTER — Other Ambulatory Visit: Payer: Self-pay | Admitting: *Deleted

## 2014-07-02 MED ORDER — PROMETHAZINE HCL 25 MG PO TABS: 12.5000 mg | ORAL_TABLET | Freq: Four times a day (QID) | ORAL | Status: DC | PRN

## 2014-07-09 ENCOUNTER — Encounter (INDEPENDENT_AMBULATORY_CARE_PROVIDER_SITE_OTHER): Payer: PRIVATE HEALTH INSURANCE | Admitting: Ophthalmology

## 2014-07-09 ENCOUNTER — Telehealth: Payer: Self-pay | Admitting: Dietician

## 2014-07-09 MED ORDER — INSULIN LISPRO 100 UNIT/ML (KWIKPEN): PEN_INJECTOR | SUBCUTANEOUS | Status: DC

## 2014-07-09 NOTE — Telephone Encounter (Signed)
Patient left a message that he got a letter from his insurance that they will no longer cover Novolog on 07/13/14, but will cover Humulin or Humalog. Suggest changing the Novolog flexpen to a humalog kwickpen same doses.,

## 2014-07-10 ENCOUNTER — Encounter (INDEPENDENT_AMBULATORY_CARE_PROVIDER_SITE_OTHER): Payer: PRIVATE HEALTH INSURANCE | Admitting: Ophthalmology

## 2014-07-11 NOTE — Telephone Encounter (Signed)
received a call from Mickel Baas at [facility] who received Winifred's new rx for Humalog Kwikpen and needs max units/day. The assumption was for 200 units because of the dispensing of 60 ml per month order. I left her a message at [phone] that max dose per day for Humalog is 200 units/day and to call us back if she has any other questions.

## 2014-07-12 ENCOUNTER — Other Ambulatory Visit: Payer: Self-pay | Admitting: Internal Medicine

## 2014-07-16 ENCOUNTER — Ambulatory Visit: Payer: PRIVATE HEALTH INSURANCE | Admitting: Dietician

## 2014-07-18 ENCOUNTER — Encounter: Payer: Self-pay | Admitting: Dietician

## 2014-07-18 ENCOUNTER — Ambulatory Visit (INDEPENDENT_AMBULATORY_CARE_PROVIDER_SITE_OTHER): Payer: Medicare Other | Admitting: Internal Medicine

## 2014-07-18 ENCOUNTER — Encounter: Payer: Self-pay | Admitting: Internal Medicine

## 2014-07-18 ENCOUNTER — Ambulatory Visit (INDEPENDENT_AMBULATORY_CARE_PROVIDER_SITE_OTHER): Payer: Medicare Other | Admitting: Dietician

## 2014-07-18 VITALS — BP 136/66 | HR 76 | Temp 97.7°F | Ht 78.0 in | Wt >= 6400 oz

## 2014-07-18 DIAGNOSIS — E782 Mixed hyperlipidemia: Secondary | ICD-10-CM

## 2014-07-18 DIAGNOSIS — R748 Abnormal levels of other serum enzymes: Secondary | ICD-10-CM | POA: Diagnosis not present

## 2014-07-18 DIAGNOSIS — E559 Vitamin D deficiency, unspecified: Secondary | ICD-10-CM

## 2014-07-18 DIAGNOSIS — E1165 Type 2 diabetes mellitus with hyperglycemia: Secondary | ICD-10-CM

## 2014-07-18 DIAGNOSIS — N189 Chronic kidney disease, unspecified: Secondary | ICD-10-CM | POA: Diagnosis not present

## 2014-07-18 DIAGNOSIS — IMO0002 Reserved for concepts with insufficient information to code with codable children: Secondary | ICD-10-CM

## 2014-07-18 DIAGNOSIS — I10 Essential (primary) hypertension: Secondary | ICD-10-CM | POA: Diagnosis not present

## 2014-07-18 LAB — COMPLETE METABOLIC PANEL WITH GFR
ALT: 18 U/L (ref 0–53)
AST: 16 U/L (ref 0–37)
Albumin: 3.8 g/dL (ref 3.5–5.2)
Alkaline Phosphatase: 122 U/L — ABNORMAL HIGH (ref 39–117)
BILIRUBIN TOTAL: 0.5 mg/dL (ref 0.2–1.2)
BUN: 27 mg/dL — ABNORMAL HIGH (ref 6–23)
CALCIUM: 9.5 mg/dL (ref 8.4–10.5)
CO2: 25 mEq/L (ref 19–32)
Chloride: 104 mEq/L (ref 96–112)
Creat: 1.34 mg/dL (ref 0.50–1.35)
GFR, EST NON AFRICAN AMERICAN: 56 mL/min — AB
GFR, Est African American: 64 mL/min
Glucose, Bld: 161 mg/dL — ABNORMAL HIGH (ref 70–99)
POTASSIUM: 4.4 meq/L (ref 3.5–5.3)
Sodium: 137 mEq/L (ref 135–145)
Total Protein: 6.7 g/dL (ref 6.0–8.3)

## 2014-07-18 LAB — POCT GLYCOSYLATED HEMOGLOBIN (HGB A1C): Hemoglobin A1C: 8.3

## 2014-07-18 LAB — GLUCOSE, CAPILLARY: GLUCOSE-CAPILLARY: 216 mg/dL — AB (ref 70–99)

## 2014-07-18 LAB — LIPID PANEL
CHOL/HDL RATIO: 4.6 ratio
CHOLESTEROL: 146 mg/dL (ref 0–200)
HDL: 32 mg/dL — AB (ref >39)
LDL Cholesterol: 56 mg/dL (ref 0–99)
Triglycerides: 291 mg/dL — ABNORMAL HIGH (ref <150)
VLDL: 58 mg/dL — ABNORMAL HIGH (ref 0–40)

## 2014-07-18 NOTE — Assessment & Plan Note (Signed)
Hepatic Function Panel     Component Value Date/Time   PROT 6.6 05/30/2014 1047   ALBUMIN 3.8 05/30/2014 1047   AST 18 05/30/2014 1047   ALT 22 05/30/2014 1047   ALKPHOS 130* 05/30/2014 1047   BILITOT 0.4 05/30/2014 1047    Lab Results  Component Value Date   ALKPHOS 130* 05/30/2014   ALKPHOS 137* 01/24/2014   ALKPHOS 135* 07/26/2013   ALKPHOS 127* 11/02/2012   ALKPHOS 130* 09/07/2012   ALKPHOS 132* 06/01/2012   ALKPHOS 133* 02/10/2012   ALKPHOS 127* 12/10/2011   ALKPHOS 144* 11/18/2011     Assessment: Patient has a chronic stable mild elevation of his alkaline phosphatase.  A prior ultrasound in July 2011 showed fatty infiltration of the liver.  These findings are consistent with fatty liver disease due to his diabetes mellitus with insulin resistance, obesity, and alcohol intake (in discussion with diabetes educator, patient reported increased alcohol consumption on weekends which is likely a contributing factor).  Plan: Patient would benefit from reduction in his alcohol consumption, and ideally should completely avoid alcohol.  I also discussed with him the benefits of weight loss.

## 2014-07-18 NOTE — Assessment & Plan Note (Signed)
BP Readings from Last 3 Encounters:  07/18/14 136/66  06/11/14 144/73  05/30/14 148/64    Lab Results  Component Value Date   NA 141 05/30/2014   K 4.7 05/30/2014   CREATININE 1.53* 05/30/2014    Assessment: Blood pressure control: controlled Progress toward BP goal:  at goal Comments: Blood pressure is at goal on enalapril 20 mg twice a day and furosemide 40 mg every morning and 20 mg every evening  Plan: Medications:  continue current medications

## 2014-07-18 NOTE — Assessment & Plan Note (Signed)
Lipids:    Component Value Date/Time   CHOL 122 07/26/2013 1044   TRIG 303* 07/26/2013 1044   HDL 24* 07/26/2013 1044   LDLCALC 37 07/26/2013 1044   VLDL 61* 07/26/2013 1044   CHOLHDL 5.1 07/26/2013 1044    Assessment: Patient is doing well on atorvastatin 20 mg daily.  Plan: Check a lipid panel today; continue atorvastatin 20 mg daily pending that result.

## 2014-07-18 NOTE — Assessment & Plan Note (Signed)
VIT D, 25-HYDROXY  Date Value Ref Range Status  06/29/2013 13* 30 - 89 ng/mL Final    Comment:    This assay accurately quantifies Vitamin D, which is the sum of the 25-Hydroxy forms of Vitamin D2 and D3.  Studies have shown that the optimum concentration of 25-Hydroxy Vitamin D is 30 ng/mL or higher.  Concentrations of Vitamin D between 20 and 29 ng/mL are considered to be insufficient and concentrations less than 20 ng/mL are considered to be deficient for Vitamin D.     Assessment: Patient was treated with a course of oral vitamin D supplementation last year (50,000 units once weekly for 6 weeks.)  Plan: Will check a vitamin D level today.

## 2014-07-18 NOTE — Assessment & Plan Note (Addendum)
CREAT  Date Value Ref Range Status  05/30/2014 1.53* 0.50 - 1.35 mg/dL Final  04/27/2014 1.47* 0.50 - 1.35 mg/dL Final  03/28/2014 1.44* 0.50 - 1.35 mg/dL Final  01/24/2014 1.36* 0.50 - 1.35 mg/dL Final  07/26/2013 1.42* 0.50 - 1.35 mg/dL Final     Lab Results  Component Value Date   CREATININE 1.53* 05/30/2014   CREATININE 1.47* 04/27/2014   CREATININE 1.44* 03/28/2014   CREATININE 1.36* 01/24/2014   CREATININE 1.42* 07/26/2013     Assessment: Patient has stable chronic kidney disease in the setting of type 2 diabetes mellitus and hypertension.  Plan: Check a metabolic panel today.

## 2014-07-18 NOTE — Progress Notes (Signed)
Medical Nutrition Therapy:  Appt start time: 0940 end time:  0539. Last visit 06/2014 Assessment:  Primary concerns today: Weight management, Blood sugar control and Meal planning.   He reports  his eating is healthier food choices. A1c decreased slightly despite blood sugars being higher for past 30 days. He forgot to take his Rapid acting insulin with him often over the holidays when eating out. Reports that he is drinking water, diet drinks, ~ 12 servings light beer or wine or liquor on weekend evenings. After discussion about current recommendations for alcohol consumption patient said he is thuinking about cutting back.  Weight increased ~4.6# since December.  Meter download: shows higher blood sugar son weekends, worsened control compared to Nov-Dec download Insulin: 140 units lantus/day & 50- 35 rapid acting (misses this 2-3 days a week) - 56 units  for TDD of 271 units or ~1.47 u/kg.  Food intake:   Breakfast today: 32 oz 1% milk and a banana-   Lunch: not discussed this visit, plan fu next visit  Dinner baked meat, vegetables, mac and cheese or potato salad, diet drink or water Activity:walks and is thinking about going back to the [facility] for more exercise   Progress Towards Goal(s):  No progress with weight, some progress with A1C.   Nutritional Diagnosis:   [place]-3.3 Overweight/obesity As related to poor food/alcohol choices, increased hunger& high insulin intake and decreased activity deteriorated As evidenced by his weight gain of ~ 5# since last visit.    Intervention:  Nutrition support provided for continued healthier food/alcohol choices, activity, blood sugar control.  Reviewed meter download with patient and meter averages. Coordination of care- discussed plans for alcohol consumption on weekends with physician.     Monitoring/Evaluation:  Dietary intake, exercise, meter and blood sugars, and body weight in 1 month(s).Patient to call to make appointment

## 2014-07-18 NOTE — Assessment & Plan Note (Addendum)
Lab Results  Component Value Date   HGBA1C 8.3 07/18/2014   HGBA1C 8.4 03/28/2014   HGBA1C 8.6 12/13/2013     Assessment: Diabetes control: fair control Progress toward A1C goal:  unchanged Comments: Hemoglobin A1c is above goal on current insulin regimen which includes Lantus 70 units twice a day, and Humalog Kwikpen insulin 54 units before breakfast, 35 units before lunch, and 52 units before dinner, plus 1 unit of mealtime correction insulin for every 10 mg/dL above 100 mg/dL.  Plan: Medications:  continue current medications Home glucose monitoring: Frequency: 4 times a day Timing: before breakfast, before lunch, before dinner, after dinner, at bedtime Instruction/counseling given: discussed foot care Educational resources provided: referral to diabetes education class Self management tools provided: copy of home glucose meter download Other plans: Patient saw diabetes educator [name] today.

## 2014-07-18 NOTE — Patient Instructions (Addendum)
On Friday and Saturdays your blood sugars are higher  Please work on decreasing your alcohol intake. The recommended amount of liquor is 3 ounces a day or 2 -12 oz beers or 2 - 4 oz glasses of wine a day.   If you drink, consider decreasing your evening dose of Lantus to 50-55 units when drinking alcohol.  Take your Novolog and meter with you if you go out, check blood sugars every 4-5 hours and take Novolog if needed.    Bedtime blood sugar goal on weekends when using alcohol is 140-180.

## 2014-07-18 NOTE — Patient Instructions (Signed)
General Instructions: Continue current medications.   Progress Toward Treatment Goals:  Treatment Goal 07/18/2014  Hemoglobin A1C unchanged  Blood pressure at goal  Prevent falls -    Self Care Goals & Plans:  Self Care Goal 07/18/2014  Manage my medications take my medicines as prescribed; bring my medications to every visit  Monitor my health -  Eat healthy foods eat baked foods instead of fried foods; drink diet soda or water instead of juice or soda; eat foods that are low in salt  Be physically active -  Prevent falls -  Meeting treatment goals -    Home Blood Glucose Monitoring 07/18/2014  Check my blood sugar 4 times a day  When to check my blood sugar before breakfast; before lunch; before dinner; after dinner; at bedtime     Care Management & Community Referrals:  Referral 07/18/2014  Referrals made for care management support -  Referrals made to community resources none

## 2014-07-18 NOTE — Progress Notes (Signed)
   Subjective:    Patient ID: Karilyn Cota, male    DOB: [birth year], 64 y.o.   MRN: [id number]  HPI Patient returns for management of his type 2 diabetes mellitus, hypertension, and other chronic medical problems.  He is doing well without any acute complaints.  He reports that he is compliant with his medications.  His blood sugars over the past month have ranged from a low of 58 to a high of 443, with an average value of 197.     Review of Systems  Constitutional: Negative for fever, chills and fatigue.  Respiratory: Negative for shortness of breath.   Cardiovascular: Negative for chest pain and leg swelling.  Gastrointestinal: Positive for nausea (Occasional). Negative for vomiting, abdominal pain and blood in stool.  Genitourinary: Negative for dysuria, frequency and difficulty urinating.  Musculoskeletal: Positive for arthralgias (Chronic knee and back pain).  Neurological: Positive for dizziness (Infrequent, much improved). Negative for syncope.       Objective:   Physical Exam  Constitutional: No distress.  Cardiovascular: Normal rate, regular rhythm and normal heart sounds.  Exam reveals no gallop and no friction rub.   No murmur heard. Trace bilateral ankle edema  Pulmonary/Chest: Effort normal and breath sounds normal. No respiratory distress. He has no wheezes. He has no rales.  Abdominal: Soft. Bowel sounds are normal. He exhibits no distension. There is no tenderness. There is no rebound and no guarding.  Skin:             Assessment & Plan:

## 2014-07-19 ENCOUNTER — Encounter (INDEPENDENT_AMBULATORY_CARE_PROVIDER_SITE_OTHER): Payer: Medicare Other | Admitting: Ophthalmology

## 2014-07-19 DIAGNOSIS — H43813 Vitreous degeneration, bilateral: Secondary | ICD-10-CM | POA: Diagnosis not present

## 2014-07-19 DIAGNOSIS — E11311 Type 2 diabetes mellitus with unspecified diabetic retinopathy with macular edema: Secondary | ICD-10-CM

## 2014-07-19 DIAGNOSIS — E11351 Type 2 diabetes mellitus with proliferative diabetic retinopathy with macular edema: Secondary | ICD-10-CM

## 2014-07-19 DIAGNOSIS — E11331 Type 2 diabetes mellitus with moderate nonproliferative diabetic retinopathy with macular edema: Secondary | ICD-10-CM | POA: Diagnosis not present

## 2014-07-19 DIAGNOSIS — G4733 Obstructive sleep apnea (adult) (pediatric): Secondary | ICD-10-CM | POA: Diagnosis not present

## 2014-07-19 DIAGNOSIS — H35033 Hypertensive retinopathy, bilateral: Secondary | ICD-10-CM | POA: Diagnosis not present

## 2014-07-19 LAB — VITAMIN D 25 HYDROXY (VIT D DEFICIENCY, FRACTURES): VIT D 25 HYDROXY: 12 ng/mL — AB (ref 30–100)

## 2014-07-20 ENCOUNTER — Telehealth: Payer: Self-pay | Admitting: Internal Medicine

## 2014-07-20 DIAGNOSIS — E559 Vitamin D deficiency, unspecified: Secondary | ICD-10-CM

## 2014-07-20 MED ORDER — VITAMIN D3 1.25 MG (50000 UT) PO CAPS: 1.0000 | ORAL_CAPSULE | ORAL | Status: AC

## 2014-07-20 NOTE — Telephone Encounter (Signed)
VIT D, 25-HYDROXY  Date Value Ref Range Status  07/18/2014 12* 30 - 100 ng/mL Final  06/29/2013 13* 30 - 89 ng/mL Final     Patient's vitamin D level is still quite low; I prescribed vitamin D 50,000 units once weekly for 6 weeks last year, but it is unclear whether patient had the medication filled or took it.  I spoke with him today about the indication for treatment.  The plan is to treat with vitamin D3 50,000 units once a week for 6 weeks, and then recheck a vitamin D level.

## 2014-07-20 NOTE — Assessment & Plan Note (Signed)
Telephone Note  VIT D, 25-HYDROXY  Date Value Ref Range Status  07/18/2014 12* 30 - 100 ng/mL Final  06/29/2013 13* 30 - 89 ng/mL Final     Patient's vitamin D level is still quite low; I prescribed vitamin D 50,000 units once weekly for 6 weeks last year, but it is unclear whether patient had the medication filled or took it.  I spoke with him today about the indication for treatment.  The plan is to treat with vitamin D3 50,000 units once a week for 6 weeks, and then recheck a vitamin D level.

## 2014-07-31 ENCOUNTER — Other Ambulatory Visit: Payer: Self-pay | Admitting: *Deleted

## 2014-07-31 DIAGNOSIS — E1142 Type 2 diabetes mellitus with diabetic polyneuropathy: Secondary | ICD-10-CM

## 2014-07-31 NOTE — Telephone Encounter (Signed)
Pt called in asking if he can take milk of Magnesia.  The bottle had a warning to ask physician  before you take it. Pt is constipation on and off , took colace without relief. Will it be safe for him to take MOM when needed?

## 2014-08-02 MED ORDER — HYDROCODONE-ACETAMINOPHEN 5-325 MG PO TABS: ORAL_TABLET | ORAL | Status: DC

## 2014-08-02 NOTE — Telephone Encounter (Signed)
Informed pt .

## 2014-08-02 NOTE — Telephone Encounter (Signed)
Refill printed and signed and provided to refill nurse.  The MOM is OK for occasional use; a better over-the-counter choice might be Senokot-S.

## 2014-08-16 ENCOUNTER — Encounter (INDEPENDENT_AMBULATORY_CARE_PROVIDER_SITE_OTHER): Payer: Medicare Other | Admitting: Ophthalmology

## 2014-08-22 ENCOUNTER — Other Ambulatory Visit: Payer: Self-pay | Admitting: Internal Medicine

## 2014-08-28 ENCOUNTER — Encounter (INDEPENDENT_AMBULATORY_CARE_PROVIDER_SITE_OTHER): Payer: Medicare Other | Admitting: Ophthalmology

## 2014-08-29 ENCOUNTER — Other Ambulatory Visit: Payer: Self-pay | Admitting: *Deleted

## 2014-08-29 DIAGNOSIS — E1142 Type 2 diabetes mellitus with diabetic polyneuropathy: Secondary | ICD-10-CM

## 2014-08-30 ENCOUNTER — Encounter (INDEPENDENT_AMBULATORY_CARE_PROVIDER_SITE_OTHER): Payer: Medicare Other | Admitting: Ophthalmology

## 2014-08-30 DIAGNOSIS — H43813 Vitreous degeneration, bilateral: Secondary | ICD-10-CM

## 2014-08-30 DIAGNOSIS — E11311 Type 2 diabetes mellitus with unspecified diabetic retinopathy with macular edema: Secondary | ICD-10-CM | POA: Diagnosis not present

## 2014-08-30 DIAGNOSIS — I10 Essential (primary) hypertension: Secondary | ICD-10-CM | POA: Diagnosis not present

## 2014-08-30 DIAGNOSIS — E11351 Type 2 diabetes mellitus with proliferative diabetic retinopathy with macular edema: Secondary | ICD-10-CM | POA: Diagnosis not present

## 2014-08-30 DIAGNOSIS — E11331 Type 2 diabetes mellitus with moderate nonproliferative diabetic retinopathy with macular edema: Secondary | ICD-10-CM

## 2014-08-30 DIAGNOSIS — H35033 Hypertensive retinopathy, bilateral: Secondary | ICD-10-CM | POA: Diagnosis not present

## 2014-08-30 MED ORDER — HYDROCODONE-ACETAMINOPHEN 5-325 MG PO TABS: ORAL_TABLET | ORAL | Status: DC

## 2014-08-30 NOTE — Telephone Encounter (Signed)
Pt informed ready 

## 2014-08-30 NOTE — Telephone Encounter (Signed)
Refill printed and signed and provided to refill nurse. 

## 2014-09-05 ENCOUNTER — Other Ambulatory Visit: Payer: Self-pay | Admitting: Internal Medicine

## 2014-09-05 ENCOUNTER — Telehealth: Payer: Self-pay | Admitting: Dietician

## 2014-09-05 NOTE — Telephone Encounter (Signed)
Patient had called earlier to schedule appointment with CDE and asked about his [employer] prescription., CDE told patient is appeared to have been faxed to [facility] in January as it is in the medica section of his chart. He called [facility] today and they have no record of the prescription. CDE agreed to inform front office.  He also asked for promethezine rx, CDE informed patient that it is waiting on Dr. Marinda Elk to sign.  [name] also mentioned that  he has finished the Vitamin D and if Dr. Marinda Elk wants him to take more he will need to prescribe it.

## 2014-09-05 NOTE — Telephone Encounter (Signed)
Patient completed his course of 6 weekly doses of vitamin D.  Please tell [name] that I will recheck a vitamin D level at the next visit, and then decide if any further supplementation is needed.

## 2014-09-06 NOTE — Telephone Encounter (Signed)
Pt informed.[name] Hidden Cassady2/25/20161:53 PM

## 2014-09-10 ENCOUNTER — Other Ambulatory Visit: Payer: Self-pay | Admitting: Internal Medicine

## 2014-09-12 DIAGNOSIS — E08359 Diabetes mellitus due to underlying condition with proliferative diabetic retinopathy without macular edema: Secondary | ICD-10-CM | POA: Diagnosis not present

## 2014-09-12 DIAGNOSIS — H40053 Ocular hypertension, bilateral: Secondary | ICD-10-CM | POA: Diagnosis not present

## 2014-09-12 DIAGNOSIS — Z961 Presence of intraocular lens: Secondary | ICD-10-CM | POA: Diagnosis not present

## 2014-09-12 DIAGNOSIS — E11319 Type 2 diabetes mellitus with unspecified diabetic retinopathy without macular edema: Secondary | ICD-10-CM | POA: Diagnosis not present

## 2014-09-15 ENCOUNTER — Other Ambulatory Visit: Payer: Self-pay | Admitting: Internal Medicine

## 2014-09-19 ENCOUNTER — Encounter: Payer: Self-pay | Admitting: Dietician

## 2014-09-19 ENCOUNTER — Ambulatory Visit (INDEPENDENT_AMBULATORY_CARE_PROVIDER_SITE_OTHER): Payer: Medicare Other | Admitting: Internal Medicine

## 2014-09-19 ENCOUNTER — Ambulatory Visit (INDEPENDENT_AMBULATORY_CARE_PROVIDER_SITE_OTHER): Payer: Medicare Other | Admitting: Dietician

## 2014-09-19 ENCOUNTER — Encounter: Payer: Self-pay | Admitting: Internal Medicine

## 2014-09-19 VITALS — BP 162/74 | HR 71 | Temp 98.7°F | Wt >= 6400 oz

## 2014-09-19 DIAGNOSIS — N189 Chronic kidney disease, unspecified: Secondary | ICD-10-CM

## 2014-09-19 DIAGNOSIS — E1122 Type 2 diabetes mellitus with diabetic chronic kidney disease: Secondary | ICD-10-CM

## 2014-09-19 DIAGNOSIS — G4733 Obstructive sleep apnea (adult) (pediatric): Secondary | ICD-10-CM | POA: Diagnosis not present

## 2014-09-19 DIAGNOSIS — E1165 Type 2 diabetes mellitus with hyperglycemia: Secondary | ICD-10-CM

## 2014-09-19 DIAGNOSIS — E1140 Type 2 diabetes mellitus with diabetic neuropathy, unspecified: Secondary | ICD-10-CM

## 2014-09-19 DIAGNOSIS — E782 Mixed hyperlipidemia: Secondary | ICD-10-CM

## 2014-09-19 DIAGNOSIS — E559 Vitamin D deficiency, unspecified: Secondary | ICD-10-CM

## 2014-09-19 DIAGNOSIS — Z794 Long term (current) use of insulin: Secondary | ICD-10-CM

## 2014-09-19 DIAGNOSIS — IMO0002 Reserved for concepts with insufficient information to code with codable children: Secondary | ICD-10-CM

## 2014-09-19 DIAGNOSIS — D649 Anemia, unspecified: Secondary | ICD-10-CM

## 2014-09-19 DIAGNOSIS — E1142 Type 2 diabetes mellitus with diabetic polyneuropathy: Secondary | ICD-10-CM

## 2014-09-19 DIAGNOSIS — I129 Hypertensive chronic kidney disease with stage 1 through stage 4 chronic kidney disease, or unspecified chronic kidney disease: Secondary | ICD-10-CM | POA: Diagnosis not present

## 2014-09-19 DIAGNOSIS — E785 Hyperlipidemia, unspecified: Secondary | ICD-10-CM | POA: Diagnosis not present

## 2014-09-19 DIAGNOSIS — D6489 Other specified anemias: Secondary | ICD-10-CM

## 2014-09-19 DIAGNOSIS — E1169 Type 2 diabetes mellitus with other specified complication: Secondary | ICD-10-CM

## 2014-09-19 DIAGNOSIS — Z9989 Dependence on other enabling machines and devices: Secondary | ICD-10-CM

## 2014-09-19 DIAGNOSIS — I10 Essential (primary) hypertension: Secondary | ICD-10-CM

## 2014-09-19 LAB — COMPLETE METABOLIC PANEL WITH GFR
ALT: 20 U/L (ref 0–53)
AST: 16 U/L (ref 0–37)
Albumin: 3.6 g/dL (ref 3.5–5.2)
Alkaline Phosphatase: 101 U/L (ref 39–117)
BUN: 20 mg/dL (ref 6–23)
CALCIUM: 8.8 mg/dL (ref 8.4–10.5)
CO2: 24 mEq/L (ref 19–32)
Chloride: 104 mEq/L (ref 96–112)
Creat: 1.44 mg/dL — ABNORMAL HIGH (ref 0.50–1.35)
GFR, Est African American: 59 mL/min — ABNORMAL LOW
GFR, Est Non African American: 51 mL/min — ABNORMAL LOW
GLUCOSE: 265 mg/dL — AB (ref 70–99)
Potassium: 4.7 mEq/L (ref 3.5–5.3)
Sodium: 138 mEq/L (ref 135–145)
TOTAL PROTEIN: 6.5 g/dL (ref 6.0–8.3)
Total Bilirubin: 0.4 mg/dL (ref 0.2–1.2)

## 2014-09-19 LAB — CBC WITH DIFFERENTIAL/PLATELET
Basophils Absolute: 0 10*3/uL (ref 0.0–0.1)
Basophils Relative: 0 % (ref 0–1)
EOS ABS: 0.1 10*3/uL (ref 0.0–0.7)
Eosinophils Relative: 2 % (ref 0–5)
HCT: 38.1 % — ABNORMAL LOW (ref 39.0–52.0)
HEMOGLOBIN: 11.8 g/dL — AB (ref 13.0–17.0)
LYMPHS ABS: 1.5 10*3/uL (ref 0.7–4.0)
Lymphocytes Relative: 31 % (ref 12–46)
MCH: 27.6 pg (ref 26.0–34.0)
MCHC: 31 g/dL (ref 30.0–36.0)
MCV: 89.2 fL (ref 78.0–100.0)
MPV: 11.8 fL (ref 8.6–12.4)
Monocytes Absolute: 0.5 10*3/uL (ref 0.1–1.0)
Monocytes Relative: 10 % (ref 3–12)
NEUTROS ABS: 2.8 10*3/uL (ref 1.7–7.7)
Neutrophils Relative %: 57 % (ref 43–77)
PLATELETS: 151 10*3/uL (ref 150–400)
RBC: 4.27 MIL/uL (ref 4.22–5.81)
RDW: 15.1 % (ref 11.5–15.5)
WBC: 4.9 10*3/uL (ref 4.0–10.5)

## 2014-09-19 LAB — POCT GLYCOSYLATED HEMOGLOBIN (HGB A1C): Hemoglobin A1C: 8.2

## 2014-09-19 LAB — IRON: IRON: 84 ug/dL (ref 42–165)

## 2014-09-19 LAB — FERRITIN: Ferritin: 281 ng/mL (ref 22–322)

## 2014-09-19 LAB — GLUCOSE, CAPILLARY: GLUCOSE-CAPILLARY: 243 mg/dL — AB (ref 70–99)

## 2014-09-19 MED ORDER — ATORVASTATIN CALCIUM 20 MG PO TABS: 20.0000 mg | ORAL_TABLET | Freq: Every day | ORAL | Status: DC

## 2014-09-19 MED ORDER — INSULIN GLARGINE 100 UNIT/ML SOLOSTAR PEN: 72.0000 [IU] | PEN_INJECTOR | Freq: Two times a day (BID) | SUBCUTANEOUS | Status: DC

## 2014-09-19 MED ORDER — ENALAPRIL MALEATE 20 MG PO TABS: 40.0000 mg | ORAL_TABLET | Freq: Every day | ORAL | Status: DC

## 2014-09-19 MED ORDER — PROMETHAZINE HCL 25 MG PO TABS: 12.5000 mg | ORAL_TABLET | Freq: Every day | ORAL | Status: DC | PRN

## 2014-09-19 MED ORDER — INSULIN PEN NEEDLE 31G X 5 MM MISC: Status: DC

## 2014-09-19 MED ORDER — FUROSEMIDE 20 MG PO TABS: 40.0000 mg | ORAL_TABLET | Freq: Two times a day (BID) | ORAL | Status: DC

## 2014-09-19 MED ORDER — INSULIN LISPRO 100 UNIT/ML (KWIKPEN): PEN_INJECTOR | SUBCUTANEOUS | Status: DC

## 2014-09-19 NOTE — Assessment & Plan Note (Addendum)
BP Readings from Last 3 Encounters:  09/19/14 162/74  07/18/14 136/66  06/11/14 144/73    Lab Results  Component Value Date   NA 137 07/18/2014   K 4.4 07/18/2014   CREATININE 1.34 07/18/2014    Assessment: Blood pressure control: moderately elevated Progress toward BP goal:  deteriorated Comments: Blood pressure is above goal today, and patient has 1+ bilateral pitting leg edema on exam.  Current regimen is enalapril 40 mg daily and furosemide 40 mg each morning and 20 mg each evening.   Plan: Medications:  Continue enalapril 40 mg daily; increase furosemide dose to 40 mg 2 times a day.  Educational resources provided: brochure Self management tools provided: home blood pressure logbook Other plans: Will have patient return in 2 weeks to reassess blood pressure, volume status, and check basic metabolic panel.

## 2014-09-19 NOTE — Progress Notes (Signed)
   Subjective:    Patient ID: [name], male    DOB: 05-28-1951, 64 y.o.   MRN: [id number]  HPI Patient returns for management of his type 2 diabetes mellitus, hypertension, hyperlipidemia, and other chronic medical problems.  Today he has no acute complaints.  He reports that he has been compliant with his medications; however, his transportation came early this morning and he did not take his medications this morning before coming to clinic.  He reports no recent falls.  His home glucose meter download shows values over the past month which have ranged from 59 to 422, with an average value 198; he has had 3 lows, all occurring in the afternoon.  He reports that he sometimes eats less for lunch.  He reports that he has cut back some on his beer intake, but still drinks about two to three 16 ounce beers per day on average.   Review of Systems  Constitutional: Negative for fever, chills and diaphoresis.  Eyes: Negative for visual disturbance.  Respiratory: Positive for shortness of breath (Occasional mild exertional dyspnea).   Cardiovascular: Negative for chest pain and leg swelling.  Gastrointestinal: Negative for nausea, vomiting, abdominal pain, blood in stool and anal bleeding.  Genitourinary: Negative for dysuria and frequency.  Neurological: Negative for syncope.    I reviewed and updated the medication list, allergies, past medical history, past surgical history, family history, and social history.     Objective:   Physical Exam  Constitutional: No distress.  Cardiovascular: Normal rate, regular rhythm and normal heart sounds.  Exam reveals no gallop and no friction rub.   No murmur heard. 1+ bilateral pitting leg edema  Pulmonary/Chest: Effort normal and breath sounds normal. No respiratory distress. He has no wheezes. He has no rales.  Abdominal: Soft. Bowel sounds are normal. He exhibits no distension. There is no tenderness. There is no rebound and no guarding.  Skin:            Assessment & Plan:

## 2014-09-19 NOTE — Assessment & Plan Note (Addendum)
Lab Results  Component Value Date   HGBA1C 8.2 09/19/2014   HGBA1C 8.3 07/18/2014   HGBA1C 8.4 03/28/2014     Assessment: Diabetes control: fair control Progress toward A1C goal:  unchanged Comments: Hemoglobin A1c remains above goal and without significant change on current regimen of Lantus insulin 70 units twice a day and Humalog Kwikpen insulin 50 units before breakfast, 35 units before lunch, and 55 units before dinner plus 1 unit for every 10 mg/dL above 100 mg/dL.  Patient has had a few afternoon low blood sugars, and reports that he eats less at lunch on some days.  Plan: Medications:  Increase Lantus to a dose of 72 units twice a day; decrease Humalog to a dose of 50 units before breakfast, 30 units before lunch, and 55 units before dinner plus 1 unit for every 10 mg/dL above 100 mg/dL.  as before, patient was advised to reduce his mealtime dose by 10 units on exercise days. Home glucose monitoring: Frequency: 4 times a day Timing: before meals, at bedtime Instruction/counseling given: discussed foot care Educational resources provided: brochure Self management tools provided: copy of home glucose meter download, home glucose logbook

## 2014-09-19 NOTE — Progress Notes (Signed)
Medical Nutrition Therapy:  Appt start time: 0915 end time:  0930. Last visit 07/2014 Assessment:  Primary concerns today: Weight management, Blood sugar control and Meal planning.   Observed needles being left on insulin pens and reused. This was causing problems with insulin release from this pens and one had broken.  One pen was not working. He reports  He is walking about 35 minutes twice daily now, but not sure how many days/week and weeks he has continued this activity. . A1c decreased. Reports that he is drinking less beer/ wine/liquor on weekend evenings.he is cutting back with eventual plan to stop drinking it altogether.  Weight increased ~2.6# since December.  Meter download: discussed with physician who adjusted insulin.   Food intake was discussed briefly with patient who says he is having a hard time not eating mac and cheese, drinks only diet drinks and has also been drinking grapefruit juice.   Progress Towards Goal(s):  No progress with weight, some progress with A1C.   Nutritional Diagnosis:   [name]-3.3 Overweight/obesity As related to poor food/alcohol choices, increased hunger& high insulin intake and decreased activity deteriorated As evidenced by his weight gain .    Intervention:  Nutrition support provided for continued healthier food/alcohol choices, activity, blood sugar control.  Reviewed meter download with patient and meter averages. Coordination of care- discussed plans for alcohol consumption on weekends with physician.     Monitoring/Evaluation:  Dietary intake, exercise, meter and blood sugars, and body weight in 1 month(s).Patient to call to make appointment

## 2014-09-19 NOTE — Assessment & Plan Note (Signed)
Assessment: Patient reports that he is using his CPAP regularly.    Plan: Continue CPAP.

## 2014-09-19 NOTE — Assessment & Plan Note (Signed)
CREAT  Date Value Ref Range Status  07/18/2014 1.34 0.50 - 1.35 mg/dL Final  05/30/2014 1.53* 0.50 - 1.35 mg/dL Final  04/27/2014 1.47* 0.50 - 1.35 mg/dL Final  03/28/2014 1.44* 0.50 - 1.35 mg/dL Final  01/24/2014 1.36* 0.50 - 1.35 mg/dL Final     Lab Results  Component Value Date   CREATININE 1.34 07/18/2014   CREATININE 1.53* 05/30/2014   CREATININE 1.47* 04/27/2014   CREATININE 1.44* 03/28/2014   CREATININE 1.36* 01/24/2014     Assessment: Patient has stable chronic kidney disease in the setting of type 2 diabetes mellitus and hypertension.  Plan: Check a metabolic panel today.

## 2014-09-19 NOTE — Assessment & Plan Note (Addendum)
Lipids:    Component Value Date/Time   CHOL 146 07/18/2014 0931   TRIG 291* 07/18/2014 0931   HDL 32* 07/18/2014 0931   LDLCALC 56 07/18/2014 0931   VLDL 58* 07/18/2014 0931   CHOLHDL 4.6 07/18/2014 0931    Assessment: LDL is at goal on atorvastatin 20 mg daily.  Triglycerides are elevated, likely due to poor control of diabetes.  Plan: Continue atorvastatin 20 mg daily.

## 2014-09-19 NOTE — Assessment & Plan Note (Signed)
HEMOGLOBIN  Date Value Ref Range Status  05/30/2014 12.0* 13.0 - 17.0 g/dL Final  01/24/2014 12.3* 13.0 - 17.0 g/dL Final  07/26/2013 11.8* 13.0 - 17.0 g/dL Final    Assessment: Patient has mild chronic anemia which is asymptomatic; this is likely anemia of chronic disease..   Plan: Check CBC with differential, ferritin, and iron studies.

## 2014-09-19 NOTE — Patient Instructions (Addendum)
You are doing a great job walking!!!  Here is the [facility] phone number if you want to call about your Humalog pen: [phone]  Please make an appointment in 6-8 weeks (end of April).

## 2014-09-19 NOTE — Assessment & Plan Note (Signed)
VIT D, 25-HYDROXY  Date Value Ref Range Status  07/18/2014 12* 30 - 100 ng/mL Final    Comment:    ** Please note change in reference range(s). ** Vitamin D Status           25-OH Vitamin D        Deficiency                <20 ng/mL        Insufficiency         20 - 29 ng/mL        Optimal             > or = 30 ng/mL   For 25-OH Vitamin D testing on patients on D2-supplementation and patients for whom quantitation of D2 and D3 fractions is required, the QuestAssureD 25-OH VIT D, (D2,D3), LC/MS/MS is recommended: order code [id number] (patients > 2 yrs).      Assessment: Patient's vitamin D level on 07/18/2014 remained low despite my having prescribed vitamin D 50,000 units once weekly for 6 weeks last year.  It is unclear whether patient filled the prior prescription.  I treated again in January with vitamin D 50,000 units once a week for 6 weeks, and patient reports that he completed the course of oral vitamin D.  Plan: Will check a vitamin D level today.

## 2014-09-19 NOTE — Patient Instructions (Addendum)
Reduce Humalog quick pen insulin dose to 50 units before breakfast, 30 units before lunch, and 55 units before dinner, plus 1 unit for every 10 mg/dl above 100 mg/dl.  Reduce each mealtime dose by 10 units on exercise days. Change Lantus insulin dose to 72 units twice a day. Increase furosemide 20 mg to a dose of 2 tablets each morning and 2 tablets each evening.

## 2014-09-20 ENCOUNTER — Other Ambulatory Visit: Payer: Self-pay | Admitting: Internal Medicine

## 2014-09-20 LAB — VITAMIN D 25 HYDROXY (VIT D DEFICIENCY, FRACTURES): Vit D, 25-Hydroxy: 13 ng/mL — ABNORMAL LOW (ref 30–100)

## 2014-09-21 ENCOUNTER — Telehealth: Payer: Self-pay | Admitting: Internal Medicine

## 2014-09-21 DIAGNOSIS — E559 Vitamin D deficiency, unspecified: Secondary | ICD-10-CM

## 2014-09-21 MED ORDER — VITAMIN D3 50 MCG (2000 UT) PO CAPS: 2000.0000 [IU] | ORAL_CAPSULE | Freq: Every day | ORAL | Status: DC

## 2014-09-21 NOTE — Progress Notes (Signed)
Quick Note:  Patient's vitamin D level remains low despite recent treatment with vitamin D 50,000 units once weekly for 6 weeks. I called patient and he confirmed that he completed the course of weekly vitamin D replacement. The plan is to start vitamin D3 2,000 units one capsule daily; I sent a prescription for a 3 month supply. Patient will then need to have vitamin D level rechecked. I gave these instructions to patient today by phone. ______

## 2014-09-21 NOTE — Assessment & Plan Note (Signed)
Telephone Contact Note  Lab Results  Component Value Date   VD25OH 13* 09/19/2014     Patient's vitamin D level remains low despite recent treatment with vitamin D 50,000 units once weekly for 6 weeks.  I called patient and he confirmed that he completed the course of weekly vitamin D replacement.  The plan is to start vitamin D3 2,000 units one capsule daily; I sent a prescription for a 3 month supply.  Patient will then need to have vitamin D level rechecked.  I gave these instructions to patient today by phone.

## 2014-09-21 NOTE — Telephone Encounter (Signed)
Lab Results  Component Value Date   VD25OH 13* 09/19/2014     Patient's vitamin D level remains low despite recent treatment with vitamin D 50,000 units once weekly for 6 weeks.  I called patient and he confirmed that he completed the course of weekly vitamin D replacement.  The plan is to start vitamin D3 2,000 units one capsule daily; I sent a prescription for a 3 month supply.  Patient will then need to have vitamin D level rechecked.  I gave these instructions to patient today by phone.

## 2014-09-26 ENCOUNTER — Encounter: Payer: Self-pay | Admitting: *Deleted

## 2014-09-26 ENCOUNTER — Other Ambulatory Visit: Payer: Self-pay | Admitting: *Deleted

## 2014-09-26 DIAGNOSIS — E1142 Type 2 diabetes mellitus with diabetic polyneuropathy: Secondary | ICD-10-CM

## 2014-09-27 ENCOUNTER — Other Ambulatory Visit: Payer: Self-pay | Admitting: Internal Medicine

## 2014-09-27 ENCOUNTER — Encounter (INDEPENDENT_AMBULATORY_CARE_PROVIDER_SITE_OTHER): Payer: Medicare Other | Admitting: Ophthalmology

## 2014-09-27 DIAGNOSIS — E11331 Type 2 diabetes mellitus with moderate nonproliferative diabetic retinopathy with macular edema: Secondary | ICD-10-CM | POA: Diagnosis not present

## 2014-09-27 DIAGNOSIS — I10 Essential (primary) hypertension: Secondary | ICD-10-CM

## 2014-09-27 DIAGNOSIS — E11311 Type 2 diabetes mellitus with unspecified diabetic retinopathy with macular edema: Secondary | ICD-10-CM | POA: Diagnosis not present

## 2014-09-27 DIAGNOSIS — H35033 Hypertensive retinopathy, bilateral: Secondary | ICD-10-CM | POA: Diagnosis not present

## 2014-09-27 DIAGNOSIS — H43813 Vitreous degeneration, bilateral: Secondary | ICD-10-CM | POA: Diagnosis not present

## 2014-09-27 DIAGNOSIS — E11351 Type 2 diabetes mellitus with proliferative diabetic retinopathy with macular edema: Secondary | ICD-10-CM

## 2014-09-27 LAB — HM DIABETES EYE EXAM

## 2014-09-27 MED ORDER — HYDROCODONE-ACETAMINOPHEN 5-325 MG PO TABS: ORAL_TABLET | ORAL | Status: DC

## 2014-09-27 MED ORDER — ENALAPRIL MALEATE 20 MG PO TABS: 40.0000 mg | ORAL_TABLET | Freq: Every day | ORAL | Status: DC

## 2014-09-27 NOTE — Telephone Encounter (Signed)
Pt informed

## 2014-09-27 NOTE — Telephone Encounter (Signed)
Refills for hydrocodone 5 mg - acetaminophen 325 mg, take 1 and 1/2 tablets Q6h as needed for pain, printed and signed and provided to refill nurse as follows:  #[id number], to be filled no earlier than 10/01/2014, to be provided to patient #[id number], to be filled no earlier than 10/31/2014, to be held on file #[id number], to be filled no earlier than 11/30/2014, to be held on file

## 2014-10-01 ENCOUNTER — Encounter: Payer: Self-pay | Admitting: *Deleted

## 2014-10-08 ENCOUNTER — Ambulatory Visit (INDEPENDENT_AMBULATORY_CARE_PROVIDER_SITE_OTHER): Payer: Medicare Other | Admitting: Internal Medicine

## 2014-10-08 ENCOUNTER — Encounter: Payer: Self-pay | Admitting: Internal Medicine

## 2014-10-08 ENCOUNTER — Ambulatory Visit (INDEPENDENT_AMBULATORY_CARE_PROVIDER_SITE_OTHER): Payer: Medicare Other | Admitting: Dietician

## 2014-10-08 ENCOUNTER — Encounter: Payer: Self-pay | Admitting: Dietician

## 2014-10-08 VITALS — Wt >= 6400 oz

## 2014-10-08 VITALS — BP 124/67 | HR 74 | Temp 97.9°F | Wt >= 6400 oz

## 2014-10-08 DIAGNOSIS — Z713 Dietary counseling and surveillance: Secondary | ICD-10-CM | POA: Diagnosis not present

## 2014-10-08 DIAGNOSIS — N189 Chronic kidney disease, unspecified: Secondary | ICD-10-CM

## 2014-10-08 DIAGNOSIS — IMO0002 Reserved for concepts with insufficient information to code with codable children: Secondary | ICD-10-CM

## 2014-10-08 DIAGNOSIS — I10 Essential (primary) hypertension: Secondary | ICD-10-CM | POA: Diagnosis not present

## 2014-10-08 DIAGNOSIS — E1165 Type 2 diabetes mellitus with hyperglycemia: Secondary | ICD-10-CM | POA: Diagnosis not present

## 2014-10-08 LAB — BASIC METABOLIC PANEL WITH GFR
BUN: 18 mg/dL (ref 6–23)
CO2: 25 meq/L (ref 19–32)
Calcium: 8.7 mg/dL (ref 8.4–10.5)
Chloride: 104 mEq/L (ref 96–112)
Creat: 1.54 mg/dL — ABNORMAL HIGH (ref 0.50–1.35)
GFR, Est African American: 54 mL/min — ABNORMAL LOW
GFR, Est Non African American: 47 mL/min — ABNORMAL LOW
Glucose, Bld: 124 mg/dL — ABNORMAL HIGH (ref 70–99)
POTASSIUM: 4.2 meq/L (ref 3.5–5.3)
SODIUM: 139 meq/L (ref 135–145)

## 2014-10-08 NOTE — Progress Notes (Signed)
   Subjective:    Patient ID: [name], male    DOB: 09-10-1950, 64 y.o.   MRN: [id number]  HPI Patient returns for management of his hypertension, uncontrolled diabetes mellitus, chronic kidney disease, and other chronic medical problems.  He has no acute complaints today.  Following his last visit, he increased his furosemide to a dose of 40 mg twice a day, and reports improvement in his leg edema.  He reports that he has cut down his beer consumption substantially, and his blood glucose results over the past 2 weeks definitely look better.   Review of Systems  Constitutional: Negative for fever, chills and diaphoresis.  Respiratory: Negative for shortness of breath.   Cardiovascular: Positive for leg swelling (Improved). Negative for chest pain.  Gastrointestinal: Negative for nausea, vomiting, abdominal pain and blood in stool.  Genitourinary: Negative for dysuria.       Objective:   Physical Exam  Constitutional: No distress.  Cardiovascular: Normal rate, regular rhythm and normal heart sounds.  Exam reveals no gallop and no friction rub.   No murmur heard. Trace bilateral ankle edema  Pulmonary/Chest: Effort normal and breath sounds normal. No respiratory distress. He has no wheezes. He has no rales.  Abdominal: Soft. Bowel sounds are normal. He exhibits no distension. There is no tenderness. There is no rebound and no guarding.  Skin:           Assessment & Plan:

## 2014-10-08 NOTE — Progress Notes (Signed)
Medical Nutrition Therapy:  Appt start time: 0945 end time:  1000. Last visit 09/19/2014 Assessment:  Primary concerns today: Weight management, Blood sugar control and Meal planning.   He is walking about 35 minutes twice daily most days of the week. A1c decreased.  Weight in.  Meter download: discussed with physician and patient: no low blood sugars in past 2 weeks, average and fastings decreased.    Food intake was discussed briefly with patient: beer decreased to one in past 2 weeks, drinks only diet drinks and water, occassional juice   Progress Towards Goal(s):  No progress with weight, some progress with A1C.   Nutritional Diagnosis:   Rodman-3.3 Overweight/obesity As related to poor food/alcohol choices, increased hunger& high insulin intake and decreased activity improving As evidenced by his weight loss.    Intervention:  Nutrition support provided for continued healthier food/alcohol choices, activity, blood sugar control.  Reviewed meter download with patient and meter averages. Coordination of care- consider  addition of incretins to assist with  appetite suppression/ decreased insulin doses. Also could consider accu check aviva expert meter, but not sure patient is willing to carb count to be able to use it.  Monitoring/Evaluation:  Dietary intake, exercise, meter and blood sugars, and body weight in 1 month(s).

## 2014-10-08 NOTE — Patient Instructions (Signed)
A referral has been made to endocrinology for management of your diabetes.

## 2014-10-08 NOTE — Assessment & Plan Note (Signed)
BP Readings from Last 3 Encounters:  10/08/14 124/67  09/19/14 162/74  07/18/14 136/66    Lab Results  Component Value Date   NA 139 10/08/2014   K 4.2 10/08/2014   CREATININE 1.54* 10/08/2014    Assessment: Blood pressure control: controlled Progress toward BP goal:  at goal Comments: Blood pressure is well controlled on enalapril 40 mg daily and furosemide 40 mg twice a day.  Plan: Medications:  continue current medications Other plans: Check a basic metabolic panel today

## 2014-10-08 NOTE — Assessment & Plan Note (Signed)
Lab Results  Component Value Date   HGBA1C 8.2 09/19/2014   HGBA1C 8.3 07/18/2014   HGBA1C 8.4 03/28/2014     Assessment: Diabetes control: fair control Progress toward A1C goal:  unchanged Comments: Diabetes is not well controlled, but his blood sugars show some improvement over the past 2 weeks since he reduced his beer consumption substantially.  He is currently on Lantus 72 units twice a day and Humalog 50 units before breakfast, 30 units before lunch, and 55 units before dinner plus 1 unit for every 10 mg/dL above 100 mg/dL.  I referred patient in the past to endocrinology in hopes of getting better control of his diabetes, and he was followed by Dr. Buddy Duty in the past but did not follow up with Dr. Buddy Duty.   Plan: Medications:  continue current medications; refer again to endocrinology for management of his diabetes Home glucose monitoring: Frequency: 4 times a day Timing: before meals, at bedtime Instruction/counseling given: discussed foot care

## 2014-10-08 NOTE — Assessment & Plan Note (Signed)
  Lab Results  Component Value Date   CREATININE 1.54* 10/08/2014   CREATININE 1.44* 09/19/2014   CREATININE 1.34 07/18/2014   CREATININE 1.53* 05/30/2014   CREATININE 1.47* 04/27/2014     Assessment: Patient has stable chronic kidney disease in the setting of type 2 diabetes mellitus and hypertension.  Plan: Check a metabolic panel today.

## 2014-10-25 ENCOUNTER — Encounter (INDEPENDENT_AMBULATORY_CARE_PROVIDER_SITE_OTHER): Payer: Medicare Other | Admitting: Ophthalmology

## 2014-10-31 ENCOUNTER — Telehealth: Payer: Self-pay | Admitting: Dietician

## 2014-10-31 NOTE — Telephone Encounter (Signed)
Mr. Waas called back and said Dr Buddy Duty stopped seeing him because of an outstanding bill. He also says he does not have the money to pay Dr. Cindra Eves fees.  He also asked me for assistance with solving his problem with [facility]: They are refusing to send his CPAP supplies this month unless he gives then 200-300 $. He had set up a payment plan with them of 13.00$ per month, but now are requiring additiona payment. He says he does not have the $ to pay them and needs his c-pap supplies to rest at night.  CDE spoke to CSW to see if that is something she can help him with.  She recommended he call [facility] and speak to them. Patient notified.

## 2014-10-31 NOTE — Telephone Encounter (Addendum)
Asked to be sure patient has and appointment with endocrinologist Dr. Buddy Duty and that patient knows about it.  No action on referral noted as yet.

## 2014-11-16 ENCOUNTER — Other Ambulatory Visit: Payer: Self-pay | Admitting: Internal Medicine

## 2014-11-16 DIAGNOSIS — G4733 Obstructive sleep apnea (adult) (pediatric): Secondary | ICD-10-CM

## 2014-11-20 ENCOUNTER — Other Ambulatory Visit: Payer: Self-pay | Admitting: Internal Medicine

## 2014-11-29 ENCOUNTER — Other Ambulatory Visit: Payer: Self-pay | Admitting: Internal Medicine

## 2014-11-29 DIAGNOSIS — G4733 Obstructive sleep apnea (adult) (pediatric): Secondary | ICD-10-CM

## 2014-12-12 ENCOUNTER — Ambulatory Visit: Payer: Medicare Other | Admitting: Pulmonary Disease

## 2014-12-13 ENCOUNTER — Inpatient Hospital Stay (HOSPITAL_COMMUNITY): Payer: Medicare Other

## 2014-12-13 ENCOUNTER — Inpatient Hospital Stay (HOSPITAL_COMMUNITY)
Admission: AD | Admit: 2014-12-13 | Discharge: 2014-12-16 | DRG: 617 | Disposition: A | Discharge status: Home / Self Care | Payer: Medicare Other | Source: Ambulatory Visit | Attending: Internal Medicine | Admitting: Internal Medicine

## 2014-12-13 ENCOUNTER — Other Ambulatory Visit: Payer: Self-pay

## 2014-12-13 ENCOUNTER — Encounter: Payer: Self-pay | Admitting: Internal Medicine

## 2014-12-13 ENCOUNTER — Ambulatory Visit (INDEPENDENT_AMBULATORY_CARE_PROVIDER_SITE_OTHER): Payer: Medicare Other | Admitting: Internal Medicine

## 2014-12-13 ENCOUNTER — Encounter (HOSPITAL_COMMUNITY): Payer: Self-pay | Admitting: General Practice

## 2014-12-13 VITALS — BP 146/76 | HR 77 | Temp 98.4°F | Ht 78.0 in | Wt >= 6400 oz

## 2014-12-13 DIAGNOSIS — Z89422 Acquired absence of other left toe(s): Secondary | ICD-10-CM

## 2014-12-13 DIAGNOSIS — G4733 Obstructive sleep apnea (adult) (pediatric): Secondary | ICD-10-CM | POA: Diagnosis present

## 2014-12-13 DIAGNOSIS — Z794 Long term (current) use of insulin: Secondary | ICD-10-CM

## 2014-12-13 DIAGNOSIS — E11311 Type 2 diabetes mellitus with unspecified diabetic retinopathy with macular edema: Secondary | ICD-10-CM | POA: Diagnosis present

## 2014-12-13 DIAGNOSIS — Z79899 Other long term (current) drug therapy: Secondary | ICD-10-CM

## 2014-12-13 DIAGNOSIS — Z7982 Long term (current) use of aspirin: Secondary | ICD-10-CM | POA: Diagnosis not present

## 2014-12-13 DIAGNOSIS — I129 Hypertensive chronic kidney disease with stage 1 through stage 4 chronic kidney disease, or unspecified chronic kidney disease: Secondary | ICD-10-CM | POA: Diagnosis present

## 2014-12-13 DIAGNOSIS — E785 Hyperlipidemia, unspecified: Secondary | ICD-10-CM | POA: Diagnosis present

## 2014-12-13 DIAGNOSIS — E1165 Type 2 diabetes mellitus with hyperglycemia: Secondary | ICD-10-CM | POA: Diagnosis present

## 2014-12-13 DIAGNOSIS — M7989 Other specified soft tissue disorders: Secondary | ICD-10-CM | POA: Insufficient documentation

## 2014-12-13 DIAGNOSIS — Z881 Allergy status to other antibiotic agents status: Secondary | ICD-10-CM | POA: Diagnosis not present

## 2014-12-13 DIAGNOSIS — E11621 Type 2 diabetes mellitus with foot ulcer: Secondary | ICD-10-CM | POA: Diagnosis present

## 2014-12-13 DIAGNOSIS — M86172 Other acute osteomyelitis, left ankle and foot: Secondary | ICD-10-CM | POA: Diagnosis present

## 2014-12-13 DIAGNOSIS — M1990 Unspecified osteoarthritis, unspecified site: Secondary | ICD-10-CM | POA: Diagnosis present

## 2014-12-13 DIAGNOSIS — IMO0002 Reserved for concepts with insufficient information to code with codable children: Secondary | ICD-10-CM | POA: Diagnosis present

## 2014-12-13 DIAGNOSIS — B9689 Other specified bacterial agents as the cause of diseases classified elsewhere: Secondary | ICD-10-CM | POA: Diagnosis not present

## 2014-12-13 DIAGNOSIS — Z87891 Personal history of nicotine dependence: Secondary | ICD-10-CM | POA: Diagnosis not present

## 2014-12-13 DIAGNOSIS — L03116 Cellulitis of left lower limb: Secondary | ICD-10-CM | POA: Diagnosis present

## 2014-12-13 DIAGNOSIS — I4891 Unspecified atrial fibrillation: Secondary | ICD-10-CM | POA: Diagnosis not present

## 2014-12-13 DIAGNOSIS — Z6841 Body Mass Index (BMI) 40.0 and over, adult: Secondary | ICD-10-CM | POA: Diagnosis not present

## 2014-12-13 DIAGNOSIS — N183 Chronic kidney disease, stage 3 unspecified: Secondary | ICD-10-CM | POA: Diagnosis present

## 2014-12-13 DIAGNOSIS — Z8249 Family history of ischemic heart disease and other diseases of the circulatory system: Secondary | ICD-10-CM

## 2014-12-13 DIAGNOSIS — I480 Paroxysmal atrial fibrillation: Secondary | ICD-10-CM | POA: Diagnosis present

## 2014-12-13 DIAGNOSIS — E1122 Type 2 diabetes mellitus with diabetic chronic kidney disease: Secondary | ICD-10-CM | POA: Diagnosis present

## 2014-12-13 DIAGNOSIS — Z833 Family history of diabetes mellitus: Secondary | ICD-10-CM

## 2014-12-13 DIAGNOSIS — I2510 Atherosclerotic heart disease of native coronary artery without angina pectoris: Secondary | ICD-10-CM | POA: Diagnosis present

## 2014-12-13 DIAGNOSIS — E1142 Type 2 diabetes mellitus with diabetic polyneuropathy: Secondary | ICD-10-CM | POA: Diagnosis present

## 2014-12-13 DIAGNOSIS — K219 Gastro-esophageal reflux disease without esophagitis: Secondary | ICD-10-CM | POA: Diagnosis present

## 2014-12-13 DIAGNOSIS — L089 Local infection of the skin and subcutaneous tissue, unspecified: Secondary | ICD-10-CM

## 2014-12-13 DIAGNOSIS — L97529 Non-pressure chronic ulcer of other part of left foot with unspecified severity: Secondary | ICD-10-CM | POA: Diagnosis present

## 2014-12-13 DIAGNOSIS — J45909 Unspecified asthma, uncomplicated: Secondary | ICD-10-CM | POA: Diagnosis present

## 2014-12-13 DIAGNOSIS — E1169 Type 2 diabetes mellitus with other specified complication: Secondary | ICD-10-CM | POA: Diagnosis not present

## 2014-12-13 DIAGNOSIS — E11628 Type 2 diabetes mellitus with other skin complications: Secondary | ICD-10-CM

## 2014-12-13 DIAGNOSIS — I10 Essential (primary) hypertension: Secondary | ICD-10-CM | POA: Diagnosis present

## 2014-12-13 HISTORY — DX: Essential (primary) hypertension: I10

## 2014-12-13 HISTORY — DX: Personal history of other medical treatment: Z92.89

## 2014-12-13 HISTORY — DX: Chronic kidney disease, stage 3 unspecified: N18.30

## 2014-12-13 HISTORY — DX: Obstructive sleep apnea (adult) (pediatric): G47.33

## 2014-12-13 HISTORY — DX: Morbid (severe) obesity due to excess calories: E66.01

## 2014-12-13 HISTORY — DX: Type 2 diabetes mellitus without complications: E11.9

## 2014-12-13 HISTORY — DX: Dependence on other enabling machines and devices: Z99.89

## 2014-12-13 HISTORY — DX: Chronic kidney disease, stage 3 (moderate): N18.3

## 2014-12-13 HISTORY — DX: Unspecified asthma, uncomplicated: J45.909

## 2014-12-13 HISTORY — DX: Unspecified osteoarthritis, unspecified site: M19.90

## 2014-12-13 LAB — URINALYSIS, ROUTINE W REFLEX MICROSCOPIC
Bilirubin Urine: NEGATIVE
Glucose, UA: NEGATIVE mg/dL
Ketones, ur: NEGATIVE mg/dL
Leukocytes, UA: NEGATIVE
NITRITE: NEGATIVE
Protein, ur: NEGATIVE mg/dL
Specific Gravity, Urine: 1.011 (ref 1.005–1.030)
UROBILINOGEN UA: 1 mg/dL (ref 0.0–1.0)
pH: 6 (ref 5.0–8.0)

## 2014-12-13 LAB — RAPID URINE DRUG SCREEN, HOSP PERFORMED
AMPHETAMINES: NOT DETECTED
BARBITURATES: NOT DETECTED
BENZODIAZEPINES: NOT DETECTED
Cocaine: NOT DETECTED
Opiates: NOT DETECTED
TETRAHYDROCANNABINOL: NOT DETECTED

## 2014-12-13 LAB — CBC WITH DIFFERENTIAL/PLATELET
BASOS PCT: 1 % (ref 0–1)
Basophils Absolute: 0 10*3/uL (ref 0.0–0.1)
EOS ABS: 0.2 10*3/uL (ref 0.0–0.7)
EOS PCT: 3 % (ref 0–5)
HCT: 38.1 % — ABNORMAL LOW (ref 39.0–52.0)
Hemoglobin: 12.1 g/dL — ABNORMAL LOW (ref 13.0–17.0)
LYMPHS ABS: 1.9 10*3/uL (ref 0.7–4.0)
Lymphocytes Relative: 32 % (ref 12–46)
MCH: 28.3 pg (ref 26.0–34.0)
MCHC: 31.8 g/dL (ref 30.0–36.0)
MCV: 89 fL (ref 78.0–100.0)
MONO ABS: 0.5 10*3/uL (ref 0.1–1.0)
Monocytes Relative: 9 % (ref 3–12)
NEUTROS PCT: 55 % (ref 43–77)
Neutro Abs: 3.4 10*3/uL (ref 1.7–7.7)
Platelets: 205 10*3/uL (ref 150–400)
RBC: 4.28 MIL/uL (ref 4.22–5.81)
RDW: 14.7 % (ref 11.5–15.5)
WBC: 6 10*3/uL (ref 4.0–10.5)

## 2014-12-13 LAB — PROTIME-INR
INR: 1.07 (ref 0.00–1.49)
Prothrombin Time: 14.1 seconds (ref 11.6–15.2)

## 2014-12-13 LAB — BASIC METABOLIC PANEL
Anion gap: 9 (ref 5–15)
BUN: 17 mg/dL (ref 6–20)
CALCIUM: 9.2 mg/dL (ref 8.9–10.3)
CO2: 26 mmol/L (ref 22–32)
CREATININE: 1.75 mg/dL — AB (ref 0.61–1.24)
Chloride: 103 mmol/L (ref 101–111)
GFR calc Af Amer: 46 mL/min — ABNORMAL LOW (ref >60)
GFR calc non Af Amer: 39 mL/min — ABNORMAL LOW (ref >60)
Glucose, Bld: 210 mg/dL — ABNORMAL HIGH (ref 65–99)
POTASSIUM: 4.8 mmol/L (ref 3.5–5.1)
Sodium: 138 mmol/L (ref 135–145)

## 2014-12-13 LAB — PHOSPHORUS: Phosphorus: 3.4 mg/dL (ref 2.5–4.6)

## 2014-12-13 LAB — APTT: aPTT: 32 seconds (ref 24–37)

## 2014-12-13 LAB — GLUCOSE, CAPILLARY
GLUCOSE-CAPILLARY: 140 mg/dL — AB (ref 65–99)
GLUCOSE-CAPILLARY: 161 mg/dL — AB (ref 65–99)
Glucose-Capillary: 142 mg/dL — ABNORMAL HIGH (ref 65–99)

## 2014-12-13 LAB — C-REACTIVE PROTEIN: CRP: 4.7 mg/dL — ABNORMAL HIGH (ref <1.0)

## 2014-12-13 LAB — TSH: TSH: 4.979 u[IU]/mL — ABNORMAL HIGH (ref 0.350–4.500)

## 2014-12-13 LAB — MAGNESIUM: Magnesium: 2 mg/dL (ref 1.7–2.4)

## 2014-12-13 LAB — URINE MICROSCOPIC-ADD ON

## 2014-12-13 LAB — POCT GLYCOSYLATED HEMOGLOBIN (HGB A1C): HEMOGLOBIN A1C: 8.4

## 2014-12-13 LAB — SEDIMENTATION RATE: Sed Rate: 64 mm/hr — ABNORMAL HIGH (ref 0–16)

## 2014-12-13 MED ORDER — INSULIN GLARGINE 100 UNIT/ML ~~LOC~~ SOLN
72.0000 [IU] | Freq: Two times a day (BID) | SUBCUTANEOUS | Status: DC
  Administered 2014-12-13 – 2014-12-16 (×6): 72 [IU] via SUBCUTANEOUS
  Filled 2014-12-13 (×9): qty 0.72

## 2014-12-13 MED ORDER — VITAMIN D3 25 MCG (1000 UNIT) PO TABS
2000.0000 [IU] | ORAL_TABLET | Freq: Every day | ORAL | Status: DC
  Administered 2014-12-14 – 2014-12-16 (×3): 2000 [IU] via ORAL
  Filled 2014-12-13 (×3): qty 2

## 2014-12-13 MED ORDER — HEPARIN BOLUS VIA INFUSION
4000.0000 [IU] | Freq: Once | INTRAVENOUS | Status: AC
  Administered 2014-12-13: 4000 [IU] via INTRAVENOUS
  Filled 2014-12-13: qty 4000

## 2014-12-13 MED ORDER — PANTOPRAZOLE SODIUM 40 MG PO TBEC
40.0000 mg | DELAYED_RELEASE_TABLET | Freq: Every day | ORAL | Status: DC
  Administered 2014-12-14 – 2014-12-16 (×3): 40 mg via ORAL
  Filled 2014-12-13 (×2): qty 1

## 2014-12-13 MED ORDER — ALBUTEROL SULFATE (2.5 MG/3ML) 0.083% IN NEBU: 3.0000 mL | INHALATION_SOLUTION | Freq: Four times a day (QID) | RESPIRATORY_TRACT | Status: DC | PRN

## 2014-12-13 MED ORDER — LATANOPROST 0.005 % OP SOLN
1.0000 [drp] | Freq: Every day | OPHTHALMIC | Status: DC
  Administered 2014-12-13 – 2014-12-15 (×3): 1 [drp] via OPHTHALMIC
  Filled 2014-12-13 (×2): qty 2.5

## 2014-12-13 MED ORDER — HEPARIN (PORCINE) IN NACL 100-0.45 UNIT/ML-% IJ SOLN
2000.0000 [IU]/h | INTRAMUSCULAR | Status: AC
  Administered 2014-12-13 – 2014-12-16 (×5): 2000 [IU]/h via INTRAVENOUS
  Filled 2014-12-13 (×9): qty 250

## 2014-12-13 MED ORDER — HEPARIN SODIUM (PORCINE) 5000 UNIT/ML IJ SOLN: 5000.0000 [IU] | Freq: Three times a day (TID) | INTRAMUSCULAR | Status: DC

## 2014-12-13 MED ORDER — ASPIRIN EC 81 MG PO TBEC
81.0000 mg | DELAYED_RELEASE_TABLET | Freq: Every day | ORAL | Status: DC
  Administered 2014-12-14 – 2014-12-16 (×3): 81 mg via ORAL
  Filled 2014-12-13 (×3): qty 1

## 2014-12-13 MED ORDER — FUROSEMIDE 40 MG PO TABS
40.0000 mg | ORAL_TABLET | Freq: Two times a day (BID) | ORAL | Status: DC
  Administered 2014-12-13 – 2014-12-16 (×6): 40 mg via ORAL
  Filled 2014-12-13 (×8): qty 1

## 2014-12-13 MED ORDER — ACETAMINOPHEN 325 MG PO TABS
650.0000 mg | ORAL_TABLET | Freq: Four times a day (QID) | ORAL | Status: DC | PRN
  Administered 2014-12-14: 650 mg via ORAL
  Filled 2014-12-13: qty 2

## 2014-12-13 MED ORDER — ACETAMINOPHEN 650 MG RE SUPP: 650.0000 mg | Freq: Four times a day (QID) | RECTAL | Status: DC | PRN

## 2014-12-13 MED ORDER — METOPROLOL TARTRATE 25 MG PO TABS
25.0000 mg | ORAL_TABLET | Freq: Two times a day (BID) | ORAL | Status: DC
  Administered 2014-12-13 – 2014-12-16 (×6): 25 mg via ORAL
  Filled 2014-12-13 (×7): qty 1

## 2014-12-13 MED ORDER — VITAMIN D3 50 MCG (2000 UT) PO CAPS: 2000.0000 [IU] | ORAL_CAPSULE | Freq: Every day | ORAL | Status: DC

## 2014-12-13 MED ORDER — ENALAPRIL MALEATE 20 MG PO TABS
40.0000 mg | ORAL_TABLET | Freq: Every day | ORAL | Status: DC
  Administered 2014-12-14 – 2014-12-16 (×3): 40 mg via ORAL
  Filled 2014-12-13 (×3): qty 2

## 2014-12-13 MED ORDER — ATORVASTATIN CALCIUM 20 MG PO TABS
20.0000 mg | ORAL_TABLET | Freq: Every day | ORAL | Status: DC
  Administered 2014-12-13 – 2014-12-15 (×3): 20 mg via ORAL
  Filled 2014-12-13 (×4): qty 1

## 2014-12-13 MED ORDER — INSULIN ASPART 100 UNIT/ML ~~LOC~~ SOLN
0.0000 [IU] | Freq: Every day | SUBCUTANEOUS | Status: DC
  Administered 2014-12-13: 4 [IU] via SUBCUTANEOUS

## 2014-12-13 MED ORDER — INSULIN ASPART 100 UNIT/ML ~~LOC~~ SOLN
0.0000 [IU] | Freq: Three times a day (TID) | SUBCUTANEOUS | Status: DC
  Administered 2014-12-13: 3 [IU] via SUBCUTANEOUS
  Administered 2014-12-14: 4 [IU] via SUBCUTANEOUS
  Administered 2014-12-14 (×2): 7 [IU] via SUBCUTANEOUS
  Administered 2014-12-15 (×3): 3 [IU] via SUBCUTANEOUS
  Administered 2014-12-16: 7 [IU] via SUBCUTANEOUS

## 2014-12-13 NOTE — Progress Notes (Signed)
Pt placed on nocturnal CPAP.  Machine plugged into red outlet, humidifier filled with sterile water, and machine set on automode.  Pt comfortable and stable.

## 2014-12-13 NOTE — Progress Notes (Signed)
*  PRELIMINARY RESULTS* Vascular Ultrasound Left lower extremity venous duplex has been completed.  Preliminary findings: no evidence of DVT.  Landry Mellow, RDMS, RVT  12/13/2014, 6:08 PM

## 2014-12-13 NOTE — Progress Notes (Signed)
Medicine attending: I personally interviewed and briefly examined this patient and reviewed pertinent laboratory and radiographic data together with resident physician Dr.Kysean Alice Rieger and I concur with his evaluation and management plan. Longstanding diabetic with peripheral neuropathy. 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 Now new ulcer 2nd left toe with early ischemic changes of the toe/dark, discoloration.  New onset left calf swelling. Foot is warm but DP pulse not palpable.  Toe discolored but not grossly ischemic or gangrenous. Ulcer with cornified skin changes at tip of 2nd toe. No sensation in foot.  Only 4 toes on left foot. Significant asymmetric swelling, induration, left calf to knee. We feel he needs urgent evaluation in hospital to prevent spread of infection, assess for osteomyelitis, assess for need for amputation.

## 2014-12-13 NOTE — Progress Notes (Signed)
Pt c/o chest pain that radiated to left arm rated a 8/10.  STAT EKG taken and showed septal infarct and A fib. w/ RVR. VSS.  Pt placed on tele.  Dr. Gordy Levan made aware.  Awaiting orders.

## 2014-12-13 NOTE — H&P (Signed)
Date: 12/13/2014               Patient Name:  [name] MRN: [id number]  DOB: 1950-08-28 Age / Sex: 64 y.o., male   PCP: Aldine Contes, MD         Medical Service: Internal Medicine Teaching Service         Attending Physician: Dr. Oval Linsey, MD    First Contact: Dr. Lottie Mussel Pager: [phone]  Second Contact: Dr. 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 He was seen in [facility] today for darkening of his L second toe over the past week. He has noticed some bleeding from the lesion but no pus drainage. He denies any pain but has no feeling from his mid-calf down on his L leg chronically. He also thinks that his L leg has gotten progressively swollen. He denies any recent immobilization such as traveling, history of blood clots, does not smoke. In clinic, he had no sensation in his L foot and difficulty palpating distal pulses in that foot so he was directly admitted for plantar eschar evaluation and treatment. He denies any fevers, chills, night sweats, chest pain, SOB, palpitations, abdominal pain, nausea, emesis, diarrhea, constipation, dysuria, weakness.  After initial assessment, RN called at 5 pm that he had chest pain and she obtained EKG. He says he had episode of pain that starts in his L arm and radiates to his L chest, no other radiation. He says it is sharp. It has associated lightheadedness. He has had episodes in the past but no prior work-up. He denies any reflex, drug use.  Meds: Current Facility-Administered Medications  Medication Dose Route Frequency Provider Last Rate Last Dose  . acetaminophen (TYLENOL) tablet 650 mg  650 mg Oral Q6H PRN Jones Bales, MD       Or  .  acetaminophen (TYLENOL) suppository 650 mg  650 mg Rectal Q6H PRN Jones Bales, MD      . albuterol (PROVENTIL) (2.5 MG/3ML) 0.083% nebulizer solution 3 mL  3 mL Inhalation Q6H PRN Jones Bales, MD      . Derrill Memo ON 12/14/2014] aspirin EC tablet 81 mg  81 mg Oral Daily Jones Bales, MD      . atorvastatin (LIPITOR) tablet 20 mg  20 mg Oral q1800 Jones Bales, MD      . Derrill Memo ON 12/14/2014] cholecalciferol (VITAMIN D) tablet 2,000 Units  2,000 Units Oral Daily Jones Bales, MD      . Derrill Memo ON 12/14/2014] enalapril (VASOTEC) tablet 40 mg  40 mg Oral Daily Jones Bales, MD      . furosemide (LASIX) tablet 40 mg  40 mg Oral BID Jones Bales, MD      . heparin injection 5,000 Units  5,000 Units Subcutaneous 3 times per day Jones Bales, MD      . insulin aspart (novoLOG) injection 0-20 Units  0-20 Units Subcutaneous TID WC Kelby Aline, MD      . insulin aspart (novoLOG) injection 0-5 Units  0-5 Units Subcutaneous QHS Kelby Aline, MD      . insulin glargine (LANTUS) injection 72 Units  72 Units Subcutaneous BID Jones Bales, MD      . latanoprost (XALATAN) 0.005 % ophthalmic solution 1 drop  1 drop Both Eyes QHS Jones Bales, MD      . Derrill Memo ON 12/14/2014] pantoprazole (PROTONIX) EC tablet 40 mg  40 mg Oral Daily Jones Bales, MD        Allergies: Allergies as of 12/13/2014 - Review Complete 12/13/2014  Allergen Reaction Noted  . Vancomycin  05/21/2006   Past Medical History  Diagnosis Date  . Retinopathy   . Neuropathy, lower extremity   . Hyperlipidemia   . GERD (gastroesophageal reflux disease)   . Erectile dysfunction   . Osteomyelitis of ankle and foot   . Hemorrhoids   . HEARING LOSS, SENSORINEURAL, BILATERAL 01/03/2007    Seen by ENT Dr. Orpah Greek D. Redmond Baseman 01/03/07  . Hypertension   . Asthma   . OSA on CPAP     Nocturnal polysomnogram on 01/21/2010 showed severe obstructive sleep apnea/hypopnea syndrome, AHI 74.1 per hour with non positional  events, moderately loud snoring, and oxygen desaturation to a nadir of 78% on room air.  CPAP was successfully titrated to 17 CWP, AHI 1.1 per hour using a large ResMed Mirage Quattro full-face mask with heated humidifier. Bruxism was noted.   . Type II diabetes mellitus     w/complication NOS, type II  . Edema, macular, due to secondary diabetes   . DIABETIC FOOT ULCER 06/20/2009  . Arthritis     "elbows & knees" (12/13/2014)  . Chronic renal insufficiency    Past Surgical History  Procedure Laterality Date  . Toe amputation Left 01/21/2006    S/P radical irrigation and debridement, left foot with third MTP joint amputation by Dr. Kathalene Frames. Mayer Camel.   Family History  Problem Relation Age of Onset  . Diabetes Mother     also 2 siblings  . Heart attack Father 14  . Throat cancer Brother    History   Social History  . Marital Status: Married    Spouse Name: N/A  . Number of Children: N/A  . Years of Education: 9th   Occupational History  .     Social History Main Topics  . Smoking status: Former Smoker -- 0.50 packs/day for 30 years    Types: Cigarettes    Quit date: 02/10/2010  . Smokeless tobacco: Never Used  . Alcohol Use: 6.6 oz/week    4 Standard drinks or equivalent, 4 Cans of beer, 3 Shots of liquor per week     Comment: 12/13/2014 "I only drink on the weekend; usually only on Saturday"  . Drug Use: No  . Sexual Activity: Not Currently   Other Topics Concern  . Not on file   Social History Narrative   Financial assistance application completed. Pt does not qualify for [employer] 10-17-09    Review of Systems: Review of systems negative except as noted above per HPI  Physical Exam: Blood pressure 135/83, pulse 70, temperature 97.8 F (36.6 C), temperature source Oral, resp. rate 18, height 6' 6" (1.981 m), weight 402 lb (182.346 kg), SpO2 98 %.  Gen: No acute distress, obese HEENT: Atraumatic, PERRL, EOMI, sclerae anicteric, moist mucous  membranes Heart: irregularly irregular, normal S1 S2, no murmurs, rubs, or gallops, no tenderness of chest wall Lungs: Clear to auscultation bilaterally, respirations unlabored Abd: Soft, non-tender, non-distended, + bowel sounds, no hepatosplenomegaly Ext: R leg with 1+ pitting edema, L leg with 1+ pitting edema  but more swollen/skin tight, some erythema on anterior shin of L leg, 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, 2nd toe with callus on distal plantar surface with open wound and some clear drainage, also callus on metatarsal-phalangeal base Neuro: A&O x 4, CN II-XII intact, finger-nose-finger coordination intact, strength 5/5 and symmetric in all extremities, sensation grossly intact, no Babinkski sign b/l  Lab results: CBG:  Recent Labs  12/13/14 1020  GLUCAP 140*   Hemoglobin A1C:  Recent Labs  12/13/14 1030  HGBA1C 8.4    Imaging results:  No results found.  Other results: EKG: atrial fibrillation, no obvious ischemic changes but poor quality on several EKGs, compared to prior NSR 08/05/11  Assessment & Plan by Problem: Principal Problem:   Foot ulcer, left Active Problems:   Diabetes type 2, uncontrolled   HYPERLIPIDEMIA   Diabetic peripheral neuropathy associated with type 2 diabetes mellitus   GERD   Chronic kidney disease   Hypertension   Atrial fibrillation, new onset  #Diabetic foot ulcer: Patrick Pietrzak was admitted for worsening L 2nd toe plantar eschar with concern for osteomyelitis. He does not appear septic per vital signs with CBC pending. However, he has poorly controlled diabetes and while he thinks he He is already s/p L 3rd toe amputation by Dr Frederik Pear in 2007. -L foot x-ray -L foot MRI wo contrast if x-ray negative -ortho consult in am -ABI -CBC, BMP, ESR, CRP -hold on antibiotics for now as does not appear infected  #Chest pain in setting of New Atrial Fibrillation: Patrick Sells initially denied any chest pain but we were called back by RN that he  had chest pain. EKG showed atrial fibrillation and review of telemetry showed he went from sinus to atrial fibrillation. Unclear if cardiac as he says it starts in his arm but still concerning with new onset atrial fibrillation. TIMI score minimum of 2 with his risk factors and use of ASA. -f/u cards -troponin q6h x 3 -telemetry -repeat EKG in am -TSH, UDS  #DM2: Hemoglobin A1c checked today was 8.4. At home he is on lantus 72 u bid, humalog 50 u breakfast, 30 u lunch, 55 u dinner. He has history of peripheral neuropathy and background diabetic retinopathy with macular edema. -lantus 72 u [place] bid -SSI resistance  #HTN: BP in clinic 142/76. At home he is on enalapril 40 mg daily, lasix 40 mg bid, ASA 81 mg daily, and atorvastatin 20 mg daily. -cont enalapril 40 mg daily, lasix 40 mg bid, ASA 81 mg daily, and atorvastatin 20 mg daily  #CKD: Presenting creatinine pending, baseline about 1.5. -f/u BMP  #HL: Last lipid profile 07/2014 with total cholester 146, LDL 56, HDL 32, triglyceride 291. At home he is on atorvastatin 20 mg daily.  #OSA: Sleep study 2011. USes CPAP at home -CPAP  #Diet: HH-CM  #DVT PPx: heparin 5000 u [place] tid pending cards consult  #Code: Full  Dispo: Disposition is deferred at this time, awaiting improvement of current medical problems. Anticipated discharge in approximately 2 day(s).   The patient does have a current PCP (Aldine Contes, MD) and does need an [facility] hospital follow-up appointment after discharge.  The patient does not know have transportation limitations that hinder transportation to clinic appointments.  Signed: Lottie Mussel, MD Internal Medicine, PGY-1 Pager [phone] 12/13/2014, 5:51 PM

## 2014-12-13 NOTE — Progress Notes (Signed)
ANTICOAGULATION CONSULT NOTE - Initial Consult  Pharmacy Consult for Heparin  Indication: atrial fibrillation  Allergies  Allergen Reactions  . Vancomycin     REACTION: ARF    Patient Measurements: Height: 6\' 6"  (198.1 cm) Weight: (!) 402 lb (182.346 kg) (pt stated ) IBW/kg (Calculated) : 91.4 Heparin Dosing Weight: 140 kg  Vital Signs: Temp: 97.8 F (36.6 C) (06/02 1652) Temp Source: Oral (06/02 1652) BP: 135/83 mmHg (06/02 1652) Pulse Rate: 70 (06/02 1652)  Labs: No results for input(s): HGB, HCT, PLT, APTT, LABPROT, INR, HEPARINUNFRC, CREATININE, CKTOTAL, CKMB, TROPONINI in the last 72 hours.  CrCl cannot be calculated (Patient has no serum creatinine result on file.).   Medical History: Past Medical History  Diagnosis Date  . Retinopathy   . Neuropathy, lower extremity   . Hyperlipidemia   . GERD (gastroesophageal reflux disease)   . Erectile dysfunction   . Osteomyelitis of ankle and foot   . Hemorrhoids   . HEARING LOSS, SENSORINEURAL, BILATERAL 01/03/2007    Seen by ENT Dr. Orpah Greek D. Redmond Baseman 01/03/07  . Hypertension   . Asthma   . OSA on CPAP     Nocturnal polysomnogram on 01/21/2010 showed severe obstructive sleep apnea/hypopnea syndrome, AHI 74.1 per hour with non positional events, moderately loud snoring, and oxygen desaturation to a nadir of 78% on room air.  CPAP was successfully titrated to 17 CWP, AHI 1.1 per hour using a large ResMed Mirage Quattro full-face mask with heated humidifier. Bruxism was noted.   . Type II diabetes mellitus     w/complication NOS, type II  . Edema, macular, due to secondary diabetes   . DIABETIC FOOT ULCER 06/20/2009  . Arthritis     "elbows & knees" (12/13/2014)  . CKD (chronic kidney disease), stage III   . History of echocardiogram     a. 04/2008 Echo: EF 50-55%, abnl LV relaxation, mildly dil LA.  . Morbid obesity      Assessment: 63yom with Hx DM - with toe amputations, [name], CKD BL Cr 1.5, presents with toe ulcer.   He was found to be in Afib CVR on metoprolol.  Plan to start heparin drip until plans for toe are made then switch to DOAC.  After BL labs drawn begin heparin.    Goal of Therapy:  Heparin level 0.3-0.7 units/ml Monitor platelets by anticoagulation protocol: Yes   Plan:   Heparin bolus 4000 uts/hr  Heparin drip 2000 uts/hr  Draw HL 6hr after drip started  Daily HL, CBC  Bonnita Nasuti Pharm.D. CPP, BCPS Clinical Pharmacist [phone] 12/13/2014 8:45 PM

## 2014-12-13 NOTE — Progress Notes (Signed)
Patient ID: [name], male   DOB: Dec 19, 1950, 64 y.o.   MRN: [id number]   Subjective:   HPI: [name] is a 64 y.o. gentleman with past medical history below presents for an acute visit due to a wound on her left foot.  Reason(s) for visit:  1. Left foot wound: Patient noticed darkening of his second left toe over the past 1 week. He has also seen some bleeding. However, he has no pain or pus drainage. His left leg has been progressively becoming swollen. He denies constitutional symptoms currently. He has a history of left third toe amputation several years ago due to? Diabetic foot. Orthopedic Surgeon was Dr. Mayer Camel 2. Left lower extremity swelling. This has been progressing over the last 1 week as well. No history of long car trips. He denies any calf pain. He has no history of blood clots. He is not a smoker.   Past Medical History  Diagnosis Date  . Diabetes mellitus     w/complication NOS, type II  . Edema, macular, due to secondary diabetes   . Retinopathy   . Neuropathy, lower extremity   . Hyperlipidemia   . Chronic renal insufficiency   . GERD (gastroesophageal reflux disease)   . Erectile dysfunction   . Osteomyelitis of ankle and foot   . Hemorrhoids   . DIABETIC FOOT ULCER 06/20/2009  . Obstructive sleep apnea     Nocturnal polysomnogram on 01/21/2010 showed severe obstructive sleep apnea/hypopnea syndrome, AHI 74.1 per hour with non positional events, moderately loud snoring, and oxygen desaturation to a nadir of 78% on room air.  CPAP was successfully titrated to 17 CWP, AHI 1.1 per hour using a large ResMed Mirage Quattro full-face mask with heated humidifier. Bruxism was noted.   Marland Kitchen HEARING LOSS, SENSORINEURAL, BILATERAL 01/03/2007    Seen by ENT Dr. Orpah Greek D. Redmond Baseman 01/03/07    ROS: Constitutional:  Denies fevers, chills, diaphoresis, appetite change and fatigue.  Respiratory: Denies SOB, DOE, cough, chest tightness, and wheezing.  CVS: No chest  pain, palpitations and leg swelling.  GI: No abdominal pain, nausea, vomiting, bloody stools GU: No dysuria, frequency, hematuria, or flank pain.  MSK: No myalgias, back pain, joint swelling, arthralgias  Psych: No depression symptoms. No SI or SA.    Objective:  Physical Exam: Filed Vitals:   12/13/14 1000  BP: 146/76  Pulse: 77  Temp: 98.4 F (36.9 C)  TempSrc: Oral  Height: 6\' 6"  (1.981 m)  Weight: 402 lb 4.8 oz (182.482 kg)  SpO2: 99%   General: Well nourished. No acute distress.  HEENT: Normal oral mucosa. MMM.  Lungs: CTA bilaterally. No wheezing. Heart: RRR; no extra sounds or murmurs  Abdomen: Non-distended, normal bowel sounds, soft, nontender; no hepatosplenomegaly  Extremities: No sensation in the left foot. It feels warm indicating not acute limb threatening ischemia. Left lower extremity edema to the level above mid shin. Distal pulses very difficult to appreciate in the left foot. He has darkening of the second left toe with skin opening on the plantar aspect of the distal phalanx. He has no sensation. Gentle squeezing of the toe elicits some clear fluid drainage but there is also some foul smell coming out of the toe. No calf tenderness. There is severe toenail deformities on both feet. No joint swelling or tenderness. Neurologic: Normal EOM,  Alert and oriented x3. No obvious neurologic/cranial nerve deficits.  Assessment & Plan:  Discussed case with Dr Beryle Beams See problem based charting for assessment  and plan.   Patient will be admitted to the inpatient service for evaluation of what appears to be a progressing left foot infection/osteomyelitis in the setting of uncontrolled diabetes. He is currently without constitutional symptoms and is a febrile. He is hemodynamically stable. Inpatient antibiotic management will be beneficial to avoid further complications. He will likely require left lower extremity Doppler to rule out a blood clot in addition to inpatient  orthopedic consult, bilateral ABIs, and imaging of his left foot for osteomyelitis.   Signed:  Jessee Avers, MD PGY-3 Internal Medicine Teaching Service Pager: [phone] 12/13/2014, 11:32 AM

## 2014-12-13 NOTE — Consult Note (Signed)
CARDIOLOGY CONSULT NOTE   Patient ID: [name] MRN: [id number], DOB/AGE: 1950/11/20   Admit date: 12/13/2014 Date of Consult: 12/13/2014   Primary Physician: Aldine Contes, MD Primary Cardiologist: new - seen by C. Angelena Form, MD   Pt. Profile  64 y/o male w/o prior cardiac hx who was admitted today with diabetic foot ulcer, lower ext edema, and afib.  Problem List  Past Medical History  Diagnosis Date  . Retinopathy   . Neuropathy, lower extremity   . Hyperlipidemia   . GERD (gastroesophageal reflux disease)   . Erectile dysfunction   . Osteomyelitis of ankle and foot   . Hemorrhoids   . HEARING LOSS, SENSORINEURAL, BILATERAL 01/03/2007    Seen by ENT Dr. Orpah Greek D. Redmond Baseman 01/03/07  . Hypertension   . Asthma   . OSA on CPAP     Nocturnal polysomnogram on 01/21/2010 showed severe obstructive sleep apnea/hypopnea syndrome, AHI 74.1 per hour with non positional events, moderately loud snoring, and oxygen desaturation to a nadir of 78% on room air.  CPAP was successfully titrated to 17 CWP, AHI 1.1 per hour using a large ResMed Mirage Quattro full-face mask with heated humidifier. Bruxism was noted.   . Type II diabetes mellitus     w/complication NOS, type II  . Edema, macular, due to secondary diabetes   . DIABETIC FOOT ULCER 06/20/2009  . Arthritis     "elbows & knees" (12/13/2014)  . CKD (chronic kidney disease), stage III   . History of echocardiogram     a. 04/2008 Echo: EF 50-55%, abnl LV relaxation, mildly dil LA.  . Morbid obesity     Past Surgical History  Procedure Laterality Date  . Toe amputation Left 01/21/2006    S/P radical irrigation and debridement, left foot with third MTP joint amputation by Dr. Kathalene Frames. Mayer Camel.     Allergies  Allergies  Allergen Reactions  . Vancomycin     REACTION: ARF    HPI   64 y/o male without prior cardiac history.  He does have a h/o HTN, HL, Obesity, and DM.  His father died of an MI in his 31's.  He has also  has a h/o diabetic foot ulcerations with osteo and amputation of the left third toe in 2007.  He reports that over the past year he has had intermittent palpitations, maybe once a month or less, described as palpitations w/o chest pain, dyspnea, presyncope, or syncope.  Over the past week, he has been having L>R lower ext edema w/o pnd, orthopnea, or change in exercise tolerance (can walk about 100 yds @ baseline w/o dyspnea).  He was seen in IM clinic today with concern for cellulitis and was admitted for further eval.  Once here, he developed PAF with rates in the low 100's.  He has noted palpitations but is otw asymptomatic.    Inpatient Medications  . [START ON 12/14/2014] aspirin EC  81 mg Oral Daily  . atorvastatin  20 mg Oral q1800  . [START ON 12/14/2014] cholecalciferol  2,000 Units Oral Daily  . [START ON 12/14/2014] enalapril  40 mg Oral Daily  . furosemide  40 mg Oral BID  . heparin  5,000 Units Subcutaneous 3 times per day  . insulin aspart  0-20 Units Subcutaneous TID WC  . insulin aspart  0-5 Units Subcutaneous QHS  . insulin glargine  72 Units Subcutaneous BID  . latanoprost  1 drop Both Eyes QHS  . [START ON 12/14/2014] pantoprazole  40 mg Oral Daily    Family History Family History  Problem Relation Age of Onset  . Diabetes Mother     also 2 siblings  . Heart attack Father 34  . Throat cancer Brother      Social History History   Social History  . Marital Status: Married    Spouse Name: N/A  . Number of Children: N/A  . Years of Education: 9th   Occupational History  .     Social History Main Topics  . Smoking status: Former Smoker -- 0.50 packs/day for 30 years    Types: Cigarettes    Quit date: 02/10/2010  . Smokeless tobacco: Never Used  . Alcohol Use: 6.6 oz/week    4 Standard drinks or equivalent, 4 Cans of beer, 3 Shots of liquor per week     Comment: 12/13/2014 "I only drink on the weekend; usually only on Saturday" - beer and liquor  . Drug Use: No  .  Sexual Activity: Not Currently   Other Topics Concern  . Not on file   Social History Narrative   Financial assistance application completed. Pt does not qualify for [place] 10-17-09\      Lives in [place] with family.  Does not routinely exercise.     Review of Systems  General:  No chills, fever, night sweats or weight changes.  Cardiovascular:  No chest pain, +++ dyspnea on exertion, +++ L>R LE edema, no orthopnea, palpitations, paroxysmal nocturnal dyspnea. Dermatological: No rash, lesions/masses Respiratory: No cough, +++ dyspnea w/ exertion. Urologic: No hematuria, dysuria Abdominal:   No nausea, vomiting, diarrhea, bright red blood per rectum, melena, or hematemesis Neurologic:  No visual changes, wkns, changes in mental status. All other systems reviewed and are otherwise negative except as noted above.  Physical Exam  Blood pressure 135/83, pulse 70, temperature 97.8 F (36.6 C), temperature source Oral, resp. rate 18, height 6\' 6"  (1.981 m), weight 402 lb (182.346 kg), SpO2 98 %.  General: Pleasant, NAD Psych: Normal affect. Neuro: Alert and oriented X 3. Moves all extremities spontaneously. HEENT: Normal  Neck: Supple without bruits.  Obese, difficult to gauge jvp. Lungs:  Resp regular and unlabored, CTA. Heart: IR, IR, tachy, distant, no s3, s4, or murmurs. Abdomen: obese, protuberant, non-tender, BS + x 4.  Extremities: No clubbing, cyanosis.  3+ LLE edema, trace RLE edema. DP/PT/Radials 2+ and equal bilaterally.  Labs  Lab Results  Component Value Date   CHOL 146 07/18/2014   HDL 32* 07/18/2014   LDLCALC 56 07/18/2014   TRIG 291* 07/18/2014    Radiology/Studies  No results found.  ECG  Coarse afib, 104, LAD, ? Old inf infarct - no acute st/t changes.  ASSESSMENT AND PLAN  1.  PAF:  Pt presented with LLE cellulitis and was incidentally found to have PAF.  He notes palpitations over the past year in the absence of c/p, dyspnea,  presyncope, or syncope.  He is currently hemodynamically stable with rates in the low 100's while in afib.  He has had several sub 2 second post-termination pauses followed by <10 beats of sinus rhythm with PAC's and then resumption of afib.  Check echo, tsh, mg, lytes.  Add lopressor 25 mg bid.  CHA2DS2VASc = 2 - will be 3 in August.  Ideally will plan to add Vinco (eliquis) however in light of cellulitis with unclear plans for toe on L foot at this point, will add heparin and add [place] later.  2.  LLE Cellulitis:  Per IM.  3.  HTN: stable.  Follow on bb.  4.  HL:  On statin.  LDL 56 earlier this year.  5.  OSA:  On cpap @ home.  6.  Morbid obesity:  Needs dietary counseling.  7.  DM II:  Per IM.   Signed, Murray Hodgkins, NP 12/13/2014, 6:21 PM   I have personally seen and examined this patient with Ignacia Bayley, NP. I agree with the assessment and plan as outlined above. New onset atrial fib. We have reviewed the risks of CVA. Will start beta blocker for rate control. Will start IV heparin tonight and if no surgical procedure planned, will need to be converted to oral anti-coagulant. Eliquis would be a good choice. He is willing to take oral anti-coagulation. Plan echo. Check TSH.   We will follow with you. He should be followed in our atrial fibrillation clinic post discharge.   MCALHANY,CHRISTOPHER 12/13/2014 6:53 PM

## 2014-12-14 ENCOUNTER — Inpatient Hospital Stay (HOSPITAL_COMMUNITY): Payer: Medicare Other

## 2014-12-14 ENCOUNTER — Encounter (HOSPITAL_COMMUNITY): Payer: Self-pay | Admitting: General Practice

## 2014-12-14 ENCOUNTER — Other Ambulatory Visit: Payer: Self-pay

## 2014-12-14 DIAGNOSIS — I480 Paroxysmal atrial fibrillation: Secondary | ICD-10-CM

## 2014-12-14 DIAGNOSIS — E11311 Type 2 diabetes mellitus with unspecified diabetic retinopathy with macular edema: Secondary | ICD-10-CM

## 2014-12-14 DIAGNOSIS — G4733 Obstructive sleep apnea (adult) (pediatric): Secondary | ICD-10-CM

## 2014-12-14 DIAGNOSIS — I4891 Unspecified atrial fibrillation: Secondary | ICD-10-CM

## 2014-12-14 DIAGNOSIS — E1165 Type 2 diabetes mellitus with hyperglycemia: Secondary | ICD-10-CM

## 2014-12-14 DIAGNOSIS — Z9989 Dependence on other enabling machines and devices: Secondary | ICD-10-CM

## 2014-12-14 DIAGNOSIS — M86172 Other acute osteomyelitis, left ankle and foot: Secondary | ICD-10-CM | POA: Diagnosis present

## 2014-12-14 DIAGNOSIS — E11621 Type 2 diabetes mellitus with foot ulcer: Principal | ICD-10-CM

## 2014-12-14 DIAGNOSIS — Z89422 Acquired absence of other left toe(s): Secondary | ICD-10-CM

## 2014-12-14 DIAGNOSIS — B9689 Other specified bacterial agents as the cause of diseases classified elsewhere: Secondary | ICD-10-CM

## 2014-12-14 DIAGNOSIS — Z794 Long term (current) use of insulin: Secondary | ICD-10-CM

## 2014-12-14 DIAGNOSIS — Z8249 Family history of ischemic heart disease and other diseases of the circulatory system: Secondary | ICD-10-CM

## 2014-12-14 DIAGNOSIS — E1142 Type 2 diabetes mellitus with diabetic polyneuropathy: Secondary | ICD-10-CM

## 2014-12-14 DIAGNOSIS — E785 Hyperlipidemia, unspecified: Secondary | ICD-10-CM

## 2014-12-14 DIAGNOSIS — L97529 Non-pressure chronic ulcer of other part of left foot with unspecified severity: Secondary | ICD-10-CM

## 2014-12-14 LAB — GLUCOSE, CAPILLARY
GLUCOSE-CAPILLARY: 177 mg/dL — AB (ref 65–99)
Glucose-Capillary: 239 mg/dL — ABNORMAL HIGH (ref 65–99)
Glucose-Capillary: 223 mg/dL — ABNORMAL HIGH (ref 65–99)
Glucose-Capillary: 199 mg/dL — ABNORMAL HIGH (ref 65–99)

## 2014-12-14 LAB — CBC
HCT: 34.7 % — ABNORMAL LOW (ref 39.0–52.0)
HEMOGLOBIN: 11.1 g/dL — AB (ref 13.0–17.0)
MCH: 28.2 pg (ref 26.0–34.0)
MCHC: 32 g/dL (ref 30.0–36.0)
MCV: 88.3 fL (ref 78.0–100.0)
Platelets: 194 10*3/uL (ref 150–400)
RBC: 3.93 MIL/uL — AB (ref 4.22–5.81)
RDW: 14.6 % (ref 11.5–15.5)
WBC: 6.4 10*3/uL (ref 4.0–10.5)

## 2014-12-14 LAB — T4, FREE: FREE T4: 0.82 ng/dL (ref 0.61–1.12)

## 2014-12-14 LAB — TROPONIN I
Troponin I: <0.03 ng/mL (ref <0.031)
Troponin I: <0.03 ng/mL (ref <0.031)
Troponin I: <0.03 ng/mL (ref <0.031)

## 2014-12-14 LAB — HEPARIN LEVEL (UNFRACTIONATED)
HEPARIN UNFRACTIONATED: 0.4 [IU]/mL (ref 0.30–0.70)
Heparin Unfractionated: 0.34 IU/mL (ref 0.30–0.70)

## 2014-12-14 MED ORDER — CHLORHEXIDINE GLUCONATE 4 % EX LIQD
60.0000 mL | Freq: Once | CUTANEOUS | Status: AC
  Administered 2014-12-15: 4 via TOPICAL
  Filled 2014-12-14: qty 60

## 2014-12-14 MED ORDER — DEXTROSE 5 % IV SOLN
3.0000 g | INTRAVENOUS | Status: AC
  Administered 2014-12-15: 3 g via INTRAVENOUS
  Filled 2014-12-14: qty 3000

## 2014-12-14 NOTE — Progress Notes (Signed)
Placed patient on CPAP automode 5-20cm H20 for the night.  Patient is tolerating well at this time.

## 2014-12-14 NOTE — Progress Notes (Signed)
  Echocardiogram 2D Echocardiogram has been performed.  Bobbye Charleston 12/14/2014, 11:06 AM

## 2014-12-14 NOTE — Consult Note (Signed)
Reason for Consult:Osteomyelitis left second toe Referring Physician: Hospitalists  [name] is an 64 y.o. male.  HPI: The patient is a 64 year old male with history of previous amputation for osteomyelitis of the third toe left foot.  He's had a two-week history of ulceration, drainage, and foul-smelling from the left second toe.  He has been admitted and worked up via the internal medicine service with MRI showing osteomyelitis of the second toe.  He has failed conservative care.  We are consult for management of second toe osteomyelitis left foot.  Past Medical History  Diagnosis Date  . Retinopathy   . Neuropathy, lower extremity   . Hyperlipidemia   . GERD (gastroesophageal reflux disease)   . Erectile dysfunction   . Osteomyelitis of ankle and foot   . Hemorrhoids   . HEARING LOSS, SENSORINEURAL, BILATERAL 01/03/2007    Seen by ENT Dr. Orpah Greek D. Redmond Baseman 01/03/07  . Hypertension   . Asthma   . OSA on CPAP     Nocturnal polysomnogram on 01/21/2010 showed severe obstructive sleep apnea/hypopnea syndrome, AHI 74.1 per hour with non positional events, moderately loud snoring, and oxygen desaturation to a nadir of 78% on room air.  CPAP was successfully titrated to 17 CWP, AHI 1.1 per hour using a large ResMed Mirage Quattro full-face mask with heated humidifier. Bruxism was noted.   . Type II diabetes mellitus     w/complication NOS, type II  . Edema, macular, due to secondary diabetes   . DIABETIC FOOT ULCER 06/20/2009  . Arthritis     "elbows & knees" (12/13/2014)  . CKD (chronic kidney disease), stage III   . History of echocardiogram     a. 04/2008 Echo: EF 50-55%, abnl LV relaxation, mildly dil LA.  . Morbid obesity     Past Surgical History  Procedure Laterality Date  . Toe amputation Left 01/21/2006    S/P radical irrigation and debridement, left foot with third MTP joint amputation by Dr. Kathalene Frames. Mayer Camel.    Family History  Problem Relation Age of Onset  . Diabetes  Mother     also 2 siblings  . Heart attack Father 29  . Throat cancer Brother     Social History:  reports that he quit smoking about 4 years ago. His smoking use included Cigarettes. He has a 15 pack-year smoking history. He has never used smokeless tobacco. He reports that he drinks about 6.6 oz of alcohol per week. He reports that he does not use illicit drugs.  Allergies:  Allergies  Allergen Reactions  . Vancomycin     REACTION: ARF    Medications: I have reviewed the patient's current medications.  Results for orders placed or performed during the hospital encounter of 12/13/14 (from the past 48 hour(s))  Glucose, capillary     Status: Abnormal   Collection Time: 12/13/14  6:19 PM  Result Value Ref Range   Glucose-Capillary 142 (H) 65 - 99 mg/dL  Urine rapid drug screen (hosp performed)not at [facility]     Status: None   Collection Time: 12/13/14  8:20 PM  Result Value Ref Range   Opiates NONE DETECTED NONE DETECTED   Cocaine NONE DETECTED NONE DETECTED   Benzodiazepines NONE DETECTED NONE DETECTED   Amphetamines NONE DETECTED NONE DETECTED   Tetrahydrocannabinol NONE DETECTED NONE DETECTED   Barbiturates NONE DETECTED NONE DETECTED    Comment:        DRUG SCREEN FOR MEDICAL PURPOSES ONLY.  IF CONFIRMATION IS NEEDED  FOR ANY PURPOSE, NOTIFY LAB WITHIN 5 DAYS.        LOWEST DETECTABLE LIMITS FOR URINE DRUG SCREEN Drug Class       Cutoff (ng/mL) Amphetamine      1000 Barbiturate      200 Benzodiazepine   258 Tricyclics       527 Opiates          300 Cocaine          300 THC              50   Urinalysis, Routine w reflex microscopic     Status: Abnormal   Collection Time: 12/13/14  9:20 PM  Result Value Ref Range   Color, Urine YELLOW YELLOW   APPearance CLEAR CLEAR   Specific Gravity, Urine 1.011 1.005 - 1.030   pH 6.0 5.0 - 8.0   Glucose, UA NEGATIVE NEGATIVE mg/dL   Hgb urine dipstick TRACE (A) NEGATIVE   Bilirubin Urine NEGATIVE NEGATIVE   Ketones, ur  NEGATIVE NEGATIVE mg/dL   Protein, ur NEGATIVE NEGATIVE mg/dL   Urobilinogen, UA 1.0 0.0 - 1.0 mg/dL   Nitrite NEGATIVE NEGATIVE   Leukocytes, UA NEGATIVE NEGATIVE  Urine microscopic-add on     Status: None   Collection Time: 12/13/14  9:20 PM  Result Value Ref Range   RBC / HPF 0-2 <3 RBC/hpf  Glucose, capillary     Status: Abnormal   Collection Time: 12/13/14  9:21 PM  Result Value Ref Range   Glucose-Capillary 161 (H) 65 - 99 mg/dL  CBC with Differential/Platelet     Status: Abnormal   Collection Time: 12/13/14  9:35 PM  Result Value Ref Range   WBC 6.0 4.0 - 10.5 K/uL   RBC 4.28 4.22 - 5.81 MIL/uL   Hemoglobin 12.1 (L) 13.0 - 17.0 g/dL   HCT 38.1 (L) 39.0 - 52.0 %   MCV 89.0 78.0 - 100.0 fL   MCH 28.3 26.0 - 34.0 pg   MCHC 31.8 30.0 - 36.0 g/dL   RDW 14.7 11.5 - 15.5 %   Platelets 205 150 - 400 K/uL   Neutrophils Relative % 55 43 - 77 %   Neutro Abs 3.4 1.7 - 7.7 K/uL   Lymphocytes Relative 32 12 - 46 %   Lymphs Abs 1.9 0.7 - 4.0 K/uL   Monocytes Relative 9 3 - 12 %   Monocytes Absolute 0.5 0.1 - 1.0 K/uL   Eosinophils Relative 3 0 - 5 %   Eosinophils Absolute 0.2 0.0 - 0.7 K/uL   Basophils Relative 1 0 - 1 %   Basophils Absolute 0.0 0.0 - 0.1 K/uL  Basic metabolic panel     Status: Abnormal   Collection Time: 12/13/14  9:35 PM  Result Value Ref Range   Sodium 138 135 - 145 mmol/L   Potassium 4.8 3.5 - 5.1 mmol/L   Chloride 103 101 - 111 mmol/L   CO2 26 22 - 32 mmol/L   Glucose, Bld 210 (H) 65 - 99 mg/dL   BUN 17 6 - 20 mg/dL   Creatinine, Ser 1.75 (H) 0.61 - 1.24 mg/dL   Calcium 9.2 8.9 - 10.3 mg/dL   GFR calc non Af Amer 39 (L) >60 mL/min   GFR calc Af Amer 46 (L) >60 mL/min    Comment: (NOTE) The eGFR has been calculated using the CKD EPI equation. This calculation has not been validated in all clinical situations. eGFR's persistently <60 mL/min signify possible Chronic Kidney Disease.  Anion gap 9 5 - 15  Sedimentation rate     Status: Abnormal    Collection Time: 12/13/14  9:35 PM  Result Value Ref Range   Sed Rate 64 (H) 0 - 16 mm/hr  C-reactive protein     Status: Abnormal   Collection Time: 12/13/14  9:35 PM  Result Value Ref Range   CRP 4.7 (H) <1.0 mg/dL  TSH     Status: Abnormal   Collection Time: 12/13/14  9:35 PM  Result Value Ref Range   TSH 4.979 (H) 0.350 - 4.500 uIU/mL  Magnesium     Status: None   Collection Time: 12/13/14  9:35 PM  Result Value Ref Range   Magnesium 2.0 1.7 - 2.4 mg/dL  Phosphorus     Status: None   Collection Time: 12/13/14  9:35 PM  Result Value Ref Range   Phosphorus 3.4 2.5 - 4.6 mg/dL  Troponin I     Status: None   Collection Time: 12/13/14  9:35 PM  Result Value Ref Range   Troponin I <0.03 <0.031 ng/mL    Comment:        NO INDICATION OF MYOCARDIAL INJURY. AGE OF SPECIMEN MAY AFFECT INTEGRITY OF RESULTS   APTT     Status: None   Collection Time: 12/13/14  9:35 PM  Result Value Ref Range   aPTT 32 24 - 37 seconds  Protime-INR     Status: None   Collection Time: 12/13/14  9:35 PM  Result Value Ref Range   Prothrombin Time 14.1 11.6 - 15.2 seconds   INR 1.07 0.00 - 1.49  Troponin I     Status: None   Collection Time: 12/14/14  4:43 AM  Result Value Ref Range   Troponin I <0.03 <0.031 ng/mL    Comment:        NO INDICATION OF MYOCARDIAL INJURY.   Heparin level (unfractionated)     Status: None   Collection Time: 12/14/14  4:43 AM  Result Value Ref Range   Heparin Unfractionated 0.40 0.30 - 0.70 IU/mL    Comment:        IF HEPARIN RESULTS ARE BELOW EXPECTED VALUES, AND PATIENT DOSAGE HAS BEEN CONFIRMED, SUGGEST FOLLOW UP TESTING OF ANTITHROMBIN III LEVELS.   CBC     Status: Abnormal   Collection Time: 12/14/14  4:43 AM  Result Value Ref Range   WBC 6.4 4.0 - 10.5 K/uL   RBC 3.93 (L) 4.22 - 5.81 MIL/uL   Hemoglobin 11.1 (L) 13.0 - 17.0 g/dL   HCT 34.7 (L) 39.0 - 52.0 %   MCV 88.3 78.0 - 100.0 fL   MCH 28.2 26.0 - 34.0 pg   MCHC 32.0 30.0 - 36.0 g/dL   RDW 14.6  11.5 - 15.5 %   Platelets 194 150 - 400 K/uL  T4, free     Status: None   Collection Time: 12/14/14  4:43 AM  Result Value Ref Range   Free T4 0.82 0.61 - 1.12 ng/dL  Glucose, capillary     Status: Abnormal   Collection Time: 12/14/14  8:01 AM  Result Value Ref Range   Glucose-Capillary 177 (H) 65 - 99 mg/dL  Troponin I     Status: None   Collection Time: 12/14/14 11:25 AM  Result Value Ref Range   Troponin I <0.03 <0.031 ng/mL    Comment:        NO INDICATION OF MYOCARDIAL INJURY.   Heparin level (unfractionated)     Status:  None   Collection Time: 12/14/14 11:25 AM  Result Value Ref Range   Heparin Unfractionated 0.34 0.30 - 0.70 IU/mL    Comment:        IF HEPARIN RESULTS ARE BELOW EXPECTED VALUES, AND PATIENT DOSAGE HAS BEEN CONFIRMED, SUGGEST FOLLOW UP TESTING OF ANTITHROMBIN III LEVELS.   Glucose, capillary     Status: Abnormal   Collection Time: 12/14/14 11:59 AM  Result Value Ref Range   Glucose-Capillary 239 (H) 65 - 99 mg/dL  Glucose, capillary     Status: Abnormal   Collection Time: 12/14/14  5:30 PM  Result Value Ref Range   Glucose-Capillary 223 (H) 65 - 99 mg/dL    Mr Foot Left Wo Contrast  12/13/2014   CLINICAL DATA:  Second toe ulceration with drainage. History of diabetes and third toe amputation.  EXAM: MRI OF THE LEFT FOREFOOT WITHOUT CONTRAST  TECHNIQUE: Multiplanar, multisequence MR imaging was performed. No intravenous contrast was administered.  COMPARISON:  Radiographs same date.  FINDINGS: Study is moderately motion degraded, especially on the T2 weighted images. In correlation with prior radiographs, there has been previous amputation through the third metatarsal neck.  As noted on today's radiographs, there is abnormality of the distal phalanx of the second toe which appears eroded with T2 hyperintensity. There is adjacent soft tissue T2 hyperintensity extending to the skin, best seen on sagittal images 21 and 22. This measures up to 1.5 cm in  diameter and is suspicious for a small abscess. No definite abnormality of the middle or proximal phalanx identified. There is mild soft tissue swelling throughout the second toe.  The other toes appear normal. Arthropathic changes are present at the second metatarsal phalangeal joint with chronic flattening of the second metatarsal head.  There is dorsal subcutaneous edema throughout the forefoot without focal fluid collection. There appears to be a small amount of fluid within the extensor tendon sheaths.  IMPRESSION: 1. Examination is moderately degraded by motion. 2. Soft tissue swelling in the second toe with ill-defined fluid collection adjacent to the distal phalanx suspicious for small abscess. The distal phalanx is eroded with T2 hyperintensity. These findings are supportive of osteomyelitis in the appropriate clinical context. 3. Stable postsurgical changes from previous amputation through the third metatarsal neck.   Electronically Signed   By: Richardean Sale M.D.   On: 12/13/2014 21:21   Dg Foot Complete Left  12/13/2014   CLINICAL DATA:  Left second toe ulceration.  Initial encounter.  EXAM: LEFT FOOT - COMPLETE 3+ VIEW  COMPARISON:  Radiographs 01/18/2013 and 04/19/2012.  FINDINGS: Previous amputation through the third metatarsal neck. There are chronic arthropathic changes at the first and second metatarsal phalangeal joints with chronic flattening of the second metatarsal head. There is new soft tissue swelling in the second toe with erosion of the distal phalanx. Bone infarct noted in the distal tibia.  IMPRESSION: New soft tissue swelling in the second toe with erosion of the distal phalanx suggesting possible osteomyelitis. No other significant changes identified.   Electronically Signed   By: Richardean Sale M.D.   On: 12/13/2014 18:35    ROS  ROS: I have reviewed the patient's review of systems thoroughly and there are no positive responses as relates to the HPI.  Blood pressure 122/63,  pulse 60, temperature 97.8 F (36.6 C), temperature source Oral, resp. rate 16, height $RemoveBe'6\' 6"'[id number]$  (1.981 m), weight 403 lb 7.1 oz (183 kg), SpO2 99 %. Physical Exam Well-developed well-nourished patient in no acute  distress. Alert and oriented x3 HEENT:within normal limits Cardiac: Regular rate and rhythm Pulmonary: Lungs clear to auscultation Abdomen: Soft and nontender.  Normal active bowel sounds  Musculoskeletal: (Left foot with stage IV ulceration over the distal tuft.  There is drainage and foul smell.  There's significant fusiform swelling of the toe.)  Assessment/Plan: 64 year old diabetic with osteomyelitis of the left second toe.  He's had previous third toe at dictation.  MRI confirms the diagnosis.//We had a long discussion of treatment options but I think he needs a left second toe amputation.  I will plan on doing this tomorrow.I have had a prolonged discussion with the patient regarding the risk and benefits of the surgical procedure.  The patient understands the risks include but are not limited to bleeding, infection and failure of the surgery to cure the problem and need for further surgery.  The patient understands there is a slight risk of death at the time of surgery.  The patient understands these risks along with the potential benefits and wishes to proceed with surgical intervention.  The patient will be followed in the office in the postoperative period.  Brave Dack L 12/14/2014, 8:30 PM

## 2014-12-14 NOTE — Progress Notes (Signed)
Notified MD oncall that pt requesting sleep medication. MD stated she would put in something for sleep. Will continue to monitor pt. Ranelle Oyster, RN

## 2014-12-14 NOTE — Progress Notes (Signed)
Notified Ben, pharmacist that pt going to surgery around 1330 tomorrow afternoon. Pharmacist stated he would make a note of it and for dayshift nurse to talk with MD tomorrow to see what time Heparin drip will be turned off tomorrow prior to surgery. Ben, pharmacist stated to continue infusing Heparin drip throughout tonight. Will continue to monitor pt. Ranelle Oyster, RN

## 2014-12-14 NOTE — Progress Notes (Signed)
ANTICOAGULATION CONSULT NOTE - Follow Up Consult  Pharmacy Consult for heparin Indication: atrial fibrillation   Labs:  Recent Labs  12/13/14 2135 12/14/14 0443  HGB 12.1* 11.1*  HCT 38.1* 34.7*  PLT 205 194  APTT 32  --   LABPROT 14.1  --   INR 1.07  --   HEPARINUNFRC  --  0.40  CREATININE 1.75*  --   TROPONINI <0.03  --      Assessment/Plan:  64yo male therapeutic on heparin with initial dosing for new Afib. Will continue gtt at current rate and confirm stable with additional level.   Wynona Neat, PharmD, BCPS  12/14/2014,5:51 AM

## 2014-12-14 NOTE — Progress Notes (Signed)
Chaplain responded to consult that pt requested prayer.  Pt sitting in chair beside bed, reports "feeling a little better" today.  Pt is waiting for MD to inform as to whether or not toe will be amputated.  Pt says, "I'd rather just be done with it to save my leg."  Pt seems to have a positive attitude about plan of care.  Chaplain provided prayer and emotional support and is available to follow up as needed.    12/14/14 1000  Clinical Encounter Type  Visited With Patient  Visit Type Initial;Spiritual support  Referral From Nurse  Spiritual Encounters  Spiritual Needs Prayer;Emotional  Stress Factors  Patient Stress Factors None identified   Mertie Moores, Chaplain 12/14/2014 10:08 AM

## 2014-12-14 NOTE — Progress Notes (Signed)
ANTICOAGULATION CONSULT NOTE - Follow Up Consult  Pharmacy Consult for heparin Indication: atrial fibrillation  Allergies  Allergen Reactions  . Vancomycin     REACTION: ARF    Patient Measurements: Height: 6\' 6"  (198.1 cm) Weight: (!) 403 lb 7.1 oz (183 kg) IBW/kg (Calculated) : 91.4 Heparin Dosing Weight:   Vital Signs: Temp: 97.8 F (36.6 C) (06/03 0506) Temp Source: Oral (06/03 0506) BP: 137/71 mmHg (06/03 0506) Pulse Rate: 71 (06/03 0506)  Labs:  Recent Labs  12/13/14 2135 12/14/14 0443  HGB 12.1* 11.1*  HCT 38.1* 34.7*  PLT 205 194  APTT 32  --   LABPROT 14.1  --   INR 1.07  --   HEPARINUNFRC  --  0.40  CREATININE 1.75*  --   TROPONINI <0.03 <0.03    Estimated Creatinine Clearance: 77.2 mL/min (by C-G formula based on Cr of 1.75).   Medications:  Scheduled:  . aspirin EC  81 mg Oral Daily  . atorvastatin  20 mg Oral q1800  . cholecalciferol  2,000 Units Oral Daily  . enalapril  40 mg Oral Daily  . furosemide  40 mg Oral BID  . insulin aspart  0-20 Units Subcutaneous TID WC  . insulin aspart  0-5 Units Subcutaneous QHS  . insulin glargine  72 Units Subcutaneous BID  . latanoprost  1 drop Both Eyes QHS  . metoprolol tartrate  25 mg Oral BID  . pantoprazole  40 mg Oral Daily   Infusions:  . heparin 2,000 Units/hr (12/13/14 2217)    Assessment: 64 yo male with afib is currently on therapeutic heparin.  Heparin level is 0.34.  Goal of Therapy:  Heparin level 0.3-0.7 units/ml Monitor platelets by anticoagulation protocol: Yes   Plan:  Continue Heparin drip 2000 uts/hr   Daily heparin level and CBC  Kla Bily, Tsz-Yin 12/14/2014,7:49 AM

## 2014-12-14 NOTE — H&P (Signed)
Internal Medicine Attending Admission Note Date: 12/14/2014  Patient name: [name] Medical record number: [id number] Date of birth: 08-29-1950 Age: 64 y.o. Gender: male  I saw and evaluated the patient. I reviewed the resident's note and I agree with the resident's findings and plan as documented in the resident's note.  Chief Complaint(s): Left second toe darkening and drainage 1 week  History - key components related to admission:  [name] is a 64 year old man with a history of diabetes complicated by peripheral neuropathy who presents with a one-week history of left second toe darkening and drainage. He has a history of previous osteomyelitis of the third toe on the left requiring amputation in 2007. He was seen in the [facility] for the left second toe and concern was raised for possible osteomyelitis. He was therefore admitted to the internal medicine teaching service for further evaluation and care. He specifically denies any fevers, shakes, chills, chest pain, shortness of breath, or any known injury to that toe although he cannot feel his legs below the knees.  Physical Exam - key components related to admission:  Filed Vitals:   12/13/14 1652 12/13/14 2124 12/14/14 0506 12/14/14 1344  BP: 135/83 147/92 137/71 122/63  Pulse: 70 73 71 60  Temp: 97.8 F (36.6 C) 98 F (36.7 C) 97.8 F (36.6 C) 97.8 F (36.6 C)  TempSrc: Oral Oral Oral Oral  Resp: 18 18 20 16   Height:      Weight:   403 lb 7.1 oz (183 kg)   SpO2: 98% 100% 97% 99%   Gen. well-developed, morbidly obese, man sitting comfortably in bed in no acute distress Extremities: Malodorous purulent drainage around a plantar callus at the distal end of the left second toe. Previous left third toe amputation.  Lab results:  Basic Metabolic Panel:  Recent Labs  12/13/14 2135  NA 138  K 4.8  CL 103  CO2 26  GLUCOSE 210*  BUN 17  CREATININE 1.75*  CALCIUM 9.2  MG 2.0  PHOS 3.4    CBC:  Recent Labs  12/13/14 2135 12/14/14 0443  WBC 6.0 6.4  NEUTROABS 3.4  --   HGB 12.1* 11.1*  HCT 38.1* 34.7*  MCV 89.0 88.3  PLT 205 194   Cardiac Enzymes:  Recent Labs  12/13/14 2135 12/14/14 0443 12/14/14 1125  TROPONINI <0.03 <0.03 <0.03   CBG:  Recent Labs  12/13/14 1020 12/13/14 1819 12/13/14 2121 12/14/14 0801 12/14/14 1159 12/14/14 1730  GLUCAP 140* 142* 161* 177* 239* 223*   Hemoglobin A1C:  Recent Labs  12/13/14 1030  HGBA1C 8.4   Thyroid Function Tests:  Recent Labs  12/13/14 2135 12/14/14 0443  TSH 4.979*  --   FREET4  --  0.82   Coagulation:  Recent Labs  12/13/14 2135  INR 1.07   Urine Drug Screen:  Negative  Urinalysis:  Specific gravity 1.011, pH 6.0, hemoglobin trace, negative nitrite, negative leukocytes, 0-2 red blood cells per high-power field  Misc. Labs:  C reactive protein 4.7 ESR 64 Blood culture pending 2  Imaging results:  Mr Foot Left Wo Contrast  12/13/2014   CLINICAL DATA:  Second toe ulceration with drainage. History of diabetes and third toe amputation.  EXAM: MRI OF THE LEFT FOREFOOT WITHOUT CONTRAST  TECHNIQUE: Multiplanar, multisequence MR imaging was performed. No intravenous contrast was administered.  COMPARISON:  Radiographs same date.  FINDINGS: Study is moderately motion degraded, especially on the T2 weighted images. In correlation with prior radiographs, there  has been previous amputation through the third metatarsal neck.  As noted on today's radiographs, there is abnormality of the distal phalanx of the second toe which appears eroded with T2 hyperintensity. There is adjacent soft tissue T2 hyperintensity extending to the skin, best seen on sagittal images 21 and 22. This measures up to 1.5 cm in diameter and is suspicious for a small abscess. No definite abnormality of the middle or proximal phalanx identified. There is mild soft tissue swelling throughout the second toe.  The other toes  appear normal. Arthropathic changes are present at the second metatarsal phalangeal joint with chronic flattening of the second metatarsal head.  There is dorsal subcutaneous edema throughout the forefoot without focal fluid collection. There appears to be a small amount of fluid within the extensor tendon sheaths.  IMPRESSION: 1. Examination is moderately degraded by motion. 2. Soft tissue swelling in the second toe with ill-defined fluid collection adjacent to the distal phalanx suspicious for small abscess. The distal phalanx is eroded with T2 hyperintensity. These findings are supportive of osteomyelitis in the appropriate clinical context. 3. Stable postsurgical changes from previous amputation through the third metatarsal neck.   Electronically Signed   By: Richardean Sale M.D.   On: 12/13/2014 21:21   Dg Foot Complete Left  12/13/2014   CLINICAL DATA:  Left second toe ulceration.  Initial encounter.  EXAM: LEFT FOOT - COMPLETE 3+ VIEW  COMPARISON:  Radiographs 01/18/2013 and 04/19/2012.  FINDINGS: Previous amputation through the third metatarsal neck. There are chronic arthropathic changes at the first and second metatarsal phalangeal joints with chronic flattening of the second metatarsal head. There is new soft tissue swelling in the second toe with erosion of the distal phalanx. Bone infarct noted in the distal tibia.  IMPRESSION: New soft tissue swelling in the second toe with erosion of the distal phalanx suggesting possible osteomyelitis. No other significant changes identified.   Electronically Signed   By: Richardean Sale M.D.   On: 12/13/2014 18:35   Assessment & Plan by Problem:  [name] is a 64 year old man with diabetes complicated by peripheral neuropathy who presents with increased drainage and swelling of the left second toe. He was found to have osteomyelitis on MRI.  1) Osteomyelitis of the left second toe: We are holding on antibiotics at this time in order to maintain the  purity of the culture obtain at surgery. Orthopedic surgery has been consulted and their evaluation is pending at this time.  2) Paroxysmal atrial fibrillation: He was found to be in paroxysmal atrial fibrillation soon after admission. Cardiology assessment is appreciated. It is felt that a stress test is not indicated at this time preoperatively.  3) Disposition: Pending orthopedic's assessment and intervention.

## 2014-12-14 NOTE — Progress Notes (Signed)
Subjective:  No chest pain or SOB.   Objective:  Vital Signs in the last 24 hours: Temp:  [97.8 F (36.6 C)-98.3 F (36.8 C)] 97.8 F (36.6 C) (06/03 0506) Pulse Rate:  [67-73] 71 (06/03 0506) Resp:  [18-20] 20 (06/03 0506) BP: (135-147)/(71-92) 137/71 mmHg (06/03 0506) SpO2:  [97 %-100 %] 97 % (06/03 0506) Weight:  [402 lb (182.346 kg)-403 lb 7.1 oz (183 kg)] 403 lb 7.1 oz (183 kg) (06/03 0506)  Intake/Output from previous day:  Intake/Output Summary (Last 24 hours) at 12/14/14 1306 Last data filed at 12/14/14 0911  Gross per 24 hour  Intake    600 ml  Output      0 ml  Net    600 ml    Physical Exam: General appearance: alert, cooperative, no distress and morbidly obese Neck: no carotid bruit and no JVD Lungs: clear to auscultation bilaterally Heart: regular rate and rhythm Extremities: draining Lt foot ulcer   Rate: 74  Rhythm: normal sinus rhythm and periods of PAF  Lab Results:  Recent Labs  12/13/14 2135 12/14/14 0443  WBC 6.0 6.4  HGB 12.1* 11.1*  PLT 205 194    Recent Labs  12/13/14 2135  NA 138  K 4.8  CL 103  CO2 26  GLUCOSE 210*  BUN 17  CREATININE 1.75*    Recent Labs  12/14/14 0443 12/14/14 1125  TROPONINI <0.03 <0.03    Recent Labs  12/13/14 2135  INR 1.07    Scheduled Meds: . aspirin EC  81 mg Oral Daily  . atorvastatin  20 mg Oral q1800  . cholecalciferol  2,000 Units Oral Daily  . enalapril  40 mg Oral Daily  . furosemide  40 mg Oral BID  . insulin aspart  0-20 Units Subcutaneous TID WC  . insulin aspart  0-5 Units Subcutaneous QHS  . insulin glargine  72 Units Subcutaneous BID  . latanoprost  1 drop Both Eyes QHS  . metoprolol tartrate  25 mg Oral BID  . pantoprazole  40 mg Oral Daily   Continuous Infusions: . heparin 2,000 Units/hr (12/13/14 2217)   PRN Meds:.acetaminophen **OR** acetaminophen, albuterol   Imaging: Imaging results have been reviewed  Cardiac Studies: Echo and LE arterial dopplers  pending  Assessment/Plan:  64 y/o morbidly obese diabetic male with a history of HTN, dyslipidemia, OSA, and FM Hx of CAD presented with LLE cellulitis and was incidentally found to have PAF. He notes palpitations over the past year in the absence of c/p, dyspnea, presyncope, or syncope. He is currently hemodynamically stable with rates in the low 100's while in afib. He has had several sub 2 second post-termination pauses followed by <10 beats of sinus rhythm with PAC's and then resumption of afib. He is CHA2DS2VASc = 2 - will be 3 in August. Ideally will plan to add [place] (eliquis) however in light of cellulitis with unclear plans for toe on L foot at this point, Heparin and Lopressor added.   Principal Problem:   Foot ulcer, left Active Problems:   Atrial fibrillation, new onset   Diabetes type 2, uncontrolled   Chronic kidney disease, stage III (moderate)   Hypertension   Dyslipidemia   Diabetic peripheral neuropathy associated with type 2 diabetes mellitus   GERD   Obesity BMI 57   Family history of coronary artery disease in father- died 37 of MI   Sleep apnea-on C-pap   PLAN: Multiple cardiac risk factors. In and out of PAF.  Awaiting echo and ABI's. He may need pre op Myoview if surgery to be done.   Kerin Ransom PA-C 12/14/2014, 1:06 PM [phone]  Patient seen, examined. Available data reviewed. Agree with findings, assessment, and plan as outlined by Kerin Ransom, PA-C. The patient was independently interviewed and examined. He is morbidly obese. Heart is regular rate and rhythm without murmur. Lungs are clear. Echo shows normal LV function with an ejection fraction of 55%.  Study Conclusions  - Left ventricle: Technically limited study. The cavity size was normal. Wall thickness was increased in a pattern of mild LVH. The estimated ejection fraction was 55%. Regional wall motion abnormalities cannot be excluded. - Aorta: Slight dilitation of the root. Aortic root  dimension: 40 mm (ED). - Left atrium: The atrium was moderately dilated. - Right ventricle: The cavity size was mildly dilated. Systolic function was normal.  The patient appears stable to go forward with his amputation as planned. He converted spontaneously back to sinus rhythm this morning at 8 AM. I reviewed his telemetry and he remains in sinus rhythm. I agree that he should be started on anticoagulation once he has recovered from surgery. I do not think a stress test is indicated. Because of his body habitus, stress testing would have a very high potential for false positive findings. He is not having active angina. Would continue with his current medical treatment at this time.  Sherren Mocha, M.D. 12/14/2014 3:33 PM

## 2014-12-14 NOTE — Progress Notes (Signed)
Subjective: Mr Ebron feels well this morning. He says he had no further episodes of chest pain, palpitations, SOB. He does not have fever, chills, or night sweats. We discussed risks and benefits of long term anticoagulation and he was agreeable.  Objective: Vital signs in last 24 hours: Filed Vitals:   12/13/14 1652 12/13/14 2124 12/14/14 0506 12/14/14 1344  BP: 135/83 147/92 137/71 122/63  Pulse: 70 73 71 60  Temp: 97.8 F (36.6 C) 98 F (36.7 C) 97.8 F (36.6 C) 97.8 F (36.6 C)  TempSrc: Oral Oral Oral Oral  Resp: $Remo'18 18 20 16  'mBZKP$ Height:      Weight:   403 lb 7.1 oz (183 kg)   SpO2: 98% 100% 97% 99%   Weight change:   Intake/Output Summary (Last 24 hours) at 12/14/14 1359 Last data filed at 12/14/14 1344  Gross per 24 hour  Intake   1080 ml  Output      0 ml  Net   1080 ml   Gen: No acute distress, obese HEENT: Atraumatic, PERRL, EOMI, sclerae anicteric, moist mucous membranes Heart: RRR, normal S1 S2, no murmurs, rubs, or gallops Lungs: Clear to auscultation bilaterally, respirations unlabored Abd: Soft, non-tender, non-distended, + bowel sounds, no hepatosplenomegaly Ext: R leg with 1+ pitting edema, L leg with 1+ pitting edema but more swollen/skin tight, some erythema on anterior shin of L leg, 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, 2nd toe with callus on distal plantar surface with open wound and some clear drainage, also callus on metatarsal-phalangeal base Neuro: A&O x 4, CN II-XII intact, finger-nose-finger coordination intact, strength 5/5 and symmetric in all extremities, sensation grossly intact, no Babinkski sign b/l      Lab Results: Basic Metabolic Panel:  Recent Labs Lab 12/13/14 2135  NA 138  K 4.8  CL 103  CO2 26  GLUCOSE 210*  BUN 17  CREATININE 1.75*  CALCIUM 9.2  MG 2.0  PHOS 3.4   CBC:  Recent Labs Lab 12/13/14 2135 12/14/14 0443  WBC 6.0 6.4  NEUTROABS 3.4  --   HGB 12.1* 11.1*  HCT 38.1* 34.7*  MCV 89.0 88.3  PLT 205 194    Cardiac Enzymes:  Recent Labs Lab 12/13/14 2135 12/14/14 0443 12/14/14 1125  TROPONINI <0.03 <0.03 <0.03   CBG:  Recent Labs Lab 12/13/14 1020 12/13/14 1819 12/13/14 2121 12/14/14 0801 12/14/14 1159  GLUCAP 140* 142* 161* 177* 239*   Hemoglobin A1C:  Recent Labs Lab 12/13/14 1030  HGBA1C 8.4    Thyroid Function Tests:  Recent Labs Lab 12/13/14 2135 12/14/14 0443  TSH 4.979*  --   FREET4  --  0.82   Coagulation:  Recent Labs Lab 12/13/14 2135  LABPROT 14.1  INR 1.07   Urine Drug Screen: Drugs of Abuse     Component Value Date/Time   LABOPIA NONE DETECTED 12/13/2014 2020   COCAINSCRNUR NONE DETECTED 12/13/2014 2020   LABBENZ NONE DETECTED 12/13/2014 2020   AMPHETMU NONE DETECTED 12/13/2014 2020   THCU NONE DETECTED 12/13/2014 2020   LABBARB NONE DETECTED 12/13/2014 2020    Urinalysis:  Recent Labs Lab 12/13/14 2120  COLORURINE YELLOW  LABSPEC 1.011  PHURINE 6.0  GLUCOSEU NEGATIVE  HGBUR TRACE*  BILIRUBINUR NEGATIVE  KETONESUR NEGATIVE  PROTEINUR NEGATIVE  UROBILINOGEN 1.0  NITRITE NEGATIVE  LEUKOCYTESUR NEGATIVE   ESR 64 CRP 4.7  Micro Results: No results found for this or any previous visit (from the past 240 hour(s)). Studies/Results: Mr Foot Left Wo Contrast  12/13/2014   CLINICAL DATA:  Second toe ulceration with drainage. History of diabetes and third toe amputation.  EXAM: MRI OF THE LEFT FOREFOOT WITHOUT CONTRAST  TECHNIQUE: Multiplanar, multisequence MR imaging was performed. No intravenous contrast was administered.  COMPARISON:  Radiographs same date.  FINDINGS: Study is moderately motion degraded, especially on the T2 weighted images. In correlation with prior radiographs, there has been previous amputation through the third metatarsal neck.  As noted on today's radiographs, there is abnormality of the distal phalanx of the second toe which appears eroded with T2 hyperintensity. There is adjacent soft tissue T2 hyperintensity  extending to the skin, best seen on sagittal images 21 and 22. This measures up to 1.5 cm in diameter and is suspicious for a small abscess. No definite abnormality of the middle or proximal phalanx identified. There is mild soft tissue swelling throughout the second toe.  The other toes appear normal. Arthropathic changes are present at the second metatarsal phalangeal joint with chronic flattening of the second metatarsal head.  There is dorsal subcutaneous edema throughout the forefoot without focal fluid collection. There appears to be a small amount of fluid within the extensor tendon sheaths.  IMPRESSION: 1. Examination is moderately degraded by motion. 2. Soft tissue swelling in the second toe with ill-defined fluid collection adjacent to the distal phalanx suspicious for small abscess. The distal phalanx is eroded with T2 hyperintensity. These findings are supportive of osteomyelitis in the appropriate clinical context. 3. Stable postsurgical changes from previous amputation through the third metatarsal neck.   Electronically Signed   By: Richardean Sale M.D.   On: 12/13/2014 21:21   Dg Foot Complete Left  12/13/2014   CLINICAL DATA:  Left second toe ulceration.  Initial encounter.  EXAM: LEFT FOOT - COMPLETE 3+ VIEW  COMPARISON:  Radiographs 01/18/2013 and 04/19/2012.  FINDINGS: Previous amputation through the third metatarsal neck. There are chronic arthropathic changes at the first and second metatarsal phalangeal joints with chronic flattening of the second metatarsal head. There is new soft tissue swelling in the second toe with erosion of the distal phalanx. Bone infarct noted in the distal tibia.  IMPRESSION: New soft tissue swelling in the second toe with erosion of the distal phalanx suggesting possible osteomyelitis. No other significant changes identified.   Electronically Signed   By: Richardean Sale M.D.   On: 12/13/2014 18:35   Medications: I have reviewed the patient's current  medications. Scheduled Meds: . aspirin EC  81 mg Oral Daily  . atorvastatin  20 mg Oral q1800  . cholecalciferol  2,000 Units Oral Daily  . enalapril  40 mg Oral Daily  . furosemide  40 mg Oral BID  . insulin aspart  0-20 Units Subcutaneous TID WC  . insulin aspart  0-5 Units Subcutaneous QHS  . insulin glargine  72 Units Subcutaneous BID  . latanoprost  1 drop Both Eyes QHS  . metoprolol tartrate  25 mg Oral BID  . pantoprazole  40 mg Oral Daily   Continuous Infusions: . heparin 2,000 Units/hr (12/13/14 2217)   PRN Meds:.acetaminophen **OR** acetaminophen, albuterol Assessment/Plan: Principal Problem:   Foot ulcer, left Active Problems:   Diabetes type 2, uncontrolled   Dyslipidemia   Diabetic peripheral neuropathy associated with type 2 diabetes mellitus   GERD   Chronic kidney disease, stage III (moderate)   Obesity BMI 46   Hypertension   Atrial fibrillation, new onset   Family history of coronary artery disease in father   Sleep  apnea-on C-pap  #Osteomyelitis: Mr Lazarus has osteomyelitis on MRI of the L second digit. 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 Holding on antibiotics as he is not septic.  -f/u ortho recs -ABI -f/u BCx x 2  #Paroxysmal Atrial Fibrillation: Mr Raben found to be in and out of atrial fibrillation, no prior diagnosis. Cards consulted and started heparin gtt and beta blocker. TTE obtained but not yet read. Troponins negative but cards may want myoview pre-operatively. Risks and benefits of anti-coagulation discussed with cardiology and primary team and he would like to start long-term anti-coagulation. CHADSVASc score pending with cards work-up. -appreciate cards -f/u TTE read -cards considering myoview -heparin gtt, eliquis prior to discharge -lopressor 25 mg bid  #DM2: Hemoglobin A1c 8.4. At home he is on lantus 72 u bid, humalog 50 u breakfast, 30 u lunch, 55 u dinner. He  has history of peripheral neuropathy and background diabetic retinopathy with macular edema. AM CBG 177. -lantus 72 u [place] bid -SSI resistance  #HTN: BP 122/63. At home he is on enalapril 40 mg daily, lasix 40 mg bid, ASA 81 mg daily, and atorvastatin 20 mg daily. -cont enalapril 40 mg daily, lasix 40 mg bid, ASA 81 mg daily, and atorvastatin 20 mg daily  #CKD: Presenting creatinine 1.75, baseline about 1.5. -cont to monitor  #HL: Last lipid profile 07/2014 with total cholester 146, LDL 56, HDL 32, triglyceride 291. At home he is on atorvastatin 20 mg daily.  #OSA: Sleep study 2011. USes CPAP at home -CPAP  #Diet: HH-CM  #DVT PPx: heparin gtt  #Code: Full  Dispo: Disposition is deferred at this time, awaiting improvement of current medical problems.  Anticipated discharge in approximately 2 day(s).   The patient does have a current PCP (Aldine Contes, MD) and does need an [facility] hospital follow-up appointment after discharge.  The patient does not know have transportation limitations that hinder transportation to clinic appointments.  .Services Needed at time of discharge: Y = Yes, Blank = No PT:   OT:   RN:   Equipment:   Other:     LOS: 1 day   Kelby Aline, MD 12/14/2014, 1:59 PM

## 2014-12-15 ENCOUNTER — Inpatient Hospital Stay (HOSPITAL_COMMUNITY): Payer: Medicare Other | Admitting: Anesthesiology

## 2014-12-15 ENCOUNTER — Encounter (HOSPITAL_COMMUNITY)
Admission: AD | Disposition: A | Discharge status: Home / Self Care | Payer: Self-pay | Source: Ambulatory Visit | Attending: Internal Medicine

## 2014-12-15 HISTORY — PX: AMPUTATION: SHX166

## 2014-12-15 LAB — CBC
HCT: 34.9 % — ABNORMAL LOW (ref 39.0–52.0)
HEMOGLOBIN: 11.2 g/dL — AB (ref 13.0–17.0)
MCH: 28.4 pg (ref 26.0–34.0)
MCHC: 32.1 g/dL (ref 30.0–36.0)
MCV: 88.4 fL (ref 78.0–100.0)
PLATELETS: 173 10*3/uL (ref 150–400)
RBC: 3.95 MIL/uL — AB (ref 4.22–5.81)
RDW: 14.3 % (ref 11.5–15.5)
WBC: 6 10*3/uL (ref 4.0–10.5)

## 2014-12-15 LAB — BASIC METABOLIC PANEL
Anion gap: 8 (ref 5–15)
BUN: 17 mg/dL (ref 6–20)
CALCIUM: 9.1 mg/dL (ref 8.9–10.3)
CHLORIDE: 101 mmol/L (ref 101–111)
CO2: 28 mmol/L (ref 22–32)
Creatinine, Ser: 1.71 mg/dL — ABNORMAL HIGH (ref 0.61–1.24)
GFR calc Af Amer: 47 mL/min — ABNORMAL LOW (ref >60)
GFR calc non Af Amer: 41 mL/min — ABNORMAL LOW (ref >60)
Glucose, Bld: 141 mg/dL — ABNORMAL HIGH (ref 65–99)
POTASSIUM: 4.1 mmol/L (ref 3.5–5.1)
SODIUM: 137 mmol/L (ref 135–145)

## 2014-12-15 LAB — HEPARIN LEVEL (UNFRACTIONATED)
Heparin Unfractionated: 0.56 IU/mL (ref 0.30–0.70)
Heparin Unfractionated: 0.33 IU/mL (ref 0.30–0.70)

## 2014-12-15 LAB — GLUCOSE, CAPILLARY
Glucose-Capillary: 140 mg/dL — ABNORMAL HIGH (ref 65–99)
Glucose-Capillary: 121 mg/dL — ABNORMAL HIGH (ref 65–99)
Glucose-Capillary: 122 mg/dL — ABNORMAL HIGH (ref 65–99)
Glucose-Capillary: 188 mg/dL — ABNORMAL HIGH (ref 65–99)

## 2014-12-15 LAB — SURGICAL PCR SCREEN
MRSA, PCR: NEGATIVE
STAPHYLOCOCCUS AUREUS: NEGATIVE

## 2014-12-15 SURGERY — AMPUTATION DIGIT
Anesthesia: General | Laterality: Left

## 2014-12-15 MED ORDER — LIDOCAINE HCL (CARDIAC) 20 MG/ML IV SOLN
INTRAVENOUS | Status: DC | PRN
  Administered 2014-12-15: 60 mg via INTRAVENOUS

## 2014-12-15 MED ORDER — ONDANSETRON HCL 4 MG/2ML IJ SOLN
INTRAMUSCULAR | Status: AC
  Filled 2014-12-15: qty 2

## 2014-12-15 MED ORDER — PROPOFOL 10 MG/ML IV BOLUS
INTRAVENOUS | Status: AC
  Filled 2014-12-15: qty 20

## 2014-12-15 MED ORDER — HYDROMORPHONE HCL 1 MG/ML IJ SOLN: 0.2500 mg | INTRAMUSCULAR | Status: DC | PRN

## 2014-12-15 MED ORDER — LIDOCAINE HCL (CARDIAC) 20 MG/ML IV SOLN
INTRAVENOUS | Status: AC
  Filled 2014-12-15: qty 5

## 2014-12-15 MED ORDER — SODIUM CHLORIDE 0.9 % IV SOLN
INTRAVENOUS | Status: DC
  Administered 2014-12-15 – 2014-12-16 (×3): via INTRAVENOUS

## 2014-12-15 MED ORDER — OXYCODONE HCL 5 MG PO TABS: 5.0000 mg | ORAL_TABLET | Freq: Once | ORAL | Status: AC | PRN

## 2014-12-15 MED ORDER — OXYCODONE HCL 5 MG/5ML PO SOLN: 5.0000 mg | Freq: Once | ORAL | Status: AC | PRN

## 2014-12-15 MED ORDER — SODIUM CHLORIDE 0.9 % IV SOLN: INTRAVENOUS | Status: DC | PRN

## 2014-12-15 MED ORDER — FENTANYL CITRATE (PF) 100 MCG/2ML IJ SOLN
INTRAMUSCULAR | Status: DC | PRN
  Administered 2014-12-15 (×2): 25 ug via INTRAVENOUS

## 2014-12-15 MED ORDER — FENTANYL CITRATE (PF) 250 MCG/5ML IJ SOLN
INTRAMUSCULAR | Status: AC
  Filled 2014-12-15: qty 5

## 2014-12-15 MED ORDER — MIDAZOLAM HCL 2 MG/2ML IJ SOLN
INTRAMUSCULAR | Status: AC
  Filled 2014-12-15: qty 2

## 2014-12-15 MED ORDER — ONDANSETRON HCL 4 MG/2ML IJ SOLN
INTRAMUSCULAR | Status: DC | PRN
  Administered 2014-12-15: 4 mg via INTRAVENOUS

## 2014-12-15 MED ORDER — PHENYLEPHRINE HCL 10 MG/ML IJ SOLN
INTRAMUSCULAR | Status: DC | PRN
  Administered 2014-12-15 (×2): 80 ug via INTRAVENOUS
  Administered 2014-12-15: 40 ug via INTRAVENOUS
  Administered 2014-12-15: 80 ug via INTRAVENOUS

## 2014-12-15 MED ORDER — ROCURONIUM BROMIDE 50 MG/5ML IV SOLN
INTRAVENOUS | Status: AC
  Filled 2014-12-15: qty 1

## 2014-12-15 MED ORDER — PHENYLEPHRINE 40 MCG/ML (10ML) SYRINGE FOR IV PUSH (FOR BLOOD PRESSURE SUPPORT)
PREFILLED_SYRINGE | INTRAVENOUS | Status: AC
  Filled 2014-12-15: qty 10

## 2014-12-15 MED ORDER — PROMETHAZINE HCL 25 MG/ML IJ SOLN: 6.2500 mg | INTRAMUSCULAR | Status: DC | PRN

## 2014-12-15 MED ORDER — PROPOFOL 10 MG/ML IV BOLUS
INTRAVENOUS | Status: DC | PRN
Start: 2014-12-15 — End: 2014-12-15
  Administered 2014-12-15 (×2): 200 mg via INTRAVENOUS

## 2014-12-15 MED ORDER — 0.9 % SODIUM CHLORIDE (POUR BTL) OPTIME
TOPICAL | Status: DC | PRN
  Administered 2014-12-15: 1000 mL

## 2014-12-15 MED ORDER — LACTATED RINGERS IV SOLN
INTRAVENOUS | Status: DC | PRN
  Administered 2014-12-15: 14:00:00 via INTRAVENOUS

## 2014-12-15 MED ORDER — SUCCINYLCHOLINE CHLORIDE 20 MG/ML IJ SOLN
INTRAMUSCULAR | Status: AC
  Filled 2014-12-15: qty 1

## 2014-12-15 MED ORDER — MIDAZOLAM HCL 5 MG/5ML IJ SOLN
INTRAMUSCULAR | Status: DC | PRN
  Administered 2014-12-15: 2 mg via INTRAVENOUS

## 2014-12-15 SURGICAL SUPPLY — 45 items
BANDAGE ELASTIC 3 VELCRO ST LF (GAUZE/BANDAGES/DRESSINGS) ×2 IMPLANT
BANDAGE ELASTIC 4 VELCRO ST LF (GAUZE/BANDAGES/DRESSINGS) ×2 IMPLANT
BNDG CMPR 9X4 STRL LF SNTH (GAUZE/BANDAGES/DRESSINGS) ×1
BNDG COHESIVE 1X5 TAN STRL LF (GAUZE/BANDAGES/DRESSINGS) IMPLANT
BNDG CONFORM 2 STRL LF (GAUZE/BANDAGES/DRESSINGS) IMPLANT
BNDG ESMARK 4X9 LF (GAUZE/BANDAGES/DRESSINGS) ×3 IMPLANT
BNDG GAUZE ELAST 4 BULKY (GAUZE/BANDAGES/DRESSINGS) ×2 IMPLANT
COVER SURGICAL LIGHT HANDLE (MISCELLANEOUS) ×3 IMPLANT
CUFF TOURNIQUET SINGLE 18IN (TOURNIQUET CUFF) IMPLANT
CUFF TOURNIQUET SINGLE 24IN (TOURNIQUET CUFF) IMPLANT
CUFF TOURNIQUET SINGLE 34IN LL (TOURNIQUET CUFF) IMPLANT
CUFF TOURNIQUET SINGLE 44IN (TOURNIQUET CUFF) IMPLANT
DRAPE U-SHAPE 47X51 STRL (DRAPES) ×3 IMPLANT
DRSG ADAPTIC 3X8 NADH LF (GAUZE/BANDAGES/DRESSINGS) ×2 IMPLANT
GAUZE SPONGE 2X2 8PLY STRL LF (GAUZE/BANDAGES/DRESSINGS) IMPLANT
GAUZE SPONGE 4X4 12PLY STRL (GAUZE/BANDAGES/DRESSINGS) IMPLANT
GLOVE BIOGEL PI IND STRL 8 (GLOVE) ×2 IMPLANT
GLOVE BIOGEL PI INDICATOR 8 (GLOVE) ×4
GLOVE ECLIPSE 7.5 STRL STRAW (GLOVE) ×6 IMPLANT
GOWN STRL REUS W/ TWL LRG LVL3 (GOWN DISPOSABLE) ×2 IMPLANT
GOWN STRL REUS W/ TWL XL LVL3 (GOWN DISPOSABLE) ×2 IMPLANT
GOWN STRL REUS W/TWL LRG LVL3 (GOWN DISPOSABLE) ×6
GOWN STRL REUS W/TWL XL LVL3 (GOWN DISPOSABLE) ×6
KIT BASIN OR (CUSTOM PROCEDURE TRAY) ×3 IMPLANT
KIT ROOM TURNOVER OR (KITS) ×3 IMPLANT
MANIFOLD NEPTUNE II (INSTRUMENTS) ×3 IMPLANT
NS IRRIG 1000ML POUR BTL (IV SOLUTION) ×3 IMPLANT
PACK ORTHO EXTREMITY (CUSTOM PROCEDURE TRAY) ×3 IMPLANT
PAD ARMBOARD 7.5X6 YLW CONV (MISCELLANEOUS) ×6 IMPLANT
PAD CAST 4YDX4 CTTN HI CHSV (CAST SUPPLIES) IMPLANT
PADDING CAST COTTON 4X4 STRL (CAST SUPPLIES) ×3
SOLUTION BETADINE 4OZ (MISCELLANEOUS) ×3 IMPLANT
SPECIMEN JAR SMALL (MISCELLANEOUS) ×3 IMPLANT
SPONGE GAUZE 2X2 STER 10/PKG (GAUZE/BANDAGES/DRESSINGS)
SPONGE GAUZE 4X4 12PLY STER LF (GAUZE/BANDAGES/DRESSINGS) ×2 IMPLANT
SPONGE SCRUB IODOPHOR (GAUZE/BANDAGES/DRESSINGS) ×3 IMPLANT
SUCTION FRAZIER TIP 10 FR DISP (SUCTIONS) IMPLANT
SUT ETHILON 2 0 PSLX (SUTURE) ×4 IMPLANT
SUT ETHILON 4 0 PS 2 18 (SUTURE) ×3 IMPLANT
TOWEL OR 17X24 6PK STRL BLUE (TOWEL DISPOSABLE) ×3 IMPLANT
TOWEL OR 17X26 10 PK STRL BLUE (TOWEL DISPOSABLE) ×3 IMPLANT
TUBE CONNECTING 12'X1/4 (SUCTIONS)
TUBE CONNECTING 12X1/4 (SUCTIONS) IMPLANT
UNDERPAD 30X30 INCONTINENT (UNDERPADS AND DIAPERS) ×3 IMPLANT
WATER STERILE IRR 1000ML POUR (IV SOLUTION) ×3 IMPLANT

## 2014-12-15 NOTE — Progress Notes (Signed)
ANTICOAGULATION CONSULT NOTE - Follow Up Consult  Pharmacy Consult for heparin Indication: atrial fibrillation  Allergies  Allergen Reactions  . Vancomycin     REACTION: ARF    Patient Measurements: Height: 6\' 6"  (198.1 cm) Weight: (!) 393 lb 15.4 oz (178.7 kg) IBW/kg (Calculated) : 91.4 Heparin Dosing Weight:   Vital Signs: Temp: 97.9 F (36.6 C) (06/04 0459) Temp Source: Oral (06/04 0459) BP: 122/60 mmHg (06/04 0459) Pulse Rate: 62 (06/04 0459)  Labs:  Recent Labs  12/13/14 2135 12/14/14 0443 12/14/14 1125 12/15/14 0441  HGB 12.1* 11.1*  --  11.2*  HCT 38.1* 34.7*  --  34.9*  PLT 205 194  --  173  APTT 32  --   --   --   LABPROT 14.1  --   --   --   INR 1.07  --   --   --   HEPARINUNFRC  --  0.40 0.34 0.56  CREATININE 1.75*  --   --  1.71*  TROPONINI <0.03 <0.03 <0.03  --     Estimated Creatinine Clearance: 78 mL/min (by C-G formula based on Cr of 1.71).   Medications:  Scheduled:  . aspirin EC  81 mg Oral Daily  . atorvastatin  20 mg Oral q1800  .  ceFAZolin (ANCEF) IV  3 g Intravenous To SSTC  . cholecalciferol  2,000 Units Oral Daily  . enalapril  40 mg Oral Daily  . furosemide  40 mg Oral BID  . insulin aspart  0-20 Units Subcutaneous TID WC  . insulin aspart  0-5 Units Subcutaneous QHS  . insulin glargine  72 Units Subcutaneous BID  . latanoprost  1 drop Both Eyes QHS  . metoprolol tartrate  25 mg Oral BID  . pantoprazole  40 mg Oral Daily   Infusions:  . sodium chloride 75 mL/hr at 12/15/14 0819  . heparin 2,000 Units/hr (12/14/14 2100)    Assessment: 64 yo male with afib is currently on therapeutic heparin. Heparin level today was 0.56. CBC overall is stable. Per RN, patient will go for toe surgery this afternoon.  Goal of Therapy:  Heparin level 0.3-0.7 units/ml Monitor platelets by anticoagulation protocol: Yes   Plan:  Continue Heparin drip 2000 uts/hr for now Daily heparin level and CBC F/u after surgery today on anticoagulation  plan  Jil Penland, Tsz-Yin 12/15/2014,10:16 AM

## 2014-12-15 NOTE — Progress Notes (Signed)
Patient ID: [name], male   DOB: 07-20-1950, 63 y.o.   MRN: [id number]  Cardiology note from Dr Burt Knack reviewed, perioperative recommendations per his note. Tele reviewed, remains in NSR. No further cardiac recommendations at this time.     Carlyle Dolly, M.D.

## 2014-12-15 NOTE — Anesthesia Preprocedure Evaluation (Addendum)
Anesthesia Evaluation  Patient identified by MRN, date of birth, ID band Patient awake    Reviewed: Allergy & Precautions, NPO status , Patient's Chart, lab work & pertinent test results  Airway Mallampati: II  TM Distance: >3 FB Neck ROM: Full    Dental  (+) Edentulous Upper, Edentulous Lower, Dental Advisory Given   Pulmonary asthma , sleep apnea , former smoker,  breath sounds clear to auscultation        Cardiovascular hypertension, Pt. on medications Rhythm:Regular Rate:Normal     Neuro/Psych  Neuromuscular disease    GI/Hepatic Neg liver ROS, GERD-  ,  Endo/Other  diabetes, Type 2, Insulin DependentMorbid obesity  Renal/GU CRF     Musculoskeletal  (+) Arthritis -,   Abdominal   Peds  Hematology  (+) anemia ,   Anesthesia Other Findings   Reproductive/Obstetrics                          Anesthesia Physical Anesthesia Plan  ASA: III  Anesthesia Plan: General   Post-op Pain Management:    Induction: Intravenous  Airway Management Planned: LMA  Additional Equipment:   Intra-op Plan:   Post-operative Plan:   Informed Consent: I have reviewed the patients History and Physical, chart, labs and discussed the procedure including the risks, benefits and alternatives for the proposed anesthesia with the patient or authorized representative who has indicated his/her understanding and acceptance.   Dental advisory given  Plan Discussed with: CRNA, Anesthesiologist and Surgeon  Anesthesia Plan Comments:        Anesthesia Quick Evaluation

## 2014-12-15 NOTE — Transfer of Care (Signed)
Immediate Anesthesia Transfer of Care Note  Patient: [name]  Procedure(s) Performed: Procedure(s): LEFT SECOND TOE AMPUTATION  (Left)  Patient Location: PACU  Anesthesia Type:General  Level of Consciousness: awake  Airway & Oxygen Therapy: Patient Spontanous Breathing and Patient connected to face mask oxygen  Post-op Assessment: Report given to RN and Post -op Vital signs reviewed and stable  Post vital signs: Reviewed and stable  Last Vitals:  Filed Vitals:   12/15/14 0459  BP: 122/60  Pulse: 62  Temp: 36.6 C  Resp: 20    Complications: No apparent anesthesia complications

## 2014-12-15 NOTE — Anesthesia Procedure Notes (Signed)
Procedure Name: LMA Insertion Date/Time: 12/15/2014 2:13 PM Performed by: Garrison Columbus T Pre-anesthesia Checklist: Patient identified, Emergency Drugs available, Suction available and Patient being monitored Patient Re-evaluated:Patient Re-evaluated prior to inductionOxygen Delivery Method: Circle system utilized Preoxygenation: Pre-oxygenation with 100% oxygen Intubation Type: IV induction Ventilation: Oral airway inserted - appropriate to patient size and Two handed mask ventilation required LMA: LMA inserted LMA Size: 5.0 Number of attempts: 1 Placement Confirmation: positive ETCO2 and breath sounds checked- equal and bilateral Tube secured with: Tape Dental Injury: Teeth and Oropharynx as per pre-operative assessment

## 2014-12-15 NOTE — Progress Notes (Signed)
ANTICOAGULATION CONSULT NOTE - Follow Up Consult  Pharmacy Consult for heparin Indication: atrial fibrillation   Labs:  Recent Labs  12/13/14 2135  12/14/14 0443 12/14/14 1125 12/15/14 0441 12/15/14 2211  HGB 12.1*  --  11.1*  --  11.2*  --   HCT 38.1*  --  34.7*  --  34.9*  --   PLT 205  --  194  --  173  --   APTT 32  --   --   --   --   --   LABPROT 14.1  --   --   --   --   --   INR 1.07  --   --   --   --   --   HEPARINUNFRC  --   < > 0.40 0.34 0.56 0.33  CREATININE 1.75*  --   --   --  1.71*  --   TROPONINI <0.03  --  <0.03 <0.03  --   --   < > = values in this interval not displayed.    Assessment/Plan:  64yo male therapeutic on heparin after resumed post-op, at low end of goal though expect to continue to accumulate. Will continue gtt at current rate and confirm stable with am labs.   Wynona Neat, PharmD, BCPS  12/15/2014,10:59 PM

## 2014-12-15 NOTE — Progress Notes (Signed)
Subjective:  VSS.  Pt feeling well this AM.  Slept better last night.  Denies CP, palpitations, SOB, dizziness.  Currently NSR, rate controlled.  Awaiting surgery at ~1:30PM today for left second toe amputation.    Objective: Vital signs in last 24 hours: Filed Vitals:   12/14/14 0506 12/14/14 1344 12/14/14 2303 12/15/14 0459  BP: 137/71 122/63 124/61 122/60  Pulse: 71 60 66 62  Temp: 97.8 F (36.6 C) 97.8 F (36.6 C) 97.8 F (36.6 C) 97.9 F (36.6 C)  TempSrc: Oral Oral Oral Oral  Resp: _0 Height:      Weight: 183 kg (403 lb 7.1 oz)   178.7 kg (393 lb 15.4 oz)  SpO2: 97% 99% 100% 98%   Weight change: -3.646 kg (-8 lb 0.6 oz)  Intake/Output Summary (Last 24 hours) at 12/15/14 1140 Last data filed at 12/15/14 0730  Gross per 24 hour  Intake    704 ml  Output      0 ml  Net    704 ml   Gen: No acute distress, obese HEENT: Atraumatic, PERRL, EOMI, sclerae anicteric, moist mucous membranes Heart: RRR, normal S1 S2, no murmurs, rubs, or gallops Lungs: Clear to auscultation bilaterally, respirations unlabored Abd: Soft, non-tender, non-distended, + bowel sounds, no hepatosplenomegaly Ext: R leg with 1+ pitting edema, L leg with 1+ pitting edema but more swollen/skin tight, some erythema on anterior shin of L leg, 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, 2nd toe with callus on distal plantar surface with open wound and some clear drainage, also callus on metatarsal-phalangeal base Neuro: A&O x 4, CN II-XII intact, finger-nose-finger coordination intact, strength 5/5 and symmetric in all extremities, sensation grossly intact, no Babinkski sign b/l      Lab Results: Basic Metabolic Panel:  Recent Labs Lab 12/13/14 2135 12/15/14 0441  NA 138 137  K 4.8 4.1  CL 103 101  CO2 26 28  GLUCOSE 210* 141*  BUN 17 17  CREATININE 1.75* 1.71*  CALCIUM 9.2 9.1  MG 2.0  --   PHOS 3.4  --    CBC:  Recent Labs Lab 12/13/14 2135 12/14/14 0443 12/15/14 0441  WBC 6.0 6.4  6.0  NEUTROABS 3.4  --   --   HGB 12.1* 11.1* 11.2*  HCT 38.1* 34.7* 34.9*  MCV 89.0 88.3 88.4  PLT 205 194 173   Cardiac Enzymes:  Recent Labs Lab 12/13/14 2135 12/14/14 0443 12/14/14 1125  TROPONINI <0.03 <0.03 <0.03   CBG:  Recent Labs Lab 12/13/14 2121 12/14/14 0801 12/14/14 1159 12/14/14 1730 12/14/14 2305 12/15/14 0753  GLUCAP 161* 177* 239* 223* 199* 140*   Hemoglobin A1C:  Recent Labs Lab 12/13/14 1030  HGBA1C 8.4    Thyroid Function Tests:  Recent Labs Lab 12/13/14 2135 12/14/14 0443  TSH 4.979*  --   FREET4  --  0.82   Coagulation:  Recent Labs Lab 12/13/14 2135  LABPROT 14.1  INR 1.07   Urine Drug Screen: Drugs of Abuse     Component Value Date/Time   LABOPIA NONE DETECTED 12/13/2014 2020   COCAINSCRNUR NONE DETECTED 12/13/2014 2020   LABBENZ NONE DETECTED 12/13/2014 2020   AMPHETMU NONE DETECTED 12/13/2014 2020   THCU NONE DETECTED 12/13/2014 2020   LABBARB NONE DETECTED 12/13/2014 2020    Urinalysis:  Recent Labs Lab 12/13/14 2120  COLORURINE YELLOW  LABSPEC 1.011  PHURINE 6.0  GLUCOSEU NEGATIVE  HGBUR TRACE*  BILIRUBINUR NEGATIVE  KETONESUR NEGATIVE  PROTEINUR NEGATIVE  UROBILINOGEN 1.0  NITRITE NEGATIVE  LEUKOCYTESUR NEGATIVE   ESR 64 CRP 4.7  Micro Results: Recent Results (from the past 240 hour(s))  Culture, blood (routine x 2)     Status: None (Preliminary result)   Collection Time: 12/13/14 11:27 PM  Result Value Ref Range Status   Specimen Description BLOOD LEFT ARM  Final   Special Requests BOTTLES DRAWN AEROBIC AND ANAEROBIC 10CC EACH  Final   Culture   Final           BLOOD CULTURE RECEIVED NO GROWTH TO DATE CULTURE WILL BE HELD FOR 5 DAYS BEFORE ISSUING A FINAL NEGATIVE REPORT Performed at [facility]    Report Status PENDING  Incomplete  Culture, blood (routine x 2)     Status: None (Preliminary result)   Collection Time: 12/13/14 11:34 PM  Result Value Ref Range Status   Specimen  Description BLOOD LEFT HAND  Final   Special Requests BOTTLES DRAWN AEROBIC ONLY 10CC  Final   Culture   Final           BLOOD CULTURE RECEIVED NO GROWTH TO DATE CULTURE WILL BE HELD FOR 5 DAYS BEFORE ISSUING A FINAL NEGATIVE REPORT Performed at [facility]    Report Status PENDING  Incomplete  Surgical pcr screen     Status: None   Collection Time: 12/14/14 10:25 PM  Result Value Ref Range Status   MRSA, PCR NEGATIVE NEGATIVE Final   Staphylococcus aureus NEGATIVE NEGATIVE Final    Comment:        The Xpert SA Assay (FDA approved for NASAL specimens in patients over 5 years of age), is one component of a comprehensive surveillance program.  Test performance has been validated by [facility] for patients greater than or equal to 56 year old. It is not intended to diagnose infection nor to guide or monitor treatment.    Studies/Results: Mr Foot Left Wo Contrast  12/13/2014   CLINICAL DATA:  Second toe ulceration with drainage. History of diabetes and third toe amputation.  EXAM: MRI OF THE LEFT FOREFOOT WITHOUT CONTRAST  TECHNIQUE: Multiplanar, multisequence MR imaging was performed. No intravenous contrast was administered.  COMPARISON:  Radiographs same date.  FINDINGS: Study is moderately motion degraded, especially on the T2 weighted images. In correlation with prior radiographs, there has been previous amputation through the third metatarsal neck.  As noted on today's radiographs, there is abnormality of the distal phalanx of the second toe which appears eroded with T2 hyperintensity. There is adjacent soft tissue T2 hyperintensity extending to the skin, best seen on sagittal images 21 and 22. This measures up to 1.5 cm in diameter and is suspicious for a small abscess. No definite abnormality of the middle or proximal phalanx identified. There is mild soft tissue swelling throughout the second toe.  The other toes appear normal. Arthropathic changes are present at the second  metatarsal phalangeal joint with chronic flattening of the second metatarsal head.  There is dorsal subcutaneous edema throughout the forefoot without focal fluid collection. There appears to be a small amount of fluid within the extensor tendon sheaths.  IMPRESSION: 1. Examination is moderately degraded by motion. 2. Soft tissue swelling in the second toe with ill-defined fluid collection adjacent to the distal phalanx suspicious for small abscess. The distal phalanx is eroded with T2 hyperintensity. These findings are supportive of osteomyelitis in the appropriate clinical context. 3. Stable postsurgical changes from previous amputation through the third metatarsal neck.   Electronically  Signed   By: Richardean Sale M.D.   On: 12/13/2014 21:21   Dg Foot Complete Left  12/13/2014   CLINICAL DATA:  Left second toe ulceration.  Initial encounter.  EXAM: LEFT FOOT - COMPLETE 3+ VIEW  COMPARISON:  Radiographs 01/18/2013 and 04/19/2012.  FINDINGS: Previous amputation through the third metatarsal neck. There are chronic arthropathic changes at the first and second metatarsal phalangeal joints with chronic flattening of the second metatarsal head. There is new soft tissue swelling in the second toe with erosion of the distal phalanx. Bone infarct noted in the distal tibia.  IMPRESSION: New soft tissue swelling in the second toe with erosion of the distal phalanx suggesting possible osteomyelitis. No other significant changes identified.   Electronically Signed   By: Richardean Sale M.D.   On: 12/13/2014 18:35   Medications: I have reviewed the patient's current medications. Scheduled Meds: . aspirin EC  81 mg Oral Daily  . atorvastatin  20 mg Oral q1800  .  ceFAZolin (ANCEF) IV  3 g Intravenous To SSTC  . cholecalciferol  2,000 Units Oral Daily  . enalapril  40 mg Oral Daily  . furosemide  40 mg Oral BID  . insulin aspart  0-20 Units Subcutaneous TID WC  . insulin aspart  0-5 Units Subcutaneous QHS  .  insulin glargine  72 Units Subcutaneous BID  . latanoprost  1 drop Both Eyes QHS  . metoprolol tartrate  25 mg Oral BID  . pantoprazole  40 mg Oral Daily   Continuous Infusions: . sodium chloride 75 mL/hr at 12/15/14 0819  . heparin 2,000 Units/hr (12/15/14 1055)   PRN Meds:.acetaminophen **OR** acetaminophen, albuterol Assessment/Plan: Principal Problem:   Foot ulcer, left Active Problems:   Diabetes type 2, uncontrolled   Dyslipidemia   Diabetic peripheral neuropathy associated with type 2 diabetes mellitus   GERD   Chronic kidney disease, stage III (moderate)   Obesity BMI 46   Hypertension   Atrial fibrillation, new onset   Family history of coronary artery disease in father   Sleep apnea-on C-pap   Acute osteomyelitis of toe of left foot  Left second toe osteomyelitis:  Ortho plans for 2nd left toe amputation today at 1:30pm.  LE dopplers neg for DVT.  Blood cx NGTD.   -f/u ortho recs -ABI pending  -f/u BCx   Paroxysmal Atrial Fibrillation:  No prior h/o.  Currently in NSR with rate controlled.  Cards consulted and started heparin gtt and beta blocker. TTE yesterday showed mild LVH and LVEF of 55%.  Trops neg.  Cards does not think a stress test is indicated because of body habitus has high potential for false+.  Currently on heparin gtt will transition to eliquis upon d/c.   -appreciate cards recs -cont heparin gtt, eliquis prior to discharge -lopressor 25 mg bid  DMII:  -lantus 72 u [place] bid -SSI resistance  HTN:  -cont enalapril 40 mg daily, lasix 40 mg bid, ASA 81 mg daily, and atorvastatin 20 mg daily  CKD:  -cont to monitor  DVT PPx: heparin gtt  Dispo: Disposition is deferred at this time, awaiting improvement of current medical problems.  Anticipated discharge pending surgery recs.     The patient does have a current PCP (Aldine Contes, MD) and does need an [facility] hospital follow-up appointment after discharge.   LOS: 2 days   Jones Bales,  MD 12/15/2014, 11:40 AM

## 2014-12-15 NOTE — Progress Notes (Signed)
Subjective: Pt with continued drainage and fouls smell l 2nd toe   Objective: Vital signs in last 24 hours: Temp:  [97.8 F (36.6 C)-97.9 F (36.6 C)] 97.9 F (36.6 C) (06/04 0459) Pulse Rate:  [62-66] 62 (06/04 0459) Resp:  [14-20] 20 (06/04 0459) BP: (122-124)/(60-61) 122/60 mmHg (06/04 0459) SpO2:  [98 %-100 %] 98 % (06/04 0459) Weight:  [393 lb 15.4 oz (178.7 kg)] 393 lb 15.4 oz (178.7 kg) (06/04 0459)  Intake/Output from previous day: 06/03 0701 - 06/04 0700 In: 1184 [P.O.:960; I.V.:224] Out: -  Intake/Output this shift:     Recent Labs  12/13/14 2135 12/14/14 0443 12/15/14 0441  HGB 12.1* 11.1* 11.2*    Recent Labs  12/14/14 0443 12/15/14 0441  WBC 6.4 6.0  RBC 3.93* 3.95*  HCT 34.7* 34.9*  PLT 194 173    Recent Labs  12/13/14 2135 12/15/14 0441  NA 138 137  K 4.8 4.1  CL 103 101  CO2 26 28  BUN 17 17  CREATININE 1.75* 1.71*  GLUCOSE 210* 141*  CALCIUM 9.2 9.1    Recent Labs  12/13/14 2135  INR 1.07    ABD soft Intact pulses distally open wound l 2nd toe with mild sts over foot  Assessment/Plan: 64 yo male with open wound and osteo of 2nd toe.  Pt on heparin drip for new onset a-fib.// plan amputation 2nd toe and will keep on heparin to minimize overall risk.   Hezikiah Retzloff L 12/15/2014, 1:57 PM

## 2014-12-15 NOTE — Anesthesia Postprocedure Evaluation (Signed)
  Anesthesia Post-op Note  Patient: [name]  Procedure(s) Performed: Procedure(s): LEFT SECOND TOE AMPUTATION  (Left)  Patient Location: PACU  Anesthesia Type:General  Level of Consciousness: awake and alert   Airway and Oxygen Therapy: Patient Spontanous Breathing  Post-op Pain: none  Post-op Assessment: Post-op Vital signs reviewed  Post-op Vital Signs: Reviewed  Last Vitals:  Filed Vitals:   12/15/14 1643  BP: 132/78  Pulse: 62  Temp: 36.5 C  Resp: 18    Complications: No apparent anesthesia complications

## 2014-12-15 NOTE — Progress Notes (Signed)
Internal Medicine Attending  Date: 12/15/2014  Patient name: [name] Medical record number: [id number] Date of birth: [birth year] Age: 64 y.o. Gender: male  I saw and evaluated the patient pre-operatively. I reviewed the resident's note by Dr. Gordy Levan and I agree with the resident's findings and plans as documented in her progress note.  [name] underwent a left 2nd toe amputation for osteomyelitis this afternoon.  Will reassess status in AM.

## 2014-12-15 NOTE — Op Note (Signed)
[name], [name]            ACCOUNT NO.:  [id number]  MEDICAL RECORD NO.:  [id number]  LOCATION:  5W03C                        FACILITY:  [facility]  PHYSICIAN:  Alta Corning, M.D.   DATE OF BIRTH:  May 06, 1951  DATE OF PROCEDURE:  12/15/2014 DATE OF DISCHARGE:                              OPERATIVE REPORT   PREOPERATIVE DIAGNOSIS:  Osteomyelitis, second toe.  POSTOPERATIVE DIAGNOSIS:  Osteomyelitis, second toe.  PROCEDURE:  Amputation, second toe at the metatarsophalangeal joint.  SURGEON:  Alta Corning, M.D.  ASSISTANT:  Gary Fleet, P.A.  ANESTHESIA:  General.  BRIEF HISTORY:  [name] is a 64 year old male with a history of significant complaints of swelling and foul-smelling drainage from his second toe.  He was admitted to the Medical Service, had new onset AFib, for which he was started on a heparin drip.  We evaluated and felt that he had a toe that needed to be amputated because of chronic osteomyelitis with inability to be treated nonoperatively.  He was brought to the operating room for this procedure.  The patient was placed on a heparin drip preoperatively and felt it was probably appropriate just to keep him on that given that we would be able to control his bleeding if there are any significant issues intraoperatively at the toe.  He was brought to the operating room for amputation.  DESCRIPTION OF PROCEDURE:  The patient was brought to the operating room after adequate anesthesia was obtained with general anesthetic.  The patient was placed supine on the operating table.  The left foot was then prepped and draped in usual sterile fashion.  Following this, a curved incision was made, racket style over the second toe subcutaneous tissue down the level of the toe.  We dissected back to the metatarsophalangeal joint.  Did an amputation, excised the extensor and flexor tendons, and irrigated the wound thoroughly, closed it with nylon interrupted  sutures.  Sterile compressive dressing was applied.  The patient was taken to the recovery room, he was noted to be in satisfactory condition.  Estimated blood loss for the procedure was minimal.     Alta Corning, M.D.     [name]  D:  12/15/2014  T:  12/15/2014  Job:  [id number]

## 2014-12-15 NOTE — Progress Notes (Signed)
Utilization Review completed. Levie Wages RN BSN CM 

## 2014-12-15 NOTE — Brief Op Note (Signed)
12/13/2014 - 12/15/2014  2:43 PM  PATIENT:  [name]  64 y.o. male  PRE-OPERATIVE DIAGNOSIS:  OSTEOMYLITIS LEFT SECOND TOE  POST-OPERATIVE DIAGNOSIS:  OSTEOMYLITIS LEFT SECOND TOE  PROCEDURE:  Procedure(s): LEFT SECOND TOE AMPUTATION  (Left)  SURGEON:  Surgeon(s) and Role:    * Dorna Leitz, MD - Primary  PHYSICIAN ASSISTANT:   ASSISTANTS: bethune   ANESTHESIA:   general  EBL:  Total I/O In: 531 [I.V.:531] Out: 500 [Urine:500]  BLOOD ADMINISTERED:none  DRAINS: none   LOCAL MEDICATIONS USED:  NONE  SPECIMEN:  Source of Specimen:  2nd toe  DISPOSITION OF SPECIMEN:  PATHOLOGY  COUNTS:  YES  TOURNIQUET:    DICTATION: .Other Dictation: Dictation Number [id number]  PLAN OF CARE: Admit to inpatient   PATIENT DISPOSITION:  PACU - hemodynamically stable.   Delay start of Pharmacological VTE agent (>24hrs) due to surgical blood loss or risk of bleeding: no

## 2014-12-15 NOTE — Progress Notes (Signed)
Pt states that CPAP is helping him and that he doesn't need sleep medcation. Instructed pt to call nurse if he needs something for sleep and RN will page MD again. Pt stated understanding. Will continue to monitor pt. Ranelle Oyster, RN

## 2014-12-16 DIAGNOSIS — M86172 Other acute osteomyelitis, left ankle and foot: Secondary | ICD-10-CM

## 2014-12-16 LAB — BASIC METABOLIC PANEL
Anion gap: 8 (ref 5–15)
BUN: 16 mg/dL (ref 6–20)
CHLORIDE: 100 mmol/L — AB (ref 101–111)
CO2: 29 mmol/L (ref 22–32)
Calcium: 8.6 mg/dL — ABNORMAL LOW (ref 8.9–10.3)
Creatinine, Ser: 1.87 mg/dL — ABNORMAL HIGH (ref 0.61–1.24)
GFR calc non Af Amer: 36 mL/min — ABNORMAL LOW (ref >60)
GFR, EST AFRICAN AMERICAN: 42 mL/min — AB (ref >60)
Glucose, Bld: 117 mg/dL — ABNORMAL HIGH (ref 65–99)
Potassium: 4.1 mmol/L (ref 3.5–5.1)
Sodium: 137 mmol/L (ref 135–145)

## 2014-12-16 LAB — CBC
HCT: 35.2 % — ABNORMAL LOW (ref 39.0–52.0)
HEMOGLOBIN: 11.1 g/dL — AB (ref 13.0–17.0)
MCH: 28 pg (ref 26.0–34.0)
MCHC: 31.5 g/dL (ref 30.0–36.0)
MCV: 88.9 fL (ref 78.0–100.0)
PLATELETS: 190 10*3/uL (ref 150–400)
RBC: 3.96 MIL/uL — ABNORMAL LOW (ref 4.22–5.81)
RDW: 14.5 % (ref 11.5–15.5)
WBC: 7 10*3/uL (ref 4.0–10.5)

## 2014-12-16 LAB — GLUCOSE, CAPILLARY
GLUCOSE-CAPILLARY: 89 mg/dL (ref 65–99)
Glucose-Capillary: 159 mg/dL — ABNORMAL HIGH (ref 65–99)
Glucose-Capillary: 245 mg/dL — ABNORMAL HIGH (ref 65–99)

## 2014-12-16 LAB — HEPARIN LEVEL (UNFRACTIONATED): Heparin Unfractionated: 0.43 IU/mL (ref 0.30–0.70)

## 2014-12-16 MED ORDER — METOPROLOL TARTRATE 25 MG PO TABS: 25.0000 mg | ORAL_TABLET | Freq: Two times a day (BID) | ORAL | Status: DC

## 2014-12-16 MED ORDER — APIXABAN 5 MG PO TABS
5.0000 mg | ORAL_TABLET | Freq: Two times a day (BID) | ORAL | Status: DC
  Administered 2014-12-16: 5 mg via ORAL
  Filled 2014-12-16 (×2): qty 1

## 2014-12-16 MED ORDER — APIXABAN 5 MG PO TABS: 5.0000 mg | ORAL_TABLET | Freq: Two times a day (BID) | ORAL | Status: DC

## 2014-12-16 MED ORDER — AMOXICILLIN-POT CLAVULANATE 875-125 MG PO TABS
1.0000 | ORAL_TABLET | Freq: Two times a day (BID) | ORAL | Status: DC
  Administered 2014-12-16: 1 via ORAL
  Filled 2014-12-16 (×2): qty 1

## 2014-12-16 MED ORDER — AMOXICILLIN-POT CLAVULANATE 875-125 MG PO TABS: 1.0000 | ORAL_TABLET | Freq: Two times a day (BID) | ORAL | Status: DC

## 2014-12-16 NOTE — Progress Notes (Signed)
Patient ID: [name], male   DOB: April 05, 1951, 64 y.o.   MRN: [id number]   Telemetry reviewed, remains in NSR. CHADS2Vasc 2, will be 3 in August. Recommend starting anticoagulation when ok from surgical standpoint, I would recommend eliquis 5mg  bid. Will sign off inpatient cardiac care, call if needed.       Arnoldo Lenis, M.D.

## 2014-12-16 NOTE — Progress Notes (Signed)
Internal Medicine Attending  Date: 12/16/2014  Patient name: [name] Medical record number: [id number] Date of birth: 12-09-1950 Age: 64 y.o. Gender: male  I saw and evaluated the patient. I reviewed the resident's note by Dr. Gordy Levan and I agree with the resident's findings and plans as documented in her progress note.  Doing well post-op with minimal pain.  Bandage clean and dry.  Agree with starting eliquis for anticoagulation and discharge to home today.  Follow-up with Ortho and the [facility].

## 2014-12-16 NOTE — Care Management Note (Addendum)
Case Management Note  Patient Details  Name: KENDRIC SINDELAR MRN: [id number] Date of Birth: 1950/12/23  Subjective/Objective:                   Foot ulcer  Action/Plan:  Discharge planning Expected Discharge Date:  12/16/14              Expected Discharge Plan:  Home/Self Care  In-House Referral:     Discharge planning Services  CM Consult  Post Acute Care Choice:  Durable Medical Equipment Choice offered to:  Patient  DME Arranged:  Kasandra Knudsen DME Agency:  [place]:    [facility] Agency:   Status of Service:  Completed, signed off  Medicare Important Message Given:    Date Medicare IM Given:    Medicare IM give by:    Date Additional Medicare IM Given:    Additional Medicare Important Message give by:     If discussed at [place] of Stay Meetings, dates discussed:    Additional Comments: CM called and spoke to [facility] rep Jeneen Rinks and requested cane to patient's room. PT did not recommend further needs or follow up. CM provided patient with Free-30 day eliquis card and explained filling the RX and patient verbalized understanding. Patient denies further home needs.  No additional CM needs communicated.  Guido Sander, RN 12/16/2014, 1:47 PM

## 2014-12-16 NOTE — Discharge Summary (Signed)
Name: [name] MRN: [id number] DOB: 08/06/1950 64 y.o. PCP: Aldine Contes, MD ______________________________________________________________  Date of Admission: 12/13/2014 12:42 PM Date of Discharge: 12/16/2014 Attending Physician: Oval Linsey, MD  Discharge Diagnosis:  L 2nd toe osteomyelitis s/p amputation Paroxysmal atrial fibrillation DM2 HTN CKD HL OSA  Discharge Medications:   Medication List    TAKE these medications        ACCU-CHEK AVIVA PLUS test strip  Generic drug:  glucose blood  USE TO CHECK BLOOD SUGAR FOUR TIMES DAILY BEFORE MEALS AND AT BEDTIME     ACCU-CHEK AVIVA PLUS W/DEVICE Kit  1 each by Does not apply route 4 (four) times daily -  with meals and at bedtime.     ACCU-CHEK FASTCLIX LANCETS Misc  Check blood sugar 4 times a day before meals and bedtime dx code 250.02 insulin requiring     albuterol 108 (90 BASE) MCG/ACT inhaler  Commonly known as:  PROAIR HFA  Inhale 2 puffs into the lungs every 6 (six) hours as needed for wheezing or shortness of breath.     amoxicillin-clavulanate 875-125 MG per tablet  Commonly known as:  AUGMENTIN  Take 1 tablet by mouth every 12 (twelve) hours.     apixaban 5 MG Tabs tablet  Commonly known as:  ELIQUIS  Take 1 tablet (5 mg total) by mouth 2 (two) times daily.     aspirin 81 MG EC tablet  Take 1 tablet (81 mg total) by mouth daily.     atorvastatin 20 MG tablet  Commonly known as:  LIPITOR  Take 1 tablet (20 mg total) by mouth daily.     BESIVANCE 0.6 % Susp  Generic drug:  Besifloxacin HCl  Place 1 drop into both eyes 4 (four) times daily.     capsaicin 0.025 % cream  Commonly known as:  ZOSTRIX  Apply 1 application topically daily as needed (for pain).     docusate sodium 100 MG capsule  Commonly known as:  COLACE  Take 1 capsule (100 mg total) by mouth 2 (two) times daily.     enalapril 20 MG tablet  Commonly known as:  VASOTEC  Take 2 tablets (40 mg total) by mouth daily.      furosemide 20 MG tablet  Commonly known as:  LASIX  Take 2 tablets (40 mg total) by mouth 2 (two) times daily.     HYDROcodone-acetaminophen 5-325 MG per tablet  Commonly known as:  NORCO/VICODIN  Take 1 and 1/2 tablets by mouth every 6 hours as needed for pain     Insulin Glargine 100 UNIT/ML Solostar Pen  Commonly known as:  LANTUS SOLOSTAR  Inject 72 Units into the skin 2 (two) times daily.     insulin lispro 100 UNIT/ML KiwkPen  Commonly known as:  HUMALOG KWIKPEN  Inject 50 units before breakfast, 30 units before lunch, and 55 units before dinner, plus 1 unit for every 10 mg/dl above 100 mg/dl.     Insulin Pen Needle 31G X 5 MM Misc  Use for insulin injection 5 times a day.     LUMIGAN 0.01 % Soln  Generic drug:  bimatoprost  Place 1 drop into both eyes at bedtime.     metoprolol tartrate 25 MG tablet  Commonly known as:  LOPRESSOR  Take 1 tablet (25 mg total) by mouth 2 (two) times daily.     omeprazole 20 MG capsule  Commonly known as:  PRILOSEC  TAKE 1 CAPSULE DAILY  promethazine 25 MG tablet  Commonly known as:  PHENERGAN  Take 0.5-1 tablets (12.5-25 mg total) by mouth daily as needed for nausea.     SIMBRINZA 1-0.2 % Susp  Generic drug:  Brinzolamide-Brimonidine  Place 1 drop into both eyes 2 (two) times daily. Take as instructed by Dr. Katy Fitch.     Vitamin D3 2000 UNITS capsule  Take 1 capsule (2,000 Units total) by mouth daily.        Disposition and follow-up:   [name] was discharged from [facility] in Good condition to home.  Please address the following problems post-discharge:  1. Wound care, glycemic control, atrial fibrillation v NSR, antihypertensive regimen  Labs / imaging needed at time of follow-up: BMP  Pending labs/ test needing follow-up: None   Follow-up Appointments: Follow-up Information    Follow up with GRAVES,JOHN L, MD. Schedule an appointment as soon as possible for a visit in 2 weeks.     Specialty:  Orthopedic Surgery   Why:  Surgery follow-up appointment   Contact information:   [place] [address] [phone]       Follow up with [facility].   Why:  Internal Medicine Clinic will call you to schedule   Contact information:   [address] [place] [phone]      Discharge Instructions: Discharge Instructions    Call MD for:  extreme fatigue    Complete by:  As directed      Call MD for:  redness, tenderness, or signs of infection (pain, swelling, redness, odor or green/yellow discharge around incision site)    Complete by:  As directed      Call MD for:  severe uncontrolled pain    Complete by:  As directed      Call MD for:  temperature >100.4    Complete by:  As directed      Diet - low sodium heart healthy    Complete by:  As directed      Discharge instructions    Complete by:  As directed   Follow up with Dr. Berenice Primas who did your surgery in 2 weeks.  Please continue to take your antibiotic twice daily for 5 days then stop.  You have also been prescribed eliquis twice daily for your atrial fibrillation.  Please take this twice daily everyday.  Both of these prescriptions have been sent electronically to [facility].  The internal medicine clinic will call to schedule you a follow-up appointment for next week.     Discharge wound care:    Complete by:  As directed   Follow up with Dr. Berenice Primas in 2 weeks.  Continue augmentin (antibiotic) twice daily for 5 days.     Increase activity slowly    Complete by:  As directed           Procedures Performed:  Patrick Foot Left Wo Contrast  12/13/2014   CLINICAL DATA:  Second toe ulceration with drainage. History of diabetes and third toe amputation.  EXAM: MRI OF THE LEFT FOREFOOT WITHOUT CONTRAST  TECHNIQUE: Multiplanar, multisequence Patrick imaging was performed. No intravenous contrast was administered.  COMPARISON:  Radiographs same date.   FINDINGS: Study is moderately motion degraded, especially on the T2 weighted images. In correlation with prior radiographs, there has been previous amputation through the third metatarsal neck.  As noted on today's radiographs, there is abnormality of the distal phalanx of the second  toe which appears eroded with T2 hyperintensity. There is adjacent soft tissue T2 hyperintensity extending to the skin, best seen on sagittal images 21 and 22. This measures up to 1.5 cm in diameter and is suspicious for a small abscess. No definite abnormality of the middle or proximal phalanx identified. There is mild soft tissue swelling throughout the second toe.  The other toes appear normal. Arthropathic changes are present at the second metatarsal phalangeal joint with chronic flattening of the second metatarsal head.  There is dorsal subcutaneous edema throughout the forefoot without focal fluid collection. There appears to be a small amount of fluid within the extensor tendon sheaths.  IMPRESSION: 1. Examination is moderately degraded by motion. 2. Soft tissue swelling in the second toe with ill-defined fluid collection adjacent to the distal phalanx suspicious for small abscess. The distal phalanx is eroded with T2 hyperintensity. These findings are supportive of osteomyelitis in the appropriate clinical context. 3. Stable postsurgical changes from previous amputation through the third metatarsal neck.   Electronically Signed   By: Richardean Sale M.D.   On: 12/13/2014 21:21   Dg Foot Complete Left  12/13/2014   CLINICAL DATA:  Left second toe ulceration.  Initial encounter.  EXAM: LEFT FOOT - COMPLETE 3+ VIEW  COMPARISON:  Radiographs 01/18/2013 and 04/19/2012.  FINDINGS: Previous amputation through the third metatarsal neck. There are chronic arthropathic changes at the first and second metatarsal phalangeal joints with chronic flattening of the second metatarsal head. There is new soft tissue swelling in the second toe  with erosion of the distal phalanx. Bone infarct noted in the distal tibia.  IMPRESSION: New soft tissue swelling in the second toe with erosion of the distal phalanx suggesting possible osteomyelitis. No other significant changes identified.   Electronically Signed   By: Richardean Sale M.D.   On: 12/13/2014 18:35    2D Echo:  Date: 12/14/14 Conclusions: - Left ventricle: Technically limited study. The cavity size was normal. Wall thickness was increased in a pattern of mild LVH. The estimated ejection fraction was 55%. Regional wall motion abnormalities cannot be excluded. - Aorta: Slight dilitation of the root. Aortic root dimension: 40 mm (ED). - Left atrium: The atrium was moderately dilated. - Right ventricle: The cavity size was mildly dilated. Systolic function was normal.  LLE Doppler Date 12/13/14 Summary:  - No evidence of deep vein thrombosis involving the left lower extremity. - No evidence of Baker&'s cyst on the left.  Admission HPI:  [name] is a 64 year old man with DM2 s/p L 3rd toe amputation, HTN, CKD, HL, OSA who presents with worsening toe ulcer. He was seen in [facility] today for darkening of his L second toe over the past week. He has noticed some bleeding from the lesion but no pus drainage. He denies any pain but has no feeling from his mid-calf down on his L leg chronically. He also thinks that his L leg has gotten progressively swollen. He denies any recent immobilization such as traveling, history of blood clots, does not smoke. In clinic, he had no sensation in his L foot and difficulty palpating distal pulses in that foot so he was directly admitted for plantar eschar evaluation and treatment. He denies any fevers, chills, night sweats, chest pain, SOB, palpitations, abdominal pain, nausea, emesis, diarrhea, constipation, dysuria, weakness.  After initial assessment, RN called at 5 pm that he had chest pain and she obtained EKG. He says he had episode  of pain that starts  in his L arm and radiates to his L chest, no other radiation. He says it is sharp. It has associated lightheadedness. He has had episodes in the past but no prior work-up. He denies any reflex, drug use.  Hospital Course by problem list: Principal Problem:   Acute osteomyelitis of toe of left foot Active Problems:   Diabetes type 2, uncontrolled   Dyslipidemia   Diabetic peripheral neuropathy associated with type 2 diabetes mellitus   GERD   Chronic kidney disease, stage III (moderate)   Obesity BMI 46   Hypertension   Foot ulcer, left   Paroxysmal atrial fibrillation, new onset   Family history of coronary artery disease in father   Sleep apnea-on C-pap   #Osteomyelitis: [name] had osteomyelitis on MRI of the L second digit. He had amputation of L 3rd toe by Dr Frederik Pear in 2007 so [facility] was called for consult. They recommended digit amputation, performed 10/19/68 no complication. He did not receive no pre-operative antibiotics given he was not septic. L leg was swollen but Doppler negative for DVT.Following stable post-operative course, he was started on a 5 day course of augmentin. He will follow-up with orthopedics in two weeks.  #Paroxysmal Atrial Fibrillation: [name] found to be in and out of atrial fibrillation, no prior diagnosis. Troponins negative. Cards consulted and started heparin gtt and beta blocker. TTE obtained and notable for EF 55%. Cardiology did not want a pre-operative stress test given his habitus and high rate of false positives.  Risks and benefits of anti-coagulation discussed with cardiology and primary team and he would like to start long-term anti-coagulation. He was started on eliquis prior to discharge and continued on lopressor 25 mg bid.   #DM2: Hemoglobin A1c 8.4 on [facility] visit the day of discharge. At home he is on lantus 72 u bid, humalog 50 u breakfast, 30 u lunch, 55 u dinner. He has history of peripheral  neuropathy and background diabetic retinopathy with macular edema. He had his home lantus and a resistant SSI during hospitalization but was discharged on his home regimen.  #HTN: BP 122/63. At home he was on enalapril 40 mg daily, lasix 40 mg bid, ASA 81 mg daily, and atorvastatin 20 mg daily. Lopressor 25 mg bid was started but rate control due to paroxysmal atrial fibrillation so his hydralazine was held on discharge.  #CKD: Presenting creatinine 1.75, baseline about 1.5. Creatinine was 1.87 prior to discharge. This should be rechecked by PCP.  #HL: Last lipid profile 07/2014 with total cholester 146, LDL 56, HDL 32, triglyceride 291. At home he is on atorvastatin 20 mg daily which was continued.  #OSA: Sleep study 2011. USes CPAP at home which was continued.  Discharge Vitals:   BP 118/55 mmHg  Pulse 60  Temp(Src) 98 F (36.7 C) (Oral)  Resp 18  Ht $R'6\' 6"'rS$  (1.981 m)  Wt 393 lb 8.3 oz (178.5 kg)  BMI 45.49 kg/m2  SpO2 97%  Discharge Physical Exam: Gen: No acute distress, obese HEENT: Atraumatic, PERRL, EOMI, sclerae anicteric, moist mucous membranes Heart: RRR, normal S1 S2, no murmurs, rubs, or gallops Lungs: Clear to auscultation bilaterally, respirations unlabored Abd: Soft, non-tender, non-distended, +BS, no hepatosplenomegaly Ext: R leg 1+ pitting edema, L leg 1+ pitting edema but more swollen/skin tight, some erythema on anterior shin of L leg, 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; L foot covered with ACE bandage Neuro: A&O x 4, CN II-XII intact  Discharge Labs:  Basic Metabolic Panel:  Recent Labs Lab 12/13/14 2135 12/15/14 0441 12/16/14 0345  NA 138 137 137  K 4.8 4.1 4.1  CL 103 101 100*  CO2 $Re'26 28 29  'JsW$ GLUCOSE 210* 141* 117*  BUN $Re'17 17 16  'UjZ$ CREATININE 1.75* 1.71* 1.87*  CALCIUM 9.2 9.1 8.6*  MG 2.0  --   --   PHOS 3.4  --   --    CBC:  Recent Labs Lab 12/13/14 2135  12/15/14 0441 12/16/14 0345  WBC 6.0  < > 6.0 7.0  NEUTROABS 3.4  --   --   --   HGB 12.1*  <  > 11.2* 11.1*  HCT 38.1*  < > 34.9* 35.2*  MCV 89.0  < > 88.4 88.9  PLT 205  < > 173 190  < > = values in this interval not displayed. Cardiac Enzymes:  Recent Labs Lab 12/13/14 2135 12/14/14 0443 12/14/14 1125  TROPONINI <0.03 <0.03 <0.03   CBG:  Recent Labs Lab 12/15/14 1500 12/15/14 1539 12/15/14 2158 12/16/14 0750 12/16/14 1018 12/16/14 1140  GLUCAP 109* 122* 188* 89 159* 245*   Hemoglobin A1C:  Recent Labs Lab 12/13/14 1030  HGBA1C 8.4   Thyroid Function Tests:  Recent Labs Lab 12/13/14 2135 12/14/14 0443  TSH 4.979*  --   FREET4  --  0.82   Coagulation:  Recent Labs Lab 12/13/14 2135  LABPROT 14.1  INR 1.07   Urine Drug Screen: Drugs of Abuse     Component Value Date/Time   LABOPIA NONE DETECTED 12/13/2014 2020   COCAINSCRNUR NONE DETECTED 12/13/2014 2020   LABBENZ NONE DETECTED 12/13/2014 2020   AMPHETMU NONE DETECTED 12/13/2014 2020   THCU NONE DETECTED 12/13/2014 2020   LABBARB NONE DETECTED 12/13/2014 2020    Urinalysis:  Recent Labs Lab 12/13/14 2120  COLORURINE YELLOW  LABSPEC 1.011  PHURINE 6.0  GLUCOSEU NEGATIVE  HGBUR TRACE*  BILIRUBINUR NEGATIVE  KETONESUR NEGATIVE  PROTEINUR NEGATIVE  UROBILINOGEN 1.0  NITRITE NEGATIVE  LEUKOCYTESUR NEGATIVE   Signed: Kelby Aline, MD 12/17/2014, 8:51 AM   Services Ordered on Discharge: None Equipment Ordered on Discharge: [employer]

## 2014-12-16 NOTE — Progress Notes (Signed)
Subjective:  VSS.  Pt feeling well this AM s/p left 2nd toe amputation.  Denies any pain.  No CP, palpitations, SOB, dizziness.  Currently NSR, rate controlled.    Objective: Vital signs in last 24 hours: Filed Vitals:   12/15/14 1643 12/15/14 2201 12/15/14 2301 12/16/14 0530  BP: 132/78 119/61  125/63  Pulse: 62 69 72 66  Temp: 97.7 F (36.5 C) 97.9 F (36.6 C)  98.1 F (36.7 C)  TempSrc: Oral Oral  Oral  Resp: $Remo'18 18 18 20  'JmmjP$ Height:      Weight:    178.5 kg (393 lb 8.3 oz)  SpO2: 95% 99% 98% 99%   Weight change: -0.2 kg (-7.1 oz)  Intake/Output Summary (Last 24 hours) at 12/16/14 1138 Last data filed at 12/16/14 0850  Gross per 24 hour  Intake   2270 ml  Output   1600 ml  Net    670 ml   Gen: No acute distress, obese HEENT: Atraumatic, PERRL, EOMI, sclerae anicteric, moist mucous membranes Heart: RRR, normal S1 S2, no murmurs, rubs, or gallops Lungs: Clear to auscultation bilaterally, respirations unlabored Abd: Soft, non-tender, non-distended, +BS, no hepatosplenomegaly Ext: R leg 1+ pitting edema, L leg 1+ pitting edema but more swollen/skin tight, some erythema on anterior shin of L leg, 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; L foot covered with ACE bandage Neuro: A&O x 4, CN II-XII intact      Lab Results: Basic Metabolic Panel:  Recent Labs Lab 12/13/14 2135 12/15/14 0441 12/16/14 0345  NA 138 137 137  K 4.8 4.1 4.1  CL 103 101 100*  CO2 $Re'26 28 29  'Spt$ GLUCOSE 210* 141* 117*  BUN $Re'17 17 16  'RZe$ CREATININE 1.75* 1.71* 1.87*  CALCIUM 9.2 9.1 8.6*  MG 2.0  --   --   PHOS 3.4  --   --    CBC:  Recent Labs Lab 12/13/14 2135  12/15/14 0441 12/16/14 0345  WBC 6.0  < > 6.0 7.0  NEUTROABS 3.4  --   --   --   HGB 12.1*  < > 11.2* 11.1*  HCT 38.1*  < > 34.9* 35.2*  MCV 89.0  < > 88.4 88.9  PLT 205  < > 173 190  < > = values in this interval not displayed. Cardiac Enzymes:  Recent Labs Lab 12/13/14 2135 12/14/14 0443 12/14/14 1125  TROPONINI <0.03 <0.03  <0.03   CBG:  Recent Labs Lab 12/14/14 2305 12/15/14 0753 12/15/14 1150 12/15/14 1539 12/15/14 2158 12/16/14 0750  GLUCAP 199* 140* 121* 122* 188* 89   Hemoglobin A1C:  Recent Labs Lab 12/13/14 1030  HGBA1C 8.4    Thyroid Function Tests:  Recent Labs Lab 12/13/14 2135 12/14/14 0443  TSH 4.979*  --   FREET4  --  0.82   Coagulation:  Recent Labs Lab 12/13/14 2135  LABPROT 14.1  INR 1.07   Urine Drug Screen: Drugs of Abuse     Component Value Date/Time   LABOPIA NONE DETECTED 12/13/2014 2020   COCAINSCRNUR NONE DETECTED 12/13/2014 2020   LABBENZ NONE DETECTED 12/13/2014 2020   AMPHETMU NONE DETECTED 12/13/2014 2020   THCU NONE DETECTED 12/13/2014 2020   LABBARB NONE DETECTED 12/13/2014 2020    Urinalysis:  Recent Labs Lab 12/13/14 2120  COLORURINE YELLOW  LABSPEC 1.011  PHURINE 6.0  GLUCOSEU NEGATIVE  HGBUR TRACE*  BILIRUBINUR NEGATIVE  KETONESUR NEGATIVE  PROTEINUR NEGATIVE  UROBILINOGEN 1.0  NITRITE NEGATIVE  LEUKOCYTESUR NEGATIVE   ESR 64  CRP 4.7  Micro Results: Recent Results (from the past 240 hour(s))  Culture, blood (routine x 2)     Status: None (Preliminary result)   Collection Time: 12/13/14 11:27 PM  Result Value Ref Range Status   Specimen Description BLOOD LEFT ARM  Final   Special Requests BOTTLES DRAWN AEROBIC AND ANAEROBIC 10CC EACH  Final   Culture   Final           BLOOD CULTURE RECEIVED NO GROWTH TO DATE CULTURE WILL BE HELD FOR 5 DAYS BEFORE ISSUING A FINAL NEGATIVE REPORT Performed at [facility]    Report Status PENDING  Incomplete  Culture, blood (routine x 2)     Status: None (Preliminary result)   Collection Time: 12/13/14 11:34 PM  Result Value Ref Range Status   Specimen Description BLOOD LEFT HAND  Final   Special Requests BOTTLES DRAWN AEROBIC ONLY 10CC  Final   Culture   Final           BLOOD CULTURE RECEIVED NO GROWTH TO DATE CULTURE WILL BE HELD FOR 5 DAYS BEFORE ISSUING A FINAL NEGATIVE  REPORT Performed at [facility]    Report Status PENDING  Incomplete  Surgical pcr screen     Status: None   Collection Time: 12/14/14 10:25 PM  Result Value Ref Range Status   MRSA, PCR NEGATIVE NEGATIVE Final   Staphylococcus aureus NEGATIVE NEGATIVE Final    Comment:        The Xpert SA Assay (FDA approved for NASAL specimens in patients over 47 years of age), is one component of a comprehensive surveillance program.  Test performance has been validated by [facility] for patients greater than or equal to 79 year old. It is not intended to diagnose infection nor to guide or monitor treatment.    Studies/Results: No results found. Medications: I have reviewed the patient's current medications. Scheduled Meds: . amoxicillin-clavulanate  1 tablet Oral Q12H  . apixaban  5 mg Oral BID  . aspirin EC  81 mg Oral Daily  . atorvastatin  20 mg Oral q1800  . cholecalciferol  2,000 Units Oral Daily  . enalapril  40 mg Oral Daily  . furosemide  40 mg Oral BID  . insulin aspart  0-20 Units Subcutaneous TID WC  . insulin aspart  0-5 Units Subcutaneous QHS  . insulin glargine  72 Units Subcutaneous BID  . latanoprost  1 drop Both Eyes QHS  . metoprolol tartrate  25 mg Oral BID  . pantoprazole  40 mg Oral Daily   Continuous Infusions:   PRN Meds:.acetaminophen **OR** acetaminophen, albuterol, HYDROmorphone (DILAUDID) injection, promethazine Assessment/Plan: Principal Problem:   Acute osteomyelitis of toe of left foot Active Problems:   Diabetes type 2, uncontrolled   Dyslipidemia   Diabetic peripheral neuropathy associated with type 2 diabetes mellitus   GERD   Chronic kidney disease, stage III (moderate)   Obesity BMI 46   Hypertension   Foot ulcer, left   Paroxysmal atrial fibrillation, new onset   Family history of coronary artery disease in father   Sleep apnea-on C-pap  Left second toe osteomyelitis s/p amputation: S/p left 2nd toe amputation yesterday.   No pain, fevers, leukocytosis.  BCx NGTD.  LE dopplers neg for DVT.  Spoke with ortho and recommended oral abx and follow-up in 2 weeks.    -f/u ortho in 2 weeks  -PT to eval -augmentin 875-125mg  x 5 days    Paroxysmal Atrial Fibrillation:  No prior  h/o.  Currently in NSR with rate controlled.  Cards consulted and started heparin gtt and beta blocker. TTE showed mild LVH and LVEF of 55%.  Trops neg.  Cards does not think a stress test is indicated because of body habitus has high potential for false+.  Currently on heparin gtt will transition to eliquis today.     -appreciate cards recs -begin eliquis (OK per surgery today)  -lopressor 25 mg bid  DMII:  -lantus 72 u [place] bid -SSI resistance  HTN:  -cont enalapril 40 mg daily, lasix 40 mg bid, ASA 81 mg daily, and atorvastatin 20 mg daily  CKD:  -cont to monitor  DVT PPx: heparin gtt-->transition to eliquis   Dispo: Spoke with ortho and OK to d/c today on oral abx with follow-up in 2 weeks.    The patient does have a current PCP (Aldine Contes, MD) and does need an [facility] hospital follow-up appointment after discharge.   LOS: 3 days   Jones Bales, MD 12/16/2014, 11:38 AM

## 2014-12-16 NOTE — Discharge Instructions (Signed)
I hope you feel better.  Please keep your follow-up appointments; this is very important for your continued recovery.    We have made the following additions/changes to your medications:  Please refer to your medication list.    Please continue to take all of your medications as prescribed.  You have been prescribed an antibiotic, augmentin for your foot.  Please take twice daily for 5 days. You have also been prescribed anticoagulation for atrial fibrillation.  Please take this twice daily (eqliuis). Also, you have been prescribed metoprolol twice daily to control your heart rate and for atrial fibrillation.    Do not miss any doses without contacting your primary physician.  If you have questions, please contact your physician or contact the Internal Medicine Teaching Service at [phone].  Please bring your medicications with you to your appointments; medications may be eye drops, herbals, vitamins, or pills.    If you believe you are suffering from a life-threatening emergency, go to the nearest Emergency Department.      Information on my medicine - ELIQUIS (apixaban)  This medication education was reviewed with me or my healthcare representative as part of my discharge preparation.   Why was Eliquis prescribed for you? Eliquis was prescribed for you to reduce the risk of a blood clot forming that can cause a stroke if you have a medical condition called atrial fibrillation (a type of irregular heartbeat).  What do You need to know about Eliquis ? Take your Eliquis TWICE DAILY - one tablet in the morning and one tablet in the evening with or without food. If you have difficulty swallowing the tablet whole please discuss with your pharmacist how to take the medication safely.  Take Eliquis exactly as prescribed by your doctor and DO NOT stop taking Eliquis without talking to the doctor who prescribed the medication.  Stopping may increase your risk of developing a stroke.   Refill your prescription before you run out.  After discharge, you should have regular check-up appointments with your healthcare provider that is prescribing your Eliquis.  In the future your dose may need to be changed if your kidney function or weight changes by a significant amount or as you get older.  What do you do if you miss a dose? If you miss a dose, take it as soon as you remember on the same day and resume taking twice daily.  Do not take more than one dose of ELIQUIS at the same time to make up a missed dose.  Important Safety Information A possible side effect of Eliquis is bleeding. You should call your healthcare provider right away if you experience any of the following: ? Bleeding from an injury or your nose that does not stop. ? Unusual colored urine (red or dark brown) or unusual colored stools (red or black). ? Unusual bruising for unknown reasons. ? A serious fall or if you hit your head (even if there is no bleeding).  Some medicines may interact with Eliquis and might increase your risk of bleeding or clotting while on Eliquis. To help avoid this, consult your healthcare provider or pharmacist prior to using any new prescription or non-prescription medications, including herbals, vitamins, non-steroidal anti-inflammatory drugs (NSAIDs) and supplements.  This website has more information on Eliquis (apixaban): http://[email or web address].com/eliquis/home

## 2014-12-16 NOTE — Progress Notes (Signed)
12/16/14 Patient being discharged home today. IV site removed and discharge instructions reviewed with patient.

## 2014-12-16 NOTE — Progress Notes (Signed)
Subjective: 1 Day Post-Op Procedure(s) (LRB): LEFT SECOND TOE AMPUTATION  (Left) Patient reports pain as mild. The patient is out of bed to the chair.   Objective: Vital signs in last 24 hours: Temp:  [97.7 F (36.5 C)-98.1 F (36.7 C)] 98 F (36.7 C) (06/05 1304) Pulse Rate:  [60-72] 60 (06/05 1304) Resp:  [18-20] 18 (06/05 1304) BP: (118-132)/(55-78) 118/55 mmHg (06/05 1304) SpO2:  [91 %-99 %] 97 % (06/05 1304) Weight:  [178.5 kg (393 lb 8.3 oz)] 178.5 kg (393 lb 8.3 oz) (06/05 0530)  Intake/Output from previous day: 06/04 0701 - 06/05 0700 In: 1837.7 [P.O.:120; I.V.:1717.7] Out: 1600 [Urine:1600] Intake/Output this shift: Total I/O In: 718 [P.O.:400; I.V.:318] Out: 400 [Urine:400]   Recent Labs  12/13/14 2135 12/14/14 0443 12/15/14 0441 12/16/14 0345  HGB 12.1* 11.1* 11.2* 11.1*    Recent Labs  12/15/14 0441 12/16/14 0345  WBC 6.0 7.0  RBC 3.95* 3.96*  HCT 34.9* 35.2*  PLT 173 190    Recent Labs  12/15/14 0441 12/16/14 0345  NA 137 137  K 4.1 4.1  CL 101 100*  CO2 28 29  BUN 17 16  CREATININE 1.71* 1.87*  GLUCOSE 141* 117*  CALCIUM 9.1 8.6*    Recent Labs  12/13/14 2135  INR 1.07   Left foot exam: Dressing clean and dry. Remaining toes look good. No obvious drainage of any kind. Postop shoe is fitting well.   Assessment/Plan: 1 Day Post-Op Procedure(s) (LRB): LEFT SECOND TOE AMPUTATION  (Left) Plan: Okay to discharge home today. He will keep the dressing in place and ambulate weightbearing as tolerated on the left with a postop shoe. He is instructed to keep the wound dry. Follow-up with Dr. Berenice Primas in 2 weeks.   [place] G 12/16/2014, 3:40 PM

## 2014-12-16 NOTE — Evaluation (Signed)
Physical Therapy Evaluation Patient Details Name: [name] MRN: [id number] DOB: Jul 06, 1951 Today's Date: 12/16/2014   History of Present Illness  [name] is a 64 year old man with a history of diabetes complicated by peripheral neuropathy who presents with a one-week history of left second toe darkening and drainage. He has a history of previous osteomyelitis of the third toe on the left requiring amputation in 2007. Tests revealed osteomyelitis and he underwent left 2nd toe amputation at the MP joint 12-15-14.  Clinical Impression  Pt is independent to modified independent with all functional mobility. He ambulated 350 feet without A.D. Recommend a single point cane at d/c for ambulation in the community. No further PT needs indicated. No follow up services needed. PT signing off.       Follow Up Recommendations No PT follow up    Equipment Recommendations  Cane    Recommendations for Other Services       Precautions / Restrictions Precautions Precautions: None Required Braces or Orthoses: Other Brace/Splint Other Brace/Splint: post op shoe left Restrictions LLE Weight Bearing: Weight bearing as tolerated      Mobility  Bed Mobility Overal bed mobility: Independent                Transfers Overall transfer level: Independent Equipment used: None                Ambulation/Gait Ambulation/Gait assistance: Modified independent (Device/Increase time) Ambulation Distance (Feet): 350 Feet Assistive device: None Gait Pattern/deviations: Step-through pattern;Antalgic   Gait velocity interpretation: at or above normal speed for age/gender    Stairs            Wheelchair Mobility    Modified Rankin (Stroke Patients Only)       Balance Overall balance assessment: Modified Independent                                           Pertinent Vitals/Pain Pain Assessment: 0-10 Pain Score: 6  Pain Location: left foot Pain  Intervention(s): Monitored during session    Home Living Family/patient expects to be discharged to:: Private residence Living Arrangements: Alone Available Help at Discharge: Family;Available PRN/intermittently Type of Home: House Home Access: Level entry     Home Layout: Two level;Able to live on main level with bedroom/bathroom Home Equipment: None      Prior Function Level of Independence: Independent         Comments: Pt lost his cane several months ago.     Hand Dominance        Extremity/Trunk Assessment   Upper Extremity Assessment: Overall WFL for tasks assessed           Lower Extremity Assessment: Overall WFL for tasks assessed      Cervical / Trunk Assessment: Normal  Communication   Communication: No difficulties  Cognition Arousal/Alertness: Awake/alert Behavior During Therapy: WFL for tasks assessed/performed Overall Cognitive Status: Within Functional Limits for tasks assessed                      General Comments      Exercises        Assessment/Plan    PT Assessment Patent does not need any further PT services  PT Diagnosis Difficulty walking;Acute pain   PT Problem List    PT Treatment Interventions     PT Goals (Current goals can be  found in the Care Plan section) Acute Rehab PT Goals Patient Stated Goal: home PT Goal Formulation: All assessment and education complete, DC therapy    Frequency     Barriers to discharge        Co-evaluation               End of Session Equipment Utilized During Treatment: Other (comment) (left post op shoe) Activity Tolerance: Patient tolerated treatment well Patient left: in chair;with call bell/phone within reach Nurse Communication: Mobility status         Time: 7741-2878 PT Time Calculation (min) (ACUTE ONLY): 18 min   Charges:   PT Evaluation $Initial PT Evaluation Tier I: 1 Procedure     PT G CodesLorriane Shire 12/16/2014, 1:31 PM

## 2014-12-16 NOTE — Progress Notes (Signed)
ANTICOAGULATION CONSULT NOTE  Pharmacy Consult for Eliquis Indication: atrial fibrillation  Allergies  Allergen Reactions  . Vancomycin     REACTION: ARF    Patient Measurements: Height: 6' 6" (198.1 cm) Weight: (!) 393 lb 8.3 oz (178.5 kg) IBW/kg (Calculated) : 91.4 Heparin Dosing Weight:   Vital Signs: Temp: 98.1 F (36.7 C) (06/05 0530) Temp Source: Oral (06/05 0530) BP: 125/63 mmHg (06/05 0530) Pulse Rate: 66 (06/05 0530)  Labs:  Recent Labs  12/13/14 2135  12/14/14 0443 12/14/14 1125 12/15/14 0441 12/15/14 2211 12/16/14 0345  HGB 12.1*  --  11.1*  --  11.2*  --  11.1*  HCT 38.1*  --  34.7*  --  34.9*  --  35.2*  PLT 205  --  194  --  173  --  190  APTT 32  --   --   --   --   --   --   LABPROT 14.1  --   --   --   --   --   --   INR 1.07  --   --   --   --   --   --   HEPARINUNFRC  --   < > 0.40 0.34 0.56 0.33 0.43  CREATININE 1.75*  --   --   --  1.71*  --  1.87*  TROPONINI <0.03  --  <0.03 <0.03  --   --   --   < > = values in this interval not displayed.  Estimated Creatinine Clearance: 71.2 mL/min (by C-G formula based on Cr of 1.87).   Medical History: Past Medical History  Diagnosis Date  . Retinopathy   . Neuropathy, lower extremity   . Hyperlipidemia   . GERD (gastroesophageal reflux disease)   . Erectile dysfunction   . Osteomyelitis of ankle and foot   . Hemorrhoids   . HEARING LOSS, SENSORINEURAL, BILATERAL 01/03/2007    Seen by ENT Dr. Dwight D. Bates 01/03/07  . Hypertension   . Asthma   . OSA on CPAP     Nocturnal polysomnogram on 01/21/2010 showed severe obstructive sleep apnea/hypopnea syndrome, AHI 74.1 per hour with non positional events, moderately loud snoring, and oxygen desaturation to a nadir of 78% on room air.  CPAP was successfully titrated to 17 CWP, AHI 1.1 per hour using a large ResMed Mirage Quattro full-face mask with heated humidifier. Bruxism was noted.   . Type II diabetes mellitus     w/complication NOS, type II   . Edema, macular, due to secondary diabetes   . DIABETIC FOOT ULCER 06/20/2009  . Arthritis     "elbows & knees" (12/13/2014)  . CKD (chronic kidney disease), stage III   . History of echocardiogram     a. 04/2008 Echo: EF 50-55%, abnl LV relaxation, mildly dil LA.  . Morbid obesity     Medications:  Prescriptions prior to admission  Medication Sig Dispense Refill Last Dose  . aspirin 81 MG EC tablet Take 1 tablet (81 mg total) by mouth daily. 90 tablet 3 12/13/2014 at Unknown time  . atorvastatin (LIPITOR) 20 MG tablet Take 1 tablet (20 mg total) by mouth daily. 31 tablet 3 12/12/2014 at Unknown time  . BESIVANCE 0.6 % SUSP Place 1 drop into both eyes 4 (four) times daily.   12 Past Month at Unknown time  . capsaicin (ZOSTRIX) 0.025 % cream Apply 1 application topically daily as needed (for pain).   2 weeks  . Cholecalciferol (  VITAMIN D3) 2000 UNITS capsule Take 1 capsule (2,000 Units total) by mouth daily. 90 capsule 0 12/12/2014 at Unknown time  . docusate sodium (COLACE) 100 MG capsule Take 1 capsule (100 mg total) by mouth 2 (two) times daily. (Patient taking differently: Take 200 mg by mouth daily as needed for mild constipation. ) 60 capsule 0 Past Week at Unknown time  . enalapril (VASOTEC) 20 MG tablet Take 2 tablets (40 mg total) by mouth daily. 60 tablet 5 12/13/2014 at Unknown time  . furosemide (LASIX) 20 MG tablet Take 2 tablets (40 mg total) by mouth 2 (two) times daily. 120 tablet 3 12/12/2014 at Unknown time  . Insulin Glargine (LANTUS SOLOSTAR) 100 UNIT/ML Solostar Pen Inject 72 Units into the skin 2 (two) times daily.   12/13/2014 at Unknown time  . insulin lispro (HUMALOG KWIKPEN) 100 UNIT/ML KiwkPen Inject 50 units before breakfast, 30 units before lunch, and 55 units before dinner, plus 1 unit for every 10 mg/dl above 100 mg/dl.   12/13/2014 at Unknown time  . LUMIGAN 0.01 % SOLN Place 1 drop into both eyes at bedtime.    12/12/2014 at Unknown time  . omeprazole (PRILOSEC) 20 MG capsule  TAKE 1 CAPSULE DAILY 90 capsule 0 12/13/2014 at Unknown time  . promethazine (PHENERGAN) 25 MG tablet Take 0.5-1 tablets (12.5-25 mg total) by mouth daily as needed for nausea.   12/13/2014 at Unknown time  . SIMBRINZA 1-0.2 % SUSP Place 1 drop into both eyes 2 (two) times daily. Take as instructed by Dr. Groat.   12/13/2014 at Unknown time  . ACCU-CHEK AVIVA PLUS test strip USE TO CHECK BLOOD SUGAR FOUR TIMES DAILY BEFORE MEALS AND AT BEDTIME 150 each 11 Taking  . ACCU-CHEK FASTCLIX LANCETS MISC Check blood sugar 4 times a day before meals and bedtime dx code 250.02 insulin requiring 102 each 6 Taking  . albuterol (PROAIR HFA) 108 (90 BASE) MCG/ACT inhaler Inhale 2 puffs into the lungs every 6 (six) hours as needed for wheezing or shortness of breath. 3 Inhaler 1 12/12/2014  . Blood Glucose Monitoring Suppl (ACCU-CHEK AVIVA PLUS) W/DEVICE KIT 1 each by Does not apply route 4 (four) times daily -  with meals and at bedtime. 1 kit 0 Taking  . HYDROcodone-acetaminophen (NORCO/VICODIN) 5-325 MG per tablet Take 1 and 1/2 tablets by mouth every 6 hours as needed for pain 180 tablet 0 6 weeks  . Insulin Pen Needle 31G X 5 MM MISC Use for insulin injection 5 times a day. 150 each 5 Taking    Assessment: 63 yo male therapeutic on heparin for AFib. Pt is s/p L 2nd toe amputation yesterday. Will transition pt to Eliquis. CBC stable.   Goal of Therapy:  Anti-Xa level 0.6-1 units/ml 4hrs after LMWH dose given Monitor platelets by anticoagulation protocol: Yes   Plan:  Eliquis 5 mg po bid CBC q72h Monitor for s/sx bleeding Needs education     Alison Masters, PharmD, BCPS Clinical Pharmacist Pager: [phone] 12/16/2014 9:50 AM   

## 2014-12-17 ENCOUNTER — Telehealth: Payer: Self-pay | Admitting: Dietician

## 2014-12-17 ENCOUNTER — Encounter (HOSPITAL_COMMUNITY): Payer: Self-pay | Admitting: Orthopedic Surgery

## 2014-12-17 ENCOUNTER — Telehealth: Payer: Self-pay | Admitting: Pharmacist

## 2014-12-17 LAB — GLUCOSE, CAPILLARY: Glucose-Capillary: 109 mg/dL — ABNORMAL HIGH (ref 65–99)

## 2014-12-17 NOTE — Telephone Encounter (Signed)
Can you try and get him in sooner for follow up Butch Penny? Thank you

## 2014-12-17 NOTE — Telephone Encounter (Signed)
Calling to assist with transition of care from hospital to home. Discharge date:12-16-14 Call date: 12-17-14 Hospital follow up appointment date: 01-10-15  Discharge medications reviewed:yes- his niece is going to get his prescriptions now. He will let us know if he has any problems obtaining them.  Is patient aware of hospital follow up appointments? yes Are there any problems with transportation? No  Other problems/concerns: No

## 2014-12-17 NOTE — Telephone Encounter (Signed)
Patient called with medication questions. Medications were reviewed with the patient, including name, instructions, indication, goals of therapy, potential side effects, importance of adherence, and safe use.  Patient advised to call back if further questions, and verbalized understanding by repeating back information.

## 2014-12-20 ENCOUNTER — Ambulatory Visit (INDEPENDENT_AMBULATORY_CARE_PROVIDER_SITE_OTHER): Payer: Medicare Other | Admitting: Internal Medicine

## 2014-12-20 DIAGNOSIS — E1165 Type 2 diabetes mellitus with hyperglycemia: Secondary | ICD-10-CM | POA: Diagnosis not present

## 2014-12-20 DIAGNOSIS — M86172 Other acute osteomyelitis, left ankle and foot: Secondary | ICD-10-CM

## 2014-12-20 DIAGNOSIS — I480 Paroxysmal atrial fibrillation: Secondary | ICD-10-CM

## 2014-12-20 DIAGNOSIS — IMO0002 Reserved for concepts with insufficient information to code with codable children: Secondary | ICD-10-CM

## 2014-12-20 DIAGNOSIS — Z794 Long term (current) use of insulin: Secondary | ICD-10-CM

## 2014-12-20 DIAGNOSIS — I4891 Unspecified atrial fibrillation: Secondary | ICD-10-CM

## 2014-12-20 DIAGNOSIS — B9689 Other specified bacterial agents as the cause of diseases classified elsewhere: Secondary | ICD-10-CM | POA: Diagnosis not present

## 2014-12-20 DIAGNOSIS — Z89422 Acquired absence of other left toe(s): Secondary | ICD-10-CM | POA: Diagnosis not present

## 2014-12-20 LAB — CULTURE, BLOOD (ROUTINE X 2)
Culture: NO GROWTH
Culture: NO GROWTH

## 2014-12-20 LAB — BASIC METABOLIC PANEL WITH GFR
BUN: 21 mg/dL (ref 6–23)
CALCIUM: 9.2 mg/dL (ref 8.4–10.5)
CO2: 27 meq/L (ref 19–32)
CREATININE: 1.61 mg/dL — AB (ref 0.50–1.35)
Chloride: 105 mEq/L (ref 96–112)
GFR, Est African American: 51 mL/min — ABNORMAL LOW
GFR, Est Non African American: 45 mL/min — ABNORMAL LOW
Glucose, Bld: 54 mg/dL — ABNORMAL LOW (ref 70–99)
POTASSIUM: 4.4 meq/L (ref 3.5–5.3)
Sodium: 142 mEq/L (ref 135–145)

## 2014-12-20 LAB — GLUCOSE, CAPILLARY: Glucose-Capillary: 76 mg/dL (ref 65–99)

## 2014-12-20 MED ORDER — INSULIN GLARGINE 100 UNIT/ML SOLOSTAR PEN: 74.0000 [IU] | PEN_INJECTOR | Freq: Two times a day (BID) | SUBCUTANEOUS | Status: DC

## 2014-12-20 MED ORDER — METOPROLOL TARTRATE 25 MG PO TABS: 12.5000 mg | ORAL_TABLET | Freq: Two times a day (BID) | ORAL | Status: DC

## 2014-12-20 NOTE — Progress Notes (Signed)
Patient ID: [name], male   DOB: 21-Sep-1950, 64 y.o.   MRN: [id number]   Subjective:   HPI: [name] is a 64 y.o. gentleman with past medical history below presents for hospital follow-up.  Reason(s) for visit:  Left 2nd toe osteomyelitis, s/p amputation: He was discharged from the hospital on 12/16/2014 after 3 days of hospitalization. He is currently on by mouth Augmentin which he is completing in 2 days. He has an appointment with Dr. Berenice Primas of orthopedic surgery on 12/27/2014. He denies any constitutional symptoms currently.  During that same hospitalization, he was found to have paroxysmal atrial fibrillation and was subsequently started on oral anticoagulation and metoprolol 25 mg twice a day. He has tolerated both medications.  He has done well since his discharge.  Past Medical History  Diagnosis Date  . Retinopathy   . Neuropathy, lower extremity   . Hyperlipidemia   . GERD (gastroesophageal reflux disease)   . Erectile dysfunction   . Osteomyelitis of ankle and foot   . Hemorrhoids   . HEARING LOSS, SENSORINEURAL, BILATERAL 01/03/2007    Seen by ENT Dr. Orpah Greek D. Redmond Baseman 01/03/07  . Hypertension   . Asthma   . OSA on CPAP     Nocturnal polysomnogram on 01/21/2010 showed severe obstructive sleep apnea/hypopnea syndrome, AHI 74.1 per hour with non positional events, moderately loud snoring, and oxygen desaturation to a nadir of 78% on room air.  CPAP was successfully titrated to 17 CWP, AHI 1.1 per hour using a large ResMed Mirage Quattro full-face mask with heated humidifier. Bruxism was noted.   . Type II diabetes mellitus     w/complication NOS, type II  . Edema, macular, due to secondary diabetes   . DIABETIC FOOT ULCER 06/20/2009  . Arthritis     "elbows & knees" (12/13/2014)  . CKD (chronic kidney disease), stage III   . History of echocardiogram     a. 04/2008 Echo: EF 50-55%, abnl LV relaxation, mildly dil LA.  . Morbid obesity      ROS: Constitutional:  Denies fevers, chills, diaphoresis, appetite change and fatigue.  Respiratory: Denies SOB, DOE, cough, chest tightness, and wheezing.  CVS: No chest pain, palpitations and leg swelling.  GI: No abdominal pain, nausea, vomiting, bloody stools GU: No dysuria, frequency, hematuria, or flank pain.  MSK: No myalgias, back pain, joint swelling, arthralgias  Psych: No depression symptoms. No SI or SA.    Objective:  Physical Exam: Filed Vitals:   12/20/14 1042  BP: 127/65  Pulse: 58  Temp: 97.8 F (36.6 C)  Weight: 400 lb 12.8 oz (181.802 kg)  SpO2: 100%   General: Well nourished. No acute distress.  HEENT: Normal oral mucosa. MMM.  Lungs: CTA bilaterally. No wheezing. Heart: RRR; no extra sounds or murmurs  Abdomen: Non-distended, normal bowel sounds, soft, nontender; no hepatosplenomegaly  Extremities: His left foot is wrapped in bandaging which I did not remove. No joint swelling or tenderness. Neurologic: Normal EOM,  Alert and oriented x3. No obvious neurologic/cranial nerve deficits.  Assessment & Plan:  Discussed case with Dr Beryle Beams See problem based charting for assessment and plan.

## 2014-12-20 NOTE — Progress Notes (Signed)
Medicine attending: Medical history, presenting problems, physical findings, and medications, reviewed with Dr Polk Kazibwe and I concur with his evaluation and management plan. 

## 2014-12-20 NOTE — Patient Instructions (Addendum)
General Instructions: Please increase Lantus from 72 units to 74 units twice a day  Please reduced Metoprolol from 25 mg twice a day to 12.5 (1/2 pill) twice a day  Please keep your appointment with Dr Berenice Primas Please come back in 2-3 months to see Dr Dareen Piano  Thank you for bringing your medicines today. This helps [place] keep you safe from mistakes.   Progress Toward Treatment Goals:  Treatment Goal 10/08/2014  Hemoglobin A1C unchanged  Blood pressure at goal  Prevent falls -    Self Care Goals & Plans:  Self Care Goal 12/13/2014  Manage my medications take my medicines as prescribed; bring my medications to every visit; refill my medications on time; follow the sick day instructions if I am sick  Monitor my health keep track of my blood glucose; bring my glucose meter and log to each visit; keep track of my blood pressure; check my feet daily  Eat healthy foods eat more vegetables; eat fruit for snacks and desserts; eat baked foods instead of fried foods; drink diet soda or water instead of juice or soda  Be physically active find an activity I enjoy  Prevent falls -  Meeting treatment goals -    Home Blood Glucose Monitoring 10/08/2014  Check my blood sugar 4 times a day  When to check my blood sugar before meals; at bedtime     Care Management & Community Referrals:  Referral 10/08/2014  Referrals made for care management support diabetes educator  Referrals made to community resources none

## 2014-12-20 NOTE — Assessment & Plan Note (Signed)
Status post amputation. Doing well.  Plan  Continue with Augmentin. To complete in 2 days  F/u with Dr Berenice Primas on 6/16

## 2014-12-20 NOTE — Assessment & Plan Note (Signed)
This patients CHA2DS2-VASc Score and unadjusted Ischemic Stroke Rate (% per year) is equal to 2.2 % stroke rate/year from a score of 2.  above score calculated as 1 point each if present [CHF, HTN, DM, Vascular=MI/PAD/Aortic Plaque, Age if 65-74, or Male] Above score calculated as 2 points each if present [Age > 75, or Stroke/TIA/TE]  Plan  HR 58 on metoprolol 25 mg twice a day. I will cut it down to 12.5 mg twice a day even though he is asymptomatic. Continue with Eliquis

## 2014-12-20 NOTE — Assessment & Plan Note (Signed)
Lab Results  Component Value Date   HGBA1C 8.4 12/13/2014   HGBA1C 8.2 09/19/2014   HGBA1C 8.3 07/18/2014     Assessment: Diabetes control:   Progress toward A1C goal:    Comments: Diabetes is not well controlled. Blood sugars averages 197 with 2.6 hypoglycemia of 58. Highest BS 456. He is currently on Lantus 72 units twice a day and Humalog 50 units before breakfast, 30 units before lunch, and 55 units before dinner plus 1 unit for every 10 mg/dL above 100 mg/dL. Previously he has been referred patient in the past to endocrinology in hopes of getting better control of his diabetes, and he was followed by Dr. Buddy Duty in the past but did not follow up with Dr. Buddy Duty.   Plan: Medications:  continue current medications; I recommended that increases his Lantus to 74 units twice a day. In case he experiences increased hypoglycemic episodes, I instructed him to cut it back to 72 units twice a day. He will continue with his sliding scale NovoLog insulin as above.  Home glucose monitoring: Frequency:   Timing:   Instruction/counseling given: discussed foot care  follow-up in 1-2 months with PCP.

## 2014-12-26 ENCOUNTER — Other Ambulatory Visit: Payer: Self-pay | Admitting: *Deleted

## 2014-12-26 ENCOUNTER — Other Ambulatory Visit: Payer: Self-pay | Admitting: Internal Medicine

## 2014-12-26 DIAGNOSIS — E1142 Type 2 diabetes mellitus with diabetic polyneuropathy: Secondary | ICD-10-CM

## 2014-12-26 NOTE — Telephone Encounter (Signed)
Last sign out 11/30/2014

## 2014-12-27 MED ORDER — HYDROCODONE-ACETAMINOPHEN 5-325 MG PO TABS: ORAL_TABLET | ORAL | Status: DC

## 2014-12-31 ENCOUNTER — Telehealth: Payer: Self-pay | Admitting: Dietician

## 2014-12-31 NOTE — Telephone Encounter (Signed)
He left a voicemail on my phone because he could not get through to the front desk. He wants to know if he can come pick up his prescription for his pain medication. He asks for a return call to [phone].

## 2014-12-31 NOTE — Telephone Encounter (Signed)
Called pt.

## 2015-01-10 ENCOUNTER — Encounter: Payer: Medicare Other | Admitting: Internal Medicine

## 2015-01-10 ENCOUNTER — Other Ambulatory Visit: Payer: Self-pay | Admitting: Internal Medicine

## 2015-01-11 ENCOUNTER — Telehealth: Payer: Self-pay | Admitting: *Deleted

## 2015-01-11 NOTE — Telephone Encounter (Signed)
Call to [facility] for Prior Authorization for Eliquis 5 mg tablets.  Prior Authorization approved 01/11/2015 thru 01/11/2016.  Prior Authorization code [id number].  Call to [facility] at [phone] with approval.  [name], RN 01/11/2015 2;07 PM.

## 2015-01-15 ENCOUNTER — Other Ambulatory Visit: Payer: Self-pay | Admitting: *Deleted

## 2015-01-15 ENCOUNTER — Telehealth: Payer: Self-pay | Admitting: Internal Medicine

## 2015-01-15 NOTE — Telephone Encounter (Signed)
Called pharmacy, pt is not charged anything for his blood thinners, most of his meds are free at this point, called pt, he states he needed refills, he has a refill as stated by pharm tech, he was informed, he will check tomorrow due to the fact he is out of town at a funeral

## 2015-01-15 NOTE — Telephone Encounter (Signed)
Patient called with questions concerning his insurance paying for his Blood thinner Medications.  Patient uses [facility] on [address].

## 2015-01-23 ENCOUNTER — Other Ambulatory Visit: Payer: Self-pay | Admitting: Internal Medicine

## 2015-01-28 ENCOUNTER — Other Ambulatory Visit: Payer: Self-pay | Admitting: *Deleted

## 2015-01-28 DIAGNOSIS — E1142 Type 2 diabetes mellitus with diabetic polyneuropathy: Secondary | ICD-10-CM

## 2015-01-30 MED ORDER — HYDROCODONE-ACETAMINOPHEN 5-325 MG PO TABS: ORAL_TABLET | ORAL | Status: DC

## 2015-01-30 NOTE — Telephone Encounter (Signed)
Pt would like to pick up today

## 2015-02-11 ENCOUNTER — Other Ambulatory Visit: Payer: Self-pay | Admitting: Internal Medicine

## 2015-02-11 ENCOUNTER — Encounter (HOSPITAL_BASED_OUTPATIENT_CLINIC_OR_DEPARTMENT_OTHER): Payer: Medicare Other

## 2015-02-11 MED ORDER — INSULIN LISPRO 100 UNIT/ML (KWIKPEN): PEN_INJECTOR | SUBCUTANEOUS | Status: DC

## 2015-02-12 ENCOUNTER — Encounter: Payer: Medicare Other | Admitting: Internal Medicine

## 2015-02-20 ENCOUNTER — Other Ambulatory Visit: Payer: Self-pay | Admitting: *Deleted

## 2015-02-20 DIAGNOSIS — I10 Essential (primary) hypertension: Secondary | ICD-10-CM

## 2015-02-20 NOTE — Telephone Encounter (Signed)
Pharmacy request a 90 day supply of both these meds.

## 2015-02-21 ENCOUNTER — Other Ambulatory Visit: Payer: Self-pay | Admitting: Student in an Organized Health Care Education/Training Program

## 2015-02-21 DIAGNOSIS — I10 Essential (primary) hypertension: Secondary | ICD-10-CM

## 2015-02-21 MED ORDER — ATORVASTATIN CALCIUM 20 MG PO TABS: 20.0000 mg | ORAL_TABLET | Freq: Every day | ORAL | Status: DC

## 2015-02-21 MED ORDER — ENALAPRIL MALEATE 20 MG PO TABS: 40.0000 mg | ORAL_TABLET | Freq: Every day | ORAL | Status: DC

## 2015-02-23 ENCOUNTER — Other Ambulatory Visit: Payer: Self-pay | Admitting: Oncology

## 2015-02-26 ENCOUNTER — Other Ambulatory Visit: Payer: Self-pay | Admitting: Internal Medicine

## 2015-02-26 DIAGNOSIS — E1142 Type 2 diabetes mellitus with diabetic polyneuropathy: Secondary | ICD-10-CM

## 2015-02-26 MED ORDER — HYDROCODONE-ACETAMINOPHEN 5-325 MG PO TABS: ORAL_TABLET | ORAL | Status: DC

## 2015-02-26 NOTE — Telephone Encounter (Signed)
Pt aware Rx is ready. 

## 2015-02-26 NOTE — Telephone Encounter (Signed)
Pt called requesting hydrocodone to be filled.  °

## 2015-03-12 ENCOUNTER — Ambulatory Visit (INDEPENDENT_AMBULATORY_CARE_PROVIDER_SITE_OTHER): Payer: Medicare Other | Admitting: Internal Medicine

## 2015-03-12 ENCOUNTER — Encounter: Payer: Self-pay | Admitting: Internal Medicine

## 2015-03-12 VITALS — BP 151/62 | HR 60 | Temp 97.6°F | Ht 78.0 in | Wt 399.7 lb

## 2015-03-12 DIAGNOSIS — IMO0002 Reserved for concepts with insufficient information to code with codable children: Secondary | ICD-10-CM

## 2015-03-12 DIAGNOSIS — I10 Essential (primary) hypertension: Secondary | ICD-10-CM

## 2015-03-12 DIAGNOSIS — I480 Paroxysmal atrial fibrillation: Secondary | ICD-10-CM

## 2015-03-12 DIAGNOSIS — E1165 Type 2 diabetes mellitus with hyperglycemia: Secondary | ICD-10-CM | POA: Diagnosis not present

## 2015-03-12 DIAGNOSIS — I4891 Unspecified atrial fibrillation: Secondary | ICD-10-CM

## 2015-03-12 DIAGNOSIS — Z23 Encounter for immunization: Secondary | ICD-10-CM

## 2015-03-12 DIAGNOSIS — B351 Tinea unguium: Secondary | ICD-10-CM

## 2015-03-12 DIAGNOSIS — Z7901 Long term (current) use of anticoagulants: Secondary | ICD-10-CM

## 2015-03-12 DIAGNOSIS — Z0000 Encounter for general adult medical examination without abnormal findings: Secondary | ICD-10-CM | POA: Insufficient documentation

## 2015-03-12 DIAGNOSIS — Z89422 Acquired absence of other left toe(s): Secondary | ICD-10-CM

## 2015-03-12 LAB — GLUCOSE, CAPILLARY: GLUCOSE-CAPILLARY: 227 mg/dL — AB (ref 65–99)

## 2015-03-12 LAB — POCT GLYCOSYLATED HEMOGLOBIN (HGB A1C): Hemoglobin A1C: 8.7

## 2015-03-12 MED ORDER — APIXABAN 5 MG PO TABS: 5.0000 mg | ORAL_TABLET | Freq: Two times a day (BID) | ORAL | Status: DC

## 2015-03-12 NOTE — Assessment & Plan Note (Signed)
-   Will give PCV 13 and flu vaccine today

## 2015-03-12 NOTE — Progress Notes (Signed)
   Subjective:    Patient ID: [name], male    DOB: Jun 29, 1951, 64 y.o.   MRN: [id number]  HPI Patient seen and examined. He is here for routine follow up of his DM and HTN. Patient denies any complaints currently.   Review of Systems  Constitutional: Negative.   HENT: Negative.   Eyes: Negative.   Respiratory: Negative.   Cardiovascular: Negative.   Gastrointestinal: Negative.   Musculoskeletal: Negative.   Skin: Negative.   Neurological: Negative.   Psychiatric/Behavioral: Negative.        Objective:   Physical Exam  Constitutional: He is oriented to person, place, and time. He appears well-developed.  HENT:  Head: Normocephalic and atraumatic.  Eyes: Conjunctivae are normal.  Neck: Normal range of motion.  Cardiovascular: Normal rate, regular rhythm and normal heart sounds.   Pulmonary/Chest: Effort normal and breath sounds normal. No respiratory distress. He has no wheezes.  Abdominal: Soft. Bowel sounds are normal. He exhibits no distension. There is no tenderness.  Musculoskeletal: Normal range of motion. He exhibits no edema or tenderness.  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  Skin: Skin is warm and dry. No rash noted. No erythema.  Psychiatric: He has a normal mood and affect. His behavior is normal.          Assessment & Plan:  Please see problem based charting for assessment and plan

## 2015-03-12 NOTE — Assessment & Plan Note (Addendum)
BP Readings from Last 3 Encounters:  03/12/15 151/62  12/20/14 127/65  12/16/14 118/55    Lab Results  Component Value Date   NA 142 12/20/2014   K 4.4 12/20/2014   CREATININE 1.61* 12/20/2014    Assessment: Blood pressure control:  fair Progress toward BP goal:   deteriorated Comments: patient id not take BP meds this AM  Plan: Medications:  continue current medications Educational resources provided: brochure (denied) Self management tools provided:   Other plans: Patient to follow up in 1 month for recheck. He is to follow up with his BP machine and his BP recordings from home

## 2015-03-12 NOTE — Assessment & Plan Note (Addendum)
Lab Results  Component Value Date   HGBA1C 8.7 03/12/2015   HGBA1C 8.4 12/13/2014   HGBA1C 8.2 09/19/2014     Assessment: Diabetes control:  poor Progress toward A1C goal:   deteriorated Comments: Patient is compliant with meds but not with diet or exercise. Also noted to have lows to the 70s and highs in the 300s  Plan: Medications:  continue current medications Home glucose monitoring: Frequency:   Timing:   Instruction/counseling given: reminded to bring blood glucose meter & log to each visit, discussed foot care, discussed the need for weight loss and discussed diet Educational resources provided: brochure (denied) Self management tools provided: copy of home glucose meter download Other plans: Patient encouraged to follow diabetic diet and to exercise. He understands the importance of these measures. Weight loss goal of 5 lbs over the next 1 month was set. Will continue with current meds for now. Will also refer to podiatry for nail care given recent amputation and + onychomycosis

## 2015-03-12 NOTE — Patient Instructions (Signed)
-   It was a pleasure meeting you today - Please continue the diet and exercise like we talked about - Follow up in 1 month with your blood pressure log and your new machine - Our goal weight loss is 5 lbs over the next month - Bring your blood sugar log when you follow up  Exercise to Lose Weight Exercise and a healthy diet may help you lose weight. Your doctor may suggest specific exercises. EXERCISE IDEAS AND TIPS  Choose low-cost things you enjoy doing, such as walking, bicycling, or exercising to workout videos.  Take stairs instead of the elevator.  Walk during your lunch break.  Park your car further away from work or school.  Go to a gym or an exercise class.  Start with 5 to 10 minutes of exercise each day. Build up to 30 minutes of exercise 4 to 6 days a week.  Wear shoes with good support and comfortable clothes.  Stretch before and after working out.  Work out until you breathe harder and your heart beats faster.  Drink extra water when you exercise.  Do not do so much that you hurt yourself, feel dizzy, or get very short of breath. Exercises that burn about 150 calories:  Running 1  miles in 15 minutes.  Playing volleyball for 45 to 60 minutes.  Washing and waxing a car for 45 to 60 minutes.  Playing touch football for 45 minutes.  Walking 1  miles in 35 minutes.  Pushing a stroller 1  miles in 30 minutes.  Playing basketball for 30 minutes.  Raking leaves for 30 minutes.  Bicycling 5 miles in 30 minutes.  Walking 2 miles in 30 minutes.  Dancing for 30 minutes.  Shoveling snow for 15 minutes.  Swimming laps for 20 minutes.  Walking up stairs for 15 minutes.  Bicycling 4 miles in 15 minutes.  Gardening for 30 to 45 minutes.  Jumping rope for 15 minutes.  Washing windows or floors for 45 to 60 minutes. Document Released: 08/01/2010 Document Revised: 09/21/2011 Document Reviewed: 08/01/2010 [facility] Patient Information 2015 [place],  [place]. This information is not intended to replace advice given to you by your health care provider. Make sure you discuss any questions you have with your health care provider.

## 2015-03-12 NOTE — Assessment & Plan Note (Signed)
-   No new complaints - Patient is rate controlled on current dose of metoprolol - Will refill eliquis today

## 2015-03-13 ENCOUNTER — Other Ambulatory Visit: Payer: Self-pay | Admitting: Internal Medicine

## 2015-03-14 ENCOUNTER — Telehealth: Payer: Self-pay | Admitting: *Deleted

## 2015-03-14 ENCOUNTER — Other Ambulatory Visit: Payer: Self-pay | Admitting: Internal Medicine

## 2015-03-14 MED ORDER — INSULIN LISPRO 100 UNIT/ML (KWIKPEN): PEN_INJECTOR | SUBCUTANEOUS | Status: DC

## 2015-03-14 NOTE — Telephone Encounter (Signed)
Pt aware Rx at pharmacy. 

## 2015-03-14 NOTE — Telephone Encounter (Signed)
RTC from patient about referral to Podiatry.  Patient said that he has never seen a Podiatrist and is ok with referral to [place].  Patient also said that he will need to get a refill on his Humalog Insulin.  He said that with the current dosing he will not have enough to last to the week-end.  [name], RN 03/14/2015 11:56 AM

## 2015-03-20 ENCOUNTER — Other Ambulatory Visit: Payer: Self-pay | Admitting: Internal Medicine

## 2015-03-20 MED ORDER — PROMETHAZINE HCL 25 MG PO TABS: ORAL_TABLET | ORAL | Status: DC

## 2015-03-20 NOTE — Telephone Encounter (Signed)
Pt called requesting promethazine to filled.

## 2015-03-29 ENCOUNTER — Other Ambulatory Visit: Payer: Self-pay | Admitting: Internal Medicine

## 2015-03-29 DIAGNOSIS — E1142 Type 2 diabetes mellitus with diabetic polyneuropathy: Secondary | ICD-10-CM

## 2015-03-29 NOTE — Telephone Encounter (Signed)
Pt called requesting hydrocodone to be filled.  °

## 2015-04-02 MED ORDER — HYDROCODONE-ACETAMINOPHEN 5-325 MG PO TABS: ORAL_TABLET | ORAL | Status: DC

## 2015-04-02 NOTE — Telephone Encounter (Signed)
Pt aware.

## 2015-04-10 ENCOUNTER — Ambulatory Visit: Payer: Medicare Other | Admitting: Podiatry

## 2015-04-16 ENCOUNTER — Encounter: Payer: Self-pay | Admitting: Internal Medicine

## 2015-04-16 ENCOUNTER — Telehealth: Payer: Self-pay | Admitting: *Deleted

## 2015-04-16 ENCOUNTER — Ambulatory Visit (INDEPENDENT_AMBULATORY_CARE_PROVIDER_SITE_OTHER): Payer: Medicare Other | Admitting: Internal Medicine

## 2015-04-16 VITALS — BP 124/72 | HR 63 | Temp 97.8°F | Ht 78.0 in | Wt >= 6400 oz

## 2015-04-16 DIAGNOSIS — Z794 Long term (current) use of insulin: Secondary | ICD-10-CM | POA: Diagnosis not present

## 2015-04-16 DIAGNOSIS — I10 Essential (primary) hypertension: Secondary | ICD-10-CM

## 2015-04-16 DIAGNOSIS — E1165 Type 2 diabetes mellitus with hyperglycemia: Secondary | ICD-10-CM | POA: Diagnosis not present

## 2015-04-16 DIAGNOSIS — Z961 Presence of intraocular lens: Secondary | ICD-10-CM | POA: Diagnosis not present

## 2015-04-16 DIAGNOSIS — E1129 Type 2 diabetes mellitus with other diabetic kidney complication: Secondary | ICD-10-CM

## 2015-04-16 DIAGNOSIS — Z0000 Encounter for general adult medical examination without abnormal findings: Secondary | ICD-10-CM

## 2015-04-16 DIAGNOSIS — R5382 Chronic fatigue, unspecified: Secondary | ICD-10-CM | POA: Diagnosis not present

## 2015-04-16 DIAGNOSIS — IMO0002 Reserved for concepts with insufficient information to code with codable children: Secondary | ICD-10-CM

## 2015-04-16 DIAGNOSIS — H40053 Ocular hypertension, bilateral: Secondary | ICD-10-CM | POA: Diagnosis not present

## 2015-04-16 LAB — GLUCOSE, CAPILLARY: Glucose-Capillary: 213 mg/dL — ABNORMAL HIGH (ref 65–99)

## 2015-04-16 MED ORDER — GLUCOSE BLOOD VI STRP: ORAL_STRIP | Status: DC

## 2015-04-16 MED ORDER — FUROSEMIDE 40 MG PO TABS: 40.0000 mg | ORAL_TABLET | Freq: Two times a day (BID) | ORAL | Status: DC

## 2015-04-16 MED ORDER — APIXABAN 5 MG PO TABS: 5.0000 mg | ORAL_TABLET | Freq: Two times a day (BID) | ORAL | Status: DC

## 2015-04-16 MED ORDER — PROMETHAZINE HCL 25 MG PO TABS: ORAL_TABLET | ORAL | Status: DC

## 2015-04-16 NOTE — Assessment & Plan Note (Signed)
Lab Results  Component Value Date   HGBA1C 8.7 03/12/2015   HGBA1C 8.4 12/13/2014   HGBA1C 8.2 09/19/2014     Assessment: Diabetes control:  poorly controlled Progress toward A1C goal:    Comments: glucometer reviewed with blood sugars ranging from 60s to 300s  Plan: Medications:  continue current medications Home glucose monitoring: Frequency:   Timing:   Instruction/counseling given: reminded to get eye exam, reminded to bring blood glucose meter & log to each visit, discussed the need for weight loss and discussed diet Educational resources provided:   Self management tools provided:   Other plans: We discussed going up on the lantus and decreasing his pre breakfast humalog but he would like to try diet and exercise at this time to improve his sugars. He is also on a very high dose of insulin. Will attempt to refer to endocrine for further management

## 2015-04-16 NOTE — Assessment & Plan Note (Signed)
BP Readings from Last 3 Encounters:  04/16/15 124/72  03/12/15 151/62  12/20/14 127/65    Lab Results  Component Value Date   NA 142 12/20/2014   K 4.4 12/20/2014   CREATININE 1.61* 12/20/2014    Assessment: Blood pressure control:  well controlled Progress toward BP goal:   at goal Comments: Patient's BP was elevated on arrival to [facility] and has had higher BPs at home up to the 450T systolic. He attributes this to stress at home. He also did not take his meds this AM  Plan: Medications:  continue current medications Educational resources provided:   Self management tools provided:   Other plans: Patient to maintain BP log at home

## 2015-04-16 NOTE — Assessment & Plan Note (Signed)
-   He complains of fatigue over the past month - He attributes this to stress at home - Will check CBC and TSH but likely due to stress at home

## 2015-04-16 NOTE — Telephone Encounter (Signed)
Call this am to [facility] to ask about dismissal of patient from Dr. Buddy Duty .  Patient said that he was told he would no longer be seen by Dr. Buddy Duty. According  to patient he was not given a reason for the dismissal.  Call to office to see reason for and if other practices will do the same thing.  RTC from [place] and RTC back to [place] to find out what patient will need to do to continue or will patient need to be referred to another office. [name], RN 04/16/2015 4:45  PM

## 2015-04-16 NOTE — Progress Notes (Signed)
   Subjective:    Patient ID: [name], male    DOB: 31-Dec-1950, 64 y.o.   MRN: [id number]  HPI Patient seen and examined. He presents for routine follow up of his DM and HTN. He has put on 10 lbs since his last visit and attributes this to stress at home. The importance of diet and exercise in controlling his diabetes and HTN was discussed in detail. He also complains of decreased energy and fatigue over the past month since stress at home started. No fevers, no loss of appetite, no cp, no sob, no heat or cold intolerance, no abd pain.   Review of Systems  Constitutional: Positive for fatigue. Negative for fever, chills, activity change, appetite change and unexpected weight change.  HENT: Negative.   Eyes: Negative.   Respiratory: Negative.   Cardiovascular: Negative.   Gastrointestinal: Negative.   Endocrine: Negative.   Musculoskeletal: Negative.   Skin: Negative.   Neurological: Negative.   Psychiatric/Behavioral: Negative.        Objective:   Physical Exam  Constitutional: He is oriented to person, place, and time. He appears well-developed and well-nourished.  HENT:  Head: Normocephalic and atraumatic.  Eyes: Conjunctivae are normal.  Neck: Normal range of motion.  Cardiovascular: Normal rate, regular rhythm and normal heart sounds.   Pulmonary/Chest: Effort normal and breath sounds normal. No respiratory distress. He has no wheezes.  Abdominal: Soft. Bowel sounds are normal. He exhibits no distension. There is no tenderness.  Musculoskeletal: Normal range of motion. He exhibits edema.  Trace b/l LE edema +  Neurological: He is alert and oriented to person, place, and time. No cranial nerve deficit.  Skin: Skin is warm and dry. No rash noted. No erythema.  Psychiatric: He has a normal mood and affect. His behavior is normal.          Assessment & Plan:  Please see problem based charting for assessment and plan:

## 2015-04-16 NOTE — Patient Instructions (Signed)
-   It was a pleasure seeing you today - We will try to refer you to endocrinology for follow up given uncontrolled blood sugars - We will check some blood work today - We will consider adding a new BP medication on next visit if your BP remains high  Exercise to Lose Weight Exercise and a healthy diet may help you lose weight. Your doctor may suggest specific exercises. EXERCISE IDEAS AND TIPS  Choose low-cost things you enjoy doing, such as walking, bicycling, or exercising to workout videos.  Take stairs instead of the elevator.  Walk during your lunch break.  Park your car further away from work or school.  Go to a gym or an exercise class.  Start with 5 to 10 minutes of exercise each day. Build up to 30 minutes of exercise 4 to 6 days a week.  Wear shoes with good support and comfortable clothes.  Stretch before and after working out.  Work out until you breathe harder and your heart beats faster.  Drink extra water when you exercise.  Do not do so much that you hurt yourself, feel dizzy, or get very short of breath. Exercises that burn about 150 calories:  Running 1  miles in 15 minutes.  Playing volleyball for 45 to 60 minutes.  Washing and waxing a car for 45 to 60 minutes.  Playing touch football for 45 minutes.  Walking 1  miles in 35 minutes.  Pushing a stroller 1  miles in 30 minutes.  Playing basketball for 30 minutes.  Raking leaves for 30 minutes.  Bicycling 5 miles in 30 minutes.  Walking 2 miles in 30 minutes.  Dancing for 30 minutes.  Shoveling snow for 15 minutes.  Swimming laps for 20 minutes.  Walking up stairs for 15 minutes.  Bicycling 4 miles in 15 minutes.  Gardening for 30 to 45 minutes.  Jumping rope for 15 minutes.  Washing windows or floors for 45 to 60 minutes. Document Released: 08/01/2010 Document Revised: 09/21/2011 Document Reviewed: 08/01/2010 [facility] Patient Information 2015 [place], [place]. This information is  not intended to replace advice given to you by your health care provider. Make sure you discuss any questions you have with your health care provider.

## 2015-04-16 NOTE — Assessment & Plan Note (Signed)
-   will do Hep C screening today - he has received his flu and pneumonia vaccines

## 2015-04-17 ENCOUNTER — Telehealth: Payer: Self-pay | Admitting: *Deleted

## 2015-04-17 LAB — CBC WITH DIFFERENTIAL/PLATELET
BASOS ABS: 0 10*3/uL (ref 0.0–0.2)
Basos: 1 %
EOS (ABSOLUTE): 0.2 10*3/uL (ref 0.0–0.4)
Eos: 4 %
Hematocrit: 36 % — ABNORMAL LOW (ref 37.5–51.0)
Hemoglobin: 11.3 g/dL — ABNORMAL LOW (ref 12.6–17.7)
IMMATURE GRANS (ABS): 0 10*3/uL (ref 0.0–0.1)
IMMATURE GRANULOCYTES: 0 %
Lymphocytes Absolute: 1.3 10*3/uL (ref 0.7–3.1)
Lymphs: 32 %
MCH: 28.3 pg (ref 26.6–33.0)
MCHC: 31.4 g/dL — ABNORMAL LOW (ref 31.5–35.7)
MCV: 90 fL (ref 79–97)
MONOCYTES: 6 %
Monocytes Absolute: 0.3 10*3/uL (ref 0.1–0.9)
NEUTROS PCT: 57 %
Neutrophils Absolute: 2.2 10*3/uL (ref 1.4–7.0)
PLATELETS: 136 10*3/uL — AB (ref 150–379)
RBC: 4 x10E6/uL — AB (ref 4.14–5.80)
RDW: 14.8 % (ref 12.3–15.4)
WBC: 3.9 10*3/uL (ref 3.4–10.8)

## 2015-04-17 LAB — HEPATITIS C ANTIBODY (REFLEX)

## 2015-04-17 LAB — HCV COMMENT:

## 2015-04-17 LAB — TSH: TSH: 2.18 u[IU]/mL (ref 0.450–4.500)

## 2015-04-17 NOTE — Telephone Encounter (Signed)
RTC from patient given appointment for 04/18/2015 at 11:00 AM with Dr. Eduard Clos.  Patient voiced understanding of the appointments and location.  [name], RN 04/17/2015 10:02 AM

## 2015-04-17 NOTE — Telephone Encounter (Signed)
Call to [facility] for Endocrinology appointment.  Patient scheduled for an appointment on 04/18/2015 at 11:00 AM with Dr. Dwyane Dee.  Attempts to call patient with appointment.  Patient was asked to call the Clinics as soon as possible.  [name], RN 04/17/2015 9:51 AM

## 2015-04-18 ENCOUNTER — Ambulatory Visit: Payer: Medicare Other | Admitting: Endocrinology

## 2015-04-23 ENCOUNTER — Ambulatory Visit: Payer: Medicare Other | Admitting: Pharmacist

## 2015-04-28 ENCOUNTER — Other Ambulatory Visit: Payer: Self-pay | Admitting: Internal Medicine

## 2015-04-29 ENCOUNTER — Other Ambulatory Visit: Payer: Self-pay | Admitting: *Deleted

## 2015-04-29 DIAGNOSIS — E1142 Type 2 diabetes mellitus with diabetic polyneuropathy: Secondary | ICD-10-CM

## 2015-04-29 MED ORDER — HYDROCODONE-ACETAMINOPHEN 5-325 MG PO TABS: ORAL_TABLET | ORAL | Status: DC

## 2015-04-29 NOTE — Telephone Encounter (Signed)
Rx and copy placed in pt's folder-pt aware rx ready for pickup.[name]10/17/20163:38 PM

## 2015-04-30 ENCOUNTER — Encounter: Payer: Self-pay | Admitting: Endocrinology

## 2015-04-30 ENCOUNTER — Ambulatory Visit (INDEPENDENT_AMBULATORY_CARE_PROVIDER_SITE_OTHER): Payer: Medicare Other | Admitting: Endocrinology

## 2015-04-30 ENCOUNTER — Telehealth: Payer: Self-pay | Admitting: Internal Medicine

## 2015-04-30 VITALS — BP 160/70 | HR 81 | Temp 98.2°F | Resp 18 | Ht 78.0 in | Wt >= 6400 oz

## 2015-04-30 DIAGNOSIS — E1165 Type 2 diabetes mellitus with hyperglycemia: Secondary | ICD-10-CM

## 2015-04-30 DIAGNOSIS — E1142 Type 2 diabetes mellitus with diabetic polyneuropathy: Secondary | ICD-10-CM | POA: Diagnosis not present

## 2015-04-30 DIAGNOSIS — Z794 Long term (current) use of insulin: Secondary | ICD-10-CM | POA: Diagnosis not present

## 2015-04-30 DIAGNOSIS — E782 Mixed hyperlipidemia: Secondary | ICD-10-CM | POA: Diagnosis not present

## 2015-04-30 MED ORDER — VICTOZA 18 MG/3ML ~~LOC~~ SOPN: 1.2000 mg | PEN_INJECTOR | Freq: Every day | SUBCUTANEOUS | Status: DC

## 2015-04-30 NOTE — Progress Notes (Signed)
Patient ID: [name], male   DOB: December 27, 1950, 64 y.o.   MRN: [id number]           Reason for Appointment: Consultation for Type 2 Diabetes  Referring physician: NARENDRA  History of Present Illness:          Date of diagnosis of type 2 diabetes mellitus: 2007       Background history:   He was diagnosed to have diabetes when he had an ulcer on his left third toe which led to amputation.  His glucose was apparently about 400 and he was started on insulin at that time He does not know if he has taken any oral hypoglycemic drugs in the past  Recent history:   INSULIN regimen is: Lantus 72 units at breakfast and supper.  Humalog 50 at breakfast, 30 at lunch and 50 at suppertime  Current blood sugar patterns and problems identified:  According to the patient his fasting blood sugars are persistently over 200  Blood sugars are generally much lower at lunchtime but he is taking 50 units of Humalog in the morning and frequently not eating any food.  Blood sugars are relatively higher at suppertime but variable at bedtime; not clear how often he is checking sugars  He has been taking about the same insulin for some time  He will sometimes take 30 units of Humalog at bedtime if his blood sugar is over 180; This may occasionally lead to symptoms of low sugars overnight  Although he tries to take his Humalog before eating he may sometimes forget  Non-insulin hypoglycemic drugs the patient is taking are: None      Side effects from medications have been: None  Compliance with the medical regimen: Fair  Hypoglycemia:   occasionally overnight as above  Glucose monitoring:  done up to 3  times a day         Glucometer:  Accu-Chek     Blood Glucose readings by recall  PREMEAL Breakfast Lunch Dinner Bedtime  Overall   Glucose range: 200-300 100 200 140   Median:         Self-care: The diet that the patient has been following is: None    Typical meal intake: Breakfast is  sometimes fruit like a banana, usually eating eggs, ham and grits at lunch.  Has snacks with cookies, popcorn.  Occasionally will have sweets, drinks Grapefruit or V-8 juice and diet drinks               Dietician consultations: Periodically at the clinic               Exercise: some walking, up to 30 minutes, 1 twice a week recently  Weight history: Previous range 260-410  Wt Readings from Last 3 Encounters:  04/30/15 406 lb 9.6 oz (184.433 kg)  04/16/15 409 lb 9.6 oz (185.793 kg)  03/12/15 399 lb 11.2 oz (181.303 kg)    Glycemic control:   Lab Results  Component Value Date   HGBA1C 8.7 03/12/2015   HGBA1C 8.4 12/13/2014   HGBA1C 8.2 09/19/2014   Lab Results  Component Value Date   MICROALBUR 4.6* 06/14/2014   LDLCALC 56 07/18/2014   CREATININE 1.61* 12/20/2014         Medication List       This list is accurate as of: 04/30/15 12:58 PM.  Always use your most recent med list.               ACCU-CHEK  AVIVA PLUS W/DEVICE Kit  1 each by Does not apply route 4 (four) times daily -  with meals and at bedtime.     ACCU-CHEK FASTCLIX LANCETS Misc  Check blood sugar 4 times a day before meals and bedtime dx code 250.02 insulin requiring     albuterol 108 (90 BASE) MCG/ACT inhaler  Commonly known as:  PROAIR HFA  Inhale 2 puffs into the lungs every 6 (six) hours as needed for wheezing or shortness of breath.     apixaban 5 MG Tabs tablet  Commonly known as:  ELIQUIS  Take 1 tablet (5 mg total) by mouth 2 (two) times daily.     aspirin 81 MG EC tablet  Take 1 tablet (81 mg total) by mouth daily.     atorvastatin 20 MG tablet  Commonly known as:  LIPITOR  Take 1 tablet (20 mg total) by mouth daily.     BESIVANCE 0.6 % Susp  Generic drug:  Besifloxacin HCl  Place 1 drop into both eyes 4 (four) times daily.     capsaicin 0.025 % cream  Commonly known as:  ZOSTRIX  Apply 1 application topically daily as needed (for pain).     docusate sodium 100 MG capsule    Commonly known as:  COLACE  Take 1 capsule (100 mg total) by mouth 2 (two) times daily.     enalapril 20 MG tablet  Commonly known as:  VASOTEC  Take 2 tablets (40 mg total) by mouth daily.     furosemide 40 MG tablet  Commonly known as:  LASIX  Take 1 tablet (40 mg total) by mouth 2 (two) times daily.     glucose blood test strip  Commonly known as:  ACCU-CHEK AVIVA PLUS  USE TO CHECK BLOOD SUGAR FOUR TIMES DAILY BEFORE MEALS AND AT BEDTIME     HUMALOG KWIKPEN 100 UNIT/ML KiwkPen  Generic drug:  insulin lispro  SEE NOTES     HYDROcodone-acetaminophen 5-325 MG tablet  Commonly known as:  NORCO/VICODIN  Take 1 and 1/2 tablets by mouth every 6 hours as needed for pain     Insulin Pen Needle 31G X 5 MM Misc  Use for insulin injection 5 times a day.     LANTUS SOLOSTAR 100 UNIT/ML Solostar Pen  Generic drug:  Insulin Glargine  ADMINISTER 74 UNITS UNDER THE SKIN TWICE DAILY     LUMIGAN 0.01 % Soln  Generic drug:  bimatoprost  Place 1 drop into both eyes at bedtime.     metoprolol tartrate 25 MG tablet  Commonly known as:  LOPRESSOR  Take 0.5 tablets (12.5 mg total) by mouth 2 (two) times daily.     omeprazole 20 MG capsule  Commonly known as:  PRILOSEC  TAKE 1 CAPSULE BY MOUTH DAILY     promethazine 25 MG tablet  Commonly known as:  PHENERGAN  TAKE 1/2 TO 1 TABLET BY MOUTH EVERY 6 HOURS AS NEEDED FOR NAUSEA     SIMBRINZA 1-0.2 % Susp  Generic drug:  Brinzolamide-Brimonidine  Place 1 drop into both eyes 2 (two) times daily. Take as instructed by Dr. Katy Fitch.     VICTOZA 18 MG/3ML Sopn  Generic drug:  Liraglutide  Inject 0.2 mLs (1.2 mg total) into the skin daily. Inject once daily at the same time     Vitamin D3 2000 UNITS capsule  Take 1 capsule (2,000 Units total) by mouth daily.        Allergies:  Allergies  Allergen  Reactions  . Vancomycin     REACTION: ARF    Past Medical History  Diagnosis Date  . Retinopathy   . Neuropathy, lower extremity   .  Hyperlipidemia   . GERD (gastroesophageal reflux disease)   . Erectile dysfunction   . Osteomyelitis of ankle and foot ([place])   . Hemorrhoids   . HEARING LOSS, SENSORINEURAL, BILATERAL 01/03/2007    Seen by ENT Dr. Orpah Greek D. Redmond Baseman 01/03/07  . Hypertension   . Asthma   . OSA on CPAP     Nocturnal polysomnogram on 01/21/2010 showed severe obstructive sleep apnea/hypopnea syndrome, AHI 74.1 per hour with non positional events, moderately loud snoring, and oxygen desaturation to a nadir of 78% on room air.  CPAP was successfully titrated to 17 CWP, AHI 1.1 per hour using a large ResMed Mirage Quattro full-face mask with heated humidifier. Bruxism was noted.   . Type II diabetes mellitus ([place])     w/complication NOS, type II  . Edema, macular, due to secondary diabetes ([place])   . DIABETIC FOOT ULCER 06/20/2009  . Arthritis     "elbows & knees" (12/13/2014)  . CKD (chronic kidney disease), stage III   . History of echocardiogram     a. 04/2008 Echo: EF 50-55%, abnl LV relaxation, mildly dil LA.  . Morbid obesity [facility])     Past Surgical History  Procedure Laterality Date  . Toe amputation Left 01/21/2006    S/P radical irrigation and debridement, left foot with third MTP joint amputation by Dr. Kathalene Frames. Mayer Camel.  . Amputation Left 12/15/2014    Procedure: LEFT SECOND TOE AMPUTATION ;  Surgeon: Dorna Leitz, MD;  Location: [facility];  Service: Orthopedics;  Laterality: Left;    Family History  Problem Relation Age of Onset  . Diabetes Mother     also 2 siblings  . Heart attack Father 12  . Throat cancer Brother     Social History:  reports that he quit smoking about 5 years ago. His smoking use included Cigarettes. He has a 15 pack-year smoking history. He has never used smokeless tobacco. He reports that he drinks about 6.6 oz of alcohol per week. He reports that he does not use illicit drugs.    Review of Systems    Lipid history: Appears to have had high triglycerides, on Lipitor alone      Lab Results  Component Value Date   CHOL 146 07/18/2014   HDL 32* 07/18/2014   LDLCALC 56 07/18/2014   TRIG 291* 07/18/2014   CHOLHDL 4.6 07/18/2014           Constitutional: no recent weight gain/loss, no complaints of unusual fatigue   Eyes: no history of blurred vision.  Most recent eye exam was    ENT: no difficulty swallowing  Cardiovascular: no chest pain or tightness on exertion.   Continues to have some leg swelling at times, maintained on Lasix for this.   Hypertension: Has had hypertension for a few years treated with enalapril, low-dose metoprolol  Respiratory: no cough/shortness of breath  Gastrointestinal:  Constipation present, has occasional nausea. No abdominal pain  Musculoskeletal: no muscle/joint pain, no back pain   Urological:   No frequency of urination or  nocturia  Skin: no rash or infections  Neurological: no headaches.  Has long-standing numbness in his feet.  No significant pain or tingling currently  Psychiatric: no symptoms of depression  Endocrine: No unusual fatigueor history of thyroid disease    Physical Examination:  BP 160/70 mmHg  Pulse 81  Temp(Src) 98.2 F (36.8 C)  Resp 18  Ht $R'6\' 6"'UK$  (1.981 m)  Wt 406 lb 9.6 oz (184.433 kg)  BMI 47.00 kg/m2  SpO2 98%  GENERAL:     he is very heavyset.       Patient has marked generalized obesity.   HEENT:         Eye exam shows normal external appearance. Fundus exam shows no evident retinopathy.  Oral exam shows normal mucosa .  NECK:   There is no lymphadenopathy Thyroid is not enlarged and no nodules felt.  Carotids are normal to palpation and no bruit heard LUNGS:         Chest is symmetrical. Lungs are clear to auscultation.Marland Kitchen   HEART:         Heart sounds:  S1 and S2 are normal. No murmur or click heard., no S3 or S4.   ABDOMEN:   There is no distention present. Liver and spleen are not palpable. No other mass or tenderness present.   NEUROLOGICAL:   Vibration sense is absent in  distal first toes. Ankle jerks are absent bilaterally.          Amputation of left third toe present.  Monofilament sensation absent on all toes, pedal pulses absent MUSCULOSKELETAL:  There is no swelling or deformity of the peripheral joints. Spine is normal to inspection.   EXTREMITIES:     There is trace left lower leg edema. No skin lesions present; onychomycosis of toes present SKIN:       No rash or lesions of concern.        ASSESSMENT:  Diabetes type 2, uncontrolled with morbid obesity  Apparently has been on insulin since diagnosis made which was probably established when he had a neuropathic foot ulcer in 2007   Currently the patient has been taking about 300 units of insulin a day and has had persistently high A1c levels of over 8% over the last few years. Currently not taking any medication for insulin resistance and has had progressive weight gain over the last few years also with his insulin regimen. Has had inadequate diabetes education, appears to be inconsistent with following diet Only recently is trying to walk a little.  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  HYPERLIPIDEMIA: Has persistently high triglycerides was pain improved with weight loss and better of his control  Dependent pedal edema: Likely to be from venous insufficiency  PLAN:     Start Victoza to help with improving various aspects of his diabetes control  including reduced appetite, reducing gastric emptying, reducing Glucagon activity and does feel especially help with postprandial control.  Reduce morning Lantus and increase evening Lantus which needs to be taken at bedtime  Switch Lantus to Toujeo when he runs out of his Lantus for more consistent control and possibly once a day dosage  HUMALOG doses will be changed as below, needs much smaller doses at breakfast and relatively larger doses at lunch and supper  Since he has a large supply of  Humalog will consider switching this to U-500 insulin when he finishes what he has at home.  This should be more effective in blood sugar control especially with getting overnight blood sugars controlled  Consider formal consultation with dietitian  Increase walking  Discussed watching carbohydrates, simple sugars and having balanced meals  Discussed timing of glucose monitoring, frequency and blood sugar targets  He is planning to see the podiatrist tomorrow for foot care which is important especially with his sensory loss from diabetic neuropathy and onychomycosis   Patient Instructions  Start VICTOZA injection as shown once daily at the same time of the day.  Dial the dose to 0.6 mg on the pen for the first week.  You may inject in the stomach, thigh or arm. You may experience nausea in the first few days which usually goes away.  You will feel fullness of the stomach with starting the medication and should try to keep the portions at meals small.  After 1 week increase the dose to 1.$RemoveB'2mg'deyIMlOn$  daily if no nausea present.   If any questions or concerns are present call the office or the [facility] helpline at [phone].   INSULIN doses:  LANTUS insulin: Take 60 units in the morning and 90 units at bedtime HUMALOG insulin: Take 15 units in the morning only when eating a fruit Take 50 units before lunch and 40 units before supper.  May reduce the dose by 10 units if eating a small meal or  increase the dose by 10 units if eating more starchy foods. Did not take any Humalog insulin at bedtime  Try to walk regularly as much as possible  When Lantus is finished will switch the insulin to Toujeo, please call for prescription    Counseling time on subjects discussed above is over 50% of today's 60 minute visit   Bunny Lowdermilk 04/30/2015, 12:58 PM   Note: This office note was prepared with Estate agent. Any transcriptional errors that result from this process are unintentional.

## 2015-04-30 NOTE — Telephone Encounter (Signed)
FYI.  The on call nurse Neoma Laming) called and states that [name] had questions regarding his change in his insulin dose.  I spoke to [name].  He just wanted to clarify his directions.  We discussed the written out instructions.  He has already taken his humalog before supper.  Blood sugar prior to eating was 135.  Per instructions, he is supposed to give himself 90 units of lantus before bed and per pt he is to start the [place].   I spoke to Dr Letta Median and she informed me to have him just take his regular lantus dose (of 72 units) this pm and hold on starting the victoza tonight.  [name] informed and instructed to call the office in the am for clarification on his medications.

## 2015-04-30 NOTE — Patient Instructions (Signed)
Start VICTOZA injection as shown once daily at the same time of the day.  Dial the dose to 0.6 mg on the pen for the first week.  You may inject in the stomach, thigh or arm. You may experience nausea in the first few days which usually goes away.  You will feel fullness of the stomach with starting the medication and should try to keep the portions at meals small.  After 1 week increase the dose to 1.2mg  daily if no nausea present.   If any questions or concerns are present call the office or the [place] helpline at [phone].   INSULIN doses:  LANTUS insulin: Take 60 units in the morning and 90 units at bedtime HUMALOG insulin: Take 15 units in the morning only when eating a fruit Take 50 units before lunch and 40 units before supper.  May reduce the dose by 10 units if eating a small meal or increase the dose by 10 units if eating more starchy foods. Did not take any Humalog insulin at bedtime  Try to walk regularly as much as possible  When Lantus is finished will switch the insulin to [facility], please call for prescription

## 2015-04-30 NOTE — Telephone Encounter (Signed)
He needs to follow instructions given on his office visit regardless of blood sugar levels and call only if low.  Needs to start Victoza as directed

## 2015-05-01 ENCOUNTER — Ambulatory Visit (INDEPENDENT_AMBULATORY_CARE_PROVIDER_SITE_OTHER): Payer: Medicare Other | Admitting: Podiatry

## 2015-05-01 ENCOUNTER — Telehealth: Payer: Self-pay | Admitting: *Deleted

## 2015-05-01 ENCOUNTER — Ambulatory Visit (INDEPENDENT_AMBULATORY_CARE_PROVIDER_SITE_OTHER): Payer: Medicare Other

## 2015-05-01 ENCOUNTER — Other Ambulatory Visit: Payer: Self-pay | Admitting: Endocrinology

## 2015-05-01 ENCOUNTER — Encounter: Payer: Self-pay | Admitting: Podiatry

## 2015-05-01 VITALS — BP 164/99 | HR 69 | Resp 12

## 2015-05-01 DIAGNOSIS — L89891 Pressure ulcer of other site, stage 1: Secondary | ICD-10-CM

## 2015-05-01 DIAGNOSIS — M79673 Pain in unspecified foot: Secondary | ICD-10-CM | POA: Diagnosis not present

## 2015-05-01 DIAGNOSIS — R52 Pain, unspecified: Secondary | ICD-10-CM

## 2015-05-01 DIAGNOSIS — B351 Tinea unguium: Secondary | ICD-10-CM

## 2015-05-01 DIAGNOSIS — E1142 Type 2 diabetes mellitus with diabetic polyneuropathy: Secondary | ICD-10-CM | POA: Diagnosis not present

## 2015-05-01 DIAGNOSIS — L97521 Non-pressure chronic ulcer of other part of left foot limited to breakdown of skin: Secondary | ICD-10-CM

## 2015-05-01 MED ORDER — SILVER SULFADIAZINE 1 % EX CREA: 1.0000 "application " | TOPICAL_CREAM | Freq: Every day | CUTANEOUS | Status: DC

## 2015-05-01 NOTE — Patient Instructions (Signed)
Apply Silvadene cream to skin ulcer and fifth toe left foot daily and cover with gauze Wear surgical shoe on left foot Vascular lab we'll call you to schedule an appointment to check circulation in your feet and legs If you develop any sudden fever, swelling, redness, pain, drainage present to emergency department  Diabetes and Foot Care Diabetes may cause you to have problems because of poor blood supply (circulation) to your feet and legs. This may cause the skin on your feet to become thinner, break easier, and heal more slowly. Your skin may become dry, and the skin may peel and crack. You may also have nerve damage in your legs and feet causing decreased feeling in them. You may not notice minor injuries to your feet that could lead to infections or more serious problems. Taking care of your feet is one of the most important things you can do for yourself.  HOME CARE INSTRUCTIONS  Wear shoes at all times, even in the house. Do not go barefoot. Bare feet are easily injured.  Check your feet daily for blisters, cuts, and redness. If you cannot see the bottom of your feet, use a mirror or ask someone for help.  Wash your feet with warm water (do not use hot water) and mild soap. Then pat your feet and the areas between your toes until they are completely dry. Do not soak your feet as this can dry your skin.  Apply a moisturizing lotion or petroleum jelly (that does not contain alcohol and is unscented) to the skin on your feet and to dry, brittle toenails. Do not apply lotion between your toes.  Trim your toenails straight across. Do not dig under them or around the cuticle. File the edges of your nails with an emery board or nail file.  Do not cut corns or calluses or try to remove them with medicine.  Wear clean socks or stockings every day. Make sure they are not too tight. Do not wear knee-high stockings since they may decrease blood flow to your legs.  Wear shoes that fit properly and  have enough cushioning. To break in new shoes, wear them for just a few hours a day. This prevents you from injuring your feet. Always look in your shoes before you put them on to be sure there are no objects inside.  Do not cross your legs. This may decrease the blood flow to your feet.  If you find a minor scrape, cut, or break in the skin on your feet, keep it and the skin around it clean and dry. These areas may be cleansed with mild soap and water. Do not cleanse the area with peroxide, alcohol, or iodine.  When you remove an adhesive bandage, be sure not to damage the skin around it.  If you have a wound, look at it several times a day to make sure it is healing.  Do not use heating pads or hot water bottles. They may burn your skin. If you have lost feeling in your feet or legs, you may not know it is happening until it is too late.  Make sure your health care provider performs a complete foot exam at least annually or more often if you have foot problems. Report any cuts, sores, or bruises to your health care provider immediately. SEEK MEDICAL CARE IF:   You have an injury that is not healing.  You have cuts or breaks in the skin.  You have an ingrown nail.  You notice redness on your legs or feet.  You feel burning or tingling in your legs or feet.  You have pain or cramps in your legs and feet.  Your legs or feet are numb.  Your feet always feel cold. SEEK IMMEDIATE MEDICAL CARE IF:   There is increasing redness, swelling, or pain in or around a wound.  There is a red line that goes up your leg.  Pus is coming from a wound.  You develop a fever or as directed by your health care provider.  You notice a bad smell coming from an ulcer or wound.   This information is not intended to replace advice given to you by your health care provider. Make sure you discuss any questions you have with your health care provider.   Document Released: 06/26/2000 Document Revised:  03/01/2013 Document Reviewed: 12/06/2012 Elsevier Interactive Patient Education [employer].

## 2015-05-01 NOTE — Telephone Encounter (Signed)
Pharmacy called for clarification of pt's insulin, after reviewing chart found that pt saw dr Dwyane Dee 10/18, ask pharmacy to notify dr Ronnie Derby office for insulin instructions, do you agree?

## 2015-05-01 NOTE — Telephone Encounter (Signed)
Yes... They need to get in touch with Dr. Dwyane Dee. Thank you

## 2015-05-01 NOTE — Progress Notes (Signed)
   Subjective:    Patient ID: [name], male    DOB: 11-15-1950, 64 y.o.   MRN: [id number]  HPI    This patient presents today complaining of long thick toenails are comfortable wear shoes he says the nails are becoming thicker and longer more uncomfortable over time and he has not been able to trim the nails. He did not mention any complaint about a skin lesion on the fifth left toe, however, when questioned how long the ulceration the fifth left toe was present he estimated approximately 2 weeks. Denies any treatment for this area Patient has a history of ulceration and amputation and is a known diabetic  Review of Systems  Respiratory: Positive for shortness of breath.   Cardiovascular: Positive for leg swelling.  Genitourinary: Positive for frequency.  Musculoskeletal: Positive for joint swelling and gait problem.  Neurological: Positive for weakness and numbness.       Objective:   Physical Exam  Orientated 3  Vascular: DP right 0/4 DP left 1/4 PT right 1/4 PT pulse left 0/4 Capillary reflex within 3 seconds bilaterally  Neurological: Sensation to 10 g monofilament wire intact 0/5 bilaterally Vibratory sensation nonreactive bilaterally Ankle reflex equal and reactive bilaterally  Dermatological: Toenails 1-5 right and 1, 4, 5 left are elongated, brittle, discolored, hypertrophic 5 mm superficial ulcer dorsal lateral aspect of fifth left toe with a granular base that does not probe to bone. Fifth left toe is mildly edematous, without any active drainage or warmth or malodor  Musculoskeletal: Amputated second and third left toes No restriction ankle, subtalar, midtarsal joints bilaterally  X-ray examination left foot dated 05/01/2015  Amputation second and third left toes Cystic area lateral aspect of the distal interphalangeal joint versus possible beginning erosion No emphysema noted fifth toe  Radiographic impression: Decreased bone density lateral  distal interphalangeal joint fifth toe left foot Cystic degeneration versus possible lysis of bone     Assessment & Plan:   Assessment: Decrease pedal pulses suggestive of peripheral toe disease Diabetic peripheral neuropathy Superficial ulceration fifth left toe X-ray examination is not conclusive for active bone infection at this time Mycotic toenails 8  Plan: Today I reviewed the results of examination with patient today. I made him aware that he has a skin ulcer in the fifth left toe that needed active treatment. He'll begin applying Silvadene cream to the area and a surgical shoe was dispensed for him to wear in the left foot Is referred to the vascular lab for ABIs and TBI's for the indication of: Diabetic with skin ulceration and nonpalpable pedal pulses The skin ulcer on the fifth left toe was debrided and dressed with Silvadene and gauze dressing Patient advised that if he notices any sudden increase in pain, swelling, redness present to emergency department Rx Silvadene Dispensed surgical shoe 1 for left  The toenails 8 were debrided mechanically and electrically without a bleeding  Reappoint 7 days and re-x-ray left toe area

## 2015-05-02 ENCOUNTER — Telehealth: Payer: Self-pay | Admitting: Endocrinology

## 2015-05-02 ENCOUNTER — Ambulatory Visit (INDEPENDENT_AMBULATORY_CARE_PROVIDER_SITE_OTHER): Payer: Medicare Other | Admitting: Pharmacist

## 2015-05-02 ENCOUNTER — Encounter: Payer: Self-pay | Admitting: Podiatry

## 2015-05-02 DIAGNOSIS — Z794 Long term (current) use of insulin: Secondary | ICD-10-CM | POA: Diagnosis not present

## 2015-05-02 DIAGNOSIS — E1165 Type 2 diabetes mellitus with hyperglycemia: Secondary | ICD-10-CM | POA: Diagnosis not present

## 2015-05-02 DIAGNOSIS — I10 Essential (primary) hypertension: Secondary | ICD-10-CM

## 2015-05-02 MED ORDER — ACETAMINOPHEN 500 MG PO TABS: 500.0000 mg | ORAL_TABLET | ORAL | Status: AC | PRN

## 2015-05-02 NOTE — Telephone Encounter (Signed)
Patient would like to know if he still suppose to take Lantus, please advise

## 2015-05-02 NOTE — Patient Instructions (Addendum)
LANTUS insulin: Take 60 units in the morning and 90 units at bedtime HUMALOG insulin: Take 15 units in the morning only when eating a fruit Take 50 units before lunch and 40 units before supper. May reduce the dose by 10 units if eating a small meal or increase the dose by 10 units if eating more starchy foods. Did not take any Humalog insulin at bedtime Please continue to take Victoza 0.6 mg then increase to 1.2 mg after 1 week  Remember the goals we set during this visit: -Try to stop drinking regular soda -Increase walking to 15-30 minutes three times a week  -Increase good diet habits as discussed in the handouts on snacks and healthy food choices.  Stop Aspirin 81 mg For pain you can take up to 2000 mg of tylenol as needed per day.  (Four 500 mg tablets per day)

## 2015-05-02 NOTE — Telephone Encounter (Signed)
Yes, continue Lantus as on patient instructions and switch to [facility] when this is finished

## 2015-05-02 NOTE — Telephone Encounter (Signed)
Please see below and advise.

## 2015-05-02 NOTE — Progress Notes (Signed)
S:    Patient arrives in good spirits.    Presents for diabetes evaluation and management.   Patient reports adherence with medications. Current diabetes medications include Humalog, Lantus, Victoza. Pt had visit with Dr Dwyane Dee at Endocrinology on 10/18 and his lantus and humalog doses were changed as well as his victoza.  Pt states confusion on current humalog dosing and is unaware if not to take humalog if he is not eating.   Pt states starting victoza yesterday.   Patient reports hypoglycemic events. Reports 1-2 events over the past 2 weeks.  Pt reports understanding how to treat hypoglycemia.   Patient reported dietary habits: Breakfast:eating banana grapefruit juice, or orange juice.   Lunch: Breakfast bowl or sandwhich.   Water or Soda. Supper: Frozen Chicken Wings [employer], water, diet pepsi, pinto beans Sugar free soda, but does have some regular sodas.  Snacks:  States tries to eat veggie chips, popcorn.  States usually eating at night after dinner for snacks.   Patient reported exercise habits: Pt thinking about going back to the [facility]. Pt states that gets dizzy and feels like is going to fall. Pt states that some days feels good and falls. Set goal to walk outside 15-30 mins 3x/week.    Patient reports nocturia 2-3x/night. Patient reports neuropathy. Patient reports visual changes.  Pt reports sometimes not walking around the house with his shoes on.   SMBGs  FBGs 200s ACL: 130-160s ACS: 140s QHS: doesn't check a lot.   BP: 140/65 (self monitored this past Tuesday)  O:  . Lab Results  Component Value Date   HGBA1C 8.7 03/12/2015      A/P: Diabetes diagnosed currently uncontrolled.   reports hypoglycemic events and is able to verbalize appropriate hypoglycemia management plan.  reports adherence with medication. Counseled pt on diet and exercise and provided/discussed handouts on healthy food choices with diabetes, smart snacks with DM, and hypoglycemia.  Set pt goals to  d/c regular sodas completely, increase walking to 15-30 mins 3x/week, and to increase healthy food choices such as using olive oil, eating more vegetables, baking most foods instead of frying, and eating lean meats such as chicken and [place].   Will continue insulin/victoza dosing per Dr Dwyane Dee at Endocrinology as follows:  "LANTUS insulin: Take 60 units in the morning and 90 units at bedtime HUMALOG insulin: Take 15 units in the morning only when eating a fruit. Take 50 units before lunch and 40 units before supper. May reduce the dose by 10 units if eating a small meal or increase the dose by 10 units if eating more starchy foods.Did not take any Humalog insulin at bedtime.    Start Victoza 0.6 mg then increase to 1.2 mg after 1 week.  When Lantus is finished will switch the insulin to [employer]  Will consider addition of metformin at future visit for CV benefit with diabetes if renal function remains stable.  Will d/c ASA 81 mg since pt is on Eliquis and only taking ASA for primary prevention. Dr Dareen Piano confirmed d/c of ASA 81 mg.   Pain: Pain uncontrolled on current regimen and pt would like to take Tylenol PM.   Instructed pt to d/c tylenol PM and to take Tylenol Extra Strength 500 mg 1 tab Q4-6H PRN (Maximum daily dosage of 2000 mg since taking Norco).   Next A1C anticipated 06/2015.  Written patient instructions provided. Total time in face to face counseling 30 minutes.  Patient seen with Mannie Stabile, PharmD.

## 2015-05-03 ENCOUNTER — Telehealth: Payer: Self-pay | Admitting: Endocrinology

## 2015-05-03 MED ORDER — METOPROLOL SUCCINATE ER 25 MG PO TB24: 25.0000 mg | ORAL_TABLET | Freq: Every day | ORAL | Status: DC

## 2015-05-03 NOTE — Telephone Encounter (Signed)
Status: Signed       Expand All Collapse All   Patient Name: [name] Gender: Male DOB: October 14, 1950 Age: 64 Y 56 M 26 D Return Phone Number: [phone] (Primary) Address: City/State/Zip: [place] [facility] Night - Client Client Site [facility] Physician Elayne Snare Contact Type Call Call Type Triage / Clinical Relationship To Patient Self Return Phone Number [phone] (Primary) Chief Complaint Medication Question (non symptomatic) Initial Comment Caller states he was prescribed Victoza today and was unsure of dosage instructions. Nurse Assessment Nurse: Einar Gip, RN, Neoma Laming Date/Time (Eastern Time): 04/30/2015 5:47:11 PM Confirm and document reason for call. If symptomatic, describe symptoms. ---Caller states he is confused about how much Lantus to take and whether or not he should continue his Humalog with the Vitoza. Attempted to contact back line and call went to the call center. Has the patient traveled out of the country within the last 30 days? ---Not Applicable Does the patient have any new or worsening symptoms? ---No Nurse: Wynetta Emery, RN, Baker Janus Date/Time (Eastern Time): 04/30/2015 6:43:53 PM Confirm and document reason for call. If symptomatic, describe symptoms. ---Toua states he was given RX of Victoza Insulin pin and is to take 0.6 Units tonight at hs Just want to confirm he is doing it right Has the patient traveled out of the country within the last 30 days? ---No Does the patient have any new or worsening symptoms? ---No Please document clinical information provided and list any resource used. ---Nurse explained how to dial the pin to correct dose and he said that is what the doctor showed him in the office just wants to do it right Nurse assured him he was going to do it right just make sure he dials up the the right dose needed. Guidelines Guideline Title Affirmed Question Affirmed Notes Nurse Date/Time Eilene Ghazi

## 2015-05-03 NOTE — Telephone Encounter (Signed)
Detailed message left on patients voice mail instructing him to follow the directions on his AVS.

## 2015-05-03 NOTE — Addendum Note (Signed)
Addended by: Forde Dandy on: 05/03/2015 03:16 PM   Modules accepted: Orders, Medications

## 2015-05-08 ENCOUNTER — Other Ambulatory Visit: Payer: Self-pay | Admitting: Internal Medicine

## 2015-05-08 ENCOUNTER — Telehealth: Payer: Self-pay

## 2015-05-08 ENCOUNTER — Encounter: Payer: Self-pay | Admitting: Podiatry

## 2015-05-08 ENCOUNTER — Ambulatory Visit (INDEPENDENT_AMBULATORY_CARE_PROVIDER_SITE_OTHER): Payer: Medicare Other | Admitting: Podiatry

## 2015-05-08 ENCOUNTER — Ambulatory Visit (INDEPENDENT_AMBULATORY_CARE_PROVIDER_SITE_OTHER): Payer: Medicare Other

## 2015-05-08 VITALS — BP 122/82 | HR 74 | Temp 96.4°F | Resp 12

## 2015-05-08 DIAGNOSIS — R52 Pain, unspecified: Secondary | ICD-10-CM | POA: Diagnosis not present

## 2015-05-08 DIAGNOSIS — E1142 Type 2 diabetes mellitus with diabetic polyneuropathy: Secondary | ICD-10-CM

## 2015-05-08 DIAGNOSIS — L84 Corns and callosities: Secondary | ICD-10-CM | POA: Diagnosis not present

## 2015-05-08 NOTE — Telephone Encounter (Signed)
Returned call to [employer] purposes, the pharmacy would like to know the max dose per day that patient can take of Humalog Claiborne Rigg).  The current sig is 50 UNITS BEFORE BREAKFAST, 30U BEFORE LUNCH,55U BEFORE DINNER, PLUS 1 UNIT FOR EVERY 10MG /DL ABOVE 100MG /DL,REDUCE DOSE BY 10 ON EXERCISE DAYS.  Pharmacy was informed that pt's insulin is now being prescribed by DrKumar with [facility]. Will leave phone and inbasket message with [place] to follow up. Phone call complete.[name]10/26/20162:22 PM

## 2015-05-08 NOTE — Telephone Encounter (Signed)
[place] pharmacy called regarding [employer]. Please call back.

## 2015-05-08 NOTE — Patient Instructions (Signed)
Today your examination demonstrated a callused in the previous ulcer in the fifth toe left foot The underlying ulcer appears to have healed The x-ray today showed no change from the previous x-ray Discontinue the use of Silvadene cream on the fifth toe Apply Vaseline to the fifth toe on the left foot daily and wear white cotton sock Continue to wear surgical shoe on the left foot Reappoint 2 weeks

## 2015-05-08 NOTE — Telephone Encounter (Signed)
Faxed orders lower ext art duplex

## 2015-05-09 ENCOUNTER — Other Ambulatory Visit: Payer: Self-pay | Admitting: *Deleted

## 2015-05-09 NOTE — Progress Notes (Signed)
Patient ID: [name], male   DOB: January 04, 1951, 64 y.o.   MRN: [id number]  Subjective: Patient presents today for follow-up care for ulceration fifth toe left foot with initial presentation on 05/01/2015. At initial presentation patient had 2 week history of this lesion. The area was initially debrided and Silvadene dressing applied to the area. A surgical shoe was dispensed Patient was referred for vascular lab on the initial visit with the results pending.. At today's visit patient states that the fifth toe ulcer is looking better  Objective: Orientated 3 DP pulse right 0/4 and DP pulse left 1/4 PT pulse right 1/4 and PT pulse left 0/4 Capillary reflex within 3 seconds  Neurological: Sensation to 10 g monofilament wire intact 0/5 bilaterally  Dermatological: Fifth toe left has mild edema. There is slight keratoses on the lateral fifth toe left with a granular base that after debridement remains closed. There is no erythema, warmth, or drainage  X-ray examination left foot dated 05/08/2015  Erosion lateral distal interphalangeal joint fifth toe There is no emphysema noted in the area There is no progression in the erosion from the baseline x-ray of 05/01/2015 Amputation second and third toes  Radiographic impression: No progression of erosion lateral distal interphalangeal joints fifth toe left foot No x-ray conclusive evidence of osteomyelitis Correlate with clinical findings  Assessment: Pre-ulcerative callus fifth toe left foot X-ray shows no progression of erosion lateral distal interphalangeal joint fifth toe  Plan: Debrided pre-ulcerative callus At this time because lesion is closed, DC Silvadene Patient advised to apply Vaseline to the lateral border fifth toe left foot and wear white cotton sock Continue wearing surgical shoe on the left foot  Reevaluate 2 weeks

## 2015-05-17 ENCOUNTER — Other Ambulatory Visit (INDEPENDENT_AMBULATORY_CARE_PROVIDER_SITE_OTHER): Payer: Medicare Other

## 2015-05-17 DIAGNOSIS — Z794 Long term (current) use of insulin: Secondary | ICD-10-CM

## 2015-05-17 DIAGNOSIS — E1165 Type 2 diabetes mellitus with hyperglycemia: Secondary | ICD-10-CM | POA: Diagnosis not present

## 2015-05-17 LAB — URINALYSIS, ROUTINE W REFLEX MICROSCOPIC
Bilirubin Urine: NEGATIVE
Ketones, ur: NEGATIVE
LEUKOCYTES UA: NEGATIVE
Nitrite: NEGATIVE
PH: 5.5 (ref 5.0–8.0)
TOTAL PROTEIN, URINE-UPE24: 100 — AB
URINE GLUCOSE: NEGATIVE
UROBILINOGEN UA: 0.2 (ref 0.0–1.0)

## 2015-05-17 LAB — BASIC METABOLIC PANEL
BUN: 19 mg/dL (ref 6–23)
CHLORIDE: 101 meq/L (ref 96–112)
CO2: 30 meq/L (ref 19–32)
CREATININE: 1.88 mg/dL — AB (ref 0.40–1.50)
Calcium: 9.6 mg/dL (ref 8.4–10.5)
GFR: 46.52 mL/min — ABNORMAL LOW (ref >60.00)
Glucose, Bld: 174 mg/dL — ABNORMAL HIGH (ref 70–99)
POTASSIUM: 4.5 meq/L (ref 3.5–5.1)
Sodium: 137 mEq/L (ref 135–145)

## 2015-05-17 LAB — MICROALBUMIN / CREATININE URINE RATIO
CREATININE, U: 358.6 mg/dL
MICROALB UR: 89.3 mg/dL — AB (ref 0.0–1.9)
Microalb Creat Ratio: 24.9 mg/g (ref 0.0–30.0)

## 2015-05-18 LAB — FRUCTOSAMINE: Fructosamine: 250 umol/L (ref 0–285)

## 2015-05-19 ENCOUNTER — Other Ambulatory Visit: Payer: Self-pay | Admitting: Internal Medicine

## 2015-05-22 ENCOUNTER — Other Ambulatory Visit: Payer: Self-pay | Admitting: *Deleted

## 2015-05-22 ENCOUNTER — Ambulatory Visit (INDEPENDENT_AMBULATORY_CARE_PROVIDER_SITE_OTHER): Payer: Medicare Other | Admitting: Endocrinology

## 2015-05-22 ENCOUNTER — Ambulatory Visit (INDEPENDENT_AMBULATORY_CARE_PROVIDER_SITE_OTHER): Payer: Medicare Other | Admitting: Podiatry

## 2015-05-22 ENCOUNTER — Encounter: Payer: Self-pay | Admitting: Endocrinology

## 2015-05-22 ENCOUNTER — Encounter: Payer: Self-pay | Admitting: Podiatry

## 2015-05-22 VITALS — BP 115/70 | HR 102 | Resp 14

## 2015-05-22 VITALS — BP 118/66 | HR 75 | Temp 98.2°F | Resp 16 | Ht 78.0 in | Wt 395.4 lb

## 2015-05-22 DIAGNOSIS — L89891 Pressure ulcer of other site, stage 1: Secondary | ICD-10-CM | POA: Diagnosis not present

## 2015-05-22 DIAGNOSIS — E1165 Type 2 diabetes mellitus with hyperglycemia: Secondary | ICD-10-CM | POA: Diagnosis not present

## 2015-05-22 DIAGNOSIS — Z794 Long term (current) use of insulin: Secondary | ICD-10-CM | POA: Diagnosis not present

## 2015-05-22 DIAGNOSIS — L97521 Non-pressure chronic ulcer of other part of left foot limited to breakdown of skin: Secondary | ICD-10-CM

## 2015-05-22 DIAGNOSIS — L84 Corns and callosities: Secondary | ICD-10-CM

## 2015-05-22 DIAGNOSIS — E1142 Type 2 diabetes mellitus with diabetic polyneuropathy: Secondary | ICD-10-CM

## 2015-05-22 MED ORDER — INSULIN GLARGINE 300 UNIT/ML ~~LOC~~ SOPN: 74.0000 [IU] | PEN_INJECTOR | Freq: Two times a day (BID) | SUBCUTANEOUS | Status: DC

## 2015-05-22 NOTE — Patient Instructions (Addendum)
LANTUS insulin: Take 50 units in the morning and 100 units at bedtime   HUMALOG insulin: Take 15 units in the morning only when eating a fruit  Take 40 units before lunch and 45 units before supper.   Take 1 in am and 1/2 Enalapril in pm  Check blood sugars on waking up 3-4  times a week Also check blood sugars about 2 hours after a meal and do this after different meals by rotation  Recommended blood sugar levels on waking up is 90-130 and about 2 hours after meal is 130-160  Please bring your blood sugar monitor to each visit, thank you

## 2015-05-22 NOTE — Progress Notes (Signed)
Subjective:     Patient ID: [name], male   DOB: Jul 25, 1950, 64 y.o.   MRN: [id number]  HPI This patient presents to the office for continued evaluation of ulcer on fifth toe left foot.  He has soaked and bandaged his foot since his last visit.  He has no drainage from toe.  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 He presents wearing surgical shoe.     Review of Systems     Objective:   Physical Exam GENERAL APPEARANCE: Alert, conversant. Appropriately groomed. No acute distress.  VASCULAR: Pedal pulses are not  palpable at  [facility] and PT bilateral.  Capillary refill time is immediate to all digits,   NEUROLOGIC: sensation is absent  to 5.07 monofilament at 5/5 sites bilateral.  Light touch is intact bilateral, Muscle strength normal.  MUSCULOSKELETAL: acceptable muscle strength, tone and stability bilateral.  Intrinsic muscluature intact bilateral.  Rectus appearance of foot and digits noted bilateral.   DERMATOLOGIC: skin color, texture, and turgor are within normal limits.  No preulcerative lesions or ulcers  are seen, no interdigital maceration noted.  No open lesions present.  Digital nails are asymptomatic. No drainage noted. Blackned fifth toe left foot with 2 mm. X 5 mm. Lesion on lateral aspect fifth toe left foot. No malodor noted.  No drainage redness or infection noted.      Assessment:     Diabetic Ulcer fifth toe left foot.     Plan:     Debride ulcer fifth toe left foot.  Silvadene/.DSD. RTC 2 weeks.

## 2015-05-22 NOTE — Progress Notes (Signed)
Patient ID: [name], male   DOB: 05-Jan-1951, 64 y.o.   MRN: [id number]           Reason for Appointment: Follow-up for Type 2 Diabetes  Referring physician: NARENDRA  History of Present Illness:          Date of diagnosis of type 2 diabetes mellitus: 2007       Background history:   He was diagnosed to have diabetes when he had an ulcer on his left third toe which led to amputation.  His glucose was apparently about 400 and he was started on insulin at that time He does not know if he has taken any oral hypoglycemic drugs in the past His A1c in 2016 has been consistently over 8%  Recent history:   INSULIN regimen is:   LANTUS insulin: Take 60 units in the morning and 90 units at bedtime  HUMALOG insulin: Take 15 units in the morning only when eating a fruit Take 50 units before lunch and 40 units before supper.   On his initial consultation was started on Victoza and his insulin doses were adjusted with lower basal insulin in the morning and higher in the evening He has tolerated the Victoza although still trying to get over some nausea at times. With this he has lost weight  Current blood sugar patterns and problems identified:  He did bring his monitor for download today and has been checking glucose at various times  FASTING blood sugars are mostly high although has a couple of readings near normal; highest readings are usually when he goes off his diet and eat snacks at night  Blood sugars are variably high around lunchtime  Blood sugars generally are much lower in the afternoon  Blood sugars are being checked periodically after supper and these are variably high also  Average glucose for the last 2 weeks is 198  He has not had any recent diabetes education  Was supposed to switch Lantus to Toujeo but has not done so yet  Non-insulin hypoglycemic drugs the patient is taking are: None      Side effects from medications have been: None  Compliance with  the medical regimen: Fair  Hypoglycemia:   occasionally overnight as above  Glucose monitoring:  done up to 3  times a day         Glucometer:  Accu-Chek     Blood Glucose readings by   Mean values apply above for all meters except median for One Touch  PRE-MEAL Fasting Lunch  afternoon  Bedtime Overall  Glucose range:  127-313   149-262   58-345   141-324    Mean/median:  221    139   210   198     Self-care: The diet that the patient has been following is: None    Typical meal intake: Breakfast is sometimes fruit like a banana, usually eating eggs, ham and grits at lunch.   Has snacks with cookies, popcorn, less sweets recently He drinks grapefruit or V-8 juice and diet drinks                Dietician consultations: Periodically at the clinic               Exercise: walking up to 30 minutes, 3x/week recently  Weight history: Previous range 260-410  Wt Readings from Last 3 Encounters:  05/22/15 395 lb 6.4 oz (179.352 kg)  04/30/15 406 lb 9.6 oz (184.433 kg)  04/16/15 409  lb 9.6 oz (185.793 kg)    Glycemic control:   Lab Results  Component Value Date   HGBA1C 8.7 03/12/2015   HGBA1C 8.4 12/13/2014   HGBA1C 8.2 09/19/2014   Lab Results  Component Value Date   MICROALBUR 89.3* 05/17/2015   LDLCALC 56 07/18/2014   CREATININE 1.88* 05/17/2015         Medication List       This list is accurate as of: 05/22/15 12:32 PM.  Always use your most recent med list.               ACCU-CHEK AVIVA PLUS W/DEVICE Kit  1 each by Does not apply route 4 (four) times daily -  with meals and at bedtime.     ACCU-CHEK FASTCLIX LANCETS Misc  Check blood sugar 4 times a day before meals and bedtime dx code 250.02 insulin requiring     acetaminophen 500 MG tablet  Commonly known as:  TYLENOL  Take 1 tablet (500 mg total) by mouth every 4 (four) hours as needed. Maximum Daily Dosage of 2000 mg     albuterol 108 (90 BASE) MCG/ACT inhaler  Commonly known as:  PROAIR HFA    Inhale 2 puffs into the lungs every 6 (six) hours as needed for wheezing or shortness of breath.     atorvastatin 20 MG tablet  Commonly known as:  LIPITOR  Take 1 tablet (20 mg total) by mouth daily.     BESIVANCE 0.6 % Susp  Generic drug:  Besifloxacin HCl  Place 1 drop into both eyes 4 (four) times daily.     capsaicin 0.025 % cream  Commonly known as:  ZOSTRIX  Apply 1 application topically daily as needed (for pain).     docusate sodium 100 MG capsule  Commonly known as:  COLACE  Take 1 capsule (100 mg total) by mouth 2 (two) times daily.     ELIQUIS 5 MG Tabs tablet  Generic drug:  apixaban  TAKE 1 TABLET(5 MG) BY MOUTH TWICE DAILY     enalapril 20 MG tablet  Commonly known as:  VASOTEC  Take 2 tablets (40 mg total) by mouth daily.     furosemide 40 MG tablet  Commonly known as:  LASIX  Take 1 tablet (40 mg total) by mouth 2 (two) times daily.     glucose blood test strip  Commonly known as:  ACCU-CHEK AVIVA PLUS  USE TO CHECK BLOOD SUGAR FOUR TIMES DAILY BEFORE MEALS AND AT BEDTIME     HUMALOG KWIKPEN 100 UNIT/ML KiwkPen  Generic drug:  insulin lispro  SEE NOTES     HYDROcodone-acetaminophen 5-325 MG tablet  Commonly known as:  NORCO/VICODIN  Take 1 and 1/2 tablets by mouth every 6 hours as needed for pain     Insulin Pen Needle 31G X 5 MM Misc  Use for insulin injection 5 times a day.     LANTUS SOLOSTAR 100 UNIT/ML Solostar Pen  Generic drug:  Insulin Glargine  ADMINISTER 74 UNITS UNDER THE SKIN TWICE DAILY     LUMIGAN 0.01 % Soln  Generic drug:  bimatoprost  Place 1 drop into both eyes at bedtime.     metoprolol succinate 25 MG 24 hr tablet  Commonly known as:  TOPROL-XL  Take 1 tablet (25 mg total) by mouth daily.     metoprolol tartrate 25 MG tablet  Commonly known as:  LOPRESSOR     omeprazole 20 MG capsule  Commonly known as:  PRILOSEC  TAKE 1 CAPSULE BY MOUTH DAILY     promethazine 25 MG tablet  Commonly known as:  PHENERGAN  TAKE 1/2  TO 1 TABLET BY MOUTH EVERY 6 HOURS AS NEEDED FOR NAUSEA     silver sulfADIAZINE 1 % cream  Commonly known as:  SILVADENE  Apply 1 application topically daily.     SIMBRINZA 1-0.2 % Susp  Generic drug:  Brinzolamide-Brimonidine  Place 1 drop into both eyes 2 (two) times daily. Take as instructed by Dr. Katy Fitch.     VICTOZA 18 MG/3ML Sopn  Generic drug:  Liraglutide  Inject 0.2 mLs (1.2 mg total) into the skin daily. Inject once daily at the same time     Vitamin D3 2000 UNITS capsule  Take 1 capsule (2,000 Units total) by mouth daily.        Allergies:  Allergies  Allergen Reactions  . Vancomycin     REACTION: ARF    Past Medical History  Diagnosis Date  . Retinopathy   . Neuropathy, lower extremity   . Hyperlipidemia   . GERD (gastroesophageal reflux disease)   . Erectile dysfunction   . Osteomyelitis of ankle and foot ([place])   . Hemorrhoids   . HEARING LOSS, SENSORINEURAL, BILATERAL 01/03/2007    Seen by ENT Dr. Orpah Greek D. Redmond Baseman 01/03/07  . Hypertension   . Asthma   . OSA on CPAP     Nocturnal polysomnogram on 01/21/2010 showed severe obstructive sleep apnea/hypopnea syndrome, AHI 74.1 per hour with non positional events, moderately loud snoring, and oxygen desaturation to a nadir of 78% on room air.  CPAP was successfully titrated to 17 CWP, AHI 1.1 per hour using a large [employer]Med Mirage Quattro full-face mask with heated humidifier. Bruxism was noted.   . Type II diabetes mellitus ([place])     w/complication NOS, type II  . Edema, macular, due to secondary diabetes ([place])   . DIABETIC FOOT ULCER 06/20/2009  . Arthritis     "elbows & knees" (12/13/2014)  . CKD (chronic kidney disease), stage III   . History of echocardiogram     a. 04/2008 Echo: EF 50-55%, abnl LV relaxation, mildly dil LA.  . Morbid obesity [facility])     Past Surgical History  Procedure Laterality Date  . Toe amputation Left 01/21/2006    S/P radical irrigation and debridement, left foot with third MTP joint  amputation by Dr. Kathalene Frames. Mayer Camel.  . Amputation Left 12/15/2014    Procedure: LEFT SECOND TOE AMPUTATION ;  Surgeon: Dorna Leitz, MD;  Location: [place];  Service: Orthopedics;  Laterality: Left;    Family History  Problem Relation Age of Onset  . Diabetes Mother     also 2 siblings  . Heart attack Father 33  . Throat cancer Brother     Social History:  reports that he quit smoking about 5 years ago. His smoking use included Cigarettes. He has a 15 pack-year smoking history. He has never used smokeless tobacco. He reports that he drinks about 6.6 oz of alcohol per week. He reports that he does not use illicit drugs.    Review of Systems    Lipid history: Appears to have had high triglycerides, on Lipitor alone    Lab Results  Component Value Date   CHOL 146 07/18/2014   HDL 32* 07/18/2014   LDLCALC 56 07/18/2014   TRIG 291* 07/18/2014   CHOLHDL 4.6 07/18/2014  Hypertension: Has had hypertension for a few years treated with enalapril,  taking 40 mg in the morning Also on Lasix and low-dose metoprolol  Last foot exam was in 10/16 Has long-standing numbness in his feet.  No significant pain or tingling     Physical Examination:  BP 118/66 mmHg  Pulse 75  Temp(Src) 98.2 F (36.8 C)  Resp 16  Ht 6' 6" (1.981 m)  Wt 395 lb 6.4 oz (179.352 kg)  BMI 45.70 kg/m2  SpO2 97%      ASSESSMENT:  Diabetes type 2, uncontrolled with morbid obesity   His blood sugars are somewhat better with starting Victoza and his weight has come down See history of present illness for detailed discussion of his current management, blood sugar patterns and problems identified However his compliance with diet is inconsistent and this is causing some fluctuation in blood sugars. Most of his fasting blood sugars are still high and they are variably high midday and in the evenings but tend to be lower before supper time and after lunch Dietary compliance can be better in the  evenings Currently still taking large amounts of insulin  HYPERTENSION: Appears variably controlled, blood pressure is relatively  lower knees having some orthostatic lightheadedness  RENAL dysfunction: This will need periodic follow-up  PLAN:     Continue 1.2 mg Victoza, consider increasing the dose on the next visit  Increase evening Lantus further and change to Toujeo when current supply runs out  Reduce lunchtime coverage and increase suppertime coverage with Humalog, details in patient instructions  If blood sugars are not consistently controlled will consider U-500 insulin  More consistent exercise  Consultation with dietitian  Discussed timing and targets of blood sugars  Reduce Vasotec dose, may take 20 mg the morning and 10 mg in the evening and follow-up with PCP or cardiologist   Patient Instructions  LANTUS insulin: Take 50 units in the morning and 100 units at bedtime   HUMALOG insulin: Take 15 units in the morning only when eating a fruit  Take 40 units before lunch and 45 units before supper.   Take 1 in am and 1/2 Enalapril in pm  Check blood sugars on waking up 3-4  times a week Also check blood sugars about 2 hours after a meal and do this after different meals by rotation  Recommended blood sugar levels on waking up is 90-130 and about 2 hours after meal is 130-160  Please bring your blood sugar monitor to each visit, thank you      Counseling time on subjects discussed above is over 50% of today's 25 minute visit   Diego Delancey 05/22/2015, 12:32 PM   Note: This office note was prepared with Estate agent. Any transcriptional errors that result from this process are unintentional.

## 2015-05-29 ENCOUNTER — Other Ambulatory Visit: Payer: Self-pay | Admitting: Internal Medicine

## 2015-05-29 DIAGNOSIS — E1142 Type 2 diabetes mellitus with diabetic polyneuropathy: Secondary | ICD-10-CM

## 2015-05-29 MED ORDER — HYDROCODONE-ACETAMINOPHEN 5-325 MG PO TABS: ORAL_TABLET | ORAL | Status: DC

## 2015-05-29 NOTE — Telephone Encounter (Signed)
Advised pt rx ready for pick up, LVM

## 2015-05-29 NOTE — Telephone Encounter (Signed)
Pt requesting hydrocodone to be filled.  °

## 2015-05-29 NOTE — Telephone Encounter (Signed)
Last refill 10/20 Last office visit 10/4 Last UDS 12/14/14 Future apt 11/22

## 2015-06-04 ENCOUNTER — Encounter: Payer: Self-pay | Admitting: Internal Medicine

## 2015-06-04 ENCOUNTER — Other Ambulatory Visit: Payer: Self-pay | Admitting: Podiatry

## 2015-06-04 ENCOUNTER — Ambulatory Visit (INDEPENDENT_AMBULATORY_CARE_PROVIDER_SITE_OTHER): Payer: Medicare Other | Admitting: Internal Medicine

## 2015-06-04 ENCOUNTER — Ambulatory Visit (HOSPITAL_COMMUNITY)
Admission: RE | Admit: 2015-06-04 | Discharge: 2015-06-04 | Disposition: A | Discharge status: Home / Self Care | Payer: Medicare Other | Source: Ambulatory Visit | Attending: Podiatry | Admitting: Podiatry

## 2015-06-04 VITALS — BP 127/71 | HR 75 | Temp 97.9°F | Ht 78.0 in | Wt 398.1 lb

## 2015-06-04 DIAGNOSIS — E785 Hyperlipidemia, unspecified: Secondary | ICD-10-CM | POA: Diagnosis not present

## 2015-06-04 DIAGNOSIS — I10 Essential (primary) hypertension: Secondary | ICD-10-CM

## 2015-06-04 DIAGNOSIS — E11621 Type 2 diabetes mellitus with foot ulcer: Secondary | ICD-10-CM | POA: Insufficient documentation

## 2015-06-04 DIAGNOSIS — L97521 Non-pressure chronic ulcer of other part of left foot limited to breakdown of skin: Secondary | ICD-10-CM

## 2015-06-04 DIAGNOSIS — IMO0002 Reserved for concepts with insufficient information to code with codable children: Secondary | ICD-10-CM

## 2015-06-04 DIAGNOSIS — N183 Chronic kidney disease, stage 3 (moderate): Secondary | ICD-10-CM | POA: Diagnosis not present

## 2015-06-04 DIAGNOSIS — I129 Hypertensive chronic kidney disease with stage 1 through stage 4 chronic kidney disease, or unspecified chronic kidney disease: Secondary | ICD-10-CM | POA: Diagnosis not present

## 2015-06-04 DIAGNOSIS — E1122 Type 2 diabetes mellitus with diabetic chronic kidney disease: Secondary | ICD-10-CM | POA: Diagnosis not present

## 2015-06-04 DIAGNOSIS — Z794 Long term (current) use of insulin: Secondary | ICD-10-CM

## 2015-06-04 DIAGNOSIS — E1142 Type 2 diabetes mellitus with diabetic polyneuropathy: Secondary | ICD-10-CM | POA: Diagnosis not present

## 2015-06-04 DIAGNOSIS — E1165 Type 2 diabetes mellitus with hyperglycemia: Secondary | ICD-10-CM

## 2015-06-04 LAB — GLUCOSE, CAPILLARY: Glucose-Capillary: 210 mg/dL — ABNORMAL HIGH (ref 65–99)

## 2015-06-04 LAB — POCT GLYCOSYLATED HEMOGLOBIN (HGB A1C): HEMOGLOBIN A1C: 8

## 2015-06-04 MED ORDER — OMEPRAZOLE 20 MG PO CPDR: 20.0000 mg | DELAYED_RELEASE_CAPSULE | Freq: Every day | ORAL | Status: DC

## 2015-06-04 NOTE — Assessment & Plan Note (Signed)
BP Readings from Last 3 Encounters:  06/04/15 127/71  05/22/15 115/70  05/22/15 118/66    Lab Results  Component Value Date   NA 137 05/17/2015   K 4.5 05/17/2015   CREATININE 1.88* 05/17/2015    Assessment: Blood pressure control:  well controlled Progress toward BP goal:   at goal Comments: Patient is compliant with meds  Plan: Medications:  continue current medications Educational resources provided:   Self management tools provided:   Other plans:

## 2015-06-04 NOTE — Assessment & Plan Note (Signed)
-   healing well now - patient to follow up with vascular sx and podiatry

## 2015-06-04 NOTE — Progress Notes (Signed)
   Subjective:    Patient ID: [name], male    DOB: 1951/01/20, 64 y.o.   MRN: [id number]  HPI Patient seen and examined. Patient presents for routine follow up of his diabetes and HTN. Patient states that he feels well and has been following with Dr. Dwyane Dee (endocrine) for his DM. He was recetly started on victoza and has had his lantus increased to 100 units at night. He will switch to toujeo once he runs out of lantus. Blood sugars have improved with current regimen and his A1C today is 8. His BP is well controlled and he is compliant with his meds. He has no other complaints today. He is scheduled to meet with vascular surgery today for left 5th toe ulcer. He was seen by podiatry for this and was referred to vascular. He states ulcer has healed well now. No fevers, no pain.    Review of Systems  Constitutional: Negative.   HENT: Negative.   Eyes: Negative.   Respiratory: Negative.   Cardiovascular: Negative.   Gastrointestinal: Negative.   Musculoskeletal: Negative.   Skin: Negative.   Neurological: Negative.   Psychiatric/Behavioral: Negative.        Objective:   Physical Exam  Constitutional: He is oriented to person, place, and time. He appears well-developed and well-nourished.  HENT:  Head: Normocephalic and atraumatic.  Cardiovascular: Normal rate, regular rhythm and normal heart sounds.   Pulmonary/Chest: Effort normal and breath sounds normal. No respiratory distress. He has no wheezes.  Abdominal: Soft. Bowel sounds are normal. He exhibits no distension. There is no tenderness.  Musculoskeletal: Normal range of motion. He exhibits edema.  Trace b/l LE edema  Neurological: He is alert and oriented to person, place, and time.  Skin: Skin is warm and dry.  Psychiatric: He has a normal mood and affect. His behavior is normal.          Assessment & Plan:  Please see problem based charting for assessment and plan:

## 2015-06-04 NOTE — Assessment & Plan Note (Signed)
Lab Results  Component Value Date   HGBA1C 8.0 06/04/2015   HGBA1C 8.7 03/12/2015   HGBA1C 8.4 12/13/2014     Assessment: Diabetes control:  fair Progress toward A1C goal: improved   Comments: patient is following with Dr. Dwyane Dee for his diabetes  Plan: Medications:  continue current medications Home glucose monitoring: Frequency:   Timing:   Instruction/counseling given: reminded to bring blood glucose meter & log to each visit, discussed foot care, discussed the need for weight loss and discussed diet Educational resources provided:   Self management tools provided:   Other plans:

## 2015-06-04 NOTE — Patient Instructions (Signed)
-   It was a pleasure seeing you today - Your blood sugars are improving. Please continue to follow up with Dr. Dwyane Dee - Your BP is well controlled. Continue with meds - The left toe ulcer is improving. Please f/u with podiatry and vascular surgery - Happy Holidays!

## 2015-06-05 ENCOUNTER — Encounter: Payer: Self-pay | Admitting: Podiatry

## 2015-06-05 ENCOUNTER — Ambulatory Visit (INDEPENDENT_AMBULATORY_CARE_PROVIDER_SITE_OTHER): Payer: Medicare Other | Admitting: Podiatry

## 2015-06-05 VITALS — BP 161/96 | HR 74 | Temp 97.1°F | Resp 12

## 2015-06-05 DIAGNOSIS — L89891 Pressure ulcer of other site, stage 1: Secondary | ICD-10-CM | POA: Diagnosis not present

## 2015-06-05 DIAGNOSIS — L97521 Non-pressure chronic ulcer of other part of left foot limited to breakdown of skin: Secondary | ICD-10-CM

## 2015-06-05 NOTE — Patient Instructions (Signed)
Apply a small amount of Silvadene on the skin ulcer fifth left toe, cover with gauze and attach with one-inch Coflex tape do not overtighten If you develop any sudden fever, swelling, redness, pain present to the emergency department  Diabetes and Foot Care Diabetes may cause you to have problems because of poor blood supply (circulation) to your feet and legs. This may cause the skin on your feet to become thinner, break easier, and heal more slowly. Your skin may become dry, and the skin may peel and crack. You may also have nerve damage in your legs and feet causing decreased feeling in them. You may not notice minor injuries to your feet that could lead to infections or more serious problems. Taking care of your feet is one of the most important things you can do for yourself.  HOME CARE INSTRUCTIONS  Wear shoes at all times, even in the house. Do not go barefoot. Bare feet are easily injured.  Check your feet daily for blisters, cuts, and redness. If you cannot see the bottom of your feet, use a mirror or ask someone for help.  Wash your feet with warm water (do not use hot water) and mild soap. Then pat your feet and the areas between your toes until they are completely dry. Do not soak your feet as this can dry your skin.  Apply a moisturizing lotion or petroleum jelly (that does not contain alcohol and is unscented) to the skin on your feet and to dry, brittle toenails. Do not apply lotion between your toes.  Trim your toenails straight across. Do not dig under them or around the cuticle. File the edges of your nails with an emery board or nail file.  Do not cut corns or calluses or try to remove them with medicine.  Wear clean socks or stockings every day. Make sure they are not too tight. Do not wear knee-high stockings since they may decrease blood flow to your legs.  Wear shoes that fit properly and have enough cushioning. To break in new shoes, wear them for just a few hours a day.  This prevents you from injuring your feet. Always look in your shoes before you put them on to be sure there are no objects inside.  Do not cross your legs. This may decrease the blood flow to your feet.  If you find a minor scrape, cut, or break in the skin on your feet, keep it and the skin around it clean and dry. These areas may be cleansed with mild soap and water. Do not cleanse the area with peroxide, alcohol, or iodine.  When you remove an adhesive bandage, be sure not to damage the skin around it.  If you have a wound, look at it several times a day to make sure it is healing.  Do not use heating pads or hot water bottles. They may burn your skin. If you have lost feeling in your feet or legs, you may not know it is happening until it is too late.  Make sure your health care provider performs a complete foot exam at least annually or more often if you have foot problems. Report any cuts, sores, or bruises to your health care provider immediately. SEEK MEDICAL CARE IF:   You have an injury that is not healing.  You have cuts or breaks in the skin.  You have an ingrown nail.  You notice redness on your legs or feet.  You feel burning or tingling  in your legs or feet.  You have pain or cramps in your legs and feet.  Your legs or feet are numb.  Your feet always feel cold. SEEK IMMEDIATE MEDICAL CARE IF:   There is increasing redness, swelling, or pain in or around a wound.  There is a red line that goes up your leg.  Pus is coming from a wound.  You develop a fever or as directed by your health care provider.  You notice a bad smell coming from an ulcer or wound.   This information is not intended to replace advice given to you by your health care provider. Make sure you discuss any questions you have with your health care provider.   Document Released: 06/26/2000 Document Revised: 03/01/2013 Document Reviewed: 12/06/2012 Elsevier Interactive Patient Education [employer]

## 2015-06-06 NOTE — Progress Notes (Signed)
Patient ID: [name], male   DOB: [birth year], 64 y.o.   MRN: [id number]  Subjective: Patient presents today for follow-up care for ulceration fifth toe left foot with initial presentation on 05/01/2015. At initial presentation patient had 2 week history of this lesion. The area was initially debrided and Silvadene dressing applied to the area. A surgical shoe was dispensed Patient was referred for vascular lab on the initial visit with the results pending.. Patient has completed Doppler vascular examination on 06/04/2015 patient admits that he is not using gauze dressing on the fifth left toe, rather applying Silvadene and a Band-Aid  Objective: Orientated 3 DP pulse right 0/4 and DP pulse left 1/4 PT pulse right 1/4 and PT pulse left 0/4 Capillary reflex within 3 seconds Fifth left toe is mildly edematous with a macerated superficial ulcer after debridement measures 5 x 12 mm. There is slight serous drainage around this area. The ulcer on the fifth left toe does not probe deep to bone. The base of the ulcers granular. Neurological: Sensation to 10 g monofilament wire intact 0/5 bilaterally Amputated second and third left toes  Results of lower arterial examination dated 06/04/2015: No evidence of segmental lower extremity arterial disease at rest, bilaterally Normal ABIs bilaterally Normal great toe brachial index bilaterally Normal toe PPG in all toes present  Assessment: Satisfactory vascular status for opportunity to healing ulcer site Enlarging fifth toe left ulceration that clinically does not appear to be infected. Possible maceration and irritation of wound as patient is not correctly bandaging toe and causing maceration with Band-Aid Diabetic peripheral neuropathy  Plan: Debridement of scaling skin and ulcer with evidence of bleeding after debridement. Apply Silvadene and gauze dressing. Patient is instructed to apply Silvadene and gauze dressing attached lightly with  Coflex tape. Continue wearing surgical shoe on and off foot. He is made aware that the wound has increased in size probably from incorrect bandaging, however, the wound has enlarged. I advised that if he develops any sudden increase in pain, swelling, warmth, drainage, present to emergency department

## 2015-06-11 ENCOUNTER — Telehealth: Payer: Self-pay | Admitting: *Deleted

## 2015-06-11 NOTE — Telephone Encounter (Addendum)
-----   Message from Gean Birchwood, DPM sent at 06/11/2015  7:47 AM EST ----- Or arterial Doppler examination dated 06/04/2015  No evidence of segmental lower extremity arterial disease at rest Normal ABIs bilaterally Normal great toe brachial index bilaterally  Contact patient advised him that no follow-up is needed based on this arterial Doppler. Left results message for pt.

## 2015-06-12 ENCOUNTER — Ambulatory Visit: Payer: Medicare Other | Admitting: Podiatry

## 2015-06-19 ENCOUNTER — Other Ambulatory Visit: Payer: Self-pay | Admitting: Internal Medicine

## 2015-06-21 ENCOUNTER — Encounter: Payer: Self-pay | Admitting: Dietician

## 2015-06-21 ENCOUNTER — Encounter: Payer: Medicare Other | Attending: Endocrinology | Admitting: Dietician

## 2015-06-21 ENCOUNTER — Other Ambulatory Visit: Payer: Self-pay | Admitting: Internal Medicine

## 2015-06-21 VITALS — Ht 78.0 in | Wt 393.0 lb

## 2015-06-21 DIAGNOSIS — E1165 Type 2 diabetes mellitus with hyperglycemia: Secondary | ICD-10-CM

## 2015-06-21 DIAGNOSIS — E119 Type 2 diabetes mellitus without complications: Secondary | ICD-10-CM | POA: Diagnosis not present

## 2015-06-21 DIAGNOSIS — E669 Obesity, unspecified: Secondary | ICD-10-CM

## 2015-06-21 DIAGNOSIS — N183 Chronic kidney disease, stage 3 (moderate): Secondary | ICD-10-CM

## 2015-06-21 DIAGNOSIS — IMO0002 Reserved for concepts with insufficient information to code with codable children: Secondary | ICD-10-CM

## 2015-06-21 DIAGNOSIS — Z794 Long term (current) use of insulin: Secondary | ICD-10-CM

## 2015-06-21 DIAGNOSIS — E1122 Type 2 diabetes mellitus with diabetic chronic kidney disease: Secondary | ICD-10-CM

## 2015-06-21 NOTE — Patient Instructions (Signed)
Be more consistent with your exercise. Aim for 30 minutes most days of the week.  Walk or go to the [facility]. Continue drinking the low sodium V-8 juice. Avoid the Grapefruit juice as this interferes with your medication.  Avoid orange juice unless blood sugar is low. Bake rather than fry most often. Aim for 60 grams carbohydrate at each meal. Watch your portion sizes.  Eat slowly.  Stop when you are full.  Thank you for checking your blood sugar so often.  Bring your meter to your doctors appointments. When Lantus runs out, begin Toujeo 74 units 2 times per day per MD. Continue to take other medication as ordered.

## 2015-06-21 NOTE — Progress Notes (Signed)
Diabetes Self-Management Education  Visit Type: First/Initial  Appt. Start Time: 1445 Appt. End Time: [id number]  06/21/2015  [name], identified by name and date of birth, is a 64 y.o. male with a diagnosis of Diabetes: Type 2. Other hx includes CKD, HTN, OSA- uses C-pap, hyperlipidemia, GERD.  He has not started the [facility] as he still has the lantus.  He stated he did not know the dose of the Toujeo.  Dose provided based on med report.  Patient lives with his wife.  They are having problems and this brings him some stress.  He does most of the shopping and the cooking.  He relies on transportation from his [employer].  He states that he has begun walking.    ASSESSMENT  Height 6\' 6"  (1.981 m), weight 393 lb (178.264 kg). Body mass index is 45.42 kg/(m^2).      Diabetes Self-Management Education - 06/21/15 1625    Visit Information   Visit Type First/Initial   Initial Visit   Diabetes Type Type 2   Are you currently following a meal plan? No   Are you taking your medications as prescribed? Yes  He has not het switched from lantus to [facility] as he still has lantus left.   Date Diagnosed 2007   Health Coping   How would you rate your overall health? Good   Psychosocial Assessment   Patient Belief/Attitude about Diabetes Motivated to manage diabetes   Self-care barriers Lack of transportation;Lack of material resources   Self-management support Doctor's office;Family   Other persons present Patient   Patient Concerns Nutrition/Meal planning;Glycemic Control;Weight Control   Special Needs Simplified materials   Preferred Learning Style No preference indicated   Learning Readiness Ready   How often do you need to have someone help you when you read instructions, pamphlets, or other written materials from your doctor or pharmacy? 3 - Sometimes  neice helps him   What is the last grade level you completed in school? 8th grade   Complications   Last HgB A1C per  patient/outside source 8 %  06/04/15   How often do you check your blood sugar? > 4 times/day   Fasting Blood glucose range (mg/dL) 70-129   Postprandial Blood glucose range (mg/dL) 70-129;130-179;180-200  occasional 300 HS due to increased snacking.   Number of hypoglycemic episodes per month 0   Number of hyperglycemic episodes per week 7   Are you checking your feet? Yes   Dietary Intake   Breakfast banana and 1% milk   Snack (morning) none   Lunch ham, eggs, 1-1 1/2 cups grits, occasional toast and/or cheese   Dinner oxtails/neckbones, or chicken or por chops (baked) or occasional fried fish, veges, pintos, instant potatoes   Snack (evening) sugar free ice cream sandwich, popcorn, cookies   Beverage(s) water, LS tomato v-8, coffee with sweet and low, juice usually only when blood sugar is low, diet soda   Exercise   Exercise Type Light (walking / raking leaves)   How many days per week to you exercise? 3   How many minutes per day do you exercise? 30   Total minutes per week of exercise 90   Patient Education   Disease state  Explored patient's options for treatment of their diabetes   Nutrition management  Food label reading, portion sizes and measuring food.;Meal options for control of blood glucose level and chronic complications.;Role of diet in the treatment of diabetes and the relationship between the three main  macronutrients and blood glucose level   Physical activity and exercise  Role of exercise on diabetes management, blood pressure control and cardiac health.   Psychosocial adjustment Worked with patient to identify barriers to care and solutions   Individualized Goals (developed by patient)   Nutrition General guidelines for healthy choices and portions discussed   Physical Activity Exercise 5-7 days per week;30 minutes per day   Medications take my medication as prescribed   Monitoring  test my blood glucose as discussed   Health Coping ask for help with (comment)   ask MD, RD, or neice as needed to help you understand best care   Outcomes   Expected Outcomes Demonstrated interest in learning. Expect positive outcomes   Future DMSE 4-6 wks   Program Status Completed      Individualized Plan for Diabetes Self-Management Training:   Learning Objective:  Patient will have a greater understanding of diabetes self-management. Patient education plan is to attend individual and/or group sessions per assessed needs and concerns.   Plan:   Patient Instructions  Be more consistent with your exercise. Aim for 30 minutes most days of the week.  Walk or go to the [facility]. Continue drinking the low sodium V-8 juice. Avoid the Grapefruit juice as this interferes with your medication.  Avoid orange juice unless blood sugar is low. Bake rather than fry most often. Aim for 60 grams carbohydrate at each meal. Watch your portion sizes.  Eat slowly.  Stop when you are full.  Thank you for checking your blood sugar so often.  Bring your meter to your doctors appointments. When Lantus runs out, begin Toujeo 74 units 2 times per day per MD. Continue to take other medication as ordered.   Expected Outcomes:  Demonstrated interest in learning. Expect positive outcomes  Education material provided: Meal plan card, My Plate and Snack sheet, food bank handout  If problems or questions, patient to contact team via:  Phone  Future DSME appointment: 4-6 wks

## 2015-06-24 ENCOUNTER — Telehealth: Payer: Self-pay | Admitting: Internal Medicine

## 2015-06-24 ENCOUNTER — Telehealth: Payer: Self-pay | Admitting: Endocrinology

## 2015-06-24 NOTE — Telephone Encounter (Signed)
Pt requesting Eliquis to be filled.

## 2015-06-24 NOTE — Telephone Encounter (Signed)
It was done today.

## 2015-06-24 NOTE — Telephone Encounter (Signed)
HAs appt in March

## 2015-06-24 NOTE — Telephone Encounter (Signed)
Patient called stating that he would like to speak with Suanne Marker regarding his new Rx  Rx: Toujeo   Thank you

## 2015-06-26 NOTE — Telephone Encounter (Signed)
Patient Name: [name] Gender: Male DOB: 06-18-1951 Age: 64 Y 32 M 21 D Return Phone Number: [phone] (Primary) Address: City/State/Zip: [place] Client [facility] Night - Client Client Site [facility] Contact Type Call Call Type Triage / Clinical Caller Name Evian Relationship To Patient Self Return Phone Number [phone] (Primary) Chief Complaint Medication Question (non symptomatic) Initial Comment Caller states: I need to speak with doctor about my insulin. Nurse Assessment Nurse: Jimmye Norman, RN, Whitney Date/Time (Eastern Time): 06/25/2015 5:40:20 PM Confirm and document reason for call. If symptomatic, describe symptoms. ---caller states he was taking lantus 50 units morning 100 at night, was told that when he ran out of his lantus to start taking toujeo 75 units twice daily.is wondering if he needs to take the toujeo at night with his victoza, just like he did with his lantus. Will finish his lantus tonight and start with the toujeo tomorrow. Has the patient traveled out of the country within the last 30 days? ---Not Applicable Does the patient have any new or worsening symptoms? ---No Please document clinical information provided and list any resource used. ---RN instructed that the toujeo should be the same as the lantus, but just call the office first thing in the morning and make sure that he is supposed to take it the same way, since he is unsure. Caller verbalized understanding Guidelines Guideline Title Affirmed Question Affirmed Notes Nurse Date

## 2015-06-28 ENCOUNTER — Other Ambulatory Visit: Payer: Self-pay | Admitting: Internal Medicine

## 2015-06-28 ENCOUNTER — Other Ambulatory Visit (INDEPENDENT_AMBULATORY_CARE_PROVIDER_SITE_OTHER): Payer: Medicare Other

## 2015-06-28 DIAGNOSIS — Z794 Long term (current) use of insulin: Secondary | ICD-10-CM

## 2015-06-28 DIAGNOSIS — E1165 Type 2 diabetes mellitus with hyperglycemia: Secondary | ICD-10-CM

## 2015-06-28 DIAGNOSIS — E1142 Type 2 diabetes mellitus with diabetic polyneuropathy: Secondary | ICD-10-CM

## 2015-06-28 LAB — BASIC METABOLIC PANEL
BUN: 17 mg/dL (ref 6–23)
CALCIUM: 9.7 mg/dL (ref 8.4–10.5)
CO2: 31 mEq/L (ref 19–32)
Chloride: 103 mEq/L (ref 96–112)
Creatinine, Ser: 1.59 mg/dL — ABNORMAL HIGH (ref 0.40–1.50)
GFR: 56.42 mL/min — AB (ref >60.00)
Glucose, Bld: 142 mg/dL — ABNORMAL HIGH (ref 70–99)
POTASSIUM: 4 meq/L (ref 3.5–5.1)
SODIUM: 139 meq/L (ref 135–145)

## 2015-06-28 MED ORDER — HYDROCODONE-ACETAMINOPHEN 5-325 MG PO TABS: ORAL_TABLET | ORAL | Status: DC

## 2015-06-28 NOTE — Telephone Encounter (Signed)
Use your judgement whether to give all 3 or get uDS

## 2015-06-28 NOTE — Telephone Encounter (Signed)
Called both #'s, first lady hung up on me, second left message

## 2015-06-28 NOTE — Telephone Encounter (Signed)
Pt requesting hydrocodone to be filled.  °

## 2015-06-28 NOTE — Telephone Encounter (Signed)
Last filled by dr Dareen Piano 11/16 Next visit 09/2015 No recent uds

## 2015-06-28 NOTE — Telephone Encounter (Signed)
Informed, will pick up mon

## 2015-06-29 LAB — FRUCTOSAMINE: FRUCTOSAMINE: 238 umol/L (ref 0–285)

## 2015-07-02 ENCOUNTER — Other Ambulatory Visit: Payer: Medicare Other

## 2015-07-02 DIAGNOSIS — Z79891 Long term (current) use of opiate analgesic: Secondary | ICD-10-CM | POA: Diagnosis not present

## 2015-07-02 DIAGNOSIS — E1142 Type 2 diabetes mellitus with diabetic polyneuropathy: Secondary | ICD-10-CM | POA: Diagnosis not present

## 2015-07-03 ENCOUNTER — Ambulatory Visit (INDEPENDENT_AMBULATORY_CARE_PROVIDER_SITE_OTHER): Payer: Medicare Other | Admitting: Endocrinology

## 2015-07-03 ENCOUNTER — Other Ambulatory Visit: Payer: Self-pay | Admitting: *Deleted

## 2015-07-03 VITALS — BP 158/80 | HR 85 | Temp 98.2°F | Resp 16 | Ht 78.0 in | Wt 391.6 lb

## 2015-07-03 DIAGNOSIS — Z794 Long term (current) use of insulin: Secondary | ICD-10-CM | POA: Diagnosis not present

## 2015-07-03 DIAGNOSIS — I10 Essential (primary) hypertension: Secondary | ICD-10-CM | POA: Diagnosis not present

## 2015-07-03 DIAGNOSIS — E1165 Type 2 diabetes mellitus with hyperglycemia: Secondary | ICD-10-CM | POA: Diagnosis not present

## 2015-07-03 MED ORDER — VICTOZA 18 MG/3ML ~~LOC~~ SOPN: PEN_INJECTOR | SUBCUTANEOUS | Status: DC

## 2015-07-03 NOTE — Patient Instructions (Addendum)
TOUJEO insulin: Take 60 units in the morning and 90 units at bedtime   HUMALOG insulin: Take 15 units in the morning only when eating a fruit  Take 40 units before lunch and 50 units before supper.   Call before refilling Humalog  Victoza 1.8mg  daily  Check blood sugars on waking up   times a week Also check blood sugars about 2 hours after a meal and do this after different meals by rotation  Recommended blood sugar levels on waking up is 90-130 and about 2 hours after meal is 130-160  Please bring your blood sugar monitor to each visit, thank you

## 2015-07-03 NOTE — Progress Notes (Signed)
Patient ID: [name], male   DOB: February 23, 1951, 64 y.o.   MRN: [id number]           Reason for Appointment: Follow-up for Type 2 Diabetes  Referring physician: NARENDRA  History of Present Illness:          Date of diagnosis of type 2 diabetes mellitus: 2007       Background history:   He was diagnosed to have diabetes when he had an ulcer on his left third toe which led to amputation.  His glucose was apparently about 400 and he was started on insulin at that time He does not know if he has taken any oral hypoglycemic drugs in the past His A1c in 2016 has been consistently over 8%  Recent history:   INSULIN regimen is:  TOUJEO 74 units twice a day HUMALOG insulin: Take 15 units in the morning only when eating a fruit  Take 40 units before lunch and 45 units before supper.   On his initial consultation was started on Victoza and his insulin doses were adjusted with lower basal insulin in the morning and higher in the evening He has tolerated the Victoza very well and taking 1. 2 mg consistently  With this he has lost weight again  Current blood sugar patterns and problems identified:  FASTING blood sugars are mostly high although has a couple of readings near normal    He has switched to [place] only a few days ago and not clear if his blood sugars are any different with this compared to Lantus  Blood sugars are variable during the day and usually not high midday or suppertime except on occasion  Blood sugars are being checked periodically after supper and these are  Mostly high and this is despite his being compliant with taking the mealtime insulin before supper. Sometimes will have more snacks after supper and highest sugar was after eating ice cream   has seen the dietitian and is going to start making some changes  No hypoglycemia with only occasional readings near 100  He has been walking intermittently, only when he gets time.  However rarely when he is more  active he may feel little shaky  Non-insulin hypoglycemic drugs the patient is taking are: None      Side effects from medications have been: None  Compliance with the medical regimen: Fair  Hypoglycemia:   occasionally overnight as above  Glucose monitoring:  done up to 3  times a day         Glucometer:  Accu-Chek     Blood Glucose readings by  Download as follows  Mean values apply above for all meters except median for One Touch  PRE-MEAL Fasting  early afternoon Dinner Bedtime Overall  Glucose range:  104-241 116- 240  109-411  172-508   Mean/median:  158  176   152  265  184+/-80    Self-care: The diet that the patient has been following is: None    Typical meal intake: Breakfast is sometimes fruit like a banana, usually eating eggs, ham and grits at lunch.   Has snacks with cookies, popcorn, less sweets recently Dinner 6 pm He drinks grapefruit or V-8 juice and diet drinks                Dietician consultation: 12/16               Exercise: walking up to 30 minutes, 3x/week   Weight history: Previous  range 260-410  Wt Readings from Last 3 Encounters:  07/03/15 391 lb 9.6 oz (177.629 kg)  06/21/15 393 lb (178.264 kg)  06/04/15 398 lb 1.6 oz (180.577 kg)    Glycemic control:   Lab Results  Component Value Date   HGBA1C 8.0 06/04/2015   HGBA1C 8.7 03/12/2015   HGBA1C 8.4 12/13/2014   Lab Results  Component Value Date   MICROALBUR 89.3* 05/17/2015   LDLCALC 56 07/18/2014   CREATININE 1.59* 06/28/2015         Medication List       This list is accurate as of: 07/03/15 10:43 AM.  Always use your most recent med list.               ACCU-CHEK AVIVA PLUS W/DEVICE Kit  1 each by Does not apply route 4 (four) times daily -  with meals and at bedtime.     ACCU-CHEK FASTCLIX LANCETS Misc  Check blood sugar 4 times a day before meals and bedtime dx code 250.02 insulin requiring     acetaminophen 500 MG tablet  Commonly known as:  TYLENOL  Take 1 tablet  (500 mg total) by mouth every 4 (four) hours as needed. Maximum Daily Dosage of 2000 mg     albuterol 108 (90 BASE) MCG/ACT inhaler  Commonly known as:  PROAIR HFA  Inhale 2 puffs into the lungs every 6 (six) hours as needed for wheezing or shortness of breath.     atorvastatin 20 MG tablet  Commonly known as:  LIPITOR  Take 1 tablet (20 mg total) by mouth daily.     BESIVANCE 0.6 % Susp  Generic drug:  Besifloxacin HCl  Place 1 drop into both eyes 4 (four) times daily.     capsaicin 0.025 % cream  Commonly known as:  ZOSTRIX  Apply 1 application topically daily as needed (for pain).     docusate sodium 100 MG capsule  Commonly known as:  COLACE  Take 1 capsule (100 mg total) by mouth 2 (two) times daily.     ELIQUIS 5 MG Tabs tablet  Generic drug:  apixaban  TAKE 1 TABLET(5 MG) BY MOUTH TWICE DAILY     enalapril 20 MG tablet  Commonly known as:  VASOTEC  Take 2 tablets (40 mg total) by mouth daily.     furosemide 40 MG tablet  Commonly known as:  LASIX  Take 1 tablet (40 mg total) by mouth 2 (two) times daily.     glucose blood test strip  Commonly known as:  ACCU-CHEK AVIVA PLUS  USE TO CHECK BLOOD SUGAR FOUR TIMES DAILY BEFORE MEALS AND AT BEDTIME     HUMALOG KWIKPEN 100 UNIT/ML KiwkPen  Generic drug:  insulin lispro  SEE NOTES     HYDROcodone-acetaminophen 5-325 MG tablet  Commonly known as:  NORCO/VICODIN  Take 1 and 1/2 tablets by mouth every 6 hours as needed for pain     insulin glargine 100 UNIT/ML injection  Commonly known as:  LANTUS  Inject 50 Units into the skin 2 (two) times daily. Reported on 07/03/2015     Insulin Glargine 300 UNIT/ML Sopn  Commonly known as:  TOUJEO SOLOSTAR  Inject 74 Units into the skin 2 (two) times daily.     Insulin Pen Needle 31G X 5 MM Misc  Use for insulin injection 5 times a day.     LUMIGAN 0.01 % Soln  Generic drug:  bimatoprost  Place 1 drop into both eyes at  bedtime.     metoprolol succinate 25 MG 24 hr tablet   Commonly known as:  TOPROL-XL  Take 1 tablet (25 mg total) by mouth daily.     metoprolol tartrate 25 MG tablet  Commonly known as:  LOPRESSOR     omeprazole 20 MG capsule  Commonly known as:  PRILOSEC  Take 1 capsule (20 mg total) by mouth daily.     promethazine 25 MG tablet  Commonly known as:  PHENERGAN  TAKE 1/2 TO 1 TABLET BY MOUTH EVERY 6 HOURS AS NEEDED FOR NAUSEA     silver sulfADIAZINE 1 % cream  Commonly known as:  SILVADENE  Apply 1 application topically daily.     SIMBRINZA 1-0.2 % Susp  Generic drug:  Brinzolamide-Brimonidine  Place 1 drop into both eyes 2 (two) times daily. Take as instructed by Dr. Dione Booze.     VICTOZA 18 MG/3ML Sopn  Generic drug:  Liraglutide  Inject 1.8 mg once daily at the same time     Vitamin D3 2000 UNITS capsule  Take 1 capsule (2,000 Units total) by mouth daily.        Allergies:  Allergies  Allergen Reactions  . Vancomycin     REACTION: ARF    Past Medical History  Diagnosis Date  . Retinopathy   . Neuropathy, lower extremity   . Hyperlipidemia   . GERD (gastroesophageal reflux disease)   . Erectile dysfunction   . Osteomyelitis of ankle and foot (HCC)   . Hemorrhoids   . HEARING LOSS, SENSORINEURAL, BILATERAL 01/03/2007    Seen by ENT Dr. Karren Burly D. Jenne Pane 01/03/07  . Hypertension   . Asthma   . OSA on CPAP     Nocturnal polysomnogram on 01/21/2010 showed severe obstructive sleep apnea/hypopnea syndrome, AHI 74.1 per hour with non positional events, moderately loud snoring, and oxygen desaturation to a nadir of 78% on room air.  CPAP was successfully titrated to 17 CWP, AHI 1.1 per hour using a large ResMed Mirage Quattro full-face mask with heated humidifier. Bruxism was noted.   . Type II diabetes mellitus (HCC)     w/complication NOS, type II  . Edema, macular, due to secondary diabetes (HCC)   . DIABETIC FOOT ULCER 06/20/2009  . Arthritis     "elbows & knees" (12/13/2014)  . CKD (chronic kidney disease), stage III     . History of echocardiogram     a. 04/2008 Echo: EF 50-55%, abnl LV relaxation, mildly dil LA.  . Morbid obesity [facility])     Past Surgical History  Procedure Laterality Date  . Toe amputation Left 01/21/2006    S/P radical irrigation and debridement, left foot with third MTP joint amputation by Dr. [name]. [name].  . Amputation Left 12/15/2014    Procedure: LEFT SECOND TOE AMPUTATION ;  Surgeon: Jodi Geralds, MD;  Location: [facility] OR;  Service: Orthopedics;  Laterality: Left;    Family History  Problem Relation Age of Onset  . Diabetes Mother     also 2 siblings  . Heart attack Father 56  . Throat cancer Brother     Social History:  reports that he quit smoking about 5 years ago. His smoking use included Cigarettes. He has a 15 pack-year smoking history. He has never used smokeless tobacco. He reports that he drinks about 6.6 oz of alcohol per week. He reports that he does not use illicit drugs.    Review of Systems    Lipid history: Appears to have  had high triglycerides, on Lipitor alone    Lab Results  Component Value Date   CHOL 146 07/18/2014   HDL 32* 07/18/2014   LDLCALC 56 07/18/2014   TRIG 291* 07/18/2014   CHOLHDL 4.6 07/18/2014            Hypertension: Has had hypertension for a few years treated with enalapril,  taking 40 mg in the morning Also on Lasix and low-dose metoprolol  Last foot exam was in 10/16 Has long-standing numbness in his feet.  No significant pain or tingling     Physical Examination:  BP 158/80 mmHg  Pulse 85  Temp(Src) 98.2 F (36.8 C) (Oral)  Resp 16  Ht '6\' 6"'$  (1.981 m)  Wt 391 lb 9.6 oz (177.629 kg)  BMI 45.26 kg/m2  SpO2 97%      ASSESSMENT:  Diabetes type 2, uncontrolled with morbid obesity   His blood sugars are still overall high and very labile even with continuing Victoza and basal bolus insulin.  See history of present illness for detailed discussion of his current management, blood sugar patterns and problems  identified  He has only recently started watching his diet a little better but not enough , has seen the dietitian.   Has also recently just switched to Toujeo and using the same dose twice a day for now He appears to need relatively larger doses of insulin to cover his evening meal and snacks. May benefit from increasing the Victoza dose  Currently still taking large amounts of insulin and should benefit from switching Humalog to U-500 insulin but will wait until he finishes his current supply  HYPERTENSION: Appears variably controlled, has not taken his medication this morning and will discuss with PCP   RENAL dysfunction: slightly better, continue follow-up with PCP  HYPERLIPIDEMIA: Will need fasting labs for high triglycerides, this may improve with continued weight loss and improved diabetes control  PLAN:     Continue Toujeo but change the dose to 60 in the morning and 98 and evening.  Start taking1.5 mg Victoza  More consistent diet especially snacks in the evening  Additional 5 units of Humalog at suppertime  He will call us when his Humalog supply is running out to switch to U-500 insulin, probably starting with the same dose  Follow instructions given by dietitian  Low-fat diet and avoid high carbohydrate snacks  More readings 2 hours after lunch   Patient Instructions  TOUJEO insulin: Take 60 units in the morning and 90 units at bedtime   HUMALOG insulin: Take 15 units in the morning only when eating a fruit  Take 40 units before lunch and 50 units before supper.   Call before refilling Humalog  Victoza 1.'8mg'$  daily  Check blood sugars on waking up   times a week Also check blood sugars about 2 hours after a meal and do this after different meals by rotation  Recommended blood sugar levels on waking up is 90-130 and about 2 hours after meal is 130-160  Please bring your blood sugar monitor to each visit, thank you        [facility] 07/03/2015, 10:43  AM   Note: This office note was prepared with Dragon voice recognition system technology. Any transcriptional errors that result from this process are unintentional.

## 2015-07-11 LAB — TOXASSURE SELECT,+ANTIDEPR,UR: PDF: 0

## 2015-07-18 ENCOUNTER — Other Ambulatory Visit: Payer: Self-pay | Admitting: Internal Medicine

## 2015-07-26 ENCOUNTER — Ambulatory Visit: Payer: Medicare Other | Admitting: Dietician

## 2015-08-09 ENCOUNTER — Other Ambulatory Visit (INDEPENDENT_AMBULATORY_CARE_PROVIDER_SITE_OTHER): Payer: Medicare Other

## 2015-08-09 DIAGNOSIS — Z794 Long term (current) use of insulin: Secondary | ICD-10-CM

## 2015-08-09 DIAGNOSIS — E1165 Type 2 diabetes mellitus with hyperglycemia: Secondary | ICD-10-CM | POA: Diagnosis not present

## 2015-08-09 LAB — COMPREHENSIVE METABOLIC PANEL
ALBUMIN: 3.7 g/dL (ref 3.5–5.2)
ALK PHOS: 128 U/L — AB (ref 39–117)
ALT: 19 U/L (ref 0–53)
AST: 17 U/L (ref 0–37)
BILIRUBIN TOTAL: 0.5 mg/dL (ref 0.2–1.2)
BUN: 18 mg/dL (ref 6–23)
CALCIUM: 9.1 mg/dL (ref 8.4–10.5)
CO2: 29 meq/L (ref 19–32)
CREATININE: 1.59 mg/dL — AB (ref 0.40–1.50)
Chloride: 102 mEq/L (ref 96–112)
GFR: 56.4 mL/min — AB (ref >60.00)
Glucose, Bld: 170 mg/dL — ABNORMAL HIGH (ref 70–99)
Potassium: 4.1 mEq/L (ref 3.5–5.1)
Sodium: 137 mEq/L (ref 135–145)
Total Protein: 6.9 g/dL (ref 6.0–8.3)

## 2015-08-09 LAB — LIPID PANEL
CHOL/HDL RATIO: 4
CHOLESTEROL: 110 mg/dL (ref 0–200)
HDL: 27.4 mg/dL — AB (ref >39.00)
LDL Cholesterol: 53 mg/dL (ref 0–99)
NonHDL: 83.01
TRIGLYCERIDES: 149 mg/dL (ref 0.0–149.0)
VLDL: 29.8 mg/dL (ref 0.0–40.0)

## 2015-08-09 LAB — HEMOGLOBIN A1C: Hgb A1c MFr Bld: 8.5 % — ABNORMAL HIGH (ref 4.6–6.5)

## 2015-08-14 ENCOUNTER — Encounter: Payer: Self-pay | Admitting: Licensed Clinical Social Worker

## 2015-08-14 ENCOUNTER — Other Ambulatory Visit: Payer: Self-pay | Admitting: *Deleted

## 2015-08-14 ENCOUNTER — Encounter: Payer: Self-pay | Admitting: Endocrinology

## 2015-08-14 ENCOUNTER — Ambulatory Visit (INDEPENDENT_AMBULATORY_CARE_PROVIDER_SITE_OTHER): Payer: Medicare Other | Admitting: Endocrinology

## 2015-08-14 VITALS — BP 138/76 | HR 78 | Temp 98.3°F | Resp 16 | Ht 78.0 in | Wt 394.2 lb

## 2015-08-14 DIAGNOSIS — E0865 Diabetes mellitus due to underlying condition with hyperglycemia: Secondary | ICD-10-CM | POA: Diagnosis not present

## 2015-08-14 DIAGNOSIS — IMO0002 Reserved for concepts with insufficient information to code with codable children: Secondary | ICD-10-CM

## 2015-08-14 DIAGNOSIS — E0821 Diabetes mellitus due to underlying condition with diabetic nephropathy: Secondary | ICD-10-CM | POA: Diagnosis not present

## 2015-08-14 DIAGNOSIS — Z794 Long term (current) use of insulin: Secondary | ICD-10-CM

## 2015-08-14 MED ORDER — INSULIN DEGLUDEC 200 UNIT/ML ~~LOC~~ SOPN: 150.0000 [IU] | PEN_INJECTOR | Freq: Every day | SUBCUTANEOUS | Status: DC

## 2015-08-14 NOTE — Progress Notes (Signed)
Patient ID: [name], male   DOB: [birth year], 65 y.o.   MRN: [id number]           Reason for Appointment: Follow-up for Type 2 Diabetes  Referring physician: NARENDRA  History of Present Illness:          Date of diagnosis of type 2 diabetes mellitus: 2007       Background history:   He was diagnosed to have diabetes when he had an ulcer on his left third toe which led to amputation.  His glucose was apparently about 400 and he was started on insulin at that time He does not know if he has taken any oral hypoglycemic drugs in the past His A1c in 2016 has been consistently over 8%  Recent history:   INSULIN regimen is:  TOUJEO  60 in the morning and 90 in the evening HUMALOG insulin: Take 15 units in the morning only when eating a fruit ,40 units before lunch and 45 units before supper.   On his initial consultation was started on Victoza and his insulin doses were adjusted with lower basal insulin in the morning and higher in the evening A1c has been done recently and is still not improved  Current blood sugar patterns and problems identified:  FASTING blood sugars are still not better, recently only one normal reading  He has not lost any more weight  Blood sugars are relatively lower in the afternoon and before supper time  Occasionally has low blood sugars possibly with taking extra insulin for a high reading in the afternoon  Blood sugars after supper are variable but occasionally over 250, he thinks this is from eating sweets  He has been walking intermittently, only when he gets time.  However rarely when he is more active he may feel little shaky  Non-insulin hypoglycemic drugs the patient is taking are: None      Side effects from medications have been: None  Compliance with the medical regimen: Fair  Hypoglycemia:   occasionally overnight as above  Glucose monitoring:  done up to 3  times a day         Glucometer:  Accu-Chek     Blood Glucose readings  by  Download as follows  Mean values apply above for all meters except median for One Touch  PRE-MEAL Fasting Lunch Dinner Bedtime Overall  Glucose range: 98-209 98-427 60-303 117-314   Mean/median: 171    175+/-77    Self-care: The diet that the patient has been following is: None    Typical meal intake: Breakfast is sometimes fruit like a banana, usually eating eggs, ham and grits at lunch.   Has snacks with cookies, popcorn, less sweets recently Dinner 6 pm He drinks grapefruit or V-8 juice and diet drinks                Dietician consultation: 12/16               Exercise: walking up to 30 minutes, 3x/week   Weight history: Previous range 260-410  Wt Readings from Last 3 Encounters:  08/14/15 394 lb 3.2 oz (178.808 kg)  07/03/15 391 lb 9.6 oz (177.629 kg)  06/21/15 393 lb (178.264 kg)    Glycemic control:   Lab Results  Component Value Date   HGBA1C 8.5* 08/09/2015   HGBA1C 8.0 06/04/2015   HGBA1C 8.7 03/12/2015   Lab Results  Component Value Date   MICROALBUR 89.3* 05/17/2015   LDLCALC 53 08/09/2015  CREATININE 1.59* 08/09/2015         Medication List       This list is accurate as of: 08/14/15 11:59 PM.  Always use your most recent med list.               ACCU-CHEK AVIVA PLUS w/Device Kit  1 each by Does not apply route 4 (four) times daily -  with meals and at bedtime.     ACCU-CHEK FASTCLIX LANCETS Misc  Check blood sugar 4 times a day before meals and bedtime dx code 250.02 insulin requiring     acetaminophen 500 MG tablet  Commonly known as:  TYLENOL  Take 1 tablet (500 mg total) by mouth every 4 (four) hours as needed. Maximum Daily Dosage of 2000 mg     albuterol 108 (90 Base) MCG/ACT inhaler  Commonly known as:  PROAIR HFA  Inhale 2 puffs into the lungs every 6 (six) hours as needed for wheezing or shortness of breath.     atorvastatin 20 MG tablet  Commonly known as:  LIPITOR  Take 1 tablet (20 mg total) by mouth daily.      BESIVANCE 0.6 % Susp  Generic drug:  Besifloxacin HCl  Place 1 drop into both eyes 4 (four) times daily.     capsaicin 0.025 % cream  Commonly known as:  ZOSTRIX  Apply 1 application topically daily as needed (for pain).     docusate sodium 100 MG capsule  Commonly known as:  COLACE  Take 1 capsule (100 mg total) by mouth 2 (two) times daily.     ELIQUIS 5 MG Tabs tablet  Generic drug:  apixaban  TAKE 1 TABLET(5 MG) BY MOUTH TWICE DAILY     enalapril 20 MG tablet  Commonly known as:  VASOTEC  Take 2 tablets (40 mg total) by mouth daily.     furosemide 40 MG tablet  Commonly known as:  LASIX  Take 1 tablet (40 mg total) by mouth 2 (two) times daily.     glucose blood test strip  Commonly known as:  ACCU-CHEK AVIVA PLUS  USE TO CHECK BLOOD SUGAR FOUR TIMES DAILY BEFORE MEALS AND AT BEDTIME     HUMALOG KWIKPEN 100 UNIT/ML KiwkPen  Generic drug:  insulin lispro  SEE NOTES     HYDROcodone-acetaminophen 5-325 MG tablet  Commonly known as:  NORCO/VICODIN  Take 1 and 1/2 tablets by mouth every 6 hours as needed for pain     Insulin Degludec 200 UNIT/ML Sopn  Commonly known as:  TRESIBA FLEXTOUCH  Inject 150 Units into the skin daily.     insulin glargine 100 UNIT/ML injection  Commonly known as:  LANTUS  Inject 50 Units into the skin 2 (two) times daily. Reported on 08/14/2015     Insulin Glargine 300 UNIT/ML Sopn  Commonly known as:  TOUJEO SOLOSTAR  Inject 74 Units into the skin 2 (two) times daily.     Insulin Pen Needle 31G X 5 MM Misc  Use for insulin injection 5 times a day.     LUMIGAN 0.01 % Soln  Generic drug:  bimatoprost  Place 1 drop into both eyes at bedtime.     metoprolol succinate 25 MG 24 hr tablet  Commonly known as:  TOPROL-XL  Take 1 tablet (25 mg total) by mouth daily.     metoprolol tartrate 25 MG tablet  Commonly known as:  LOPRESSOR     omeprazole 20 MG capsule  Commonly known as:  PRILOSEC  Take 1 capsule (20 mg total) by mouth daily.       promethazine 25 MG tablet  Commonly known as:  PHENERGAN  TAKE 1/2 TO 1 TABLET BY MOUTH EVERY 6 HOURS AS NEEDED FOR NAUSEA     silver sulfADIAZINE 1 % cream  Commonly known as:  SILVADENE  Apply 1 application topically daily.     SIMBRINZA 1-0.2 % Susp  Generic drug:  Brinzolamide-Brimonidine  Place 1 drop into both eyes 2 (two) times daily. Take as instructed by Dr. Katy Fitch.     VICTOZA 18 MG/3ML Sopn  Generic drug:  Liraglutide  Inject 1.8 mg once daily at the same time     Vitamin D3 2000 units capsule  Take 1 capsule (2,000 Units total) by mouth daily.        Allergies:  Allergies  Allergen Reactions  . Vancomycin     REACTION: ARF    Past Medical History  Diagnosis Date  . Retinopathy   . Neuropathy, lower extremity   . Hyperlipidemia   . GERD (gastroesophageal reflux disease)   . Erectile dysfunction   . Osteomyelitis of ankle and foot ([place])   . Hemorrhoids   . HEARING LOSS, SENSORINEURAL, BILATERAL 01/03/2007    Seen by ENT Dr. Orpah Greek D. Redmond Baseman 01/03/07  . Hypertension   . Asthma   . OSA on CPAP     Nocturnal polysomnogram on 01/21/2010 showed severe obstructive sleep apnea/hypopnea syndrome, AHI 74.1 per hour with non positional events, moderately loud snoring, and oxygen desaturation to a nadir of 78% on room air.  CPAP was successfully titrated to 17 CWP, AHI 1.1 per hour using a large ResMed Mirage Quattro full-face mask with heated humidifier. Bruxism was noted.   . Type II diabetes mellitus ([place])     w/complication NOS, type II  . Edema, macular, due to secondary diabetes ([place])   . DIABETIC FOOT ULCER 06/20/2009  . Arthritis     "elbows & knees" (12/13/2014)  . CKD (chronic kidney disease), stage III   . History of echocardiogram     a. 04/2008 Echo: EF 50-55%, abnl LV relaxation, mildly dil LA.  . Morbid obesity [facility])     Past Surgical History  Procedure Laterality Date  . Toe amputation Left 01/21/2006    S/P radical irrigation and debridement,  left foot with third MTP joint amputation by Dr. Kathalene Frames. Mayer Camel.  . Amputation Left 12/15/2014    Procedure: LEFT SECOND TOE AMPUTATION ;  Surgeon: Dorna Leitz, MD;  Location: [place];  Service: Orthopedics;  Laterality: Left;    Family History  Problem Relation Age of Onset  . Diabetes Mother     also 2 siblings  . Heart attack Father 53  . Throat cancer Brother     Social History:  reports that he quit smoking about 5 years ago. His smoking use included Cigarettes. He has a 15 pack-year smoking history. He has never used smokeless tobacco. He reports that he drinks about 6.6 oz of alcohol per week. He reports that he does not use illicit drugs.    Review of Systems    Lipid management: taking Lipitor, triglycerides are now better    Lab Results  Component Value Date   CHOL 110 08/09/2015   HDL 27.40* 08/09/2015   LDLCALC 53 08/09/2015   TRIG 149.0 08/09/2015   CHOLHDL 4 08/09/2015            Hypertension: Has had hypertension for a few years  treated with enalapril,  taking 40 mg in the morning Also on Lasix and low-dose metoprolol  Last foot exam was in 10/16 Has long-standing numbness in his feet.  No significant pain or tingling    RENAL dysfunction: Stable   Physical Examination:  BP 138/76 mmHg  Pulse 78  Temp(Src) 98.3 F (36.8 C)  Resp 16  Ht _0  (1.981 m)  Wt 394 lb 3.2 oz (178.808 kg)  BMI 45.56 kg/m2  SpO2 95%      ASSESSMENT:  Diabetes type 2, uncontrolled with morbid obesity   His blood sugars are still overall high and labile Requiring large doses of basal insulin and fasting readings are still mostly high Some of his variability in blood sugars are related to variability in diet and carbohydrate intake even though he has seen the dietitian Also taking Victoza  Since fasting readings are still high in the morning and blood sugars tend to be overall lower in the afternoon he will reduce morning.  Toujeo 5 units and increase evening dose by 5  units However will try to get him switched to [place] if covered He has a supply of Humalog at this time and will plan to switch him to review-500 in the next visit  HYPERTENSION:medical controlled   HYPERLIPIDEMIA: well controlled now  PLAN:     Trial of Tresiba 150 units daily, mean values use 95 units of Toujeo in the evening and 55 in the morning  More consistent diet especially snacks and sweets in the evening  Additional 5-10 units of Humalog at meal times if eating more carbohydrate  Will use U-500 insulin on his next visit especially if [place] not effective  Follow instructions given by dietitian and schedule follow-up  More readings 2 hours after dinner  Regular walking for exercise   Patient Instructions  Toujeo 55  in the morning and 95 in evening.  If using Tyler Aas will dose 150 units in pm 1x daily, replaces TOUJEO   GO UP OR DOWN 10 UNITS ON Humalog based on meal size  Check blood sugars on waking up 5 times a week Also check blood sugars about 2 hours after a meal and do this after different meals by rotation  Recommended blood sugar levels on waking up is 90-130 and about 2 hours after meal is 130-160  Please bring your blood sugar monitor to each visit, thank you        [facility] 08/15/2015, 8:55 AM   Note: This office note was prepared with Dragon voice recognition system technology. Any transcriptional errors that result from this process are unintentional.

## 2015-08-14 NOTE — Patient Instructions (Addendum)
Toujeo 55  in the morning and 95 in evening.  If using [place] Aas will dose 150 units in pm 1x daily, replaces TOUJEO   GO UP OR DOWN 10 UNITS ON Humalog based on meal size  Check blood sugars on waking up 5 times a week Also check blood sugars about 2 hours after a meal and do this after different meals by rotation  Recommended blood sugar levels on waking up is 90-130 and about 2 hours after meal is 130-160  Please bring your blood sugar monitor to each visit, thank you

## 2015-08-15 ENCOUNTER — Other Ambulatory Visit: Payer: Self-pay | Admitting: *Deleted

## 2015-08-15 MED ORDER — INSULIN GLARGINE 300 UNIT/ML ~~LOC~~ SOPN: 60.0000 [IU] | PEN_INJECTOR | Freq: Two times a day (BID) | SUBCUTANEOUS | Status: DC

## 2015-08-29 ENCOUNTER — Other Ambulatory Visit: Payer: Self-pay | Admitting: Internal Medicine

## 2015-08-29 ENCOUNTER — Other Ambulatory Visit: Payer: Self-pay | Admitting: Student in an Organized Health Care Education/Training Program

## 2015-08-30 ENCOUNTER — Other Ambulatory Visit: Payer: Self-pay | Admitting: *Deleted

## 2015-08-30 ENCOUNTER — Telehealth: Payer: Self-pay | Admitting: Endocrinology

## 2015-08-30 MED ORDER — INSULIN REGULAR HUMAN (CONC) 500 UNIT/ML ~~LOC~~ SOPN: PEN_INJECTOR | SUBCUTANEOUS | Status: DC

## 2015-08-30 NOTE — Telephone Encounter (Signed)
I didn't say anything about changing the Novalog, he said Dr Dwyane Dee was changing to a different medication he didn't know what it was.

## 2015-08-30 NOTE — Telephone Encounter (Signed)
Please see below, I didn't see anything about changing Humalog? Please advise.

## 2015-08-30 NOTE — Telephone Encounter (Signed)
Patient stated that he is out of his Humalog and Dr Dwyane Dee is changing him over to a different  insulin medication, he only have enough for one more day  Send to [facility] - 08/29/15  5:10

## 2015-08-30 NOTE — Telephone Encounter (Signed)
Change to Humulin R U-500 Kwik pen, same doses, to take 30 mix before meals

## 2015-09-06 ENCOUNTER — Other Ambulatory Visit: Payer: Self-pay

## 2015-09-06 ENCOUNTER — Telehealth: Payer: Self-pay | Admitting: Endocrinology

## 2015-09-06 MED ORDER — OMEPRAZOLE 20 MG PO CPDR: 20.0000 mg | DELAYED_RELEASE_CAPSULE | Freq: Every day | ORAL | Status: DC

## 2015-09-06 NOTE — Telephone Encounter (Signed)
Pt needs clarification on insulin. Also needs med list cleaned up please advise

## 2015-09-06 NOTE — Telephone Encounter (Signed)
Pt called and said he needs more insulin sent to his pharmacy.

## 2015-09-09 DIAGNOSIS — H5203 Hypermetropia, bilateral: Secondary | ICD-10-CM | POA: Diagnosis not present

## 2015-09-09 DIAGNOSIS — E119 Type 2 diabetes mellitus without complications: Secondary | ICD-10-CM | POA: Diagnosis not present

## 2015-09-16 NOTE — Addendum Note (Signed)
Addended by: Forde Dandy on: 09/16/2015 07:29 PM   Modules accepted: Orders, Medications

## 2015-09-17 ENCOUNTER — Telehealth: Payer: Self-pay | Admitting: Dietician

## 2015-09-17 ENCOUNTER — Encounter: Payer: Self-pay | Admitting: Internal Medicine

## 2015-09-17 ENCOUNTER — Ambulatory Visit (INDEPENDENT_AMBULATORY_CARE_PROVIDER_SITE_OTHER): Payer: Medicare Other | Admitting: Internal Medicine

## 2015-09-17 VITALS — BP 151/71 | HR 77 | Temp 98.1°F | Ht 78.0 in | Wt 392.2 lb

## 2015-09-17 DIAGNOSIS — G894 Chronic pain syndrome: Secondary | ICD-10-CM | POA: Diagnosis not present

## 2015-09-17 DIAGNOSIS — Z794 Long term (current) use of insulin: Secondary | ICD-10-CM

## 2015-09-17 DIAGNOSIS — Z7901 Long term (current) use of anticoagulants: Secondary | ICD-10-CM

## 2015-09-17 DIAGNOSIS — E1165 Type 2 diabetes mellitus with hyperglycemia: Secondary | ICD-10-CM

## 2015-09-17 DIAGNOSIS — I129 Hypertensive chronic kidney disease with stage 1 through stage 4 chronic kidney disease, or unspecified chronic kidney disease: Secondary | ICD-10-CM

## 2015-09-17 DIAGNOSIS — L97521 Non-pressure chronic ulcer of other part of left foot limited to breakdown of skin: Secondary | ICD-10-CM

## 2015-09-17 DIAGNOSIS — E1122 Type 2 diabetes mellitus with diabetic chronic kidney disease: Secondary | ICD-10-CM

## 2015-09-17 DIAGNOSIS — I10 Essential (primary) hypertension: Secondary | ICD-10-CM

## 2015-09-17 DIAGNOSIS — N183 Chronic kidney disease, stage 3 unspecified: Secondary | ICD-10-CM

## 2015-09-17 DIAGNOSIS — I480 Paroxysmal atrial fibrillation: Secondary | ICD-10-CM | POA: Diagnosis not present

## 2015-09-17 DIAGNOSIS — Z79891 Long term (current) use of opiate analgesic: Secondary | ICD-10-CM

## 2015-09-17 DIAGNOSIS — IMO0002 Reserved for concepts with insufficient information to code with codable children: Secondary | ICD-10-CM

## 2015-09-17 DIAGNOSIS — I4891 Unspecified atrial fibrillation: Secondary | ICD-10-CM

## 2015-09-17 DIAGNOSIS — Z872 Personal history of diseases of the skin and subcutaneous tissue: Secondary | ICD-10-CM

## 2015-09-17 DIAGNOSIS — Z79899 Other long term (current) drug therapy: Secondary | ICD-10-CM

## 2015-09-17 LAB — GLUCOSE, CAPILLARY: GLUCOSE-CAPILLARY: 159 mg/dL — AB (ref 65–99)

## 2015-09-17 NOTE — Patient Instructions (Addendum)
-   It was a pleasure seeing you today - Please follow up with Dr. Dwyane Dee for your diabetes - I have given you a prescription for your diabetic shoes - We will check your urine today - Please follow up in 3 months - We will start tapering your pain meds down for your next prescription

## 2015-09-17 NOTE — Telephone Encounter (Signed)
Asked to assist patient with obtaining emergency refill of U-500 Humulin insulin because he left one of his pens in [place] and cannot get it refilled until 09/23/15. Our pharmacist suggested that  [facility] could do an emergency refill, but they said they cannot unless the insurance overrides it. [facility] suggested patient or we call his insurance and request an override.

## 2015-09-18 DIAGNOSIS — G894 Chronic pain syndrome: Secondary | ICD-10-CM | POA: Insufficient documentation

## 2015-09-18 NOTE — Assessment & Plan Note (Signed)
BP Readings from Last 3 Encounters:  09/17/15 151/71  08/14/15 138/76  07/03/15 158/80    Lab Results  Component Value Date   NA 137 08/09/2015   K 4.1 08/09/2015   CREATININE 1.59* 08/09/2015    Assessment: Blood pressure control:  fair Progress toward BP goal:   deteriorated Comments: patient did not take meds this AM  Plan: Medications:  continue current medications Educational resources provided: brochure Self management tools provided:   Other plans: Will f/u on next visit. Patient encouraged to be compliant with his meds

## 2015-09-18 NOTE — Assessment & Plan Note (Signed)
-   creatinine is stable. Will continue to monitor periodically
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
## 2015-09-18 NOTE — Telephone Encounter (Signed)
Called patient and asked him to call his insurance company and explain to them what happened and see if they will override the limitation on the pharmacy to refill his U-500 humulin early. He agreed to do this and knew where to find their phone number. He also greed to keep [place] posted.

## 2015-09-18 NOTE — Progress Notes (Signed)
   Subjective:    Patient ID: [name], male    DOB: November 06, 1950, 65 y.o.   MRN: [id number]  HPI Patient seen and examined. He is here for routine follow up of his DM and HTN. He feels well. He has no new complaints   Review of Systems  Constitutional: Negative.   HENT: Negative.   Eyes: Negative.   Respiratory: Negative.   Cardiovascular: Positive for leg swelling. Negative for chest pain and palpitations.  Gastrointestinal: Negative.   Musculoskeletal: Negative.   Skin: Negative.   Neurological: Negative.   Psychiatric/Behavioral: Negative.        Objective:   Physical Exam  Constitutional: He is oriented to person, place, and time. He appears well-developed and well-nourished.  HENT:  Head: Normocephalic and atraumatic.  Cardiovascular: Normal rate, regular rhythm and normal heart sounds.   Pulmonary/Chest: Effort normal and breath sounds normal. No respiratory distress. He has no wheezes. He has no rales.  Abdominal: Soft. Bowel sounds are normal. He exhibits no distension. There is no tenderness.  Musculoskeletal: Normal range of motion. He exhibits no tenderness.  Trace b/l LE edema  Neurological: He is alert and oriented to person, place, and time.  Skin: Skin is warm and dry. No rash noted. No erythema.  Psychiatric: He has a normal mood and affect. His behavior is normal.          Assessment & Plan:  Please see problem based charting for assessment and plan:

## 2015-09-18 NOTE — Assessment & Plan Note (Signed)
-   Foot ulcer completely healed - No further treatment or work up required at this time

## 2015-09-18 NOTE — Assessment & Plan Note (Signed)
Lab Results  Component Value Date   HGBA1C 8.5* 08/09/2015   HGBA1C 8.0 06/04/2015   HGBA1C 8.7 03/12/2015     Assessment: Diabetes control:  fair Progress toward A1C goal:   near goal Comments: Patient follows with Dr. Dwyane Dee for his diabetes  Plan: Medications:  continue current medications Home glucose monitoring: Frequency:   Timing:   Instruction/counseling given: reminded to bring blood glucose meter & log to each visit and discussed foot care Educational resources provided: brochure (see Dr. Dwyane Dee for) Self management tools provided: copy of home glucose meter download, home glucose logbook Other plans:

## 2015-09-18 NOTE — Telephone Encounter (Signed)
Patient does not have a follow-up appointment made, please schedule within the next 2 weeks with labs

## 2015-09-18 NOTE — Assessment & Plan Note (Signed)
-   No new complaints. No CP, no SOB - Patient likely in NSR currently - RRR on exam - Will c/w metoprolol and eliquis

## 2015-09-19 NOTE — Telephone Encounter (Signed)
Thank you for your help [name]! 

## 2015-09-22 LAB — TOXASSURE SELECT,+ANTIDEPR,UR: PDF: 0

## 2015-09-26 ENCOUNTER — Telehealth: Payer: Self-pay | Admitting: Internal Medicine

## 2015-09-26 DIAGNOSIS — E1142 Type 2 diabetes mellitus with diabetic polyneuropathy: Secondary | ICD-10-CM

## 2015-09-26 MED ORDER — HYDROCODONE-ACETAMINOPHEN 5-325 MG PO TABS: ORAL_TABLET | ORAL | Status: DC

## 2015-09-26 NOTE — Addendum Note (Signed)
Addended by: Forde Dandy on: 09/26/2015 08:19 PM   Modules accepted: Orders, Medications

## 2015-09-26 NOTE — Telephone Encounter (Signed)
Pt needs pain medication refill.  

## 2015-09-26 NOTE — Telephone Encounter (Signed)
Pt informed

## 2015-09-26 NOTE — Telephone Encounter (Signed)
Last written for 3 months 06/28/15

## 2015-10-10 ENCOUNTER — Other Ambulatory Visit (INDEPENDENT_AMBULATORY_CARE_PROVIDER_SITE_OTHER): Payer: Medicare Other

## 2015-10-10 DIAGNOSIS — Z794 Long term (current) use of insulin: Secondary | ICD-10-CM | POA: Diagnosis not present

## 2015-10-10 DIAGNOSIS — IMO0002 Reserved for concepts with insufficient information to code with codable children: Secondary | ICD-10-CM

## 2015-10-10 DIAGNOSIS — E0865 Diabetes mellitus due to underlying condition with hyperglycemia: Secondary | ICD-10-CM | POA: Diagnosis not present

## 2015-10-10 DIAGNOSIS — E0821 Diabetes mellitus due to underlying condition with diabetic nephropathy: Secondary | ICD-10-CM | POA: Diagnosis not present

## 2015-10-10 LAB — GLUCOSE, RANDOM: GLUCOSE: 215 mg/dL — AB (ref 70–99)

## 2015-10-11 LAB — FRUCTOSAMINE: FRUCTOSAMINE: 280 umol/L (ref 0–285)

## 2015-10-15 ENCOUNTER — Other Ambulatory Visit: Payer: Self-pay | Admitting: *Deleted

## 2015-10-15 ENCOUNTER — Ambulatory Visit (INDEPENDENT_AMBULATORY_CARE_PROVIDER_SITE_OTHER): Payer: Medicare Other | Admitting: Endocrinology

## 2015-10-15 VITALS — BP 132/80 | HR 81 | Temp 98.7°F | Resp 14 | Ht 78.0 in | Wt 382.2 lb

## 2015-10-15 DIAGNOSIS — E1165 Type 2 diabetes mellitus with hyperglycemia: Secondary | ICD-10-CM

## 2015-10-15 DIAGNOSIS — Z794 Long term (current) use of insulin: Secondary | ICD-10-CM | POA: Diagnosis not present

## 2015-10-15 MED ORDER — INSULIN REGULAR HUMAN (CONC) 500 UNIT/ML ~~LOC~~ SOPN: PEN_INJECTOR | SUBCUTANEOUS | Status: DC

## 2015-10-15 NOTE — Progress Notes (Signed)
Patient ID: [name], male   DOB: [birth year], 65 y.o.   MRN: [id number]           Reason for Appointment: Follow-up for Type 2 Diabetes  Referring physician: NARENDRA  History of Present Illness:          Date of diagnosis of type 2 diabetes mellitus: 2007       Background history:   He was diagnosed to have diabetes when he had an ulcer on his left third toe which led to amputation.  His glucose was apparently about 400 and he was started on insulin at that time He does not know if he has taken any oral hypoglycemic drugs in the past His A1c in 2016 has been consistently over 8%  Recent history:   INSULIN regimen is:  TOUJEO  60 in the morning and 100 in the evening HUMULIN U-500 insulin: Take 10 units in the morning ,40 units before lunch and 50 units before supper.   On his initial consultation was started on Victoza and his insulin doses were adjusted  However A1c is still about 8-8.5  He was supposed to switch to Toujeo to [place] but this was not covered by insurance  Also since 2/17 has taken the U-500 insulin instead of Humalog  Current blood sugar patterns and problems identified:  FASTING blood sugars are inconsistent but relatively better than before  However on an average blood sugars are overall higher than the last time on his download  He did not understand need to take the U-500 insulin 30 minutes before eating and is taking it at meal time and sometimes after eating  He has however lost weight  Blood sugars are variable throughout the day but tends to be higher mid-day and late evening  Non-insulin hypoglycemic drugs the patient is taking are: None      Side effects from medications have been: None  Compliance with the medical regimen: Fair  Hypoglycemia:   occasionally overnight as above  Glucose monitoring:  done up to 3  times a day         Glucometer:  Accu-Chek     Blood Glucose readings by  Download as follows  Mean values apply above  for all meters except median for One Touch  PRE-MEAL Fasting Lunch Dinner Bedtime Overall  Glucose range: 131-299  147-308  129-228  194-397  78-397   Mean/median: 162  213  169  278  198     Self-care: The diet that the patient has been following is: None    Typical meal intake: Breakfast is sometimes fruit like a banana, usually eating eggs, ham and grits at lunch.   Has snacks with skins, chips   Dinner at 6 pm He drinks grapefruit or V-8 juice and diet drinks                Dietician consultation: 12/16               Exercise: walking up to 30 minutes, 3x/week   Weight history: Previous range 260-410  Wt Readings from Last 3 Encounters:  10/15/15 382 lb 3.2 oz (173.365 kg)  09/17/15 392 lb 3.2 oz (177.901 kg)  08/14/15 394 lb 3.2 oz (178.808 kg)    Glycemic control:   Lab Results  Component Value Date   HGBA1C 8.5* 08/09/2015   HGBA1C 8.0 06/04/2015   HGBA1C 8.7 03/12/2015   Lab Results  Component Value Date   MICROALBUR 89.3* 05/17/2015  LDLCALC 53 08/09/2015   CREATININE 1.59* 08/09/2015         Medication List       This list is accurate as of: 10/15/15  3:36 PM.  Always use your most recent med list.               ACCU-CHEK AVIVA PLUS w/Device Kit  1 each by Does not apply route 4 (four) times daily -  with meals and at bedtime.     ACCU-CHEK FASTCLIX LANCETS Misc  Check blood sugar 4 times a day before meals and bedtime dx code 250.02 insulin requiring     acetaminophen 500 MG tablet  Commonly known as:  TYLENOL  Take 1 tablet (500 mg total) by mouth every 4 (four) hours as needed. Maximum Daily Dosage of 2000 mg     albuterol 108 (90 Base) MCG/ACT inhaler  Commonly known as:  PROAIR HFA  Inhale 2 puffs into the lungs every 6 (six) hours as needed for wheezing or shortness of breath.     atorvastatin 20 MG tablet  Commonly known as:  LIPITOR  Take 1 tablet (20 mg total) by mouth daily.     BESIVANCE 0.6 % Susp  Generic drug:   Besifloxacin HCl  Place 1 drop into both eyes 4 (four) times daily.     capsaicin 0.025 % cream  Commonly known as:  ZOSTRIX  Apply 1 application topically daily as needed (for pain).     docusate sodium 100 MG capsule  Commonly known as:  COLACE  Take 1 capsule (100 mg total) by mouth 2 (two) times daily.     ELIQUIS 5 MG Tabs tablet  Generic drug:  apixaban  TAKE 1 TABLET(5 MG) BY MOUTH TWICE DAILY     enalapril 20 MG tablet  Commonly known as:  VASOTEC  TAKE 2 TABLETS(40 MG) BY MOUTH DAILY     furosemide 40 MG tablet  Commonly known as:  LASIX  Take 1 tablet (40 mg total) by mouth 2 (two) times daily.     glucose blood test strip  Commonly known as:  ACCU-CHEK AVIVA PLUS  USE TO CHECK BLOOD SUGAR FOUR TIMES DAILY BEFORE MEALS AND AT BEDTIME     HUMALOG KWIKPEN 100 UNIT/ML KiwkPen  Generic drug:  insulin lispro  SEE NOTES     HYDROcodone-acetaminophen 5-325 MG tablet  Commonly known as:  NORCO/VICODIN  Take 1 and 1/2 tablets by mouth every 6 hours as needed for pain     Insulin Glargine 300 UNIT/ML Sopn  Commonly known as:  TOUJEO SOLOSTAR  Inject 60-90 Units into the skin 2 (two) times daily. Injects 60 units in am and 90 units at night     Insulin Pen Needle 31G X 5 MM Misc  Use for insulin injection 5 times a day.     insulin regular human CONCENTRATED 500 UNIT/ML kwikpen  Commonly known as:  HUMULIN R  Inject 20 units at breakfast, 60 units at lunch and 70 units at dinner.     LUMIGAN 0.01 % Soln  Generic drug:  bimatoprost  Place 1 drop into both eyes at bedtime.     metoprolol succinate 25 MG 24 hr tablet  Commonly known as:  TOPROL-XL  Take 1 tablet (25 mg total) by mouth daily.     omeprazole 20 MG capsule  Commonly known as:  PRILOSEC  Take 1 capsule (20 mg total) by mouth daily.     promethazine 25 MG tablet  Commonly known as:  PHENERGAN  TAKE 1/2 TO 1 TABLET BY MOUTH EVERY 6 HOURS AS NEEDED FOR NAUSEA     silver sulfADIAZINE 1 % cream    Commonly known as:  SILVADENE  Apply 1 application topically daily.     SIMBRINZA 1-0.2 % Susp  Generic drug:  Brinzolamide-Brimonidine  Place 1 drop into both eyes 2 (two) times daily. Take as instructed by Dr. Katy Fitch.     VICTOZA 18 MG/3ML Sopn  Generic drug:  Liraglutide  Inject 1.8 mg once daily at the same time     Vitamin D3 2000 units capsule  Take 1 capsule (2,000 Units total) by mouth daily.        Allergies:  Allergies  Allergen Reactions  . Vancomycin     REACTION: ARF    Past Medical History  Diagnosis Date  . Retinopathy   . Neuropathy, lower extremity   . Hyperlipidemia   . GERD (gastroesophageal reflux disease)   . Erectile dysfunction   . Osteomyelitis of ankle and foot ([place])   . Hemorrhoids   . HEARING LOSS, SENSORINEURAL, BILATERAL 01/03/2007    Seen by ENT Dr. Orpah Greek D. Redmond Baseman 01/03/07  . Hypertension   . Asthma   . OSA on CPAP     Nocturnal polysomnogram on 01/21/2010 showed severe obstructive sleep apnea/hypopnea syndrome, AHI 74.1 per hour with non positional events, moderately loud snoring, and oxygen desaturation to a nadir of 78% on room air.  CPAP was successfully titrated to 17 CWP, AHI 1.1 per hour using a large ResMed Mirage Quattro full-face mask with heated humidifier. Bruxism was noted.   . Type II diabetes mellitus ([place])     w/complication NOS, type II  . Edema, macular, due to secondary diabetes ([place])   . DIABETIC FOOT ULCER 06/20/2009  . Arthritis     "elbows & knees" (12/13/2014)  . CKD (chronic kidney disease), stage III   . History of echocardiogram     a. 04/2008 Echo: EF 50-55%, abnl LV relaxation, mildly dil LA.  . Morbid obesity [facility])     Past Surgical History  Procedure Laterality Date  . Toe amputation Left 01/21/2006    S/P radical irrigation and debridement, left foot with third MTP joint amputation by Dr. Kathalene Frames. Mayer Camel.  . Amputation Left 12/15/2014    Procedure: LEFT SECOND TOE AMPUTATION ;  Surgeon: Dorna Leitz, MD;   Location: [facility];  Service: Orthopedics;  Laterality: Left;    Family History  Problem Relation Age of Onset  . Diabetes Mother     also 2 siblings  . Heart attack Father 16  . Throat cancer Brother     Social History:  reports that he quit smoking about 5 years ago. His smoking use included Cigarettes. He has a 15 pack-year smoking history. He has never used smokeless tobacco. He reports that he drinks about 6.6 oz of alcohol per week. He reports that he does not use illicit drugs.    Review of Systems    Lipid management: taking Lipitor, triglyceridesNormal    Lab Results  Component Value Date   CHOL 110 08/09/2015   HDL 27.40* 08/09/2015   LDLCALC 53 08/09/2015   TRIG 149.0 08/09/2015   CHOLHDL 4 08/09/2015            Hypertension: Has had hypertension for a few years treated with enalapril,  taking 40 mg in the morning Also on Lasix and low-dose metoprolol  Last foot exam was in 10/16  Physical Examination:  BP 132/80 mmHg  Pulse 81  Temp(Src) 98.7 F (37.1 C)  Resp 14  Ht '6\' 6"'$  (1.981 m)  Wt 382 lb 3.2 oz (173.365 kg)  BMI 44.18 kg/m2  SpO2 97%      ASSESSMENT:  Diabetes type 2, uncontrolled with morbid obesity  See history of present illness for detailed discussion of current diabetes management, blood sugar patterns and problems identified Requiring large doses of both bolus and basal insulin  As discussed above his blood sugars are inconsistent and tend to be higher mid-day and evenings with the lowest ratings on waking up or sometimes in the afternoon Some of his variability in blood sugars are related to variability in diet  Also he has somewhat inconsistent time to take his mealtime insulin without timing it 30 minutes before eating Also taking Victoza Although his weight is somewhat better he is still quite insulin resistant  HYPERTENSION:  controlled   PLAN:     Discussed timing of various insulin doses in detail  He will take his  first U-500 insulin dose on waking up regardless of whether he is going to eat or not.  He will take the other doses 30 minutes before lunch and supper  For higher readings over 200 he will take 10 units extra U-500.  He will increase the doses of Humalog-500-20 units in the morning, 60 at lunch and 70 at supper  With this he was told to reduce his Toujeo to 90 units in the evening  Follow instructions given by dietitian   More readings 2 hours after dinner  Regular walking for exercise  Follow-up in 2 months   Patient Instructions  INSULIN regimen is:  TOUJEO  60 in the morning and 90in the evening   HUMULIN U-500 insulin: Take 20 in the morning 60  before lunch and 70units before supper.  Try to take 30 min before meals  If over 200 add 10 extra    Counseling time on subjects discussed above is over 50% of today's 25 minute visit    Gumaro Brightbill 10/15/2015, 3:36 PM   Note: This office note was prepared with Dragon voice recognition system technology. Any transcriptional errors that result from this process are unintentional.

## 2015-10-15 NOTE — Patient Instructions (Signed)
INSULIN regimen is:  TOUJEO  60 in the morning and 90in the evening   HUMULIN U-500 insulin: Take 20 in the morning 60  before lunch and 70units before supper.  Try to take 30 min before meals  If over 200 add 10 extra

## 2015-10-17 DIAGNOSIS — H40053 Ocular hypertension, bilateral: Secondary | ICD-10-CM | POA: Diagnosis not present

## 2015-10-17 DIAGNOSIS — Z961 Presence of intraocular lens: Secondary | ICD-10-CM | POA: Diagnosis not present

## 2015-10-17 DIAGNOSIS — H35353 Cystoid macular degeneration, bilateral: Secondary | ICD-10-CM | POA: Diagnosis not present

## 2015-10-23 ENCOUNTER — Other Ambulatory Visit: Payer: Self-pay | Admitting: Internal Medicine

## 2015-10-30 ENCOUNTER — Other Ambulatory Visit: Payer: Self-pay

## 2015-10-30 DIAGNOSIS — E1142 Type 2 diabetes mellitus with diabetic polyneuropathy: Secondary | ICD-10-CM

## 2015-10-30 NOTE — Telephone Encounter (Signed)
Pt requesting hydrocodone to be filled.  °

## 2015-10-30 NOTE — Telephone Encounter (Signed)
Last refill  09/26/15. Last appt and UDS 09/17/15.

## 2015-10-31 ENCOUNTER — Other Ambulatory Visit: Payer: Self-pay | Admitting: Endocrinology

## 2015-10-31 ENCOUNTER — Other Ambulatory Visit: Payer: Self-pay

## 2015-10-31 MED ORDER — HYDROCODONE-ACETAMINOPHEN 5-325 MG PO TABS: ORAL_TABLET | ORAL | Status: DC

## 2015-10-31 MED ORDER — INSULIN GLARGINE 300 UNIT/ML ~~LOC~~ SOPN: 60.0000 [IU] | PEN_INJECTOR | Freq: Two times a day (BID) | SUBCUTANEOUS | Status: DC

## 2015-10-31 MED ORDER — VICTOZA 18 MG/3ML ~~LOC~~ SOPN: PEN_INJECTOR | SUBCUTANEOUS | Status: DC

## 2015-10-31 MED ORDER — FUROSEMIDE 40 MG PO TABS: 40.0000 mg | ORAL_TABLET | Freq: Two times a day (BID) | ORAL | Status: DC

## 2015-10-31 NOTE — Telephone Encounter (Signed)
Patient need a refill of his Toujeo and Victoza send to  [id number] - [place], [place] AT [address] [phone] (Phone) [phone] (Fax)

## 2015-10-31 NOTE — Telephone Encounter (Signed)
Rhonda, This is your patient, Thanks! 

## 2015-10-31 NOTE — Telephone Encounter (Signed)
Pt requesting furosemide to be filled @ [facility].

## 2015-11-01 NOTE — Telephone Encounter (Signed)
Pt informed

## 2015-11-12 ENCOUNTER — Ambulatory Visit (INDEPENDENT_AMBULATORY_CARE_PROVIDER_SITE_OTHER): Payer: Medicare Other | Admitting: Sports Medicine

## 2015-11-12 ENCOUNTER — Encounter: Payer: Self-pay | Admitting: Sports Medicine

## 2015-11-12 ENCOUNTER — Ambulatory Visit (INDEPENDENT_AMBULATORY_CARE_PROVIDER_SITE_OTHER): Payer: Medicare Other

## 2015-11-12 VITALS — BP 106/60 | HR 80 | Resp 16

## 2015-11-12 DIAGNOSIS — M79672 Pain in left foot: Secondary | ICD-10-CM

## 2015-11-12 DIAGNOSIS — E1140 Type 2 diabetes mellitus with diabetic neuropathy, unspecified: Secondary | ICD-10-CM

## 2015-11-12 DIAGNOSIS — L97529 Non-pressure chronic ulcer of other part of left foot with unspecified severity: Secondary | ICD-10-CM | POA: Diagnosis not present

## 2015-11-12 DIAGNOSIS — L89891 Pressure ulcer of other site, stage 1: Secondary | ICD-10-CM | POA: Diagnosis not present

## 2015-11-12 DIAGNOSIS — E11621 Type 2 diabetes mellitus with foot ulcer: Secondary | ICD-10-CM | POA: Diagnosis not present

## 2015-11-12 DIAGNOSIS — Z89432 Acquired absence of left foot: Secondary | ICD-10-CM

## 2015-11-12 DIAGNOSIS — IMO0002 Reserved for concepts with insufficient information to code with codable children: Secondary | ICD-10-CM

## 2015-11-12 MED ORDER — AMOXICILLIN-POT CLAVULANATE 875-125 MG PO TABS: 1.0000 | ORAL_TABLET | Freq: Two times a day (BID) | ORAL | Status: DC

## 2015-11-12 NOTE — Progress Notes (Signed)
Patient ID: RISHARD DELANGE, male   DOB: 1950-12-02, 65 y.o.   MRN: [id number] Subjective: GARL SPEIGNER is a 65 y.o. male patient seen in office for evaluation of ulceration of the left foot that started since last weekend with draining. Patient has a history of diabetes and a blood glucose level today of '180mg'$ /dl.   Patient states that it is difficult for him to change the dressing so he has just been trying to keep it clean. Denies nausea/fever/vomiting/chills/night sweats/shortness of breath/pain. Patient has no other pedal complaints at this time.  Patient Active Problem List   Diagnosis Date Noted  . Chronic pain syndrome 09/18/2015  . Chronic fatigue 04/16/2015  . Preventative health care 03/12/2015  . Family history of coronary artery disease in father 12/14/2014  . Sleep apnea-on C-pap 12/14/2014  . Acute osteomyelitis of toe of left foot ([place])   . Diabetic foot infection ([place]) 12/13/2014  . Foot ulcer, left ([place]) 12/13/2014  . Paroxysmal atrial fibrillation, new onset 12/13/2014  . Elevated alkaline phosphatase level 06/11/2014  . Constipation 05/31/2014  . Vitamin D deficiency 11/17/2013  . Frequent falls 06/30/2013  . Mild memory disturbance 07/27/2012  . Anemia 02/10/2012  . Hypertension 11/18/2011  . Obstructive sleep apnea 12/12/2009  . EDEMA 02/13/2009  . Callus of foot 12/13/2008  . GERD 06/27/2008  . Diabetes type 2, uncontrolled ([place]) 12/28/2007  . DIABETIC MACULAR EDEMA 10/07/2007  . Dyslipidemia 08/15/2007  . BACKGROUND DIABETIC RETINOPATHY 08/15/2007  . ERECTILE DYSFUNCTION 05/31/2007  . HEARING LOSS, SENSORINEURAL, BILATERAL 01/03/2007  . Diabetic peripheral neuropathy associated with type 2 diabetes mellitus ([place]) 12/16/2006  . Chronic kidney disease, stage III (moderate) 12/01/2006  . SKIN LESION 09/27/2006  . Obesity BMI 46 08/23/2006  . STATUS, OTHER TOE(S) AMPUTATION 05/21/2006   Current Outpatient Prescriptions on File Prior to Visit   Medication Sig Dispense Refill  . ACCU-CHEK FASTCLIX LANCETS MISC Check blood sugar 4 times a day before meals and bedtime dx code 250.02 insulin requiring 102 each 6  . acetaminophen (TYLENOL) 500 MG tablet Take 1 tablet (500 mg total) by mouth every 4 (four) hours as needed. Maximum Daily Dosage of 2000 mg 100 tablet 0  . albuterol (PROAIR HFA) 108 (90 BASE) MCG/ACT inhaler Inhale 2 puffs into the lungs every 6 (six) hours as needed for wheezing or shortness of breath. 3 Inhaler 1  . atorvastatin (LIPITOR) 20 MG tablet Take 1 tablet (20 mg total) by mouth daily. 90 tablet 3  . BESIVANCE 0.6 % SUSP Place 1 drop into both eyes 4 (four) times daily.   12  . Blood Glucose Monitoring Suppl (ACCU-CHEK AVIVA PLUS) W/DEVICE KIT 1 each by Does not apply route 4 (four) times daily -  with meals and at bedtime. 1 kit 0  . capsaicin (ZOSTRIX) 0.025 % cream Apply 1 application topically daily as needed (for pain).    . Cholecalciferol (VITAMIN D3) 2000 UNITS capsule Take 1 capsule (2,000 Units total) by mouth daily. 90 capsule 0  . docusate sodium (COLACE) 100 MG capsule Take 1 capsule (100 mg total) by mouth 2 (two) times daily. (Patient taking differently: Take 200 mg by mouth daily as needed for mild constipation. ) 60 capsule 0  . ELIQUIS 5 MG TABS tablet TAKE 1 TABLET(5 MG) BY MOUTH TWICE DAILY 60 tablet 2  . enalapril (VASOTEC) 20 MG tablet TAKE 2 TABLETS(40 MG) BY MOUTH DAILY 90 tablet 3  . furosemide (LASIX) 40 MG tablet Take 1 tablet (40 mg  total) by mouth 2 (two) times daily. 60 tablet 3  . glucose blood (ACCU-CHEK AVIVA PLUS) test strip USE TO CHECK BLOOD SUGAR FOUR TIMES DAILY BEFORE MEALS AND AT BEDTIME 150 each 11  . HYDROcodone-acetaminophen (NORCO/VICODIN) 5-325 MG tablet Take 1 and 1/2 tablets by mouth every 6 hours as needed for pain 140 tablet 0  . Insulin Glargine (TOUJEO SOLOSTAR) 300 UNIT/ML SOPN Inject 60-90 Units into the skin 2 (two) times daily. Injects 60 units in am and 90 units at  night 9 pen 3  . Insulin Pen Needle 31G X 5 MM MISC Use for insulin injection 5 times a day. 150 each 5  . insulin regular human CONCENTRATED (HUMULIN R) 500 UNIT/ML kwikpen Inject 20 units at breakfast, 60 units at lunch and 70 units at dinner. 18 mL 3  . LUMIGAN 0.01 % SOLN Place 1 drop into both eyes at bedtime.     . metoprolol succinate (TOPROL-XL) 25 MG 24 hr tablet Take 1 tablet (25 mg total) by mouth daily. 90 tablet 3  . omeprazole (PRILOSEC) 20 MG capsule Take 1 capsule (20 mg total) by mouth daily. 90 capsule 0  . promethazine (PHENERGAN) 25 MG tablet TAKE 1/2 TO 1 TABLET BY MOUTH EVERY 6 HOURS AS NEEDED FOR NAUSEA 30 tablet 2  . silver sulfADIAZINE (SILVADENE) 1 % cream Apply 1 application topically daily. 50 g 0  . SIMBRINZA 1-0.2 % SUSP Place 1 drop into both eyes 2 (two) times daily. Take as instructed by Dr. Katy Fitch.    Marland Kitchen VICTOZA 18 MG/3ML SOPN Inject 1.8 mg once daily at the same time 9 mL 3   No current facility-administered medications on file prior to visit.   Allergies  Allergen Reactions  . Vancomycin     REACTION: ARF    Recent Results (from the past 2160 hour(s))  Glucose, capillary     Status: Abnormal   Collection Time: 09/17/15 11:22 AM  Result Value Ref Range   Glucose-Capillary 159 (H) 65 - 99 mg/dL  ToxAssure Select,+Antidepr,UR     Status: None   Collection Time: 09/17/15 11:41 AM  Result Value Ref Range   Report Summary FINAL     Comment: ==================================================================== TOXASSURE SELECT,+ANTIDEPR,UR ==================================================================== Test                             Result       Flag       Units Drug Present   Hydrocodone                    72                      ng/mg creat   Hydromorphone                  148                     ng/mg creat   Norhydrocodone                 170                     ng/mg creat    Sources of hydrocodone include scheduled prescription     medications. Hydromorphone and norhydrocodone are expected    metabolites of hydrocodone. Hydromorphone is also available as a    scheduled prescription medication. ==================================================================== Test  Result    Flag   Units      Ref Range   Creatinine              81               mg/dL      >=20 ==================================================================== Declared Medications:  Medication list was not  provided. ==================================================================== For clinical consultation, please call [phone]. ====================================================================    PDF .   Glucose, random     Status: Abnormal   Collection Time: 10/10/15  1:50 PM  Result Value Ref Range   Glucose, Bld 215 (H) 70 - 99 mg/dL  Fructosamine     Status: None   Collection Time: 10/10/15  1:50 PM  Result Value Ref Range   Fructosamine 280 0 - 285 umol/L    Comment: Published reference interval for apparently healthy subjects between age 59 and 28 is 26 - 285 umol/L and in a poorly controlled diabetic population is 228 - 563 umol/L with a mean of 396 umol/L.     Objective: There were no vitals filed for this visit.  General: Patient is awake, alert, oriented x 3 and in no acute distress.  Dermatology: Skin is warm and dry bilateral with a partial thickness ulceration present  Left plantar forefoot. Ulceration measures 4 cm x 2 cm x 0.3 cm extending into skin crease at amp site. There is a mildly macerated and keratotic border with a granular base. The ulceration does not  probe to bone. There is no malodor, no active drainage, no erythema, no edema. No other acute signs of infection.   Vascular: Dorsalis Pedis pulse = 0/4 Bilateral,  Posterior Tibial pulse = 1/4 Bilateral,  Capillary Fill Time < 5 seconds at remaining toes   Neurologic: Protective sensation absent bilateral using  SWMF.  Musculosketal: No Pain with palpation to ulcerated area on left. No pain with compression to calves bilateral. 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  Xrays, Left foot:Ampuation status of 2nd and 3rd toes at level of mets, posterior and inferior calcaneal spur, No acute bony destruction suggestive of osteomyelitis. No gas in soft tissues.   Assessment and Plan:  Problem List Items Addressed This Visit    None    Visit Diagnoses    Foot pain, left    -  Primary    Relevant Medications    amoxicillin-clavulanate (AUGMENTIN) 875-125 MG tablet    Other Relevant Orders    DG Foot Complete Left    Diabetic ulcer of left foot associated with type 2 diabetes mellitus (HCC)        Relevant Medications    amoxicillin-clavulanate (AUGMENTIN) 875-125 MG tablet    Other Relevant Orders    Wound culture    Foot amputation status, left (HCC)        2nd & 3rd toe amps    Type 2 diabetes mellitus with diabetic neuropathy, unspecified long term insulin use status ([place])            -Examined patient and discussed the progression of the wound and treatment alternatives. -Xrays reviewed - Excisionally dedbrided ulceration to healthy bleeding borders using a sterile chisel blade. Wound culture obtained; will call patient if change in antibiotics is needed -Applied Iodosorb and dry sterile dressing and instructed patient to get help at home until home nursing can be in place with daily dressings at home consisting of iodosorb and dry sterile dressing. -Rx Augmentin for preventive measures  - Advised  patient to go to the ER or return to office if the wound worsens or if constitutional symptoms are present. -Recommend wound healing shoe to dispense at next visit if not improved  -Patient to return to office in 2 weeks for follow up care and evaluation or sooner if problems arise.  Landis Martins, DPM

## 2015-11-13 ENCOUNTER — Telehealth: Payer: Self-pay | Admitting: *Deleted

## 2015-11-13 MED ORDER — CADEXOMER IODINE 0.9 % EX GEL: 1.0000 "application " | Freq: Every day | CUTANEOUS | Status: DC | PRN

## 2015-11-13 NOTE — Telephone Encounter (Signed)
Ok thanks for letting me know. Let me know if the Iodosorb gets denied if so we can try betadine to help clear up the maceration if the Iodosorb is denied.  Thanks Dr. Cannon Kettle

## 2015-11-13 NOTE — Telephone Encounter (Addendum)
Faxed referral form, pt clinicals, and demographics to [facility], then faxed [facility] Iodosorb order form, pt demographics to [facility].  0-5/09/2015-Mary - [place] states pt has [facility] and they do not participate with [facility], and she suggested [place].  I will fax [facility] referral forms and pt clinical and demographics. Done.  11/14/2015-Pt left message with name, DOB and phone number.  I left message informing pt, I was continuing to get him established with a [facility], that [facility] did not accept [facility] had not responded to the referral of 11/13/2015.  Pt returned my call and states the Iodosorb is not covered by his insurance and cost over $100.00.  I told pt I would call tomorrow with other instructions for his wound care.Pt states someone tried to call him from a home health care and they said they would call back. 11/15/2015-left message [facility] to confirm pt has been accepted in to their service, and to let them know there was a change in orders.  Rae Halsted states pt is now under their care and please fax new orders.  Faxed to [place] [phone], Dr. Leeanne Rio orders to apply betadine to gauze and apply as dressing daily after cleansing.  Eleonore Chiquito, RN states needs verbal order to use the Iodosorb pt has available,then begin the betadine dressings after the wound is cleansed.  Lattie Haw states can only go to pt every other day Monday, Wednesday, and Friday.  Dr. Marcene Duos to 3 days per week, and prn care.  11/18/2015-[facility] sent statement of Pt's Emotional Status indicating that pt has tendencies to depression.  I returned fax that they should inform pt's PCP.  12/04/2015-Pt states he saw Dr. Cannon Kettle yesterday. The home health nurse wants to know what medication is to be used on the wound, since the last visit.  I told pt I would ask Dr. Cannon Kettle if there is a change in treatment, and call again.  I reviewed 12/03/2015 clinicals and Dr.  Cannon Kettle ordered pt to continue with the betadine soaked dressing.  Left message informing pt of the continuation of the orders that were being performed prior to 12/03/2015 office visit.  I spoke with pt after he returned my call, and I informed he was to continue the betadine on gauze dressing as performed before the 12/03/2015 appt.  Pt states understanding.

## 2015-11-14 NOTE — Telephone Encounter (Signed)
Betadine is to be applied to the guaze as his dressing. No Soaking.

## 2015-11-15 ENCOUNTER — Encounter: Payer: Self-pay | Admitting: Pharmacist

## 2015-11-15 DIAGNOSIS — E1165 Type 2 diabetes mellitus with hyperglycemia: Secondary | ICD-10-CM | POA: Diagnosis not present

## 2015-11-15 DIAGNOSIS — N183 Chronic kidney disease, stage 3 (moderate): Secondary | ICD-10-CM | POA: Diagnosis not present

## 2015-11-15 DIAGNOSIS — E11621 Type 2 diabetes mellitus with foot ulcer: Secondary | ICD-10-CM | POA: Diagnosis not present

## 2015-11-15 DIAGNOSIS — E1122 Type 2 diabetes mellitus with diabetic chronic kidney disease: Secondary | ICD-10-CM | POA: Diagnosis not present

## 2015-11-15 DIAGNOSIS — G4733 Obstructive sleep apnea (adult) (pediatric): Secondary | ICD-10-CM | POA: Diagnosis not present

## 2015-11-15 DIAGNOSIS — I129 Hypertensive chronic kidney disease with stage 1 through stage 4 chronic kidney disease, or unspecified chronic kidney disease: Secondary | ICD-10-CM | POA: Diagnosis not present

## 2015-11-15 DIAGNOSIS — I480 Paroxysmal atrial fibrillation: Secondary | ICD-10-CM | POA: Diagnosis not present

## 2015-11-15 DIAGNOSIS — E11311 Type 2 diabetes mellitus with unspecified diabetic retinopathy with macular edema: Secondary | ICD-10-CM | POA: Diagnosis not present

## 2015-11-15 DIAGNOSIS — G894 Chronic pain syndrome: Secondary | ICD-10-CM | POA: Diagnosis not present

## 2015-11-15 DIAGNOSIS — L97421 Non-pressure chronic ulcer of left heel and midfoot limited to breakdown of skin: Secondary | ICD-10-CM | POA: Diagnosis not present

## 2015-11-15 DIAGNOSIS — E1142 Type 2 diabetes mellitus with diabetic polyneuropathy: Secondary | ICD-10-CM | POA: Diagnosis not present

## 2015-11-15 LAB — WOUND CULTURE: Gram Stain: NONE SEEN

## 2015-11-15 NOTE — Progress Notes (Addendum)
Patient ID: [name], male   DOB: [birth year], 65 y.o.   MRN: [id number]  [name] is a 65 y.o. male who was reviewed for monitoring of apixaban (Eliquis) therapy.    ASSESSMENT Indication(s): atrial fibrillation Duration: indefinite  Labs: Component Value Date/Time   AST 17 08/09/2015 1023   ALT 19 08/09/2015 1023   NA 137 08/09/2015 1023   K 4.1 08/09/2015 1023   CL 102 08/09/2015 1023   CO2 29 08/09/2015 1023   GLUCOSE 215* 10/10/2015 1350   HGBA1C 8.5* 08/09/2015 1023   HGBA1C 8.0 06/04/2015 1123   BUN 18 08/09/2015 1023   CREATININE 1.59* 08/09/2015 1023   CREATININE 1.61* 12/20/2014 1133   CALCIUM 9.1 08/09/2015 1023   GFRNONAA 45* 12/20/2014 1133   GFRNONAA 36* 12/16/2014 0345   WBC 3.9 04/16/2015 1037   WBC 7.0 12/16/2014 0345   HGB 11.1* 12/16/2014 0345   HCT 36.0* 04/16/2015 1037   HCT 35.2* 12/16/2014 0345   PLT 136* 04/16/2015 1037   PLT 190 12/16/2014 0345   apixaban (Eliquis) Dose: 5 mg BID  No signs/symptoms of bleeding or thromboembolism, good adherence per pharmacy refill history. No clinical concerns at this time.  [facility] Pharmacist 11/15/2015, 1:49 PM

## 2015-11-19 DIAGNOSIS — G4733 Obstructive sleep apnea (adult) (pediatric): Secondary | ICD-10-CM | POA: Diagnosis not present

## 2015-11-19 DIAGNOSIS — N183 Chronic kidney disease, stage 3 (moderate): Secondary | ICD-10-CM | POA: Diagnosis not present

## 2015-11-19 DIAGNOSIS — E11621 Type 2 diabetes mellitus with foot ulcer: Secondary | ICD-10-CM | POA: Diagnosis not present

## 2015-11-19 DIAGNOSIS — E1165 Type 2 diabetes mellitus with hyperglycemia: Secondary | ICD-10-CM | POA: Diagnosis not present

## 2015-11-19 DIAGNOSIS — E1142 Type 2 diabetes mellitus with diabetic polyneuropathy: Secondary | ICD-10-CM | POA: Diagnosis not present

## 2015-11-19 DIAGNOSIS — L97421 Non-pressure chronic ulcer of left heel and midfoot limited to breakdown of skin: Secondary | ICD-10-CM | POA: Diagnosis not present

## 2015-11-19 DIAGNOSIS — E1122 Type 2 diabetes mellitus with diabetic chronic kidney disease: Secondary | ICD-10-CM | POA: Diagnosis not present

## 2015-11-19 DIAGNOSIS — E11311 Type 2 diabetes mellitus with unspecified diabetic retinopathy with macular edema: Secondary | ICD-10-CM | POA: Diagnosis not present

## 2015-11-19 DIAGNOSIS — I480 Paroxysmal atrial fibrillation: Secondary | ICD-10-CM | POA: Diagnosis not present

## 2015-11-19 DIAGNOSIS — G894 Chronic pain syndrome: Secondary | ICD-10-CM | POA: Diagnosis not present

## 2015-11-19 DIAGNOSIS — I129 Hypertensive chronic kidney disease with stage 1 through stage 4 chronic kidney disease, or unspecified chronic kidney disease: Secondary | ICD-10-CM | POA: Diagnosis not present

## 2015-11-20 ENCOUNTER — Other Ambulatory Visit: Payer: Self-pay

## 2015-11-20 MED ORDER — OMEPRAZOLE 20 MG PO CPDR: 20.0000 mg | DELAYED_RELEASE_CAPSULE | Freq: Every day | ORAL | Status: DC

## 2015-11-20 NOTE — Telephone Encounter (Signed)
Pt requesting omeprazole to be filled @ [facility] on [address].

## 2015-11-21 DIAGNOSIS — I129 Hypertensive chronic kidney disease with stage 1 through stage 4 chronic kidney disease, or unspecified chronic kidney disease: Secondary | ICD-10-CM | POA: Diagnosis not present

## 2015-11-21 DIAGNOSIS — E11621 Type 2 diabetes mellitus with foot ulcer: Secondary | ICD-10-CM | POA: Diagnosis not present

## 2015-11-21 DIAGNOSIS — E1142 Type 2 diabetes mellitus with diabetic polyneuropathy: Secondary | ICD-10-CM | POA: Diagnosis not present

## 2015-11-21 DIAGNOSIS — G4733 Obstructive sleep apnea (adult) (pediatric): Secondary | ICD-10-CM | POA: Diagnosis not present

## 2015-11-21 DIAGNOSIS — E1165 Type 2 diabetes mellitus with hyperglycemia: Secondary | ICD-10-CM | POA: Diagnosis not present

## 2015-11-21 DIAGNOSIS — I480 Paroxysmal atrial fibrillation: Secondary | ICD-10-CM | POA: Diagnosis not present

## 2015-11-21 DIAGNOSIS — N183 Chronic kidney disease, stage 3 (moderate): Secondary | ICD-10-CM | POA: Diagnosis not present

## 2015-11-21 DIAGNOSIS — E11311 Type 2 diabetes mellitus with unspecified diabetic retinopathy with macular edema: Secondary | ICD-10-CM | POA: Diagnosis not present

## 2015-11-21 DIAGNOSIS — G894 Chronic pain syndrome: Secondary | ICD-10-CM | POA: Diagnosis not present

## 2015-11-21 DIAGNOSIS — E1122 Type 2 diabetes mellitus with diabetic chronic kidney disease: Secondary | ICD-10-CM | POA: Diagnosis not present

## 2015-11-21 DIAGNOSIS — L97421 Non-pressure chronic ulcer of left heel and midfoot limited to breakdown of skin: Secondary | ICD-10-CM | POA: Diagnosis not present

## 2015-11-22 DIAGNOSIS — Z794 Long term (current) use of insulin: Secondary | ICD-10-CM | POA: Diagnosis not present

## 2015-11-22 DIAGNOSIS — L97521 Non-pressure chronic ulcer of other part of left foot limited to breakdown of skin: Secondary | ICD-10-CM | POA: Diagnosis not present

## 2015-11-22 DIAGNOSIS — E11621 Type 2 diabetes mellitus with foot ulcer: Secondary | ICD-10-CM | POA: Diagnosis not present

## 2015-11-27 ENCOUNTER — Telehealth: Payer: Self-pay | Admitting: Internal Medicine

## 2015-11-27 ENCOUNTER — Other Ambulatory Visit: Payer: Self-pay | Admitting: *Deleted

## 2015-11-27 DIAGNOSIS — E11311 Type 2 diabetes mellitus with unspecified diabetic retinopathy with macular edema: Secondary | ICD-10-CM | POA: Diagnosis not present

## 2015-11-27 DIAGNOSIS — E1142 Type 2 diabetes mellitus with diabetic polyneuropathy: Secondary | ICD-10-CM | POA: Diagnosis not present

## 2015-11-27 DIAGNOSIS — N183 Chronic kidney disease, stage 3 (moderate): Secondary | ICD-10-CM | POA: Diagnosis not present

## 2015-11-27 DIAGNOSIS — G4733 Obstructive sleep apnea (adult) (pediatric): Secondary | ICD-10-CM | POA: Diagnosis not present

## 2015-11-27 DIAGNOSIS — E1165 Type 2 diabetes mellitus with hyperglycemia: Secondary | ICD-10-CM | POA: Diagnosis not present

## 2015-11-27 DIAGNOSIS — I129 Hypertensive chronic kidney disease with stage 1 through stage 4 chronic kidney disease, or unspecified chronic kidney disease: Secondary | ICD-10-CM | POA: Diagnosis not present

## 2015-11-27 DIAGNOSIS — L97421 Non-pressure chronic ulcer of left heel and midfoot limited to breakdown of skin: Secondary | ICD-10-CM | POA: Diagnosis not present

## 2015-11-27 DIAGNOSIS — I480 Paroxysmal atrial fibrillation: Secondary | ICD-10-CM | POA: Diagnosis not present

## 2015-11-27 DIAGNOSIS — E11621 Type 2 diabetes mellitus with foot ulcer: Secondary | ICD-10-CM | POA: Diagnosis not present

## 2015-11-27 DIAGNOSIS — G894 Chronic pain syndrome: Secondary | ICD-10-CM | POA: Diagnosis not present

## 2015-11-27 DIAGNOSIS — E1122 Type 2 diabetes mellitus with diabetic chronic kidney disease: Secondary | ICD-10-CM | POA: Diagnosis not present

## 2015-11-27 MED ORDER — HYDROCODONE-ACETAMINOPHEN 5-325 MG PO TABS: ORAL_TABLET | ORAL | Status: DC

## 2015-11-27 NOTE — Telephone Encounter (Signed)
Last visit: 09/17/2015 Next appointment: 12/24/2015 Last UDS: 09/17/2015  Noticed patient also had Tylenol ordered PRN

## 2015-11-27 NOTE — Telephone Encounter (Signed)
Pain med refill °

## 2015-12-03 ENCOUNTER — Ambulatory Visit (INDEPENDENT_AMBULATORY_CARE_PROVIDER_SITE_OTHER): Payer: Medicare Other | Admitting: Sports Medicine

## 2015-12-03 ENCOUNTER — Encounter: Payer: Self-pay | Admitting: Sports Medicine

## 2015-12-03 DIAGNOSIS — Z89432 Acquired absence of left foot: Secondary | ICD-10-CM

## 2015-12-03 DIAGNOSIS — E1140 Type 2 diabetes mellitus with diabetic neuropathy, unspecified: Secondary | ICD-10-CM | POA: Diagnosis not present

## 2015-12-03 DIAGNOSIS — L97529 Non-pressure chronic ulcer of other part of left foot with unspecified severity: Secondary | ICD-10-CM

## 2015-12-03 DIAGNOSIS — E11621 Type 2 diabetes mellitus with foot ulcer: Secondary | ICD-10-CM | POA: Diagnosis not present

## 2015-12-03 DIAGNOSIS — IMO0002 Reserved for concepts with insufficient information to code with codable children: Secondary | ICD-10-CM

## 2015-12-03 DIAGNOSIS — L89891 Pressure ulcer of other site, stage 1: Secondary | ICD-10-CM

## 2015-12-03 DIAGNOSIS — M79672 Pain in left foot: Secondary | ICD-10-CM | POA: Diagnosis not present

## 2015-12-03 NOTE — Progress Notes (Signed)
Patient ID: [name], male   DOB: [birth year], 65 y.o.   MRN: [id number]   Subjective: [name] is a 66 y.o. male patient seen in office for follow up evaluation of ulceration of the left ulceration. Patient has a history of diabetes and a blood glucose level today of 146m/dl.   Patient states that he has nursing helping to change the dressing and has completed antibiotics with no problems. Denies nausea/fever/vomiting/chills/night sweats/shortness of breath/pain. Patient has no other pedal complaints at this time.  Patient Active Problem List   Diagnosis Date Noted  . Chronic pain syndrome 09/18/2015  . Chronic fatigue 04/16/2015  . Preventative health care 03/12/2015  . Family history of coronary artery disease in father 12/14/2014  . Sleep apnea-on C-pap 12/14/2014  . Acute osteomyelitis of toe of left foot ([place]   . Diabetic foot infection ([place] [employer] 12/13/2014  . Foot ulcer, left ([place] 12/13/2014  . Paroxysmal atrial fibrillation, new onset 12/13/2014  . Elevated alkaline phosphatase level 06/11/2014  . Constipation 05/31/2014  . Vitamin D deficiency 11/17/2013  . Frequent falls 06/30/2013  . Mild memory disturbance 07/27/2012  . Anemia 02/10/2012  . Hypertension 11/18/2011  . Obstructive sleep apnea 12/12/2009  . EDEMA 02/13/2009  . Callus of foot 12/13/2008  . GERD 06/27/2008  . Diabetes type 2, uncontrolled ([place] 12/28/2007  . DIABETIC MACULAR EDEMA 10/07/2007  . Dyslipidemia 08/15/2007  . BACKGROUND DIABETIC RETINOPATHY 08/15/2007  . ERECTILE DYSFUNCTION 05/31/2007  . HEARING LOSS, SENSORINEURAL, BILATERAL 01/03/2007  . Diabetic peripheral neuropathy associated with type 2 diabetes mellitus ([place] 12/16/2006  . Chronic kidney disease, stage III (moderate) 12/01/2006  . SKIN LESION 09/27/2006  . Obesity BMI 46 08/23/2006  . STATUS, OTHER TOE(S) AMPUTATION 05/21/2006   Current Outpatient Prescriptions on File Prior to Visit  Medication Sig Dispense Refill   . ACCU-CHEK FASTCLIX LANCETS MISC Check blood sugar 4 times a day before meals and bedtime dx code 250.02 insulin requiring 102 each 6  . acetaminophen (TYLENOL) 500 MG tablet Take 1 tablet (500 mg total) by mouth every 4 (four) hours as needed. Maximum Daily Dosage of 2000 mg 100 tablet 0  . albuterol (PROAIR HFA) 108 (90 BASE) MCG/ACT inhaler Inhale 2 puffs into the lungs every 6 (six) hours as needed for wheezing or shortness of breath. 3 Inhaler 1  . amoxicillin-clavulanate (AUGMENTIN) 875-125 MG tablet Take 1 tablet by mouth 2 (two) times daily. 20 tablet 0  . atorvastatin (LIPITOR) 20 MG tablet Take 1 tablet (20 mg total) by mouth daily. 90 tablet 3  . BESIVANCE 0.6 % SUSP Place 1 drop into both eyes 4 (four) times daily.   12  . Blood Glucose Monitoring Suppl (ACCU-CHEK AVIVA PLUS) W/DEVICE KIT 1 each by Does not apply route 4 (four) times daily -  with meals and at bedtime. 1 kit 0  . cadexomer iodine (IODOSORB) 0.9 % gel Apply 1 application topically daily as needed for wound care. 40 g 0  . capsaicin (ZOSTRIX) 0.025 % cream Apply 1 application topically daily as needed (for pain).    . Cholecalciferol (VITAMIN D3) 2000 UNITS capsule Take 1 capsule (2,000 Units total) by mouth daily. 90 capsule 0  . docusate sodium (COLACE) 100 MG capsule Take 1 capsule (100 mg total) by mouth 2 (two) times daily. (Patient taking differently: Take 200 mg by mouth daily as needed for mild constipation. ) 60 capsule 0  . ELIQUIS 5 MG TABS tablet TAKE 1 TABLET(5 MG) BY MOUTH TWICE  DAILY 60 tablet 2  . enalapril (VASOTEC) 20 MG tablet TAKE 2 TABLETS(40 MG) BY MOUTH DAILY 90 tablet 3  . furosemide (LASIX) 40 MG tablet Take 1 tablet (40 mg total) by mouth 2 (two) times daily. 60 tablet 3  . glucose blood (ACCU-CHEK AVIVA PLUS) test strip USE TO CHECK BLOOD SUGAR FOUR TIMES DAILY BEFORE MEALS AND AT BEDTIME 150 each 11  . HYDROcodone-acetaminophen (NORCO/VICODIN) 5-325 MG tablet Take 1 and 1/2 tablets by mouth  every 6 hours as needed for pain 140 tablet 0  . Insulin Glargine (TOUJEO SOLOSTAR) 300 UNIT/ML SOPN Inject 60-90 Units into the skin 2 (two) times daily. Injects 60 units in am and 90 units at night 9 pen 3  . Insulin Pen Needle 31G X 5 MM MISC Use for insulin injection 5 times a day. 150 each 5  . insulin regular human CONCENTRATED (HUMULIN R) 500 UNIT/ML kwikpen Inject 20 units at breakfast, 60 units at lunch and 70 units at dinner. 18 mL 3  . LUMIGAN 0.01 % SOLN Place 1 drop into both eyes at bedtime.     . metoprolol succinate (TOPROL-XL) 25 MG 24 hr tablet Take 1 tablet (25 mg total) by mouth daily. 90 tablet 3  . omeprazole (PRILOSEC) 20 MG capsule Take 1 capsule (20 mg total) by mouth daily. 90 capsule 0  . promethazine (PHENERGAN) 25 MG tablet TAKE 1/2 TO 1 TABLET BY MOUTH EVERY 6 HOURS AS NEEDED FOR NAUSEA 30 tablet 2  . silver sulfADIAZINE (SILVADENE) 1 % cream Apply 1 application topically daily. 50 g 0  . SIMBRINZA 1-0.2 % SUSP Place 1 drop into both eyes 2 (two) times daily. Take as instructed by Dr. Katy Fitch.    Marland Kitchen VICTOZA 18 MG/3ML SOPN Inject 1.8 mg once daily at the same time 9 mL 3   No current facility-administered medications on file prior to visit.   Allergies  Allergen Reactions  . Vancomycin     REACTION: ARF    Recent Results (from the past 2160 hour(s))  Glucose, capillary     Status: Abnormal   Collection Time: 09/17/15 11:22 AM  Result Value Ref Range   Glucose-Capillary 159 (H) 65 - 99 mg/dL  ToxAssure Select,+Antidepr,UR     Status: None   Collection Time: 09/17/15 11:41 AM  Result Value Ref Range   ToxASSURE Select (Plus) FINAL     Comment: ==================================================================== TOXASSURE SELECT,+ANTIDEPR,UR ==================================================================== Test                             Result       Flag       Units Drug Present   Hydrocodone                    72                      ng/mg creat    Hydromorphone                  148                     ng/mg creat   Norhydrocodone                 170                     ng/mg creat    Sources of hydrocodone  include scheduled prescription    medications. Hydromorphone and norhydrocodone are expected    metabolites of hydrocodone. Hydromorphone is also available as a    scheduled prescription medication. ==================================================================== Test                      Result    Flag   Units      Ref Range   Creatinine              81               mg/dL      >=20 ==================================================================== Declared Medications:  Medication list was not  provided. ==================================================================== For clinical consultation, please call [phone]. ====================================================================    PDF .   Glucose, random     Status: Abnormal   Collection Time: 10/10/15  1:50 PM  Result Value Ref Range   Glucose, Bld 215 (H) 70 - 99 mg/dL  Fructosamine     Status: None   Collection Time: 10/10/15  1:50 PM  Result Value Ref Range   Fructosamine 280 0 - 285 umol/L    Comment: Published reference interval for apparently healthy subjects between age 60 and 37 is 61 - 285 umol/L and in a poorly controlled diabetic population is 228 - 563 umol/L with a mean of 396 umol/L.   Wound culture     Status: None   Collection Time: 11/12/15  3:50 PM  Result Value Ref Range   Gram Stain Rare    Gram Stain WBC present-predominately Mononuclear    Gram Stain No Squamous Epithelial Cells Seen    Gram Stain Abundant Gram Positive Cocci In Pairs In Clusters    Organism ID, Bacteria Abundant GROUP B STREP (S.[email or web address]) ISOLATED     Comment: Beta hemolytic streptococci are predictably susceptible to penicillin and other beta-lactams. Susceptibility testing not routinely performed.     Objective: There were no vitals filed for  this visit.  General: Patient is awake, alert, oriented x 3 and in no acute distress.  Dermatology: Skin is warm and dry bilateral with a partial thickness ulceration present  Left plantar forefoot. Ulceration measures 3cm x 2cm x 0.3cm  (last measurement 4 cm x 2 cm x 0.3 cm) extending into skin crease at amp site. There is a mildly macerated and keratotic border with a granular base. The ulceration does not probe to bone. There is no malodor, no active drainage, no erythema, no edema. No other acute signs of infection.   Vascular: Dorsalis Pedis pulse = 0/4 Bilateral,  Posterior Tibial pulse = 1/4 Bilateral,  Capillary Fill Time < 5 seconds at remaining toes   Neurologic: Protective sensation absent bilateral using SWMF.  Musculosketal: No Pain with palpation to ulcerated area on left. No pain with compression to calves bilateral. 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  Assessment and Plan:  Problem List Items Addressed This Visit    None    Visit Diagnoses    Foot pain, left    -  Primary    Diabetic ulcer of left foot associated with type 2 diabetes mellitus ([place])        Foot amputation status, left (HCC)        Type 2 diabetes mellitus with diabetic neuropathy, unspecified long term insulin use status ([place])          -Examined patient and discussed the progression of the wound and treatment alternatives. - Excisionally dedbrided ulceration to healthy bleeding borders  using a sterile chisel blade.  -Applied Iodosorb and dry sterile dressing and instructed patient continue with home nursing 3x per week consisting of betadine and dry sterile dressing. -Dispensed post op shoe to use daily to left foot - Advised patient to go to the ER or return to office if the wound worsens or if constitutional symptoms are present. -Patient to return to office in 2-3 weeks for follow up care and evaluation or sooner if problems arise.  Landis Martins, DPM

## 2015-12-06 DIAGNOSIS — I129 Hypertensive chronic kidney disease with stage 1 through stage 4 chronic kidney disease, or unspecified chronic kidney disease: Secondary | ICD-10-CM | POA: Diagnosis not present

## 2015-12-06 DIAGNOSIS — L97421 Non-pressure chronic ulcer of left heel and midfoot limited to breakdown of skin: Secondary | ICD-10-CM | POA: Diagnosis not present

## 2015-12-06 DIAGNOSIS — E1122 Type 2 diabetes mellitus with diabetic chronic kidney disease: Secondary | ICD-10-CM | POA: Diagnosis not present

## 2015-12-06 DIAGNOSIS — G4733 Obstructive sleep apnea (adult) (pediatric): Secondary | ICD-10-CM | POA: Diagnosis not present

## 2015-12-06 DIAGNOSIS — E1142 Type 2 diabetes mellitus with diabetic polyneuropathy: Secondary | ICD-10-CM | POA: Diagnosis not present

## 2015-12-06 DIAGNOSIS — I480 Paroxysmal atrial fibrillation: Secondary | ICD-10-CM | POA: Diagnosis not present

## 2015-12-06 DIAGNOSIS — E11621 Type 2 diabetes mellitus with foot ulcer: Secondary | ICD-10-CM | POA: Diagnosis not present

## 2015-12-06 DIAGNOSIS — E1165 Type 2 diabetes mellitus with hyperglycemia: Secondary | ICD-10-CM | POA: Diagnosis not present

## 2015-12-06 DIAGNOSIS — G894 Chronic pain syndrome: Secondary | ICD-10-CM | POA: Diagnosis not present

## 2015-12-06 DIAGNOSIS — E11311 Type 2 diabetes mellitus with unspecified diabetic retinopathy with macular edema: Secondary | ICD-10-CM | POA: Diagnosis not present

## 2015-12-06 DIAGNOSIS — N183 Chronic kidney disease, stage 3 (moderate): Secondary | ICD-10-CM | POA: Diagnosis not present

## 2015-12-09 DIAGNOSIS — I480 Paroxysmal atrial fibrillation: Secondary | ICD-10-CM | POA: Diagnosis not present

## 2015-12-09 DIAGNOSIS — E11621 Type 2 diabetes mellitus with foot ulcer: Secondary | ICD-10-CM | POA: Diagnosis not present

## 2015-12-09 DIAGNOSIS — E11311 Type 2 diabetes mellitus with unspecified diabetic retinopathy with macular edema: Secondary | ICD-10-CM | POA: Diagnosis not present

## 2015-12-09 DIAGNOSIS — E1142 Type 2 diabetes mellitus with diabetic polyneuropathy: Secondary | ICD-10-CM | POA: Diagnosis not present

## 2015-12-09 DIAGNOSIS — E1122 Type 2 diabetes mellitus with diabetic chronic kidney disease: Secondary | ICD-10-CM | POA: Diagnosis not present

## 2015-12-09 DIAGNOSIS — E1165 Type 2 diabetes mellitus with hyperglycemia: Secondary | ICD-10-CM | POA: Diagnosis not present

## 2015-12-09 DIAGNOSIS — G4733 Obstructive sleep apnea (adult) (pediatric): Secondary | ICD-10-CM | POA: Diagnosis not present

## 2015-12-09 DIAGNOSIS — L97421 Non-pressure chronic ulcer of left heel and midfoot limited to breakdown of skin: Secondary | ICD-10-CM | POA: Diagnosis not present

## 2015-12-09 DIAGNOSIS — I129 Hypertensive chronic kidney disease with stage 1 through stage 4 chronic kidney disease, or unspecified chronic kidney disease: Secondary | ICD-10-CM | POA: Diagnosis not present

## 2015-12-09 DIAGNOSIS — G894 Chronic pain syndrome: Secondary | ICD-10-CM | POA: Diagnosis not present

## 2015-12-09 DIAGNOSIS — N183 Chronic kidney disease, stage 3 (moderate): Secondary | ICD-10-CM | POA: Diagnosis not present

## 2015-12-11 ENCOUNTER — Other Ambulatory Visit: Payer: Medicare Other

## 2015-12-11 DIAGNOSIS — L97421 Non-pressure chronic ulcer of left heel and midfoot limited to breakdown of skin: Secondary | ICD-10-CM | POA: Diagnosis not present

## 2015-12-11 DIAGNOSIS — N183 Chronic kidney disease, stage 3 (moderate): Secondary | ICD-10-CM | POA: Diagnosis not present

## 2015-12-11 DIAGNOSIS — E1122 Type 2 diabetes mellitus with diabetic chronic kidney disease: Secondary | ICD-10-CM | POA: Diagnosis not present

## 2015-12-11 DIAGNOSIS — G894 Chronic pain syndrome: Secondary | ICD-10-CM | POA: Diagnosis not present

## 2015-12-11 DIAGNOSIS — E11311 Type 2 diabetes mellitus with unspecified diabetic retinopathy with macular edema: Secondary | ICD-10-CM | POA: Diagnosis not present

## 2015-12-11 DIAGNOSIS — E1165 Type 2 diabetes mellitus with hyperglycemia: Secondary | ICD-10-CM | POA: Diagnosis not present

## 2015-12-11 DIAGNOSIS — I480 Paroxysmal atrial fibrillation: Secondary | ICD-10-CM | POA: Diagnosis not present

## 2015-12-11 DIAGNOSIS — E11621 Type 2 diabetes mellitus with foot ulcer: Secondary | ICD-10-CM | POA: Diagnosis not present

## 2015-12-11 DIAGNOSIS — G4733 Obstructive sleep apnea (adult) (pediatric): Secondary | ICD-10-CM | POA: Diagnosis not present

## 2015-12-11 DIAGNOSIS — I129 Hypertensive chronic kidney disease with stage 1 through stage 4 chronic kidney disease, or unspecified chronic kidney disease: Secondary | ICD-10-CM | POA: Diagnosis not present

## 2015-12-11 DIAGNOSIS — E1142 Type 2 diabetes mellitus with diabetic polyneuropathy: Secondary | ICD-10-CM | POA: Diagnosis not present

## 2015-12-12 ENCOUNTER — Other Ambulatory Visit (INDEPENDENT_AMBULATORY_CARE_PROVIDER_SITE_OTHER): Payer: Medicare Other

## 2015-12-12 DIAGNOSIS — Z794 Long term (current) use of insulin: Secondary | ICD-10-CM

## 2015-12-12 DIAGNOSIS — E1165 Type 2 diabetes mellitus with hyperglycemia: Secondary | ICD-10-CM

## 2015-12-12 LAB — COMPREHENSIVE METABOLIC PANEL
ALT: 23 U/L (ref 0–53)
AST: 22 U/L (ref 0–37)
Albumin: 3.8 g/dL (ref 3.5–5.2)
Alkaline Phosphatase: 132 U/L — ABNORMAL HIGH (ref 39–117)
BILIRUBIN TOTAL: 0.5 mg/dL (ref 0.2–1.2)
BUN: 25 mg/dL — AB (ref 6–23)
CO2: 28 meq/L (ref 19–32)
CREATININE: 1.77 mg/dL — AB (ref 0.40–1.50)
Calcium: 9.4 mg/dL (ref 8.4–10.5)
Chloride: 101 mEq/L (ref 96–112)
GFR: 49.78 mL/min — ABNORMAL LOW (ref >60.00)
GLUCOSE: 289 mg/dL — AB (ref 70–99)
Potassium: 4.2 mEq/L (ref 3.5–5.1)
Sodium: 134 mEq/L — ABNORMAL LOW (ref 135–145)
Total Protein: 7.3 g/dL (ref 6.0–8.3)

## 2015-12-12 LAB — HEMOGLOBIN A1C: HEMOGLOBIN A1C: 10 % — AB (ref 4.6–6.5)

## 2015-12-13 DIAGNOSIS — E1165 Type 2 diabetes mellitus with hyperglycemia: Secondary | ICD-10-CM | POA: Diagnosis not present

## 2015-12-13 DIAGNOSIS — N183 Chronic kidney disease, stage 3 (moderate): Secondary | ICD-10-CM | POA: Diagnosis not present

## 2015-12-13 DIAGNOSIS — G894 Chronic pain syndrome: Secondary | ICD-10-CM | POA: Diagnosis not present

## 2015-12-13 DIAGNOSIS — L97421 Non-pressure chronic ulcer of left heel and midfoot limited to breakdown of skin: Secondary | ICD-10-CM | POA: Diagnosis not present

## 2015-12-13 DIAGNOSIS — E1142 Type 2 diabetes mellitus with diabetic polyneuropathy: Secondary | ICD-10-CM | POA: Diagnosis not present

## 2015-12-13 DIAGNOSIS — I480 Paroxysmal atrial fibrillation: Secondary | ICD-10-CM | POA: Diagnosis not present

## 2015-12-13 DIAGNOSIS — E11311 Type 2 diabetes mellitus with unspecified diabetic retinopathy with macular edema: Secondary | ICD-10-CM | POA: Diagnosis not present

## 2015-12-13 DIAGNOSIS — E1122 Type 2 diabetes mellitus with diabetic chronic kidney disease: Secondary | ICD-10-CM | POA: Diagnosis not present

## 2015-12-13 DIAGNOSIS — G4733 Obstructive sleep apnea (adult) (pediatric): Secondary | ICD-10-CM | POA: Diagnosis not present

## 2015-12-13 DIAGNOSIS — I129 Hypertensive chronic kidney disease with stage 1 through stage 4 chronic kidney disease, or unspecified chronic kidney disease: Secondary | ICD-10-CM | POA: Diagnosis not present

## 2015-12-13 DIAGNOSIS — E11621 Type 2 diabetes mellitus with foot ulcer: Secondary | ICD-10-CM | POA: Diagnosis not present

## 2015-12-16 ENCOUNTER — Encounter: Payer: Self-pay | Admitting: Endocrinology

## 2015-12-16 ENCOUNTER — Ambulatory Visit (INDEPENDENT_AMBULATORY_CARE_PROVIDER_SITE_OTHER): Payer: Medicare Other | Admitting: Endocrinology

## 2015-12-16 VITALS — BP 124/68 | HR 84 | Temp 98.3°F | Resp 16 | Ht 78.0 in | Wt 378.8 lb

## 2015-12-16 DIAGNOSIS — E11621 Type 2 diabetes mellitus with foot ulcer: Secondary | ICD-10-CM | POA: Diagnosis not present

## 2015-12-16 DIAGNOSIS — E1122 Type 2 diabetes mellitus with diabetic chronic kidney disease: Secondary | ICD-10-CM | POA: Diagnosis not present

## 2015-12-16 DIAGNOSIS — E1142 Type 2 diabetes mellitus with diabetic polyneuropathy: Secondary | ICD-10-CM | POA: Diagnosis not present

## 2015-12-16 DIAGNOSIS — Z794 Long term (current) use of insulin: Secondary | ICD-10-CM

## 2015-12-16 DIAGNOSIS — E1165 Type 2 diabetes mellitus with hyperglycemia: Secondary | ICD-10-CM | POA: Diagnosis not present

## 2015-12-16 DIAGNOSIS — E11311 Type 2 diabetes mellitus with unspecified diabetic retinopathy with macular edema: Secondary | ICD-10-CM | POA: Diagnosis not present

## 2015-12-16 DIAGNOSIS — I480 Paroxysmal atrial fibrillation: Secondary | ICD-10-CM | POA: Diagnosis not present

## 2015-12-16 DIAGNOSIS — I129 Hypertensive chronic kidney disease with stage 1 through stage 4 chronic kidney disease, or unspecified chronic kidney disease: Secondary | ICD-10-CM | POA: Diagnosis not present

## 2015-12-16 DIAGNOSIS — L97421 Non-pressure chronic ulcer of left heel and midfoot limited to breakdown of skin: Secondary | ICD-10-CM | POA: Diagnosis not present

## 2015-12-16 DIAGNOSIS — G4733 Obstructive sleep apnea (adult) (pediatric): Secondary | ICD-10-CM | POA: Diagnosis not present

## 2015-12-16 DIAGNOSIS — N183 Chronic kidney disease, stage 3 (moderate): Secondary | ICD-10-CM | POA: Diagnosis not present

## 2015-12-16 DIAGNOSIS — G894 Chronic pain syndrome: Secondary | ICD-10-CM | POA: Diagnosis not present

## 2015-12-16 NOTE — Patient Instructions (Signed)
TOUJEO 100 in pm  More sugars after supper

## 2015-12-16 NOTE — Progress Notes (Signed)
Patient ID: [name], male   DOB: 02/03/1951, 65 y.o.   MRN: [id number]           Reason for Appointment: Follow-up for Type 2 Diabetes  Referring physician: Dareen Piano  History of Present Illness:          Date of diagnosis of type 2 diabetes mellitus: 2007       Background history:   He was diagnosed to have diabetes when he had an ulcer on his left third toe which led to amputation.  His glucose was apparently about 400 and he was started on insulin at that time He does not know if he has taken any oral hypoglycemic drugs in the past His A1c in 2016 has been consistently over 8%  Recent history:   INSULIN regimen is:  TOUJEO  60 in the morning and 90 in the evening HUMULIN U-500 insulin: 20 units in the morning, 60 at lunch and 70 at supper Non-insulin hypoglycemic drugs the patient is taking are: Victoza 1.8 mg daily       On his initial consultation was started on Victoza and subsequently started on U-500 insulin in 2/17 along with Toujeo  Blood sugars have been difficult to control and A1c is still high at 10%  Current blood sugar patterns and problems identified:  FASTING blood sugars are inconsistent and overall mostly high, he is mostly checking readings after 9-10 AM  He has only a couple of readings after his evening meal and recently are not consistently high  Also blood sugars are somewhat lower in the afternoons and as low as 73  Although he has lost some weight he still may not be benefiting fully from [place]  He was told to take his insulin 30 minutes before eating on the last visit and he is are always doing this  At least for the last month he has not been able to do much physical activity because of his foot ulcers  Some of his meals and snacks are still high fat  Side effects from medications have been: None  Compliance with the medical regimen: Fair  Hypoglycemia:   occasionally overnight as above  Glucose monitoring:  done up to 3  times  a day         Glucometer:  Accu-Chek     Blood Glucose readings by  Download as follows  Mean values apply above for all meters except median for One Touch  PRE-MEAL Fasting Lunch Dinner Bedtime Overall  Glucose range: 33-310  117-270  73-228  127-385    Mean/median: 220  160   190   Self-care: The diet that the patient has been following is: None    Typical meal intake: Breakfast is sometimes fruit like a banana, usually eating eggs,Sometimes meat like sausage, ham and grits at lunch.   Has snacks with Fritos, fruits  Dinner at 6-7 pm He drinks grapefruit or V-8 juice and diet drinks                Dietician consultation: 12/16               Exercise: not walking recently    Weight history: Previous range 260-410  Wt Readings from Last 3 Encounters:  12/16/15 378 lb 12.8 oz (171.823 kg)  10/15/15 382 lb 3.2 oz (173.365 kg)  09/17/15 392 lb 3.2 oz (177.901 kg)    Glycemic control:   Lab Results  Component Value Date   HGBA1C 10.0* 12/12/2015  HGBA1C 8.5* 08/09/2015   HGBA1C 8.0 06/04/2015   Lab Results  Component Value Date   MICROALBUR 89.3* 05/17/2015   LDLCALC 53 08/09/2015   CREATININE 1.77* 12/12/2015         Medication List       This list is accurate as of: 12/16/15  7:08 PM.  Always use your most recent med list.               ACCU-CHEK AVIVA PLUS w/Device Kit  1 each by Does not apply route 4 (four) times daily -  with meals and at bedtime.     ACCU-CHEK FASTCLIX LANCETS Misc  Check blood sugar 4 times a day before meals and bedtime dx code 250.02 insulin requiring     acetaminophen 500 MG tablet  Commonly known as:  TYLENOL  Take 1 tablet (500 mg total) by mouth every 4 (four) hours as needed. Maximum Daily Dosage of 2000 mg     albuterol 108 (90 Base) MCG/ACT inhaler  Commonly known as:  PROAIR HFA  Inhale 2 puffs into the lungs every 6 (six) hours as needed for wheezing or shortness of breath.     amoxicillin-clavulanate 875-125 MG  tablet  Commonly known as:  AUGMENTIN  Take 1 tablet by mouth 2 (two) times daily.     atorvastatin 20 MG tablet  Commonly known as:  LIPITOR  Take 1 tablet (20 mg total) by mouth daily.     BESIVANCE 0.6 % Susp  Generic drug:  Besifloxacin HCl  Place 1 drop into both eyes 4 (four) times daily.     cadexomer iodine 0.9 % gel  Commonly known as:  IODOSORB  Apply 1 application topically daily as needed for wound care.     capsaicin 0.025 % cream  Commonly known as:  ZOSTRIX  Apply 1 application topically daily as needed (for pain).     docusate sodium 100 MG capsule  Commonly known as:  COLACE  Take 1 capsule (100 mg total) by mouth 2 (two) times daily.     ELIQUIS 5 MG Tabs tablet  Generic drug:  apixaban  TAKE 1 TABLET(5 MG) BY MOUTH TWICE DAILY     enalapril 20 MG tablet  Commonly known as:  VASOTEC  TAKE 2 TABLETS(40 MG) BY MOUTH DAILY     furosemide 40 MG tablet  Commonly known as:  LASIX  Take 1 tablet (40 mg total) by mouth 2 (two) times daily.     glucose blood test strip  Commonly known as:  ACCU-CHEK AVIVA PLUS  USE TO CHECK BLOOD SUGAR FOUR TIMES DAILY BEFORE MEALS AND AT BEDTIME     HYDROcodone-acetaminophen 5-325 MG tablet  Commonly known as:  NORCO/VICODIN  Take 1 and 1/2 tablets by mouth every 6 hours as needed for pain     Insulin Glargine 300 UNIT/ML Sopn  Commonly known as:  TOUJEO SOLOSTAR  Inject 60-90 Units into the skin 2 (two) times daily. Injects 60 units in am and 90 units at night     Insulin Pen Needle 31G X 5 MM Misc  Use for insulin injection 5 times a day.     insulin regular human CONCENTRATED 500 UNIT/ML kwikpen  Commonly known as:  HUMULIN R  Inject 20 units at breakfast, 60 units at lunch and 70 units at dinner.     LUMIGAN 0.01 % Soln  Generic drug:  bimatoprost  Place 1 drop into both eyes at bedtime.     metoprolol  succinate 25 MG 24 hr tablet  Commonly known as:  TOPROL-XL  Take 1 tablet (25 mg total) by mouth daily.       omeprazole 20 MG capsule  Commonly known as:  PRILOSEC  Take 1 capsule (20 mg total) by mouth daily.     promethazine 25 MG tablet  Commonly known as:  PHENERGAN  TAKE 1/2 TO 1 TABLET BY MOUTH EVERY 6 HOURS AS NEEDED FOR NAUSEA     silver sulfADIAZINE 1 % cream  Commonly known as:  SILVADENE  Apply 1 application topically daily.     SIMBRINZA 1-0.2 % Susp  Generic drug:  Brinzolamide-Brimonidine  Place 1 drop into both eyes 2 (two) times daily. Take as instructed by Dr. Katy Fitch.     VICTOZA 18 MG/3ML Sopn  Generic drug:  Liraglutide  Inject 1.8 mg once daily at the same time     Vitamin D3 2000 units capsule  Take 1 capsule (2,000 Units total) by mouth daily.        Allergies:  Allergies  Allergen Reactions  . Vancomycin     REACTION: ARF    Past Medical History  Diagnosis Date  . Retinopathy   . Neuropathy, lower extremity   . Hyperlipidemia   . GERD (gastroesophageal reflux disease)   . Erectile dysfunction   . Osteomyelitis of ankle and foot ([place])   . Hemorrhoids   . HEARING LOSS, SENSORINEURAL, BILATERAL 01/03/2007    Seen by ENT Dr. Orpah Greek D. [place] Baseman 01/03/07  . Hypertension   . Asthma   . OSA on CPAP     Nocturnal polysomnogram on 01/21/2010 showed severe obstructive sleep apnea/hypopnea syndrome, AHI 74.1 per hour with non positional events, moderately loud snoring, and oxygen desaturation to a nadir of 78% on room air.  CPAP was successfully titrated to 17 CWP, AHI 1.1 per hour using a large ResMed Mirage Quattro full-face mask with heated humidifier. Bruxism was noted.   . Type II diabetes mellitus ([place])     w/complication NOS, type II  . Edema, macular, due to secondary diabetes ([place])   . DIABETIC FOOT ULCER 06/20/2009  . Arthritis     "elbows & knees" (12/13/2014)  . CKD (chronic kidney disease), stage III   . History of echocardiogram     a. 04/2008 Echo: EF 50-55%, abnl LV relaxation, mildly dil LA.  . Morbid obesity [facility])     Past Surgical History   Procedure Laterality Date  . Toe amputation Left 01/21/2006    S/P radical irrigation and debridement, left foot with third MTP joint amputation by Dr. Kathalene Frames. Mayer Camel.  . Amputation Left 12/15/2014    Procedure: LEFT SECOND TOE AMPUTATION ;  Surgeon: Dorna Leitz, MD;  Location: [facility];  Service: Orthopedics;  Laterality: Left;    Family History  Problem Relation Age of Onset  . Diabetes Mother     also 2 siblings  . Heart attack Father 30  . Throat cancer Brother     Social History:  reports that he quit smoking about 5 years ago. His smoking use included Cigarettes. He has a 15 pack-year smoking history. He has never used smokeless tobacco. He reports that he drinks about 6.6 oz of alcohol per week. He reports that he does not use illicit drugs.    Review of Systems    Lipid management: taking Lipitor, Still has low HDL    Lab Results  Component Value Date   CHOL 110 08/09/2015   HDL 27.40*  08/09/2015   LDLCALC 53 08/09/2015   TRIG 149.0 08/09/2015   CHOLHDL 4 08/09/2015            Hypertension: Has had hypertension for a few years treated with enalapril,  taking 40 mg in the morning Also on Lasix and low-dose metoprolol  Last foot exam was in 10/16    Physical Examination:  BP 124/68 mmHg  Pulse 84  Temp(Src) 98.3 F (36.8 C)  Resp 16  Ht '6\' 6"'$  (1.981 m)  Wt 378 lb 12.8 oz (171.823 kg)  BMI 43.78 kg/m2  SpO2 98%      ASSESSMENT:  Diabetes type 2, uncontrolled with morbid obesity  See history of present illness for detailed discussion of current diabetes management, blood sugar patterns and problems identified He is still quite insulin resistant and taking about 300 units of insulin a day Requiring large doses of both bolus and basal insulin  As discussed above his blood sugars are now mostly higher in the mornings although not clear which readings are 4 is eating Blood sugars are relatively better later in the day but again inconsistent Not clear if he  is getting good control of his sugars after his evening meal as he is monitoring and consistently Also taking Victoza, not clear how much she is benefiting although his weight is gradually coming down   HYPERTENSION:  controlled   PLAN:     Increase evening Toujeo by 10 units at least and more if fasting readings are higher  No change and new-500 insulin but takes consistently before meals about 30 minutes before eating  Follow instructions given by dietitian   Given information on low fat foods especially at breakfast and types of meat to use preferentially  More readings 2 hours after dinner  Regular walking for exercise when he can  Follow-up in 2 months   Patient Instructions  TOUJEO 100 in pm  More sugars after supper    Counseling time on subjects discussed above is over 50% of today's 25 minute visit    Stuart Mirabile 12/16/2015, 7:08 PM   Note: This office note was prepared with Dragon voice recognition system technology. Any transcriptional errors that result from this process are unintentional.

## 2015-12-18 DIAGNOSIS — E11311 Type 2 diabetes mellitus with unspecified diabetic retinopathy with macular edema: Secondary | ICD-10-CM | POA: Diagnosis not present

## 2015-12-18 DIAGNOSIS — I480 Paroxysmal atrial fibrillation: Secondary | ICD-10-CM | POA: Diagnosis not present

## 2015-12-18 DIAGNOSIS — N183 Chronic kidney disease, stage 3 (moderate): Secondary | ICD-10-CM | POA: Diagnosis not present

## 2015-12-18 DIAGNOSIS — I129 Hypertensive chronic kidney disease with stage 1 through stage 4 chronic kidney disease, or unspecified chronic kidney disease: Secondary | ICD-10-CM | POA: Diagnosis not present

## 2015-12-18 DIAGNOSIS — E11621 Type 2 diabetes mellitus with foot ulcer: Secondary | ICD-10-CM | POA: Diagnosis not present

## 2015-12-18 DIAGNOSIS — E1122 Type 2 diabetes mellitus with diabetic chronic kidney disease: Secondary | ICD-10-CM | POA: Diagnosis not present

## 2015-12-18 DIAGNOSIS — E1165 Type 2 diabetes mellitus with hyperglycemia: Secondary | ICD-10-CM | POA: Diagnosis not present

## 2015-12-18 DIAGNOSIS — G4733 Obstructive sleep apnea (adult) (pediatric): Secondary | ICD-10-CM | POA: Diagnosis not present

## 2015-12-18 DIAGNOSIS — E1142 Type 2 diabetes mellitus with diabetic polyneuropathy: Secondary | ICD-10-CM | POA: Diagnosis not present

## 2015-12-18 DIAGNOSIS — L97421 Non-pressure chronic ulcer of left heel and midfoot limited to breakdown of skin: Secondary | ICD-10-CM | POA: Diagnosis not present

## 2015-12-18 DIAGNOSIS — G894 Chronic pain syndrome: Secondary | ICD-10-CM | POA: Diagnosis not present

## 2015-12-20 DIAGNOSIS — G4733 Obstructive sleep apnea (adult) (pediatric): Secondary | ICD-10-CM | POA: Diagnosis not present

## 2015-12-20 DIAGNOSIS — E1122 Type 2 diabetes mellitus with diabetic chronic kidney disease: Secondary | ICD-10-CM | POA: Diagnosis not present

## 2015-12-20 DIAGNOSIS — L97421 Non-pressure chronic ulcer of left heel and midfoot limited to breakdown of skin: Secondary | ICD-10-CM | POA: Diagnosis not present

## 2015-12-20 DIAGNOSIS — G894 Chronic pain syndrome: Secondary | ICD-10-CM | POA: Diagnosis not present

## 2015-12-20 DIAGNOSIS — E11311 Type 2 diabetes mellitus with unspecified diabetic retinopathy with macular edema: Secondary | ICD-10-CM | POA: Diagnosis not present

## 2015-12-20 DIAGNOSIS — E11621 Type 2 diabetes mellitus with foot ulcer: Secondary | ICD-10-CM | POA: Diagnosis not present

## 2015-12-20 DIAGNOSIS — I129 Hypertensive chronic kidney disease with stage 1 through stage 4 chronic kidney disease, or unspecified chronic kidney disease: Secondary | ICD-10-CM | POA: Diagnosis not present

## 2015-12-20 DIAGNOSIS — E1165 Type 2 diabetes mellitus with hyperglycemia: Secondary | ICD-10-CM | POA: Diagnosis not present

## 2015-12-20 DIAGNOSIS — I480 Paroxysmal atrial fibrillation: Secondary | ICD-10-CM | POA: Diagnosis not present

## 2015-12-20 DIAGNOSIS — N183 Chronic kidney disease, stage 3 (moderate): Secondary | ICD-10-CM | POA: Diagnosis not present

## 2015-12-20 DIAGNOSIS — E1142 Type 2 diabetes mellitus with diabetic polyneuropathy: Secondary | ICD-10-CM | POA: Diagnosis not present

## 2015-12-23 ENCOUNTER — Ambulatory Visit: Payer: Medicare Other | Admitting: Sports Medicine

## 2015-12-24 ENCOUNTER — Encounter: Payer: Medicare Other | Admitting: Internal Medicine

## 2015-12-25 DIAGNOSIS — G894 Chronic pain syndrome: Secondary | ICD-10-CM | POA: Diagnosis not present

## 2015-12-25 DIAGNOSIS — L97421 Non-pressure chronic ulcer of left heel and midfoot limited to breakdown of skin: Secondary | ICD-10-CM | POA: Diagnosis not present

## 2015-12-25 DIAGNOSIS — E11621 Type 2 diabetes mellitus with foot ulcer: Secondary | ICD-10-CM | POA: Diagnosis not present

## 2015-12-25 DIAGNOSIS — E11311 Type 2 diabetes mellitus with unspecified diabetic retinopathy with macular edema: Secondary | ICD-10-CM | POA: Diagnosis not present

## 2015-12-25 DIAGNOSIS — I480 Paroxysmal atrial fibrillation: Secondary | ICD-10-CM | POA: Diagnosis not present

## 2015-12-25 DIAGNOSIS — E1165 Type 2 diabetes mellitus with hyperglycemia: Secondary | ICD-10-CM | POA: Diagnosis not present

## 2015-12-25 DIAGNOSIS — E1142 Type 2 diabetes mellitus with diabetic polyneuropathy: Secondary | ICD-10-CM | POA: Diagnosis not present

## 2015-12-25 DIAGNOSIS — N183 Chronic kidney disease, stage 3 (moderate): Secondary | ICD-10-CM | POA: Diagnosis not present

## 2015-12-25 DIAGNOSIS — E1122 Type 2 diabetes mellitus with diabetic chronic kidney disease: Secondary | ICD-10-CM | POA: Diagnosis not present

## 2015-12-25 DIAGNOSIS — G4733 Obstructive sleep apnea (adult) (pediatric): Secondary | ICD-10-CM | POA: Diagnosis not present

## 2015-12-25 DIAGNOSIS — I129 Hypertensive chronic kidney disease with stage 1 through stage 4 chronic kidney disease, or unspecified chronic kidney disease: Secondary | ICD-10-CM | POA: Diagnosis not present

## 2015-12-26 ENCOUNTER — Other Ambulatory Visit: Payer: Self-pay | Admitting: Internal Medicine

## 2015-12-27 DIAGNOSIS — L97421 Non-pressure chronic ulcer of left heel and midfoot limited to breakdown of skin: Secondary | ICD-10-CM | POA: Diagnosis not present

## 2015-12-27 DIAGNOSIS — E11621 Type 2 diabetes mellitus with foot ulcer: Secondary | ICD-10-CM | POA: Diagnosis not present

## 2015-12-27 DIAGNOSIS — N183 Chronic kidney disease, stage 3 (moderate): Secondary | ICD-10-CM | POA: Diagnosis not present

## 2015-12-27 DIAGNOSIS — I480 Paroxysmal atrial fibrillation: Secondary | ICD-10-CM | POA: Diagnosis not present

## 2015-12-27 DIAGNOSIS — G4733 Obstructive sleep apnea (adult) (pediatric): Secondary | ICD-10-CM | POA: Diagnosis not present

## 2015-12-27 DIAGNOSIS — E11311 Type 2 diabetes mellitus with unspecified diabetic retinopathy with macular edema: Secondary | ICD-10-CM | POA: Diagnosis not present

## 2015-12-27 DIAGNOSIS — E1142 Type 2 diabetes mellitus with diabetic polyneuropathy: Secondary | ICD-10-CM | POA: Diagnosis not present

## 2015-12-27 DIAGNOSIS — I129 Hypertensive chronic kidney disease with stage 1 through stage 4 chronic kidney disease, or unspecified chronic kidney disease: Secondary | ICD-10-CM | POA: Diagnosis not present

## 2015-12-27 DIAGNOSIS — G894 Chronic pain syndrome: Secondary | ICD-10-CM | POA: Diagnosis not present

## 2015-12-27 DIAGNOSIS — E1165 Type 2 diabetes mellitus with hyperglycemia: Secondary | ICD-10-CM | POA: Diagnosis not present

## 2015-12-27 DIAGNOSIS — E1122 Type 2 diabetes mellitus with diabetic chronic kidney disease: Secondary | ICD-10-CM | POA: Diagnosis not present

## 2015-12-31 ENCOUNTER — Telehealth: Payer: Self-pay | Admitting: Internal Medicine

## 2015-12-31 ENCOUNTER — Encounter: Payer: Self-pay | Admitting: Sports Medicine

## 2015-12-31 ENCOUNTER — Ambulatory Visit (INDEPENDENT_AMBULATORY_CARE_PROVIDER_SITE_OTHER): Payer: Medicare Other | Admitting: Sports Medicine

## 2015-12-31 DIAGNOSIS — M79672 Pain in left foot: Secondary | ICD-10-CM

## 2015-12-31 DIAGNOSIS — L97529 Non-pressure chronic ulcer of other part of left foot with unspecified severity: Secondary | ICD-10-CM

## 2015-12-31 DIAGNOSIS — E1142 Type 2 diabetes mellitus with diabetic polyneuropathy: Secondary | ICD-10-CM

## 2015-12-31 DIAGNOSIS — Z89432 Acquired absence of left foot: Secondary | ICD-10-CM

## 2015-12-31 DIAGNOSIS — IMO0002 Reserved for concepts with insufficient information to code with codable children: Secondary | ICD-10-CM

## 2015-12-31 DIAGNOSIS — E1140 Type 2 diabetes mellitus with diabetic neuropathy, unspecified: Secondary | ICD-10-CM

## 2015-12-31 DIAGNOSIS — L89891 Pressure ulcer of other site, stage 1: Secondary | ICD-10-CM

## 2015-12-31 DIAGNOSIS — E11621 Type 2 diabetes mellitus with foot ulcer: Secondary | ICD-10-CM

## 2015-12-31 MED ORDER — AMOXICILLIN-POT CLAVULANATE 875-125 MG PO TABS: 1.0000 | ORAL_TABLET | Freq: Two times a day (BID) | ORAL | Status: DC

## 2015-12-31 NOTE — Telephone Encounter (Signed)
Refill of HYDROcodone-acetaminophen (NORCO/VICODIN) 5-325 MG tablet

## 2015-12-31 NOTE — Progress Notes (Signed)
Patient ID: Zayn D Velie, male   DOB: 03/15/1951, 65 y.o.   MRN: [id number]  Subjective: Jaramiah D Dabbs is a 65 y.o. male patient seen in office for follow up evaluation of ulceration of the left ulceration. Patient has a history of diabetes and a blood glucose level today of 198mg/dl.   Patient states that he has nursing helping to change the dressing. Admits to swelling and increased walking with funeral he had to go to. Denies nausea/fever/vomiting/chills/night sweats/shortness of breath/pain. Patient has no other pedal complaints at this time.  Patient Active Problem List   Diagnosis Date Noted  . Chronic pain syndrome 09/18/2015  . Chronic fatigue 04/16/2015  . Preventative health care 03/12/2015  . Family history of coronary artery disease in father 12/14/2014  . Sleep apnea-on C-pap 12/14/2014  . Acute osteomyelitis of toe of left foot (HCC)   . Diabetic foot infection (HCC) 12/13/2014  . Foot ulcer, left (HCC) 12/13/2014  . Paroxysmal atrial fibrillation, new onset 12/13/2014  . Elevated alkaline phosphatase level 06/11/2014  . Constipation 05/31/2014  . Vitamin D deficiency 11/17/2013  . Frequent falls 06/30/2013  . Mild memory disturbance 07/27/2012  . Anemia 02/10/2012  . Hypertension 11/18/2011  . Obstructive sleep apnea 12/12/2009  . EDEMA 02/13/2009  . Callus of foot 12/13/2008  . GERD 06/27/2008  . Diabetes type 2, uncontrolled (HCC) 12/28/2007  . DIABETIC MACULAR EDEMA 10/07/2007  . Dyslipidemia 08/15/2007  . BACKGROUND DIABETIC RETINOPATHY 08/15/2007  . ERECTILE DYSFUNCTION 05/31/2007  . HEARING LOSS, SENSORINEURAL, BILATERAL 01/03/2007  . Diabetic peripheral neuropathy associated with type 2 diabetes mellitus (HCC) 12/16/2006  . Chronic kidney disease, stage III (moderate) 12/01/2006  . SKIN LESION 09/27/2006  . Obesity BMI 46 08/23/2006  . STATUS, OTHER TOE(S) AMPUTATION 05/21/2006   Current Outpatient Prescriptions on File Prior to Visit  Medication  Sig Dispense Refill  . ACCU-CHEK FASTCLIX LANCETS MISC Check blood sugar 4 times a day before meals and bedtime dx code 250.02 insulin requiring 102 each 6  . acetaminophen (TYLENOL) 500 MG tablet Take 1 tablet (500 mg total) by mouth every 4 (four) hours as needed. Maximum Daily Dosage of 2000 mg 100 tablet 0  . albuterol (PROAIR HFA) 108 (90 BASE) MCG/ACT inhaler Inhale 2 puffs into the lungs every 6 (six) hours as needed for wheezing or shortness of breath. 3 Inhaler 1  . atorvastatin (LIPITOR) 20 MG tablet Take 1 tablet (20 mg total) by mouth daily. 90 tablet 3  . BESIVANCE 0.6 % SUSP Place 1 drop into both eyes 4 (four) times daily.   12  . Blood Glucose Monitoring Suppl (ACCU-CHEK AVIVA PLUS) W/DEVICE KIT 1 each by Does not apply route 4 (four) times daily -  with meals and at bedtime. 1 kit 0  . cadexomer iodine (IODOSORB) 0.9 % gel Apply 1 application topically daily as needed for wound care. 40 g 0  . capsaicin (ZOSTRIX) 0.025 % cream Apply 1 application topically daily as needed (for pain).    . Cholecalciferol (VITAMIN D3) 2000 UNITS capsule Take 1 capsule (2,000 Units total) by mouth daily. 90 capsule 0  . docusate sodium (COLACE) 100 MG capsule Take 1 capsule (100 mg total) by mouth 2 (two) times daily. (Patient not taking: Reported on 12/16/2015) 60 capsule 0  . ELIQUIS 5 MG TABS tablet TAKE 1 TABLET(5 MG) BY MOUTH TWICE DAILY 180 tablet 1  . enalapril (VASOTEC) 20 MG tablet TAKE 2 TABLETS(40 MG) BY MOUTH DAILY 90 tablet 3  .   furosemide (LASIX) 40 MG tablet Take 1 tablet (40 mg total) by mouth 2 (two) times daily. 60 tablet 3  . glucose blood (ACCU-CHEK AVIVA PLUS) test strip USE TO CHECK BLOOD SUGAR FOUR TIMES DAILY BEFORE MEALS AND AT BEDTIME 150 each 11  . HYDROcodone-acetaminophen (NORCO/VICODIN) 5-325 MG tablet Take 1 and 1/2 tablets by mouth every 6 hours as needed for pain 140 tablet 0  . Insulin Glargine (TOUJEO SOLOSTAR) 300 UNIT/ML SOPN Inject 60-90 Units into the skin 2 (two)  times daily. Injects 60 units in am and 90 units at night 9 pen 3  . Insulin Pen Needle 31G X 5 MM MISC Use for insulin injection 5 times a day. 150 each 5  . insulin regular human CONCENTRATED (HUMULIN R) 500 UNIT/ML kwikpen Inject 20 units at breakfast, 60 units at lunch and 70 units at dinner. 18 mL 3  . LUMIGAN 0.01 % SOLN Place 1 drop into both eyes at bedtime.     . metoprolol succinate (TOPROL-XL) 25 MG 24 hr tablet Take 1 tablet (25 mg total) by mouth daily. 90 tablet 3  . omeprazole (PRILOSEC) 20 MG capsule Take 1 capsule (20 mg total) by mouth daily. 90 capsule 0  . promethazine (PHENERGAN) 25 MG tablet TAKE 1/2 TO 1 TABLET BY MOUTH EVERY 6 HOURS AS NEEDED FOR NAUSEA 30 tablet 0  . silver sulfADIAZINE (SILVADENE) 1 % cream Apply 1 application topically daily. 50 g 0  . SIMBRINZA 1-0.2 % SUSP Place 1 drop into both eyes 2 (two) times daily. Take as instructed by Dr. Katy Fitch.    [place] Kitchen VICTOZA 18 MG/3ML SOPN Inject 1.8 mg once daily at the same time 9 mL 3   No current facility-administered medications on file prior to visit.   Allergies  Allergen Reactions  . Vancomycin     REACTION: ARF    Recent Results (from the past 2160 hour(s))  Glucose, random     Status: Abnormal   Collection Time: 10/10/15  1:50 PM  Result Value Ref Range   Glucose, Bld 215 (H) 70 - 99 mg/dL  Fructosamine     Status: None   Collection Time: 10/10/15  1:50 PM  Result Value Ref Range   Fructosamine 280 0 - 285 umol/L    Comment: Published reference interval for apparently healthy subjects between age 51 and 40 is 71 - 285 umol/L and in a poorly controlled diabetic population is 228 - 563 umol/L with a mean of 396 umol/L.   Wound culture     Status: None   Collection Time: 11/12/15  3:50 PM  Result Value Ref Range   Gram Stain Rare    Gram Stain WBC present-predominately Mononuclear    Gram Stain No Squamous Epithelial Cells Seen    Gram Stain Abundant Gram Positive Cocci In Pairs In Clusters     Organism ID, Bacteria Abundant GROUP B STREP (S.AGALACTIAE) ISOLATED     Comment: Beta hemolytic streptococci are predictably susceptible to penicillin and other beta-lactams. Susceptibility testing not routinely performed.   Hemoglobin A1c     Status: Abnormal   Collection Time: 12/12/15 12:19 PM  Result Value Ref Range   Hgb A1c MFr Bld 10.0 (H) 4.6 - 6.5 %    Comment: Glycemic Control Guidelines for People with Diabetes:Non Diabetic:  <6%Goal of Therapy: <7%Additional Action Suggested:  >8%   Comprehensive metabolic panel     Status: Abnormal   Collection Time: 12/12/15 12:19 PM  Result Value Ref Range  Sodium 134 (L) 135 - 145 mEq/L   Potassium 4.2 3.5 - 5.1 mEq/L   Chloride 101 96 - 112 mEq/L   CO2 28 19 - 32 mEq/L   Glucose, Bld 289 (H) 70 - 99 mg/dL   BUN 25 (H) 6 - 23 mg/dL   Creatinine, Ser 1.77 (H) 0.40 - 1.50 mg/dL   Total Bilirubin 0.5 0.2 - 1.2 mg/dL   Alkaline Phosphatase 132 (H) 39 - 117 U/L   AST 22 0 - 37 U/L   ALT 23 0 - 53 U/L   Total Protein 7.3 6.0 - 8.3 g/dL   Albumin 3.8 3.5 - 5.2 g/dL   Calcium 9.4 8.4 - 10.5 mg/dL   GFR 49.78 (L) >60.00 mL/min    Objective: There were no vitals filed for this visit.  General: Patient is awake, alert, oriented x 3 and in no acute distress.  Dermatology: Skin is warm and dry bilateral with a partial thickness ulceration present  Left plantar forefoot. Ulceration measures 1cm x 1.5cm x 0.3cm  (last measurement 3 cm x 2 cm x 0.3 cm) extending into skin crease at amp site. There is a decreased macerated and mildly keratotic border with a granular base. The ulceration does not probe to bone. There is no malodor, no active drainage, no erythema, + swelling and warmth to dorsum of foot. No other acute signs of infection.   Vascular: Dorsalis Pedis pulse = 0/4 Bilateral,  Posterior Tibial pulse = 1/4 Bilateral,  Capillary Fill Time < 5 seconds at remaining toes   Neurologic: Protective sensation absent bilateral using  SWMF.  Musculosketal: No Pain with palpation to ulcerated area on left. No pain with compression to calves bilateral. 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  Assessment and Plan:  Problem List Items Addressed This Visit    None    Visit Diagnoses    Foot pain, left    -  Primary    Relevant Medications    amoxicillin-clavulanate (AUGMENTIN) 875-125 MG tablet    Diabetic ulcer of left foot associated with type 2 diabetes mellitus (HCC)        Relevant Medications    amoxicillin-clavulanate (AUGMENTIN) 875-125 MG tablet    Foot amputation status, left (HCC)        Type 2 diabetes mellitus with diabetic neuropathy, unspecified long term insulin use status (HCC)          -Examined patient and discussed the progression of the wound and treatment alternatives. - Excisionally dedbrided ulceration to healthy bleeding borders using a sterile chisel blade.  -Applied Iodosorb and dry sterile dressing and instructed patient continue with home nursing 3x per week consisting of betadine and dry sterile dressing. -Rx Augmentin for preventative measures since foot is warm and mildly swollen -Applied offloading pad to post op shoe to use daily to left foot -Advised patient to refrain from getting the ulceration wet and to limit activity to necessity - Advised patient to go to the ER or return to office if the wound worsens or if constitutional symptoms are present. -Patient to return to office in 3 weeks for follow up care and evaluation or sooner if problems arise.  Titorya Stover, DPM  

## 2016-01-01 ENCOUNTER — Encounter: Payer: Self-pay | Admitting: Internal Medicine

## 2016-01-01 ENCOUNTER — Ambulatory Visit (INDEPENDENT_AMBULATORY_CARE_PROVIDER_SITE_OTHER): Payer: Medicare Other | Admitting: Internal Medicine

## 2016-01-01 DIAGNOSIS — G4733 Obstructive sleep apnea (adult) (pediatric): Secondary | ICD-10-CM | POA: Diagnosis not present

## 2016-01-01 DIAGNOSIS — E11311 Type 2 diabetes mellitus with unspecified diabetic retinopathy with macular edema: Secondary | ICD-10-CM | POA: Diagnosis not present

## 2016-01-01 DIAGNOSIS — G894 Chronic pain syndrome: Secondary | ICD-10-CM

## 2016-01-01 DIAGNOSIS — N183 Chronic kidney disease, stage 3 (moderate): Secondary | ICD-10-CM | POA: Diagnosis not present

## 2016-01-01 DIAGNOSIS — E11621 Type 2 diabetes mellitus with foot ulcer: Secondary | ICD-10-CM | POA: Diagnosis not present

## 2016-01-01 DIAGNOSIS — Z794 Long term (current) use of insulin: Secondary | ICD-10-CM

## 2016-01-01 DIAGNOSIS — I10 Essential (primary) hypertension: Secondary | ICD-10-CM | POA: Diagnosis not present

## 2016-01-01 DIAGNOSIS — E1122 Type 2 diabetes mellitus with diabetic chronic kidney disease: Secondary | ICD-10-CM

## 2016-01-01 DIAGNOSIS — L97421 Non-pressure chronic ulcer of left heel and midfoot limited to breakdown of skin: Secondary | ICD-10-CM | POA: Diagnosis not present

## 2016-01-01 DIAGNOSIS — Z79891 Long term (current) use of opiate analgesic: Secondary | ICD-10-CM | POA: Diagnosis not present

## 2016-01-01 DIAGNOSIS — E1142 Type 2 diabetes mellitus with diabetic polyneuropathy: Secondary | ICD-10-CM | POA: Diagnosis not present

## 2016-01-01 DIAGNOSIS — E1165 Type 2 diabetes mellitus with hyperglycemia: Secondary | ICD-10-CM | POA: Diagnosis not present

## 2016-01-01 DIAGNOSIS — I480 Paroxysmal atrial fibrillation: Secondary | ICD-10-CM | POA: Diagnosis not present

## 2016-01-01 DIAGNOSIS — I129 Hypertensive chronic kidney disease with stage 1 through stage 4 chronic kidney disease, or unspecified chronic kidney disease: Secondary | ICD-10-CM | POA: Diagnosis not present

## 2016-01-01 DIAGNOSIS — IMO0002 Reserved for concepts with insufficient information to code with codable children: Secondary | ICD-10-CM

## 2016-01-01 MED ORDER — HYDROCODONE-ACETAMINOPHEN 5-325 MG PO TABS: ORAL_TABLET | ORAL | Status: DC

## 2016-01-01 NOTE — Patient Instructions (Signed)
General Instructions: - Continue walking and eating healthy for weight loss- you are doing a great job! - Please follow up with Dr. Dwyane Dee for your diabetes. We need to work on getting your diabetes under better control. - Follow up in 1 month to discuss your pain medications  Please bring your medicines with you each time you come to clinic.  Medicines may include prescription medications, over-the-counter medications, herbal remedies, eye drops, vitamins, or other pills.   Progress Toward Treatment Goals:  Treatment Goal 10/08/2014  Hemoglobin A1C unchanged  Blood pressure at goal    Self Care Goals & Plans:  Self Care Goal 09/17/2015  Manage my medications take my medicines as prescribed; bring my medications to every visit; refill my medications on time  Monitor my health keep track of my blood glucose; bring my glucose meter and log to each visit  Eat healthy foods drink diet soda or water instead of juice or soda; eat more vegetables; eat foods that are low in salt; eat baked foods instead of fried foods; eat fruit for snacks and desserts  Be physically active find an activity I enjoy  Meeting treatment goals maintain the current self-care plan    Home Blood Glucose Monitoring 10/08/2014  Check my blood sugar 4 times a day  When to check my blood sugar before meals; at bedtime     Care Management & Community Referrals:  Referral 10/08/2014  Referrals made for care management support diabetes educator  Referrals made to community resources none

## 2016-01-02 NOTE — Assessment & Plan Note (Signed)
Patient reports his pain is due to peripheral neuropathy, yet he has been on opioid treatment for many years for this problem. Would consider trying Gabapentin or Lyrica to see if this could help his pain instead of opioids. We discussed that he cannot use illicit substances while taking Norco and he understands. I gave him one prescription for Norco 5-325 mg 1.5 tablets Q6H PRN #140 tablets. Will obtain UDS today. Note that his narcotic tablet amount was cut down by more than 10%, may consider giving him a few more tablets so it is not such a drastic change. Will defer to PCP.

## 2016-01-02 NOTE — Assessment & Plan Note (Signed)
BP well controlled today. I encouraged him to lose more weight as this will help both his hypertension and diabetes. Continue current regimen.

## 2016-01-02 NOTE — Progress Notes (Signed)
   Subjective:    Patient ID: [name], male    DOB: [birth year], 65 y.o.   MRN: [id number]  HPI [name] is a 65yo man with PMHx of HTN, paroxysmal AFib, type 2 DM, and CKD stage 3 who presents today for follow up of his hypertension.  HTN: BP 130/75 today. He is taking Enalapril 40 mg daily, Lasix 40 mg BID, and Toprol-XL 25 mg daily. He reports he has lost some weight by eating healthier. He has lost 8 lbs in the last 2 months.   Type 2 DM: Last A1c 10.0. He follows with Dr. Dwyane Dee (endocrinology) for his diabetes. He reports his blood sugars have been high because he keeps forgetting to take his pre-meal insulin. He states he eats and then checks his blood sugar a few hours later and it will be high. He will take his insulin at this point. He is supposed to be taking Toujeo 60 units in the morning and 90 units at night, Humulin R U-500 concentrated 20 units at breakfast, 60 units at lunch, and 70 units at dinner, and Victoza 1.8 mg daily. Per review of his glucometer, AM blood sugars mostly in 220-260 range, lunch time 110s-150s, dinner 90s-180s, and bedtime 220-400.   Chronic Pain Syndrome: Reports he has pain in his feet and legs which has been ongoing for many years. When I ask why he has this pain he states he has peripheral neuropathy and that he also has pain from left foot toe amputations. He reports taking Norco 5-325 mg 2 pills BID. His last dose was on 6/20. His UDS on 07/02/15 was inappropriate for alcohol and cocaine, and hydrocodone was absent. His UDS on 09/17/15 was appropriate for hydrocodone. He understands that he cannot use illicit substances while taking Norco. His pain medication amount was decreased from #180 tablets to #140 tablets at his last visit.    Review of Systems General: Denies fever, chills, night sweats, changes in appetite HEENT: Denies headaches, ear pain, changes in vision, rhinorrhea, sore throat CV: Denies CP, palpitations, SOB, orthopnea Pulm:  Denies SOB, cough, wheezing GI: Denies abdominal pain, nausea, vomiting, diarrhea, constipation, melena, hematochezia GU: Denies dysuria, hematuria, frequency Msk: Denies muscle cramps Neuro: Denies weakness Skin: Denies rashes, bruising Psych: Denies depression, anxiety, hallucinations    Objective:   Physical Exam General: well-developed, well-nourished, sitting up, NAD  HEENT: [place]/AT, EOMI, sclera anicteric, mucus membranes moist CV: irregularly irregular, no m/g/r Pulm: CTA bilaterally, breaths non-labored Abd: BS+, soft, obese, non-tender Ext: warm, 1+ pitting edema in left lower extremity, trace edema in right lower extremity. His left foot is wrapped in gauze and ace bandage with boot. No drainage appreciated.  Neuro: alert and oriented x 3    Assessment & Plan:  Please refer to A&P documentation.

## 2016-01-02 NOTE — Assessment & Plan Note (Signed)
Blood sugars remain high overall. Advised patient to set a reminder on his phone or leave a note where he can see it (on the fridge) to remind him to take his pre-meal insulin. Patient to see Dr. Dwyane Dee again in August.

## 2016-01-03 DIAGNOSIS — E1165 Type 2 diabetes mellitus with hyperglycemia: Secondary | ICD-10-CM | POA: Diagnosis not present

## 2016-01-03 DIAGNOSIS — G4733 Obstructive sleep apnea (adult) (pediatric): Secondary | ICD-10-CM | POA: Diagnosis not present

## 2016-01-03 DIAGNOSIS — E1142 Type 2 diabetes mellitus with diabetic polyneuropathy: Secondary | ICD-10-CM | POA: Diagnosis not present

## 2016-01-03 DIAGNOSIS — N183 Chronic kidney disease, stage 3 (moderate): Secondary | ICD-10-CM | POA: Diagnosis not present

## 2016-01-03 DIAGNOSIS — G894 Chronic pain syndrome: Secondary | ICD-10-CM | POA: Diagnosis not present

## 2016-01-03 DIAGNOSIS — I480 Paroxysmal atrial fibrillation: Secondary | ICD-10-CM | POA: Diagnosis not present

## 2016-01-03 DIAGNOSIS — E11621 Type 2 diabetes mellitus with foot ulcer: Secondary | ICD-10-CM | POA: Diagnosis not present

## 2016-01-03 DIAGNOSIS — I129 Hypertensive chronic kidney disease with stage 1 through stage 4 chronic kidney disease, or unspecified chronic kidney disease: Secondary | ICD-10-CM | POA: Diagnosis not present

## 2016-01-03 DIAGNOSIS — E1122 Type 2 diabetes mellitus with diabetic chronic kidney disease: Secondary | ICD-10-CM | POA: Diagnosis not present

## 2016-01-03 DIAGNOSIS — L97421 Non-pressure chronic ulcer of left heel and midfoot limited to breakdown of skin: Secondary | ICD-10-CM | POA: Diagnosis not present

## 2016-01-03 DIAGNOSIS — E11311 Type 2 diabetes mellitus with unspecified diabetic retinopathy with macular edema: Secondary | ICD-10-CM | POA: Diagnosis not present

## 2016-01-03 NOTE — Progress Notes (Signed)
[facility] Medicine Clinic Attending  Case discussed with Dr. Rivet soon after the resident saw the patient.  We reviewed the resident's history and exam and pertinent patient test results.  I agree with the assessment, diagnosis, and plan of care documented in the resident's note.  

## 2016-01-06 DIAGNOSIS — L97421 Non-pressure chronic ulcer of left heel and midfoot limited to breakdown of skin: Secondary | ICD-10-CM | POA: Diagnosis not present

## 2016-01-06 DIAGNOSIS — N183 Chronic kidney disease, stage 3 (moderate): Secondary | ICD-10-CM | POA: Diagnosis not present

## 2016-01-06 DIAGNOSIS — E1122 Type 2 diabetes mellitus with diabetic chronic kidney disease: Secondary | ICD-10-CM | POA: Diagnosis not present

## 2016-01-06 DIAGNOSIS — E11621 Type 2 diabetes mellitus with foot ulcer: Secondary | ICD-10-CM | POA: Diagnosis not present

## 2016-01-06 DIAGNOSIS — E1165 Type 2 diabetes mellitus with hyperglycemia: Secondary | ICD-10-CM | POA: Diagnosis not present

## 2016-01-06 DIAGNOSIS — E11311 Type 2 diabetes mellitus with unspecified diabetic retinopathy with macular edema: Secondary | ICD-10-CM | POA: Diagnosis not present

## 2016-01-06 DIAGNOSIS — I480 Paroxysmal atrial fibrillation: Secondary | ICD-10-CM | POA: Diagnosis not present

## 2016-01-06 DIAGNOSIS — G4733 Obstructive sleep apnea (adult) (pediatric): Secondary | ICD-10-CM | POA: Diagnosis not present

## 2016-01-06 DIAGNOSIS — E1142 Type 2 diabetes mellitus with diabetic polyneuropathy: Secondary | ICD-10-CM | POA: Diagnosis not present

## 2016-01-06 DIAGNOSIS — G894 Chronic pain syndrome: Secondary | ICD-10-CM | POA: Diagnosis not present

## 2016-01-06 DIAGNOSIS — I129 Hypertensive chronic kidney disease with stage 1 through stage 4 chronic kidney disease, or unspecified chronic kidney disease: Secondary | ICD-10-CM | POA: Diagnosis not present

## 2016-01-06 NOTE — Telephone Encounter (Signed)
Done 6/21

## 2016-01-08 ENCOUNTER — Telehealth: Payer: Self-pay

## 2016-01-08 DIAGNOSIS — I129 Hypertensive chronic kidney disease with stage 1 through stage 4 chronic kidney disease, or unspecified chronic kidney disease: Secondary | ICD-10-CM | POA: Diagnosis not present

## 2016-01-08 DIAGNOSIS — N183 Chronic kidney disease, stage 3 (moderate): Secondary | ICD-10-CM | POA: Diagnosis not present

## 2016-01-08 DIAGNOSIS — G894 Chronic pain syndrome: Secondary | ICD-10-CM | POA: Diagnosis not present

## 2016-01-08 DIAGNOSIS — E11311 Type 2 diabetes mellitus with unspecified diabetic retinopathy with macular edema: Secondary | ICD-10-CM | POA: Diagnosis not present

## 2016-01-08 DIAGNOSIS — E11621 Type 2 diabetes mellitus with foot ulcer: Secondary | ICD-10-CM | POA: Diagnosis not present

## 2016-01-08 DIAGNOSIS — G4733 Obstructive sleep apnea (adult) (pediatric): Secondary | ICD-10-CM | POA: Diagnosis not present

## 2016-01-08 DIAGNOSIS — I480 Paroxysmal atrial fibrillation: Secondary | ICD-10-CM | POA: Diagnosis not present

## 2016-01-08 DIAGNOSIS — L97421 Non-pressure chronic ulcer of left heel and midfoot limited to breakdown of skin: Secondary | ICD-10-CM | POA: Diagnosis not present

## 2016-01-08 DIAGNOSIS — E1122 Type 2 diabetes mellitus with diabetic chronic kidney disease: Secondary | ICD-10-CM | POA: Diagnosis not present

## 2016-01-08 DIAGNOSIS — E1142 Type 2 diabetes mellitus with diabetic polyneuropathy: Secondary | ICD-10-CM | POA: Diagnosis not present

## 2016-01-08 DIAGNOSIS — E1165 Type 2 diabetes mellitus with hyperglycemia: Secondary | ICD-10-CM | POA: Diagnosis not present

## 2016-01-08 NOTE — Telephone Encounter (Signed)
[name] is a 65 y.o. male who was contacted via telephone for monitoring of apixaban (Eliquis) therapy.    ASSESSMENT Indication(s): atrial fibrillation Duration: indefinite  Labs:    Component Value Date/Time   AST 22 12/12/2015 1219   ALT 23 12/12/2015 1219   NA 134* 12/12/2015 1219   K 4.2 12/12/2015 1219   CL 101 12/12/2015 1219   CO2 28 12/12/2015 1219   GLUCOSE 289* 12/12/2015 1219   HGBA1C 10.0* 12/12/2015 1219   HGBA1C 8.0 06/04/2015 1123   BUN 25* 12/12/2015 1219   CREATININE 1.77* 12/12/2015 1219   CREATININE 1.61* 12/20/2014 1133   CALCIUM 9.4 12/12/2015 1219   GFRNONAA 45* 12/20/2014 1133   GFRNONAA 36* 12/16/2014 0345   GFRAA 51* 12/20/2014 1133   GFRAA 42* 12/16/2014 0345   WBC 3.9 04/16/2015 1037   WBC 7.0 12/16/2014 0345   HGB 11.1* 12/16/2014 0345   HCT 36.0* 04/16/2015 1037   HCT 35.2* 12/16/2014 0345   PLT 136* 04/16/2015 1037   PLT 190 12/16/2014 0345    apixaban (Eliquis) Dose: 5mg  bid  Safety: Patient has not had recent bleeding/thromboembolic events. Patient reports no recent signs or symptoms of bleeding, no signs of symptoms of thromboembolism. Medication changes: no.  Adherence: Patient reports no known adherence challenges. Patient does correctly recite the dose. Contacted pharmacy and records indicate refills are consistent.  Patient Instructions: Patient advised to contact clinic or seek medical attention if signs/symptoms of bleeding or thromboembolism occur. Patient verbalized understanding by repeating back information.  Follow-up . No appt made.  Juanell Fairly PharmD Candidate  01/08/2016, 1:19 PM

## 2016-01-09 ENCOUNTER — Telehealth: Payer: Self-pay | Admitting: Internal Medicine

## 2016-01-09 MED ORDER — ALBUTEROL SULFATE HFA 108 (90 BASE) MCG/ACT IN AERS: 2.0000 | INHALATION_SPRAY | Freq: Four times a day (QID) | RESPIRATORY_TRACT | Status: DC | PRN

## 2016-01-09 NOTE — Telephone Encounter (Signed)
albuterol (PROAIR HFA) 108 (90 BASE) MCG/ACT inhaler [facility]

## 2016-01-09 NOTE — Telephone Encounter (Signed)
Patient was contacted  by Tanya Makhlouf, PharmD candidate. I agree with the assessment and plan of care documented. 

## 2016-01-10 DIAGNOSIS — E1122 Type 2 diabetes mellitus with diabetic chronic kidney disease: Secondary | ICD-10-CM | POA: Diagnosis not present

## 2016-01-10 DIAGNOSIS — G4733 Obstructive sleep apnea (adult) (pediatric): Secondary | ICD-10-CM | POA: Diagnosis not present

## 2016-01-10 DIAGNOSIS — E1165 Type 2 diabetes mellitus with hyperglycemia: Secondary | ICD-10-CM | POA: Diagnosis not present

## 2016-01-10 DIAGNOSIS — E1142 Type 2 diabetes mellitus with diabetic polyneuropathy: Secondary | ICD-10-CM | POA: Diagnosis not present

## 2016-01-10 DIAGNOSIS — I480 Paroxysmal atrial fibrillation: Secondary | ICD-10-CM | POA: Diagnosis not present

## 2016-01-10 DIAGNOSIS — G894 Chronic pain syndrome: Secondary | ICD-10-CM | POA: Diagnosis not present

## 2016-01-10 DIAGNOSIS — L97421 Non-pressure chronic ulcer of left heel and midfoot limited to breakdown of skin: Secondary | ICD-10-CM | POA: Diagnosis not present

## 2016-01-10 DIAGNOSIS — E11621 Type 2 diabetes mellitus with foot ulcer: Secondary | ICD-10-CM | POA: Diagnosis not present

## 2016-01-10 DIAGNOSIS — E11311 Type 2 diabetes mellitus with unspecified diabetic retinopathy with macular edema: Secondary | ICD-10-CM | POA: Diagnosis not present

## 2016-01-10 DIAGNOSIS — I129 Hypertensive chronic kidney disease with stage 1 through stage 4 chronic kidney disease, or unspecified chronic kidney disease: Secondary | ICD-10-CM | POA: Diagnosis not present

## 2016-01-10 DIAGNOSIS — N183 Chronic kidney disease, stage 3 (moderate): Secondary | ICD-10-CM | POA: Diagnosis not present

## 2016-01-10 LAB — TOXASSURE SELECT,+ANTIDEPR,UR: PDF: 0

## 2016-01-13 DIAGNOSIS — G894 Chronic pain syndrome: Secondary | ICD-10-CM | POA: Diagnosis not present

## 2016-01-13 DIAGNOSIS — L97421 Non-pressure chronic ulcer of left heel and midfoot limited to breakdown of skin: Secondary | ICD-10-CM | POA: Diagnosis not present

## 2016-01-13 DIAGNOSIS — E1122 Type 2 diabetes mellitus with diabetic chronic kidney disease: Secondary | ICD-10-CM | POA: Diagnosis not present

## 2016-01-13 DIAGNOSIS — E1165 Type 2 diabetes mellitus with hyperglycemia: Secondary | ICD-10-CM | POA: Diagnosis not present

## 2016-01-13 DIAGNOSIS — E11311 Type 2 diabetes mellitus with unspecified diabetic retinopathy with macular edema: Secondary | ICD-10-CM | POA: Diagnosis not present

## 2016-01-13 DIAGNOSIS — N183 Chronic kidney disease, stage 3 (moderate): Secondary | ICD-10-CM | POA: Diagnosis not present

## 2016-01-13 DIAGNOSIS — E1142 Type 2 diabetes mellitus with diabetic polyneuropathy: Secondary | ICD-10-CM | POA: Diagnosis not present

## 2016-01-13 DIAGNOSIS — E11621 Type 2 diabetes mellitus with foot ulcer: Secondary | ICD-10-CM | POA: Diagnosis not present

## 2016-01-13 DIAGNOSIS — I480 Paroxysmal atrial fibrillation: Secondary | ICD-10-CM | POA: Diagnosis not present

## 2016-01-13 DIAGNOSIS — Z89422 Acquired absence of other left toe(s): Secondary | ICD-10-CM | POA: Diagnosis not present

## 2016-01-13 DIAGNOSIS — I129 Hypertensive chronic kidney disease with stage 1 through stage 4 chronic kidney disease, or unspecified chronic kidney disease: Secondary | ICD-10-CM | POA: Diagnosis not present

## 2016-01-16 ENCOUNTER — Telehealth: Payer: Self-pay | Admitting: *Deleted

## 2016-01-16 ENCOUNTER — Telehealth: Payer: Self-pay | Admitting: Endocrinology

## 2016-01-16 DIAGNOSIS — G894 Chronic pain syndrome: Secondary | ICD-10-CM | POA: Diagnosis not present

## 2016-01-16 DIAGNOSIS — L97421 Non-pressure chronic ulcer of left heel and midfoot limited to breakdown of skin: Secondary | ICD-10-CM | POA: Diagnosis not present

## 2016-01-16 DIAGNOSIS — E11621 Type 2 diabetes mellitus with foot ulcer: Secondary | ICD-10-CM | POA: Diagnosis not present

## 2016-01-16 DIAGNOSIS — G4733 Obstructive sleep apnea (adult) (pediatric): Secondary | ICD-10-CM

## 2016-01-16 DIAGNOSIS — E1122 Type 2 diabetes mellitus with diabetic chronic kidney disease: Secondary | ICD-10-CM | POA: Diagnosis not present

## 2016-01-16 DIAGNOSIS — I480 Paroxysmal atrial fibrillation: Secondary | ICD-10-CM | POA: Diagnosis not present

## 2016-01-16 DIAGNOSIS — E11311 Type 2 diabetes mellitus with unspecified diabetic retinopathy with macular edema: Secondary | ICD-10-CM | POA: Diagnosis not present

## 2016-01-16 DIAGNOSIS — Z89422 Acquired absence of other left toe(s): Secondary | ICD-10-CM | POA: Diagnosis not present

## 2016-01-16 DIAGNOSIS — N183 Chronic kidney disease, stage 3 (moderate): Secondary | ICD-10-CM | POA: Diagnosis not present

## 2016-01-16 DIAGNOSIS — E1142 Type 2 diabetes mellitus with diabetic polyneuropathy: Secondary | ICD-10-CM | POA: Diagnosis not present

## 2016-01-16 DIAGNOSIS — E1165 Type 2 diabetes mellitus with hyperglycemia: Secondary | ICD-10-CM | POA: Diagnosis not present

## 2016-01-16 DIAGNOSIS — I129 Hypertensive chronic kidney disease with stage 1 through stage 4 chronic kidney disease, or unspecified chronic kidney disease: Secondary | ICD-10-CM | POA: Diagnosis not present

## 2016-01-16 MED ORDER — INSULIN GLARGINE 300 UNIT/ML ~~LOC~~ SOPN: 60.0000 [IU] | PEN_INJECTOR | Freq: Two times a day (BID) | SUBCUTANEOUS | Status: DC

## 2016-01-16 NOTE — Telephone Encounter (Signed)
PT wanted to see if we can send a 90 day supply of Toujeo to [facility] on [address]

## 2016-01-16 NOTE — Telephone Encounter (Signed)
Rx submitted per pt's request.  

## 2016-01-16 NOTE — Telephone Encounter (Signed)
Pt calls and states his supplies for cpap are old and worn out, he needs new supplies, [facility] on [address] is able to provide these with an order Ph: [phone]  FAX:  [phone] Please speak w/ chilonB. For these

## 2016-01-18 DIAGNOSIS — I480 Paroxysmal atrial fibrillation: Secondary | ICD-10-CM | POA: Diagnosis not present

## 2016-01-18 DIAGNOSIS — Z89422 Acquired absence of other left toe(s): Secondary | ICD-10-CM | POA: Diagnosis not present

## 2016-01-18 DIAGNOSIS — E11311 Type 2 diabetes mellitus with unspecified diabetic retinopathy with macular edema: Secondary | ICD-10-CM | POA: Diagnosis not present

## 2016-01-18 DIAGNOSIS — E11621 Type 2 diabetes mellitus with foot ulcer: Secondary | ICD-10-CM | POA: Diagnosis not present

## 2016-01-18 DIAGNOSIS — L97421 Non-pressure chronic ulcer of left heel and midfoot limited to breakdown of skin: Secondary | ICD-10-CM | POA: Diagnosis not present

## 2016-01-18 DIAGNOSIS — E1122 Type 2 diabetes mellitus with diabetic chronic kidney disease: Secondary | ICD-10-CM | POA: Diagnosis not present

## 2016-01-18 DIAGNOSIS — E1142 Type 2 diabetes mellitus with diabetic polyneuropathy: Secondary | ICD-10-CM | POA: Diagnosis not present

## 2016-01-18 DIAGNOSIS — E1165 Type 2 diabetes mellitus with hyperglycemia: Secondary | ICD-10-CM | POA: Diagnosis not present

## 2016-01-18 DIAGNOSIS — N183 Chronic kidney disease, stage 3 (moderate): Secondary | ICD-10-CM | POA: Diagnosis not present

## 2016-01-18 DIAGNOSIS — I129 Hypertensive chronic kidney disease with stage 1 through stage 4 chronic kidney disease, or unspecified chronic kidney disease: Secondary | ICD-10-CM | POA: Diagnosis not present

## 2016-01-18 DIAGNOSIS — G894 Chronic pain syndrome: Secondary | ICD-10-CM | POA: Diagnosis not present

## 2016-01-20 DIAGNOSIS — I129 Hypertensive chronic kidney disease with stage 1 through stage 4 chronic kidney disease, or unspecified chronic kidney disease: Secondary | ICD-10-CM | POA: Diagnosis not present

## 2016-01-20 DIAGNOSIS — I480 Paroxysmal atrial fibrillation: Secondary | ICD-10-CM | POA: Diagnosis not present

## 2016-01-20 DIAGNOSIS — E1122 Type 2 diabetes mellitus with diabetic chronic kidney disease: Secondary | ICD-10-CM | POA: Diagnosis not present

## 2016-01-20 DIAGNOSIS — E11311 Type 2 diabetes mellitus with unspecified diabetic retinopathy with macular edema: Secondary | ICD-10-CM | POA: Diagnosis not present

## 2016-01-20 DIAGNOSIS — G894 Chronic pain syndrome: Secondary | ICD-10-CM | POA: Diagnosis not present

## 2016-01-20 DIAGNOSIS — E1165 Type 2 diabetes mellitus with hyperglycemia: Secondary | ICD-10-CM | POA: Diagnosis not present

## 2016-01-20 DIAGNOSIS — Z89422 Acquired absence of other left toe(s): Secondary | ICD-10-CM | POA: Diagnosis not present

## 2016-01-20 DIAGNOSIS — N183 Chronic kidney disease, stage 3 (moderate): Secondary | ICD-10-CM | POA: Diagnosis not present

## 2016-01-20 DIAGNOSIS — E11621 Type 2 diabetes mellitus with foot ulcer: Secondary | ICD-10-CM | POA: Diagnosis not present

## 2016-01-20 DIAGNOSIS — L97421 Non-pressure chronic ulcer of left heel and midfoot limited to breakdown of skin: Secondary | ICD-10-CM | POA: Diagnosis not present

## 2016-01-20 DIAGNOSIS — E1142 Type 2 diabetes mellitus with diabetic polyneuropathy: Secondary | ICD-10-CM | POA: Diagnosis not present

## 2016-01-21 ENCOUNTER — Ambulatory Visit (INDEPENDENT_AMBULATORY_CARE_PROVIDER_SITE_OTHER): Payer: Medicare Other | Admitting: Sports Medicine

## 2016-01-21 ENCOUNTER — Encounter: Payer: Self-pay | Admitting: Sports Medicine

## 2016-01-21 DIAGNOSIS — E11621 Type 2 diabetes mellitus with foot ulcer: Secondary | ICD-10-CM

## 2016-01-21 DIAGNOSIS — L89891 Pressure ulcer of other site, stage 1: Secondary | ICD-10-CM | POA: Diagnosis not present

## 2016-01-21 DIAGNOSIS — M79672 Pain in left foot: Secondary | ICD-10-CM

## 2016-01-21 DIAGNOSIS — Z89432 Acquired absence of left foot: Secondary | ICD-10-CM

## 2016-01-21 DIAGNOSIS — E1140 Type 2 diabetes mellitus with diabetic neuropathy, unspecified: Secondary | ICD-10-CM

## 2016-01-21 DIAGNOSIS — L97529 Non-pressure chronic ulcer of other part of left foot with unspecified severity: Principal | ICD-10-CM

## 2016-01-21 DIAGNOSIS — IMO0002 Reserved for concepts with insufficient information to code with codable children: Secondary | ICD-10-CM

## 2016-01-21 MED ORDER — SULFAMETHOXAZOLE-TRIMETHOPRIM 800-160 MG PO TABS
1.0000 | ORAL_TABLET | Freq: Two times a day (BID) | ORAL | Status: DC
Start: 2016-01-21 — End: 2016-02-11

## 2016-01-21 NOTE — Progress Notes (Signed)
Patient ID: [name], male   DOB: [birth year], 65 y.o.   MRN: [id number]  Subjective: [name] is a 65 y.o. male patient seen in office for follow up evaluation of ulceration of the left foot. Patient has a history of diabetes and a blood glucose level today not recorded.   Patient has nursing helping to change the dressing 2-3x/week. Admits to continued swelling. Denies nausea/fever/vomiting/chills/night sweats/shortness of breath/pain. Patient has no other pedal complaints at this time.  Patient Active Problem List   Diagnosis Date Noted  . Chronic pain syndrome 09/18/2015  . Chronic fatigue 04/16/2015  . Preventative health care 03/12/2015  . Family history of coronary artery disease in father 12/14/2014  . Sleep apnea-on C-pap 12/14/2014  . Acute osteomyelitis of toe of left foot ([place])   . Diabetic foot infection ([place]) 12/13/2014  . Foot ulcer, left ([place]) 12/13/2014  . Paroxysmal atrial fibrillation, new onset 12/13/2014  . Elevated alkaline phosphatase level 06/11/2014  . Constipation 05/31/2014  . Vitamin D deficiency 11/17/2013  . Frequent falls 06/30/2013  . Mild memory disturbance 07/27/2012  . Anemia 02/10/2012  . Hypertension 11/18/2011  . Obstructive sleep apnea 12/12/2009  . EDEMA 02/13/2009  . Callus of foot 12/13/2008  . GERD 06/27/2008  . Diabetes type 2, uncontrolled ([place]) 12/28/2007  . DIABETIC MACULAR EDEMA 10/07/2007  . Dyslipidemia 08/15/2007  . BACKGROUND DIABETIC RETINOPATHY 08/15/2007  . ERECTILE DYSFUNCTION 05/31/2007  . HEARING LOSS, SENSORINEURAL, BILATERAL 01/03/2007  . Diabetic peripheral neuropathy associated with type 2 diabetes mellitus ([place]) 12/16/2006  . Chronic kidney disease, stage III (moderate) 12/01/2006  . SKIN LESION 09/27/2006  . Obesity BMI 46 08/23/2006  . STATUS, OTHER TOE(S) AMPUTATION 05/21/2006   Current Outpatient Prescriptions on File Prior to Visit  Medication Sig Dispense Refill  . ACCU-CHEK FASTCLIX LANCETS  MISC Check blood sugar 4 times a day before meals and bedtime dx code 250.02 insulin requiring 102 each 6  . acetaminophen (TYLENOL) 500 MG tablet Take 1 tablet (500 mg total) by mouth every 4 (four) hours as needed. Maximum Daily Dosage of 2000 mg 100 tablet 0  . albuterol (PROAIR HFA) 108 (90 Base) MCG/ACT inhaler Inhale 2 puffs into the lungs every 6 (six) hours as needed for wheezing or shortness of breath. 18 g 3  . amoxicillin-clavulanate (AUGMENTIN) 875-125 MG tablet Take 1 tablet by mouth 2 (two) times daily. 20 tablet 0  . atorvastatin (LIPITOR) 20 MG tablet Take 1 tablet (20 mg total) by mouth daily. 90 tablet 3  . BESIVANCE 0.6 % SUSP Place 1 drop into both eyes 4 (four) times daily.   12  . Blood Glucose Monitoring Suppl (ACCU-CHEK AVIVA PLUS) W/DEVICE KIT 1 each by Does not apply route 4 (four) times daily -  with meals and at bedtime. 1 kit 0  . cadexomer iodine (IODOSORB) 0.9 % gel Apply 1 application topically daily as needed for wound care. 40 g 0  . capsaicin (ZOSTRIX) 0.025 % cream Apply 1 application topically daily as needed (for pain).    . Cholecalciferol (VITAMIN D3) 2000 UNITS capsule Take 1 capsule (2,000 Units total) by mouth daily. 90 capsule 0  . docusate sodium (COLACE) 100 MG capsule Take 1 capsule (100 mg total) by mouth 2 (two) times daily. (Patient not taking: Reported on 12/16/2015) 60 capsule 0  . ELIQUIS 5 MG TABS tablet TAKE 1 TABLET(5 MG) BY MOUTH TWICE DAILY 180 tablet 1  . enalapril (VASOTEC) 20 MG tablet TAKE 2 TABLETS(40 MG)  BY MOUTH DAILY 90 tablet 3  . furosemide (LASIX) 40 MG tablet Take 1 tablet (40 mg total) by mouth 2 (two) times daily. 60 tablet 3  . glucose blood (ACCU-CHEK AVIVA PLUS) test strip USE TO CHECK BLOOD SUGAR FOUR TIMES DAILY BEFORE MEALS AND AT BEDTIME 150 each 11  . HYDROcodone-acetaminophen (NORCO/VICODIN) 5-325 MG tablet Take 1 and 1/2 tablets by mouth every 6 hours as needed for pain 140 tablet 0  . Insulin Glargine (TOUJEO SOLOSTAR)  300 UNIT/ML SOPN Inject 60-90 Units into the skin 2 (two) times daily. 45 pen 2  . Insulin Pen Needle 31G X 5 MM MISC Use for insulin injection 5 times a day. 150 each 5  . insulin regular human CONCENTRATED (HUMULIN R) 500 UNIT/ML kwikpen Inject 20 units at breakfast, 60 units at lunch and 70 units at dinner. 18 mL 3  . LUMIGAN 0.01 % SOLN Place 1 drop into both eyes at bedtime.     . metoprolol succinate (TOPROL-XL) 25 MG 24 hr tablet Take 1 tablet (25 mg total) by mouth daily. 90 tablet 3  . omeprazole (PRILOSEC) 20 MG capsule Take 1 capsule (20 mg total) by mouth daily. 90 capsule 0  . promethazine (PHENERGAN) 25 MG tablet TAKE 1/2 TO 1 TABLET BY MOUTH EVERY 6 HOURS AS NEEDED FOR NAUSEA 30 tablet 0  . silver sulfADIAZINE (SILVADENE) 1 % cream Apply 1 application topically daily. 50 g 0  . SIMBRINZA 1-0.2 % SUSP Place 1 drop into both eyes 2 (two) times daily. Take as instructed by Dr. Katy Fitch.    Marland Kitchen VICTOZA 18 MG/3ML SOPN Inject 1.8 mg once daily at the same time 9 mL 3   No current facility-administered medications on file prior to visit.   Allergies  Allergen Reactions  . Vancomycin     REACTION: ARF    Recent Results (from the past 2160 hour(s))  Wound culture     Status: None   Collection Time: 11/12/15  3:50 PM  Result Value Ref Range   Gram Stain Rare    Gram Stain WBC present-predominately Mononuclear    Gram Stain No Squamous Epithelial Cells Seen    Gram Stain Abundant Gram Positive Cocci In Pairs In Clusters    Organism ID, Bacteria Abundant GROUP B STREP (S.AGALACTIAE) ISOLATED     Comment: Beta hemolytic streptococci are predictably susceptible to penicillin and other beta-lactams. Susceptibility testing not routinely performed.   Hemoglobin A1c     Status: Abnormal   Collection Time: 12/12/15 12:19 PM  Result Value Ref Range   Hgb A1c MFr Bld 10.0 (H) 4.6 - 6.5 %    Comment: Glycemic Control Guidelines for People with Diabetes:Non Diabetic:  <6%Goal of Therapy:  <7%Additional Action Suggested:  >8%   Comprehensive metabolic panel     Status: Abnormal   Collection Time: 12/12/15 12:19 PM  Result Value Ref Range   Sodium 134 (L) 135 - 145 mEq/L   Potassium 4.2 3.5 - 5.1 mEq/L   Chloride 101 96 - 112 mEq/L   CO2 28 19 - 32 mEq/L   Glucose, Bld 289 (H) 70 - 99 mg/dL   BUN 25 (H) 6 - 23 mg/dL   Creatinine, Ser 1.77 (H) 0.40 - 1.50 mg/dL   Total Bilirubin 0.5 0.2 - 1.2 mg/dL   Alkaline Phosphatase 132 (H) 39 - 117 U/L   AST 22 0 - 37 U/L   ALT 23 0 - 53 U/L   Total Protein 7.3 6.0 - 8.3  g/dL   Albumin 3.8 3.5 - 5.2 g/dL   Calcium 9.4 8.4 - 10.5 mg/dL   GFR 49.78 (L) >60.00 mL/min  ToxAssure Select,+Antidepr,UR     Status: None   Collection Time: 01/01/16 11:09 AM  Result Value Ref Range   ToxASSURE Select (Plus) FINAL     Comment: ==================================================================== TOXASSURE SELECT,+ANTIDEPR,UR ==================================================================== Test                             Result       Flag       Units Drug Present and Declared for Prescription Verification   Hydrocodone                    122          EXPECTED   ng/mg creat   Hydromorphone                  158          EXPECTED   ng/mg creat   Norhydrocodone                 164          EXPECTED   ng/mg creat    Sources of hydrocodone include scheduled prescription    medications. Hydromorphone and norhydrocodone are expected    metabolites of hydrocodone. Hydromorphone is also available as a    scheduled prescription medication. ==================================================================== Test                      Result    Flag   Units      Ref Range   Creatinine              105              mg/dL      >=20 ==================================================================== Decl ared Medications:  The flagging and interpretation on this report are based on the  following declared medications.  Unexpected results may  arise from  inaccuracies in the declared medications.  **Note: The testing scope of this panel includes these medications:  Hydrocodone 12-31-15 ==================================================================== For clinical consultation, please call [phone]. ====================================================================    PDF .     Objective: There were no vitals filed for this visit.  General: Patient is awake, alert, oriented x 3 and in no acute distress.  Dermatology: Skin is warm and dry bilateral with a now full thickness ulceration present  Left plantar forefoot. Ulceration measures 1cm x 2cm x 0.5cm  (last measurement 1 cm x 1.5 cm x 0.3 cm) extending into skin crease at amp site. There is a mildly macerated and moderate keratotic border with a granular base. The ulceration does not probe to bone. There is mild malodor, no active drainage, no erythema, + swelling without warmth to dorsum of foot. No other acute signs of infection.   Vascular: Dorsalis Pedis pulse = 0/4 Bilateral,  Posterior Tibial pulse = 1/4 Bilateral,  Capillary Fill Time < 5 seconds at remaining toes   Neurologic: Protective sensation absent bilateral using SWMF.  Musculosketal: No Pain with palpation to ulcerated area on left. No pain with compression to calves bilateral. 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  Assessment and Plan:  Problem List Items Addressed This Visit    None    Visit Diagnoses    Diabetic ulcer of left foot associated with type 2 diabetes mellitus ([place])    -  Primary    Relevant Medications    sulfamethoxazole-trimethoprim (BACTRIM DS,SEPTRA DS) 800-160 MG tablet    Other Relevant Orders    MR Foot Left Wo Contrast    Foot amputation status, left ([facility]CC)        Relevant Medications    sulfamethoxazole-trimethoprim (BACTRIM DS,SEPTRA DS) 800-160 MG tablet    Other Relevant Orders    MR Foot Left Wo Contrast    Type 2 diabetes mellitus with diabetic  neuropathy, unspecified long term insulin use status ([facility])        Relevant Medications    sulfamethoxazole-trimethoprim (BACTRIM DS,SEPTRA DS) 800-160 MG tablet    Other Relevant Orders    MR Foot Left Wo Contrast    Foot pain, left        Relevant Medications    sulfamethoxazole-trimethoprim (BACTRIM DS,SEPTRA DS) 800-160 MG tablet    Other Relevant Orders    MR Foot Left Wo Contrast      -Examined patient and discussed the progression of the wound and treatment alternatives. - Excisionally dedbrided ulceration to healthy bleeding borders using a sterile chisel blade.  -Applied Iodosorb and dry sterile dressing and instructed patient continue with home nursing 3x per week consisting of betadine and dry sterile dressing. -Rx Bactrim for preventative measures since foot is with odor and mildly swollen -Rx MRI to further eval bone since wound not significantly improve and patient osteomyelitis history -Applied offloading pad to post op shoe to use daily to left foot -Advised patient to refrain from getting the ulceration wet and to limit activity to necessity -Advised patient to go to the ER or return to office if the wound worsens or if constitutional symptoms are present. -Patient to return to office after MRI for follow up care and evaluation or sooner if problems arise.  Landis Martins, DPM

## 2016-01-22 ENCOUNTER — Telehealth: Payer: Self-pay | Admitting: *Deleted

## 2016-01-22 NOTE — Telephone Encounter (Addendum)
01/21/2016-MRI orders given to [facility] for pre-cert. [id number] Arville Go states she has reinstated pt to wound care service in home, will visit 3x week for 6 weeks, then 1x week for 3 weeks.  Left message for Levada Dy confirming receipt of the visit and acceptance. 02/25/2016-Angela - [facility] states pt needs to continue the dressing changes 3 x wk.  Dr. Marcene Duos continued 3xwk wound care as previously ordered. Left message Levada Dy [phone].

## 2016-01-23 DIAGNOSIS — N183 Chronic kidney disease, stage 3 (moderate): Secondary | ICD-10-CM | POA: Diagnosis not present

## 2016-01-23 DIAGNOSIS — E1142 Type 2 diabetes mellitus with diabetic polyneuropathy: Secondary | ICD-10-CM | POA: Diagnosis not present

## 2016-01-23 DIAGNOSIS — Z89422 Acquired absence of other left toe(s): Secondary | ICD-10-CM | POA: Diagnosis not present

## 2016-01-23 DIAGNOSIS — I480 Paroxysmal atrial fibrillation: Secondary | ICD-10-CM | POA: Diagnosis not present

## 2016-01-23 DIAGNOSIS — E11621 Type 2 diabetes mellitus with foot ulcer: Secondary | ICD-10-CM | POA: Diagnosis not present

## 2016-01-23 DIAGNOSIS — G894 Chronic pain syndrome: Secondary | ICD-10-CM | POA: Diagnosis not present

## 2016-01-23 DIAGNOSIS — E1122 Type 2 diabetes mellitus with diabetic chronic kidney disease: Secondary | ICD-10-CM | POA: Diagnosis not present

## 2016-01-23 DIAGNOSIS — L97421 Non-pressure chronic ulcer of left heel and midfoot limited to breakdown of skin: Secondary | ICD-10-CM | POA: Diagnosis not present

## 2016-01-23 DIAGNOSIS — E1165 Type 2 diabetes mellitus with hyperglycemia: Secondary | ICD-10-CM | POA: Diagnosis not present

## 2016-01-23 DIAGNOSIS — E11311 Type 2 diabetes mellitus with unspecified diabetic retinopathy with macular edema: Secondary | ICD-10-CM | POA: Diagnosis not present

## 2016-01-23 DIAGNOSIS — I129 Hypertensive chronic kidney disease with stage 1 through stage 4 chronic kidney disease, or unspecified chronic kidney disease: Secondary | ICD-10-CM | POA: Diagnosis not present

## 2016-01-24 ENCOUNTER — Telehealth: Payer: Self-pay | Admitting: Dietician

## 2016-01-24 NOTE — Telephone Encounter (Signed)
Patient calls asking about his order for CPAP supplies and when he may get them?

## 2016-01-24 NOTE — Telephone Encounter (Signed)
Spoke with [name] in the front office  who said she would call [name] about his CPAP supply order.

## 2016-01-25 DIAGNOSIS — G894 Chronic pain syndrome: Secondary | ICD-10-CM | POA: Diagnosis not present

## 2016-01-25 DIAGNOSIS — N183 Chronic kidney disease, stage 3 (moderate): Secondary | ICD-10-CM | POA: Diagnosis not present

## 2016-01-25 DIAGNOSIS — E11621 Type 2 diabetes mellitus with foot ulcer: Secondary | ICD-10-CM | POA: Diagnosis not present

## 2016-01-25 DIAGNOSIS — L97421 Non-pressure chronic ulcer of left heel and midfoot limited to breakdown of skin: Secondary | ICD-10-CM | POA: Diagnosis not present

## 2016-01-25 DIAGNOSIS — E11311 Type 2 diabetes mellitus with unspecified diabetic retinopathy with macular edema: Secondary | ICD-10-CM | POA: Diagnosis not present

## 2016-01-25 DIAGNOSIS — E1165 Type 2 diabetes mellitus with hyperglycemia: Secondary | ICD-10-CM | POA: Diagnosis not present

## 2016-01-25 DIAGNOSIS — I129 Hypertensive chronic kidney disease with stage 1 through stage 4 chronic kidney disease, or unspecified chronic kidney disease: Secondary | ICD-10-CM | POA: Diagnosis not present

## 2016-01-25 DIAGNOSIS — E1142 Type 2 diabetes mellitus with diabetic polyneuropathy: Secondary | ICD-10-CM | POA: Diagnosis not present

## 2016-01-25 DIAGNOSIS — I480 Paroxysmal atrial fibrillation: Secondary | ICD-10-CM | POA: Diagnosis not present

## 2016-01-25 DIAGNOSIS — E1122 Type 2 diabetes mellitus with diabetic chronic kidney disease: Secondary | ICD-10-CM | POA: Diagnosis not present

## 2016-01-25 DIAGNOSIS — Z89422 Acquired absence of other left toe(s): Secondary | ICD-10-CM | POA: Diagnosis not present

## 2016-01-27 ENCOUNTER — Other Ambulatory Visit: Payer: Self-pay | Admitting: *Deleted

## 2016-01-27 DIAGNOSIS — G894 Chronic pain syndrome: Secondary | ICD-10-CM

## 2016-01-27 MED ORDER — HYDROCODONE-ACETAMINOPHEN 5-325 MG PO TABS: ORAL_TABLET | ORAL | Status: DC

## 2016-01-28 NOTE — Telephone Encounter (Signed)
Follow Up-Forms where fax to [facility] for Pt's CPAP Per [place] for Supplies to be sent  To [phone]

## 2016-01-28 NOTE — Telephone Encounter (Signed)
Follow up with CPAP Supplies. Information was faxed to [facility] for patient's supplies @ [phone] per Helen's previous request for the patient.

## 2016-01-29 DIAGNOSIS — E1122 Type 2 diabetes mellitus with diabetic chronic kidney disease: Secondary | ICD-10-CM | POA: Diagnosis not present

## 2016-01-29 DIAGNOSIS — E1165 Type 2 diabetes mellitus with hyperglycemia: Secondary | ICD-10-CM | POA: Diagnosis not present

## 2016-01-29 DIAGNOSIS — N183 Chronic kidney disease, stage 3 (moderate): Secondary | ICD-10-CM | POA: Diagnosis not present

## 2016-01-29 DIAGNOSIS — Z89422 Acquired absence of other left toe(s): Secondary | ICD-10-CM | POA: Diagnosis not present

## 2016-01-29 DIAGNOSIS — E11621 Type 2 diabetes mellitus with foot ulcer: Secondary | ICD-10-CM | POA: Diagnosis not present

## 2016-01-29 DIAGNOSIS — L97421 Non-pressure chronic ulcer of left heel and midfoot limited to breakdown of skin: Secondary | ICD-10-CM | POA: Diagnosis not present

## 2016-01-29 DIAGNOSIS — G894 Chronic pain syndrome: Secondary | ICD-10-CM | POA: Diagnosis not present

## 2016-01-29 DIAGNOSIS — I129 Hypertensive chronic kidney disease with stage 1 through stage 4 chronic kidney disease, or unspecified chronic kidney disease: Secondary | ICD-10-CM | POA: Diagnosis not present

## 2016-01-29 DIAGNOSIS — I480 Paroxysmal atrial fibrillation: Secondary | ICD-10-CM | POA: Diagnosis not present

## 2016-01-29 DIAGNOSIS — E1142 Type 2 diabetes mellitus with diabetic polyneuropathy: Secondary | ICD-10-CM | POA: Diagnosis not present

## 2016-01-29 DIAGNOSIS — E11311 Type 2 diabetes mellitus with unspecified diabetic retinopathy with macular edema: Secondary | ICD-10-CM | POA: Diagnosis not present

## 2016-01-29 NOTE — Telephone Encounter (Signed)
Called patient and notified him of this information.  Also asks about pain medicine- wants to know if he can pick it up tomorrow because he has an MRI on Friday. Informed him that per Bonnita Nasuti, our triage nurse, he can pick it up tomorrow.

## 2016-01-31 ENCOUNTER — Ambulatory Visit
Admission: RE | Admit: 2016-01-31 | Discharge: 2016-01-31 | Disposition: A | Discharge status: Home / Self Care | Payer: Medicare Other | Source: Ambulatory Visit | Attending: Sports Medicine | Admitting: Sports Medicine

## 2016-01-31 DIAGNOSIS — E1140 Type 2 diabetes mellitus with diabetic neuropathy, unspecified: Secondary | ICD-10-CM

## 2016-01-31 DIAGNOSIS — N183 Chronic kidney disease, stage 3 (moderate): Secondary | ICD-10-CM | POA: Diagnosis not present

## 2016-01-31 DIAGNOSIS — I480 Paroxysmal atrial fibrillation: Secondary | ICD-10-CM | POA: Diagnosis not present

## 2016-01-31 DIAGNOSIS — M79672 Pain in left foot: Secondary | ICD-10-CM

## 2016-01-31 DIAGNOSIS — E1165 Type 2 diabetes mellitus with hyperglycemia: Secondary | ICD-10-CM | POA: Diagnosis not present

## 2016-01-31 DIAGNOSIS — E11621 Type 2 diabetes mellitus with foot ulcer: Secondary | ICD-10-CM

## 2016-01-31 DIAGNOSIS — E1122 Type 2 diabetes mellitus with diabetic chronic kidney disease: Secondary | ICD-10-CM | POA: Diagnosis not present

## 2016-01-31 DIAGNOSIS — Z89422 Acquired absence of other left toe(s): Secondary | ICD-10-CM | POA: Diagnosis not present

## 2016-01-31 DIAGNOSIS — L97529 Non-pressure chronic ulcer of other part of left foot with unspecified severity: Principal | ICD-10-CM

## 2016-01-31 DIAGNOSIS — I129 Hypertensive chronic kidney disease with stage 1 through stage 4 chronic kidney disease, or unspecified chronic kidney disease: Secondary | ICD-10-CM | POA: Diagnosis not present

## 2016-01-31 DIAGNOSIS — E1142 Type 2 diabetes mellitus with diabetic polyneuropathy: Secondary | ICD-10-CM | POA: Diagnosis not present

## 2016-01-31 DIAGNOSIS — G894 Chronic pain syndrome: Secondary | ICD-10-CM | POA: Diagnosis not present

## 2016-01-31 DIAGNOSIS — L97421 Non-pressure chronic ulcer of left heel and midfoot limited to breakdown of skin: Secondary | ICD-10-CM | POA: Diagnosis not present

## 2016-01-31 DIAGNOSIS — R600 Localized edema: Secondary | ICD-10-CM | POA: Diagnosis not present

## 2016-01-31 DIAGNOSIS — E11311 Type 2 diabetes mellitus with unspecified diabetic retinopathy with macular edema: Secondary | ICD-10-CM | POA: Diagnosis not present

## 2016-01-31 DIAGNOSIS — IMO0002 Reserved for concepts with insufficient information to code with codable children: Secondary | ICD-10-CM

## 2016-02-05 DIAGNOSIS — E1142 Type 2 diabetes mellitus with diabetic polyneuropathy: Secondary | ICD-10-CM | POA: Diagnosis not present

## 2016-02-05 DIAGNOSIS — Z89422 Acquired absence of other left toe(s): Secondary | ICD-10-CM | POA: Diagnosis not present

## 2016-02-05 DIAGNOSIS — G894 Chronic pain syndrome: Secondary | ICD-10-CM | POA: Diagnosis not present

## 2016-02-05 DIAGNOSIS — L97421 Non-pressure chronic ulcer of left heel and midfoot limited to breakdown of skin: Secondary | ICD-10-CM | POA: Diagnosis not present

## 2016-02-05 DIAGNOSIS — N183 Chronic kidney disease, stage 3 (moderate): Secondary | ICD-10-CM | POA: Diagnosis not present

## 2016-02-05 DIAGNOSIS — E1165 Type 2 diabetes mellitus with hyperglycemia: Secondary | ICD-10-CM | POA: Diagnosis not present

## 2016-02-05 DIAGNOSIS — E1122 Type 2 diabetes mellitus with diabetic chronic kidney disease: Secondary | ICD-10-CM | POA: Diagnosis not present

## 2016-02-05 DIAGNOSIS — I129 Hypertensive chronic kidney disease with stage 1 through stage 4 chronic kidney disease, or unspecified chronic kidney disease: Secondary | ICD-10-CM | POA: Diagnosis not present

## 2016-02-05 DIAGNOSIS — E11311 Type 2 diabetes mellitus with unspecified diabetic retinopathy with macular edema: Secondary | ICD-10-CM | POA: Diagnosis not present

## 2016-02-05 DIAGNOSIS — E11621 Type 2 diabetes mellitus with foot ulcer: Secondary | ICD-10-CM | POA: Diagnosis not present

## 2016-02-05 DIAGNOSIS — I480 Paroxysmal atrial fibrillation: Secondary | ICD-10-CM | POA: Diagnosis not present

## 2016-02-07 ENCOUNTER — Other Ambulatory Visit: Payer: Self-pay | Admitting: Internal Medicine

## 2016-02-07 DIAGNOSIS — E1122 Type 2 diabetes mellitus with diabetic chronic kidney disease: Secondary | ICD-10-CM | POA: Diagnosis not present

## 2016-02-07 DIAGNOSIS — E1165 Type 2 diabetes mellitus with hyperglycemia: Secondary | ICD-10-CM | POA: Diagnosis not present

## 2016-02-07 DIAGNOSIS — E11621 Type 2 diabetes mellitus with foot ulcer: Secondary | ICD-10-CM | POA: Diagnosis not present

## 2016-02-07 DIAGNOSIS — N183 Chronic kidney disease, stage 3 (moderate): Secondary | ICD-10-CM | POA: Diagnosis not present

## 2016-02-07 DIAGNOSIS — E11311 Type 2 diabetes mellitus with unspecified diabetic retinopathy with macular edema: Secondary | ICD-10-CM | POA: Diagnosis not present

## 2016-02-07 DIAGNOSIS — L97421 Non-pressure chronic ulcer of left heel and midfoot limited to breakdown of skin: Secondary | ICD-10-CM | POA: Diagnosis not present

## 2016-02-07 DIAGNOSIS — E1142 Type 2 diabetes mellitus with diabetic polyneuropathy: Secondary | ICD-10-CM | POA: Diagnosis not present

## 2016-02-07 DIAGNOSIS — Z89422 Acquired absence of other left toe(s): Secondary | ICD-10-CM | POA: Diagnosis not present

## 2016-02-07 DIAGNOSIS — G894 Chronic pain syndrome: Secondary | ICD-10-CM | POA: Diagnosis not present

## 2016-02-07 DIAGNOSIS — I129 Hypertensive chronic kidney disease with stage 1 through stage 4 chronic kidney disease, or unspecified chronic kidney disease: Secondary | ICD-10-CM | POA: Diagnosis not present

## 2016-02-07 DIAGNOSIS — I480 Paroxysmal atrial fibrillation: Secondary | ICD-10-CM | POA: Diagnosis not present

## 2016-02-10 ENCOUNTER — Telehealth: Payer: Self-pay | Admitting: Internal Medicine

## 2016-02-10 DIAGNOSIS — L97421 Non-pressure chronic ulcer of left heel and midfoot limited to breakdown of skin: Secondary | ICD-10-CM | POA: Diagnosis not present

## 2016-02-10 DIAGNOSIS — E1165 Type 2 diabetes mellitus with hyperglycemia: Secondary | ICD-10-CM | POA: Diagnosis not present

## 2016-02-10 DIAGNOSIS — E1122 Type 2 diabetes mellitus with diabetic chronic kidney disease: Secondary | ICD-10-CM | POA: Diagnosis not present

## 2016-02-10 DIAGNOSIS — I129 Hypertensive chronic kidney disease with stage 1 through stage 4 chronic kidney disease, or unspecified chronic kidney disease: Secondary | ICD-10-CM | POA: Diagnosis not present

## 2016-02-10 DIAGNOSIS — N183 Chronic kidney disease, stage 3 (moderate): Secondary | ICD-10-CM | POA: Diagnosis not present

## 2016-02-10 DIAGNOSIS — I480 Paroxysmal atrial fibrillation: Secondary | ICD-10-CM | POA: Diagnosis not present

## 2016-02-10 DIAGNOSIS — E11621 Type 2 diabetes mellitus with foot ulcer: Secondary | ICD-10-CM | POA: Diagnosis not present

## 2016-02-10 DIAGNOSIS — E1142 Type 2 diabetes mellitus with diabetic polyneuropathy: Secondary | ICD-10-CM | POA: Diagnosis not present

## 2016-02-10 DIAGNOSIS — G894 Chronic pain syndrome: Secondary | ICD-10-CM | POA: Diagnosis not present

## 2016-02-10 DIAGNOSIS — Z89422 Acquired absence of other left toe(s): Secondary | ICD-10-CM | POA: Diagnosis not present

## 2016-02-10 DIAGNOSIS — E11311 Type 2 diabetes mellitus with unspecified diabetic retinopathy with macular edema: Secondary | ICD-10-CM | POA: Diagnosis not present

## 2016-02-10 NOTE — Telephone Encounter (Signed)
APT. REMINDER CALL, LMTCB °

## 2016-02-11 ENCOUNTER — Other Ambulatory Visit (INDEPENDENT_AMBULATORY_CARE_PROVIDER_SITE_OTHER): Payer: Medicare Other

## 2016-02-11 ENCOUNTER — Ambulatory Visit (INDEPENDENT_AMBULATORY_CARE_PROVIDER_SITE_OTHER): Payer: Medicare Other | Admitting: Internal Medicine

## 2016-02-11 ENCOUNTER — Encounter: Payer: Self-pay | Admitting: Internal Medicine

## 2016-02-11 ENCOUNTER — Other Ambulatory Visit: Payer: Medicare Other

## 2016-02-11 DIAGNOSIS — G894 Chronic pain syndrome: Secondary | ICD-10-CM

## 2016-02-11 DIAGNOSIS — Z794 Long term (current) use of insulin: Secondary | ICD-10-CM

## 2016-02-11 DIAGNOSIS — Z79891 Long term (current) use of opiate analgesic: Secondary | ICD-10-CM

## 2016-02-11 DIAGNOSIS — E1165 Type 2 diabetes mellitus with hyperglycemia: Secondary | ICD-10-CM

## 2016-02-11 DIAGNOSIS — I10 Essential (primary) hypertension: Secondary | ICD-10-CM

## 2016-02-11 DIAGNOSIS — IMO0002 Reserved for concepts with insufficient information to code with codable children: Secondary | ICD-10-CM

## 2016-02-11 DIAGNOSIS — I129 Hypertensive chronic kidney disease with stage 1 through stage 4 chronic kidney disease, or unspecified chronic kidney disease: Secondary | ICD-10-CM

## 2016-02-11 DIAGNOSIS — E1122 Type 2 diabetes mellitus with diabetic chronic kidney disease: Secondary | ICD-10-CM

## 2016-02-11 DIAGNOSIS — N183 Chronic kidney disease, stage 3 unspecified: Secondary | ICD-10-CM

## 2016-02-11 DIAGNOSIS — E11621 Type 2 diabetes mellitus with foot ulcer: Secondary | ICD-10-CM

## 2016-02-11 DIAGNOSIS — L97529 Non-pressure chronic ulcer of other part of left foot with unspecified severity: Secondary | ICD-10-CM

## 2016-02-11 LAB — COMPREHENSIVE METABOLIC PANEL
ALBUMIN: 3.9 g/dL (ref 3.5–5.2)
ALK PHOS: 143 U/L — AB (ref 39–117)
ALT: 17 U/L (ref 0–53)
AST: 15 U/L (ref 0–37)
BUN: 27 mg/dL — ABNORMAL HIGH (ref 6–23)
CHLORIDE: 105 meq/L (ref 96–112)
CO2: 24 mEq/L (ref 19–32)
Calcium: 9.9 mg/dL (ref 8.4–10.5)
Creatinine, Ser: 1.78 mg/dL — ABNORMAL HIGH (ref 0.40–1.50)
GFR: 49.43 mL/min — AB (ref >60.00)
Glucose, Bld: 112 mg/dL — ABNORMAL HIGH (ref 70–99)
POTASSIUM: 5 meq/L (ref 3.5–5.1)
Sodium: 136 mEq/L (ref 135–145)
TOTAL PROTEIN: 7.9 g/dL (ref 6.0–8.3)
Total Bilirubin: 0.4 mg/dL (ref 0.2–1.2)

## 2016-02-11 MED ORDER — PREGABALIN 25 MG PO CAPS: 25.0000 mg | ORAL_CAPSULE | Freq: Three times a day (TID) | ORAL | 2 refills | Status: DC

## 2016-02-11 NOTE — Assessment & Plan Note (Signed)
-   Blood sugars still elevated up to the 300s with the average in the early 200s - He did have some hypoglycemic episodes which he attributes to not eating after taking his insulin - He is to follow up with Dr. Dwyane Dee next week.  - His last A1C had worsened and was approx 10

## 2016-02-11 NOTE — Assessment & Plan Note (Signed)
-   Patient now with recurrent left foot ulcer - He is following with podiatry and had it debrided recently - MRI with no evidence of osteomyelitis or abscess - He denies purulent drainage or fevers -  He completed a 2 week course of bactrim and will f/u podiatry on Monday - Wound care nurse visiting him 3 *week and changing dressings - Patient will need close follow up with podiatry and advised him of warning signs of infection.

## 2016-02-11 NOTE — Assessment & Plan Note (Signed)
-   Creatinine remains stable. Was 1.77 in June - He is to get blood work today with Dr. Dwyane Dee - Will f/u results

## 2016-02-11 NOTE — Assessment & Plan Note (Signed)
-   Patient still with occasional breakthrough pain  - Will add lyrica for pain control - Hopefully will eventually be able to titrate his vicodin down but currently it is not possible as it improves his quality of life and ability to do his ADLs - Will check UDS on next visit

## 2016-02-11 NOTE — Progress Notes (Signed)
   Subjective:    Patient ID: [name], male    DOB: [birth year], 66 y.o.   MRN: [id number]  HPI  Patient seen and examined. [name] is here for routine follow up of his HTN. He states that he developed a left foot ulcer after getting a callus removed and has been following up with his podiatrist for a foot ulcer which he got debrided on his last visit with them. He states a wound care nurse has been coming home and dressing it 3 times/week and told him it is looking healthy. No fevers/chills, no purulent drainage. Had an MRI done recently with no evidence of osteomyelitis or abscess and finished a 2 week course of bactrim prescribed by podiatry He is compliant with all his meds otherwise and denies any new complaints. He does state that his chronic pain is under control but does flare occasionally since we lowered the number of pills of vicodin he is being prescribed.    Review of Systems  Constitutional: Negative.  Negative for chills, fatigue and fever.  HENT: Negative.   Respiratory: Negative.   Cardiovascular: Negative.   Gastrointestinal: Negative.   Musculoskeletal: Positive for arthralgias and myalgias. Negative for gait problem and joint swelling.  Skin: Positive for wound.       Patient complains of left foot ulcer  Neurological: Negative.   Psychiatric/Behavioral: Negative.        Objective:   Physical Exam  Constitutional: He is oriented to person, place, and time. He appears well-developed and well-nourished.  HENT:  Head: Normocephalic and atraumatic.  Eyes: Right eye exhibits no discharge. Left eye exhibits no discharge. No scleral icterus.  Cardiovascular: Normal rate, regular rhythm and normal heart sounds.   Pulmonary/Chest: Effort normal and breath sounds normal. No respiratory distress. He has no wheezes.  Abdominal: Soft. Bowel sounds are normal. He exhibits no distension. There is no tenderness.  Musculoskeletal: Normal range of motion. He  exhibits no edema.  Neurological: He is alert and oriented to person, place, and time.  Skin: Skin is warm.  L foot in walking boot with dressing intact  Psychiatric: He has a normal mood and affect. His behavior is normal.          Assessment & Plan:  Please see problem based charting for assessment and plan:

## 2016-02-11 NOTE — Assessment & Plan Note (Signed)
BP Readings from Last 3 Encounters:  02/11/16 (!) 152/69  01/01/16 130/75  12/16/15 124/68    Lab Results  Component Value Date   NA 134 (L) 12/12/2015   K 4.2 12/12/2015   CREATININE 1.77 (H) 12/12/2015    Assessment: Blood pressure control:  fair Progress toward BP goal:   deteriorated Comments: compliant with meds but has not taken them this morning  Plan: Medications:  continue current medications Educational resources provided:   Self management tools provided:   Other plans: Will check BP every day at home and follow up in 1 month with the BP record. Will also bring his BP cuff so we can compare it to the one we have in clinic

## 2016-02-11 NOTE — Patient Instructions (Signed)
-   It was a pleasure seeing you today - Please check your BP revery day at home and record it and bring the BP cuff and the record to your next appointment - Please follow up with podiatry next Monday for your foot ulcer - Please follow up with Dr. Dwyane Dee for your diabetes

## 2016-02-12 DIAGNOSIS — N183 Chronic kidney disease, stage 3 (moderate): Secondary | ICD-10-CM | POA: Diagnosis not present

## 2016-02-12 DIAGNOSIS — E1122 Type 2 diabetes mellitus with diabetic chronic kidney disease: Secondary | ICD-10-CM | POA: Diagnosis not present

## 2016-02-12 DIAGNOSIS — E1142 Type 2 diabetes mellitus with diabetic polyneuropathy: Secondary | ICD-10-CM | POA: Diagnosis not present

## 2016-02-12 DIAGNOSIS — G894 Chronic pain syndrome: Secondary | ICD-10-CM | POA: Diagnosis not present

## 2016-02-12 DIAGNOSIS — E1165 Type 2 diabetes mellitus with hyperglycemia: Secondary | ICD-10-CM | POA: Diagnosis not present

## 2016-02-12 DIAGNOSIS — E11311 Type 2 diabetes mellitus with unspecified diabetic retinopathy with macular edema: Secondary | ICD-10-CM | POA: Diagnosis not present

## 2016-02-12 DIAGNOSIS — E11621 Type 2 diabetes mellitus with foot ulcer: Secondary | ICD-10-CM | POA: Diagnosis not present

## 2016-02-12 DIAGNOSIS — L97421 Non-pressure chronic ulcer of left heel and midfoot limited to breakdown of skin: Secondary | ICD-10-CM | POA: Diagnosis not present

## 2016-02-12 DIAGNOSIS — I129 Hypertensive chronic kidney disease with stage 1 through stage 4 chronic kidney disease, or unspecified chronic kidney disease: Secondary | ICD-10-CM | POA: Diagnosis not present

## 2016-02-12 DIAGNOSIS — I480 Paroxysmal atrial fibrillation: Secondary | ICD-10-CM | POA: Diagnosis not present

## 2016-02-12 DIAGNOSIS — Z89422 Acquired absence of other left toe(s): Secondary | ICD-10-CM | POA: Diagnosis not present

## 2016-02-12 LAB — FRUCTOSAMINE: FRUCTOSAMINE: 306 umol/L — AB (ref 0–285)

## 2016-02-14 ENCOUNTER — Ambulatory Visit: Payer: Medicare Other | Admitting: Endocrinology

## 2016-02-14 DIAGNOSIS — E1122 Type 2 diabetes mellitus with diabetic chronic kidney disease: Secondary | ICD-10-CM | POA: Diagnosis not present

## 2016-02-14 DIAGNOSIS — I480 Paroxysmal atrial fibrillation: Secondary | ICD-10-CM | POA: Diagnosis not present

## 2016-02-14 DIAGNOSIS — E1142 Type 2 diabetes mellitus with diabetic polyneuropathy: Secondary | ICD-10-CM | POA: Diagnosis not present

## 2016-02-14 DIAGNOSIS — N183 Chronic kidney disease, stage 3 (moderate): Secondary | ICD-10-CM | POA: Diagnosis not present

## 2016-02-14 DIAGNOSIS — E11621 Type 2 diabetes mellitus with foot ulcer: Secondary | ICD-10-CM | POA: Diagnosis not present

## 2016-02-14 DIAGNOSIS — E11311 Type 2 diabetes mellitus with unspecified diabetic retinopathy with macular edema: Secondary | ICD-10-CM | POA: Diagnosis not present

## 2016-02-14 DIAGNOSIS — G894 Chronic pain syndrome: Secondary | ICD-10-CM | POA: Diagnosis not present

## 2016-02-14 DIAGNOSIS — I129 Hypertensive chronic kidney disease with stage 1 through stage 4 chronic kidney disease, or unspecified chronic kidney disease: Secondary | ICD-10-CM | POA: Diagnosis not present

## 2016-02-14 DIAGNOSIS — E1165 Type 2 diabetes mellitus with hyperglycemia: Secondary | ICD-10-CM | POA: Diagnosis not present

## 2016-02-14 DIAGNOSIS — Z89422 Acquired absence of other left toe(s): Secondary | ICD-10-CM | POA: Diagnosis not present

## 2016-02-14 DIAGNOSIS — L97421 Non-pressure chronic ulcer of left heel and midfoot limited to breakdown of skin: Secondary | ICD-10-CM | POA: Diagnosis not present

## 2016-02-17 ENCOUNTER — Encounter: Payer: Self-pay | Admitting: Endocrinology

## 2016-02-17 ENCOUNTER — Ambulatory Visit (INDEPENDENT_AMBULATORY_CARE_PROVIDER_SITE_OTHER): Payer: Medicare Other | Admitting: Sports Medicine

## 2016-02-17 ENCOUNTER — Encounter: Payer: Self-pay | Admitting: Sports Medicine

## 2016-02-17 ENCOUNTER — Ambulatory Visit (INDEPENDENT_AMBULATORY_CARE_PROVIDER_SITE_OTHER): Payer: Medicare Other | Admitting: Endocrinology

## 2016-02-17 VITALS — BP 122/64 | HR 88 | Wt 370.0 lb

## 2016-02-17 DIAGNOSIS — IMO0002 Reserved for concepts with insufficient information to code with codable children: Secondary | ICD-10-CM

## 2016-02-17 DIAGNOSIS — E1165 Type 2 diabetes mellitus with hyperglycemia: Secondary | ICD-10-CM | POA: Diagnosis not present

## 2016-02-17 DIAGNOSIS — L89891 Pressure ulcer of other site, stage 1: Secondary | ICD-10-CM

## 2016-02-17 DIAGNOSIS — Z794 Long term (current) use of insulin: Secondary | ICD-10-CM | POA: Diagnosis not present

## 2016-02-17 DIAGNOSIS — E1140 Type 2 diabetes mellitus with diabetic neuropathy, unspecified: Secondary | ICD-10-CM

## 2016-02-17 DIAGNOSIS — Z89432 Acquired absence of left foot: Secondary | ICD-10-CM

## 2016-02-17 DIAGNOSIS — L97529 Non-pressure chronic ulcer of other part of left foot with unspecified severity: Principal | ICD-10-CM

## 2016-02-17 DIAGNOSIS — M79672 Pain in left foot: Secondary | ICD-10-CM

## 2016-02-17 DIAGNOSIS — E11621 Type 2 diabetes mellitus with foot ulcer: Secondary | ICD-10-CM

## 2016-02-17 NOTE — Progress Notes (Signed)
Patient ID: [name], male   DOB: [birth year], 65 y.o.   MRN: [id number]  Subjective: [name] is a 65 y.o. male patient seen in office for follow up evaluation of ulceration of the left foot. Patient has a history of diabetes and a blood glucose level today not recorded. A1c 10.  Patient has nursing helping to change the dressing 2-3x/week applying betadine to site. Patient is here for MRI results. Admits to decreased swelling and drainage. Denies nausea/fever/vomiting/chills/night sweats/shortness of breath/pain. Patient has no other pedal complaints at this time.  Patient Active Problem List   Diagnosis Date Noted  . Chronic pain syndrome 09/18/2015  . Chronic fatigue 04/16/2015  . Preventative health care 03/12/2015  . Family history of coronary artery disease in father 12/14/2014  . Sleep apnea-on C-pap 12/14/2014  . Acute osteomyelitis of toe of left foot ([place])   . Diabetic foot infection ([place]) 12/13/2014  . Foot ulcer, left ([place]) 12/13/2014  . Paroxysmal atrial fibrillation, new onset 12/13/2014  . Elevated alkaline phosphatase level 06/11/2014  . Constipation 05/31/2014  . Vitamin D deficiency 11/17/2013  . Frequent falls 06/30/2013  . Mild memory disturbance 07/27/2012  . Anemia 02/10/2012  . Hypertension 11/18/2011  . Obstructive sleep apnea 12/12/2009  . EDEMA 02/13/2009  . GERD 06/27/2008  . Diabetes type 2, uncontrolled ([place]) 12/28/2007  . DIABETIC MACULAR EDEMA 10/07/2007  . Dyslipidemia 08/15/2007  . BACKGROUND DIABETIC RETINOPATHY 08/15/2007  . ERECTILE DYSFUNCTION 05/31/2007  . HEARING LOSS, SENSORINEURAL, BILATERAL 01/03/2007  . Diabetic peripheral neuropathy associated with type 2 diabetes mellitus ([place]) 12/16/2006  . Chronic kidney disease, stage III (moderate) 12/01/2006  . Obesity BMI 46 08/23/2006  . STATUS, OTHER TOE(S) AMPUTATION 05/21/2006   Current Outpatient Prescriptions on File Prior to Visit  Medication Sig Dispense Refill  .  ACCU-CHEK FASTCLIX LANCETS MISC Check blood sugar 4 times a day before meals and bedtime dx code 250.02 insulin requiring 102 each 6  . acetaminophen (TYLENOL) 500 MG tablet Take 1 tablet (500 mg total) by mouth every 4 (four) hours as needed. Maximum Daily Dosage of 2000 mg 100 tablet 0  . albuterol (PROAIR HFA) 108 (90 Base) MCG/ACT inhaler Inhale 2 puffs into the lungs every 6 (six) hours as needed for wheezing or shortness of breath. 18 g 3  . atorvastatin (LIPITOR) 20 MG tablet Take 1 tablet (20 mg total) by mouth daily. 90 tablet 3  . BESIVANCE 0.6 % SUSP Place 1 drop into both eyes 4 (four) times daily.   12  . Blood Glucose Monitoring Suppl (ACCU-CHEK AVIVA PLUS) W/DEVICE KIT 1 each by Does not apply route 4 (four) times daily -  with meals and at bedtime. 1 kit 0  . cadexomer iodine (IODOSORB) 0.9 % gel Apply 1 application topically daily as needed for wound care. 40 g 0  . capsaicin (ZOSTRIX) 0.025 % cream Apply 1 application topically daily as needed (for pain).    . Cholecalciferol (VITAMIN D3) 2000 UNITS capsule Take 1 capsule (2,000 Units total) by mouth daily. 90 capsule 0  . docusate sodium (COLACE) 100 MG capsule Take 1 capsule (100 mg total) by mouth 2 (two) times daily. 60 capsule 0  . ELIQUIS 5 MG TABS tablet TAKE 1 TABLET(5 MG) BY MOUTH TWICE DAILY 180 tablet 1  . enalapril (VASOTEC) 20 MG tablet TAKE 2 TABLETS(40 MG) BY MOUTH DAILY 90 tablet 3  . furosemide (LASIX) 40 MG tablet TAKE 1 TABLET(40 MG) BY MOUTH TWICE DAILY 60 tablet  0  . glucose blood (ACCU-CHEK AVIVA PLUS) test strip USE TO CHECK BLOOD SUGAR FOUR TIMES DAILY BEFORE MEALS AND AT BEDTIME 150 each 11  . HYDROcodone-acetaminophen (NORCO/VICODIN) 5-325 MG tablet Take 1 and 1/2 tablets by mouth every 6 hours as needed for pain 140 tablet 0  . Insulin Glargine (TOUJEO SOLOSTAR) 300 UNIT/ML SOPN Inject 60-90 Units into the skin 2 (two) times daily. 45 pen 2  . Insulin Pen Needle 31G X 5 MM MISC Use for insulin injection 5  times a day. 150 each 5  . insulin regular human CONCENTRATED (HUMULIN R) 500 UNIT/ML kwikpen Inject 20 units at breakfast, 60 units at lunch and 70 units at dinner. 18 mL 3  . LUMIGAN 0.01 % SOLN Place 1 drop into both eyes at bedtime.     . metoprolol succinate (TOPROL-XL) 25 MG 24 hr tablet Take 1 tablet (25 mg total) by mouth daily. 90 tablet 3  . omeprazole (PRILOSEC) 20 MG capsule Take 1 capsule (20 mg total) by mouth daily. 90 capsule 0  . pregabalin (LYRICA) 25 MG capsule Take 1 capsule (25 mg total) by mouth 3 (three) times daily. 90 capsule 2  . promethazine (PHENERGAN) 25 MG tablet TAKE 1/2 TO 1 TABLET BY MOUTH EVERY 6 HOURS AS NEEDED FOR NAUSEA 30 tablet 0  . silver sulfADIAZINE (SILVADENE) 1 % cream Apply 1 application topically daily. 50 g 0  . SIMBRINZA 1-0.2 % SUSP Place 1 drop into both eyes 2 (two) times daily. Take as instructed by Dr. Katy Fitch.    Marland Kitchen VICTOZA 18 MG/3ML SOPN Inject 1.8 mg once daily at the same time 9 mL 3   No current facility-administered medications on file prior to visit.    Allergies  Allergen Reactions  . Vancomycin     REACTION: ARF    Recent Results (from the past 2160 hour(s))  Hemoglobin A1c     Status: Abnormal   Collection Time: 12/12/15 12:19 PM  Result Value Ref Range   Hgb A1c MFr Bld 10.0 (H) 4.6 - 6.5 %    Comment: Glycemic Control Guidelines for People with Diabetes:Non Diabetic:  <6%Goal of Therapy: <7%Additional Action Suggested:  >8%   Comprehensive metabolic panel     Status: Abnormal   Collection Time: 12/12/15 12:19 PM  Result Value Ref Range   Sodium 134 (L) 135 - 145 mEq/L   Potassium 4.2 3.5 - 5.1 mEq/L   Chloride 101 96 - 112 mEq/L   CO2 28 19 - 32 mEq/L   Glucose, Bld 289 (H) 70 - 99 mg/dL   BUN 25 (H) 6 - 23 mg/dL   Creatinine, Ser 1.77 (H) 0.40 - 1.50 mg/dL   Total Bilirubin 0.5 0.2 - 1.2 mg/dL   Alkaline Phosphatase 132 (H) 39 - 117 U/L   AST 22 0 - 37 U/L   ALT 23 0 - 53 U/L   Total Protein 7.3 6.0 - 8.3 g/dL    Albumin 3.8 3.5 - 5.2 g/dL   Calcium 9.4 8.4 - 10.5 mg/dL   GFR 49.78 (L) >60.00 mL/min  ToxAssure Select,+Antidepr,UR     Status: None   Collection Time: 01/01/16 11:09 AM  Result Value Ref Range   ToxASSURE Select (Plus) FINAL     Comment: ==================================================================== TOXASSURE SELECT,+ANTIDEPR,UR ==================================================================== Test                             Result  Flag       Units Drug Present and Declared for Prescription Verification   Hydrocodone                    122          EXPECTED   ng/mg creat   Hydromorphone                  158          EXPECTED   ng/mg creat   Norhydrocodone                 164          EXPECTED   ng/mg creat    Sources of hydrocodone include scheduled prescription    medications. Hydromorphone and norhydrocodone are expected    metabolites of hydrocodone. Hydromorphone is also available as a    scheduled prescription medication. ==================================================================== Test                      Result    Flag   Units      Ref Range   Creatinine              105              mg/dL      >=20 ==================================================================== Decl ared Medications:  The flagging and interpretation on this report are based on the  following declared medications.  Unexpected results may arise from  inaccuracies in the declared medications.  **Note: The testing scope of this panel includes these medications:  Hydrocodone 12-31-15 ==================================================================== For clinical consultation, please call [phone]. ====================================================================    PDF .   Comprehensive metabolic panel     Status: Abnormal   Collection Time: 02/11/16 10:11 AM  Result Value Ref Range   Sodium 136 135 - 145 mEq/L   Potassium 5.0 3.5 - 5.1 mEq/L   Chloride 105 96 -  112 mEq/L   CO2 24 19 - 32 mEq/L   Glucose, Bld 112 (H) 70 - 99 mg/dL   BUN 27 (H) 6 - 23 mg/dL   Creatinine, Ser 1.78 (H) 0.40 - 1.50 mg/dL   Total Bilirubin 0.4 0.2 - 1.2 mg/dL   Alkaline Phosphatase 143 (H) 39 - 117 U/L   AST 15 0 - 37 U/L   ALT 17 0 - 53 U/L   Total Protein 7.9 6.0 - 8.3 g/dL   Albumin 3.9 3.5 - 5.2 g/dL   Calcium 9.9 8.4 - 10.5 mg/dL   GFR 49.43 (L) >60.00 mL/min  Fructosamine     Status: Abnormal   Collection Time: 02/11/16 10:11 AM  Result Value Ref Range   Fructosamine 306 (H) 0 - 285 umol/L    Comment: Published reference interval for apparently healthy subjects between age 72 and 39 is 61 - 285 umol/L and in a poorly controlled diabetic population is 228 - 563 umol/L with a mean of 396 umol/L.     Objective: There were no vitals filed for this visit.  General: Patient is awake, alert, oriented x 3 and in no acute distress.  Dermatology: Skin is warm and dry bilateral with a now full thickness ulceration present  Left plantar forefoot. Ulceration measures 1cm x 2cm x 0.5cm  (last measurement same) extending into skin crease at amp site. There is a moderate keratotic border with a granular base. The ulceration does not probe to bone. There is mild malodor, no active drainage,  no erythema, decreasd swelling without warmth to dorsum of foot. No other acute signs of infection.   Vascular: Dorsalis Pedis pulse = 0/4 Bilateral,  Posterior Tibial pulse = 1/4 Bilateral,  Capillary Fill Time < 5 seconds at remaining toes   Neurologic: Protective sensation absent bilateral using SWMF.  Musculosketal: No Pain with palpation to ulcerated area on left. No pain with compression to calves bilateral. 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  MRI- No acute osteomyelitis. No abscess. Swelling.   Assessment and Plan:  Problem List Items Addressed This Visit    None    Visit Diagnoses    Diabetic ulcer of left foot associated with type 2 diabetes mellitus ([place])     -  Primary   Foot amputation status, left ([place])       Type 2 diabetes mellitus with diabetic neuropathy, unspecified long term insulin use status ([facility])       Foot pain, left         -Examined patient and discussed the progression of the wound and treatment alternatives. -MRI results reviewed - Excisionally dedbrided ulceration to healthy bleeding borders using a sterile chisel blade.  -Applied Iodosorb and dry sterile dressing and instructed patient continue with home nursing 3x per week consisting of betadine and dry sterile dressing. -Applied offloading pad to post op shoe to use daily to left foot -Advised patient to refrain from getting the ulceration wet and to limit activity to necessity -Advised patient to go to the ER or return to office if the wound worsens or if constitutional symptoms are present. -Patient to return to office 3-4 weeks for follow up ulceration care and evaluation or sooner if problems arise.  Landis Martins, DPM

## 2016-02-17 NOTE — Progress Notes (Signed)
Patient ID: [name], male   DOB: 08-22-1950, 65 y.o.   MRN: [id number]           Reason for Appointment: Follow-up for Type 2 Diabetes  Referring physician: Dareen Piano  History of Present Illness:          Date of diagnosis of type 2 diabetes mellitus: 2007       Background history:   He was diagnosed to have diabetes when he had an ulcer on his left third toe which led to amputation.  His glucose was apparently about 400 and he was started on insulin at that time He does not know if he has taken any oral hypoglycemic drugs in the past His A1c in 2016 has been consistently over 8%  Recent history:   INSULIN regimen is:  TOUJEO  60 in the morning and 100 in the evening HUMULIN U-500 insulin: 20 units in the morning, 50-60 at lunch and at supper Non-insulin hypoglycemic drugs the patient is taking are: Victoza 1.8 mg daily       On his initial consultation was started on Victoza and subsequently started on U-500 insulin in 2/17 along with Toujeo  Blood sugars have been difficult to control and A1c was last still high at 10% Although his fructosamine is not significantly high at 306 now it is slightly higher than 4 months ago  Current blood sugar patterns and problems identified:  He did not bring his monitor for download and not clear if his blood sugars are well controlled  FASTING blood sugars are reportedly mostly high although was only 112 in the lab  Not clear if his blood sugars are consistently high later in the day as previously there have been inconsistent and he does not recall his numbers..  Usually has variable readings except before lunch and sporadically high readings later in the day also in the past  He is asking about whether to take the Toujeo at bedtime even with blood sugars near 100; however he does take it regularly  Still has difficulty losing weight  Not able to exercise because of continued nonhealing ulcer  Although he is trying to take his  insulin 30 was before he is eating he takes it at variable times-0-60 minutes before meals  U-500 insulin: He was told to take at least 60 units at lunch and 70 at supper but he is taking mostly 50 units at lunch and 60 at supper since blood sugars are not as high at lunchtime  Side effects from medications have been: None  Compliance with the medical regimen: Fair  Hypoglycemia:   rarely acl with one reading of 62  Glucose monitoring:  done up to 3  times a day         Glucometer:  Accu-Chek     Blood Glucose readings by recall:  Mean values apply above for all meters except median for One Touch  PRE-MEAL Fasting Lunch Dinner Bedtime Overall  Glucose range: 120-200  upto 220 190   Mean/median:         Self-care:  Typical meal intake: Breakfast is sometimes fruit like a banana, usually eating eggs, Sometimes meat like sausage, ham and grits at lunch.   Has snacks with Fritos, fruits  Dinner at 6-7 pm He drinks grapefruit or V-8 juice and diet drinks                Dietician consultation: 12/16  Exercise: not walking Because of foot ulcer    Weight history: Previous range 260-410  Wt Readings from Last 3 Encounters:  02/17/16 (!) 370 lb (167.8 kg)  02/11/16 (!) 371 lb 1.6 oz (168.3 kg)  01/01/16 (!) 374 lb 3.2 oz (169.7 kg)    Glycemic control:   Lab Results  Component Value Date   HGBA1C 10.0 (H) 12/12/2015   HGBA1C 8.5 (H) 08/09/2015   HGBA1C 8.0 06/04/2015   Lab Results  Component Value Date   MICROALBUR 89.3 (H) 05/17/2015   LDLCALC 53 08/09/2015   CREATININE 1.78 (H) 02/11/2016   Lab on 02/11/2016  Component Date Value Ref Range Status  . Sodium 02/11/2016 136  135 - 145 mEq/L Final  . Potassium 02/11/2016 5.0  3.5 - 5.1 mEq/L Final  . Chloride 02/11/2016 105  96 - 112 mEq/L Final  . CO2 02/11/2016 24  19 - 32 mEq/L Final  . Glucose, Bld 02/11/2016 112* 70 - 99 mg/dL Final  . BUN 02/11/2016 27* 6 - 23 mg/dL Final  . Creatinine, Ser  02/11/2016 1.78* 0.40 - 1.50 mg/dL Final  . Total Bilirubin 02/11/2016 0.4  0.2 - 1.2 mg/dL Final  . Alkaline Phosphatase 02/11/2016 143* 39 - 117 U/L Final  . AST 02/11/2016 15  0 - 37 U/L Final  . ALT 02/11/2016 17  0 - 53 U/L Final  . Total Protein 02/11/2016 7.9  6.0 - 8.3 g/dL Final  . Albumin 02/11/2016 3.9  3.5 - 5.2 g/dL Final  . Calcium 02/11/2016 9.9  8.4 - 10.5 mg/dL Final  . GFR 02/11/2016 49.43* >60.00 mL/min Final  . Fructosamine 02/12/2016 306* 0 - 285 umol/L Final   Comment: Published reference interval for apparently healthy subjects between age 77 and 85 is 34 - 285 umol/L and in a poorly controlled diabetic population is 228 - 563 umol/L with a mean of 396 umol/L.          Medication List       Accurate as of 02/17/16  9:54 AM. Always use your most recent med list.          ACCU-CHEK AVIVA PLUS w/Device Kit 1 each by Does not apply route 4 (four) times daily -  with meals and at bedtime.   ACCU-CHEK FASTCLIX LANCETS Misc Check blood sugar 4 times a day before meals and bedtime dx code 250.02 insulin requiring   acetaminophen 500 MG tablet Commonly known as:  TYLENOL Take 1 tablet (500 mg total) by mouth every 4 (four) hours as needed. Maximum Daily Dosage of 2000 mg   albuterol 108 (90 Base) MCG/ACT inhaler Commonly known as:  PROAIR HFA Inhale 2 puffs into the lungs every 6 (six) hours as needed for wheezing or shortness of breath.   atorvastatin 20 MG tablet Commonly known as:  LIPITOR Take 1 tablet (20 mg total) by mouth daily.   BESIVANCE 0.6 % Susp Generic drug:  Besifloxacin HCl Place 1 drop into both eyes 4 (four) times daily.   cadexomer iodine 0.9 % gel Commonly known as:  IODOSORB Apply 1 application topically daily as needed for wound care.   capsaicin 0.025 % cream Commonly known as:  ZOSTRIX Apply 1 application topically daily as needed (for pain).   docusate sodium 100 MG capsule Commonly known as:  COLACE Take 1 capsule  (100 mg total) by mouth 2 (two) times daily.   ELIQUIS 5 MG Tabs tablet Generic drug:  apixaban TAKE 1 TABLET(5 MG) BY MOUTH TWICE  DAILY   enalapril 20 MG tablet Commonly known as:  VASOTEC TAKE 2 TABLETS(40 MG) BY MOUTH DAILY   furosemide 40 MG tablet Commonly known as:  LASIX TAKE 1 TABLET(40 MG) BY MOUTH TWICE DAILY   glucose blood test strip Commonly known as:  ACCU-CHEK AVIVA PLUS USE TO CHECK BLOOD SUGAR FOUR TIMES DAILY BEFORE MEALS AND AT BEDTIME   HYDROcodone-acetaminophen 5-325 MG tablet Commonly known as:  NORCO/VICODIN Take 1 and 1/2 tablets by mouth every 6 hours as needed for pain   Insulin Glargine 300 UNIT/ML Sopn Commonly known as:  TOUJEO SOLOSTAR Inject 60-90 Units into the skin 2 (two) times daily.   Insulin Pen Needle 31G X 5 MM Misc Use for insulin injection 5 times a day.   insulin regular human CONCENTRATED 500 UNIT/ML kwikpen Commonly known as:  HUMULIN R Inject 20 units at breakfast, 60 units at lunch and 70 units at dinner.   LUMIGAN 0.01 % Soln Generic drug:  bimatoprost Place 1 drop into both eyes at bedtime.   metoprolol succinate 25 MG 24 hr tablet Commonly known as:  TOPROL-XL Take 1 tablet (25 mg total) by mouth daily.   omeprazole 20 MG capsule Commonly known as:  PRILOSEC Take 1 capsule (20 mg total) by mouth daily.   pregabalin 25 MG capsule Commonly known as:  LYRICA Take 1 capsule (25 mg total) by mouth 3 (three) times daily.   promethazine 25 MG tablet Commonly known as:  PHENERGAN TAKE 1/2 TO 1 TABLET BY MOUTH EVERY 6 HOURS AS NEEDED FOR NAUSEA   silver sulfADIAZINE 1 % cream Commonly known as:  SILVADENE Apply 1 application topically daily.   SIMBRINZA 1-0.2 % Susp Generic drug:  Brinzolamide-Brimonidine Place 1 drop into both eyes 2 (two) times daily. Take as instructed by Dr. Katy Fitch.   VICTOZA 18 MG/3ML Sopn Generic drug:  Liraglutide Inject 1.8 mg once daily at the same time   Vitamin D3 2000 units  capsule Take 1 capsule (2,000 Units total) by mouth daily.       Allergies:  Allergies  Allergen Reactions  . Vancomycin     REACTION: ARF    Past Medical History:  Diagnosis Date  . Arthritis    "elbows & knees" (12/13/2014)  . Asthma   . CKD (chronic kidney disease), stage III   . DIABETIC FOOT ULCER 06/20/2009  . Edema, macular, due to secondary diabetes ([place])   . Erectile dysfunction   . GERD (gastroesophageal reflux disease)   . HEARING LOSS, SENSORINEURAL, BILATERAL 01/03/2007   Seen by ENT Dr. Orpah Greek D. Redmond Baseman 01/03/07  . Hemorrhoids   . History of echocardiogram    a. 04/2008 Echo: EF 50-55%, abnl LV relaxation, mildly dil LA.  Marland Kitchen Hyperlipidemia   . Hypertension   . Morbid obesity ([place])   . Neuropathy, lower extremity   . OSA on CPAP    Nocturnal polysomnogram on 01/21/2010 showed severe obstructive sleep apnea/hypopnea syndrome, AHI 74.1 per hour with non positional events, moderately loud snoring, and oxygen desaturation to a nadir of 78% on room air.  CPAP was successfully titrated to 17 CWP, AHI 1.1 per hour using a large ResMed Mirage Quattro full-face mask with heated humidifier. Bruxism was noted.   . Osteomyelitis of ankle and foot ([place])   . Retinopathy   . Type II diabetes mellitus ([facility])    w/complication NOS, type II    Past Surgical History:  Procedure Laterality Date  . AMPUTATION Left 12/15/2014   Procedure:  LEFT SECOND TOE AMPUTATION ;  Surgeon: Dorna Leitz, MD;  Location: [facility];  Service: Orthopedics;  Laterality: Left;  . 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 Kathalene Frames. Mayer Camel.    Family History  Problem Relation Age of Onset  . Diabetes Mother     also 2 siblings  . Heart attack Father 54  . Throat cancer Brother     Social History:  reports that he quit smoking about 6 years ago. His smoking use included Cigarettes. He has a 15.00 pack-year smoking history. He has  never used smokeless tobacco. He reports that he drinks about 6.6 oz of alcohol per week . He reports that he does not use drugs.    Review of Systems    Lipid management: taking Lipitor20 mg, followed by PCP    Lab Results  Component Value Date   CHOL 110 08/09/2015   HDL 27.40 (L) 08/09/2015   LDLCALC 53 08/09/2015   TRIG 149.0 08/09/2015   CHOLHDL 4 08/09/2015            Hypertension: Has had hypertension for a few years treated with enalapril,  taking 40 mg in the morning Also on Lasix and low-dose metoprolol  Continues to have mild chronic renal dysfunction  Last foot exam was in 10/16 Has been seen regularly by podiatrist for ulcer, still not completely healed, last treated with Septra    Physical Examination:  BP 122/64 (BP Location: Left Arm, Patient Position: Sitting, Cuff Size: Normal)   Pulse 88   Wt (!) 370 lb (167.8 kg)   SpO2 98%   BMI 42.76 kg/m       ASSESSMENT:  Diabetes type 2, uncontrolled with morbid obesity  See history of present illness for detailed discussion of current diabetes management, blood sugar patterns and problems identified He is  quite insulin resistant and taking about 300 units of insulin a day Requiring large doses of both bolus and basal insulin  As discussed above his blood sugars are difficult to assess because of his not bring his monitor Fructosamine is mildly increased and hopefully his A1c is much better on the next visit than 10% which he had previously Previously blood sugars have been somewhat labile  Currently appears to be having relatively high readings at suppertime and bedtime but not clear if they are consistent Although he reports high readings fasting it was only 112 fasting the lab  HYPERTENSION:  Controlled  Foot ulcer: Awaiting follow-up with podiatrist today   PLAN:     Increase U-500 to at least 60 units before lunch, discussed needing to get blood sugars down to around 150 or less before  suppertime and bedtime  He will bring his monitor on the next visit for download and detailed review  Consider follow-up with dietitian  He can increase his mealtime dose at least 5-10 units if blood sugars are high before supper  Again discussed timing of the insulin as 30 minutes or more before eating  Make sure he takes the same dose of Toujeo at night regardless of blood sugar levels but may have a bedtime snack  He may be a candidate for the U-500 insulin in the V-go pump, will consider on the next visit  Regular walking for exercise when he can  Follow-up in 6 weeks with A1c   Patient Instructions  Check blood sugars on waking up  Every 2 days  Also  check blood sugars about 2 hours after a meal and do this after different meals by rotation  Recommended blood sugar levels on waking up is 90-130 and about 2 hours after meal is 130-160  Please bring your blood sugar monitor to each visit, thank you  Take at least 60 U-500 at lunch  Supper 60-70 depending on what you eat and sugar level    Counseling time on subjects discussed above is over 50% of today's 25 minute visit    [name] 02/17/2016, 9:54 AM   Note: This office note was prepared with Estate agent. Any transcriptional errors that result from this process are unintentional.

## 2016-02-17 NOTE — Patient Instructions (Signed)
Check blood sugars on waking up  Every 2 days  Also check blood sugars about 2 hours after a meal and do this after different meals by rotation  Recommended blood sugar levels on waking up is 90-130 and about 2 hours after meal is 130-160  Please bring your blood sugar monitor to each visit, thank you  Take at least 60 U-500 at lunch  Supper 60-70 depending on what you eat and sugar level

## 2016-02-19 DIAGNOSIS — E1142 Type 2 diabetes mellitus with diabetic polyneuropathy: Secondary | ICD-10-CM | POA: Diagnosis not present

## 2016-02-19 DIAGNOSIS — E1165 Type 2 diabetes mellitus with hyperglycemia: Secondary | ICD-10-CM | POA: Diagnosis not present

## 2016-02-19 DIAGNOSIS — Z89422 Acquired absence of other left toe(s): Secondary | ICD-10-CM | POA: Diagnosis not present

## 2016-02-19 DIAGNOSIS — E1122 Type 2 diabetes mellitus with diabetic chronic kidney disease: Secondary | ICD-10-CM | POA: Diagnosis not present

## 2016-02-19 DIAGNOSIS — G894 Chronic pain syndrome: Secondary | ICD-10-CM | POA: Diagnosis not present

## 2016-02-19 DIAGNOSIS — I129 Hypertensive chronic kidney disease with stage 1 through stage 4 chronic kidney disease, or unspecified chronic kidney disease: Secondary | ICD-10-CM | POA: Diagnosis not present

## 2016-02-19 DIAGNOSIS — N183 Chronic kidney disease, stage 3 (moderate): Secondary | ICD-10-CM | POA: Diagnosis not present

## 2016-02-19 DIAGNOSIS — I480 Paroxysmal atrial fibrillation: Secondary | ICD-10-CM | POA: Diagnosis not present

## 2016-02-19 DIAGNOSIS — E11621 Type 2 diabetes mellitus with foot ulcer: Secondary | ICD-10-CM | POA: Diagnosis not present

## 2016-02-19 DIAGNOSIS — L97421 Non-pressure chronic ulcer of left heel and midfoot limited to breakdown of skin: Secondary | ICD-10-CM | POA: Diagnosis not present

## 2016-02-19 DIAGNOSIS — E11311 Type 2 diabetes mellitus with unspecified diabetic retinopathy with macular edema: Secondary | ICD-10-CM | POA: Diagnosis not present

## 2016-02-20 ENCOUNTER — Other Ambulatory Visit: Payer: Self-pay | Admitting: Internal Medicine

## 2016-02-21 DIAGNOSIS — I480 Paroxysmal atrial fibrillation: Secondary | ICD-10-CM | POA: Diagnosis not present

## 2016-02-21 DIAGNOSIS — N183 Chronic kidney disease, stage 3 (moderate): Secondary | ICD-10-CM | POA: Diagnosis not present

## 2016-02-21 DIAGNOSIS — Z89422 Acquired absence of other left toe(s): Secondary | ICD-10-CM | POA: Diagnosis not present

## 2016-02-21 DIAGNOSIS — E1122 Type 2 diabetes mellitus with diabetic chronic kidney disease: Secondary | ICD-10-CM | POA: Diagnosis not present

## 2016-02-21 DIAGNOSIS — I129 Hypertensive chronic kidney disease with stage 1 through stage 4 chronic kidney disease, or unspecified chronic kidney disease: Secondary | ICD-10-CM | POA: Diagnosis not present

## 2016-02-21 DIAGNOSIS — L97421 Non-pressure chronic ulcer of left heel and midfoot limited to breakdown of skin: Secondary | ICD-10-CM | POA: Diagnosis not present

## 2016-02-21 DIAGNOSIS — E11311 Type 2 diabetes mellitus with unspecified diabetic retinopathy with macular edema: Secondary | ICD-10-CM | POA: Diagnosis not present

## 2016-02-21 DIAGNOSIS — G894 Chronic pain syndrome: Secondary | ICD-10-CM | POA: Diagnosis not present

## 2016-02-21 DIAGNOSIS — E11621 Type 2 diabetes mellitus with foot ulcer: Secondary | ICD-10-CM | POA: Diagnosis not present

## 2016-02-21 DIAGNOSIS — E1165 Type 2 diabetes mellitus with hyperglycemia: Secondary | ICD-10-CM | POA: Diagnosis not present

## 2016-02-21 DIAGNOSIS — E1142 Type 2 diabetes mellitus with diabetic polyneuropathy: Secondary | ICD-10-CM | POA: Diagnosis not present

## 2016-02-22 ENCOUNTER — Other Ambulatory Visit: Payer: Self-pay | Admitting: Student in an Organized Health Care Education/Training Program

## 2016-02-22 ENCOUNTER — Other Ambulatory Visit: Payer: Self-pay | Admitting: Endocrinology

## 2016-02-22 ENCOUNTER — Other Ambulatory Visit: Payer: Self-pay | Admitting: Internal Medicine

## 2016-02-24 NOTE — Telephone Encounter (Signed)
Eliquis is duplicate request just needs one refilled and should have enough Prilosec was just written 02/20/16

## 2016-02-25 ENCOUNTER — Other Ambulatory Visit: Payer: Self-pay | Admitting: *Deleted

## 2016-02-25 DIAGNOSIS — L97421 Non-pressure chronic ulcer of left heel and midfoot limited to breakdown of skin: Secondary | ICD-10-CM | POA: Diagnosis not present

## 2016-02-25 DIAGNOSIS — E1169 Type 2 diabetes mellitus with other specified complication: Secondary | ICD-10-CM

## 2016-02-25 DIAGNOSIS — E1142 Type 2 diabetes mellitus with diabetic polyneuropathy: Secondary | ICD-10-CM | POA: Diagnosis not present

## 2016-02-25 DIAGNOSIS — I480 Paroxysmal atrial fibrillation: Secondary | ICD-10-CM | POA: Diagnosis not present

## 2016-02-25 DIAGNOSIS — I129 Hypertensive chronic kidney disease with stage 1 through stage 4 chronic kidney disease, or unspecified chronic kidney disease: Secondary | ICD-10-CM | POA: Diagnosis not present

## 2016-02-25 DIAGNOSIS — N183 Chronic kidney disease, stage 3 (moderate): Secondary | ICD-10-CM | POA: Diagnosis not present

## 2016-02-25 DIAGNOSIS — E1165 Type 2 diabetes mellitus with hyperglycemia: Secondary | ICD-10-CM | POA: Diagnosis not present

## 2016-02-25 DIAGNOSIS — E11621 Type 2 diabetes mellitus with foot ulcer: Secondary | ICD-10-CM | POA: Diagnosis not present

## 2016-02-25 DIAGNOSIS — E1122 Type 2 diabetes mellitus with diabetic chronic kidney disease: Secondary | ICD-10-CM | POA: Diagnosis not present

## 2016-02-25 DIAGNOSIS — G894 Chronic pain syndrome: Secondary | ICD-10-CM | POA: Diagnosis not present

## 2016-02-25 DIAGNOSIS — Z89422 Acquired absence of other left toe(s): Secondary | ICD-10-CM | POA: Diagnosis not present

## 2016-02-25 DIAGNOSIS — E11311 Type 2 diabetes mellitus with unspecified diabetic retinopathy with macular edema: Secondary | ICD-10-CM | POA: Diagnosis not present

## 2016-02-25 MED ORDER — INSULIN PEN NEEDLE 31G X 5 MM MISC: 5 refills | Status: DC

## 2016-02-26 ENCOUNTER — Other Ambulatory Visit: Payer: Self-pay | Admitting: *Deleted

## 2016-02-27 DIAGNOSIS — E1122 Type 2 diabetes mellitus with diabetic chronic kidney disease: Secondary | ICD-10-CM | POA: Diagnosis not present

## 2016-02-27 DIAGNOSIS — E11311 Type 2 diabetes mellitus with unspecified diabetic retinopathy with macular edema: Secondary | ICD-10-CM | POA: Diagnosis not present

## 2016-02-27 DIAGNOSIS — I480 Paroxysmal atrial fibrillation: Secondary | ICD-10-CM | POA: Diagnosis not present

## 2016-02-27 DIAGNOSIS — E1142 Type 2 diabetes mellitus with diabetic polyneuropathy: Secondary | ICD-10-CM | POA: Diagnosis not present

## 2016-02-27 DIAGNOSIS — E1165 Type 2 diabetes mellitus with hyperglycemia: Secondary | ICD-10-CM | POA: Diagnosis not present

## 2016-02-27 DIAGNOSIS — I129 Hypertensive chronic kidney disease with stage 1 through stage 4 chronic kidney disease, or unspecified chronic kidney disease: Secondary | ICD-10-CM | POA: Diagnosis not present

## 2016-02-27 DIAGNOSIS — E11621 Type 2 diabetes mellitus with foot ulcer: Secondary | ICD-10-CM | POA: Diagnosis not present

## 2016-02-27 DIAGNOSIS — L97421 Non-pressure chronic ulcer of left heel and midfoot limited to breakdown of skin: Secondary | ICD-10-CM | POA: Diagnosis not present

## 2016-02-27 DIAGNOSIS — G894 Chronic pain syndrome: Secondary | ICD-10-CM | POA: Diagnosis not present

## 2016-02-27 DIAGNOSIS — Z89422 Acquired absence of other left toe(s): Secondary | ICD-10-CM | POA: Diagnosis not present

## 2016-02-27 DIAGNOSIS — N183 Chronic kidney disease, stage 3 (moderate): Secondary | ICD-10-CM | POA: Diagnosis not present

## 2016-02-28 ENCOUNTER — Other Ambulatory Visit: Payer: Self-pay

## 2016-02-28 DIAGNOSIS — G894 Chronic pain syndrome: Secondary | ICD-10-CM

## 2016-02-28 NOTE — Telephone Encounter (Signed)
Requesting pain med to be filled.  

## 2016-02-28 NOTE — Telephone Encounter (Signed)
Last visit: 02/11/2016 Next appointment: 03/24/2016 Last UDS: 01/01/2016 Last written: 01/27/2016

## 2016-03-02 ENCOUNTER — Other Ambulatory Visit: Payer: Self-pay | Admitting: *Deleted

## 2016-03-02 DIAGNOSIS — E1122 Type 2 diabetes mellitus with diabetic chronic kidney disease: Secondary | ICD-10-CM | POA: Diagnosis not present

## 2016-03-02 DIAGNOSIS — G894 Chronic pain syndrome: Secondary | ICD-10-CM | POA: Diagnosis not present

## 2016-03-02 DIAGNOSIS — N183 Chronic kidney disease, stage 3 (moderate): Secondary | ICD-10-CM | POA: Diagnosis not present

## 2016-03-02 DIAGNOSIS — L97421 Non-pressure chronic ulcer of left heel and midfoot limited to breakdown of skin: Secondary | ICD-10-CM | POA: Diagnosis not present

## 2016-03-02 DIAGNOSIS — I480 Paroxysmal atrial fibrillation: Secondary | ICD-10-CM | POA: Diagnosis not present

## 2016-03-02 DIAGNOSIS — E1142 Type 2 diabetes mellitus with diabetic polyneuropathy: Secondary | ICD-10-CM | POA: Diagnosis not present

## 2016-03-02 DIAGNOSIS — I10 Essential (primary) hypertension: Secondary | ICD-10-CM

## 2016-03-02 DIAGNOSIS — E1165 Type 2 diabetes mellitus with hyperglycemia: Secondary | ICD-10-CM | POA: Diagnosis not present

## 2016-03-02 DIAGNOSIS — I129 Hypertensive chronic kidney disease with stage 1 through stage 4 chronic kidney disease, or unspecified chronic kidney disease: Secondary | ICD-10-CM | POA: Diagnosis not present

## 2016-03-02 DIAGNOSIS — E11621 Type 2 diabetes mellitus with foot ulcer: Secondary | ICD-10-CM | POA: Diagnosis not present

## 2016-03-02 DIAGNOSIS — E11311 Type 2 diabetes mellitus with unspecified diabetic retinopathy with macular edema: Secondary | ICD-10-CM | POA: Diagnosis not present

## 2016-03-02 DIAGNOSIS — Z89422 Acquired absence of other left toe(s): Secondary | ICD-10-CM | POA: Diagnosis not present

## 2016-03-02 MED ORDER — HYDROCODONE-ACETAMINOPHEN 5-325 MG PO TABS: ORAL_TABLET | ORAL | 0 refills | Status: DC

## 2016-03-02 MED ORDER — METOPROLOL SUCCINATE ER 25 MG PO TB24: 25.0000 mg | ORAL_TABLET | Freq: Every day | ORAL | 3 refills | Status: DC

## 2016-03-02 NOTE — Telephone Encounter (Signed)
Patient calling again about RX

## 2016-03-03 ENCOUNTER — Telehealth: Payer: Self-pay | Admitting: Endocrinology

## 2016-03-03 NOTE — Telephone Encounter (Signed)
Pt needs clarification of what meds he is to be taking

## 2016-03-05 NOTE — Telephone Encounter (Signed)
I contacted the pt and requested a call back via voicemail to discuss the clarification he is in need of on his medications.

## 2016-03-06 DIAGNOSIS — I480 Paroxysmal atrial fibrillation: Secondary | ICD-10-CM | POA: Diagnosis not present

## 2016-03-06 DIAGNOSIS — E11621 Type 2 diabetes mellitus with foot ulcer: Secondary | ICD-10-CM | POA: Diagnosis not present

## 2016-03-06 DIAGNOSIS — E1165 Type 2 diabetes mellitus with hyperglycemia: Secondary | ICD-10-CM | POA: Diagnosis not present

## 2016-03-06 DIAGNOSIS — E1142 Type 2 diabetes mellitus with diabetic polyneuropathy: Secondary | ICD-10-CM | POA: Diagnosis not present

## 2016-03-06 DIAGNOSIS — G894 Chronic pain syndrome: Secondary | ICD-10-CM | POA: Diagnosis not present

## 2016-03-06 DIAGNOSIS — E1122 Type 2 diabetes mellitus with diabetic chronic kidney disease: Secondary | ICD-10-CM | POA: Diagnosis not present

## 2016-03-06 DIAGNOSIS — I129 Hypertensive chronic kidney disease with stage 1 through stage 4 chronic kidney disease, or unspecified chronic kidney disease: Secondary | ICD-10-CM | POA: Diagnosis not present

## 2016-03-06 DIAGNOSIS — Z89422 Acquired absence of other left toe(s): Secondary | ICD-10-CM | POA: Diagnosis not present

## 2016-03-06 DIAGNOSIS — N183 Chronic kidney disease, stage 3 (moderate): Secondary | ICD-10-CM | POA: Diagnosis not present

## 2016-03-06 DIAGNOSIS — E11311 Type 2 diabetes mellitus with unspecified diabetic retinopathy with macular edema: Secondary | ICD-10-CM | POA: Diagnosis not present

## 2016-03-06 DIAGNOSIS — L97421 Non-pressure chronic ulcer of left heel and midfoot limited to breakdown of skin: Secondary | ICD-10-CM | POA: Diagnosis not present

## 2016-03-09 ENCOUNTER — Other Ambulatory Visit: Payer: Self-pay | Admitting: Internal Medicine

## 2016-03-10 ENCOUNTER — Ambulatory Visit (INDEPENDENT_AMBULATORY_CARE_PROVIDER_SITE_OTHER): Payer: Medicare Other | Admitting: Sports Medicine

## 2016-03-10 ENCOUNTER — Encounter: Payer: Self-pay | Admitting: Sports Medicine

## 2016-03-10 ENCOUNTER — Telehealth: Payer: Self-pay | Admitting: *Deleted

## 2016-03-10 VITALS — BP 118/66 | HR 79 | Resp 16

## 2016-03-10 DIAGNOSIS — E11621 Type 2 diabetes mellitus with foot ulcer: Secondary | ICD-10-CM

## 2016-03-10 DIAGNOSIS — IMO0002 Reserved for concepts with insufficient information to code with codable children: Secondary | ICD-10-CM

## 2016-03-10 DIAGNOSIS — L97529 Non-pressure chronic ulcer of other part of left foot with unspecified severity: Principal | ICD-10-CM

## 2016-03-10 DIAGNOSIS — Z89432 Acquired absence of left foot: Secondary | ICD-10-CM

## 2016-03-10 DIAGNOSIS — L89891 Pressure ulcer of other site, stage 1: Secondary | ICD-10-CM | POA: Diagnosis not present

## 2016-03-10 DIAGNOSIS — M79672 Pain in left foot: Secondary | ICD-10-CM

## 2016-03-10 DIAGNOSIS — E1140 Type 2 diabetes mellitus with diabetic neuropathy, unspecified: Secondary | ICD-10-CM

## 2016-03-10 NOTE — Telephone Encounter (Addendum)
-----   Message from Landis Martins, [place] sent at 03/10/2016  1:57 PM EDT ----- Regarding: Nursing wound care Change dressing to Prisma, covered with 4x4, abd, kerlix, and ace 3x per week. Discontinue betadine Thanks Dr Cannon Kettle. Left message informing Levada Dy, [name] at home of pt's new orders, and request a confirmation call back. 03/12/2016-Angela, RN - [facility] states she has some questions about pt's orders. Left message to call again. 04/01/2016-Left message informing Levada Dy, [place] [facility] to dress pt's ulceration site with Prisma 3 times a week. 04/01/2016-Crystal - [facility] states needs written orders for Prisma. Faxed to [phone].

## 2016-03-10 NOTE — Progress Notes (Signed)
Patient ID: [name], male   DOB: Jul 06, 1951, 65 y.o.   MRN: [id number]  Subjective: [name] is a 65 y.o. male patient seen in office for follow up evaluation of ulceration of the left foot. Patient has a history of diabetes and a blood glucose level today not recorded. A1c 10.  Patient has nursing helping to change the dressing 2-3x/week applying betadine to site. Patient is here for MRI results. Admits to decreased swelling and drainage. Denies nausea/fever/vomiting/chills/night sweats/shortness of breath/pain. Patient has no other pedal complaints at this time.  Patient Active Problem List   Diagnosis Date Noted  . Chronic pain syndrome 09/18/2015  . Chronic fatigue 04/16/2015  . Preventative health care 03/12/2015  . Family history of coronary artery disease in father 12/14/2014  . Sleep apnea-on C-pap 12/14/2014  . Acute osteomyelitis of toe of left foot ([place])   . Diabetic foot infection ([place]) 12/13/2014  . Foot ulcer, left ([place]) 12/13/2014  . Paroxysmal atrial fibrillation, new onset 12/13/2014  . Elevated alkaline phosphatase level 06/11/2014  . Constipation 05/31/2014  . Vitamin D deficiency 11/17/2013  . Frequent falls 06/30/2013  . Mild memory disturbance 07/27/2012  . Anemia 02/10/2012  . Hypertension 11/18/2011  . Obstructive sleep apnea 12/12/2009  . EDEMA 02/13/2009  . GERD 06/27/2008  . Diabetes type 2, uncontrolled ([place]) 12/28/2007  . DIABETIC MACULAR EDEMA 10/07/2007  . Dyslipidemia 08/15/2007  . BACKGROUND DIABETIC RETINOPATHY 08/15/2007  . ERECTILE DYSFUNCTION 05/31/2007  . HEARING LOSS, SENSORINEURAL, BILATERAL 01/03/2007  . Diabetic peripheral neuropathy associated with type 2 diabetes mellitus ([place]) 12/16/2006  . Chronic kidney disease, stage III (moderate) 12/01/2006  . Obesity BMI 46 08/23/2006  . STATUS, OTHER TOE(S) AMPUTATION 05/21/2006   Current Outpatient Prescriptions on File Prior to Visit  Medication Sig Dispense Refill  .  ACCU-CHEK AVIVA PLUS test strip USE TO CHECK BLOOD SUGAR FOUR TIMES DAILY DAILY BEFORE MEALS AND AT BEDTIME 150 each 0  . ACCU-CHEK FASTCLIX LANCETS MISC Check blood sugar 4 times a day before meals and bedtime dx code 250.02 insulin requiring 102 each 6  . acetaminophen (TYLENOL) 500 MG tablet Take 1 tablet (500 mg total) by mouth every 4 (four) hours as needed. Maximum Daily Dosage of 2000 mg 100 tablet 0  . albuterol (PROAIR HFA) 108 (90 Base) MCG/ACT inhaler Inhale 2 puffs into the lungs every 6 (six) hours as needed for wheezing or shortness of breath. 18 g 3  . atorvastatin (LIPITOR) 20 MG tablet TAKE 1 TABLET(20 MG) BY MOUTH DAILY 90 tablet 3  . BESIVANCE 0.6 % SUSP Place 1 drop into both eyes 4 (four) times daily.   12  . Blood Glucose Monitoring Suppl (ACCU-CHEK AVIVA PLUS) W/DEVICE KIT 1 each by Does not apply route 4 (four) times daily -  with meals and at bedtime. 1 kit 0  . cadexomer iodine (IODOSORB) 0.9 % gel Apply 1 application topically daily as needed for wound care. 40 g 0  . capsaicin (ZOSTRIX) 0.025 % cream Apply 1 application topically daily as needed (for pain).    . Cholecalciferol (VITAMIN D3) 2000 UNITS capsule Take 1 capsule (2,000 Units total) by mouth daily. 90 capsule 0  . docusate sodium (COLACE) 100 MG capsule Take 1 capsule (100 mg total) by mouth 2 (two) times daily. 60 capsule 0  . ELIQUIS 5 MG TABS tablet TAKE 1 TABLET(5 MG) BY MOUTH TWICE DAILY 180 tablet 0  . enalapril (VASOTEC) 20 MG tablet TAKE 2 TABLETS(40 MG) BY  MOUTH DAILY 90 tablet 3  . furosemide (LASIX) 40 MG tablet TAKE 1 TABLET(40 MG) BY MOUTH TWICE DAILY 60 tablet 0  . HYDROcodone-acetaminophen (NORCO/VICODIN) 5-325 MG tablet Take 1 and 1/2 tablets by mouth every 6 hours as needed for pain 140 tablet 0  . Insulin Glargine (TOUJEO SOLOSTAR) 300 UNIT/ML SOPN Inject 60-90 Units into the skin 2 (two) times daily. 45 pen 2  . Insulin Pen Needle 31G X 5 MM MISC Use for insulin injection 5 times a day. 150  each 5  . insulin regular human CONCENTRATED (HUMULIN R) 500 UNIT/ML kwikpen Inject 20 units at breakfast, 60 units at lunch and 70 units at dinner. 18 mL 3  . LUMIGAN 0.01 % SOLN Place 1 drop into both eyes at bedtime.     . metoprolol succinate (TOPROL-XL) 25 MG 24 hr tablet Take 1 tablet (25 mg total) by mouth daily. 90 tablet 3  . omeprazole (PRILOSEC) 20 MG capsule TAKE 1 CAPSULE(20 MG) BY MOUTH DAILY 90 capsule 0  . pregabalin (LYRICA) 25 MG capsule Take 1 capsule (25 mg total) by mouth 3 (three) times daily. 90 capsule 2  . silver sulfADIAZINE (SILVADENE) 1 % cream Apply 1 application topically daily. 50 g 0  . SIMBRINZA 1-0.2 % SUSP Place 1 drop into both eyes 2 (two) times daily. Take as instructed by Dr. Katy Fitch.    Marland Kitchen VICTOZA 18 MG/3ML SOPN Inject 1.8 mg once daily at the same time 9 mL 3  . VICTOZA 18 MG/3ML SOPN INJECT 1.8 MG ONCE DAILY AT THE SAME TIME 9 mL 0  . promethazine (PHENERGAN) 25 MG tablet TAKE 1/2 TO 1 TABLET BY MOUTH EVERY 6 HOURS AS NEEDED FOR NAUSEA 30 tablet 0   No current facility-administered medications on file prior to visit.    Allergies  Allergen Reactions  . Vancomycin     REACTION: ARF    Recent Results (from the past 2160 hour(s))  Hemoglobin A1c     Status: Abnormal   Collection Time: 12/12/15 12:19 PM  Result Value Ref Range   Hgb A1c MFr Bld 10.0 (H) 4.6 - 6.5 %    Comment: Glycemic Control Guidelines for People with Diabetes:Non Diabetic:  <6%Goal of Therapy: <7%Additional Action Suggested:  >8%   Comprehensive metabolic panel     Status: Abnormal   Collection Time: 12/12/15 12:19 PM  Result Value Ref Range   Sodium 134 (L) 135 - 145 mEq/L   Potassium 4.2 3.5 - 5.1 mEq/L   Chloride 101 96 - 112 mEq/L   CO2 28 19 - 32 mEq/L   Glucose, Bld 289 (H) 70 - 99 mg/dL   BUN 25 (H) 6 - 23 mg/dL   Creatinine, Ser 1.77 (H) 0.40 - 1.50 mg/dL   Total Bilirubin 0.5 0.2 - 1.2 mg/dL   Alkaline Phosphatase 132 (H) 39 - 117 U/L   AST 22 0 - 37 U/L   ALT 23 0  - 53 U/L   Total Protein 7.3 6.0 - 8.3 g/dL   Albumin 3.8 3.5 - 5.2 g/dL   Calcium 9.4 8.4 - 10.5 mg/dL   GFR 49.78 (L) >60.00 mL/min  ToxAssure Select,+Antidepr,UR     Status: None   Collection Time: 01/01/16 11:09 AM  Result Value Ref Range   ToxASSURE Select (Plus) FINAL     Comment: ==================================================================== TOXASSURE SELECT,+ANTIDEPR,UR ==================================================================== Test  Result       Flag       Units Drug Present and Declared for Prescription Verification   Hydrocodone                    122          EXPECTED   ng/mg creat   Hydromorphone                  158          EXPECTED   ng/mg creat   Norhydrocodone                 164          EXPECTED   ng/mg creat    Sources of hydrocodone include scheduled prescription    medications. Hydromorphone and norhydrocodone are expected    metabolites of hydrocodone. Hydromorphone is also available as a    scheduled prescription medication. ==================================================================== Test                      Result    Flag   Units      Ref Range   Creatinine              105              mg/dL      >=20 ==================================================================== Decl ared Medications:  The flagging and interpretation on this report are based on the  following declared medications.  Unexpected results may arise from  inaccuracies in the declared medications.  **Note: The testing scope of this panel includes these medications:  Hydrocodone 12-31-15 ==================================================================== For clinical consultation, please call [phone]. ====================================================================    PDF .   Comprehensive metabolic panel     Status: Abnormal   Collection Time: 02/11/16 10:11 AM  Result Value Ref Range   Sodium 136 135 - 145 mEq/L    Potassium 5.0 3.5 - 5.1 mEq/L   Chloride 105 96 - 112 mEq/L   CO2 24 19 - 32 mEq/L   Glucose, Bld 112 (H) 70 - 99 mg/dL   BUN 27 (H) 6 - 23 mg/dL   Creatinine, Ser 1.78 (H) 0.40 - 1.50 mg/dL   Total Bilirubin 0.4 0.2 - 1.2 mg/dL   Alkaline Phosphatase 143 (H) 39 - 117 U/L   AST 15 0 - 37 U/L   ALT 17 0 - 53 U/L   Total Protein 7.9 6.0 - 8.3 g/dL   Albumin 3.9 3.5 - 5.2 g/dL   Calcium 9.9 8.4 - 10.5 mg/dL   GFR 49.43 (L) >60.00 mL/min  Fructosamine     Status: Abnormal   Collection Time: 02/11/16 10:11 AM  Result Value Ref Range   Fructosamine 306 (H) 0 - 285 umol/L    Comment: Published reference interval for apparently healthy subjects between age 5 and 67 is 50 - 285 umol/L and in a poorly controlled diabetic population is 228 - 563 umol/L with a mean of 396 umol/L.     Objective: There were no vitals filed for this visit.  General: Patient is awake, alert, oriented x 3 and in no acute distress.  Dermatology: Skin is warm and dry bilateral with a now full thickness ulceration present Left plantar forefoot. Ulceration measures 1cm x 2cm x 0.5cm  (last measurement same) extending into skin crease at amp site. There is a moderate keratotic border with a granular base. The ulceration does not probe to bone. There  is mild malodor, no active drainage, no erythema, decreasd swelling without warmth to dorsum of foot. No other acute signs of infection.   Vascular: Dorsalis Pedis pulse = 0/4 Bilateral,  Posterior Tibial pulse = 1/4 Bilateral,  Capillary Fill Time < 5 seconds at remaining toes   Neurologic: Protective sensation absent bilateral using SWMF.  Musculosketal: No Pain with palpation to ulcerated area on left. No pain with compression to calves bilateral. 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  Assessment and Plan:  Problem List Items Addressed This Visit    None    Visit Diagnoses    Diabetic ulcer of left foot associated with type 2 diabetes mellitus ([place])    -   Primary   Foot amputation status, left ([place])       Type 2 diabetes mellitus with diabetic neuropathy, unspecified long term insulin use status (HCC)       Foot pain, left         -Examined patient and discussed the progression of the wound and treatment alternatives. - Excisionally dedbrided ulceration to healthy bleeding borders using a sterile chisel blade.  -Applied Iodosorb and dry sterile dressing and instructed patient continue with home nursing 3x per week consisting of PRISMA and dry sterile dressing. -Applied offloading pad to post op shoe to use daily to left foot -Advised patient to refrain from getting the ulceration wet and to limit activity to necessity -Advised patient to go to the ER or return to office if the wound worsens or if constitutional symptoms are present. -Patient to return to office 3-4 weeks for follow up ulceration care and evaluation or sooner if problems arise.  Landis Martins, DPM

## 2016-03-11 DIAGNOSIS — E11621 Type 2 diabetes mellitus with foot ulcer: Secondary | ICD-10-CM | POA: Diagnosis not present

## 2016-03-11 DIAGNOSIS — I480 Paroxysmal atrial fibrillation: Secondary | ICD-10-CM | POA: Diagnosis not present

## 2016-03-11 DIAGNOSIS — E1165 Type 2 diabetes mellitus with hyperglycemia: Secondary | ICD-10-CM | POA: Diagnosis not present

## 2016-03-11 DIAGNOSIS — Z89422 Acquired absence of other left toe(s): Secondary | ICD-10-CM | POA: Diagnosis not present

## 2016-03-11 DIAGNOSIS — L97421 Non-pressure chronic ulcer of left heel and midfoot limited to breakdown of skin: Secondary | ICD-10-CM | POA: Diagnosis not present

## 2016-03-11 DIAGNOSIS — I129 Hypertensive chronic kidney disease with stage 1 through stage 4 chronic kidney disease, or unspecified chronic kidney disease: Secondary | ICD-10-CM | POA: Diagnosis not present

## 2016-03-11 DIAGNOSIS — E1122 Type 2 diabetes mellitus with diabetic chronic kidney disease: Secondary | ICD-10-CM | POA: Diagnosis not present

## 2016-03-11 DIAGNOSIS — E11311 Type 2 diabetes mellitus with unspecified diabetic retinopathy with macular edema: Secondary | ICD-10-CM | POA: Diagnosis not present

## 2016-03-11 DIAGNOSIS — N183 Chronic kidney disease, stage 3 (moderate): Secondary | ICD-10-CM | POA: Diagnosis not present

## 2016-03-11 DIAGNOSIS — E1142 Type 2 diabetes mellitus with diabetic polyneuropathy: Secondary | ICD-10-CM | POA: Diagnosis not present

## 2016-03-11 DIAGNOSIS — G894 Chronic pain syndrome: Secondary | ICD-10-CM | POA: Diagnosis not present

## 2016-03-13 DIAGNOSIS — L97421 Non-pressure chronic ulcer of left heel and midfoot limited to breakdown of skin: Secondary | ICD-10-CM | POA: Diagnosis not present

## 2016-03-13 DIAGNOSIS — G4733 Obstructive sleep apnea (adult) (pediatric): Secondary | ICD-10-CM | POA: Diagnosis not present

## 2016-03-13 DIAGNOSIS — I129 Hypertensive chronic kidney disease with stage 1 through stage 4 chronic kidney disease, or unspecified chronic kidney disease: Secondary | ICD-10-CM | POA: Diagnosis not present

## 2016-03-13 DIAGNOSIS — E1122 Type 2 diabetes mellitus with diabetic chronic kidney disease: Secondary | ICD-10-CM | POA: Diagnosis not present

## 2016-03-13 DIAGNOSIS — E1142 Type 2 diabetes mellitus with diabetic polyneuropathy: Secondary | ICD-10-CM | POA: Diagnosis not present

## 2016-03-13 DIAGNOSIS — E11621 Type 2 diabetes mellitus with foot ulcer: Secondary | ICD-10-CM | POA: Diagnosis not present

## 2016-03-13 DIAGNOSIS — I480 Paroxysmal atrial fibrillation: Secondary | ICD-10-CM | POA: Diagnosis not present

## 2016-03-13 DIAGNOSIS — G894 Chronic pain syndrome: Secondary | ICD-10-CM | POA: Diagnosis not present

## 2016-03-13 DIAGNOSIS — N183 Chronic kidney disease, stage 3 (moderate): Secondary | ICD-10-CM | POA: Diagnosis not present

## 2016-03-13 DIAGNOSIS — E1165 Type 2 diabetes mellitus with hyperglycemia: Secondary | ICD-10-CM | POA: Diagnosis not present

## 2016-03-13 DIAGNOSIS — E11311 Type 2 diabetes mellitus with unspecified diabetic retinopathy with macular edema: Secondary | ICD-10-CM | POA: Diagnosis not present

## 2016-03-13 DIAGNOSIS — Z89422 Acquired absence of other left toe(s): Secondary | ICD-10-CM | POA: Diagnosis not present

## 2016-03-17 DIAGNOSIS — L97421 Non-pressure chronic ulcer of left heel and midfoot limited to breakdown of skin: Secondary | ICD-10-CM | POA: Diagnosis not present

## 2016-03-17 DIAGNOSIS — N183 Chronic kidney disease, stage 3 (moderate): Secondary | ICD-10-CM | POA: Diagnosis not present

## 2016-03-17 DIAGNOSIS — E11621 Type 2 diabetes mellitus with foot ulcer: Secondary | ICD-10-CM | POA: Diagnosis not present

## 2016-03-17 DIAGNOSIS — E1122 Type 2 diabetes mellitus with diabetic chronic kidney disease: Secondary | ICD-10-CM | POA: Diagnosis not present

## 2016-03-17 DIAGNOSIS — G894 Chronic pain syndrome: Secondary | ICD-10-CM | POA: Diagnosis not present

## 2016-03-17 DIAGNOSIS — E1165 Type 2 diabetes mellitus with hyperglycemia: Secondary | ICD-10-CM | POA: Diagnosis not present

## 2016-03-17 DIAGNOSIS — E11311 Type 2 diabetes mellitus with unspecified diabetic retinopathy with macular edema: Secondary | ICD-10-CM | POA: Diagnosis not present

## 2016-03-17 DIAGNOSIS — I129 Hypertensive chronic kidney disease with stage 1 through stage 4 chronic kidney disease, or unspecified chronic kidney disease: Secondary | ICD-10-CM | POA: Diagnosis not present

## 2016-03-17 DIAGNOSIS — Z7901 Long term (current) use of anticoagulants: Secondary | ICD-10-CM | POA: Diagnosis not present

## 2016-03-17 DIAGNOSIS — I480 Paroxysmal atrial fibrillation: Secondary | ICD-10-CM | POA: Diagnosis not present

## 2016-03-17 DIAGNOSIS — E1142 Type 2 diabetes mellitus with diabetic polyneuropathy: Secondary | ICD-10-CM | POA: Diagnosis not present

## 2016-03-19 DIAGNOSIS — E1122 Type 2 diabetes mellitus with diabetic chronic kidney disease: Secondary | ICD-10-CM | POA: Diagnosis not present

## 2016-03-19 DIAGNOSIS — I129 Hypertensive chronic kidney disease with stage 1 through stage 4 chronic kidney disease, or unspecified chronic kidney disease: Secondary | ICD-10-CM | POA: Diagnosis not present

## 2016-03-19 DIAGNOSIS — E11311 Type 2 diabetes mellitus with unspecified diabetic retinopathy with macular edema: Secondary | ICD-10-CM | POA: Diagnosis not present

## 2016-03-19 DIAGNOSIS — G894 Chronic pain syndrome: Secondary | ICD-10-CM | POA: Diagnosis not present

## 2016-03-19 DIAGNOSIS — Z7901 Long term (current) use of anticoagulants: Secondary | ICD-10-CM | POA: Diagnosis not present

## 2016-03-19 DIAGNOSIS — E11621 Type 2 diabetes mellitus with foot ulcer: Secondary | ICD-10-CM | POA: Diagnosis not present

## 2016-03-19 DIAGNOSIS — E1165 Type 2 diabetes mellitus with hyperglycemia: Secondary | ICD-10-CM | POA: Diagnosis not present

## 2016-03-19 DIAGNOSIS — E1142 Type 2 diabetes mellitus with diabetic polyneuropathy: Secondary | ICD-10-CM | POA: Diagnosis not present

## 2016-03-19 DIAGNOSIS — I480 Paroxysmal atrial fibrillation: Secondary | ICD-10-CM | POA: Diagnosis not present

## 2016-03-19 DIAGNOSIS — L97421 Non-pressure chronic ulcer of left heel and midfoot limited to breakdown of skin: Secondary | ICD-10-CM | POA: Diagnosis not present

## 2016-03-19 DIAGNOSIS — N183 Chronic kidney disease, stage 3 (moderate): Secondary | ICD-10-CM | POA: Diagnosis not present

## 2016-03-20 DIAGNOSIS — E1122 Type 2 diabetes mellitus with diabetic chronic kidney disease: Secondary | ICD-10-CM | POA: Diagnosis not present

## 2016-03-20 DIAGNOSIS — I129 Hypertensive chronic kidney disease with stage 1 through stage 4 chronic kidney disease, or unspecified chronic kidney disease: Secondary | ICD-10-CM | POA: Diagnosis not present

## 2016-03-20 DIAGNOSIS — E1165 Type 2 diabetes mellitus with hyperglycemia: Secondary | ICD-10-CM | POA: Diagnosis not present

## 2016-03-20 DIAGNOSIS — E11311 Type 2 diabetes mellitus with unspecified diabetic retinopathy with macular edema: Secondary | ICD-10-CM | POA: Diagnosis not present

## 2016-03-20 DIAGNOSIS — L97421 Non-pressure chronic ulcer of left heel and midfoot limited to breakdown of skin: Secondary | ICD-10-CM | POA: Diagnosis not present

## 2016-03-20 DIAGNOSIS — G894 Chronic pain syndrome: Secondary | ICD-10-CM | POA: Diagnosis not present

## 2016-03-20 DIAGNOSIS — Z7901 Long term (current) use of anticoagulants: Secondary | ICD-10-CM | POA: Diagnosis not present

## 2016-03-20 DIAGNOSIS — E1142 Type 2 diabetes mellitus with diabetic polyneuropathy: Secondary | ICD-10-CM | POA: Diagnosis not present

## 2016-03-20 DIAGNOSIS — E11621 Type 2 diabetes mellitus with foot ulcer: Secondary | ICD-10-CM | POA: Diagnosis not present

## 2016-03-20 DIAGNOSIS — N183 Chronic kidney disease, stage 3 (moderate): Secondary | ICD-10-CM | POA: Diagnosis not present

## 2016-03-20 DIAGNOSIS — I480 Paroxysmal atrial fibrillation: Secondary | ICD-10-CM | POA: Diagnosis not present

## 2016-03-23 DIAGNOSIS — E11621 Type 2 diabetes mellitus with foot ulcer: Secondary | ICD-10-CM | POA: Diagnosis not present

## 2016-03-23 DIAGNOSIS — L97421 Non-pressure chronic ulcer of left heel and midfoot limited to breakdown of skin: Secondary | ICD-10-CM | POA: Diagnosis not present

## 2016-03-23 DIAGNOSIS — E1122 Type 2 diabetes mellitus with diabetic chronic kidney disease: Secondary | ICD-10-CM | POA: Diagnosis not present

## 2016-03-23 DIAGNOSIS — Z7901 Long term (current) use of anticoagulants: Secondary | ICD-10-CM | POA: Diagnosis not present

## 2016-03-23 DIAGNOSIS — I480 Paroxysmal atrial fibrillation: Secondary | ICD-10-CM | POA: Diagnosis not present

## 2016-03-23 DIAGNOSIS — E11311 Type 2 diabetes mellitus with unspecified diabetic retinopathy with macular edema: Secondary | ICD-10-CM | POA: Diagnosis not present

## 2016-03-23 DIAGNOSIS — N183 Chronic kidney disease, stage 3 (moderate): Secondary | ICD-10-CM | POA: Diagnosis not present

## 2016-03-23 DIAGNOSIS — E1142 Type 2 diabetes mellitus with diabetic polyneuropathy: Secondary | ICD-10-CM | POA: Diagnosis not present

## 2016-03-23 DIAGNOSIS — G894 Chronic pain syndrome: Secondary | ICD-10-CM | POA: Diagnosis not present

## 2016-03-23 DIAGNOSIS — E1165 Type 2 diabetes mellitus with hyperglycemia: Secondary | ICD-10-CM | POA: Diagnosis not present

## 2016-03-23 DIAGNOSIS — I129 Hypertensive chronic kidney disease with stage 1 through stage 4 chronic kidney disease, or unspecified chronic kidney disease: Secondary | ICD-10-CM | POA: Diagnosis not present

## 2016-03-24 ENCOUNTER — Encounter: Payer: Medicare Other | Admitting: Internal Medicine

## 2016-03-25 ENCOUNTER — Other Ambulatory Visit: Payer: Medicare Other

## 2016-03-25 ENCOUNTER — Other Ambulatory Visit: Payer: Self-pay

## 2016-03-25 ENCOUNTER — Other Ambulatory Visit: Payer: Self-pay | Admitting: Internal Medicine

## 2016-03-25 DIAGNOSIS — N183 Chronic kidney disease, stage 3 (moderate): Secondary | ICD-10-CM | POA: Diagnosis not present

## 2016-03-25 DIAGNOSIS — L97421 Non-pressure chronic ulcer of left heel and midfoot limited to breakdown of skin: Secondary | ICD-10-CM | POA: Diagnosis not present

## 2016-03-25 DIAGNOSIS — E1142 Type 2 diabetes mellitus with diabetic polyneuropathy: Secondary | ICD-10-CM | POA: Diagnosis not present

## 2016-03-25 DIAGNOSIS — Z7901 Long term (current) use of anticoagulants: Secondary | ICD-10-CM | POA: Diagnosis not present

## 2016-03-25 DIAGNOSIS — E11311 Type 2 diabetes mellitus with unspecified diabetic retinopathy with macular edema: Secondary | ICD-10-CM | POA: Diagnosis not present

## 2016-03-25 DIAGNOSIS — E1122 Type 2 diabetes mellitus with diabetic chronic kidney disease: Secondary | ICD-10-CM | POA: Diagnosis not present

## 2016-03-25 DIAGNOSIS — E11621 Type 2 diabetes mellitus with foot ulcer: Secondary | ICD-10-CM | POA: Diagnosis not present

## 2016-03-25 DIAGNOSIS — G894 Chronic pain syndrome: Secondary | ICD-10-CM | POA: Diagnosis not present

## 2016-03-25 DIAGNOSIS — E1165 Type 2 diabetes mellitus with hyperglycemia: Secondary | ICD-10-CM | POA: Diagnosis not present

## 2016-03-25 DIAGNOSIS — I480 Paroxysmal atrial fibrillation: Secondary | ICD-10-CM | POA: Diagnosis not present

## 2016-03-25 DIAGNOSIS — I129 Hypertensive chronic kidney disease with stage 1 through stage 4 chronic kidney disease, or unspecified chronic kidney disease: Secondary | ICD-10-CM | POA: Diagnosis not present

## 2016-03-25 NOTE — Patient Outreach (Signed)
[facility]) Care Management  03/25/2016  [name] December 31, 1950 [id number]     Telephone Screen  Referral Date: 03/24/16 Referral Source: MD office(Dr. Dareen Piano) Referral Reason: " DM, HTN, medication adherence, chronic disease management"    Incoming call from patient returning RN CM call. Screening completed.   Social: Patient states he resides in his home along with his spouse. He reports that he is independent with ADLs/IADLs. Last fall was a little over 6 months ago. Patient has a cane that he uses at times. He reports that he uses [employer] for transportation.  Conditions: PMH includes DM, sleep apnea, PAF, frequent falls, HTN, anemia, GERD, CKD stage 3, toe amputations, mild memory disturbance and hearing loss. Patient reports he is monitoring cbgs 3-4x/day. CBG this am was 111. Patient admits that cbgs fluctuate and vary. He reports he does not always adhere to diet regimen. Per records last A1C was 10.0(June 2017). Patient needs further education and support in managing chronic health conditions.He does have BP monitor in the home and keeps daily BP log. He states BP runs fairly well.   Medications: patient report taking less than 23meds. He denies any issues with affordability and/or managing meds.  Appointments: He saw PCP in August and sees again on 05/19/16. Patient sees DM specialist(Dr. Dwyane Dee) regularly and saw last on 02/17/16.  Advance Directives: Patient does not have advance directive and not interested in completing at this time.  Consent: Patient gave verbal consent for [facility] services. Patient states he is currently out of town but will be back home next week. He is reachable via his mobile number.  Plan: RN CM will notify [facility] administrative assistant of case status. RN CM will send [facility] community referral for further in home eval and assessment of care needs.   Enzo Montgomery, RN,BSN,CCM [place] Management Telephonic Care Management  Coordinator Direct Phone: [phone] Toll Free: [phone] Fax: [phone]

## 2016-03-25 NOTE — Patient Outreach (Signed)
[place]) Care Management  03/25/2016  [name] 06-12-1951 [id number]      Telephone Screen  Referral Date: 03/24/16 Referral Source: MD office(Dr. Dareen Piano) Referral Reason: " DM, HTN, medication adherence, chronic disease management"    Outreach attempt #1  to patient. Spoke with male caller at [phone] and advised that patient could be reached at [phone]. Contacted number provided. No answer at present. RN CM left HIPAA compliant voicemail message along with contact info.       Plan: RN CM will make outreach attempt to patient within a week if no response from patient.    Enzo Montgomery, RN,BSN,CCM [place] Management Telephonic Care Management Coordinator Direct Phone: [phone] Toll Free: [phone] Fax: [phone]'

## 2016-03-27 ENCOUNTER — Other Ambulatory Visit: Payer: Self-pay

## 2016-03-27 DIAGNOSIS — L97421 Non-pressure chronic ulcer of left heel and midfoot limited to breakdown of skin: Secondary | ICD-10-CM | POA: Diagnosis not present

## 2016-03-27 DIAGNOSIS — E1165 Type 2 diabetes mellitus with hyperglycemia: Secondary | ICD-10-CM | POA: Diagnosis not present

## 2016-03-27 DIAGNOSIS — Z7901 Long term (current) use of anticoagulants: Secondary | ICD-10-CM | POA: Diagnosis not present

## 2016-03-27 DIAGNOSIS — E11311 Type 2 diabetes mellitus with unspecified diabetic retinopathy with macular edema: Secondary | ICD-10-CM | POA: Diagnosis not present

## 2016-03-27 DIAGNOSIS — E1142 Type 2 diabetes mellitus with diabetic polyneuropathy: Secondary | ICD-10-CM | POA: Diagnosis not present

## 2016-03-27 DIAGNOSIS — I129 Hypertensive chronic kidney disease with stage 1 through stage 4 chronic kidney disease, or unspecified chronic kidney disease: Secondary | ICD-10-CM | POA: Diagnosis not present

## 2016-03-27 DIAGNOSIS — I480 Paroxysmal atrial fibrillation: Secondary | ICD-10-CM | POA: Diagnosis not present

## 2016-03-27 DIAGNOSIS — G894 Chronic pain syndrome: Secondary | ICD-10-CM | POA: Diagnosis not present

## 2016-03-27 DIAGNOSIS — N183 Chronic kidney disease, stage 3 (moderate): Secondary | ICD-10-CM | POA: Diagnosis not present

## 2016-03-27 DIAGNOSIS — E11621 Type 2 diabetes mellitus with foot ulcer: Secondary | ICD-10-CM | POA: Diagnosis not present

## 2016-03-27 DIAGNOSIS — E1122 Type 2 diabetes mellitus with diabetic chronic kidney disease: Secondary | ICD-10-CM | POA: Diagnosis not present

## 2016-03-27 NOTE — Patient Outreach (Signed)
    Unsuccessful attempt made to contact patient via telephone for community care coordination. HIPPA compliant message left wit this RNCM's contact information  Plan: Make another attempt to contact patient via telephone later this month.

## 2016-03-30 ENCOUNTER — Other Ambulatory Visit: Payer: Self-pay | Admitting: Internal Medicine

## 2016-03-30 ENCOUNTER — Ambulatory Visit: Payer: Medicare Other | Admitting: Endocrinology

## 2016-03-30 ENCOUNTER — Other Ambulatory Visit: Payer: Self-pay

## 2016-03-30 ENCOUNTER — Ambulatory Visit: Payer: Medicare Other

## 2016-03-30 DIAGNOSIS — I129 Hypertensive chronic kidney disease with stage 1 through stage 4 chronic kidney disease, or unspecified chronic kidney disease: Secondary | ICD-10-CM | POA: Diagnosis not present

## 2016-03-30 DIAGNOSIS — L97421 Non-pressure chronic ulcer of left heel and midfoot limited to breakdown of skin: Secondary | ICD-10-CM | POA: Diagnosis not present

## 2016-03-30 DIAGNOSIS — E11621 Type 2 diabetes mellitus with foot ulcer: Secondary | ICD-10-CM | POA: Diagnosis not present

## 2016-03-30 DIAGNOSIS — Z7901 Long term (current) use of anticoagulants: Secondary | ICD-10-CM | POA: Diagnosis not present

## 2016-03-30 DIAGNOSIS — I480 Paroxysmal atrial fibrillation: Secondary | ICD-10-CM | POA: Diagnosis not present

## 2016-03-30 DIAGNOSIS — G894 Chronic pain syndrome: Secondary | ICD-10-CM

## 2016-03-30 DIAGNOSIS — E1142 Type 2 diabetes mellitus with diabetic polyneuropathy: Secondary | ICD-10-CM | POA: Diagnosis not present

## 2016-03-30 DIAGNOSIS — E1122 Type 2 diabetes mellitus with diabetic chronic kidney disease: Secondary | ICD-10-CM | POA: Diagnosis not present

## 2016-03-30 DIAGNOSIS — N183 Chronic kidney disease, stage 3 (moderate): Secondary | ICD-10-CM | POA: Diagnosis not present

## 2016-03-30 DIAGNOSIS — E11311 Type 2 diabetes mellitus with unspecified diabetic retinopathy with macular edema: Secondary | ICD-10-CM | POA: Diagnosis not present

## 2016-03-30 DIAGNOSIS — E1165 Type 2 diabetes mellitus with hyperglycemia: Secondary | ICD-10-CM | POA: Diagnosis not present

## 2016-03-30 MED ORDER — HYDROCODONE-ACETAMINOPHEN 5-325 MG PO TABS: ORAL_TABLET | ORAL | 0 refills | Status: DC

## 2016-03-30 NOTE — Telephone Encounter (Signed)
Needs pain med refill °

## 2016-03-30 NOTE — Telephone Encounter (Signed)
Rx ready - pt called/informed. 

## 2016-03-30 NOTE — Telephone Encounter (Signed)
Last refill 03/02/16. Last OV 02/11/16; next appt 05/19/16. UDS  01/01/16. Last pain contract 03/28/14.

## 2016-03-30 NOTE — Patient Outreach (Signed)
[facility]) Care Management  03/30/2016   REAL CONA [birth year] [id number]  Subjective:  I have been doing okay. I do think I need some more education about what is wrong with me.  Objective:  Telephonic contact  Current Medications:  Current Outpatient Prescriptions  Medication Sig Dispense Refill  . ACCU-CHEK AVIVA PLUS test strip USE TO CHECK BLOOD SUGAR FOUR TIMES DAILY DAILY BEFORE MEALS AND AT BEDTIME 150 each 0  . ACCU-CHEK FASTCLIX LANCETS MISC Check blood sugar 4 times a day before meals and bedtime dx code 250.02 insulin requiring 102 each 6  . acetaminophen (TYLENOL) 500 MG tablet Take 1 tablet (500 mg total) by mouth every 4 (four) hours as needed. Maximum Daily Dosage of 2000 mg 100 tablet 0  . albuterol (PROAIR HFA) 108 (90 Base) MCG/ACT inhaler Inhale 2 puffs into the lungs every 6 (six) hours as needed for wheezing or shortness of breath. 18 g 3  . atorvastatin (LIPITOR) 20 MG tablet TAKE 1 TABLET(20 MG) BY MOUTH DAILY 90 tablet 3  . Blood Glucose Monitoring Suppl (ACCU-CHEK AVIVA PLUS) W/DEVICE KIT 1 each by Does not apply route 4 (four) times daily -  with meals and at bedtime. 1 kit 0  . cadexomer iodine (IODOSORB) 0.9 % gel Apply 1 application topically daily as needed for wound care. 40 g 0  . capsaicin (ZOSTRIX) 0.025 % cream Apply 1 application topically daily as needed (for pain).    . Cholecalciferol (VITAMIN D3) 2000 UNITS capsule Take 1 capsule (2,000 Units total) by mouth daily. 90 capsule 0  . ELIQUIS 5 MG TABS tablet TAKE 1 TABLET(5 MG) BY MOUTH TWICE DAILY 180 tablet 0  . enalapril (VASOTEC) 20 MG tablet TAKE 2 TABLETS(40 MG) BY MOUTH DAILY 90 tablet 3  . furosemide (LASIX) 40 MG tablet TAKE 1 TABLET(40 MG) BY MOUTH TWICE DAILY 60 tablet 0  . HYDROcodone-acetaminophen (NORCO/VICODIN) 5-325 MG tablet Take 1 and 1/2 tablets by mouth every 6 hours as needed for pain 140 tablet 0  . Insulin Glargine (TOUJEO SOLOSTAR) 300 UNIT/ML SOPN  Inject 60-90 Units into the skin 2 (two) times daily. 45 pen 2  . Insulin Pen Needle 31G X 5 MM MISC Use for insulin injection 5 times a day. 150 each 5  . insulin regular human CONCENTRATED (HUMULIN R) 500 UNIT/ML kwikpen Inject 20 units at breakfast, 60 units at lunch and 70 units at dinner. 18 mL 3  . LUMIGAN 0.01 % SOLN Place 1 drop into both eyes at bedtime.     . metoprolol succinate (TOPROL-XL) 25 MG 24 hr tablet Take 1 tablet (25 mg total) by mouth daily. 90 tablet 3  . omeprazole (PRILOSEC) 20 MG capsule TAKE 1 CAPSULE(20 MG) BY MOUTH DAILY 90 capsule 0  . pregabalin (LYRICA) 25 MG capsule Take 1 capsule (25 mg total) by mouth 3 (three) times daily. 90 capsule 2  . promethazine (PHENERGAN) 25 MG tablet TAKE 1/2 TO 1 TABLET BY MOUTH EVERY 6 HOURS AS NEEDED FOR NAUSEA 30 tablet 0  . silver sulfADIAZINE (SILVADENE) 1 % cream Apply 1 application topically daily. 50 g 0  . SIMBRINZA 1-0.2 % SUSP Place 1 drop into both eyes 2 (two) times daily. Take as instructed by Dr. Katy Fitch.    Marland Kitchen VICTOZA 18 MG/3ML SOPN Inject 1.8 mg once daily at the same time 9 mL 3  . VICTOZA 18 MG/3ML SOPN INJECT 1.8 MG ONCE DAILY AT THE SAME TIME 9 mL 0  .  BESIVANCE 0.6 % SUSP Place 1 drop into both eyes 4 (four) times daily.   12  . docusate sodium (COLACE) 100 MG capsule Take 1 capsule (100 mg total) by mouth 2 (two) times daily. (Patient not taking: Reported on 03/30/2016) 60 capsule 0   No current facility-administered medications for this visit.    THN CM Care Plan Problem One   Flowsheet Row Most Recent Value  Care Plan Problem One  patient has multiple chronic illnesses  Role Documenting the Problem One  Care Management Coordinator  Care Plan for Problem One  Active  THN Long Term Goal (31-90 days)  In the next 31 days, patient will have made and attended an  appoitnemnt with his primary care [place] Term Goal Start Date  03/30/16  Interventions for Problem One Long Term Goal  telephone contact to  assess patient's needs for community care coordination  THN CM Short Term Goal #1 (0-30 days)  In the next 21 days, patient will have met with [facility] Coordinator for an assessment of his eduational needs  THN CM Short Term Goal #1 Start Date  03/30/16  Interventions for Short Term Goal #1  initial telephone contact with patient , patient has sound knowledge base of his medications, however, he reports he needs additional chronic disease eduation.  Home visit scheduled for later this month     Functional Status:  In your present state of health, do you have any difficulty performing the following activities: 02/11/2016 01/01/2016  Hearing? N N  Vision? N N  Difficulty concentrating or making decisions? N N  Walking or climbing stairs? Y Y  Dressing or bathing? N N  Doing errands, shopping? N N  Some recent data might be hidden    Fall/Depression Screening: PHQ 2/9 Scores 03/30/2016 03/25/2016 02/11/2016 01/01/2016 01/01/2016 09/17/2015 06/21/2015  PHQ - 2 Score 1 0 0 0 0 0 2  PHQ- 9 Score - - - - - - -   Fall Risk  03/30/2016 03/25/2016 02/11/2016 01/01/2016 01/01/2016  Falls in the past year? Yes Yes No No No  Number falls in past yr: 1 2 or more - - -  Injury with Fall? No No - - -  Risk Factor Category  - - - - -  Risk for fall due to : History of fall(s);Impaired balance/gait;Impaired mobility;Impaired vision;Medication side effect - - - -  Risk for fall due to (comments): - - - - -  Follow up Follow up appointment Falls prevention discussed - - -    Assessment:  Patient has sound knowledge base related to his medications. Patient readily agreed to telephonic assessment and home visit later this month.   Plan:  Home visit later this month for chronic disease education and further assessment of needs for community care coordination

## 2016-03-31 ENCOUNTER — Ambulatory Visit (INDEPENDENT_AMBULATORY_CARE_PROVIDER_SITE_OTHER): Payer: Medicare Other | Admitting: Sports Medicine

## 2016-03-31 ENCOUNTER — Encounter: Payer: Self-pay | Admitting: Sports Medicine

## 2016-03-31 VITALS — Resp 18 | Ht 78.0 in | Wt 360.0 lb

## 2016-03-31 DIAGNOSIS — L97529 Non-pressure chronic ulcer of other part of left foot with unspecified severity: Principal | ICD-10-CM

## 2016-03-31 DIAGNOSIS — IMO0002 Reserved for concepts with insufficient information to code with codable children: Secondary | ICD-10-CM

## 2016-03-31 DIAGNOSIS — M79672 Pain in left foot: Secondary | ICD-10-CM

## 2016-03-31 DIAGNOSIS — E1140 Type 2 diabetes mellitus with diabetic neuropathy, unspecified: Secondary | ICD-10-CM

## 2016-03-31 DIAGNOSIS — Z89432 Acquired absence of left foot: Secondary | ICD-10-CM

## 2016-03-31 DIAGNOSIS — E11621 Type 2 diabetes mellitus with foot ulcer: Secondary | ICD-10-CM

## 2016-03-31 DIAGNOSIS — L89891 Pressure ulcer of other site, stage 1: Secondary | ICD-10-CM

## 2016-03-31 NOTE — Progress Notes (Signed)
Patient ID: [name], male   DOB: [birth year], 65 y.o.   MRN: [id number]  Subjective: [name] is a 65 y.o. male patient seen in office for follow up evaluation of ulceration of the left foot. Patient has a history of diabetes and a blood glucose level today not recorded. A1c 10.  Patient has nursing helping to change the dressing 2-3x/week applying betadine to site. Denies nausea/fever/vomiting/chills/night sweats/shortness of breath/pain. Patient has no other pedal complaints at this time.  Patient Active Problem List   Diagnosis Date Noted  . Chronic pain syndrome 09/18/2015  . Chronic fatigue 04/16/2015  . Preventative health care 03/12/2015  . Family history of coronary artery disease in father 12/14/2014  . Sleep apnea-on C-pap 12/14/2014  . Acute osteomyelitis of toe of left foot ([place])   . Diabetic foot infection ([place]) 12/13/2014  . Foot ulcer, left ([place]) 12/13/2014  . Paroxysmal atrial fibrillation, new onset 12/13/2014  . Elevated alkaline phosphatase level 06/11/2014  . Constipation 05/31/2014  . Vitamin D deficiency 11/17/2013  . Frequent falls 06/30/2013  . Mild memory disturbance 07/27/2012  . Anemia 02/10/2012  . Hypertension 11/18/2011  . Obstructive sleep apnea 12/12/2009  . EDEMA 02/13/2009  . GERD 06/27/2008  . Diabetes type 2, uncontrolled ([place]) 12/28/2007  . DIABETIC MACULAR EDEMA 10/07/2007  . Dyslipidemia 08/15/2007  . BACKGROUND DIABETIC RETINOPATHY 08/15/2007  . ERECTILE DYSFUNCTION 05/31/2007  . HEARING LOSS, SENSORINEURAL, BILATERAL 01/03/2007  . Diabetic peripheral neuropathy associated with type 2 diabetes mellitus ([place]) 12/16/2006  . Chronic kidney disease, stage III (moderate) 12/01/2006  . Obesity BMI 46 08/23/2006  . STATUS, OTHER TOE(S) AMPUTATION 05/21/2006   Current Outpatient Prescriptions on File Prior to Visit  Medication Sig Dispense Refill  . ACCU-CHEK AVIVA PLUS test strip USE TO CHECK BLOOD SUGAR FOUR TIMES DAILY  DAILY BEFORE MEALS AND AT BEDTIME 150 each 0  . ACCU-CHEK FASTCLIX LANCETS MISC Check blood sugar 4 times a day before meals and bedtime dx code 250.02 insulin requiring 102 each 6  . acetaminophen (TYLENOL) 500 MG tablet Take 1 tablet (500 mg total) by mouth every 4 (four) hours as needed. Maximum Daily Dosage of 2000 mg 100 tablet 0  . albuterol (PROAIR HFA) 108 (90 Base) MCG/ACT inhaler Inhale 2 puffs into the lungs every 6 (six) hours as needed for wheezing or shortness of breath. 18 g 3  . atorvastatin (LIPITOR) 20 MG tablet TAKE 1 TABLET(20 MG) BY MOUTH DAILY 90 tablet 3  . BESIVANCE 0.6 % SUSP Place 1 drop into both eyes 4 (four) times daily.   12  . Blood Glucose Monitoring Suppl (ACCU-CHEK AVIVA PLUS) W/DEVICE KIT 1 each by Does not apply route 4 (four) times daily -  with meals and at bedtime. 1 kit 0  . cadexomer iodine (IODOSORB) 0.9 % gel Apply 1 application topically daily as needed for wound care. 40 g 0  . capsaicin (ZOSTRIX) 0.025 % cream Apply 1 application topically daily as needed (for pain).    . Cholecalciferol (VITAMIN D3) 2000 UNITS capsule Take 1 capsule (2,000 Units total) by mouth daily. 90 capsule 0  . docusate sodium (COLACE) 100 MG capsule Take 1 capsule (100 mg total) by mouth 2 (two) times daily. (Patient not taking: Reported on 03/30/2016) 60 capsule 0  . ELIQUIS 5 MG TABS tablet TAKE 1 TABLET(5 MG) BY MOUTH TWICE DAILY 180 tablet 0  . enalapril (VASOTEC) 20 MG tablet TAKE 2 TABLETS(40 MG) BY MOUTH DAILY 90 tablet 3  .  furosemide (LASIX) 40 MG tablet TAKE 1 TABLET(40 MG) BY MOUTH TWICE DAILY 60 tablet 0  . HYDROcodone-acetaminophen (NORCO/VICODIN) 5-325 MG tablet Take 1 and 1/2 tablets by mouth every 6 hours as needed for pain 140 tablet 0  . Insulin Glargine (TOUJEO SOLOSTAR) 300 UNIT/ML SOPN Inject 60-90 Units into the skin 2 (two) times daily. 45 pen 2  . Insulin Pen Needle 31G X 5 MM MISC Use for insulin injection 5 times a day. 150 each 5  . insulin regular human  CONCENTRATED (HUMULIN R) 500 UNIT/ML kwikpen Inject 20 units at breakfast, 60 units at lunch and 70 units at dinner. 18 mL 3  . LUMIGAN 0.01 % SOLN Place 1 drop into both eyes at bedtime.     . metoprolol succinate (TOPROL-XL) 25 MG 24 hr tablet Take 1 tablet (25 mg total) by mouth daily. 90 tablet 3  . omeprazole (PRILOSEC) 20 MG capsule TAKE 1 CAPSULE(20 MG) BY MOUTH DAILY 90 capsule 0  . pregabalin (LYRICA) 25 MG capsule Take 1 capsule (25 mg total) by mouth 3 (three) times daily. 90 capsule 2  . promethazine (PHENERGAN) 25 MG tablet TAKE 1/2 TO 1 TABLET BY MOUTH EVERY 6 HOURS AS NEEDED FOR NAUSEA 30 tablet 0  . silver sulfADIAZINE (SILVADENE) 1 % cream Apply 1 application topically daily. 50 g 0  . SIMBRINZA 1-0.2 % SUSP Place 1 drop into both eyes 2 (two) times daily. Take as instructed by Dr. Katy Fitch.    Marland Kitchen VICTOZA 18 MG/3ML SOPN Inject 1.8 mg once daily at the same time 9 mL 3  . VICTOZA 18 MG/3ML SOPN INJECT 1.8 MG ONCE DAILY AT THE SAME TIME 9 mL 0   No current facility-administered medications on file prior to visit.    Allergies  Allergen Reactions  . Vancomycin     REACTION: ARF    Recent Results (from the past 2160 hour(s))  Comprehensive metabolic panel     Status: Abnormal   Collection Time: 02/11/16 10:11 AM  Result Value Ref Range   Sodium 136 135 - 145 mEq/L   Potassium 5.0 3.5 - 5.1 mEq/L   Chloride 105 96 - 112 mEq/L   CO2 24 19 - 32 mEq/L   Glucose, Bld 112 (H) 70 - 99 mg/dL   BUN 27 (H) 6 - 23 mg/dL   Creatinine, Ser 1.78 (H) 0.40 - 1.50 mg/dL   Total Bilirubin 0.4 0.2 - 1.2 mg/dL   Alkaline Phosphatase 143 (H) 39 - 117 U/L   AST 15 0 - 37 U/L   ALT 17 0 - 53 U/L   Total Protein 7.9 6.0 - 8.3 g/dL   Albumin 3.9 3.5 - 5.2 g/dL   Calcium 9.9 8.4 - 10.5 mg/dL   GFR 49.43 (L) >60.00 mL/min  Fructosamine     Status: Abnormal   Collection Time: 02/11/16 10:11 AM  Result Value Ref Range   Fructosamine 306 (H) 0 - 285 umol/L    Comment: Published reference  interval for apparently healthy subjects between age 2 and 45 is 57 - 285 umol/L and in a poorly controlled diabetic population is 228 - 563 umol/L with a mean of 396 umol/L.     Objective: Vitals:   03/31/16 1440  Weight: (!) 360 lb (163.3 kg)  Height: 6' 6" (1.981 m)    General: Patient is awake, alert, oriented x 3 and in no acute distress.  Dermatology: Skin is warm and dry bilateral with a now full thickness ulceration present  Left plantar forefoot. Ulceration measures 1.2cm x 3cm x 0.5cm  (last measurement smaller) extending into skin crease at amp site. There is a moderate keratotic border with a granular base. The ulceration does not probe to bone. There is mild malodor, no active drainage, no erythema, decreasd swelling without warmth to dorsum of foot. No other acute signs of infection.   Vascular: Dorsalis Pedis pulse = 0/4 Bilateral,  Posterior Tibial pulse = 1/4 Bilateral,  Capillary Fill Time < 5 seconds at remaining toes   Neurologic: Protective sensation absent bilateral using SWMF.  Musculosketal: No Pain with palpation to ulcerated area on left. No pain with compression to calves bilateral. 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  Assessment and Plan:  Problem List Items Addressed This Visit    None    Visit Diagnoses    Diabetic ulcer of left foot associated with type 2 diabetes mellitus ([place])    -  Primary   Foot amputation status, left ([place])       Type 2 diabetes mellitus with diabetic neuropathy, unspecified long term insulin use status ([facility])       Foot pain, left         -Examined patient and discussed the progression of the wound and treatment alternatives. - Excisionally dedbrided ulceration to healthy bleeding borders using a sterile chisel blade.  -Applied Prisma Ag and dry sterile dressing and instructed patient continue with home nursing 3x per week consisting of PRISMA and dry sterile dressing. -Applied offloading pad to post op shoe to use  daily to left foot -Advised patient to refrain from getting the ulceration wet and to limit activity to necessity -Advised patient to go to the ER or return to office if the wound worsens or if constitutional symptoms are present. -Patient to return to office 3-4 weeks for follow up ulceration care and evaluation or sooner if problems arise.  Landis Martins, DPM

## 2016-04-01 DIAGNOSIS — Z7901 Long term (current) use of anticoagulants: Secondary | ICD-10-CM | POA: Diagnosis not present

## 2016-04-01 DIAGNOSIS — N183 Chronic kidney disease, stage 3 (moderate): Secondary | ICD-10-CM | POA: Diagnosis not present

## 2016-04-01 DIAGNOSIS — E11311 Type 2 diabetes mellitus with unspecified diabetic retinopathy with macular edema: Secondary | ICD-10-CM | POA: Diagnosis not present

## 2016-04-01 DIAGNOSIS — E1165 Type 2 diabetes mellitus with hyperglycemia: Secondary | ICD-10-CM | POA: Diagnosis not present

## 2016-04-01 DIAGNOSIS — L97421 Non-pressure chronic ulcer of left heel and midfoot limited to breakdown of skin: Secondary | ICD-10-CM | POA: Diagnosis not present

## 2016-04-01 DIAGNOSIS — E1122 Type 2 diabetes mellitus with diabetic chronic kidney disease: Secondary | ICD-10-CM | POA: Diagnosis not present

## 2016-04-01 DIAGNOSIS — G894 Chronic pain syndrome: Secondary | ICD-10-CM | POA: Diagnosis not present

## 2016-04-01 DIAGNOSIS — I129 Hypertensive chronic kidney disease with stage 1 through stage 4 chronic kidney disease, or unspecified chronic kidney disease: Secondary | ICD-10-CM | POA: Diagnosis not present

## 2016-04-01 DIAGNOSIS — E1142 Type 2 diabetes mellitus with diabetic polyneuropathy: Secondary | ICD-10-CM | POA: Diagnosis not present

## 2016-04-01 DIAGNOSIS — E11621 Type 2 diabetes mellitus with foot ulcer: Secondary | ICD-10-CM | POA: Diagnosis not present

## 2016-04-01 DIAGNOSIS — I480 Paroxysmal atrial fibrillation: Secondary | ICD-10-CM | POA: Diagnosis not present

## 2016-04-03 ENCOUNTER — Other Ambulatory Visit: Payer: Self-pay

## 2016-04-03 DIAGNOSIS — L97421 Non-pressure chronic ulcer of left heel and midfoot limited to breakdown of skin: Secondary | ICD-10-CM | POA: Diagnosis not present

## 2016-04-03 DIAGNOSIS — E11621 Type 2 diabetes mellitus with foot ulcer: Secondary | ICD-10-CM | POA: Diagnosis not present

## 2016-04-03 DIAGNOSIS — E1122 Type 2 diabetes mellitus with diabetic chronic kidney disease: Secondary | ICD-10-CM | POA: Diagnosis not present

## 2016-04-03 DIAGNOSIS — E1165 Type 2 diabetes mellitus with hyperglycemia: Secondary | ICD-10-CM | POA: Diagnosis not present

## 2016-04-03 DIAGNOSIS — I129 Hypertensive chronic kidney disease with stage 1 through stage 4 chronic kidney disease, or unspecified chronic kidney disease: Secondary | ICD-10-CM | POA: Diagnosis not present

## 2016-04-03 DIAGNOSIS — Z7901 Long term (current) use of anticoagulants: Secondary | ICD-10-CM | POA: Diagnosis not present

## 2016-04-03 DIAGNOSIS — E1142 Type 2 diabetes mellitus with diabetic polyneuropathy: Secondary | ICD-10-CM | POA: Diagnosis not present

## 2016-04-03 DIAGNOSIS — E11311 Type 2 diabetes mellitus with unspecified diabetic retinopathy with macular edema: Secondary | ICD-10-CM | POA: Diagnosis not present

## 2016-04-03 DIAGNOSIS — G894 Chronic pain syndrome: Secondary | ICD-10-CM | POA: Diagnosis not present

## 2016-04-03 DIAGNOSIS — N183 Chronic kidney disease, stage 3 (moderate): Secondary | ICD-10-CM | POA: Diagnosis not present

## 2016-04-03 DIAGNOSIS — I480 Paroxysmal atrial fibrillation: Secondary | ICD-10-CM | POA: Diagnosis not present

## 2016-04-03 NOTE — Patient Outreach (Signed)
[facility]) Care Management  04/03/2016  [name] 05-25-1951 [id number]     Incoming call from patient advising that he was going out of town next week. Patient requesting to reschedule [facility] community RN home visit. Advised patient that RN CM would notify assigned RN(Pam Ileene Patrick) and have RN contact him back to reschedue appt. He voiced understanding.     Plan: RN CM left voicemail message for assigned RN regarding the above matter and requested that RN follow up with patient.    Enzo Montgomery, RN,BSN,CCM [facility] Management Telephonic Care Management Coordinator Direct Phone: [phone] Toll Free: [phone] Fax: [phone]

## 2016-04-06 ENCOUNTER — Telehealth: Payer: Self-pay | Admitting: *Deleted

## 2016-04-06 DIAGNOSIS — G894 Chronic pain syndrome: Secondary | ICD-10-CM | POA: Diagnosis not present

## 2016-04-06 DIAGNOSIS — E11311 Type 2 diabetes mellitus with unspecified diabetic retinopathy with macular edema: Secondary | ICD-10-CM | POA: Diagnosis not present

## 2016-04-06 DIAGNOSIS — E1122 Type 2 diabetes mellitus with diabetic chronic kidney disease: Secondary | ICD-10-CM | POA: Diagnosis not present

## 2016-04-06 DIAGNOSIS — Z7901 Long term (current) use of anticoagulants: Secondary | ICD-10-CM | POA: Diagnosis not present

## 2016-04-06 DIAGNOSIS — I129 Hypertensive chronic kidney disease with stage 1 through stage 4 chronic kidney disease, or unspecified chronic kidney disease: Secondary | ICD-10-CM | POA: Diagnosis not present

## 2016-04-06 DIAGNOSIS — L97421 Non-pressure chronic ulcer of left heel and midfoot limited to breakdown of skin: Secondary | ICD-10-CM | POA: Diagnosis not present

## 2016-04-06 DIAGNOSIS — E11621 Type 2 diabetes mellitus with foot ulcer: Secondary | ICD-10-CM | POA: Diagnosis not present

## 2016-04-06 DIAGNOSIS — I480 Paroxysmal atrial fibrillation: Secondary | ICD-10-CM | POA: Diagnosis not present

## 2016-04-06 DIAGNOSIS — E1142 Type 2 diabetes mellitus with diabetic polyneuropathy: Secondary | ICD-10-CM | POA: Diagnosis not present

## 2016-04-06 DIAGNOSIS — E1165 Type 2 diabetes mellitus with hyperglycemia: Secondary | ICD-10-CM | POA: Diagnosis not present

## 2016-04-06 DIAGNOSIS — N183 Chronic kidney disease, stage 3 (moderate): Secondary | ICD-10-CM | POA: Diagnosis not present

## 2016-04-06 NOTE — Telephone Encounter (Addendum)
Pt states Dr. Cannon Kettle had wanted him to use Prisma and the nurses might need something from our office.  I reviewed orders of 03/10/2016 and Prisma had been ordered as dressing changes.  I told pt I was not clear on his request, to have the home health nurses call me if there was a need for other orders.  Pt took down my name and phone. 04/07/2016-Crystal - [facility] states pt still has samples of Prisma, but will run out soon and it is too expensive for his budget. Faxed order for Prisma to [facility], with pt demographics. 05/11/2016-Angela - [facility] states she has re-certified pt for 3x week for 9 weeks and is continuing the current collagen with dry dressing, and asked if there were to be any changes. 05/12/2016-Left message for Levada Dy - [facility] that Dr. Cannon Kettle had continued the orders.

## 2016-04-07 ENCOUNTER — Other Ambulatory Visit: Payer: Self-pay

## 2016-04-07 NOTE — Telephone Encounter (Signed)
See if we can order dressing from wound supply company like [facility] for the patient. Thanks Dr. Cannon Kettle

## 2016-04-07 NOTE — Patient Outreach (Signed)
    Telephone call received from patient to inform this NRCM he would not be available for home visit on Thursday, September 28.  Patient and this RNCM agreed on home visit in October.    Plan: Home visit next month

## 2016-04-08 DIAGNOSIS — E11311 Type 2 diabetes mellitus with unspecified diabetic retinopathy with macular edema: Secondary | ICD-10-CM | POA: Diagnosis not present

## 2016-04-08 DIAGNOSIS — N183 Chronic kidney disease, stage 3 (moderate): Secondary | ICD-10-CM | POA: Diagnosis not present

## 2016-04-08 DIAGNOSIS — I480 Paroxysmal atrial fibrillation: Secondary | ICD-10-CM | POA: Diagnosis not present

## 2016-04-08 DIAGNOSIS — I129 Hypertensive chronic kidney disease with stage 1 through stage 4 chronic kidney disease, or unspecified chronic kidney disease: Secondary | ICD-10-CM | POA: Diagnosis not present

## 2016-04-08 DIAGNOSIS — G894 Chronic pain syndrome: Secondary | ICD-10-CM | POA: Diagnosis not present

## 2016-04-08 DIAGNOSIS — L97529 Non-pressure chronic ulcer of other part of left foot with unspecified severity: Secondary | ICD-10-CM | POA: Diagnosis not present

## 2016-04-08 DIAGNOSIS — Z7901 Long term (current) use of anticoagulants: Secondary | ICD-10-CM | POA: Diagnosis not present

## 2016-04-08 DIAGNOSIS — E1142 Type 2 diabetes mellitus with diabetic polyneuropathy: Secondary | ICD-10-CM | POA: Diagnosis not present

## 2016-04-08 DIAGNOSIS — L97421 Non-pressure chronic ulcer of left heel and midfoot limited to breakdown of skin: Secondary | ICD-10-CM | POA: Diagnosis not present

## 2016-04-08 DIAGNOSIS — E1165 Type 2 diabetes mellitus with hyperglycemia: Secondary | ICD-10-CM | POA: Diagnosis not present

## 2016-04-08 DIAGNOSIS — E11621 Type 2 diabetes mellitus with foot ulcer: Secondary | ICD-10-CM | POA: Diagnosis not present

## 2016-04-08 DIAGNOSIS — E1122 Type 2 diabetes mellitus with diabetic chronic kidney disease: Secondary | ICD-10-CM | POA: Diagnosis not present

## 2016-04-09 ENCOUNTER — Ambulatory Visit: Payer: Self-pay

## 2016-04-09 ENCOUNTER — Other Ambulatory Visit: Payer: Self-pay | Admitting: Internal Medicine

## 2016-04-10 DIAGNOSIS — I129 Hypertensive chronic kidney disease with stage 1 through stage 4 chronic kidney disease, or unspecified chronic kidney disease: Secondary | ICD-10-CM | POA: Diagnosis not present

## 2016-04-10 DIAGNOSIS — E1142 Type 2 diabetes mellitus with diabetic polyneuropathy: Secondary | ICD-10-CM | POA: Diagnosis not present

## 2016-04-10 DIAGNOSIS — G894 Chronic pain syndrome: Secondary | ICD-10-CM | POA: Diagnosis not present

## 2016-04-10 DIAGNOSIS — I480 Paroxysmal atrial fibrillation: Secondary | ICD-10-CM | POA: Diagnosis not present

## 2016-04-10 DIAGNOSIS — E1165 Type 2 diabetes mellitus with hyperglycemia: Secondary | ICD-10-CM | POA: Diagnosis not present

## 2016-04-10 DIAGNOSIS — L97421 Non-pressure chronic ulcer of left heel and midfoot limited to breakdown of skin: Secondary | ICD-10-CM | POA: Diagnosis not present

## 2016-04-10 DIAGNOSIS — E11621 Type 2 diabetes mellitus with foot ulcer: Secondary | ICD-10-CM | POA: Diagnosis not present

## 2016-04-10 DIAGNOSIS — E11311 Type 2 diabetes mellitus with unspecified diabetic retinopathy with macular edema: Secondary | ICD-10-CM | POA: Diagnosis not present

## 2016-04-10 DIAGNOSIS — E1122 Type 2 diabetes mellitus with diabetic chronic kidney disease: Secondary | ICD-10-CM | POA: Diagnosis not present

## 2016-04-10 DIAGNOSIS — Z7901 Long term (current) use of anticoagulants: Secondary | ICD-10-CM | POA: Diagnosis not present

## 2016-04-10 DIAGNOSIS — N183 Chronic kidney disease, stage 3 (moderate): Secondary | ICD-10-CM | POA: Diagnosis not present

## 2016-04-13 ENCOUNTER — Other Ambulatory Visit (INDEPENDENT_AMBULATORY_CARE_PROVIDER_SITE_OTHER): Payer: Medicare Other

## 2016-04-13 ENCOUNTER — Other Ambulatory Visit: Payer: Self-pay | Admitting: Internal Medicine

## 2016-04-13 ENCOUNTER — Other Ambulatory Visit: Payer: Self-pay

## 2016-04-13 DIAGNOSIS — E11621 Type 2 diabetes mellitus with foot ulcer: Secondary | ICD-10-CM | POA: Diagnosis not present

## 2016-04-13 DIAGNOSIS — E1142 Type 2 diabetes mellitus with diabetic polyneuropathy: Secondary | ICD-10-CM | POA: Diagnosis not present

## 2016-04-13 DIAGNOSIS — E1122 Type 2 diabetes mellitus with diabetic chronic kidney disease: Secondary | ICD-10-CM | POA: Diagnosis not present

## 2016-04-13 DIAGNOSIS — L97421 Non-pressure chronic ulcer of left heel and midfoot limited to breakdown of skin: Secondary | ICD-10-CM | POA: Diagnosis not present

## 2016-04-13 DIAGNOSIS — E11311 Type 2 diabetes mellitus with unspecified diabetic retinopathy with macular edema: Secondary | ICD-10-CM | POA: Diagnosis not present

## 2016-04-13 DIAGNOSIS — N183 Chronic kidney disease, stage 3 (moderate): Secondary | ICD-10-CM | POA: Diagnosis not present

## 2016-04-13 DIAGNOSIS — I129 Hypertensive chronic kidney disease with stage 1 through stage 4 chronic kidney disease, or unspecified chronic kidney disease: Secondary | ICD-10-CM | POA: Diagnosis not present

## 2016-04-13 DIAGNOSIS — I480 Paroxysmal atrial fibrillation: Secondary | ICD-10-CM | POA: Diagnosis not present

## 2016-04-13 DIAGNOSIS — Z7901 Long term (current) use of anticoagulants: Secondary | ICD-10-CM | POA: Diagnosis not present

## 2016-04-13 DIAGNOSIS — E1165 Type 2 diabetes mellitus with hyperglycemia: Secondary | ICD-10-CM | POA: Diagnosis not present

## 2016-04-13 DIAGNOSIS — Z794 Long term (current) use of insulin: Secondary | ICD-10-CM

## 2016-04-13 DIAGNOSIS — G894 Chronic pain syndrome: Secondary | ICD-10-CM | POA: Diagnosis not present

## 2016-04-13 LAB — COMPREHENSIVE METABOLIC PANEL
ALT: 13 U/L (ref 0–53)
AST: 13 U/L (ref 0–37)
Albumin: 3.5 g/dL (ref 3.5–5.2)
Alkaline Phosphatase: 126 U/L — ABNORMAL HIGH (ref 39–117)
BILIRUBIN TOTAL: 0.4 mg/dL (ref 0.2–1.2)
BUN: 19 mg/dL (ref 6–23)
CALCIUM: 8.8 mg/dL (ref 8.4–10.5)
CHLORIDE: 105 meq/L (ref 96–112)
CO2: 27 meq/L (ref 19–32)
CREATININE: 1.35 mg/dL (ref 0.40–1.50)
GFR: 67.98 mL/min (ref >60.00)
GLUCOSE: 132 mg/dL — AB (ref 70–99)
Potassium: 3.9 mEq/L (ref 3.5–5.1)
SODIUM: 137 meq/L (ref 135–145)
Total Protein: 7.3 g/dL (ref 6.0–8.3)

## 2016-04-13 LAB — MICROALBUMIN / CREATININE URINE RATIO
CREATININE, U: 89.7 mg/dL
Microalb Creat Ratio: 16.6 mg/g (ref 0.0–30.0)
Microalb, Ur: 14.9 mg/dL — ABNORMAL HIGH (ref 0.0–1.9)

## 2016-04-13 LAB — HEMOGLOBIN A1C: HEMOGLOBIN A1C: 9.1 % — AB (ref 4.6–6.5)

## 2016-04-13 NOTE — Patient Outreach (Signed)
   Telephone call made to patient to see if he has returned to town and to remind him of tomorrow's scheduled home visit. Patient states he will be home for the home visit.  Plan: Home visit later this week

## 2016-04-14 ENCOUNTER — Other Ambulatory Visit: Payer: Self-pay

## 2016-04-15 DIAGNOSIS — E1142 Type 2 diabetes mellitus with diabetic polyneuropathy: Secondary | ICD-10-CM | POA: Diagnosis not present

## 2016-04-15 DIAGNOSIS — L97421 Non-pressure chronic ulcer of left heel and midfoot limited to breakdown of skin: Secondary | ICD-10-CM | POA: Diagnosis not present

## 2016-04-15 DIAGNOSIS — I480 Paroxysmal atrial fibrillation: Secondary | ICD-10-CM | POA: Diagnosis not present

## 2016-04-15 DIAGNOSIS — E1122 Type 2 diabetes mellitus with diabetic chronic kidney disease: Secondary | ICD-10-CM | POA: Diagnosis not present

## 2016-04-15 DIAGNOSIS — E11311 Type 2 diabetes mellitus with unspecified diabetic retinopathy with macular edema: Secondary | ICD-10-CM | POA: Diagnosis not present

## 2016-04-15 DIAGNOSIS — I129 Hypertensive chronic kidney disease with stage 1 through stage 4 chronic kidney disease, or unspecified chronic kidney disease: Secondary | ICD-10-CM | POA: Diagnosis not present

## 2016-04-15 DIAGNOSIS — E1165 Type 2 diabetes mellitus with hyperglycemia: Secondary | ICD-10-CM | POA: Diagnosis not present

## 2016-04-15 DIAGNOSIS — E11621 Type 2 diabetes mellitus with foot ulcer: Secondary | ICD-10-CM | POA: Diagnosis not present

## 2016-04-15 DIAGNOSIS — Z7901 Long term (current) use of anticoagulants: Secondary | ICD-10-CM | POA: Diagnosis not present

## 2016-04-15 DIAGNOSIS — G894 Chronic pain syndrome: Secondary | ICD-10-CM | POA: Diagnosis not present

## 2016-04-15 DIAGNOSIS — N183 Chronic kidney disease, stage 3 (moderate): Secondary | ICD-10-CM | POA: Diagnosis not present

## 2016-04-15 NOTE — Patient Outreach (Signed)
[name]) Care Management   04/15/2016  [name] 08-24-1950 [id number]  [name] is an 65 y.o. male  Subjective:  I check my blood sugars 3-4 times per day. I have lost a toe on my left foot  Objective:   ROS  Very tall, slender gentleman. Very pleasant and experienced willingness to learn about diabetes.  Physical Exam  ROS  Encounter Medications:   Outpatient Encounter Prescriptions as of 04/14/2016  Medication Sig Note  . ACCU-CHEK AVIVA PLUS test strip USE TO CHECK BLOOD SUGAR FOUR TIMES DAILY DAILY BEFORE MEALS AND AT BEDTIME   . ACCU-CHEK FASTCLIX LANCETS MISC Check blood sugar 4 times a day before meals and bedtime dx code 250.02 insulin requiring   . acetaminophen (TYLENOL) 500 MG tablet Take 1 tablet (500 mg total) by mouth every 4 (four) hours as needed. Maximum Daily Dosage of 2000 mg   . albuterol (PROAIR HFA) 108 (90 Base) MCG/ACT inhaler Inhale 2 puffs into the lungs every 6 (six) hours as needed for wheezing or shortness of breath.   Marland Kitchen atorvastatin (LIPITOR) 20 MG tablet TAKE 1 TABLET(20 MG) BY MOUTH DAILY   . BESIVANCE 0.6 % SUSP Place 1 drop into both eyes 4 (four) times daily.    . Blood Glucose Monitoring Suppl (ACCU-CHEK AVIVA PLUS) W/DEVICE KIT 1 each by Does not apply route 4 (four) times daily -  with meals and at bedtime.   . cadexomer iodine (IODOSORB) 0.9 % gel Apply 1 application topically daily as needed for wound care.   . capsaicin (ZOSTRIX) 0.025 % cream Apply 1 application topically daily as needed (for pain).   . Cholecalciferol (VITAMIN D3) 2000 UNITS capsule Take 1 capsule (2,000 Units total) by mouth daily.   Marland Kitchen docusate sodium (COLACE) 100 MG capsule Take 1 capsule (100 mg total) by mouth 2 (two) times daily. (Patient not taking: Reported on 03/30/2016)   . ELIQUIS 5 MG TABS tablet TAKE 1 TABLET(5 MG) BY MOUTH TWICE DAILY   . enalapril (VASOTEC) 20 MG tablet TAKE 2 TABLETS(40 MG) BY MOUTH DAILY   .  furosemide (LASIX) 40 MG tablet TAKE 1 TABLET(40 MG) BY MOUTH TWICE DAILY   . HYDROcodone-acetaminophen (NORCO/VICODIN) 5-325 MG tablet Take 1 and 1/2 tablets by mouth every 6 hours as needed for pain   . Insulin Glargine (TOUJEO SOLOSTAR) 300 UNIT/ML SOPN Inject 60-90 Units into the skin 2 (two) times daily.   . Insulin Pen Needle 31G X 5 MM MISC Use for insulin injection 5 times a day.   . insulin regular human CONCENTRATED (HUMULIN R) 500 UNIT/ML kwikpen Inject 20 units at breakfast, 60 units at lunch and 70 units at dinner.   Marland Kitchen LUMIGAN 0.01 % SOLN Place 1 drop into both eyes at bedtime.  03/28/2014: Prescribed by Dr. Katy Fitch.  . metoprolol succinate (TOPROL-XL) 25 MG 24 hr tablet Take 1 tablet (25 mg total) by mouth daily.   Marland Kitchen omeprazole (PRILOSEC) 20 MG capsule TAKE 1 CAPSULE(20 MG) BY MOUTH DAILY   . pregabalin (LYRICA) 25 MG capsule Take 1 capsule (25 mg total) by mouth 3 (three) times daily.   . promethazine (PHENERGAN) 25 MG tablet TAKE 1/2 TO 1 TABLET BY MOUTH EVERY 6 HOURS AS NEEDED FOR NAUSEA   . silver sulfADIAZINE (SILVADENE) 1 % cream Apply 1 application topically daily.   Marland Kitchen SIMBRINZA 1-0.2 % SUSP Place 1 drop into both eyes 2 (two) times daily. Take as instructed by Dr. Katy Fitch. 11/02/2012: Prescribed  by Dr. Katy Fitch.  Marland Kitchen VICTOZA 18 MG/3ML SOPN Inject 1.8 mg once daily at the same time   . VICTOZA 18 MG/3ML SOPN INJECT 1.8 MG ONCE DAILY AT THE SAME TIME    No facility-administered encounter medications on file as of 04/14/2016.     Functional Status:   In your present state of health, do you have any difficulty performing the following activities: 04/15/2016 02/11/2016  Hearing? N N  Vision? Y N  Difficulty concentrating or making decisions? N N  Walking or climbing stairs? Y Y  Dressing or bathing? N N  Doing errands, shopping? Y N  Preparing Food and eating ? Y -  Using the Toilet? N -  In the past six months, have you accidently leaked urine? N -  Do you have problems with loss of  bowel control? N -  Managing your Medications? N -  Managing your Finances? N -  Housekeeping or managing your Housekeeping? Y -  Some recent data might be hidden   [facility] CM Care Plan Problem One   Flowsheet Row Most Recent Value  Care Plan Problem One  patient has multiple chronic illnesses  Role Documenting the Problem One  Care Management Coordinator  Care Plan for Problem One  Active  THN Long Term Goal (31-90 days)  In the next 31 days, patient will have made and attended an  appoitnemnt with his primary care [place] Term Goal Start Date  03/30/16  Interventions for Problem One Long Term Goal  home visit for diabetic education  THN CM Short Term Goal #1 (0-30 days)  In the next 21 days, patient will have met with [facility] Coordinator for an assessment of his eduational needs  [facility] [place] CM Short Term Goal #1 Start Date  03/30/16  Interventions for Short Term Goal #1  Home visit for diabetic education.  Patient has a hgb A1C level of 8.0    THN CM Care Plan Problem Two   Flowsheet Row Most Recent Value  Care Plan Problem Two  Patinet has a hgA1C of over 8.0  Role Documenting the Problem Two  Care Management [place] for Problem Two  Active  THN CM Short Term Goal #1 (0-30 days)  In the next 28 days, patient will meet with RNCM for education on low carbohydrate diets  THN CM Short Term Goal #1 Start Date  04/14/16  Interventions for Short Term Goal #2   Home visit to review patient's knowledge of low carbohydrate diets,       Fall/Depression Screening:    PHQ 2/9 Scores 04/15/2016 03/30/2016 03/25/2016 02/11/2016 01/01/2016 01/01/2016 09/17/2015  PHQ - 2 Score 0 1 0 0 0 0 0  PHQ- 9 Score - - - - - - -   Fall Risk  04/15/2016 03/30/2016 03/25/2016 02/11/2016 01/01/2016  Falls in the past year? Yes Yes Yes No No  Number falls in past yr: _0 or more - -  Injury with Fall? No No No - -  Risk Factor Category  - - - - -  Risk for fall due to : - History of  fall(s);Impaired balance/gait;Impaired mobility;Impaired vision;Medication side effect - - -  Risk for fall due to (comments): - - - - -  Follow up Education provided;Falls prevention discussed Follow up appointment Falls prevention discussed - -   Assessment:   Patient has hgA1C of over 8.0 Patient has all medications, demonstrated knowledge of medication regimen. Patient viewed  [facility] Videos on Diabetes  Plan:  Meet with patient later this month for diabetes education.

## 2016-04-16 ENCOUNTER — Ambulatory Visit (INDEPENDENT_AMBULATORY_CARE_PROVIDER_SITE_OTHER): Payer: Medicare Other | Admitting: Endocrinology

## 2016-04-16 ENCOUNTER — Encounter: Payer: Self-pay | Admitting: Endocrinology

## 2016-04-16 VITALS — BP 101/53 | HR 79 | Temp 97.9°F | Resp 16 | Ht 78.0 in | Wt 368.8 lb

## 2016-04-16 DIAGNOSIS — Z794 Long term (current) use of insulin: Secondary | ICD-10-CM

## 2016-04-16 DIAGNOSIS — E1165 Type 2 diabetes mellitus with hyperglycemia: Secondary | ICD-10-CM

## 2016-04-16 DIAGNOSIS — Z23 Encounter for immunization: Secondary | ICD-10-CM | POA: Diagnosis not present

## 2016-04-16 NOTE — Patient Instructions (Addendum)
Check blood sugars on waking up  daily  Also check blood sugars about 2 hours after a meal and do this after different meals by rotation  Recommended blood sugar levels on waking up is 90-130 and about 2 hours after meal is 130-160  Please bring your blood sugar monitor to each visit, thank you  INSULIN doses:  TOUJEO: Take 70 units in the morning and 80 in the evening  Humulin U-500: Take 20 or 20 5 in the morning based on blood sugar level  LUNCHTIME take at least 50-60 units, take larger amounts when eating a bigger meal  SUPPERTIME: Take 80 units consistently regardless of blood sugar before eating to keep blood sugar from going up over 180 after eating  Avoid high-fat foods, fried foods and fatty meats

## 2016-04-16 NOTE — Progress Notes (Signed)
Patient ID: [name], male   DOB: 26-Mar-1951, 65 y.o.   MRN: [id number]           Reason for Appointment: Follow-up for Type 2 Diabetes  Referring physician: Dareen Piano  History of Present Illness:          Date of diagnosis of type 2 diabetes mellitus: 2007       Background history:   [name] was diagnosed to have diabetes when [name] had an ulcer on his left third toe which led to amputation.  His glucose was apparently about 400 and [name] was started on insulin at that time [name] does not know if [name] has taken any oral hypoglycemic drugs in the past His A1c in 2016 has been consistently over 8%  Recent history:   INSULIN regimen is:  TOUJEO  70 in the morning and 100 in the evening HUMULIN U-500 insulin: 20 units in the morning, 40-60 at lunch and 70 at supper  Non-insulin hypoglycemic drugs the patient is taking are: Victoza 1.8 mg daily       On his initial consultation was started on Victoza and subsequently started on U-500 insulin in 2/17 along with Toujeo  Blood sugars have been difficult to control and A1c was last still high at 9.1, previously 10%  Current blood sugar patterns and problems identified:  FASTING blood sugars are somewhat variable and overall still high although occasionally will have readings below 140  Since [name] is usually not eating much at breakfast blood sugars are usually overall much better at noon  Blood sugars tend to be higher in the afternoon but quite variable before suppertime  [name] has only a few readings postprandially after supper which is usually at 5-6 PM and these are mostly high  May rarely forget to take insulin  [name] is eating out more recently and not always making the best choices  [name] is trying to take the insulin 30 minutes before eating but does not always do this  HUMULIN: [name] is mostly adjusting this based on what the blood sugars before a meal rather than what [name] is eating  Still has difficulty losing weight  Not able to exercise  because of continued nonhealing ulcer  No hypoglycemia, lowest blood sugar 70 Side effects from medications have been: None  Compliance with the medical regimen: Fair  Hypoglycemia:   rarely acl with one reading of 62  Glucose monitoring:  done up to 3  times a day         Glucometer:  Accu-Chek     Blood Glucose readings by :  Mean values apply above for all meters except median for One Touch  PRE-MEAL Fasting Lunch Dinner Bedtime Overall  Glucose range: 116-227  70-194  116-269 159-317    Mean/median: 172  123  172  239  163    Self-care:  Typical meal intake: Breakfast is sometimes fruit like a banana, usually eating eggs, More eating out;  meat like sausage, ham and grits at lunch.    Dinner at 6-7 pm [name] drinks grapefruit or V-8 juice and diet drinks                Dietician consultation: 12/16               Exercise: not walking Because of foot ulcer    Weight history: Previous range 260-410  Wt Readings from Last 3 Encounters:  04/16/16 (!) 368 lb 12.8 oz (167.3 kg)  03/31/16 (!) 360  lb (163.3 kg)  02/17/16 (!) 370 lb (167.8 kg)    Glycemic control:   Lab Results  Component Value Date   HGBA1C 9.1 (H) 04/13/2016   HGBA1C 10.0 (H) 12/12/2015   HGBA1C 8.5 (H) 08/09/2015   Lab Results  Component Value Date   MICROALBUR 14.9 (H) 04/13/2016   LDLCALC 53 08/09/2015   CREATININE 1.35 04/13/2016   Lab on 04/13/2016  Component Date Value Ref Range Status  . Hgb A1c MFr Bld 04/13/2016 9.1* 4.6 - 6.5 % Final  . Sodium 04/13/2016 137  135 - 145 mEq/L Final  . Potassium 04/13/2016 3.9  3.5 - 5.1 mEq/L Final  . Chloride 04/13/2016 105  96 - 112 mEq/L Final  . CO2 04/13/2016 27  19 - 32 mEq/L Final  . Glucose, Bld 04/13/2016 132* 70 - 99 mg/dL Final  . BUN 04/13/2016 19  6 - 23 mg/dL Final  . Creatinine, Ser 04/13/2016 1.35  0.40 - 1.50 mg/dL Final  . Total Bilirubin 04/13/2016 0.4  0.2 - 1.2 mg/dL Final  . Alkaline Phosphatase 04/13/2016 126* 39 - 117 U/L Final    . AST 04/13/2016 13  0 - 37 U/L Final  . ALT 04/13/2016 13  0 - 53 U/L Final  . Total Protein 04/13/2016 7.3  6.0 - 8.3 g/dL Final  . Albumin 04/13/2016 3.5  3.5 - 5.2 g/dL Final  . Calcium 04/13/2016 8.8  8.4 - 10.5 mg/dL Final  . GFR 04/13/2016 67.98  >60.00 mL/min Final  . Microalb, Ur 04/13/2016 14.9* 0.0 - 1.9 mg/dL Final  . Creatinine,U 04/13/2016 89.7  mg/dL Final  . Microalb Creat Ratio 04/13/2016 16.6  0.0 - 30.0 mg/g Final         Medication List       Accurate as of 04/16/16  4:32 PM. Always use your most recent med list.          ACCU-CHEK AVIVA PLUS test strip Generic drug:  glucose blood USE TO CHECK BLOOD SUGAR FOUR TIMES DAILY DAILY BEFORE MEALS AND AT BEDTIME   ACCU-CHEK AVIVA PLUS w/Device Kit 1 each by Does not apply route 4 (four) times daily -  with meals and at bedtime.   ACCU-CHEK FASTCLIX LANCETS Misc Check blood sugar 4 times a day before meals and bedtime dx code 250.02 insulin requiring   acetaminophen 500 MG tablet Commonly known as:  TYLENOL Take 1 tablet (500 mg total) by mouth every 4 (four) hours as needed. Maximum Daily Dosage of 2000 mg   albuterol 108 (90 Base) MCG/ACT inhaler Commonly known as:  PROAIR HFA Inhale 2 puffs into the lungs every 6 (six) hours as needed for wheezing or shortness of breath.   atorvastatin 20 MG tablet Commonly known as:  LIPITOR TAKE 1 TABLET(20 MG) BY MOUTH DAILY   BESIVANCE 0.6 % Susp Generic drug:  Besifloxacin HCl Place 1 drop into both eyes 4 (four) times daily.   cadexomer iodine 0.9 % gel Commonly known as:  IODOSORB Apply 1 application topically daily as needed for wound care.   capsaicin 0.025 % cream Commonly known as:  ZOSTRIX Apply 1 application topically daily as needed (for pain).   docusate sodium 100 MG capsule Commonly known as:  COLACE Take 1 capsule (100 mg total) by mouth 2 (two) times daily.   ELIQUIS 5 MG Tabs tablet Generic drug:  apixaban TAKE 1 TABLET(5 MG) BY MOUTH  TWICE DAILY   enalapril 20 MG tablet Commonly known as:  VASOTEC TAKE  2 TABLETS(40 MG) BY MOUTH DAILY   furosemide 40 MG tablet Commonly known as:  LASIX TAKE 1 TABLET(40 MG) BY MOUTH TWICE DAILY   HYDROcodone-acetaminophen 5-325 MG tablet Commonly known as:  NORCO/VICODIN Take 1 and 1/2 tablets by mouth every 6 hours as needed for pain   Insulin Glargine 300 UNIT/ML Sopn Commonly known as:  TOUJEO SOLOSTAR Inject 60-90 Units into the skin 2 (two) times daily.   Insulin Pen Needle 31G X 5 MM Misc Use for insulin injection 5 times a day.   insulin regular human CONCENTRATED 500 UNIT/ML kwikpen Commonly known as:  HUMULIN R Inject 20 units at breakfast, 60 units at lunch and 70 units at dinner.   LUMIGAN 0.01 % Soln Generic drug:  bimatoprost Place 1 drop into both eyes at bedtime.   metoprolol succinate 25 MG 24 hr tablet Commonly known as:  TOPROL-XL Take 1 tablet (25 mg total) by mouth daily.   omeprazole 20 MG capsule Commonly known as:  PRILOSEC TAKE 1 CAPSULE(20 MG) BY MOUTH DAILY   pregabalin 25 MG capsule Commonly known as:  LYRICA Take 1 capsule (25 mg total) by mouth 3 (three) times daily.   promethazine 25 MG tablet Commonly known as:  PHENERGAN TAKE 1/2 TO 1 TABLET BY MOUTH EVERY 6 HOURS AS NEEDED FOR NAUSEA   silver sulfADIAZINE 1 % cream Commonly known as:  SILVADENE Apply 1 application topically daily.   SIMBRINZA 1-0.2 % Susp Generic drug:  Brinzolamide-Brimonidine Place 1 drop into both eyes 2 (two) times daily. Take as instructed by Dr. Katy Fitch.   VICTOZA 18 MG/3ML Sopn Generic drug:  Liraglutide Inject 1.8 mg once daily at the same time   Vitamin D3 2000 units capsule Take 1 capsule (2,000 Units total) by mouth daily.       Allergies:  Allergies  Allergen Reactions  . Vancomycin     REACTION: ARF    Past Medical History:  Diagnosis Date  . Arthritis    "elbows & knees" (12/13/2014)  . Asthma   . CKD (chronic kidney disease),  stage III   . DIABETIC FOOT ULCER 06/20/2009  . Edema, macular, due to secondary diabetes ([place])   . Erectile dysfunction   . GERD (gastroesophageal reflux disease)   . HEARING LOSS, SENSORINEURAL, BILATERAL 01/03/2007   Seen by ENT Dr. Orpah Greek D. Redmond Baseman 01/03/07  . Hemorrhoids   . History of echocardiogram    a. 04/2008 Echo: EF 50-55%, abnl LV relaxation, mildly dil LA.  Marland Kitchen Hyperlipidemia   . Hypertension   . Morbid obesity ([place])   . Neuropathy, lower extremity   . OSA on CPAP    Nocturnal polysomnogram on 01/21/2010 showed severe obstructive sleep apnea/hypopnea syndrome, AHI 74.1 per hour with non positional events, moderately loud snoring, and oxygen desaturation to a nadir of 78% on room air.  CPAP was successfully titrated to 17 CWP, AHI 1.1 per hour using a large ResMed Mirage Quattro full-face mask with heated humidifier. Bruxism was noted.   . Osteomyelitis of ankle and foot ([place])   . Retinopathy   . Type II diabetes mellitus (HCC)    w/complication NOS, type II    Past Surgical History:  Procedure Laterality Date  . AMPUTATION Left 12/15/2014   Procedure: LEFT SECOND TOE AMPUTATION ;  Surgeon: Dorna Leitz, MD;  Location: [facility];  Service: Orthopedics;  Laterality: Left;  . TOE AMPUTATION Left 01/21/2006   S/P radical irrigation and debridement, left foot with third MTP joint amputation by  Dr. Kathalene Frames. Mayer Camel.    Family History  Problem Relation Age of Onset  . Diabetes Mother     also 2 siblings  . Heart attack Father 60  . Throat cancer Brother     Social History:  reports that [name] quit smoking about 6 years ago. His smoking use included Cigarettes. [name] has a 15.00 pack-year smoking history. [name] has never used smokeless tobacco. [name] reports that [name] drinks about 6.6 oz of alcohol per week . [name] reports that [name] does not use drugs.    Review of Systems    Lipid management: taking Lipitor 20 mg, followed by PCP, Has low HDL also    Lab Results  Component Value Date   CHOL 110  08/09/2015   HDL 27.40 (L) 08/09/2015   LDLCALC 53 08/09/2015   TRIG 149.0 08/09/2015   CHOLHDL 4 08/09/2015            Hypertension: Has had hypertension for a few years treated with enalapril,  taking 40 mg in the morning Also on Lasix and low-dose metoprolol Medications adjusted by primary care physician   Last foot exam was in 10/16 Has been seen regularly by podiatrist for ulcer, still not completely healed   Physical Examination:  BP (!) 101/53   Pulse 79   Temp 97.9 F (36.6 C)   Resp 16   Ht _0  (1.981 m)   Wt (!) 368 lb 12.8 oz (167.3 kg)   SpO2 96%   BMI 42.62 kg/m       ASSESSMENT:  Diabetes type 2, uncontrolled with morbid obesity  See history of present illness for detailed discussion of current diabetes management, blood sugar patterns and problems identified [name] is  quite insulin resistant and taking large amounts of insulin but with inadequate control A1c is still over 9%  Tends to have higher readings after meals but occasionally in the morning also and highest readings are after supper This is despite his taking 70 units of the U-500 insulin before suppertime [name] will reduce his insulin before meals if the blood sugars are normal which may tend to cause some higher readings after the meal Fasting readings are fluctuating less but still overall averaging 170 or so  Although [name] may be a candidate for Jardiance [name] has variable renal function and also taking diuretics and may be difficult to adjust this  HYPERTENSION:  Blood pressure low normal, to follow-up with PCP    PLAN:     New insulin doses as in patient instructions  Try to take insulin consistently before meals  Better choices with meal planning especially with eating out  More readings after supper   TOUJEO: Take 70 units in the morning and 80 in the evening  Humulin U-500: Take 20 or 20 5 in the morning based on blood sugar level  LUNCHTIME take at least 50-60 units, take  larger amounts when eating a bigger meal  SUPPERTIME: Take 80 units consistently regardless of blood sugar before eating to keep blood sugar from going up over 180 after eating  Avoid high-fat foods, fried foods and fatty meats  Influenza vaccine given   Patient Instructions  Check blood sugars on waking up  daily  Also check blood sugars about 2 hours after a meal and do this after different meals by rotation  Recommended blood sugar levels on waking up is 90-130 and about 2 hours after meal is 130-160  Please bring your blood sugar monitor to each visit,  thank you  INSULIN doses:  TOUJEO: Take 70 units in the morning and 80 in the evening  Humulin U-500: Take 20 or 20 5 in the morning based on blood sugar level  LUNCHTIME take at least 50-60 units, take larger amounts when eating a bigger meal  SUPPERTIME: Take 80 units consistently regardless of blood sugar before eating to keep blood sugar from going up over 180 after eating  Avoid high-fat foods, fried foods and fatty meats    Counseling time on subjects discussed above is over 50% of today's 25 minute visit    [name] 04/16/2016, 4:32 PM   Note: This office note was prepared with Dragon voice recognition system technology. Any transcriptional errors that result from this process are unintentional.

## 2016-04-17 DIAGNOSIS — E11311 Type 2 diabetes mellitus with unspecified diabetic retinopathy with macular edema: Secondary | ICD-10-CM | POA: Diagnosis not present

## 2016-04-17 DIAGNOSIS — E1142 Type 2 diabetes mellitus with diabetic polyneuropathy: Secondary | ICD-10-CM | POA: Diagnosis not present

## 2016-04-17 DIAGNOSIS — G894 Chronic pain syndrome: Secondary | ICD-10-CM | POA: Diagnosis not present

## 2016-04-17 DIAGNOSIS — E1122 Type 2 diabetes mellitus with diabetic chronic kidney disease: Secondary | ICD-10-CM | POA: Diagnosis not present

## 2016-04-17 DIAGNOSIS — I129 Hypertensive chronic kidney disease with stage 1 through stage 4 chronic kidney disease, or unspecified chronic kidney disease: Secondary | ICD-10-CM | POA: Diagnosis not present

## 2016-04-17 DIAGNOSIS — E1165 Type 2 diabetes mellitus with hyperglycemia: Secondary | ICD-10-CM | POA: Diagnosis not present

## 2016-04-17 DIAGNOSIS — L97421 Non-pressure chronic ulcer of left heel and midfoot limited to breakdown of skin: Secondary | ICD-10-CM | POA: Diagnosis not present

## 2016-04-17 DIAGNOSIS — N183 Chronic kidney disease, stage 3 (moderate): Secondary | ICD-10-CM | POA: Diagnosis not present

## 2016-04-17 DIAGNOSIS — E11621 Type 2 diabetes mellitus with foot ulcer: Secondary | ICD-10-CM | POA: Diagnosis not present

## 2016-04-17 DIAGNOSIS — I480 Paroxysmal atrial fibrillation: Secondary | ICD-10-CM | POA: Diagnosis not present

## 2016-04-17 DIAGNOSIS — Z7901 Long term (current) use of anticoagulants: Secondary | ICD-10-CM | POA: Diagnosis not present

## 2016-04-21 ENCOUNTER — Encounter: Payer: Self-pay | Admitting: Sports Medicine

## 2016-04-21 ENCOUNTER — Ambulatory Visit (INDEPENDENT_AMBULATORY_CARE_PROVIDER_SITE_OTHER): Payer: Medicare Other | Admitting: Sports Medicine

## 2016-04-21 DIAGNOSIS — Z89432 Acquired absence of left foot: Secondary | ICD-10-CM

## 2016-04-21 DIAGNOSIS — L97522 Non-pressure chronic ulcer of other part of left foot with fat layer exposed: Secondary | ICD-10-CM

## 2016-04-21 DIAGNOSIS — E11621 Type 2 diabetes mellitus with foot ulcer: Secondary | ICD-10-CM

## 2016-04-21 DIAGNOSIS — E1140 Type 2 diabetes mellitus with diabetic neuropathy, unspecified: Secondary | ICD-10-CM

## 2016-04-21 DIAGNOSIS — M79672 Pain in left foot: Secondary | ICD-10-CM

## 2016-04-21 DIAGNOSIS — Z01818 Encounter for other preprocedural examination: Secondary | ICD-10-CM

## 2016-04-21 DIAGNOSIS — IMO0002 Reserved for concepts with insufficient information to code with codable children: Secondary | ICD-10-CM

## 2016-04-21 MED ORDER — AMOXICILLIN-POT CLAVULANATE 875-125 MG PO TABS: 1.0000 | ORAL_TABLET | Freq: Two times a day (BID) | ORAL | 0 refills | Status: DC

## 2016-04-21 NOTE — Progress Notes (Signed)
Patient ID: [name], male   DOB: Jul 02, 1951, 65 y.o.   MRN: [id number]  Subjective: [name] is a 65 y.o. male patient seen in office for follow up evaluation of ulceration of the left foot. Patient has a history of diabetes and a blood glucose level today not recorded.  Patient has nursing helping to change the dressing 2-3x/week applying PRISMA to site. Denies nausea/fever/vomiting/chills/night sweats/shortness of breath/pain. Patient has no other pedal complaints at this time.  Patient Active Problem List   Diagnosis Date Noted  . Chronic pain syndrome 09/18/2015  . Chronic fatigue 04/16/2015  . Preventative health care 03/12/2015  . Family history of coronary artery disease in father 12/14/2014  . Sleep apnea-on C-pap 12/14/2014  . Acute osteomyelitis of toe of left foot ([place])   . Diabetic foot infection ([place]) 12/13/2014  . Foot ulcer, left ([place]) 12/13/2014  . Paroxysmal atrial fibrillation, new onset 12/13/2014  . Elevated alkaline phosphatase level 06/11/2014  . Constipation 05/31/2014  . Vitamin D deficiency 11/17/2013  . Frequent falls 06/30/2013  . Mild memory disturbance 07/27/2012  . Anemia 02/10/2012  . Hypertension 11/18/2011  . Obstructive sleep apnea 12/12/2009  . EDEMA 02/13/2009  . GERD 06/27/2008  . Diabetes type 2, uncontrolled ([place]) 12/28/2007  . DIABETIC MACULAR EDEMA 10/07/2007  . Dyslipidemia 08/15/2007  . BACKGROUND DIABETIC RETINOPATHY 08/15/2007  . ERECTILE DYSFUNCTION 05/31/2007  . HEARING LOSS, SENSORINEURAL, BILATERAL 01/03/2007  . Diabetic peripheral neuropathy associated with type 2 diabetes mellitus ([place]) 12/16/2006  . Chronic kidney disease, stage III (moderate) 12/01/2006  . Obesity BMI 46 08/23/2006  . STATUS, OTHER TOE(S) AMPUTATION 05/21/2006   Current Outpatient Prescriptions on File Prior to Visit  Medication Sig Dispense Refill  . ACCU-CHEK AVIVA PLUS test strip USE TO CHECK BLOOD SUGAR FOUR TIMES DAILY DAILY BEFORE  MEALS AND AT BEDTIME 150 each 0  . ACCU-CHEK FASTCLIX LANCETS MISC Check blood sugar 4 times a day before meals and bedtime dx code 250.02 insulin requiring 102 each 6  . acetaminophen (TYLENOL) 500 MG tablet Take 1 tablet (500 mg total) by mouth every 4 (four) hours as needed. Maximum Daily Dosage of 2000 mg 100 tablet 0  . albuterol (PROAIR HFA) 108 (90 Base) MCG/ACT inhaler Inhale 2 puffs into the lungs every 6 (six) hours as needed for wheezing or shortness of breath. 18 g 3  . atorvastatin (LIPITOR) 20 MG tablet TAKE 1 TABLET(20 MG) BY MOUTH DAILY 90 tablet 3  . BESIVANCE 0.6 % SUSP Place 1 drop into both eyes 4 (four) times daily.   12  . Blood Glucose Monitoring Suppl (ACCU-CHEK AVIVA PLUS) W/DEVICE KIT 1 each by Does not apply route 4 (four) times daily -  with meals and at bedtime. 1 kit 0  . cadexomer iodine (IODOSORB) 0.9 % gel Apply 1 application topically daily as needed for wound care. 40 g 0  . capsaicin (ZOSTRIX) 0.025 % cream Apply 1 application topically daily as needed (for pain).    . Cholecalciferol (VITAMIN D3) 2000 UNITS capsule Take 1 capsule (2,000 Units total) by mouth daily. 90 capsule 0  . docusate sodium (COLACE) 100 MG capsule Take 1 capsule (100 mg total) by mouth 2 (two) times daily. 60 capsule 0  . ELIQUIS 5 MG TABS tablet TAKE 1 TABLET(5 MG) BY MOUTH TWICE DAILY 180 tablet 0  . enalapril (VASOTEC) 20 MG tablet TAKE 2 TABLETS(40 MG) BY MOUTH DAILY 90 tablet 3  . furosemide (LASIX) 40 MG tablet TAKE 1  TABLET(40 MG) BY MOUTH TWICE DAILY 60 tablet 0  . HYDROcodone-acetaminophen (NORCO/VICODIN) 5-325 MG tablet Take 1 and 1/2 tablets by mouth every 6 hours as needed for pain 140 tablet 0  . Insulin Glargine (TOUJEO SOLOSTAR) 300 UNIT/ML SOPN Inject 60-90 Units into the skin 2 (two) times daily. (Patient taking differently: Inject 70-100 Units into the skin 2 (two) times daily. ) 45 pen 2  . Insulin Pen Needle 31G X 5 MM MISC Use for insulin injection 5 times a day. 150  each 5  . insulin regular human CONCENTRATED (HUMULIN R) 500 UNIT/ML kwikpen Inject 20 units at breakfast, 60 units at lunch and 70 units at dinner. 18 mL 3  . LUMIGAN 0.01 % SOLN Place 1 drop into both eyes at bedtime.     . metoprolol succinate (TOPROL-XL) 25 MG 24 hr tablet Take 1 tablet (25 mg total) by mouth daily. 90 tablet 3  . omeprazole (PRILOSEC) 20 MG capsule TAKE 1 CAPSULE(20 MG) BY MOUTH DAILY 90 capsule 0  . pregabalin (LYRICA) 25 MG capsule Take 1 capsule (25 mg total) by mouth 3 (three) times daily. 90 capsule 2  . promethazine (PHENERGAN) 25 MG tablet TAKE 1/2 TO 1 TABLET BY MOUTH EVERY 6 HOURS AS NEEDED FOR NAUSEA 30 tablet 0  . silver sulfADIAZINE (SILVADENE) 1 % cream Apply 1 application topically daily. 50 g 0  . SIMBRINZA 1-0.2 % SUSP Place 1 drop into both eyes 2 (two) times daily. Take as instructed by Dr. Katy Fitch.    Marland Kitchen VICTOZA 18 MG/3ML SOPN Inject 1.8 mg once daily at the same time 9 mL 3   No current facility-administered medications on file prior to visit.    Allergies  Allergen Reactions  . Vancomycin     REACTION: ARF    Recent Results (from the past 2160 hour(s))  Comprehensive metabolic panel     Status: Abnormal   Collection Time: 02/11/16 10:11 AM  Result Value Ref Range   Sodium 136 135 - 145 mEq/L   Potassium 5.0 3.5 - 5.1 mEq/L   Chloride 105 96 - 112 mEq/L   CO2 24 19 - 32 mEq/L   Glucose, Bld 112 (H) 70 - 99 mg/dL   BUN 27 (H) 6 - 23 mg/dL   Creatinine, Ser 1.78 (H) 0.40 - 1.50 mg/dL   Total Bilirubin 0.4 0.2 - 1.2 mg/dL   Alkaline Phosphatase 143 (H) 39 - 117 U/L   AST 15 0 - 37 U/L   ALT 17 0 - 53 U/L   Total Protein 7.9 6.0 - 8.3 g/dL   Albumin 3.9 3.5 - 5.2 g/dL   Calcium 9.9 8.4 - 10.5 mg/dL   GFR 49.43 (L) >60.00 mL/min  Fructosamine     Status: Abnormal   Collection Time: 02/11/16 10:11 AM  Result Value Ref Range   Fructosamine 306 (H) 0 - 285 umol/L    Comment: Published reference interval for apparently healthy subjects between  age 49 and 102 is 42 - 285 umol/L and in a poorly controlled diabetic population is 228 - 563 umol/L with a mean of 396 umol/L.   Hemoglobin A1c     Status: Abnormal   Collection Time: 04/13/16  9:57 AM  Result Value Ref Range   Hgb A1c MFr Bld 9.1 (H) 4.6 - 6.5 %    Comment: Glycemic Control Guidelines for People with Diabetes:Non Diabetic:  <6%Goal of Therapy: <7%Additional Action Suggested:  >8%   Comprehensive metabolic panel  Status: Abnormal   Collection Time: 04/13/16  9:57 AM  Result Value Ref Range   Sodium 137 135 - 145 mEq/L   Potassium 3.9 3.5 - 5.1 mEq/L   Chloride 105 96 - 112 mEq/L   CO2 27 19 - 32 mEq/L   Glucose, Bld 132 (H) 70 - 99 mg/dL   BUN 19 6 - 23 mg/dL   Creatinine, Ser 1.35 0.40 - 1.50 mg/dL   Total Bilirubin 0.4 0.2 - 1.2 mg/dL   Alkaline Phosphatase 126 (H) 39 - 117 U/L   AST 13 0 - 37 U/L   ALT 13 0 - 53 U/L   Total Protein 7.3 6.0 - 8.3 g/dL   Albumin 3.5 3.5 - 5.2 g/dL   Calcium 8.8 8.4 - 10.5 mg/dL   GFR 67.98 >60.00 mL/min  Microalbumin / creatinine urine ratio     Status: Abnormal   Collection Time: 04/13/16  9:57 AM  Result Value Ref Range   Microalb, Ur 14.9 (H) 0.0 - 1.9 mg/dL   Creatinine,U 89.7 mg/dL   Microalb Creat Ratio 16.6 0.0 - 30.0 mg/g   Family History  Problem Relation Age of Onset  . Diabetes Mother     also 2 siblings  . Heart attack Father 63  . Throat cancer Brother     Past Surgical History:  Procedure Laterality Date  . AMPUTATION Left 12/15/2014   Procedure: LEFT SECOND TOE AMPUTATION ;  Surgeon: Dorna Leitz, MD;  Location: [facility];  Service: Orthopedics;  Laterality: Left;  . 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 Kathalene Frames. Mayer Camel.    Objective: There were no vitals filed for this visit.  General: Patient is awake, alert, oriented x 3 and in no acute distress.  Dermatology: Skin is warm and dry bilateral with a now full  thickness ulceration present Left plantar forefoot. Ulceration measures 2cm x 3cm x 0.6cm  (last measurement smaller) extending into skin crease at amp site. There is a moderate keratotic border with a granular base. The ulceration does not probe to bone. There is mild malodor, no active drainage, no erythema, decreasd swelling without warmth to dorsum of foot. No other acute signs of infection.   Vascular: Dorsalis Pedis pulse = 0/4 Bilateral,  Posterior Tibial pulse = 1/4 Bilateral,  Capillary Fill Time < 5 seconds at remaining toes   Neurologic: Protective sensation absent bilateral using SWMF.  Musculosketal: No Pain with palpation to ulcerated area on left. No pain with compression to calves bilateral. 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  Assessment and Plan:  Problem List Items Addressed This Visit    None    Visit Diagnoses    Diabetic ulcer of left foot associated with type 2 diabetes mellitus, with fat layer exposed, unspecified part of foot ([place])    -  Primary   Relevant Medications   amoxicillin-clavulanate (AUGMENTIN) 875-125 MG tablet   Foot amputation status, left (HCC)       Relevant Medications   amoxicillin-clavulanate (AUGMENTIN) 875-125 MG tablet   Type 2 diabetes mellitus with diabetic neuropathy, unspecified long term insulin use status (HCC)       Relevant Medications   amoxicillin-clavulanate (AUGMENTIN) 875-125 MG tablet   Foot pain, left       Relevant Medications   amoxicillin-clavulanate (AUGMENTIN) 875-125 MG tablet   Pre-operative exam         -Examined patient and discussed the  progression of the wound and treatment alternatives. - Excisionally dedbrided ulceration to healthy bleeding borders using a sterile chisel blade.  -Applied Prisma Ag and dry sterile dressing and instructed patient continue with home nursing 3x per week consisting of PRISMA and dry sterile dressing. -Patient opt for surgical management. Consent obtained for Left wound  debridement with placement of stravix allograft. Pre and Post op course explained. Risks, benefits, alternatives explained. No guarantees given or implied. Surgical booking slip submitted and provided patient with Surgical packet and info for [facility] forefoot offloading post op shoe to use daily to left foot and after surgery -Advised patient to refrain from getting the ulceration wet and to limit activity to necessity -Refilled Augmentin for preventative measures -Advised patient to go to the ER or return to office if the wound worsens or if constitutional symptoms are present. -Patient to return to office 3 weeks for follow up ulceration care or after surgery if schedule before next ulcer check or sooner if problems arise.  Landis Martins, DPM

## 2016-04-21 NOTE — Patient Instructions (Signed)
Pre-Operative Instructions  Congratulations, you have decided to take an important step to improving your quality of life.  You can be assured that the doctors of [facility] will be with you every step of the way.  1. Plan to be at the surgery center/hospital at least 1 (one) hour prior to your scheduled time unless otherwise directed by the surgical center/hospital staff.  You must have a responsible adult accompany you, remain during the surgery and drive you home.  Make sure you have directions to the surgical center/hospital and know how to get there on time. 2. For hospital based surgery you will need to obtain a history and physical form from your family physician within 1 month prior to the date of surgery- we will give you a form for you primary physician.  3. We make every effort to accommodate the date you request for surgery.  There are however, times where surgery dates or times have to be moved.  We will contact you as soon as possible if a change in schedule is required.   4. No Aspirin/Ibuprofen for one week before surgery.  If you are on aspirin, any non-steroidal anti-inflammatory medications (Mobic, Aleve, Ibuprofen) you should stop taking it 7 days prior to your surgery.  You make take Tylenol  For pain prior to surgery.  5. Medications- If you are taking daily heart and blood pressure medications, seizure, reflux, allergy, asthma, anxiety, pain or diabetes medications, make sure the surgery center/hospital is aware before the day of surgery so they may notify you which medications to take or avoid the day of surgery. 6. No food or drink after midnight the night before surgery unless directed otherwise by surgical center/hospital staff. 7. No alcoholic beverages 24 hours prior to surgery.  No smoking 24 hours prior to or 24 hours after surgery. 8. Wear loose pants or shorts- loose enough to fit over bandages, boots, and casts. 9. No slip on shoes, sneakers are best. 10. Bring  your boot with you to the surgery center/hospital.  Also bring crutches or a walker if your physician has prescribed it for you.  If you do not have this equipment, it will be provided for you after surgery. 11. If you have not been contracted by the surgery center/hospital by the day before your surgery, call to confirm the date and time of your surgery. 12. Leave-time from work may vary depending on the type of surgery you have.  Appropriate arrangements should be made prior to surgery with your employer. 13. Prescriptions will be provided immediately following surgery by your doctor.  Have these filled as soon as possible after surgery and take the medication as directed. 14. Remove nail polish on the operative foot. 15. Wash the night before surgery.  The night before surgery wash the foot and leg well with the antibacterial soap provided and water paying special attention to beneath the toenails and in between the toes.  Rinse thoroughly with water and dry well with a towel.  Perform this wash unless told not to do so by your physician.  Enclosed: 1 Ice pack (please put in freezer the night before surgery)   1 Hibiclens skin cleaner   Pre-op Instructions  If you have any questions regarding the instructions, do not hesitate to call our office.  [place]: [address] [place], [place] [address] [phone]  [place]: [address] [place], [place] [address] [phone]  [place]: [address]  [place], [place] [address] [phone]   Dr.   Norman Regal DPM, Dr. Matthew Wagoner DPM, Dr. M. Todd Hyatt DPM, Dr. Roniesha Hollingshead DPM 

## 2016-04-22 DIAGNOSIS — L97421 Non-pressure chronic ulcer of left heel and midfoot limited to breakdown of skin: Secondary | ICD-10-CM | POA: Diagnosis not present

## 2016-04-22 DIAGNOSIS — Z7901 Long term (current) use of anticoagulants: Secondary | ICD-10-CM | POA: Diagnosis not present

## 2016-04-22 DIAGNOSIS — G894 Chronic pain syndrome: Secondary | ICD-10-CM | POA: Diagnosis not present

## 2016-04-22 DIAGNOSIS — E11311 Type 2 diabetes mellitus with unspecified diabetic retinopathy with macular edema: Secondary | ICD-10-CM | POA: Diagnosis not present

## 2016-04-22 DIAGNOSIS — I129 Hypertensive chronic kidney disease with stage 1 through stage 4 chronic kidney disease, or unspecified chronic kidney disease: Secondary | ICD-10-CM | POA: Diagnosis not present

## 2016-04-22 DIAGNOSIS — I480 Paroxysmal atrial fibrillation: Secondary | ICD-10-CM | POA: Diagnosis not present

## 2016-04-22 DIAGNOSIS — E1122 Type 2 diabetes mellitus with diabetic chronic kidney disease: Secondary | ICD-10-CM | POA: Diagnosis not present

## 2016-04-22 DIAGNOSIS — E11621 Type 2 diabetes mellitus with foot ulcer: Secondary | ICD-10-CM | POA: Diagnosis not present

## 2016-04-22 DIAGNOSIS — E1165 Type 2 diabetes mellitus with hyperglycemia: Secondary | ICD-10-CM | POA: Diagnosis not present

## 2016-04-22 DIAGNOSIS — N183 Chronic kidney disease, stage 3 (moderate): Secondary | ICD-10-CM | POA: Diagnosis not present

## 2016-04-22 DIAGNOSIS — E1142 Type 2 diabetes mellitus with diabetic polyneuropathy: Secondary | ICD-10-CM | POA: Diagnosis not present

## 2016-04-24 DIAGNOSIS — E1122 Type 2 diabetes mellitus with diabetic chronic kidney disease: Secondary | ICD-10-CM | POA: Diagnosis not present

## 2016-04-24 DIAGNOSIS — G894 Chronic pain syndrome: Secondary | ICD-10-CM | POA: Diagnosis not present

## 2016-04-24 DIAGNOSIS — I129 Hypertensive chronic kidney disease with stage 1 through stage 4 chronic kidney disease, or unspecified chronic kidney disease: Secondary | ICD-10-CM | POA: Diagnosis not present

## 2016-04-24 DIAGNOSIS — E1142 Type 2 diabetes mellitus with diabetic polyneuropathy: Secondary | ICD-10-CM | POA: Diagnosis not present

## 2016-04-24 DIAGNOSIS — G4733 Obstructive sleep apnea (adult) (pediatric): Secondary | ICD-10-CM | POA: Diagnosis not present

## 2016-04-24 DIAGNOSIS — E11311 Type 2 diabetes mellitus with unspecified diabetic retinopathy with macular edema: Secondary | ICD-10-CM | POA: Diagnosis not present

## 2016-04-24 DIAGNOSIS — E11621 Type 2 diabetes mellitus with foot ulcer: Secondary | ICD-10-CM | POA: Diagnosis not present

## 2016-04-24 DIAGNOSIS — E1165 Type 2 diabetes mellitus with hyperglycemia: Secondary | ICD-10-CM | POA: Diagnosis not present

## 2016-04-24 DIAGNOSIS — N183 Chronic kidney disease, stage 3 (moderate): Secondary | ICD-10-CM | POA: Diagnosis not present

## 2016-04-24 DIAGNOSIS — I480 Paroxysmal atrial fibrillation: Secondary | ICD-10-CM | POA: Diagnosis not present

## 2016-04-24 DIAGNOSIS — L97421 Non-pressure chronic ulcer of left heel and midfoot limited to breakdown of skin: Secondary | ICD-10-CM | POA: Diagnosis not present

## 2016-04-24 DIAGNOSIS — Z7901 Long term (current) use of anticoagulants: Secondary | ICD-10-CM | POA: Diagnosis not present

## 2016-04-27 DIAGNOSIS — Z7901 Long term (current) use of anticoagulants: Secondary | ICD-10-CM | POA: Diagnosis not present

## 2016-04-27 DIAGNOSIS — E1142 Type 2 diabetes mellitus with diabetic polyneuropathy: Secondary | ICD-10-CM | POA: Diagnosis not present

## 2016-04-27 DIAGNOSIS — E11311 Type 2 diabetes mellitus with unspecified diabetic retinopathy with macular edema: Secondary | ICD-10-CM | POA: Diagnosis not present

## 2016-04-27 DIAGNOSIS — I129 Hypertensive chronic kidney disease with stage 1 through stage 4 chronic kidney disease, or unspecified chronic kidney disease: Secondary | ICD-10-CM | POA: Diagnosis not present

## 2016-04-27 DIAGNOSIS — E1122 Type 2 diabetes mellitus with diabetic chronic kidney disease: Secondary | ICD-10-CM | POA: Diagnosis not present

## 2016-04-27 DIAGNOSIS — I480 Paroxysmal atrial fibrillation: Secondary | ICD-10-CM | POA: Diagnosis not present

## 2016-04-27 DIAGNOSIS — L97421 Non-pressure chronic ulcer of left heel and midfoot limited to breakdown of skin: Secondary | ICD-10-CM | POA: Diagnosis not present

## 2016-04-27 DIAGNOSIS — G894 Chronic pain syndrome: Secondary | ICD-10-CM | POA: Diagnosis not present

## 2016-04-27 DIAGNOSIS — E11621 Type 2 diabetes mellitus with foot ulcer: Secondary | ICD-10-CM | POA: Diagnosis not present

## 2016-04-27 DIAGNOSIS — N183 Chronic kidney disease, stage 3 (moderate): Secondary | ICD-10-CM | POA: Diagnosis not present

## 2016-04-27 DIAGNOSIS — E1165 Type 2 diabetes mellitus with hyperglycemia: Secondary | ICD-10-CM | POA: Diagnosis not present

## 2016-04-28 ENCOUNTER — Other Ambulatory Visit: Payer: Self-pay

## 2016-04-28 NOTE — Patient Outreach (Signed)
[facility]) Care Management   04/28/2016  [name] [birth year] [id number]  [name] is an 64 y.o. male  Subjective:   I am going to have to have surgery on my left foot before Christmas. I measue my blood sugar 2-3 times per day, it usually run about 175.    Objective:   ROS Left foot dressing intact.   Physical Exam  Encounter Medications:   Outpatient Encounter Prescriptions as of 04/28/2016  Medication Sig Note  . ACCU-CHEK AVIVA PLUS test strip USE TO CHECK BLOOD SUGAR FOUR TIMES DAILY DAILY BEFORE MEALS AND AT BEDTIME   . ACCU-CHEK FASTCLIX LANCETS MISC Check blood sugar 4 times a day before meals and bedtime dx code 250.02 insulin requiring   . acetaminophen (TYLENOL) 500 MG tablet Take 1 tablet (500 mg total) by mouth every 4 (four) hours as needed. Maximum Daily Dosage of 2000 mg   . albuterol (PROAIR HFA) 108 (90 Base) MCG/ACT inhaler Inhale 2 puffs into the lungs every 6 (six) hours as needed for wheezing or shortness of breath.   Marland Kitchen amoxicillin-clavulanate (AUGMENTIN) 875-125 MG tablet Take 1 tablet by mouth 2 (two) times daily.   Marland Kitchen atorvastatin (LIPITOR) 20 MG tablet TAKE 1 TABLET(20 MG) BY MOUTH DAILY   . BESIVANCE 0.6 % SUSP Place 1 drop into both eyes 4 (four) times daily.    . Blood Glucose Monitoring Suppl (ACCU-CHEK AVIVA PLUS) W/DEVICE KIT 1 each by Does not apply route 4 (four) times daily -  with meals and at bedtime.   . cadexomer iodine (IODOSORB) 0.9 % gel Apply 1 application topically daily as needed for wound care.   . capsaicin (ZOSTRIX) 0.025 % cream Apply 1 application topically daily as needed (for pain).   . Cholecalciferol (VITAMIN D3) 2000 UNITS capsule Take 1 capsule (2,000 Units total) by mouth daily.   Marland Kitchen ELIQUIS 5 MG TABS tablet TAKE 1 TABLET(5 MG) BY MOUTH TWICE DAILY   . enalapril (VASOTEC) 20 MG tablet TAKE 2 TABLETS(40 MG) BY MOUTH DAILY   . furosemide (LASIX) 40 MG tablet TAKE 1 TABLET(40 MG) BY MOUTH  TWICE DAILY   . HYDROcodone-acetaminophen (NORCO/VICODIN) 5-325 MG tablet Take 1 and 1/2 tablets by mouth every 6 hours as needed for pain   . Insulin Glargine (TOUJEO SOLOSTAR) 300 UNIT/ML SOPN Inject 60-90 Units into the skin 2 (two) times daily. (Patient taking differently: Inject 70-100 Units into the skin 2 (two) times daily. )   . Insulin Pen Needle 31G X 5 MM MISC Use for insulin injection 5 times a day.   . insulin regular human CONCENTRATED (HUMULIN R) 500 UNIT/ML kwikpen Inject 20 units at breakfast, 60 units at lunch and 70 units at dinner.   Marland Kitchen LUMIGAN 0.01 % SOLN Place 1 drop into both eyes at bedtime.  03/28/2014: Prescribed by Dr. Katy Fitch.  . metoprolol succinate (TOPROL-XL) 25 MG 24 hr tablet Take 1 tablet (25 mg total) by mouth daily.   Marland Kitchen omeprazole (PRILOSEC) 20 MG capsule TAKE 1 CAPSULE(20 MG) BY MOUTH DAILY   . pregabalin (LYRICA) 25 MG capsule Take 1 capsule (25 mg total) by mouth 3 (three) times daily.   . promethazine (PHENERGAN) 25 MG tablet TAKE 1/2 TO 1 TABLET BY MOUTH EVERY 6 HOURS AS NEEDED FOR NAUSEA   . silver sulfADIAZINE (SILVADENE) 1 % cream Apply 1 application topically daily.   Marland Kitchen SIMBRINZA 1-0.2 % SUSP Place 1 drop into both eyes 2 (two) times daily. Take as  instructed by Dr. Katy Fitch. 11/02/2012: Prescribed by Dr. Katy Fitch.  Marland Kitchen VICTOZA 18 MG/3ML SOPN Inject 1.8 mg once daily at the same time   . docusate sodium (COLACE) 100 MG capsule Take 1 capsule (100 mg total) by mouth 2 (two) times daily. (Patient not taking: Reported on 04/28/2016)    No facility-administered encounter medications on file as of 04/28/2016.     Functional Status:   In your present state of health, do you have any difficulty performing the following activities: 04/15/2016 02/11/2016  Hearing? N N  Vision? Y N  Difficulty concentrating or making decisions? N N  Walking or climbing stairs? Y Y  Dressing or bathing? N N  Doing errands, shopping? Y N  Preparing Food and eating ? Y -  Using the Toilet?  N -  In the past six months, have you accidently leaked urine? N -  Do you have problems with loss of bowel control? N -  Managing your Medications? N -  Managing your Finances? N -  Housekeeping or managing your Housekeeping? Y -  Some recent data might be hidden    Fall/Depression Screening:    PHQ 2/9 Scores 04/15/2016 03/30/2016 03/25/2016 02/11/2016 01/01/2016 01/01/2016 09/17/2015  PHQ - 2 Score 0 1 0 0 0 0 0  PHQ- 9 Score - - - - - - -   THN CM Care Plan Problem One   Flowsheet Row Most Recent Value  Care Plan Problem One  patient has multiple chronic illnesses  Role Documenting the Problem One  Care Management Coordinator  Care Plan for Problem One  Active  THN Long Term Goal (31-90 days)  In the next 31 days, patient will have made and attended an  appoitnemnt with his primary care [place] Term Goal Start Date  03/30/16  [facility] Long Term Goal Met Date  04/28/16  Interventions for Problem One Long Term Goal  home visit for diabetic education  THN CM Short Term Goal #1 (0-30 days)  In the next 21 days, patient will have met with [facility] Coordinator for an assessment of his eduational needs  [facility] CM Short Term Goal #1 Start Date  03/30/16  Interventions for Short Term Goal #1  Home visit for diabetic education.  Patient has a hgb A1C level of 8.0    THN CM Care Plan Problem Two   Flowsheet Row Most Recent Value  Care Plan Problem Two  Patinet has a hgA1C of over 8.0  Role Documenting the Problem Two  Care Management [place] for Problem Two  Active  THN CM Short Term Goal #1 (0-30 days)  In the next 28 days, patient will meet with [facility] for education on low carbohydrate diets  THN CM Short Term Goal #1 Start Date  04/14/16  Interventions for Short Term Goal #2   Home visit to review patient's knowledge of low carbohydrate diets,       Assessment:   RNCM and patient viewed EMMI Videos on Diabetes and Home Safety/ Fall Prevention.  Patient listened  attentively to videos, asked questions.  Stated the videos were a  Nice review.  Plan:   Telephone contact later this month

## 2016-04-29 ENCOUNTER — Other Ambulatory Visit: Payer: Self-pay

## 2016-04-29 DIAGNOSIS — G894 Chronic pain syndrome: Secondary | ICD-10-CM | POA: Diagnosis not present

## 2016-04-29 DIAGNOSIS — E11311 Type 2 diabetes mellitus with unspecified diabetic retinopathy with macular edema: Secondary | ICD-10-CM | POA: Diagnosis not present

## 2016-04-29 DIAGNOSIS — Z7901 Long term (current) use of anticoagulants: Secondary | ICD-10-CM | POA: Diagnosis not present

## 2016-04-29 DIAGNOSIS — E1122 Type 2 diabetes mellitus with diabetic chronic kidney disease: Secondary | ICD-10-CM | POA: Diagnosis not present

## 2016-04-29 DIAGNOSIS — E11621 Type 2 diabetes mellitus with foot ulcer: Secondary | ICD-10-CM | POA: Diagnosis not present

## 2016-04-29 DIAGNOSIS — I480 Paroxysmal atrial fibrillation: Secondary | ICD-10-CM | POA: Diagnosis not present

## 2016-04-29 DIAGNOSIS — L97421 Non-pressure chronic ulcer of left heel and midfoot limited to breakdown of skin: Secondary | ICD-10-CM | POA: Diagnosis not present

## 2016-04-29 DIAGNOSIS — N183 Chronic kidney disease, stage 3 (moderate): Secondary | ICD-10-CM | POA: Diagnosis not present

## 2016-04-29 DIAGNOSIS — E1142 Type 2 diabetes mellitus with diabetic polyneuropathy: Secondary | ICD-10-CM | POA: Diagnosis not present

## 2016-04-29 DIAGNOSIS — I129 Hypertensive chronic kidney disease with stage 1 through stage 4 chronic kidney disease, or unspecified chronic kidney disease: Secondary | ICD-10-CM | POA: Diagnosis not present

## 2016-04-29 DIAGNOSIS — E1165 Type 2 diabetes mellitus with hyperglycemia: Secondary | ICD-10-CM | POA: Diagnosis not present

## 2016-04-29 NOTE — Telephone Encounter (Signed)
Requesting hydrocodone to be filled.  

## 2016-04-30 DIAGNOSIS — H359 Unspecified retinal disorder: Secondary | ICD-10-CM | POA: Diagnosis not present

## 2016-04-30 DIAGNOSIS — E113512 Type 2 diabetes mellitus with proliferative diabetic retinopathy with macular edema, left eye: Secondary | ICD-10-CM | POA: Diagnosis not present

## 2016-04-30 DIAGNOSIS — E113411 Type 2 diabetes mellitus with severe nonproliferative diabetic retinopathy with macular edema, right eye: Secondary | ICD-10-CM | POA: Diagnosis not present

## 2016-04-30 DIAGNOSIS — H43821 Vitreomacular adhesion, right eye: Secondary | ICD-10-CM | POA: Diagnosis not present

## 2016-04-30 LAB — HM DIABETES EYE EXAM

## 2016-05-01 ENCOUNTER — Other Ambulatory Visit: Payer: Medicare Other | Admitting: *Deleted

## 2016-05-01 DIAGNOSIS — E1142 Type 2 diabetes mellitus with diabetic polyneuropathy: Secondary | ICD-10-CM | POA: Diagnosis not present

## 2016-05-01 DIAGNOSIS — G894 Chronic pain syndrome: Secondary | ICD-10-CM | POA: Diagnosis not present

## 2016-05-01 DIAGNOSIS — I129 Hypertensive chronic kidney disease with stage 1 through stage 4 chronic kidney disease, or unspecified chronic kidney disease: Secondary | ICD-10-CM | POA: Diagnosis not present

## 2016-05-01 DIAGNOSIS — E11621 Type 2 diabetes mellitus with foot ulcer: Secondary | ICD-10-CM | POA: Diagnosis not present

## 2016-05-01 DIAGNOSIS — N183 Chronic kidney disease, stage 3 (moderate): Secondary | ICD-10-CM | POA: Diagnosis not present

## 2016-05-01 DIAGNOSIS — I480 Paroxysmal atrial fibrillation: Secondary | ICD-10-CM | POA: Diagnosis not present

## 2016-05-01 DIAGNOSIS — E1165 Type 2 diabetes mellitus with hyperglycemia: Secondary | ICD-10-CM | POA: Diagnosis not present

## 2016-05-01 DIAGNOSIS — E11311 Type 2 diabetes mellitus with unspecified diabetic retinopathy with macular edema: Secondary | ICD-10-CM | POA: Diagnosis not present

## 2016-05-01 DIAGNOSIS — Z7901 Long term (current) use of anticoagulants: Secondary | ICD-10-CM | POA: Diagnosis not present

## 2016-05-01 DIAGNOSIS — L97421 Non-pressure chronic ulcer of left heel and midfoot limited to breakdown of skin: Secondary | ICD-10-CM | POA: Diagnosis not present

## 2016-05-01 DIAGNOSIS — E1122 Type 2 diabetes mellitus with diabetic chronic kidney disease: Secondary | ICD-10-CM | POA: Diagnosis not present

## 2016-05-01 MED ORDER — HYDROCODONE-ACETAMINOPHEN 5-325 MG PO TABS: ORAL_TABLET | ORAL | 0 refills | Status: DC

## 2016-05-01 NOTE — Telephone Encounter (Signed)
Thank you :)

## 2016-05-01 NOTE — Telephone Encounter (Signed)
uds done appt 11/7

## 2016-05-03 ENCOUNTER — Other Ambulatory Visit: Payer: Self-pay | Admitting: Endocrinology

## 2016-05-04 DIAGNOSIS — L97421 Non-pressure chronic ulcer of left heel and midfoot limited to breakdown of skin: Secondary | ICD-10-CM | POA: Diagnosis not present

## 2016-05-04 DIAGNOSIS — E11311 Type 2 diabetes mellitus with unspecified diabetic retinopathy with macular edema: Secondary | ICD-10-CM | POA: Diagnosis not present

## 2016-05-04 DIAGNOSIS — Z7901 Long term (current) use of anticoagulants: Secondary | ICD-10-CM | POA: Diagnosis not present

## 2016-05-04 DIAGNOSIS — G894 Chronic pain syndrome: Secondary | ICD-10-CM | POA: Diagnosis not present

## 2016-05-04 DIAGNOSIS — E1122 Type 2 diabetes mellitus with diabetic chronic kidney disease: Secondary | ICD-10-CM | POA: Diagnosis not present

## 2016-05-04 DIAGNOSIS — I480 Paroxysmal atrial fibrillation: Secondary | ICD-10-CM | POA: Diagnosis not present

## 2016-05-04 DIAGNOSIS — I129 Hypertensive chronic kidney disease with stage 1 through stage 4 chronic kidney disease, or unspecified chronic kidney disease: Secondary | ICD-10-CM | POA: Diagnosis not present

## 2016-05-04 DIAGNOSIS — E1165 Type 2 diabetes mellitus with hyperglycemia: Secondary | ICD-10-CM | POA: Diagnosis not present

## 2016-05-04 DIAGNOSIS — E1142 Type 2 diabetes mellitus with diabetic polyneuropathy: Secondary | ICD-10-CM | POA: Diagnosis not present

## 2016-05-04 DIAGNOSIS — N183 Chronic kidney disease, stage 3 (moderate): Secondary | ICD-10-CM | POA: Diagnosis not present

## 2016-05-04 DIAGNOSIS — E11621 Type 2 diabetes mellitus with foot ulcer: Secondary | ICD-10-CM | POA: Diagnosis not present

## 2016-05-06 DIAGNOSIS — I129 Hypertensive chronic kidney disease with stage 1 through stage 4 chronic kidney disease, or unspecified chronic kidney disease: Secondary | ICD-10-CM | POA: Diagnosis not present

## 2016-05-06 DIAGNOSIS — G894 Chronic pain syndrome: Secondary | ICD-10-CM | POA: Diagnosis not present

## 2016-05-06 DIAGNOSIS — L97421 Non-pressure chronic ulcer of left heel and midfoot limited to breakdown of skin: Secondary | ICD-10-CM | POA: Diagnosis not present

## 2016-05-06 DIAGNOSIS — E1165 Type 2 diabetes mellitus with hyperglycemia: Secondary | ICD-10-CM | POA: Diagnosis not present

## 2016-05-06 DIAGNOSIS — E11621 Type 2 diabetes mellitus with foot ulcer: Secondary | ICD-10-CM | POA: Diagnosis not present

## 2016-05-06 DIAGNOSIS — I480 Paroxysmal atrial fibrillation: Secondary | ICD-10-CM | POA: Diagnosis not present

## 2016-05-06 DIAGNOSIS — N183 Chronic kidney disease, stage 3 (moderate): Secondary | ICD-10-CM | POA: Diagnosis not present

## 2016-05-06 DIAGNOSIS — E11311 Type 2 diabetes mellitus with unspecified diabetic retinopathy with macular edema: Secondary | ICD-10-CM | POA: Diagnosis not present

## 2016-05-06 DIAGNOSIS — Z7901 Long term (current) use of anticoagulants: Secondary | ICD-10-CM | POA: Diagnosis not present

## 2016-05-06 DIAGNOSIS — E1142 Type 2 diabetes mellitus with diabetic polyneuropathy: Secondary | ICD-10-CM | POA: Diagnosis not present

## 2016-05-06 DIAGNOSIS — E1122 Type 2 diabetes mellitus with diabetic chronic kidney disease: Secondary | ICD-10-CM | POA: Diagnosis not present

## 2016-05-07 ENCOUNTER — Other Ambulatory Visit: Payer: Self-pay | Admitting: Internal Medicine

## 2016-05-08 DIAGNOSIS — E1165 Type 2 diabetes mellitus with hyperglycemia: Secondary | ICD-10-CM | POA: Diagnosis not present

## 2016-05-08 DIAGNOSIS — E11621 Type 2 diabetes mellitus with foot ulcer: Secondary | ICD-10-CM | POA: Diagnosis not present

## 2016-05-08 DIAGNOSIS — E11311 Type 2 diabetes mellitus with unspecified diabetic retinopathy with macular edema: Secondary | ICD-10-CM | POA: Diagnosis not present

## 2016-05-08 DIAGNOSIS — I129 Hypertensive chronic kidney disease with stage 1 through stage 4 chronic kidney disease, or unspecified chronic kidney disease: Secondary | ICD-10-CM | POA: Diagnosis not present

## 2016-05-08 DIAGNOSIS — E1122 Type 2 diabetes mellitus with diabetic chronic kidney disease: Secondary | ICD-10-CM | POA: Diagnosis not present

## 2016-05-08 DIAGNOSIS — Z7901 Long term (current) use of anticoagulants: Secondary | ICD-10-CM | POA: Diagnosis not present

## 2016-05-08 DIAGNOSIS — G894 Chronic pain syndrome: Secondary | ICD-10-CM | POA: Diagnosis not present

## 2016-05-08 DIAGNOSIS — L97421 Non-pressure chronic ulcer of left heel and midfoot limited to breakdown of skin: Secondary | ICD-10-CM | POA: Diagnosis not present

## 2016-05-08 DIAGNOSIS — I480 Paroxysmal atrial fibrillation: Secondary | ICD-10-CM | POA: Diagnosis not present

## 2016-05-08 DIAGNOSIS — E1142 Type 2 diabetes mellitus with diabetic polyneuropathy: Secondary | ICD-10-CM | POA: Diagnosis not present

## 2016-05-08 DIAGNOSIS — N183 Chronic kidney disease, stage 3 (moderate): Secondary | ICD-10-CM | POA: Diagnosis not present

## 2016-05-08 LAB — TOXASSURE SELECT,+ANTIDEPR,UR

## 2016-05-11 ENCOUNTER — Other Ambulatory Visit: Payer: Self-pay

## 2016-05-11 ENCOUNTER — Other Ambulatory Visit: Payer: Self-pay | Admitting: Internal Medicine

## 2016-05-11 DIAGNOSIS — I129 Hypertensive chronic kidney disease with stage 1 through stage 4 chronic kidney disease, or unspecified chronic kidney disease: Secondary | ICD-10-CM | POA: Diagnosis not present

## 2016-05-11 DIAGNOSIS — Z79891 Long term (current) use of opiate analgesic: Secondary | ICD-10-CM | POA: Diagnosis not present

## 2016-05-11 DIAGNOSIS — Z794 Long term (current) use of insulin: Secondary | ICD-10-CM | POA: Diagnosis not present

## 2016-05-11 DIAGNOSIS — E11311 Type 2 diabetes mellitus with unspecified diabetic retinopathy with macular edema: Secondary | ICD-10-CM | POA: Diagnosis not present

## 2016-05-11 DIAGNOSIS — E1142 Type 2 diabetes mellitus with diabetic polyneuropathy: Secondary | ICD-10-CM | POA: Diagnosis not present

## 2016-05-11 DIAGNOSIS — N183 Chronic kidney disease, stage 3 (moderate): Secondary | ICD-10-CM | POA: Diagnosis not present

## 2016-05-11 DIAGNOSIS — Z7901 Long term (current) use of anticoagulants: Secondary | ICD-10-CM | POA: Diagnosis not present

## 2016-05-11 DIAGNOSIS — E11621 Type 2 diabetes mellitus with foot ulcer: Secondary | ICD-10-CM | POA: Diagnosis not present

## 2016-05-11 DIAGNOSIS — L97421 Non-pressure chronic ulcer of left heel and midfoot limited to breakdown of skin: Secondary | ICD-10-CM | POA: Diagnosis not present

## 2016-05-11 DIAGNOSIS — E1122 Type 2 diabetes mellitus with diabetic chronic kidney disease: Secondary | ICD-10-CM | POA: Diagnosis not present

## 2016-05-11 DIAGNOSIS — E1165 Type 2 diabetes mellitus with hyperglycemia: Secondary | ICD-10-CM | POA: Diagnosis not present

## 2016-05-11 NOTE — Telephone Encounter (Signed)
There are no changes. Continue Prisma. Thanks Dr. Cannon Kettle

## 2016-05-11 NOTE — Patient Outreach (Signed)
[facility]) Care Management  05/11/2016   [name] Sep 25, 1950 [id number]  Subjective:  The videos you showed really helped me to understand my diabetes.  Objective:  Telephonic encounter  Current Medications:  Current Outpatient Prescriptions  Medication Sig Dispense Refill  . ACCU-CHEK AVIVA PLUS test strip USE TO CHECK BLOOD SUGAR FOUR TIMES DAILY BEFORE MEALS AND AT BEDTIME 150 each 3  . ACCU-CHEK FASTCLIX LANCETS MISC Check blood sugar 4 times a day before meals and bedtime dx code 250.02 insulin requiring 102 each 6  . albuterol (PROAIR HFA) 108 (90 Base) MCG/ACT inhaler Inhale 2 puffs into the lungs every 6 (six) hours as needed for wheezing or shortness of breath. 18 g 3  . amoxicillin-clavulanate (AUGMENTIN) 875-125 MG tablet Take 1 tablet by mouth 2 (two) times daily. 28 tablet 0  . atorvastatin (LIPITOR) 20 MG tablet TAKE 1 TABLET(20 MG) BY MOUTH DAILY 90 tablet 3  . BESIVANCE 0.6 % SUSP Place 1 drop into both eyes 4 (four) times daily.   12  . Blood Glucose Monitoring Suppl (ACCU-CHEK AVIVA PLUS) W/DEVICE KIT 1 each by Does not apply route 4 (four) times daily -  with meals and at bedtime. 1 kit 0  . cadexomer iodine (IODOSORB) 0.9 % gel Apply 1 application topically daily as needed for wound care. 40 g 0  . capsaicin (ZOSTRIX) 0.025 % cream Apply 1 application topically daily as needed (for pain).    . Cholecalciferol (VITAMIN D3) 2000 UNITS capsule Take 1 capsule (2,000 Units total) by mouth daily. 90 capsule 0  . docusate sodium (COLACE) 100 MG capsule Take 1 capsule (100 mg total) by mouth 2 (two) times daily. (Patient not taking: Reported on 04/28/2016) 60 capsule 0  . ELIQUIS 5 MG TABS tablet TAKE 1 TABLET(5 MG) BY MOUTH TWICE DAILY 180 tablet 0  . enalapril (VASOTEC) 20 MG tablet TAKE 2 TABLETS(40 MG) BY MOUTH DAILY 90 tablet 3  . furosemide (LASIX) 40 MG tablet TAKE 1 TABLET(40 MG) BY MOUTH TWICE DAILY 60 tablet 0  .  HYDROcodone-acetaminophen (NORCO/VICODIN) 5-325 MG tablet Take 1 and 1/2 tablets by mouth every 6 hours as needed for pain 140 tablet 0  . Insulin Glargine (TOUJEO SOLOSTAR) 300 UNIT/ML SOPN Inject 60-90 Units into the skin 2 (two) times daily. (Patient taking differently: Inject 70-100 Units into the skin 2 (two) times daily. ) 45 pen 2  . Insulin Pen Needle 31G X 5 MM MISC Use for insulin injection 5 times a day. 150 each 5  . insulin regular human CONCENTRATED (HUMULIN R) 500 UNIT/ML kwikpen Inject 20 units at breakfast, 60 units at lunch and 70 units at dinner. 18 mL 3  . LUMIGAN 0.01 % SOLN Place 1 drop into both eyes at bedtime.     . metoprolol succinate (TOPROL-XL) 25 MG 24 hr tablet Take 1 tablet (25 mg total) by mouth daily. 90 tablet 3  . omeprazole (PRILOSEC) 20 MG capsule TAKE 1 CAPSULE(20 MG) BY MOUTH DAILY 90 capsule 0  . pregabalin (LYRICA) 25 MG capsule Take 1 capsule (25 mg total) by mouth 3 (three) times daily. 90 capsule 2  . promethazine (PHENERGAN) 25 MG tablet TAKE 1/2 TO 1 TABLET BY MOUTH EVERY 6 HOURS AS NEEDED FOR NAUSEA 30 tablet 0  . silver sulfADIAZINE (SILVADENE) 1 % cream Apply 1 application topically daily. 50 g 0  . SIMBRINZA 1-0.2 % SUSP Place 1 drop into both eyes 2 (two) times daily. Take as instructed  by Dr. Katy Fitch.    Marland Kitchen VICTOZA 18 MG/3ML SOPN INJECT 1.8 MG ONCE DAILY AT THE SAME TIME 9 mL 3   No current facility-administered medications for this visit.     Functional Status:  In your present state of health, do you have any difficulty performing the following activities: 04/15/2016 02/11/2016  Hearing? N N  Vision? Y N  Difficulty concentrating or making decisions? N N  Walking or climbing stairs? Y Y  Dressing or bathing? N N  Doing errands, shopping? Y N  Preparing Food and eating ? Y -  Using the Toilet? N -  In the past six months, have you accidently leaked urine? N -  Do you have problems with loss of bowel control? N -  Managing your Medications? N  -  Managing your Finances? N -  Housekeeping or managing your Housekeeping? Y -  Some recent data might be hidden    Fall/Depression Screening: PHQ 2/9 Scores 04/15/2016 03/30/2016 03/25/2016 02/11/2016 01/01/2016 01/01/2016 09/17/2015  PHQ - 2 Score 0 1 0 0 0 0 0  PHQ- 9 Score - - - - - - -    THN CM Care Plan Problem One   Flowsheet Row Most Recent Value  Care Plan Problem One  patient has multiple chronic illnesses  Role Documenting the Problem One  Care Management Coordinator  Care Plan for Problem One  Active  THN Long Term Goal (31-90 days)  In the next 31 days, patient will have made and attended an  appoitnemnt with his primary care [place] Term Goal Start Date  03/30/16  [facility] Long Term Goal Met Date  04/28/16  Interventions for Problem One Long Term Goal  telephone contact made  THN CM Short Term Goal #1 (0-30 days)  In the next 21 days, patient will have met with [facility] Coordinator for an assessment of his eduational needs  THN CM Short Term Goal #1 Start Date  03/30/16  [facility] CM Short Term Goal #1 Met Date  05/11/16  Interventions for Short Term Goal #1  telephone contact for assessment of any further diabetes education needs    [facility] CM Care Plan Problem Two   Flowsheet Row Most Recent Value  Care Plan Problem Two  Patinet has a hgA1C of over 8.0  Role Documenting the Problem Two  Care Management [place] for Problem Two  Active  THN CM Short Term Goal #1 (0-30 days)  In the next 28 days, patient will meet with [facility] for education on low carbohydrate diets  THN CM Short Term Goal #1 Start Date  04/14/16  [facility] CM Short Term Goal #1 Met Date   05/11/16  Interventions for Short Term Goal #2   telephone contact for assessment of further educational needs.      Assessment:  Patient is progressing towards meeting her case management goals.  Plan:   Telephone contact in the next 14 days.

## 2016-05-12 ENCOUNTER — Ambulatory Visit (INDEPENDENT_AMBULATORY_CARE_PROVIDER_SITE_OTHER): Payer: Medicare Other | Admitting: Sports Medicine

## 2016-05-12 ENCOUNTER — Encounter: Payer: Self-pay | Admitting: Sports Medicine

## 2016-05-12 ENCOUNTER — Telehealth: Payer: Self-pay | Admitting: *Deleted

## 2016-05-12 DIAGNOSIS — L97522 Non-pressure chronic ulcer of other part of left foot with fat layer exposed: Secondary | ICD-10-CM

## 2016-05-12 DIAGNOSIS — M79672 Pain in left foot: Secondary | ICD-10-CM

## 2016-05-12 DIAGNOSIS — IMO0002 Reserved for concepts with insufficient information to code with codable children: Secondary | ICD-10-CM

## 2016-05-12 DIAGNOSIS — Z89432 Acquired absence of left foot: Secondary | ICD-10-CM

## 2016-05-12 DIAGNOSIS — E1140 Type 2 diabetes mellitus with diabetic neuropathy, unspecified: Secondary | ICD-10-CM

## 2016-05-12 DIAGNOSIS — E11621 Type 2 diabetes mellitus with foot ulcer: Secondary | ICD-10-CM

## 2016-05-12 NOTE — Telephone Encounter (Signed)
Per dr Dareen Piano pt was called and scheduled for 11/2 when dr Dareen Piano is attending

## 2016-05-12 NOTE — Progress Notes (Signed)
Patient ID: MALAK ORANTES, male   DOB: 1951-05-30, 65 y.o.   MRN: [id number]  Subjective: CHRISTIEN FRANKL is a 65 y.o. male patient seen in office for follow up evaluation of ulceration of the left foot. Patient has a history of diabetes and a blood glucose level today not recorded.  Patient has nursing helping to change the dressing 2-3x/week applying PRISMA to site. Finished Augmentin with no issues. Denies nausea/fever/vomiting/chills/night sweats/shortness of breath/pain. Patient has no other pedal complaints at this time.  Patient Active Problem List   Diagnosis Date Noted  . Chronic pain syndrome 09/18/2015  . Chronic fatigue 04/16/2015  . Preventative health care 03/12/2015  . Family history of coronary artery disease in father 12/14/2014  . Sleep apnea-on C-pap 12/14/2014  . Acute osteomyelitis of toe of left foot ([place])   . Diabetic foot infection ([place]) 12/13/2014  . Foot ulcer, left ([place]) 12/13/2014  . Paroxysmal atrial fibrillation, new onset 12/13/2014  . Elevated alkaline phosphatase level 06/11/2014  . Constipation 05/31/2014  . Vitamin D deficiency 11/17/2013  . Frequent falls 06/30/2013  . Mild memory disturbance 07/27/2012  . Anemia 02/10/2012  . Hypertension 11/18/2011  . Obstructive sleep apnea 12/12/2009  . EDEMA 02/13/2009  . GERD 06/27/2008  . Diabetes type 2, uncontrolled ([place]) 12/28/2007  . DIABETIC MACULAR EDEMA 10/07/2007  . Dyslipidemia 08/15/2007  . BACKGROUND DIABETIC RETINOPATHY 08/15/2007  . ERECTILE DYSFUNCTION 05/31/2007  . HEARING LOSS, SENSORINEURAL, BILATERAL 01/03/2007  . Diabetic peripheral neuropathy associated with type 2 diabetes mellitus ([place]) 12/16/2006  . Chronic kidney disease, stage III (moderate) 12/01/2006  . Obesity BMI 46 08/23/2006  . STATUS, OTHER TOE(S) AMPUTATION 05/21/2006   Current Outpatient Prescriptions on File Prior to Visit  Medication Sig Dispense Refill  . ACCU-CHEK AVIVA PLUS test strip USE TO CHECK BLOOD  SUGAR FOUR TIMES DAILY BEFORE MEALS AND AT BEDTIME 150 each 3  . ACCU-CHEK FASTCLIX LANCETS MISC Check blood sugar 4 times a day before meals and bedtime dx code 250.02 insulin requiring 102 each 6  . albuterol (PROAIR HFA) 108 (90 Base) MCG/ACT inhaler Inhale 2 puffs into the lungs every 6 (six) hours as needed for wheezing or shortness of breath. 18 g 3  . amoxicillin-clavulanate (AUGMENTIN) 875-125 MG tablet Take 1 tablet by mouth 2 (two) times daily. 28 tablet 0  . atorvastatin (LIPITOR) 20 MG tablet TAKE 1 TABLET(20 MG) BY MOUTH DAILY 90 tablet 3  . BESIVANCE 0.6 % SUSP Place 1 drop into both eyes 4 (four) times daily.   12  . Blood Glucose Monitoring Suppl (ACCU-CHEK AVIVA PLUS) W/DEVICE KIT 1 each by Does not apply route 4 (four) times daily -  with meals and at bedtime. 1 kit 0  . cadexomer iodine (IODOSORB) 0.9 % gel Apply 1 application topically daily as needed for wound care. 40 g 0  . capsaicin (ZOSTRIX) 0.025 % cream Apply 1 application topically daily as needed (for pain).    . Cholecalciferol (VITAMIN D3) 2000 UNITS capsule Take 1 capsule (2,000 Units total) by mouth daily. 90 capsule 0  . docusate sodium (COLACE) 100 MG capsule Take 1 capsule (100 mg total) by mouth 2 (two) times daily. (Patient not taking: Reported on 04/28/2016) 60 capsule 0  . ELIQUIS 5 MG TABS tablet TAKE 1 TABLET(5 MG) BY MOUTH TWICE DAILY 180 tablet 0  . enalapril (VASOTEC) 20 MG tablet TAKE 2 TABLETS(40 MG) BY MOUTH DAILY 90 tablet 3  . furosemide (LASIX) 40 MG tablet TAKE 1 TABLET(40  MG) BY MOUTH TWICE DAILY 60 tablet 0  . HYDROcodone-acetaminophen (NORCO/VICODIN) 5-325 MG tablet Take 1 and 1/2 tablets by mouth every 6 hours as needed for pain 140 tablet 0  . Insulin Glargine (TOUJEO SOLOSTAR) 300 UNIT/ML SOPN Inject 60-90 Units into the skin 2 (two) times daily. (Patient taking differently: Inject 70-100 Units into the skin 2 (two) times daily. ) 45 pen 2  . Insulin Pen Needle 31G X 5 MM MISC Use for insulin  injection 5 times a day. 150 each 5  . insulin regular human CONCENTRATED (HUMULIN R) 500 UNIT/ML kwikpen Inject 20 units at breakfast, 60 units at lunch and 70 units at dinner. 18 mL 3  . LUMIGAN 0.01 % SOLN Place 1 drop into both eyes at bedtime.     . metoprolol succinate (TOPROL-XL) 25 MG 24 hr tablet Take 1 tablet (25 mg total) by mouth daily. 90 tablet 3  . omeprazole (PRILOSEC) 20 MG capsule TAKE 1 CAPSULE(20 MG) BY MOUTH DAILY 90 capsule 0  . pregabalin (LYRICA) 25 MG capsule Take 1 capsule (25 mg total) by mouth 3 (three) times daily. 90 capsule 2  . promethazine (PHENERGAN) 25 MG tablet TAKE 1/2 TO 1 TABLET BY MOUTH EVERY 6 HOURS AS NEEDED FOR NAUSEA 30 tablet 0  . silver sulfADIAZINE (SILVADENE) 1 % cream Apply 1 application topically daily. 50 g 0  . SIMBRINZA 1-0.2 % SUSP Place 1 drop into both eyes 2 (two) times daily. Take as instructed by Dr. Katy Fitch.    Marland Kitchen VICTOZA 18 MG/3ML SOPN INJECT 1.8 MG ONCE DAILY AT THE SAME TIME 9 mL 3   No current facility-administered medications on file prior to visit.    Allergies  Allergen Reactions  . Vancomycin     REACTION: ARF    Recent Results (from the past 2160 hour(s))  Hemoglobin A1c     Status: Abnormal   Collection Time: 04/13/16  9:57 AM  Result Value Ref Range   Hgb A1c MFr Bld 9.1 (H) 4.6 - 6.5 %    Comment: Glycemic Control Guidelines for People with Diabetes:Non Diabetic:  <6%Goal of Therapy: <7%Additional Action Suggested:  >8%   Comprehensive metabolic panel     Status: Abnormal   Collection Time: 04/13/16  9:57 AM  Result Value Ref Range   Sodium 137 135 - 145 mEq/L   Potassium 3.9 3.5 - 5.1 mEq/L   Chloride 105 96 - 112 mEq/L   CO2 27 19 - 32 mEq/L   Glucose, Bld 132 (H) 70 - 99 mg/dL   BUN 19 6 - 23 mg/dL   Creatinine, Ser 1.35 0.40 - 1.50 mg/dL   Total Bilirubin 0.4 0.2 - 1.2 mg/dL   Alkaline Phosphatase 126 (H) 39 - 117 U/L   AST 13 0 - 37 U/L   ALT 13 0 - 53 U/L   Total Protein 7.3 6.0 - 8.3 g/dL   Albumin 3.5  3.5 - 5.2 g/dL   Calcium 8.8 8.4 - 10.5 mg/dL   GFR 67.98 >60.00 mL/min  Microalbumin / creatinine urine ratio     Status: Abnormal   Collection Time: 04/13/16  9:57 AM  Result Value Ref Range   Microalb, Ur 14.9 (H) 0.0 - 1.9 mg/dL   Creatinine,U 89.7 mg/dL   Microalb Creat Ratio 16.6 0.0 - 30.0 mg/g  ToxAssure Select,+Antidepr,UR     Status: None   Collection Time: 05/01/16  3:26 PM  Result Value Ref Range   ToxASSURE Select (Plus) FINAL  Comment: ==================================================================== TOXASSURE SELECT,+ANTIDEPR,UR ==================================================================== Test                             Result       Flag       Units Drug Present not Declared for Prescription Verification   Benzoylecgonine                95           UNEXPECTED ng/mg creat    Benzoylecgonine is a metabolite of cocaine; its presence    indicates use of this drug.  Source is most commonly illicit, but    cocaine is present in some topical anesthetic solutions. Drug Absent but Declared for Prescription Verification   Hydrocodone                    Not Detected UNEXPECTED ng/mg creat    Not detected result may be consistent with the time of last use    noted for this medication. ==================================================================== Test                      Result    Flag   Units      Ref Range   Creatinine              154              mg/dL      >=20 ========================== ========================================== Declared Medications:  The flagging and interpretation on this report are based on the  following declared medications.  Unexpected results may arise from  inaccuracies in the declared medications.  **Note: The testing scope of this panel includes these medications:  Hydrocodone 04/25/16  **Note: The testing scope of this panel does not include following  reported medications:  Pregabalin (Lyrica)  05/01/16 ==================================================================== For clinical consultation, please call [phone]. ====================================================================    Family History  Problem Relation Age of Onset  . Diabetes Mother     also 2 siblings  . Heart attack Father 53  . Throat cancer Brother     Past Surgical History:  Procedure Laterality Date  . AMPUTATION Left 12/15/2014   Procedure: LEFT SECOND TOE AMPUTATION ;  Surgeon: Dorna Leitz, MD;  Location: [facility];  Service: Orthopedics;  Laterality: Left;  . 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 Kathalene Frames. Mayer Camel.    Objective: There were no vitals filed for this visit.  General: Patient is awake, alert, oriented x 3 and in no acute distress.  Dermatology: Skin is warm and dry bilateral with a now full thickness ulceration present Left plantar forefoot. Ulceration measures 2cm x 2.8cm x 0.3cm  (last measurement larger) extending into skin crease at amp site. There is a moderate keratotic border with a granular base. The ulceration does not probe to bone. There is minimal malodor, no active drainage, no erythema, decreasd swelling without warmth to dorsum of foot. No other acute signs of infection.   Vascular: Dorsalis Pedis pulse = 0/4 Bilateral,  Posterior Tibial pulse = 1/4 Bilateral,  Capillary Fill Time < 5 seconds at remaining toes   Neurologic: Protective sensation absent bilateral using SWMF.  Musculosketal: No Pain with palpation to ulcerated area on left. No pain with compression to calves bilateral. 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  Assessment and Plan:  Problem List Items Addressed  This Visit    None    Visit Diagnoses    Diabetic ulcer of left foot associated with type 2 diabetes mellitus, with fat layer exposed, unspecified part of foot ([place])    -  Primary   Foot amputation status,  left ([place])       Type 2 diabetes mellitus with diabetic neuropathy, unspecified long term insulin use status ([facility])       Foot pain, left         -Examined patient and discussed the progression of the wound and treatment alternatives. - Excisionally dedbrided ulceration to healthy bleeding borders using a sterile chisel blade.  -Applied Prisma Ag and dry sterile dressing and instructed patient continue with home nursing 3x per week consisting of PRISMA and dry sterile dressing. -Awaiting surgery for debridement and stravix at [place] with forefoot offloading post op shoe to use daily to left foot and after surgery -Advised patient to refrain from getting the ulceration wet and to limit activity to necessity - Augmentin completed  -Advised patient to go to the ER or return to office if the wound worsens or if constitutional symptoms are present. -Patient to return to office 2-3 weeks for follow up ulceration care or after surgery if schedule before next ulcer check or sooner if problems arise.  Landis Martins, DPM

## 2016-05-13 DIAGNOSIS — E1142 Type 2 diabetes mellitus with diabetic polyneuropathy: Secondary | ICD-10-CM | POA: Diagnosis not present

## 2016-05-13 DIAGNOSIS — E11621 Type 2 diabetes mellitus with foot ulcer: Secondary | ICD-10-CM | POA: Diagnosis not present

## 2016-05-13 DIAGNOSIS — E1122 Type 2 diabetes mellitus with diabetic chronic kidney disease: Secondary | ICD-10-CM | POA: Diagnosis not present

## 2016-05-13 DIAGNOSIS — Z79891 Long term (current) use of opiate analgesic: Secondary | ICD-10-CM | POA: Diagnosis not present

## 2016-05-13 DIAGNOSIS — I129 Hypertensive chronic kidney disease with stage 1 through stage 4 chronic kidney disease, or unspecified chronic kidney disease: Secondary | ICD-10-CM | POA: Diagnosis not present

## 2016-05-13 DIAGNOSIS — Z794 Long term (current) use of insulin: Secondary | ICD-10-CM | POA: Diagnosis not present

## 2016-05-13 DIAGNOSIS — Z7901 Long term (current) use of anticoagulants: Secondary | ICD-10-CM | POA: Diagnosis not present

## 2016-05-13 DIAGNOSIS — N183 Chronic kidney disease, stage 3 (moderate): Secondary | ICD-10-CM | POA: Diagnosis not present

## 2016-05-13 DIAGNOSIS — L97421 Non-pressure chronic ulcer of left heel and midfoot limited to breakdown of skin: Secondary | ICD-10-CM | POA: Diagnosis not present

## 2016-05-13 DIAGNOSIS — E11311 Type 2 diabetes mellitus with unspecified diabetic retinopathy with macular edema: Secondary | ICD-10-CM | POA: Diagnosis not present

## 2016-05-13 DIAGNOSIS — E1165 Type 2 diabetes mellitus with hyperglycemia: Secondary | ICD-10-CM | POA: Diagnosis not present

## 2016-05-14 ENCOUNTER — Ambulatory Visit (INDEPENDENT_AMBULATORY_CARE_PROVIDER_SITE_OTHER): Payer: Medicare Other | Admitting: Internal Medicine

## 2016-05-14 VITALS — BP 123/72 | HR 97 | Temp 98.5°F | Ht 78.0 in | Wt 374.4 lb

## 2016-05-14 DIAGNOSIS — G4733 Obstructive sleep apnea (adult) (pediatric): Secondary | ICD-10-CM

## 2016-05-14 DIAGNOSIS — Z89422 Acquired absence of other left toe(s): Secondary | ICD-10-CM

## 2016-05-14 DIAGNOSIS — Z79891 Long term (current) use of opiate analgesic: Secondary | ICD-10-CM

## 2016-05-14 DIAGNOSIS — E11621 Type 2 diabetes mellitus with foot ulcer: Secondary | ICD-10-CM

## 2016-05-14 DIAGNOSIS — L97529 Non-pressure chronic ulcer of other part of left foot with unspecified severity: Secondary | ICD-10-CM

## 2016-05-14 DIAGNOSIS — I10 Essential (primary) hypertension: Secondary | ICD-10-CM | POA: Diagnosis not present

## 2016-05-14 DIAGNOSIS — Z79899 Other long term (current) drug therapy: Secondary | ICD-10-CM

## 2016-05-14 DIAGNOSIS — Z87891 Personal history of nicotine dependence: Secondary | ICD-10-CM

## 2016-05-14 DIAGNOSIS — G894 Chronic pain syndrome: Secondary | ICD-10-CM | POA: Diagnosis not present

## 2016-05-14 LAB — GLUCOSE, CAPILLARY: GLUCOSE-CAPILLARY: 186 mg/dL — AB (ref 65–99)

## 2016-05-14 NOTE — Assessment & Plan Note (Addendum)
Assessment His blood pressure today is 123/72 though there appeared to be a 15-20 point discrepancy in his systolic blood pressure as compared to his home blood pressure machine with which we measured today.  Plan -Advised the patient to be concerned if his systolics blood pressure were above 160-170 -Continue enalapril 20 mg daily, Lasix 40 mg twice, continue metoprolol succinate 25 mg daily

## 2016-05-14 NOTE — Assessment & Plan Note (Signed)
Assessment He never received his CPAP machine and is inquiring as to what happened. I spoke with our staff, and it appears CPAP machine supplies were ordered instead of the machine.  Plan -Reorder CPAP machine using settings from prior sleep study

## 2016-05-14 NOTE — Assessment & Plan Note (Signed)
Assessment Most recent UDS collected 2 weeks ago was in appropriate for the absence of Vicodin and for the presence of cocaine metabolites. In reviewing the abnormal results with him, he denies taking any medications or illicit substances though acknowledges having gone to a party where others may have been using her consuming illicit substances. He denies marijuana use and denied cocaine use when presented with the results of his UDS. He has never had a history of inappropriate urine tests.  Plan -Counseled the patient on being cautious about accepting food or beverages from unknown individuals as well as to be careful while attending social gatherings, especially when there is a high suspicion that others may be consuming illicit substances -Reminded the patient that he signed a controlled substance policy which would mandate tapering of his chronic opiate therapy should he continue to have abnormal results -Speak with his podiatrist to make sure that there were no agents which may have resulted in a false positive in his urine drug screen -Recheck UDS today

## 2016-05-14 NOTE — Patient Instructions (Signed)
For your pain medication, be extra careful around other people who may be taking things they should not.   For your CPAP, we will check with the front office.  For your surgery, we will send over your paperwork.  Let's see each other back in 3 months.

## 2016-05-14 NOTE — Progress Notes (Signed)
   CC: Abnormal UDS  HPI:  [name] is a 65 y.o. male who presents today for abnormal UDS. Please see assessment & plan for status of chronic medical problems.   Past Medical History:  Diagnosis Date  . Arthritis    "elbows & knees" (12/13/2014)  . Asthma   . CKD (chronic kidney disease), stage III   . DIABETIC FOOT ULCER 06/20/2009  . Edema, macular, due to secondary diabetes ([place])   . Erectile dysfunction   . GERD (gastroesophageal reflux disease)   . HEARING LOSS, SENSORINEURAL, BILATERAL 01/03/2007   Seen by ENT Dr. Orpah Greek D. [place] Baseman 01/03/07  . Hemorrhoids   . History of echocardiogram    a. 04/2008 Echo: EF 50-55%, abnl LV relaxation, mildly dil LA.  Marland Kitchen Hyperlipidemia   . Hypertension   . Morbid obesity ([place])   . Neuropathy, lower extremity   . OSA on CPAP    Nocturnal polysomnogram on 01/21/2010 showed severe obstructive sleep apnea/hypopnea syndrome, AHI 74.1 per hour with non positional events, moderately loud snoring, and oxygen desaturation to a nadir of 78% on room air.  CPAP was successfully titrated to 17 CWP, AHI 1.1 per hour using a large ResMed Mirage Quattro full-face mask with heated humidifier. Bruxism was noted.   . Osteomyelitis of ankle and foot ([place])   . Retinopathy   . Type II diabetes mellitus ([facility])    w/complication NOS, type II    Review of Systems:  Please see each problem below for a pertinent review of systems.  Physical Exam:  Vitals:   05/14/16 1353  BP: 123/72  Pulse: 97  Temp: 98.5 F (36.9 C)  TempSrc: Oral  SpO2: 100%  Weight: (!) 374 lb 6.4 oz (169.8 kg)  Height: 6\' 6"  (1.981 m)   Physical Exam  Constitutional: He is oriented to person, place, and time. No distress.  HENT:  Head: Normocephalic and atraumatic.  Eyes: Conjunctivae are normal. No scleral icterus.  Neck: Normal range of motion. No tracheal deviation present.  Cardiovascular: Normal rate and regular rhythm.   Pulmonary/Chest: Effort normal. No respiratory  distress.  Abdominal: Soft. He exhibits no distension. There is no tenderness. There is no rebound and no guarding.  Musculoskeletal:  Left foot covered in bandage,  Neurological: He is alert and oriented to person, place, and time.  Skin: Skin is warm and dry. He is not diaphoretic.     Assessment & Plan:   See Encounters Tab for problem based charting.  Patient seen with Dr. Dareen Piano

## 2016-05-14 NOTE — Progress Notes (Addendum)
Internal Medicine Clinic Attending  I saw and evaluated the patient.  I personally confirmed the key portions of the history and exam documented by Dr. Charlott Rakes and I reviewed pertinent patient test results.  The assessment, diagnosis, and plan were formulated together and I agree with the documentation in the resident's note.  Patient has OSA on CPAP and is benefiting and compliant with his C-PAP Therapy.

## 2016-05-14 NOTE — Assessment & Plan Note (Signed)
Assessment He is to undergo wound debridement followed by allograft of his left foot ulcer and needs preoperative clearance.   Per the Revised Cardiac Risk Index, his only risk factor is risk factors include insulin-dependent type 2 diabetes which amounts to 1% rate of cardiac death, nonfatal MI, nonfatal cardiac arrest and 1.3% rate of MI, pulmonary edema, ventricular fibrillation, primary cardiac arrest and complete heart block.   Medications were reconciled to provide to the [facility].  Plan -Complete paperwork and fax over

## 2016-05-15 DIAGNOSIS — Z79891 Long term (current) use of opiate analgesic: Secondary | ICD-10-CM | POA: Diagnosis not present

## 2016-05-15 DIAGNOSIS — I129 Hypertensive chronic kidney disease with stage 1 through stage 4 chronic kidney disease, or unspecified chronic kidney disease: Secondary | ICD-10-CM | POA: Diagnosis not present

## 2016-05-15 DIAGNOSIS — Z7901 Long term (current) use of anticoagulants: Secondary | ICD-10-CM | POA: Diagnosis not present

## 2016-05-15 DIAGNOSIS — E11621 Type 2 diabetes mellitus with foot ulcer: Secondary | ICD-10-CM | POA: Diagnosis not present

## 2016-05-15 DIAGNOSIS — E1165 Type 2 diabetes mellitus with hyperglycemia: Secondary | ICD-10-CM | POA: Diagnosis not present

## 2016-05-15 DIAGNOSIS — Z794 Long term (current) use of insulin: Secondary | ICD-10-CM | POA: Diagnosis not present

## 2016-05-15 DIAGNOSIS — N183 Chronic kidney disease, stage 3 (moderate): Secondary | ICD-10-CM | POA: Diagnosis not present

## 2016-05-15 DIAGNOSIS — E11311 Type 2 diabetes mellitus with unspecified diabetic retinopathy with macular edema: Secondary | ICD-10-CM | POA: Diagnosis not present

## 2016-05-15 DIAGNOSIS — E1122 Type 2 diabetes mellitus with diabetic chronic kidney disease: Secondary | ICD-10-CM | POA: Diagnosis not present

## 2016-05-15 DIAGNOSIS — L97421 Non-pressure chronic ulcer of left heel and midfoot limited to breakdown of skin: Secondary | ICD-10-CM | POA: Diagnosis not present

## 2016-05-15 DIAGNOSIS — E1142 Type 2 diabetes mellitus with diabetic polyneuropathy: Secondary | ICD-10-CM | POA: Diagnosis not present

## 2016-05-18 ENCOUNTER — Telehealth: Payer: Self-pay | Admitting: Pharmacist

## 2016-05-18 DIAGNOSIS — N183 Chronic kidney disease, stage 3 (moderate): Secondary | ICD-10-CM | POA: Diagnosis not present

## 2016-05-18 DIAGNOSIS — Z7901 Long term (current) use of anticoagulants: Secondary | ICD-10-CM | POA: Diagnosis not present

## 2016-05-18 DIAGNOSIS — I129 Hypertensive chronic kidney disease with stage 1 through stage 4 chronic kidney disease, or unspecified chronic kidney disease: Secondary | ICD-10-CM | POA: Diagnosis not present

## 2016-05-18 DIAGNOSIS — E1142 Type 2 diabetes mellitus with diabetic polyneuropathy: Secondary | ICD-10-CM | POA: Diagnosis not present

## 2016-05-18 DIAGNOSIS — L97421 Non-pressure chronic ulcer of left heel and midfoot limited to breakdown of skin: Secondary | ICD-10-CM | POA: Diagnosis not present

## 2016-05-18 DIAGNOSIS — E11311 Type 2 diabetes mellitus with unspecified diabetic retinopathy with macular edema: Secondary | ICD-10-CM | POA: Diagnosis not present

## 2016-05-18 DIAGNOSIS — Z794 Long term (current) use of insulin: Secondary | ICD-10-CM | POA: Diagnosis not present

## 2016-05-18 DIAGNOSIS — E11621 Type 2 diabetes mellitus with foot ulcer: Secondary | ICD-10-CM | POA: Diagnosis not present

## 2016-05-18 DIAGNOSIS — E1122 Type 2 diabetes mellitus with diabetic chronic kidney disease: Secondary | ICD-10-CM | POA: Diagnosis not present

## 2016-05-18 DIAGNOSIS — Z79891 Long term (current) use of opiate analgesic: Secondary | ICD-10-CM | POA: Diagnosis not present

## 2016-05-18 DIAGNOSIS — E1165 Type 2 diabetes mellitus with hyperglycemia: Secondary | ICD-10-CM | POA: Diagnosis not present

## 2016-05-18 NOTE — Progress Notes (Signed)
Contacted patient for notification of THN study regarding medication adherence. Patient verbalized understanding. 

## 2016-05-19 ENCOUNTER — Encounter: Payer: Self-pay | Admitting: Internal Medicine

## 2016-05-19 ENCOUNTER — Encounter: Payer: Medicare Other | Admitting: Internal Medicine

## 2016-05-20 DIAGNOSIS — E1122 Type 2 diabetes mellitus with diabetic chronic kidney disease: Secondary | ICD-10-CM | POA: Diagnosis not present

## 2016-05-20 DIAGNOSIS — L97421 Non-pressure chronic ulcer of left heel and midfoot limited to breakdown of skin: Secondary | ICD-10-CM | POA: Diagnosis not present

## 2016-05-20 DIAGNOSIS — N183 Chronic kidney disease, stage 3 (moderate): Secondary | ICD-10-CM | POA: Diagnosis not present

## 2016-05-20 DIAGNOSIS — E1142 Type 2 diabetes mellitus with diabetic polyneuropathy: Secondary | ICD-10-CM | POA: Diagnosis not present

## 2016-05-20 DIAGNOSIS — E1165 Type 2 diabetes mellitus with hyperglycemia: Secondary | ICD-10-CM | POA: Diagnosis not present

## 2016-05-20 DIAGNOSIS — E11621 Type 2 diabetes mellitus with foot ulcer: Secondary | ICD-10-CM | POA: Diagnosis not present

## 2016-05-20 DIAGNOSIS — Z79891 Long term (current) use of opiate analgesic: Secondary | ICD-10-CM | POA: Diagnosis not present

## 2016-05-20 DIAGNOSIS — I129 Hypertensive chronic kidney disease with stage 1 through stage 4 chronic kidney disease, or unspecified chronic kidney disease: Secondary | ICD-10-CM | POA: Diagnosis not present

## 2016-05-20 DIAGNOSIS — E11311 Type 2 diabetes mellitus with unspecified diabetic retinopathy with macular edema: Secondary | ICD-10-CM | POA: Diagnosis not present

## 2016-05-20 DIAGNOSIS — Z794 Long term (current) use of insulin: Secondary | ICD-10-CM | POA: Diagnosis not present

## 2016-05-20 DIAGNOSIS — Z7901 Long term (current) use of anticoagulants: Secondary | ICD-10-CM | POA: Diagnosis not present

## 2016-05-23 DIAGNOSIS — E1142 Type 2 diabetes mellitus with diabetic polyneuropathy: Secondary | ICD-10-CM | POA: Diagnosis not present

## 2016-05-23 DIAGNOSIS — Z79891 Long term (current) use of opiate analgesic: Secondary | ICD-10-CM | POA: Diagnosis not present

## 2016-05-23 DIAGNOSIS — E1165 Type 2 diabetes mellitus with hyperglycemia: Secondary | ICD-10-CM | POA: Diagnosis not present

## 2016-05-23 DIAGNOSIS — E11311 Type 2 diabetes mellitus with unspecified diabetic retinopathy with macular edema: Secondary | ICD-10-CM | POA: Diagnosis not present

## 2016-05-23 DIAGNOSIS — N183 Chronic kidney disease, stage 3 (moderate): Secondary | ICD-10-CM | POA: Diagnosis not present

## 2016-05-23 DIAGNOSIS — E11621 Type 2 diabetes mellitus with foot ulcer: Secondary | ICD-10-CM | POA: Diagnosis not present

## 2016-05-23 DIAGNOSIS — Z794 Long term (current) use of insulin: Secondary | ICD-10-CM | POA: Diagnosis not present

## 2016-05-23 DIAGNOSIS — Z7901 Long term (current) use of anticoagulants: Secondary | ICD-10-CM | POA: Diagnosis not present

## 2016-05-23 DIAGNOSIS — I129 Hypertensive chronic kidney disease with stage 1 through stage 4 chronic kidney disease, or unspecified chronic kidney disease: Secondary | ICD-10-CM | POA: Diagnosis not present

## 2016-05-23 DIAGNOSIS — L97421 Non-pressure chronic ulcer of left heel and midfoot limited to breakdown of skin: Secondary | ICD-10-CM | POA: Diagnosis not present

## 2016-05-23 DIAGNOSIS — E1122 Type 2 diabetes mellitus with diabetic chronic kidney disease: Secondary | ICD-10-CM | POA: Diagnosis not present

## 2016-05-25 DIAGNOSIS — E11311 Type 2 diabetes mellitus with unspecified diabetic retinopathy with macular edema: Secondary | ICD-10-CM | POA: Diagnosis not present

## 2016-05-25 DIAGNOSIS — N183 Chronic kidney disease, stage 3 (moderate): Secondary | ICD-10-CM | POA: Diagnosis not present

## 2016-05-25 DIAGNOSIS — Z794 Long term (current) use of insulin: Secondary | ICD-10-CM | POA: Diagnosis not present

## 2016-05-25 DIAGNOSIS — L97421 Non-pressure chronic ulcer of left heel and midfoot limited to breakdown of skin: Secondary | ICD-10-CM | POA: Diagnosis not present

## 2016-05-25 DIAGNOSIS — Z7901 Long term (current) use of anticoagulants: Secondary | ICD-10-CM | POA: Diagnosis not present

## 2016-05-25 DIAGNOSIS — E1122 Type 2 diabetes mellitus with diabetic chronic kidney disease: Secondary | ICD-10-CM | POA: Diagnosis not present

## 2016-05-25 DIAGNOSIS — E1142 Type 2 diabetes mellitus with diabetic polyneuropathy: Secondary | ICD-10-CM | POA: Diagnosis not present

## 2016-05-25 DIAGNOSIS — Z79891 Long term (current) use of opiate analgesic: Secondary | ICD-10-CM | POA: Diagnosis not present

## 2016-05-25 DIAGNOSIS — E11621 Type 2 diabetes mellitus with foot ulcer: Secondary | ICD-10-CM | POA: Diagnosis not present

## 2016-05-25 DIAGNOSIS — E1165 Type 2 diabetes mellitus with hyperglycemia: Secondary | ICD-10-CM | POA: Diagnosis not present

## 2016-05-25 DIAGNOSIS — I129 Hypertensive chronic kidney disease with stage 1 through stage 4 chronic kidney disease, or unspecified chronic kidney disease: Secondary | ICD-10-CM | POA: Diagnosis not present

## 2016-05-26 ENCOUNTER — Ambulatory Visit (INDEPENDENT_AMBULATORY_CARE_PROVIDER_SITE_OTHER): Payer: Medicare Other | Admitting: Sports Medicine

## 2016-05-26 ENCOUNTER — Encounter: Payer: Self-pay | Admitting: Sports Medicine

## 2016-05-26 DIAGNOSIS — E1140 Type 2 diabetes mellitus with diabetic neuropathy, unspecified: Secondary | ICD-10-CM

## 2016-05-26 DIAGNOSIS — E11621 Type 2 diabetes mellitus with foot ulcer: Secondary | ICD-10-CM

## 2016-05-26 DIAGNOSIS — L97522 Non-pressure chronic ulcer of other part of left foot with fat layer exposed: Secondary | ICD-10-CM | POA: Diagnosis not present

## 2016-05-26 DIAGNOSIS — M79672 Pain in left foot: Secondary | ICD-10-CM

## 2016-05-26 DIAGNOSIS — G4733 Obstructive sleep apnea (adult) (pediatric): Secondary | ICD-10-CM | POA: Diagnosis not present

## 2016-05-26 DIAGNOSIS — Z89432 Acquired absence of left foot: Secondary | ICD-10-CM

## 2016-05-26 DIAGNOSIS — IMO0002 Reserved for concepts with insufficient information to code with codable children: Secondary | ICD-10-CM

## 2016-05-26 NOTE — Progress Notes (Signed)
Patient ID: [name], male   DOB: November 04, 1950, 65 y.o.   MRN: [id number]  Subjective: [name] is a 65 y.o. male patient seen in office for follow up evaluation of ulceration of the left foot. Patient has a history of diabetes and a blood glucose level today not recorded.  Patient has nursing helping to change the dressing 2-3x/week applying PRISMA to site. Awaiting surgery states that he gave PCP H&P form already. Denies nausea/fever/vomiting/chills/night sweats/shortness of breath/pain. Patient has no other pedal complaints at this time.  Patient Active Problem List   Diagnosis Date Noted  . Chronic pain syndrome 09/18/2015  . Chronic fatigue 04/16/2015  . Preventative health care 03/12/2015  . Family history of coronary artery disease in father 12/14/2014  . Acute osteomyelitis of toe of left foot ([place])   . Foot ulcer, left ([place]) 12/13/2014  . Paroxysmal atrial fibrillation, new onset 12/13/2014  . Elevated alkaline phosphatase level 06/11/2014  . Constipation 05/31/2014  . Vitamin D deficiency 11/17/2013  . Frequent falls 06/30/2013  . Mild memory disturbance 07/27/2012  . Anemia 02/10/2012  . Hypertension 11/18/2011  . Obstructive sleep apnea 12/12/2009  . EDEMA 02/13/2009  . GERD 06/27/2008  . Diabetes type 2, uncontrolled ([place]) 12/28/2007  . DIABETIC MACULAR EDEMA 10/07/2007  . Dyslipidemia 08/15/2007  . BACKGROUND DIABETIC RETINOPATHY 08/15/2007  . ERECTILE DYSFUNCTION 05/31/2007  . HEARING LOSS, SENSORINEURAL, BILATERAL 01/03/2007  . Diabetic peripheral neuropathy associated with type 2 diabetes mellitus ([place]) 12/16/2006  . Chronic kidney disease, stage III (moderate) 12/01/2006  . Obesity BMI 46 08/23/2006  . STATUS, OTHER TOE(S) AMPUTATION 05/21/2006   Current Outpatient Prescriptions on File Prior to Visit  Medication Sig Dispense Refill  . ACCU-CHEK AVIVA PLUS test strip USE TO CHECK BLOOD SUGAR FOUR TIMES DAILY BEFORE MEALS AND AT BEDTIME 150  each 3  . ACCU-CHEK FASTCLIX LANCETS MISC Check blood sugar 4 times a day before meals and bedtime dx code 250.02 insulin requiring 102 each 6  . albuterol (PROAIR HFA) 108 (90 Base) MCG/ACT inhaler Inhale 2 puffs into the lungs every 6 (six) hours as needed for wheezing or shortness of breath. 18 g 3  . atorvastatin (LIPITOR) 20 MG tablet TAKE 1 TABLET(20 MG) BY MOUTH DAILY 90 tablet 3  . BESIVANCE 0.6 % SUSP Place 1 drop into both eyes 4 (four) times daily.   12  . Blood Glucose Monitoring Suppl (ACCU-CHEK AVIVA PLUS) W/DEVICE KIT 1 each by Does not apply route 4 (four) times daily -  with meals and at bedtime. 1 kit 0  . cadexomer iodine (IODOSORB) 0.9 % gel Apply 1 application topically daily as needed for wound care. (Patient not taking: Reported on 05/14/2016) 40 g 0  . capsaicin (ZOSTRIX) 0.025 % cream Apply 1 application topically daily as needed (for pain).    . Cholecalciferol (VITAMIN D3) 2000 UNITS capsule Take 1 capsule (2,000 Units total) by mouth daily. 90 capsule 0  . ELIQUIS 5 MG TABS tablet TAKE 1 TABLET(5 MG) BY MOUTH TWICE DAILY 180 tablet 0  . enalapril (VASOTEC) 20 MG tablet TAKE 2 TABLETS(40 MG) BY MOUTH DAILY 90 tablet 3  . furosemide (LASIX) 40 MG tablet TAKE 1 TABLET(40 MG) BY MOUTH TWICE DAILY 60 tablet 0  . HYDROcodone-acetaminophen (NORCO/VICODIN) 5-325 MG tablet Take 1 and 1/2 tablets by mouth every 6 hours as needed for pain 140 tablet 0  . Insulin Glargine (TOUJEO SOLOSTAR) 300 UNIT/ML SOPN Inject 60-90 Units into the skin 2 (two) times  daily. (Patient taking differently: Inject 70-100 Units into the skin 2 (two) times daily. ) 45 pen 2  . Insulin Pen Needle 31G X 5 MM MISC Use for insulin injection 5 times a day. 150 each 5  . insulin regular human CONCENTRATED (HUMULIN R) 500 UNIT/ML kwikpen Inject 20 units at breakfast, 60 units at lunch and 70 units at dinner. 18 mL 3  . LUMIGAN 0.01 % SOLN Place 1 drop into both eyes at bedtime.     . metoprolol succinate  (TOPROL-XL) 25 MG 24 hr tablet Take 1 tablet (25 mg total) by mouth daily. 90 tablet 3  . omeprazole (PRILOSEC) 20 MG capsule TAKE 1 CAPSULE(20 MG) BY MOUTH DAILY 90 capsule 0  . pregabalin (LYRICA) 25 MG capsule Take 1 capsule (25 mg total) by mouth 3 (three) times daily. 90 capsule 2  . promethazine (PHENERGAN) 25 MG tablet TAKE 1/2 TO 1 TABLET BY MOUTH EVERY 6 HOURS AS NEEDED FOR NAUSEA 30 tablet 0  . silver sulfADIAZINE (SILVADENE) 1 % cream Apply 1 application topically daily. (Patient not taking: Reported on 05/14/2016) 50 g 0  . SIMBRINZA 1-0.2 % SUSP Place 1 drop into both eyes 2 (two) times daily. Take as instructed by Dr. Katy Fitch.    Marland Kitchen VICTOZA 18 MG/3ML SOPN INJECT 1.8 MG ONCE DAILY AT THE SAME TIME 9 mL 3   No current facility-administered medications on file prior to visit.    Allergies  Allergen Reactions  . Vancomycin     REACTION: ARF    Recent Results (from the past 2160 hour(s))  Hemoglobin A1c     Status: Abnormal   Collection Time: 04/13/16  9:57 AM  Result Value Ref Range   Hgb A1c MFr Bld 9.1 (H) 4.6 - 6.5 %    Comment: Glycemic Control Guidelines for People with Diabetes:Non Diabetic:  <6%Goal of Therapy: <7%Additional Action Suggested:  >8%   Comprehensive metabolic panel     Status: Abnormal   Collection Time: 04/13/16  9:57 AM  Result Value Ref Range   Sodium 137 135 - 145 mEq/L   Potassium 3.9 3.5 - 5.1 mEq/L   Chloride 105 96 - 112 mEq/L   CO2 27 19 - 32 mEq/L   Glucose, Bld 132 (H) 70 - 99 mg/dL   BUN 19 6 - 23 mg/dL   Creatinine, Ser 1.35 0.40 - 1.50 mg/dL   Total Bilirubin 0.4 0.2 - 1.2 mg/dL   Alkaline Phosphatase 126 (H) 39 - 117 U/L   AST 13 0 - 37 U/L   ALT 13 0 - 53 U/L   Total Protein 7.3 6.0 - 8.3 g/dL   Albumin 3.5 3.5 - 5.2 g/dL   Calcium 8.8 8.4 - 10.5 mg/dL   GFR 67.98 >60.00 mL/min  Microalbumin / creatinine urine ratio     Status: Abnormal   Collection Time: 04/13/16  9:57 AM  Result Value Ref Range   Microalb, Ur 14.9 (H) 0.0 - 1.9  mg/dL   Creatinine,U 89.7 mg/dL   Microalb Creat Ratio 16.6 0.0 - 30.0 mg/g  HM DIABETES EYE EXAM     Status: None   Collection Time: 04/30/16 12:00 AM  Result Value Ref Range   HM Diabetic Eye Exam  No Retinopathy  ToxAssure Select,+Antidepr,UR     Status: None   Collection Time: 05/01/16  3:26 PM  Result Value Ref Range   ToxASSURE Select (Plus) FINAL     Comment: ==================================================================== TOXASSURE SELECT,+ANTIDEPR,UR ==================================================================== Test  Result       Flag       Units Drug Present not Declared for Prescription Verification   Benzoylecgonine                95           UNEXPECTED ng/mg creat    Benzoylecgonine is a metabolite of cocaine; its presence    indicates use of this drug.  Source is most commonly illicit, but    cocaine is present in some topical anesthetic solutions. Drug Absent but Declared for Prescription Verification   Hydrocodone                    Not Detected UNEXPECTED ng/mg creat    Not detected result may be consistent with the time of last use    noted for this medication. ==================================================================== Test                      Result    Flag   Units      Ref Range   Creatinine              154              mg/dL      >=20 ========================== ========================================== Declared Medications:  The flagging and interpretation on this report are based on the  following declared medications.  Unexpected results may arise from  inaccuracies in the declared medications.  **Note: The testing scope of this panel includes these medications:  Hydrocodone 04/25/16  **Note: The testing scope of this panel does not include following  reported medications:  Pregabalin (Lyrica) 05/01/16 ==================================================================== For clinical consultation, please  call [phone]. ====================================================================   Glucose, capillary     Status: Abnormal   Collection Time: 05/14/16  2:12 PM  Result Value Ref Range   Glucose-Capillary 186 (H) 65 - 99 mg/dL   Family History  Problem Relation Age of Onset  . Diabetes Mother     also 2 siblings  . Heart attack Father 63  . Throat cancer Brother     Past Surgical History:  Procedure Laterality Date  . AMPUTATION Left 12/15/2014   Procedure: LEFT SECOND TOE AMPUTATION ;  Surgeon: Dorna Leitz, MD;  Location: [facility];  Service: Orthopedics;  Laterality: Left;  . 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 Kathalene Frames. Mayer Camel.    Objective: There were no vitals filed for this visit.  General: Patient is awake, alert, oriented x 3 and in no acute distress.  Dermatology: Skin is warm and dry bilateral with a now full thickness ulceration present Left plantar forefoot. Ulceration measures 2cm x 2.5cm x 0.3cm  (last measurement larger) extending into skin crease at amp site. There is a moderate keratotic border with a granular base. The ulceration does not probe to bone. There is minimal malodor, no active drainage, no erythema, decreasd swelling without warmth to dorsum of foot. No other acute signs of infection.   Vascular: Dorsalis Pedis pulse = 0/4 Bilateral,  Posterior Tibial pulse = 1/4 Bilateral,  Capillary Fill Time < 5 seconds at remaining toes   Neurologic: Protective sensation absent bilateral using SWMF.  Musculosketal: No Pain with palpation to ulcerated area on left. No pain with compression to calves bilateral. 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  Assessment and Plan:  Problem List Items Addressed This Visit  None    Visit Diagnoses    Diabetic ulcer of left foot associated with type 2 diabetes mellitus, with fat layer exposed, unspecified part of foot ([place])    -   Primary   Foot amputation status, left ([place])       Type 2 diabetes mellitus with diabetic neuropathy, unspecified long term insulin use status ([facility])       Foot pain, left         -Examined patient and discussed the progression of the wound and treatment alternatives. - Excisionally dedbrided ulceration to healthy bleeding borders using a sterile chisel blade.  -Applied Prisma Ag and dry sterile dressing and instructed patient continue with home nursing 3x per week consisting of PRISMA and dry sterile dressing. -Awaiting surgery for debridement and stravix at [facility] with forefoot offloading post op shoe to use daily to left foot and after surgery -Advised patient to refrain from getting the ulceration wet and to limit activity to necessity - Augmentin completed  -Advised patient to go to the ER or return to office if the wound worsens or if constitutional symptoms are present. -Patient to return to office 2-3 weeks for follow up ulceration care or after surgery if schedule before next ulcer check or sooner if problems arise.  Landis Martins, DPM

## 2016-05-27 ENCOUNTER — Telehealth: Payer: Self-pay | Admitting: *Deleted

## 2016-05-27 NOTE — Telephone Encounter (Signed)
Calling to see if you got a letter from my doctor."  Are you referring to a medical clearance letter?  "Yes I am."  We have not received anything but I see in a note by Dr. Posey Pronto that the plan was to complete the surgical forms and fax over.

## 2016-05-27 NOTE — Telephone Encounter (Signed)
"  I'm calling you back.  I just called over to the hospital.  They said they had faxed it and they are going to fax you again.  So, if you hear anything please give me a call at your convenience."

## 2016-05-28 ENCOUNTER — Other Ambulatory Visit: Payer: Self-pay

## 2016-05-28 ENCOUNTER — Other Ambulatory Visit: Payer: Self-pay | Admitting: Internal Medicine

## 2016-05-28 DIAGNOSIS — G894 Chronic pain syndrome: Secondary | ICD-10-CM

## 2016-05-28 MED ORDER — HYDROCODONE-ACETAMINOPHEN 5-325 MG PO TABS: ORAL_TABLET | ORAL | 0 refills | Status: DC

## 2016-05-28 NOTE — Telephone Encounter (Signed)
We have not received anything from your doctor about medical clearance.  "I just spoke to them yesterday and they said they were going to send it."  Do you want to call and give them our fax number?  It is [phone].  "Okay, I will try that.  Thank you for calling me back."

## 2016-05-28 NOTE — Telephone Encounter (Signed)
"  Checking to see if you got the fax from my doctor about my foot.  Give me a call back."

## 2016-05-28 NOTE — Telephone Encounter (Signed)
Reviewed [facility] Controlled Substance Database and Hydrocodone last filled 05/02/2016.  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  His PCP, Dr. Dareen Piano saw the patient on 05/14/2016 to discuss the inappropriate UDS and the plan was to continue the hydrocodone and recheck another UDS.  Unfortunately, after review of the medical record and conferring with the lab, it looks like the order for a UDS was never placed on 05/14/2016, so the UDS was not completed as documented in the plan.  I will refill the hydrocodone-acetaminophen 5-325 mg 1 1/2 tablets Q6H PRN pain (disp #140), but [name] will be informed he will need to submit a urine for UDS when he presents to pick up the prescription (he will be informed of this at the time of his presentation).  Once a urine is submitted the prescription will be handed to him.  If the urine is not produced upon presentation he is not to receive the prescription and will be set up with his PCP to discuss the issue further.  If the urine is inappropriate once again he will also be set up to see his PCP to discuss discontinuance or taper off of medication.

## 2016-05-28 NOTE — Telephone Encounter (Signed)
Requesting pain meds to be filled.  

## 2016-05-29 ENCOUNTER — Other Ambulatory Visit: Payer: Medicare Other

## 2016-05-29 ENCOUNTER — Other Ambulatory Visit: Payer: Self-pay

## 2016-05-29 DIAGNOSIS — I129 Hypertensive chronic kidney disease with stage 1 through stage 4 chronic kidney disease, or unspecified chronic kidney disease: Secondary | ICD-10-CM | POA: Diagnosis not present

## 2016-05-29 DIAGNOSIS — Z7901 Long term (current) use of anticoagulants: Secondary | ICD-10-CM | POA: Diagnosis not present

## 2016-05-29 DIAGNOSIS — L97421 Non-pressure chronic ulcer of left heel and midfoot limited to breakdown of skin: Secondary | ICD-10-CM | POA: Diagnosis not present

## 2016-05-29 DIAGNOSIS — G894 Chronic pain syndrome: Secondary | ICD-10-CM | POA: Diagnosis not present

## 2016-05-29 DIAGNOSIS — E11621 Type 2 diabetes mellitus with foot ulcer: Secondary | ICD-10-CM | POA: Diagnosis not present

## 2016-05-29 DIAGNOSIS — E11311 Type 2 diabetes mellitus with unspecified diabetic retinopathy with macular edema: Secondary | ICD-10-CM | POA: Diagnosis not present

## 2016-05-29 DIAGNOSIS — E1142 Type 2 diabetes mellitus with diabetic polyneuropathy: Secondary | ICD-10-CM | POA: Diagnosis not present

## 2016-05-29 DIAGNOSIS — E1122 Type 2 diabetes mellitus with diabetic chronic kidney disease: Secondary | ICD-10-CM | POA: Diagnosis not present

## 2016-05-29 DIAGNOSIS — E1165 Type 2 diabetes mellitus with hyperglycemia: Secondary | ICD-10-CM | POA: Diagnosis not present

## 2016-05-29 DIAGNOSIS — Z79891 Long term (current) use of opiate analgesic: Secondary | ICD-10-CM | POA: Diagnosis not present

## 2016-05-29 DIAGNOSIS — Z794 Long term (current) use of insulin: Secondary | ICD-10-CM | POA: Diagnosis not present

## 2016-05-29 DIAGNOSIS — N183 Chronic kidney disease, stage 3 (moderate): Secondary | ICD-10-CM | POA: Diagnosis not present

## 2016-05-29 NOTE — Patient Outreach (Signed)
      Unsuccessful attempt to make contact with patient via telephone for case closure.  Plan: Send case closure letter to patient and primary care. Patient has met his case management goals as assessed during previous encounters.

## 2016-05-29 NOTE — Telephone Encounter (Signed)
Pt came in and uds was obtained, script was given afterward, pt filled out pt prescription information sheet-self.

## 2016-06-01 DIAGNOSIS — Z7901 Long term (current) use of anticoagulants: Secondary | ICD-10-CM | POA: Diagnosis not present

## 2016-06-01 DIAGNOSIS — E1142 Type 2 diabetes mellitus with diabetic polyneuropathy: Secondary | ICD-10-CM | POA: Diagnosis not present

## 2016-06-01 DIAGNOSIS — E1122 Type 2 diabetes mellitus with diabetic chronic kidney disease: Secondary | ICD-10-CM | POA: Diagnosis not present

## 2016-06-01 DIAGNOSIS — N183 Chronic kidney disease, stage 3 (moderate): Secondary | ICD-10-CM | POA: Diagnosis not present

## 2016-06-01 DIAGNOSIS — E11311 Type 2 diabetes mellitus with unspecified diabetic retinopathy with macular edema: Secondary | ICD-10-CM | POA: Diagnosis not present

## 2016-06-01 DIAGNOSIS — Z794 Long term (current) use of insulin: Secondary | ICD-10-CM | POA: Diagnosis not present

## 2016-06-01 DIAGNOSIS — Z79891 Long term (current) use of opiate analgesic: Secondary | ICD-10-CM | POA: Diagnosis not present

## 2016-06-01 DIAGNOSIS — I129 Hypertensive chronic kidney disease with stage 1 through stage 4 chronic kidney disease, or unspecified chronic kidney disease: Secondary | ICD-10-CM | POA: Diagnosis not present

## 2016-06-01 DIAGNOSIS — E11621 Type 2 diabetes mellitus with foot ulcer: Secondary | ICD-10-CM | POA: Diagnosis not present

## 2016-06-01 DIAGNOSIS — E1165 Type 2 diabetes mellitus with hyperglycemia: Secondary | ICD-10-CM | POA: Diagnosis not present

## 2016-06-01 DIAGNOSIS — L97421 Non-pressure chronic ulcer of left heel and midfoot limited to breakdown of skin: Secondary | ICD-10-CM | POA: Diagnosis not present

## 2016-06-01 NOTE — Telephone Encounter (Signed)
"  This is [name].  Thank you."

## 2016-06-03 DIAGNOSIS — Z794 Long term (current) use of insulin: Secondary | ICD-10-CM | POA: Diagnosis not present

## 2016-06-03 DIAGNOSIS — I129 Hypertensive chronic kidney disease with stage 1 through stage 4 chronic kidney disease, or unspecified chronic kidney disease: Secondary | ICD-10-CM | POA: Diagnosis not present

## 2016-06-03 DIAGNOSIS — E1165 Type 2 diabetes mellitus with hyperglycemia: Secondary | ICD-10-CM | POA: Diagnosis not present

## 2016-06-03 DIAGNOSIS — Z79891 Long term (current) use of opiate analgesic: Secondary | ICD-10-CM | POA: Diagnosis not present

## 2016-06-03 DIAGNOSIS — E1122 Type 2 diabetes mellitus with diabetic chronic kidney disease: Secondary | ICD-10-CM | POA: Diagnosis not present

## 2016-06-03 DIAGNOSIS — E11621 Type 2 diabetes mellitus with foot ulcer: Secondary | ICD-10-CM | POA: Diagnosis not present

## 2016-06-03 DIAGNOSIS — E1142 Type 2 diabetes mellitus with diabetic polyneuropathy: Secondary | ICD-10-CM | POA: Diagnosis not present

## 2016-06-03 DIAGNOSIS — Z7901 Long term (current) use of anticoagulants: Secondary | ICD-10-CM | POA: Diagnosis not present

## 2016-06-03 DIAGNOSIS — L97421 Non-pressure chronic ulcer of left heel and midfoot limited to breakdown of skin: Secondary | ICD-10-CM | POA: Diagnosis not present

## 2016-06-03 DIAGNOSIS — N183 Chronic kidney disease, stage 3 (moderate): Secondary | ICD-10-CM | POA: Diagnosis not present

## 2016-06-03 DIAGNOSIS — E11311 Type 2 diabetes mellitus with unspecified diabetic retinopathy with macular edema: Secondary | ICD-10-CM | POA: Diagnosis not present

## 2016-06-07 LAB — TOXASSURE SELECT,+ANTIDEPR,UR

## 2016-06-08 DIAGNOSIS — I129 Hypertensive chronic kidney disease with stage 1 through stage 4 chronic kidney disease, or unspecified chronic kidney disease: Secondary | ICD-10-CM | POA: Diagnosis not present

## 2016-06-08 DIAGNOSIS — E1122 Type 2 diabetes mellitus with diabetic chronic kidney disease: Secondary | ICD-10-CM | POA: Diagnosis not present

## 2016-06-08 DIAGNOSIS — L97421 Non-pressure chronic ulcer of left heel and midfoot limited to breakdown of skin: Secondary | ICD-10-CM | POA: Diagnosis not present

## 2016-06-08 DIAGNOSIS — N183 Chronic kidney disease, stage 3 (moderate): Secondary | ICD-10-CM | POA: Diagnosis not present

## 2016-06-08 DIAGNOSIS — E11621 Type 2 diabetes mellitus with foot ulcer: Secondary | ICD-10-CM | POA: Diagnosis not present

## 2016-06-08 DIAGNOSIS — E1165 Type 2 diabetes mellitus with hyperglycemia: Secondary | ICD-10-CM | POA: Diagnosis not present

## 2016-06-08 DIAGNOSIS — Z794 Long term (current) use of insulin: Secondary | ICD-10-CM | POA: Diagnosis not present

## 2016-06-08 DIAGNOSIS — E11311 Type 2 diabetes mellitus with unspecified diabetic retinopathy with macular edema: Secondary | ICD-10-CM | POA: Diagnosis not present

## 2016-06-08 DIAGNOSIS — Z79891 Long term (current) use of opiate analgesic: Secondary | ICD-10-CM | POA: Diagnosis not present

## 2016-06-08 DIAGNOSIS — Z7901 Long term (current) use of anticoagulants: Secondary | ICD-10-CM | POA: Diagnosis not present

## 2016-06-08 DIAGNOSIS — E1142 Type 2 diabetes mellitus with diabetic polyneuropathy: Secondary | ICD-10-CM | POA: Diagnosis not present

## 2016-06-08 NOTE — Telephone Encounter (Signed)
"  I'm calling to see if you got the letter yet."  We have not received anything yet.  "I gave them the fax number you gave me.  What is going on with these people?  I will call them again."

## 2016-06-09 ENCOUNTER — Telehealth: Payer: Self-pay | Admitting: Pharmacist

## 2016-06-09 NOTE — Telephone Encounter (Signed)
I am calling to let you know Dr. Cannon Kettle received your medical clearance from Dr. Dareen Piano.  We can schedule your surgery.  When would you like to schedule it?  "I can do whatever is available."  She can do it on Monday December 4th at [facility].  It will be around lunch time.  You should hear from someone from the surgical center with the arrival time and other instructions sometime this week.

## 2016-06-10 ENCOUNTER — Telehealth: Payer: Self-pay | Admitting: *Deleted

## 2016-06-10 ENCOUNTER — Other Ambulatory Visit: Payer: Self-pay | Admitting: Internal Medicine

## 2016-06-10 ENCOUNTER — Encounter (HOSPITAL_BASED_OUTPATIENT_CLINIC_OR_DEPARTMENT_OTHER): Payer: Self-pay | Admitting: *Deleted

## 2016-06-10 DIAGNOSIS — I129 Hypertensive chronic kidney disease with stage 1 through stage 4 chronic kidney disease, or unspecified chronic kidney disease: Secondary | ICD-10-CM | POA: Diagnosis not present

## 2016-06-10 DIAGNOSIS — N183 Chronic kidney disease, stage 3 (moderate): Secondary | ICD-10-CM | POA: Diagnosis not present

## 2016-06-10 DIAGNOSIS — E11311 Type 2 diabetes mellitus with unspecified diabetic retinopathy with macular edema: Secondary | ICD-10-CM | POA: Diagnosis not present

## 2016-06-10 DIAGNOSIS — E11621 Type 2 diabetes mellitus with foot ulcer: Secondary | ICD-10-CM | POA: Diagnosis not present

## 2016-06-10 DIAGNOSIS — E1122 Type 2 diabetes mellitus with diabetic chronic kidney disease: Secondary | ICD-10-CM | POA: Diagnosis not present

## 2016-06-10 DIAGNOSIS — Z7901 Long term (current) use of anticoagulants: Secondary | ICD-10-CM | POA: Diagnosis not present

## 2016-06-10 DIAGNOSIS — Z794 Long term (current) use of insulin: Secondary | ICD-10-CM | POA: Diagnosis not present

## 2016-06-10 DIAGNOSIS — E1165 Type 2 diabetes mellitus with hyperglycemia: Secondary | ICD-10-CM | POA: Diagnosis not present

## 2016-06-10 DIAGNOSIS — L97421 Non-pressure chronic ulcer of left heel and midfoot limited to breakdown of skin: Secondary | ICD-10-CM | POA: Diagnosis not present

## 2016-06-10 DIAGNOSIS — Z79891 Long term (current) use of opiate analgesic: Secondary | ICD-10-CM | POA: Diagnosis not present

## 2016-06-10 DIAGNOSIS — E1142 Type 2 diabetes mellitus with diabetic polyneuropathy: Secondary | ICD-10-CM | POA: Diagnosis not present

## 2016-06-10 NOTE — Telephone Encounter (Signed)
"  I just spoke with [name], patient of Dr. Cannon Kettle.  He said he takes Eliquis.  He said no one has told him when to stop the Eliquis.  I suggested that he not take any on Saturday but I am not able to make that call.  Can ask Dr. Cannon Kettle what she wants him to do?"  I will ask Dr. Cannon Kettle.

## 2016-06-10 NOTE — Progress Notes (Signed)
Chart reviewed by Dr Ola Spurr. Patient will need a UDS before surgery, otherwise OK for [facility].

## 2016-06-10 NOTE — Progress Notes (Signed)
[name] is a 65 y.o. male who was contacted on behalf of [facility] Geriatrics Task Force.   Diabetes Assessment  DM meds and BS checks -  "What medications are you taking for diabetes?" Toujeo, Humulin, Victoza -  "How often are you checking your blood sugars at home?" 3-4 times a day - "What have your blood sugars been?" 100s-200s  High Blood Pressure Assessment  BP meds & BP checks -  "What medications are you taking for high blood pressure?" enalapril, metoprolol, furosemide - "How often are you checking your blood pressure at home?" does not check  Coping with DM and BP - What else are you doing to help yourself with your DM and BP - Diet? yes Exercise? yes  Medication Access Issues  Medication Issues? -  "Are you getting your medicines refilled on time without skipping any doses?" yes - "Are you having any problems getting/taking your medicines? - cost, timing, transportation?" no  Conclusion  Close the call - "Do you have your next appointment scheduled? - give pt date and time if needed (make appt for them if needed before calling) yes - "Any other questions or concerns?" no  -

## 2016-06-10 NOTE — Telephone Encounter (Signed)
I left a message for patient to stop his Eliquis starting tomorrow.  He will resume Eliquis after surgery.  I asked him to call if he has any questions.

## 2016-06-10 NOTE — Telephone Encounter (Signed)
Can you let him know to make today his last dose and after surgery he will resume Eliquis. Thanks -Dr. Cannon Kettle

## 2016-06-10 NOTE — Progress Notes (Signed)
   06/10/16 1151  OBSTRUCTIVE SLEEP APNEA  Have you ever been diagnosed with sleep apnea through a sleep study? Yes  If yes, do you have and use a CPAP or BPAP machine every night? 1  Do you know the presssure settings on your maching? Yes  Do you snore loudly (loud enough to be heard through closed doors)?  1  Do you often feel tired, fatigued, or sleepy during the daytime (such as falling asleep during driving or talking to someone)? 0  Has anyone observed you stop breathing during your sleep? 0  Do you have, or are you being treated for high blood pressure? 1  BMI more than 35 kg/m2? 1  Age > 50 (1-yes) 1  Neck circumference greater than:Male 16 inches or larger, Male 17inches or larger? 0  Male Gender (Yes=1) 1  Obstructive Sleep Apnea Score 5

## 2016-06-11 ENCOUNTER — Other Ambulatory Visit: Payer: Self-pay | Admitting: Internal Medicine

## 2016-06-12 ENCOUNTER — Encounter (HOSPITAL_BASED_OUTPATIENT_CLINIC_OR_DEPARTMENT_OTHER)
Admission: RE | Admit: 2016-06-12 | Discharge: 2016-06-12 | Disposition: A | Discharge status: Home / Self Care | Payer: Medicare Other | Source: Ambulatory Visit | Attending: Sports Medicine | Admitting: Sports Medicine

## 2016-06-12 ENCOUNTER — Other Ambulatory Visit: Payer: Self-pay

## 2016-06-12 DIAGNOSIS — Z794 Long term (current) use of insulin: Secondary | ICD-10-CM | POA: Diagnosis not present

## 2016-06-12 DIAGNOSIS — G4733 Obstructive sleep apnea (adult) (pediatric): Secondary | ICD-10-CM | POA: Diagnosis not present

## 2016-06-12 DIAGNOSIS — Z833 Family history of diabetes mellitus: Secondary | ICD-10-CM | POA: Diagnosis not present

## 2016-06-12 DIAGNOSIS — Z89422 Acquired absence of other left toe(s): Secondary | ICD-10-CM | POA: Diagnosis not present

## 2016-06-12 DIAGNOSIS — M19022 Primary osteoarthritis, left elbow: Secondary | ICD-10-CM | POA: Diagnosis not present

## 2016-06-12 DIAGNOSIS — H903 Sensorineural hearing loss, bilateral: Secondary | ICD-10-CM | POA: Diagnosis not present

## 2016-06-12 DIAGNOSIS — L97421 Non-pressure chronic ulcer of left heel and midfoot limited to breakdown of skin: Secondary | ICD-10-CM | POA: Diagnosis not present

## 2016-06-12 DIAGNOSIS — M170 Bilateral primary osteoarthritis of knee: Secondary | ICD-10-CM | POA: Diagnosis not present

## 2016-06-12 DIAGNOSIS — E1142 Type 2 diabetes mellitus with diabetic polyneuropathy: Secondary | ICD-10-CM | POA: Diagnosis not present

## 2016-06-12 DIAGNOSIS — Z6841 Body Mass Index (BMI) 40.0 and over, adult: Secondary | ICD-10-CM | POA: Diagnosis not present

## 2016-06-12 DIAGNOSIS — J45909 Unspecified asthma, uncomplicated: Secondary | ICD-10-CM | POA: Diagnosis not present

## 2016-06-12 DIAGNOSIS — Z881 Allergy status to other antibiotic agents status: Secondary | ICD-10-CM | POA: Diagnosis not present

## 2016-06-12 DIAGNOSIS — N183 Chronic kidney disease, stage 3 (moderate): Secondary | ICD-10-CM | POA: Diagnosis not present

## 2016-06-12 DIAGNOSIS — E785 Hyperlipidemia, unspecified: Secondary | ICD-10-CM | POA: Diagnosis not present

## 2016-06-12 DIAGNOSIS — Z87891 Personal history of nicotine dependence: Secondary | ICD-10-CM | POA: Diagnosis not present

## 2016-06-12 DIAGNOSIS — E1165 Type 2 diabetes mellitus with hyperglycemia: Secondary | ICD-10-CM | POA: Diagnosis not present

## 2016-06-12 DIAGNOSIS — Z79891 Long term (current) use of opiate analgesic: Secondary | ICD-10-CM | POA: Diagnosis not present

## 2016-06-12 DIAGNOSIS — E11311 Type 2 diabetes mellitus with unspecified diabetic retinopathy with macular edema: Secondary | ICD-10-CM | POA: Diagnosis not present

## 2016-06-12 DIAGNOSIS — Z8249 Family history of ischemic heart disease and other diseases of the circulatory system: Secondary | ICD-10-CM | POA: Diagnosis not present

## 2016-06-12 DIAGNOSIS — Z7901 Long term (current) use of anticoagulants: Secondary | ICD-10-CM | POA: Diagnosis not present

## 2016-06-12 DIAGNOSIS — Z800 Family history of malignant neoplasm of digestive organs: Secondary | ICD-10-CM | POA: Diagnosis not present

## 2016-06-12 DIAGNOSIS — E1122 Type 2 diabetes mellitus with diabetic chronic kidney disease: Secondary | ICD-10-CM | POA: Diagnosis not present

## 2016-06-12 DIAGNOSIS — M19021 Primary osteoarthritis, right elbow: Secondary | ICD-10-CM | POA: Diagnosis not present

## 2016-06-12 DIAGNOSIS — I129 Hypertensive chronic kidney disease with stage 1 through stage 4 chronic kidney disease, or unspecified chronic kidney disease: Secondary | ICD-10-CM | POA: Diagnosis not present

## 2016-06-12 DIAGNOSIS — K219 Gastro-esophageal reflux disease without esophagitis: Secondary | ICD-10-CM | POA: Diagnosis not present

## 2016-06-12 DIAGNOSIS — E1140 Type 2 diabetes mellitus with diabetic neuropathy, unspecified: Secondary | ICD-10-CM | POA: Diagnosis not present

## 2016-06-12 DIAGNOSIS — D631 Anemia in chronic kidney disease: Secondary | ICD-10-CM | POA: Diagnosis not present

## 2016-06-12 DIAGNOSIS — N529 Male erectile dysfunction, unspecified: Secondary | ICD-10-CM | POA: Diagnosis not present

## 2016-06-12 DIAGNOSIS — E11621 Type 2 diabetes mellitus with foot ulcer: Secondary | ICD-10-CM | POA: Diagnosis not present

## 2016-06-12 LAB — BASIC METABOLIC PANEL
Anion gap: 6 (ref 5–15)
BUN: 22 mg/dL — AB (ref 6–20)
CALCIUM: 9.3 mg/dL (ref 8.9–10.3)
CO2: 26 mmol/L (ref 22–32)
Chloride: 106 mmol/L (ref 101–111)
Creatinine, Ser: 1.88 mg/dL — ABNORMAL HIGH (ref 0.61–1.24)
GFR calc Af Amer: 41 mL/min — ABNORMAL LOW (ref >60)
GFR, EST NON AFRICAN AMERICAN: 36 mL/min — AB (ref >60)
GLUCOSE: 128 mg/dL — AB (ref 65–99)
POTASSIUM: 4.3 mmol/L (ref 3.5–5.1)
SODIUM: 138 mmol/L (ref 135–145)

## 2016-06-12 LAB — RAPID URINE DRUG SCREEN, HOSP PERFORMED
Amphetamines: NOT DETECTED
Barbiturates: NOT DETECTED
Benzodiazepines: NOT DETECTED
Cocaine: NOT DETECTED
OPIATES: NOT DETECTED
Tetrahydrocannabinol: NOT DETECTED

## 2016-06-15 ENCOUNTER — Encounter (HOSPITAL_BASED_OUTPATIENT_CLINIC_OR_DEPARTMENT_OTHER)
Admission: RE | Disposition: A | Discharge status: Home / Self Care | Payer: Self-pay | Source: Ambulatory Visit | Attending: Sports Medicine

## 2016-06-15 ENCOUNTER — Ambulatory Visit (HOSPITAL_BASED_OUTPATIENT_CLINIC_OR_DEPARTMENT_OTHER): Payer: Medicare Other | Admitting: Anesthesiology

## 2016-06-15 ENCOUNTER — Ambulatory Visit (HOSPITAL_BASED_OUTPATIENT_CLINIC_OR_DEPARTMENT_OTHER)
Admission: RE | Admit: 2016-06-15 | Discharge: 2016-06-15 | Disposition: A | Discharge status: Home / Self Care | Payer: Medicare Other | Source: Ambulatory Visit | Attending: Sports Medicine | Admitting: Sports Medicine

## 2016-06-15 ENCOUNTER — Encounter (HOSPITAL_BASED_OUTPATIENT_CLINIC_OR_DEPARTMENT_OTHER): Payer: Self-pay | Admitting: Anesthesiology

## 2016-06-15 ENCOUNTER — Encounter: Payer: Self-pay | Admitting: Sports Medicine

## 2016-06-15 ENCOUNTER — Telehealth: Payer: Self-pay | Admitting: *Deleted

## 2016-06-15 DIAGNOSIS — M170 Bilateral primary osteoarthritis of knee: Secondary | ICD-10-CM | POA: Diagnosis not present

## 2016-06-15 DIAGNOSIS — N183 Chronic kidney disease, stage 3 (moderate): Secondary | ICD-10-CM | POA: Insufficient documentation

## 2016-06-15 DIAGNOSIS — D631 Anemia in chronic kidney disease: Secondary | ICD-10-CM | POA: Insufficient documentation

## 2016-06-15 DIAGNOSIS — Z794 Long term (current) use of insulin: Secondary | ICD-10-CM | POA: Insufficient documentation

## 2016-06-15 DIAGNOSIS — E1140 Type 2 diabetes mellitus with diabetic neuropathy, unspecified: Secondary | ICD-10-CM | POA: Diagnosis not present

## 2016-06-15 DIAGNOSIS — M19022 Primary osteoarthritis, left elbow: Secondary | ICD-10-CM | POA: Diagnosis not present

## 2016-06-15 DIAGNOSIS — G4733 Obstructive sleep apnea (adult) (pediatric): Secondary | ICD-10-CM | POA: Insufficient documentation

## 2016-06-15 DIAGNOSIS — H903 Sensorineural hearing loss, bilateral: Secondary | ICD-10-CM | POA: Insufficient documentation

## 2016-06-15 DIAGNOSIS — Z800 Family history of malignant neoplasm of digestive organs: Secondary | ICD-10-CM | POA: Insufficient documentation

## 2016-06-15 DIAGNOSIS — K219 Gastro-esophageal reflux disease without esophagitis: Secondary | ICD-10-CM | POA: Insufficient documentation

## 2016-06-15 DIAGNOSIS — E11621 Type 2 diabetes mellitus with foot ulcer: Secondary | ICD-10-CM | POA: Insufficient documentation

## 2016-06-15 DIAGNOSIS — Z881 Allergy status to other antibiotic agents status: Secondary | ICD-10-CM | POA: Insufficient documentation

## 2016-06-15 DIAGNOSIS — J45909 Unspecified asthma, uncomplicated: Secondary | ICD-10-CM | POA: Diagnosis not present

## 2016-06-15 DIAGNOSIS — N529 Male erectile dysfunction, unspecified: Secondary | ICD-10-CM | POA: Insufficient documentation

## 2016-06-15 DIAGNOSIS — I129 Hypertensive chronic kidney disease with stage 1 through stage 4 chronic kidney disease, or unspecified chronic kidney disease: Secondary | ICD-10-CM | POA: Diagnosis not present

## 2016-06-15 DIAGNOSIS — L97529 Non-pressure chronic ulcer of other part of left foot with unspecified severity: Secondary | ICD-10-CM | POA: Diagnosis not present

## 2016-06-15 DIAGNOSIS — E785 Hyperlipidemia, unspecified: Secondary | ICD-10-CM | POA: Insufficient documentation

## 2016-06-15 DIAGNOSIS — Z8249 Family history of ischemic heart disease and other diseases of the circulatory system: Secondary | ICD-10-CM | POA: Diagnosis not present

## 2016-06-15 DIAGNOSIS — E1122 Type 2 diabetes mellitus with diabetic chronic kidney disease: Secondary | ICD-10-CM | POA: Insufficient documentation

## 2016-06-15 DIAGNOSIS — Z833 Family history of diabetes mellitus: Secondary | ICD-10-CM | POA: Diagnosis not present

## 2016-06-15 DIAGNOSIS — Z89422 Acquired absence of other left toe(s): Secondary | ICD-10-CM | POA: Diagnosis not present

## 2016-06-15 DIAGNOSIS — M19021 Primary osteoarthritis, right elbow: Secondary | ICD-10-CM | POA: Insufficient documentation

## 2016-06-15 DIAGNOSIS — Z9989 Dependence on other enabling machines and devices: Secondary | ICD-10-CM | POA: Insufficient documentation

## 2016-06-15 DIAGNOSIS — Z6841 Body Mass Index (BMI) 40.0 and over, adult: Secondary | ICD-10-CM | POA: Insufficient documentation

## 2016-06-15 DIAGNOSIS — Z87891 Personal history of nicotine dependence: Secondary | ICD-10-CM | POA: Diagnosis not present

## 2016-06-15 DIAGNOSIS — I10 Essential (primary) hypertension: Secondary | ICD-10-CM | POA: Diagnosis not present

## 2016-06-15 HISTORY — PX: WOUND DEBRIDEMENT: SHX247

## 2016-06-15 HISTORY — DX: Obstructive sleep apnea (adult) (pediatric): G47.33

## 2016-06-15 LAB — GLUCOSE, CAPILLARY
GLUCOSE-CAPILLARY: 189 mg/dL — AB (ref 65–99)
Glucose-Capillary: 169 mg/dL — ABNORMAL HIGH (ref 65–99)

## 2016-06-15 SURGERY — DEBRIDEMENT, WOUND
Anesthesia: Monitor Anesthesia Care | Site: Foot | Laterality: Left

## 2016-06-15 MED ORDER — MIDAZOLAM HCL 2 MG/2ML IJ SOLN
1.0000 mg | INTRAMUSCULAR | Status: DC | PRN
  Administered 2016-06-15: 2 mg via INTRAVENOUS

## 2016-06-15 MED ORDER — ONDANSETRON HCL 4 MG/2ML IJ SOLN
INTRAMUSCULAR | Status: DC | PRN
  Administered 2016-06-15: 4 mg via INTRAVENOUS

## 2016-06-15 MED ORDER — CHLORHEXIDINE GLUCONATE CLOTH 2 % EX PADS: 6.0000 | MEDICATED_PAD | Freq: Once | CUTANEOUS | Status: DC

## 2016-06-15 MED ORDER — ONDANSETRON HCL 4 MG/2ML IJ SOLN
INTRAMUSCULAR | Status: AC
  Filled 2016-06-15: qty 2

## 2016-06-15 MED ORDER — LIDOCAINE HCL (PF) 1 % IJ SOLN
INTRAMUSCULAR | Status: AC
  Filled 2016-06-15: qty 30

## 2016-06-15 MED ORDER — FENTANYL CITRATE (PF) 100 MCG/2ML IJ SOLN: 25.0000 ug | INTRAMUSCULAR | Status: DC | PRN

## 2016-06-15 MED ORDER — MIDAZOLAM HCL 2 MG/2ML IJ SOLN
INTRAMUSCULAR | Status: AC
  Filled 2016-06-15: qty 2

## 2016-06-15 MED ORDER — CEFAZOLIN SODIUM-DEXTROSE 2-4 GM/100ML-% IV SOLN
INTRAVENOUS | Status: AC
  Filled 2016-06-15: qty 200

## 2016-06-15 MED ORDER — LACTATED RINGERS IV SOLN
INTRAVENOUS | Status: DC
  Administered 2016-06-15: 12:00:00 via INTRAVENOUS

## 2016-06-15 MED ORDER — SODIUM CHLORIDE 0.9 % IR SOLN
Status: DC | PRN
  Administered 2016-06-15: 600 mL

## 2016-06-15 MED ORDER — FENTANYL CITRATE (PF) 100 MCG/2ML IJ SOLN
INTRAMUSCULAR | Status: AC
  Filled 2016-06-15: qty 2

## 2016-06-15 MED ORDER — DOCUSATE SODIUM 100 MG PO CAPS: 100.0000 mg | ORAL_CAPSULE | Freq: Every day | ORAL | 0 refills | Status: DC | PRN

## 2016-06-15 MED ORDER — CEFAZOLIN SODIUM 10 G IJ SOLR
3.0000 g | INTRAMUSCULAR | Status: AC
  Administered 2016-06-15: 3 g via INTRAVENOUS

## 2016-06-15 MED ORDER — HYDROCODONE-ACETAMINOPHEN 5-325 MG PO TABS
ORAL_TABLET | ORAL | Status: AC
  Filled 2016-06-15: qty 1

## 2016-06-15 MED ORDER — FENTANYL CITRATE (PF) 100 MCG/2ML IJ SOLN
50.0000 ug | INTRAMUSCULAR | Status: DC | PRN
  Administered 2016-06-15: 100 ug via INTRAVENOUS

## 2016-06-15 MED ORDER — HYDROCODONE-ACETAMINOPHEN 5-325 MG PO TABS
1.0000 | ORAL_TABLET | Freq: Once | ORAL | Status: AC
  Administered 2016-06-15: 1 via ORAL

## 2016-06-15 MED ORDER — SCOPOLAMINE 1 MG/3DAYS TD PT72: 1.0000 | MEDICATED_PATCH | Freq: Once | TRANSDERMAL | Status: DC | PRN

## 2016-06-15 MED ORDER — PROPOFOL 500 MG/50ML IV EMUL
INTRAVENOUS | Status: DC | PRN
  Administered 2016-06-15: 50 ug/kg/min via INTRAVENOUS

## 2016-06-15 MED ORDER — PROPOFOL 10 MG/ML IV BOLUS
INTRAVENOUS | Status: DC | PRN
  Administered 2016-06-15: 30 mg via INTRAVENOUS
  Administered 2016-06-15: 20 mg via INTRAVENOUS
  Administered 2016-06-15: 30 mg via INTRAVENOUS

## 2016-06-15 MED ORDER — PROPOFOL 500 MG/50ML IV EMUL
INTRAVENOUS | Status: AC
  Filled 2016-06-15: qty 50

## 2016-06-15 MED ORDER — PROMETHAZINE HCL 25 MG PO TABS: 25.0000 mg | ORAL_TABLET | Freq: Three times a day (TID) | ORAL | 2 refills | Status: DC | PRN

## 2016-06-15 MED ORDER — LIDOCAINE HCL 1 % IJ SOLN
INTRAMUSCULAR | Status: DC | PRN
  Administered 2016-06-15: 10 mL

## 2016-06-15 MED ORDER — HYDROCODONE-ACETAMINOPHEN 7.5-325 MG PO TABS: 1.0000 | ORAL_TABLET | Freq: Four times a day (QID) | ORAL | 0 refills | Status: DC | PRN

## 2016-06-15 SURGICAL SUPPLY — 66 items
BANDAGE ACE 3X5.8 VEL STRL LF (GAUZE/BANDAGES/DRESSINGS) ×1 IMPLANT
BANDAGE ACE 4X5 VEL STRL LF (GAUZE/BANDAGES/DRESSINGS) ×2 IMPLANT
BLADE SURG 15 STRL LF DISP TIS (BLADE) ×2 IMPLANT
BLADE SURG 15 STRL SS (BLADE) ×6
BNDG CMPR 9X4 STRL LF SNTH (GAUZE/BANDAGES/DRESSINGS) ×1
BNDG COHESIVE 4X5 TAN STRL (GAUZE/BANDAGES/DRESSINGS) ×2 IMPLANT
BNDG CONFORM 2 STRL LF (GAUZE/BANDAGES/DRESSINGS) ×1 IMPLANT
BNDG ESMARK 4X9 LF (GAUZE/BANDAGES/DRESSINGS) ×3 IMPLANT
BNDG GAUZE ELAST 4 BULKY (GAUZE/BANDAGES/DRESSINGS) ×3 IMPLANT
CANISTER SUCT 1200ML W/VALVE (MISCELLANEOUS) ×2 IMPLANT
CLOSURE WOUND 1/2 X4 (GAUZE/BANDAGES/DRESSINGS) ×1
COVER BACK TABLE 60X90IN (DRAPES) ×3 IMPLANT
CUFF TOURNIQUET SINGLE 18IN (TOURNIQUET CUFF) ×2 IMPLANT
CUFF TOURNIQUET SINGLE 24IN (TOURNIQUET CUFF) IMPLANT
DERMACARRIERS GRAFT 1 TO 1.5 (DISPOSABLE) ×3
DRAPE EXTREMITY T 121X128X90 (DRAPE) ×3 IMPLANT
DRAPE IMP U-DRAPE 54X76 (DRAPES) ×3 IMPLANT
DRAPE U-SHAPE 47X51 STRL (DRAPES) ×3 IMPLANT
DRSG EMULSION OIL 3X3 NADH (GAUZE/BANDAGES/DRESSINGS) ×3 IMPLANT
DRSG PAD ABDOMINAL 8X10 ST (GAUZE/BANDAGES/DRESSINGS) ×2 IMPLANT
DURAPREP 26ML APPLICATOR (WOUND CARE) ×1 IMPLANT
ELECT REM PT RETURN 9FT ADLT (ELECTROSURGICAL) ×3
ELECTRODE REM PT RTRN 9FT ADLT (ELECTROSURGICAL) ×1 IMPLANT
GAUZE SPONGE 4X4 12PLY STRL (GAUZE/BANDAGES/DRESSINGS) ×3 IMPLANT
GAUZE SPONGE 4X4 16PLY XRAY LF (GAUZE/BANDAGES/DRESSINGS) IMPLANT
GLOVE BIO SURGEON STRL SZ 6.5 (GLOVE) ×2 IMPLANT
GLOVE BIO SURGEONS STRL SZ 6.5 (GLOVE) ×1
GLOVE BIOGEL PI IND STRL 6.5 (GLOVE) ×2 IMPLANT
GLOVE BIOGEL PI INDICATOR 6.5 (GLOVE) ×2
GOWN STRL REUS W/ TWL LRG LVL3 (GOWN DISPOSABLE) ×1 IMPLANT
GOWN STRL REUS W/TWL LRG LVL3 (GOWN DISPOSABLE) ×6
GRAFT DERMACARRIERS 1 TO 1.5 (DISPOSABLE) IMPLANT
IMPL STRAVIX 2X4 (Tissue) IMPLANT
IMPLANT STRAVIX 2X4 (Tissue) ×3 IMPLANT
NDL HYPO 25X1 1.5 SAFETY (NEEDLE) ×2 IMPLANT
NDL SAFETY ECLIPSE 18X1.5 (NEEDLE) IMPLANT
NEEDLE HYPO 18GX1.5 SHARP (NEEDLE)
NEEDLE HYPO 25X1 1.5 SAFETY (NEEDLE) ×3 IMPLANT
NS IRRIG 1000ML POUR BTL (IV SOLUTION) ×2 IMPLANT
PACK BASIN DAY SURGERY FS (CUSTOM PROCEDURE TRAY) ×3 IMPLANT
PADDING CAST ABS 4INX4YD NS (CAST SUPPLIES)
PADDING CAST ABS COTTON 4X4 ST (CAST SUPPLIES) ×1 IMPLANT
PENCIL BUTTON HOLSTER BLD 10FT (ELECTRODE) ×3 IMPLANT
PROBE DEBRIDE SONICVAC MISONIX (TIP) ×1 IMPLANT
SLEEVE SCD COMPRESS KNEE MED (MISCELLANEOUS) IMPLANT
STOCKINETTE 6  STRL (DRAPES) ×2
STOCKINETTE 6 STRL (DRAPES) ×1 IMPLANT
STRIP CLOSURE SKIN 1/2X4 (GAUZE/BANDAGES/DRESSINGS) ×1 IMPLANT
STRIP SUTURE WOUND CLOSURE 1/2 (SUTURE) IMPLANT
SUT ETHILON 3 0 PS 1 (SUTURE) ×1 IMPLANT
SUT MERSILENE 2.0 SH NDLE (SUTURE) ×1 IMPLANT
SUT MNCRL AB 3-0 PS2 18 (SUTURE) ×1 IMPLANT
SUT MNCRL AB 4-0 PS2 18 (SUTURE) IMPLANT
SUT MON AB 5-0 PS2 18 (SUTURE) ×1 IMPLANT
SUT VIC AB 3-0 SH 27 (SUTURE) ×3
SUT VIC AB 3-0 SH 27X BRD (SUTURE) IMPLANT
SWAB COLLECTION DEVICE MRSA (MISCELLANEOUS) ×2 IMPLANT
SYR 10ML LL (SYRINGE) ×4 IMPLANT
SYR BULB 3OZ (MISCELLANEOUS) ×3 IMPLANT
TIP PROBE CYLINDRICAL MISONIX (TIP) ×1 IMPLANT
TRAY DSU PREP LF (CUSTOM PROCEDURE TRAY) ×2 IMPLANT
TUBE CONNECTING 20'X1/4 (TUBING) ×1
TUBE CONNECTING 20X1/4 (TUBING) ×1 IMPLANT
TUBE IRRIGATION SET MISONIX (TUBING) ×1 IMPLANT
UNDERPAD 30X30 (UNDERPADS AND DIAPERS) ×3 IMPLANT
YANKAUER SUCT BULB TIP NO VENT (SUCTIONS) ×2 IMPLANT

## 2016-06-15 NOTE — Discharge Instructions (Signed)
See [place] instructions in chart   Post Anesthesia Home Care Instructions  Activity: Get plenty of rest for the remainder of the day. A responsible adult should stay with you for 24 hours following the procedure.  For the next 24 hours, DO NOT: -Drive a car -Paediatric nurse -Drink alcoholic beverages -Take any medication unless instructed by your physician -Make any legal decisions or sign important papers.  Meals: Start with liquid foods such as gelatin or soup. Progress to regular foods as tolerated. Avoid greasy, spicy, heavy foods. If nausea and/or vomiting occur, drink only clear liquids until the nausea and/or vomiting subsides. Call your physician if vomiting continues.  Special Instructions/Symptoms: Your throat may feel dry or sore from the anesthesia or the breathing tube placed in your throat during surgery. If this causes discomfort, gargle with warm salt water. The discomfort should disappear within 24 hours.  If you had a scopolamine patch placed behind your ear for the management of post- operative nausea and/or vomiting:  1. The medication in the patch is effective for 72 hours, after which it should be removed.  Wrap patch in a tissue and discard in the trash. Wash hands thoroughly with soap and water. 2. You may remove the patch earlier than 72 hours if you experience unpleasant side effects which may include dry mouth, dizziness or visual disturbances. 3. Avoid touching the patch. Wash your hands with soap and water after contact with the patch.    Post Anesthesia Home Care Instructions  Activity: Get plenty of rest for the remainder of the day. A responsible adult should stay with you for 24 hours following the procedure.  For the next 24 hours, DO NOT: -Drive a car -Paediatric nurse -Drink alcoholic beverages -Take any medication unless instructed by your physician -Make any legal decisions or sign important papers.  Meals: Start with liquid  foods such as gelatin or soup. Progress to regular foods as tolerated. Avoid greasy, spicy, heavy foods. If nausea and/or vomiting occur, drink only clear liquids until the nausea and/or vomiting subsides. Call your physician if vomiting continues.  Special Instructions/Symptoms: Your throat may feel dry or sore from the anesthesia or the breathing tube placed in your throat during surgery. If this causes discomfort, gargle with warm salt water. The discomfort should disappear within 24 hours.  If you had a scopolamine patch placed behind your ear for the management of post- operative nausea and/or vomiting:  1. The medication in the patch is effective for 72 hours, after which it should be removed.  Wrap patch in a tissue and discard in the trash. Wash hands thoroughly with soap and water. 2. You may remove the patch earlier than 72 hours if you experience unpleasant side effects which may include dry mouth, dizziness or visual disturbances. 3. Avoid touching the patch. Wash your hands with soap and water after contact with the patch.

## 2016-06-15 NOTE — Telephone Encounter (Signed)
Left message for pt to call with the pharmacy he would like the promethazine and the colace to be called to.

## 2016-06-15 NOTE — Op Note (Signed)
DATE: 06-15-2016  SURGEON: Landis Martins, DPM  PREOPERATIVE DIAGNOSIS: Chronic Left diabetic foot ulceration  POSTOPERATIVE DIAGNOSIS:Same  PROCEDURE PERFORMED:Left wound debridement (Stravix) and placement of Stravix 2x4cm Allograft  HEMOSTASIS: Left ankle tourniquet  ESTIMATED BLOOD LOSS: Minimal  ANESTHESIA: MAC with local 10cc of 1:1 mixture 1% lidocaine and 0.5% marcaine plain  SPECIMENS: Soft tissue/wound to microbiology  MICRO: Wound cultures  COMPLICATIONS: None.  INDICATIONS FOR PROCEDURE: This patient is a pleasant 65 y.o. diabetic male patient who has been seen on many occassions for Left foot ulceration without resolution with conservative wound care. At this time, all risks, complications, benefits, and alternatives were explained in detail to the patient. Patient opts for Left foot wound debridement with placement of graft. Risks and complications include but are not limited to infection, recurrence of symptoms, pain, numbness, wound dehiscence, delayed healing, as well as need for future surgery/amputation. No guarantees were given or applied. All questions were answered to the patient's satisfaction, and the patient has consented to the above procedure. All preoperative labs and H&P, medical clearances have been obtained and NPO status past midnight has been confirmed.     PREPARATION FOR PROCEDURE: The patient was brought to the operating room and placed on the operating table in supine position. A pneumatic ankle tourniquet was placed about the patient's left foot but not yet inflated. After the department of anesthesia had administered MAC anesthesia. A local block was administered andthe left foot was then scrubbed, prepped, and draped in the usual aseptic manner. An Esmarch bandage was utilized to exsanguinate the patient's left foot and leg, and the pneumatic tourniquet was inflated to 250 mmHg.     PROCEDURE IN DETAIL: At this time,  attention was directed to the patient's Left foot where there was of note a full thickness ulceration plantar aspect of the foot that measures 2x2.5cm pre and 4x4.5 cm post debridement, the wound was thoroughly debrided using Misonix ultrasound debrider removing all nonviable tissue. Intraop wound cultures were taken via swab and sent to microbiology. Then Stravix allograft was secured to wound bed using  3-0 vicryl. The left foot was then dressed with adaptic overlying the suture sites adaptic with steristrips, 4 x 4 gauze, abd, Kerlix, and Coban and ACE. At this time, the left pneumatic tourniquet was deflated, and a positive hyperemic response was noted left digits. The patient tolerated the procedure and anesthesia well. Upon transfer to the recovery room, the patient's vital signs were stable, and neurovascular status was intact.   Postoperative prescriptions and instructions were written and given to the patient who will return to the office of Dr. Cannon Kettle in 1 week for continued care and management of this patient.  Landis Martins, DPM

## 2016-06-15 NOTE — Anesthesia Preprocedure Evaluation (Addendum)
Anesthesia Evaluation  Patient identified by MRN, date of birth, ID band Patient awake    Reviewed: Allergy & Precautions, NPO status , Patient's Chart, lab work & pertinent test results, reviewed documented beta blocker date and time   Airway Mallampati: II  TM Distance: >3 FB Neck ROM: Full    Dental   Pulmonary asthma , sleep apnea and Continuous Positive Airway Pressure Ventilation , former smoker,    breath sounds clear to auscultation       Cardiovascular hypertension, Pt. on medications and Pt. on home beta blockers  Rhythm:Regular Rate:Normal     Neuro/Psych  Neuromuscular disease (peripheral neuropathy)    GI/Hepatic Neg liver ROS, GERD  ,  Endo/Other  diabetes, Type 2Morbid obesity  Renal/GU Renal disease     Musculoskeletal  (+) Arthritis ,   Abdominal   Peds  Hematology  (+) anemia ,   Anesthesia Other Findings   Reproductive/Obstetrics                            Anesthesia Physical Anesthesia Plan  ASA: III  Anesthesia Plan: MAC   Post-op Pain Management:    Induction: Intravenous  Airway Management Planned: Natural Airway and Simple Face Mask  Additional Equipment:   Intra-op Plan:   Post-operative Plan:   Informed Consent: I have reviewed the patients History and Physical, chart, labs and discussed the procedure including the risks, benefits and alternatives for the proposed anesthesia with the patient or authorized representative who has indicated his/her understanding and acceptance.     Plan Discussed with: CRNA and Surgeon  Anesthesia Plan Comments:         Anesthesia Quick Evaluation

## 2016-06-15 NOTE — OR Nursing (Signed)
Specimen time outs by D. Philipp Ovens, ST & Cher Nakai, RN @1320 

## 2016-06-15 NOTE — Transfer of Care (Signed)
Immediate Anesthesia Transfer of Care Note  Patient: [name]  Procedure(s) Performed: Procedure(s): DEBRIDEMENT WOUND LEFT FOOT (Left)  Patient Location: PACU  Anesthesia Type:MAC  Level of Consciousness: awake, alert  and oriented  Airway & Oxygen Therapy: Patient Spontanous Breathing and Patient connected to face mask oxygen  Post-op Assessment: Report given to RN and Post -op Vital signs reviewed and stable  Post vital signs: Reviewed and stable  Last Vitals:  Vitals:   06/15/16 1148  BP: 129/71  Pulse: 65  Resp: 20  Temp: 36.5 C    Last Pain:  Vitals:   06/15/16 1148  PainSc: 6       Patients Stated Pain Goal: 2 ([id number])  Complications: No apparent anesthesia complications

## 2016-06-15 NOTE — Progress Notes (Signed)
Abnormal labs reviewed by dr e fitzgerald no further testing needed

## 2016-06-15 NOTE — Anesthesia Postprocedure Evaluation (Signed)
Anesthesia Post Note  Patient: [name]  Procedure(s) Performed: Procedure(s) (LRB): DEBRIDEMENT WOUND LEFT FOOT WITH GRAFT APPLICATION (Left)  Patient location during evaluation: PACU Anesthesia Type: MAC Level of consciousness: awake and alert Pain management: pain level controlled Vital Signs Assessment: post-procedure vital signs reviewed and stable Respiratory status: spontaneous breathing, nonlabored ventilation, respiratory function stable and patient connected to nasal cannula oxygen Cardiovascular status: stable and blood pressure returned to baseline Anesthetic complications: no    Last Vitals:  Vitals:   06/15/16 1400 06/15/16 1415  BP: (!) 118/56 127/68  Pulse: 63 60  Resp: 18 14  Temp:      Last Pain:  Vitals:   06/15/16 1415  PainSc: 6                  Tiajuana Amass

## 2016-06-15 NOTE — H&P (Signed)
[name] is an 65 y.o. male.    Chief Complaint: Pre-op exam/ H&P  HPI: 65 y/o diabetic male patient with history of chronic left diabetic foot ulceration not improved with local wound care and history of amputation seen this AM bedside in Pre-op area. Reports no issues or consitutional symptoms. Confirms NPO status since midnight.   Past Medical History:  Diagnosis Date  . Arthritis    "elbows & knees" (12/13/2014)  . Asthma   . CKD (chronic kidney disease), stage III   . DIABETIC FOOT ULCER 06/20/2009  . Edema, macular, due to secondary diabetes ([place])   . Erectile dysfunction   . GERD (gastroesophageal reflux disease)   . HEARING LOSS, SENSORINEURAL, BILATERAL 01/03/2007   Seen by ENT Dr. Orpah Greek D. Redmond Baseman 01/03/07  . Hemorrhoids   . History of echocardiogram    a. 04/2008 Echo: EF 50-55%, abnl LV relaxation, mildly dil LA.  Marland Kitchen Hyperlipidemia   . Hypertension   . Morbid obesity ([place])   . Neuropathy, lower extremity   . OSA (obstructive sleep apnea)    uses CPAP nightly  . OSA on CPAP    Nocturnal polysomnogram on 01/21/2010 showed severe obstructive sleep apnea/hypopnea syndrome, AHI 74.1 per hour with non positional events, moderately loud snoring, and oxygen desaturation to a nadir of 78% on room air.  CPAP was successfully titrated to 17 CWP, AHI 1.1 per hour using a large ResMed Mirage Quattro full-face mask with heated humidifier. Bruxism was noted.   . Osteomyelitis of ankle and foot ([place])   . Retinopathy   . Type II diabetes mellitus (HCC)    w/complication NOS, type II    Past Surgical History:  Procedure Laterality Date  . AMPUTATION Left 12/15/2014   Procedure: LEFT SECOND TOE AMPUTATION ;  Surgeon: Dorna Leitz, MD;  Location: [place];  Service: Orthopedics;  Laterality: Left;  . 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 Kathalene Frames. Mayer Camel.    Family History  Problem Relation Age of Onset   . Diabetes Mother     also 2 siblings  . Heart attack Father 72  . Throat cancer Brother    Social History:  reports that he quit smoking about 6 years ago. His smoking use included Cigarettes. He has a 15.00 pack-year smoking history. He has never used smokeless tobacco. He reports that he drinks about 6.6 oz of alcohol per week . He reports that he does not use drugs.  Allergies:  Allergies  Allergen Reactions  . Vancomycin     REACTION: ARF    Medications Prior to Admission  Medication Sig Dispense Refill  . albuterol (PROAIR HFA) 108 (90 Base) MCG/ACT inhaler Inhale 2 puffs into the lungs every 6 (six) hours as needed for wheezing or shortness of breath. 18 g 3  . atorvastatin (LIPITOR) 20 MG tablet TAKE 1 TABLET(20 MG) BY MOUTH DAILY 90 tablet 3  . BESIVANCE 0.6 % SUSP Place 1 drop into both eyes 4 (four) times daily.   12  . Cholecalciferol (VITAMIN D3) 2000 UNITS capsule Take 1 capsule (2,000 Units total) by mouth daily. 90 capsule 0  . ELIQUIS 5 MG TABS tablet TAKE 1 TABLET(5 MG) BY MOUTH TWICE DAILY 180 tablet 0  . enalapril (VASOTEC) 20 MG tablet TAKE 2 TABLETS(40 MG) BY MOUTH DAILY 90 tablet 3  . furosemide (LASIX) 40 MG tablet TAKE 1 TABLET(40 MG) BY MOUTH TWICE DAILY  180 tablet 1  . HYDROcodone-acetaminophen (NORCO/VICODIN) 5-325 MG tablet Take 1 and 1/2 tablets by mouth every 6 hours as needed for pain 140 tablet 0  . Insulin Glargine (TOUJEO SOLOSTAR) 300 UNIT/ML SOPN Inject 60-90 Units into the skin 2 (two) times daily. (Patient taking differently: Inject 70-100 Units into the skin 2 (two) times daily. ) 45 pen 2  . Insulin Pen Needle 31G X 5 MM MISC Use for insulin injection 5 times a day. 150 each 5  . insulin regular human CONCENTRATED (HUMULIN R) 500 UNIT/ML kwikpen Inject 20 units at breakfast, 60 units at lunch and 70 units at dinner. 18 mL 3  . metoprolol succinate (TOPROL-XL) 25 MG 24 hr tablet Take 1 tablet (25 mg total) by mouth daily. 90 tablet 3  . omeprazole  (PRILOSEC) 20 MG capsule TAKE 1 CAPSULE(20 MG) BY MOUTH DAILY 90 capsule 1  . pregabalin (LYRICA) 25 MG capsule Take 1 capsule (25 mg total) by mouth 3 (three) times daily. 90 capsule 2  . promethazine (PHENERGAN) 25 MG tablet TAKE 1/2 TO 1 TABLET BY MOUTH EVERY 6 HOURS AS NEEDED FOR NAUSEA 30 tablet 2  . SIMBRINZA 1-0.2 % SUSP Place 1 drop into both eyes 2 (two) times daily. Take as instructed by Dr. Katy Fitch.    Marland Kitchen VICTOZA 18 MG/3ML SOPN INJECT 1.8 MG ONCE DAILY AT THE SAME TIME 9 mL 3  . ACCU-CHEK AVIVA PLUS test strip USE TO CHECK BLOOD SUGAR FOUR TIMES DAILY BEFORE MEALS AND AT BEDTIME 150 each 3  . ACCU-CHEK FASTCLIX LANCETS MISC Check blood sugar 4 times a day before meals and bedtime dx code 250.02 insulin requiring 102 each 6  . Blood Glucose Monitoring Suppl (ACCU-CHEK AVIVA PLUS) W/DEVICE KIT 1 each by Does not apply route 4 (four) times daily -  with meals and at bedtime. 1 kit 0  . cadexomer iodine (IODOSORB) 0.9 % gel Apply 1 application topically daily as needed for wound care. (Patient not taking: Reported on 05/14/2016) 40 g 0  . LUMIGAN 0.01 % SOLN Place 1 drop into both eyes at bedtime.       No results found for this or any previous visit (from the past 48 hour(s)). No results found.  Review of Systems  Constitutional: Negative for chills and fever.  All other systems reviewed and are negative.   Blood pressure 129/71, pulse 65, temperature 97.7 F (36.5 C), resp. rate 20, height '6\' 6"'$  (1.981 m), weight (!) 371 lb (168.3 kg), SpO2 100 %. Physical Exam  Constitutional: He is oriented to person, place, and time. He appears well-developed and well-nourished.  Musculoskeletal: Normal range of motion.       Left foot: There is deformity.       Feet:  Left foot digital amputation status  Neurological: He is alert and oriented to person, place, and time. A sensory deficit is present.  Skin: Skin is warm and dry.  Left foot plantar full thickness ulceration 2x2.5 cm with  keratotic margins and mild serous drainage. No other acute signs of infection  Psychiatric: He has a normal mood and affect. His speech is normal and behavior is normal. Judgment and thought content normal. Cognition and memory are normal.     Assessment/Plan 65 y/o Diabetic male patient with chronic diabetic left foot ulceration -Patient seen and evaluated -Patient chart and MD H&P and pre-operative notes reviewed -Patient NPO for OR for wound debridement and placement of graft left foot -Site marked and all questions  answered  -Patient to be discharged to home post op with continued home nursing and local wound care and post op meds -Patient to follow up in office in 1 weeks as scheduled  Landis Martins, DPM 06/15/2016, 11:52 AM

## 2016-06-15 NOTE — Brief Op Note (Signed)
06/15/2016  1:52 PM  PATIENT:  [name]  65 y.o. male  PRE-OPERATIVE DIAGNOSIS:  DIABETIC ULCER  POST-OPERATIVE DIAGNOSIS:  Left Diabetic foot ulcer  PROCEDURE:  Procedure(s): DEBRIDEMENT WOUND LEFT FOOT (Left)  SURGEON:  Surgeon(s) and Role:     Landis Martins, DPM - Primary  PHYSICIAN ASSISTANT: None  ASSISTANTS: None  ANESTHESIA:  MAC with local   EBL:  Total I/O In: 600 [I.V.:600] Out: -   BLOOD ADMINISTERED:None  DRAINS: None  LOCAL MEDICATIONS USED: 10cc 1% lidocaine and 0.5% marcaine plain  SPECIMEN:  Source of Specimen:  Soft tissue/wound left foot  DISPOSITION OF SPECIMEN:  Microbiology  COUNTS:  YES  TOURNIQUET:   Total Tourniquet Time Documented: Calf (Left) - 40 minutes Total: Calf (Left) - 40 minutes   DICTATION: .Note written in EPIC  PLAN OF CARE: Discharge to home after PACU  PATIENT DISPOSITION:  PACU - hemodynamically stable.   Delay start of Pharmacological VTE agent (>24hrs) due to surgical blood loss or risk of bleeding: no

## 2016-06-15 NOTE — Telephone Encounter (Addendum)
-----   Message from Landis Martins, [place] sent at 06/15/2016 11:49 AM EST ----- Regarding: Home nursing wound care orders To left foot replace steristrips and adaptic. Do not disturb graft that is sutured in on the bottom of the left foot. Cover adaptic with 4x4, abd, kerlix and ACE wrap 3x per week.  Thanks Dr. Cannon Kettle. 06/15/2016-Copy of 06/15/2016 wound care orders above faxed to [facility]. 06/16/2016-Pt left message stating he uses [place]. 06/17/2016-Informed pt of Dr. Leeanne Rio review of wound culture and 06/17/2016 Cipro orders. Pt states his home health nurse states she doesn't have orders for his wound care and is at his house now. I informed Crystal - [facility] that pt's 06/15/2016 orders were [facility] 06/15/2016. Crystal states they must not be in his chart yet. I read 06/15/2016 orders to [place] states she does not have adaptic and I told her she could use xeroform or vaseline gause to keep the graft covered and moist. 06/26/2016-Crystal - [facility] states pt's sutures are coming out but still in the skin, what to do? I informed Crystal - [facility] that Dr. Cannon Kettle stated leave the sutures alone and she would take care of. Crystal states she redress as orders without disturbing the sutures. 07/14/2016-Angela - [facility] states she recertified pt for 3x week for 9 weeks and continuing Dr. Leeanne Rio wound care orders, please call with confirmation and any new orders. 07/15/2016-Faxed Dr. Leeanne Rio 07/14/2016 [facility] to [facility]. 07/22/2016-Angela - [facility] states pt was seen by Dr. Cannon Kettle yesterday, are there new wound care orders. I reviewed 07/21/2016 Plan and Dr. Cannon Kettle states continue Adaptic to wound site. Orders given to [facility].

## 2016-06-16 ENCOUNTER — Ambulatory Visit: Payer: Medicare Other | Admitting: Sports Medicine

## 2016-06-16 ENCOUNTER — Encounter (HOSPITAL_BASED_OUTPATIENT_CLINIC_OR_DEPARTMENT_OTHER): Payer: Self-pay | Admitting: Sports Medicine

## 2016-06-16 ENCOUNTER — Telehealth: Payer: Self-pay | Admitting: Sports Medicine

## 2016-06-16 MED ORDER — PROMETHAZINE HCL 25 MG PO TABS: 25.0000 mg | ORAL_TABLET | Freq: Three times a day (TID) | ORAL | 2 refills | Status: DC | PRN

## 2016-06-16 MED ORDER — DOCUSATE SODIUM 100 MG PO CAPS: 100.0000 mg | ORAL_CAPSULE | Freq: Every day | ORAL | 0 refills | Status: DC | PRN

## 2016-06-16 NOTE — Telephone Encounter (Signed)
Post op check phone call made to patient. Wife answered stating that husband is sleeping but is doing well with no complaints. I reminded her to continue to encourage her husband to rest, elevated, and limited walking and standing. Home nursing to continue with M/W/F schedule using adaptic over graft site. Patient to follow up with me in office next week. -Dr. Cannon Kettle

## 2016-06-17 DIAGNOSIS — N183 Chronic kidney disease, stage 3 (moderate): Secondary | ICD-10-CM | POA: Diagnosis not present

## 2016-06-17 DIAGNOSIS — E1165 Type 2 diabetes mellitus with hyperglycemia: Secondary | ICD-10-CM | POA: Diagnosis not present

## 2016-06-17 DIAGNOSIS — I129 Hypertensive chronic kidney disease with stage 1 through stage 4 chronic kidney disease, or unspecified chronic kidney disease: Secondary | ICD-10-CM | POA: Diagnosis not present

## 2016-06-17 DIAGNOSIS — Z79891 Long term (current) use of opiate analgesic: Secondary | ICD-10-CM | POA: Diagnosis not present

## 2016-06-17 DIAGNOSIS — E11311 Type 2 diabetes mellitus with unspecified diabetic retinopathy with macular edema: Secondary | ICD-10-CM | POA: Diagnosis not present

## 2016-06-17 DIAGNOSIS — L97421 Non-pressure chronic ulcer of left heel and midfoot limited to breakdown of skin: Secondary | ICD-10-CM | POA: Diagnosis not present

## 2016-06-17 DIAGNOSIS — E1142 Type 2 diabetes mellitus with diabetic polyneuropathy: Secondary | ICD-10-CM | POA: Diagnosis not present

## 2016-06-17 DIAGNOSIS — Z794 Long term (current) use of insulin: Secondary | ICD-10-CM | POA: Diagnosis not present

## 2016-06-17 DIAGNOSIS — E1122 Type 2 diabetes mellitus with diabetic chronic kidney disease: Secondary | ICD-10-CM | POA: Diagnosis not present

## 2016-06-17 DIAGNOSIS — E11621 Type 2 diabetes mellitus with foot ulcer: Secondary | ICD-10-CM | POA: Diagnosis not present

## 2016-06-17 DIAGNOSIS — Z7901 Long term (current) use of anticoagulants: Secondary | ICD-10-CM | POA: Diagnosis not present

## 2016-06-17 LAB — AEROBIC CULTURE  (SUPERFICIAL SPECIMEN)

## 2016-06-17 LAB — AEROBIC CULTURE W GRAM STAIN (SUPERFICIAL SPECIMEN): Gram Stain: NONE SEEN

## 2016-06-17 MED ORDER — CIPROFLOXACIN HCL 500 MG PO TABS: 500.0000 mg | ORAL_TABLET | Freq: Two times a day (BID) | ORAL | 0 refills | Status: DC

## 2016-06-17 NOTE — Telephone Encounter (Signed)
-----   Message from Landis Martins, [place] sent at 06/17/2016  7:58 AM EST ----- Marcy Siren Can you let patient know that the wound culture for surgery came back + for Proteus bacteria. Send to his pharmacy Cipro 500mg  bid x 14 days Thanks Dr. Cannon Kettle

## 2016-06-19 DIAGNOSIS — E1165 Type 2 diabetes mellitus with hyperglycemia: Secondary | ICD-10-CM | POA: Diagnosis not present

## 2016-06-19 DIAGNOSIS — L97421 Non-pressure chronic ulcer of left heel and midfoot limited to breakdown of skin: Secondary | ICD-10-CM | POA: Diagnosis not present

## 2016-06-19 DIAGNOSIS — Z7901 Long term (current) use of anticoagulants: Secondary | ICD-10-CM | POA: Diagnosis not present

## 2016-06-19 DIAGNOSIS — N183 Chronic kidney disease, stage 3 (moderate): Secondary | ICD-10-CM | POA: Diagnosis not present

## 2016-06-19 DIAGNOSIS — Z794 Long term (current) use of insulin: Secondary | ICD-10-CM | POA: Diagnosis not present

## 2016-06-19 DIAGNOSIS — Z79891 Long term (current) use of opiate analgesic: Secondary | ICD-10-CM | POA: Diagnosis not present

## 2016-06-19 DIAGNOSIS — E11621 Type 2 diabetes mellitus with foot ulcer: Secondary | ICD-10-CM | POA: Diagnosis not present

## 2016-06-19 DIAGNOSIS — E11311 Type 2 diabetes mellitus with unspecified diabetic retinopathy with macular edema: Secondary | ICD-10-CM | POA: Diagnosis not present

## 2016-06-19 DIAGNOSIS — E1142 Type 2 diabetes mellitus with diabetic polyneuropathy: Secondary | ICD-10-CM | POA: Diagnosis not present

## 2016-06-19 DIAGNOSIS — E1122 Type 2 diabetes mellitus with diabetic chronic kidney disease: Secondary | ICD-10-CM | POA: Diagnosis not present

## 2016-06-19 DIAGNOSIS — I129 Hypertensive chronic kidney disease with stage 1 through stage 4 chronic kidney disease, or unspecified chronic kidney disease: Secondary | ICD-10-CM | POA: Diagnosis not present

## 2016-06-21 LAB — AEROBIC/ANAEROBIC CULTURE W GRAM STAIN (SURGICAL/DEEP WOUND)

## 2016-06-21 LAB — AEROBIC/ANAEROBIC CULTURE (SURGICAL/DEEP WOUND): GRAM STAIN: NONE SEEN

## 2016-06-22 DIAGNOSIS — Z79891 Long term (current) use of opiate analgesic: Secondary | ICD-10-CM | POA: Diagnosis not present

## 2016-06-22 DIAGNOSIS — I129 Hypertensive chronic kidney disease with stage 1 through stage 4 chronic kidney disease, or unspecified chronic kidney disease: Secondary | ICD-10-CM | POA: Diagnosis not present

## 2016-06-22 DIAGNOSIS — E11311 Type 2 diabetes mellitus with unspecified diabetic retinopathy with macular edema: Secondary | ICD-10-CM | POA: Diagnosis not present

## 2016-06-22 DIAGNOSIS — E11621 Type 2 diabetes mellitus with foot ulcer: Secondary | ICD-10-CM | POA: Diagnosis not present

## 2016-06-22 DIAGNOSIS — Z794 Long term (current) use of insulin: Secondary | ICD-10-CM | POA: Diagnosis not present

## 2016-06-22 DIAGNOSIS — E1165 Type 2 diabetes mellitus with hyperglycemia: Secondary | ICD-10-CM | POA: Diagnosis not present

## 2016-06-22 DIAGNOSIS — E1142 Type 2 diabetes mellitus with diabetic polyneuropathy: Secondary | ICD-10-CM | POA: Diagnosis not present

## 2016-06-22 DIAGNOSIS — Z7901 Long term (current) use of anticoagulants: Secondary | ICD-10-CM | POA: Diagnosis not present

## 2016-06-22 DIAGNOSIS — E1122 Type 2 diabetes mellitus with diabetic chronic kidney disease: Secondary | ICD-10-CM | POA: Diagnosis not present

## 2016-06-22 DIAGNOSIS — N183 Chronic kidney disease, stage 3 (moderate): Secondary | ICD-10-CM | POA: Diagnosis not present

## 2016-06-22 DIAGNOSIS — L97421 Non-pressure chronic ulcer of left heel and midfoot limited to breakdown of skin: Secondary | ICD-10-CM | POA: Diagnosis not present

## 2016-06-23 ENCOUNTER — Telehealth: Payer: Self-pay | Admitting: *Deleted

## 2016-06-23 ENCOUNTER — Ambulatory Visit (INDEPENDENT_AMBULATORY_CARE_PROVIDER_SITE_OTHER): Payer: Medicare Other | Admitting: Sports Medicine

## 2016-06-23 ENCOUNTER — Encounter: Payer: Self-pay | Admitting: Sports Medicine

## 2016-06-23 DIAGNOSIS — E11621 Type 2 diabetes mellitus with foot ulcer: Secondary | ICD-10-CM

## 2016-06-23 DIAGNOSIS — Z89432 Acquired absence of left foot: Secondary | ICD-10-CM

## 2016-06-23 DIAGNOSIS — L97522 Non-pressure chronic ulcer of other part of left foot with fat layer exposed: Secondary | ICD-10-CM

## 2016-06-23 DIAGNOSIS — Z9889 Other specified postprocedural states: Secondary | ICD-10-CM

## 2016-06-23 DIAGNOSIS — E1140 Type 2 diabetes mellitus with diabetic neuropathy, unspecified: Secondary | ICD-10-CM

## 2016-06-23 DIAGNOSIS — IMO0002 Reserved for concepts with insufficient information to code with codable children: Secondary | ICD-10-CM

## 2016-06-23 NOTE — Progress Notes (Signed)
Subjective: [name] is a 65 y.o. male patient seen today in office for POV #1 (DOS 06-15-16), S/P Left wound debridement and placement of stravix allograft. Patient denies pain at surgical site, denies calf pain, denies headache, chest pain, shortness of breath, nausea, vomiting, fever, or chills. Patient states that he is doing well and is taking Cipro with no issues. No other issues noted.   Patient Active Problem List   Diagnosis Date Noted  . Chronic pain syndrome 09/18/2015  . Chronic fatigue 04/16/2015  . Preventative health care 03/12/2015  . Family history of coronary artery disease in father 12/14/2014  . Acute osteomyelitis of toe of left foot (HCC)   . Foot ulcer, left (HCC) 12/13/2014  . Paroxysmal atrial fibrillation, new onset 12/13/2014  . Elevated alkaline phosphatase level 06/11/2014  . Constipation 05/31/2014  . Vitamin D deficiency 11/17/2013  . Frequent falls 06/30/2013  . Mild memory disturbance 07/27/2012  . Anemia 02/10/2012  . Hypertension 11/18/2011  . Obstructive sleep apnea 12/12/2009  . EDEMA 02/13/2009  . GERD 06/27/2008  . Diabetes type 2, uncontrolled (HCC) 12/28/2007  . DIABETIC MACULAR EDEMA 10/07/2007  . Dyslipidemia 08/15/2007  . BACKGROUND DIABETIC RETINOPATHY 08/15/2007  . ERECTILE DYSFUNCTION 05/31/2007  . HEARING LOSS, SENSORINEURAL, BILATERAL 01/03/2007  . Diabetic peripheral neuropathy associated with type 2 diabetes mellitus (HCC) 12/16/2006  . Chronic kidney disease, stage III (moderate) 12/01/2006  . Obesity BMI 46 08/23/2006  . STATUS, OTHER TOE(S) AMPUTATION 05/21/2006    Current Outpatient Prescriptions on File Prior to Visit  Medication Sig Dispense Refill  . ACCU-CHEK AVIVA PLUS test strip USE TO CHECK BLOOD SUGAR FOUR TIMES DAILY BEFORE MEALS AND AT BEDTIME 150 each 3  . ACCU-CHEK FASTCLIX LANCETS MISC Check blood sugar 4 times a day before meals and bedtime dx code 250.02 insulin requiring 102 each 6  . albuterol  (PROAIR HFA) 108 (90 Base) MCG/ACT inhaler Inhale 2 puffs into the lungs every 6 (six) hours as needed for wheezing or shortness of breath. 18 g 3  . atorvastatin (LIPITOR) 20 MG tablet TAKE 1 TABLET(20 MG) BY MOUTH DAILY 90 tablet 3  . BESIVANCE 0.6 % SUSP Place 1 drop into both eyes 4 (four) times daily.   12  . Blood Glucose Monitoring Suppl (ACCU-CHEK AVIVA PLUS) W/DEVICE KIT 1 each by Does not apply route 4 (four) times daily -  with meals and at bedtime. 1 kit 0  . Cholecalciferol (VITAMIN D3) 2000 UNITS capsule Take 1 capsule (2,000 Units total) by mouth daily. 90 capsule 0  . ciprofloxacin (CIPRO) 500 MG tablet Take 1 tablet (500 mg total) by mouth 2 (two) times daily. 28 tablet 0  . docusate sodium (COLACE) 100 MG capsule Take 1 capsule (100 mg total) by mouth daily as needed for mild constipation. 14 capsule 0  . ELIQUIS 5 MG TABS tablet TAKE 1 TABLET(5 MG) BY MOUTH TWICE DAILY 180 tablet 0  . enalapril (VASOTEC) 20 MG tablet TAKE 2 TABLETS(40 MG) BY MOUTH DAILY 90 tablet 3  . furosemide (LASIX) 40 MG tablet TAKE 1 TABLET(40 MG) BY MOUTH TWICE DAILY 180 tablet 1  . HYDROcodone-acetaminophen (NORCO) 7.5-325 MG tablet Take 1 tablet by mouth every 6 (six) hours as needed for moderate pain. 56 tablet 0  . Insulin Glargine (TOUJEO SOLOSTAR) 300 UNIT/ML SOPN Inject 60-90 Units into the skin 2 (two) times daily. (Patient taking differently: Inject 70-100 Units into the skin 2 (two) times daily. ) 45 pen 2  . Insulin  Pen Needle 31G X 5 MM MISC Use for insulin injection 5 times a day. 150 each 5  . insulin regular human CONCENTRATED (HUMULIN R) 500 UNIT/ML kwikpen Inject 20 units at breakfast, 60 units at lunch and 70 units at dinner. 18 mL 3  . LUMIGAN 0.01 % SOLN Place 1 drop into both eyes at bedtime.     . metoprolol succinate (TOPROL-XL) 25 MG 24 hr tablet Take 1 tablet (25 mg total) by mouth daily. 90 tablet 3  . omeprazole (PRILOSEC) 20 MG capsule TAKE 1 CAPSULE(20 MG) BY MOUTH DAILY 90  capsule 1  . pregabalin (LYRICA) 25 MG capsule Take 1 capsule (25 mg total) by mouth 3 (three) times daily. 90 capsule 2  . promethazine (PHENERGAN) 25 MG tablet Take 1 tablet (25 mg total) by mouth every 8 (eight) hours as needed for nausea or vomiting. 30 tablet 2  . SIMBRINZA 1-0.2 % SUSP Place 1 drop into both eyes 2 (two) times daily. Take as instructed by Dr. Katy Fitch.    Marland Kitchen VICTOZA 18 MG/3ML SOPN INJECT 1.8 MG ONCE DAILY AT THE SAME TIME 9 mL 3   No current facility-administered medications on file prior to visit.     Allergies  Allergen Reactions  . Vancomycin     REACTION: ARF    Objective: There were no vitals filed for this visit.  General: No acute distress, AAOx3  Left foot: Sutures intact with no gapping or dehiscence at surgical site, wound graft fully incorporated with granular wound bed measuring 5.5x4cm, mild swelling to left foot, no erythema, no warmth, no drainage, no signs of infection noted, Capillary fill time <3 seconds in all digits remaining, amputation status of 2nd and 3rd toes.  No pain with calf compression.   Assessment and Plan:  Problem List Items Addressed This Visit    None    Visit Diagnoses    Diabetic ulcer of left foot associated with type 2 diabetes mellitus, with fat layer exposed, unspecified part of foot ([place])    -  Primary   Foot amputation status, left ([place])       Type 2 diabetes mellitus with diabetic neuropathy, unspecified long term insulin use status (HCC)       S/P foot surgery, left           -Patient seen and evaluated -Applied adaptic to graft site and dry sterile dressing to surgical site left foot secured with ACE wrap and stockinet  -Advised patient to make sure to keep dressings clean, dry, and intact allowing nursing to change dressings  -Advised patient to continue with post-op shoe on left foot with use of cane -Advised patient to limit activity to necessity  -Advised patient to ice and elevate as necessary  -Continue  with Cipro until completed -Will plan for possible suture removal at next office visit. May consider adding oasis to wound bed. In the meantime, patient to call office if any issues or problems arise.   Landis Martins, DPM

## 2016-06-23 NOTE — Telephone Encounter (Signed)
-----   Message from Aldine Contes, MD sent at 06/20/2016  3:40 PM EST ----- Regarding: appointment Hello, Can we get an appointment in [facility] for Mr. Slominski to follow up on his foot ulcer?  Thank you, Dr. Dareen Piano   ----- Message ----- From: Interface, Lab In [place] Sent: 06/15/2016   3:08 PM To: Aldine Contes, MD

## 2016-06-23 NOTE — Telephone Encounter (Signed)
Thank you :)

## 2016-06-23 NOTE — Telephone Encounter (Signed)
Called pt, he states first day he can come to clinic will be Friday, appt made for Friday 12/15 at 1045

## 2016-06-24 DIAGNOSIS — E11311 Type 2 diabetes mellitus with unspecified diabetic retinopathy with macular edema: Secondary | ICD-10-CM | POA: Diagnosis not present

## 2016-06-24 DIAGNOSIS — Z794 Long term (current) use of insulin: Secondary | ICD-10-CM | POA: Diagnosis not present

## 2016-06-24 DIAGNOSIS — L97421 Non-pressure chronic ulcer of left heel and midfoot limited to breakdown of skin: Secondary | ICD-10-CM | POA: Diagnosis not present

## 2016-06-24 DIAGNOSIS — Z7901 Long term (current) use of anticoagulants: Secondary | ICD-10-CM | POA: Diagnosis not present

## 2016-06-24 DIAGNOSIS — E1165 Type 2 diabetes mellitus with hyperglycemia: Secondary | ICD-10-CM | POA: Diagnosis not present

## 2016-06-24 DIAGNOSIS — E1142 Type 2 diabetes mellitus with diabetic polyneuropathy: Secondary | ICD-10-CM | POA: Diagnosis not present

## 2016-06-24 DIAGNOSIS — Z79891 Long term (current) use of opiate analgesic: Secondary | ICD-10-CM | POA: Diagnosis not present

## 2016-06-24 DIAGNOSIS — I129 Hypertensive chronic kidney disease with stage 1 through stage 4 chronic kidney disease, or unspecified chronic kidney disease: Secondary | ICD-10-CM | POA: Diagnosis not present

## 2016-06-24 DIAGNOSIS — N183 Chronic kidney disease, stage 3 (moderate): Secondary | ICD-10-CM | POA: Diagnosis not present

## 2016-06-24 DIAGNOSIS — E11621 Type 2 diabetes mellitus with foot ulcer: Secondary | ICD-10-CM | POA: Diagnosis not present

## 2016-06-24 DIAGNOSIS — E1122 Type 2 diabetes mellitus with diabetic chronic kidney disease: Secondary | ICD-10-CM | POA: Diagnosis not present

## 2016-06-25 ENCOUNTER — Telehealth: Payer: Self-pay | Admitting: Internal Medicine

## 2016-06-25 ENCOUNTER — Other Ambulatory Visit: Payer: Self-pay | Admitting: Endocrinology

## 2016-06-25 DIAGNOSIS — G4733 Obstructive sleep apnea (adult) (pediatric): Secondary | ICD-10-CM | POA: Diagnosis not present

## 2016-06-25 NOTE — Telephone Encounter (Signed)
APT. REMINDER CALL, LMTCB °

## 2016-06-26 ENCOUNTER — Ambulatory Visit (INDEPENDENT_AMBULATORY_CARE_PROVIDER_SITE_OTHER): Payer: Medicare Other | Admitting: Pulmonary Disease

## 2016-06-26 ENCOUNTER — Ambulatory Visit: Payer: Medicare Other

## 2016-06-26 DIAGNOSIS — Z7901 Long term (current) use of anticoagulants: Secondary | ICD-10-CM | POA: Diagnosis not present

## 2016-06-26 DIAGNOSIS — Z9889 Other specified postprocedural states: Secondary | ICD-10-CM | POA: Diagnosis not present

## 2016-06-26 DIAGNOSIS — E11621 Type 2 diabetes mellitus with foot ulcer: Secondary | ICD-10-CM | POA: Diagnosis not present

## 2016-06-26 DIAGNOSIS — E1165 Type 2 diabetes mellitus with hyperglycemia: Secondary | ICD-10-CM | POA: Diagnosis not present

## 2016-06-26 DIAGNOSIS — N183 Chronic kidney disease, stage 3 (moderate): Secondary | ICD-10-CM | POA: Diagnosis not present

## 2016-06-26 DIAGNOSIS — L97529 Non-pressure chronic ulcer of other part of left foot with unspecified severity: Secondary | ICD-10-CM | POA: Diagnosis not present

## 2016-06-26 DIAGNOSIS — Z79899 Other long term (current) drug therapy: Secondary | ICD-10-CM

## 2016-06-26 DIAGNOSIS — I10 Essential (primary) hypertension: Secondary | ICD-10-CM | POA: Diagnosis not present

## 2016-06-26 DIAGNOSIS — E1142 Type 2 diabetes mellitus with diabetic polyneuropathy: Secondary | ICD-10-CM | POA: Diagnosis not present

## 2016-06-26 DIAGNOSIS — Z87891 Personal history of nicotine dependence: Secondary | ICD-10-CM

## 2016-06-26 DIAGNOSIS — Z794 Long term (current) use of insulin: Secondary | ICD-10-CM | POA: Diagnosis not present

## 2016-06-26 DIAGNOSIS — I129 Hypertensive chronic kidney disease with stage 1 through stage 4 chronic kidney disease, or unspecified chronic kidney disease: Secondary | ICD-10-CM | POA: Diagnosis not present

## 2016-06-26 DIAGNOSIS — E1122 Type 2 diabetes mellitus with diabetic chronic kidney disease: Secondary | ICD-10-CM | POA: Diagnosis not present

## 2016-06-26 DIAGNOSIS — E11311 Type 2 diabetes mellitus with unspecified diabetic retinopathy with macular edema: Secondary | ICD-10-CM | POA: Diagnosis not present

## 2016-06-26 DIAGNOSIS — Z79891 Long term (current) use of opiate analgesic: Secondary | ICD-10-CM | POA: Diagnosis not present

## 2016-06-26 DIAGNOSIS — L97421 Non-pressure chronic ulcer of left heel and midfoot limited to breakdown of skin: Secondary | ICD-10-CM | POA: Diagnosis not present

## 2016-06-26 NOTE — Assessment & Plan Note (Signed)
Assessment: Left foot ulcer s/p debridement on 12/4. He is followed by podiatry. Home health visits three times a week, last seen this morning. Dressing was not removed due to having just been changed this morning. No signs or symptoms of infection.  Plan: Continue follow up with podiatry

## 2016-06-26 NOTE — Patient Instructions (Signed)
Please keep your appointments as scheduled. Let us know if you develop any problems with your foot or any new symptoms.

## 2016-06-26 NOTE — Progress Notes (Signed)
   CC: diabetic foot ulcer follow up  HPI:  [name] is a 65 y.o. man with history as noted below presenting for diabetic foot ulcer follow up.  He underwent debridement of left foot wound with graft placement on 12/4.  Home care nurse who comes three times a week came this morning and redressed the wound. She noted some of the sutures were loose. She felt the site looked good otherwise. He last saw the podiatrist on 12/12. He has an appointment with her on Tuesday with possible suture removal.  Past Medical History:  Diagnosis Date  . Arthritis    "elbows & knees" (12/13/2014)  . Asthma   . CKD (chronic kidney disease), stage III   . DIABETIC FOOT ULCER 06/20/2009  . Edema, macular, due to secondary diabetes ([place])   . Erectile dysfunction   . GERD (gastroesophageal reflux disease)   . HEARING LOSS, SENSORINEURAL, BILATERAL 01/03/2007   Seen by ENT Dr. Orpah Greek D. Redmond Baseman 01/03/07  . Hemorrhoids   . History of echocardiogram    a. 04/2008 Echo: EF 50-55%, abnl LV relaxation, mildly dil LA.  Marland Kitchen Hyperlipidemia   . Hypertension   . Morbid obesity ([employer])   . Neuropathy, lower extremity   . OSA (obstructive sleep apnea)    uses CPAP nightly  . OSA on CPAP    Nocturnal polysomnogram on 01/21/2010 showed severe obstructive sleep apnea/hypopnea syndrome, AHI 74.1 per hour with non positional events, moderately loud snoring, and oxygen desaturation to a nadir of 78% on room air.  CPAP was successfully titrated to 17 CWP, AHI 1.1 per hour using a large ResMed Mirage Quattro full-face mask with heated humidifier. Bruxism was noted.   . Osteomyelitis of ankle and foot ([place])   . Retinopathy   . Type II diabetes mellitus (HCC)    w/complication NOS, type II    Review of Systems:   Denies fevers or chills Denies nausea/vomiting  Physical Exam:  Vitals:   06/26/16 1400  BP: 130/71  Pulse: 81  Temp: 97.5 F (36.4 C)  TempSrc: Oral  SpO2: 100%  Weight: (!) 376 lb 9.6 oz (170.8  kg)  Height: 6\' 6"  (1.981 m)   General Apperance: NAD HEENT: Normocephalic, atraumatic, anicteric sclera Neck: Supple, trachea midline Lungs: Clear to auscultation bilaterally. No wheezes, rhonchi or rales. Breathing comfortably Heart: Regular rate and rhythm, no murmur/rub/gallop Abdomen: Soft, nontender, nondistended, no rebound/guarding Extremities: Warm and well perfused, no edema Skin: left foot wrapped in gauze with ACE wrapped around it. Great toe not hot. No strikethrough.  Neurologic: Alert and interactive. No gross deficits.   Assessment & Plan:   See Encounters Tab for problem based charting.  Patient discussed with Dr. Daryll Drown

## 2016-06-26 NOTE — Telephone Encounter (Signed)
Leave sutures alone. I will take them out on Tuesday as planned -Dr. Cannon Kettle

## 2016-06-26 NOTE — Assessment & Plan Note (Signed)
Assessment: BP remains controlled at 130/71 today.  Plan: Continue enalapril 40mg  daily, Lasix 40mg  BID, metoprolol succinate 25mg  daily

## 2016-06-29 ENCOUNTER — Other Ambulatory Visit: Payer: Self-pay | Admitting: Internal Medicine

## 2016-06-29 DIAGNOSIS — Z794 Long term (current) use of insulin: Secondary | ICD-10-CM | POA: Diagnosis not present

## 2016-06-29 DIAGNOSIS — I129 Hypertensive chronic kidney disease with stage 1 through stage 4 chronic kidney disease, or unspecified chronic kidney disease: Secondary | ICD-10-CM | POA: Diagnosis not present

## 2016-06-29 DIAGNOSIS — N183 Chronic kidney disease, stage 3 (moderate): Secondary | ICD-10-CM | POA: Diagnosis not present

## 2016-06-29 DIAGNOSIS — E11621 Type 2 diabetes mellitus with foot ulcer: Secondary | ICD-10-CM | POA: Diagnosis not present

## 2016-06-29 DIAGNOSIS — Z79891 Long term (current) use of opiate analgesic: Secondary | ICD-10-CM | POA: Diagnosis not present

## 2016-06-29 DIAGNOSIS — L97421 Non-pressure chronic ulcer of left heel and midfoot limited to breakdown of skin: Secondary | ICD-10-CM | POA: Diagnosis not present

## 2016-06-29 DIAGNOSIS — Z7901 Long term (current) use of anticoagulants: Secondary | ICD-10-CM | POA: Diagnosis not present

## 2016-06-29 DIAGNOSIS — E1165 Type 2 diabetes mellitus with hyperglycemia: Secondary | ICD-10-CM | POA: Diagnosis not present

## 2016-06-29 DIAGNOSIS — E1142 Type 2 diabetes mellitus with diabetic polyneuropathy: Secondary | ICD-10-CM | POA: Diagnosis not present

## 2016-06-29 DIAGNOSIS — E11311 Type 2 diabetes mellitus with unspecified diabetic retinopathy with macular edema: Secondary | ICD-10-CM | POA: Diagnosis not present

## 2016-06-29 DIAGNOSIS — E1122 Type 2 diabetes mellitus with diabetic chronic kidney disease: Secondary | ICD-10-CM | POA: Diagnosis not present

## 2016-06-29 NOTE — Progress Notes (Signed)
Internal Medicine Clinic Attending  Case discussed with Dr. Krall soon after the resident saw the patient.  We reviewed the resident's history and exam and pertinent patient test results.  I agree with the assessment, diagnosis, and plan of care documented in the resident's note. 

## 2016-06-29 NOTE — Telephone Encounter (Signed)
Refill Request ° °HYDROcodone-acetaminophen (NORCO) 7.5-325 MG tablet °

## 2016-06-30 ENCOUNTER — Ambulatory Visit (INDEPENDENT_AMBULATORY_CARE_PROVIDER_SITE_OTHER): Payer: Medicare Other | Admitting: Sports Medicine

## 2016-06-30 VITALS — Temp 97.3°F

## 2016-06-30 DIAGNOSIS — E1140 Type 2 diabetes mellitus with diabetic neuropathy, unspecified: Secondary | ICD-10-CM

## 2016-06-30 DIAGNOSIS — Z9889 Other specified postprocedural states: Secondary | ICD-10-CM

## 2016-06-30 DIAGNOSIS — Z89432 Acquired absence of left foot: Secondary | ICD-10-CM

## 2016-06-30 DIAGNOSIS — E11621 Type 2 diabetes mellitus with foot ulcer: Secondary | ICD-10-CM | POA: Diagnosis not present

## 2016-06-30 DIAGNOSIS — L97522 Non-pressure chronic ulcer of other part of left foot with fat layer exposed: Secondary | ICD-10-CM | POA: Diagnosis not present

## 2016-06-30 DIAGNOSIS — IMO0002 Reserved for concepts with insufficient information to code with codable children: Secondary | ICD-10-CM

## 2016-06-30 NOTE — Progress Notes (Addendum)
Subjective: [name] is a 65 y.o. male patient seen today in office for POV #2 (DOS 06-15-16), S/P Left wound debridement and placement of stravix allograft. Patient denies pain at surgical site, denies calf pain, denies headache, chest pain, shortness of breath, nausea, vomiting, fever, or chills. Patient states that he is doing well and is taking Cipro with no issues has a few days left. No other issues noted.   Patient Active Problem List   Diagnosis Date Noted  . Chronic pain syndrome 09/18/2015  . Chronic fatigue 04/16/2015  . Preventative health care 03/12/2015  . Family history of coronary artery disease in father 12/14/2014  . Acute osteomyelitis of toe of left foot ([place])   . Foot ulcer, left ([place]) 12/13/2014  . Paroxysmal atrial fibrillation, new onset 12/13/2014  . Elevated alkaline phosphatase level 06/11/2014  . Constipation 05/31/2014  . Vitamin D deficiency 11/17/2013  . Frequent falls 06/30/2013  . Mild memory disturbance 07/27/2012  . Anemia 02/10/2012  . Hypertension 11/18/2011  . Obstructive sleep apnea 12/12/2009  . EDEMA 02/13/2009  . GERD 06/27/2008  . Diabetes type 2, uncontrolled ([place]) 12/28/2007  . DIABETIC MACULAR EDEMA 10/07/2007  . Dyslipidemia 08/15/2007  . BACKGROUND DIABETIC RETINOPATHY 08/15/2007  . ERECTILE DYSFUNCTION 05/31/2007  . HEARING LOSS, SENSORINEURAL, BILATERAL 01/03/2007  . Diabetic peripheral neuropathy associated with type 2 diabetes mellitus ([place]) 12/16/2006  . Chronic kidney disease, stage III (moderate) 12/01/2006  . Obesity BMI 46 08/23/2006  . STATUS, OTHER TOE(S) AMPUTATION 05/21/2006    Current Outpatient Prescriptions on File Prior to Visit  Medication Sig Dispense Refill  . ACCU-CHEK AVIVA PLUS test strip USE TO CHECK BLOOD SUGAR FOUR TIMES DAILY BEFORE MEALS AND AT BEDTIME 150 each 3  . ACCU-CHEK FASTCLIX LANCETS MISC Check blood sugar 4 times a day before meals and bedtime dx code 250.02 insulin requiring 102 each 6   . albuterol (PROAIR HFA) 108 (90 Base) MCG/ACT inhaler Inhale 2 puffs into the lungs every 6 (six) hours as needed for wheezing or shortness of breath. 18 g 3  . atorvastatin (LIPITOR) 20 MG tablet TAKE 1 TABLET(20 MG) BY MOUTH DAILY 90 tablet 3  . BESIVANCE 0.6 % SUSP Place 1 drop into both eyes 4 (four) times daily.   12  . Blood Glucose Monitoring Suppl (ACCU-CHEK AVIVA PLUS) W/DEVICE KIT 1 each by Does not apply route 4 (four) times daily -  with meals and at bedtime. 1 kit 0  . Cholecalciferol (VITAMIN D3) 2000 UNITS capsule Take 1 capsule (2,000 Units total) by mouth daily. 90 capsule 0  . ciprofloxacin (CIPRO) 500 MG tablet Take 1 tablet (500 mg total) by mouth 2 (two) times daily. 28 tablet 0  . docusate sodium (COLACE) 100 MG capsule Take 1 capsule (100 mg total) by mouth daily as needed for mild constipation. 14 capsule 0  . ELIQUIS 5 MG TABS tablet TAKE 1 TABLET(5 MG) BY MOUTH TWICE DAILY 180 tablet 0  . enalapril (VASOTEC) 20 MG tablet TAKE 2 TABLETS(40 MG) BY MOUTH DAILY 90 tablet 3  . furosemide (LASIX) 40 MG tablet TAKE 1 TABLET(40 MG) BY MOUTH TWICE DAILY 180 tablet 1  . HUMULIN R U-500 KWIKPEN 500 UNIT/ML kwikpen INJECT 20 UNITS AT BREAKFAST, 60 UNITS AT LUNCH, AND 70 UNITS AT DINNER 18 mL 0  . HYDROcodone-acetaminophen (NORCO) 7.5-325 MG tablet Take 1 tablet by mouth every 6 (six) hours as needed for moderate pain. 56 tablet 0  . Insulin Glargine (TOUJEO SOLOSTAR) 300 UNIT/ML  SOPN Inject 60-90 Units into the skin 2 (two) times daily. (Patient taking differently: Inject 70-100 Units into the skin 2 (two) times daily. ) 45 pen 2  . Insulin Pen Needle 31G X 5 MM MISC Use for insulin injection 5 times a day. 150 each 5  . LUMIGAN 0.01 % SOLN Place 1 drop into both eyes at bedtime.     . metoprolol succinate (TOPROL-XL) 25 MG 24 hr tablet Take 1 tablet (25 mg total) by mouth daily. 90 tablet 3  . omeprazole (PRILOSEC) 20 MG capsule TAKE 1 CAPSULE(20 MG) BY MOUTH DAILY 90 capsule 1  .  pregabalin (LYRICA) 25 MG capsule Take 1 capsule (25 mg total) by mouth 3 (three) times daily. 90 capsule 2  . promethazine (PHENERGAN) 25 MG tablet Take 1 tablet (25 mg total) by mouth every 8 (eight) hours as needed for nausea or vomiting. 30 tablet 2  . SIMBRINZA 1-0.2 % SUSP Place 1 drop into both eyes 2 (two) times daily. Take as instructed by Dr. Katy Fitch.    Marland Kitchen VICTOZA 18 MG/3ML SOPN INJECT 1.8 MG ONCE DAILY AT THE SAME TIME 9 mL 3   No current facility-administered medications on file prior to visit.     Allergies  Allergen Reactions  . Vancomycin     REACTION: ARF    Objective: There were no vitals filed for this visit.  General: No acute distress, AAOx3  Left foot: Sutures intact with no gapping or dehiscence at surgical site, wound graft fully incorporated with granular wound bed measuring 5x4x0.2cm smaller than previous, mild swelling to left foot, no erythema, no warmth, no drainage, no acute signs of infection noted, Capillary fill time <3 seconds in all digits remaining, amputation status of 2nd and 3rd toes.  No pain with calf compression.   Assessment and Plan:  Problem List Items Addressed This Visit    None    Visit Diagnoses    Diabetic ulcer of left foot associated with type 2 diabetes mellitus, with fat layer exposed, unspecified part of foot ([place])    -  Primary   Foot amputation status, left ([place])       Type 2 diabetes mellitus with diabetic neuropathy, unspecified long term insulin use status ([facility])       S/P foot surgery, left           -Patient seen and evaluated -Cleansed wound and non-selectively debrided ulceration at left foot with saline moistened guaze then applied Oasis tri-layer 3x7cm wound matrix, lot #[id number], used in its entirety secured with adaptic and steri-strips and dry sterile dressing to surgical site left foot secured with ACE wrap and stockinet  -Advised patient to make sure to keep dressings clean, dry, and intact allowing nursing to  change dressings consisting of adaptic to site  -Advised patient to continue with forefoot offloading post-op shoe on left foot with use of cane -Advised patient to limit activity to necessity  -Advised patient to ice and elevate as necessary  -Continue with Cipro until completed -Will plan for possible re-application of oasis to wound bed at next visit. In the meantime, patient to call office if any issues or problems arise.   Landis Martins, DPM

## 2016-06-30 NOTE — Patient Instructions (Signed)
Wound measures today 5x4x0.2cm

## 2016-07-01 DIAGNOSIS — E11621 Type 2 diabetes mellitus with foot ulcer: Secondary | ICD-10-CM | POA: Diagnosis not present

## 2016-07-01 DIAGNOSIS — N183 Chronic kidney disease, stage 3 (moderate): Secondary | ICD-10-CM | POA: Diagnosis not present

## 2016-07-01 DIAGNOSIS — Z7901 Long term (current) use of anticoagulants: Secondary | ICD-10-CM | POA: Diagnosis not present

## 2016-07-01 DIAGNOSIS — E11311 Type 2 diabetes mellitus with unspecified diabetic retinopathy with macular edema: Secondary | ICD-10-CM | POA: Diagnosis not present

## 2016-07-01 DIAGNOSIS — I129 Hypertensive chronic kidney disease with stage 1 through stage 4 chronic kidney disease, or unspecified chronic kidney disease: Secondary | ICD-10-CM | POA: Diagnosis not present

## 2016-07-01 DIAGNOSIS — E1165 Type 2 diabetes mellitus with hyperglycemia: Secondary | ICD-10-CM | POA: Diagnosis not present

## 2016-07-01 DIAGNOSIS — E1122 Type 2 diabetes mellitus with diabetic chronic kidney disease: Secondary | ICD-10-CM | POA: Diagnosis not present

## 2016-07-01 DIAGNOSIS — E1142 Type 2 diabetes mellitus with diabetic polyneuropathy: Secondary | ICD-10-CM | POA: Diagnosis not present

## 2016-07-01 DIAGNOSIS — Z794 Long term (current) use of insulin: Secondary | ICD-10-CM | POA: Diagnosis not present

## 2016-07-01 DIAGNOSIS — Z79891 Long term (current) use of opiate analgesic: Secondary | ICD-10-CM | POA: Diagnosis not present

## 2016-07-01 DIAGNOSIS — L97421 Non-pressure chronic ulcer of left heel and midfoot limited to breakdown of skin: Secondary | ICD-10-CM | POA: Diagnosis not present

## 2016-07-01 MED ORDER — HYDROCODONE-ACETAMINOPHEN 7.5-325 MG PO TABS: 1.0000 | ORAL_TABLET | Freq: Four times a day (QID) | ORAL | 0 refills | Status: DC | PRN

## 2016-07-01 NOTE — Telephone Encounter (Signed)
Want to know if Rx is ready. Please call pt back.  

## 2016-07-02 ENCOUNTER — Other Ambulatory Visit: Payer: Medicare Other

## 2016-07-02 ENCOUNTER — Other Ambulatory Visit: Payer: Self-pay | Admitting: Internal Medicine

## 2016-07-02 DIAGNOSIS — G894 Chronic pain syndrome: Secondary | ICD-10-CM | POA: Diagnosis not present

## 2016-07-02 MED ORDER — HYDROCODONE-ACETAMINOPHEN 7.5-325 MG PO TABS: 1.0000 | ORAL_TABLET | Freq: Four times a day (QID) | ORAL | 0 refills | Status: DC | PRN

## 2016-07-02 NOTE — Telephone Encounter (Signed)
Pt aware, will pick up rx today-will need to provide urine specimen before getting rx.[name]12/21/20178:43 AM

## 2016-07-04 DIAGNOSIS — I129 Hypertensive chronic kidney disease with stage 1 through stage 4 chronic kidney disease, or unspecified chronic kidney disease: Secondary | ICD-10-CM | POA: Diagnosis not present

## 2016-07-04 DIAGNOSIS — Z794 Long term (current) use of insulin: Secondary | ICD-10-CM | POA: Diagnosis not present

## 2016-07-04 DIAGNOSIS — E1142 Type 2 diabetes mellitus with diabetic polyneuropathy: Secondary | ICD-10-CM | POA: Diagnosis not present

## 2016-07-04 DIAGNOSIS — E11311 Type 2 diabetes mellitus with unspecified diabetic retinopathy with macular edema: Secondary | ICD-10-CM | POA: Diagnosis not present

## 2016-07-04 DIAGNOSIS — E1165 Type 2 diabetes mellitus with hyperglycemia: Secondary | ICD-10-CM | POA: Diagnosis not present

## 2016-07-04 DIAGNOSIS — L97421 Non-pressure chronic ulcer of left heel and midfoot limited to breakdown of skin: Secondary | ICD-10-CM | POA: Diagnosis not present

## 2016-07-04 DIAGNOSIS — E11621 Type 2 diabetes mellitus with foot ulcer: Secondary | ICD-10-CM | POA: Diagnosis not present

## 2016-07-04 DIAGNOSIS — Z79891 Long term (current) use of opiate analgesic: Secondary | ICD-10-CM | POA: Diagnosis not present

## 2016-07-04 DIAGNOSIS — N183 Chronic kidney disease, stage 3 (moderate): Secondary | ICD-10-CM | POA: Diagnosis not present

## 2016-07-04 DIAGNOSIS — Z7901 Long term (current) use of anticoagulants: Secondary | ICD-10-CM | POA: Diagnosis not present

## 2016-07-04 DIAGNOSIS — E1122 Type 2 diabetes mellitus with diabetic chronic kidney disease: Secondary | ICD-10-CM | POA: Diagnosis not present

## 2016-07-07 ENCOUNTER — Ambulatory Visit (INDEPENDENT_AMBULATORY_CARE_PROVIDER_SITE_OTHER): Payer: Self-pay | Admitting: Sports Medicine

## 2016-07-07 DIAGNOSIS — E11621 Type 2 diabetes mellitus with foot ulcer: Secondary | ICD-10-CM

## 2016-07-07 DIAGNOSIS — L97521 Non-pressure chronic ulcer of other part of left foot limited to breakdown of skin: Secondary | ICD-10-CM | POA: Diagnosis not present

## 2016-07-07 DIAGNOSIS — L97522 Non-pressure chronic ulcer of other part of left foot with fat layer exposed: Secondary | ICD-10-CM

## 2016-07-07 NOTE — Progress Notes (Signed)
Pt presents for wound care/ dressing change to left foot.   He denies pain, fever, or chills at this time and has completed all of his prescribed antibiotics.   Wound on plantar forefoot Lt foot, minimal drainage noted, no foul odor, no erythema, warmth, no acute signs of infection noted  Cleansed with saline and gauze. Applied Oasis graft to entire wound bed Lot# [id number], adaptic was applied over graft, sterile gauze applied with kerlix, coflex and stockinete. Advised on s/s of infection and that symptoms should be reported immediatley.Advised to continue with elevation and front offloading. He is to follow up in 1 week with Dr Cannon Kettle

## 2016-07-08 DIAGNOSIS — E11311 Type 2 diabetes mellitus with unspecified diabetic retinopathy with macular edema: Secondary | ICD-10-CM | POA: Diagnosis not present

## 2016-07-08 DIAGNOSIS — N183 Chronic kidney disease, stage 3 (moderate): Secondary | ICD-10-CM | POA: Diagnosis not present

## 2016-07-08 DIAGNOSIS — I129 Hypertensive chronic kidney disease with stage 1 through stage 4 chronic kidney disease, or unspecified chronic kidney disease: Secondary | ICD-10-CM | POA: Diagnosis not present

## 2016-07-08 DIAGNOSIS — E1142 Type 2 diabetes mellitus with diabetic polyneuropathy: Secondary | ICD-10-CM | POA: Diagnosis not present

## 2016-07-08 DIAGNOSIS — E1122 Type 2 diabetes mellitus with diabetic chronic kidney disease: Secondary | ICD-10-CM | POA: Diagnosis not present

## 2016-07-08 DIAGNOSIS — Z79891 Long term (current) use of opiate analgesic: Secondary | ICD-10-CM | POA: Diagnosis not present

## 2016-07-08 DIAGNOSIS — Z794 Long term (current) use of insulin: Secondary | ICD-10-CM | POA: Diagnosis not present

## 2016-07-08 DIAGNOSIS — E11621 Type 2 diabetes mellitus with foot ulcer: Secondary | ICD-10-CM | POA: Diagnosis not present

## 2016-07-08 DIAGNOSIS — E1165 Type 2 diabetes mellitus with hyperglycemia: Secondary | ICD-10-CM | POA: Diagnosis not present

## 2016-07-08 DIAGNOSIS — L97421 Non-pressure chronic ulcer of left heel and midfoot limited to breakdown of skin: Secondary | ICD-10-CM | POA: Diagnosis not present

## 2016-07-08 DIAGNOSIS — Z7901 Long term (current) use of anticoagulants: Secondary | ICD-10-CM | POA: Diagnosis not present

## 2016-07-09 ENCOUNTER — Other Ambulatory Visit: Payer: Self-pay | Admitting: Internal Medicine

## 2016-07-10 DIAGNOSIS — Z794 Long term (current) use of insulin: Secondary | ICD-10-CM | POA: Diagnosis not present

## 2016-07-10 DIAGNOSIS — I129 Hypertensive chronic kidney disease with stage 1 through stage 4 chronic kidney disease, or unspecified chronic kidney disease: Secondary | ICD-10-CM | POA: Diagnosis not present

## 2016-07-10 DIAGNOSIS — E11621 Type 2 diabetes mellitus with foot ulcer: Secondary | ICD-10-CM | POA: Diagnosis not present

## 2016-07-10 DIAGNOSIS — E1142 Type 2 diabetes mellitus with diabetic polyneuropathy: Secondary | ICD-10-CM | POA: Diagnosis not present

## 2016-07-10 DIAGNOSIS — L97421 Non-pressure chronic ulcer of left heel and midfoot limited to breakdown of skin: Secondary | ICD-10-CM | POA: Diagnosis not present

## 2016-07-10 DIAGNOSIS — Z79891 Long term (current) use of opiate analgesic: Secondary | ICD-10-CM | POA: Diagnosis not present

## 2016-07-10 DIAGNOSIS — E1165 Type 2 diabetes mellitus with hyperglycemia: Secondary | ICD-10-CM | POA: Diagnosis not present

## 2016-07-10 DIAGNOSIS — E11311 Type 2 diabetes mellitus with unspecified diabetic retinopathy with macular edema: Secondary | ICD-10-CM | POA: Diagnosis not present

## 2016-07-10 DIAGNOSIS — N183 Chronic kidney disease, stage 3 (moderate): Secondary | ICD-10-CM | POA: Diagnosis not present

## 2016-07-10 DIAGNOSIS — Z7901 Long term (current) use of anticoagulants: Secondary | ICD-10-CM | POA: Diagnosis not present

## 2016-07-10 DIAGNOSIS — E1122 Type 2 diabetes mellitus with diabetic chronic kidney disease: Secondary | ICD-10-CM | POA: Diagnosis not present

## 2016-07-11 LAB — TOXASSURE SELECT,+ANTIDEPR,UR

## 2016-07-13 DIAGNOSIS — E11311 Type 2 diabetes mellitus with unspecified diabetic retinopathy with macular edema: Secondary | ICD-10-CM | POA: Diagnosis not present

## 2016-07-13 DIAGNOSIS — Z4800 Encounter for change or removal of nonsurgical wound dressing: Secondary | ICD-10-CM | POA: Diagnosis not present

## 2016-07-13 DIAGNOSIS — E11621 Type 2 diabetes mellitus with foot ulcer: Secondary | ICD-10-CM | POA: Diagnosis not present

## 2016-07-13 DIAGNOSIS — N183 Chronic kidney disease, stage 3 (moderate): Secondary | ICD-10-CM | POA: Diagnosis not present

## 2016-07-13 DIAGNOSIS — E1122 Type 2 diabetes mellitus with diabetic chronic kidney disease: Secondary | ICD-10-CM | POA: Diagnosis not present

## 2016-07-13 DIAGNOSIS — E1142 Type 2 diabetes mellitus with diabetic polyneuropathy: Secondary | ICD-10-CM | POA: Diagnosis not present

## 2016-07-13 DIAGNOSIS — Z794 Long term (current) use of insulin: Secondary | ICD-10-CM | POA: Diagnosis not present

## 2016-07-13 DIAGNOSIS — Z7901 Long term (current) use of anticoagulants: Secondary | ICD-10-CM | POA: Diagnosis not present

## 2016-07-13 DIAGNOSIS — I129 Hypertensive chronic kidney disease with stage 1 through stage 4 chronic kidney disease, or unspecified chronic kidney disease: Secondary | ICD-10-CM | POA: Diagnosis not present

## 2016-07-13 DIAGNOSIS — L97522 Non-pressure chronic ulcer of other part of left foot with fat layer exposed: Secondary | ICD-10-CM | POA: Diagnosis not present

## 2016-07-13 DIAGNOSIS — Z79891 Long term (current) use of opiate analgesic: Secondary | ICD-10-CM | POA: Diagnosis not present

## 2016-07-14 ENCOUNTER — Encounter: Payer: Self-pay | Admitting: Sports Medicine

## 2016-07-14 ENCOUNTER — Ambulatory Visit (INDEPENDENT_AMBULATORY_CARE_PROVIDER_SITE_OTHER): Payer: Medicare Other | Admitting: Sports Medicine

## 2016-07-14 DIAGNOSIS — E11621 Type 2 diabetes mellitus with foot ulcer: Secondary | ICD-10-CM

## 2016-07-14 DIAGNOSIS — IMO0002 Reserved for concepts with insufficient information to code with codable children: Secondary | ICD-10-CM

## 2016-07-14 DIAGNOSIS — Z9889 Other specified postprocedural states: Secondary | ICD-10-CM

## 2016-07-14 DIAGNOSIS — Z89432 Acquired absence of left foot: Secondary | ICD-10-CM

## 2016-07-14 DIAGNOSIS — L97522 Non-pressure chronic ulcer of other part of left foot with fat layer exposed: Secondary | ICD-10-CM | POA: Diagnosis not present

## 2016-07-14 DIAGNOSIS — E1140 Type 2 diabetes mellitus with diabetic neuropathy, unspecified: Secondary | ICD-10-CM

## 2016-07-14 NOTE — Telephone Encounter (Signed)
Continue with adaptic secured with steristrips and dry dressing to foot ulcer -Dr. Cannon Kettle

## 2016-07-14 NOTE — Progress Notes (Signed)
Subjective: [name] is a 66 y.o. male patient seen today in office for POV #4 (DOS 06-15-16), S/P Left wound debridement and placement of stravix allograft. Patient denies pain at surgical site, denies calf pain, denies headache, chest pain, shortness of breath, nausea, vomiting, fever, or chills. Patient states that he is doing well and is finished Cipro on 07-02-16 with no issues. Has home nursing. No other issues noted.   Patient Active Problem List   Diagnosis Date Noted  . Chronic pain syndrome 09/18/2015  . Chronic fatigue 04/16/2015  . Preventative health care 03/12/2015  . Family history of coronary artery disease in father 12/14/2014  . Acute osteomyelitis of toe of left foot ([place])   . Foot ulcer, left ([place]) 12/13/2014  . Paroxysmal atrial fibrillation, new onset 12/13/2014  . Elevated alkaline phosphatase level 06/11/2014  . Constipation 05/31/2014  . Vitamin D deficiency 11/17/2013  . Frequent falls 06/30/2013  . Mild memory disturbance 07/27/2012  . Anemia 02/10/2012  . Hypertension 11/18/2011  . Wheezing 06/08/2011  . Obstructive sleep apnea 12/12/2009  . EDEMA 02/13/2009  . GERD 06/27/2008  . Diabetes type 2, uncontrolled ([place]) 12/28/2007  . DIABETIC MACULAR EDEMA 10/07/2007  . Dyslipidemia 08/15/2007  . BACKGROUND DIABETIC RETINOPATHY 08/15/2007  . ERECTILE DYSFUNCTION 05/31/2007  . HEARING LOSS, SENSORINEURAL, BILATERAL 01/03/2007  . Diabetic peripheral neuropathy associated with type 2 diabetes mellitus ([place]) 12/16/2006  . Chronic kidney disease, stage III (moderate) 12/01/2006  . Obesity BMI 46 08/23/2006  . STATUS, OTHER TOE(S) AMPUTATION 05/21/2006    Current Outpatient Prescriptions on File Prior to Visit  Medication Sig Dispense Refill  . ACCU-CHEK AVIVA PLUS test strip USE TO CHECK BLOOD SUGAR FOUR TIMES DAILY BEFORE MEALS AND AT BEDTIME 150 each 3  . ACCU-CHEK FASTCLIX LANCETS MISC Check blood sugar 4 times a day before meals and bedtime dx code  250.02 insulin requiring 102 each 6  . atorvastatin (LIPITOR) 20 MG tablet TAKE 1 TABLET(20 MG) BY MOUTH DAILY 90 tablet 3  . BESIVANCE 0.6 % SUSP Place 1 drop into both eyes 4 (four) times daily.   12  . Blood Glucose Monitoring Suppl (ACCU-CHEK AVIVA PLUS) W/DEVICE KIT 1 each by Does not apply route 4 (four) times daily -  with meals and at bedtime. 1 kit 0  . Cholecalciferol (VITAMIN D3) 2000 UNITS capsule Take 1 capsule (2,000 Units total) by mouth daily. 90 capsule 0  . ciprofloxacin (CIPRO) 500 MG tablet Take 1 tablet (500 mg total) by mouth 2 (two) times daily. 28 tablet 0  . docusate sodium (COLACE) 100 MG capsule Take 1 capsule (100 mg total) by mouth daily as needed for mild constipation. 14 capsule 0  . ELIQUIS 5 MG TABS tablet TAKE 1 TABLET(5 MG) BY MOUTH TWICE DAILY 180 tablet 0  . enalapril (VASOTEC) 20 MG tablet TAKE 2 TABLETS(40 MG) BY MOUTH DAILY 90 tablet 3  . furosemide (LASIX) 40 MG tablet TAKE 1 TABLET(40 MG) BY MOUTH TWICE DAILY 180 tablet 1  . HUMULIN R U-500 KWIKPEN 500 UNIT/ML kwikpen INJECT 20 UNITS AT BREAKFAST, 60 UNITS AT LUNCH, AND 70 UNITS AT DINNER 18 mL 0  . HYDROcodone-acetaminophen (NORCO) 7.5-325 MG tablet Take 1 tablet by mouth every 6 (six) hours as needed for moderate pain. 84 tablet 0  . Insulin Glargine (TOUJEO SOLOSTAR) 300 UNIT/ML SOPN Inject 60-90 Units into the skin 2 (two) times daily. (Patient taking differently: Inject 70-100 Units into the skin 2 (two) times daily. ) 45 pen  2  . Insulin Pen Needle 31G X 5 MM MISC Use for insulin injection 5 times a day. 150 each 5  . LUMIGAN 0.01 % SOLN Place 1 drop into both eyes at bedtime.     . metoprolol succinate (TOPROL-XL) 25 MG 24 hr tablet Take 1 tablet (25 mg total) by mouth daily. 90 tablet 3  . omeprazole (PRILOSEC) 20 MG capsule TAKE 1 CAPSULE(20 MG) BY MOUTH DAILY 90 capsule 1  . pregabalin (LYRICA) 25 MG capsule Take 1 capsule (25 mg total) by mouth 3 (three) times daily. 90 capsule 2  . PROAIR HFA  108 (90 Base) MCG/ACT inhaler INHALE 2 PUFFS INTO THE LUNGS BY MOUTH EVERY 6 HOURS AS NEEDED FOR WHEEZING OR SHORTNESS OF BREATH 8.5 g 0  . promethazine (PHENERGAN) 25 MG tablet Take 1 tablet (25 mg total) by mouth every 8 (eight) hours as needed for nausea or vomiting. 30 tablet 2  . SIMBRINZA 1-0.2 % SUSP Place 1 drop into both eyes 2 (two) times daily. Take as instructed by Dr. Katy Fitch.    Marland Kitchen VICTOZA 18 MG/3ML SOPN INJECT 1.8 MG ONCE DAILY AT THE SAME TIME 9 mL 3   No current facility-administered medications on file prior to visit.     Allergies  Allergen Reactions  . Vancomycin     REACTION: ARF    Objective: There were no vitals filed for this visit.  General: No acute distress, AAOx3  Left foot: Plantar forefoot, wound graft fully incorporated with granular wound bed measuring 4.5x4x0.2cm smaller than previous, mild swelling to left foot, no erythema, no warmth, no drainage, no acute signs of infection noted, Capillary fill time <3 seconds in all digits remaining, amputation status of 2nd and 3rd toes. No pain with calf compression.   Assessment and Plan:  Problem List Items Addressed This Visit    None    Visit Diagnoses    Diabetic ulcer of left foot associated with type 2 diabetes mellitus, with fat layer exposed, unspecified part of foot ([place])    -  Primary   Foot amputation status, left ([place])       Type 2 diabetes mellitus with diabetic neuropathy, unspecified long term insulin use status ([facility])       S/P foot surgery, left           -Patient seen and evaluated -Cleansed wound and debrided ulceration using sterile tissue nipper at left foot then applied prisma secured and dry sterile dressing to surgical site left foot secured with ACE wrap and stockinet  -Advised patient to make sure to keep dressings clean, dry, and intact allowing nursing to change dressings consisting of adaptic to site  -Advised patient to continue with forefoot offloading post-op shoe on left foot  with use of cane -Advised patient to limit activity to necessity  -Advised patient to ice and elevate as necessary  -Will plan for possible re-application of oasis to wound bed at next visit when it is back in stock. In the meantime, patient to call office if any issues or problems arise.   Landis Martins, DPM

## 2016-07-15 DIAGNOSIS — E1142 Type 2 diabetes mellitus with diabetic polyneuropathy: Secondary | ICD-10-CM | POA: Diagnosis not present

## 2016-07-15 DIAGNOSIS — E1122 Type 2 diabetes mellitus with diabetic chronic kidney disease: Secondary | ICD-10-CM | POA: Diagnosis not present

## 2016-07-15 DIAGNOSIS — Z4800 Encounter for change or removal of nonsurgical wound dressing: Secondary | ICD-10-CM | POA: Diagnosis not present

## 2016-07-15 DIAGNOSIS — N183 Chronic kidney disease, stage 3 (moderate): Secondary | ICD-10-CM | POA: Diagnosis not present

## 2016-07-15 DIAGNOSIS — Z794 Long term (current) use of insulin: Secondary | ICD-10-CM | POA: Diagnosis not present

## 2016-07-15 DIAGNOSIS — Z79891 Long term (current) use of opiate analgesic: Secondary | ICD-10-CM | POA: Diagnosis not present

## 2016-07-15 DIAGNOSIS — Z7901 Long term (current) use of anticoagulants: Secondary | ICD-10-CM | POA: Diagnosis not present

## 2016-07-15 DIAGNOSIS — E11311 Type 2 diabetes mellitus with unspecified diabetic retinopathy with macular edema: Secondary | ICD-10-CM | POA: Diagnosis not present

## 2016-07-15 DIAGNOSIS — E11621 Type 2 diabetes mellitus with foot ulcer: Secondary | ICD-10-CM | POA: Diagnosis not present

## 2016-07-15 DIAGNOSIS — L97522 Non-pressure chronic ulcer of other part of left foot with fat layer exposed: Secondary | ICD-10-CM | POA: Diagnosis not present

## 2016-07-15 DIAGNOSIS — I129 Hypertensive chronic kidney disease with stage 1 through stage 4 chronic kidney disease, or unspecified chronic kidney disease: Secondary | ICD-10-CM | POA: Diagnosis not present

## 2016-07-16 ENCOUNTER — Other Ambulatory Visit (INDEPENDENT_AMBULATORY_CARE_PROVIDER_SITE_OTHER): Payer: Medicare Other

## 2016-07-16 DIAGNOSIS — E1165 Type 2 diabetes mellitus with hyperglycemia: Secondary | ICD-10-CM

## 2016-07-16 DIAGNOSIS — Z794 Long term (current) use of insulin: Secondary | ICD-10-CM

## 2016-07-16 LAB — BASIC METABOLIC PANEL
BUN: 18 mg/dL (ref 6–23)
CALCIUM: 8.7 mg/dL (ref 8.4–10.5)
CO2: 31 mEq/L (ref 19–32)
CREATININE: 1.38 mg/dL (ref 0.40–1.50)
Chloride: 109 mEq/L (ref 96–112)
GFR: 66.22 mL/min (ref >60.00)
Glucose, Bld: 119 mg/dL — ABNORMAL HIGH (ref 70–99)
Potassium: 3.7 mEq/L (ref 3.5–5.1)
Sodium: 143 mEq/L (ref 135–145)

## 2016-07-16 LAB — HEMOGLOBIN A1C: HEMOGLOBIN A1C: 9.7 % — AB (ref 4.6–6.5)

## 2016-07-17 DIAGNOSIS — I129 Hypertensive chronic kidney disease with stage 1 through stage 4 chronic kidney disease, or unspecified chronic kidney disease: Secondary | ICD-10-CM | POA: Diagnosis not present

## 2016-07-17 DIAGNOSIS — Z794 Long term (current) use of insulin: Secondary | ICD-10-CM | POA: Diagnosis not present

## 2016-07-17 DIAGNOSIS — E1142 Type 2 diabetes mellitus with diabetic polyneuropathy: Secondary | ICD-10-CM | POA: Diagnosis not present

## 2016-07-17 DIAGNOSIS — E11311 Type 2 diabetes mellitus with unspecified diabetic retinopathy with macular edema: Secondary | ICD-10-CM | POA: Diagnosis not present

## 2016-07-17 DIAGNOSIS — Z79891 Long term (current) use of opiate analgesic: Secondary | ICD-10-CM | POA: Diagnosis not present

## 2016-07-17 DIAGNOSIS — N183 Chronic kidney disease, stage 3 (moderate): Secondary | ICD-10-CM | POA: Diagnosis not present

## 2016-07-17 DIAGNOSIS — L97522 Non-pressure chronic ulcer of other part of left foot with fat layer exposed: Secondary | ICD-10-CM | POA: Diagnosis not present

## 2016-07-17 DIAGNOSIS — E1122 Type 2 diabetes mellitus with diabetic chronic kidney disease: Secondary | ICD-10-CM | POA: Diagnosis not present

## 2016-07-17 DIAGNOSIS — G4733 Obstructive sleep apnea (adult) (pediatric): Secondary | ICD-10-CM | POA: Diagnosis not present

## 2016-07-17 DIAGNOSIS — E11621 Type 2 diabetes mellitus with foot ulcer: Secondary | ICD-10-CM | POA: Diagnosis not present

## 2016-07-17 DIAGNOSIS — Z7901 Long term (current) use of anticoagulants: Secondary | ICD-10-CM | POA: Diagnosis not present

## 2016-07-17 DIAGNOSIS — Z4800 Encounter for change or removal of nonsurgical wound dressing: Secondary | ICD-10-CM | POA: Diagnosis not present

## 2016-07-20 ENCOUNTER — Ambulatory Visit (INDEPENDENT_AMBULATORY_CARE_PROVIDER_SITE_OTHER): Payer: Medicare Other | Admitting: Endocrinology

## 2016-07-20 ENCOUNTER — Encounter: Payer: Self-pay | Admitting: Endocrinology

## 2016-07-20 VITALS — BP 144/80 | HR 70 | Ht 78.0 in | Wt 379.0 lb

## 2016-07-20 DIAGNOSIS — E11621 Type 2 diabetes mellitus with foot ulcer: Secondary | ICD-10-CM | POA: Diagnosis not present

## 2016-07-20 DIAGNOSIS — Z7901 Long term (current) use of anticoagulants: Secondary | ICD-10-CM | POA: Diagnosis not present

## 2016-07-20 DIAGNOSIS — Z4800 Encounter for change or removal of nonsurgical wound dressing: Secondary | ICD-10-CM | POA: Diagnosis not present

## 2016-07-20 DIAGNOSIS — N183 Chronic kidney disease, stage 3 (moderate): Secondary | ICD-10-CM | POA: Diagnosis not present

## 2016-07-20 DIAGNOSIS — L97522 Non-pressure chronic ulcer of other part of left foot with fat layer exposed: Secondary | ICD-10-CM | POA: Diagnosis not present

## 2016-07-20 DIAGNOSIS — E1122 Type 2 diabetes mellitus with diabetic chronic kidney disease: Secondary | ICD-10-CM | POA: Diagnosis not present

## 2016-07-20 DIAGNOSIS — E1165 Type 2 diabetes mellitus with hyperglycemia: Secondary | ICD-10-CM | POA: Diagnosis not present

## 2016-07-20 DIAGNOSIS — Z794 Long term (current) use of insulin: Secondary | ICD-10-CM

## 2016-07-20 DIAGNOSIS — E1142 Type 2 diabetes mellitus with diabetic polyneuropathy: Secondary | ICD-10-CM | POA: Diagnosis not present

## 2016-07-20 DIAGNOSIS — Z79891 Long term (current) use of opiate analgesic: Secondary | ICD-10-CM | POA: Diagnosis not present

## 2016-07-20 DIAGNOSIS — E11311 Type 2 diabetes mellitus with unspecified diabetic retinopathy with macular edema: Secondary | ICD-10-CM | POA: Diagnosis not present

## 2016-07-20 DIAGNOSIS — I129 Hypertensive chronic kidney disease with stage 1 through stage 4 chronic kidney disease, or unspecified chronic kidney disease: Secondary | ICD-10-CM | POA: Diagnosis not present

## 2016-07-20 NOTE — Patient Instructions (Addendum)
TOUJEO: Take 70 units in the morning and 70 in the evening  Humulin U-500: Take 25 in the morning based on blood sugar level  LUNCHTIME take at least 60 units, take larger amounts when eating a bigger meal  SUPPERTIME: Take 80 units consistently regardless of blood sugar before eating to keep blood sugar from going up over 180 after eating  More sugars at bedtime

## 2016-07-20 NOTE — Progress Notes (Signed)
Patient ID: [name], male   DOB: Jul 01, 1951, 66 y.o.   MRN: [id number]           Reason for Appointment: Follow-up for Type 2 Diabetes  Referring physician: Dareen Piano  History of Present Illness:          Date of diagnosis of type 2 diabetes mellitus: 2007       Background history:   He was diagnosed to have diabetes when he had an ulcer on his left third toe which led to amputation.  His glucose was apparently about 400 and he was started on insulin at that time He does not know if he has taken any oral hypoglycemic drugs in the past His A1c in 2016 has been consistently over 8%  Recent history:   INSULIN regimen is:  TOUJEO  70 in the morning and 80 in the evening HUMULIN U-500 insulin: 20 units in the morning, 60/70 at lunch and 70 at supper  Non-insulin hypoglycemic drugs the patient is taking are: Victoza 1.8 mg daily       On his initial consultation was started on Victoza and subsequently started on U-500 insulin in 2/17 along with Toujeo  Blood sugars have been difficult to control and A1c was last still high at 9.1, previously 10%  Current blood sugar patterns and problems identified:  FASTING blood sugars are on an average high but showing some variability and rarely may have a low sugar either during the night or waking up.  He checks his blood sugars usually late morning  His blood sugars are lower than expected for his A1c but he is not checking readings AFTER evening meal usually, has only 2 readings which are high  Most of his blood sugar testing his late afternoon and before supper time and these are quite variable  May occasionally forget to take his insulin before eating  He has not increased his suppertime Humulin insulin as directed on the last visit He has seen the dietitian in the past but not clear if he is consistently following the meal plan  Compliance with the medical regimen: Fair  Hypoglycemia: Once woke up with a low sugar and also  once low sugar during the night  Glucose monitoring:  done up to 3  times a day         Glucometer:  Accu-Chek     Blood Glucose readings by :  Mean values apply above for all meters except median for One Touch  PRE-MEAL Fasting Lunch Dinner Bedtime Overall  Glucose range: 56-192  111-264  111-295  206, 221    Mean/median: 166   198   174     Self-care:  Typical meal intake: Breakfast is sometimes fruit like a banana, Sometimes eating eggs Usually trying to avoid high-fat foods or fried food  Dinner at 6 pm He drinks grapefruit or V-8 juice and diet drinks                Dietician consultation: 12/16               Exercise: not walking, Still has nonhealing foot ulcers or other issues  Weight history: Previous range 260-410  Wt Readings from Last 3 Encounters:  07/20/16 (!) 379 lb (171.9 kg)  06/26/16 (!) 376 lb 9.6 oz (170.8 kg)  06/15/16 (!) 371 lb (168.3 kg)    Glycemic control:   Lab Results  Component Value Date   HGBA1C 9.7 (H) 07/16/2016   HGBA1C 9.1 (  H) 04/13/2016   HGBA1C 10.0 (H) 12/12/2015   Lab Results  Component Value Date   MICROALBUR 14.9 (H) 04/13/2016   LDLCALC 53 08/09/2015   CREATININE 1.38 07/16/2016   Lab on 07/16/2016  Component Date Value Ref Range Status  . Hgb A1c MFr Bld 07/16/2016 9.7* 4.6 - 6.5 % Final  . Sodium 07/16/2016 143  135 - 145 mEq/L Final  . Potassium 07/16/2016 3.7  3.5 - 5.1 mEq/L Final  . Chloride 07/16/2016 109  96 - 112 mEq/L Final  . CO2 07/16/2016 31  19 - 32 mEq/L Final  . Glucose, Bld 07/16/2016 119* 70 - 99 mg/dL Final  . BUN 07/16/2016 18  6 - 23 mg/dL Final  . Creatinine, Ser 07/16/2016 1.38  0.40 - 1.50 mg/dL Final  . Calcium 07/16/2016 8.7  8.4 - 10.5 mg/dL Final  . GFR 07/16/2016 66.22  >60.00 mL/min Final       Allergies as of 07/20/2016      Reactions   Vancomycin    REACTION: ARF      Medication List       Accurate as of 07/20/16  1:48 PM. Always use your most recent med list.            ACCU-CHEK AVIVA PLUS test strip Generic drug:  glucose blood USE TO CHECK BLOOD SUGAR FOUR TIMES DAILY BEFORE MEALS AND AT BEDTIME   ACCU-CHEK AVIVA PLUS w/Device Kit 1 each by Does not apply route 4 (four) times daily -  with meals and at bedtime.   ACCU-CHEK FASTCLIX LANCETS Misc Check blood sugar 4 times a day before meals and bedtime dx code 250.02 insulin requiring   atorvastatin 20 MG tablet Commonly known as:  LIPITOR TAKE 1 TABLET(20 MG) BY MOUTH DAILY   BESIVANCE 0.6 % Susp Generic drug:  Besifloxacin HCl Place 1 drop into both eyes 4 (four) times daily.   ciprofloxacin 500 MG tablet Commonly known as:  CIPRO Take 1 tablet (500 mg total) by mouth 2 (two) times daily.   docusate sodium 100 MG capsule Commonly known as:  COLACE Take 1 capsule (100 mg total) by mouth daily as needed for mild constipation.   ELIQUIS 5 MG Tabs tablet Generic drug:  apixaban TAKE 1 TABLET(5 MG) BY MOUTH TWICE DAILY   enalapril 20 MG tablet Commonly known as:  VASOTEC TAKE 2 TABLETS(40 MG) BY MOUTH DAILY   furosemide 40 MG tablet Commonly known as:  LASIX TAKE 1 TABLET(40 MG) BY MOUTH TWICE DAILY   HUMULIN R U-500 KWIKPEN 500 UNIT/ML kwikpen Generic drug:  insulin regular human CONCENTRATED INJECT 20 UNITS AT BREAKFAST, 60 UNITS AT LUNCH, AND 70 UNITS AT DINNER   HYDROcodone-acetaminophen 7.5-325 MG tablet Commonly known as:  NORCO Take 1 tablet by mouth every 6 (six) hours as needed for moderate pain.   Insulin Glargine 300 UNIT/ML Sopn Commonly known as:  TOUJEO SOLOSTAR Inject 60-90 Units into the skin 2 (two) times daily.   Insulin Pen Needle 31G X 5 MM Misc Use for insulin injection 5 times a day.   LUMIGAN 0.01 % Soln Generic drug:  bimatoprost Place 1 drop into both eyes at bedtime.   metoprolol succinate 25 MG 24 hr tablet Commonly known as:  TOPROL-XL Take 1 tablet (25 mg total) by mouth daily.   omeprazole 20 MG capsule Commonly known as:  PRILOSEC TAKE 1  CAPSULE(20 MG) BY MOUTH DAILY   pregabalin 25 MG capsule Commonly known as:  LYRICA Take 1  capsule (25 mg total) by mouth 3 (three) times daily.   PROAIR HFA 108 (90 Base) MCG/ACT inhaler Generic drug:  albuterol INHALE 2 PUFFS INTO THE LUNGS BY MOUTH EVERY 6 HOURS AS NEEDED FOR WHEEZING OR SHORTNESS OF BREATH   promethazine 25 MG tablet Commonly known as:  PHENERGAN Take 1 tablet (25 mg total) by mouth every 8 (eight) hours as needed for nausea or vomiting.   SIMBRINZA 1-0.2 % Susp Generic drug:  Brinzolamide-Brimonidine Place 1 drop into both eyes 2 (two) times daily. Take as instructed by Dr. Katy Fitch.   VICTOZA 18 MG/3ML Sopn Generic drug:  liraglutide INJECT 1.8 MG ONCE DAILY AT THE SAME TIME   Vitamin D3 2000 units capsule Take 1 capsule (2,000 Units total) by mouth daily.       Allergies:  Allergies  Allergen Reactions  . Vancomycin     REACTION: ARF    Past Medical History:  Diagnosis Date  . Arthritis    "elbows & knees" (12/13/2014)  . Asthma   . CKD (chronic kidney disease), stage III   . DIABETIC FOOT ULCER 06/20/2009  . Edema, macular, due to secondary diabetes ([place])   . Erectile dysfunction   . GERD (gastroesophageal reflux disease)   . HEARING LOSS, SENSORINEURAL, BILATERAL 01/03/2007   Seen by ENT Dr. Orpah Greek D. Redmond Baseman 01/03/07  . Hemorrhoids   . History of echocardiogram    a. 04/2008 Echo: EF 50-55%, abnl LV relaxation, mildly dil LA.  Marland Kitchen Hyperlipidemia   . Hypertension   . Morbid obesity ([place])   . Neuropathy, lower extremity   . OSA (obstructive sleep apnea)    uses CPAP nightly  . OSA on CPAP    Nocturnal polysomnogram on 01/21/2010 showed severe obstructive sleep apnea/hypopnea syndrome, AHI 74.1 per hour with non positional events, moderately loud snoring, and oxygen desaturation to a nadir of 78% on room air.  CPAP was successfully titrated to 17 CWP, AHI 1.1 per hour using a large ResMed Mirage Quattro full-face mask with heated humidifier.  Bruxism was noted.   . Osteomyelitis of ankle and foot ([place])   . Retinopathy   . Type II diabetes mellitus (HCC)    w/complication NOS, type II    Past Surgical History:  Procedure Laterality Date  . AMPUTATION Left 12/15/2014   Procedure: LEFT SECOND TOE AMPUTATION ;  Surgeon: Dorna Leitz, MD;  Location: [facility];  Service: Orthopedics;  Laterality: Left;  . 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 Kathalene Frames. Mayer Camel.  . WOUND DEBRIDEMENT Left 06/15/2016   Procedure: DEBRIDEMENT WOUND LEFT FOOT WITH GRAFT APPLICATION;  Surgeon: Landis Martins, DPM;  Location: [facility];  Service: Podiatry;  Laterality: Left;    Family History  Problem Relation Age of Onset  . Diabetes Mother     also 2 siblings  . Heart attack Father 42  . Throat cancer Brother     Social History:  reports that he quit smoking about 6 years ago. His smoking use included Cigarettes. He has a 15.00 pack-year smoking history. He has never used smokeless tobacco. He reports that he drinks about 6.6 oz of alcohol per week . He reports that he does not use drugs.    Review of Systems    Lipid management: taking Lipitor 20 mg, followed by PCP, Has low HDL also    Lab Results  Component Value Date   CHOL 110 08/09/2015  HDL 27.40 (L) 08/09/2015   LDLCALC 53 08/09/2015   TRIG 149.0 08/09/2015   CHOLHDL 4 08/09/2015            Hypertension: Has had hypertension for a few years treated with enalapril,  taking 40 mg Daily again Also on Lasix and 25 mg metoprolol  Last foot exam done by podiatrist Has been seen regularly by podiatrist    Physical Examination:  BP (!) 144/80   Pulse 70   Ht _0  (1.981 m)   Wt (!) 379 lb (171.9 kg)   SpO2 97%   BMI 43.80 kg/m       ASSESSMENT:  Diabetes type 2, uncontrolled with morbid obesity  See history of present illness for detailed discussion of current diabetes  management, blood sugar patterns and problems identified He is  insulin resistant and taking large amounts of insulin but with inadequate control  A1c is still over 9%  His blood sugars are fluctuating and partly related to using the U-500 insulin, difficulty with consistent compliance with the pre-meal insulin, variability in diet His pre-meal blood sugars are averaging about 170, most likely may have high readings after supper also  HYPERTENSION:  Blood pressure is slightly higher than usual, to be followed by PCP or cardiologist    PLAN:     New insulin doses as in patient instructions  Discussed in detail the possibility of using the V-go pump, how this works and how it would be helpful, most likely he can get more consistent control and compliance with this.  He will use U-500 insulin in this, may start with a 20 unit basal and probably needs 8-10 units bolus with every meal  Meanwhile will increase his suppertime dose by at least 10 units  He needs to check more readings after supper to help adjust his evening insulin  Consider consultation with dietitian again  Reduce evening Toujeo by 10 units to avoid overnight hypoglycemia  Continue Victoza   Patient Instructions  TOUJEO: Take 70 units in the morning and 70 in the evening  Humulin U-500: Take 25 in the morning based on blood sugar level  LUNCHTIME take at least 60 units, take larger amounts when eating a bigger meal  SUPPERTIME: Take 80 units consistently regardless of blood sugar before eating to keep blood sugar from going up over 180 after eating  More sugars at bedtime   Counseling time on subjects discussed above is over 50% of today's 25 minute visit    Debora Stockdale 07/20/2016, 1:48 PM   Note: This office note was prepared with Estate agent. Any transcriptional errors that result from this process are unintentional.

## 2016-07-21 ENCOUNTER — Ambulatory Visit (INDEPENDENT_AMBULATORY_CARE_PROVIDER_SITE_OTHER): Payer: Medicare Other | Admitting: Sports Medicine

## 2016-07-21 ENCOUNTER — Encounter: Payer: Self-pay | Admitting: Sports Medicine

## 2016-07-21 ENCOUNTER — Ambulatory Visit: Payer: Medicare Other | Admitting: Sports Medicine

## 2016-07-21 ENCOUNTER — Other Ambulatory Visit: Payer: Self-pay | Admitting: Endocrinology

## 2016-07-21 DIAGNOSIS — E1140 Type 2 diabetes mellitus with diabetic neuropathy, unspecified: Secondary | ICD-10-CM

## 2016-07-21 DIAGNOSIS — E11621 Type 2 diabetes mellitus with foot ulcer: Secondary | ICD-10-CM

## 2016-07-21 DIAGNOSIS — L97522 Non-pressure chronic ulcer of other part of left foot with fat layer exposed: Secondary | ICD-10-CM | POA: Diagnosis not present

## 2016-07-21 DIAGNOSIS — IMO0002 Reserved for concepts with insufficient information to code with codable children: Secondary | ICD-10-CM

## 2016-07-21 NOTE — Progress Notes (Signed)
Subjective: [name] is a 66 y.o. male patient seen today in office for POV #5 (DOS 06-15-16), S/P Left wound debridement and placement of stravix allograft. Patient denies pain at surgical site, denies calf pain, denies headache, chest pain, shortness of breath, nausea, vomiting, fever, or chills. Patient states that he is doing well with no issues. Has home nursing every other day. No other issues noted.   Patient Active Problem List   Diagnosis Date Noted  . Chronic pain syndrome 09/18/2015  . Chronic fatigue 04/16/2015  . Preventative health care 03/12/2015  . Family history of coronary artery disease in father 12/14/2014  . Acute osteomyelitis of toe of left foot ([place])   . Foot ulcer, left ([place]) 12/13/2014  . Paroxysmal atrial fibrillation, new onset 12/13/2014  . Elevated alkaline phosphatase level 06/11/2014  . Constipation 05/31/2014  . Vitamin D deficiency 11/17/2013  . Frequent falls 06/30/2013  . Mild memory disturbance 07/27/2012  . Anemia 02/10/2012  . Hypertension 11/18/2011  . Wheezing 06/08/2011  . Obstructive sleep apnea 12/12/2009  . EDEMA 02/13/2009  . GERD 06/27/2008  . Diabetes type 2, uncontrolled ([place]) 12/28/2007  . DIABETIC MACULAR EDEMA 10/07/2007  . Dyslipidemia 08/15/2007  . BACKGROUND DIABETIC RETINOPATHY 08/15/2007  . ERECTILE DYSFUNCTION 05/31/2007  . HEARING LOSS, SENSORINEURAL, BILATERAL 01/03/2007  . Diabetic peripheral neuropathy associated with type 2 diabetes mellitus ([place]) 12/16/2006  . Chronic kidney disease, stage III (moderate) 12/01/2006  . Obesity BMI 46 08/23/2006  . STATUS, OTHER TOE(S) AMPUTATION 05/21/2006    Current Outpatient Prescriptions on File Prior to Visit  Medication Sig Dispense Refill  . ACCU-CHEK AVIVA PLUS test strip USE TO CHECK BLOOD SUGAR FOUR TIMES DAILY BEFORE MEALS AND AT BEDTIME 150 each 3  . ACCU-CHEK FASTCLIX LANCETS MISC Check blood sugar 4 times a day before meals and bedtime dx code 250.02 insulin  requiring 102 each 6  . atorvastatin (LIPITOR) 20 MG tablet TAKE 1 TABLET(20 MG) BY MOUTH DAILY 90 tablet 3  . BESIVANCE 0.6 % SUSP Place 1 drop into both eyes 4 (four) times daily.   12  . Blood Glucose Monitoring Suppl (ACCU-CHEK AVIVA PLUS) W/DEVICE KIT 1 each by Does not apply route 4 (four) times daily -  with meals and at bedtime. 1 kit 0  . Cholecalciferol (VITAMIN D3) 2000 UNITS capsule Take 1 capsule (2,000 Units total) by mouth daily. 90 capsule 0  . ciprofloxacin (CIPRO) 500 MG tablet Take 1 tablet (500 mg total) by mouth 2 (two) times daily. 28 tablet 0  . docusate sodium (COLACE) 100 MG capsule Take 1 capsule (100 mg total) by mouth daily as needed for mild constipation. 14 capsule 0  . ELIQUIS 5 MG TABS tablet TAKE 1 TABLET(5 MG) BY MOUTH TWICE DAILY 180 tablet 0  . enalapril (VASOTEC) 20 MG tablet TAKE 2 TABLETS(40 MG) BY MOUTH DAILY 90 tablet 3  . furosemide (LASIX) 40 MG tablet TAKE 1 TABLET(40 MG) BY MOUTH TWICE DAILY 180 tablet 1  . HYDROcodone-acetaminophen (NORCO) 7.5-325 MG tablet Take 1 tablet by mouth every 6 (six) hours as needed for moderate pain. 84 tablet 0  . Insulin Glargine (TOUJEO SOLOSTAR) 300 UNIT/ML SOPN Inject 60-90 Units into the skin 2 (two) times daily. (Patient taking differently: Inject 70-80 Units into the skin 2 (two) times daily. ) 45 pen 2  . Insulin Pen Needle 31G X 5 MM MISC Use for insulin injection 5 times a day. 150 each 5  . LUMIGAN 0.01 % SOLN Place  1 drop into both eyes at bedtime.     . metoprolol succinate (TOPROL-XL) 25 MG 24 hr tablet Take 1 tablet (25 mg total) by mouth daily. 90 tablet 3  . omeprazole (PRILOSEC) 20 MG capsule TAKE 1 CAPSULE(20 MG) BY MOUTH DAILY 90 capsule 1  . pregabalin (LYRICA) 25 MG capsule Take 1 capsule (25 mg total) by mouth 3 (three) times daily. 90 capsule 2  . PROAIR HFA 108 (90 Base) MCG/ACT inhaler INHALE 2 PUFFS INTO THE LUNGS BY MOUTH EVERY 6 HOURS AS NEEDED FOR WHEEZING OR SHORTNESS OF BREATH 8.5 g 0  .  promethazine (PHENERGAN) 25 MG tablet Take 1 tablet (25 mg total) by mouth every 8 (eight) hours as needed for nausea or vomiting. 30 tablet 2  . SIMBRINZA 1-0.2 % SUSP Place 1 drop into both eyes 2 (two) times daily. Take as instructed by Dr. Katy Fitch.    Marland Kitchen VICTOZA 18 MG/3ML SOPN INJECT 1.8 MG ONCE DAILY AT THE SAME TIME 9 mL 3   No current facility-administered medications on file prior to visit.     Allergies  Allergen Reactions  . Vancomycin     REACTION: ARF    Objective: There were no vitals filed for this visit.  General: No acute distress, AAOx3  Left foot: Plantar forefoot, wound graft fully incorporated with granular wound bed measuring 3.5x3.5x0.2cm smaller than previous, mild swelling to left foot, no erythema, no warmth, no drainage, no acute signs of infection noted, Capillary fill time <3 seconds in all digits remaining, amputation status of 2nd and 3rd toes. No pain with calf compression.   Assessment and Plan:  Problem List Items Addressed This Visit    None    Visit Diagnoses    Diabetic ulcer of left foot associated with type 2 diabetes mellitus, with fat layer exposed, unspecified part of foot ([place])    -  Primary   Foot amputation status, left ([place])       Type 2 diabetes mellitus with diabetic neuropathy, unspecified long term insulin use status ([place])           -Patient seen and evaluated -Cleansed wound and debrided ulceration using sterile tissue nipper at left foot then applied prisma secured and dry sterile dressing to surgical site left foot secured with ACE wrap and stockinet  -Advised patient to make sure to keep dressings clean, dry, and intact allowing nursing to change dressings consisting of adaptic to site  -Advised patient to continue with forefoot offloading post-op shoe on left foot with use of cane -Advised patient to limit activity to necessity  -Advised patient to ice and elevate as necessary  -Will plan for possible re-application of oasis to  wound bed at next visit when it is back in stock. In the meantime, patient to call office if any issues or problems arise.   Landis Martins, DPM

## 2016-07-22 ENCOUNTER — Telehealth: Payer: Self-pay | Admitting: *Deleted

## 2016-07-22 DIAGNOSIS — E1122 Type 2 diabetes mellitus with diabetic chronic kidney disease: Secondary | ICD-10-CM | POA: Diagnosis not present

## 2016-07-22 DIAGNOSIS — N183 Chronic kidney disease, stage 3 (moderate): Secondary | ICD-10-CM | POA: Diagnosis not present

## 2016-07-22 DIAGNOSIS — E1142 Type 2 diabetes mellitus with diabetic polyneuropathy: Secondary | ICD-10-CM | POA: Diagnosis not present

## 2016-07-22 DIAGNOSIS — L97522 Non-pressure chronic ulcer of other part of left foot with fat layer exposed: Secondary | ICD-10-CM | POA: Diagnosis not present

## 2016-07-22 DIAGNOSIS — I129 Hypertensive chronic kidney disease with stage 1 through stage 4 chronic kidney disease, or unspecified chronic kidney disease: Secondary | ICD-10-CM | POA: Diagnosis not present

## 2016-07-22 DIAGNOSIS — Z794 Long term (current) use of insulin: Secondary | ICD-10-CM | POA: Diagnosis not present

## 2016-07-22 DIAGNOSIS — Z4800 Encounter for change or removal of nonsurgical wound dressing: Secondary | ICD-10-CM | POA: Diagnosis not present

## 2016-07-22 DIAGNOSIS — E11311 Type 2 diabetes mellitus with unspecified diabetic retinopathy with macular edema: Secondary | ICD-10-CM | POA: Diagnosis not present

## 2016-07-22 DIAGNOSIS — E11621 Type 2 diabetes mellitus with foot ulcer: Secondary | ICD-10-CM | POA: Diagnosis not present

## 2016-07-22 DIAGNOSIS — Z7901 Long term (current) use of anticoagulants: Secondary | ICD-10-CM | POA: Diagnosis not present

## 2016-07-22 DIAGNOSIS — Z79891 Long term (current) use of opiate analgesic: Secondary | ICD-10-CM | POA: Diagnosis not present

## 2016-07-22 NOTE — Telephone Encounter (Addendum)
Entered in error

## 2016-07-24 ENCOUNTER — Telehealth: Payer: Self-pay | Admitting: Internal Medicine

## 2016-07-24 DIAGNOSIS — Z79891 Long term (current) use of opiate analgesic: Secondary | ICD-10-CM | POA: Diagnosis not present

## 2016-07-24 DIAGNOSIS — E1122 Type 2 diabetes mellitus with diabetic chronic kidney disease: Secondary | ICD-10-CM | POA: Diagnosis not present

## 2016-07-24 DIAGNOSIS — Z4800 Encounter for change or removal of nonsurgical wound dressing: Secondary | ICD-10-CM | POA: Diagnosis not present

## 2016-07-24 DIAGNOSIS — Z7901 Long term (current) use of anticoagulants: Secondary | ICD-10-CM | POA: Diagnosis not present

## 2016-07-24 DIAGNOSIS — L97522 Non-pressure chronic ulcer of other part of left foot with fat layer exposed: Secondary | ICD-10-CM | POA: Diagnosis not present

## 2016-07-24 DIAGNOSIS — E11311 Type 2 diabetes mellitus with unspecified diabetic retinopathy with macular edema: Secondary | ICD-10-CM | POA: Diagnosis not present

## 2016-07-24 DIAGNOSIS — N183 Chronic kidney disease, stage 3 (moderate): Secondary | ICD-10-CM | POA: Diagnosis not present

## 2016-07-24 DIAGNOSIS — E11621 Type 2 diabetes mellitus with foot ulcer: Secondary | ICD-10-CM | POA: Diagnosis not present

## 2016-07-24 DIAGNOSIS — E1142 Type 2 diabetes mellitus with diabetic polyneuropathy: Secondary | ICD-10-CM | POA: Diagnosis not present

## 2016-07-24 DIAGNOSIS — I129 Hypertensive chronic kidney disease with stage 1 through stage 4 chronic kidney disease, or unspecified chronic kidney disease: Secondary | ICD-10-CM | POA: Diagnosis not present

## 2016-07-24 DIAGNOSIS — Z794 Long term (current) use of insulin: Secondary | ICD-10-CM | POA: Diagnosis not present

## 2016-07-24 NOTE — Telephone Encounter (Signed)
APT. REMINDER CALL, LMTCB °

## 2016-07-24 NOTE — Progress Notes (Signed)
Case discussed with RN -Dr. Cannon Kettle

## 2016-07-27 DIAGNOSIS — Z794 Long term (current) use of insulin: Secondary | ICD-10-CM | POA: Diagnosis not present

## 2016-07-27 DIAGNOSIS — L97522 Non-pressure chronic ulcer of other part of left foot with fat layer exposed: Secondary | ICD-10-CM | POA: Diagnosis not present

## 2016-07-27 DIAGNOSIS — Z79891 Long term (current) use of opiate analgesic: Secondary | ICD-10-CM | POA: Diagnosis not present

## 2016-07-27 DIAGNOSIS — N183 Chronic kidney disease, stage 3 (moderate): Secondary | ICD-10-CM | POA: Diagnosis not present

## 2016-07-27 DIAGNOSIS — E11311 Type 2 diabetes mellitus with unspecified diabetic retinopathy with macular edema: Secondary | ICD-10-CM | POA: Diagnosis not present

## 2016-07-27 DIAGNOSIS — E1122 Type 2 diabetes mellitus with diabetic chronic kidney disease: Secondary | ICD-10-CM | POA: Diagnosis not present

## 2016-07-27 DIAGNOSIS — Z7901 Long term (current) use of anticoagulants: Secondary | ICD-10-CM | POA: Diagnosis not present

## 2016-07-27 DIAGNOSIS — E1142 Type 2 diabetes mellitus with diabetic polyneuropathy: Secondary | ICD-10-CM | POA: Diagnosis not present

## 2016-07-27 DIAGNOSIS — I129 Hypertensive chronic kidney disease with stage 1 through stage 4 chronic kidney disease, or unspecified chronic kidney disease: Secondary | ICD-10-CM | POA: Diagnosis not present

## 2016-07-27 DIAGNOSIS — Z4800 Encounter for change or removal of nonsurgical wound dressing: Secondary | ICD-10-CM | POA: Diagnosis not present

## 2016-07-27 DIAGNOSIS — E11621 Type 2 diabetes mellitus with foot ulcer: Secondary | ICD-10-CM | POA: Diagnosis not present

## 2016-07-28 ENCOUNTER — Encounter: Payer: Self-pay | Admitting: Internal Medicine

## 2016-07-28 ENCOUNTER — Encounter: Payer: Self-pay | Admitting: Sports Medicine

## 2016-07-28 ENCOUNTER — Ambulatory Visit (INDEPENDENT_AMBULATORY_CARE_PROVIDER_SITE_OTHER): Payer: Medicare Other | Admitting: Sports Medicine

## 2016-07-28 ENCOUNTER — Ambulatory Visit (INDEPENDENT_AMBULATORY_CARE_PROVIDER_SITE_OTHER): Payer: Medicare Other | Admitting: Internal Medicine

## 2016-07-28 VITALS — BP 129/75 | HR 88 | Temp 98.1°F | Ht 78.0 in | Wt 375.8 lb

## 2016-07-28 DIAGNOSIS — L97529 Non-pressure chronic ulcer of other part of left foot with unspecified severity: Secondary | ICD-10-CM

## 2016-07-28 DIAGNOSIS — E11621 Type 2 diabetes mellitus with foot ulcer: Secondary | ICD-10-CM

## 2016-07-28 DIAGNOSIS — Z9889 Other specified postprocedural states: Secondary | ICD-10-CM

## 2016-07-28 DIAGNOSIS — Z7901 Long term (current) use of anticoagulants: Secondary | ICD-10-CM

## 2016-07-28 DIAGNOSIS — E1140 Type 2 diabetes mellitus with diabetic neuropathy, unspecified: Secondary | ICD-10-CM

## 2016-07-28 DIAGNOSIS — E1142 Type 2 diabetes mellitus with diabetic polyneuropathy: Secondary | ICD-10-CM

## 2016-07-28 DIAGNOSIS — I129 Hypertensive chronic kidney disease with stage 1 through stage 4 chronic kidney disease, or unspecified chronic kidney disease: Secondary | ICD-10-CM

## 2016-07-28 DIAGNOSIS — N183 Chronic kidney disease, stage 3 unspecified: Secondary | ICD-10-CM

## 2016-07-28 DIAGNOSIS — L97522 Non-pressure chronic ulcer of other part of left foot with fat layer exposed: Secondary | ICD-10-CM

## 2016-07-28 DIAGNOSIS — G894 Chronic pain syndrome: Secondary | ICD-10-CM

## 2016-07-28 DIAGNOSIS — E1122 Type 2 diabetes mellitus with diabetic chronic kidney disease: Secondary | ICD-10-CM | POA: Diagnosis not present

## 2016-07-28 DIAGNOSIS — Z87891 Personal history of nicotine dependence: Secondary | ICD-10-CM

## 2016-07-28 DIAGNOSIS — Z89432 Acquired absence of left foot: Secondary | ICD-10-CM

## 2016-07-28 DIAGNOSIS — I480 Paroxysmal atrial fibrillation: Secondary | ICD-10-CM

## 2016-07-28 DIAGNOSIS — E1165 Type 2 diabetes mellitus with hyperglycemia: Secondary | ICD-10-CM

## 2016-07-28 DIAGNOSIS — I10 Essential (primary) hypertension: Secondary | ICD-10-CM

## 2016-07-28 DIAGNOSIS — Z794 Long term (current) use of insulin: Secondary | ICD-10-CM

## 2016-07-28 DIAGNOSIS — I4891 Unspecified atrial fibrillation: Secondary | ICD-10-CM

## 2016-07-28 DIAGNOSIS — Z79891 Long term (current) use of opiate analgesic: Secondary | ICD-10-CM

## 2016-07-28 DIAGNOSIS — IMO0002 Reserved for concepts with insufficient information to code with codable children: Secondary | ICD-10-CM

## 2016-07-28 DIAGNOSIS — Z79899 Other long term (current) drug therapy: Secondary | ICD-10-CM

## 2016-07-28 DIAGNOSIS — M79672 Pain in left foot: Secondary | ICD-10-CM

## 2016-07-28 LAB — GLUCOSE, CAPILLARY: Glucose-Capillary: 180 mg/dL — ABNORMAL HIGH (ref 65–99)

## 2016-07-28 MED ORDER — HYDROCODONE-ACETAMINOPHEN 7.5-325 MG PO TABS: 1.0000 | ORAL_TABLET | Freq: Four times a day (QID) | ORAL | 0 refills | Status: DC | PRN

## 2016-07-28 MED ORDER — CIPROFLOXACIN HCL 500 MG PO TABS: 500.0000 mg | ORAL_TABLET | Freq: Two times a day (BID) | ORAL | 0 refills | Status: DC

## 2016-07-28 MED ORDER — DICLOFENAC SODIUM 1 % TD GEL: 4.0000 g | Freq: Four times a day (QID) | TRANSDERMAL | 2 refills | Status: DC

## 2016-07-28 NOTE — Assessment & Plan Note (Signed)
-   Patient still with pain in his legs L>R despite narco and lyrica - States he takes his lyrica only twice a week because it upsets his stomach - Will attempt to taper his narco slowly given his positive UDS and refer him to a pain clinic - Will start him on voltaren gel for his pain and attempt to take him off the lyrica. If he still wants to continue the lyrica he will need to sign a new pain contract with this medication as part of it on his next visit

## 2016-07-28 NOTE — Assessment & Plan Note (Signed)
-   Creatinine was 1.38 earlier this month which is better than his baseline - Will continue to monitor

## 2016-07-28 NOTE — Assessment & Plan Note (Signed)
-   His A1C is still persistently above 9 - Patient is following with Dr. Dwyane Dee and may be a candidate for an insulin pump. He is to follow up later this month to discuss this possibility at the endocrine office - Continue with medications per endo - Discussed diet with patient in detail - Patient to attempt to lose 1 -2 lbs by next visit

## 2016-07-28 NOTE — Assessment & Plan Note (Signed)
-   Spoke to patient extensively regarding his positive UDS for cocaine and the potential for abuse of his chronic pain medications - We will continue to slowly taper his pain medications - will reduce narco to 133 pills/month form 140 - Started patient on voltaren gel for his pain - He is only taking lyrica twice a week for his neuropathy as it upsets his stomach. Will attempt to take this off on his next visit - Will refer to pain clinic for his chronic pain - We will sign a new pain contract on his next visit to reflect his current medication dosage as well as lyrica if he continues to require it

## 2016-07-28 NOTE — Progress Notes (Signed)
Subjective: [name] is a 66 y.o. male patient seen today in office for POV #6 (DOS 06-15-16), S/P Left wound debridement and placement of stravix allograft. Patient denies pain at surgical site, denies calf pain, denies headache, chest pain, shortness of breath, nausea, vomiting, fever, or chills. Patient states that he is doing well with no issues. Has home nursing every other day. No other issues noted.   Patient Active Problem List   Diagnosis Date Noted  . Chronic pain syndrome 09/18/2015  . Preventative health care 03/12/2015  . Family history of coronary artery disease in father 12/14/2014  . Acute osteomyelitis of toe of left foot ([place])   . Foot ulcer, left ([place]) 12/13/2014  . Paroxysmal atrial fibrillation, new onset 12/13/2014  . Elevated alkaline phosphatase level 06/11/2014  . Constipation 05/31/2014  . Vitamin D deficiency 11/17/2013  . Mild memory disturbance 07/27/2012  . Anemia 02/10/2012  . Hypertension 11/18/2011  . Wheezing 06/08/2011  . Obstructive sleep apnea 12/12/2009  . EDEMA 02/13/2009  . GERD 06/27/2008  . Diabetes type 2, uncontrolled ([place]) 12/28/2007  . DIABETIC MACULAR EDEMA 10/07/2007  . Dyslipidemia 08/15/2007  . BACKGROUND DIABETIC RETINOPATHY 08/15/2007  . ERECTILE DYSFUNCTION 05/31/2007  . HEARING LOSS, SENSORINEURAL, BILATERAL 01/03/2007  . Diabetic peripheral neuropathy associated with type 2 diabetes mellitus ([place]) 12/16/2006  . Chronic kidney disease, stage III (moderate) 12/01/2006  . Obesity BMI 46 08/23/2006  . STATUS, OTHER TOE(S) AMPUTATION 05/21/2006    Current Outpatient Prescriptions on File Prior to Visit  Medication Sig Dispense Refill  . ACCU-CHEK AVIVA PLUS test strip USE TO CHECK BLOOD SUGAR FOUR TIMES DAILY BEFORE MEALS AND AT BEDTIME 150 each 3  . ACCU-CHEK FASTCLIX LANCETS MISC Check blood sugar 4 times a day before meals and bedtime dx code 250.02 insulin requiring 102 each 6  . atorvastatin (LIPITOR) 20 MG tablet  TAKE 1 TABLET(20 MG) BY MOUTH DAILY 90 tablet 3  . BESIVANCE 0.6 % SUSP Place 1 drop into both eyes 4 (four) times daily.   12  . Blood Glucose Monitoring Suppl (ACCU-CHEK AVIVA PLUS) W/DEVICE KIT 1 each by Does not apply route 4 (four) times daily -  with meals and at bedtime. 1 kit 0  . Cholecalciferol (VITAMIN D3) 2000 UNITS capsule Take 1 capsule (2,000 Units total) by mouth daily. 90 capsule 0  . diclofenac sodium (VOLTAREN) 1 % GEL Apply 4 g topically 4 (four) times daily. 100 g 2  . docusate sodium (COLACE) 100 MG capsule Take 1 capsule (100 mg total) by mouth daily as needed for mild constipation. 14 capsule 0  . ELIQUIS 5 MG TABS tablet TAKE 1 TABLET(5 MG) BY MOUTH TWICE DAILY 180 tablet 0  . enalapril (VASOTEC) 20 MG tablet TAKE 2 TABLETS(40 MG) BY MOUTH DAILY 90 tablet 3  . furosemide (LASIX) 40 MG tablet TAKE 1 TABLET(40 MG) BY MOUTH TWICE DAILY 180 tablet 1  . HUMULIN R U-500 KWIKPEN 500 UNIT/ML kwikpen INJECT 20 UNITS AT BREAKFAST, 60 UNITS AT LUNCH, AND 70 UNITS AT DINNER 18 mL 0  . HYDROcodone-acetaminophen (NORCO) 7.5-325 MG tablet Take 1 tablet by mouth every 6 (six) hours as needed for moderate pain. 133 tablet 0  . Insulin Glargine (TOUJEO SOLOSTAR) 300 UNIT/ML SOPN Inject 60-90 Units into the skin 2 (two) times daily. (Patient taking differently: Inject 70-80 Units into the skin 2 (two) times daily. ) 45 pen 2  . Insulin Pen Needle 31G X 5 MM MISC Use for insulin injection 5  times a day. 150 each 5  . LUMIGAN 0.01 % SOLN Place 1 drop into both eyes at bedtime.     . metoprolol succinate (TOPROL-XL) 25 MG 24 hr tablet Take 1 tablet (25 mg total) by mouth daily. 90 tablet 3  . omeprazole (PRILOSEC) 20 MG capsule TAKE 1 CAPSULE(20 MG) BY MOUTH DAILY 90 capsule 1  . pregabalin (LYRICA) 25 MG capsule Take 1 capsule (25 mg total) by mouth 3 (three) times daily. 90 capsule 2  . PROAIR HFA 108 (90 Base) MCG/ACT inhaler INHALE 2 PUFFS INTO THE LUNGS BY MOUTH EVERY 6 HOURS AS NEEDED FOR  WHEEZING OR SHORTNESS OF BREATH 8.5 g 0  . promethazine (PHENERGAN) 25 MG tablet Take 1 tablet (25 mg total) by mouth every 8 (eight) hours as needed for nausea or vomiting. 30 tablet 2  . SIMBRINZA 1-0.2 % SUSP Place 1 drop into both eyes 2 (two) times daily. Take as instructed by Dr. Katy Fitch.    Marland Kitchen VICTOZA 18 MG/3ML SOPN INJECT 1.8 MG ONCE DAILY AT THE SAME TIME 9 mL 3   No current facility-administered medications on file prior to visit.     Allergies  Allergen Reactions  . Vancomycin     REACTION: ARF    Objective: There were no vitals filed for this visit.  General: No acute distress, AAOx3  Left foot: Plantar forefoot, wound graft fully incorporated with granular wound bed measuring 4x3x0.2cm smaller than previous, mild periwound maceration, swelling to left foot, no erythema, no warmth, no active drainage however on inner guaze there is drainage, no acute signs of infection noted, Capillary fill time <3 seconds in all digits remaining, amputation status of 2nd and 3rd toes. No pain with calf compression.   Assessment and Plan:  Problem List Items Addressed This Visit    None    Visit Diagnoses    Diabetic ulcer of left foot associated with type 2 diabetes mellitus, with fat layer exposed, unspecified part of foot ([place])    -  Primary   Relevant Medications   ciprofloxacin (CIPRO) 500 MG tablet   Foot amputation status, left ([facility])       Relevant Medications   ciprofloxacin (CIPRO) 500 MG tablet   Type 2 diabetes mellitus with diabetic neuropathy, unspecified long term insulin use status ([facility])       Relevant Medications   ciprofloxacin (CIPRO) 500 MG tablet   S/P foot surgery, left       Relevant Medications   ciprofloxacin (CIPRO) 500 MG tablet   Foot pain, left       Relevant Medications   ciprofloxacin (CIPRO) 500 MG tablet       -Patient seen and evaluated -Cleansed wound applied betadine periwound and prisma centrally secured and dry sterile dressing to surgical site  left foot secured with ACE wrap and stockinet  -Advised patient to make sure to keep dressings clean, dry, and intact allowing nursing to change dressings consisting of betadine periwound and algisite or prisma to the center at the wound -Advised patient to continue with forefoot offloading post-op shoe on left foot with use of cane -Advised patient to limit activity to necessity  -Advised patient to ice and elevate as necessary  -Refilled Cipro for preventative measures  -Will plan for possible re-application of oasis to wound bed at next visit when it is back in stock. In the meantime, patient to call office if any issues or problems arise.   Landis Martins, DPM

## 2016-07-28 NOTE — Progress Notes (Signed)
   Subjective:    Patient ID: [name], male    DOB: 26-Dec-1950, 66 y.o.   MRN: [id number]  HPI I have seen and examined the patient. Patient is here for routine follow up of his HTN and chronic pain.  Patient states his pain is better controlled and that the lyrica upsets his stomach. He is only taking lyrica twice a week.He is still requiring norco for his pain control. He did have a UDS positive for cocaine on 05/01/16 but subsequent UDS on multiple occasions have been appropriate.  He is compliant with all his medications and has no complaints at this time.  He is going to follow up with his podiatrist today for his foot ulcer which he states is slowly healing.    Review of Systems  Constitutional: Negative.  Negative for chills, fatigue and fever.  HENT: Negative.   Respiratory: Negative.   Cardiovascular: Negative.   Gastrointestinal: Negative.  Negative for constipation, diarrhea and vomiting.  Musculoskeletal: Positive for arthralgias. Negative for back pain.  Neurological: Negative.   Psychiatric/Behavioral: Negative.        Objective:   Physical Exam  Constitutional: He is oriented to person, place, and time. He appears well-developed and well-nourished.  HENT:  Head: Normocephalic and atraumatic.  Neck: Neck supple.  Cardiovascular: Normal rate, regular rhythm and normal heart sounds.   Pulmonary/Chest: Effort normal and breath sounds normal. No respiratory distress. He has no wheezes.  Abdominal: Soft. Bowel sounds are normal. He exhibits no distension. There is no tenderness.  Musculoskeletal: Normal range of motion.  Trace b/l LE edema, L foot dressing intact  Lymphadenopathy:    He has no cervical adenopathy.  Neurological: He is alert and oriented to person, place, and time.  Skin: Skin is warm. No erythema.  Psychiatric: He has a normal mood and affect. His behavior is normal.          Assessment & Plan:  Please see problem based charting  for assessment and plan:

## 2016-07-28 NOTE — Assessment & Plan Note (Signed)
-   Patient is following with podiatry and has an appointment later today - Left dressing intact as patient will follow with podiatry today - He has completed his course of antibiotics

## 2016-07-28 NOTE — Assessment & Plan Note (Signed)
BP Readings from Last 3 Encounters:  07/28/16 129/75  07/20/16 (!) 144/80  06/26/16 130/71    Lab Results  Component Value Date   NA 143 07/16/2016   K 3.7 07/16/2016   CREATININE 1.38 07/16/2016    Assessment: Blood pressure control:  well controlled Progress toward BP goal:   at goal Comments: compliant with toprol, lasix and enalapril  Plan: Medications:  continue current medications Educational resources provided: brochure, other (see comments) (denies information need ) Self management tools provided: home blood pressure logbook Other plans: Patient to maintain BP log and bring it on follow up

## 2016-07-28 NOTE — Assessment & Plan Note (Signed)
-   Patient has a regular rate and rhythm on exam today - He is compliant with his toprol and eliquis

## 2016-07-28 NOTE — Patient Instructions (Signed)
- It was a pleasure seeing you today - We will decrease the amount of pain pills you are taking to 133/month - Please call with any questions - Please continue to follow up with your foot doctor for your ulcer - BP is well controlled today. Please bring your BP log on follow up - Please follow up in 1 month - Please continue to follow your diabetic diet and we will attempt to have a 1-2 lb weight loss by your next visit  Calorie Counting for Weight Loss Calories are energy you get from the things you eat and drink. Your body uses this energy to keep you going throughout the day. The number of calories you eat affects your weight. When you eat more calories than your body needs, your body stores the extra calories as fat. When you eat fewer calories than your body needs, your body burns fat to get the energy it needs. Calorie counting means keeping track of how many calories you eat and drink each day. If you make sure to eat fewer calories than your body needs, you should lose weight. In order for calorie counting to work, you will need to eat the number of calories that are right for you in a day to lose a healthy amount of weight per week. A healthy amount of weight to lose per week is usually 1-2 lb (0.5-0.9 kg). A dietitian can determine how many calories you need in a day and give you suggestions on how to reach your calorie goal.  WHAT IS MY MY PLAN? My goal is to have __________ calories per day.  If I have this many calories per day, I should lose around __________ pounds per week. WHAT DO I NEED TO KNOW ABOUT CALORIE COUNTING? In order to meet your daily calorie goal, you will need to:  Find out how many calories are in each food you would like to eat. Try to do this before you eat.  Decide how much of the food you can eat.  Write down what you ate and how many calories it had. Doing this is called keeping a food log. WHERE DO I FIND CALORIE INFORMATION? The number of calories in a  food can be found on a Nutrition Facts label. Note that all the information on a label is based on a specific serving of the food. If a food does not have a Nutrition Facts label, try to look up the calories online or ask your dietitian for help. HOW DO I DECIDE HOW MUCH TO EAT? To decide how much of the food you can eat, you will need to consider both the number of calories in one serving and the size of one serving. This information can be found on the Nutrition Facts label. If a food does not have a Nutrition Facts label, look up the information online or ask your dietitian for help. Remember that calories are listed per serving. If you choose to have more than one serving of a food, you will have to multiply the calories per serving by the amount of servings you plan to eat. For example, the label on a package of bread might say that a serving size is 1 slice and that there are 90 calories in a serving. If you eat 1 slice, you will have eaten 90 calories. If you eat 2 slices, you will have eaten 180 calories. HOW DO I KEEP A FOOD LOG? After each meal, record the following information in your food  log:  What you ate.  How much of it you ate.  How many calories it had.  Then, add up your calories. Keep your food log near you, such as in a small notebook in your pocket. Another option is to use a mobile app or website. Some programs will calculate calories for you and show you how many calories you have left each time you add an item to the log. WHAT ARE SOME CALORIE COUNTING TIPS?  Use your calories on foods and drinks that will fill you up and not leave you hungry. Some examples of this include foods like nuts and nut butters, vegetables, lean proteins, and high-fiber foods (more than 5 g fiber per serving).  Eat nutritious foods and avoid empty calories. Empty calories are calories you get from foods or beverages that do not have many nutrients, such as candy and soda. It is better to have a  nutritious high-calorie food (such as an avocado) than a food with few nutrients (such as a bag of chips).  Know how many calories are in the foods you eat most often. This way, you do not have to look up how many calories they have each time you eat them.  Look out for foods that may seem like low-calorie foods but are really high-calorie foods, such as baked goods, soda, and fat-free candy.  Pay attention to calories in drinks. Drinks such as sodas, specialty coffee drinks, alcohol, and juices have a lot of calories yet do not fill you up. Choose low-calorie drinks like water and diet drinks.  Focus your calorie counting efforts on higher calorie items. Logging the calories in a garden salad that contains only vegetables is less important than calculating the calories in a milk shake.  Find a way of tracking calories that works for you. Get creative. Most people who are successful find ways to keep track of how much they eat in a day, even if they do not count every calorie. WHAT ARE SOME PORTION CONTROL TIPS?  Know how many calories are in a serving. This will help you know how many servings of a certain food you can have.  Use a measuring cup to measure serving sizes. This is helpful when you start out. With time, you will be able to estimate serving sizes for some foods.  Take some time to put servings of different foods on your favorite plates, bowls, and cups so you know what a serving looks like.  Try not to eat straight from a bag or box. Doing this can lead to overeating. Put the amount you would like to eat in a cup or on a plate to make sure you are eating the right portion.  Use smaller plates, glasses, and bowls to prevent overeating. This is a quick and easy way to practice portion control. If your plate is smaller, less food can fit on it.  Try not to multitask while eating, such as watching TV or using your computer. If it is time to eat, sit down at a table and enjoy your  food. Doing this will help you to start recognizing when you are full. It will also make you more aware of what and how much you are eating. HOW CAN I CALORIE COUNT WHEN EATING OUT?  Ask for smaller portion sizes or child-sized portions.  Consider sharing an entree and sides instead of getting your own entree.  If you get your own entree, eat only half. Ask for a box at  the beginning of your meal and put the rest of your entree in it so you are not tempted to eat it.  Look for the calories on the menu. If calories are listed, choose the lower calorie options.  Choose dishes that include vegetables, fruits, whole grains, low-fat dairy products, and lean protein. Focusing on smart food choices from each of the 5 food groups can help you stay on track at restaurants.  Choose items that are boiled, broiled, grilled, or steamed.  Choose water, milk, unsweetened iced tea, or other drinks without added sugars. If you want an alcoholic beverage, choose a lower calorie option. For example, a regular margarita can have up to 700 calories and a glass of wine has around 150.  Stay away from items that are buttered, battered, fried, or served with cream sauce. Items labeled "crispy" are usually fried, unless stated otherwise.  Ask for dressings, sauces, and syrups on the side. These are usually very high in calories, so do not eat much of them.  Watch out for salads. Many people think salads are a healthy option, but this is often not the case. Many salads come with bacon, fried chicken, lots of cheese, fried chips, and dressing. All of these items have a lot of calories. If you want a salad, choose a garden salad and ask for grilled meats or steak. Ask for the dressing on the side, or ask for olive oil and vinegar or lemon to use as dressing.  Estimate how many servings of a food you are given. For example, a serving of cooked rice is  cup or about the size of half a tennis ball or one cupcake wrapper.  Knowing serving sizes will help you be aware of how much food you are eating at restaurants. The list below tells you how big or small some common portion sizes are based on everyday objects.  1 oz-4 stacked dice.  3 oz-1 deck of cards.  1 tsp-1 dice.  1 Tbsp- a Ping-Pong ball.  2 Tbsp-1 Ping-Pong ball.   cup-1 tennis ball or 1 cupcake wrapper.  1 cup-1 baseball. This information is not intended to replace advice given to you by your health care provider. Make sure you discuss any questions you have with your health care provider. Document Released: 06/29/2005 Document Revised: 07/20/2014 Document Reviewed: 05/04/2013 Elsevier Interactive Patient Education  2017 [employer].

## 2016-07-31 ENCOUNTER — Telehealth: Payer: Self-pay | Admitting: *Deleted

## 2016-07-31 DIAGNOSIS — Z794 Long term (current) use of insulin: Secondary | ICD-10-CM | POA: Diagnosis not present

## 2016-07-31 DIAGNOSIS — E1142 Type 2 diabetes mellitus with diabetic polyneuropathy: Secondary | ICD-10-CM | POA: Diagnosis not present

## 2016-07-31 DIAGNOSIS — E1122 Type 2 diabetes mellitus with diabetic chronic kidney disease: Secondary | ICD-10-CM | POA: Diagnosis not present

## 2016-07-31 DIAGNOSIS — L97522 Non-pressure chronic ulcer of other part of left foot with fat layer exposed: Secondary | ICD-10-CM | POA: Diagnosis not present

## 2016-07-31 DIAGNOSIS — E11621 Type 2 diabetes mellitus with foot ulcer: Secondary | ICD-10-CM | POA: Diagnosis not present

## 2016-07-31 DIAGNOSIS — Z7901 Long term (current) use of anticoagulants: Secondary | ICD-10-CM | POA: Diagnosis not present

## 2016-07-31 DIAGNOSIS — Z4800 Encounter for change or removal of nonsurgical wound dressing: Secondary | ICD-10-CM | POA: Diagnosis not present

## 2016-07-31 DIAGNOSIS — N183 Chronic kidney disease, stage 3 (moderate): Secondary | ICD-10-CM | POA: Diagnosis not present

## 2016-07-31 DIAGNOSIS — I129 Hypertensive chronic kidney disease with stage 1 through stage 4 chronic kidney disease, or unspecified chronic kidney disease: Secondary | ICD-10-CM | POA: Diagnosis not present

## 2016-07-31 DIAGNOSIS — E11311 Type 2 diabetes mellitus with unspecified diabetic retinopathy with macular edema: Secondary | ICD-10-CM | POA: Diagnosis not present

## 2016-07-31 DIAGNOSIS — Z79891 Long term (current) use of opiate analgesic: Secondary | ICD-10-CM | POA: Diagnosis not present

## 2016-07-31 NOTE — Telephone Encounter (Addendum)
-----   Message from Landis Martins, [place] sent at 07/28/2016  5:48 PM EST ----- Regarding: Wound care Wound care orders. Betadine to white macerated areas around the wound and to the wound bed use Algisite or PRISMA to wound bed to absorb any drainage  -Dr. Cannon Kettle. 07/31/2016-Faxed 07/28/2016- orders to [facility]. 08/04/2016-Copy of 08/04/2016 12:43pm orders faxed to [facility]. 08/05/2016-Crystal Autumn Patty - [facility] for orders. I faxed orders yesterday and will call [place] tomorrow. 08/06/2016-I spoke with [facility] and she states she had thought [facility] had not had the Aligsite and wanted to know if they could use the Prisma, but they had the Aligsite.

## 2016-08-03 DIAGNOSIS — E1122 Type 2 diabetes mellitus with diabetic chronic kidney disease: Secondary | ICD-10-CM | POA: Diagnosis not present

## 2016-08-03 DIAGNOSIS — Z794 Long term (current) use of insulin: Secondary | ICD-10-CM | POA: Diagnosis not present

## 2016-08-03 DIAGNOSIS — N183 Chronic kidney disease, stage 3 (moderate): Secondary | ICD-10-CM | POA: Diagnosis not present

## 2016-08-03 DIAGNOSIS — Z79891 Long term (current) use of opiate analgesic: Secondary | ICD-10-CM | POA: Diagnosis not present

## 2016-08-03 DIAGNOSIS — Z4800 Encounter for change or removal of nonsurgical wound dressing: Secondary | ICD-10-CM | POA: Diagnosis not present

## 2016-08-03 DIAGNOSIS — E11311 Type 2 diabetes mellitus with unspecified diabetic retinopathy with macular edema: Secondary | ICD-10-CM | POA: Diagnosis not present

## 2016-08-03 DIAGNOSIS — E11621 Type 2 diabetes mellitus with foot ulcer: Secondary | ICD-10-CM | POA: Diagnosis not present

## 2016-08-03 DIAGNOSIS — I129 Hypertensive chronic kidney disease with stage 1 through stage 4 chronic kidney disease, or unspecified chronic kidney disease: Secondary | ICD-10-CM | POA: Diagnosis not present

## 2016-08-03 DIAGNOSIS — L97522 Non-pressure chronic ulcer of other part of left foot with fat layer exposed: Secondary | ICD-10-CM | POA: Diagnosis not present

## 2016-08-03 DIAGNOSIS — E1142 Type 2 diabetes mellitus with diabetic polyneuropathy: Secondary | ICD-10-CM | POA: Diagnosis not present

## 2016-08-03 DIAGNOSIS — Z7901 Long term (current) use of anticoagulants: Secondary | ICD-10-CM | POA: Diagnosis not present

## 2016-08-04 ENCOUNTER — Encounter: Payer: Medicare Other | Attending: Endocrinology | Admitting: Nutrition

## 2016-08-04 ENCOUNTER — Telehealth: Payer: Self-pay | Admitting: *Deleted

## 2016-08-04 ENCOUNTER — Encounter: Payer: Self-pay | Admitting: Sports Medicine

## 2016-08-04 ENCOUNTER — Ambulatory Visit (INDEPENDENT_AMBULATORY_CARE_PROVIDER_SITE_OTHER): Payer: Medicare Other | Admitting: Sports Medicine

## 2016-08-04 DIAGNOSIS — E11621 Type 2 diabetes mellitus with foot ulcer: Secondary | ICD-10-CM | POA: Diagnosis not present

## 2016-08-04 DIAGNOSIS — L97522 Non-pressure chronic ulcer of other part of left foot with fat layer exposed: Secondary | ICD-10-CM

## 2016-08-04 DIAGNOSIS — E1140 Type 2 diabetes mellitus with diabetic neuropathy, unspecified: Secondary | ICD-10-CM

## 2016-08-04 DIAGNOSIS — Z9889 Other specified postprocedural states: Secondary | ICD-10-CM

## 2016-08-04 DIAGNOSIS — Z89432 Acquired absence of left foot: Secondary | ICD-10-CM

## 2016-08-04 DIAGNOSIS — IMO0002 Reserved for concepts with insufficient information to code with codable children: Secondary | ICD-10-CM

## 2016-08-04 LAB — TOXASSURE SELECT,+ANTIDEPR,UR

## 2016-08-04 NOTE — Telephone Encounter (Signed)
Information was set to [facility] for [place] for Diclofenac Gel.  Awaiting response within 72 hours.  [name], RN 08/04/2016 10:01 AM

## 2016-08-04 NOTE — Progress Notes (Addendum)
Subjective: [name] is a 65 y.o. male patient seen today in office for POV #7 (DOS 06-15-16), S/P Left wound debridement and placement of stravix allograft. Patient denies pain at surgical site, denies calf pain, denies headache, chest pain, shortness of breath, nausea, vomiting, fever, or chills. Patient states that he is doing well with no issues. Has home nursing every other day. No other issues noted.   Patient Active Problem List   Diagnosis Date Noted  . Chronic pain syndrome 09/18/2015  . Preventative health care 03/12/2015  . Family history of coronary artery disease in father 12/14/2014  . Acute osteomyelitis of toe of left foot (HCC)   . Foot ulcer, left (HCC) 12/13/2014  . Paroxysmal atrial fibrillation, new onset 12/13/2014  . Elevated alkaline phosphatase level 06/11/2014  . Constipation 05/31/2014  . Vitamin D deficiency 11/17/2013  . Mild memory disturbance 07/27/2012  . Anemia 02/10/2012  . Hypertension 11/18/2011  . Wheezing 06/08/2011  . Obstructive sleep apnea 12/12/2009  . EDEMA 02/13/2009  . GERD 06/27/2008  . Diabetes type 2, uncontrolled (HCC) 12/28/2007  . DIABETIC MACULAR EDEMA 10/07/2007  . Dyslipidemia 08/15/2007  . BACKGROUND DIABETIC RETINOPATHY 08/15/2007  . ERECTILE DYSFUNCTION 05/31/2007  . HEARING LOSS, SENSORINEURAL, BILATERAL 01/03/2007  . Diabetic peripheral neuropathy associated with type 2 diabetes mellitus (HCC) 12/16/2006  . Chronic kidney disease, stage III (moderate) 12/01/2006  . Obesity BMI 46 08/23/2006  . STATUS, OTHER TOE(S) AMPUTATION 05/21/2006    Current Outpatient Prescriptions on File Prior to Visit  Medication Sig Dispense Refill  . ACCU-CHEK AVIVA PLUS test strip USE TO CHECK BLOOD SUGAR FOUR TIMES DAILY BEFORE MEALS AND AT BEDTIME 150 each 3  . ACCU-CHEK FASTCLIX LANCETS MISC Check blood sugar 4 times a day before meals and bedtime dx code 250.02 insulin requiring 102 each 6  . atorvastatin (LIPITOR) 20 MG tablet  TAKE 1 TABLET(20 MG) BY MOUTH DAILY 90 tablet 3  . BESIVANCE 0.6 % SUSP Place 1 drop into both eyes 4 (four) times daily.   12  . Blood Glucose Monitoring Suppl (ACCU-CHEK AVIVA PLUS) W/DEVICE KIT 1 each by Does not apply route 4 (four) times daily -  with meals and at bedtime. 1 kit 0  . Cholecalciferol (VITAMIN D3) 2000 UNITS capsule Take 1 capsule (2,000 Units total) by mouth daily. 90 capsule 0  . ciprofloxacin (CIPRO) 500 MG tablet Take 1 tablet (500 mg total) by mouth 2 (two) times daily. 20 tablet 0  . diclofenac sodium (VOLTAREN) 1 % GEL Apply 4 g topically 4 (four) times daily. 100 g 2  . docusate sodium (COLACE) 100 MG capsule Take 1 capsule (100 mg total) by mouth daily as needed for mild constipation. 14 capsule 0  . ELIQUIS 5 MG TABS tablet TAKE 1 TABLET(5 MG) BY MOUTH TWICE DAILY 180 tablet 0  . enalapril (VASOTEC) 20 MG tablet TAKE 2 TABLETS(40 MG) BY MOUTH DAILY 90 tablet 3  . furosemide (LASIX) 40 MG tablet TAKE 1 TABLET(40 MG) BY MOUTH TWICE DAILY 180 tablet 1  . HUMULIN R U-500 KWIKPEN 500 UNIT/ML kwikpen INJECT 20 UNITS AT BREAKFAST, 60 UNITS AT LUNCH, AND 70 UNITS AT DINNER 18 mL 0  . HYDROcodone-acetaminophen (NORCO) 7.5-325 MG tablet Take 1 tablet by mouth every 6 (six) hours as needed for moderate pain. 133 tablet 0  . Insulin Glargine (TOUJEO SOLOSTAR) 300 UNIT/ML SOPN Inject 60-90 Units into the skin 2 (two) times daily. (Patient taking differently: Inject 70-80 Units into the skin 2 (  two) times daily. ) 45 pen 2  . Insulin Pen Needle 31G X 5 MM MISC Use for insulin injection 5 times a day. 150 each 5  . LUMIGAN 0.01 % SOLN Place 1 drop into both eyes at bedtime.     . metoprolol succinate (TOPROL-XL) 25 MG 24 hr tablet Take 1 tablet (25 mg total) by mouth daily. 90 tablet 3  . omeprazole (PRILOSEC) 20 MG capsule TAKE 1 CAPSULE(20 MG) BY MOUTH DAILY 90 capsule 1  . pregabalin (LYRICA) 25 MG capsule Take 1 capsule (25 mg total) by mouth 3 (three) times daily. 90 capsule 2   . PROAIR HFA 108 (90 Base) MCG/ACT inhaler INHALE 2 PUFFS INTO THE LUNGS BY MOUTH EVERY 6 HOURS AS NEEDED FOR WHEEZING OR SHORTNESS OF BREATH 8.5 g 0  . promethazine (PHENERGAN) 25 MG tablet Take 1 tablet (25 mg total) by mouth every 8 (eight) hours as needed for nausea or vomiting. 30 tablet 2  . SIMBRINZA 1-0.2 % SUSP Place 1 drop into both eyes 2 (two) times daily. Take as instructed by Dr. Groat.    . VICTOZA 18 MG/3ML SOPN INJECT 1.8 MG ONCE DAILY AT THE SAME TIME 9 mL 3   No current facility-administered medications on file prior to visit.     Allergies  Allergen Reactions  . Vancomycin     REACTION: ARF    Objective: There were no vitals filed for this visit.  General: No acute distress, AAOx3  Left foot: Plantar forefoot, wound graft fully incorporated with granular wound bed measuring 3.5x3x0.2cm smaller than previous, mild periwound maceration, swelling to left foot, no erythema, no warmth, no active drainage however on inner guaze there is drainage, no acute signs of infection noted, Capillary fill time <3 seconds in all digits remaining, amputation status of 2nd and 3rd toes. No pain with calf compression.   Assessment and Plan:  Problem List Items Addressed This Visit    None    Visit Diagnoses    Diabetic ulcer of left foot associated with type 2 diabetes mellitus, with fat layer exposed, unspecified part of foot (HCC)    -  Primary   Foot amputation status, left (HCC)       Type 2 diabetes mellitus with diabetic neuropathy, unspecified long term insulin use status (HCC)       S/P foot surgery, left           -Patient seen and evaluated -Cleansed wound applied Endoform fenestrated 2x2 dermal template lot #[id number], exp 08/2018, ref # [id number] dressing and hydrofora blue non adherant layer secured and dry sterile dressing to surgical site left foot secured with ACE wrap and stockinet  -Advised patient to make sure to keep dressings clean, dry, and intact allowing nursing to  change dressings consisting of algisite or nonaherant dressing to the center at the wound covered with sterile dressings -Advised patient to continue with forefoot offloading post-op shoe on left foot with use of cane -Advised patient to limit activity to necessity  -Advised patient to ice and elevate as necessary  -Continue with Cipro until completed -Will plan for possible re-application of Endoform to wound bed at next visit. In the meantime, patient to call office if any issues or problems arise.   Titorya Stover, DPM 

## 2016-08-04 NOTE — Telephone Encounter (Signed)
-----   Message from Landis Martins, [place] sent at 08/04/2016 12:43 PM EST ----- Regarding: wound care Graft was placed to foot ulcer, the graft will absorb into wound bed, remove blue non adherant padding and lightly cleanse with saline and pat dry, replace to wound bed aligsite or absorbant padding dressing to ulcer covered with 4x4, abd, kerlix, ACE wrap  Dr Cannon Kettle

## 2016-08-05 DIAGNOSIS — N183 Chronic kidney disease, stage 3 (moderate): Secondary | ICD-10-CM | POA: Diagnosis not present

## 2016-08-05 DIAGNOSIS — L97522 Non-pressure chronic ulcer of other part of left foot with fat layer exposed: Secondary | ICD-10-CM | POA: Diagnosis not present

## 2016-08-05 DIAGNOSIS — Z79891 Long term (current) use of opiate analgesic: Secondary | ICD-10-CM | POA: Diagnosis not present

## 2016-08-05 DIAGNOSIS — E11621 Type 2 diabetes mellitus with foot ulcer: Secondary | ICD-10-CM | POA: Diagnosis not present

## 2016-08-05 DIAGNOSIS — Z7901 Long term (current) use of anticoagulants: Secondary | ICD-10-CM | POA: Diagnosis not present

## 2016-08-05 DIAGNOSIS — I129 Hypertensive chronic kidney disease with stage 1 through stage 4 chronic kidney disease, or unspecified chronic kidney disease: Secondary | ICD-10-CM | POA: Diagnosis not present

## 2016-08-05 DIAGNOSIS — Z794 Long term (current) use of insulin: Secondary | ICD-10-CM | POA: Diagnosis not present

## 2016-08-05 DIAGNOSIS — Z4800 Encounter for change or removal of nonsurgical wound dressing: Secondary | ICD-10-CM | POA: Diagnosis not present

## 2016-08-05 DIAGNOSIS — E11311 Type 2 diabetes mellitus with unspecified diabetic retinopathy with macular edema: Secondary | ICD-10-CM | POA: Diagnosis not present

## 2016-08-05 DIAGNOSIS — E1142 Type 2 diabetes mellitus with diabetic polyneuropathy: Secondary | ICD-10-CM | POA: Diagnosis not present

## 2016-08-05 DIAGNOSIS — E1122 Type 2 diabetes mellitus with diabetic chronic kidney disease: Secondary | ICD-10-CM | POA: Diagnosis not present

## 2016-08-07 ENCOUNTER — Ambulatory Visit: Payer: Medicare Other | Admitting: Endocrinology

## 2016-08-07 DIAGNOSIS — L97522 Non-pressure chronic ulcer of other part of left foot with fat layer exposed: Secondary | ICD-10-CM | POA: Diagnosis not present

## 2016-08-07 DIAGNOSIS — Z794 Long term (current) use of insulin: Secondary | ICD-10-CM | POA: Diagnosis not present

## 2016-08-07 DIAGNOSIS — E1122 Type 2 diabetes mellitus with diabetic chronic kidney disease: Secondary | ICD-10-CM | POA: Diagnosis not present

## 2016-08-07 DIAGNOSIS — E11621 Type 2 diabetes mellitus with foot ulcer: Secondary | ICD-10-CM | POA: Diagnosis not present

## 2016-08-07 DIAGNOSIS — I129 Hypertensive chronic kidney disease with stage 1 through stage 4 chronic kidney disease, or unspecified chronic kidney disease: Secondary | ICD-10-CM | POA: Diagnosis not present

## 2016-08-07 DIAGNOSIS — E11311 Type 2 diabetes mellitus with unspecified diabetic retinopathy with macular edema: Secondary | ICD-10-CM | POA: Diagnosis not present

## 2016-08-07 DIAGNOSIS — E1142 Type 2 diabetes mellitus with diabetic polyneuropathy: Secondary | ICD-10-CM | POA: Diagnosis not present

## 2016-08-07 DIAGNOSIS — Z79891 Long term (current) use of opiate analgesic: Secondary | ICD-10-CM | POA: Diagnosis not present

## 2016-08-07 DIAGNOSIS — Z4800 Encounter for change or removal of nonsurgical wound dressing: Secondary | ICD-10-CM | POA: Diagnosis not present

## 2016-08-07 DIAGNOSIS — Z7901 Long term (current) use of anticoagulants: Secondary | ICD-10-CM | POA: Diagnosis not present

## 2016-08-07 DIAGNOSIS — N183 Chronic kidney disease, stage 3 (moderate): Secondary | ICD-10-CM | POA: Diagnosis not present

## 2016-08-08 NOTE — Progress Notes (Signed)
He was shown the V-Go pump and we discussed how it works.  He would like to know the cost of this device before he tries it.  We filled out paperwork and it was faxed to [facility].  He was told to call me if he is interested in starting this.  He was given a brochure on this device and told to call if questions.

## 2016-08-08 NOTE — Patient Instructions (Signed)
Call if you are interested in starting this device.

## 2016-08-10 ENCOUNTER — Other Ambulatory Visit: Payer: Self-pay | Admitting: Internal Medicine

## 2016-08-10 ENCOUNTER — Telehealth: Payer: Self-pay | Admitting: Nutrition

## 2016-08-10 DIAGNOSIS — E1142 Type 2 diabetes mellitus with diabetic polyneuropathy: Secondary | ICD-10-CM | POA: Diagnosis not present

## 2016-08-10 DIAGNOSIS — Z7901 Long term (current) use of anticoagulants: Secondary | ICD-10-CM | POA: Diagnosis not present

## 2016-08-10 DIAGNOSIS — I129 Hypertensive chronic kidney disease with stage 1 through stage 4 chronic kidney disease, or unspecified chronic kidney disease: Secondary | ICD-10-CM | POA: Diagnosis not present

## 2016-08-10 DIAGNOSIS — L97522 Non-pressure chronic ulcer of other part of left foot with fat layer exposed: Secondary | ICD-10-CM | POA: Diagnosis not present

## 2016-08-10 DIAGNOSIS — N183 Chronic kidney disease, stage 3 (moderate): Secondary | ICD-10-CM | POA: Diagnosis not present

## 2016-08-10 DIAGNOSIS — E11621 Type 2 diabetes mellitus with foot ulcer: Secondary | ICD-10-CM | POA: Diagnosis not present

## 2016-08-10 DIAGNOSIS — Z79891 Long term (current) use of opiate analgesic: Secondary | ICD-10-CM | POA: Diagnosis not present

## 2016-08-10 DIAGNOSIS — E1122 Type 2 diabetes mellitus with diabetic chronic kidney disease: Secondary | ICD-10-CM | POA: Diagnosis not present

## 2016-08-10 DIAGNOSIS — Z4800 Encounter for change or removal of nonsurgical wound dressing: Secondary | ICD-10-CM | POA: Diagnosis not present

## 2016-08-10 DIAGNOSIS — Z794 Long term (current) use of insulin: Secondary | ICD-10-CM | POA: Diagnosis not present

## 2016-08-10 DIAGNOSIS — E11311 Type 2 diabetes mellitus with unspecified diabetic retinopathy with macular edema: Secondary | ICD-10-CM | POA: Diagnosis not present

## 2016-08-10 NOTE — Telephone Encounter (Signed)
[name] called saying that he heard from [facility], and it is "way too expensive for the V-Gos.  He can not afford this.

## 2016-08-10 NOTE — Telephone Encounter (Signed)
Noted, continue current injections

## 2016-08-11 ENCOUNTER — Encounter: Payer: Self-pay | Admitting: Sports Medicine

## 2016-08-11 ENCOUNTER — Ambulatory Visit (INDEPENDENT_AMBULATORY_CARE_PROVIDER_SITE_OTHER): Payer: Medicare Other | Admitting: Sports Medicine

## 2016-08-11 DIAGNOSIS — Z9889 Other specified postprocedural states: Secondary | ICD-10-CM

## 2016-08-11 DIAGNOSIS — L97522 Non-pressure chronic ulcer of other part of left foot with fat layer exposed: Secondary | ICD-10-CM

## 2016-08-11 DIAGNOSIS — Z89432 Acquired absence of left foot: Secondary | ICD-10-CM

## 2016-08-11 DIAGNOSIS — IMO0002 Reserved for concepts with insufficient information to code with codable children: Secondary | ICD-10-CM

## 2016-08-11 DIAGNOSIS — E11621 Type 2 diabetes mellitus with foot ulcer: Secondary | ICD-10-CM

## 2016-08-11 DIAGNOSIS — E1140 Type 2 diabetes mellitus with diabetic neuropathy, unspecified: Secondary | ICD-10-CM

## 2016-08-11 NOTE — Progress Notes (Signed)
Subjective: [name] is a 65 y.o. male patient seen today in office for POV #8 (DOS 06-15-16), S/P Left wound debridement and placement of stravix allograft. Patient had endoform placed last visit, denies pain at surgical site, denies calf pain, denies headache, chest pain, shortness of breath, nausea, vomiting, fever, or chills. Patient states that he is doing well with no issues. Has home nursing every other day. No other issues noted.   Patient Active Problem List   Diagnosis Date Noted  . Chronic pain syndrome 09/18/2015  . Preventative health care 03/12/2015  . Family history of coronary artery disease in father 12/14/2014  . Acute osteomyelitis of toe of left foot ([place])   . Foot ulcer, left ([place]) 12/13/2014  . Paroxysmal atrial fibrillation, new onset 12/13/2014  . Elevated alkaline phosphatase level 06/11/2014  . Constipation 05/31/2014  . Vitamin D deficiency 11/17/2013  . Mild memory disturbance 07/27/2012  . Anemia 02/10/2012  . Hypertension 11/18/2011  . Wheezing 06/08/2011  . Obstructive sleep apnea 12/12/2009  . EDEMA 02/13/2009  . GERD 06/27/2008  . Diabetes type 2, uncontrolled ([place]) 12/28/2007  . DIABETIC MACULAR EDEMA 10/07/2007  . Dyslipidemia 08/15/2007  . BACKGROUND DIABETIC RETINOPATHY 08/15/2007  . ERECTILE DYSFUNCTION 05/31/2007  . HEARING LOSS, SENSORINEURAL, BILATERAL 01/03/2007  . Diabetic peripheral neuropathy associated with type 2 diabetes mellitus ([place]) 12/16/2006  . Chronic kidney disease, stage III (moderate) 12/01/2006  . Obesity BMI 46 08/23/2006  . STATUS, OTHER TOE(S) AMPUTATION 05/21/2006    Current Outpatient Prescriptions on File Prior to Visit  Medication Sig Dispense Refill  . ACCU-CHEK AVIVA PLUS test strip USE TO CHECK BLOOD SUGAR FOUR TIMES DAILY BEFORE MEALS AND AT BEDTIME 150 each 3  . ACCU-CHEK FASTCLIX LANCETS MISC Check blood sugar 4 times a day before meals and bedtime dx code 250.02 insulin requiring 102 each 6  .  atorvastatin (LIPITOR) 20 MG tablet TAKE 1 TABLET(20 MG) BY MOUTH DAILY 90 tablet 3  . BESIVANCE 0.6 % SUSP Place 1 drop into both eyes 4 (four) times daily.   12  . Blood Glucose Monitoring Suppl (ACCU-CHEK AVIVA PLUS) W/DEVICE KIT 1 each by Does not apply route 4 (four) times daily -  with meals and at bedtime. 1 kit 0  . Cholecalciferol (VITAMIN D3) 2000 UNITS capsule Take 1 capsule (2,000 Units total) by mouth daily. 90 capsule 0  . ciprofloxacin (CIPRO) 500 MG tablet Take 1 tablet (500 mg total) by mouth 2 (two) times daily. 20 tablet 0  . diclofenac sodium (VOLTAREN) 1 % GEL Apply 4 g topically 4 (four) times daily. 100 g 2  . docusate sodium (COLACE) 100 MG capsule Take 1 capsule (100 mg total) by mouth daily as needed for mild constipation. 14 capsule 0  . ELIQUIS 5 MG TABS tablet TAKE 1 TABLET(5 MG) BY MOUTH TWICE DAILY 180 tablet 0  . enalapril (VASOTEC) 20 MG tablet TAKE 2 TABLETS(40 MG) BY MOUTH DAILY 180 tablet 1  . furosemide (LASIX) 40 MG tablet TAKE 1 TABLET(40 MG) BY MOUTH TWICE DAILY 180 tablet 1  . HUMULIN R U-500 KWIKPEN 500 UNIT/ML kwikpen INJECT 20 UNITS AT BREAKFAST, 60 UNITS AT LUNCH, AND 70 UNITS AT DINNER 18 mL 0  . HYDROcodone-acetaminophen (NORCO) 7.5-325 MG tablet Take 1 tablet by mouth every 6 (six) hours as needed for moderate pain. 133 tablet 0  . Insulin Glargine (TOUJEO SOLOSTAR) 300 UNIT/ML SOPN Inject 60-90 Units into the skin 2 (two) times daily. (Patient taking differently:  Inject 70-80 Units into the skin 2 (two) times daily. ) 45 pen 2  . Insulin Pen Needle 31G X 5 MM MISC Use for insulin injection 5 times a day. 150 each 5  . LUMIGAN 0.01 % SOLN Place 1 drop into both eyes at bedtime.     . metoprolol succinate (TOPROL-XL) 25 MG 24 hr tablet Take 1 tablet (25 mg total) by mouth daily. 90 tablet 3  . omeprazole (PRILOSEC) 20 MG capsule TAKE 1 CAPSULE(20 MG) BY MOUTH DAILY 90 capsule 1  . pregabalin (LYRICA) 25 MG capsule Take 1 capsule (25 mg total) by mouth 3  (three) times daily. 90 capsule 2  . PROAIR HFA 108 (90 Base) MCG/ACT inhaler INHALE 2 PUFFS INTO THE LUNGS BY MOUTH EVERY 6 HOURS AS NEEDED FOR WHEEZING OR SHORTNESS OF BREATH 8.5 g 0  . promethazine (PHENERGAN) 25 MG tablet Take 1 tablet (25 mg total) by mouth every 8 (eight) hours as needed for nausea or vomiting. 30 tablet 2  . SIMBRINZA 1-0.2 % SUSP Place 1 drop into both eyes 2 (two) times daily. Take as instructed by Dr. Katy Fitch.    Marland Kitchen VICTOZA 18 MG/3ML SOPN INJECT 1.8 MG ONCE DAILY AT THE SAME TIME 9 mL 3   No current facility-administered medications on file prior to visit.     Allergies  Allergen Reactions  . Vancomycin     REACTION: ARF    Objective: There were no vitals filed for this visit.  General: No acute distress, AAOx3  Left foot: Plantar forefoot, wound graft fully incorporated with granular wound bed measuring 3.5x3x0.2cm same as previous, mild periwound maceration, swelling to left foot, no erythema, no warmth, no active drainage however on inner guaze there is drainage, no acute signs of infection noted, Capillary fill time <3 seconds in all digits remaining, amputation status of 2nd and 3rd toes. No pain with calf compression.   Assessment and Plan:  Problem List Items Addressed This Visit    None    Visit Diagnoses    Diabetic ulcer of left foot associated with type 2 diabetes mellitus, with fat layer exposed, unspecified part of foot ([place])    -  Primary   Foot amputation status, left ([place])       Type 2 diabetes mellitus with diabetic neuropathy, unspecified long term insulin use status (HCC)       S/P foot surgery, left           -Patient seen and evaluated -Cleansed wound applied Endoform fenestrated 2x2 dermal dressing and adaptic non adherant layer secured and dry sterile dressing to surgical site left foot secured with ACE wrap and stockinet  -Advised patient to make sure to keep dressings clean, dry, and intact allowing nursing to change dressings  consisting of algisite or nonaherant dressing to the center at the wound covered with sterile dressings -Advised patient to continue with forefoot offloading post-op shoe on left foot with use of cane -Advised patient to limit activity to necessity  -Advised patient to ice and elevate as necessary  -Cipro completed -Will plan for possible re-application of Endoform or [place] to wound bed at next visit. In the meantime, patient to call office if any issues or problems arise.   Landis Martins, DPM

## 2016-08-12 DIAGNOSIS — E11621 Type 2 diabetes mellitus with foot ulcer: Secondary | ICD-10-CM | POA: Diagnosis not present

## 2016-08-12 DIAGNOSIS — E1122 Type 2 diabetes mellitus with diabetic chronic kidney disease: Secondary | ICD-10-CM | POA: Diagnosis not present

## 2016-08-12 DIAGNOSIS — L97522 Non-pressure chronic ulcer of other part of left foot with fat layer exposed: Secondary | ICD-10-CM | POA: Diagnosis not present

## 2016-08-12 DIAGNOSIS — Z794 Long term (current) use of insulin: Secondary | ICD-10-CM | POA: Diagnosis not present

## 2016-08-12 DIAGNOSIS — Z7901 Long term (current) use of anticoagulants: Secondary | ICD-10-CM | POA: Diagnosis not present

## 2016-08-12 DIAGNOSIS — E1142 Type 2 diabetes mellitus with diabetic polyneuropathy: Secondary | ICD-10-CM | POA: Diagnosis not present

## 2016-08-12 DIAGNOSIS — N183 Chronic kidney disease, stage 3 (moderate): Secondary | ICD-10-CM | POA: Diagnosis not present

## 2016-08-12 DIAGNOSIS — I129 Hypertensive chronic kidney disease with stage 1 through stage 4 chronic kidney disease, or unspecified chronic kidney disease: Secondary | ICD-10-CM | POA: Diagnosis not present

## 2016-08-12 DIAGNOSIS — E11311 Type 2 diabetes mellitus with unspecified diabetic retinopathy with macular edema: Secondary | ICD-10-CM | POA: Diagnosis not present

## 2016-08-12 DIAGNOSIS — Z4800 Encounter for change or removal of nonsurgical wound dressing: Secondary | ICD-10-CM | POA: Diagnosis not present

## 2016-08-12 DIAGNOSIS — Z79891 Long term (current) use of opiate analgesic: Secondary | ICD-10-CM | POA: Diagnosis not present

## 2016-08-13 ENCOUNTER — Other Ambulatory Visit: Payer: Self-pay

## 2016-08-13 ENCOUNTER — Other Ambulatory Visit: Payer: Self-pay | Admitting: Endocrinology

## 2016-08-13 MED ORDER — VICTOZA 18 MG/3ML ~~LOC~~ SOPN: PEN_INJECTOR | SUBCUTANEOUS | 0 refills | Status: DC

## 2016-08-13 NOTE — Telephone Encounter (Signed)
Ordered 08/13/16 

## 2016-08-13 NOTE — Telephone Encounter (Signed)
Pt called in requesting refill of his Victoza sent to the [facility] on [address].

## 2016-08-14 ENCOUNTER — Telehealth: Payer: Self-pay | Admitting: *Deleted

## 2016-08-14 DIAGNOSIS — Z7901 Long term (current) use of anticoagulants: Secondary | ICD-10-CM | POA: Diagnosis not present

## 2016-08-14 DIAGNOSIS — E11621 Type 2 diabetes mellitus with foot ulcer: Secondary | ICD-10-CM | POA: Diagnosis not present

## 2016-08-14 DIAGNOSIS — E11311 Type 2 diabetes mellitus with unspecified diabetic retinopathy with macular edema: Secondary | ICD-10-CM | POA: Diagnosis not present

## 2016-08-14 DIAGNOSIS — L97522 Non-pressure chronic ulcer of other part of left foot with fat layer exposed: Secondary | ICD-10-CM | POA: Diagnosis not present

## 2016-08-14 DIAGNOSIS — E1122 Type 2 diabetes mellitus with diabetic chronic kidney disease: Secondary | ICD-10-CM | POA: Diagnosis not present

## 2016-08-14 DIAGNOSIS — Z794 Long term (current) use of insulin: Secondary | ICD-10-CM | POA: Diagnosis not present

## 2016-08-14 DIAGNOSIS — Z4800 Encounter for change or removal of nonsurgical wound dressing: Secondary | ICD-10-CM | POA: Diagnosis not present

## 2016-08-14 DIAGNOSIS — E1142 Type 2 diabetes mellitus with diabetic polyneuropathy: Secondary | ICD-10-CM | POA: Diagnosis not present

## 2016-08-14 DIAGNOSIS — I129 Hypertensive chronic kidney disease with stage 1 through stage 4 chronic kidney disease, or unspecified chronic kidney disease: Secondary | ICD-10-CM | POA: Diagnosis not present

## 2016-08-14 DIAGNOSIS — N183 Chronic kidney disease, stage 3 (moderate): Secondary | ICD-10-CM | POA: Diagnosis not present

## 2016-08-14 DIAGNOSIS — Z79891 Long term (current) use of opiate analgesic: Secondary | ICD-10-CM | POA: Diagnosis not present

## 2016-08-14 NOTE — Telephone Encounter (Signed)
[name] is a 66 y.o. male who was contacted on behalf of [facility] Task Force.   Diabetes Assessment  DM meds and BS checks -  "What medications are you taking for diabetes?" all the ones im suppose to -  "How often are you checking your blood sugars at home?" about 3 times day - "What have your blood sugars been?" 150 and up  High Blood Pressure Assessment  BP meds & BP checks -  "What medications are you taking for high blood pressure?" I cant remember their names - "How often are you checking your blood pressure at home?" I dont - "What have your blood pressure readings been?" 130/85  Coping with DM and BP - What else are you doing to help yourself with your DM and BP - diet, walk  Medication Access Issues  Medication Issues? -  "Are you getting your medicines refilled on time without skipping any doses?" yes - "Are you having any problems getting/taking your medicines?   transportation   Conclusion  Close the call - "Do you have your next appointment scheduled? - give pt date and time if needed (make appt for them if needed before calling) yes - "Any other questions or concerns?" no - "Thanks for speaking with me today"   -  No answer

## 2016-08-17 DIAGNOSIS — Z7901 Long term (current) use of anticoagulants: Secondary | ICD-10-CM | POA: Diagnosis not present

## 2016-08-17 DIAGNOSIS — Z79891 Long term (current) use of opiate analgesic: Secondary | ICD-10-CM | POA: Diagnosis not present

## 2016-08-17 DIAGNOSIS — L97522 Non-pressure chronic ulcer of other part of left foot with fat layer exposed: Secondary | ICD-10-CM | POA: Diagnosis not present

## 2016-08-17 DIAGNOSIS — E1122 Type 2 diabetes mellitus with diabetic chronic kidney disease: Secondary | ICD-10-CM | POA: Diagnosis not present

## 2016-08-17 DIAGNOSIS — E11311 Type 2 diabetes mellitus with unspecified diabetic retinopathy with macular edema: Secondary | ICD-10-CM | POA: Diagnosis not present

## 2016-08-17 DIAGNOSIS — G4733 Obstructive sleep apnea (adult) (pediatric): Secondary | ICD-10-CM | POA: Diagnosis not present

## 2016-08-17 DIAGNOSIS — E1142 Type 2 diabetes mellitus with diabetic polyneuropathy: Secondary | ICD-10-CM | POA: Diagnosis not present

## 2016-08-17 DIAGNOSIS — Z794 Long term (current) use of insulin: Secondary | ICD-10-CM | POA: Diagnosis not present

## 2016-08-17 DIAGNOSIS — N183 Chronic kidney disease, stage 3 (moderate): Secondary | ICD-10-CM | POA: Diagnosis not present

## 2016-08-17 DIAGNOSIS — E11621 Type 2 diabetes mellitus with foot ulcer: Secondary | ICD-10-CM | POA: Diagnosis not present

## 2016-08-17 DIAGNOSIS — I129 Hypertensive chronic kidney disease with stage 1 through stage 4 chronic kidney disease, or unspecified chronic kidney disease: Secondary | ICD-10-CM | POA: Diagnosis not present

## 2016-08-17 DIAGNOSIS — Z4800 Encounter for change or removal of nonsurgical wound dressing: Secondary | ICD-10-CM | POA: Diagnosis not present

## 2016-08-18 ENCOUNTER — Ambulatory Visit: Payer: Medicare Other | Admitting: Internal Medicine

## 2016-08-18 ENCOUNTER — Telehealth: Payer: Self-pay | Admitting: Sports Medicine

## 2016-08-18 ENCOUNTER — Ambulatory Visit: Payer: Medicare Other | Admitting: Sports Medicine

## 2016-08-18 NOTE — Telephone Encounter (Signed)
Pt called and cxled appt for 2.6.18 due to transportation issues.Rescheduled for 2.7.18 to see Dr Amalia Hailey.  Per Dr Cannon Kettle pt can wait to see her at his appt on 2.13.18 since he does have a home health nurse visiting he several times a week and if nurse has any problems she can call Dr Cannon Kettle. I told pt this and we cxled his appt on 2.7.18. Pt aware of appt time next week

## 2016-08-19 ENCOUNTER — Ambulatory Visit: Payer: Medicare Other | Admitting: Podiatry

## 2016-08-19 NOTE — Progress Notes (Signed)
DOS 12.04.2017 Left foot wound debridement and placement of stravix allograft

## 2016-08-20 DIAGNOSIS — E11621 Type 2 diabetes mellitus with foot ulcer: Secondary | ICD-10-CM | POA: Diagnosis not present

## 2016-08-20 DIAGNOSIS — Z4800 Encounter for change or removal of nonsurgical wound dressing: Secondary | ICD-10-CM | POA: Diagnosis not present

## 2016-08-20 DIAGNOSIS — Z79891 Long term (current) use of opiate analgesic: Secondary | ICD-10-CM | POA: Diagnosis not present

## 2016-08-20 DIAGNOSIS — E1142 Type 2 diabetes mellitus with diabetic polyneuropathy: Secondary | ICD-10-CM | POA: Diagnosis not present

## 2016-08-20 DIAGNOSIS — Z7901 Long term (current) use of anticoagulants: Secondary | ICD-10-CM | POA: Diagnosis not present

## 2016-08-20 DIAGNOSIS — I129 Hypertensive chronic kidney disease with stage 1 through stage 4 chronic kidney disease, or unspecified chronic kidney disease: Secondary | ICD-10-CM | POA: Diagnosis not present

## 2016-08-20 DIAGNOSIS — L97522 Non-pressure chronic ulcer of other part of left foot with fat layer exposed: Secondary | ICD-10-CM | POA: Diagnosis not present

## 2016-08-20 DIAGNOSIS — E11311 Type 2 diabetes mellitus with unspecified diabetic retinopathy with macular edema: Secondary | ICD-10-CM | POA: Diagnosis not present

## 2016-08-20 DIAGNOSIS — E1122 Type 2 diabetes mellitus with diabetic chronic kidney disease: Secondary | ICD-10-CM | POA: Diagnosis not present

## 2016-08-20 DIAGNOSIS — N183 Chronic kidney disease, stage 3 (moderate): Secondary | ICD-10-CM | POA: Diagnosis not present

## 2016-08-20 DIAGNOSIS — Z794 Long term (current) use of insulin: Secondary | ICD-10-CM | POA: Diagnosis not present

## 2016-08-21 DIAGNOSIS — E11621 Type 2 diabetes mellitus with foot ulcer: Secondary | ICD-10-CM | POA: Diagnosis not present

## 2016-08-21 DIAGNOSIS — E11311 Type 2 diabetes mellitus with unspecified diabetic retinopathy with macular edema: Secondary | ICD-10-CM | POA: Diagnosis not present

## 2016-08-21 DIAGNOSIS — E1142 Type 2 diabetes mellitus with diabetic polyneuropathy: Secondary | ICD-10-CM | POA: Diagnosis not present

## 2016-08-21 DIAGNOSIS — I129 Hypertensive chronic kidney disease with stage 1 through stage 4 chronic kidney disease, or unspecified chronic kidney disease: Secondary | ICD-10-CM | POA: Diagnosis not present

## 2016-08-21 DIAGNOSIS — N183 Chronic kidney disease, stage 3 (moderate): Secondary | ICD-10-CM | POA: Diagnosis not present

## 2016-08-21 DIAGNOSIS — Z7901 Long term (current) use of anticoagulants: Secondary | ICD-10-CM | POA: Diagnosis not present

## 2016-08-21 DIAGNOSIS — Z794 Long term (current) use of insulin: Secondary | ICD-10-CM | POA: Diagnosis not present

## 2016-08-21 DIAGNOSIS — Z79891 Long term (current) use of opiate analgesic: Secondary | ICD-10-CM | POA: Diagnosis not present

## 2016-08-21 DIAGNOSIS — Z4800 Encounter for change or removal of nonsurgical wound dressing: Secondary | ICD-10-CM | POA: Diagnosis not present

## 2016-08-21 DIAGNOSIS — L97522 Non-pressure chronic ulcer of other part of left foot with fat layer exposed: Secondary | ICD-10-CM | POA: Diagnosis not present

## 2016-08-21 DIAGNOSIS — E1122 Type 2 diabetes mellitus with diabetic chronic kidney disease: Secondary | ICD-10-CM | POA: Diagnosis not present

## 2016-08-24 DIAGNOSIS — Z79891 Long term (current) use of opiate analgesic: Secondary | ICD-10-CM | POA: Diagnosis not present

## 2016-08-24 DIAGNOSIS — E11621 Type 2 diabetes mellitus with foot ulcer: Secondary | ICD-10-CM | POA: Diagnosis not present

## 2016-08-24 DIAGNOSIS — E1122 Type 2 diabetes mellitus with diabetic chronic kidney disease: Secondary | ICD-10-CM | POA: Diagnosis not present

## 2016-08-24 DIAGNOSIS — L97522 Non-pressure chronic ulcer of other part of left foot with fat layer exposed: Secondary | ICD-10-CM | POA: Diagnosis not present

## 2016-08-24 DIAGNOSIS — E1142 Type 2 diabetes mellitus with diabetic polyneuropathy: Secondary | ICD-10-CM | POA: Diagnosis not present

## 2016-08-24 DIAGNOSIS — Z794 Long term (current) use of insulin: Secondary | ICD-10-CM | POA: Diagnosis not present

## 2016-08-24 DIAGNOSIS — N183 Chronic kidney disease, stage 3 (moderate): Secondary | ICD-10-CM | POA: Diagnosis not present

## 2016-08-24 DIAGNOSIS — E11311 Type 2 diabetes mellitus with unspecified diabetic retinopathy with macular edema: Secondary | ICD-10-CM | POA: Diagnosis not present

## 2016-08-24 DIAGNOSIS — Z7901 Long term (current) use of anticoagulants: Secondary | ICD-10-CM | POA: Diagnosis not present

## 2016-08-24 DIAGNOSIS — Z4800 Encounter for change or removal of nonsurgical wound dressing: Secondary | ICD-10-CM | POA: Diagnosis not present

## 2016-08-24 DIAGNOSIS — I129 Hypertensive chronic kidney disease with stage 1 through stage 4 chronic kidney disease, or unspecified chronic kidney disease: Secondary | ICD-10-CM | POA: Diagnosis not present

## 2016-08-25 ENCOUNTER — Ambulatory Visit (INDEPENDENT_AMBULATORY_CARE_PROVIDER_SITE_OTHER): Payer: Self-pay | Admitting: Sports Medicine

## 2016-08-25 ENCOUNTER — Encounter: Payer: Self-pay | Admitting: Sports Medicine

## 2016-08-25 DIAGNOSIS — E11621 Type 2 diabetes mellitus with foot ulcer: Secondary | ICD-10-CM

## 2016-08-25 DIAGNOSIS — Z89432 Acquired absence of left foot: Secondary | ICD-10-CM

## 2016-08-25 DIAGNOSIS — IMO0002 Reserved for concepts with insufficient information to code with codable children: Secondary | ICD-10-CM

## 2016-08-25 DIAGNOSIS — E1140 Type 2 diabetes mellitus with diabetic neuropathy, unspecified: Secondary | ICD-10-CM

## 2016-08-25 DIAGNOSIS — L97522 Non-pressure chronic ulcer of other part of left foot with fat layer exposed: Secondary | ICD-10-CM

## 2016-08-25 DIAGNOSIS — Z9889 Other specified postprocedural states: Secondary | ICD-10-CM

## 2016-08-25 NOTE — Progress Notes (Signed)
Subjective: [name] is a 66 y.o. male patient seen today in office for POV #9 (DOS 06-15-16), S/P Left wound debridement and placement of stravix allograft. Patient had endoform placed last visit, denies pain at surgical site, denies calf pain, denies headache, chest pain, shortness of breath, nausea, vomiting, fever, or chills. Patient states that he is doing well with no issues. Has home nursing every other day. No other issues noted.   Patient Active Problem List   Diagnosis Date Noted  . Chronic pain syndrome 09/18/2015  . Preventative health care 03/12/2015  . Family history of coronary artery disease in father 12/14/2014  . Acute osteomyelitis of toe of left foot ([place])   . Foot ulcer, left ([place]) 12/13/2014  . Paroxysmal atrial fibrillation, new onset 12/13/2014  . Elevated alkaline phosphatase level 06/11/2014  . Constipation 05/31/2014  . Vitamin D deficiency 11/17/2013  . Mild memory disturbance 07/27/2012  . Anemia 02/10/2012  . Hypertension 11/18/2011  . Wheezing 06/08/2011  . Obstructive sleep apnea 12/12/2009  . EDEMA 02/13/2009  . GERD 06/27/2008  . Diabetes type 2, uncontrolled ([place]) 12/28/2007  . DIABETIC MACULAR EDEMA 10/07/2007  . Dyslipidemia 08/15/2007  . BACKGROUND DIABETIC RETINOPATHY 08/15/2007  . ERECTILE DYSFUNCTION 05/31/2007  . HEARING LOSS, SENSORINEURAL, BILATERAL 01/03/2007  . Diabetic peripheral neuropathy associated with type 2 diabetes mellitus ([place]) 12/16/2006  . Chronic kidney disease, stage III (moderate) 12/01/2006  . Obesity BMI 46 08/23/2006  . STATUS, OTHER TOE(S) AMPUTATION 05/21/2006    Current Outpatient Prescriptions on File Prior to Visit  Medication Sig Dispense Refill  . ACCU-CHEK AVIVA PLUS test strip USE TO CHECK BLOOD SUGAR FOUR TIMES DAILY BEFORE MEALS AND AT BEDTIME 150 each 3  . ACCU-CHEK FASTCLIX LANCETS MISC Check blood sugar 4 times a day before meals and bedtime dx code 250.02 insulin requiring 102 each 6   . atorvastatin (LIPITOR) 20 MG tablet TAKE 1 TABLET(20 MG) BY MOUTH DAILY 90 tablet 3  . BESIVANCE 0.6 % SUSP Place 1 drop into both eyes 4 (four) times daily.   12  . Blood Glucose Monitoring Suppl (ACCU-CHEK AVIVA PLUS) W/DEVICE KIT 1 each by Does not apply route 4 (four) times daily -  with meals and at bedtime. 1 kit 0  . Cholecalciferol (VITAMIN D3) 2000 UNITS capsule Take 1 capsule (2,000 Units total) by mouth daily. 90 capsule 0  . ciprofloxacin (CIPRO) 500 MG tablet Take 1 tablet (500 mg total) by mouth 2 (two) times daily. 20 tablet 0  . diclofenac sodium (VOLTAREN) 1 % GEL Apply 4 g topically 4 (four) times daily. 100 g 2  . docusate sodium (COLACE) 100 MG capsule Take 1 capsule (100 mg total) by mouth daily as needed for mild constipation. 14 capsule 0  . ELIQUIS 5 MG TABS tablet TAKE 1 TABLET(5 MG) BY MOUTH TWICE DAILY 180 tablet 0  . enalapril (VASOTEC) 20 MG tablet TAKE 2 TABLETS(40 MG) BY MOUTH DAILY 180 tablet 1  . furosemide (LASIX) 40 MG tablet TAKE 1 TABLET(40 MG) BY MOUTH TWICE DAILY 180 tablet 1  . HUMULIN R U-500 KWIKPEN 500 UNIT/ML kwikpen INJECT 20 UNITS AT BREAKFAST, 60 UNITS AT LUNCH, AND 70 UNITS AT DINNER 18 mL 0  . HYDROcodone-acetaminophen (NORCO) 7.5-325 MG tablet Take 1 tablet by mouth every 6 (six) hours as needed for moderate pain. 133 tablet 0  . Insulin Glargine (TOUJEO SOLOSTAR) 300 UNIT/ML SOPN Inject 60-90 Units into the skin 2 (two) times daily. (Patient  taking differently: Inject 70-80 Units into the skin 2 (two) times daily. ) 45 pen 2  . Insulin Pen Needle 31G X 5 MM MISC Use for insulin injection 5 times a day. 150 each 5  . LUMIGAN 0.01 % SOLN Place 1 drop into both eyes at bedtime.     . metoprolol succinate (TOPROL-XL) 25 MG 24 hr tablet Take 1 tablet (25 mg total) by mouth daily. 90 tablet 3  . omeprazole (PRILOSEC) 20 MG capsule TAKE 1 CAPSULE(20 MG) BY MOUTH DAILY 90 capsule 1  . pregabalin (LYRICA) 25 MG capsule Take 1 capsule (25 mg total) by  mouth 3 (three) times daily. 90 capsule 2  . PROAIR HFA 108 (90 Base) MCG/ACT inhaler INHALE 2 PUFFS INTO THE LUNGS BY MOUTH EVERY 6 HOURS AS NEEDED FOR WHEEZING OR SHORTNESS OF BREATH 8.5 g 0  . promethazine (PHENERGAN) 25 MG tablet Take 1 tablet (25 mg total) by mouth every 8 (eight) hours as needed for nausea or vomiting. 30 tablet 2  . SIMBRINZA 1-0.2 % SUSP Place 1 drop into both eyes 2 (two) times daily. Take as instructed by Dr. Katy Fitch.    Marland Kitchen VICTOZA 18 MG/3ML SOPN INJECT 1.8 MG ONCE DAILY AT THE SAME TIME 9 mL 0   No current facility-administered medications on file prior to visit.     Allergies  Allergen Reactions  . Vancomycin     REACTION: ARF    Objective: There were no vitals filed for this visit.  General: No acute distress, AAOx3  Left foot: Plantar forefoot, wound graft fully incorporated with granular wound bed measuring 3.5x5x0.2cm larger than previous, mild periwound maceration, swelling to left foot, no erythema, no warmth, no active drainage however on inner guaze there is drainage, no acute signs of infection noted, Capillary fill time <3 seconds in all digits remaining, amputation status of 2nd and 3rd toes. No pain with calf compression.   Assessment and Plan:  Problem List Items Addressed This Visit    None    Visit Diagnoses    Diabetic ulcer of left foot associated with type 2 diabetes mellitus, with fat layer exposed, unspecified part of foot ([place])    -  Primary   Foot amputation status, left ([place])       Type 2 diabetes mellitus with diabetic neuropathy, unspecified long term insulin use status (HCC)       S/P foot surgery, left           -Patient seen and evaluated -Cleansed wound applied Endoform fenestrated 2x2 dermal dressing and adaptic non adherant layer secured and dry sterile dressing to surgical site left foot secured with ACE wrap and stockinet  -Advised patient to make sure to keep dressings clean, dry, and intact allowing nursing to change  dressings consisting of algisite or nonaherant dressing to the center at the wound covered with sterile dressings -Advised patient to continue with forefoot offloading post-op shoe on left foot with use of cane -Advised patient to limit activity to necessity  -Advised patient to ice and elevate as necessary  -Cipro completed -Will plan for possible re-application of Endoform to wound bed at next visit. In the meantime, patient to call office if any issues or problems arise.   Landis Martins, DPM

## 2016-08-26 DIAGNOSIS — Z7901 Long term (current) use of anticoagulants: Secondary | ICD-10-CM | POA: Diagnosis not present

## 2016-08-26 DIAGNOSIS — E11311 Type 2 diabetes mellitus with unspecified diabetic retinopathy with macular edema: Secondary | ICD-10-CM | POA: Diagnosis not present

## 2016-08-26 DIAGNOSIS — Z4800 Encounter for change or removal of nonsurgical wound dressing: Secondary | ICD-10-CM | POA: Diagnosis not present

## 2016-08-26 DIAGNOSIS — Z794 Long term (current) use of insulin: Secondary | ICD-10-CM | POA: Diagnosis not present

## 2016-08-26 DIAGNOSIS — N183 Chronic kidney disease, stage 3 (moderate): Secondary | ICD-10-CM | POA: Diagnosis not present

## 2016-08-26 DIAGNOSIS — E1142 Type 2 diabetes mellitus with diabetic polyneuropathy: Secondary | ICD-10-CM | POA: Diagnosis not present

## 2016-08-26 DIAGNOSIS — E1122 Type 2 diabetes mellitus with diabetic chronic kidney disease: Secondary | ICD-10-CM | POA: Diagnosis not present

## 2016-08-26 DIAGNOSIS — I129 Hypertensive chronic kidney disease with stage 1 through stage 4 chronic kidney disease, or unspecified chronic kidney disease: Secondary | ICD-10-CM | POA: Diagnosis not present

## 2016-08-26 DIAGNOSIS — E11621 Type 2 diabetes mellitus with foot ulcer: Secondary | ICD-10-CM | POA: Diagnosis not present

## 2016-08-26 DIAGNOSIS — L97522 Non-pressure chronic ulcer of other part of left foot with fat layer exposed: Secondary | ICD-10-CM | POA: Diagnosis not present

## 2016-08-26 DIAGNOSIS — Z79891 Long term (current) use of opiate analgesic: Secondary | ICD-10-CM | POA: Diagnosis not present

## 2016-08-28 ENCOUNTER — Other Ambulatory Visit: Payer: Self-pay

## 2016-08-28 DIAGNOSIS — G894 Chronic pain syndrome: Secondary | ICD-10-CM

## 2016-08-28 DIAGNOSIS — E1122 Type 2 diabetes mellitus with diabetic chronic kidney disease: Secondary | ICD-10-CM | POA: Diagnosis not present

## 2016-08-28 DIAGNOSIS — Z7901 Long term (current) use of anticoagulants: Secondary | ICD-10-CM | POA: Diagnosis not present

## 2016-08-28 DIAGNOSIS — E11311 Type 2 diabetes mellitus with unspecified diabetic retinopathy with macular edema: Secondary | ICD-10-CM | POA: Diagnosis not present

## 2016-08-28 DIAGNOSIS — E11621 Type 2 diabetes mellitus with foot ulcer: Secondary | ICD-10-CM | POA: Diagnosis not present

## 2016-08-28 DIAGNOSIS — I129 Hypertensive chronic kidney disease with stage 1 through stage 4 chronic kidney disease, or unspecified chronic kidney disease: Secondary | ICD-10-CM | POA: Diagnosis not present

## 2016-08-28 DIAGNOSIS — Z4800 Encounter for change or removal of nonsurgical wound dressing: Secondary | ICD-10-CM | POA: Diagnosis not present

## 2016-08-28 DIAGNOSIS — L97522 Non-pressure chronic ulcer of other part of left foot with fat layer exposed: Secondary | ICD-10-CM | POA: Diagnosis not present

## 2016-08-28 DIAGNOSIS — Z794 Long term (current) use of insulin: Secondary | ICD-10-CM | POA: Diagnosis not present

## 2016-08-28 DIAGNOSIS — E1142 Type 2 diabetes mellitus with diabetic polyneuropathy: Secondary | ICD-10-CM | POA: Diagnosis not present

## 2016-08-28 DIAGNOSIS — N183 Chronic kidney disease, stage 3 (moderate): Secondary | ICD-10-CM | POA: Diagnosis not present

## 2016-08-28 DIAGNOSIS — Z79891 Long term (current) use of opiate analgesic: Secondary | ICD-10-CM | POA: Diagnosis not present

## 2016-08-28 NOTE — Telephone Encounter (Signed)
HYDROcodone-acetaminophen (NORCO) 7.5-325 MG tablet, refill request.  

## 2016-08-31 DIAGNOSIS — Z4800 Encounter for change or removal of nonsurgical wound dressing: Secondary | ICD-10-CM | POA: Diagnosis not present

## 2016-08-31 DIAGNOSIS — N183 Chronic kidney disease, stage 3 (moderate): Secondary | ICD-10-CM | POA: Diagnosis not present

## 2016-08-31 DIAGNOSIS — Z7901 Long term (current) use of anticoagulants: Secondary | ICD-10-CM | POA: Diagnosis not present

## 2016-08-31 DIAGNOSIS — Z79891 Long term (current) use of opiate analgesic: Secondary | ICD-10-CM | POA: Diagnosis not present

## 2016-08-31 DIAGNOSIS — L97522 Non-pressure chronic ulcer of other part of left foot with fat layer exposed: Secondary | ICD-10-CM | POA: Diagnosis not present

## 2016-08-31 DIAGNOSIS — E1122 Type 2 diabetes mellitus with diabetic chronic kidney disease: Secondary | ICD-10-CM | POA: Diagnosis not present

## 2016-08-31 DIAGNOSIS — I129 Hypertensive chronic kidney disease with stage 1 through stage 4 chronic kidney disease, or unspecified chronic kidney disease: Secondary | ICD-10-CM | POA: Diagnosis not present

## 2016-08-31 DIAGNOSIS — Z794 Long term (current) use of insulin: Secondary | ICD-10-CM | POA: Diagnosis not present

## 2016-08-31 DIAGNOSIS — E1142 Type 2 diabetes mellitus with diabetic polyneuropathy: Secondary | ICD-10-CM | POA: Diagnosis not present

## 2016-08-31 DIAGNOSIS — E11311 Type 2 diabetes mellitus with unspecified diabetic retinopathy with macular edema: Secondary | ICD-10-CM | POA: Diagnosis not present

## 2016-08-31 DIAGNOSIS — E11621 Type 2 diabetes mellitus with foot ulcer: Secondary | ICD-10-CM | POA: Diagnosis not present

## 2016-08-31 MED ORDER — HYDROCODONE-ACETAMINOPHEN 7.5-325 MG PO TABS: 1.0000 | ORAL_TABLET | Freq: Four times a day (QID) | ORAL | 0 refills | Status: DC | PRN

## 2016-09-01 ENCOUNTER — Encounter: Payer: Self-pay | Admitting: Sports Medicine

## 2016-09-01 ENCOUNTER — Telehealth: Payer: Self-pay | Admitting: *Deleted

## 2016-09-01 ENCOUNTER — Ambulatory Visit (INDEPENDENT_AMBULATORY_CARE_PROVIDER_SITE_OTHER): Payer: Medicare Other | Admitting: Sports Medicine

## 2016-09-01 DIAGNOSIS — Z89432 Acquired absence of left foot: Secondary | ICD-10-CM

## 2016-09-01 DIAGNOSIS — Z9889 Other specified postprocedural states: Secondary | ICD-10-CM | POA: Diagnosis not present

## 2016-09-01 DIAGNOSIS — E1140 Type 2 diabetes mellitus with diabetic neuropathy, unspecified: Secondary | ICD-10-CM

## 2016-09-01 DIAGNOSIS — E11621 Type 2 diabetes mellitus with foot ulcer: Secondary | ICD-10-CM

## 2016-09-01 DIAGNOSIS — IMO0002 Reserved for concepts with insufficient information to code with codable children: Secondary | ICD-10-CM

## 2016-09-01 DIAGNOSIS — L97522 Non-pressure chronic ulcer of other part of left foot with fat layer exposed: Secondary | ICD-10-CM | POA: Diagnosis not present

## 2016-09-01 NOTE — Progress Notes (Signed)
Subjective: [name] is a 66 y.o. male patient seen today in office for POV #10 (DOS 06-15-16), S/P Left wound debridement and placement of stravix allograft. Patient had endoform placed last visit, denies pain at surgical site, denies calf pain, denies headache, chest pain, shortness of breath, nausea, vomiting, fever, or chills. Patient states that he is doing well with no issues. Has home nursing every other day. No other issues noted.   Patient Active Problem List   Diagnosis Date Noted  . Chronic pain syndrome 09/18/2015  . Preventative health care 03/12/2015  . Family history of coronary artery disease in father 12/14/2014  . Acute osteomyelitis of toe of left foot ([place])   . Foot ulcer, left ([place]) 12/13/2014  . Paroxysmal atrial fibrillation, new onset 12/13/2014  . Elevated alkaline phosphatase level 06/11/2014  . Constipation 05/31/2014  . Vitamin D deficiency 11/17/2013  . Mild memory disturbance 07/27/2012  . Anemia 02/10/2012  . Hypertension 11/18/2011  . Wheezing 06/08/2011  . Obstructive sleep apnea 12/12/2009  . EDEMA 02/13/2009  . GERD 06/27/2008  . Diabetes type 2, uncontrolled ([place]) 12/28/2007  . DIABETIC MACULAR EDEMA 10/07/2007  . Dyslipidemia 08/15/2007  . BACKGROUND DIABETIC RETINOPATHY 08/15/2007  . ERECTILE DYSFUNCTION 05/31/2007  . HEARING LOSS, SENSORINEURAL, BILATERAL 01/03/2007  . Diabetic peripheral neuropathy associated with type 2 diabetes mellitus ([place]) 12/16/2006  . Chronic kidney disease, stage III (moderate) 12/01/2006  . Obesity BMI 46 08/23/2006  . STATUS, OTHER TOE(S) AMPUTATION 05/21/2006    Current Outpatient Prescriptions on File Prior to Visit  Medication Sig Dispense Refill  . ACCU-CHEK AVIVA PLUS test strip USE TO CHECK BLOOD SUGAR FOUR TIMES DAILY BEFORE MEALS AND AT BEDTIME 150 each 3  . ACCU-CHEK FASTCLIX LANCETS MISC Check blood sugar 4 times a day before meals and bedtime dx code 250.02 insulin requiring 102 each 6   . atorvastatin (LIPITOR) 20 MG tablet TAKE 1 TABLET(20 MG) BY MOUTH DAILY 90 tablet 3  . BESIVANCE 0.6 % SUSP Place 1 drop into both eyes 4 (four) times daily.   12  . Blood Glucose Monitoring Suppl (ACCU-CHEK AVIVA PLUS) W/DEVICE KIT 1 each by Does not apply route 4 (four) times daily -  with meals and at bedtime. 1 kit 0  . Cholecalciferol (VITAMIN D3) 2000 UNITS capsule Take 1 capsule (2,000 Units total) by mouth daily. 90 capsule 0  . ciprofloxacin (CIPRO) 500 MG tablet Take 1 tablet (500 mg total) by mouth 2 (two) times daily. 20 tablet 0  . diclofenac sodium (VOLTAREN) 1 % GEL Apply 4 g topically 4 (four) times daily. 100 g 2  . docusate sodium (COLACE) 100 MG capsule Take 1 capsule (100 mg total) by mouth daily as needed for mild constipation. 14 capsule 0  . ELIQUIS 5 MG TABS tablet TAKE 1 TABLET(5 MG) BY MOUTH TWICE DAILY 180 tablet 0  . enalapril (VASOTEC) 20 MG tablet TAKE 2 TABLETS(40 MG) BY MOUTH DAILY 180 tablet 1  . furosemide (LASIX) 40 MG tablet TAKE 1 TABLET(40 MG) BY MOUTH TWICE DAILY 180 tablet 1  . HUMULIN R U-500 KWIKPEN 500 UNIT/ML kwikpen INJECT 20 UNITS AT BREAKFAST, 60 UNITS AT LUNCH, AND 70 UNITS AT DINNER 18 mL 0  . HYDROcodone-acetaminophen (NORCO) 7.5-325 MG tablet Take 1 tablet by mouth every 6 (six) hours as needed for moderate pain. 130 tablet 0  . Insulin Glargine (TOUJEO SOLOSTAR) 300 UNIT/ML SOPN Inject 60-90 Units into the skin 2 (two) times daily. (Patient  taking differently: Inject 70-80 Units into the skin 2 (two) times daily. ) 45 pen 2  . Insulin Pen Needle 31G X 5 MM MISC Use for insulin injection 5 times a day. 150 each 5  . LUMIGAN 0.01 % SOLN Place 1 drop into both eyes at bedtime.     . metoprolol succinate (TOPROL-XL) 25 MG 24 hr tablet Take 1 tablet (25 mg total) by mouth daily. 90 tablet 3  . omeprazole (PRILOSEC) 20 MG capsule TAKE 1 CAPSULE(20 MG) BY MOUTH DAILY 90 capsule 1  . pregabalin (LYRICA) 25 MG capsule Take 1 capsule (25 mg total) by  mouth 3 (three) times daily. 90 capsule 2  . PROAIR HFA 108 (90 Base) MCG/ACT inhaler INHALE 2 PUFFS INTO THE LUNGS BY MOUTH EVERY 6 HOURS AS NEEDED FOR WHEEZING OR SHORTNESS OF BREATH 8.5 g 0  . promethazine (PHENERGAN) 25 MG tablet Take 1 tablet (25 mg total) by mouth every 8 (eight) hours as needed for nausea or vomiting. 30 tablet 2  . SIMBRINZA 1-0.2 % SUSP Place 1 drop into both eyes 2 (two) times daily. Take as instructed by Dr. Katy Fitch.    Marland Kitchen VICTOZA 18 MG/3ML SOPN INJECT 1.8 MG ONCE DAILY AT THE SAME TIME 9 mL 0   No current facility-administered medications on file prior to visit.     Allergies  Allergen Reactions  . Vancomycin     REACTION: ARF    Objective: There were no vitals filed for this visit.  General: No acute distress, AAOx3  Left foot: Plantar forefoot, wound graft fully incorporated with granular wound bed measuring 3x4x0.2cm slightly smaller with mild periwound maceration, swelling to left foot, no erythema, no warmth, no active drainage however on inner guaze there is drainage, no acute signs of infection noted, Capillary fill time <3 seconds in all digits remaining, amputation status of 2nd and 3rd toes. No pain with calf compression.   Assessment and Plan:  Problem List Items Addressed This Visit    None    Visit Diagnoses    Diabetic ulcer of left foot associated with type 2 diabetes mellitus, with fat layer exposed, unspecified part of foot ([place])    -  Primary   Foot amputation status, left ([place])       Type 2 diabetes mellitus with diabetic neuropathy, unspecified long term insulin use status ([facility])       S/P foot surgery, left           -Patient seen and evaluated -Cleansed wound debirded wound margins excisionlly to healthy bleeding margins and applied silvadene cream and dry sterile dressing to surgical site left foot secured with ACE wrap and stockinet  -Advised patient to make sure to keep dressings clean, dry, and intact allowing nursing to change  dressings using silvadne -Request for Grafix placed since wound is stalling around the same size -Advised patient to continue with forefoot offloading post-op shoe on left foot with use of cane -Advised patient to limit activity to necessity  -Advised patient to ice and elevate as necessary  -Will plan for possible Osiris Grafix to wound bed at next visit. In the meantime, patient to call office if any issues or problems arise.   Landis Martins, DPM

## 2016-09-01 NOTE — Telephone Encounter (Deleted)
-----   Message from [place], [place] sent at 09/01/2016  3:01 PM EST ----- Regarding: Grafix and Nursing orders Silvadene cream to left foot ulcer x 1 week patient to come to office next week for possible grafix application if approved by insurance -Dr . Cannon Kettle

## 2016-09-01 NOTE — Telephone Encounter (Addendum)
-----   Message from [place], [place] sent at 09/01/2016  3:01 PM EST ----- Regarding: Grafix and Nursing orders Silvadene cream to left foot ulcer x 1 week patient to come to office next week for possible grafix application if approved by insurance -Dr . Cannon Kettle. Faxed orders of 09/01/2016 3:01pm to [facility]. 09/02/2016-faxed required form, clinicals and demographics to [facility]. 09/03/2016-Pt states he needs the cream for the nurses to change his dressing. I ordered the silvadene cream from [facility]. I informed pt I had just called in the Silvadene cream and apologized for the delay. 09/09/2016-Angela - [facility] states pt has been re-certified for home health care for 3x week for 8 weeks, and needs new orders faxed. 09/10/2016-I informed Levada Dy - [facility] that Dr.Stover wanted pt to have silvadene dressing to left foot 3x week as stated in 09/01/2016 Plan.09/16/2016-Left message for [place] - [facility]Gra[facility] to call or fax with the status of the authorization for pt's Grafix.

## 2016-09-03 MED ORDER — SILVER SULFADIAZINE 1 % EX CREA: 1.0000 "application " | TOPICAL_CREAM | Freq: Every day | CUTANEOUS | 1 refills | Status: DC

## 2016-09-04 DIAGNOSIS — Z4800 Encounter for change or removal of nonsurgical wound dressing: Secondary | ICD-10-CM | POA: Diagnosis not present

## 2016-09-04 DIAGNOSIS — Z794 Long term (current) use of insulin: Secondary | ICD-10-CM | POA: Diagnosis not present

## 2016-09-04 DIAGNOSIS — E11311 Type 2 diabetes mellitus with unspecified diabetic retinopathy with macular edema: Secondary | ICD-10-CM | POA: Diagnosis not present

## 2016-09-04 DIAGNOSIS — N183 Chronic kidney disease, stage 3 (moderate): Secondary | ICD-10-CM | POA: Diagnosis not present

## 2016-09-04 DIAGNOSIS — E1142 Type 2 diabetes mellitus with diabetic polyneuropathy: Secondary | ICD-10-CM | POA: Diagnosis not present

## 2016-09-04 DIAGNOSIS — E1122 Type 2 diabetes mellitus with diabetic chronic kidney disease: Secondary | ICD-10-CM | POA: Diagnosis not present

## 2016-09-04 DIAGNOSIS — L97522 Non-pressure chronic ulcer of other part of left foot with fat layer exposed: Secondary | ICD-10-CM | POA: Diagnosis not present

## 2016-09-04 DIAGNOSIS — Z79891 Long term (current) use of opiate analgesic: Secondary | ICD-10-CM | POA: Diagnosis not present

## 2016-09-04 DIAGNOSIS — Z7901 Long term (current) use of anticoagulants: Secondary | ICD-10-CM | POA: Diagnosis not present

## 2016-09-04 DIAGNOSIS — E11621 Type 2 diabetes mellitus with foot ulcer: Secondary | ICD-10-CM | POA: Diagnosis not present

## 2016-09-04 DIAGNOSIS — I129 Hypertensive chronic kidney disease with stage 1 through stage 4 chronic kidney disease, or unspecified chronic kidney disease: Secondary | ICD-10-CM | POA: Diagnosis not present

## 2016-09-07 DIAGNOSIS — E11311 Type 2 diabetes mellitus with unspecified diabetic retinopathy with macular edema: Secondary | ICD-10-CM | POA: Diagnosis not present

## 2016-09-07 DIAGNOSIS — I129 Hypertensive chronic kidney disease with stage 1 through stage 4 chronic kidney disease, or unspecified chronic kidney disease: Secondary | ICD-10-CM | POA: Diagnosis not present

## 2016-09-07 DIAGNOSIS — Z89422 Acquired absence of other left toe(s): Secondary | ICD-10-CM | POA: Diagnosis not present

## 2016-09-07 DIAGNOSIS — Z7901 Long term (current) use of anticoagulants: Secondary | ICD-10-CM | POA: Diagnosis not present

## 2016-09-07 DIAGNOSIS — Z794 Long term (current) use of insulin: Secondary | ICD-10-CM | POA: Diagnosis not present

## 2016-09-07 DIAGNOSIS — E11621 Type 2 diabetes mellitus with foot ulcer: Secondary | ICD-10-CM | POA: Diagnosis not present

## 2016-09-07 DIAGNOSIS — E1142 Type 2 diabetes mellitus with diabetic polyneuropathy: Secondary | ICD-10-CM | POA: Diagnosis not present

## 2016-09-07 DIAGNOSIS — Z4800 Encounter for change or removal of nonsurgical wound dressing: Secondary | ICD-10-CM | POA: Diagnosis not present

## 2016-09-07 DIAGNOSIS — N183 Chronic kidney disease, stage 3 (moderate): Secondary | ICD-10-CM | POA: Diagnosis not present

## 2016-09-07 DIAGNOSIS — E1122 Type 2 diabetes mellitus with diabetic chronic kidney disease: Secondary | ICD-10-CM | POA: Diagnosis not present

## 2016-09-07 DIAGNOSIS — L97522 Non-pressure chronic ulcer of other part of left foot with fat layer exposed: Secondary | ICD-10-CM | POA: Diagnosis not present

## 2016-09-08 ENCOUNTER — Ambulatory Visit: Payer: Medicare Other | Admitting: Sports Medicine

## 2016-09-09 DIAGNOSIS — L97522 Non-pressure chronic ulcer of other part of left foot with fat layer exposed: Secondary | ICD-10-CM | POA: Diagnosis not present

## 2016-09-09 DIAGNOSIS — I129 Hypertensive chronic kidney disease with stage 1 through stage 4 chronic kidney disease, or unspecified chronic kidney disease: Secondary | ICD-10-CM | POA: Diagnosis not present

## 2016-09-09 DIAGNOSIS — E1122 Type 2 diabetes mellitus with diabetic chronic kidney disease: Secondary | ICD-10-CM | POA: Diagnosis not present

## 2016-09-09 DIAGNOSIS — Z89422 Acquired absence of other left toe(s): Secondary | ICD-10-CM | POA: Diagnosis not present

## 2016-09-09 DIAGNOSIS — G4733 Obstructive sleep apnea (adult) (pediatric): Secondary | ICD-10-CM | POA: Diagnosis not present

## 2016-09-09 DIAGNOSIS — Z7901 Long term (current) use of anticoagulants: Secondary | ICD-10-CM | POA: Diagnosis not present

## 2016-09-09 DIAGNOSIS — Z794 Long term (current) use of insulin: Secondary | ICD-10-CM | POA: Diagnosis not present

## 2016-09-09 DIAGNOSIS — Z4800 Encounter for change or removal of nonsurgical wound dressing: Secondary | ICD-10-CM | POA: Diagnosis not present

## 2016-09-09 DIAGNOSIS — E11311 Type 2 diabetes mellitus with unspecified diabetic retinopathy with macular edema: Secondary | ICD-10-CM | POA: Diagnosis not present

## 2016-09-09 DIAGNOSIS — E11621 Type 2 diabetes mellitus with foot ulcer: Secondary | ICD-10-CM | POA: Diagnosis not present

## 2016-09-09 DIAGNOSIS — E1142 Type 2 diabetes mellitus with diabetic polyneuropathy: Secondary | ICD-10-CM | POA: Diagnosis not present

## 2016-09-09 DIAGNOSIS — N183 Chronic kidney disease, stage 3 (moderate): Secondary | ICD-10-CM | POA: Diagnosis not present

## 2016-09-09 NOTE — Telephone Encounter (Signed)
Yes that's correct

## 2016-09-11 DIAGNOSIS — Z4800 Encounter for change or removal of nonsurgical wound dressing: Secondary | ICD-10-CM | POA: Diagnosis not present

## 2016-09-11 DIAGNOSIS — E11621 Type 2 diabetes mellitus with foot ulcer: Secondary | ICD-10-CM | POA: Diagnosis not present

## 2016-09-11 DIAGNOSIS — E1142 Type 2 diabetes mellitus with diabetic polyneuropathy: Secondary | ICD-10-CM | POA: Diagnosis not present

## 2016-09-11 DIAGNOSIS — E1122 Type 2 diabetes mellitus with diabetic chronic kidney disease: Secondary | ICD-10-CM | POA: Diagnosis not present

## 2016-09-11 DIAGNOSIS — Z89422 Acquired absence of other left toe(s): Secondary | ICD-10-CM | POA: Diagnosis not present

## 2016-09-11 DIAGNOSIS — Z794 Long term (current) use of insulin: Secondary | ICD-10-CM | POA: Diagnosis not present

## 2016-09-11 DIAGNOSIS — L97522 Non-pressure chronic ulcer of other part of left foot with fat layer exposed: Secondary | ICD-10-CM | POA: Diagnosis not present

## 2016-09-11 DIAGNOSIS — E11311 Type 2 diabetes mellitus with unspecified diabetic retinopathy with macular edema: Secondary | ICD-10-CM | POA: Diagnosis not present

## 2016-09-11 DIAGNOSIS — I129 Hypertensive chronic kidney disease with stage 1 through stage 4 chronic kidney disease, or unspecified chronic kidney disease: Secondary | ICD-10-CM | POA: Diagnosis not present

## 2016-09-11 DIAGNOSIS — Z7901 Long term (current) use of anticoagulants: Secondary | ICD-10-CM | POA: Diagnosis not present

## 2016-09-11 DIAGNOSIS — N183 Chronic kidney disease, stage 3 (moderate): Secondary | ICD-10-CM | POA: Diagnosis not present

## 2016-09-14 DIAGNOSIS — E1142 Type 2 diabetes mellitus with diabetic polyneuropathy: Secondary | ICD-10-CM | POA: Diagnosis not present

## 2016-09-14 DIAGNOSIS — Z4800 Encounter for change or removal of nonsurgical wound dressing: Secondary | ICD-10-CM | POA: Diagnosis not present

## 2016-09-14 DIAGNOSIS — E11621 Type 2 diabetes mellitus with foot ulcer: Secondary | ICD-10-CM | POA: Diagnosis not present

## 2016-09-14 DIAGNOSIS — L97522 Non-pressure chronic ulcer of other part of left foot with fat layer exposed: Secondary | ICD-10-CM | POA: Diagnosis not present

## 2016-09-14 DIAGNOSIS — Z794 Long term (current) use of insulin: Secondary | ICD-10-CM | POA: Diagnosis not present

## 2016-09-14 DIAGNOSIS — Z7901 Long term (current) use of anticoagulants: Secondary | ICD-10-CM | POA: Diagnosis not present

## 2016-09-14 DIAGNOSIS — G4733 Obstructive sleep apnea (adult) (pediatric): Secondary | ICD-10-CM | POA: Diagnosis not present

## 2016-09-14 DIAGNOSIS — Z89422 Acquired absence of other left toe(s): Secondary | ICD-10-CM | POA: Diagnosis not present

## 2016-09-14 DIAGNOSIS — N183 Chronic kidney disease, stage 3 (moderate): Secondary | ICD-10-CM | POA: Diagnosis not present

## 2016-09-14 DIAGNOSIS — E1122 Type 2 diabetes mellitus with diabetic chronic kidney disease: Secondary | ICD-10-CM | POA: Diagnosis not present

## 2016-09-14 DIAGNOSIS — I129 Hypertensive chronic kidney disease with stage 1 through stage 4 chronic kidney disease, or unspecified chronic kidney disease: Secondary | ICD-10-CM | POA: Diagnosis not present

## 2016-09-14 DIAGNOSIS — E11311 Type 2 diabetes mellitus with unspecified diabetic retinopathy with macular edema: Secondary | ICD-10-CM | POA: Diagnosis not present

## 2016-09-15 ENCOUNTER — Ambulatory Visit (INDEPENDENT_AMBULATORY_CARE_PROVIDER_SITE_OTHER): Payer: Medicare Other | Admitting: Sports Medicine

## 2016-09-15 ENCOUNTER — Encounter: Payer: Self-pay | Admitting: Internal Medicine

## 2016-09-15 ENCOUNTER — Encounter: Payer: Self-pay | Admitting: Sports Medicine

## 2016-09-15 ENCOUNTER — Ambulatory Visit (INDEPENDENT_AMBULATORY_CARE_PROVIDER_SITE_OTHER): Payer: Medicare Other | Admitting: Internal Medicine

## 2016-09-15 VITALS — BP 151/67 | HR 73 | Temp 98.4°F | Ht 78.0 in | Wt 379.8 lb

## 2016-09-15 DIAGNOSIS — N183 Chronic kidney disease, stage 3 unspecified: Secondary | ICD-10-CM

## 2016-09-15 DIAGNOSIS — Z794 Long term (current) use of insulin: Secondary | ICD-10-CM

## 2016-09-15 DIAGNOSIS — I129 Hypertensive chronic kidney disease with stage 1 through stage 4 chronic kidney disease, or unspecified chronic kidney disease: Secondary | ICD-10-CM | POA: Diagnosis not present

## 2016-09-15 DIAGNOSIS — Z87891 Personal history of nicotine dependence: Secondary | ICD-10-CM

## 2016-09-15 DIAGNOSIS — L97529 Non-pressure chronic ulcer of other part of left foot with unspecified severity: Secondary | ICD-10-CM

## 2016-09-15 DIAGNOSIS — E1140 Type 2 diabetes mellitus with diabetic neuropathy, unspecified: Secondary | ICD-10-CM | POA: Diagnosis not present

## 2016-09-15 DIAGNOSIS — Z89432 Acquired absence of left foot: Secondary | ICD-10-CM

## 2016-09-15 DIAGNOSIS — L97522 Non-pressure chronic ulcer of other part of left foot with fat layer exposed: Secondary | ICD-10-CM

## 2016-09-15 DIAGNOSIS — Z9889 Other specified postprocedural states: Secondary | ICD-10-CM

## 2016-09-15 DIAGNOSIS — E1165 Type 2 diabetes mellitus with hyperglycemia: Secondary | ICD-10-CM | POA: Diagnosis not present

## 2016-09-15 DIAGNOSIS — I480 Paroxysmal atrial fibrillation: Secondary | ICD-10-CM | POA: Diagnosis not present

## 2016-09-15 DIAGNOSIS — IMO0002 Reserved for concepts with insufficient information to code with codable children: Secondary | ICD-10-CM

## 2016-09-15 DIAGNOSIS — M79672 Pain in left foot: Secondary | ICD-10-CM

## 2016-09-15 DIAGNOSIS — E11621 Type 2 diabetes mellitus with foot ulcer: Secondary | ICD-10-CM | POA: Diagnosis not present

## 2016-09-15 DIAGNOSIS — K219 Gastro-esophageal reflux disease without esophagitis: Secondary | ICD-10-CM

## 2016-09-15 DIAGNOSIS — G894 Chronic pain syndrome: Secondary | ICD-10-CM | POA: Diagnosis not present

## 2016-09-15 DIAGNOSIS — I10 Essential (primary) hypertension: Secondary | ICD-10-CM

## 2016-09-15 DIAGNOSIS — Z7901 Long term (current) use of anticoagulants: Secondary | ICD-10-CM

## 2016-09-15 DIAGNOSIS — E1122 Type 2 diabetes mellitus with diabetic chronic kidney disease: Secondary | ICD-10-CM

## 2016-09-15 DIAGNOSIS — Z79891 Long term (current) use of opiate analgesic: Secondary | ICD-10-CM

## 2016-09-15 DIAGNOSIS — Z79899 Other long term (current) drug therapy: Secondary | ICD-10-CM

## 2016-09-15 DIAGNOSIS — E1142 Type 2 diabetes mellitus with diabetic polyneuropathy: Secondary | ICD-10-CM

## 2016-09-15 DIAGNOSIS — E559 Vitamin D deficiency, unspecified: Secondary | ICD-10-CM

## 2016-09-15 LAB — GLUCOSE, CAPILLARY: Glucose-Capillary: 173 mg/dL — ABNORMAL HIGH (ref 65–99)

## 2016-09-15 MED ORDER — GABAPENTIN 300 MG PO CAPS
300.0000 mg | ORAL_CAPSULE | Freq: Every day | ORAL | 3 refills | Status: DC
Start: 2016-09-15 — End: 2016-12-31

## 2016-09-15 NOTE — Assessment & Plan Note (Addendum)
-   Patientt with a left diabetic foot ulcer for which he has been following closely with podiatry - He has finished his course of antibiotics for this and states it is healing but will likely Osiris Grafix placed in wound bed as the wound size does not seem to be decreasing - He will f/u with podiatry today for further work up

## 2016-09-15 NOTE — Progress Notes (Signed)
   Subjective:    Patient ID: [name], male    DOB: 10-22-1950, 66 y.o.   MRN: [id number]  HPI  I have seen and examined this patient. He is here for routine follow-up of his diabetes and hypertension.  Of note, patient complains of intermittent nausea and diarrhea over the last week which is now resolved. He states that his wife has been sick with gastroenteritis and thinks he may have got this from her. He has been using his home nausea medication with good relief and states that he feels much better today.  Patient is also on opiates for his chronic pain syndrome and had these refilled recently. We have been slowly tapering down the number of pills he is getting of the month given a positive UDS for cocaine. His UDS since then have not shown any illicit substances.  Review of Systems  Constitutional: Negative.   HENT: Negative.   Cardiovascular: Negative.   Gastrointestinal: Positive for diarrhea and nausea. Negative for abdominal distention, abdominal pain and vomiting.  Musculoskeletal: Positive for arthralgias. Negative for back pain and myalgias.  Neurological: Negative.   Psychiatric/Behavioral: Negative.        Objective:   Physical Exam  Constitutional: He is oriented to person, place, and time. He appears well-developed and well-nourished.  HENT:  Head: Normocephalic and atraumatic.  Mouth/Throat: No oropharyngeal exudate.  Neck: Neck supple.  Cardiovascular: Normal rate, regular rhythm and normal heart sounds.   Pulmonary/Chest: Effort normal and breath sounds normal. No respiratory distress. He has no wheezes.  Abdominal: Soft. Bowel sounds are normal. He exhibits no distension. There is no tenderness.  Musculoskeletal: Normal range of motion. He exhibits no edema.  Left foot dressing intact  Lymphadenopathy:    He has no cervical adenopathy.  Neurological: He is alert and oriented to person, place, and time.  Psychiatric: He has a normal mood and affect.  His behavior is normal.          Assessment & Plan:  Please see problem based charting for assessment and plan:

## 2016-09-15 NOTE — Assessment & Plan Note (Signed)
-   Patient has been on opiates chronically for his peripheral neuropathy pain - He has had UDS positive for cocaine in the past and has been on a slow taper of his opiates since then - He is now on 130 pills of Norco every month. He requested a referral to the pain clinic for follow-up - He has an appointment with pain clinic on April 3rd after which we will defer management of his pain to them - We started him on gabapentin today for his neuropathic pain

## 2016-09-15 NOTE — Progress Notes (Signed)
Subjective: [name] is a 66 y.o. male patient seen today in office for POV #11 (DOS 06-15-16), S/P Left wound debridement and placement of stravix allograft. Patient had silvadene cream placed last visit, denies pain at surgical site, denies calf pain, denies headache, chest pain, shortness of breath, nausea, vomiting, fever, or chills. Patient states that he is doing well with no issues. Has home nursing every other day. No other issues noted.   Patient Active Problem List   Diagnosis Date Noted  . Chronic pain syndrome 09/18/2015  . Preventative health care 03/12/2015  . Family history of coronary artery disease in father 12/14/2014  . Acute osteomyelitis of toe of left foot ([place])   . Foot ulcer, left ([place]) 12/13/2014  . Paroxysmal atrial fibrillation ([place]) 12/13/2014  . Elevated alkaline phosphatase level 06/11/2014  . Constipation 05/31/2014  . Vitamin D deficiency 11/17/2013  . Anemia 02/10/2012  . Hypertension 11/18/2011  . Wheezing 06/08/2011  . Obstructive sleep apnea 12/12/2009  . GERD 06/27/2008  . Diabetes type 2, uncontrolled ([place]) 12/28/2007  . DIABETIC MACULAR EDEMA 10/07/2007  . Dyslipidemia 08/15/2007  . BACKGROUND DIABETIC RETINOPATHY 08/15/2007  . ERECTILE DYSFUNCTION 05/31/2007  . HEARING LOSS, SENSORINEURAL, BILATERAL 01/03/2007  . Diabetic peripheral neuropathy associated with type 2 diabetes mellitus ([place]) 12/16/2006  . Chronic kidney disease, stage III (moderate) 12/01/2006  . Morbid obesity ([place]) 08/23/2006  . STATUS, OTHER TOE(S) AMPUTATION 05/21/2006    Current Outpatient Prescriptions on File Prior to Visit  Medication Sig Dispense Refill  . ACCU-CHEK AVIVA PLUS test strip USE TO CHECK BLOOD SUGAR FOUR TIMES DAILY BEFORE MEALS AND AT BEDTIME 150 each 3  . ACCU-CHEK FASTCLIX LANCETS MISC Check blood sugar 4 times a day before meals and bedtime dx code 250.02 insulin requiring 102 each 6  . atorvastatin (LIPITOR) 20 MG tablet TAKE 1  TABLET(20 MG) BY MOUTH DAILY 90 tablet 3  . BESIVANCE 0.6 % SUSP Place 1 drop into both eyes 4 (four) times daily.   12  . Blood Glucose Monitoring Suppl (ACCU-CHEK AVIVA PLUS) W/DEVICE KIT 1 each by Does not apply route 4 (four) times daily -  with meals and at bedtime. 1 kit 0  . Cholecalciferol (VITAMIN D3) 2000 UNITS capsule Take 1 capsule (2,000 Units total) by mouth daily. 90 capsule 0  . diclofenac sodium (VOLTAREN) 1 % GEL Apply 4 g topically 4 (four) times daily. 100 g 2  . docusate sodium (COLACE) 100 MG capsule Take 1 capsule (100 mg total) by mouth daily as needed for mild constipation. 14 capsule 0  . ELIQUIS 5 MG TABS tablet TAKE 1 TABLET(5 MG) BY MOUTH TWICE DAILY 180 tablet 0  . enalapril (VASOTEC) 20 MG tablet TAKE 2 TABLETS(40 MG) BY MOUTH DAILY 180 tablet 1  . furosemide (LASIX) 40 MG tablet TAKE 1 TABLET(40 MG) BY MOUTH TWICE DAILY 180 tablet 1  . gabapentin (NEURONTIN) 300 MG capsule Take 1 capsule (300 mg total) by mouth at bedtime. 30 capsule 3  . HUMULIN R U-500 KWIKPEN 500 UNIT/ML kwikpen INJECT 20 UNITS AT BREAKFAST, 60 UNITS AT LUNCH, AND 70 UNITS AT DINNER 18 mL 0  . HYDROcodone-acetaminophen (NORCO) 7.5-325 MG tablet Take 1 tablet by mouth every 6 (six) hours as needed for moderate pain. 130 tablet 0  . Insulin Glargine (TOUJEO SOLOSTAR) 300 UNIT/ML SOPN Inject 60-90 Units into the skin 2 (two) times daily. (Patient taking differently: Inject 70-80 Units into the skin 2 (two) times daily. )  45 pen 2  . Insulin Pen Needle 31G X 5 MM MISC Use for insulin injection 5 times a day. 150 each 5  . LUMIGAN 0.01 % SOLN Place 1 drop into both eyes at bedtime.     . metoprolol succinate (TOPROL-XL) 25 MG 24 hr tablet Take 1 tablet (25 mg total) by mouth daily. 90 tablet 3  . omeprazole (PRILOSEC) 20 MG capsule TAKE 1 CAPSULE(20 MG) BY MOUTH DAILY 90 capsule 1  . PROAIR HFA 108 (90 Base) MCG/ACT inhaler INHALE 2 PUFFS INTO THE LUNGS BY MOUTH EVERY 6 HOURS AS NEEDED FOR WHEEZING OR  SHORTNESS OF BREATH 8.5 g 0  . promethazine (PHENERGAN) 25 MG tablet Take 1 tablet (25 mg total) by mouth every 8 (eight) hours as needed for nausea or vomiting. 30 tablet 2  . silver sulfADIAZINE (SILVADENE) 1 % cream Apply 1 application topically daily. 50 g 1  . SIMBRINZA 1-0.2 % SUSP Place 1 drop into both eyes 2 (two) times daily. Take as instructed by Dr. Katy Fitch.    Marland Kitchen VICTOZA 18 MG/3ML SOPN INJECT 1.8 MG ONCE DAILY AT THE SAME TIME 9 mL 0   No current facility-administered medications on file prior to visit.     Allergies  Allergen Reactions  . Vancomycin     REACTION: ARF    Objective: There were no vitals filed for this visit.  General: No acute distress, AAOx3  Left foot: Plantar forefoot, wound graft fully incorporated with granular wound bed measuring 2.5x4x0.2cm slightly smaller with mild periwound maceration, swelling to left foot, no erythema, no warmth, no active drainage however on inner guaze there is drainage, no acute signs of infection noted, Capillary fill time <3 seconds in all digits remaining, amputation status of 2nd and 3rd toes. No pain with calf compression.   Assessment and Plan:  Problem List Items Addressed This Visit    None    Visit Diagnoses    Diabetic ulcer of left foot associated with type 2 diabetes mellitus, with fat layer exposed, unspecified part of foot ([place])    -  Primary   Foot amputation status, left ([place])       Type 2 diabetes mellitus with diabetic neuropathy, unspecified long term insulin use status ([facility])       S/P foot surgery, left       Foot pain, left           -Patient seen and evaluated -Cleansed wound debirded wound margins excisionlly to healthy bleeding margins and applied silvadene cream and dry sterile dressing to surgical site left foot secured with ACE wrap and stockinet  -Advised patient to make sure to keep dressings clean, dry, and intact allowing nursing to change dressings using silvadene -Request for Grafix placed  since wound is stalling around the same size; AWAITING APPROVAL -Advised patient to continue with forefoot offloading post-op shoe on left foot with use of cane -Advised patient to limit activity to necessity  -Advised patient to ice and elevate as necessary  -Will plan for possible Osiris Grafix to wound bed at next visit. In the meantime, patient to call office if any issues or problems arise.   Landis Martins, DPM

## 2016-09-15 NOTE — Assessment & Plan Note (Signed)
BP Readings from Last 3 Encounters:  09/15/16 (!) 151/67  07/28/16 129/75  07/20/16 (!) 144/80    Lab Results  Component Value Date   NA 143 07/16/2016   K 3.7 07/16/2016   CREATININE 1.38 07/16/2016    Assessment: Blood pressure control:  fair Progress toward BP goal:   deteriorated Comments: compliant with his metoprolol, lasix and enalapril  Plan: Medications:  continue current medications Educational resources provided: brochure Self management tools provided: home blood pressure logbook Other plans: Patient to maintain BP log at home and bring his machine on next visit. Will consider increasing his enalapril if his BP remains elevated

## 2016-09-15 NOTE — Assessment & Plan Note (Signed)
-   His last creatinine in Jan 2018 showed an improvement in his renal function - Will repeat BMP on his next visit

## 2016-09-15 NOTE — Assessment & Plan Note (Addendum)
-   Patient still with an elevated A1C of 9.7 - continue with current medications as per Dr. Dwyane Dee - States he was tried on an insulin pump but did not do well on this - He was noted to have an average BS of 194 with a high of 554 and a low of 48 which he attributes to taking insulin and not eating - He will f/u Dr. Dwyane Dee for changes in his regimen - Advised patient to take his pre meal insulin with his food in front of him so he does not forget to eat after taking his insulin

## 2016-09-15 NOTE — Assessment & Plan Note (Signed)
-   Patient has been on vitamin D chronically since being diagnosed with Vitamin D deficiency but has not had repeat blood work since March 2016 - Will recheck blood work on follow up to ensure that this has improved and if it has will consider him for a trial off vitamin D to see if he still needs this

## 2016-09-15 NOTE — Patient Instructions (Addendum)
-   It was a pleasure seeing you today - Please follow up in 3 months - Your BP is a little high today. Please maintain a BP log and bring it with you on your next appointment as well as your BP machine - We will check a vitamin D level on your next visit - I will start you on gabapentin for your pain - Please follow up in the pain clinic for your chronic pain

## 2016-09-15 NOTE — Assessment & Plan Note (Signed)
-   He has had no episodes of palpitations - will continue with eliquis and metoprolol for now

## 2016-09-15 NOTE — Assessment & Plan Note (Signed)
-   Patient states his symptoms are well controlled on prilosec - Will continue with PPI for now - Can consider asking him to do a trial off PPI and assess his symptoms on his next visit

## 2016-09-15 NOTE — Assessment & Plan Note (Signed)
-   He has stopped taking the lyrica - Complains of worsening burning in his legs especially at night - Started him on gabapentin 300 mh qhs and will titrate him up as needed

## 2016-09-16 ENCOUNTER — Other Ambulatory Visit: Payer: Self-pay | Admitting: Endocrinology

## 2016-09-16 DIAGNOSIS — E11621 Type 2 diabetes mellitus with foot ulcer: Secondary | ICD-10-CM | POA: Diagnosis not present

## 2016-09-16 DIAGNOSIS — Z89422 Acquired absence of other left toe(s): Secondary | ICD-10-CM | POA: Diagnosis not present

## 2016-09-16 DIAGNOSIS — E1122 Type 2 diabetes mellitus with diabetic chronic kidney disease: Secondary | ICD-10-CM | POA: Diagnosis not present

## 2016-09-16 DIAGNOSIS — E11311 Type 2 diabetes mellitus with unspecified diabetic retinopathy with macular edema: Secondary | ICD-10-CM | POA: Diagnosis not present

## 2016-09-16 DIAGNOSIS — Z4800 Encounter for change or removal of nonsurgical wound dressing: Secondary | ICD-10-CM | POA: Diagnosis not present

## 2016-09-16 DIAGNOSIS — N183 Chronic kidney disease, stage 3 (moderate): Secondary | ICD-10-CM | POA: Diagnosis not present

## 2016-09-16 DIAGNOSIS — Z794 Long term (current) use of insulin: Secondary | ICD-10-CM | POA: Diagnosis not present

## 2016-09-16 DIAGNOSIS — I129 Hypertensive chronic kidney disease with stage 1 through stage 4 chronic kidney disease, or unspecified chronic kidney disease: Secondary | ICD-10-CM | POA: Diagnosis not present

## 2016-09-16 DIAGNOSIS — Z7901 Long term (current) use of anticoagulants: Secondary | ICD-10-CM | POA: Diagnosis not present

## 2016-09-16 DIAGNOSIS — L97522 Non-pressure chronic ulcer of other part of left foot with fat layer exposed: Secondary | ICD-10-CM | POA: Diagnosis not present

## 2016-09-16 DIAGNOSIS — E1142 Type 2 diabetes mellitus with diabetic polyneuropathy: Secondary | ICD-10-CM | POA: Diagnosis not present

## 2016-09-18 DIAGNOSIS — I129 Hypertensive chronic kidney disease with stage 1 through stage 4 chronic kidney disease, or unspecified chronic kidney disease: Secondary | ICD-10-CM | POA: Diagnosis not present

## 2016-09-18 DIAGNOSIS — Z794 Long term (current) use of insulin: Secondary | ICD-10-CM | POA: Diagnosis not present

## 2016-09-18 DIAGNOSIS — E11311 Type 2 diabetes mellitus with unspecified diabetic retinopathy with macular edema: Secondary | ICD-10-CM | POA: Diagnosis not present

## 2016-09-18 DIAGNOSIS — Z4800 Encounter for change or removal of nonsurgical wound dressing: Secondary | ICD-10-CM | POA: Diagnosis not present

## 2016-09-18 DIAGNOSIS — Z89422 Acquired absence of other left toe(s): Secondary | ICD-10-CM | POA: Diagnosis not present

## 2016-09-18 DIAGNOSIS — L97522 Non-pressure chronic ulcer of other part of left foot with fat layer exposed: Secondary | ICD-10-CM | POA: Diagnosis not present

## 2016-09-18 DIAGNOSIS — Z7901 Long term (current) use of anticoagulants: Secondary | ICD-10-CM | POA: Diagnosis not present

## 2016-09-18 DIAGNOSIS — E1142 Type 2 diabetes mellitus with diabetic polyneuropathy: Secondary | ICD-10-CM | POA: Diagnosis not present

## 2016-09-18 DIAGNOSIS — N183 Chronic kidney disease, stage 3 (moderate): Secondary | ICD-10-CM | POA: Diagnosis not present

## 2016-09-18 DIAGNOSIS — E1122 Type 2 diabetes mellitus with diabetic chronic kidney disease: Secondary | ICD-10-CM | POA: Diagnosis not present

## 2016-09-18 DIAGNOSIS — E11621 Type 2 diabetes mellitus with foot ulcer: Secondary | ICD-10-CM | POA: Diagnosis not present

## 2016-09-19 LAB — TOXASSURE SELECT,+ANTIDEPR,UR

## 2016-09-21 DIAGNOSIS — Z7901 Long term (current) use of anticoagulants: Secondary | ICD-10-CM | POA: Diagnosis not present

## 2016-09-21 DIAGNOSIS — E11621 Type 2 diabetes mellitus with foot ulcer: Secondary | ICD-10-CM | POA: Diagnosis not present

## 2016-09-21 DIAGNOSIS — E1142 Type 2 diabetes mellitus with diabetic polyneuropathy: Secondary | ICD-10-CM | POA: Diagnosis not present

## 2016-09-21 DIAGNOSIS — E1122 Type 2 diabetes mellitus with diabetic chronic kidney disease: Secondary | ICD-10-CM | POA: Diagnosis not present

## 2016-09-21 DIAGNOSIS — N183 Chronic kidney disease, stage 3 (moderate): Secondary | ICD-10-CM | POA: Diagnosis not present

## 2016-09-21 DIAGNOSIS — Z4800 Encounter for change or removal of nonsurgical wound dressing: Secondary | ICD-10-CM | POA: Diagnosis not present

## 2016-09-21 DIAGNOSIS — L97522 Non-pressure chronic ulcer of other part of left foot with fat layer exposed: Secondary | ICD-10-CM | POA: Diagnosis not present

## 2016-09-21 DIAGNOSIS — E11311 Type 2 diabetes mellitus with unspecified diabetic retinopathy with macular edema: Secondary | ICD-10-CM | POA: Diagnosis not present

## 2016-09-21 DIAGNOSIS — Z794 Long term (current) use of insulin: Secondary | ICD-10-CM | POA: Diagnosis not present

## 2016-09-21 DIAGNOSIS — I129 Hypertensive chronic kidney disease with stage 1 through stage 4 chronic kidney disease, or unspecified chronic kidney disease: Secondary | ICD-10-CM | POA: Diagnosis not present

## 2016-09-21 DIAGNOSIS — Z89422 Acquired absence of other left toe(s): Secondary | ICD-10-CM | POA: Diagnosis not present

## 2016-09-22 ENCOUNTER — Ambulatory Visit: Payer: Medicare Other | Admitting: Sports Medicine

## 2016-09-22 ENCOUNTER — Telehealth: Payer: Self-pay | Admitting: Internal Medicine

## 2016-09-22 NOTE — Telephone Encounter (Signed)
Returning call to nurse. Doesn't know who called

## 2016-09-23 ENCOUNTER — Telehealth: Payer: Self-pay | Admitting: Internal Medicine

## 2016-09-23 DIAGNOSIS — E1142 Type 2 diabetes mellitus with diabetic polyneuropathy: Secondary | ICD-10-CM | POA: Diagnosis not present

## 2016-09-23 DIAGNOSIS — N183 Chronic kidney disease, stage 3 (moderate): Secondary | ICD-10-CM | POA: Diagnosis not present

## 2016-09-23 DIAGNOSIS — Z7901 Long term (current) use of anticoagulants: Secondary | ICD-10-CM | POA: Diagnosis not present

## 2016-09-23 DIAGNOSIS — G4733 Obstructive sleep apnea (adult) (pediatric): Secondary | ICD-10-CM | POA: Diagnosis not present

## 2016-09-23 DIAGNOSIS — Z89422 Acquired absence of other left toe(s): Secondary | ICD-10-CM | POA: Diagnosis not present

## 2016-09-23 DIAGNOSIS — E11621 Type 2 diabetes mellitus with foot ulcer: Secondary | ICD-10-CM | POA: Diagnosis not present

## 2016-09-23 DIAGNOSIS — Z794 Long term (current) use of insulin: Secondary | ICD-10-CM | POA: Diagnosis not present

## 2016-09-23 DIAGNOSIS — E11311 Type 2 diabetes mellitus with unspecified diabetic retinopathy with macular edema: Secondary | ICD-10-CM | POA: Diagnosis not present

## 2016-09-23 DIAGNOSIS — E1122 Type 2 diabetes mellitus with diabetic chronic kidney disease: Secondary | ICD-10-CM | POA: Diagnosis not present

## 2016-09-23 DIAGNOSIS — I129 Hypertensive chronic kidney disease with stage 1 through stage 4 chronic kidney disease, or unspecified chronic kidney disease: Secondary | ICD-10-CM | POA: Diagnosis not present

## 2016-09-23 DIAGNOSIS — Z4800 Encounter for change or removal of nonsurgical wound dressing: Secondary | ICD-10-CM | POA: Diagnosis not present

## 2016-09-23 DIAGNOSIS — L97522 Non-pressure chronic ulcer of other part of left foot with fat layer exposed: Secondary | ICD-10-CM | POA: Diagnosis not present

## 2016-09-23 NOTE — Telephone Encounter (Signed)
Tempted to call patient multiple times regarding UDS which was negative for hydrocodone. This was an abnormal result as he had reported taking his pain medication the day before. He is on a taper of his hydrocodone. Patient has an appointment in April with Dr. Mirna Mires - pain management. Will need to decide after talking with him whether we should continue to prescribe any pain medications from our clinic.

## 2016-09-24 DIAGNOSIS — E119 Type 2 diabetes mellitus without complications: Secondary | ICD-10-CM | POA: Diagnosis not present

## 2016-09-24 NOTE — Telephone Encounter (Signed)
Lm for rtc 

## 2016-09-25 DIAGNOSIS — Z4800 Encounter for change or removal of nonsurgical wound dressing: Secondary | ICD-10-CM | POA: Diagnosis not present

## 2016-09-25 DIAGNOSIS — E11621 Type 2 diabetes mellitus with foot ulcer: Secondary | ICD-10-CM | POA: Diagnosis not present

## 2016-09-25 DIAGNOSIS — Z794 Long term (current) use of insulin: Secondary | ICD-10-CM | POA: Diagnosis not present

## 2016-09-25 DIAGNOSIS — I129 Hypertensive chronic kidney disease with stage 1 through stage 4 chronic kidney disease, or unspecified chronic kidney disease: Secondary | ICD-10-CM | POA: Diagnosis not present

## 2016-09-25 DIAGNOSIS — N183 Chronic kidney disease, stage 3 (moderate): Secondary | ICD-10-CM | POA: Diagnosis not present

## 2016-09-25 DIAGNOSIS — Z89422 Acquired absence of other left toe(s): Secondary | ICD-10-CM | POA: Diagnosis not present

## 2016-09-25 DIAGNOSIS — L97522 Non-pressure chronic ulcer of other part of left foot with fat layer exposed: Secondary | ICD-10-CM | POA: Diagnosis not present

## 2016-09-25 DIAGNOSIS — E1142 Type 2 diabetes mellitus with diabetic polyneuropathy: Secondary | ICD-10-CM | POA: Diagnosis not present

## 2016-09-25 DIAGNOSIS — Z7901 Long term (current) use of anticoagulants: Secondary | ICD-10-CM | POA: Diagnosis not present

## 2016-09-25 DIAGNOSIS — E11311 Type 2 diabetes mellitus with unspecified diabetic retinopathy with macular edema: Secondary | ICD-10-CM | POA: Diagnosis not present

## 2016-09-25 DIAGNOSIS — E1122 Type 2 diabetes mellitus with diabetic chronic kidney disease: Secondary | ICD-10-CM | POA: Diagnosis not present

## 2016-09-28 ENCOUNTER — Other Ambulatory Visit: Payer: Self-pay | Admitting: *Deleted

## 2016-09-28 ENCOUNTER — Other Ambulatory Visit: Payer: Self-pay | Admitting: Internal Medicine

## 2016-09-28 DIAGNOSIS — Z794 Long term (current) use of insulin: Secondary | ICD-10-CM | POA: Diagnosis not present

## 2016-09-28 DIAGNOSIS — E1142 Type 2 diabetes mellitus with diabetic polyneuropathy: Secondary | ICD-10-CM | POA: Diagnosis not present

## 2016-09-28 DIAGNOSIS — L97522 Non-pressure chronic ulcer of other part of left foot with fat layer exposed: Secondary | ICD-10-CM | POA: Diagnosis not present

## 2016-09-28 DIAGNOSIS — I129 Hypertensive chronic kidney disease with stage 1 through stage 4 chronic kidney disease, or unspecified chronic kidney disease: Secondary | ICD-10-CM | POA: Diagnosis not present

## 2016-09-28 DIAGNOSIS — E1122 Type 2 diabetes mellitus with diabetic chronic kidney disease: Secondary | ICD-10-CM | POA: Diagnosis not present

## 2016-09-28 DIAGNOSIS — Z4800 Encounter for change or removal of nonsurgical wound dressing: Secondary | ICD-10-CM | POA: Diagnosis not present

## 2016-09-28 DIAGNOSIS — Z7901 Long term (current) use of anticoagulants: Secondary | ICD-10-CM | POA: Diagnosis not present

## 2016-09-28 DIAGNOSIS — E11621 Type 2 diabetes mellitus with foot ulcer: Secondary | ICD-10-CM | POA: Diagnosis not present

## 2016-09-28 DIAGNOSIS — E11311 Type 2 diabetes mellitus with unspecified diabetic retinopathy with macular edema: Secondary | ICD-10-CM | POA: Diagnosis not present

## 2016-09-28 DIAGNOSIS — G894 Chronic pain syndrome: Secondary | ICD-10-CM

## 2016-09-28 DIAGNOSIS — Z89422 Acquired absence of other left toe(s): Secondary | ICD-10-CM | POA: Diagnosis not present

## 2016-09-28 DIAGNOSIS — N183 Chronic kidney disease, stage 3 (moderate): Secondary | ICD-10-CM | POA: Diagnosis not present

## 2016-09-28 MED ORDER — APIXABAN 5 MG PO TABS: 5.0000 mg | ORAL_TABLET | Freq: Two times a day (BID) | ORAL | 2 refills | Status: DC

## 2016-09-28 MED ORDER — HYDROCODONE-ACETAMINOPHEN 7.5-325 MG PO TABS: 1.0000 | ORAL_TABLET | Freq: Four times a day (QID) | ORAL | 0 refills | Status: DC | PRN

## 2016-09-28 NOTE — Telephone Encounter (Signed)
Pt aware.

## 2016-09-28 NOTE — Telephone Encounter (Signed)
Mr Mitter called today, requesting pain med, he was transferred to dr Dareen Piano

## 2016-09-29 ENCOUNTER — Ambulatory Visit (INDEPENDENT_AMBULATORY_CARE_PROVIDER_SITE_OTHER): Payer: Medicare Other | Admitting: Sports Medicine

## 2016-09-29 ENCOUNTER — Encounter: Payer: Self-pay | Admitting: Sports Medicine

## 2016-09-29 DIAGNOSIS — E11621 Type 2 diabetes mellitus with foot ulcer: Secondary | ICD-10-CM

## 2016-09-29 DIAGNOSIS — L97522 Non-pressure chronic ulcer of other part of left foot with fat layer exposed: Secondary | ICD-10-CM

## 2016-09-29 DIAGNOSIS — Z89432 Acquired absence of left foot: Secondary | ICD-10-CM

## 2016-09-29 DIAGNOSIS — E1140 Type 2 diabetes mellitus with diabetic neuropathy, unspecified: Secondary | ICD-10-CM

## 2016-09-29 DIAGNOSIS — IMO0002 Reserved for concepts with insufficient information to code with codable children: Secondary | ICD-10-CM

## 2016-09-29 NOTE — Progress Notes (Signed)
Subjective: [name] is a 66 y.o. male patient seen today in office for POV #12 (DOS 06-15-16), S/P Left wound debridement and placement of stravix allograft. Patient had silvadene cream placed last visit, denies pain at surgical site, denies calf pain, denies headache, chest pain, shortness of breath, nausea, vomiting, fever, or chills. Patient states that he is doing well with no issues. Has home nursing every other day. No other issues noted.   Patient Active Problem List   Diagnosis Date Noted  . Chronic pain syndrome 09/18/2015  . Preventative health care 03/12/2015  . Family history of coronary artery disease in father 12/14/2014  . Acute osteomyelitis of toe of left foot ([place])   . Foot ulcer, left ([place]) 12/13/2014  . Paroxysmal atrial fibrillation ([place]) 12/13/2014  . Elevated alkaline phosphatase level 06/11/2014  . Constipation 05/31/2014  . Vitamin D deficiency 11/17/2013  . Anemia 02/10/2012  . Hypertension 11/18/2011  . Wheezing 06/08/2011  . Obstructive sleep apnea 12/12/2009  . GERD 06/27/2008  . Diabetes type 2, uncontrolled ([place]) 12/28/2007  . DIABETIC MACULAR EDEMA 10/07/2007  . Dyslipidemia 08/15/2007  . BACKGROUND DIABETIC RETINOPATHY 08/15/2007  . ERECTILE DYSFUNCTION 05/31/2007  . HEARING LOSS, SENSORINEURAL, BILATERAL 01/03/2007  . Diabetic peripheral neuropathy associated with type 2 diabetes mellitus ([place]) 12/16/2006  . Chronic kidney disease, stage III (moderate) 12/01/2006  . Morbid obesity ([place]) 08/23/2006  . STATUS, OTHER TOE(S) AMPUTATION 05/21/2006    Current Outpatient Prescriptions on File Prior to Visit  Medication Sig Dispense Refill  . ACCU-CHEK AVIVA PLUS test strip USE TO CHECK BLOOD SUGAR FOUR TIMES DAILY BEFORE MEALS AND AT BEDTIME 150 each 3  . ACCU-CHEK FASTCLIX LANCETS MISC Check blood sugar 4 times a day before meals and bedtime dx code 250.02 insulin requiring 102 each 6  . apixaban (ELIQUIS) 5 MG TABS tablet Take 1  tablet (5 mg total) by mouth 2 (two) times daily. 180 tablet 2  . atorvastatin (LIPITOR) 20 MG tablet TAKE 1 TABLET(20 MG) BY MOUTH DAILY 90 tablet 3  . BESIVANCE 0.6 % SUSP Place 1 drop into both eyes 4 (four) times daily.   12  . Blood Glucose Monitoring Suppl (ACCU-CHEK AVIVA PLUS) W/DEVICE KIT 1 each by Does not apply route 4 (four) times daily -  with meals and at bedtime. 1 kit 0  . Cholecalciferol (VITAMIN D3) 2000 UNITS capsule Take 1 capsule (2,000 Units total) by mouth daily. 90 capsule 0  . diclofenac sodium (VOLTAREN) 1 % GEL Apply 4 g topically 4 (four) times daily. 100 g 2  . docusate sodium (COLACE) 100 MG capsule Take 1 capsule (100 mg total) by mouth daily as needed for mild constipation. 14 capsule 0  . enalapril (VASOTEC) 20 MG tablet TAKE 2 TABLETS(40 MG) BY MOUTH DAILY 180 tablet 1  . furosemide (LASIX) 40 MG tablet TAKE 1 TABLET(40 MG) BY MOUTH TWICE DAILY 180 tablet 1  . gabapentin (NEURONTIN) 300 MG capsule Take 1 capsule (300 mg total) by mouth at bedtime. 30 capsule 3  . HUMULIN R U-500 KWIKPEN 500 UNIT/ML kwikpen INJECT 20 UNITS AT BREAKFAST, 60 UNITS AT LUNCH, AND 70 UNITS AT DINNER 18 mL 0  . HYDROcodone-acetaminophen (NORCO) 7.5-325 MG tablet Take 1 tablet by mouth every 6 (six) hours as needed for moderate pain. 130 tablet 0  . Insulin Glargine (TOUJEO SOLOSTAR) 300 UNIT/ML SOPN Inject 60-90 Units into the skin 2 (two) times daily. (Patient taking differently: Inject 70-80 Units  into the skin 2 (two) times daily. ) 45 pen 2  . Insulin Pen Needle 31G X 5 MM MISC Use for insulin injection 5 times a day. 150 each 5  . LUMIGAN 0.01 % SOLN Place 1 drop into both eyes at bedtime.     . metoprolol succinate (TOPROL-XL) 25 MG 24 hr tablet Take 1 tablet (25 mg total) by mouth daily. 90 tablet 3  . omeprazole (PRILOSEC) 20 MG capsule TAKE 1 CAPSULE(20 MG) BY MOUTH DAILY 90 capsule 1  . PROAIR HFA 108 (90 Base) MCG/ACT inhaler INHALE 2 PUFFS INTO THE LUNGS BY MOUTH EVERY 6 HOURS  AS NEEDED FOR WHEEZING OR SHORTNESS OF BREATH 8.5 g 0  . promethazine (PHENERGAN) 25 MG tablet Take 1 tablet (25 mg total) by mouth every 8 (eight) hours as needed for nausea or vomiting. 30 tablet 2  . silver sulfADIAZINE (SILVADENE) 1 % cream Apply 1 application topically daily. 50 g 1  . SIMBRINZA 1-0.2 % SUSP Place 1 drop into both eyes 2 (two) times daily. Take as instructed by Dr. Katy Fitch.    Marland Kitchen VICTOZA 18 MG/3ML SOPN INJECT 1.8 MG ONCE DAILY AT THE SAME TIME 9 mL 0   No current facility-administered medications on file prior to visit.     Allergies  Allergen Reactions  . Vancomycin     REACTION: ARF    Objective: There were no vitals filed for this visit.  General: No acute distress, AAOx3  Left foot: Plantar forefoot, wound graft fully incorporated with granular wound bed measuring 2.5x4x0.2cm (same as previous) with mild periwound maceration, swelling to left foot, no erythema, no warmth, no active drainage however on inner guaze there is drainage, no acute signs of infection noted, Capillary fill time <3 seconds in all digits remaining, amputation status of 2nd and 3rd toes. No pain with calf compression.   Assessment and Plan:  Problem List Items Addressed This Visit    None    Visit Diagnoses    Diabetic ulcer of left foot associated with type 2 diabetes mellitus, with fat layer exposed, unspecified part of foot ([place])    -  Primary   Foot amputation status, left ([place])       Type 2 diabetes mellitus with diabetic neuropathy, unspecified long term insulin use status ([place])           -Patient seen and evaluated -Cleansed wound debirded wound margins excisionlly to healthy bleeding margins and applied Grafix core 3x4cm in total to wound bed, lot no [id number], unit no [id number], exp Jan 20 2019, part no [id number] without waste, secured with adaptic and steristrips all covered with dry dressing -Advised patient to make sure to keep dressings clean, dry, and intact allowing nursing to  change outer gauze dressings only; do not touch steristrips or adaptic -Advised patient to continue with forefoot offloading post-op shoe on left foot with use of cane -Advised patient to limit activity to necessity  -Advised patient to ice and elevate as necessary  -Will plan for re-application of Osiris Grafix to wound bed at next visit. In the meantime, patient to call office if any issues or problems arise.   Landis Martins, DPM

## 2016-09-29 NOTE — Telephone Encounter (Signed)
Review chart 

## 2016-09-30 DIAGNOSIS — E1142 Type 2 diabetes mellitus with diabetic polyneuropathy: Secondary | ICD-10-CM | POA: Diagnosis not present

## 2016-09-30 DIAGNOSIS — Z89422 Acquired absence of other left toe(s): Secondary | ICD-10-CM | POA: Diagnosis not present

## 2016-09-30 DIAGNOSIS — N183 Chronic kidney disease, stage 3 (moderate): Secondary | ICD-10-CM | POA: Diagnosis not present

## 2016-09-30 DIAGNOSIS — I129 Hypertensive chronic kidney disease with stage 1 through stage 4 chronic kidney disease, or unspecified chronic kidney disease: Secondary | ICD-10-CM | POA: Diagnosis not present

## 2016-09-30 DIAGNOSIS — Z4800 Encounter for change or removal of nonsurgical wound dressing: Secondary | ICD-10-CM | POA: Diagnosis not present

## 2016-09-30 DIAGNOSIS — Z794 Long term (current) use of insulin: Secondary | ICD-10-CM | POA: Diagnosis not present

## 2016-09-30 DIAGNOSIS — E1122 Type 2 diabetes mellitus with diabetic chronic kidney disease: Secondary | ICD-10-CM | POA: Diagnosis not present

## 2016-09-30 DIAGNOSIS — L97522 Non-pressure chronic ulcer of other part of left foot with fat layer exposed: Secondary | ICD-10-CM | POA: Diagnosis not present

## 2016-09-30 DIAGNOSIS — E11621 Type 2 diabetes mellitus with foot ulcer: Secondary | ICD-10-CM | POA: Diagnosis not present

## 2016-09-30 DIAGNOSIS — Z7901 Long term (current) use of anticoagulants: Secondary | ICD-10-CM | POA: Diagnosis not present

## 2016-09-30 DIAGNOSIS — E11311 Type 2 diabetes mellitus with unspecified diabetic retinopathy with macular edema: Secondary | ICD-10-CM | POA: Diagnosis not present

## 2016-10-02 ENCOUNTER — Telehealth: Payer: Self-pay | Admitting: Internal Medicine

## 2016-10-02 DIAGNOSIS — E1142 Type 2 diabetes mellitus with diabetic polyneuropathy: Secondary | ICD-10-CM | POA: Diagnosis not present

## 2016-10-02 DIAGNOSIS — N183 Chronic kidney disease, stage 3 (moderate): Secondary | ICD-10-CM | POA: Diagnosis not present

## 2016-10-02 DIAGNOSIS — Z4800 Encounter for change or removal of nonsurgical wound dressing: Secondary | ICD-10-CM | POA: Diagnosis not present

## 2016-10-02 DIAGNOSIS — I129 Hypertensive chronic kidney disease with stage 1 through stage 4 chronic kidney disease, or unspecified chronic kidney disease: Secondary | ICD-10-CM | POA: Diagnosis not present

## 2016-10-02 DIAGNOSIS — L97522 Non-pressure chronic ulcer of other part of left foot with fat layer exposed: Secondary | ICD-10-CM | POA: Diagnosis not present

## 2016-10-02 DIAGNOSIS — Z7901 Long term (current) use of anticoagulants: Secondary | ICD-10-CM | POA: Diagnosis not present

## 2016-10-02 DIAGNOSIS — Z794 Long term (current) use of insulin: Secondary | ICD-10-CM | POA: Diagnosis not present

## 2016-10-02 DIAGNOSIS — Z89422 Acquired absence of other left toe(s): Secondary | ICD-10-CM | POA: Diagnosis not present

## 2016-10-02 DIAGNOSIS — E11311 Type 2 diabetes mellitus with unspecified diabetic retinopathy with macular edema: Secondary | ICD-10-CM | POA: Diagnosis not present

## 2016-10-02 DIAGNOSIS — E1122 Type 2 diabetes mellitus with diabetic chronic kidney disease: Secondary | ICD-10-CM | POA: Diagnosis not present

## 2016-10-02 DIAGNOSIS — E11621 Type 2 diabetes mellitus with foot ulcer: Secondary | ICD-10-CM | POA: Diagnosis not present

## 2016-10-02 NOTE — Telephone Encounter (Signed)
Rec'd phone call from [facility] "Leafy Ro" stating the his Medicare Insurance is denying the 05/14/2016 office notes that were submitted for his CPAP.  Please addend your notes if possible to state the usage of the CPAP machine.  Notes must mention "OSA on CPAP benefiting and complaint on C-PAP Therapy in order for his machine to be paid for. This can also be updated on the patient history as well.  Without this being done the patient will be responsible for the payment in full for the machine.  "Please advise."

## 2016-10-02 NOTE — Telephone Encounter (Signed)
I addended my note. Let me know if you need anything else for this. Thank you Chilon!

## 2016-10-05 ENCOUNTER — Telehealth: Payer: Self-pay | Admitting: *Deleted

## 2016-10-05 ENCOUNTER — Other Ambulatory Visit: Payer: Self-pay | Admitting: Endocrinology

## 2016-10-05 DIAGNOSIS — E1142 Type 2 diabetes mellitus with diabetic polyneuropathy: Secondary | ICD-10-CM | POA: Diagnosis not present

## 2016-10-05 DIAGNOSIS — Z794 Long term (current) use of insulin: Secondary | ICD-10-CM | POA: Diagnosis not present

## 2016-10-05 DIAGNOSIS — Z7901 Long term (current) use of anticoagulants: Secondary | ICD-10-CM | POA: Diagnosis not present

## 2016-10-05 DIAGNOSIS — N183 Chronic kidney disease, stage 3 (moderate): Secondary | ICD-10-CM | POA: Diagnosis not present

## 2016-10-05 DIAGNOSIS — Z89422 Acquired absence of other left toe(s): Secondary | ICD-10-CM | POA: Diagnosis not present

## 2016-10-05 DIAGNOSIS — E1122 Type 2 diabetes mellitus with diabetic chronic kidney disease: Secondary | ICD-10-CM | POA: Diagnosis not present

## 2016-10-05 DIAGNOSIS — E11311 Type 2 diabetes mellitus with unspecified diabetic retinopathy with macular edema: Secondary | ICD-10-CM | POA: Diagnosis not present

## 2016-10-05 DIAGNOSIS — L97522 Non-pressure chronic ulcer of other part of left foot with fat layer exposed: Secondary | ICD-10-CM | POA: Diagnosis not present

## 2016-10-05 DIAGNOSIS — E11621 Type 2 diabetes mellitus with foot ulcer: Secondary | ICD-10-CM | POA: Diagnosis not present

## 2016-10-05 DIAGNOSIS — I129 Hypertensive chronic kidney disease with stage 1 through stage 4 chronic kidney disease, or unspecified chronic kidney disease: Secondary | ICD-10-CM | POA: Diagnosis not present

## 2016-10-05 DIAGNOSIS — Z4800 Encounter for change or removal of nonsurgical wound dressing: Secondary | ICD-10-CM | POA: Diagnosis not present

## 2016-10-05 NOTE — Telephone Encounter (Addendum)
-----   Message from Landis Martins, [place] sent at 09/30/2016  8:53 AM EDT ----- Regarding: Reorder grafix for next week Per Elta [place] Patient is approved for 10 grafts Reorder for next week I spoke with patient's home nurse who knows to only change the gauze dressing layers while graft is in place -Dr. Cannon Kettle. 10/05/2016-Email to De Nurse for STAT Osiris Grafix.10/07/2016-ordered 3rd Grafix for 10/13/2016 at 11:00am.10/13/2016-Emailed order for Grafix to [employer]. 10/26/2016-Crystal - [facility] states at pt home to change outer dressing and it was saturated with odor, and the adaptic had come, so she replaced the adaptic and outer dressing.10/27/2016-Reordered 6th Grafix by email. 11/03/2016-Emailed order for 7th Grafix. Faxed copy of Dr.Stover's 11/03/2016 11:36am orders to [facility].11/10/2016-Emailed order for 8th Grafix for 11/17/2016 at 3:30pm appt. 11/12/2016-Angela - [facility] states they have reinstated pt for wound care 3x week for 9 wks and will continue with Dr. Leeanne Rio wound care orders. Left message okaying the reinstatement orders per Dr. Cannon Kettle.11/27/2016-Emailed order for 9th Grafix from [employer].

## 2016-10-06 ENCOUNTER — Ambulatory Visit (INDEPENDENT_AMBULATORY_CARE_PROVIDER_SITE_OTHER): Payer: Medicare Other | Admitting: Sports Medicine

## 2016-10-06 DIAGNOSIS — E11621 Type 2 diabetes mellitus with foot ulcer: Secondary | ICD-10-CM | POA: Diagnosis not present

## 2016-10-06 DIAGNOSIS — M79672 Pain in left foot: Secondary | ICD-10-CM

## 2016-10-06 DIAGNOSIS — L97522 Non-pressure chronic ulcer of other part of left foot with fat layer exposed: Secondary | ICD-10-CM | POA: Diagnosis not present

## 2016-10-06 DIAGNOSIS — Z9889 Other specified postprocedural states: Secondary | ICD-10-CM

## 2016-10-06 DIAGNOSIS — IMO0002 Reserved for concepts with insufficient information to code with codable children: Secondary | ICD-10-CM

## 2016-10-06 DIAGNOSIS — Z89432 Acquired absence of left foot: Secondary | ICD-10-CM

## 2016-10-06 DIAGNOSIS — E1140 Type 2 diabetes mellitus with diabetic neuropathy, unspecified: Secondary | ICD-10-CM

## 2016-10-06 NOTE — Progress Notes (Signed)
Subjective: [name] is a 66 y.o. male patient seen today in office for POV #13 (DOS 06-15-16), S/P Left wound debridement and placement of stravix allograft. Patient had grafix #1 placed last visit, denies pain at surgical site, denies calf pain, denies headache, chest pain, shortness of breath, nausea, vomiting, fever, or chills. Patient states that he is doing well with no issues. Has home nursing every other day. No other issues noted.   Patient Active Problem List   Diagnosis Date Noted  . Chronic pain syndrome 09/18/2015  . Preventative health care 03/12/2015  . Family history of coronary artery disease in father 12/14/2014  . Acute osteomyelitis of toe of left foot ([place])   . Foot ulcer, left ([place]) 12/13/2014  . Paroxysmal atrial fibrillation ([place]) 12/13/2014  . Elevated alkaline phosphatase level 06/11/2014  . Constipation 05/31/2014  . Vitamin D deficiency 11/17/2013  . Anemia 02/10/2012  . Hypertension 11/18/2011  . Wheezing 06/08/2011  . Obstructive sleep apnea 12/12/2009  . GERD 06/27/2008  . Diabetes type 2, uncontrolled ([place]) 12/28/2007  . DIABETIC MACULAR EDEMA 10/07/2007  . Dyslipidemia 08/15/2007  . BACKGROUND DIABETIC RETINOPATHY 08/15/2007  . ERECTILE DYSFUNCTION 05/31/2007  . HEARING LOSS, SENSORINEURAL, BILATERAL 01/03/2007  . Diabetic peripheral neuropathy associated with type 2 diabetes mellitus ([place]) 12/16/2006  . Chronic kidney disease, stage III (moderate) 12/01/2006  . Morbid obesity ([place]) 08/23/2006  . STATUS, OTHER TOE(S) AMPUTATION 05/21/2006    Current Outpatient Prescriptions on File Prior to Visit  Medication Sig Dispense Refill  . ACCU-CHEK AVIVA PLUS test strip USE TO CHECK BLOOD SUGAR FOUR TIMES DAILY BEFORE MEALS AND AT BEDTIME 150 each 3  . ACCU-CHEK FASTCLIX LANCETS MISC Check blood sugar 4 times a day before meals and bedtime dx code 250.02 insulin requiring 102 each 6  . apixaban (ELIQUIS) 5 MG TABS tablet Take 1 tablet  (5 mg total) by mouth 2 (two) times daily. 180 tablet 2  . atorvastatin (LIPITOR) 20 MG tablet TAKE 1 TABLET(20 MG) BY MOUTH DAILY 90 tablet 3  . BESIVANCE 0.6 % SUSP Place 1 drop into both eyes 4 (four) times daily.   12  . Blood Glucose Monitoring Suppl (ACCU-CHEK AVIVA PLUS) W/DEVICE KIT 1 each by Does not apply route 4 (four) times daily -  with meals and at bedtime. 1 kit 0  . Cholecalciferol (VITAMIN D3) 2000 UNITS capsule Take 1 capsule (2,000 Units total) by mouth daily. 90 capsule 0  . diclofenac sodium (VOLTAREN) 1 % GEL Apply 4 g topically 4 (four) times daily. 100 g 2  . docusate sodium (COLACE) 100 MG capsule Take 1 capsule (100 mg total) by mouth daily as needed for mild constipation. 14 capsule 0  . enalapril (VASOTEC) 20 MG tablet TAKE 2 TABLETS(40 MG) BY MOUTH DAILY 180 tablet 1  . furosemide (LASIX) 40 MG tablet TAKE 1 TABLET(40 MG) BY MOUTH TWICE DAILY 180 tablet 1  . gabapentin (NEURONTIN) 300 MG capsule Take 1 capsule (300 mg total) by mouth at bedtime. 30 capsule 3  . HUMULIN R U-500 KWIKPEN 500 UNIT/ML kwikpen INJECT 20 UNITS AT BREAKFAST, 60 UNITS AT LUNCH, AND 70 UNITS AT DINNER 18 mL 0  . HYDROcodone-acetaminophen (NORCO) 7.5-325 MG tablet Take 1 tablet by mouth every 6 (six) hours as needed for moderate pain. 130 tablet 0  . Insulin Glargine (TOUJEO SOLOSTAR) 300 UNIT/ML SOPN Inject 60-90 Units into the skin 2 (two) times daily. (Patient taking differently: Inject 70-80 Units  into the skin 2 (two) times daily. ) 45 pen 2  . Insulin Pen Needle 31G X 5 MM MISC Use for insulin injection 5 times a day. 150 each 5  . LUMIGAN 0.01 % SOLN Place 1 drop into both eyes at bedtime.     . metoprolol succinate (TOPROL-XL) 25 MG 24 hr tablet Take 1 tablet (25 mg total) by mouth daily. 90 tablet 3  . omeprazole (PRILOSEC) 20 MG capsule TAKE 1 CAPSULE(20 MG) BY MOUTH DAILY 90 capsule 1  . PROAIR HFA 108 (90 Base) MCG/ACT inhaler INHALE 2 PUFFS INTO THE LUNGS BY MOUTH EVERY 6 HOURS AS  NEEDED FOR WHEEZING OR SHORTNESS OF BREATH 8.5 g 6  . promethazine (PHENERGAN) 25 MG tablet Take 1 tablet (25 mg total) by mouth every 8 (eight) hours as needed for nausea or vomiting. 30 tablet 2  . silver sulfADIAZINE (SILVADENE) 1 % cream Apply 1 application topically daily. 50 g 1  . SIMBRINZA 1-0.2 % SUSP Place 1 drop into both eyes 2 (two) times daily. Take as instructed by Dr. Katy Fitch.    Marland Kitchen VICTOZA 18 MG/3ML SOPN ADMINISTER 1.8 MG UNDER THE SKIN AT THE SAME TIME EVERY DAY 9 mL 0   No current facility-administered medications on file prior to visit.     Allergies  Allergen Reactions  . Vancomycin     REACTION: ARF    Objective: There were no vitals filed for this visit.  General: No acute distress, AAOx3  Left foot: Plantar forefoot, wound graft fully incorporated with granular wound bed measuring 3.5x2x0.2cm (smaller than previous) with mild periwound maceration, swelling to left foot, no erythema, no warmth, no active drainage however on inner guaze there is drainage, no acute signs of infection noted, Capillary fill time <3 seconds in all digits remaining, amputation status of 2nd and 3rd toes. No pain with calf compression.   Assessment and Plan:  Problem List Items Addressed This Visit    None    Visit Diagnoses    Diabetic ulcer of left foot associated with type 2 diabetes mellitus, with fat layer exposed, unspecified part of foot ([place])    -  Primary   Foot amputation status, left ([place])       Type 2 diabetes mellitus with diabetic neuropathy, unspecified long term insulin use status ([place])       S/P foot surgery, left       Foot pain, left           -Patient seen and evaluated -Cleansed wound debirded wound margins excisionlly to healthy bleeding margins and applied Grafix core 3x4cm (graft #2 to site) in total to wound bed, lot no [id number], unit no [id number], exp Aug 08 2019, part no [id number] without waste, secured with adaptic and steristrips all covered with dry  dressing -Advised patient to make sure to keep dressings clean, dry, and intact allowing nursing to change outer gauze dressings only; do not touch steristrips or adaptic -Advised patient to continue with forefoot offloading post-op shoe on left foot with use of cane -Advised patient to limit activity to necessity  -Advised patient to ice and elevate as necessary  -Will plan for re-application of Osiris Grafix to wound bed at next visit. In the meantime, patient to call office if any issues or problems arise.   Landis Martins, DPM

## 2016-10-07 DIAGNOSIS — E11621 Type 2 diabetes mellitus with foot ulcer: Secondary | ICD-10-CM | POA: Diagnosis not present

## 2016-10-07 DIAGNOSIS — L97522 Non-pressure chronic ulcer of other part of left foot with fat layer exposed: Secondary | ICD-10-CM | POA: Diagnosis not present

## 2016-10-07 DIAGNOSIS — N183 Chronic kidney disease, stage 3 (moderate): Secondary | ICD-10-CM | POA: Diagnosis not present

## 2016-10-07 DIAGNOSIS — Z89422 Acquired absence of other left toe(s): Secondary | ICD-10-CM | POA: Diagnosis not present

## 2016-10-07 DIAGNOSIS — I129 Hypertensive chronic kidney disease with stage 1 through stage 4 chronic kidney disease, or unspecified chronic kidney disease: Secondary | ICD-10-CM | POA: Diagnosis not present

## 2016-10-07 DIAGNOSIS — E1122 Type 2 diabetes mellitus with diabetic chronic kidney disease: Secondary | ICD-10-CM | POA: Diagnosis not present

## 2016-10-07 DIAGNOSIS — Z4800 Encounter for change or removal of nonsurgical wound dressing: Secondary | ICD-10-CM | POA: Diagnosis not present

## 2016-10-07 DIAGNOSIS — Z794 Long term (current) use of insulin: Secondary | ICD-10-CM | POA: Diagnosis not present

## 2016-10-07 DIAGNOSIS — E11311 Type 2 diabetes mellitus with unspecified diabetic retinopathy with macular edema: Secondary | ICD-10-CM | POA: Diagnosis not present

## 2016-10-07 DIAGNOSIS — E1142 Type 2 diabetes mellitus with diabetic polyneuropathy: Secondary | ICD-10-CM | POA: Diagnosis not present

## 2016-10-07 DIAGNOSIS — Z7901 Long term (current) use of anticoagulants: Secondary | ICD-10-CM | POA: Diagnosis not present

## 2016-10-08 ENCOUNTER — Other Ambulatory Visit: Payer: Self-pay | Admitting: Endocrinology

## 2016-10-08 ENCOUNTER — Other Ambulatory Visit: Payer: Self-pay | Admitting: Internal Medicine

## 2016-10-08 ENCOUNTER — Other Ambulatory Visit: Payer: Self-pay | Admitting: Student in an Organized Health Care Education/Training Program

## 2016-10-08 DIAGNOSIS — I10 Essential (primary) hypertension: Secondary | ICD-10-CM

## 2016-10-09 ENCOUNTER — Telehealth: Payer: Self-pay | Admitting: Family Medicine

## 2016-10-09 DIAGNOSIS — Z794 Long term (current) use of insulin: Secondary | ICD-10-CM | POA: Diagnosis not present

## 2016-10-09 DIAGNOSIS — I129 Hypertensive chronic kidney disease with stage 1 through stage 4 chronic kidney disease, or unspecified chronic kidney disease: Secondary | ICD-10-CM | POA: Diagnosis not present

## 2016-10-09 DIAGNOSIS — E11621 Type 2 diabetes mellitus with foot ulcer: Secondary | ICD-10-CM | POA: Diagnosis not present

## 2016-10-09 DIAGNOSIS — Z7901 Long term (current) use of anticoagulants: Secondary | ICD-10-CM | POA: Diagnosis not present

## 2016-10-09 DIAGNOSIS — E1122 Type 2 diabetes mellitus with diabetic chronic kidney disease: Secondary | ICD-10-CM | POA: Diagnosis not present

## 2016-10-09 DIAGNOSIS — L97522 Non-pressure chronic ulcer of other part of left foot with fat layer exposed: Secondary | ICD-10-CM | POA: Diagnosis not present

## 2016-10-09 DIAGNOSIS — Z89422 Acquired absence of other left toe(s): Secondary | ICD-10-CM | POA: Diagnosis not present

## 2016-10-09 DIAGNOSIS — Z4800 Encounter for change or removal of nonsurgical wound dressing: Secondary | ICD-10-CM | POA: Diagnosis not present

## 2016-10-09 DIAGNOSIS — E11311 Type 2 diabetes mellitus with unspecified diabetic retinopathy with macular edema: Secondary | ICD-10-CM | POA: Diagnosis not present

## 2016-10-09 DIAGNOSIS — N183 Chronic kidney disease, stage 3 (moderate): Secondary | ICD-10-CM | POA: Diagnosis not present

## 2016-10-09 DIAGNOSIS — E1142 Type 2 diabetes mellitus with diabetic polyneuropathy: Secondary | ICD-10-CM | POA: Diagnosis not present

## 2016-10-09 NOTE — Telephone Encounter (Signed)
Phone call from [facility] health regarding his insulin; Called pt back.  He is on toujea and also humulin R.  A refill of Humulin R was ordered yesterday.  He is concerned as he also got an rx for [employer] and he did not think he was using this med any longer.  I am not able to find documentation of humalog being rx-  ? Was done in error Pt reports that his blood glucose is under good control currently.  Advised him to stay with his usual regimen of humulin R and I will send a message to Dr. Dwyane Dee for him to see next week.    Dr. Dwyane Dee please advise pt is he is supposed to change to Humalog.  Thank you!

## 2016-10-11 DIAGNOSIS — Z794 Long term (current) use of insulin: Secondary | ICD-10-CM | POA: Diagnosis not present

## 2016-10-11 DIAGNOSIS — Z7901 Long term (current) use of anticoagulants: Secondary | ICD-10-CM | POA: Diagnosis not present

## 2016-10-11 DIAGNOSIS — L97522 Non-pressure chronic ulcer of other part of left foot with fat layer exposed: Secondary | ICD-10-CM | POA: Diagnosis not present

## 2016-10-11 DIAGNOSIS — E11621 Type 2 diabetes mellitus with foot ulcer: Secondary | ICD-10-CM | POA: Diagnosis not present

## 2016-10-11 DIAGNOSIS — I129 Hypertensive chronic kidney disease with stage 1 through stage 4 chronic kidney disease, or unspecified chronic kidney disease: Secondary | ICD-10-CM | POA: Diagnosis not present

## 2016-10-11 DIAGNOSIS — N183 Chronic kidney disease, stage 3 (moderate): Secondary | ICD-10-CM | POA: Diagnosis not present

## 2016-10-11 DIAGNOSIS — Z89422 Acquired absence of other left toe(s): Secondary | ICD-10-CM | POA: Diagnosis not present

## 2016-10-11 DIAGNOSIS — E1142 Type 2 diabetes mellitus with diabetic polyneuropathy: Secondary | ICD-10-CM | POA: Diagnosis not present

## 2016-10-11 DIAGNOSIS — Z4800 Encounter for change or removal of nonsurgical wound dressing: Secondary | ICD-10-CM | POA: Diagnosis not present

## 2016-10-11 DIAGNOSIS — E1122 Type 2 diabetes mellitus with diabetic chronic kidney disease: Secondary | ICD-10-CM | POA: Diagnosis not present

## 2016-10-11 DIAGNOSIS — E11311 Type 2 diabetes mellitus with unspecified diabetic retinopathy with macular edema: Secondary | ICD-10-CM | POA: Diagnosis not present

## 2016-10-11 NOTE — Telephone Encounter (Signed)
This patient does not have a follow-up visit, this is due now with labs Please schedule He is not supposed to be on Humalog as mentioned  in the telephone encounter

## 2016-10-12 ENCOUNTER — Telehealth: Payer: Self-pay | Admitting: Endocrinology

## 2016-10-12 NOTE — Telephone Encounter (Signed)
[facility] Call: Caller states he is needing to clarify which insulin he needs to take.

## 2016-10-12 NOTE — Telephone Encounter (Signed)
LM for pt to call back to schedule °

## 2016-10-13 ENCOUNTER — Ambulatory Visit (INDEPENDENT_AMBULATORY_CARE_PROVIDER_SITE_OTHER): Payer: Medicare Other | Admitting: Sports Medicine

## 2016-10-13 ENCOUNTER — Ambulatory Visit: Payer: Medicare Other

## 2016-10-13 ENCOUNTER — Ambulatory Visit: Payer: Medicare Other | Admitting: Internal Medicine

## 2016-10-13 ENCOUNTER — Encounter: Payer: Self-pay | Admitting: Sports Medicine

## 2016-10-13 DIAGNOSIS — E1140 Type 2 diabetes mellitus with diabetic neuropathy, unspecified: Secondary | ICD-10-CM

## 2016-10-13 DIAGNOSIS — IMO0002 Reserved for concepts with insufficient information to code with codable children: Secondary | ICD-10-CM

## 2016-10-13 DIAGNOSIS — E11621 Type 2 diabetes mellitus with foot ulcer: Secondary | ICD-10-CM

## 2016-10-13 DIAGNOSIS — Z89432 Acquired absence of left foot: Secondary | ICD-10-CM

## 2016-10-13 DIAGNOSIS — L97522 Non-pressure chronic ulcer of other part of left foot with fat layer exposed: Secondary | ICD-10-CM

## 2016-10-13 NOTE — Telephone Encounter (Signed)
-----   Message from Landis Martins, [place] sent at 10/13/2016 11:33 AM EDT ----- Regarding: reorder grafix for next week

## 2016-10-13 NOTE — Progress Notes (Signed)
Subjective: [name] is a 66 y.o. male patient seen today in office for POV #14 (DOS 06-15-16), S/P Left wound debridement and placement of stravix allograft. Patient had grafix #2 placed last visit, denies pain at surgical site, denies calf pain, denies headache, chest pain, shortness of breath, nausea, vomiting, fever, or chills. Patient states that he is doing well with no issues. Has home nursing every other day. No other issues noted.   Patient Active Problem List   Diagnosis Date Noted  . Chronic pain syndrome 09/18/2015  . Preventative health care 03/12/2015  . Family history of coronary artery disease in father 12/14/2014  . Acute osteomyelitis of toe of left foot ([place])   . Foot ulcer, left ([place]) 12/13/2014  . Paroxysmal atrial fibrillation ([place]) 12/13/2014  . Elevated alkaline phosphatase level 06/11/2014  . Constipation 05/31/2014  . Vitamin D deficiency 11/17/2013  . Anemia 02/10/2012  . Hypertension 11/18/2011  . Wheezing 06/08/2011  . Obstructive sleep apnea 12/12/2009  . GERD 06/27/2008  . Diabetes type 2, uncontrolled ([place]) 12/28/2007  . DIABETIC MACULAR EDEMA 10/07/2007  . Dyslipidemia 08/15/2007  . BACKGROUND DIABETIC RETINOPATHY 08/15/2007  . ERECTILE DYSFUNCTION 05/31/2007  . HEARING LOSS, SENSORINEURAL, BILATERAL 01/03/2007  . Diabetic peripheral neuropathy associated with type 2 diabetes mellitus ([place]) 12/16/2006  . Chronic kidney disease, stage III (moderate) 12/01/2006  . Morbid obesity ([place]) 08/23/2006  . STATUS, OTHER TOE(S) AMPUTATION 05/21/2006    Current Outpatient Prescriptions on File Prior to Visit  Medication Sig Dispense Refill  . ACCU-CHEK AVIVA PLUS test strip USE TO CHECK BLOOD FOUR TIMES DAILY DBEFORE MEALS AND AT BEDTIME 150 each 3  . ACCU-CHEK FASTCLIX LANCETS MISC Check blood sugar 4 times a day before meals and bedtime dx code 250.02 insulin requiring 102 each 6  . atorvastatin (LIPITOR) 20 MG tablet TAKE 1 TABLET(20  MG) BY MOUTH DAILY 90 tablet 3  . BESIVANCE 0.6 % SUSP Place 1 drop into both eyes 4 (four) times daily.   12  . Blood Glucose Monitoring Suppl (ACCU-CHEK AVIVA PLUS) W/DEVICE KIT 1 each by Does not apply route 4 (four) times daily -  with meals and at bedtime. 1 kit 0  . Cholecalciferol (VITAMIN D3) 2000 UNITS capsule Take 1 capsule (2,000 Units total) by mouth daily. 90 capsule 0  . diclofenac sodium (VOLTAREN) 1 % GEL Apply 4 g topically 4 (four) times daily. 100 g 2  . docusate sodium (COLACE) 100 MG capsule Take 1 capsule (100 mg total) by mouth daily as needed for mild constipation. 14 capsule 0  . ELIQUIS 5 MG TABS tablet TAKE 1 TABLET(5 MG) BY MOUTH TWICE DAILY 180 tablet 3  . enalapril (VASOTEC) 20 MG tablet TAKE 2 TABLETS(40 MG) BY MOUTH DAILY 180 tablet 1  . furosemide (LASIX) 40 MG tablet TAKE 1 TABLET(40 MG) BY MOUTH TWICE DAILY 60 tablet 3  . gabapentin (NEURONTIN) 300 MG capsule Take 1 capsule (300 mg total) by mouth at bedtime. 30 capsule 3  . HUMALOG KWIKPEN 100 UNIT/ML KiwkPen SEE NOTES 60 mL 0  . HUMULIN R U-500 KWIKPEN 500 UNIT/ML kwikpen INJECT 20 UNITS AT BREAKFAST, 60 UNITS AT LUNCH, AND 70 UNITS AT DINNER 18 mL 0  . HUMULIN R U-500 KWIKPEN 500 UNIT/ML kwikpen INJECT 15 UNITS SUBCUTANEOUSLY AT BREAKFAST, 40 UNITS AT LUNCH AND 50 UNITS AT DINNER 6 mL 0  . HYDROcodone-acetaminophen (NORCO) 7.5-325 MG tablet Take 1 tablet by mouth every 6 (  six) hours as needed for moderate pain. 130 tablet 0  . Insulin Glargine (TOUJEO SOLOSTAR) 300 UNIT/ML SOPN Inject 60-90 Units into the skin 2 (two) times daily. (Patient taking differently: Inject 70-80 Units into the skin 2 (two) times daily. ) 45 pen 2  . Insulin Pen Needle 31G X 5 MM MISC Use for insulin injection 5 times a day. 150 each 5  . LUMIGAN 0.01 % SOLN Place 1 drop into both eyes at bedtime.     . metoprolol succinate (TOPROL-XL) 25 MG 24 hr tablet TAKE 1 TABLET(25 MG) BY MOUTH DAILY 90 tablet 2  . omeprazole (PRILOSEC) 20 MG  capsule TAKE 1 CAPSULE(20 MG) BY MOUTH DAILY 90 capsule 1  . PROAIR HFA 108 (90 Base) MCG/ACT inhaler INHALE 2 PUFFS INTO THE LUNGS BY MOUTH EVERY 6 HOURS AS NEEDED FOR WHEEZING OR SHORTNESS OF BREATH 8.5 g 6  . promethazine (PHENERGAN) 25 MG tablet Take 1 tablet (25 mg total) by mouth every 8 (eight) hours as needed for nausea or vomiting. 30 tablet 2  . silver sulfADIAZINE (SILVADENE) 1 % cream Apply 1 application topically daily. 50 g 1  . SIMBRINZA 1-0.2 % SUSP Place 1 drop into both eyes 2 (two) times daily. Take as instructed by Dr. Katy Fitch.    Nelva Nay SOLOSTAR 300 UNIT/ML SOPN INJECT 74 UNITS INTO SKIN TWICE DAILY 9 pen 0  . VICTOZA 18 MG/3ML SOPN ADMINISTER 1.8 MG UNDER THE SKIN AT THE SAME TIME EVERY DAY 9 mL 0   No current facility-administered medications on file prior to visit.     Allergies  Allergen Reactions  . Vancomycin     REACTION: ARF    Objective: There were no vitals filed for this visit.  General: No acute distress, AAOx3  Left foot: Plantar forefoot, wound graft fully incorporated with granular wound bed measuring 3.5x2x0.2cm (same as previous) with mild periwound maceration, swelling to left foot, no erythema, no warmth, no active drainage however on inner guaze there is drainage, no acute signs of infection noted, Capillary fill time <3 seconds in all digits remaining, amputation status of 2nd and 3rd toes. No pain with calf compression.   Assessment and Plan:  Problem List Items Addressed This Visit    None    Visit Diagnoses    Diabetic ulcer of left foot associated with type 2 diabetes mellitus, with fat layer exposed, unspecified part of foot ([place])    -  Primary   Foot amputation status, left ([place])       Type 2 diabetes mellitus with diabetic neuropathy, unspecified long term insulin use status ([place])           -Patient seen and evaluated -Cleansed wound debirded wound margins excisionlly to healthy bleeding margins and applied Grafix core 3x4cm (graft  #3 to site) in total to wound bed, lot no [id number], unit no [id number], exp October 01 2018, part no [id number] without waste, secured with adaptic and steristrips all covered with dry dressing -Advised patient to make sure to keep dressings clean, dry, and intact allowing nursing to change outer gauze dressings only; do not touch steristrips or adaptic -Advised patient to continue with forefoot offloading post-op shoe on left foot with use of cane -Advised patient to limit activity to necessity  -Advised patient to ice and elevate as necessary  -Will plan for re-application of Osiris Grafix to wound bed at next visit. In the meantime, patient to call office if any issues or problems arise.  Landis Martins, DPM

## 2016-10-14 DIAGNOSIS — E1122 Type 2 diabetes mellitus with diabetic chronic kidney disease: Secondary | ICD-10-CM | POA: Diagnosis not present

## 2016-10-14 DIAGNOSIS — Z4800 Encounter for change or removal of nonsurgical wound dressing: Secondary | ICD-10-CM | POA: Diagnosis not present

## 2016-10-14 DIAGNOSIS — E11621 Type 2 diabetes mellitus with foot ulcer: Secondary | ICD-10-CM | POA: Diagnosis not present

## 2016-10-14 DIAGNOSIS — Z7901 Long term (current) use of anticoagulants: Secondary | ICD-10-CM | POA: Diagnosis not present

## 2016-10-14 DIAGNOSIS — E11311 Type 2 diabetes mellitus with unspecified diabetic retinopathy with macular edema: Secondary | ICD-10-CM | POA: Diagnosis not present

## 2016-10-14 DIAGNOSIS — L97522 Non-pressure chronic ulcer of other part of left foot with fat layer exposed: Secondary | ICD-10-CM | POA: Diagnosis not present

## 2016-10-14 DIAGNOSIS — E1142 Type 2 diabetes mellitus with diabetic polyneuropathy: Secondary | ICD-10-CM | POA: Diagnosis not present

## 2016-10-14 DIAGNOSIS — I129 Hypertensive chronic kidney disease with stage 1 through stage 4 chronic kidney disease, or unspecified chronic kidney disease: Secondary | ICD-10-CM | POA: Diagnosis not present

## 2016-10-14 DIAGNOSIS — Z794 Long term (current) use of insulin: Secondary | ICD-10-CM | POA: Diagnosis not present

## 2016-10-14 DIAGNOSIS — Z89422 Acquired absence of other left toe(s): Secondary | ICD-10-CM | POA: Diagnosis not present

## 2016-10-14 DIAGNOSIS — N183 Chronic kidney disease, stage 3 (moderate): Secondary | ICD-10-CM | POA: Diagnosis not present

## 2016-10-15 DIAGNOSIS — G4733 Obstructive sleep apnea (adult) (pediatric): Secondary | ICD-10-CM | POA: Diagnosis not present

## 2016-10-16 DIAGNOSIS — Z794 Long term (current) use of insulin: Secondary | ICD-10-CM | POA: Diagnosis not present

## 2016-10-16 DIAGNOSIS — Z4800 Encounter for change or removal of nonsurgical wound dressing: Secondary | ICD-10-CM | POA: Diagnosis not present

## 2016-10-16 DIAGNOSIS — Z89422 Acquired absence of other left toe(s): Secondary | ICD-10-CM | POA: Diagnosis not present

## 2016-10-16 DIAGNOSIS — I129 Hypertensive chronic kidney disease with stage 1 through stage 4 chronic kidney disease, or unspecified chronic kidney disease: Secondary | ICD-10-CM | POA: Diagnosis not present

## 2016-10-16 DIAGNOSIS — Z7901 Long term (current) use of anticoagulants: Secondary | ICD-10-CM | POA: Diagnosis not present

## 2016-10-16 DIAGNOSIS — L97522 Non-pressure chronic ulcer of other part of left foot with fat layer exposed: Secondary | ICD-10-CM | POA: Diagnosis not present

## 2016-10-16 DIAGNOSIS — E11621 Type 2 diabetes mellitus with foot ulcer: Secondary | ICD-10-CM | POA: Diagnosis not present

## 2016-10-16 DIAGNOSIS — N183 Chronic kidney disease, stage 3 (moderate): Secondary | ICD-10-CM | POA: Diagnosis not present

## 2016-10-16 DIAGNOSIS — E1142 Type 2 diabetes mellitus with diabetic polyneuropathy: Secondary | ICD-10-CM | POA: Diagnosis not present

## 2016-10-16 DIAGNOSIS — E11311 Type 2 diabetes mellitus with unspecified diabetic retinopathy with macular edema: Secondary | ICD-10-CM | POA: Diagnosis not present

## 2016-10-16 DIAGNOSIS — E1122 Type 2 diabetes mellitus with diabetic chronic kidney disease: Secondary | ICD-10-CM | POA: Diagnosis not present

## 2016-10-19 DIAGNOSIS — E1142 Type 2 diabetes mellitus with diabetic polyneuropathy: Secondary | ICD-10-CM | POA: Diagnosis not present

## 2016-10-19 DIAGNOSIS — E1122 Type 2 diabetes mellitus with diabetic chronic kidney disease: Secondary | ICD-10-CM | POA: Diagnosis not present

## 2016-10-19 DIAGNOSIS — Z89422 Acquired absence of other left toe(s): Secondary | ICD-10-CM | POA: Diagnosis not present

## 2016-10-19 DIAGNOSIS — Z794 Long term (current) use of insulin: Secondary | ICD-10-CM | POA: Diagnosis not present

## 2016-10-19 DIAGNOSIS — Z4800 Encounter for change or removal of nonsurgical wound dressing: Secondary | ICD-10-CM | POA: Diagnosis not present

## 2016-10-19 DIAGNOSIS — L97522 Non-pressure chronic ulcer of other part of left foot with fat layer exposed: Secondary | ICD-10-CM | POA: Diagnosis not present

## 2016-10-19 DIAGNOSIS — E11311 Type 2 diabetes mellitus with unspecified diabetic retinopathy with macular edema: Secondary | ICD-10-CM | POA: Diagnosis not present

## 2016-10-19 DIAGNOSIS — Z7901 Long term (current) use of anticoagulants: Secondary | ICD-10-CM | POA: Diagnosis not present

## 2016-10-19 DIAGNOSIS — N183 Chronic kidney disease, stage 3 (moderate): Secondary | ICD-10-CM | POA: Diagnosis not present

## 2016-10-19 DIAGNOSIS — E11621 Type 2 diabetes mellitus with foot ulcer: Secondary | ICD-10-CM | POA: Diagnosis not present

## 2016-10-19 DIAGNOSIS — I129 Hypertensive chronic kidney disease with stage 1 through stage 4 chronic kidney disease, or unspecified chronic kidney disease: Secondary | ICD-10-CM | POA: Diagnosis not present

## 2016-10-20 ENCOUNTER — Encounter: Payer: Self-pay | Admitting: Sports Medicine

## 2016-10-20 ENCOUNTER — Telehealth: Payer: Self-pay | Admitting: Internal Medicine

## 2016-10-20 ENCOUNTER — Ambulatory Visit (INDEPENDENT_AMBULATORY_CARE_PROVIDER_SITE_OTHER): Payer: Medicare Other | Admitting: Sports Medicine

## 2016-10-20 VITALS — BP 157/87 | HR 67 | Resp 16

## 2016-10-20 DIAGNOSIS — IMO0002 Reserved for concepts with insufficient information to code with codable children: Secondary | ICD-10-CM

## 2016-10-20 DIAGNOSIS — E11621 Type 2 diabetes mellitus with foot ulcer: Secondary | ICD-10-CM

## 2016-10-20 DIAGNOSIS — E1140 Type 2 diabetes mellitus with diabetic neuropathy, unspecified: Secondary | ICD-10-CM

## 2016-10-20 DIAGNOSIS — L97522 Non-pressure chronic ulcer of other part of left foot with fat layer exposed: Secondary | ICD-10-CM

## 2016-10-20 DIAGNOSIS — Z89432 Acquired absence of left foot: Secondary | ICD-10-CM

## 2016-10-20 DIAGNOSIS — Z9889 Other specified postprocedural states: Secondary | ICD-10-CM

## 2016-10-20 NOTE — Telephone Encounter (Signed)
[facility] and spoke with [place].  Pt needing to resch appt from April to Nov 20, 2016 @ 2pm due to appointment time conflict with his ENDO/DR.

## 2016-10-20 NOTE — Progress Notes (Signed)
Subjective: [name] is a 66 y.o. male patient seen today in office for POV #15 (DOS 06-15-16), S/P Left wound debridement and placement of stravix allograft. Patient had grafix #3 placed last visit, denies pain at surgical site, denies calf pain, denies headache, chest pain, shortness of breath, nausea, vomiting, fever, or chills. Patient states that he is doing well with no issues. Has home nursing every other day. No other issues noted.   Patient Active Problem List   Diagnosis Date Noted  . Chronic pain syndrome 09/18/2015  . Preventative health care 03/12/2015  . Family history of coronary artery disease in father 12/14/2014  . Acute osteomyelitis of toe of left foot ([place])   . Foot ulcer, left ([place]) 12/13/2014  . Paroxysmal atrial fibrillation ([place]) 12/13/2014  . Elevated alkaline phosphatase level 06/11/2014  . Constipation 05/31/2014  . Vitamin D deficiency 11/17/2013  . Anemia 02/10/2012  . Hypertension 11/18/2011  . Wheezing 06/08/2011  . Obstructive sleep apnea 12/12/2009  . GERD 06/27/2008  . Diabetes type 2, uncontrolled ([place]) 12/28/2007  . DIABETIC MACULAR EDEMA 10/07/2007  . Dyslipidemia 08/15/2007  . BACKGROUND DIABETIC RETINOPATHY 08/15/2007  . ERECTILE DYSFUNCTION 05/31/2007  . HEARING LOSS, SENSORINEURAL, BILATERAL 01/03/2007  . Diabetic peripheral neuropathy associated with type 2 diabetes mellitus ([place]) 12/16/2006  . Chronic kidney disease, stage III (moderate) 12/01/2006  . Morbid obesity ([place]) 08/23/2006  . STATUS, OTHER TOE(S) AMPUTATION 05/21/2006    Current Outpatient Prescriptions on File Prior to Visit  Medication Sig Dispense Refill  . ACCU-CHEK AVIVA PLUS test strip USE TO CHECK BLOOD FOUR TIMES DAILY DBEFORE MEALS AND AT BEDTIME 150 each 3  . ACCU-CHEK FASTCLIX LANCETS MISC Check blood sugar 4 times a day before meals and bedtime dx code 250.02 insulin requiring 102 each 6  . atorvastatin (LIPITOR) 20 MG tablet TAKE 1 TABLET(20  MG) BY MOUTH DAILY 90 tablet 3  . BESIVANCE 0.6 % SUSP Place 1 drop into both eyes 4 (four) times daily.   12  . Blood Glucose Monitoring Suppl (ACCU-CHEK AVIVA PLUS) W/DEVICE KIT 1 each by Does not apply route 4 (four) times daily -  with meals and at bedtime. 1 kit 0  . Cholecalciferol (VITAMIN D3) 2000 UNITS capsule Take 1 capsule (2,000 Units total) by mouth daily. 90 capsule 0  . diclofenac sodium (VOLTAREN) 1 % GEL Apply 4 g topically 4 (four) times daily. 100 g 2  . docusate sodium (COLACE) 100 MG capsule Take 1 capsule (100 mg total) by mouth daily as needed for mild constipation. 14 capsule 0  . ELIQUIS 5 MG TABS tablet TAKE 1 TABLET(5 MG) BY MOUTH TWICE DAILY 180 tablet 3  . enalapril (VASOTEC) 20 MG tablet TAKE 2 TABLETS(40 MG) BY MOUTH DAILY 180 tablet 1  . furosemide (LASIX) 40 MG tablet TAKE 1 TABLET(40 MG) BY MOUTH TWICE DAILY 60 tablet 3  . gabapentin (NEURONTIN) 300 MG capsule Take 1 capsule (300 mg total) by mouth at bedtime. 30 capsule 3  . HUMALOG KWIKPEN 100 UNIT/ML KiwkPen SEE NOTES 60 mL 0  . HUMULIN R U-500 KWIKPEN 500 UNIT/ML kwikpen INJECT 20 UNITS AT BREAKFAST, 60 UNITS AT LUNCH, AND 70 UNITS AT DINNER 18 mL 0  . HUMULIN R U-500 KWIKPEN 500 UNIT/ML kwikpen INJECT 15 UNITS SUBCUTANEOUSLY AT BREAKFAST, 40 UNITS AT LUNCH AND 50 UNITS AT DINNER 6 mL 0  . Insulin Glargine (TOUJEO SOLOSTAR) 300 UNIT/ML SOPN Inject 60-90 Units into the  skin 2 (two) times daily. (Patient taking differently: Inject 70-80 Units into the skin 2 (two) times daily. ) 45 pen 2  . Insulin Pen Needle 31G X 5 MM MISC Use for insulin injection 5 times a day. 150 each 5  . LUMIGAN 0.01 % SOLN Place 1 drop into both eyes at bedtime.     . metoprolol succinate (TOPROL-XL) 25 MG 24 hr tablet TAKE 1 TABLET(25 MG) BY MOUTH DAILY 90 tablet 2  . omeprazole (PRILOSEC) 20 MG capsule TAKE 1 CAPSULE(20 MG) BY MOUTH DAILY 90 capsule 1  . PROAIR HFA 108 (90 Base) MCG/ACT inhaler INHALE 2 PUFFS INTO THE LUNGS BY MOUTH  EVERY 6 HOURS AS NEEDED FOR WHEEZING OR SHORTNESS OF BREATH 8.5 g 6  . promethazine (PHENERGAN) 25 MG tablet Take 1 tablet (25 mg total) by mouth every 8 (eight) hours as needed for nausea or vomiting. 30 tablet 2  . silver sulfADIAZINE (SILVADENE) 1 % cream Apply 1 application topically daily. 50 g 1  . SIMBRINZA 1-0.2 % SUSP Place 1 drop into both eyes 2 (two) times daily. Take as instructed by Dr. Katy Fitch.    Nelva Nay SOLOSTAR 300 UNIT/ML SOPN INJECT 74 UNITS INTO SKIN TWICE DAILY 9 pen 0  . VICTOZA 18 MG/3ML SOPN ADMINISTER 1.8 MG UNDER THE SKIN AT THE SAME TIME EVERY DAY 9 mL 0  . HYDROcodone-acetaminophen (NORCO) 7.5-325 MG tablet Take 1 tablet by mouth every 6 (six) hours as needed for moderate pain. 130 tablet 0   No current facility-administered medications on file prior to visit.     Allergies  Allergen Reactions  . Vancomycin     REACTION: ARF    Objective: There were no vitals filed for this visit.  General: No acute distress, AAOx3  Left foot: Plantar forefoot, wound graft fully incorporated with granular wound bed measuring 3x2x0.2cm (smaller than previous) with mild periwound maceration, swelling to left foot, no erythema, no warmth, no active drainage however on inner guaze there is drainage, decreased since last week with no acute signs of infection noted, Capillary fill time <3 seconds in all digits remaining, amputation status of 2nd and 3rd toes. No pain with calf compression.   Assessment and Plan:  Problem List Items Addressed This Visit    None    Visit Diagnoses    Diabetic ulcer of left foot associated with type 2 diabetes mellitus, with fat layer exposed, unspecified part of foot ([place])    -  Primary   Foot amputation status, left ([place])       Type 2 diabetes mellitus with diabetic neuropathy, unspecified long term insulin use status ([place])       S/P foot surgery, left           -Patient seen and evaluated -Cleansed wound debirded wound margins excisionlly to  healthy bleeding margins and applied Grafix core 3x4cm (graft #4 to site) in total to wound bed, lot no [id number], unit no [id number], exp Jan 26,2021, part no [id number] without waste, secured with adaptic and steristrips all covered with dry dressing -Advised patient to make sure to keep dressings clean, dry, and intact allowing nursing to change outer gauze dressings only; do not touch steristrips or adaptic -Advised patient to continue with forefoot offloading post-op shoe on left foot with use of cane -Advised patient to limit activity to necessity  -Advised patient to ice and elevate as necessary  -Will plan for re-application of Osiris Grafix to wound bed at next visit. In  the meantime, patient to call office if any issues or problems arise.   Landis Martins, DPM

## 2016-10-21 DIAGNOSIS — Z7901 Long term (current) use of anticoagulants: Secondary | ICD-10-CM | POA: Diagnosis not present

## 2016-10-21 DIAGNOSIS — L97522 Non-pressure chronic ulcer of other part of left foot with fat layer exposed: Secondary | ICD-10-CM | POA: Diagnosis not present

## 2016-10-21 DIAGNOSIS — E1122 Type 2 diabetes mellitus with diabetic chronic kidney disease: Secondary | ICD-10-CM | POA: Diagnosis not present

## 2016-10-21 DIAGNOSIS — Z794 Long term (current) use of insulin: Secondary | ICD-10-CM | POA: Diagnosis not present

## 2016-10-21 DIAGNOSIS — E11311 Type 2 diabetes mellitus with unspecified diabetic retinopathy with macular edema: Secondary | ICD-10-CM | POA: Diagnosis not present

## 2016-10-21 DIAGNOSIS — N183 Chronic kidney disease, stage 3 (moderate): Secondary | ICD-10-CM | POA: Diagnosis not present

## 2016-10-21 DIAGNOSIS — E11621 Type 2 diabetes mellitus with foot ulcer: Secondary | ICD-10-CM | POA: Diagnosis not present

## 2016-10-21 DIAGNOSIS — Z89422 Acquired absence of other left toe(s): Secondary | ICD-10-CM | POA: Diagnosis not present

## 2016-10-21 DIAGNOSIS — E1142 Type 2 diabetes mellitus with diabetic polyneuropathy: Secondary | ICD-10-CM | POA: Diagnosis not present

## 2016-10-21 DIAGNOSIS — Z4800 Encounter for change or removal of nonsurgical wound dressing: Secondary | ICD-10-CM | POA: Diagnosis not present

## 2016-10-21 DIAGNOSIS — I129 Hypertensive chronic kidney disease with stage 1 through stage 4 chronic kidney disease, or unspecified chronic kidney disease: Secondary | ICD-10-CM | POA: Diagnosis not present

## 2016-10-22 ENCOUNTER — Other Ambulatory Visit (INDEPENDENT_AMBULATORY_CARE_PROVIDER_SITE_OTHER): Payer: Medicare Other

## 2016-10-22 ENCOUNTER — Other Ambulatory Visit: Payer: Self-pay | Admitting: Endocrinology

## 2016-10-22 DIAGNOSIS — Z794 Long term (current) use of insulin: Principal | ICD-10-CM

## 2016-10-22 DIAGNOSIS — E1165 Type 2 diabetes mellitus with hyperglycemia: Secondary | ICD-10-CM

## 2016-10-22 LAB — BASIC METABOLIC PANEL
BUN: 15 mg/dL (ref 6–23)
CO2: 28 mEq/L (ref 19–32)
Calcium: 9.1 mg/dL (ref 8.4–10.5)
Chloride: 104 mEq/L (ref 96–112)
Creatinine, Ser: 1.55 mg/dL — ABNORMAL HIGH (ref 0.40–1.50)
GFR: 57.87 mL/min — AB (ref >60.00)
GLUCOSE: 152 mg/dL — AB (ref 70–99)
POTASSIUM: 4 meq/L (ref 3.5–5.1)
SODIUM: 137 meq/L (ref 135–145)

## 2016-10-22 LAB — LIPID PANEL
CHOL/HDL RATIO: 4
Cholesterol: 110 mg/dL (ref 0–200)
HDL: 28.5 mg/dL — ABNORMAL LOW (ref >39.00)
LDL CALC: 57 mg/dL (ref 0–99)
NONHDL: 81.96
Triglycerides: 126 mg/dL (ref 0.0–149.0)
VLDL: 25.2 mg/dL (ref 0.0–40.0)

## 2016-10-22 LAB — HEMOGLOBIN A1C: HEMOGLOBIN A1C: 9.2 % — AB (ref 4.6–6.5)

## 2016-10-22 NOTE — Telephone Encounter (Signed)
Helen can you please close this enounter °

## 2016-10-23 DIAGNOSIS — Z961 Presence of intraocular lens: Secondary | ICD-10-CM | POA: Diagnosis not present

## 2016-10-23 DIAGNOSIS — H35353 Cystoid macular degeneration, bilateral: Secondary | ICD-10-CM | POA: Diagnosis not present

## 2016-10-23 DIAGNOSIS — L97522 Non-pressure chronic ulcer of other part of left foot with fat layer exposed: Secondary | ICD-10-CM | POA: Diagnosis not present

## 2016-10-23 DIAGNOSIS — Z4800 Encounter for change or removal of nonsurgical wound dressing: Secondary | ICD-10-CM | POA: Diagnosis not present

## 2016-10-23 DIAGNOSIS — N183 Chronic kidney disease, stage 3 (moderate): Secondary | ICD-10-CM | POA: Diagnosis not present

## 2016-10-23 DIAGNOSIS — E1142 Type 2 diabetes mellitus with diabetic polyneuropathy: Secondary | ICD-10-CM | POA: Diagnosis not present

## 2016-10-23 DIAGNOSIS — Z89422 Acquired absence of other left toe(s): Secondary | ICD-10-CM | POA: Diagnosis not present

## 2016-10-23 DIAGNOSIS — H40053 Ocular hypertension, bilateral: Secondary | ICD-10-CM | POA: Diagnosis not present

## 2016-10-23 DIAGNOSIS — E11311 Type 2 diabetes mellitus with unspecified diabetic retinopathy with macular edema: Secondary | ICD-10-CM | POA: Diagnosis not present

## 2016-10-23 DIAGNOSIS — Z794 Long term (current) use of insulin: Secondary | ICD-10-CM | POA: Diagnosis not present

## 2016-10-23 DIAGNOSIS — E1122 Type 2 diabetes mellitus with diabetic chronic kidney disease: Secondary | ICD-10-CM | POA: Diagnosis not present

## 2016-10-23 DIAGNOSIS — E11621 Type 2 diabetes mellitus with foot ulcer: Secondary | ICD-10-CM | POA: Diagnosis not present

## 2016-10-23 DIAGNOSIS — I129 Hypertensive chronic kidney disease with stage 1 through stage 4 chronic kidney disease, or unspecified chronic kidney disease: Secondary | ICD-10-CM | POA: Diagnosis not present

## 2016-10-23 DIAGNOSIS — Z7901 Long term (current) use of anticoagulants: Secondary | ICD-10-CM | POA: Diagnosis not present

## 2016-10-23 DIAGNOSIS — E113413 Type 2 diabetes mellitus with severe nonproliferative diabetic retinopathy with macular edema, bilateral: Secondary | ICD-10-CM | POA: Diagnosis not present

## 2016-10-26 ENCOUNTER — Ambulatory Visit: Payer: Medicare Other | Admitting: Endocrinology

## 2016-10-26 DIAGNOSIS — Z794 Long term (current) use of insulin: Secondary | ICD-10-CM | POA: Diagnosis not present

## 2016-10-26 DIAGNOSIS — I129 Hypertensive chronic kidney disease with stage 1 through stage 4 chronic kidney disease, or unspecified chronic kidney disease: Secondary | ICD-10-CM | POA: Diagnosis not present

## 2016-10-26 DIAGNOSIS — Z4800 Encounter for change or removal of nonsurgical wound dressing: Secondary | ICD-10-CM | POA: Diagnosis not present

## 2016-10-26 DIAGNOSIS — N183 Chronic kidney disease, stage 3 (moderate): Secondary | ICD-10-CM | POA: Diagnosis not present

## 2016-10-26 DIAGNOSIS — E11311 Type 2 diabetes mellitus with unspecified diabetic retinopathy with macular edema: Secondary | ICD-10-CM | POA: Diagnosis not present

## 2016-10-26 DIAGNOSIS — Z7901 Long term (current) use of anticoagulants: Secondary | ICD-10-CM | POA: Diagnosis not present

## 2016-10-26 DIAGNOSIS — E1142 Type 2 diabetes mellitus with diabetic polyneuropathy: Secondary | ICD-10-CM | POA: Diagnosis not present

## 2016-10-26 DIAGNOSIS — E11621 Type 2 diabetes mellitus with foot ulcer: Secondary | ICD-10-CM | POA: Diagnosis not present

## 2016-10-26 DIAGNOSIS — L97522 Non-pressure chronic ulcer of other part of left foot with fat layer exposed: Secondary | ICD-10-CM | POA: Diagnosis not present

## 2016-10-26 DIAGNOSIS — E1122 Type 2 diabetes mellitus with diabetic chronic kidney disease: Secondary | ICD-10-CM | POA: Diagnosis not present

## 2016-10-26 DIAGNOSIS — Z89422 Acquired absence of other left toe(s): Secondary | ICD-10-CM | POA: Diagnosis not present

## 2016-10-26 NOTE — Telephone Encounter (Signed)
Thank you :)

## 2016-10-27 ENCOUNTER — Ambulatory Visit (INDEPENDENT_AMBULATORY_CARE_PROVIDER_SITE_OTHER): Payer: Medicare Other | Admitting: Sports Medicine

## 2016-10-27 DIAGNOSIS — L97522 Non-pressure chronic ulcer of other part of left foot with fat layer exposed: Secondary | ICD-10-CM

## 2016-10-27 DIAGNOSIS — Z9889 Other specified postprocedural states: Secondary | ICD-10-CM

## 2016-10-27 DIAGNOSIS — Z89432 Acquired absence of left foot: Secondary | ICD-10-CM

## 2016-10-27 DIAGNOSIS — E11621 Type 2 diabetes mellitus with foot ulcer: Secondary | ICD-10-CM | POA: Diagnosis not present

## 2016-10-27 DIAGNOSIS — IMO0002 Reserved for concepts with insufficient information to code with codable children: Secondary | ICD-10-CM

## 2016-10-27 NOTE — Telephone Encounter (Signed)
-----   Message from Landis Martins, [place] sent at 10/27/2016 12:13 PM EDT ----- Regarding: Reorder Grafix Reorder for next visit Thanks Dr. Cannon Kettle

## 2016-10-27 NOTE — Progress Notes (Signed)
Subjective: [name] is a 66 y.o. male patient seen today in office for POV #16 (DOS 06-15-16), S/P Left wound debridement and placement of stravix allograft. Patient had grafix #4 placed last visit, denies pain at surgical site, denies calf pain, denies headache, chest pain, shortness of breath, nausea, vomiting, fever, or chills. Patient states that he is doing well with no issues. Has home nursing every other day and home nursing had to replace dressing because adaptic fell off. No other issues noted.   Patient Active Problem List   Diagnosis Date Noted  . Chronic pain syndrome 09/18/2015  . Preventative health care 03/12/2015  . Family history of coronary artery disease in father 12/14/2014  . Acute osteomyelitis of toe of left foot ([place])   . Foot ulcer, left ([place]) 12/13/2014  . Paroxysmal atrial fibrillation ([place]) 12/13/2014  . Elevated alkaline phosphatase level 06/11/2014  . Constipation 05/31/2014  . Vitamin D deficiency 11/17/2013  . Anemia 02/10/2012  . Hypertension 11/18/2011  . Wheezing 06/08/2011  . Obstructive sleep apnea 12/12/2009  . GERD 06/27/2008  . Diabetes type 2, uncontrolled ([place]) 12/28/2007  . DIABETIC MACULAR EDEMA 10/07/2007  . Dyslipidemia 08/15/2007  . BACKGROUND DIABETIC RETINOPATHY 08/15/2007  . ERECTILE DYSFUNCTION 05/31/2007  . HEARING LOSS, SENSORINEURAL, BILATERAL 01/03/2007  . Diabetic peripheral neuropathy associated with type 2 diabetes mellitus ([place]) 12/16/2006  . Chronic kidney disease, stage III (moderate) 12/01/2006  . Morbid obesity ([place]) 08/23/2006  . STATUS, OTHER TOE(S) AMPUTATION 05/21/2006    Current Outpatient Prescriptions on File Prior to Visit  Medication Sig Dispense Refill  . ACCU-CHEK AVIVA PLUS test strip USE TO CHECK BLOOD FOUR TIMES DAILY DBEFORE MEALS AND AT BEDTIME 150 each 3  . ACCU-CHEK FASTCLIX LANCETS MISC Check blood sugar 4 times a day before meals and bedtime dx code 250.02 insulin requiring 102 each 6  .  atorvastatin (LIPITOR) 20 MG tablet TAKE 1 TABLET(20 MG) BY MOUTH DAILY 90 tablet 3  . BESIVANCE 0.6 % SUSP Place 1 drop into both eyes 4 (four) times daily.   12  . Blood Glucose Monitoring Suppl (ACCU-CHEK AVIVA PLUS) W/DEVICE KIT 1 each by Does not apply route 4 (four) times daily -  with meals and at bedtime. 1 kit 0  . Cholecalciferol (VITAMIN D3) 2000 UNITS capsule Take 1 capsule (2,000 Units total) by mouth daily. 90 capsule 0  . diclofenac sodium (VOLTAREN) 1 % GEL Apply 4 g topically 4 (four) times daily. 100 g 2  . docusate sodium (COLACE) 100 MG capsule Take 1 capsule (100 mg total) by mouth daily as needed for mild constipation. 14 capsule 0  . ELIQUIS 5 MG TABS tablet TAKE 1 TABLET(5 MG) BY MOUTH TWICE DAILY 180 tablet 3  . enalapril (VASOTEC) 20 MG tablet TAKE 2 TABLETS(40 MG) BY MOUTH DAILY 180 tablet 1  . furosemide (LASIX) 40 MG tablet TAKE 1 TABLET(40 MG) BY MOUTH TWICE DAILY 60 tablet 3  . gabapentin (NEURONTIN) 300 MG capsule Take 1 capsule (300 mg total) by mouth at bedtime. 30 capsule 3  . HUMALOG KWIKPEN 100 UNIT/ML KiwkPen SEE NOTES 60 mL 0  . HUMULIN R U-500 KWIKPEN 500 UNIT/ML kwikpen INJECT 20 UNITS AT BREAKFAST, 60 UNITS AT LUNCH, AND 70 UNITS AT DINNER 18 mL 0  . HUMULIN R U-500 KWIKPEN 500 UNIT/ML kwikpen INJECT 15 UNITS SUBCUTANEOUSLY AT BREAKFAST, 40 UNITS AT LUNCH AND 50 UNITS AT DINNER 6 mL 0  . HYDROcodone-acetaminophen (NORCO) 7.5-325 MG tablet Take 1 tablet by  mouth every 6 (six) hours as needed for moderate pain. 130 tablet 0  . Insulin Glargine (TOUJEO SOLOSTAR) 300 UNIT/ML SOPN Inject 60-90 Units into the skin 2 (two) times daily. (Patient taking differently: Inject 70-80 Units into the skin 2 (two) times daily. ) 45 pen 2  . Insulin Pen Needle 31G X 5 MM MISC Use for insulin injection 5 times a day. 150 each 5  . LUMIGAN 0.01 % SOLN Place 1 drop into both eyes at bedtime.     . metoprolol succinate (TOPROL-XL) 25 MG 24 hr tablet TAKE 1 TABLET(25 MG) BY MOUTH  DAILY 90 tablet 2  . omeprazole (PRILOSEC) 20 MG capsule TAKE 1 CAPSULE(20 MG) BY MOUTH DAILY 90 capsule 1  . PROAIR HFA 108 (90 Base) MCG/ACT inhaler INHALE 2 PUFFS INTO THE LUNGS BY MOUTH EVERY 6 HOURS AS NEEDED FOR WHEEZING OR SHORTNESS OF BREATH 8.5 g 6  . promethazine (PHENERGAN) 25 MG tablet Take 1 tablet (25 mg total) by mouth every 8 (eight) hours as needed for nausea or vomiting. 30 tablet 2  . silver sulfADIAZINE (SILVADENE) 1 % cream Apply 1 application topically daily. 50 g 1  . SIMBRINZA 1-0.2 % SUSP Place 1 drop into both eyes 2 (two) times daily. Take as instructed by Dr. Katy Fitch.    Nelva Nay SOLOSTAR 300 UNIT/ML SOPN INJECT 74 UNITS INTO SKIN TWICE DAILY 9 pen 0  . VICTOZA 18 MG/3ML SOPN ADMINISTER 1.8 MG UNDER THE SKIN AT THE SAME TIME EVERY DAY 9 mL 0   No current facility-administered medications on file prior to visit.     Allergies  Allergen Reactions  . Vancomycin     REACTION: ARF    Objective: There were no vitals filed for this visit.  General: No acute distress, AAOx3  Left foot: Plantar forefoot, wound graft fully incorporated with granular wound bed measuring 3x1.5x0.2cm (smaller than previous) with mild periwound maceration, decreased swelling to left foot, no erythema, no warmth, no active drainage however on inner guaze there is drainage, decreased since last week with no acute signs of infection noted, Capillary fill time <3 seconds in all digits remaining, amputation status of 2nd and 3rd toes. No pain with calf compression.   Assessment and Plan:  Problem List Items Addressed This Visit    None    Visit Diagnoses    Diabetic ulcer of left foot associated with type 2 diabetes mellitus, with fat layer exposed, unspecified part of foot ([place])    -  Primary   Foot amputation status, left ([place])       S/P foot surgery, left           -Patient seen and evaluated -Cleansed wound debirded wound margins excisionlly to healthy bleeding margins and applied  Grafix core 3x4cm (graft #5 to site) in total to wound bed, lot no [id number], unit no [id number] exp Feb 15,2021, part no [id number] without waste, secured with adaptic and steristrips all covered with dry dressing -Advised patient to make sure to keep dressings clean, dry, and intact allowing nursing to change outer gauze dressings only; do not touch steristrips or adaptic -Advised patient to continue with forefoot offloading post-op shoe on left foot with use of cane -Advised patient to limit activity to necessity  -Advised patient to ice and elevate as necessary  -Will plan for re-application of Osiris Grafix to wound bed at next visit. In the meantime, patient to call office if any issues or problems arise.   Landis Martins,  DPM

## 2016-10-28 ENCOUNTER — Other Ambulatory Visit: Payer: Self-pay

## 2016-10-28 DIAGNOSIS — L97522 Non-pressure chronic ulcer of other part of left foot with fat layer exposed: Secondary | ICD-10-CM | POA: Diagnosis not present

## 2016-10-28 DIAGNOSIS — Z794 Long term (current) use of insulin: Secondary | ICD-10-CM | POA: Diagnosis not present

## 2016-10-28 DIAGNOSIS — N183 Chronic kidney disease, stage 3 (moderate): Secondary | ICD-10-CM | POA: Diagnosis not present

## 2016-10-28 DIAGNOSIS — E11311 Type 2 diabetes mellitus with unspecified diabetic retinopathy with macular edema: Secondary | ICD-10-CM | POA: Diagnosis not present

## 2016-10-28 DIAGNOSIS — Z7901 Long term (current) use of anticoagulants: Secondary | ICD-10-CM | POA: Diagnosis not present

## 2016-10-28 DIAGNOSIS — E1122 Type 2 diabetes mellitus with diabetic chronic kidney disease: Secondary | ICD-10-CM | POA: Diagnosis not present

## 2016-10-28 DIAGNOSIS — Z4800 Encounter for change or removal of nonsurgical wound dressing: Secondary | ICD-10-CM | POA: Diagnosis not present

## 2016-10-28 DIAGNOSIS — E1142 Type 2 diabetes mellitus with diabetic polyneuropathy: Secondary | ICD-10-CM | POA: Diagnosis not present

## 2016-10-28 DIAGNOSIS — I129 Hypertensive chronic kidney disease with stage 1 through stage 4 chronic kidney disease, or unspecified chronic kidney disease: Secondary | ICD-10-CM | POA: Diagnosis not present

## 2016-10-28 DIAGNOSIS — Z89422 Acquired absence of other left toe(s): Secondary | ICD-10-CM | POA: Diagnosis not present

## 2016-10-28 DIAGNOSIS — G894 Chronic pain syndrome: Secondary | ICD-10-CM

## 2016-10-28 DIAGNOSIS — E11621 Type 2 diabetes mellitus with foot ulcer: Secondary | ICD-10-CM | POA: Diagnosis not present

## 2016-10-28 MED ORDER — HYDROCODONE-ACETAMINOPHEN 7.5-325 MG PO TABS: 1.0000 | ORAL_TABLET | Freq: Four times a day (QID) | ORAL | 0 refills | Status: DC | PRN

## 2016-10-28 NOTE — Telephone Encounter (Signed)
Notified patient rx is ready for p/u. Per Dr. Dareen Piano this is probably the last script as patient is going to pain clinic next month.

## 2016-10-28 NOTE — Telephone Encounter (Signed)
HYDROcodone-acetaminophen (NORCO) 7.5-325 MG tablet, refill request.  

## 2016-10-30 DIAGNOSIS — I129 Hypertensive chronic kidney disease with stage 1 through stage 4 chronic kidney disease, or unspecified chronic kidney disease: Secondary | ICD-10-CM | POA: Diagnosis not present

## 2016-10-30 DIAGNOSIS — L97522 Non-pressure chronic ulcer of other part of left foot with fat layer exposed: Secondary | ICD-10-CM | POA: Diagnosis not present

## 2016-10-30 DIAGNOSIS — Z4800 Encounter for change or removal of nonsurgical wound dressing: Secondary | ICD-10-CM | POA: Diagnosis not present

## 2016-10-30 DIAGNOSIS — E11311 Type 2 diabetes mellitus with unspecified diabetic retinopathy with macular edema: Secondary | ICD-10-CM | POA: Diagnosis not present

## 2016-10-30 DIAGNOSIS — Z89422 Acquired absence of other left toe(s): Secondary | ICD-10-CM | POA: Diagnosis not present

## 2016-10-30 DIAGNOSIS — E1142 Type 2 diabetes mellitus with diabetic polyneuropathy: Secondary | ICD-10-CM | POA: Diagnosis not present

## 2016-10-30 DIAGNOSIS — N183 Chronic kidney disease, stage 3 (moderate): Secondary | ICD-10-CM | POA: Diagnosis not present

## 2016-10-30 DIAGNOSIS — Z7901 Long term (current) use of anticoagulants: Secondary | ICD-10-CM | POA: Diagnosis not present

## 2016-10-30 DIAGNOSIS — E1122 Type 2 diabetes mellitus with diabetic chronic kidney disease: Secondary | ICD-10-CM | POA: Diagnosis not present

## 2016-10-30 DIAGNOSIS — Z794 Long term (current) use of insulin: Secondary | ICD-10-CM | POA: Diagnosis not present

## 2016-10-30 DIAGNOSIS — E11621 Type 2 diabetes mellitus with foot ulcer: Secondary | ICD-10-CM | POA: Diagnosis not present

## 2016-11-02 ENCOUNTER — Other Ambulatory Visit: Payer: Self-pay

## 2016-11-02 ENCOUNTER — Other Ambulatory Visit: Payer: Self-pay | Admitting: Internal Medicine

## 2016-11-02 ENCOUNTER — Encounter: Payer: Self-pay | Admitting: Endocrinology

## 2016-11-02 ENCOUNTER — Other Ambulatory Visit: Payer: Self-pay | Admitting: Endocrinology

## 2016-11-02 ENCOUNTER — Ambulatory Visit (INDEPENDENT_AMBULATORY_CARE_PROVIDER_SITE_OTHER): Payer: Medicare Other | Admitting: Endocrinology

## 2016-11-02 VITALS — BP 138/80 | HR 90 | Ht 78.0 in | Wt 387.0 lb

## 2016-11-02 DIAGNOSIS — E1122 Type 2 diabetes mellitus with diabetic chronic kidney disease: Secondary | ICD-10-CM | POA: Diagnosis not present

## 2016-11-02 DIAGNOSIS — I10 Essential (primary) hypertension: Secondary | ICD-10-CM | POA: Diagnosis not present

## 2016-11-02 DIAGNOSIS — E1165 Type 2 diabetes mellitus with hyperglycemia: Secondary | ICD-10-CM

## 2016-11-02 DIAGNOSIS — L97522 Non-pressure chronic ulcer of other part of left foot with fat layer exposed: Secondary | ICD-10-CM | POA: Diagnosis not present

## 2016-11-02 DIAGNOSIS — Z7901 Long term (current) use of anticoagulants: Secondary | ICD-10-CM | POA: Diagnosis not present

## 2016-11-02 DIAGNOSIS — N183 Chronic kidney disease, stage 3 (moderate): Secondary | ICD-10-CM | POA: Diagnosis not present

## 2016-11-02 DIAGNOSIS — E1142 Type 2 diabetes mellitus with diabetic polyneuropathy: Secondary | ICD-10-CM | POA: Diagnosis not present

## 2016-11-02 DIAGNOSIS — Z794 Long term (current) use of insulin: Secondary | ICD-10-CM | POA: Diagnosis not present

## 2016-11-02 DIAGNOSIS — Z4800 Encounter for change or removal of nonsurgical wound dressing: Secondary | ICD-10-CM | POA: Diagnosis not present

## 2016-11-02 DIAGNOSIS — Z89422 Acquired absence of other left toe(s): Secondary | ICD-10-CM | POA: Diagnosis not present

## 2016-11-02 DIAGNOSIS — I129 Hypertensive chronic kidney disease with stage 1 through stage 4 chronic kidney disease, or unspecified chronic kidney disease: Secondary | ICD-10-CM | POA: Diagnosis not present

## 2016-11-02 DIAGNOSIS — E11621 Type 2 diabetes mellitus with foot ulcer: Secondary | ICD-10-CM | POA: Diagnosis not present

## 2016-11-02 DIAGNOSIS — E11311 Type 2 diabetes mellitus with unspecified diabetic retinopathy with macular edema: Secondary | ICD-10-CM | POA: Diagnosis not present

## 2016-11-02 MED ORDER — INSULIN REGULAR HUMAN (CONC) 500 UNIT/ML ~~LOC~~ SOPN: PEN_INJECTOR | SUBCUTANEOUS | 2 refills | Status: DC

## 2016-11-02 MED ORDER — GLUCOSE BLOOD VI STRP: ORAL_STRIP | 3 refills | Status: DC

## 2016-11-02 NOTE — Patient Instructions (Addendum)
Take 35 units in am if having grits   Toujeo 76 units in pm   More sugars at bedtime

## 2016-11-02 NOTE — Progress Notes (Signed)
Patient ID: [name], male   DOB: Nov 16, 1950, 66 y.o.   MRN: [id number]           Reason for Appointment: Follow-up for Type 2 Diabetes  Referring physician: Dareen Piano  History of Present Illness:          Date of diagnosis of type 2 diabetes mellitus: 2007       Background history:   He was diagnosed to have diabetes when he had an ulcer on his left third toe which led to amputation.  His glucose was apparently about 400 and he was started on insulin at that time He does not know if he has taken any oral hypoglycemic drugs in the past His A1c in 2016 has been consistently over 8%  Recent history:   INSULIN regimen is:  TOUJEO  70 in the morning and 70 in the evening HUMULIN U-500 insulin: 20 units in the morning, 60/70 at lunch and 70 at supper  Non-insulin hypoglycemic drugs the patient is taking are: Victoza 1.8 mg daily       On his initial consultation was started on Victoza and subsequently started on U-500 insulin in 2/17 along with Toujeo  Blood sugars have been difficult to control and A1c was last still high at 9.2  He was recommended starting the V-go pump in January but he thinks this was too expensive and did not start it  Current blood sugar patterns and problems identified:  FASTING blood sugars are mostly high but occasionally has near normal readings  Because of occasional readings in the 60s in the morning on his last visit he was Toujeo was reduced by 10 units in the evening  He is however not remember and to check his sugars after supper and not clear if his readings are going up with his evening meal  He has variable breakfast in the morning and blood sugars by lunchtime are also quite variable, does not adjust his dose based on what he is eating.  Sometimes he relieved either a banana or crackers and sometimes will have sausage and grits  He is trying to take this U-500 insulin about an hour before eating  He has the most variability of his  blood sugars in the afternoon hours although recently blood sugars are fairly good and not consistently high He has seen the dietitian in the past  Hypoglycemia: Minimal, only once or twice has had a reading below 65 in the late morning or early afternoon  Glucose monitoring:  done 3-4  times a day         Glucometer:  Accu-Chek     Blood Glucose readings by download:  Mean values apply above for all meters except median for One Touch  PRE-MEAL Fasting Lunch Dinner Bedtime Overall  Glucose range: 107-244 60-296  95-3 52     Mean/median: 177     165+/-56    POST-MEAL PC Breakfast PC Lunch PC Dinner  Glucose range:   ?  180, 286   Mean/median:        Self-care:  Typical meal intake: Breakfast is sometimes fruit like a banana, Sometimes eating sausage And grits Usually trying to avoid high-fat foods or fried food  Lunch 12 noon Dinner at 5-6 pm He drinks grapefruit or V-8 juice and diet drinks                Dietician consultation: 12/16  Exercise: not walking,  has nonhealing foot ulcer on the left  Weight history: Previous range 260-410  Wt Readings from Last 3 Encounters:  11/02/16 (!) 387 lb (175.5 kg)  09/15/16 (!) 379 lb 12.8 oz (172.3 kg)  07/28/16 (!) 375 lb 12.8 oz (170.5 kg)    Glycemic control:   Lab Results  Component Value Date   HGBA1C 9.2 (H) 10/22/2016   HGBA1C 9.7 (H) 07/16/2016   HGBA1C 9.1 (H) 04/13/2016   Lab Results  Component Value Date   MICROALBUR 14.9 (H) 04/13/2016   LDLCALC 57 10/22/2016   CREATININE 1.55 (H) 10/22/2016   No visits with results within 1 Week(s) from this visit.  Latest known visit with results is:  Lab on 10/22/2016  Component Date Value Ref Range Status  . Hgb A1c MFr Bld 10/22/2016 9.2* 4.6 - 6.5 % Final  . Sodium 10/22/2016 137  135 - 145 mEq/L Final  . Potassium 10/22/2016 4.0  3.5 - 5.1 mEq/L Final  . Chloride 10/22/2016 104  96 - 112 mEq/L Final  . CO2 10/22/2016 28  19 - 32 mEq/L Final  . Glucose,  Bld 10/22/2016 152* 70 - 99 mg/dL Final  . BUN 10/22/2016 15  6 - 23 mg/dL Final  . Creatinine, Ser 10/22/2016 1.55* 0.40 - 1.50 mg/dL Final  . Calcium 10/22/2016 9.1  8.4 - 10.5 mg/dL Final  . GFR 10/22/2016 57.87* >60.00 mL/min Final  . Cholesterol 10/22/2016 110  0 - 200 mg/dL Final  . Triglycerides 10/22/2016 126.0  0.0 - 149.0 mg/dL Final  . HDL 10/22/2016 28.50* >39.00 mg/dL Final  . VLDL 10/22/2016 25.2  0.0 - 40.0 mg/dL Final  . LDL Cholesterol 10/22/2016 57  0 - 99 mg/dL Final  . Total CHOL/HDL Ratio 10/22/2016 4   Final  . NonHDL 10/22/2016 81.96   Final       Allergies as of 11/02/2016      Reactions   Vancomycin    REACTION: ARF      Medication List       Accurate as of 11/02/16  3:31 PM. Always use your most recent med list.          ACCU-CHEK AVIVA PLUS test strip Generic drug:  glucose blood USE TO CHECK BLOOD FOUR TIMES DAILY DBEFORE MEALS AND AT BEDTIME   ACCU-CHEK AVIVA PLUS w/Device Kit 1 each by Does not apply route 4 (four) times daily -  with meals and at bedtime.   ACCU-CHEK FASTCLIX LANCETS Misc Check blood sugar 4 times a day before meals and bedtime dx code 250.02 insulin requiring   atorvastatin 20 MG tablet Commonly known as:  LIPITOR TAKE 1 TABLET(20 MG) BY MOUTH DAILY   BESIVANCE 0.6 % Susp Generic drug:  Besifloxacin HCl Place 1 drop into both eyes 4 (four) times daily.   diclofenac sodium 1 % Gel Commonly known as:  VOLTAREN Apply 4 g topically 4 (four) times daily.   docusate sodium 100 MG capsule Commonly known as:  COLACE Take 1 capsule (100 mg total) by mouth daily as needed for mild constipation.   ELIQUIS 5 MG Tabs tablet Generic drug:  apixaban TAKE 1 TABLET(5 MG) BY MOUTH TWICE DAILY   enalapril 20 MG tablet Commonly known as:  VASOTEC TAKE 2 TABLETS(40 MG) BY MOUTH DAILY   furosemide 40 MG tablet Commonly known as:  LASIX TAKE 1 TABLET(40 MG) BY MOUTH TWICE DAILY   gabapentin 300 MG capsule Commonly known as:   NEURONTIN Take 1  capsule (300 mg total) by mouth at bedtime.   HYDROcodone-acetaminophen 7.5-325 MG tablet Commonly known as:  NORCO Take 1 tablet by mouth every 6 (six) hours as needed for moderate pain.   Insulin Glargine 300 UNIT/ML Sopn Commonly known as:  TOUJEO SOLOSTAR Inject 60-90 Units into the skin 2 (two) times daily.   TOUJEO SOLOSTAR 300 UNIT/ML Sopn Generic drug:  Insulin Glargine INJECT 74 UNITS INTO SKIN TWICE DAILY   Insulin Pen Needle 31G X 5 MM Misc Use for insulin injection 5 times a day.   insulin regular human CONCENTRATED 500 UNIT/ML kwikpen Commonly known as:  HUMULIN R U-500 KWIKPEN INJECT 20 UNITS AT BREAKFAST, 60 UNITS AT LUNCH, AND 70 UNITS AT DINNER   LUMIGAN 0.01 % Soln Generic drug:  bimatoprost Place 1 drop into both eyes at bedtime.   metoprolol succinate 25 MG 24 hr tablet Commonly known as:  TOPROL-XL TAKE 1 TABLET(25 MG) BY MOUTH DAILY   omeprazole 20 MG capsule Commonly known as:  PRILOSEC TAKE 1 CAPSULE(20 MG) BY MOUTH DAILY   PROAIR HFA 108 (90 Base) MCG/ACT inhaler Generic drug:  albuterol INHALE 2 PUFFS INTO THE LUNGS BY MOUTH EVERY 6 HOURS AS NEEDED FOR WHEEZING OR SHORTNESS OF BREATH   promethazine 25 MG tablet Commonly known as:  PHENERGAN Take 1 tablet (25 mg total) by mouth every 8 (eight) hours as needed for nausea or vomiting.   silver sulfADIAZINE 1 % cream Commonly known as:  SILVADENE Apply 1 application topically daily.   SIMBRINZA 1-0.2 % Susp Generic drug:  Brinzolamide-Brimonidine Place 1 drop into both eyes 2 (two) times daily. Take as instructed by Dr. Katy Fitch.   VICTOZA 18 MG/3ML Sopn Generic drug:  liraglutide ADMINISTER 1.8 MG UNDER THE SKIN AT THE SAME TIME EVERY DAY   Vitamin D3 2000 units capsule Take 1 capsule (2,000 Units total) by mouth daily.       Allergies:  Allergies  Allergen Reactions  . Vancomycin     REACTION: ARF    Past Medical History:  Diagnosis Date  . Arthritis     "elbows & knees" (12/13/2014)  . Asthma   . CKD (chronic kidney disease), stage III   . DIABETIC FOOT ULCER 06/20/2009  . Edema, macular, due to secondary diabetes ([place])   . Erectile dysfunction   . GERD (gastroesophageal reflux disease)   . HEARING LOSS, SENSORINEURAL, BILATERAL 01/03/2007   Seen by ENT Dr. Orpah Greek D. Redmond Baseman 01/03/07  . Hemorrhoids   . History of echocardiogram    a. 04/2008 Echo: EF 50-55%, abnl LV relaxation, mildly dil LA.  Marland Kitchen Hyperlipidemia   . Hypertension   . Morbid obesity ([place])   . Neuropathy, lower extremity   . OSA (obstructive sleep apnea)    uses CPAP nightly  . OSA on CPAP    Nocturnal polysomnogram on 01/21/2010 showed severe obstructive sleep apnea/hypopnea syndrome, AHI 74.1 per hour with non positional events, moderately loud snoring, and oxygen desaturation to a nadir of 78% on room air.  CPAP was successfully titrated to 17 CWP, AHI 1.1 per hour using a large ResMed Mirage Quattro full-face mask with heated humidifier. Bruxism was noted.   . Osteomyelitis of ankle and foot ([place])   . Retinopathy   . Type II diabetes mellitus (HCC)    w/complication NOS, type II    Past Surgical History:  Procedure Laterality Date  . AMPUTATION Left 12/15/2014   Procedure: LEFT SECOND TOE AMPUTATION ;  Surgeon: Dorna Leitz, MD;  Location: [place];  Service: Orthopedics;  Laterality: Left;  . 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 Kathalene Frames. Mayer Camel.  . WOUND DEBRIDEMENT Left 06/15/2016   Procedure: DEBRIDEMENT WOUND LEFT FOOT WITH GRAFT APPLICATION;  Surgeon: Landis Martins, DPM;  Location: [place];  Service: Podiatry;  Laterality: Left;    Family History  Problem Relation Age of Onset  . Diabetes Mother     also 2 siblings  . Heart attack Father 89  . Throat cancer Brother     Social History:  reports that he quit smoking about 6 years ago. His smoking use included  Cigarettes. He has a 15.00 pack-year smoking history. He has never used smokeless tobacco. He reports that he drinks about 6.6 oz of alcohol per week . He reports that he does not use drugs.    Review of Systems    Lipid management: taking Lipitor 20 mg, followed by PCP    Lab Results  Component Value Date   CHOL 110 10/22/2016   HDL 28.50 (L) 10/22/2016   LDLCALC 57 10/22/2016   TRIG 126.0 10/22/2016   CHOLHDL 4 10/22/2016            Hypertension: Has had hypertension for a few years treated with enalapril,  taking 40 mg  Also on Lasix and 25 mg metoprolol  Last foot exam done by podiatrist Has been seen regularly by podiatrist, is getting skin grafting done for his ulcer    Physical Examination:  BP 138/80   Pulse 90   Ht _0  (1.981 m)   Wt (!) 387 lb (175.5 kg)   BMI 44.72 kg/m       ASSESSMENT:  Diabetes type 2, uncontrolled with morbid obesity  See history of present illness for detailed discussion of current diabetes management, blood sugar patterns and problems identified  He is  insulin resistant and taking large amounts of insulin   A1c is still over 9%  His blood sugars are fluctuating at various times Also not clear if his sugars are high after supper as he does not check postprandial readings, currently says he is getting only enough strips to check sugar 3 times a day Fasting readings are relatively higher Some of his variability is related to inconsistent diet He is recently gaining weight also  HYPERTENSION:  Blood pressure is controlled  Lipids: Adequately controlled with Lipitor   PLAN:     Will check with the [employer] company again to see if he can get assistance for the supplies  He does need to adjust his morning doses of insulin based on what he is eating and take at least 35 units in the morning if having a full breakfast  Otherwise will only change his dose of Toujeo in the evening  If he is having high readings at bedtime he may  consider increasing  suppertime Humulin R also  Discussed the freestyle [place] system and will consider this on the next visit, meanwhile start checking readings 4 times a day  Continue Victoza unchanged  He may not be a candidate for drug like Invokana because of mild chronic renal impairment   Patient Instructions  Take 35 units in am if having grits   Toujeo 76 units in pm   More sugars at bedtime        Counseling time on subjects discussed above is over 50% of today's 25 minute visit  Atwood Adcock 11/02/2016, 3:31 PM   Note: This office note was prepared with Dragon voice recognition system technology. Any transcriptional errors that result from this process are unintentional.

## 2016-11-03 ENCOUNTER — Encounter: Payer: Self-pay | Admitting: Sports Medicine

## 2016-11-03 ENCOUNTER — Ambulatory Visit (INDEPENDENT_AMBULATORY_CARE_PROVIDER_SITE_OTHER): Payer: Medicare Other | Admitting: Sports Medicine

## 2016-11-03 VITALS — BP 148/82 | HR 84 | Resp 16

## 2016-11-03 DIAGNOSIS — Z9889 Other specified postprocedural states: Secondary | ICD-10-CM

## 2016-11-03 DIAGNOSIS — Z89432 Acquired absence of left foot: Secondary | ICD-10-CM

## 2016-11-03 DIAGNOSIS — L97522 Non-pressure chronic ulcer of other part of left foot with fat layer exposed: Secondary | ICD-10-CM | POA: Diagnosis not present

## 2016-11-03 DIAGNOSIS — E11621 Type 2 diabetes mellitus with foot ulcer: Secondary | ICD-10-CM

## 2016-11-03 DIAGNOSIS — IMO0002 Reserved for concepts with insufficient information to code with codable children: Secondary | ICD-10-CM

## 2016-11-03 MED ORDER — PROMETHAZINE HCL 25 MG PO TABS: 25.0000 mg | ORAL_TABLET | Freq: Three times a day (TID) | ORAL | 2 refills | Status: DC | PRN

## 2016-11-03 NOTE — Telephone Encounter (Signed)
-----   Message from Landis Martins, [place] sent at 11/03/2016 11:36 AM EDT ----- Regarding: Grafix and home nursing  Reorder grafix for next visit Also send note to home nurse measurement for today was 2.5x1.5x0.2cm today. Nurse to change outer dressings and may apply absorbant pad to catch any wound drainage. Today in office I used hydrofora blue pad. -Dr. Cannon Kettle

## 2016-11-03 NOTE — Progress Notes (Signed)
Subjective: [name] is a 66 y.o. male patient seen today in office for POV #17 (DOS 06-15-16), S/P Left wound debridement and placement of stravix allograft. Patient had grafix #5 placed last visit, denies pain at surgical site, denies calf pain, denies headache, chest pain, shortness of breath, nausea, vomiting, fever, or chills. Patient states that he is doing well with no issues however does have a little pain 4/10 and wants a refill on promethazine. Has home nursing every other day and home nursing. No other issues noted.   Patient Active Problem List   Diagnosis Date Noted  . Chronic pain syndrome 09/18/2015  . Preventative health care 03/12/2015  . Family history of coronary artery disease in father 12/14/2014  . Acute osteomyelitis of toe of left foot ([place])   . Foot ulcer, left ([place]) 12/13/2014  . Paroxysmal atrial fibrillation ([place]) 12/13/2014  . Elevated alkaline phosphatase level 06/11/2014  . Constipation 05/31/2014  . Vitamin D deficiency 11/17/2013  . Anemia 02/10/2012  . Hypertension 11/18/2011  . Wheezing 06/08/2011  . Obstructive sleep apnea 12/12/2009  . GERD 06/27/2008  . Diabetes type 2, uncontrolled ([place]) 12/28/2007  . DIABETIC MACULAR EDEMA 10/07/2007  . Dyslipidemia 08/15/2007  . BACKGROUND DIABETIC RETINOPATHY 08/15/2007  . ERECTILE DYSFUNCTION 05/31/2007  . HEARING LOSS, SENSORINEURAL, BILATERAL 01/03/2007  . Diabetic peripheral neuropathy associated with type 2 diabetes mellitus ([place]) 12/16/2006  . Chronic kidney disease, stage III (moderate) 12/01/2006  . Morbid obesity ([place]) 08/23/2006  . STATUS, OTHER TOE(S) AMPUTATION 05/21/2006    Current Outpatient Prescriptions on File Prior to Visit  Medication Sig Dispense Refill  . ACCU-CHEK FASTCLIX LANCETS MISC Check blood sugar 4 times a day before meals and bedtime dx code 250.02 insulin requiring 102 each 6  . atorvastatin (LIPITOR) 20 MG tablet TAKE 1 TABLET(20 MG) BY MOUTH DAILY 90 tablet 3  .  BESIVANCE 0.6 % SUSP Place 1 drop into both eyes 4 (four) times daily.   12  . Blood Glucose Monitoring Suppl (ACCU-CHEK AVIVA PLUS) W/DEVICE KIT 1 each by Does not apply route 4 (four) times daily -  with meals and at bedtime. 1 kit 0  . Cholecalciferol (VITAMIN D3) 2000 UNITS capsule Take 1 capsule (2,000 Units total) by mouth daily. 90 capsule 0  . diclofenac sodium (VOLTAREN) 1 % GEL Apply 4 g topically 4 (four) times daily. 100 g 2  . docusate sodium (COLACE) 100 MG capsule Take 1 capsule (100 mg total) by mouth daily as needed for mild constipation. 14 capsule 0  . ELIQUIS 5 MG TABS tablet TAKE 1 TABLET(5 MG) BY MOUTH TWICE DAILY 180 tablet 3  . enalapril (VASOTEC) 20 MG tablet TAKE 2 TABLETS(40 MG) BY MOUTH DAILY 180 tablet 1  . furosemide (LASIX) 40 MG tablet TAKE 1 TABLET(40 MG) BY MOUTH TWICE DAILY 60 tablet 3  . gabapentin (NEURONTIN) 300 MG capsule Take 1 capsule (300 mg total) by mouth at bedtime. 30 capsule 3  . glucose blood (ACCU-CHEK AVIVA PLUS) test strip USE TO CHECK BLOOD FOUR TIMES DAILY DBEFORE MEALS AND AT BEDTIME 150 each 3  . HYDROcodone-acetaminophen (NORCO) 7.5-325 MG tablet Take 1 tablet by mouth every 6 (six) hours as needed for moderate pain. 130 tablet 0  . Insulin Glargine (TOUJEO SOLOSTAR) 300 UNIT/ML SOPN Inject 60-90 Units into the skin 2 (two) times daily. 45 pen 2  . Insulin Pen Needle 31G X 5 MM MISC Use for insulin injection 5 times a day. 150 each 5  .  insulin regular human CONCENTRATED (HUMULIN R U-500 KWIKPEN) 500 UNIT/ML kwikpen INJECT 20 UNITS AT BREAKFAST, 60 UNITS AT LUNCH, AND 70 UNITS AT DINNER 18 mL 2  . LUMIGAN 0.01 % SOLN Place 1 drop into both eyes at bedtime.     . metoprolol succinate (TOPROL-XL) 25 MG 24 hr tablet TAKE 1 TABLET(25 MG) BY MOUTH DAILY 90 tablet 2  . omeprazole (PRILOSEC) 20 MG capsule TAKE 1 CAPSULE(20 MG) BY MOUTH DAILY 90 capsule 0  . PROAIR HFA 108 (90 Base) MCG/ACT inhaler INHALE 2 PUFFS INTO THE LUNGS BY MOUTH EVERY 6 HOURS  AS NEEDED FOR WHEEZING OR SHORTNESS OF BREATH 8.5 g 6  . promethazine (PHENERGAN) 25 MG tablet Take 1 tablet (25 mg total) by mouth every 8 (eight) hours as needed for nausea or vomiting. 30 tablet 2  . silver sulfADIAZINE (SILVADENE) 1 % cream Apply 1 application topically daily. 50 g 1  . SIMBRINZA 1-0.2 % SUSP Place 1 drop into both eyes 2 (two) times daily. Take as instructed by Dr. Katy Fitch.    Nelva Nay SOLOSTAR 300 UNIT/ML SOPN INJECT 74 UNITS INTO SKIN TWICE DAILY 9 pen 0  . VICTOZA 18 MG/3ML SOPN ADMINISTER 1.8 MG UNDER THE SKIN AT THE SAME TIME EVERY DAY 9 mL 0   No current facility-administered medications on file prior to visit.     Allergies  Allergen Reactions  . Vancomycin     REACTION: ARF    Objective: There were no vitals filed for this visit.  General: No acute distress, AAOx3  Left foot: Plantar forefoot, wound graft fully incorporated with granular wound bed measuring 2.5x1.5x0.2cm (smaller than previous) with mild periwound maceration, decreased swelling to left foot, no erythema, no warmth, no active drainage however on inner guaze there is drainage, decreased since last week with no acute signs of infection noted, Capillary fill time <3 seconds in all digits remaining, amputation status of 2nd and 3rd toes. No pain with calf compression.   Assessment and Plan:  Problem List Items Addressed This Visit    None    Visit Diagnoses    Diabetic ulcer of left foot associated with type 2 diabetes mellitus, with fat layer exposed, unspecified part of foot ([place])    -  Primary   Foot amputation status, left ([place])       S/P foot surgery, left           -Patient seen and evaluated -Cleansed wound debirded wound margins excisionlly to healthy bleeding margins and applied Grafix core 3x4cm (graft #6 to site) in total to wound bed, lot no [id number], unit no [id number] exp Jan 26,2021, part no [id number] without waste, secured with adaptic and steristrips all covered with hydrofore blue  and dry dressing   -Advised patient to make sure to keep dressings clean, dry, and intact allowing nursing to change absorbant dressing and outer gauze dressings only; do not touch steristrips or adaptic -Advised patient to continue with forefoot offloading post-op shoe on left foot with use of cane -Advised patient to limit activity to necessity  -Advised patient to ice and elevate as necessary  -Will plan for re-application of [place] Grafix to wound bed at next visit. In the meantime, patient to call office if any issues or problems arise.   Landis Martins, DPM

## 2016-11-04 ENCOUNTER — Encounter (INDEPENDENT_AMBULATORY_CARE_PROVIDER_SITE_OTHER): Payer: Medicare Other | Admitting: Ophthalmology

## 2016-11-04 DIAGNOSIS — E11311 Type 2 diabetes mellitus with unspecified diabetic retinopathy with macular edema: Secondary | ICD-10-CM | POA: Diagnosis not present

## 2016-11-04 DIAGNOSIS — I129 Hypertensive chronic kidney disease with stage 1 through stage 4 chronic kidney disease, or unspecified chronic kidney disease: Secondary | ICD-10-CM | POA: Diagnosis not present

## 2016-11-04 DIAGNOSIS — E1142 Type 2 diabetes mellitus with diabetic polyneuropathy: Secondary | ICD-10-CM | POA: Diagnosis not present

## 2016-11-04 DIAGNOSIS — Z7901 Long term (current) use of anticoagulants: Secondary | ICD-10-CM | POA: Diagnosis not present

## 2016-11-04 DIAGNOSIS — Z794 Long term (current) use of insulin: Secondary | ICD-10-CM | POA: Diagnosis not present

## 2016-11-04 DIAGNOSIS — L97522 Non-pressure chronic ulcer of other part of left foot with fat layer exposed: Secondary | ICD-10-CM | POA: Diagnosis not present

## 2016-11-04 DIAGNOSIS — E11621 Type 2 diabetes mellitus with foot ulcer: Secondary | ICD-10-CM | POA: Diagnosis not present

## 2016-11-04 DIAGNOSIS — N183 Chronic kidney disease, stage 3 (moderate): Secondary | ICD-10-CM | POA: Diagnosis not present

## 2016-11-04 DIAGNOSIS — Z4800 Encounter for change or removal of nonsurgical wound dressing: Secondary | ICD-10-CM | POA: Diagnosis not present

## 2016-11-04 DIAGNOSIS — E1122 Type 2 diabetes mellitus with diabetic chronic kidney disease: Secondary | ICD-10-CM | POA: Diagnosis not present

## 2016-11-04 DIAGNOSIS — Z89422 Acquired absence of other left toe(s): Secondary | ICD-10-CM | POA: Diagnosis not present

## 2016-11-06 ENCOUNTER — Telehealth: Payer: Self-pay | Admitting: Endocrinology

## 2016-11-06 DIAGNOSIS — N183 Chronic kidney disease, stage 3 (moderate): Secondary | ICD-10-CM | POA: Diagnosis not present

## 2016-11-06 DIAGNOSIS — Z7901 Long term (current) use of anticoagulants: Secondary | ICD-10-CM | POA: Diagnosis not present

## 2016-11-06 DIAGNOSIS — E1122 Type 2 diabetes mellitus with diabetic chronic kidney disease: Secondary | ICD-10-CM | POA: Diagnosis not present

## 2016-11-06 DIAGNOSIS — E11311 Type 2 diabetes mellitus with unspecified diabetic retinopathy with macular edema: Secondary | ICD-10-CM | POA: Diagnosis not present

## 2016-11-06 DIAGNOSIS — E1142 Type 2 diabetes mellitus with diabetic polyneuropathy: Secondary | ICD-10-CM | POA: Diagnosis not present

## 2016-11-06 DIAGNOSIS — Z89422 Acquired absence of other left toe(s): Secondary | ICD-10-CM | POA: Diagnosis not present

## 2016-11-06 DIAGNOSIS — E11621 Type 2 diabetes mellitus with foot ulcer: Secondary | ICD-10-CM | POA: Diagnosis not present

## 2016-11-06 DIAGNOSIS — Z794 Long term (current) use of insulin: Secondary | ICD-10-CM | POA: Diagnosis not present

## 2016-11-06 DIAGNOSIS — I129 Hypertensive chronic kidney disease with stage 1 through stage 4 chronic kidney disease, or unspecified chronic kidney disease: Secondary | ICD-10-CM | POA: Diagnosis not present

## 2016-11-06 DIAGNOSIS — Z4800 Encounter for change or removal of nonsurgical wound dressing: Secondary | ICD-10-CM | POA: Diagnosis not present

## 2016-11-06 DIAGNOSIS — L97522 Non-pressure chronic ulcer of other part of left foot with fat layer exposed: Secondary | ICD-10-CM | POA: Diagnosis not present

## 2016-11-06 NOTE — Telephone Encounter (Signed)
Pt called in and said that he needs his script for the new increased Humulin dosage sent over to the [facility] on [facility].

## 2016-11-09 DIAGNOSIS — I129 Hypertensive chronic kidney disease with stage 1 through stage 4 chronic kidney disease, or unspecified chronic kidney disease: Secondary | ICD-10-CM | POA: Diagnosis not present

## 2016-11-09 DIAGNOSIS — L97522 Non-pressure chronic ulcer of other part of left foot with fat layer exposed: Secondary | ICD-10-CM | POA: Diagnosis not present

## 2016-11-09 DIAGNOSIS — E11311 Type 2 diabetes mellitus with unspecified diabetic retinopathy with macular edema: Secondary | ICD-10-CM | POA: Diagnosis not present

## 2016-11-09 DIAGNOSIS — Z7901 Long term (current) use of anticoagulants: Secondary | ICD-10-CM | POA: Diagnosis not present

## 2016-11-09 DIAGNOSIS — Z89422 Acquired absence of other left toe(s): Secondary | ICD-10-CM | POA: Diagnosis not present

## 2016-11-09 DIAGNOSIS — N183 Chronic kidney disease, stage 3 (moderate): Secondary | ICD-10-CM | POA: Diagnosis not present

## 2016-11-09 DIAGNOSIS — Z794 Long term (current) use of insulin: Secondary | ICD-10-CM | POA: Diagnosis not present

## 2016-11-09 DIAGNOSIS — E11621 Type 2 diabetes mellitus with foot ulcer: Secondary | ICD-10-CM | POA: Diagnosis not present

## 2016-11-09 DIAGNOSIS — Z4800 Encounter for change or removal of nonsurgical wound dressing: Secondary | ICD-10-CM | POA: Diagnosis not present

## 2016-11-09 DIAGNOSIS — E1122 Type 2 diabetes mellitus with diabetic chronic kidney disease: Secondary | ICD-10-CM | POA: Diagnosis not present

## 2016-11-09 DIAGNOSIS — E1142 Type 2 diabetes mellitus with diabetic polyneuropathy: Secondary | ICD-10-CM | POA: Diagnosis not present

## 2016-11-10 ENCOUNTER — Other Ambulatory Visit: Payer: Self-pay | Admitting: Dietician

## 2016-11-10 ENCOUNTER — Encounter: Payer: Self-pay | Admitting: Sports Medicine

## 2016-11-10 ENCOUNTER — Ambulatory Visit (INDEPENDENT_AMBULATORY_CARE_PROVIDER_SITE_OTHER): Payer: Medicare Other | Admitting: Sports Medicine

## 2016-11-10 DIAGNOSIS — E1165 Type 2 diabetes mellitus with hyperglycemia: Principal | ICD-10-CM

## 2016-11-10 DIAGNOSIS — L97522 Non-pressure chronic ulcer of other part of left foot with fat layer exposed: Secondary | ICD-10-CM | POA: Diagnosis not present

## 2016-11-10 DIAGNOSIS — E11621 Type 2 diabetes mellitus with foot ulcer: Secondary | ICD-10-CM | POA: Diagnosis not present

## 2016-11-10 DIAGNOSIS — Z794 Long term (current) use of insulin: Principal | ICD-10-CM

## 2016-11-10 DIAGNOSIS — IMO0002 Reserved for concepts with insufficient information to code with codable children: Secondary | ICD-10-CM

## 2016-11-10 DIAGNOSIS — Z9889 Other specified postprocedural states: Secondary | ICD-10-CM

## 2016-11-10 DIAGNOSIS — N183 Chronic kidney disease, stage 3 (moderate): Principal | ICD-10-CM

## 2016-11-10 DIAGNOSIS — E1122 Type 2 diabetes mellitus with diabetic chronic kidney disease: Secondary | ICD-10-CM

## 2016-11-10 DIAGNOSIS — Z89432 Acquired absence of left foot: Secondary | ICD-10-CM

## 2016-11-10 NOTE — Progress Notes (Signed)
Subjective: [name] is a 66 y.o. male patient seen today in office for POV #18 (DOS 06-15-16), S/P Left wound debridement and placement of stravix allograft. Patient had grafix #6 placed last visit, denies pain at surgical site, denies calf pain, denies headache, chest pain, shortness of breath, nausea, vomiting, fever, or chills. Patient states that he is doing well with no issues. Has home nursing every other day. No other issues noted.   Patient Active Problem List   Diagnosis Date Noted  . Chronic pain syndrome 09/18/2015  . Preventative health care 03/12/2015  . Family history of coronary artery disease in father 12/14/2014  . Acute osteomyelitis of toe of left foot ([place])   . Foot ulcer, left ([place]) 12/13/2014  . Paroxysmal atrial fibrillation ([place]) 12/13/2014  . Elevated alkaline phosphatase level 06/11/2014  . Constipation 05/31/2014  . Vitamin D deficiency 11/17/2013  . Anemia 02/10/2012  . Hypertension 11/18/2011  . Wheezing 06/08/2011  . Obstructive sleep apnea 12/12/2009  . GERD 06/27/2008  . Diabetes type 2, uncontrolled ([place]) 12/28/2007  . DIABETIC MACULAR EDEMA 10/07/2007  . Dyslipidemia 08/15/2007  . BACKGROUND DIABETIC RETINOPATHY 08/15/2007  . ERECTILE DYSFUNCTION 05/31/2007  . HEARING LOSS, SENSORINEURAL, BILATERAL 01/03/2007  . Diabetic peripheral neuropathy associated with type 2 diabetes mellitus ([place]) 12/16/2006  . Chronic kidney disease, stage III (moderate) 12/01/2006  . Morbid obesity ([place]) 08/23/2006  . STATUS, OTHER TOE(S) AMPUTATION 05/21/2006    Current Outpatient Prescriptions on File Prior to Visit  Medication Sig Dispense Refill  . ACCU-CHEK FASTCLIX LANCETS MISC Check blood sugar 4 times a day before meals and bedtime dx code 250.02 insulin requiring 102 each 6  . atorvastatin (LIPITOR) 20 MG tablet TAKE 1 TABLET(20 MG) BY MOUTH DAILY 90 tablet 3  . BESIVANCE 0.6 % SUSP Place 1 drop into both eyes 4 (four) times daily.   12  . Blood  Glucose Monitoring Suppl (ACCU-CHEK AVIVA PLUS) W/DEVICE KIT 1 each by Does not apply route 4 (four) times daily -  with meals and at bedtime. 1 kit 0  . Cholecalciferol (VITAMIN D3) 2000 UNITS capsule Take 1 capsule (2,000 Units total) by mouth daily. 90 capsule 0  . diclofenac sodium (VOLTAREN) 1 % GEL Apply 4 g topically 4 (four) times daily. 100 g 2  . docusate sodium (COLACE) 100 MG capsule Take 1 capsule (100 mg total) by mouth daily as needed for mild constipation. 14 capsule 0  . ELIQUIS 5 MG TABS tablet TAKE 1 TABLET(5 MG) BY MOUTH TWICE DAILY 180 tablet 3  . enalapril (VASOTEC) 20 MG tablet TAKE 2 TABLETS(40 MG) BY MOUTH DAILY 180 tablet 1  . furosemide (LASIX) 40 MG tablet TAKE 1 TABLET(40 MG) BY MOUTH TWICE DAILY 60 tablet 3  . gabapentin (NEURONTIN) 300 MG capsule Take 1 capsule (300 mg total) by mouth at bedtime. 30 capsule 3  . glucose blood (ACCU-CHEK AVIVA PLUS) test strip USE TO CHECK BLOOD FOUR TIMES DAILY DBEFORE MEALS AND AT BEDTIME 150 each 3  . HYDROcodone-acetaminophen (NORCO) 7.5-325 MG tablet Take 1 tablet by mouth every 6 (six) hours as needed for moderate pain. 130 tablet 0  . Insulin Glargine (TOUJEO SOLOSTAR) 300 UNIT/ML SOPN Inject 60-90 Units into the skin 2 (two) times daily. 45 pen 2  . Insulin Pen Needle 31G X 5 MM MISC Use for insulin injection 5 times a day. 150 each 5  . insulin regular human CONCENTRATED (HUMULIN R U-500 KWIKPEN) 500 UNIT/ML kwikpen INJECT 20 UNITS AT [place],  60 UNITS AT LUNCH, AND 70 UNITS AT DINNER 18 mL 2  . LUMIGAN 0.01 % SOLN Place 1 drop into both eyes at bedtime.     . metoprolol succinate (TOPROL-XL) 25 MG 24 hr tablet TAKE 1 TABLET(25 MG) BY MOUTH DAILY 90 tablet 2  . omeprazole (PRILOSEC) 20 MG capsule TAKE 1 CAPSULE(20 MG) BY MOUTH DAILY 90 capsule 0  . PROAIR HFA 108 (90 Base) MCG/ACT inhaler INHALE 2 PUFFS INTO THE LUNGS BY MOUTH EVERY 6 HOURS AS NEEDED FOR WHEEZING OR SHORTNESS OF BREATH 8.5 g 6  . promethazine (PHENERGAN) 25  MG tablet Take 1 tablet (25 mg total) by mouth every 8 (eight) hours as needed for nausea or vomiting. 30 tablet 2  . silver sulfADIAZINE (SILVADENE) 1 % cream Apply 1 application topically daily. 50 g 1  . SIMBRINZA 1-0.2 % SUSP Place 1 drop into both eyes 2 (two) times daily. Take as instructed by Dr. Katy Fitch.    Nelva Nay SOLOSTAR 300 UNIT/ML SOPN INJECT 74 UNITS INTO SKIN TWICE DAILY 9 pen 0  . VICTOZA 18 MG/3ML SOPN ADMINISTER 1.8 MG UNDER THE SKIN AT THE SAME TIME EVERY DAY 9 mL 0   No current facility-administered medications on file prior to visit.     Allergies  Allergen Reactions  . Vancomycin     REACTION: ARF    Objective: There were no vitals filed for this visit.  General: No acute distress, AAOx3  Left foot: Plantar forefoot, wound graft fully incorporated with granular wound bed measuring 2.5x1.5x0.2cm (same as previous) with mild periwound maceration, decreased swelling to left foot, no erythema, no warmth, no active drainage however on inner guaze there is drainage, decreased since last week with no acute signs of infection noted, Capillary fill time <3 seconds in all digits remaining, amputation status of 2nd and 3rd toes. No pain with calf compression.   Assessment and Plan:  Problem List Items Addressed This Visit    None    Visit Diagnoses    Diabetic ulcer of left foot associated with type 2 diabetes mellitus, with fat layer exposed, unspecified part of foot ([place])    -  Primary   Foot amputation status, left ([place])       S/P foot surgery, left           -Patient seen and evaluated -Cleansed wound debirded wound margins excisionlly to healthy bleeding margins and applied Grafix core 3x4cm (graft #7 to site) in total to wound bed, lot no [id number], unit no [id number] exp March 2,2021, part no [id number] without waste, secured with adaptic and steristrips all covered with hydrofore blue and dry dressing   -Advised patient to make sure to keep dressings clean, dry, and intact  allowing nursing to change absorbant dressing and outer gauze dressings only; do not touch steristrips or adaptic -Advised patient to continue with forefoot offloading post-op shoe on left foot with use of cane -Advised patient to limit activity to necessity  -Advised patient to ice and elevate as necessary  -Will plan for re-application of Osiris Grafix to wound bed at next visit. In the meantime, patient to call office if any issues or problems arise.   Landis Martins, DPM

## 2016-11-10 NOTE — Telephone Encounter (Signed)
Mr. Ribas says his meter went out on the blink and he request a new one with supplies for testing 5 times a day be sent to [facility] on [address].

## 2016-11-11 DIAGNOSIS — E11311 Type 2 diabetes mellitus with unspecified diabetic retinopathy with macular edema: Secondary | ICD-10-CM | POA: Diagnosis not present

## 2016-11-11 DIAGNOSIS — L97522 Non-pressure chronic ulcer of other part of left foot with fat layer exposed: Secondary | ICD-10-CM | POA: Diagnosis not present

## 2016-11-11 DIAGNOSIS — I129 Hypertensive chronic kidney disease with stage 1 through stage 4 chronic kidney disease, or unspecified chronic kidney disease: Secondary | ICD-10-CM | POA: Diagnosis not present

## 2016-11-11 DIAGNOSIS — N183 Chronic kidney disease, stage 3 (moderate): Secondary | ICD-10-CM | POA: Diagnosis not present

## 2016-11-11 DIAGNOSIS — Z4800 Encounter for change or removal of nonsurgical wound dressing: Secondary | ICD-10-CM | POA: Diagnosis not present

## 2016-11-11 DIAGNOSIS — E1142 Type 2 diabetes mellitus with diabetic polyneuropathy: Secondary | ICD-10-CM | POA: Diagnosis not present

## 2016-11-11 DIAGNOSIS — Z794 Long term (current) use of insulin: Secondary | ICD-10-CM | POA: Diagnosis not present

## 2016-11-11 DIAGNOSIS — Z7901 Long term (current) use of anticoagulants: Secondary | ICD-10-CM | POA: Diagnosis not present

## 2016-11-11 DIAGNOSIS — E1122 Type 2 diabetes mellitus with diabetic chronic kidney disease: Secondary | ICD-10-CM | POA: Diagnosis not present

## 2016-11-11 DIAGNOSIS — Z89422 Acquired absence of other left toe(s): Secondary | ICD-10-CM | POA: Diagnosis not present

## 2016-11-11 DIAGNOSIS — E11621 Type 2 diabetes mellitus with foot ulcer: Secondary | ICD-10-CM | POA: Diagnosis not present

## 2016-11-11 MED ORDER — ACCU-CHEK AVIVA PLUS W/DEVICE KIT: PACK | 0 refills | Status: DC

## 2016-11-11 MED ORDER — GLUCOSE BLOOD VI STRP: ORAL_STRIP | 3 refills | Status: DC

## 2016-11-11 MED ORDER — ACCU-CHEK FASTCLIX LANCETS MISC: 3 refills | Status: DC

## 2016-11-12 MED ORDER — INSULIN REGULAR HUMAN (CONC) 500 UNIT/ML ~~LOC~~ SOPN: PEN_INJECTOR | SUBCUTANEOUS | 2 refills | Status: DC

## 2016-11-12 NOTE — Telephone Encounter (Signed)
Sent updated script to [facility] pharmacy for patient.

## 2016-11-13 DIAGNOSIS — E1122 Type 2 diabetes mellitus with diabetic chronic kidney disease: Secondary | ICD-10-CM | POA: Diagnosis not present

## 2016-11-13 DIAGNOSIS — Z89422 Acquired absence of other left toe(s): Secondary | ICD-10-CM | POA: Diagnosis not present

## 2016-11-13 DIAGNOSIS — I129 Hypertensive chronic kidney disease with stage 1 through stage 4 chronic kidney disease, or unspecified chronic kidney disease: Secondary | ICD-10-CM | POA: Diagnosis not present

## 2016-11-13 DIAGNOSIS — E11621 Type 2 diabetes mellitus with foot ulcer: Secondary | ICD-10-CM | POA: Diagnosis not present

## 2016-11-13 DIAGNOSIS — Z794 Long term (current) use of insulin: Secondary | ICD-10-CM | POA: Diagnosis not present

## 2016-11-13 DIAGNOSIS — Z7901 Long term (current) use of anticoagulants: Secondary | ICD-10-CM | POA: Diagnosis not present

## 2016-11-13 DIAGNOSIS — E11311 Type 2 diabetes mellitus with unspecified diabetic retinopathy with macular edema: Secondary | ICD-10-CM | POA: Diagnosis not present

## 2016-11-13 DIAGNOSIS — N183 Chronic kidney disease, stage 3 (moderate): Secondary | ICD-10-CM | POA: Diagnosis not present

## 2016-11-13 DIAGNOSIS — E1142 Type 2 diabetes mellitus with diabetic polyneuropathy: Secondary | ICD-10-CM | POA: Diagnosis not present

## 2016-11-13 DIAGNOSIS — L97522 Non-pressure chronic ulcer of other part of left foot with fat layer exposed: Secondary | ICD-10-CM | POA: Diagnosis not present

## 2016-11-13 DIAGNOSIS — Z4800 Encounter for change or removal of nonsurgical wound dressing: Secondary | ICD-10-CM | POA: Diagnosis not present

## 2016-11-14 DIAGNOSIS — G4733 Obstructive sleep apnea (adult) (pediatric): Secondary | ICD-10-CM | POA: Diagnosis not present

## 2016-11-16 DIAGNOSIS — E11621 Type 2 diabetes mellitus with foot ulcer: Secondary | ICD-10-CM | POA: Diagnosis not present

## 2016-11-16 DIAGNOSIS — Z89422 Acquired absence of other left toe(s): Secondary | ICD-10-CM | POA: Diagnosis not present

## 2016-11-16 DIAGNOSIS — E1122 Type 2 diabetes mellitus with diabetic chronic kidney disease: Secondary | ICD-10-CM | POA: Diagnosis not present

## 2016-11-16 DIAGNOSIS — N183 Chronic kidney disease, stage 3 (moderate): Secondary | ICD-10-CM | POA: Diagnosis not present

## 2016-11-16 DIAGNOSIS — L97522 Non-pressure chronic ulcer of other part of left foot with fat layer exposed: Secondary | ICD-10-CM | POA: Diagnosis not present

## 2016-11-16 DIAGNOSIS — E1142 Type 2 diabetes mellitus with diabetic polyneuropathy: Secondary | ICD-10-CM | POA: Diagnosis not present

## 2016-11-16 DIAGNOSIS — Z4800 Encounter for change or removal of nonsurgical wound dressing: Secondary | ICD-10-CM | POA: Diagnosis not present

## 2016-11-16 DIAGNOSIS — Z794 Long term (current) use of insulin: Secondary | ICD-10-CM | POA: Diagnosis not present

## 2016-11-16 DIAGNOSIS — Z7901 Long term (current) use of anticoagulants: Secondary | ICD-10-CM | POA: Diagnosis not present

## 2016-11-16 DIAGNOSIS — I129 Hypertensive chronic kidney disease with stage 1 through stage 4 chronic kidney disease, or unspecified chronic kidney disease: Secondary | ICD-10-CM | POA: Diagnosis not present

## 2016-11-16 DIAGNOSIS — E11311 Type 2 diabetes mellitus with unspecified diabetic retinopathy with macular edema: Secondary | ICD-10-CM | POA: Diagnosis not present

## 2016-11-17 ENCOUNTER — Ambulatory Visit: Payer: Medicare Other | Admitting: Sports Medicine

## 2016-11-18 ENCOUNTER — Ambulatory Visit (INDEPENDENT_AMBULATORY_CARE_PROVIDER_SITE_OTHER): Payer: Medicare Other | Admitting: Podiatry

## 2016-11-18 DIAGNOSIS — L97522 Non-pressure chronic ulcer of other part of left foot with fat layer exposed: Secondary | ICD-10-CM

## 2016-11-18 DIAGNOSIS — E0842 Diabetes mellitus due to underlying condition with diabetic polyneuropathy: Secondary | ICD-10-CM

## 2016-11-18 DIAGNOSIS — I70245 Atherosclerosis of native arteries of left leg with ulceration of other part of foot: Secondary | ICD-10-CM

## 2016-11-18 DIAGNOSIS — E11621 Type 2 diabetes mellitus with foot ulcer: Secondary | ICD-10-CM | POA: Diagnosis not present

## 2016-11-18 NOTE — Progress Notes (Signed)
   Subjective:  Patient [name] presents today for evaluation of the left foot wound secondary to diabetes mellitus. Patient has received multiple applications of Osiris Grafix. Patient presents today for additional application of the skin graft. Patient believes that he's doing much better.   Objective/Physical Exam General: The patient is alert and oriented x3 in no acute distress.  Dermatology:  Wound #1 noted to the left plantar forefoot measuring approximately [phone] cm (LxWxD).   To the noted ulceration(s), there is no eschar. There is a moderate amount of slough, fibrin, and necrotic tissue noted. Granulation tissue and wound base is red. There is a minimal amount of serosanguineous drainage noted. There is no exposed bone muscle-tendon ligament or joint. There is no malodor. Periwound integrity is intact. Skin is warm, dry and supple bilateral lower extremities.  Vascular: Palpable pedal pulses bilaterally. No edema or erythema noted. Capillary refill within normal limits.  Neurological: Epicritic and protective threshold absent bilaterally.   Musculoskeletal Exam: Range of motion within normal limits to all pedal and ankle joints bilateral. Muscle strength 5/5 in all groups bilateral.   Assessment: #1 left plantar forefoot ulceration secondary to diabetes mellitus #2 diabetes mellitus w/ peripheral neuropathy   Plan of Care:  #1 Patient was evaluated. #2 the wound was cleansed with normal saline #3 medically necessary application of 12 cm of Grafix Core was applied to the ulceration site to a total wound measurement of 5 cm. Please note describes medically necessary. The patient is felt all conservative modalities treatment and satisfactory alleviation of symptoms for the patient and the patient's had the wound for greater than 6 weeks. #4 graft was then fixated with Steri-Strips followed by dry sterile dressings #5 return to clinic in 1 week with Dr. Cannon Kettle  Lot  number: [id number] Unit number: [id number] Expiration: [id number] Part number: [id number]  Edrick Kins, DPM [facility]  Dr. Edrick Kins, [address]                                        [place], [place] [address]                Office [phone]  Fax [phone]

## 2016-11-19 DIAGNOSIS — Z961 Presence of intraocular lens: Secondary | ICD-10-CM | POA: Diagnosis not present

## 2016-11-19 DIAGNOSIS — H43822 Vitreomacular adhesion, left eye: Secondary | ICD-10-CM | POA: Diagnosis not present

## 2016-11-19 DIAGNOSIS — H31093 Other chorioretinal scars, bilateral: Secondary | ICD-10-CM | POA: Diagnosis not present

## 2016-11-19 DIAGNOSIS — E113311 Type 2 diabetes mellitus with moderate nonproliferative diabetic retinopathy with macular edema, right eye: Secondary | ICD-10-CM | POA: Diagnosis not present

## 2016-11-19 DIAGNOSIS — E113512 Type 2 diabetes mellitus with proliferative diabetic retinopathy with macular edema, left eye: Secondary | ICD-10-CM | POA: Diagnosis not present

## 2016-11-20 DIAGNOSIS — I129 Hypertensive chronic kidney disease with stage 1 through stage 4 chronic kidney disease, or unspecified chronic kidney disease: Secondary | ICD-10-CM | POA: Diagnosis not present

## 2016-11-20 DIAGNOSIS — N183 Chronic kidney disease, stage 3 (moderate): Secondary | ICD-10-CM | POA: Diagnosis not present

## 2016-11-20 DIAGNOSIS — Z89422 Acquired absence of other left toe(s): Secondary | ICD-10-CM | POA: Diagnosis not present

## 2016-11-20 DIAGNOSIS — Z7901 Long term (current) use of anticoagulants: Secondary | ICD-10-CM | POA: Diagnosis not present

## 2016-11-20 DIAGNOSIS — L97522 Non-pressure chronic ulcer of other part of left foot with fat layer exposed: Secondary | ICD-10-CM | POA: Diagnosis not present

## 2016-11-20 DIAGNOSIS — E11621 Type 2 diabetes mellitus with foot ulcer: Secondary | ICD-10-CM | POA: Diagnosis not present

## 2016-11-20 DIAGNOSIS — E1122 Type 2 diabetes mellitus with diabetic chronic kidney disease: Secondary | ICD-10-CM | POA: Diagnosis not present

## 2016-11-20 DIAGNOSIS — Z794 Long term (current) use of insulin: Secondary | ICD-10-CM | POA: Diagnosis not present

## 2016-11-20 DIAGNOSIS — E11311 Type 2 diabetes mellitus with unspecified diabetic retinopathy with macular edema: Secondary | ICD-10-CM | POA: Diagnosis not present

## 2016-11-20 DIAGNOSIS — Z4800 Encounter for change or removal of nonsurgical wound dressing: Secondary | ICD-10-CM | POA: Diagnosis not present

## 2016-11-20 DIAGNOSIS — E1142 Type 2 diabetes mellitus with diabetic polyneuropathy: Secondary | ICD-10-CM | POA: Diagnosis not present

## 2016-11-24 ENCOUNTER — Encounter: Payer: Self-pay | Admitting: Sports Medicine

## 2016-11-24 ENCOUNTER — Ambulatory Visit (INDEPENDENT_AMBULATORY_CARE_PROVIDER_SITE_OTHER): Payer: Medicare Other | Admitting: Sports Medicine

## 2016-11-24 DIAGNOSIS — E0842 Diabetes mellitus due to underlying condition with diabetic polyneuropathy: Secondary | ICD-10-CM

## 2016-11-24 DIAGNOSIS — IMO0002 Reserved for concepts with insufficient information to code with codable children: Secondary | ICD-10-CM

## 2016-11-24 DIAGNOSIS — L97522 Non-pressure chronic ulcer of other part of left foot with fat layer exposed: Secondary | ICD-10-CM | POA: Diagnosis not present

## 2016-11-24 DIAGNOSIS — E11621 Type 2 diabetes mellitus with foot ulcer: Secondary | ICD-10-CM

## 2016-11-24 DIAGNOSIS — I70245 Atherosclerosis of native arteries of left leg with ulceration of other part of foot: Secondary | ICD-10-CM | POA: Diagnosis not present

## 2016-11-24 DIAGNOSIS — Z89432 Acquired absence of left foot: Secondary | ICD-10-CM

## 2016-11-24 NOTE — Progress Notes (Signed)
Subjective: [name] is a 66 y.o. male patient seen today in office for POV #20 (DOS 06-15-16), S/P Left wound debridement and placement of stravix allograft. Patient had grafix #8 placed last visit by Dr. Amalia Hailey, denies pain at surgical site, denies calf pain, denies headache, chest pain, shortness of breath, nausea, vomiting, fever, or chills. Patient states that he is doing well with no issues. Has home nursing every other day. Reports that he had funerals to attend so he did miss a few visits. No other issues noted.   Patient Active Problem List   Diagnosis Date Noted  . Chronic pain syndrome 09/18/2015  . Preventative health care 03/12/2015  . Family history of coronary artery disease in father 12/14/2014  . Acute osteomyelitis of toe of left foot ([place])   . Foot ulcer, left ([place]) 12/13/2014  . Paroxysmal atrial fibrillation ([place]) 12/13/2014  . Elevated alkaline phosphatase level 06/11/2014  . Constipation 05/31/2014  . Vitamin D deficiency 11/17/2013  . Anemia 02/10/2012  . Hypertension 11/18/2011  . Wheezing 06/08/2011  . Obstructive sleep apnea 12/12/2009  . GERD 06/27/2008  . Diabetes type 2, uncontrolled ([place]) 12/28/2007  . DIABETIC MACULAR EDEMA 10/07/2007  . Dyslipidemia 08/15/2007  . BACKGROUND DIABETIC RETINOPATHY 08/15/2007  . ERECTILE DYSFUNCTION 05/31/2007  . HEARING LOSS, SENSORINEURAL, BILATERAL 01/03/2007  . Diabetic peripheral neuropathy associated with type 2 diabetes mellitus ([place]) 12/16/2006  . Chronic kidney disease, stage III (moderate) 12/01/2006  . Morbid obesity ([place]) 08/23/2006  . STATUS, OTHER TOE(S) AMPUTATION 05/21/2006    Current Outpatient Prescriptions on File Prior to Visit  Medication Sig Dispense Refill  . ACCU-CHEK FASTCLIX LANCETS MISC Check blood sugar 5 times a day 408 each 3  . atorvastatin (LIPITOR) 20 MG tablet TAKE 1 TABLET(20 MG) BY MOUTH DAILY 90 tablet 3  . BESIVANCE 0.6 % SUSP Place 1 drop into both eyes 4 (four) times  daily.   12  . Blood Glucose Monitoring Suppl (ACCU-CHEK AVIVA PLUS) w/Device KIT Check blood sugar five times a day 1 kit 0  . Cholecalciferol (VITAMIN D3) 2000 UNITS capsule Take 1 capsule (2,000 Units total) by mouth daily. 90 capsule 0  . diclofenac sodium (VOLTAREN) 1 % GEL Apply 4 g topically 4 (four) times daily. 100 g 2  . docusate sodium (COLACE) 100 MG capsule Take 1 capsule (100 mg total) by mouth daily as needed for mild constipation. 14 capsule 0  . ELIQUIS 5 MG TABS tablet TAKE 1 TABLET(5 MG) BY MOUTH TWICE DAILY 180 tablet 3  . enalapril (VASOTEC) 20 MG tablet TAKE 2 TABLETS(40 MG) BY MOUTH DAILY 180 tablet 1  . furosemide (LASIX) 40 MG tablet TAKE 1 TABLET(40 MG) BY MOUTH TWICE DAILY 60 tablet 3  . gabapentin (NEURONTIN) 300 MG capsule Take 1 capsule (300 mg total) by mouth at bedtime. 30 capsule 3  . glucose blood (ACCU-CHEK AVIVA PLUS) test strip USE TO CHECK BLOOD Five TIMES DAILY 450 each 3  . Insulin Glargine (TOUJEO SOLOSTAR) 300 UNIT/ML SOPN Inject 60-90 Units into the skin 2 (two) times daily. 45 pen 2  . Insulin Pen Needle 31G X 5 MM MISC Use for insulin injection 5 times a day. 150 each 5  . insulin regular human CONCENTRATED (HUMULIN R U-500 KWIKPEN) 500 UNIT/ML kwikpen INJECT 35 UNITS AT BREAKFAST, 60 UNITS AT LUNCH, AND 70 UNITS AT DINNER 20 mL 2  . LUMIGAN 0.01 % SOLN Place 1 drop into both eyes at bedtime.     . metoprolol  succinate (TOPROL-XL) 25 MG 24 hr tablet TAKE 1 TABLET(25 MG) BY MOUTH DAILY 90 tablet 2  . omeprazole (PRILOSEC) 20 MG capsule TAKE 1 CAPSULE(20 MG) BY MOUTH DAILY 90 capsule 0  . PROAIR HFA 108 (90 Base) MCG/ACT inhaler INHALE 2 PUFFS INTO THE LUNGS BY MOUTH EVERY 6 HOURS AS NEEDED FOR WHEEZING OR SHORTNESS OF BREATH 8.5 g 6  . promethazine (PHENERGAN) 25 MG tablet Take 1 tablet (25 mg total) by mouth every 8 (eight) hours as needed for nausea or vomiting. 30 tablet 2  . silver sulfADIAZINE (SILVADENE) 1 % cream Apply 1 application topically  daily. 50 g 1  . SIMBRINZA 1-0.2 % SUSP Place 1 drop into both eyes 2 (two) times daily. Take as instructed by Dr. Katy Fitch.    Nelva Nay SOLOSTAR 300 UNIT/ML SOPN INJECT 74 UNITS INTO SKIN TWICE DAILY 9 pen 0  . VICTOZA 18 MG/3ML SOPN ADMINISTER 1.8 MG UNDER THE SKIN AT THE SAME TIME EVERY DAY 9 mL 0  . HYDROcodone-acetaminophen (NORCO) 7.5-325 MG tablet Take 1 tablet by mouth every 6 (six) hours as needed for moderate pain. 130 tablet 0   No current facility-administered medications on file prior to visit.     Allergies  Allergen Reactions  . Vancomycin     REACTION: ARF    Objective: There were no vitals filed for this visit.  General: No acute distress, AAOx3  Left foot: Plantar forefoot, wound graft fully incorporated with granular wound bed measuring 2.2x1.2x0.2cm (smaller than previous) with mild periwound maceration, decreased swelling to left foot, no erythema, no warmth, no active drainage however on inner guaze there is drainage, decreased since last week with no acute signs of infection noted, Capillary fill time <3 seconds in all digits remaining, amputation status of 2nd and 3rd toes. No pain with calf compression.   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[place])    -  Primary   Diabetes mellitus due to underlying condition with diabetic polyneuropathy, unspecified whether long term insulin use ([place])       Atherosclerosis of native arteries of left leg with ulceration of other part of foot ([place])       Diabetic ulcer of left foot associated with type 2 diabetes mellitus, with fat layer exposed, unspecified part of foot ([place])       Foot amputation status, left ([place])           -Patient seen and evaluated -Cleansed wound debirded wound margins excisionlly to healthy bleeding margins and applied Grafix core 3x4cm (graft #9 to site) in total to wound bed, lot no [id number], unit no [id number]  exp March 16,2021, part no [id number] without waste, secured with adaptic and steristrips all covered with hydrofore blue and dry dressing   -Advised patient to make sure to keep dressings clean, dry, and intact allowing nursing to change absorbant dressing and outer gauze dressings only; do not touch steristrips or adaptic -Advised patient to continue with forefoot offloading post-op shoe on left foot with use of cane -Advised patient to limit activity to necessity  -Advised patient to ice and elevate as necessary  -Will plan for re-application of Osiris Grafix to wound bed at next visit. In the meantime, patient to call office if any issues or problems arise.   Landis Martins, DPM

## 2016-11-25 DIAGNOSIS — Z7901 Long term (current) use of anticoagulants: Secondary | ICD-10-CM | POA: Diagnosis not present

## 2016-11-25 DIAGNOSIS — Z89422 Acquired absence of other left toe(s): Secondary | ICD-10-CM | POA: Diagnosis not present

## 2016-11-25 DIAGNOSIS — L97522 Non-pressure chronic ulcer of other part of left foot with fat layer exposed: Secondary | ICD-10-CM | POA: Diagnosis not present

## 2016-11-25 DIAGNOSIS — E11311 Type 2 diabetes mellitus with unspecified diabetic retinopathy with macular edema: Secondary | ICD-10-CM | POA: Diagnosis not present

## 2016-11-25 DIAGNOSIS — E1122 Type 2 diabetes mellitus with diabetic chronic kidney disease: Secondary | ICD-10-CM | POA: Diagnosis not present

## 2016-11-25 DIAGNOSIS — Z794 Long term (current) use of insulin: Secondary | ICD-10-CM | POA: Diagnosis not present

## 2016-11-25 DIAGNOSIS — E1142 Type 2 diabetes mellitus with diabetic polyneuropathy: Secondary | ICD-10-CM | POA: Diagnosis not present

## 2016-11-25 DIAGNOSIS — Z4800 Encounter for change or removal of nonsurgical wound dressing: Secondary | ICD-10-CM | POA: Diagnosis not present

## 2016-11-25 DIAGNOSIS — E11621 Type 2 diabetes mellitus with foot ulcer: Secondary | ICD-10-CM | POA: Diagnosis not present

## 2016-11-25 DIAGNOSIS — N183 Chronic kidney disease, stage 3 (moderate): Secondary | ICD-10-CM | POA: Diagnosis not present

## 2016-11-25 DIAGNOSIS — I129 Hypertensive chronic kidney disease with stage 1 through stage 4 chronic kidney disease, or unspecified chronic kidney disease: Secondary | ICD-10-CM | POA: Diagnosis not present

## 2016-11-27 DIAGNOSIS — I129 Hypertensive chronic kidney disease with stage 1 through stage 4 chronic kidney disease, or unspecified chronic kidney disease: Secondary | ICD-10-CM | POA: Diagnosis not present

## 2016-11-27 DIAGNOSIS — E1142 Type 2 diabetes mellitus with diabetic polyneuropathy: Secondary | ICD-10-CM | POA: Diagnosis not present

## 2016-11-27 DIAGNOSIS — E11621 Type 2 diabetes mellitus with foot ulcer: Secondary | ICD-10-CM | POA: Diagnosis not present

## 2016-11-27 DIAGNOSIS — N183 Chronic kidney disease, stage 3 (moderate): Secondary | ICD-10-CM | POA: Diagnosis not present

## 2016-11-27 DIAGNOSIS — Z4800 Encounter for change or removal of nonsurgical wound dressing: Secondary | ICD-10-CM | POA: Diagnosis not present

## 2016-11-27 DIAGNOSIS — L97522 Non-pressure chronic ulcer of other part of left foot with fat layer exposed: Secondary | ICD-10-CM | POA: Diagnosis not present

## 2016-11-27 DIAGNOSIS — E11311 Type 2 diabetes mellitus with unspecified diabetic retinopathy with macular edema: Secondary | ICD-10-CM | POA: Diagnosis not present

## 2016-11-27 DIAGNOSIS — Z7901 Long term (current) use of anticoagulants: Secondary | ICD-10-CM | POA: Diagnosis not present

## 2016-11-27 DIAGNOSIS — E1122 Type 2 diabetes mellitus with diabetic chronic kidney disease: Secondary | ICD-10-CM | POA: Diagnosis not present

## 2016-11-27 DIAGNOSIS — Z794 Long term (current) use of insulin: Secondary | ICD-10-CM | POA: Diagnosis not present

## 2016-11-27 DIAGNOSIS — Z89422 Acquired absence of other left toe(s): Secondary | ICD-10-CM | POA: Diagnosis not present

## 2016-11-30 DIAGNOSIS — L97522 Non-pressure chronic ulcer of other part of left foot with fat layer exposed: Secondary | ICD-10-CM | POA: Diagnosis not present

## 2016-11-30 DIAGNOSIS — E1142 Type 2 diabetes mellitus with diabetic polyneuropathy: Secondary | ICD-10-CM | POA: Diagnosis not present

## 2016-11-30 DIAGNOSIS — Z7901 Long term (current) use of anticoagulants: Secondary | ICD-10-CM | POA: Diagnosis not present

## 2016-11-30 DIAGNOSIS — E11311 Type 2 diabetes mellitus with unspecified diabetic retinopathy with macular edema: Secondary | ICD-10-CM | POA: Diagnosis not present

## 2016-11-30 DIAGNOSIS — Z4800 Encounter for change or removal of nonsurgical wound dressing: Secondary | ICD-10-CM | POA: Diagnosis not present

## 2016-11-30 DIAGNOSIS — E1122 Type 2 diabetes mellitus with diabetic chronic kidney disease: Secondary | ICD-10-CM | POA: Diagnosis not present

## 2016-11-30 DIAGNOSIS — I129 Hypertensive chronic kidney disease with stage 1 through stage 4 chronic kidney disease, or unspecified chronic kidney disease: Secondary | ICD-10-CM | POA: Diagnosis not present

## 2016-11-30 DIAGNOSIS — N183 Chronic kidney disease, stage 3 (moderate): Secondary | ICD-10-CM | POA: Diagnosis not present

## 2016-11-30 DIAGNOSIS — Z89422 Acquired absence of other left toe(s): Secondary | ICD-10-CM | POA: Diagnosis not present

## 2016-11-30 DIAGNOSIS — E11621 Type 2 diabetes mellitus with foot ulcer: Secondary | ICD-10-CM | POA: Diagnosis not present

## 2016-11-30 DIAGNOSIS — Z794 Long term (current) use of insulin: Secondary | ICD-10-CM | POA: Diagnosis not present

## 2016-12-01 ENCOUNTER — Other Ambulatory Visit: Payer: Self-pay | Admitting: Endocrinology

## 2016-12-01 ENCOUNTER — Encounter: Payer: Self-pay | Admitting: Sports Medicine

## 2016-12-01 ENCOUNTER — Ambulatory Visit (INDEPENDENT_AMBULATORY_CARE_PROVIDER_SITE_OTHER): Payer: Medicare Other | Admitting: Sports Medicine

## 2016-12-01 VITALS — Temp 97.6°F

## 2016-12-01 DIAGNOSIS — L97522 Non-pressure chronic ulcer of other part of left foot with fat layer exposed: Secondary | ICD-10-CM

## 2016-12-01 DIAGNOSIS — IMO0002 Reserved for concepts with insufficient information to code with codable children: Secondary | ICD-10-CM

## 2016-12-01 DIAGNOSIS — Z89432 Acquired absence of left foot: Secondary | ICD-10-CM

## 2016-12-01 DIAGNOSIS — E0842 Diabetes mellitus due to underlying condition with diabetic polyneuropathy: Secondary | ICD-10-CM

## 2016-12-01 NOTE — Progress Notes (Signed)
Subjective: [name] is a 66 y.o. male patient seen today in office for POV #21 (DOS 06-15-16), S/P Left wound debridement and placement of stravix allograft. Patient had grafix #9 placed last visit, denies pain at surgical site, denies calf pain, denies headache, chest pain, shortness of breath, nausea, vomiting, fever, or chills. Patient states that he is doing well with no issues. Has home nursing every other day. No other issues noted.   Patient Active Problem List   Diagnosis Date Noted  . Chronic pain syndrome 09/18/2015  . Preventative health care 03/12/2015  . Family history of coronary artery disease in father 12/14/2014  . Acute osteomyelitis of toe of left foot ([place])   . Foot ulcer, left ([place]) 12/13/2014  . Paroxysmal atrial fibrillation ([place]) 12/13/2014  . Elevated alkaline phosphatase level 06/11/2014  . Constipation 05/31/2014  . Vitamin D deficiency 11/17/2013  . Anemia 02/10/2012  . Hypertension 11/18/2011  . Wheezing 06/08/2011  . Obstructive sleep apnea 12/12/2009  . GERD 06/27/2008  . Diabetes type 2, uncontrolled ([place]) 12/28/2007  . DIABETIC MACULAR EDEMA 10/07/2007  . Dyslipidemia 08/15/2007  . BACKGROUND DIABETIC RETINOPATHY 08/15/2007  . ERECTILE DYSFUNCTION 05/31/2007  . HEARING LOSS, SENSORINEURAL, BILATERAL 01/03/2007  . Diabetic peripheral neuropathy associated with type 2 diabetes mellitus ([place]) 12/16/2006  . Chronic kidney disease, stage III (moderate) 12/01/2006  . Morbid obesity ([place]) 08/23/2006  . STATUS, OTHER TOE(S) AMPUTATION 05/21/2006    Current Outpatient Prescriptions on File Prior to Visit  Medication Sig Dispense Refill  . ACCU-CHEK FASTCLIX LANCETS MISC Check blood sugar 5 times a day 408 each 3  . atorvastatin (LIPITOR) 20 MG tablet TAKE 1 TABLET(20 MG) BY MOUTH DAILY 90 tablet 3  . BESIVANCE 0.6 % SUSP Place 1 drop into both eyes 4 (four) times daily.   12  . Blood Glucose Monitoring Suppl (ACCU-CHEK AVIVA PLUS) w/Device KIT  Check blood sugar five times a day 1 kit 0  . Cholecalciferol (VITAMIN D3) 2000 UNITS capsule Take 1 capsule (2,000 Units total) by mouth daily. 90 capsule 0  . diclofenac sodium (VOLTAREN) 1 % GEL Apply 4 g topically 4 (four) times daily. 100 g 2  . docusate sodium (COLACE) 100 MG capsule Take 1 capsule (100 mg total) by mouth daily as needed for mild constipation. 14 capsule 0  . ELIQUIS 5 MG TABS tablet TAKE 1 TABLET(5 MG) BY MOUTH TWICE DAILY 180 tablet 3  . enalapril (VASOTEC) 20 MG tablet TAKE 2 TABLETS(40 MG) BY MOUTH DAILY 180 tablet 1  . furosemide (LASIX) 40 MG tablet TAKE 1 TABLET(40 MG) BY MOUTH TWICE DAILY 60 tablet 3  . gabapentin (NEURONTIN) 300 MG capsule Take 1 capsule (300 mg total) by mouth at bedtime. 30 capsule 3  . glucose blood (ACCU-CHEK AVIVA PLUS) test strip USE TO CHECK BLOOD Five TIMES DAILY 450 each 3  . Insulin Glargine (TOUJEO SOLOSTAR) 300 UNIT/ML SOPN Inject 60-90 Units into the skin 2 (two) times daily. 45 pen 2  . Insulin Pen Needle 31G X 5 MM MISC Use for insulin injection 5 times a day. 150 each 5  . insulin regular human CONCENTRATED (HUMULIN R U-500 KWIKPEN) 500 UNIT/ML kwikpen INJECT 35 UNITS AT BREAKFAST, 60 UNITS AT LUNCH, AND 70 UNITS AT DINNER 20 mL 2  . LUMIGAN 0.01 % SOLN Place 1 drop into both eyes at bedtime.     . metoprolol succinate (TOPROL-XL) 25 MG 24 hr tablet TAKE 1 TABLET(25 MG) BY MOUTH DAILY 90 tablet 2  .  omeprazole (PRILOSEC) 20 MG capsule TAKE 1 CAPSULE(20 MG) BY MOUTH DAILY 90 capsule 0  . PROAIR HFA 108 (90 Base) MCG/ACT inhaler INHALE 2 PUFFS INTO THE LUNGS BY MOUTH EVERY 6 HOURS AS NEEDED FOR WHEEZING OR SHORTNESS OF BREATH 8.5 g 6  . promethazine (PHENERGAN) 25 MG tablet Take 1 tablet (25 mg total) by mouth every 8 (eight) hours as needed for nausea or vomiting. 30 tablet 2  . silver sulfADIAZINE (SILVADENE) 1 % cream Apply 1 application topically daily. 50 g 1  . SIMBRINZA 1-0.2 % SUSP Place 1 drop into both eyes 2 (two) times  daily. Take as instructed by Dr. Katy Fitch.    Nelva Nay SOLOSTAR 300 UNIT/ML SOPN INJECT 74 UNITS INTO SKIN TWICE DAILY 9 pen 0  . HYDROcodone-acetaminophen (NORCO) 7.5-325 MG tablet Take 1 tablet by mouth every 6 (six) hours as needed for moderate pain. 130 tablet 0   No current facility-administered medications on file prior to visit.     Allergies  Allergen Reactions  . Vancomycin     REACTION: ARF    Objective: There were no vitals filed for this visit.  General: No acute distress, AAOx3  Left foot: Plantar forefoot, wound graft fully incorporated with granular wound bed measuring 2.0x1.1x0.2cm (minimally smaller than previous) with mild periwound maceration, decreased swelling to left foot, no erythema, no warmth, no active drainage however on inner guaze there is drainage, decreased since last week with no acute signs of infection noted, Capillary fill time <3 seconds in all digits remaining, amputation status of 2nd and 3rd toes. No pain with calf compression.   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[place])    -  Primary   Diabetes mellitus due to underlying condition with diabetic polyneuropathy, unspecified whether long term insulin use ([place])       Foot amputation status, left ([place])           -Patient seen and evaluated -Cleansed wound debirded wound margins excisionlly to healthy bleeding margins and applied Grafix core 3x4cm (graft #10 to site) in total to wound bed, lot no [id number], unit no [id number] exp March 16,2021, part no [id number] without waste, secured with adaptic and steristrips all covered with hydrofore blue and dry dressing   -Advised patient to make sure to keep dressings clean, dry, and intact allowing nursing to change absorbant dressing and outer gauze dressings only; do not touch steristrips or adaptic -Advised patient to continue with forefoot offloading post-op shoe on  left foot with use of cane -Advised patient to limit activity to necessity  -Advised patient to ice and elevate as necessary  -Will plan for evalu of wound bed at next visit. May consider switching to regranex for a few weeks to encourage closure. In the meantime, patient to call office if any issues or problems arise.   Landis Martins, DPM

## 2016-12-02 DIAGNOSIS — Z794 Long term (current) use of insulin: Secondary | ICD-10-CM | POA: Diagnosis not present

## 2016-12-02 DIAGNOSIS — E11621 Type 2 diabetes mellitus with foot ulcer: Secondary | ICD-10-CM | POA: Diagnosis not present

## 2016-12-02 DIAGNOSIS — E1142 Type 2 diabetes mellitus with diabetic polyneuropathy: Secondary | ICD-10-CM | POA: Diagnosis not present

## 2016-12-02 DIAGNOSIS — Z4800 Encounter for change or removal of nonsurgical wound dressing: Secondary | ICD-10-CM | POA: Diagnosis not present

## 2016-12-02 DIAGNOSIS — Z89422 Acquired absence of other left toe(s): Secondary | ICD-10-CM | POA: Diagnosis not present

## 2016-12-02 DIAGNOSIS — N183 Chronic kidney disease, stage 3 (moderate): Secondary | ICD-10-CM | POA: Diagnosis not present

## 2016-12-02 DIAGNOSIS — I129 Hypertensive chronic kidney disease with stage 1 through stage 4 chronic kidney disease, or unspecified chronic kidney disease: Secondary | ICD-10-CM | POA: Diagnosis not present

## 2016-12-02 DIAGNOSIS — L97522 Non-pressure chronic ulcer of other part of left foot with fat layer exposed: Secondary | ICD-10-CM | POA: Diagnosis not present

## 2016-12-02 DIAGNOSIS — E1122 Type 2 diabetes mellitus with diabetic chronic kidney disease: Secondary | ICD-10-CM | POA: Diagnosis not present

## 2016-12-02 DIAGNOSIS — Z7901 Long term (current) use of anticoagulants: Secondary | ICD-10-CM | POA: Diagnosis not present

## 2016-12-02 DIAGNOSIS — E11311 Type 2 diabetes mellitus with unspecified diabetic retinopathy with macular edema: Secondary | ICD-10-CM | POA: Diagnosis not present

## 2016-12-04 DIAGNOSIS — Z89422 Acquired absence of other left toe(s): Secondary | ICD-10-CM | POA: Diagnosis not present

## 2016-12-04 DIAGNOSIS — L97522 Non-pressure chronic ulcer of other part of left foot with fat layer exposed: Secondary | ICD-10-CM | POA: Diagnosis not present

## 2016-12-04 DIAGNOSIS — I129 Hypertensive chronic kidney disease with stage 1 through stage 4 chronic kidney disease, or unspecified chronic kidney disease: Secondary | ICD-10-CM | POA: Diagnosis not present

## 2016-12-04 DIAGNOSIS — Z7901 Long term (current) use of anticoagulants: Secondary | ICD-10-CM | POA: Diagnosis not present

## 2016-12-04 DIAGNOSIS — Z4800 Encounter for change or removal of nonsurgical wound dressing: Secondary | ICD-10-CM | POA: Diagnosis not present

## 2016-12-04 DIAGNOSIS — E11621 Type 2 diabetes mellitus with foot ulcer: Secondary | ICD-10-CM | POA: Diagnosis not present

## 2016-12-04 DIAGNOSIS — E1142 Type 2 diabetes mellitus with diabetic polyneuropathy: Secondary | ICD-10-CM | POA: Diagnosis not present

## 2016-12-04 DIAGNOSIS — Z794 Long term (current) use of insulin: Secondary | ICD-10-CM | POA: Diagnosis not present

## 2016-12-04 DIAGNOSIS — E1122 Type 2 diabetes mellitus with diabetic chronic kidney disease: Secondary | ICD-10-CM | POA: Diagnosis not present

## 2016-12-04 DIAGNOSIS — E11311 Type 2 diabetes mellitus with unspecified diabetic retinopathy with macular edema: Secondary | ICD-10-CM | POA: Diagnosis not present

## 2016-12-04 DIAGNOSIS — N183 Chronic kidney disease, stage 3 (moderate): Secondary | ICD-10-CM | POA: Diagnosis not present

## 2016-12-08 ENCOUNTER — Ambulatory Visit (INDEPENDENT_AMBULATORY_CARE_PROVIDER_SITE_OTHER): Payer: Medicare Other | Admitting: Sports Medicine

## 2016-12-08 ENCOUNTER — Telehealth: Payer: Self-pay | Admitting: *Deleted

## 2016-12-08 ENCOUNTER — Encounter: Payer: Self-pay | Admitting: Sports Medicine

## 2016-12-08 VITALS — BP 131/70 | HR 62 | Resp 16

## 2016-12-08 DIAGNOSIS — Z89432 Acquired absence of left foot: Secondary | ICD-10-CM | POA: Diagnosis not present

## 2016-12-08 DIAGNOSIS — L97522 Non-pressure chronic ulcer of other part of left foot with fat layer exposed: Secondary | ICD-10-CM | POA: Diagnosis not present

## 2016-12-08 DIAGNOSIS — E0842 Diabetes mellitus due to underlying condition with diabetic polyneuropathy: Secondary | ICD-10-CM

## 2016-12-08 DIAGNOSIS — IMO0002 Reserved for concepts with insufficient information to code with codable children: Secondary | ICD-10-CM

## 2016-12-08 DIAGNOSIS — G4733 Obstructive sleep apnea (adult) (pediatric): Secondary | ICD-10-CM | POA: Diagnosis not present

## 2016-12-08 NOTE — Progress Notes (Signed)
Subjective: [name] is a 66 y.o. male patient seen today in office for POV #22 (DOS 06-15-16), S/P Left wound debridement and placement of stravix allograft. Patient had grafix #10 placed last visit, denies pain at surgical site, denies calf pain, denies headache, chest pain, shortness of breath, nausea, vomiting, fever, or chills. Patient states that he is doing well with no issues. Has home nursing every other day; reports no issues with dressings. No other issues noted.   Patient Active Problem List   Diagnosis Date Noted  . Chronic pain syndrome 09/18/2015  . Preventative health care 03/12/2015  . Family history of coronary artery disease in father 12/14/2014  . Acute osteomyelitis of toe of left foot ([place])   . Foot ulcer, left ([place]) 12/13/2014  . Paroxysmal atrial fibrillation ([place]) 12/13/2014  . Elevated alkaline phosphatase level 06/11/2014  . Constipation 05/31/2014  . Vitamin D deficiency 11/17/2013  . Anemia 02/10/2012  . Hypertension 11/18/2011  . Wheezing 06/08/2011  . Obstructive sleep apnea 12/12/2009  . GERD 06/27/2008  . Diabetes type 2, uncontrolled ([place]) 12/28/2007  . DIABETIC MACULAR EDEMA 10/07/2007  . Dyslipidemia 08/15/2007  . BACKGROUND DIABETIC RETINOPATHY 08/15/2007  . ERECTILE DYSFUNCTION 05/31/2007  . HEARING LOSS, SENSORINEURAL, BILATERAL 01/03/2007  . Diabetic peripheral neuropathy associated with type 2 diabetes mellitus ([place]) 12/16/2006  . Chronic kidney disease, stage III (moderate) 12/01/2006  . Morbid obesity ([place]) 08/23/2006  . STATUS, OTHER TOE(S) AMPUTATION 05/21/2006    Current Outpatient Prescriptions on File Prior to Visit  Medication Sig Dispense Refill  . ACCU-CHEK FASTCLIX LANCETS MISC Check blood sugar 5 times a day 408 each 3  . atorvastatin (LIPITOR) 20 MG tablet TAKE 1 TABLET(20 MG) BY MOUTH DAILY 90 tablet 3  . BESIVANCE 0.6 % SUSP Place 1 drop into both eyes 4 (four) times daily.   12  . Blood Glucose Monitoring Suppl  (ACCU-CHEK AVIVA PLUS) w/Device KIT Check blood sugar five times a day 1 kit 0  . Cholecalciferol (VITAMIN D3) 2000 UNITS capsule Take 1 capsule (2,000 Units total) by mouth daily. 90 capsule 0  . diclofenac sodium (VOLTAREN) 1 % GEL Apply 4 g topically 4 (four) times daily. 100 g 2  . docusate sodium (COLACE) 100 MG capsule Take 1 capsule (100 mg total) by mouth daily as needed for mild constipation. 14 capsule 0  . ELIQUIS 5 MG TABS tablet TAKE 1 TABLET(5 MG) BY MOUTH TWICE DAILY 180 tablet 3  . enalapril (VASOTEC) 20 MG tablet TAKE 2 TABLETS(40 MG) BY MOUTH DAILY 180 tablet 1  . furosemide (LASIX) 40 MG tablet TAKE 1 TABLET(40 MG) BY MOUTH TWICE DAILY 60 tablet 3  . gabapentin (NEURONTIN) 300 MG capsule Take 1 capsule (300 mg total) by mouth at bedtime. 30 capsule 3  . glucose blood (ACCU-CHEK AVIVA PLUS) test strip USE TO CHECK BLOOD Five TIMES DAILY 450 each 3  . Insulin Glargine (TOUJEO SOLOSTAR) 300 UNIT/ML SOPN Inject 60-90 Units into the skin 2 (two) times daily. 45 pen 2  . Insulin Pen Needle 31G X 5 MM MISC Use for insulin injection 5 times a day. 150 each 5  . insulin regular human CONCENTRATED (HUMULIN R U-500 KWIKPEN) 500 UNIT/ML kwikpen INJECT 35 UNITS AT BREAKFAST, 60 UNITS AT LUNCH, AND 70 UNITS AT DINNER 20 mL 2  . LUMIGAN 0.01 % SOLN Place 1 drop into both eyes at bedtime.     . metoprolol succinate (TOPROL-XL) 25 MG 24 hr tablet TAKE 1 TABLET(25 MG) BY  MOUTH DAILY 90 tablet 2  . omeprazole (PRILOSEC) 20 MG capsule TAKE 1 CAPSULE(20 MG) BY MOUTH DAILY 90 capsule 0  . PROAIR HFA 108 (90 Base) MCG/ACT inhaler INHALE 2 PUFFS INTO THE LUNGS BY MOUTH EVERY 6 HOURS AS NEEDED FOR WHEEZING OR SHORTNESS OF BREATH 8.5 g 6  . promethazine (PHENERGAN) 25 MG tablet Take 1 tablet (25 mg total) by mouth every 8 (eight) hours as needed for nausea or vomiting. 30 tablet 2  . silver sulfADIAZINE (SILVADENE) 1 % cream Apply 1 application topically daily. 50 g 1  . SIMBRINZA 1-0.2 % SUSP Place 1  drop into both eyes 2 (two) times daily. Take as instructed by Dr. Katy Fitch.    Nelva Nay SOLOSTAR 300 UNIT/ML SOPN INJECT 74 UNITS INTO SKIN TWICE DAILY 9 pen 0  . VICTOZA 18 MG/3ML SOPN ADMINISTER 1.8 MG UNDER THE SKIN AT THE SAME TIME EVERY DAY 9 mL 0  . HYDROcodone-acetaminophen (NORCO) 7.5-325 MG tablet Take 1 tablet by mouth every 6 (six) hours as needed for moderate pain. 130 tablet 0   No current facility-administered medications on file prior to visit.     Allergies  Allergen Reactions  . Vancomycin     REACTION: ARF    Objective: There were no vitals filed for this visit.  General: No acute distress, AAOx3  Left foot: Plantar forefoot, wound graft fully incorporated with granular wound bed measuring 1.8x1.0x0.2cm (smaller than previous) with mild periwound maceration, decreased swelling to left foot, no erythema, no warmth, no active drainage however on inner guaze there is drainage, decreased since last week with no acute signs of infection noted, Capillary fill time <3 seconds in all digits remaining, amputation status of 2nd and 3rd toes. No pain with calf compression.   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[place])    -  Primary   Diabetes mellitus due to underlying condition with diabetic polyneuropathy, unspecified whether long term insulin use ([place])       Foot amputation status, left ([place])           -Patient seen and evaluated -Cleansed wound debirded wound margins excisionlly to healthy bleeding margins and applied hydrofore blue and dry dressing   -Advised patient to make sure to keep dressings clean, dry, and intact allowing nursing to change absorbant blue dressing until regranex can be received -Advised patient to continue with forefoot offloading post-op shoe on left foot with use of cane -Advised patient to limit activity to necessity  -Advised patient to ice and  elevate as necessary  -Will plan for evalution of wound bed at next visit. In the meantime, patient to call office if any issues or problems arise.   Landis Martins, DPM

## 2016-12-08 NOTE — Telephone Encounter (Addendum)
-----   Message from Landis Martins, [place] sent at 12/08/2016 10:23 AM EDT ----- Regarding: Regranex and home nursing  Home nursing Continue to use hydrofera blue pad to ulceration until regranex is received all covered with dry dressing  Order regranex for left foot ulcer measures 1.5x1x0.2cm today  Thanks Dr. Cannon Kettle. Copy of Dr. Leeanne Rio 12/08/2016 10:23am orders faxed to [facility].12/09/2016-Faxed required form, clinicals and demographics to [facility].12/10/2016-Pt called states he needs something for home health care dressing. I spoke with pt and he states his [place] needs orders for coban.  Faxed order for coban to cover left foot dressing, to [facility]. 12/22/2016-Faxed required form, demographics and clinicals for pre-cert to [facility].

## 2016-12-09 DIAGNOSIS — L97522 Non-pressure chronic ulcer of other part of left foot with fat layer exposed: Secondary | ICD-10-CM | POA: Diagnosis not present

## 2016-12-09 DIAGNOSIS — E11311 Type 2 diabetes mellitus with unspecified diabetic retinopathy with macular edema: Secondary | ICD-10-CM | POA: Diagnosis not present

## 2016-12-09 DIAGNOSIS — Z7901 Long term (current) use of anticoagulants: Secondary | ICD-10-CM | POA: Diagnosis not present

## 2016-12-09 DIAGNOSIS — I129 Hypertensive chronic kidney disease with stage 1 through stage 4 chronic kidney disease, or unspecified chronic kidney disease: Secondary | ICD-10-CM | POA: Diagnosis not present

## 2016-12-09 DIAGNOSIS — Z794 Long term (current) use of insulin: Secondary | ICD-10-CM | POA: Diagnosis not present

## 2016-12-09 DIAGNOSIS — E11621 Type 2 diabetes mellitus with foot ulcer: Secondary | ICD-10-CM | POA: Diagnosis not present

## 2016-12-09 DIAGNOSIS — E1142 Type 2 diabetes mellitus with diabetic polyneuropathy: Secondary | ICD-10-CM | POA: Diagnosis not present

## 2016-12-09 DIAGNOSIS — E1122 Type 2 diabetes mellitus with diabetic chronic kidney disease: Secondary | ICD-10-CM | POA: Diagnosis not present

## 2016-12-09 DIAGNOSIS — Z89422 Acquired absence of other left toe(s): Secondary | ICD-10-CM | POA: Diagnosis not present

## 2016-12-09 DIAGNOSIS — Z4800 Encounter for change or removal of nonsurgical wound dressing: Secondary | ICD-10-CM | POA: Diagnosis not present

## 2016-12-09 DIAGNOSIS — N183 Chronic kidney disease, stage 3 (moderate): Secondary | ICD-10-CM | POA: Diagnosis not present

## 2016-12-09 MED ORDER — BECAPLERMIN 0.01 % EX GEL: 1.0000 "application " | Freq: Every day | CUTANEOUS | 5 refills | Status: DC

## 2016-12-11 DIAGNOSIS — L97522 Non-pressure chronic ulcer of other part of left foot with fat layer exposed: Secondary | ICD-10-CM | POA: Diagnosis not present

## 2016-12-11 DIAGNOSIS — E11621 Type 2 diabetes mellitus with foot ulcer: Secondary | ICD-10-CM | POA: Diagnosis not present

## 2016-12-11 DIAGNOSIS — Z794 Long term (current) use of insulin: Secondary | ICD-10-CM | POA: Diagnosis not present

## 2016-12-11 DIAGNOSIS — N183 Chronic kidney disease, stage 3 (moderate): Secondary | ICD-10-CM | POA: Diagnosis not present

## 2016-12-11 DIAGNOSIS — Z4800 Encounter for change or removal of nonsurgical wound dressing: Secondary | ICD-10-CM | POA: Diagnosis not present

## 2016-12-11 DIAGNOSIS — I129 Hypertensive chronic kidney disease with stage 1 through stage 4 chronic kidney disease, or unspecified chronic kidney disease: Secondary | ICD-10-CM | POA: Diagnosis not present

## 2016-12-11 DIAGNOSIS — E11311 Type 2 diabetes mellitus with unspecified diabetic retinopathy with macular edema: Secondary | ICD-10-CM | POA: Diagnosis not present

## 2016-12-11 DIAGNOSIS — E1122 Type 2 diabetes mellitus with diabetic chronic kidney disease: Secondary | ICD-10-CM | POA: Diagnosis not present

## 2016-12-11 DIAGNOSIS — E1142 Type 2 diabetes mellitus with diabetic polyneuropathy: Secondary | ICD-10-CM | POA: Diagnosis not present

## 2016-12-11 DIAGNOSIS — Z89422 Acquired absence of other left toe(s): Secondary | ICD-10-CM | POA: Diagnosis not present

## 2016-12-11 DIAGNOSIS — Z7901 Long term (current) use of anticoagulants: Secondary | ICD-10-CM | POA: Diagnosis not present

## 2016-12-14 DIAGNOSIS — E11621 Type 2 diabetes mellitus with foot ulcer: Secondary | ICD-10-CM | POA: Diagnosis not present

## 2016-12-14 DIAGNOSIS — Z4800 Encounter for change or removal of nonsurgical wound dressing: Secondary | ICD-10-CM | POA: Diagnosis not present

## 2016-12-14 DIAGNOSIS — E11311 Type 2 diabetes mellitus with unspecified diabetic retinopathy with macular edema: Secondary | ICD-10-CM | POA: Diagnosis not present

## 2016-12-14 DIAGNOSIS — E1142 Type 2 diabetes mellitus with diabetic polyneuropathy: Secondary | ICD-10-CM | POA: Diagnosis not present

## 2016-12-14 DIAGNOSIS — I129 Hypertensive chronic kidney disease with stage 1 through stage 4 chronic kidney disease, or unspecified chronic kidney disease: Secondary | ICD-10-CM | POA: Diagnosis not present

## 2016-12-14 DIAGNOSIS — Z7901 Long term (current) use of anticoagulants: Secondary | ICD-10-CM | POA: Diagnosis not present

## 2016-12-14 DIAGNOSIS — L97522 Non-pressure chronic ulcer of other part of left foot with fat layer exposed: Secondary | ICD-10-CM | POA: Diagnosis not present

## 2016-12-14 DIAGNOSIS — Z89422 Acquired absence of other left toe(s): Secondary | ICD-10-CM | POA: Diagnosis not present

## 2016-12-14 DIAGNOSIS — N183 Chronic kidney disease, stage 3 (moderate): Secondary | ICD-10-CM | POA: Diagnosis not present

## 2016-12-14 DIAGNOSIS — Z794 Long term (current) use of insulin: Secondary | ICD-10-CM | POA: Diagnosis not present

## 2016-12-14 DIAGNOSIS — E1122 Type 2 diabetes mellitus with diabetic chronic kidney disease: Secondary | ICD-10-CM | POA: Diagnosis not present

## 2016-12-15 ENCOUNTER — Ambulatory Visit: Payer: Medicare Other | Admitting: Sports Medicine

## 2016-12-15 ENCOUNTER — Ambulatory Visit: Payer: Medicare Other | Admitting: Internal Medicine

## 2016-12-15 DIAGNOSIS — E113311 Type 2 diabetes mellitus with moderate nonproliferative diabetic retinopathy with macular edema, right eye: Secondary | ICD-10-CM | POA: Diagnosis not present

## 2016-12-15 DIAGNOSIS — E113512 Type 2 diabetes mellitus with proliferative diabetic retinopathy with macular edema, left eye: Secondary | ICD-10-CM | POA: Diagnosis not present

## 2016-12-15 DIAGNOSIS — H43822 Vitreomacular adhesion, left eye: Secondary | ICD-10-CM | POA: Diagnosis not present

## 2016-12-15 DIAGNOSIS — G4733 Obstructive sleep apnea (adult) (pediatric): Secondary | ICD-10-CM | POA: Diagnosis not present

## 2016-12-15 DIAGNOSIS — H31093 Other chorioretinal scars, bilateral: Secondary | ICD-10-CM | POA: Diagnosis not present

## 2016-12-16 DIAGNOSIS — E1122 Type 2 diabetes mellitus with diabetic chronic kidney disease: Secondary | ICD-10-CM | POA: Diagnosis not present

## 2016-12-16 DIAGNOSIS — N183 Chronic kidney disease, stage 3 (moderate): Secondary | ICD-10-CM | POA: Diagnosis not present

## 2016-12-16 DIAGNOSIS — E11621 Type 2 diabetes mellitus with foot ulcer: Secondary | ICD-10-CM | POA: Diagnosis not present

## 2016-12-16 DIAGNOSIS — I129 Hypertensive chronic kidney disease with stage 1 through stage 4 chronic kidney disease, or unspecified chronic kidney disease: Secondary | ICD-10-CM | POA: Diagnosis not present

## 2016-12-16 DIAGNOSIS — E1142 Type 2 diabetes mellitus with diabetic polyneuropathy: Secondary | ICD-10-CM | POA: Diagnosis not present

## 2016-12-16 DIAGNOSIS — Z89422 Acquired absence of other left toe(s): Secondary | ICD-10-CM | POA: Diagnosis not present

## 2016-12-16 DIAGNOSIS — L97522 Non-pressure chronic ulcer of other part of left foot with fat layer exposed: Secondary | ICD-10-CM | POA: Diagnosis not present

## 2016-12-16 DIAGNOSIS — Z4800 Encounter for change or removal of nonsurgical wound dressing: Secondary | ICD-10-CM | POA: Diagnosis not present

## 2016-12-16 DIAGNOSIS — Z7901 Long term (current) use of anticoagulants: Secondary | ICD-10-CM | POA: Diagnosis not present

## 2016-12-16 DIAGNOSIS — E11311 Type 2 diabetes mellitus with unspecified diabetic retinopathy with macular edema: Secondary | ICD-10-CM | POA: Diagnosis not present

## 2016-12-16 DIAGNOSIS — Z794 Long term (current) use of insulin: Secondary | ICD-10-CM | POA: Diagnosis not present

## 2016-12-18 DIAGNOSIS — E11621 Type 2 diabetes mellitus with foot ulcer: Secondary | ICD-10-CM | POA: Diagnosis not present

## 2016-12-18 DIAGNOSIS — Z794 Long term (current) use of insulin: Secondary | ICD-10-CM | POA: Diagnosis not present

## 2016-12-18 DIAGNOSIS — Z7901 Long term (current) use of anticoagulants: Secondary | ICD-10-CM | POA: Diagnosis not present

## 2016-12-18 DIAGNOSIS — E1142 Type 2 diabetes mellitus with diabetic polyneuropathy: Secondary | ICD-10-CM | POA: Diagnosis not present

## 2016-12-18 DIAGNOSIS — Z4800 Encounter for change or removal of nonsurgical wound dressing: Secondary | ICD-10-CM | POA: Diagnosis not present

## 2016-12-18 DIAGNOSIS — N183 Chronic kidney disease, stage 3 (moderate): Secondary | ICD-10-CM | POA: Diagnosis not present

## 2016-12-18 DIAGNOSIS — L97522 Non-pressure chronic ulcer of other part of left foot with fat layer exposed: Secondary | ICD-10-CM | POA: Diagnosis not present

## 2016-12-18 DIAGNOSIS — E11311 Type 2 diabetes mellitus with unspecified diabetic retinopathy with macular edema: Secondary | ICD-10-CM | POA: Diagnosis not present

## 2016-12-18 DIAGNOSIS — I129 Hypertensive chronic kidney disease with stage 1 through stage 4 chronic kidney disease, or unspecified chronic kidney disease: Secondary | ICD-10-CM | POA: Diagnosis not present

## 2016-12-18 DIAGNOSIS — Z89422 Acquired absence of other left toe(s): Secondary | ICD-10-CM | POA: Diagnosis not present

## 2016-12-18 DIAGNOSIS — E1122 Type 2 diabetes mellitus with diabetic chronic kidney disease: Secondary | ICD-10-CM | POA: Diagnosis not present

## 2016-12-21 DIAGNOSIS — Z89422 Acquired absence of other left toe(s): Secondary | ICD-10-CM | POA: Diagnosis not present

## 2016-12-21 DIAGNOSIS — Z4800 Encounter for change or removal of nonsurgical wound dressing: Secondary | ICD-10-CM | POA: Diagnosis not present

## 2016-12-21 DIAGNOSIS — L97522 Non-pressure chronic ulcer of other part of left foot with fat layer exposed: Secondary | ICD-10-CM | POA: Diagnosis not present

## 2016-12-21 DIAGNOSIS — N183 Chronic kidney disease, stage 3 (moderate): Secondary | ICD-10-CM | POA: Diagnosis not present

## 2016-12-21 DIAGNOSIS — Z7901 Long term (current) use of anticoagulants: Secondary | ICD-10-CM | POA: Diagnosis not present

## 2016-12-21 DIAGNOSIS — Z794 Long term (current) use of insulin: Secondary | ICD-10-CM | POA: Diagnosis not present

## 2016-12-21 DIAGNOSIS — E1122 Type 2 diabetes mellitus with diabetic chronic kidney disease: Secondary | ICD-10-CM | POA: Diagnosis not present

## 2016-12-21 DIAGNOSIS — E11621 Type 2 diabetes mellitus with foot ulcer: Secondary | ICD-10-CM | POA: Diagnosis not present

## 2016-12-21 DIAGNOSIS — E1142 Type 2 diabetes mellitus with diabetic polyneuropathy: Secondary | ICD-10-CM | POA: Diagnosis not present

## 2016-12-21 DIAGNOSIS — I129 Hypertensive chronic kidney disease with stage 1 through stage 4 chronic kidney disease, or unspecified chronic kidney disease: Secondary | ICD-10-CM | POA: Diagnosis not present

## 2016-12-21 DIAGNOSIS — E11311 Type 2 diabetes mellitus with unspecified diabetic retinopathy with macular edema: Secondary | ICD-10-CM | POA: Diagnosis not present

## 2016-12-22 ENCOUNTER — Ambulatory Visit (INDEPENDENT_AMBULATORY_CARE_PROVIDER_SITE_OTHER): Payer: Medicare Other | Admitting: Sports Medicine

## 2016-12-22 ENCOUNTER — Encounter: Payer: Self-pay | Admitting: Sports Medicine

## 2016-12-22 ENCOUNTER — Ambulatory Visit: Payer: Medicare Other | Admitting: Sports Medicine

## 2016-12-22 DIAGNOSIS — E0842 Diabetes mellitus due to underlying condition with diabetic polyneuropathy: Secondary | ICD-10-CM

## 2016-12-22 DIAGNOSIS — L97522 Non-pressure chronic ulcer of other part of left foot with fat layer exposed: Secondary | ICD-10-CM

## 2016-12-22 DIAGNOSIS — G4733 Obstructive sleep apnea (adult) (pediatric): Secondary | ICD-10-CM | POA: Diagnosis not present

## 2016-12-22 DIAGNOSIS — Z89432 Acquired absence of left foot: Secondary | ICD-10-CM

## 2016-12-22 DIAGNOSIS — IMO0002 Reserved for concepts with insufficient information to code with codable children: Secondary | ICD-10-CM

## 2016-12-22 NOTE — Progress Notes (Signed)
Subjective: [name] is a 66 y.o. male patient seen today in office for POV #23 (DOS 06-15-16), S/P Left wound debridement and placement of stravix allograft. Patient had grafix #10 placed 2 weeks ago and is awaiting approval for regranex, denies pain at surgical site, denies calf pain, denies headache, chest pain, shortness of breath, nausea, vomiting, fever, or chills. Patient states that he is doing well with no issues. Has home nursing every other day; reports no issues with dressings, currently applying hydrofora blue. No other issues noted.   Patient Active Problem List   Diagnosis Date Noted  . Chronic pain syndrome 09/18/2015  . Preventative health care 03/12/2015  . Family history of coronary artery disease in father 12/14/2014  . Acute osteomyelitis of toe of left foot ([place])   . Foot ulcer, left ([place]) 12/13/2014  . Paroxysmal atrial fibrillation ([place]) 12/13/2014  . Elevated alkaline phosphatase level 06/11/2014  . Constipation 05/31/2014  . Vitamin D deficiency 11/17/2013  . Anemia 02/10/2012  . Hypertension 11/18/2011  . Wheezing 06/08/2011  . Obstructive sleep apnea 12/12/2009  . GERD 06/27/2008  . Diabetes type 2, uncontrolled ([place]) 12/28/2007  . DIABETIC MACULAR EDEMA 10/07/2007  . Dyslipidemia 08/15/2007  . BACKGROUND DIABETIC RETINOPATHY 08/15/2007  . ERECTILE DYSFUNCTION 05/31/2007  . HEARING LOSS, SENSORINEURAL, BILATERAL 01/03/2007  . Diabetic peripheral neuropathy associated with type 2 diabetes mellitus ([place]) 12/16/2006  . Chronic kidney disease, stage III (moderate) 12/01/2006  . Morbid obesity ([place]) 08/23/2006  . STATUS, OTHER TOE(S) AMPUTATION 05/21/2006    Current Outpatient Prescriptions on File Prior to Visit  Medication Sig Dispense Refill  . ACCU-CHEK FASTCLIX LANCETS MISC Check blood sugar 5 times a day 408 each 3  . atorvastatin (LIPITOR) 20 MG tablet TAKE 1 TABLET(20 MG) BY MOUTH DAILY 90 tablet 3  . becaplermin (REGRANEX) 0.01 % gel Apply 1  application topically daily. 15 g 5  . BESIVANCE 0.6 % SUSP Place 1 drop into both eyes 4 (four) times daily.   12  . Blood Glucose Monitoring Suppl (ACCU-CHEK AVIVA PLUS) w/Device KIT Check blood sugar five times a day 1 kit 0  . Cholecalciferol (VITAMIN D3) 2000 UNITS capsule Take 1 capsule (2,000 Units total) by mouth daily. 90 capsule 0  . diclofenac sodium (VOLTAREN) 1 % GEL Apply 4 g topically 4 (four) times daily. 100 g 2  . docusate sodium (COLACE) 100 MG capsule Take 1 capsule (100 mg total) by mouth daily as needed for mild constipation. 14 capsule 0  . ELIQUIS 5 MG TABS tablet TAKE 1 TABLET(5 MG) BY MOUTH TWICE DAILY 180 tablet 3  . enalapril (VASOTEC) 20 MG tablet TAKE 2 TABLETS(40 MG) BY MOUTH DAILY 180 tablet 1  . furosemide (LASIX) 40 MG tablet TAKE 1 TABLET(40 MG) BY MOUTH TWICE DAILY 60 tablet 3  . gabapentin (NEURONTIN) 300 MG capsule Take 1 capsule (300 mg total) by mouth at bedtime. 30 capsule 3  . glucose blood (ACCU-CHEK AVIVA PLUS) test strip USE TO CHECK BLOOD Five TIMES DAILY 450 each 3  . HYDROcodone-acetaminophen (NORCO) 7.5-325 MG tablet Take 1 tablet by mouth every 6 (six) hours as needed for moderate pain. 130 tablet 0  . Insulin Glargine (TOUJEO SOLOSTAR) 300 UNIT/ML SOPN Inject 60-90 Units into the skin 2 (two) times daily. 45 pen 2  . Insulin Pen Needle 31G X 5 MM MISC Use for insulin injection 5 times a day. 150 each 5  . insulin regular human CONCENTRATED (HUMULIN R U-500 KWIKPEN) 500 UNIT/ML  kwikpen INJECT 35 UNITS AT BREAKFAST, 60 UNITS AT LUNCH, AND 70 UNITS AT DINNER 20 mL 2  . LUMIGAN 0.01 % SOLN Place 1 drop into both eyes at bedtime.     . metoprolol succinate (TOPROL-XL) 25 MG 24 hr tablet TAKE 1 TABLET(25 MG) BY MOUTH DAILY 90 tablet 2  . omeprazole (PRILOSEC) 20 MG capsule TAKE 1 CAPSULE(20 MG) BY MOUTH DAILY 90 capsule 0  . PROAIR HFA 108 (90 Base) MCG/ACT inhaler INHALE 2 PUFFS INTO THE LUNGS BY MOUTH EVERY 6 HOURS AS NEEDED FOR WHEEZING OR SHORTNESS  OF BREATH 8.5 g 6  . promethazine (PHENERGAN) 25 MG tablet Take 1 tablet (25 mg total) by mouth every 8 (eight) hours as needed for nausea or vomiting. 30 tablet 2  . silver sulfADIAZINE (SILVADENE) 1 % cream Apply 1 application topically daily. 50 g 1  . SIMBRINZA 1-0.2 % SUSP Place 1 drop into both eyes 2 (two) times daily. Take as instructed by Dr. Katy Fitch.    Nelva Nay SOLOSTAR 300 UNIT/ML SOPN INJECT 74 UNITS INTO SKIN TWICE DAILY 9 pen 0  . VICTOZA 18 MG/3ML SOPN ADMINISTER 1.8 MG UNDER THE SKIN AT THE SAME TIME EVERY DAY 9 mL 0   No current facility-administered medications on file prior to visit.     Allergies  Allergen Reactions  . Vancomycin     REACTION: ARF    Objective: There were no vitals filed for this visit.  General: No acute distress, AAOx3  Left foot: Plantar forefoot, wound graft fully incorporated with granular wound bed measuring 1.8x1.0x0.2cm (same as previous) with mild periwound maceration, decreased swelling to left foot, no erythema, no warmth, no active drainage however on inner guaze there is drainage, decreased since last week with no acute signs of infection noted, Capillary fill time <3 seconds in all digits remaining, amputation status of 2nd and 3rd toes. No pain with calf compression.   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[place])    -  Primary   Diabetes mellitus due to underlying condition with diabetic polyneuropathy, unspecified whether long term insulin use ([place])       Foot amputation status, left ([place])           -Patient seen and evaluated -Cleansed wound debirded wound margins excisionlly to healthy bleeding margins and applied hydrofore blue and dry dressing   -Advised patient to make sure to keep dressings clean, dry, and intact allowing nursing to change absorbant blue dressing until regranex can be received -Advised patient to continue with  forefoot offloading post-op shoe on left foot with use of cane -Advised patient to limit activity to necessity  -Advised patient to ice and elevate as necessary  -Will plan for evalution of wound bed once started regranex at next visit. In the meantime, patient to call office if any issues or problems arise.   Landis Martins, DPM

## 2016-12-22 NOTE — Telephone Encounter (Addendum)
-----   Message from Landis Martins, [place] sent at 12/22/2016 11:03 AM EDT ----- Regarding: regranex order Can we follow up on regranex order, patient reports that he has not gotten it. When the patient was called about the medication patient states that the company said they needed more documentation from our office. Please follow up  -Dr. S.12/23/2016-[employer] APPROVED [facility] [place] FOR [place] 05/24/2017 UNDER MEDICARE PART D REFERENCE# [id number]/24/2016-Diana - [facility] called to verify the wound size is it 2 x 2 cm or 1.5 x 1.0 x 0.2cm. I spoke with Felicita Gage - [facility] and he stated pharmacy needs the wound measurement Dr. Cannon Kettle wanted to use and I told him 1.5 x 1.0 x 0.2 cm. I told Felicita Gage that the old form had the 2 x 2 cm box to be checked, which was closest to the actual wound size, so was marked and the new form /only had the area for the actual wound measurements. I told him the new form had been faxed to me yesterday.12/28/2016-Pt called for home health nursing wound care instructions for the new medication he is to be on. I told pt I would fax wound care instructions to [facility]. Orders faxed to [facility] to apply Regranex to left foot ulcer daily after cleansing with warm soapy water and rinsing well. 12/30/2016-Crystal - [facility] asked for dressing instructions to be called to her and faxed to [facility], pt request to begin Regranex today had a doctor appt yesterday. Left message informing Crystal - [facility] of Dr. Leeanne Rio  12/30/2016 12:55pm dressing orders. Faxed Dr. Cannon Kettle 12/30/2016 12:55pm dressing orders to [facility].

## 2016-12-23 ENCOUNTER — Telehealth: Payer: Self-pay | Admitting: *Deleted

## 2016-12-23 DIAGNOSIS — E1142 Type 2 diabetes mellitus with diabetic polyneuropathy: Secondary | ICD-10-CM | POA: Diagnosis not present

## 2016-12-23 DIAGNOSIS — E11311 Type 2 diabetes mellitus with unspecified diabetic retinopathy with macular edema: Secondary | ICD-10-CM | POA: Diagnosis not present

## 2016-12-23 DIAGNOSIS — E11621 Type 2 diabetes mellitus with foot ulcer: Secondary | ICD-10-CM | POA: Diagnosis not present

## 2016-12-23 DIAGNOSIS — L97522 Non-pressure chronic ulcer of other part of left foot with fat layer exposed: Secondary | ICD-10-CM | POA: Diagnosis not present

## 2016-12-23 DIAGNOSIS — Z89422 Acquired absence of other left toe(s): Secondary | ICD-10-CM | POA: Diagnosis not present

## 2016-12-23 DIAGNOSIS — Z7901 Long term (current) use of anticoagulants: Secondary | ICD-10-CM | POA: Diagnosis not present

## 2016-12-23 DIAGNOSIS — I129 Hypertensive chronic kidney disease with stage 1 through stage 4 chronic kidney disease, or unspecified chronic kidney disease: Secondary | ICD-10-CM | POA: Diagnosis not present

## 2016-12-23 DIAGNOSIS — Z4800 Encounter for change or removal of nonsurgical wound dressing: Secondary | ICD-10-CM | POA: Diagnosis not present

## 2016-12-23 DIAGNOSIS — Z794 Long term (current) use of insulin: Secondary | ICD-10-CM | POA: Diagnosis not present

## 2016-12-23 DIAGNOSIS — E1122 Type 2 diabetes mellitus with diabetic chronic kidney disease: Secondary | ICD-10-CM | POA: Diagnosis not present

## 2016-12-23 DIAGNOSIS — N183 Chronic kidney disease, stage 3 (moderate): Secondary | ICD-10-CM | POA: Diagnosis not present

## 2016-12-23 NOTE — Telephone Encounter (Signed)
Great - thanks

## 2016-12-25 DIAGNOSIS — Z89422 Acquired absence of other left toe(s): Secondary | ICD-10-CM | POA: Diagnosis not present

## 2016-12-25 DIAGNOSIS — E11621 Type 2 diabetes mellitus with foot ulcer: Secondary | ICD-10-CM | POA: Diagnosis not present

## 2016-12-25 DIAGNOSIS — M79672 Pain in left foot: Secondary | ICD-10-CM | POA: Diagnosis not present

## 2016-12-25 DIAGNOSIS — Z4800 Encounter for change or removal of nonsurgical wound dressing: Secondary | ICD-10-CM | POA: Diagnosis not present

## 2016-12-25 DIAGNOSIS — Z794 Long term (current) use of insulin: Secondary | ICD-10-CM | POA: Diagnosis not present

## 2016-12-25 DIAGNOSIS — E1142 Type 2 diabetes mellitus with diabetic polyneuropathy: Secondary | ICD-10-CM | POA: Diagnosis not present

## 2016-12-25 DIAGNOSIS — M25561 Pain in right knee: Secondary | ICD-10-CM | POA: Diagnosis not present

## 2016-12-25 DIAGNOSIS — L97522 Non-pressure chronic ulcer of other part of left foot with fat layer exposed: Secondary | ICD-10-CM | POA: Diagnosis not present

## 2016-12-25 DIAGNOSIS — Z7901 Long term (current) use of anticoagulants: Secondary | ICD-10-CM | POA: Diagnosis not present

## 2016-12-25 DIAGNOSIS — E1122 Type 2 diabetes mellitus with diabetic chronic kidney disease: Secondary | ICD-10-CM | POA: Diagnosis not present

## 2016-12-25 DIAGNOSIS — G894 Chronic pain syndrome: Secondary | ICD-10-CM | POA: Diagnosis not present

## 2016-12-25 DIAGNOSIS — E11311 Type 2 diabetes mellitus with unspecified diabetic retinopathy with macular edema: Secondary | ICD-10-CM | POA: Diagnosis not present

## 2016-12-25 DIAGNOSIS — N183 Chronic kidney disease, stage 3 (moderate): Secondary | ICD-10-CM | POA: Diagnosis not present

## 2016-12-25 DIAGNOSIS — M545 Low back pain: Secondary | ICD-10-CM | POA: Diagnosis not present

## 2016-12-25 DIAGNOSIS — I129 Hypertensive chronic kidney disease with stage 1 through stage 4 chronic kidney disease, or unspecified chronic kidney disease: Secondary | ICD-10-CM | POA: Diagnosis not present

## 2016-12-25 DIAGNOSIS — M25562 Pain in left knee: Secondary | ICD-10-CM | POA: Diagnosis not present

## 2016-12-28 ENCOUNTER — Other Ambulatory Visit: Payer: Self-pay

## 2016-12-28 DIAGNOSIS — Z7901 Long term (current) use of anticoagulants: Secondary | ICD-10-CM | POA: Diagnosis not present

## 2016-12-28 DIAGNOSIS — E11311 Type 2 diabetes mellitus with unspecified diabetic retinopathy with macular edema: Secondary | ICD-10-CM | POA: Diagnosis not present

## 2016-12-28 DIAGNOSIS — E11621 Type 2 diabetes mellitus with foot ulcer: Secondary | ICD-10-CM | POA: Diagnosis not present

## 2016-12-28 DIAGNOSIS — I129 Hypertensive chronic kidney disease with stage 1 through stage 4 chronic kidney disease, or unspecified chronic kidney disease: Secondary | ICD-10-CM | POA: Diagnosis not present

## 2016-12-28 DIAGNOSIS — L97522 Non-pressure chronic ulcer of other part of left foot with fat layer exposed: Secondary | ICD-10-CM | POA: Diagnosis not present

## 2016-12-28 DIAGNOSIS — Z794 Long term (current) use of insulin: Secondary | ICD-10-CM | POA: Diagnosis not present

## 2016-12-28 DIAGNOSIS — E1142 Type 2 diabetes mellitus with diabetic polyneuropathy: Secondary | ICD-10-CM

## 2016-12-28 DIAGNOSIS — Z89422 Acquired absence of other left toe(s): Secondary | ICD-10-CM | POA: Diagnosis not present

## 2016-12-28 DIAGNOSIS — IMO0002 Reserved for concepts with insufficient information to code with codable children: Secondary | ICD-10-CM

## 2016-12-28 DIAGNOSIS — E1122 Type 2 diabetes mellitus with diabetic chronic kidney disease: Secondary | ICD-10-CM | POA: Diagnosis not present

## 2016-12-28 DIAGNOSIS — N183 Chronic kidney disease, stage 3 (moderate): Secondary | ICD-10-CM

## 2016-12-28 DIAGNOSIS — I10 Essential (primary) hypertension: Secondary | ICD-10-CM

## 2016-12-28 DIAGNOSIS — Z4800 Encounter for change or removal of nonsurgical wound dressing: Secondary | ICD-10-CM | POA: Diagnosis not present

## 2016-12-28 DIAGNOSIS — E1165 Type 2 diabetes mellitus with hyperglycemia: Secondary | ICD-10-CM

## 2016-12-28 MED ORDER — DICLOFENAC SODIUM 1 % TD GEL: 4.0000 g | Freq: Four times a day (QID) | TRANSDERMAL | 2 refills | Status: DC

## 2016-12-28 MED ORDER — ENALAPRIL MALEATE 20 MG PO TABS: ORAL_TABLET | ORAL | 1 refills | Status: DC

## 2016-12-28 MED ORDER — OMEPRAZOLE 20 MG PO CPDR: DELAYED_RELEASE_CAPSULE | ORAL | 1 refills | Status: DC

## 2016-12-28 MED ORDER — DOCUSATE SODIUM 100 MG PO CAPS: 100.0000 mg | ORAL_CAPSULE | Freq: Every day | ORAL | 0 refills | Status: DC | PRN

## 2016-12-28 NOTE — Telephone Encounter (Signed)
I only filled 4 meds. All other either Rx'd by another specialty, too early to refill and has refills at [facility], or not filled since 2016.

## 2016-12-29 ENCOUNTER — Telehealth: Payer: Self-pay | Admitting: Sports Medicine

## 2016-12-29 ENCOUNTER — Telehealth: Payer: Self-pay | Admitting: *Deleted

## 2016-12-29 NOTE — Telephone Encounter (Signed)
Can you call patient to see what his question is? It's probably the Regranex and if so the nurse should apply when she comes and it should stay refrigerated. Thanks Dr. Cannon Kettle

## 2016-12-29 NOTE — Telephone Encounter (Signed)
I spoke with pt, he states he received the new medication in a big box, does he take it out and put in the refrigerator or does he leave it in the box. I told pt to put the medication in the refrigerator. Pt asked if he could shower every day or would that be too much. I told pt to shower as needed, be certain to shower prior to the [facility] nurse redressing the foot. Pt states understanding.

## 2016-12-29 NOTE — Telephone Encounter (Addendum)
Prior Authorization information was sent to CoverMyMeds  for Voltaren Gel 1%.  Approved starting 12/29/2016 thru 07/12/2017.  Branch was called at [phone] and given the approval notice.  [name], RN 12/29/2016 3:12 PM.

## 2016-12-29 NOTE — Telephone Encounter (Signed)
Pt. Has a question about medication that was delivered.

## 2016-12-30 DIAGNOSIS — Z89422 Acquired absence of other left toe(s): Secondary | ICD-10-CM | POA: Diagnosis not present

## 2016-12-30 DIAGNOSIS — Z7901 Long term (current) use of anticoagulants: Secondary | ICD-10-CM | POA: Diagnosis not present

## 2016-12-30 DIAGNOSIS — Z4800 Encounter for change or removal of nonsurgical wound dressing: Secondary | ICD-10-CM | POA: Diagnosis not present

## 2016-12-30 DIAGNOSIS — L97522 Non-pressure chronic ulcer of other part of left foot with fat layer exposed: Secondary | ICD-10-CM | POA: Diagnosis not present

## 2016-12-30 DIAGNOSIS — I129 Hypertensive chronic kidney disease with stage 1 through stage 4 chronic kidney disease, or unspecified chronic kidney disease: Secondary | ICD-10-CM | POA: Diagnosis not present

## 2016-12-30 DIAGNOSIS — Z794 Long term (current) use of insulin: Secondary | ICD-10-CM | POA: Diagnosis not present

## 2016-12-30 DIAGNOSIS — N183 Chronic kidney disease, stage 3 (moderate): Secondary | ICD-10-CM | POA: Diagnosis not present

## 2016-12-30 DIAGNOSIS — E11311 Type 2 diabetes mellitus with unspecified diabetic retinopathy with macular edema: Secondary | ICD-10-CM | POA: Diagnosis not present

## 2016-12-30 DIAGNOSIS — E1142 Type 2 diabetes mellitus with diabetic polyneuropathy: Secondary | ICD-10-CM | POA: Diagnosis not present

## 2016-12-30 DIAGNOSIS — E11621 Type 2 diabetes mellitus with foot ulcer: Secondary | ICD-10-CM | POA: Diagnosis not present

## 2016-12-30 DIAGNOSIS — E1122 Type 2 diabetes mellitus with diabetic chronic kidney disease: Secondary | ICD-10-CM | POA: Diagnosis not present

## 2016-12-30 NOTE — Telephone Encounter (Signed)
Yes just sterile guaze, abd, kerlix, and ACE wrap

## 2016-12-31 ENCOUNTER — Other Ambulatory Visit: Payer: Self-pay | Admitting: Internal Medicine

## 2016-12-31 ENCOUNTER — Other Ambulatory Visit: Payer: Self-pay | Admitting: Endocrinology

## 2016-12-31 DIAGNOSIS — Z89422 Acquired absence of other left toe(s): Secondary | ICD-10-CM | POA: Diagnosis not present

## 2016-12-31 DIAGNOSIS — Z7901 Long term (current) use of anticoagulants: Secondary | ICD-10-CM | POA: Diagnosis not present

## 2016-12-31 DIAGNOSIS — E11621 Type 2 diabetes mellitus with foot ulcer: Secondary | ICD-10-CM | POA: Diagnosis not present

## 2016-12-31 DIAGNOSIS — N183 Chronic kidney disease, stage 3 (moderate): Secondary | ICD-10-CM | POA: Diagnosis not present

## 2016-12-31 DIAGNOSIS — E1142 Type 2 diabetes mellitus with diabetic polyneuropathy: Secondary | ICD-10-CM | POA: Diagnosis not present

## 2016-12-31 DIAGNOSIS — E1122 Type 2 diabetes mellitus with diabetic chronic kidney disease: Secondary | ICD-10-CM | POA: Diagnosis not present

## 2016-12-31 DIAGNOSIS — E11311 Type 2 diabetes mellitus with unspecified diabetic retinopathy with macular edema: Secondary | ICD-10-CM | POA: Diagnosis not present

## 2016-12-31 DIAGNOSIS — L97522 Non-pressure chronic ulcer of other part of left foot with fat layer exposed: Secondary | ICD-10-CM | POA: Diagnosis not present

## 2016-12-31 DIAGNOSIS — Z4800 Encounter for change or removal of nonsurgical wound dressing: Secondary | ICD-10-CM | POA: Diagnosis not present

## 2016-12-31 DIAGNOSIS — Z794 Long term (current) use of insulin: Secondary | ICD-10-CM | POA: Diagnosis not present

## 2016-12-31 DIAGNOSIS — I129 Hypertensive chronic kidney disease with stage 1 through stage 4 chronic kidney disease, or unspecified chronic kidney disease: Secondary | ICD-10-CM | POA: Diagnosis not present

## 2017-01-01 DIAGNOSIS — L97522 Non-pressure chronic ulcer of other part of left foot with fat layer exposed: Secondary | ICD-10-CM | POA: Diagnosis not present

## 2017-01-01 DIAGNOSIS — E11621 Type 2 diabetes mellitus with foot ulcer: Secondary | ICD-10-CM | POA: Diagnosis not present

## 2017-01-01 DIAGNOSIS — E11311 Type 2 diabetes mellitus with unspecified diabetic retinopathy with macular edema: Secondary | ICD-10-CM | POA: Diagnosis not present

## 2017-01-01 DIAGNOSIS — E119 Type 2 diabetes mellitus without complications: Secondary | ICD-10-CM | POA: Diagnosis not present

## 2017-01-01 DIAGNOSIS — Z794 Long term (current) use of insulin: Secondary | ICD-10-CM | POA: Diagnosis not present

## 2017-01-01 DIAGNOSIS — E1122 Type 2 diabetes mellitus with diabetic chronic kidney disease: Secondary | ICD-10-CM | POA: Diagnosis not present

## 2017-01-01 DIAGNOSIS — I129 Hypertensive chronic kidney disease with stage 1 through stage 4 chronic kidney disease, or unspecified chronic kidney disease: Secondary | ICD-10-CM | POA: Diagnosis not present

## 2017-01-01 DIAGNOSIS — Z89422 Acquired absence of other left toe(s): Secondary | ICD-10-CM | POA: Diagnosis not present

## 2017-01-01 DIAGNOSIS — N183 Chronic kidney disease, stage 3 (moderate): Secondary | ICD-10-CM | POA: Diagnosis not present

## 2017-01-01 DIAGNOSIS — Z7901 Long term (current) use of anticoagulants: Secondary | ICD-10-CM | POA: Diagnosis not present

## 2017-01-01 DIAGNOSIS — E1142 Type 2 diabetes mellitus with diabetic polyneuropathy: Secondary | ICD-10-CM | POA: Diagnosis not present

## 2017-01-01 DIAGNOSIS — H40033 Anatomical narrow angle, bilateral: Secondary | ICD-10-CM | POA: Diagnosis not present

## 2017-01-01 DIAGNOSIS — Z4800 Encounter for change or removal of nonsurgical wound dressing: Secondary | ICD-10-CM | POA: Diagnosis not present

## 2017-01-01 LAB — HM DIABETES EYE EXAM

## 2017-01-02 DIAGNOSIS — Z7901 Long term (current) use of anticoagulants: Secondary | ICD-10-CM | POA: Diagnosis not present

## 2017-01-02 DIAGNOSIS — L97522 Non-pressure chronic ulcer of other part of left foot with fat layer exposed: Secondary | ICD-10-CM | POA: Diagnosis not present

## 2017-01-02 DIAGNOSIS — E11311 Type 2 diabetes mellitus with unspecified diabetic retinopathy with macular edema: Secondary | ICD-10-CM | POA: Diagnosis not present

## 2017-01-02 DIAGNOSIS — N183 Chronic kidney disease, stage 3 (moderate): Secondary | ICD-10-CM | POA: Diagnosis not present

## 2017-01-02 DIAGNOSIS — Z794 Long term (current) use of insulin: Secondary | ICD-10-CM | POA: Diagnosis not present

## 2017-01-02 DIAGNOSIS — E11621 Type 2 diabetes mellitus with foot ulcer: Secondary | ICD-10-CM | POA: Diagnosis not present

## 2017-01-02 DIAGNOSIS — Z4800 Encounter for change or removal of nonsurgical wound dressing: Secondary | ICD-10-CM | POA: Diagnosis not present

## 2017-01-02 DIAGNOSIS — Z89422 Acquired absence of other left toe(s): Secondary | ICD-10-CM | POA: Diagnosis not present

## 2017-01-02 DIAGNOSIS — I129 Hypertensive chronic kidney disease with stage 1 through stage 4 chronic kidney disease, or unspecified chronic kidney disease: Secondary | ICD-10-CM | POA: Diagnosis not present

## 2017-01-02 DIAGNOSIS — E1122 Type 2 diabetes mellitus with diabetic chronic kidney disease: Secondary | ICD-10-CM | POA: Diagnosis not present

## 2017-01-02 DIAGNOSIS — E1142 Type 2 diabetes mellitus with diabetic polyneuropathy: Secondary | ICD-10-CM | POA: Diagnosis not present

## 2017-01-03 DIAGNOSIS — Z794 Long term (current) use of insulin: Secondary | ICD-10-CM | POA: Diagnosis not present

## 2017-01-03 DIAGNOSIS — I129 Hypertensive chronic kidney disease with stage 1 through stage 4 chronic kidney disease, or unspecified chronic kidney disease: Secondary | ICD-10-CM | POA: Diagnosis not present

## 2017-01-03 DIAGNOSIS — E1142 Type 2 diabetes mellitus with diabetic polyneuropathy: Secondary | ICD-10-CM | POA: Diagnosis not present

## 2017-01-03 DIAGNOSIS — N183 Chronic kidney disease, stage 3 (moderate): Secondary | ICD-10-CM | POA: Diagnosis not present

## 2017-01-03 DIAGNOSIS — Z4800 Encounter for change or removal of nonsurgical wound dressing: Secondary | ICD-10-CM | POA: Diagnosis not present

## 2017-01-03 DIAGNOSIS — L97522 Non-pressure chronic ulcer of other part of left foot with fat layer exposed: Secondary | ICD-10-CM | POA: Diagnosis not present

## 2017-01-03 DIAGNOSIS — E11311 Type 2 diabetes mellitus with unspecified diabetic retinopathy with macular edema: Secondary | ICD-10-CM | POA: Diagnosis not present

## 2017-01-03 DIAGNOSIS — Z89422 Acquired absence of other left toe(s): Secondary | ICD-10-CM | POA: Diagnosis not present

## 2017-01-03 DIAGNOSIS — E11621 Type 2 diabetes mellitus with foot ulcer: Secondary | ICD-10-CM | POA: Diagnosis not present

## 2017-01-03 DIAGNOSIS — Z7901 Long term (current) use of anticoagulants: Secondary | ICD-10-CM | POA: Diagnosis not present

## 2017-01-03 DIAGNOSIS — E1122 Type 2 diabetes mellitus with diabetic chronic kidney disease: Secondary | ICD-10-CM | POA: Diagnosis not present

## 2017-01-04 ENCOUNTER — Ambulatory Visit (INDEPENDENT_AMBULATORY_CARE_PROVIDER_SITE_OTHER): Payer: Medicare Other | Admitting: Endocrinology

## 2017-01-04 ENCOUNTER — Encounter: Payer: Self-pay | Admitting: Endocrinology

## 2017-01-04 VITALS — BP 142/86 | HR 64 | Ht 77.5 in | Wt 394.8 lb

## 2017-01-04 DIAGNOSIS — Z794 Long term (current) use of insulin: Secondary | ICD-10-CM | POA: Diagnosis not present

## 2017-01-04 DIAGNOSIS — E1165 Type 2 diabetes mellitus with hyperglycemia: Secondary | ICD-10-CM

## 2017-01-04 DIAGNOSIS — Z4800 Encounter for change or removal of nonsurgical wound dressing: Secondary | ICD-10-CM | POA: Diagnosis not present

## 2017-01-04 DIAGNOSIS — E1142 Type 2 diabetes mellitus with diabetic polyneuropathy: Secondary | ICD-10-CM | POA: Diagnosis not present

## 2017-01-04 DIAGNOSIS — Z89422 Acquired absence of other left toe(s): Secondary | ICD-10-CM | POA: Diagnosis not present

## 2017-01-04 DIAGNOSIS — I129 Hypertensive chronic kidney disease with stage 1 through stage 4 chronic kidney disease, or unspecified chronic kidney disease: Secondary | ICD-10-CM | POA: Diagnosis not present

## 2017-01-04 DIAGNOSIS — Z7901 Long term (current) use of anticoagulants: Secondary | ICD-10-CM | POA: Diagnosis not present

## 2017-01-04 DIAGNOSIS — N183 Chronic kidney disease, stage 3 (moderate): Secondary | ICD-10-CM | POA: Diagnosis not present

## 2017-01-04 DIAGNOSIS — E11621 Type 2 diabetes mellitus with foot ulcer: Secondary | ICD-10-CM | POA: Diagnosis not present

## 2017-01-04 DIAGNOSIS — E1122 Type 2 diabetes mellitus with diabetic chronic kidney disease: Secondary | ICD-10-CM | POA: Diagnosis not present

## 2017-01-04 DIAGNOSIS — L97522 Non-pressure chronic ulcer of other part of left foot with fat layer exposed: Secondary | ICD-10-CM | POA: Diagnosis not present

## 2017-01-04 DIAGNOSIS — E11311 Type 2 diabetes mellitus with unspecified diabetic retinopathy with macular edema: Secondary | ICD-10-CM | POA: Diagnosis not present

## 2017-01-04 NOTE — Patient Instructions (Addendum)
Try taking shot 30 min before meals

## 2017-01-04 NOTE — Progress Notes (Signed)
Patient ID: [name], male   DOB: Feb 13, 1951, 66 y.o.   MRN: [id number]           Reason for Appointment: Follow-up for Type 2 Diabetes  Referring physician: Dareen Piano  History of Present Illness:          Date of diagnosis of type 2 diabetes mellitus: 2007       Background history:   He was diagnosed to have diabetes when he had an ulcer on his left third toe which led to amputation.  His glucose was apparently about 400 and he was started on insulin at that time He does not know if he has taken any oral hypoglycemic drugs in the past His A1c in 2016 has been consistently over 8%  Recent history:   INSULIN regimen is:  TOUJEO  74 in the morning and 74 in the evening HUMULIN U-500 insulin: 20/40 units in the morning, 60/70 at lunch and 70 at supper  Non-insulin hypoglycemic drugs the patient is taking are: Victoza 1.8 mg daily       On his initial consultation was started on Victoza and subsequently started on U-500 insulin in 2/17 along with Toujeo  Blood sugars have been difficult to control and A1c was last still high at 9.2  He was recommended starting the V-go pump in January but apparently this was too expensive and did not look into an assistance program as directed  Current blood sugar patterns and problems identified:  His blood sugars are mildly high overall has a before with fluctuation  His blood sugars fluctuate the most in the afternoons and before supper time including occasional hypoglycemia which is mild usually  He says he is trying to take his Humulin R 30 minutes before eating but does not always do so   FASTING blood sugars are inconsistent and only in the last week or so they have been mostly near normal  He takes significant amount of insulin in the morning even when he is eating a light breakfast but his blood sugars and go up around midday and afternoon  However no consistent pattern seen again  Does not check readings after supper very  much and these are mildly increased overall  He has seen the dietitian in the past  Hypoglycemia: Minimal  Glucose monitoring:  done 3-4  times a day         Glucometer:  Accu-Chek     Blood Glucose readings by download:  Mean values apply above for all meters except median for One Touch  PRE-MEAL Fasting Lunch Dinner Bedtime Overall  Glucose range: 102-194    60-242  90-264    Mean/median: 157     168   Afternoon average 171   Self-care:  Typical meal intake: Breakfast is sometimes fruit or cereal, Sometimes eating sausage and grits Usually trying to avoid high-fat foods or fried food  Lunch 12 noon Dinner at 5-6 pm He drinks grapefruit or V-8 juice and diet drinks                Dietician consultation: 12/16               Exercise: not walking,  has 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  Latest known visit with results is:  Lab on 10/22/2016  Component Date Value Ref Range Status  . Hgb A1c MFr Bld 10/22/2016 9.2* 4.6 - 6.5 % Final   Glycemic Control Guidelines for People with Diabetes:Non Diabetic:  <6%Goal of Therapy: <7%Additional Action Suggested:  >8%   . Sodium 10/22/2016 137  135 - 145 mEq/L Final  . Potassium 10/22/2016 4.0  3.5 - 5.1 mEq/L Final  . Chloride 10/22/2016 104  96 - 112 mEq/L Final  . CO2 10/22/2016 28  19 - 32 mEq/L Final  . Glucose, Bld 10/22/2016 152* 70 - 99 mg/dL Final  . BUN 10/22/2016 15  6 - 23 mg/dL Final  . Creatinine, Ser  10/22/2016 1.55* 0.40 - 1.50 mg/dL Final  . Calcium 10/22/2016 9.1  8.4 - 10.5 mg/dL Final  . GFR 10/22/2016 57.87* >60.00 mL/min Final  . Cholesterol 10/22/2016 110  0 - 200 mg/dL Final   ATP III Classification       Desirable:  < 200 mg/dL               Borderline High:  200 - 239 mg/dL          High:  > = 240 mg/dL  . Triglycerides 10/22/2016 126.0  0.0 - 149.0 mg/dL Final   Normal:  <150 mg/dLBorderline High:  150 - 199 mg/dL  . HDL 10/22/2016 28.50* >39.00 mg/dL Final  . VLDL 10/22/2016 25.2  0.0 - 40.0 mg/dL Final  . LDL Cholesterol 10/22/2016 57  0 - 99 mg/dL Final  . Total CHOL/HDL Ratio 10/22/2016 4   Final                  Men          Women1/2 Average Risk     3.4          3.3Average Risk          5.0          4.42X Average Risk          9.6          7.13X Average Risk          15.0          11.0                      . NonHDL 10/22/2016 81.96   Final   NOTE:  Non-HDL goal should be 30 mg/dL higher than patient's LDL goal (i.e. LDL goal of < 70 mg/dL, would have non-HDL goal of < 100 mg/dL)       Allergies as of 01/04/2017      Reactions   Vancomycin    REACTION: ARF      Medication List       Accurate as of 01/04/17 11:59 PM. Always use your most recent med list.          ACCU-CHEK AVIVA PLUS w/Device Kit Check blood sugar five times a day   ACCU-CHEK FASTCLIX LANCETS Misc Check blood sugar 5 times a day   atorvastatin 20 MG tablet Commonly known as:  LIPITOR TAKE  1 TABLET(20 MG) BY MOUTH DAILY   becaplermin 0.01 % gel Commonly known as:  REGRANEX Apply 1 application topically daily.   BESIVANCE 0.6 % Susp Generic drug:  Besifloxacin HCl Place 1 drop into both eyes 4 (four) times daily.   diclofenac sodium 1 % Gel Commonly known as:  VOLTAREN Apply 4 g topically 4 (four) times daily.   docusate sodium 100 MG capsule Commonly known as:  COLACE Take 1 capsule (100 mg total) by mouth daily as needed for mild constipation.   ELIQUIS 5 MG Tabs  tablet Generic drug:  apixaban TAKE 1 TABLET(5 MG) BY MOUTH TWICE DAILY   enalapril 20 MG tablet Commonly known as:  VASOTEC TAKE 2 TABLETS(40 MG) BY MOUTH DAILY   furosemide 40 MG tablet Commonly known as:  LASIX TAKE 1 TABLET(40 MG) BY MOUTH TWICE DAILY   gabapentin 300 MG capsule Commonly known as:  NEURONTIN TAKE 1 CAPSULE(300 MG) BY MOUTH AT BEDTIME   glucose blood test strip Commonly known as:  ACCU-CHEK AVIVA PLUS USE TO CHECK BLOOD Five TIMES DAILY   HYDROcodone-acetaminophen 7.5-325 MG tablet Commonly known as:  NORCO Take 1 tablet by mouth every 6 (six) hours as needed for moderate pain.   Insulin Pen Needle 31G X 5 MM Misc Use for insulin injection 5 times a day.   insulin regular human CONCENTRATED 500 UNIT/ML kwikpen Commonly known as:  HUMULIN R U-500 KWIKPEN INJECT 35 UNITS AT BREAKFAST, 60 UNITS AT LUNCH, AND 70 UNITS AT DINNER   LUMIGAN 0.01 % Soln Generic drug:  bimatoprost Place 1 drop into both eyes at bedtime.   metoprolol succinate 25 MG 24 hr tablet Commonly known as:  TOPROL-XL TAKE 1 TABLET(25 MG) BY MOUTH DAILY   omeprazole 20 MG capsule Commonly known as:  PRILOSEC TAKE 1 CAPSULE(20 MG) BY MOUTH DAILY   PROAIR HFA 108 (90 Base) MCG/ACT inhaler Generic drug:  albuterol INHALE 2 PUFFS INTO THE LUNGS BY MOUTH EVERY 6 HOURS AS NEEDED FOR WHEEZING OR SHORTNESS OF BREATH   promethazine 25 MG tablet Commonly known as:  PHENERGAN Take 1 tablet (25 mg total) by mouth every 8 (eight) hours as needed for nausea or vomiting.   silver sulfADIAZINE 1 % cream Commonly known as:  SILVADENE Apply 1 application topically daily.   SIMBRINZA 1-0.2 % Susp Generic drug:  Brinzolamide-Brimonidine Place 1 drop into both eyes 2 (two) times daily. Take as instructed by Dr. Katy Fitch.   TOUJEO SOLOSTAR 300 UNIT/ML Sopn Generic drug:  Insulin Glargine INJECT 74 UNITS INTO SKIN TWICE DAILY   VICTOZA 18 MG/3ML Sopn Generic drug:  liraglutide ADMINISTER 1.8 MG  UNDER THE SKIN AT THE SAME TIME EVERY DAY   Vitamin D3 2000 units capsule Take 1 capsule (2,000 Units total) by mouth daily.       Allergies:  Allergies  Allergen Reactions  . Vancomycin     REACTION: ARF    Past Medical History:  Diagnosis Date  . Arthritis    "elbows & knees" (12/13/2014)  . Asthma   . CKD (chronic kidney disease), stage III   . DIABETIC FOOT ULCER 06/20/2009  . Edema, macular, due to secondary diabetes ([place])   . Erectile dysfunction   . GERD (gastroesophageal reflux disease)   . HEARING LOSS, SENSORINEURAL, BILATERAL 01/03/2007   Seen by ENT Dr. Orpah Greek D. Redmond Baseman 01/03/07  . Hemorrhoids   . History of echocardiogram    a. 04/2008 Echo: EF 50-55%, abnl LV relaxation, mildly dil LA.  Marland Kitchen  Hyperlipidemia   . Hypertension   . Morbid obesity ([place])   . Neuropathy, lower extremity   . OSA (obstructive sleep apnea)    uses CPAP nightly  . OSA on CPAP    Nocturnal polysomnogram on 01/21/2010 showed severe obstructive sleep apnea/hypopnea syndrome, AHI 74.1 per hour with non positional events, moderately loud snoring, and oxygen desaturation to a nadir of 78% on room air.  CPAP was successfully titrated to 17 CWP, AHI 1.1 per hour using a large ResMed Mirage Quattro full-face mask with heated humidifier. Bruxism was noted.   . Osteomyelitis of ankle and foot ([place])   . Retinopathy   . Type II diabetes mellitus ([facility])    w/complication NOS, type II    Past Surgical History:  Procedure Laterality Date  . AMPUTATION Left 12/15/2014   Procedure: LEFT SECOND TOE AMPUTATION ;  Surgeon: Dorna Leitz, MD;  Location: [facility];  Service: Orthopedics;  Laterality: Left;  . 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 Kathalene Frames. Mayer Camel.  . WOUND DEBRIDEMENT Left 06/15/2016   Procedure: DEBRIDEMENT WOUND LEFT FOOT WITH GRAFT APPLICATION;  Surgeon: Landis Martins, DPM;  Location: [facility];  Service:  Podiatry;  Laterality: Left;    Family History  Problem Relation Age of Onset  . Diabetes Mother        also 2 siblings  . Heart attack Father 66  . Throat cancer Brother     Social History:  reports that he quit smoking about 6 years ago. His smoking use included Cigarettes. He has a 15.00 pack-year smoking history. He has never used smokeless tobacco. He reports that he drinks about 6.6 oz of alcohol per week . He reports that he does not use drugs.    Review of Systems    Lipid management: taking Lipitor 20 mg, followed by PCP    Lab Results  Component Value Date   CHOL 110 10/22/2016   HDL 28.50 (L) 10/22/2016   LDLCALC 57 10/22/2016   TRIG 126.0 10/22/2016   CHOLHDL 4 10/22/2016            Hypertension: Has had hypertension for a few years treated with enalapril,  taking 40 mg  Also on Lasix and 25 mg metoprolol This is treated by PCP  Last foot exam done by podiatrist Has been seen regularly by podiatrist, is getting better healing now   Physical Examination:  BP (!) 142/86   Pulse 64   Ht 6' 5.5" (1.969 m)   Wt (!) 394 lb 12.8 oz (179.1 kg)   SpO2 98%   BMI 46.21 kg/m       ASSESSMENT:  Diabetes type 2, uncontrolled with morbid obesity  See history of present illness for detailed discussion of current diabetes management, blood sugar patterns and problems identified  He is  insulin resistant and taking large amounts of insulin, both Humulin R and basal insulin   A1c is around or over 9%, no recent labs available  His blood sugars are fluctuating at various times but mostly in the midday and afternoon More recently fasting readings are more consistent No significant hypoglycemia but most likely his blood sugars are fluctuating because of variable diet, variable action of Humulin R and not always taking this 30 min before eating Again discussed that he may benefit better from using and infusion pumps as of the V-go but apparently this was previously  costing too  much for him Day-to-day management of diabetes discussed  HYPERTENSION:  Blood pressure is controlled  Lipids: Adequately controlled with Lipitor   PLAN:     Will check with the V-go company again to see if he can get assistance for the supplies  He does need to adjust his morning doses of insulin based on what he is eating and take at least 35 units in the morning if having a full breakfast  Otherwise will only change his dose of Toujeo in the evening  If he is having high readings at bedtime he may consider increasing  suppertime Humulin R also  Discussed the freestyle [place] system and will consider this on the next visit, meanwhile start checking readings 4 times a day  Continue Victoza unchanged  He may not be a candidate for drug like Invokana because of mild chronic renal impairment  He has more consistent hyperglycemia in the afternoon will increase to lunchtime dose   Patient Instructions  Try taking shot 30 min before meals     Counseling time on subjects discussed above is over 50% of today's 25 minute visit    [name] 01/05/2017, 8:11 AM   Note: This office note was prepared with Dragon voice recognition system technology. Any transcriptional errors that result from this process are unintentional.

## 2017-01-05 ENCOUNTER — Encounter: Payer: Self-pay | Admitting: Podiatry

## 2017-01-05 ENCOUNTER — Ambulatory Visit (INDEPENDENT_AMBULATORY_CARE_PROVIDER_SITE_OTHER): Payer: Medicare Other | Admitting: Podiatry

## 2017-01-05 VITALS — BP 154/90 | HR 61 | Temp 96.6°F

## 2017-01-05 DIAGNOSIS — L97522 Non-pressure chronic ulcer of other part of left foot with fat layer exposed: Secondary | ICD-10-CM

## 2017-01-05 NOTE — Progress Notes (Signed)
Patient ID: [name], male   DOB: [birth year], 66 y.o.   MRN: [id number]   Subjective: This patient presents today for ongoing care for diabetic plantar skin ulcer. Patient under care of Dr. Cannon Kettle. Patient currently applying reranex to the wound daily and offloading with the Darco wedge shoe  Objective: Orientated 3 Amputated second and third left toes Plantar wound measures 10 x 20 mm with a granular base surrounded by hyperkeratotic tissue. There is no surrounding erythema, edema, warmth  Assessment: Diabetic plantar ulcer left foot without clinical sign of infection  Plan: Debrided ulcer and reapply gauze dressing with hydrogel Patient will maintain outpatient therapy daily with application of Regranx to the wound daily with gauze dressing Maintain Darco wedge shoe left Patient instructed if he knows any sudden increase in pain, swelling, redness, fever present to the emergency department  Reappoint 2 weeks for Dr. Cannon Kettle

## 2017-01-05 NOTE — Progress Notes (Signed)
   Subjective:    Patient ID: [name], male    DOB: 05-08-1951, 66 y.o.   MRN: [id number]  HPI    Review of Systems  Musculoskeletal: Positive for arthralgias and gait problem.  All other systems reviewed and are negative.      Objective:   Physical Exam        Assessment & Plan:

## 2017-01-05 NOTE — Patient Instructions (Signed)
Maintain current local wound care with application of regranex dressings daily Continue wearing surgical wedge shoe Limit standing walking If you develop any sudden increase in pain, swelling, redness, fever present to ED  Diabetes and Foot Care Diabetes may cause you to have problems because of poor blood supply (circulation) to your feet and legs. This may cause the skin on your feet to become thinner, break easier, and heal more slowly. Your skin may become dry, and the skin may peel and crack. You may also have nerve damage in your legs and feet causing decreased feeling in them. You may not notice minor injuries to your feet that could lead to infections or more serious problems. Taking care of your feet is one of the most important things you can do for yourself. Follow these instructions at home:  Wear shoes at all times, even in the house. Do not go barefoot. Bare feet are easily injured.  Check your feet daily for blisters, cuts, and redness. If you cannot see the bottom of your feet, use a mirror or ask someone for help.  Wash your feet with warm water (do not use hot water) and mild soap. Then pat your feet and the areas between your toes until they are completely dry. Do not soak your feet as this can dry your skin.  Apply a moisturizing lotion or petroleum jelly (that does not contain alcohol and is unscented) to the skin on your feet and to dry, brittle toenails. Do not apply lotion between your toes.  Trim your toenails straight across. Do not dig under them or around the cuticle. File the edges of your nails with an emery board or nail file.  Do not cut corns or calluses or try to remove them with medicine.  Wear clean socks or stockings every day. Make sure they are not too tight. Do not wear knee-high stockings since they may decrease blood flow to your legs.  Wear shoes that fit properly and have enough cushioning. To break in new shoes, wear them for just a few hours a day.  This prevents you from injuring your feet. Always look in your shoes before you put them on to be sure there are no objects inside.  Do not cross your legs. This may decrease the blood flow to your feet.  If you find a minor scrape, cut, or break in the skin on your feet, keep it and the skin around it clean and dry. These areas may be cleansed with mild soap and water. Do not cleanse the area with peroxide, alcohol, or iodine.  When you remove an adhesive bandage, be sure not to damage the skin around it.  If you have a wound, look at it several times a day to make sure it is healing.  Do not use heating pads or hot water bottles. They may burn your skin. If you have lost feeling in your feet or legs, you may not know it is happening until it is too late.  Make sure your health care provider performs a complete foot exam at least annually or more often if you have foot problems. Report any cuts, sores, or bruises to your health care provider immediately. Contact a health care provider if:  You have an injury that is not healing.  You have cuts or breaks in the skin.  You have an ingrown nail.  You notice redness on your legs or feet.  You feel burning or tingling in your legs or  feet.  You have pain or cramps in your legs and feet.  Your legs or feet are numb.  Your feet always feel cold. Get help right away if:  There is increasing redness, swelling, or pain in or around a wound.  There is a red line that goes up your leg.  Pus is coming from a wound.  You develop a fever or as directed by your health care provider.  You notice a bad smell coming from an ulcer or wound. This information is not intended to replace advice given to you by your health care provider. Make sure you discuss any questions you have with your health care provider. Document Released: 06/26/2000 Document Revised: 12/05/2015 Document Reviewed: 12/06/2012 Elsevier Interactive Patient Education  2017  [employer].

## 2017-01-06 DIAGNOSIS — L97522 Non-pressure chronic ulcer of other part of left foot with fat layer exposed: Secondary | ICD-10-CM | POA: Diagnosis not present

## 2017-01-06 DIAGNOSIS — E11311 Type 2 diabetes mellitus with unspecified diabetic retinopathy with macular edema: Secondary | ICD-10-CM | POA: Diagnosis not present

## 2017-01-06 DIAGNOSIS — Z89422 Acquired absence of other left toe(s): Secondary | ICD-10-CM | POA: Diagnosis not present

## 2017-01-06 DIAGNOSIS — E11621 Type 2 diabetes mellitus with foot ulcer: Secondary | ICD-10-CM | POA: Diagnosis not present

## 2017-01-06 DIAGNOSIS — Z794 Long term (current) use of insulin: Secondary | ICD-10-CM | POA: Diagnosis not present

## 2017-01-06 DIAGNOSIS — N183 Chronic kidney disease, stage 3 (moderate): Secondary | ICD-10-CM | POA: Diagnosis not present

## 2017-01-06 DIAGNOSIS — E1122 Type 2 diabetes mellitus with diabetic chronic kidney disease: Secondary | ICD-10-CM | POA: Diagnosis not present

## 2017-01-06 DIAGNOSIS — Z7901 Long term (current) use of anticoagulants: Secondary | ICD-10-CM | POA: Diagnosis not present

## 2017-01-06 DIAGNOSIS — Z4800 Encounter for change or removal of nonsurgical wound dressing: Secondary | ICD-10-CM | POA: Diagnosis not present

## 2017-01-06 DIAGNOSIS — I129 Hypertensive chronic kidney disease with stage 1 through stage 4 chronic kidney disease, or unspecified chronic kidney disease: Secondary | ICD-10-CM | POA: Diagnosis not present

## 2017-01-06 DIAGNOSIS — E1142 Type 2 diabetes mellitus with diabetic polyneuropathy: Secondary | ICD-10-CM | POA: Diagnosis not present

## 2017-01-07 ENCOUNTER — Telehealth: Payer: Self-pay | Admitting: Sports Medicine

## 2017-01-07 DIAGNOSIS — E1122 Type 2 diabetes mellitus with diabetic chronic kidney disease: Secondary | ICD-10-CM | POA: Diagnosis not present

## 2017-01-07 DIAGNOSIS — Z89422 Acquired absence of other left toe(s): Secondary | ICD-10-CM | POA: Diagnosis not present

## 2017-01-07 DIAGNOSIS — E11621 Type 2 diabetes mellitus with foot ulcer: Secondary | ICD-10-CM | POA: Diagnosis not present

## 2017-01-07 DIAGNOSIS — N183 Chronic kidney disease, stage 3 (moderate): Secondary | ICD-10-CM | POA: Diagnosis not present

## 2017-01-07 DIAGNOSIS — E11311 Type 2 diabetes mellitus with unspecified diabetic retinopathy with macular edema: Secondary | ICD-10-CM | POA: Diagnosis not present

## 2017-01-07 DIAGNOSIS — Z7901 Long term (current) use of anticoagulants: Secondary | ICD-10-CM | POA: Diagnosis not present

## 2017-01-07 DIAGNOSIS — I129 Hypertensive chronic kidney disease with stage 1 through stage 4 chronic kidney disease, or unspecified chronic kidney disease: Secondary | ICD-10-CM | POA: Diagnosis not present

## 2017-01-07 DIAGNOSIS — L97522 Non-pressure chronic ulcer of other part of left foot with fat layer exposed: Secondary | ICD-10-CM | POA: Diagnosis not present

## 2017-01-07 DIAGNOSIS — E1142 Type 2 diabetes mellitus with diabetic polyneuropathy: Secondary | ICD-10-CM | POA: Diagnosis not present

## 2017-01-07 DIAGNOSIS — Z4800 Encounter for change or removal of nonsurgical wound dressing: Secondary | ICD-10-CM | POA: Diagnosis not present

## 2017-01-07 DIAGNOSIS — Z794 Long term (current) use of insulin: Secondary | ICD-10-CM | POA: Diagnosis not present

## 2017-01-07 NOTE — Telephone Encounter (Signed)
[name] with [facility] called saying she re certified patient today on last wound orders from Dr. Cannon Kettle. Wants to know how long to change his dressing daily? Requested a call back at [phone] and if she does not answer you can leave a message on her confidential voicemail.

## 2017-01-08 ENCOUNTER — Other Ambulatory Visit: Payer: Self-pay | Admitting: Internal Medicine

## 2017-01-08 ENCOUNTER — Other Ambulatory Visit: Payer: Self-pay | Admitting: Endocrinology

## 2017-01-08 DIAGNOSIS — E1122 Type 2 diabetes mellitus with diabetic chronic kidney disease: Secondary | ICD-10-CM

## 2017-01-08 DIAGNOSIS — E11311 Type 2 diabetes mellitus with unspecified diabetic retinopathy with macular edema: Secondary | ICD-10-CM | POA: Diagnosis not present

## 2017-01-08 DIAGNOSIS — N183 Chronic kidney disease, stage 3 (moderate): Principal | ICD-10-CM

## 2017-01-08 DIAGNOSIS — E11621 Type 2 diabetes mellitus with foot ulcer: Secondary | ICD-10-CM | POA: Diagnosis not present

## 2017-01-08 DIAGNOSIS — E1142 Type 2 diabetes mellitus with diabetic polyneuropathy: Secondary | ICD-10-CM | POA: Diagnosis not present

## 2017-01-08 DIAGNOSIS — Z794 Long term (current) use of insulin: Principal | ICD-10-CM

## 2017-01-08 DIAGNOSIS — Z7901 Long term (current) use of anticoagulants: Secondary | ICD-10-CM | POA: Diagnosis not present

## 2017-01-08 DIAGNOSIS — E1165 Type 2 diabetes mellitus with hyperglycemia: Principal | ICD-10-CM

## 2017-01-08 DIAGNOSIS — L97522 Non-pressure chronic ulcer of other part of left foot with fat layer exposed: Secondary | ICD-10-CM | POA: Diagnosis not present

## 2017-01-08 DIAGNOSIS — I129 Hypertensive chronic kidney disease with stage 1 through stage 4 chronic kidney disease, or unspecified chronic kidney disease: Secondary | ICD-10-CM | POA: Diagnosis not present

## 2017-01-08 DIAGNOSIS — Z4800 Encounter for change or removal of nonsurgical wound dressing: Secondary | ICD-10-CM | POA: Diagnosis not present

## 2017-01-08 DIAGNOSIS — Z89422 Acquired absence of other left toe(s): Secondary | ICD-10-CM | POA: Diagnosis not present

## 2017-01-08 DIAGNOSIS — IMO0002 Reserved for concepts with insufficient information to code with codable children: Secondary | ICD-10-CM

## 2017-01-08 DIAGNOSIS — H04123 Dry eye syndrome of bilateral lacrimal glands: Secondary | ICD-10-CM | POA: Diagnosis not present

## 2017-01-09 DIAGNOSIS — I129 Hypertensive chronic kidney disease with stage 1 through stage 4 chronic kidney disease, or unspecified chronic kidney disease: Secondary | ICD-10-CM | POA: Diagnosis not present

## 2017-01-09 DIAGNOSIS — Z794 Long term (current) use of insulin: Secondary | ICD-10-CM | POA: Diagnosis not present

## 2017-01-09 DIAGNOSIS — E1142 Type 2 diabetes mellitus with diabetic polyneuropathy: Secondary | ICD-10-CM | POA: Diagnosis not present

## 2017-01-09 DIAGNOSIS — L97522 Non-pressure chronic ulcer of other part of left foot with fat layer exposed: Secondary | ICD-10-CM | POA: Diagnosis not present

## 2017-01-09 DIAGNOSIS — Z89422 Acquired absence of other left toe(s): Secondary | ICD-10-CM | POA: Diagnosis not present

## 2017-01-09 DIAGNOSIS — N183 Chronic kidney disease, stage 3 (moderate): Secondary | ICD-10-CM | POA: Diagnosis not present

## 2017-01-09 DIAGNOSIS — Z7901 Long term (current) use of anticoagulants: Secondary | ICD-10-CM | POA: Diagnosis not present

## 2017-01-09 DIAGNOSIS — E11311 Type 2 diabetes mellitus with unspecified diabetic retinopathy with macular edema: Secondary | ICD-10-CM | POA: Diagnosis not present

## 2017-01-09 DIAGNOSIS — Z4800 Encounter for change or removal of nonsurgical wound dressing: Secondary | ICD-10-CM | POA: Diagnosis not present

## 2017-01-09 DIAGNOSIS — E1122 Type 2 diabetes mellitus with diabetic chronic kidney disease: Secondary | ICD-10-CM | POA: Diagnosis not present

## 2017-01-09 DIAGNOSIS — E11621 Type 2 diabetes mellitus with foot ulcer: Secondary | ICD-10-CM | POA: Diagnosis not present

## 2017-01-10 DIAGNOSIS — Z7901 Long term (current) use of anticoagulants: Secondary | ICD-10-CM | POA: Diagnosis not present

## 2017-01-10 DIAGNOSIS — E11311 Type 2 diabetes mellitus with unspecified diabetic retinopathy with macular edema: Secondary | ICD-10-CM | POA: Diagnosis not present

## 2017-01-10 DIAGNOSIS — Z4800 Encounter for change or removal of nonsurgical wound dressing: Secondary | ICD-10-CM | POA: Diagnosis not present

## 2017-01-10 DIAGNOSIS — Z794 Long term (current) use of insulin: Secondary | ICD-10-CM | POA: Diagnosis not present

## 2017-01-10 DIAGNOSIS — E11621 Type 2 diabetes mellitus with foot ulcer: Secondary | ICD-10-CM | POA: Diagnosis not present

## 2017-01-10 DIAGNOSIS — L97522 Non-pressure chronic ulcer of other part of left foot with fat layer exposed: Secondary | ICD-10-CM | POA: Diagnosis not present

## 2017-01-10 DIAGNOSIS — E1122 Type 2 diabetes mellitus with diabetic chronic kidney disease: Secondary | ICD-10-CM | POA: Diagnosis not present

## 2017-01-10 DIAGNOSIS — N183 Chronic kidney disease, stage 3 (moderate): Secondary | ICD-10-CM | POA: Diagnosis not present

## 2017-01-10 DIAGNOSIS — I129 Hypertensive chronic kidney disease with stage 1 through stage 4 chronic kidney disease, or unspecified chronic kidney disease: Secondary | ICD-10-CM | POA: Diagnosis not present

## 2017-01-10 DIAGNOSIS — E1142 Type 2 diabetes mellitus with diabetic polyneuropathy: Secondary | ICD-10-CM | POA: Diagnosis not present

## 2017-01-10 DIAGNOSIS — Z89422 Acquired absence of other left toe(s): Secondary | ICD-10-CM | POA: Diagnosis not present

## 2017-01-11 ENCOUNTER — Encounter: Payer: Self-pay | Admitting: Pharmacist

## 2017-01-11 DIAGNOSIS — L97522 Non-pressure chronic ulcer of other part of left foot with fat layer exposed: Secondary | ICD-10-CM | POA: Diagnosis not present

## 2017-01-11 DIAGNOSIS — Z7901 Long term (current) use of anticoagulants: Secondary | ICD-10-CM | POA: Diagnosis not present

## 2017-01-11 DIAGNOSIS — N183 Chronic kidney disease, stage 3 (moderate): Secondary | ICD-10-CM | POA: Diagnosis not present

## 2017-01-11 DIAGNOSIS — Z794 Long term (current) use of insulin: Secondary | ICD-10-CM | POA: Diagnosis not present

## 2017-01-11 DIAGNOSIS — E11621 Type 2 diabetes mellitus with foot ulcer: Secondary | ICD-10-CM | POA: Diagnosis not present

## 2017-01-11 DIAGNOSIS — Z89422 Acquired absence of other left toe(s): Secondary | ICD-10-CM | POA: Diagnosis not present

## 2017-01-11 DIAGNOSIS — I129 Hypertensive chronic kidney disease with stage 1 through stage 4 chronic kidney disease, or unspecified chronic kidney disease: Secondary | ICD-10-CM | POA: Diagnosis not present

## 2017-01-11 DIAGNOSIS — E1142 Type 2 diabetes mellitus with diabetic polyneuropathy: Secondary | ICD-10-CM | POA: Diagnosis not present

## 2017-01-11 DIAGNOSIS — Z4800 Encounter for change or removal of nonsurgical wound dressing: Secondary | ICD-10-CM | POA: Diagnosis not present

## 2017-01-11 DIAGNOSIS — E11311 Type 2 diabetes mellitus with unspecified diabetic retinopathy with macular edema: Secondary | ICD-10-CM | POA: Diagnosis not present

## 2017-01-11 DIAGNOSIS — E1122 Type 2 diabetes mellitus with diabetic chronic kidney disease: Secondary | ICD-10-CM | POA: Diagnosis not present

## 2017-01-11 NOTE — Progress Notes (Signed)
[name] is a 65 y.o. male who was reviewed for apixaban (Eliquis) therapy.    ASSESSMENT Indication(s): atrial fibrillation CHA2DS2VASC score 2, HAS-BLED 2 Duration: indefinite  Labs: Component Value Date/Time   AST 13 04/13/2016 0957   ALT 13 04/13/2016 0957   NA 137 10/22/2016 0959   K 4.0 10/22/2016 0959   CL 104 10/22/2016 0959   CO2 28 10/22/2016 0959   GLUCOSE 152 (H) 10/22/2016 0959   HGBA1C 9.2 (H) 10/22/2016 0959   BUN 15 10/22/2016 0959   CREATININE 1.55 (H) 10/22/2016 0959   CREATININE 1.61 (H) 12/20/2014 1133   CALCIUM 9.1 10/22/2016 0959   GFRAA 41 (L) 06/12/2016 1551   GFRAA 51 (L) 12/20/2014 1133   WBC 3.9 04/16/2015 1037   WBC 7.0 12/16/2014 0345   HGB 11.3 (L) 04/16/2015 1037   HCT 36.0 (L) 04/16/2015 1037   PLT 136 (L) 04/16/2015 1037   apixaban (Eliquis) Dose: 5 mg BID  No concerns recommendations at this time, pharmacy records indicate consistent refills  [place] Pharmacist  01/11/2017, 5:54 PM

## 2017-01-12 ENCOUNTER — Encounter: Payer: Self-pay | Admitting: Internal Medicine

## 2017-01-12 ENCOUNTER — Ambulatory Visit (INDEPENDENT_AMBULATORY_CARE_PROVIDER_SITE_OTHER): Payer: Medicare Other | Admitting: Internal Medicine

## 2017-01-12 VITALS — BP 132/68 | HR 69 | Temp 98.3°F | Ht 77.5 in | Wt 394.8 lb

## 2017-01-12 DIAGNOSIS — N183 Chronic kidney disease, stage 3 unspecified: Secondary | ICD-10-CM

## 2017-01-12 DIAGNOSIS — Z0000 Encounter for general adult medical examination without abnormal findings: Secondary | ICD-10-CM

## 2017-01-12 DIAGNOSIS — I480 Paroxysmal atrial fibrillation: Secondary | ICD-10-CM

## 2017-01-12 DIAGNOSIS — Z7901 Long term (current) use of anticoagulants: Secondary | ICD-10-CM

## 2017-01-12 DIAGNOSIS — Z89422 Acquired absence of other left toe(s): Secondary | ICD-10-CM | POA: Diagnosis not present

## 2017-01-12 DIAGNOSIS — Z794 Long term (current) use of insulin: Secondary | ICD-10-CM

## 2017-01-12 DIAGNOSIS — I129 Hypertensive chronic kidney disease with stage 1 through stage 4 chronic kidney disease, or unspecified chronic kidney disease: Secondary | ICD-10-CM | POA: Diagnosis not present

## 2017-01-12 DIAGNOSIS — E1165 Type 2 diabetes mellitus with hyperglycemia: Secondary | ICD-10-CM | POA: Diagnosis not present

## 2017-01-12 DIAGNOSIS — E11311 Type 2 diabetes mellitus with unspecified diabetic retinopathy with macular edema: Secondary | ICD-10-CM | POA: Diagnosis not present

## 2017-01-12 DIAGNOSIS — Z6841 Body Mass Index (BMI) 40.0 and over, adult: Secondary | ICD-10-CM

## 2017-01-12 DIAGNOSIS — I10 Essential (primary) hypertension: Secondary | ICD-10-CM

## 2017-01-12 DIAGNOSIS — Z4800 Encounter for change or removal of nonsurgical wound dressing: Secondary | ICD-10-CM | POA: Diagnosis not present

## 2017-01-12 DIAGNOSIS — E11621 Type 2 diabetes mellitus with foot ulcer: Secondary | ICD-10-CM | POA: Diagnosis not present

## 2017-01-12 DIAGNOSIS — L97529 Non-pressure chronic ulcer of other part of left foot with unspecified severity: Secondary | ICD-10-CM

## 2017-01-12 DIAGNOSIS — E1122 Type 2 diabetes mellitus with diabetic chronic kidney disease: Secondary | ICD-10-CM | POA: Diagnosis not present

## 2017-01-12 DIAGNOSIS — E1142 Type 2 diabetes mellitus with diabetic polyneuropathy: Secondary | ICD-10-CM | POA: Diagnosis not present

## 2017-01-12 DIAGNOSIS — G894 Chronic pain syndrome: Secondary | ICD-10-CM

## 2017-01-12 DIAGNOSIS — Z87891 Personal history of nicotine dependence: Secondary | ICD-10-CM

## 2017-01-12 DIAGNOSIS — L97522 Non-pressure chronic ulcer of other part of left foot with fat layer exposed: Secondary | ICD-10-CM | POA: Diagnosis not present

## 2017-01-12 DIAGNOSIS — Z79899 Other long term (current) drug therapy: Secondary | ICD-10-CM

## 2017-01-12 DIAGNOSIS — IMO0002 Reserved for concepts with insufficient information to code with codable children: Secondary | ICD-10-CM

## 2017-01-12 DIAGNOSIS — E559 Vitamin D deficiency, unspecified: Secondary | ICD-10-CM

## 2017-01-12 LAB — GLUCOSE, CAPILLARY: Glucose-Capillary: 133 mg/dL — ABNORMAL HIGH (ref 65–99)

## 2017-01-12 MED ORDER — PROMETHAZINE HCL 25 MG PO TABS: 25.0000 mg | ORAL_TABLET | Freq: Three times a day (TID) | ORAL | 2 refills | Status: DC | PRN

## 2017-01-12 NOTE — Assessment & Plan Note (Signed)
-   Patient is now established at a pain clinic with Dr. Mirna Mires - He states that he is comfortable with him and will receive all his pain medications from there - We will no longer prescribe any opiates at this clinic for him - Patient states that his pain is well-controlled on his current regimen with Dr. Mirna Mires

## 2017-01-12 NOTE — Assessment & Plan Note (Addendum)
-   Patient follows with Dr. Dwyane Dee for his diabetes - His meter shows an average blood sugar of 165 with a high of 311 and a low of 60 - Patient was encouraged to follow-up with his eye doctor for an exam. He is in the process of switching retinal doctors. - Continue with current insulin regimen and Victoza - He may start using Vigo to better get his blood sugars under control - Diabetic foot exam done today

## 2017-01-12 NOTE — Assessment & Plan Note (Addendum)
-   Patient has a history of vitamin D deficiency and is taking cholecalciferol 2000 units daily - We'll recheck vitamin D level today

## 2017-01-12 NOTE — Progress Notes (Signed)
   Subjective:    Patient ID: [name], male    DOB: 1950/08/06, 66 y.o.   MRN: [id number]  HPI I have seen and examined this patient. Patient is here for routine follow-up of his diabetes and hypertension.  Patient has had left diabetic foot ulcer for which he has been following with podiatry over the last several months. He states that the ulcer is slowly healing and that he is receiving daily dressing changes for this. He has no other complaints at this time and is compliant with his medications   Review of Systems  Constitutional: Negative.   HENT: Negative.   Respiratory: Negative.   Cardiovascular: Negative.   Gastrointestinal: Negative.   Musculoskeletal: Negative.   Skin: Positive for wound.       Left diabetic foot ulcer  Neurological: Negative.   Psychiatric/Behavioral: Negative.        Objective:   Physical Exam  Constitutional: He is oriented to person, place, and time. He appears well-developed and well-nourished.  HENT:  Head: Normocephalic and atraumatic.  Mouth/Throat: No oropharyngeal exudate.  Neck: Neck supple.  Cardiovascular: Normal rate, regular rhythm and normal heart sounds.   Pulmonary/Chest: Effort normal and breath sounds normal. No respiratory distress. He has no wheezes.  Abdominal: Soft. Bowel sounds are normal. He exhibits no distension. There is no tenderness.  Musculoskeletal: Normal range of motion. He exhibits edema. He exhibits no tenderness.  Trace bilateral lower extremity edema noted  Lymphadenopathy:    He has no cervical adenopathy.  Neurological: He is alert and oriented to person, place, and time.  Skin: Skin is warm. No rash noted. No erythema.  Left foot dressing intact  Psychiatric: He has a normal mood and affect. His behavior is normal.          Assessment & Plan:  Please see problem based charting for assessment and plan:

## 2017-01-12 NOTE — Assessment & Plan Note (Signed)
-   Patient follows regularly with podiatry for his left foot ulcer - He is now receiving daily dressing changes - Patient states that the ulcer is slowly healing and denies any purulent discharge or pain at site - He will follow-up with podiatry next week

## 2017-01-12 NOTE — Assessment & Plan Note (Signed)
-   This problem is chronic and stable - We'll continue with Eliquis and metoprolol - He appears to be in sinus today on exam

## 2017-01-12 NOTE — Assessment & Plan Note (Signed)
-   Discussed importance of weight loss with patient again today - We set a weight loss goal of 5-7 pounds over the next 3 months - Patient will attempt to have a better diet and is meeting with the nutritionist in the next couple of weeks - He will also attempt to exercise more now that his left foot ulcer is healing

## 2017-01-12 NOTE — Assessment & Plan Note (Signed)
-   We'll check hepatitis C antibody today

## 2017-01-12 NOTE — Assessment & Plan Note (Signed)
BP Readings from Last 3 Encounters:  01/12/17 132/68  01/05/17 (!) 154/90  01/04/17 (!) 142/86    Lab Results  Component Value Date   NA 137 10/22/2016   K 4.0 10/22/2016   CREATININE 1.55 (H) 10/22/2016    Assessment: Blood pressure control:  well-controlled Progress toward BP goal:   at goal Comments: Continue with enalapril 20 mg, Lasix 40 mg and metoprolol 25 mg  Plan: Medications:  continue current medications Educational resources provided: brochure (denies need ) Self management tools provided:   Other plans: We'll check BMP today

## 2017-01-12 NOTE — Assessment & Plan Note (Signed)
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

## 2017-01-12 NOTE — Patient Instructions (Addendum)
-   It was a pleasure seeing you today - We will check some blood work on you today - Please follow up with Dr. Dwyane Dee for your diabetes - Please start an exercise and diet regimen. I would like to see a weight loss of at least 5 lbs before your next appointment - Please call me with any questions

## 2017-01-13 DIAGNOSIS — E1122 Type 2 diabetes mellitus with diabetic chronic kidney disease: Secondary | ICD-10-CM | POA: Diagnosis not present

## 2017-01-13 DIAGNOSIS — E11621 Type 2 diabetes mellitus with foot ulcer: Secondary | ICD-10-CM | POA: Diagnosis not present

## 2017-01-13 DIAGNOSIS — L97522 Non-pressure chronic ulcer of other part of left foot with fat layer exposed: Secondary | ICD-10-CM | POA: Diagnosis not present

## 2017-01-13 DIAGNOSIS — I129 Hypertensive chronic kidney disease with stage 1 through stage 4 chronic kidney disease, or unspecified chronic kidney disease: Secondary | ICD-10-CM | POA: Diagnosis not present

## 2017-01-13 DIAGNOSIS — E1142 Type 2 diabetes mellitus with diabetic polyneuropathy: Secondary | ICD-10-CM | POA: Diagnosis not present

## 2017-01-13 DIAGNOSIS — N183 Chronic kidney disease, stage 3 (moderate): Secondary | ICD-10-CM | POA: Diagnosis not present

## 2017-01-13 DIAGNOSIS — Z7901 Long term (current) use of anticoagulants: Secondary | ICD-10-CM | POA: Diagnosis not present

## 2017-01-13 DIAGNOSIS — Z794 Long term (current) use of insulin: Secondary | ICD-10-CM | POA: Diagnosis not present

## 2017-01-13 DIAGNOSIS — E11311 Type 2 diabetes mellitus with unspecified diabetic retinopathy with macular edema: Secondary | ICD-10-CM | POA: Diagnosis not present

## 2017-01-13 DIAGNOSIS — Z89422 Acquired absence of other left toe(s): Secondary | ICD-10-CM | POA: Diagnosis not present

## 2017-01-13 DIAGNOSIS — Z4800 Encounter for change or removal of nonsurgical wound dressing: Secondary | ICD-10-CM | POA: Diagnosis not present

## 2017-01-13 LAB — BMP8+ANION GAP
ANION GAP: 16 mmol/L (ref 10.0–18.0)
BUN/Creatinine Ratio: 14 (ref 10–24)
BUN: 24 mg/dL (ref 8–27)
CHLORIDE: 101 mmol/L (ref 96–106)
CO2: 24 mmol/L (ref 20–29)
Calcium: 9.1 mg/dL (ref 8.6–10.2)
Creatinine, Ser: 1.66 mg/dL — ABNORMAL HIGH (ref 0.76–1.27)
GFR calc Af Amer: 49 mL/min/{1.73_m2} — ABNORMAL LOW (ref >59)
GFR calc non Af Amer: 42 mL/min/{1.73_m2} — ABNORMAL LOW (ref >59)
Glucose: 120 mg/dL — ABNORMAL HIGH (ref 65–99)
POTASSIUM: 4.3 mmol/L (ref 3.5–5.2)
SODIUM: 141 mmol/L (ref 134–144)

## 2017-01-13 LAB — HEPATITIS C ANTIBODY

## 2017-01-13 LAB — VITAMIN D 25 HYDROXY (VIT D DEFICIENCY, FRACTURES): VIT D 25 HYDROXY: 30.6 ng/mL (ref 30.0–100.0)

## 2017-01-14 ENCOUNTER — Encounter: Payer: Self-pay | Admitting: Internal Medicine

## 2017-01-14 DIAGNOSIS — E1142 Type 2 diabetes mellitus with diabetic polyneuropathy: Secondary | ICD-10-CM | POA: Diagnosis not present

## 2017-01-14 DIAGNOSIS — Z89422 Acquired absence of other left toe(s): Secondary | ICD-10-CM | POA: Diagnosis not present

## 2017-01-14 DIAGNOSIS — I129 Hypertensive chronic kidney disease with stage 1 through stage 4 chronic kidney disease, or unspecified chronic kidney disease: Secondary | ICD-10-CM | POA: Diagnosis not present

## 2017-01-14 DIAGNOSIS — E1122 Type 2 diabetes mellitus with diabetic chronic kidney disease: Secondary | ICD-10-CM | POA: Diagnosis not present

## 2017-01-14 DIAGNOSIS — N183 Chronic kidney disease, stage 3 (moderate): Secondary | ICD-10-CM | POA: Diagnosis not present

## 2017-01-14 DIAGNOSIS — E11621 Type 2 diabetes mellitus with foot ulcer: Secondary | ICD-10-CM | POA: Diagnosis not present

## 2017-01-14 DIAGNOSIS — G4733 Obstructive sleep apnea (adult) (pediatric): Secondary | ICD-10-CM | POA: Diagnosis not present

## 2017-01-14 DIAGNOSIS — E11311 Type 2 diabetes mellitus with unspecified diabetic retinopathy with macular edema: Secondary | ICD-10-CM | POA: Diagnosis not present

## 2017-01-14 DIAGNOSIS — Z794 Long term (current) use of insulin: Secondary | ICD-10-CM | POA: Diagnosis not present

## 2017-01-14 DIAGNOSIS — L97522 Non-pressure chronic ulcer of other part of left foot with fat layer exposed: Secondary | ICD-10-CM | POA: Diagnosis not present

## 2017-01-14 DIAGNOSIS — Z4800 Encounter for change or removal of nonsurgical wound dressing: Secondary | ICD-10-CM | POA: Diagnosis not present

## 2017-01-14 DIAGNOSIS — Z7901 Long term (current) use of anticoagulants: Secondary | ICD-10-CM | POA: Diagnosis not present

## 2017-01-15 DIAGNOSIS — Z7901 Long term (current) use of anticoagulants: Secondary | ICD-10-CM | POA: Diagnosis not present

## 2017-01-15 DIAGNOSIS — E11621 Type 2 diabetes mellitus with foot ulcer: Secondary | ICD-10-CM | POA: Diagnosis not present

## 2017-01-15 DIAGNOSIS — E11311 Type 2 diabetes mellitus with unspecified diabetic retinopathy with macular edema: Secondary | ICD-10-CM | POA: Diagnosis not present

## 2017-01-15 DIAGNOSIS — Z4800 Encounter for change or removal of nonsurgical wound dressing: Secondary | ICD-10-CM | POA: Diagnosis not present

## 2017-01-15 DIAGNOSIS — E1122 Type 2 diabetes mellitus with diabetic chronic kidney disease: Secondary | ICD-10-CM | POA: Diagnosis not present

## 2017-01-15 DIAGNOSIS — N183 Chronic kidney disease, stage 3 (moderate): Secondary | ICD-10-CM | POA: Diagnosis not present

## 2017-01-15 DIAGNOSIS — Z794 Long term (current) use of insulin: Secondary | ICD-10-CM | POA: Diagnosis not present

## 2017-01-15 DIAGNOSIS — L97522 Non-pressure chronic ulcer of other part of left foot with fat layer exposed: Secondary | ICD-10-CM | POA: Diagnosis not present

## 2017-01-15 DIAGNOSIS — I129 Hypertensive chronic kidney disease with stage 1 through stage 4 chronic kidney disease, or unspecified chronic kidney disease: Secondary | ICD-10-CM | POA: Diagnosis not present

## 2017-01-15 DIAGNOSIS — E1142 Type 2 diabetes mellitus with diabetic polyneuropathy: Secondary | ICD-10-CM | POA: Diagnosis not present

## 2017-01-15 DIAGNOSIS — Z89422 Acquired absence of other left toe(s): Secondary | ICD-10-CM | POA: Diagnosis not present

## 2017-01-16 DIAGNOSIS — E1122 Type 2 diabetes mellitus with diabetic chronic kidney disease: Secondary | ICD-10-CM | POA: Diagnosis not present

## 2017-01-16 DIAGNOSIS — Z794 Long term (current) use of insulin: Secondary | ICD-10-CM | POA: Diagnosis not present

## 2017-01-16 DIAGNOSIS — E11311 Type 2 diabetes mellitus with unspecified diabetic retinopathy with macular edema: Secondary | ICD-10-CM | POA: Diagnosis not present

## 2017-01-16 DIAGNOSIS — E1142 Type 2 diabetes mellitus with diabetic polyneuropathy: Secondary | ICD-10-CM | POA: Diagnosis not present

## 2017-01-16 DIAGNOSIS — Z4800 Encounter for change or removal of nonsurgical wound dressing: Secondary | ICD-10-CM | POA: Diagnosis not present

## 2017-01-16 DIAGNOSIS — E11621 Type 2 diabetes mellitus with foot ulcer: Secondary | ICD-10-CM | POA: Diagnosis not present

## 2017-01-16 DIAGNOSIS — I129 Hypertensive chronic kidney disease with stage 1 through stage 4 chronic kidney disease, or unspecified chronic kidney disease: Secondary | ICD-10-CM | POA: Diagnosis not present

## 2017-01-16 DIAGNOSIS — Z89422 Acquired absence of other left toe(s): Secondary | ICD-10-CM | POA: Diagnosis not present

## 2017-01-16 DIAGNOSIS — Z7901 Long term (current) use of anticoagulants: Secondary | ICD-10-CM | POA: Diagnosis not present

## 2017-01-16 DIAGNOSIS — H578 Other specified disorders of eye and adnexa: Secondary | ICD-10-CM | POA: Diagnosis not present

## 2017-01-16 DIAGNOSIS — L97522 Non-pressure chronic ulcer of other part of left foot with fat layer exposed: Secondary | ICD-10-CM | POA: Diagnosis not present

## 2017-01-16 DIAGNOSIS — N183 Chronic kidney disease, stage 3 (moderate): Secondary | ICD-10-CM | POA: Diagnosis not present

## 2017-01-17 DIAGNOSIS — Z89422 Acquired absence of other left toe(s): Secondary | ICD-10-CM | POA: Diagnosis not present

## 2017-01-17 DIAGNOSIS — N183 Chronic kidney disease, stage 3 (moderate): Secondary | ICD-10-CM | POA: Diagnosis not present

## 2017-01-17 DIAGNOSIS — Z4800 Encounter for change or removal of nonsurgical wound dressing: Secondary | ICD-10-CM | POA: Diagnosis not present

## 2017-01-17 DIAGNOSIS — E11311 Type 2 diabetes mellitus with unspecified diabetic retinopathy with macular edema: Secondary | ICD-10-CM | POA: Diagnosis not present

## 2017-01-17 DIAGNOSIS — E11621 Type 2 diabetes mellitus with foot ulcer: Secondary | ICD-10-CM | POA: Diagnosis not present

## 2017-01-17 DIAGNOSIS — Z794 Long term (current) use of insulin: Secondary | ICD-10-CM | POA: Diagnosis not present

## 2017-01-17 DIAGNOSIS — E1122 Type 2 diabetes mellitus with diabetic chronic kidney disease: Secondary | ICD-10-CM | POA: Diagnosis not present

## 2017-01-17 DIAGNOSIS — Z7901 Long term (current) use of anticoagulants: Secondary | ICD-10-CM | POA: Diagnosis not present

## 2017-01-17 DIAGNOSIS — L97522 Non-pressure chronic ulcer of other part of left foot with fat layer exposed: Secondary | ICD-10-CM | POA: Diagnosis not present

## 2017-01-17 DIAGNOSIS — I129 Hypertensive chronic kidney disease with stage 1 through stage 4 chronic kidney disease, or unspecified chronic kidney disease: Secondary | ICD-10-CM | POA: Diagnosis not present

## 2017-01-17 DIAGNOSIS — E1142 Type 2 diabetes mellitus with diabetic polyneuropathy: Secondary | ICD-10-CM | POA: Diagnosis not present

## 2017-01-18 DIAGNOSIS — L97522 Non-pressure chronic ulcer of other part of left foot with fat layer exposed: Secondary | ICD-10-CM | POA: Diagnosis not present

## 2017-01-18 DIAGNOSIS — Z7901 Long term (current) use of anticoagulants: Secondary | ICD-10-CM | POA: Diagnosis not present

## 2017-01-18 DIAGNOSIS — Z4800 Encounter for change or removal of nonsurgical wound dressing: Secondary | ICD-10-CM | POA: Diagnosis not present

## 2017-01-18 DIAGNOSIS — N183 Chronic kidney disease, stage 3 (moderate): Secondary | ICD-10-CM | POA: Diagnosis not present

## 2017-01-18 DIAGNOSIS — E1122 Type 2 diabetes mellitus with diabetic chronic kidney disease: Secondary | ICD-10-CM | POA: Diagnosis not present

## 2017-01-18 DIAGNOSIS — Z89422 Acquired absence of other left toe(s): Secondary | ICD-10-CM | POA: Diagnosis not present

## 2017-01-18 DIAGNOSIS — E11621 Type 2 diabetes mellitus with foot ulcer: Secondary | ICD-10-CM | POA: Diagnosis not present

## 2017-01-18 DIAGNOSIS — Z794 Long term (current) use of insulin: Secondary | ICD-10-CM | POA: Diagnosis not present

## 2017-01-18 DIAGNOSIS — I129 Hypertensive chronic kidney disease with stage 1 through stage 4 chronic kidney disease, or unspecified chronic kidney disease: Secondary | ICD-10-CM | POA: Diagnosis not present

## 2017-01-18 DIAGNOSIS — E11311 Type 2 diabetes mellitus with unspecified diabetic retinopathy with macular edema: Secondary | ICD-10-CM | POA: Diagnosis not present

## 2017-01-18 DIAGNOSIS — E1142 Type 2 diabetes mellitus with diabetic polyneuropathy: Secondary | ICD-10-CM | POA: Diagnosis not present

## 2017-01-19 ENCOUNTER — Ambulatory Visit (INDEPENDENT_AMBULATORY_CARE_PROVIDER_SITE_OTHER): Payer: Medicare Other | Admitting: Podiatry

## 2017-01-19 ENCOUNTER — Encounter: Payer: Self-pay | Admitting: Podiatry

## 2017-01-19 VITALS — BP 159/99 | HR 61 | Temp 96.9°F

## 2017-01-19 DIAGNOSIS — L97522 Non-pressure chronic ulcer of other part of left foot with fat layer exposed: Secondary | ICD-10-CM | POA: Diagnosis not present

## 2017-01-19 NOTE — Patient Instructions (Signed)
Continue wound care as prescribed outpatient with daily application of Regranex gel and gauze dressing Maintain surgical wedge shoe on left foot 3 notice any sudden increase in pain, swelling, redness, fever present to emergency department  Diabetes and Foot Care Diabetes may cause you to have problems because of poor blood supply (circulation) to your feet and legs. This may cause the skin on your feet to become thinner, break easier, and heal more slowly. Your skin may become dry, and the skin may peel and crack. You may also have nerve damage in your legs and feet causing decreased feeling in them. You may not notice minor injuries to your feet that could lead to infections or more serious problems. Taking care of your feet is one of the most important things you can do for yourself. Follow these instructions at home:  Wear shoes at all times, even in the house. Do not go barefoot. Bare feet are easily injured.  Check your feet daily for blisters, cuts, and redness. If you cannot see the bottom of your feet, use a mirror or ask someone for help.  Wash your feet with warm water (do not use hot water) and mild soap. Then pat your feet and the areas between your toes until they are completely dry. Do not soak your feet as this can dry your skin.  Apply a moisturizing lotion or petroleum jelly (that does not contain alcohol and is unscented) to the skin on your feet and to dry, brittle toenails. Do not apply lotion between your toes.  Trim your toenails straight across. Do not dig under them or around the cuticle. File the edges of your nails with an emery board or nail file.  Do not cut corns or calluses or try to remove them with medicine.  Wear clean socks or stockings every day. Make sure they are not too tight. Do not wear knee-high stockings since they may decrease blood flow to your legs.  Wear shoes that fit properly and have enough cushioning. To break in new shoes, wear them for just a  few hours a day. This prevents you from injuring your feet. Always look in your shoes before you put them on to be sure there are no objects inside.  Do not cross your legs. This may decrease the blood flow to your feet.  If you find a minor scrape, cut, or break in the skin on your feet, keep it and the skin around it clean and dry. These areas may be cleansed with mild soap and water. Do not cleanse the area with peroxide, alcohol, or iodine.  When you remove an adhesive bandage, be sure not to damage the skin around it.  If you have a wound, look at it several times a day to make sure it is healing.  Do not use heating pads or hot water bottles. They may burn your skin. If you have lost feeling in your feet or legs, you may not know it is happening until it is too late.  Make sure your health care provider performs a complete foot exam at least annually or more often if you have foot problems. Report any cuts, sores, or bruises to your health care provider immediately. Contact a health care provider if:  You have an injury that is not healing.  You have cuts or breaks in the skin.  You have an ingrown nail.  You notice redness on your legs or feet.  You feel burning or tingling in  your legs or feet.  You have pain or cramps in your legs and feet.  Your legs or feet are numb.  Your feet always feel cold. Get help right away if:  There is increasing redness, swelling, or pain in or around a wound.  There is a red line that goes up your leg.  Pus is coming from a wound.  You develop a fever or as directed by your health care provider.  You notice a bad smell coming from an ulcer or wound. This information is not intended to replace advice given to you by your health care provider. Make sure you discuss any questions you have with your health care provider. Document Released: 06/26/2000 Document Revised: 12/05/2015 Document Reviewed: 12/06/2012 Elsevier Interactive Patient  Education  2017 [employer].

## 2017-01-19 NOTE — Progress Notes (Signed)
Patient ID: [name], male   DOB: 02/16/1951, 66 y.o.   MRN: [id number]    Subjective: This patient presents today for ongoing care for diabetic plantar skin ulcer. Patient under care of Dr. Cannon Kettle. Patient currently applying reranex to the wound daily and offloading with the Darco wedge shoe  Objective: Temperature 96.9 Fahrenheit BP 155/99 Pulse 61 Orientated 3 Amputated second and third left toes Plantar wound measures 10 x 20 mm with a granular base surrounded by hyperkeratotic tissue. There is no surrounding erythema, edema, warmth. The wound does not probe deep.  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 Cannon Kettle

## 2017-01-20 ENCOUNTER — Telehealth: Payer: Self-pay | Admitting: Podiatry

## 2017-01-20 NOTE — Telephone Encounter (Signed)
This is Radio producer calling from [place] at [facility]. We need updated orders to continue with his daily wound care for his left foot. If you could please fax those to [phone] Attn Lenna Sciara (that is my supervisor.) If you have any questions, you can call me at [phone]. Thank you.

## 2017-01-20 NOTE — Telephone Encounter (Signed)
Faxed handwritten copy of 01/19/2017 orders from Dr. Phoebe Perch PLAN.

## 2017-01-21 DIAGNOSIS — N183 Chronic kidney disease, stage 3 (moderate): Secondary | ICD-10-CM | POA: Diagnosis not present

## 2017-01-21 DIAGNOSIS — E1142 Type 2 diabetes mellitus with diabetic polyneuropathy: Secondary | ICD-10-CM | POA: Diagnosis not present

## 2017-01-21 DIAGNOSIS — Z794 Long term (current) use of insulin: Secondary | ICD-10-CM | POA: Diagnosis not present

## 2017-01-21 DIAGNOSIS — E11311 Type 2 diabetes mellitus with unspecified diabetic retinopathy with macular edema: Secondary | ICD-10-CM | POA: Diagnosis not present

## 2017-01-21 DIAGNOSIS — E11621 Type 2 diabetes mellitus with foot ulcer: Secondary | ICD-10-CM | POA: Diagnosis not present

## 2017-01-21 DIAGNOSIS — Z89422 Acquired absence of other left toe(s): Secondary | ICD-10-CM | POA: Diagnosis not present

## 2017-01-21 DIAGNOSIS — I129 Hypertensive chronic kidney disease with stage 1 through stage 4 chronic kidney disease, or unspecified chronic kidney disease: Secondary | ICD-10-CM | POA: Diagnosis not present

## 2017-01-21 DIAGNOSIS — Z7901 Long term (current) use of anticoagulants: Secondary | ICD-10-CM | POA: Diagnosis not present

## 2017-01-21 DIAGNOSIS — L97522 Non-pressure chronic ulcer of other part of left foot with fat layer exposed: Secondary | ICD-10-CM | POA: Diagnosis not present

## 2017-01-21 DIAGNOSIS — E113512 Type 2 diabetes mellitus with proliferative diabetic retinopathy with macular edema, left eye: Secondary | ICD-10-CM | POA: Diagnosis not present

## 2017-01-21 DIAGNOSIS — E1122 Type 2 diabetes mellitus with diabetic chronic kidney disease: Secondary | ICD-10-CM | POA: Diagnosis not present

## 2017-01-21 DIAGNOSIS — Z4800 Encounter for change or removal of nonsurgical wound dressing: Secondary | ICD-10-CM | POA: Diagnosis not present

## 2017-01-22 DIAGNOSIS — L97522 Non-pressure chronic ulcer of other part of left foot with fat layer exposed: Secondary | ICD-10-CM | POA: Diagnosis not present

## 2017-01-22 DIAGNOSIS — N183 Chronic kidney disease, stage 3 (moderate): Secondary | ICD-10-CM | POA: Diagnosis not present

## 2017-01-22 DIAGNOSIS — Z794 Long term (current) use of insulin: Secondary | ICD-10-CM | POA: Diagnosis not present

## 2017-01-22 DIAGNOSIS — E1122 Type 2 diabetes mellitus with diabetic chronic kidney disease: Secondary | ICD-10-CM | POA: Diagnosis not present

## 2017-01-22 DIAGNOSIS — E1142 Type 2 diabetes mellitus with diabetic polyneuropathy: Secondary | ICD-10-CM | POA: Diagnosis not present

## 2017-01-22 DIAGNOSIS — I129 Hypertensive chronic kidney disease with stage 1 through stage 4 chronic kidney disease, or unspecified chronic kidney disease: Secondary | ICD-10-CM | POA: Diagnosis not present

## 2017-01-22 DIAGNOSIS — Z7901 Long term (current) use of anticoagulants: Secondary | ICD-10-CM | POA: Diagnosis not present

## 2017-01-22 DIAGNOSIS — E11311 Type 2 diabetes mellitus with unspecified diabetic retinopathy with macular edema: Secondary | ICD-10-CM | POA: Diagnosis not present

## 2017-01-22 DIAGNOSIS — Z4800 Encounter for change or removal of nonsurgical wound dressing: Secondary | ICD-10-CM | POA: Diagnosis not present

## 2017-01-22 DIAGNOSIS — Z89422 Acquired absence of other left toe(s): Secondary | ICD-10-CM | POA: Diagnosis not present

## 2017-01-22 DIAGNOSIS — E11621 Type 2 diabetes mellitus with foot ulcer: Secondary | ICD-10-CM | POA: Diagnosis not present

## 2017-01-23 DIAGNOSIS — L97522 Non-pressure chronic ulcer of other part of left foot with fat layer exposed: Secondary | ICD-10-CM | POA: Diagnosis not present

## 2017-01-23 DIAGNOSIS — Z4800 Encounter for change or removal of nonsurgical wound dressing: Secondary | ICD-10-CM | POA: Diagnosis not present

## 2017-01-23 DIAGNOSIS — I129 Hypertensive chronic kidney disease with stage 1 through stage 4 chronic kidney disease, or unspecified chronic kidney disease: Secondary | ICD-10-CM | POA: Diagnosis not present

## 2017-01-23 DIAGNOSIS — Z7901 Long term (current) use of anticoagulants: Secondary | ICD-10-CM | POA: Diagnosis not present

## 2017-01-23 DIAGNOSIS — Z794 Long term (current) use of insulin: Secondary | ICD-10-CM | POA: Diagnosis not present

## 2017-01-23 DIAGNOSIS — E11621 Type 2 diabetes mellitus with foot ulcer: Secondary | ICD-10-CM | POA: Diagnosis not present

## 2017-01-23 DIAGNOSIS — Z89422 Acquired absence of other left toe(s): Secondary | ICD-10-CM | POA: Diagnosis not present

## 2017-01-23 DIAGNOSIS — N183 Chronic kidney disease, stage 3 (moderate): Secondary | ICD-10-CM | POA: Diagnosis not present

## 2017-01-23 DIAGNOSIS — E1122 Type 2 diabetes mellitus with diabetic chronic kidney disease: Secondary | ICD-10-CM | POA: Diagnosis not present

## 2017-01-23 DIAGNOSIS — E1142 Type 2 diabetes mellitus with diabetic polyneuropathy: Secondary | ICD-10-CM | POA: Diagnosis not present

## 2017-01-23 DIAGNOSIS — E11311 Type 2 diabetes mellitus with unspecified diabetic retinopathy with macular edema: Secondary | ICD-10-CM | POA: Diagnosis not present

## 2017-01-24 DIAGNOSIS — E11621 Type 2 diabetes mellitus with foot ulcer: Secondary | ICD-10-CM | POA: Diagnosis not present

## 2017-01-24 DIAGNOSIS — Z794 Long term (current) use of insulin: Secondary | ICD-10-CM | POA: Diagnosis not present

## 2017-01-24 DIAGNOSIS — Z89422 Acquired absence of other left toe(s): Secondary | ICD-10-CM | POA: Diagnosis not present

## 2017-01-24 DIAGNOSIS — I129 Hypertensive chronic kidney disease with stage 1 through stage 4 chronic kidney disease, or unspecified chronic kidney disease: Secondary | ICD-10-CM | POA: Diagnosis not present

## 2017-01-24 DIAGNOSIS — Z4800 Encounter for change or removal of nonsurgical wound dressing: Secondary | ICD-10-CM | POA: Diagnosis not present

## 2017-01-24 DIAGNOSIS — E1142 Type 2 diabetes mellitus with diabetic polyneuropathy: Secondary | ICD-10-CM | POA: Diagnosis not present

## 2017-01-24 DIAGNOSIS — E1122 Type 2 diabetes mellitus with diabetic chronic kidney disease: Secondary | ICD-10-CM | POA: Diagnosis not present

## 2017-01-24 DIAGNOSIS — E11311 Type 2 diabetes mellitus with unspecified diabetic retinopathy with macular edema: Secondary | ICD-10-CM | POA: Diagnosis not present

## 2017-01-24 DIAGNOSIS — N183 Chronic kidney disease, stage 3 (moderate): Secondary | ICD-10-CM | POA: Diagnosis not present

## 2017-01-24 DIAGNOSIS — Z7901 Long term (current) use of anticoagulants: Secondary | ICD-10-CM | POA: Diagnosis not present

## 2017-01-24 DIAGNOSIS — L97522 Non-pressure chronic ulcer of other part of left foot with fat layer exposed: Secondary | ICD-10-CM | POA: Diagnosis not present

## 2017-01-25 DIAGNOSIS — I129 Hypertensive chronic kidney disease with stage 1 through stage 4 chronic kidney disease, or unspecified chronic kidney disease: Secondary | ICD-10-CM | POA: Diagnosis not present

## 2017-01-25 DIAGNOSIS — N183 Chronic kidney disease, stage 3 (moderate): Secondary | ICD-10-CM | POA: Diagnosis not present

## 2017-01-25 DIAGNOSIS — L97522 Non-pressure chronic ulcer of other part of left foot with fat layer exposed: Secondary | ICD-10-CM | POA: Diagnosis not present

## 2017-01-25 DIAGNOSIS — G894 Chronic pain syndrome: Secondary | ICD-10-CM | POA: Diagnosis not present

## 2017-01-25 DIAGNOSIS — E1142 Type 2 diabetes mellitus with diabetic polyneuropathy: Secondary | ICD-10-CM | POA: Diagnosis not present

## 2017-01-25 DIAGNOSIS — E11621 Type 2 diabetes mellitus with foot ulcer: Secondary | ICD-10-CM | POA: Diagnosis not present

## 2017-01-25 DIAGNOSIS — Z7901 Long term (current) use of anticoagulants: Secondary | ICD-10-CM | POA: Diagnosis not present

## 2017-01-25 DIAGNOSIS — E11311 Type 2 diabetes mellitus with unspecified diabetic retinopathy with macular edema: Secondary | ICD-10-CM | POA: Diagnosis not present

## 2017-01-25 DIAGNOSIS — Z4800 Encounter for change or removal of nonsurgical wound dressing: Secondary | ICD-10-CM | POA: Diagnosis not present

## 2017-01-25 DIAGNOSIS — Z794 Long term (current) use of insulin: Secondary | ICD-10-CM | POA: Diagnosis not present

## 2017-01-25 DIAGNOSIS — Z89422 Acquired absence of other left toe(s): Secondary | ICD-10-CM | POA: Diagnosis not present

## 2017-01-25 DIAGNOSIS — M79672 Pain in left foot: Secondary | ICD-10-CM | POA: Diagnosis not present

## 2017-01-25 DIAGNOSIS — M545 Low back pain: Secondary | ICD-10-CM | POA: Diagnosis not present

## 2017-01-25 DIAGNOSIS — E1122 Type 2 diabetes mellitus with diabetic chronic kidney disease: Secondary | ICD-10-CM | POA: Diagnosis not present

## 2017-01-26 DIAGNOSIS — E1122 Type 2 diabetes mellitus with diabetic chronic kidney disease: Secondary | ICD-10-CM | POA: Diagnosis not present

## 2017-01-26 DIAGNOSIS — Z7901 Long term (current) use of anticoagulants: Secondary | ICD-10-CM | POA: Diagnosis not present

## 2017-01-26 DIAGNOSIS — I129 Hypertensive chronic kidney disease with stage 1 through stage 4 chronic kidney disease, or unspecified chronic kidney disease: Secondary | ICD-10-CM | POA: Diagnosis not present

## 2017-01-26 DIAGNOSIS — Z89422 Acquired absence of other left toe(s): Secondary | ICD-10-CM | POA: Diagnosis not present

## 2017-01-26 DIAGNOSIS — N183 Chronic kidney disease, stage 3 (moderate): Secondary | ICD-10-CM | POA: Diagnosis not present

## 2017-01-26 DIAGNOSIS — E11621 Type 2 diabetes mellitus with foot ulcer: Secondary | ICD-10-CM | POA: Diagnosis not present

## 2017-01-26 DIAGNOSIS — E11311 Type 2 diabetes mellitus with unspecified diabetic retinopathy with macular edema: Secondary | ICD-10-CM | POA: Diagnosis not present

## 2017-01-26 DIAGNOSIS — Z4800 Encounter for change or removal of nonsurgical wound dressing: Secondary | ICD-10-CM | POA: Diagnosis not present

## 2017-01-26 DIAGNOSIS — Z794 Long term (current) use of insulin: Secondary | ICD-10-CM | POA: Diagnosis not present

## 2017-01-26 DIAGNOSIS — E1142 Type 2 diabetes mellitus with diabetic polyneuropathy: Secondary | ICD-10-CM | POA: Diagnosis not present

## 2017-01-26 DIAGNOSIS — L97522 Non-pressure chronic ulcer of other part of left foot with fat layer exposed: Secondary | ICD-10-CM | POA: Diagnosis not present

## 2017-01-27 ENCOUNTER — Telehealth: Payer: Self-pay | Admitting: *Deleted

## 2017-01-27 DIAGNOSIS — E11311 Type 2 diabetes mellitus with unspecified diabetic retinopathy with macular edema: Secondary | ICD-10-CM | POA: Diagnosis not present

## 2017-01-27 DIAGNOSIS — E1122 Type 2 diabetes mellitus with diabetic chronic kidney disease: Secondary | ICD-10-CM | POA: Diagnosis not present

## 2017-01-27 DIAGNOSIS — Z4800 Encounter for change or removal of nonsurgical wound dressing: Secondary | ICD-10-CM | POA: Diagnosis not present

## 2017-01-27 DIAGNOSIS — I129 Hypertensive chronic kidney disease with stage 1 through stage 4 chronic kidney disease, or unspecified chronic kidney disease: Secondary | ICD-10-CM | POA: Diagnosis not present

## 2017-01-27 DIAGNOSIS — E11621 Type 2 diabetes mellitus with foot ulcer: Secondary | ICD-10-CM | POA: Diagnosis not present

## 2017-01-27 DIAGNOSIS — N183 Chronic kidney disease, stage 3 (moderate): Secondary | ICD-10-CM | POA: Diagnosis not present

## 2017-01-27 DIAGNOSIS — Z7901 Long term (current) use of anticoagulants: Secondary | ICD-10-CM | POA: Diagnosis not present

## 2017-01-27 DIAGNOSIS — Z89422 Acquired absence of other left toe(s): Secondary | ICD-10-CM | POA: Diagnosis not present

## 2017-01-27 DIAGNOSIS — L97522 Non-pressure chronic ulcer of other part of left foot with fat layer exposed: Secondary | ICD-10-CM | POA: Diagnosis not present

## 2017-01-27 DIAGNOSIS — Z794 Long term (current) use of insulin: Secondary | ICD-10-CM | POA: Diagnosis not present

## 2017-01-27 DIAGNOSIS — E1142 Type 2 diabetes mellitus with diabetic polyneuropathy: Secondary | ICD-10-CM | POA: Diagnosis not present

## 2017-01-27 NOTE — Telephone Encounter (Addendum)
RTC to patient.  Had a question about a letter he received about his labs.  Message was left that call had been returned and that labs were ok but the doctor would like to continue to watch.  Patient was also asked to return call if further questions.  [name], RN 01/27/2017 8:54 AM RTC from patient given information that Kidney function labs were close to his baseline and that the Vit D level was ok. Doctor Dareen Piano wants to keep a close watch so that the levels will not worsen.  Patient voiced understanding of the plan. [name], RN 01/27/2017 9:34 AM

## 2017-01-28 DIAGNOSIS — E11621 Type 2 diabetes mellitus with foot ulcer: Secondary | ICD-10-CM | POA: Diagnosis not present

## 2017-01-28 DIAGNOSIS — L97522 Non-pressure chronic ulcer of other part of left foot with fat layer exposed: Secondary | ICD-10-CM | POA: Diagnosis not present

## 2017-01-28 DIAGNOSIS — E1122 Type 2 diabetes mellitus with diabetic chronic kidney disease: Secondary | ICD-10-CM | POA: Diagnosis not present

## 2017-01-28 DIAGNOSIS — Z4800 Encounter for change or removal of nonsurgical wound dressing: Secondary | ICD-10-CM | POA: Diagnosis not present

## 2017-01-28 DIAGNOSIS — Z89422 Acquired absence of other left toe(s): Secondary | ICD-10-CM | POA: Diagnosis not present

## 2017-01-28 DIAGNOSIS — E11311 Type 2 diabetes mellitus with unspecified diabetic retinopathy with macular edema: Secondary | ICD-10-CM | POA: Diagnosis not present

## 2017-01-28 DIAGNOSIS — E1142 Type 2 diabetes mellitus with diabetic polyneuropathy: Secondary | ICD-10-CM | POA: Diagnosis not present

## 2017-01-28 DIAGNOSIS — N183 Chronic kidney disease, stage 3 (moderate): Secondary | ICD-10-CM | POA: Diagnosis not present

## 2017-01-28 DIAGNOSIS — Z7901 Long term (current) use of anticoagulants: Secondary | ICD-10-CM | POA: Diagnosis not present

## 2017-01-28 DIAGNOSIS — Z794 Long term (current) use of insulin: Secondary | ICD-10-CM | POA: Diagnosis not present

## 2017-01-28 DIAGNOSIS — I129 Hypertensive chronic kidney disease with stage 1 through stage 4 chronic kidney disease, or unspecified chronic kidney disease: Secondary | ICD-10-CM | POA: Diagnosis not present

## 2017-01-29 DIAGNOSIS — Z794 Long term (current) use of insulin: Secondary | ICD-10-CM | POA: Diagnosis not present

## 2017-01-29 DIAGNOSIS — E11311 Type 2 diabetes mellitus with unspecified diabetic retinopathy with macular edema: Secondary | ICD-10-CM | POA: Diagnosis not present

## 2017-01-29 DIAGNOSIS — Z4800 Encounter for change or removal of nonsurgical wound dressing: Secondary | ICD-10-CM | POA: Diagnosis not present

## 2017-01-29 DIAGNOSIS — Z89422 Acquired absence of other left toe(s): Secondary | ICD-10-CM | POA: Diagnosis not present

## 2017-01-29 DIAGNOSIS — Z7901 Long term (current) use of anticoagulants: Secondary | ICD-10-CM | POA: Diagnosis not present

## 2017-01-29 DIAGNOSIS — E1122 Type 2 diabetes mellitus with diabetic chronic kidney disease: Secondary | ICD-10-CM | POA: Diagnosis not present

## 2017-01-29 DIAGNOSIS — I129 Hypertensive chronic kidney disease with stage 1 through stage 4 chronic kidney disease, or unspecified chronic kidney disease: Secondary | ICD-10-CM | POA: Diagnosis not present

## 2017-01-29 DIAGNOSIS — N183 Chronic kidney disease, stage 3 (moderate): Secondary | ICD-10-CM | POA: Diagnosis not present

## 2017-01-29 DIAGNOSIS — L97522 Non-pressure chronic ulcer of other part of left foot with fat layer exposed: Secondary | ICD-10-CM | POA: Diagnosis not present

## 2017-01-29 DIAGNOSIS — E1142 Type 2 diabetes mellitus with diabetic polyneuropathy: Secondary | ICD-10-CM | POA: Diagnosis not present

## 2017-01-29 DIAGNOSIS — E11621 Type 2 diabetes mellitus with foot ulcer: Secondary | ICD-10-CM | POA: Diagnosis not present

## 2017-01-30 DIAGNOSIS — E11621 Type 2 diabetes mellitus with foot ulcer: Secondary | ICD-10-CM | POA: Diagnosis not present

## 2017-01-30 DIAGNOSIS — Z794 Long term (current) use of insulin: Secondary | ICD-10-CM | POA: Diagnosis not present

## 2017-01-30 DIAGNOSIS — E11311 Type 2 diabetes mellitus with unspecified diabetic retinopathy with macular edema: Secondary | ICD-10-CM | POA: Diagnosis not present

## 2017-01-30 DIAGNOSIS — Z4800 Encounter for change or removal of nonsurgical wound dressing: Secondary | ICD-10-CM | POA: Diagnosis not present

## 2017-01-30 DIAGNOSIS — Z89422 Acquired absence of other left toe(s): Secondary | ICD-10-CM | POA: Diagnosis not present

## 2017-01-30 DIAGNOSIS — Z7901 Long term (current) use of anticoagulants: Secondary | ICD-10-CM | POA: Diagnosis not present

## 2017-01-30 DIAGNOSIS — I129 Hypertensive chronic kidney disease with stage 1 through stage 4 chronic kidney disease, or unspecified chronic kidney disease: Secondary | ICD-10-CM | POA: Diagnosis not present

## 2017-01-30 DIAGNOSIS — E1142 Type 2 diabetes mellitus with diabetic polyneuropathy: Secondary | ICD-10-CM | POA: Diagnosis not present

## 2017-01-30 DIAGNOSIS — E1122 Type 2 diabetes mellitus with diabetic chronic kidney disease: Secondary | ICD-10-CM | POA: Diagnosis not present

## 2017-01-30 DIAGNOSIS — L97522 Non-pressure chronic ulcer of other part of left foot with fat layer exposed: Secondary | ICD-10-CM | POA: Diagnosis not present

## 2017-01-30 DIAGNOSIS — N183 Chronic kidney disease, stage 3 (moderate): Secondary | ICD-10-CM | POA: Diagnosis not present

## 2017-02-01 DIAGNOSIS — E1122 Type 2 diabetes mellitus with diabetic chronic kidney disease: Secondary | ICD-10-CM | POA: Diagnosis not present

## 2017-02-01 DIAGNOSIS — E1142 Type 2 diabetes mellitus with diabetic polyneuropathy: Secondary | ICD-10-CM | POA: Diagnosis not present

## 2017-02-01 DIAGNOSIS — Z4800 Encounter for change or removal of nonsurgical wound dressing: Secondary | ICD-10-CM | POA: Diagnosis not present

## 2017-02-01 DIAGNOSIS — E11311 Type 2 diabetes mellitus with unspecified diabetic retinopathy with macular edema: Secondary | ICD-10-CM | POA: Diagnosis not present

## 2017-02-01 DIAGNOSIS — N183 Chronic kidney disease, stage 3 (moderate): Secondary | ICD-10-CM | POA: Diagnosis not present

## 2017-02-01 DIAGNOSIS — E11621 Type 2 diabetes mellitus with foot ulcer: Secondary | ICD-10-CM | POA: Diagnosis not present

## 2017-02-01 DIAGNOSIS — Z7901 Long term (current) use of anticoagulants: Secondary | ICD-10-CM | POA: Diagnosis not present

## 2017-02-01 DIAGNOSIS — L97522 Non-pressure chronic ulcer of other part of left foot with fat layer exposed: Secondary | ICD-10-CM | POA: Diagnosis not present

## 2017-02-01 DIAGNOSIS — I129 Hypertensive chronic kidney disease with stage 1 through stage 4 chronic kidney disease, or unspecified chronic kidney disease: Secondary | ICD-10-CM | POA: Diagnosis not present

## 2017-02-01 DIAGNOSIS — Z89422 Acquired absence of other left toe(s): Secondary | ICD-10-CM | POA: Diagnosis not present

## 2017-02-01 DIAGNOSIS — Z794 Long term (current) use of insulin: Secondary | ICD-10-CM | POA: Diagnosis not present

## 2017-02-02 ENCOUNTER — Other Ambulatory Visit: Payer: Self-pay | Admitting: Endocrinology

## 2017-02-02 DIAGNOSIS — Z4800 Encounter for change or removal of nonsurgical wound dressing: Secondary | ICD-10-CM | POA: Diagnosis not present

## 2017-02-02 DIAGNOSIS — E1165 Type 2 diabetes mellitus with hyperglycemia: Secondary | ICD-10-CM

## 2017-02-02 DIAGNOSIS — E11311 Type 2 diabetes mellitus with unspecified diabetic retinopathy with macular edema: Secondary | ICD-10-CM | POA: Diagnosis not present

## 2017-02-02 DIAGNOSIS — E1122 Type 2 diabetes mellitus with diabetic chronic kidney disease: Secondary | ICD-10-CM | POA: Diagnosis not present

## 2017-02-02 DIAGNOSIS — E1142 Type 2 diabetes mellitus with diabetic polyneuropathy: Secondary | ICD-10-CM | POA: Diagnosis not present

## 2017-02-02 DIAGNOSIS — E11621 Type 2 diabetes mellitus with foot ulcer: Secondary | ICD-10-CM | POA: Diagnosis not present

## 2017-02-02 DIAGNOSIS — I129 Hypertensive chronic kidney disease with stage 1 through stage 4 chronic kidney disease, or unspecified chronic kidney disease: Secondary | ICD-10-CM | POA: Diagnosis not present

## 2017-02-02 DIAGNOSIS — L97522 Non-pressure chronic ulcer of other part of left foot with fat layer exposed: Secondary | ICD-10-CM | POA: Diagnosis not present

## 2017-02-02 DIAGNOSIS — Z89422 Acquired absence of other left toe(s): Secondary | ICD-10-CM | POA: Diagnosis not present

## 2017-02-02 DIAGNOSIS — Z794 Long term (current) use of insulin: Principal | ICD-10-CM

## 2017-02-02 DIAGNOSIS — Z7901 Long term (current) use of anticoagulants: Secondary | ICD-10-CM | POA: Diagnosis not present

## 2017-02-02 DIAGNOSIS — N183 Chronic kidney disease, stage 3 (moderate): Secondary | ICD-10-CM | POA: Diagnosis not present

## 2017-02-03 ENCOUNTER — Encounter: Payer: Self-pay | Admitting: Podiatry

## 2017-02-03 ENCOUNTER — Ambulatory Visit (INDEPENDENT_AMBULATORY_CARE_PROVIDER_SITE_OTHER): Payer: Medicare Other | Admitting: Podiatry

## 2017-02-03 VITALS — BP 130/63 | HR 82 | Temp 97.8°F | Resp 18

## 2017-02-03 DIAGNOSIS — L97522 Non-pressure chronic ulcer of other part of left foot with fat layer exposed: Secondary | ICD-10-CM

## 2017-02-03 NOTE — Patient Instructions (Signed)
Continue to apply Granulex gel and gauze dressing daily to the skin ulcer on the left foot Continue wear surgical wedge shoe on the left foot If you notice any sudden increase in pain, swelling, redness, fever presents emergency department  Diabetes and Foot Care Diabetes may cause you to have problems because of poor blood supply (circulation) to your feet and legs. This may cause the skin on your feet to become thinner, break easier, and heal more slowly. Your skin may become dry, and the skin may peel and crack. You may also have nerve damage in your legs and feet causing decreased feeling in them. You may not notice minor injuries to your feet that could lead to infections or more serious problems. Taking care of your feet is one of the most important things you can do for yourself. Follow these instructions at home:  Wear shoes at all times, even in the house. Do not go barefoot. Bare feet are easily injured.  Check your feet daily for blisters, cuts, and redness. If you cannot see the bottom of your feet, use a mirror or ask someone for help.  Wash your feet with warm water (do not use hot water) and mild soap. Then pat your feet and the areas between your toes until they are completely dry. Do not soak your feet as this can dry your skin.  Apply a moisturizing lotion or petroleum jelly (that does not contain alcohol and is unscented) to the skin on your feet and to dry, brittle toenails. Do not apply lotion between your toes.  Trim your toenails straight across. Do not dig under them or around the cuticle. File the edges of your nails with an emery board or nail file.  Do not cut corns or calluses or try to remove them with medicine.  Wear clean socks or stockings every day. Make sure they are not too tight. Do not wear knee-high stockings since they may decrease blood flow to your legs.  Wear shoes that fit properly and have enough cushioning. To break in new shoes, wear them for just  a few hours a day. This prevents you from injuring your feet. Always look in your shoes before you put them on to be sure there are no objects inside.  Do not cross your legs. This may decrease the blood flow to your feet.  If you find a minor scrape, cut, or break in the skin on your feet, keep it and the skin around it clean and dry. These areas may be cleansed with mild soap and water. Do not cleanse the area with peroxide, alcohol, or iodine.  When you remove an adhesive bandage, be sure not to damage the skin around it.  If you have a wound, look at it several times a day to make sure it is healing.  Do not use heating pads or hot water bottles. They may burn your skin. If you have lost feeling in your feet or legs, you may not know it is happening until it is too late.  Make sure your health care provider performs a complete foot exam at least annually or more often if you have foot problems. Report any cuts, sores, or bruises to your health care provider immediately. Contact a health care provider if:  You have an injury that is not healing.  You have cuts or breaks in the skin.  You have an ingrown nail.  You notice redness on your legs or feet.  You feel  burning or tingling in your legs or feet.  You have pain or cramps in your legs and feet.  Your legs or feet are numb.  Your feet always feel cold. Get help right away if:  There is increasing redness, swelling, or pain in or around a wound.  There is a red line that goes up your leg.  Pus is coming from a wound.  You develop a fever or as directed by your health care provider.  You notice a bad smell coming from an ulcer or wound. This information is not intended to replace advice given to you by your health care provider. Make sure you discuss any questions you have with your health care provider. Document Released: 06/26/2000 Document Revised: 12/05/2015 Document Reviewed: 12/06/2012 Elsevier Interactive Patient  Education  2017 [employer].

## 2017-02-03 NOTE — Progress Notes (Signed)
Patient ID: [name], male   DOB: 1951/05/24, 66 y.o.   MRN: [id number]    Subjective: This patient presents today for ongoing care for diabetic plantar skin ulcer. Patient under care of Dr. Cannon Kettle. Patient currently applying reranexto the wound daily and offloading with the Darco wedge shoe  Objective:  Orientated 3 Amputated second and third left toes Plantar wound measures 10 x 20 mm with a granular base surrounded by hyperkeratotic tissue. There is no surrounding erythema, edema, warmth. The wound does not probe deep.  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 Cannon Kettle

## 2017-02-04 ENCOUNTER — Encounter: Payer: Medicare Other | Admitting: Dietician

## 2017-02-04 ENCOUNTER — Telehealth: Payer: Self-pay | Admitting: *Deleted

## 2017-02-04 DIAGNOSIS — N183 Chronic kidney disease, stage 3 (moderate): Secondary | ICD-10-CM | POA: Diagnosis not present

## 2017-02-04 DIAGNOSIS — I129 Hypertensive chronic kidney disease with stage 1 through stage 4 chronic kidney disease, or unspecified chronic kidney disease: Secondary | ICD-10-CM | POA: Diagnosis not present

## 2017-02-04 DIAGNOSIS — E11311 Type 2 diabetes mellitus with unspecified diabetic retinopathy with macular edema: Secondary | ICD-10-CM | POA: Diagnosis not present

## 2017-02-04 DIAGNOSIS — Z7901 Long term (current) use of anticoagulants: Secondary | ICD-10-CM | POA: Diagnosis not present

## 2017-02-04 DIAGNOSIS — Z89422 Acquired absence of other left toe(s): Secondary | ICD-10-CM | POA: Diagnosis not present

## 2017-02-04 DIAGNOSIS — E1122 Type 2 diabetes mellitus with diabetic chronic kidney disease: Secondary | ICD-10-CM | POA: Diagnosis not present

## 2017-02-04 DIAGNOSIS — L97522 Non-pressure chronic ulcer of other part of left foot with fat layer exposed: Secondary | ICD-10-CM | POA: Diagnosis not present

## 2017-02-04 DIAGNOSIS — E11621 Type 2 diabetes mellitus with foot ulcer: Secondary | ICD-10-CM | POA: Diagnosis not present

## 2017-02-04 DIAGNOSIS — Z4800 Encounter for change or removal of nonsurgical wound dressing: Secondary | ICD-10-CM | POA: Diagnosis not present

## 2017-02-04 DIAGNOSIS — Z794 Long term (current) use of insulin: Secondary | ICD-10-CM | POA: Diagnosis not present

## 2017-02-04 DIAGNOSIS — E1142 Type 2 diabetes mellitus with diabetic polyneuropathy: Secondary | ICD-10-CM | POA: Diagnosis not present

## 2017-02-04 NOTE — Telephone Encounter (Signed)
-----   Message from Forde Dandy, PharmD sent at 12/24/2016  6:13 PM EDT ----- Regarding: Geriatrics Task Force Patient due for follow up call

## 2017-02-04 NOTE — Telephone Encounter (Signed)
  BRIGHTEN ORNDOFF is a 66 y.o. male who was contacted on behalf of [facility] Geriatrics Task Force.   Diabetes Assessment  DM meds and BS checks -  "What medications are you taking for diabetes?"  -  "How often do you check your blood sugars at home?"  - "What have your blood sugars been?"   High Blood Pressure Assessment  BP meds & BP checks -  "What medications are you taking for high blood pressure?"  - "How often do you check your blood pressure at home?"  - "What have your blood pressure readings been?"   Coping with DM and BP - What else are you doing to help with your DM and BP - Diet?  Exercise?   Medication Access Issues  Medication Issues? -  "Are you getting your medicines refilled on time without skipping any doses?"  - "Are you having any problems getting/taking your meds (cost, timing, transportation)?"  - Do you need any meds refilled?   Conclusion  Close the call - Date of follow-up visit scheduled - "Any other questions or concerns?"   advanced directives

## 2017-02-05 DIAGNOSIS — E1142 Type 2 diabetes mellitus with diabetic polyneuropathy: Secondary | ICD-10-CM | POA: Diagnosis not present

## 2017-02-05 DIAGNOSIS — E1122 Type 2 diabetes mellitus with diabetic chronic kidney disease: Secondary | ICD-10-CM | POA: Diagnosis not present

## 2017-02-05 DIAGNOSIS — Z4800 Encounter for change or removal of nonsurgical wound dressing: Secondary | ICD-10-CM | POA: Diagnosis not present

## 2017-02-05 DIAGNOSIS — I129 Hypertensive chronic kidney disease with stage 1 through stage 4 chronic kidney disease, or unspecified chronic kidney disease: Secondary | ICD-10-CM | POA: Diagnosis not present

## 2017-02-05 DIAGNOSIS — Z89422 Acquired absence of other left toe(s): Secondary | ICD-10-CM | POA: Diagnosis not present

## 2017-02-05 DIAGNOSIS — N183 Chronic kidney disease, stage 3 (moderate): Secondary | ICD-10-CM | POA: Diagnosis not present

## 2017-02-05 DIAGNOSIS — Z794 Long term (current) use of insulin: Secondary | ICD-10-CM | POA: Diagnosis not present

## 2017-02-05 DIAGNOSIS — Z7901 Long term (current) use of anticoagulants: Secondary | ICD-10-CM | POA: Diagnosis not present

## 2017-02-05 DIAGNOSIS — E11311 Type 2 diabetes mellitus with unspecified diabetic retinopathy with macular edema: Secondary | ICD-10-CM | POA: Diagnosis not present

## 2017-02-05 DIAGNOSIS — E11621 Type 2 diabetes mellitus with foot ulcer: Secondary | ICD-10-CM | POA: Diagnosis not present

## 2017-02-05 DIAGNOSIS — L97522 Non-pressure chronic ulcer of other part of left foot with fat layer exposed: Secondary | ICD-10-CM | POA: Diagnosis not present

## 2017-02-06 DIAGNOSIS — E11311 Type 2 diabetes mellitus with unspecified diabetic retinopathy with macular edema: Secondary | ICD-10-CM | POA: Diagnosis not present

## 2017-02-06 DIAGNOSIS — E11621 Type 2 diabetes mellitus with foot ulcer: Secondary | ICD-10-CM | POA: Diagnosis not present

## 2017-02-06 DIAGNOSIS — E1122 Type 2 diabetes mellitus with diabetic chronic kidney disease: Secondary | ICD-10-CM | POA: Diagnosis not present

## 2017-02-06 DIAGNOSIS — I129 Hypertensive chronic kidney disease with stage 1 through stage 4 chronic kidney disease, or unspecified chronic kidney disease: Secondary | ICD-10-CM | POA: Diagnosis not present

## 2017-02-06 DIAGNOSIS — Z794 Long term (current) use of insulin: Secondary | ICD-10-CM | POA: Diagnosis not present

## 2017-02-06 DIAGNOSIS — Z4800 Encounter for change or removal of nonsurgical wound dressing: Secondary | ICD-10-CM | POA: Diagnosis not present

## 2017-02-06 DIAGNOSIS — N183 Chronic kidney disease, stage 3 (moderate): Secondary | ICD-10-CM | POA: Diagnosis not present

## 2017-02-06 DIAGNOSIS — L97522 Non-pressure chronic ulcer of other part of left foot with fat layer exposed: Secondary | ICD-10-CM | POA: Diagnosis not present

## 2017-02-06 DIAGNOSIS — Z89422 Acquired absence of other left toe(s): Secondary | ICD-10-CM | POA: Diagnosis not present

## 2017-02-06 DIAGNOSIS — E1142 Type 2 diabetes mellitus with diabetic polyneuropathy: Secondary | ICD-10-CM | POA: Diagnosis not present

## 2017-02-06 DIAGNOSIS — Z7901 Long term (current) use of anticoagulants: Secondary | ICD-10-CM | POA: Diagnosis not present

## 2017-02-07 DIAGNOSIS — E1122 Type 2 diabetes mellitus with diabetic chronic kidney disease: Secondary | ICD-10-CM | POA: Diagnosis not present

## 2017-02-07 DIAGNOSIS — E1142 Type 2 diabetes mellitus with diabetic polyneuropathy: Secondary | ICD-10-CM | POA: Diagnosis not present

## 2017-02-07 DIAGNOSIS — Z4800 Encounter for change or removal of nonsurgical wound dressing: Secondary | ICD-10-CM | POA: Diagnosis not present

## 2017-02-07 DIAGNOSIS — E11621 Type 2 diabetes mellitus with foot ulcer: Secondary | ICD-10-CM | POA: Diagnosis not present

## 2017-02-07 DIAGNOSIS — Z89422 Acquired absence of other left toe(s): Secondary | ICD-10-CM | POA: Diagnosis not present

## 2017-02-07 DIAGNOSIS — E11311 Type 2 diabetes mellitus with unspecified diabetic retinopathy with macular edema: Secondary | ICD-10-CM | POA: Diagnosis not present

## 2017-02-07 DIAGNOSIS — Z794 Long term (current) use of insulin: Secondary | ICD-10-CM | POA: Diagnosis not present

## 2017-02-07 DIAGNOSIS — Z7901 Long term (current) use of anticoagulants: Secondary | ICD-10-CM | POA: Diagnosis not present

## 2017-02-07 DIAGNOSIS — N183 Chronic kidney disease, stage 3 (moderate): Secondary | ICD-10-CM | POA: Diagnosis not present

## 2017-02-07 DIAGNOSIS — I129 Hypertensive chronic kidney disease with stage 1 through stage 4 chronic kidney disease, or unspecified chronic kidney disease: Secondary | ICD-10-CM | POA: Diagnosis not present

## 2017-02-07 DIAGNOSIS — L97522 Non-pressure chronic ulcer of other part of left foot with fat layer exposed: Secondary | ICD-10-CM | POA: Diagnosis not present

## 2017-02-08 ENCOUNTER — Other Ambulatory Visit: Payer: Self-pay | Admitting: Endocrinology

## 2017-02-08 DIAGNOSIS — Z4800 Encounter for change or removal of nonsurgical wound dressing: Secondary | ICD-10-CM | POA: Diagnosis not present

## 2017-02-08 DIAGNOSIS — N183 Chronic kidney disease, stage 3 (moderate): Secondary | ICD-10-CM | POA: Diagnosis not present

## 2017-02-08 DIAGNOSIS — Z794 Long term (current) use of insulin: Secondary | ICD-10-CM | POA: Diagnosis not present

## 2017-02-08 DIAGNOSIS — E1122 Type 2 diabetes mellitus with diabetic chronic kidney disease: Secondary | ICD-10-CM | POA: Diagnosis not present

## 2017-02-08 DIAGNOSIS — E11621 Type 2 diabetes mellitus with foot ulcer: Secondary | ICD-10-CM | POA: Diagnosis not present

## 2017-02-08 DIAGNOSIS — I129 Hypertensive chronic kidney disease with stage 1 through stage 4 chronic kidney disease, or unspecified chronic kidney disease: Secondary | ICD-10-CM | POA: Diagnosis not present

## 2017-02-08 DIAGNOSIS — Z7901 Long term (current) use of anticoagulants: Secondary | ICD-10-CM | POA: Diagnosis not present

## 2017-02-08 DIAGNOSIS — E1142 Type 2 diabetes mellitus with diabetic polyneuropathy: Secondary | ICD-10-CM | POA: Diagnosis not present

## 2017-02-08 DIAGNOSIS — L97522 Non-pressure chronic ulcer of other part of left foot with fat layer exposed: Secondary | ICD-10-CM | POA: Diagnosis not present

## 2017-02-08 DIAGNOSIS — E11311 Type 2 diabetes mellitus with unspecified diabetic retinopathy with macular edema: Secondary | ICD-10-CM | POA: Diagnosis not present

## 2017-02-08 DIAGNOSIS — Z89422 Acquired absence of other left toe(s): Secondary | ICD-10-CM | POA: Diagnosis not present

## 2017-02-09 ENCOUNTER — Ambulatory Visit: Payer: Medicare Other | Admitting: Sports Medicine

## 2017-02-09 DIAGNOSIS — E11311 Type 2 diabetes mellitus with unspecified diabetic retinopathy with macular edema: Secondary | ICD-10-CM | POA: Diagnosis not present

## 2017-02-09 DIAGNOSIS — Z4800 Encounter for change or removal of nonsurgical wound dressing: Secondary | ICD-10-CM | POA: Diagnosis not present

## 2017-02-09 DIAGNOSIS — E1142 Type 2 diabetes mellitus with diabetic polyneuropathy: Secondary | ICD-10-CM | POA: Diagnosis not present

## 2017-02-09 DIAGNOSIS — E11621 Type 2 diabetes mellitus with foot ulcer: Secondary | ICD-10-CM | POA: Diagnosis not present

## 2017-02-09 DIAGNOSIS — Z89422 Acquired absence of other left toe(s): Secondary | ICD-10-CM | POA: Diagnosis not present

## 2017-02-09 DIAGNOSIS — E1122 Type 2 diabetes mellitus with diabetic chronic kidney disease: Secondary | ICD-10-CM | POA: Diagnosis not present

## 2017-02-09 DIAGNOSIS — I129 Hypertensive chronic kidney disease with stage 1 through stage 4 chronic kidney disease, or unspecified chronic kidney disease: Secondary | ICD-10-CM | POA: Diagnosis not present

## 2017-02-09 DIAGNOSIS — L97522 Non-pressure chronic ulcer of other part of left foot with fat layer exposed: Secondary | ICD-10-CM | POA: Diagnosis not present

## 2017-02-09 DIAGNOSIS — Z794 Long term (current) use of insulin: Secondary | ICD-10-CM | POA: Diagnosis not present

## 2017-02-09 DIAGNOSIS — Z7901 Long term (current) use of anticoagulants: Secondary | ICD-10-CM | POA: Diagnosis not present

## 2017-02-09 DIAGNOSIS — N183 Chronic kidney disease, stage 3 (moderate): Secondary | ICD-10-CM | POA: Diagnosis not present

## 2017-02-10 DIAGNOSIS — E11311 Type 2 diabetes mellitus with unspecified diabetic retinopathy with macular edema: Secondary | ICD-10-CM | POA: Diagnosis not present

## 2017-02-10 DIAGNOSIS — E1142 Type 2 diabetes mellitus with diabetic polyneuropathy: Secondary | ICD-10-CM | POA: Diagnosis not present

## 2017-02-10 DIAGNOSIS — L97522 Non-pressure chronic ulcer of other part of left foot with fat layer exposed: Secondary | ICD-10-CM | POA: Diagnosis not present

## 2017-02-10 DIAGNOSIS — E11621 Type 2 diabetes mellitus with foot ulcer: Secondary | ICD-10-CM | POA: Diagnosis not present

## 2017-02-10 DIAGNOSIS — Z794 Long term (current) use of insulin: Secondary | ICD-10-CM | POA: Diagnosis not present

## 2017-02-10 DIAGNOSIS — Z89422 Acquired absence of other left toe(s): Secondary | ICD-10-CM | POA: Diagnosis not present

## 2017-02-10 DIAGNOSIS — Z7901 Long term (current) use of anticoagulants: Secondary | ICD-10-CM | POA: Diagnosis not present

## 2017-02-10 DIAGNOSIS — N183 Chronic kidney disease, stage 3 (moderate): Secondary | ICD-10-CM | POA: Diagnosis not present

## 2017-02-10 DIAGNOSIS — E1122 Type 2 diabetes mellitus with diabetic chronic kidney disease: Secondary | ICD-10-CM | POA: Diagnosis not present

## 2017-02-10 DIAGNOSIS — Z4800 Encounter for change or removal of nonsurgical wound dressing: Secondary | ICD-10-CM | POA: Diagnosis not present

## 2017-02-10 DIAGNOSIS — I129 Hypertensive chronic kidney disease with stage 1 through stage 4 chronic kidney disease, or unspecified chronic kidney disease: Secondary | ICD-10-CM | POA: Diagnosis not present

## 2017-02-11 DIAGNOSIS — E11621 Type 2 diabetes mellitus with foot ulcer: Secondary | ICD-10-CM | POA: Diagnosis not present

## 2017-02-11 DIAGNOSIS — L97522 Non-pressure chronic ulcer of other part of left foot with fat layer exposed: Secondary | ICD-10-CM | POA: Diagnosis not present

## 2017-02-11 DIAGNOSIS — Z7901 Long term (current) use of anticoagulants: Secondary | ICD-10-CM | POA: Diagnosis not present

## 2017-02-11 DIAGNOSIS — Z4800 Encounter for change or removal of nonsurgical wound dressing: Secondary | ICD-10-CM | POA: Diagnosis not present

## 2017-02-11 DIAGNOSIS — E1122 Type 2 diabetes mellitus with diabetic chronic kidney disease: Secondary | ICD-10-CM | POA: Diagnosis not present

## 2017-02-11 DIAGNOSIS — E11311 Type 2 diabetes mellitus with unspecified diabetic retinopathy with macular edema: Secondary | ICD-10-CM | POA: Diagnosis not present

## 2017-02-11 DIAGNOSIS — N183 Chronic kidney disease, stage 3 (moderate): Secondary | ICD-10-CM | POA: Diagnosis not present

## 2017-02-11 DIAGNOSIS — I129 Hypertensive chronic kidney disease with stage 1 through stage 4 chronic kidney disease, or unspecified chronic kidney disease: Secondary | ICD-10-CM | POA: Diagnosis not present

## 2017-02-11 DIAGNOSIS — Z794 Long term (current) use of insulin: Secondary | ICD-10-CM | POA: Diagnosis not present

## 2017-02-11 DIAGNOSIS — E1142 Type 2 diabetes mellitus with diabetic polyneuropathy: Secondary | ICD-10-CM | POA: Diagnosis not present

## 2017-02-11 DIAGNOSIS — Z89422 Acquired absence of other left toe(s): Secondary | ICD-10-CM | POA: Diagnosis not present

## 2017-02-12 DIAGNOSIS — Z794 Long term (current) use of insulin: Secondary | ICD-10-CM | POA: Diagnosis not present

## 2017-02-12 DIAGNOSIS — Z89422 Acquired absence of other left toe(s): Secondary | ICD-10-CM | POA: Diagnosis not present

## 2017-02-12 DIAGNOSIS — E1142 Type 2 diabetes mellitus with diabetic polyneuropathy: Secondary | ICD-10-CM | POA: Diagnosis not present

## 2017-02-12 DIAGNOSIS — Z7901 Long term (current) use of anticoagulants: Secondary | ICD-10-CM | POA: Diagnosis not present

## 2017-02-12 DIAGNOSIS — N183 Chronic kidney disease, stage 3 (moderate): Secondary | ICD-10-CM | POA: Diagnosis not present

## 2017-02-12 DIAGNOSIS — L97522 Non-pressure chronic ulcer of other part of left foot with fat layer exposed: Secondary | ICD-10-CM | POA: Diagnosis not present

## 2017-02-12 DIAGNOSIS — Z4800 Encounter for change or removal of nonsurgical wound dressing: Secondary | ICD-10-CM | POA: Diagnosis not present

## 2017-02-12 DIAGNOSIS — E11621 Type 2 diabetes mellitus with foot ulcer: Secondary | ICD-10-CM | POA: Diagnosis not present

## 2017-02-12 DIAGNOSIS — E1122 Type 2 diabetes mellitus with diabetic chronic kidney disease: Secondary | ICD-10-CM | POA: Diagnosis not present

## 2017-02-12 DIAGNOSIS — I129 Hypertensive chronic kidney disease with stage 1 through stage 4 chronic kidney disease, or unspecified chronic kidney disease: Secondary | ICD-10-CM | POA: Diagnosis not present

## 2017-02-12 DIAGNOSIS — E11311 Type 2 diabetes mellitus with unspecified diabetic retinopathy with macular edema: Secondary | ICD-10-CM | POA: Diagnosis not present

## 2017-02-13 DIAGNOSIS — Z89422 Acquired absence of other left toe(s): Secondary | ICD-10-CM | POA: Diagnosis not present

## 2017-02-13 DIAGNOSIS — E1122 Type 2 diabetes mellitus with diabetic chronic kidney disease: Secondary | ICD-10-CM | POA: Diagnosis not present

## 2017-02-13 DIAGNOSIS — N183 Chronic kidney disease, stage 3 (moderate): Secondary | ICD-10-CM | POA: Diagnosis not present

## 2017-02-13 DIAGNOSIS — Z7901 Long term (current) use of anticoagulants: Secondary | ICD-10-CM | POA: Diagnosis not present

## 2017-02-13 DIAGNOSIS — I129 Hypertensive chronic kidney disease with stage 1 through stage 4 chronic kidney disease, or unspecified chronic kidney disease: Secondary | ICD-10-CM | POA: Diagnosis not present

## 2017-02-13 DIAGNOSIS — E11621 Type 2 diabetes mellitus with foot ulcer: Secondary | ICD-10-CM | POA: Diagnosis not present

## 2017-02-13 DIAGNOSIS — E11311 Type 2 diabetes mellitus with unspecified diabetic retinopathy with macular edema: Secondary | ICD-10-CM | POA: Diagnosis not present

## 2017-02-13 DIAGNOSIS — Z794 Long term (current) use of insulin: Secondary | ICD-10-CM | POA: Diagnosis not present

## 2017-02-13 DIAGNOSIS — E1142 Type 2 diabetes mellitus with diabetic polyneuropathy: Secondary | ICD-10-CM | POA: Diagnosis not present

## 2017-02-13 DIAGNOSIS — L97522 Non-pressure chronic ulcer of other part of left foot with fat layer exposed: Secondary | ICD-10-CM | POA: Diagnosis not present

## 2017-02-13 DIAGNOSIS — Z4800 Encounter for change or removal of nonsurgical wound dressing: Secondary | ICD-10-CM | POA: Diagnosis not present

## 2017-02-14 DIAGNOSIS — G4733 Obstructive sleep apnea (adult) (pediatric): Secondary | ICD-10-CM | POA: Diagnosis not present

## 2017-02-15 DIAGNOSIS — Z4800 Encounter for change or removal of nonsurgical wound dressing: Secondary | ICD-10-CM | POA: Diagnosis not present

## 2017-02-15 DIAGNOSIS — L97522 Non-pressure chronic ulcer of other part of left foot with fat layer exposed: Secondary | ICD-10-CM | POA: Diagnosis not present

## 2017-02-15 DIAGNOSIS — E11311 Type 2 diabetes mellitus with unspecified diabetic retinopathy with macular edema: Secondary | ICD-10-CM | POA: Diagnosis not present

## 2017-02-15 DIAGNOSIS — E1142 Type 2 diabetes mellitus with diabetic polyneuropathy: Secondary | ICD-10-CM | POA: Diagnosis not present

## 2017-02-15 DIAGNOSIS — Z794 Long term (current) use of insulin: Secondary | ICD-10-CM | POA: Diagnosis not present

## 2017-02-15 DIAGNOSIS — Z89422 Acquired absence of other left toe(s): Secondary | ICD-10-CM | POA: Diagnosis not present

## 2017-02-15 DIAGNOSIS — I129 Hypertensive chronic kidney disease with stage 1 through stage 4 chronic kidney disease, or unspecified chronic kidney disease: Secondary | ICD-10-CM | POA: Diagnosis not present

## 2017-02-15 DIAGNOSIS — E1122 Type 2 diabetes mellitus with diabetic chronic kidney disease: Secondary | ICD-10-CM | POA: Diagnosis not present

## 2017-02-15 DIAGNOSIS — N183 Chronic kidney disease, stage 3 (moderate): Secondary | ICD-10-CM | POA: Diagnosis not present

## 2017-02-15 DIAGNOSIS — E11621 Type 2 diabetes mellitus with foot ulcer: Secondary | ICD-10-CM | POA: Diagnosis not present

## 2017-02-15 DIAGNOSIS — Z7901 Long term (current) use of anticoagulants: Secondary | ICD-10-CM | POA: Diagnosis not present

## 2017-02-16 ENCOUNTER — Ambulatory Visit (INDEPENDENT_AMBULATORY_CARE_PROVIDER_SITE_OTHER): Payer: Medicare Other | Admitting: Sports Medicine

## 2017-02-16 DIAGNOSIS — IMO0002 Reserved for concepts with insufficient information to code with codable children: Secondary | ICD-10-CM

## 2017-02-16 DIAGNOSIS — L97522 Non-pressure chronic ulcer of other part of left foot with fat layer exposed: Secondary | ICD-10-CM

## 2017-02-16 DIAGNOSIS — I739 Peripheral vascular disease, unspecified: Secondary | ICD-10-CM

## 2017-02-16 DIAGNOSIS — E0842 Diabetes mellitus due to underlying condition with diabetic polyneuropathy: Secondary | ICD-10-CM

## 2017-02-16 DIAGNOSIS — Z89432 Acquired absence of left foot: Secondary | ICD-10-CM

## 2017-02-16 DIAGNOSIS — H04123 Dry eye syndrome of bilateral lacrimal glands: Secondary | ICD-10-CM | POA: Diagnosis not present

## 2017-02-16 NOTE — Progress Notes (Signed)
Subjective: [name] is a 66 y.o. male patient seen today in office for POV #27 (DOS 06-15-16), S/P Left wound debridement and placement of stravix allograft. Patient is using regranex with improvement, denies pain at surgical site, denies calf pain, denies headache, chest pain, shortness of breath, nausea, vomiting, fever, or chills. Patient states that he is doing well with no issues. Has home nursing every day; reports no issues with dressings. No other issues noted.   Patient Active Problem List   Diagnosis Date Noted  . Chronic pain syndrome 09/18/2015  . Preventative health care 03/12/2015  . Acute osteomyelitis of toe of left foot ([place])   . Foot ulcer, left ([place]) 12/13/2014  . Paroxysmal atrial fibrillation ([place]) 12/13/2014  . Elevated alkaline phosphatase level 06/11/2014  . Constipation 05/31/2014  . Vitamin D deficiency 11/17/2013  . Anemia 02/10/2012  . Hypertension 11/18/2011  . Obstructive sleep apnea 12/12/2009  . GERD 06/27/2008  . Diabetes type 2, uncontrolled ([place]) 12/28/2007  . DIABETIC MACULAR EDEMA 10/07/2007  . Dyslipidemia 08/15/2007  . BACKGROUND DIABETIC RETINOPATHY 08/15/2007  . HEARING LOSS, SENSORINEURAL, BILATERAL 01/03/2007  . Diabetic peripheral neuropathy associated with type 2 diabetes mellitus ([place]) 12/16/2006  . Chronic kidney disease, stage III (moderate) 12/01/2006  . Morbid obesity ([place]) 08/23/2006  . STATUS, OTHER TOE(S) AMPUTATION 05/21/2006    Current Outpatient Prescriptions on File Prior to Visit  Medication Sig Dispense Refill  . ACCU-CHEK FASTCLIX LANCETS MISC Check blood sugar 5 times a day 408 each 3  . atorvastatin (LIPITOR) 20 MG tablet TAKE 1 TABLET(20 MG) BY MOUTH DAILY 90 tablet 3  . becaplermin (REGRANEX) 0.01 % gel Apply 1 application topically daily. 15 g 5  . BESIVANCE 0.6 % SUSP Place 1 drop into both eyes 4 (four) times daily.   12  . Blood Glucose Monitoring Suppl (ACCU-CHEK AVIVA PLUS) w/Device KIT CHECK BLOOD SUGAR  5 TIMES A DAY 1 kit 0  . Cholecalciferol (VITAMIN D3) 2000 UNITS capsule Take 1 capsule (2,000 Units total) by mouth daily. 90 capsule 0  . diclofenac sodium (VOLTAREN) 1 % GEL Apply 4 g topically 4 (four) times daily. 100 g 2  . docusate sodium (COLACE) 100 MG capsule Take 1 capsule (100 mg total) by mouth daily as needed for mild constipation. 14 capsule 0  . ELIQUIS 5 MG TABS tablet TAKE 1 TABLET(5 MG) BY MOUTH TWICE DAILY 180 tablet 3  . enalapril (VASOTEC) 20 MG tablet TAKE 2 TABLETS(40 MG) BY MOUTH DAILY 180 tablet 1  . furosemide (LASIX) 40 MG tablet TAKE 1 TABLET(40 MG) BY MOUTH TWICE DAILY 180 tablet 0  . gabapentin (NEURONTIN) 300 MG capsule TAKE 1 CAPSULE(300 MG) BY MOUTH AT BEDTIME 90 capsule 3  . glucose blood (ACCU-CHEK AVIVA PLUS) test strip USE TO CHECK BLOOD Five TIMES DAILY 450 each 3  . HYDROcodone-acetaminophen (NORCO) 7.5-325 MG tablet Take 1 tablet by mouth every 6 (six) hours as needed for moderate pain. 130 tablet 0  . Insulin Pen Needle 31G X 5 MM MISC Use for insulin injection 5 times a day. 150 each 5  . insulin regular human CONCENTRATED (HUMULIN R U-500 KWIKPEN) 500 UNIT/ML kwikpen INJECT 35 UNITS AT BREAKFAST, 60 UNITS AT LUNCH, AND 70 UNITS AT DINNER 20 mL 2  . LUMIGAN 0.01 % SOLN Place 1 drop into both eyes at bedtime.     . metoprolol succinate (TOPROL-XL) 25 MG 24 hr tablet TAKE 1 TABLET(25 MG) BY MOUTH DAILY 90 tablet 2  .  omeprazole (PRILOSEC) 20 MG capsule TAKE 1 CAPSULE(20 MG) BY MOUTH DAILY 90 capsule 1  . PROAIR HFA 108 (90 Base) MCG/ACT inhaler INHALE 2 PUFFS INTO THE LUNGS BY MOUTH EVERY 6 HOURS AS NEEDED FOR WHEEZING OR SHORTNESS OF BREATH 8.5 g 6  . promethazine (PHENERGAN) 25 MG tablet Take 1 tablet (25 mg total) by mouth every 8 (eight) hours as needed for nausea or vomiting. 30 tablet 2  . silver sulfADIAZINE (SILVADENE) 1 % cream Apply 1 application topically daily. 50 g 1  . SIMBRINZA 1-0.2 % SUSP Place 1 drop into both eyes 2 (two) times daily. Take  as instructed by Dr. Katy Fitch.    Nelva Nay SOLOSTAR 300 UNIT/ML SOPN INJECT 74 UNITS INTO SKIN TWICE DAILY 9 pen 0  . TOUJEO SOLOSTAR 300 UNIT/ML SOPN INJECT 60 UNITS INTO THE SKIN EVERY MORNING AND 90 UNITS INTO THE SKIN AT NIGHT 9 pen 0  . TOUJEO SOLOSTAR 300 UNIT/ML SOPN INJECT 60 TO 90 UNITS INTO THE SKIN TWICE DAILY 9 pen 0  . VICTOZA 18 MG/3ML SOPN ADMINISTER 1.8 MG UNDER THE SKIN AT THE SAME TIME EVERY DAY 9 mL 0  . VICTOZA 18 MG/3ML SOPN INJECT 1.8 MG ONCE DAILY AT THE SAME TIME 9 mL 0   No current facility-administered medications on file prior to visit.     Allergies  Allergen Reactions  . Vancomycin     REACTION: ARF    Objective: There were no vitals filed for this visit.  General: No acute distress, AAOx3  Left foot: Plantar forefoot, wound limited to skin, wound bed measuring 1.5x1.0x0.1cm (smaller than previous) with mild periwound keratosis, no swelling to left foot, no erythema, no warmth, no active drainage however on inner guaze there is drainage bloody drainage with no acute signs of infection noted, Capillary fill time <3 seconds in all digits remaining, amputation status of 2nd and 3rd toes. No pain with calf compression.   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[place])    -  Primary   Diabetes mellitus due to underlying condition with diabetic polyneuropathy, unspecified whether long term insulin use ([place])       Foot amputation status, left ([facility])       PVD (peripheral vascular disease) ([place])           -Patient seen and evaluated -Cleansed wound debirded wound margins excisionlly to healthy bleeding margins and applied wet to dry dressing   -Advised patient to make sure to keep dressings clean, dry, and intact allowing nursing to change using regranex; patient to call office if refill is needed  -Advised patient to continue with forefoot offloading post-op shoe on left  foot with use of cane -Advised patient to limit activity to necessity  -Advised patient to rest and elevate as necessary -Patient to return for wound check in 2 weeks. In the meantime, patient to call office if any issues or problems arise.   Landis Martins, DPM

## 2017-02-17 DIAGNOSIS — L97522 Non-pressure chronic ulcer of other part of left foot with fat layer exposed: Secondary | ICD-10-CM | POA: Diagnosis not present

## 2017-02-17 DIAGNOSIS — Z7901 Long term (current) use of anticoagulants: Secondary | ICD-10-CM | POA: Diagnosis not present

## 2017-02-17 DIAGNOSIS — Z89422 Acquired absence of other left toe(s): Secondary | ICD-10-CM | POA: Diagnosis not present

## 2017-02-17 DIAGNOSIS — Z794 Long term (current) use of insulin: Secondary | ICD-10-CM | POA: Diagnosis not present

## 2017-02-17 DIAGNOSIS — E1122 Type 2 diabetes mellitus with diabetic chronic kidney disease: Secondary | ICD-10-CM | POA: Diagnosis not present

## 2017-02-17 DIAGNOSIS — Z4800 Encounter for change or removal of nonsurgical wound dressing: Secondary | ICD-10-CM | POA: Diagnosis not present

## 2017-02-17 DIAGNOSIS — E1142 Type 2 diabetes mellitus with diabetic polyneuropathy: Secondary | ICD-10-CM | POA: Diagnosis not present

## 2017-02-17 DIAGNOSIS — N183 Chronic kidney disease, stage 3 (moderate): Secondary | ICD-10-CM | POA: Diagnosis not present

## 2017-02-17 DIAGNOSIS — I129 Hypertensive chronic kidney disease with stage 1 through stage 4 chronic kidney disease, or unspecified chronic kidney disease: Secondary | ICD-10-CM | POA: Diagnosis not present

## 2017-02-17 DIAGNOSIS — E11621 Type 2 diabetes mellitus with foot ulcer: Secondary | ICD-10-CM | POA: Diagnosis not present

## 2017-02-17 DIAGNOSIS — E11311 Type 2 diabetes mellitus with unspecified diabetic retinopathy with macular edema: Secondary | ICD-10-CM | POA: Diagnosis not present

## 2017-02-18 ENCOUNTER — Telehealth: Payer: Self-pay | Admitting: Endocrinology

## 2017-02-18 DIAGNOSIS — E11621 Type 2 diabetes mellitus with foot ulcer: Secondary | ICD-10-CM | POA: Diagnosis not present

## 2017-02-18 DIAGNOSIS — Z89422 Acquired absence of other left toe(s): Secondary | ICD-10-CM | POA: Diagnosis not present

## 2017-02-18 DIAGNOSIS — E1122 Type 2 diabetes mellitus with diabetic chronic kidney disease: Secondary | ICD-10-CM | POA: Diagnosis not present

## 2017-02-18 DIAGNOSIS — N183 Chronic kidney disease, stage 3 (moderate): Secondary | ICD-10-CM | POA: Diagnosis not present

## 2017-02-18 DIAGNOSIS — E11311 Type 2 diabetes mellitus with unspecified diabetic retinopathy with macular edema: Secondary | ICD-10-CM | POA: Diagnosis not present

## 2017-02-18 DIAGNOSIS — Z7901 Long term (current) use of anticoagulants: Secondary | ICD-10-CM | POA: Diagnosis not present

## 2017-02-18 DIAGNOSIS — E1142 Type 2 diabetes mellitus with diabetic polyneuropathy: Secondary | ICD-10-CM | POA: Diagnosis not present

## 2017-02-18 DIAGNOSIS — Z4800 Encounter for change or removal of nonsurgical wound dressing: Secondary | ICD-10-CM | POA: Diagnosis not present

## 2017-02-18 DIAGNOSIS — I129 Hypertensive chronic kidney disease with stage 1 through stage 4 chronic kidney disease, or unspecified chronic kidney disease: Secondary | ICD-10-CM | POA: Diagnosis not present

## 2017-02-18 DIAGNOSIS — L97522 Non-pressure chronic ulcer of other part of left foot with fat layer exposed: Secondary | ICD-10-CM | POA: Diagnosis not present

## 2017-02-18 DIAGNOSIS — Z794 Long term (current) use of insulin: Secondary | ICD-10-CM | POA: Diagnosis not present

## 2017-02-18 NOTE — Telephone Encounter (Signed)
Patient called in reference to [facility] insulin pump getting approved through insurance. Patient was told to let Dwyane Dee know. Patient's pharmacy is  [facility] [phone] - [place], [address] AT [address] [phone] (Phone) [phone] (Fax)

## 2017-02-18 NOTE — Telephone Encounter (Signed)
Routing to you °

## 2017-02-19 ENCOUNTER — Other Ambulatory Visit: Payer: Self-pay

## 2017-02-19 ENCOUNTER — Other Ambulatory Visit: Payer: Self-pay | Admitting: Endocrinology

## 2017-02-19 DIAGNOSIS — E1122 Type 2 diabetes mellitus with diabetic chronic kidney disease: Secondary | ICD-10-CM | POA: Diagnosis not present

## 2017-02-19 DIAGNOSIS — I129 Hypertensive chronic kidney disease with stage 1 through stage 4 chronic kidney disease, or unspecified chronic kidney disease: Secondary | ICD-10-CM | POA: Diagnosis not present

## 2017-02-19 DIAGNOSIS — E11311 Type 2 diabetes mellitus with unspecified diabetic retinopathy with macular edema: Secondary | ICD-10-CM | POA: Diagnosis not present

## 2017-02-19 DIAGNOSIS — Z7901 Long term (current) use of anticoagulants: Secondary | ICD-10-CM | POA: Diagnosis not present

## 2017-02-19 DIAGNOSIS — N183 Chronic kidney disease, stage 3 (moderate): Secondary | ICD-10-CM | POA: Diagnosis not present

## 2017-02-19 DIAGNOSIS — E11621 Type 2 diabetes mellitus with foot ulcer: Secondary | ICD-10-CM | POA: Diagnosis not present

## 2017-02-19 DIAGNOSIS — Z794 Long term (current) use of insulin: Secondary | ICD-10-CM | POA: Diagnosis not present

## 2017-02-19 DIAGNOSIS — Z89422 Acquired absence of other left toe(s): Secondary | ICD-10-CM | POA: Diagnosis not present

## 2017-02-19 DIAGNOSIS — L97522 Non-pressure chronic ulcer of other part of left foot with fat layer exposed: Secondary | ICD-10-CM | POA: Diagnosis not present

## 2017-02-19 DIAGNOSIS — E1142 Type 2 diabetes mellitus with diabetic polyneuropathy: Secondary | ICD-10-CM | POA: Diagnosis not present

## 2017-02-19 DIAGNOSIS — Z4800 Encounter for change or removal of nonsurgical wound dressing: Secondary | ICD-10-CM | POA: Diagnosis not present

## 2017-02-19 MED ORDER — V-GO 20 KIT: PACK | 4 refills | Status: DC

## 2017-02-19 NOTE — Telephone Encounter (Signed)
Which V-Go pump needs to be ordered? Please advise

## 2017-02-19 NOTE — Telephone Encounter (Signed)
20 unit basal

## 2017-02-19 NOTE — Telephone Encounter (Signed)
This has been ordered and patient was notified

## 2017-02-20 DIAGNOSIS — L97522 Non-pressure chronic ulcer of other part of left foot with fat layer exposed: Secondary | ICD-10-CM | POA: Diagnosis not present

## 2017-02-20 DIAGNOSIS — Z794 Long term (current) use of insulin: Secondary | ICD-10-CM | POA: Diagnosis not present

## 2017-02-20 DIAGNOSIS — E1142 Type 2 diabetes mellitus with diabetic polyneuropathy: Secondary | ICD-10-CM | POA: Diagnosis not present

## 2017-02-20 DIAGNOSIS — Z4800 Encounter for change or removal of nonsurgical wound dressing: Secondary | ICD-10-CM | POA: Diagnosis not present

## 2017-02-20 DIAGNOSIS — E11621 Type 2 diabetes mellitus with foot ulcer: Secondary | ICD-10-CM | POA: Diagnosis not present

## 2017-02-20 DIAGNOSIS — N183 Chronic kidney disease, stage 3 (moderate): Secondary | ICD-10-CM | POA: Diagnosis not present

## 2017-02-20 DIAGNOSIS — I129 Hypertensive chronic kidney disease with stage 1 through stage 4 chronic kidney disease, or unspecified chronic kidney disease: Secondary | ICD-10-CM | POA: Diagnosis not present

## 2017-02-20 DIAGNOSIS — Z89422 Acquired absence of other left toe(s): Secondary | ICD-10-CM | POA: Diagnosis not present

## 2017-02-20 DIAGNOSIS — Z7901 Long term (current) use of anticoagulants: Secondary | ICD-10-CM | POA: Diagnosis not present

## 2017-02-20 DIAGNOSIS — E1122 Type 2 diabetes mellitus with diabetic chronic kidney disease: Secondary | ICD-10-CM | POA: Diagnosis not present

## 2017-02-20 DIAGNOSIS — E11311 Type 2 diabetes mellitus with unspecified diabetic retinopathy with macular edema: Secondary | ICD-10-CM | POA: Diagnosis not present

## 2017-02-21 DIAGNOSIS — I129 Hypertensive chronic kidney disease with stage 1 through stage 4 chronic kidney disease, or unspecified chronic kidney disease: Secondary | ICD-10-CM | POA: Diagnosis not present

## 2017-02-21 DIAGNOSIS — E1142 Type 2 diabetes mellitus with diabetic polyneuropathy: Secondary | ICD-10-CM | POA: Diagnosis not present

## 2017-02-21 DIAGNOSIS — Z794 Long term (current) use of insulin: Secondary | ICD-10-CM | POA: Diagnosis not present

## 2017-02-21 DIAGNOSIS — Z89422 Acquired absence of other left toe(s): Secondary | ICD-10-CM | POA: Diagnosis not present

## 2017-02-21 DIAGNOSIS — L97522 Non-pressure chronic ulcer of other part of left foot with fat layer exposed: Secondary | ICD-10-CM | POA: Diagnosis not present

## 2017-02-21 DIAGNOSIS — N183 Chronic kidney disease, stage 3 (moderate): Secondary | ICD-10-CM | POA: Diagnosis not present

## 2017-02-21 DIAGNOSIS — Z7901 Long term (current) use of anticoagulants: Secondary | ICD-10-CM | POA: Diagnosis not present

## 2017-02-21 DIAGNOSIS — Z4800 Encounter for change or removal of nonsurgical wound dressing: Secondary | ICD-10-CM | POA: Diagnosis not present

## 2017-02-21 DIAGNOSIS — E11311 Type 2 diabetes mellitus with unspecified diabetic retinopathy with macular edema: Secondary | ICD-10-CM | POA: Diagnosis not present

## 2017-02-21 DIAGNOSIS — E11621 Type 2 diabetes mellitus with foot ulcer: Secondary | ICD-10-CM | POA: Diagnosis not present

## 2017-02-21 DIAGNOSIS — E1122 Type 2 diabetes mellitus with diabetic chronic kidney disease: Secondary | ICD-10-CM | POA: Diagnosis not present

## 2017-02-22 ENCOUNTER — Telehealth: Payer: Self-pay | Admitting: Dietician

## 2017-02-22 DIAGNOSIS — E11311 Type 2 diabetes mellitus with unspecified diabetic retinopathy with macular edema: Secondary | ICD-10-CM | POA: Diagnosis not present

## 2017-02-22 DIAGNOSIS — M79672 Pain in left foot: Secondary | ICD-10-CM | POA: Diagnosis not present

## 2017-02-22 DIAGNOSIS — L97522 Non-pressure chronic ulcer of other part of left foot with fat layer exposed: Secondary | ICD-10-CM | POA: Diagnosis not present

## 2017-02-22 DIAGNOSIS — M545 Low back pain: Secondary | ICD-10-CM | POA: Diagnosis not present

## 2017-02-22 DIAGNOSIS — E1122 Type 2 diabetes mellitus with diabetic chronic kidney disease: Secondary | ICD-10-CM | POA: Diagnosis not present

## 2017-02-22 DIAGNOSIS — G894 Chronic pain syndrome: Secondary | ICD-10-CM | POA: Diagnosis not present

## 2017-02-22 DIAGNOSIS — Z794 Long term (current) use of insulin: Secondary | ICD-10-CM | POA: Diagnosis not present

## 2017-02-22 DIAGNOSIS — N183 Chronic kidney disease, stage 3 (moderate): Secondary | ICD-10-CM | POA: Diagnosis not present

## 2017-02-22 DIAGNOSIS — Z7901 Long term (current) use of anticoagulants: Secondary | ICD-10-CM | POA: Diagnosis not present

## 2017-02-22 DIAGNOSIS — E1142 Type 2 diabetes mellitus with diabetic polyneuropathy: Secondary | ICD-10-CM | POA: Diagnosis not present

## 2017-02-22 DIAGNOSIS — Z79891 Long term (current) use of opiate analgesic: Secondary | ICD-10-CM | POA: Diagnosis not present

## 2017-02-22 DIAGNOSIS — E11621 Type 2 diabetes mellitus with foot ulcer: Secondary | ICD-10-CM | POA: Diagnosis not present

## 2017-02-22 DIAGNOSIS — I129 Hypertensive chronic kidney disease with stage 1 through stage 4 chronic kidney disease, or unspecified chronic kidney disease: Secondary | ICD-10-CM | POA: Diagnosis not present

## 2017-02-22 DIAGNOSIS — Z89422 Acquired absence of other left toe(s): Secondary | ICD-10-CM | POA: Diagnosis not present

## 2017-02-22 DIAGNOSIS — Z4800 Encounter for change or removal of nonsurgical wound dressing: Secondary | ICD-10-CM | POA: Diagnosis not present

## 2017-02-22 DIAGNOSIS — M25569 Pain in unspecified knee: Secondary | ICD-10-CM | POA: Diagnosis not present

## 2017-02-22 NOTE — Telephone Encounter (Signed)
Wants help learning how to use Vgo. Says he has it and everything he needs at home and he wants to begin using it before he sees Dr. Dwyane Dee later this month. I am happy to assist as needed, but need orders about what insulin to use and clicks with meals and snacks.  Left message on Dr. Ronnie Derby Diabetes Educator's [place] Reader) voicemail to please give him or me a call.

## 2017-02-22 NOTE — Telephone Encounter (Signed)
Patient notified

## 2017-02-23 DIAGNOSIS — E11311 Type 2 diabetes mellitus with unspecified diabetic retinopathy with macular edema: Secondary | ICD-10-CM | POA: Diagnosis not present

## 2017-02-23 DIAGNOSIS — E11621 Type 2 diabetes mellitus with foot ulcer: Secondary | ICD-10-CM | POA: Diagnosis not present

## 2017-02-23 DIAGNOSIS — Z89422 Acquired absence of other left toe(s): Secondary | ICD-10-CM | POA: Diagnosis not present

## 2017-02-23 DIAGNOSIS — E1142 Type 2 diabetes mellitus with diabetic polyneuropathy: Secondary | ICD-10-CM | POA: Diagnosis not present

## 2017-02-23 DIAGNOSIS — I129 Hypertensive chronic kidney disease with stage 1 through stage 4 chronic kidney disease, or unspecified chronic kidney disease: Secondary | ICD-10-CM | POA: Diagnosis not present

## 2017-02-23 DIAGNOSIS — N183 Chronic kidney disease, stage 3 (moderate): Secondary | ICD-10-CM | POA: Diagnosis not present

## 2017-02-23 DIAGNOSIS — Z4800 Encounter for change or removal of nonsurgical wound dressing: Secondary | ICD-10-CM | POA: Diagnosis not present

## 2017-02-23 DIAGNOSIS — Z7901 Long term (current) use of anticoagulants: Secondary | ICD-10-CM | POA: Diagnosis not present

## 2017-02-23 DIAGNOSIS — E1122 Type 2 diabetes mellitus with diabetic chronic kidney disease: Secondary | ICD-10-CM | POA: Diagnosis not present

## 2017-02-23 DIAGNOSIS — L97522 Non-pressure chronic ulcer of other part of left foot with fat layer exposed: Secondary | ICD-10-CM | POA: Diagnosis not present

## 2017-02-23 DIAGNOSIS — Z794 Long term (current) use of insulin: Secondary | ICD-10-CM | POA: Diagnosis not present

## 2017-02-23 NOTE — Telephone Encounter (Signed)
Message left on machine to call me to set up an appt. To start the V-Go

## 2017-02-24 DIAGNOSIS — Z4800 Encounter for change or removal of nonsurgical wound dressing: Secondary | ICD-10-CM | POA: Diagnosis not present

## 2017-02-24 DIAGNOSIS — N183 Chronic kidney disease, stage 3 (moderate): Secondary | ICD-10-CM | POA: Diagnosis not present

## 2017-02-24 DIAGNOSIS — E1142 Type 2 diabetes mellitus with diabetic polyneuropathy: Secondary | ICD-10-CM | POA: Diagnosis not present

## 2017-02-24 DIAGNOSIS — Z89422 Acquired absence of other left toe(s): Secondary | ICD-10-CM | POA: Diagnosis not present

## 2017-02-24 DIAGNOSIS — E1122 Type 2 diabetes mellitus with diabetic chronic kidney disease: Secondary | ICD-10-CM | POA: Diagnosis not present

## 2017-02-24 DIAGNOSIS — Z794 Long term (current) use of insulin: Secondary | ICD-10-CM | POA: Diagnosis not present

## 2017-02-24 DIAGNOSIS — Z7901 Long term (current) use of anticoagulants: Secondary | ICD-10-CM | POA: Diagnosis not present

## 2017-02-24 DIAGNOSIS — E11311 Type 2 diabetes mellitus with unspecified diabetic retinopathy with macular edema: Secondary | ICD-10-CM | POA: Diagnosis not present

## 2017-02-24 DIAGNOSIS — I129 Hypertensive chronic kidney disease with stage 1 through stage 4 chronic kidney disease, or unspecified chronic kidney disease: Secondary | ICD-10-CM | POA: Diagnosis not present

## 2017-02-24 DIAGNOSIS — L97522 Non-pressure chronic ulcer of other part of left foot with fat layer exposed: Secondary | ICD-10-CM | POA: Diagnosis not present

## 2017-02-24 DIAGNOSIS — E11621 Type 2 diabetes mellitus with foot ulcer: Secondary | ICD-10-CM | POA: Diagnosis not present

## 2017-02-25 DIAGNOSIS — N183 Chronic kidney disease, stage 3 (moderate): Secondary | ICD-10-CM | POA: Diagnosis not present

## 2017-02-25 DIAGNOSIS — E1142 Type 2 diabetes mellitus with diabetic polyneuropathy: Secondary | ICD-10-CM | POA: Diagnosis not present

## 2017-02-25 DIAGNOSIS — Z89422 Acquired absence of other left toe(s): Secondary | ICD-10-CM | POA: Diagnosis not present

## 2017-02-25 DIAGNOSIS — Z4800 Encounter for change or removal of nonsurgical wound dressing: Secondary | ICD-10-CM | POA: Diagnosis not present

## 2017-02-25 DIAGNOSIS — E11621 Type 2 diabetes mellitus with foot ulcer: Secondary | ICD-10-CM | POA: Diagnosis not present

## 2017-02-25 DIAGNOSIS — L97522 Non-pressure chronic ulcer of other part of left foot with fat layer exposed: Secondary | ICD-10-CM | POA: Diagnosis not present

## 2017-02-25 DIAGNOSIS — E1122 Type 2 diabetes mellitus with diabetic chronic kidney disease: Secondary | ICD-10-CM | POA: Diagnosis not present

## 2017-02-25 DIAGNOSIS — Z794 Long term (current) use of insulin: Secondary | ICD-10-CM | POA: Diagnosis not present

## 2017-02-25 DIAGNOSIS — I129 Hypertensive chronic kidney disease with stage 1 through stage 4 chronic kidney disease, or unspecified chronic kidney disease: Secondary | ICD-10-CM | POA: Diagnosis not present

## 2017-02-25 DIAGNOSIS — Z7901 Long term (current) use of anticoagulants: Secondary | ICD-10-CM | POA: Diagnosis not present

## 2017-02-25 DIAGNOSIS — E11311 Type 2 diabetes mellitus with unspecified diabetic retinopathy with macular edema: Secondary | ICD-10-CM | POA: Diagnosis not present

## 2017-02-26 DIAGNOSIS — Z4800 Encounter for change or removal of nonsurgical wound dressing: Secondary | ICD-10-CM | POA: Diagnosis not present

## 2017-02-26 DIAGNOSIS — I129 Hypertensive chronic kidney disease with stage 1 through stage 4 chronic kidney disease, or unspecified chronic kidney disease: Secondary | ICD-10-CM | POA: Diagnosis not present

## 2017-02-26 DIAGNOSIS — E1142 Type 2 diabetes mellitus with diabetic polyneuropathy: Secondary | ICD-10-CM | POA: Diagnosis not present

## 2017-02-26 DIAGNOSIS — E11621 Type 2 diabetes mellitus with foot ulcer: Secondary | ICD-10-CM | POA: Diagnosis not present

## 2017-02-26 DIAGNOSIS — Z89422 Acquired absence of other left toe(s): Secondary | ICD-10-CM | POA: Diagnosis not present

## 2017-02-26 DIAGNOSIS — E1122 Type 2 diabetes mellitus with diabetic chronic kidney disease: Secondary | ICD-10-CM | POA: Diagnosis not present

## 2017-02-26 DIAGNOSIS — Z794 Long term (current) use of insulin: Secondary | ICD-10-CM | POA: Diagnosis not present

## 2017-02-26 DIAGNOSIS — Z7901 Long term (current) use of anticoagulants: Secondary | ICD-10-CM | POA: Diagnosis not present

## 2017-02-26 DIAGNOSIS — E11311 Type 2 diabetes mellitus with unspecified diabetic retinopathy with macular edema: Secondary | ICD-10-CM | POA: Diagnosis not present

## 2017-02-26 DIAGNOSIS — L97522 Non-pressure chronic ulcer of other part of left foot with fat layer exposed: Secondary | ICD-10-CM | POA: Diagnosis not present

## 2017-02-26 DIAGNOSIS — N183 Chronic kidney disease, stage 3 (moderate): Secondary | ICD-10-CM | POA: Diagnosis not present

## 2017-02-27 DIAGNOSIS — E11311 Type 2 diabetes mellitus with unspecified diabetic retinopathy with macular edema: Secondary | ICD-10-CM | POA: Diagnosis not present

## 2017-02-27 DIAGNOSIS — I129 Hypertensive chronic kidney disease with stage 1 through stage 4 chronic kidney disease, or unspecified chronic kidney disease: Secondary | ICD-10-CM | POA: Diagnosis not present

## 2017-02-27 DIAGNOSIS — Z4800 Encounter for change or removal of nonsurgical wound dressing: Secondary | ICD-10-CM | POA: Diagnosis not present

## 2017-02-27 DIAGNOSIS — N183 Chronic kidney disease, stage 3 (moderate): Secondary | ICD-10-CM | POA: Diagnosis not present

## 2017-02-27 DIAGNOSIS — Z7901 Long term (current) use of anticoagulants: Secondary | ICD-10-CM | POA: Diagnosis not present

## 2017-02-27 DIAGNOSIS — Z794 Long term (current) use of insulin: Secondary | ICD-10-CM | POA: Diagnosis not present

## 2017-02-27 DIAGNOSIS — E1122 Type 2 diabetes mellitus with diabetic chronic kidney disease: Secondary | ICD-10-CM | POA: Diagnosis not present

## 2017-02-27 DIAGNOSIS — Z89422 Acquired absence of other left toe(s): Secondary | ICD-10-CM | POA: Diagnosis not present

## 2017-02-27 DIAGNOSIS — L97522 Non-pressure chronic ulcer of other part of left foot with fat layer exposed: Secondary | ICD-10-CM | POA: Diagnosis not present

## 2017-02-27 DIAGNOSIS — E11621 Type 2 diabetes mellitus with foot ulcer: Secondary | ICD-10-CM | POA: Diagnosis not present

## 2017-02-27 DIAGNOSIS — E1142 Type 2 diabetes mellitus with diabetic polyneuropathy: Secondary | ICD-10-CM | POA: Diagnosis not present

## 2017-02-28 ENCOUNTER — Other Ambulatory Visit: Payer: Self-pay | Admitting: Endocrinology

## 2017-02-28 DIAGNOSIS — E11311 Type 2 diabetes mellitus with unspecified diabetic retinopathy with macular edema: Secondary | ICD-10-CM | POA: Diagnosis not present

## 2017-02-28 DIAGNOSIS — E1122 Type 2 diabetes mellitus with diabetic chronic kidney disease: Secondary | ICD-10-CM | POA: Diagnosis not present

## 2017-02-28 DIAGNOSIS — Z7901 Long term (current) use of anticoagulants: Secondary | ICD-10-CM | POA: Diagnosis not present

## 2017-02-28 DIAGNOSIS — L97522 Non-pressure chronic ulcer of other part of left foot with fat layer exposed: Secondary | ICD-10-CM | POA: Diagnosis not present

## 2017-02-28 DIAGNOSIS — Z794 Long term (current) use of insulin: Secondary | ICD-10-CM | POA: Diagnosis not present

## 2017-02-28 DIAGNOSIS — E11621 Type 2 diabetes mellitus with foot ulcer: Secondary | ICD-10-CM | POA: Diagnosis not present

## 2017-02-28 DIAGNOSIS — E1165 Type 2 diabetes mellitus with hyperglycemia: Secondary | ICD-10-CM

## 2017-02-28 DIAGNOSIS — E1142 Type 2 diabetes mellitus with diabetic polyneuropathy: Secondary | ICD-10-CM | POA: Diagnosis not present

## 2017-02-28 DIAGNOSIS — Z4800 Encounter for change or removal of nonsurgical wound dressing: Secondary | ICD-10-CM | POA: Diagnosis not present

## 2017-02-28 DIAGNOSIS — N183 Chronic kidney disease, stage 3 (moderate): Secondary | ICD-10-CM | POA: Diagnosis not present

## 2017-02-28 DIAGNOSIS — Z89422 Acquired absence of other left toe(s): Secondary | ICD-10-CM | POA: Diagnosis not present

## 2017-02-28 DIAGNOSIS — I129 Hypertensive chronic kidney disease with stage 1 through stage 4 chronic kidney disease, or unspecified chronic kidney disease: Secondary | ICD-10-CM | POA: Diagnosis not present

## 2017-03-01 ENCOUNTER — Encounter: Payer: Medicare Other | Attending: Endocrinology | Admitting: Nutrition

## 2017-03-01 ENCOUNTER — Other Ambulatory Visit: Payer: Medicare Other

## 2017-03-01 ENCOUNTER — Telehealth: Payer: Self-pay | Admitting: Endocrinology

## 2017-03-01 ENCOUNTER — Other Ambulatory Visit: Payer: Self-pay

## 2017-03-01 DIAGNOSIS — E113512 Type 2 diabetes mellitus with proliferative diabetic retinopathy with macular edema, left eye: Secondary | ICD-10-CM | POA: Diagnosis not present

## 2017-03-01 DIAGNOSIS — E119 Type 2 diabetes mellitus without complications: Secondary | ICD-10-CM | POA: Insufficient documentation

## 2017-03-01 DIAGNOSIS — Z713 Dietary counseling and surveillance: Secondary | ICD-10-CM | POA: Diagnosis not present

## 2017-03-01 DIAGNOSIS — Z794 Long term (current) use of insulin: Secondary | ICD-10-CM | POA: Diagnosis not present

## 2017-03-01 LAB — HEMOGLOBIN A1C: HEMOGLOBIN A1C: 9.3 % — AB (ref 4.6–6.5)

## 2017-03-01 LAB — BASIC METABOLIC PANEL
BUN: 18 mg/dL (ref 6–23)
CALCIUM: 9.2 mg/dL (ref 8.4–10.5)
CHLORIDE: 105 meq/L (ref 96–112)
CO2: 27 meq/L (ref 19–32)
Creatinine, Ser: 1.56 mg/dL — ABNORMAL HIGH (ref 0.40–1.50)
GFR: 57.38 mL/min — ABNORMAL LOW (ref >60.00)
Glucose, Bld: 158 mg/dL — ABNORMAL HIGH (ref 70–99)
Potassium: 4.1 mEq/L (ref 3.5–5.1)
SODIUM: 138 meq/L (ref 135–145)

## 2017-03-01 MED ORDER — "INSULIN SYRINGE-NEEDLE U-100 25G X 1"" 1 ML MISC": 3 refills | Status: DC

## 2017-03-01 NOTE — Telephone Encounter (Signed)
Pharmacy called in reference to questions about e script for insulin syringes. Please call pharmacy and advise.

## 2017-03-02 ENCOUNTER — Encounter: Payer: Self-pay | Admitting: Dietician

## 2017-03-02 ENCOUNTER — Other Ambulatory Visit (INDEPENDENT_AMBULATORY_CARE_PROVIDER_SITE_OTHER): Payer: Medicare Other

## 2017-03-02 ENCOUNTER — Telehealth: Payer: Self-pay | Admitting: Dietician

## 2017-03-02 DIAGNOSIS — L97522 Non-pressure chronic ulcer of other part of left foot with fat layer exposed: Secondary | ICD-10-CM | POA: Diagnosis not present

## 2017-03-02 DIAGNOSIS — Z794 Long term (current) use of insulin: Secondary | ICD-10-CM | POA: Diagnosis not present

## 2017-03-02 DIAGNOSIS — E11311 Type 2 diabetes mellitus with unspecified diabetic retinopathy with macular edema: Secondary | ICD-10-CM | POA: Diagnosis not present

## 2017-03-02 DIAGNOSIS — E1165 Type 2 diabetes mellitus with hyperglycemia: Secondary | ICD-10-CM

## 2017-03-02 DIAGNOSIS — N183 Chronic kidney disease, stage 3 (moderate): Secondary | ICD-10-CM | POA: Diagnosis not present

## 2017-03-02 DIAGNOSIS — E1122 Type 2 diabetes mellitus with diabetic chronic kidney disease: Secondary | ICD-10-CM | POA: Diagnosis not present

## 2017-03-02 DIAGNOSIS — E1142 Type 2 diabetes mellitus with diabetic polyneuropathy: Secondary | ICD-10-CM | POA: Diagnosis not present

## 2017-03-02 DIAGNOSIS — Z89422 Acquired absence of other left toe(s): Secondary | ICD-10-CM | POA: Diagnosis not present

## 2017-03-02 DIAGNOSIS — I129 Hypertensive chronic kidney disease with stage 1 through stage 4 chronic kidney disease, or unspecified chronic kidney disease: Secondary | ICD-10-CM | POA: Diagnosis not present

## 2017-03-02 DIAGNOSIS — Z7901 Long term (current) use of anticoagulants: Secondary | ICD-10-CM | POA: Diagnosis not present

## 2017-03-02 DIAGNOSIS — Z4800 Encounter for change or removal of nonsurgical wound dressing: Secondary | ICD-10-CM | POA: Diagnosis not present

## 2017-03-02 DIAGNOSIS — E11621 Type 2 diabetes mellitus with foot ulcer: Secondary | ICD-10-CM | POA: Diagnosis not present

## 2017-03-02 NOTE — Telephone Encounter (Signed)
[name] calls saying he would like to use the Freestyle Libre CGM.  He is a good candidate since he injects insulin 4-5 times a day and tests his blood sugar 5 or more times a day. Called [facility] mail order company on his behalf. They'll verify his insurance, contact him with co-pay information, then fax [place] the request for an  order and  office notes.

## 2017-03-02 NOTE — Patient Instructions (Signed)
Stop all Toujeo insulin Continue the Victoza Give 4 clicks before breakfast, 6 clicks before lunch, and 7 clicks before supper Call if blood sugars drop low, or they remain over 200.

## 2017-03-02 NOTE — Progress Notes (Signed)
[name] was trained on how to fill, apply and use the V-go 20.  He was trained to fill the V-Go using a 1cc insulin syringe and re demonstrated the procedure with very little assistance from me. He took his Toujeo insulin this morning, so was told not to take his [employer], and to start this tomorrow morning. Written instructions were give to stop all Toujeo and U-500 pens and to give 4 clicks before breakfast, 6 clicks before lunch and 7 clicks before supper.  He redemonstrated how to give the clicks with no difficulty. He was given a sheet to record his blood sugars and told to test before meals and at bedtime, and to bring the sheet with him when he come back to see Dr. Dwyane Dee on Friday. He had no final questions.

## 2017-03-03 DIAGNOSIS — Z794 Long term (current) use of insulin: Secondary | ICD-10-CM | POA: Diagnosis not present

## 2017-03-03 DIAGNOSIS — E11621 Type 2 diabetes mellitus with foot ulcer: Secondary | ICD-10-CM | POA: Diagnosis not present

## 2017-03-03 DIAGNOSIS — Z4800 Encounter for change or removal of nonsurgical wound dressing: Secondary | ICD-10-CM | POA: Diagnosis not present

## 2017-03-03 DIAGNOSIS — N183 Chronic kidney disease, stage 3 (moderate): Secondary | ICD-10-CM | POA: Diagnosis not present

## 2017-03-03 DIAGNOSIS — Z7901 Long term (current) use of anticoagulants: Secondary | ICD-10-CM | POA: Diagnosis not present

## 2017-03-03 DIAGNOSIS — L97522 Non-pressure chronic ulcer of other part of left foot with fat layer exposed: Secondary | ICD-10-CM | POA: Diagnosis not present

## 2017-03-03 DIAGNOSIS — Z89422 Acquired absence of other left toe(s): Secondary | ICD-10-CM | POA: Diagnosis not present

## 2017-03-03 DIAGNOSIS — I129 Hypertensive chronic kidney disease with stage 1 through stage 4 chronic kidney disease, or unspecified chronic kidney disease: Secondary | ICD-10-CM | POA: Diagnosis not present

## 2017-03-03 DIAGNOSIS — E11311 Type 2 diabetes mellitus with unspecified diabetic retinopathy with macular edema: Secondary | ICD-10-CM | POA: Diagnosis not present

## 2017-03-03 DIAGNOSIS — E1142 Type 2 diabetes mellitus with diabetic polyneuropathy: Secondary | ICD-10-CM | POA: Diagnosis not present

## 2017-03-03 DIAGNOSIS — E1122 Type 2 diabetes mellitus with diabetic chronic kidney disease: Secondary | ICD-10-CM | POA: Diagnosis not present

## 2017-03-04 ENCOUNTER — Other Ambulatory Visit: Payer: Self-pay | Admitting: Internal Medicine

## 2017-03-04 DIAGNOSIS — N183 Chronic kidney disease, stage 3 (moderate): Secondary | ICD-10-CM | POA: Diagnosis not present

## 2017-03-04 DIAGNOSIS — E11311 Type 2 diabetes mellitus with unspecified diabetic retinopathy with macular edema: Secondary | ICD-10-CM | POA: Diagnosis not present

## 2017-03-04 DIAGNOSIS — Z794 Long term (current) use of insulin: Secondary | ICD-10-CM | POA: Diagnosis not present

## 2017-03-04 DIAGNOSIS — Z7901 Long term (current) use of anticoagulants: Secondary | ICD-10-CM | POA: Diagnosis not present

## 2017-03-04 DIAGNOSIS — I129 Hypertensive chronic kidney disease with stage 1 through stage 4 chronic kidney disease, or unspecified chronic kidney disease: Secondary | ICD-10-CM | POA: Diagnosis not present

## 2017-03-04 DIAGNOSIS — E1142 Type 2 diabetes mellitus with diabetic polyneuropathy: Secondary | ICD-10-CM | POA: Diagnosis not present

## 2017-03-04 DIAGNOSIS — Z89422 Acquired absence of other left toe(s): Secondary | ICD-10-CM | POA: Diagnosis not present

## 2017-03-04 DIAGNOSIS — L97522 Non-pressure chronic ulcer of other part of left foot with fat layer exposed: Secondary | ICD-10-CM | POA: Diagnosis not present

## 2017-03-04 DIAGNOSIS — Z4800 Encounter for change or removal of nonsurgical wound dressing: Secondary | ICD-10-CM | POA: Diagnosis not present

## 2017-03-04 DIAGNOSIS — E1122 Type 2 diabetes mellitus with diabetic chronic kidney disease: Secondary | ICD-10-CM | POA: Diagnosis not present

## 2017-03-04 DIAGNOSIS — E11621 Type 2 diabetes mellitus with foot ulcer: Secondary | ICD-10-CM | POA: Diagnosis not present

## 2017-03-04 NOTE — Telephone Encounter (Signed)
Thank you. I agree 

## 2017-03-05 ENCOUNTER — Encounter: Payer: Self-pay | Admitting: Endocrinology

## 2017-03-05 ENCOUNTER — Ambulatory Visit (INDEPENDENT_AMBULATORY_CARE_PROVIDER_SITE_OTHER): Payer: Medicare Other | Admitting: Endocrinology

## 2017-03-05 VITALS — BP 130/74 | HR 79 | Ht 77.25 in | Wt 393.2 lb

## 2017-03-05 DIAGNOSIS — I129 Hypertensive chronic kidney disease with stage 1 through stage 4 chronic kidney disease, or unspecified chronic kidney disease: Secondary | ICD-10-CM | POA: Diagnosis not present

## 2017-03-05 DIAGNOSIS — Z89422 Acquired absence of other left toe(s): Secondary | ICD-10-CM | POA: Diagnosis not present

## 2017-03-05 DIAGNOSIS — Z794 Long term (current) use of insulin: Secondary | ICD-10-CM

## 2017-03-05 DIAGNOSIS — E1122 Type 2 diabetes mellitus with diabetic chronic kidney disease: Secondary | ICD-10-CM | POA: Diagnosis not present

## 2017-03-05 DIAGNOSIS — Z7901 Long term (current) use of anticoagulants: Secondary | ICD-10-CM | POA: Diagnosis not present

## 2017-03-05 DIAGNOSIS — E1142 Type 2 diabetes mellitus with diabetic polyneuropathy: Secondary | ICD-10-CM | POA: Diagnosis not present

## 2017-03-05 DIAGNOSIS — E1165 Type 2 diabetes mellitus with hyperglycemia: Secondary | ICD-10-CM | POA: Diagnosis not present

## 2017-03-05 DIAGNOSIS — E11311 Type 2 diabetes mellitus with unspecified diabetic retinopathy with macular edema: Secondary | ICD-10-CM | POA: Diagnosis not present

## 2017-03-05 DIAGNOSIS — E11621 Type 2 diabetes mellitus with foot ulcer: Secondary | ICD-10-CM | POA: Diagnosis not present

## 2017-03-05 DIAGNOSIS — N183 Chronic kidney disease, stage 3 (moderate): Secondary | ICD-10-CM | POA: Diagnosis not present

## 2017-03-05 DIAGNOSIS — Z4800 Encounter for change or removal of nonsurgical wound dressing: Secondary | ICD-10-CM | POA: Diagnosis not present

## 2017-03-05 DIAGNOSIS — L97522 Non-pressure chronic ulcer of other part of left foot with fat layer exposed: Secondary | ICD-10-CM | POA: Diagnosis not present

## 2017-03-05 NOTE — Progress Notes (Signed)
Patient ID: [name], male   DOB: [birth year], 66 y.o.   MRN: [id number]           Reason for Appointment: Follow-up for Type 2 Diabetes  Referring physician: Dareen Piano  History of Present Illness:          Date of diagnosis of type 2 diabetes mellitus: 2007       Background history:   He was diagnosed to have diabetes when he had an ulcer on his left third toe which led to amputation.  His glucose was apparently about 400 and he was started on insulin at that time He does not know if he has taken any oral hypoglycemic drugs in the past His A1c in 2016 has been consistently over 8%  Recent history:   Currently using V-go PUMP 20 unit basal, Humulin R U-500 insulin with boluses 8 units breakfast, 12 units Change protein at dinner  INSULIN regimen previously was:  TOUJEO  74 in the morning and 74 in the evening HUMULIN U-500 insulin: 20/40 units in the morning, 60/70 at lunch and 70 at supper  Non-insulin hypoglycemic drugs the patient is taking are: Victoza 1.8 mg daily       On his initial consultation was started on Victoza and subsequently started on U-500 insulin in 2/17 along with Toujeo  Blood sugars have been difficult to control and A1c is 9.3  Current blood sugar patterns and problems identified:  His blood sugars prior to starting the V-go pump were fluctuating significantly including overnight  He has been on this for 4 days now with difficult to a fire consistent trends so far  Even though he is taking much less insulin his FASTING reading was only 78 this morning  He still has had readings over 200 in the morning this week but may be related to higher fat foods the night before  He has not been watching his diet recently with eating chicken wings or pizza at times and sometimes beer  Also not able to exercise  However his blood sugars later in the day have been high only once since starting the pump and today after taking juice for his low normal  sugars blood sugar was 208  He has had no difficulty filling the pump with the insulin as directed using a syringe as well as trying to do the boluses and change the pump on time  He has seen the dietitian in the past  Hypoglycemia: Minimal  Glucose monitoring:  done 3-4  times a day         Glucometer:  Accu-Chek     Blood Glucose readings by download:  30 day average 162+/-55 Average by time of day range between 156-184 with HIGHEST average after 6 PM   Self-care:  Typical meal intake: Breakfast is sometimes fruit or cereal, Sometimes eating sausage and grits Usually trying to avoid high-fat foods or fried food  Lunch 12 noon Dinner at 5-6 pm He drinks grapefruit or V-8 juice and diet drinks                Dietician consultation: 12/16               Exercise: not walking,  has chronic foot ulcer on the left  Weight history: Previous range 260-410  Wt Readings from Last 3 Encounters:  03/05/17 (!) 393 lb 3.2 oz (178.4 kg)  01/12/17 (!) 394 lb 12.8 oz (179.1 kg)  01/04/17 (!) 394 lb 12.8 oz (179.1  kg)    Glycemic control:   Lab Results  Component Value Date   HGBA1C 9.3 (H) 03/01/2017   HGBA1C 9.2 (H) 10/22/2016   HGBA1C 9.7 (H) 07/16/2016   Lab Results  Component Value Date   MICROALBUR 14.9 (H) 04/13/2016   LDLCALC 57 10/22/2016   CREATININE 1.56 (H) 03/01/2017   Lab on 03/02/2017  Component Date Value Ref Range Status  . Hgb A1c MFr Bld 03/01/2017 9.3* 4.6 - 6.5 % Final   Glycemic Control Guidelines for People with Diabetes:Non Diabetic:  <6%Goal of Therapy: <7%Additional Action Suggested:  >8%   . Sodium 03/01/2017 138  135 - 145 mEq/L Final  . Potassium 03/01/2017 4.1  3.5 - 5.1 mEq/L Final  . Chloride 03/01/2017 105  96 - 112 mEq/L Final  . CO2 03/01/2017 27  19 - 32 mEq/L Final  . Glucose, Bld 03/01/2017 158* 70 - 99 mg/dL Final  . BUN 03/01/2017 18  6 - 23 mg/dL Final  . Creatinine, Ser 03/01/2017 1.56* 0.40 - 1.50 mg/dL Final  . Calcium 03/01/2017 9.2   8.4 - 10.5 mg/dL Final  . GFR 03/01/2017 57.38* >60.00 mL/min Final       Allergies as of 03/05/2017      Reactions   Vancomycin    REACTION: ARF      Medication List       Accurate as of 03/05/17 11:59 PM. Always use your most recent med list.          ACCU-CHEK AVIVA PLUS w/Device Kit CHECK BLOOD SUGAR 5 TIMES A DAY   ACCU-CHEK FASTCLIX LANCETS Misc Check blood sugar 5 times a day   atorvastatin 20 MG tablet Commonly known as:  LIPITOR TAKE 1 TABLET(20 MG) BY MOUTH DAILY   becaplermin 0.01 % gel Commonly known as:  REGRANEX Apply 1 application topically daily.   diclofenac sodium 1 % Gel Commonly known as:  VOLTAREN Apply 4 g topically 4 (four) times daily.   docusate sodium 100 MG capsule Commonly known as:  COLACE Take 1 capsule (100 mg total) by mouth daily as needed for mild constipation.   ELIQUIS 5 MG Tabs tablet Generic drug:  apixaban TAKE 1 TABLET(5 MG) BY MOUTH TWICE DAILY   enalapril 20 MG tablet Commonly known as:  VASOTEC TAKE 2 TABLETS(40 MG) BY MOUTH DAILY   furosemide 40 MG tablet Commonly known as:  LASIX TAKE 1 TABLET(40 MG) BY MOUTH TWICE DAILY   gabapentin 300 MG capsule Commonly known as:  NEURONTIN TAKE 1 CAPSULE(300 MG) BY MOUTH AT BEDTIME   glucose blood test strip Commonly known as:  ACCU-CHEK AVIVA PLUS USE TO CHECK BLOOD Five TIMES DAILY   HYDROcodone-acetaminophen 7.5-325 MG tablet Commonly known as:  NORCO Take 1 tablet by mouth every 6 (six) hours as needed for moderate pain.   Insulin Pen Needle 31G X 5 MM Misc Use for insulin injection 5 times a day.   insulin regular human CONCENTRATED 500 UNIT/ML kwikpen Commonly known as:  HUMULIN R U-500 KWIKPEN INJECT 35 UNITS AT BREAKFAST, 60 UNITS AT LUNCH, AND 70 UNITS AT DINNER   Insulin Syringe-Needle U-100 25G X 1" 1 ML Misc Use 1 syringe daily to fill V-Go pump   metoprolol succinate 25 MG 24 hr tablet Commonly known as:  TOPROL-XL TAKE 1 TABLET(25 MG) BY MOUTH  DAILY   omeprazole 20 MG capsule Commonly known as:  PRILOSEC TAKE 1 CAPSULE(20 MG) BY MOUTH DAILY   PROAIR HFA 108 (90 Base) MCG/ACT inhaler  Generic drug:  albuterol INHALE 2 PUFFS INTO THE LUNGS BY MOUTH EVERY 6 HOURS AS NEEDED FOR WHEEZING OR SHORTNESS OF BREATH   promethazine 25 MG tablet Commonly known as:  PHENERGAN Take 1 tablet (25 mg total) by mouth every 8 (eight) hours as needed for nausea or vomiting.   silver sulfADIAZINE 1 % cream Commonly known as:  SILVADENE Apply 1 application topically daily.   SIMBRINZA 1-0.2 % Susp Generic drug:  Brinzolamide-Brimonidine Place 1 drop into both eyes 2 (two) times daily. Take as instructed by Dr. Katy Fitch.   TOUJEO SOLOSTAR 300 UNIT/ML Sopn Generic drug:  Insulin Glargine INJECT 60 UNITS INTO THE SKIN EVERY MORNING AND 90 UNITS INTO THE SKIN AT NIGHT   V-GO 20 Kit Use 1 pimp daily with insulin   VICTOZA 18 MG/3ML Sopn Generic drug:  liraglutide ADMINISTER 1.8 MG UNDER THE SKIN AT THE SAME TIME EVERY DAY   Vitamin D3 2000 units capsule Take 1 capsule (2,000 Units total) by mouth daily.       Allergies:  Allergies  Allergen Reactions  . Vancomycin     REACTION: ARF    Past Medical History:  Diagnosis Date  . Arthritis    "elbows & knees" (12/13/2014)  . Asthma   . CKD (chronic kidney disease), stage III   . DIABETIC FOOT ULCER 06/20/2009  . Edema, macular, due to secondary diabetes ([place])   . Erectile dysfunction   . GERD (gastroesophageal reflux disease)   . HEARING LOSS, SENSORINEURAL, BILATERAL 01/03/2007   Seen by ENT Dr. Orpah Greek D. Redmond Baseman 01/03/07  . Hemorrhoids   . History of echocardiogram    a. 04/2008 Echo: EF 50-55%, abnl LV relaxation, mildly dil LA.  Marland Kitchen Hyperlipidemia   . Hypertension   . Morbid obesity ([place])   . Neuropathy, lower extremity   . OSA (obstructive sleep apnea)    uses CPAP nightly  . OSA on CPAP    Nocturnal polysomnogram on 01/21/2010 showed severe obstructive sleep apnea/hypopnea  syndrome, AHI 74.1 per hour with non positional events, moderately loud snoring, and oxygen desaturation to a nadir of 78% on room air.  CPAP was successfully titrated to 17 CWP, AHI 1.1 per hour using a large ResMed Mirage Quattro full-face mask with heated humidifier. Bruxism was noted.   . Osteomyelitis of ankle and foot ([place])   . Retinopathy   . Type II diabetes mellitus (HCC)    w/complication NOS, type II    Past Surgical History:  Procedure Laterality Date  . AMPUTATION Left 12/15/2014   Procedure: LEFT SECOND TOE AMPUTATION ;  Surgeon: Dorna Leitz, MD;  Location: [place];  Service: Orthopedics;  Laterality: Left;  . 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 Kathalene Frames. Mayer Camel.  . WOUND DEBRIDEMENT Left 06/15/2016   Procedure: DEBRIDEMENT WOUND LEFT FOOT WITH GRAFT APPLICATION;  Surgeon: Landis Martins, DPM;  Location: [place];  Service: Podiatry;  Laterality: Left;    Family History  Problem Relation Age of Onset  . Diabetes Mother        also 2 siblings  . Heart attack Father 23  . Throat cancer Brother     Social History:  reports that he quit smoking about 7 years ago. His smoking use included Cigarettes. He has a 15.00 pack-year smoking history. He has never used smokeless tobacco. He reports that he drinks about 6.6 oz of alcohol per week . He  reports that he does not use drugs.    Review of Systems    The following is a copy of the previous note:  Lipid management: taking Lipitor 20 mg, followed by PCP    Lab Results  Component Value Date   CHOL 110 10/22/2016   HDL 28.50 (L) 10/22/2016   LDLCALC 57 10/22/2016   TRIG 126.0 10/22/2016   CHOLHDL 4 10/22/2016            Hypertension: Has had hypertension for a few years treated with enalapril,  taking 40 mg  Also on Lasix and 25 mg metoprolol This is treated by PCP  Last foot exam done by podiatrist Has been seen  regularly by podiatrist, is getting better healing now   Physical Examination:  BP 130/74   Pulse 79   Ht 6' 5.25" (1.962 m)   Wt (!) 393 lb 3.2 oz (178.4 kg)   SpO2 98%   BMI 46.33 kg/m       ASSESSMENT:  Diabetes type 2, uncontrolled with morbid obesity  See history of present illness for detailed discussion of current diabetes management, blood sugar patterns and problems identified  He is  insulin resistant and taking Up to almost 300 units of insulin prior to starting the V-go pump A1c recently was 9.3  Day-to-day management of the V-go pump discussed in detail including site rotation, issues with local possible reactions, changing the pump consistently at the same time, bolusing 30 minutes before eating and adjusting boluses for types of meals and also for snacks which she is not always doing  HYPERTENSION:  Blood pressure is again controlled    PLAN:     Will need to have him adjust his bolus amounts based on total meal size, carbohydrate and fat content  He will take only 4 units for his light breakfast in the morning and 8 units if eating a full meal in the morning  He needs to continue taking 6-7 clicks for lunch and dinner but adjust up or down 1 or 2 clicks based on pre-meal blood sugar and type of meal  He probably will also need to take 1-2 clicks for any large snacks  He does need to cut back on his portions, higher fat meals and carbohydrates in general  Hopefully can continue to have good fasting blood sugars which she did today Hopefully can start walking soon after his ulcer is healed Discussed blood sugar targets at various times and to call if he is getting hypoglycemia overnight     Counseling time on subjects discussed in assessment and plan sections is over 50% of today's 25 minute visit  Patient Instructions  Check blood sugars on waking up  daily  Also check blood sugars about 2 hours after a meal and do this after different meals by  rotation  Recommended blood sugar levels on waking up is 90-130 and about 2 hours after meal is 130-160  Please bring your blood sugar monitor to each visit, thank you  Adjust clicks based of how much starch or fat you eat  1-2 clicks for snacks    Counseling time on subjects discussed above is over 50% of today's 25 minute visit    Eneida Evers 03/07/2017, 9:29 PM   Note: This office note was prepared with Estate agent. Any transcriptional errors that result from this process are unintentional.   Joandry Slagter 03/07/2017, 9:17 PM   Note: This office note was prepared with Dragon voice recognition system  technology. Any transcriptional errors that result from this process are unintentional.

## 2017-03-05 NOTE — Patient Instructions (Signed)
Check blood sugars on waking up  daily  Also check blood sugars about 2 hours after a meal and do this after different meals by rotation  Recommended blood sugar levels on waking up is 90-130 and about 2 hours after meal is 130-160  Please bring your blood sugar monitor to each visit, thank you  Adjust clicks based of how much starch or fat you eat  1-2 clicks for snacks

## 2017-03-06 DIAGNOSIS — E11621 Type 2 diabetes mellitus with foot ulcer: Secondary | ICD-10-CM | POA: Diagnosis not present

## 2017-03-06 DIAGNOSIS — I129 Hypertensive chronic kidney disease with stage 1 through stage 4 chronic kidney disease, or unspecified chronic kidney disease: Secondary | ICD-10-CM | POA: Diagnosis not present

## 2017-03-06 DIAGNOSIS — L97522 Non-pressure chronic ulcer of other part of left foot with fat layer exposed: Secondary | ICD-10-CM | POA: Diagnosis not present

## 2017-03-06 DIAGNOSIS — N183 Chronic kidney disease, stage 3 (moderate): Secondary | ICD-10-CM | POA: Diagnosis not present

## 2017-03-06 DIAGNOSIS — E1122 Type 2 diabetes mellitus with diabetic chronic kidney disease: Secondary | ICD-10-CM | POA: Diagnosis not present

## 2017-03-06 DIAGNOSIS — E11311 Type 2 diabetes mellitus with unspecified diabetic retinopathy with macular edema: Secondary | ICD-10-CM | POA: Diagnosis not present

## 2017-03-06 DIAGNOSIS — E1142 Type 2 diabetes mellitus with diabetic polyneuropathy: Secondary | ICD-10-CM | POA: Diagnosis not present

## 2017-03-06 DIAGNOSIS — Z4800 Encounter for change or removal of nonsurgical wound dressing: Secondary | ICD-10-CM | POA: Diagnosis not present

## 2017-03-06 DIAGNOSIS — Z89422 Acquired absence of other left toe(s): Secondary | ICD-10-CM | POA: Diagnosis not present

## 2017-03-06 DIAGNOSIS — Z7901 Long term (current) use of anticoagulants: Secondary | ICD-10-CM | POA: Diagnosis not present

## 2017-03-06 DIAGNOSIS — Z794 Long term (current) use of insulin: Secondary | ICD-10-CM | POA: Diagnosis not present

## 2017-03-08 ENCOUNTER — Telehealth: Payer: Self-pay | Admitting: Sports Medicine

## 2017-03-08 DIAGNOSIS — E11311 Type 2 diabetes mellitus with unspecified diabetic retinopathy with macular edema: Secondary | ICD-10-CM | POA: Diagnosis not present

## 2017-03-08 DIAGNOSIS — E11621 Type 2 diabetes mellitus with foot ulcer: Secondary | ICD-10-CM | POA: Diagnosis not present

## 2017-03-08 DIAGNOSIS — L97522 Non-pressure chronic ulcer of other part of left foot with fat layer exposed: Secondary | ICD-10-CM | POA: Diagnosis not present

## 2017-03-08 DIAGNOSIS — Z794 Long term (current) use of insulin: Secondary | ICD-10-CM | POA: Diagnosis not present

## 2017-03-08 DIAGNOSIS — I129 Hypertensive chronic kidney disease with stage 1 through stage 4 chronic kidney disease, or unspecified chronic kidney disease: Secondary | ICD-10-CM | POA: Diagnosis not present

## 2017-03-08 DIAGNOSIS — E1142 Type 2 diabetes mellitus with diabetic polyneuropathy: Secondary | ICD-10-CM | POA: Diagnosis not present

## 2017-03-08 DIAGNOSIS — Z89422 Acquired absence of other left toe(s): Secondary | ICD-10-CM | POA: Diagnosis not present

## 2017-03-08 DIAGNOSIS — N183 Chronic kidney disease, stage 3 (moderate): Secondary | ICD-10-CM | POA: Diagnosis not present

## 2017-03-08 DIAGNOSIS — E1122 Type 2 diabetes mellitus with diabetic chronic kidney disease: Secondary | ICD-10-CM | POA: Diagnosis not present

## 2017-03-08 DIAGNOSIS — Z7901 Long term (current) use of anticoagulants: Secondary | ICD-10-CM | POA: Diagnosis not present

## 2017-03-08 DIAGNOSIS — Z4800 Encounter for change or removal of nonsurgical wound dressing: Secondary | ICD-10-CM | POA: Diagnosis not present

## 2017-03-08 NOTE — Telephone Encounter (Signed)
This is [name] with [facility]. I was calling to request orders to continue seeing him. I did his re-certification for daily for ten days and three times a week for six weeks. Will continue with current wound care orders which are the use of Regranex to wound covered by dry gauze then roll gauze and ace wrap on top of that. Wash with soap and water. If you would please call me back to let me know that these orders are okay. You can reach me at [phone] and if I do not answer, you can leave a message on my secure voicemail.

## 2017-03-08 NOTE — Telephone Encounter (Signed)
Continue regranex

## 2017-03-09 ENCOUNTER — Other Ambulatory Visit: Payer: Self-pay | Admitting: Endocrinology

## 2017-03-09 ENCOUNTER — Other Ambulatory Visit: Payer: Self-pay | Admitting: Internal Medicine

## 2017-03-09 ENCOUNTER — Encounter: Payer: Self-pay | Admitting: Dietician

## 2017-03-09 DIAGNOSIS — E1122 Type 2 diabetes mellitus with diabetic chronic kidney disease: Secondary | ICD-10-CM | POA: Diagnosis not present

## 2017-03-09 DIAGNOSIS — N183 Chronic kidney disease, stage 3 (moderate): Secondary | ICD-10-CM | POA: Diagnosis not present

## 2017-03-09 DIAGNOSIS — Z794 Long term (current) use of insulin: Secondary | ICD-10-CM | POA: Diagnosis not present

## 2017-03-09 DIAGNOSIS — L97522 Non-pressure chronic ulcer of other part of left foot with fat layer exposed: Secondary | ICD-10-CM | POA: Diagnosis not present

## 2017-03-09 DIAGNOSIS — Z4800 Encounter for change or removal of nonsurgical wound dressing: Secondary | ICD-10-CM | POA: Diagnosis not present

## 2017-03-09 DIAGNOSIS — E1142 Type 2 diabetes mellitus with diabetic polyneuropathy: Secondary | ICD-10-CM | POA: Diagnosis not present

## 2017-03-09 DIAGNOSIS — I129 Hypertensive chronic kidney disease with stage 1 through stage 4 chronic kidney disease, or unspecified chronic kidney disease: Secondary | ICD-10-CM | POA: Diagnosis not present

## 2017-03-09 DIAGNOSIS — E11311 Type 2 diabetes mellitus with unspecified diabetic retinopathy with macular edema: Secondary | ICD-10-CM | POA: Diagnosis not present

## 2017-03-09 DIAGNOSIS — E113311 Type 2 diabetes mellitus with moderate nonproliferative diabetic retinopathy with macular edema, right eye: Secondary | ICD-10-CM | POA: Diagnosis not present

## 2017-03-09 DIAGNOSIS — G4733 Obstructive sleep apnea (adult) (pediatric): Secondary | ICD-10-CM | POA: Diagnosis not present

## 2017-03-09 DIAGNOSIS — E11621 Type 2 diabetes mellitus with foot ulcer: Secondary | ICD-10-CM | POA: Diagnosis not present

## 2017-03-09 DIAGNOSIS — Z89422 Acquired absence of other left toe(s): Secondary | ICD-10-CM | POA: Diagnosis not present

## 2017-03-09 DIAGNOSIS — Z7901 Long term (current) use of anticoagulants: Secondary | ICD-10-CM | POA: Diagnosis not present

## 2017-03-09 NOTE — Telephone Encounter (Addendum)
I informed Levada Dy - [facility] that Dr. Cannon Kettle states continue the Regranex daily. Levada Dy states if needs to continue daily dressing changes more than 10 days will need new orders.

## 2017-03-10 DIAGNOSIS — I129 Hypertensive chronic kidney disease with stage 1 through stage 4 chronic kidney disease, or unspecified chronic kidney disease: Secondary | ICD-10-CM | POA: Diagnosis not present

## 2017-03-10 DIAGNOSIS — E11621 Type 2 diabetes mellitus with foot ulcer: Secondary | ICD-10-CM | POA: Diagnosis not present

## 2017-03-10 DIAGNOSIS — Z7901 Long term (current) use of anticoagulants: Secondary | ICD-10-CM | POA: Diagnosis not present

## 2017-03-10 DIAGNOSIS — Z89422 Acquired absence of other left toe(s): Secondary | ICD-10-CM | POA: Diagnosis not present

## 2017-03-10 DIAGNOSIS — L97522 Non-pressure chronic ulcer of other part of left foot with fat layer exposed: Secondary | ICD-10-CM | POA: Diagnosis not present

## 2017-03-10 DIAGNOSIS — E11311 Type 2 diabetes mellitus with unspecified diabetic retinopathy with macular edema: Secondary | ICD-10-CM | POA: Diagnosis not present

## 2017-03-10 DIAGNOSIS — E1122 Type 2 diabetes mellitus with diabetic chronic kidney disease: Secondary | ICD-10-CM | POA: Diagnosis not present

## 2017-03-10 DIAGNOSIS — E1142 Type 2 diabetes mellitus with diabetic polyneuropathy: Secondary | ICD-10-CM | POA: Diagnosis not present

## 2017-03-10 DIAGNOSIS — Z794 Long term (current) use of insulin: Secondary | ICD-10-CM | POA: Diagnosis not present

## 2017-03-10 DIAGNOSIS — N183 Chronic kidney disease, stage 3 (moderate): Secondary | ICD-10-CM | POA: Diagnosis not present

## 2017-03-10 DIAGNOSIS — Z4800 Encounter for change or removal of nonsurgical wound dressing: Secondary | ICD-10-CM | POA: Diagnosis not present

## 2017-03-11 DIAGNOSIS — Z4800 Encounter for change or removal of nonsurgical wound dressing: Secondary | ICD-10-CM | POA: Diagnosis not present

## 2017-03-11 DIAGNOSIS — L97522 Non-pressure chronic ulcer of other part of left foot with fat layer exposed: Secondary | ICD-10-CM | POA: Diagnosis not present

## 2017-03-11 DIAGNOSIS — I129 Hypertensive chronic kidney disease with stage 1 through stage 4 chronic kidney disease, or unspecified chronic kidney disease: Secondary | ICD-10-CM | POA: Diagnosis not present

## 2017-03-11 DIAGNOSIS — E11621 Type 2 diabetes mellitus with foot ulcer: Secondary | ICD-10-CM | POA: Diagnosis not present

## 2017-03-11 DIAGNOSIS — E11311 Type 2 diabetes mellitus with unspecified diabetic retinopathy with macular edema: Secondary | ICD-10-CM | POA: Diagnosis not present

## 2017-03-11 DIAGNOSIS — N183 Chronic kidney disease, stage 3 (moderate): Secondary | ICD-10-CM | POA: Diagnosis not present

## 2017-03-11 DIAGNOSIS — Z89422 Acquired absence of other left toe(s): Secondary | ICD-10-CM | POA: Diagnosis not present

## 2017-03-11 DIAGNOSIS — Z794 Long term (current) use of insulin: Secondary | ICD-10-CM | POA: Diagnosis not present

## 2017-03-11 DIAGNOSIS — Z7901 Long term (current) use of anticoagulants: Secondary | ICD-10-CM | POA: Diagnosis not present

## 2017-03-11 DIAGNOSIS — E1142 Type 2 diabetes mellitus with diabetic polyneuropathy: Secondary | ICD-10-CM | POA: Diagnosis not present

## 2017-03-11 DIAGNOSIS — E1122 Type 2 diabetes mellitus with diabetic chronic kidney disease: Secondary | ICD-10-CM | POA: Diagnosis not present

## 2017-03-12 DIAGNOSIS — Z89422 Acquired absence of other left toe(s): Secondary | ICD-10-CM | POA: Diagnosis not present

## 2017-03-12 DIAGNOSIS — L97522 Non-pressure chronic ulcer of other part of left foot with fat layer exposed: Secondary | ICD-10-CM | POA: Diagnosis not present

## 2017-03-12 DIAGNOSIS — Z4800 Encounter for change or removal of nonsurgical wound dressing: Secondary | ICD-10-CM | POA: Diagnosis not present

## 2017-03-12 DIAGNOSIS — E11621 Type 2 diabetes mellitus with foot ulcer: Secondary | ICD-10-CM | POA: Diagnosis not present

## 2017-03-12 DIAGNOSIS — Z794 Long term (current) use of insulin: Secondary | ICD-10-CM | POA: Diagnosis not present

## 2017-03-12 DIAGNOSIS — E11311 Type 2 diabetes mellitus with unspecified diabetic retinopathy with macular edema: Secondary | ICD-10-CM | POA: Diagnosis not present

## 2017-03-12 DIAGNOSIS — N183 Chronic kidney disease, stage 3 (moderate): Secondary | ICD-10-CM | POA: Diagnosis not present

## 2017-03-12 DIAGNOSIS — I129 Hypertensive chronic kidney disease with stage 1 through stage 4 chronic kidney disease, or unspecified chronic kidney disease: Secondary | ICD-10-CM | POA: Diagnosis not present

## 2017-03-12 DIAGNOSIS — E1142 Type 2 diabetes mellitus with diabetic polyneuropathy: Secondary | ICD-10-CM | POA: Diagnosis not present

## 2017-03-12 DIAGNOSIS — E1122 Type 2 diabetes mellitus with diabetic chronic kidney disease: Secondary | ICD-10-CM | POA: Diagnosis not present

## 2017-03-12 DIAGNOSIS — Z7901 Long term (current) use of anticoagulants: Secondary | ICD-10-CM | POA: Diagnosis not present

## 2017-03-13 DIAGNOSIS — N183 Chronic kidney disease, stage 3 (moderate): Secondary | ICD-10-CM | POA: Diagnosis not present

## 2017-03-13 DIAGNOSIS — E11621 Type 2 diabetes mellitus with foot ulcer: Secondary | ICD-10-CM | POA: Diagnosis not present

## 2017-03-13 DIAGNOSIS — L97522 Non-pressure chronic ulcer of other part of left foot with fat layer exposed: Secondary | ICD-10-CM | POA: Diagnosis not present

## 2017-03-13 DIAGNOSIS — Z7901 Long term (current) use of anticoagulants: Secondary | ICD-10-CM | POA: Diagnosis not present

## 2017-03-13 DIAGNOSIS — I129 Hypertensive chronic kidney disease with stage 1 through stage 4 chronic kidney disease, or unspecified chronic kidney disease: Secondary | ICD-10-CM | POA: Diagnosis not present

## 2017-03-13 DIAGNOSIS — E1142 Type 2 diabetes mellitus with diabetic polyneuropathy: Secondary | ICD-10-CM | POA: Diagnosis not present

## 2017-03-13 DIAGNOSIS — E11311 Type 2 diabetes mellitus with unspecified diabetic retinopathy with macular edema: Secondary | ICD-10-CM | POA: Diagnosis not present

## 2017-03-13 DIAGNOSIS — Z89422 Acquired absence of other left toe(s): Secondary | ICD-10-CM | POA: Diagnosis not present

## 2017-03-13 DIAGNOSIS — E1122 Type 2 diabetes mellitus with diabetic chronic kidney disease: Secondary | ICD-10-CM | POA: Diagnosis not present

## 2017-03-13 DIAGNOSIS — Z4800 Encounter for change or removal of nonsurgical wound dressing: Secondary | ICD-10-CM | POA: Diagnosis not present

## 2017-03-13 DIAGNOSIS — Z794 Long term (current) use of insulin: Secondary | ICD-10-CM | POA: Diagnosis not present

## 2017-03-14 DIAGNOSIS — E11621 Type 2 diabetes mellitus with foot ulcer: Secondary | ICD-10-CM | POA: Diagnosis not present

## 2017-03-14 DIAGNOSIS — I129 Hypertensive chronic kidney disease with stage 1 through stage 4 chronic kidney disease, or unspecified chronic kidney disease: Secondary | ICD-10-CM | POA: Diagnosis not present

## 2017-03-14 DIAGNOSIS — E1142 Type 2 diabetes mellitus with diabetic polyneuropathy: Secondary | ICD-10-CM | POA: Diagnosis not present

## 2017-03-14 DIAGNOSIS — N183 Chronic kidney disease, stage 3 (moderate): Secondary | ICD-10-CM | POA: Diagnosis not present

## 2017-03-14 DIAGNOSIS — L97522 Non-pressure chronic ulcer of other part of left foot with fat layer exposed: Secondary | ICD-10-CM | POA: Diagnosis not present

## 2017-03-14 DIAGNOSIS — Z4800 Encounter for change or removal of nonsurgical wound dressing: Secondary | ICD-10-CM | POA: Diagnosis not present

## 2017-03-14 DIAGNOSIS — Z7901 Long term (current) use of anticoagulants: Secondary | ICD-10-CM | POA: Diagnosis not present

## 2017-03-14 DIAGNOSIS — E11311 Type 2 diabetes mellitus with unspecified diabetic retinopathy with macular edema: Secondary | ICD-10-CM | POA: Diagnosis not present

## 2017-03-14 DIAGNOSIS — Z89422 Acquired absence of other left toe(s): Secondary | ICD-10-CM | POA: Diagnosis not present

## 2017-03-14 DIAGNOSIS — E1122 Type 2 diabetes mellitus with diabetic chronic kidney disease: Secondary | ICD-10-CM | POA: Diagnosis not present

## 2017-03-14 DIAGNOSIS — Z794 Long term (current) use of insulin: Secondary | ICD-10-CM | POA: Diagnosis not present

## 2017-03-15 DIAGNOSIS — Z4800 Encounter for change or removal of nonsurgical wound dressing: Secondary | ICD-10-CM | POA: Diagnosis not present

## 2017-03-15 DIAGNOSIS — E1142 Type 2 diabetes mellitus with diabetic polyneuropathy: Secondary | ICD-10-CM | POA: Diagnosis not present

## 2017-03-15 DIAGNOSIS — E11621 Type 2 diabetes mellitus with foot ulcer: Secondary | ICD-10-CM | POA: Diagnosis not present

## 2017-03-15 DIAGNOSIS — Z89422 Acquired absence of other left toe(s): Secondary | ICD-10-CM | POA: Diagnosis not present

## 2017-03-15 DIAGNOSIS — I129 Hypertensive chronic kidney disease with stage 1 through stage 4 chronic kidney disease, or unspecified chronic kidney disease: Secondary | ICD-10-CM | POA: Diagnosis not present

## 2017-03-15 DIAGNOSIS — L97522 Non-pressure chronic ulcer of other part of left foot with fat layer exposed: Secondary | ICD-10-CM | POA: Diagnosis not present

## 2017-03-15 DIAGNOSIS — E11311 Type 2 diabetes mellitus with unspecified diabetic retinopathy with macular edema: Secondary | ICD-10-CM | POA: Diagnosis not present

## 2017-03-15 DIAGNOSIS — N183 Chronic kidney disease, stage 3 (moderate): Secondary | ICD-10-CM | POA: Diagnosis not present

## 2017-03-15 DIAGNOSIS — Z7901 Long term (current) use of anticoagulants: Secondary | ICD-10-CM | POA: Diagnosis not present

## 2017-03-15 DIAGNOSIS — E1122 Type 2 diabetes mellitus with diabetic chronic kidney disease: Secondary | ICD-10-CM | POA: Diagnosis not present

## 2017-03-15 DIAGNOSIS — Z794 Long term (current) use of insulin: Secondary | ICD-10-CM | POA: Diagnosis not present

## 2017-03-16 ENCOUNTER — Ambulatory Visit (INDEPENDENT_AMBULATORY_CARE_PROVIDER_SITE_OTHER): Payer: Medicare Other | Admitting: Sports Medicine

## 2017-03-16 ENCOUNTER — Encounter: Payer: Self-pay | Admitting: Sports Medicine

## 2017-03-16 VITALS — BP 168/90 | HR 82 | Resp 16

## 2017-03-16 DIAGNOSIS — L97522 Non-pressure chronic ulcer of other part of left foot with fat layer exposed: Secondary | ICD-10-CM | POA: Diagnosis not present

## 2017-03-16 DIAGNOSIS — Z89432 Acquired absence of left foot: Secondary | ICD-10-CM

## 2017-03-16 DIAGNOSIS — IMO0002 Reserved for concepts with insufficient information to code with codable children: Secondary | ICD-10-CM

## 2017-03-16 DIAGNOSIS — E0842 Diabetes mellitus due to underlying condition with diabetic polyneuropathy: Secondary | ICD-10-CM

## 2017-03-16 DIAGNOSIS — I739 Peripheral vascular disease, unspecified: Secondary | ICD-10-CM

## 2017-03-16 NOTE — Progress Notes (Signed)
Subjective: [name] is a 66 y.o. male patient seen today in office for POV #28 (DOS 06-15-16), S/P Left wound debridement and placement of stravix allograft. Patient is using regranex with improvement, denies pain at surgical site, states that when he does have pain sometimes 3/10; denies calf pain, denies headache, chest pain, shortness of breath, nausea, vomiting, fever, or chills. Patient states that he is doing well with no issues. Has home nursing that needs to be recertified; reports no issues with dressings. No other issues noted.   Patient Active Problem List   Diagnosis Date Noted  . Chronic pain syndrome 09/18/2015  . Preventative health care 03/12/2015  . Acute osteomyelitis of toe of left foot ([place])   . Foot ulcer, left ([place]) 12/13/2014  . Paroxysmal atrial fibrillation ([place]) 12/13/2014  . Elevated alkaline phosphatase level 06/11/2014  . Constipation 05/31/2014  . Vitamin D deficiency 11/17/2013  . Anemia 02/10/2012  . Hypertension 11/18/2011  . Obstructive sleep apnea 12/12/2009  . GERD 06/27/2008  . Diabetes type 2, uncontrolled ([place]) 12/28/2007  . DIABETIC MACULAR EDEMA 10/07/2007  . Dyslipidemia 08/15/2007  . BACKGROUND DIABETIC RETINOPATHY 08/15/2007  . HEARING LOSS, SENSORINEURAL, BILATERAL 01/03/2007  . Diabetic peripheral neuropathy associated with type 2 diabetes mellitus ([place]) 12/16/2006  . Chronic kidney disease, stage III (moderate) 12/01/2006  . Morbid obesity ([place]) 08/23/2006  . STATUS, OTHER TOE(S) AMPUTATION 05/21/2006    Current Outpatient Prescriptions on File Prior to Visit  Medication Sig Dispense Refill  . ACCU-CHEK FASTCLIX LANCETS MISC Check blood sugar 5 times a day 408 each 3  . atorvastatin (LIPITOR) 20 MG tablet TAKE 1 TABLET(20 MG) BY MOUTH DAILY 90 tablet 0  . becaplermin (REGRANEX) 0.01 % gel Apply 1 application topically daily. 15 g 5  . Blood Glucose Monitoring Suppl (ACCU-CHEK AVIVA PLUS) w/Device KIT CHECK BLOOD SUGAR 5  TIMES A DAY 1 kit 0  . Cholecalciferol (VITAMIN D3) 2000 UNITS capsule Take 1 capsule (2,000 Units total) by mouth daily. 90 capsule 0  . diclofenac sodium (VOLTAREN) 1 % GEL Apply 4 g topically 4 (four) times daily. 100 g 2  . docusate sodium (COLACE) 100 MG capsule Take 1 capsule (100 mg total) by mouth daily as needed for mild constipation. 14 capsule 0  . ELIQUIS 5 MG TABS tablet TAKE 1 TABLET(5 MG) BY MOUTH TWICE DAILY 180 tablet 3  . enalapril (VASOTEC) 20 MG tablet TAKE 2 TABLETS(40 MG) BY MOUTH DAILY 180 tablet 1  . furosemide (LASIX) 40 MG tablet TAKE 1 TABLET(40 MG) BY MOUTH TWICE DAILY 180 tablet 0  . gabapentin (NEURONTIN) 300 MG capsule TAKE 1 CAPSULE(300 MG) BY MOUTH AT BEDTIME 90 capsule 3  . glucose blood (ACCU-CHEK AVIVA PLUS) test strip USE TO CHECK BLOOD Five TIMES DAILY 450 each 3  . Insulin Disposable Pump (V-GO 20) KIT Use 1 pimp daily with insulin 30 kit 4  . Insulin Pen Needle 31G X 5 MM MISC Use for insulin injection 5 times a day. 150 each 5  . insulin regular human CONCENTRATED (HUMULIN R U-500 KWIKPEN) 500 UNIT/ML kwikpen INJECT 35 UNITS AT BREAKFAST, 60 UNITS AT LUNCH, AND 70 UNITS AT DINNER (Patient taking differently: INJECT 35 UNITS AT BREAKFAST, 60 UNITS AT LUNCH, AND 70 UNITS AT DINNER; pt stated, "Have a pump and uses syringes") 20 mL 2  . Insulin Syringe-Needle U-100 25G X 1" 1 ML MISC Use 1 syringe daily to fill V-Go pump 30 each 3  . metoprolol succinate (TOPROL-XL) 25  MG 24 hr tablet TAKE 1 TABLET(25 MG) BY MOUTH DAILY 90 tablet 2  . omeprazole (PRILOSEC) 20 MG capsule TAKE 1 CAPSULE(20 MG) BY MOUTH DAILY 90 capsule 1  . PROAIR HFA 108 (90 Base) MCG/ACT inhaler INHALE 2 PUFFS INTO THE LUNGS BY MOUTH EVERY 6 HOURS AS NEEDED FOR WHEEZING OR SHORTNESS OF BREATH 8.5 g 6  . promethazine (PHENERGAN) 25 MG tablet TAKE 1 TABLET(25 MG) BY MOUTH EVERY 8 HOURS AS NEEDED FOR NAUSEA OR VOMITING 30 tablet 0  . silver sulfADIAZINE (SILVADENE) 1 % cream Apply 1 application  topically daily. 50 g 1  . SIMBRINZA 1-0.2 % SUSP Place 1 drop into both eyes 2 (two) times daily. Take as instructed by Dr. Katy Fitch.    . TOUJEO SOLOSTAR 300 UNIT/ML SOPN INJECT 60 UNITS INTO THE SKIN EVERY MORNING AND 90 UNITS INTO THE SKIN AT NIGHT 9 pen 0  . VICTOZA 18 MG/3ML SOPN ADMINISTER 1.8 MG UNDER THE SKIN AT THE SAME TIME EVERY DAY 9 mL 0  . HYDROcodone-acetaminophen (NORCO) 7.5-325 MG tablet Take 1 tablet by mouth every 6 (six) hours as needed for moderate pain. 130 tablet 0   No current facility-administered medications on file prior to visit.     Allergies  Allergen Reactions  . Vancomycin     REACTION: ARF    Objective: There were no vitals filed for this visit.  General: No acute distress, AAOx3  Left foot: Plantar forefoot, wound limited to skin, wound bed measuring 1.0x0.5x0.2cm (smaller than previous) with mild periwound keratosis, no swelling to left foot, no erythema, no warmth, no active drainage however on inner guaze there is drainage bloody drainage with no acute signs of infection noted, Capillary fill time <3 seconds in all digits remaining, amputation status of 2nd and 3rd toes. No pain with calf compression.   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[place])    -  Primary   Diabetes mellitus due to underlying condition with diabetic polyneuropathy, unspecified whether long term insulin use ([place])       Foot amputation status, left (HCC)       PVD (peripheral vascular disease) ([place])           -Patient seen and evaluated -Cleansed wound debirded wound margins excisionlly to healthy bleeding margins through the level of dermis and applied antibiotic cream and offloading padding without complication, post debridement measurements as above -Advised patient to make sure to keep dressings clean, dry, and intact allowing nursing to change using regranex and offloading  padding; Advised patient to call office if refill is needed on Regranex. May decrease frequency of dressing changes to 3x per week -Advised patient to continue with forefoot offloading post-op shoe on left foot with use of cane -Advised patient to limit activity to necessity  -Advised patient to rest and elevate as necessary -Patient to return for wound check in 2 weeks. In the meantime, patient to call office if any issues or problems arise.   Landis Martins, DPM

## 2017-03-17 ENCOUNTER — Telehealth: Payer: Self-pay | Admitting: *Deleted

## 2017-03-17 DIAGNOSIS — L97522 Non-pressure chronic ulcer of other part of left foot with fat layer exposed: Secondary | ICD-10-CM | POA: Diagnosis not present

## 2017-03-17 DIAGNOSIS — Z4800 Encounter for change or removal of nonsurgical wound dressing: Secondary | ICD-10-CM | POA: Diagnosis not present

## 2017-03-17 DIAGNOSIS — E1142 Type 2 diabetes mellitus with diabetic polyneuropathy: Secondary | ICD-10-CM | POA: Diagnosis not present

## 2017-03-17 DIAGNOSIS — G4733 Obstructive sleep apnea (adult) (pediatric): Secondary | ICD-10-CM | POA: Diagnosis not present

## 2017-03-17 DIAGNOSIS — E11311 Type 2 diabetes mellitus with unspecified diabetic retinopathy with macular edema: Secondary | ICD-10-CM | POA: Diagnosis not present

## 2017-03-17 DIAGNOSIS — I129 Hypertensive chronic kidney disease with stage 1 through stage 4 chronic kidney disease, or unspecified chronic kidney disease: Secondary | ICD-10-CM | POA: Diagnosis not present

## 2017-03-17 DIAGNOSIS — Z89422 Acquired absence of other left toe(s): Secondary | ICD-10-CM | POA: Diagnosis not present

## 2017-03-17 DIAGNOSIS — Z794 Long term (current) use of insulin: Secondary | ICD-10-CM | POA: Diagnosis not present

## 2017-03-17 DIAGNOSIS — Z7901 Long term (current) use of anticoagulants: Secondary | ICD-10-CM | POA: Diagnosis not present

## 2017-03-17 DIAGNOSIS — E1122 Type 2 diabetes mellitus with diabetic chronic kidney disease: Secondary | ICD-10-CM | POA: Diagnosis not present

## 2017-03-17 DIAGNOSIS — E11621 Type 2 diabetes mellitus with foot ulcer: Secondary | ICD-10-CM | POA: Diagnosis not present

## 2017-03-17 DIAGNOSIS — N183 Chronic kidney disease, stage 3 (moderate): Secondary | ICD-10-CM | POA: Diagnosis not present

## 2017-03-17 NOTE — Telephone Encounter (Addendum)
-----   Message from [place], [place] sent at 03/16/2017  1:37 PM EDT ----- Regarding: Wound care orders Cleanse ulceration with saline or wound wash, apply Regranex and replace offloading padding surrounding the ulceration on left foot 3x per week Thanks Dr. Cannon Kettle.03/17/2017-Faxed orders of 03/16/2017 1:37pm to [facility].

## 2017-03-18 ENCOUNTER — Other Ambulatory Visit: Payer: Self-pay

## 2017-03-18 DIAGNOSIS — H04123 Dry eye syndrome of bilateral lacrimal glands: Secondary | ICD-10-CM | POA: Diagnosis not present

## 2017-03-18 MED ORDER — "INSULIN SYRINGE-NEEDLE U-100 25G X 1"" 1 ML MISC": 3 refills | Status: DC

## 2017-03-19 DIAGNOSIS — Z4800 Encounter for change or removal of nonsurgical wound dressing: Secondary | ICD-10-CM | POA: Diagnosis not present

## 2017-03-19 DIAGNOSIS — L97522 Non-pressure chronic ulcer of other part of left foot with fat layer exposed: Secondary | ICD-10-CM | POA: Diagnosis not present

## 2017-03-19 DIAGNOSIS — E1122 Type 2 diabetes mellitus with diabetic chronic kidney disease: Secondary | ICD-10-CM | POA: Diagnosis not present

## 2017-03-19 DIAGNOSIS — E11621 Type 2 diabetes mellitus with foot ulcer: Secondary | ICD-10-CM | POA: Diagnosis not present

## 2017-03-19 DIAGNOSIS — E11311 Type 2 diabetes mellitus with unspecified diabetic retinopathy with macular edema: Secondary | ICD-10-CM | POA: Diagnosis not present

## 2017-03-19 DIAGNOSIS — E1142 Type 2 diabetes mellitus with diabetic polyneuropathy: Secondary | ICD-10-CM | POA: Diagnosis not present

## 2017-03-19 DIAGNOSIS — Z7901 Long term (current) use of anticoagulants: Secondary | ICD-10-CM | POA: Diagnosis not present

## 2017-03-19 DIAGNOSIS — N183 Chronic kidney disease, stage 3 (moderate): Secondary | ICD-10-CM | POA: Diagnosis not present

## 2017-03-19 DIAGNOSIS — Z794 Long term (current) use of insulin: Secondary | ICD-10-CM | POA: Diagnosis not present

## 2017-03-19 DIAGNOSIS — Z89422 Acquired absence of other left toe(s): Secondary | ICD-10-CM | POA: Diagnosis not present

## 2017-03-19 DIAGNOSIS — I129 Hypertensive chronic kidney disease with stage 1 through stage 4 chronic kidney disease, or unspecified chronic kidney disease: Secondary | ICD-10-CM | POA: Diagnosis not present

## 2017-03-22 ENCOUNTER — Telehealth: Payer: Self-pay | Admitting: Dietician

## 2017-03-22 DIAGNOSIS — Z794 Long term (current) use of insulin: Principal | ICD-10-CM

## 2017-03-22 DIAGNOSIS — E1122 Type 2 diabetes mellitus with diabetic chronic kidney disease: Secondary | ICD-10-CM

## 2017-03-22 DIAGNOSIS — L97522 Non-pressure chronic ulcer of other part of left foot with fat layer exposed: Secondary | ICD-10-CM | POA: Diagnosis not present

## 2017-03-22 DIAGNOSIS — N183 Chronic kidney disease, stage 3 (moderate): Secondary | ICD-10-CM | POA: Diagnosis not present

## 2017-03-22 DIAGNOSIS — I129 Hypertensive chronic kidney disease with stage 1 through stage 4 chronic kidney disease, or unspecified chronic kidney disease: Secondary | ICD-10-CM | POA: Diagnosis not present

## 2017-03-22 DIAGNOSIS — E1142 Type 2 diabetes mellitus with diabetic polyneuropathy: Secondary | ICD-10-CM | POA: Diagnosis not present

## 2017-03-22 DIAGNOSIS — Z4800 Encounter for change or removal of nonsurgical wound dressing: Secondary | ICD-10-CM | POA: Diagnosis not present

## 2017-03-22 DIAGNOSIS — Z7901 Long term (current) use of anticoagulants: Secondary | ICD-10-CM | POA: Diagnosis not present

## 2017-03-22 DIAGNOSIS — IMO0002 Reserved for concepts with insufficient information to code with codable children: Secondary | ICD-10-CM

## 2017-03-22 DIAGNOSIS — E11621 Type 2 diabetes mellitus with foot ulcer: Secondary | ICD-10-CM | POA: Diagnosis not present

## 2017-03-22 DIAGNOSIS — Z89422 Acquired absence of other left toe(s): Secondary | ICD-10-CM | POA: Diagnosis not present

## 2017-03-22 DIAGNOSIS — E11311 Type 2 diabetes mellitus with unspecified diabetic retinopathy with macular edema: Secondary | ICD-10-CM | POA: Diagnosis not present

## 2017-03-22 DIAGNOSIS — E1165 Type 2 diabetes mellitus with hyperglycemia: Principal | ICD-10-CM

## 2017-03-22 DIAGNOSIS — G4733 Obstructive sleep apnea (adult) (pediatric): Secondary | ICD-10-CM | POA: Diagnosis not present

## 2017-03-22 NOTE — Telephone Encounter (Signed)
[facility] is not in network with [place] [employer]. Gave [name] 3 other medical supply companies' names and phone numbers that should be in network with his insurance.

## 2017-03-24 DIAGNOSIS — I129 Hypertensive chronic kidney disease with stage 1 through stage 4 chronic kidney disease, or unspecified chronic kidney disease: Secondary | ICD-10-CM | POA: Diagnosis not present

## 2017-03-24 DIAGNOSIS — Z7901 Long term (current) use of anticoagulants: Secondary | ICD-10-CM | POA: Diagnosis not present

## 2017-03-24 DIAGNOSIS — N183 Chronic kidney disease, stage 3 (moderate): Secondary | ICD-10-CM | POA: Diagnosis not present

## 2017-03-24 DIAGNOSIS — E11311 Type 2 diabetes mellitus with unspecified diabetic retinopathy with macular edema: Secondary | ICD-10-CM | POA: Diagnosis not present

## 2017-03-24 DIAGNOSIS — E1122 Type 2 diabetes mellitus with diabetic chronic kidney disease: Secondary | ICD-10-CM | POA: Diagnosis not present

## 2017-03-24 DIAGNOSIS — E11621 Type 2 diabetes mellitus with foot ulcer: Secondary | ICD-10-CM | POA: Diagnosis not present

## 2017-03-24 DIAGNOSIS — Z794 Long term (current) use of insulin: Secondary | ICD-10-CM | POA: Diagnosis not present

## 2017-03-24 DIAGNOSIS — L97522 Non-pressure chronic ulcer of other part of left foot with fat layer exposed: Secondary | ICD-10-CM | POA: Diagnosis not present

## 2017-03-24 DIAGNOSIS — Z4800 Encounter for change or removal of nonsurgical wound dressing: Secondary | ICD-10-CM | POA: Diagnosis not present

## 2017-03-24 DIAGNOSIS — E1142 Type 2 diabetes mellitus with diabetic polyneuropathy: Secondary | ICD-10-CM | POA: Diagnosis not present

## 2017-03-24 DIAGNOSIS — Z89422 Acquired absence of other left toe(s): Secondary | ICD-10-CM | POA: Diagnosis not present

## 2017-03-26 DIAGNOSIS — Z794 Long term (current) use of insulin: Secondary | ICD-10-CM | POA: Diagnosis not present

## 2017-03-26 DIAGNOSIS — E1142 Type 2 diabetes mellitus with diabetic polyneuropathy: Secondary | ICD-10-CM | POA: Diagnosis not present

## 2017-03-26 DIAGNOSIS — Z7901 Long term (current) use of anticoagulants: Secondary | ICD-10-CM | POA: Diagnosis not present

## 2017-03-26 DIAGNOSIS — I129 Hypertensive chronic kidney disease with stage 1 through stage 4 chronic kidney disease, or unspecified chronic kidney disease: Secondary | ICD-10-CM | POA: Diagnosis not present

## 2017-03-26 DIAGNOSIS — Z4800 Encounter for change or removal of nonsurgical wound dressing: Secondary | ICD-10-CM | POA: Diagnosis not present

## 2017-03-26 DIAGNOSIS — L97522 Non-pressure chronic ulcer of other part of left foot with fat layer exposed: Secondary | ICD-10-CM | POA: Diagnosis not present

## 2017-03-26 DIAGNOSIS — Z89422 Acquired absence of other left toe(s): Secondary | ICD-10-CM | POA: Diagnosis not present

## 2017-03-26 DIAGNOSIS — E11621 Type 2 diabetes mellitus with foot ulcer: Secondary | ICD-10-CM | POA: Diagnosis not present

## 2017-03-26 DIAGNOSIS — N183 Chronic kidney disease, stage 3 (moderate): Secondary | ICD-10-CM | POA: Diagnosis not present

## 2017-03-26 DIAGNOSIS — E11311 Type 2 diabetes mellitus with unspecified diabetic retinopathy with macular edema: Secondary | ICD-10-CM | POA: Diagnosis not present

## 2017-03-26 DIAGNOSIS — E1122 Type 2 diabetes mellitus with diabetic chronic kidney disease: Secondary | ICD-10-CM | POA: Diagnosis not present

## 2017-03-29 ENCOUNTER — Other Ambulatory Visit: Payer: Self-pay | Admitting: *Deleted

## 2017-03-29 DIAGNOSIS — E11311 Type 2 diabetes mellitus with unspecified diabetic retinopathy with macular edema: Secondary | ICD-10-CM | POA: Diagnosis not present

## 2017-03-29 DIAGNOSIS — N183 Chronic kidney disease, stage 3 (moderate): Secondary | ICD-10-CM | POA: Diagnosis not present

## 2017-03-29 DIAGNOSIS — L97522 Non-pressure chronic ulcer of other part of left foot with fat layer exposed: Secondary | ICD-10-CM | POA: Diagnosis not present

## 2017-03-29 DIAGNOSIS — E11621 Type 2 diabetes mellitus with foot ulcer: Secondary | ICD-10-CM | POA: Diagnosis not present

## 2017-03-29 DIAGNOSIS — Z4800 Encounter for change or removal of nonsurgical wound dressing: Secondary | ICD-10-CM | POA: Diagnosis not present

## 2017-03-29 DIAGNOSIS — Z89422 Acquired absence of other left toe(s): Secondary | ICD-10-CM | POA: Diagnosis not present

## 2017-03-29 DIAGNOSIS — I129 Hypertensive chronic kidney disease with stage 1 through stage 4 chronic kidney disease, or unspecified chronic kidney disease: Secondary | ICD-10-CM | POA: Diagnosis not present

## 2017-03-29 DIAGNOSIS — E1122 Type 2 diabetes mellitus with diabetic chronic kidney disease: Secondary | ICD-10-CM | POA: Diagnosis not present

## 2017-03-29 DIAGNOSIS — Z7901 Long term (current) use of anticoagulants: Secondary | ICD-10-CM | POA: Diagnosis not present

## 2017-03-29 DIAGNOSIS — Z794 Long term (current) use of insulin: Secondary | ICD-10-CM | POA: Diagnosis not present

## 2017-03-29 DIAGNOSIS — E1142 Type 2 diabetes mellitus with diabetic polyneuropathy: Secondary | ICD-10-CM | POA: Diagnosis not present

## 2017-03-30 ENCOUNTER — Encounter: Payer: Self-pay | Admitting: Sports Medicine

## 2017-03-30 ENCOUNTER — Other Ambulatory Visit: Payer: Self-pay | Admitting: *Deleted

## 2017-03-30 ENCOUNTER — Ambulatory Visit (INDEPENDENT_AMBULATORY_CARE_PROVIDER_SITE_OTHER): Payer: Medicare Other | Admitting: Sports Medicine

## 2017-03-30 VITALS — BP 152/82 | HR 70 | Resp 16

## 2017-03-30 DIAGNOSIS — I739 Peripheral vascular disease, unspecified: Secondary | ICD-10-CM

## 2017-03-30 DIAGNOSIS — L97522 Non-pressure chronic ulcer of other part of left foot with fat layer exposed: Secondary | ICD-10-CM

## 2017-03-30 DIAGNOSIS — Z89432 Acquired absence of left foot: Secondary | ICD-10-CM | POA: Diagnosis not present

## 2017-03-30 DIAGNOSIS — IMO0002 Reserved for concepts with insufficient information to code with codable children: Secondary | ICD-10-CM

## 2017-03-30 DIAGNOSIS — L97521 Non-pressure chronic ulcer of other part of left foot limited to breakdown of skin: Secondary | ICD-10-CM | POA: Diagnosis not present

## 2017-03-30 DIAGNOSIS — Z794 Long term (current) use of insulin: Secondary | ICD-10-CM | POA: Diagnosis not present

## 2017-03-30 DIAGNOSIS — E119 Type 2 diabetes mellitus without complications: Secondary | ICD-10-CM | POA: Diagnosis not present

## 2017-03-30 DIAGNOSIS — E11621 Type 2 diabetes mellitus with foot ulcer: Secondary | ICD-10-CM | POA: Diagnosis not present

## 2017-03-30 DIAGNOSIS — E0842 Diabetes mellitus due to underlying condition with diabetic polyneuropathy: Secondary | ICD-10-CM | POA: Diagnosis not present

## 2017-03-30 MED ORDER — VICTOZA 18 MG/3ML ~~LOC~~ SOPN: 1.8000 mg | PEN_INJECTOR | Freq: Every day | SUBCUTANEOUS | 1 refills | Status: DC

## 2017-03-30 MED ORDER — FUROSEMIDE 40 MG PO TABS: 40.0000 mg | ORAL_TABLET | Freq: Two times a day (BID) | ORAL | 1 refills | Status: DC

## 2017-03-30 MED ORDER — PROMETHAZINE HCL 25 MG PO TABS: 25.0000 mg | ORAL_TABLET | Freq: Three times a day (TID) | ORAL | 0 refills | Status: DC | PRN

## 2017-03-30 NOTE — Telephone Encounter (Signed)
Called to follow up and offer assistance t patient for obtaining the [employer] personal. He says that [facility]- told him they could not work with his insurance. He intends to call the other two companies and will let us know if he needs our assistance.

## 2017-03-30 NOTE — Telephone Encounter (Signed)
rec'd a call from [facility] [facility] for meter and supplies, it is noted new script was sent to [facility] in 7/18, declined request, called pt, he states he got a new meter in July and was actually trying to get the new [place], so the rep stated they would just get him a new meter, he states he doesnot want to have to pay for a new one and his old one is fine, he did just get it in July, he states he really wants the [place]

## 2017-03-30 NOTE — Progress Notes (Signed)
Subjective: [name] is a 66 y.o. male patient seen today in office for POV #29 (DOS 06-15-16), S/P Left wound debridement and placement of stravix allograft. Patient is using regranex with improvement, denies pain at surgical site, states that when he does have pain sometimes 3/10, unchanged from prior; denies calf pain, denies headache, chest pain, shortness of breath, nausea, vomiting, fever, or chills. Patient states that he is doing well with blood sugars now that he is on insulin pump however has had issues with low readings and some nausea. No other issues noted.   Patient Active Problem List   Diagnosis Date Noted  . Chronic pain syndrome 09/18/2015  . Preventative health care 03/12/2015  . Acute osteomyelitis of toe of left foot ([place])   . Foot ulcer, left ([place]) 12/13/2014  . Paroxysmal atrial fibrillation ([place]) 12/13/2014  . Elevated alkaline phosphatase level 06/11/2014  . Constipation 05/31/2014  . Vitamin D deficiency 11/17/2013  . Anemia 02/10/2012  . Hypertension 11/18/2011  . Obstructive sleep apnea 12/12/2009  . GERD 06/27/2008  . Diabetes type 2, uncontrolled ([place]) 12/28/2007  . DIABETIC MACULAR EDEMA 10/07/2007  . Dyslipidemia 08/15/2007  . BACKGROUND DIABETIC RETINOPATHY 08/15/2007  . HEARING LOSS, SENSORINEURAL, BILATERAL 01/03/2007  . Diabetic peripheral neuropathy associated with type 2 diabetes mellitus ([place]) 12/16/2006  . Chronic kidney disease, stage III (moderate) 12/01/2006  . Morbid obesity ([place]) 08/23/2006  . STATUS, OTHER TOE(S) AMPUTATION 05/21/2006    Current Outpatient Prescriptions on File Prior to Visit  Medication Sig Dispense Refill  . ACCU-CHEK FASTCLIX LANCETS MISC Check blood sugar 5 times a day 408 each 3  . atorvastatin (LIPITOR) 20 MG tablet TAKE 1 TABLET(20 MG) BY MOUTH DAILY 90 tablet 0  . becaplermin (REGRANEX) 0.01 % gel Apply 1 application topically daily. 15 g 5  . Blood Glucose Monitoring Suppl (ACCU-CHEK AVIVA PLUS)  w/Device KIT CHECK BLOOD SUGAR 5 TIMES A DAY 1 kit 0  . Cholecalciferol (VITAMIN D3) 2000 UNITS capsule Take 1 capsule (2,000 Units total) by mouth daily. 90 capsule 0  . diclofenac sodium (VOLTAREN) 1 % GEL Apply 4 g topically 4 (four) times daily. 100 g 2  . docusate sodium (COLACE) 100 MG capsule Take 1 capsule (100 mg total) by mouth daily as needed for mild constipation. 14 capsule 0  . ELIQUIS 5 MG TABS tablet TAKE 1 TABLET(5 MG) BY MOUTH TWICE DAILY 180 tablet 3  . enalapril (VASOTEC) 20 MG tablet TAKE 2 TABLETS(40 MG) BY MOUTH DAILY 180 tablet 1  . furosemide (LASIX) 40 MG tablet TAKE 1 TABLET(40 MG) BY MOUTH TWICE DAILY 180 tablet 0  . gabapentin (NEURONTIN) 300 MG capsule TAKE 1 CAPSULE(300 MG) BY MOUTH AT BEDTIME 90 capsule 3  . glucose blood (ACCU-CHEK AVIVA PLUS) test strip USE TO CHECK BLOOD Five TIMES DAILY 450 each 3  . Insulin Disposable Pump (V-GO 20) KIT Use 1 pimp daily with insulin 30 kit 4  . Insulin Pen Needle 31G X 5 MM MISC Use for insulin injection 5 times a day. 150 each 5  . insulin regular human CONCENTRATED (HUMULIN R U-500 KWIKPEN) 500 UNIT/ML kwikpen INJECT 35 UNITS AT BREAKFAST, 60 UNITS AT LUNCH, AND 70 UNITS AT DINNER (Patient taking differently: INJECT 35 UNITS AT BREAKFAST, 60 UNITS AT LUNCH, AND 70 UNITS AT DINNER; pt stated, "Have a pump and uses syringes") 20 mL 2  . Insulin Syringe-Needle U-100 25G X 1" 1 ML MISC Use 1 syringe daily to fill V-Go pump  30 each 3  . metoprolol succinate (TOPROL-XL) 25 MG 24 hr tablet TAKE 1 TABLET(25 MG) BY MOUTH DAILY 90 tablet 2  . omeprazole (PRILOSEC) 20 MG capsule TAKE 1 CAPSULE(20 MG) BY MOUTH DAILY 90 capsule 1  . PROAIR HFA 108 (90 Base) MCG/ACT inhaler INHALE 2 PUFFS INTO THE LUNGS BY MOUTH EVERY 6 HOURS AS NEEDED FOR WHEEZING OR SHORTNESS OF BREATH 8.5 g 6  . silver sulfADIAZINE (SILVADENE) 1 % cream Apply 1 application topically daily. 50 g 1  . SIMBRINZA 1-0.2 % SUSP Place 1 drop into both eyes 2 (two) times daily.  Take as instructed by Dr. Katy Fitch.    . TOUJEO SOLOSTAR 300 UNIT/ML SOPN INJECT 60 UNITS INTO THE SKIN EVERY MORNING AND 90 UNITS INTO THE SKIN AT NIGHT 9 pen 0  . VICTOZA 18 MG/3ML SOPN ADMINISTER 1.8 MG UNDER THE SKIN AT THE SAME TIME EVERY DAY 9 mL 0  . HYDROcodone-acetaminophen (NORCO) 7.5-325 MG tablet Take 1 tablet by mouth every 6 (six) hours as needed for moderate pain. 130 tablet 0   No current facility-administered medications on file prior to visit.     Allergies  Allergen Reactions  . Vancomycin     REACTION: ARF    Objective: There were no vitals filed for this visit.  General: No acute distress, AAOx3  Left foot: Plantar forefoot, wound limited to skin, wound bed measuring 1.0x0.5x0.2cm (same as previous) with mild periwound keratosis, no swelling to left foot, no erythema, no warmth, no active drainage however on inner guaze there is drainage bloody drainage with no acute signs of infection noted, Capillary fill time <3 seconds in all digits remaining, amputation status of 2nd and 3rd toes. No pain with calf compression.   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[place])    -  Primary   Diabetes mellitus due to underlying condition with diabetic polyneuropathy, unspecified whether long term insulin use ([place])       Foot amputation status, left (H[facility])       PVD (peripheral vascular disease) ([place])           -Patient seen and evaluated -Cleansed wound debirded wound margins excisionlly to healthy bleeding margins through the level of dermis and applied antibiotic cream and offloading padding without complication, post debridement measurements as above -Advised patient to make sure to keep dressings clean, dry, and intact allowing nursing to change using regranex and offloading padding; Advised patient to call office if refill is needed on Regranex. -Rx Phenergan for nausea unrelated  to foot  -Advised patient to continue with forefoot offloading post-op shoe on left foot with use of cane -Advised patient to limit activity to necessity  -Advised patient to rest and elevate as necessary -Patient to return for wound check in 2 weeks. In the meantime, patient to call office if any issues or problems arise. MAY CONSIDER EPIFIX IF CONTINUES TO REMAIN THE SAME SIZE AT NEXT VISIT.   Landis Martins, DPM

## 2017-03-31 DIAGNOSIS — G894 Chronic pain syndrome: Secondary | ICD-10-CM | POA: Diagnosis not present

## 2017-03-31 DIAGNOSIS — Z7901 Long term (current) use of anticoagulants: Secondary | ICD-10-CM | POA: Diagnosis not present

## 2017-03-31 DIAGNOSIS — Z89422 Acquired absence of other left toe(s): Secondary | ICD-10-CM | POA: Diagnosis not present

## 2017-03-31 DIAGNOSIS — E11311 Type 2 diabetes mellitus with unspecified diabetic retinopathy with macular edema: Secondary | ICD-10-CM | POA: Diagnosis not present

## 2017-03-31 DIAGNOSIS — Z4800 Encounter for change or removal of nonsurgical wound dressing: Secondary | ICD-10-CM | POA: Diagnosis not present

## 2017-03-31 DIAGNOSIS — N183 Chronic kidney disease, stage 3 (moderate): Secondary | ICD-10-CM | POA: Diagnosis not present

## 2017-03-31 DIAGNOSIS — Z794 Long term (current) use of insulin: Secondary | ICD-10-CM | POA: Diagnosis not present

## 2017-03-31 DIAGNOSIS — L97522 Non-pressure chronic ulcer of other part of left foot with fat layer exposed: Secondary | ICD-10-CM | POA: Diagnosis not present

## 2017-03-31 DIAGNOSIS — I129 Hypertensive chronic kidney disease with stage 1 through stage 4 chronic kidney disease, or unspecified chronic kidney disease: Secondary | ICD-10-CM | POA: Diagnosis not present

## 2017-03-31 DIAGNOSIS — Z79891 Long term (current) use of opiate analgesic: Secondary | ICD-10-CM | POA: Diagnosis not present

## 2017-03-31 DIAGNOSIS — M79672 Pain in left foot: Secondary | ICD-10-CM | POA: Diagnosis not present

## 2017-03-31 DIAGNOSIS — E11621 Type 2 diabetes mellitus with foot ulcer: Secondary | ICD-10-CM | POA: Diagnosis not present

## 2017-03-31 DIAGNOSIS — M25569 Pain in unspecified knee: Secondary | ICD-10-CM | POA: Diagnosis not present

## 2017-03-31 DIAGNOSIS — E1142 Type 2 diabetes mellitus with diabetic polyneuropathy: Secondary | ICD-10-CM | POA: Diagnosis not present

## 2017-03-31 DIAGNOSIS — E1122 Type 2 diabetes mellitus with diabetic chronic kidney disease: Secondary | ICD-10-CM | POA: Diagnosis not present

## 2017-03-31 DIAGNOSIS — M545 Low back pain: Secondary | ICD-10-CM | POA: Diagnosis not present

## 2017-04-02 ENCOUNTER — Other Ambulatory Visit: Payer: Self-pay | Admitting: Dietician

## 2017-04-02 DIAGNOSIS — Z4800 Encounter for change or removal of nonsurgical wound dressing: Secondary | ICD-10-CM | POA: Diagnosis not present

## 2017-04-02 DIAGNOSIS — E1122 Type 2 diabetes mellitus with diabetic chronic kidney disease: Secondary | ICD-10-CM | POA: Diagnosis not present

## 2017-04-02 DIAGNOSIS — Z794 Long term (current) use of insulin: Principal | ICD-10-CM

## 2017-04-02 DIAGNOSIS — L97522 Non-pressure chronic ulcer of other part of left foot with fat layer exposed: Secondary | ICD-10-CM | POA: Diagnosis not present

## 2017-04-02 DIAGNOSIS — I129 Hypertensive chronic kidney disease with stage 1 through stage 4 chronic kidney disease, or unspecified chronic kidney disease: Secondary | ICD-10-CM | POA: Diagnosis not present

## 2017-04-02 DIAGNOSIS — E11311 Type 2 diabetes mellitus with unspecified diabetic retinopathy with macular edema: Secondary | ICD-10-CM | POA: Diagnosis not present

## 2017-04-02 DIAGNOSIS — N183 Chronic kidney disease, stage 3 (moderate): Secondary | ICD-10-CM | POA: Diagnosis not present

## 2017-04-02 DIAGNOSIS — E1165 Type 2 diabetes mellitus with hyperglycemia: Principal | ICD-10-CM

## 2017-04-02 DIAGNOSIS — IMO0002 Reserved for concepts with insufficient information to code with codable children: Secondary | ICD-10-CM

## 2017-04-02 DIAGNOSIS — Z7901 Long term (current) use of anticoagulants: Secondary | ICD-10-CM | POA: Diagnosis not present

## 2017-04-02 DIAGNOSIS — Z89422 Acquired absence of other left toe(s): Secondary | ICD-10-CM | POA: Diagnosis not present

## 2017-04-02 DIAGNOSIS — E1142 Type 2 diabetes mellitus with diabetic polyneuropathy: Secondary | ICD-10-CM | POA: Diagnosis not present

## 2017-04-02 DIAGNOSIS — E11621 Type 2 diabetes mellitus with foot ulcer: Secondary | ICD-10-CM | POA: Diagnosis not present

## 2017-04-02 NOTE — Progress Notes (Signed)
Thanks Done  

## 2017-04-02 NOTE — Progress Notes (Signed)
Can you put in this order for em and I will cosign it. I am not sure which order this would be. Thanks

## 2017-04-02 NOTE — Progress Notes (Unsigned)
Referral requested for CGM training. The order is in this note pending your approval

## 2017-04-02 NOTE — Addendum Note (Signed)
Addended by: Resa Miner on: 04/02/2017 10:36 AM   Modules accepted: Orders

## 2017-04-02 NOTE — Telephone Encounter (Signed)
Request referral for education on Freestyle libre CGM

## 2017-04-05 DIAGNOSIS — E11621 Type 2 diabetes mellitus with foot ulcer: Secondary | ICD-10-CM | POA: Diagnosis not present

## 2017-04-05 DIAGNOSIS — I129 Hypertensive chronic kidney disease with stage 1 through stage 4 chronic kidney disease, or unspecified chronic kidney disease: Secondary | ICD-10-CM | POA: Diagnosis not present

## 2017-04-05 DIAGNOSIS — H401132 Primary open-angle glaucoma, bilateral, moderate stage: Secondary | ICD-10-CM | POA: Diagnosis not present

## 2017-04-05 DIAGNOSIS — Z794 Long term (current) use of insulin: Secondary | ICD-10-CM | POA: Diagnosis not present

## 2017-04-05 DIAGNOSIS — Z89422 Acquired absence of other left toe(s): Secondary | ICD-10-CM | POA: Diagnosis not present

## 2017-04-05 DIAGNOSIS — Z4800 Encounter for change or removal of nonsurgical wound dressing: Secondary | ICD-10-CM | POA: Diagnosis not present

## 2017-04-05 DIAGNOSIS — Z9114 Patient's other noncompliance with medication regimen: Secondary | ICD-10-CM | POA: Diagnosis not present

## 2017-04-05 DIAGNOSIS — E1142 Type 2 diabetes mellitus with diabetic polyneuropathy: Secondary | ICD-10-CM | POA: Diagnosis not present

## 2017-04-05 DIAGNOSIS — E1122 Type 2 diabetes mellitus with diabetic chronic kidney disease: Secondary | ICD-10-CM | POA: Diagnosis not present

## 2017-04-05 DIAGNOSIS — L97522 Non-pressure chronic ulcer of other part of left foot with fat layer exposed: Secondary | ICD-10-CM | POA: Diagnosis not present

## 2017-04-05 DIAGNOSIS — H40053 Ocular hypertension, bilateral: Secondary | ICD-10-CM | POA: Diagnosis not present

## 2017-04-05 DIAGNOSIS — N183 Chronic kidney disease, stage 3 (moderate): Secondary | ICD-10-CM | POA: Diagnosis not present

## 2017-04-05 DIAGNOSIS — Z7901 Long term (current) use of anticoagulants: Secondary | ICD-10-CM | POA: Diagnosis not present

## 2017-04-05 DIAGNOSIS — E113512 Type 2 diabetes mellitus with proliferative diabetic retinopathy with macular edema, left eye: Secondary | ICD-10-CM | POA: Diagnosis not present

## 2017-04-05 DIAGNOSIS — E11311 Type 2 diabetes mellitus with unspecified diabetic retinopathy with macular edema: Secondary | ICD-10-CM | POA: Diagnosis not present

## 2017-04-06 ENCOUNTER — Ambulatory Visit: Payer: Medicare Other | Admitting: Dietician

## 2017-04-07 DIAGNOSIS — E11311 Type 2 diabetes mellitus with unspecified diabetic retinopathy with macular edema: Secondary | ICD-10-CM | POA: Diagnosis not present

## 2017-04-07 DIAGNOSIS — Z89422 Acquired absence of other left toe(s): Secondary | ICD-10-CM | POA: Diagnosis not present

## 2017-04-07 DIAGNOSIS — E1142 Type 2 diabetes mellitus with diabetic polyneuropathy: Secondary | ICD-10-CM | POA: Diagnosis not present

## 2017-04-07 DIAGNOSIS — I129 Hypertensive chronic kidney disease with stage 1 through stage 4 chronic kidney disease, or unspecified chronic kidney disease: Secondary | ICD-10-CM | POA: Diagnosis not present

## 2017-04-07 DIAGNOSIS — Z7901 Long term (current) use of anticoagulants: Secondary | ICD-10-CM | POA: Diagnosis not present

## 2017-04-07 DIAGNOSIS — E11621 Type 2 diabetes mellitus with foot ulcer: Secondary | ICD-10-CM | POA: Diagnosis not present

## 2017-04-07 DIAGNOSIS — E1122 Type 2 diabetes mellitus with diabetic chronic kidney disease: Secondary | ICD-10-CM | POA: Diagnosis not present

## 2017-04-07 DIAGNOSIS — N183 Chronic kidney disease, stage 3 (moderate): Secondary | ICD-10-CM | POA: Diagnosis not present

## 2017-04-07 DIAGNOSIS — Z794 Long term (current) use of insulin: Secondary | ICD-10-CM | POA: Diagnosis not present

## 2017-04-07 DIAGNOSIS — Z4800 Encounter for change or removal of nonsurgical wound dressing: Secondary | ICD-10-CM | POA: Diagnosis not present

## 2017-04-07 DIAGNOSIS — L97522 Non-pressure chronic ulcer of other part of left foot with fat layer exposed: Secondary | ICD-10-CM | POA: Diagnosis not present

## 2017-04-09 ENCOUNTER — Other Ambulatory Visit (INDEPENDENT_AMBULATORY_CARE_PROVIDER_SITE_OTHER): Payer: Medicare Other

## 2017-04-09 DIAGNOSIS — Z7901 Long term (current) use of anticoagulants: Secondary | ICD-10-CM | POA: Diagnosis not present

## 2017-04-09 DIAGNOSIS — Z794 Long term (current) use of insulin: Secondary | ICD-10-CM

## 2017-04-09 DIAGNOSIS — Z4800 Encounter for change or removal of nonsurgical wound dressing: Secondary | ICD-10-CM | POA: Diagnosis not present

## 2017-04-09 DIAGNOSIS — E11621 Type 2 diabetes mellitus with foot ulcer: Secondary | ICD-10-CM | POA: Diagnosis not present

## 2017-04-09 DIAGNOSIS — L97522 Non-pressure chronic ulcer of other part of left foot with fat layer exposed: Secondary | ICD-10-CM | POA: Diagnosis not present

## 2017-04-09 DIAGNOSIS — E1165 Type 2 diabetes mellitus with hyperglycemia: Secondary | ICD-10-CM | POA: Diagnosis not present

## 2017-04-09 DIAGNOSIS — E1142 Type 2 diabetes mellitus with diabetic polyneuropathy: Secondary | ICD-10-CM | POA: Diagnosis not present

## 2017-04-09 DIAGNOSIS — Z89422 Acquired absence of other left toe(s): Secondary | ICD-10-CM | POA: Diagnosis not present

## 2017-04-09 DIAGNOSIS — E11311 Type 2 diabetes mellitus with unspecified diabetic retinopathy with macular edema: Secondary | ICD-10-CM | POA: Diagnosis not present

## 2017-04-09 DIAGNOSIS — N183 Chronic kidney disease, stage 3 (moderate): Secondary | ICD-10-CM | POA: Diagnosis not present

## 2017-04-09 DIAGNOSIS — I129 Hypertensive chronic kidney disease with stage 1 through stage 4 chronic kidney disease, or unspecified chronic kidney disease: Secondary | ICD-10-CM | POA: Diagnosis not present

## 2017-04-09 DIAGNOSIS — E1122 Type 2 diabetes mellitus with diabetic chronic kidney disease: Secondary | ICD-10-CM | POA: Diagnosis not present

## 2017-04-09 LAB — BASIC METABOLIC PANEL
BUN: 29 mg/dL — AB (ref 6–23)
CALCIUM: 8.8 mg/dL (ref 8.4–10.5)
CO2: 26 mEq/L (ref 19–32)
Chloride: 106 mEq/L (ref 96–112)
Creatinine, Ser: 1.84 mg/dL — ABNORMAL HIGH (ref 0.40–1.50)
GFR: 47.41 mL/min — AB (ref >60.00)
GLUCOSE: 89 mg/dL (ref 70–99)
POTASSIUM: 4.5 meq/L (ref 3.5–5.1)
SODIUM: 139 meq/L (ref 135–145)

## 2017-04-09 LAB — MICROALBUMIN / CREATININE URINE RATIO
Creatinine,U: 114.4 mg/dL
MICROALB UR: 7.7 mg/dL — AB (ref 0.0–1.9)
Microalb Creat Ratio: 6.7 mg/g (ref 0.0–30.0)

## 2017-04-10 ENCOUNTER — Other Ambulatory Visit: Payer: Self-pay | Admitting: Endocrinology

## 2017-04-10 LAB — FRUCTOSAMINE: Fructosamine: 210 umol/L (ref 0–285)

## 2017-04-12 DIAGNOSIS — E1122 Type 2 diabetes mellitus with diabetic chronic kidney disease: Secondary | ICD-10-CM | POA: Diagnosis not present

## 2017-04-12 DIAGNOSIS — E11311 Type 2 diabetes mellitus with unspecified diabetic retinopathy with macular edema: Secondary | ICD-10-CM | POA: Diagnosis not present

## 2017-04-12 DIAGNOSIS — Z794 Long term (current) use of insulin: Secondary | ICD-10-CM | POA: Diagnosis not present

## 2017-04-12 DIAGNOSIS — E1142 Type 2 diabetes mellitus with diabetic polyneuropathy: Secondary | ICD-10-CM | POA: Diagnosis not present

## 2017-04-12 DIAGNOSIS — L97522 Non-pressure chronic ulcer of other part of left foot with fat layer exposed: Secondary | ICD-10-CM | POA: Diagnosis not present

## 2017-04-12 DIAGNOSIS — Z89422 Acquired absence of other left toe(s): Secondary | ICD-10-CM | POA: Diagnosis not present

## 2017-04-12 DIAGNOSIS — Z4800 Encounter for change or removal of nonsurgical wound dressing: Secondary | ICD-10-CM | POA: Diagnosis not present

## 2017-04-12 DIAGNOSIS — E11621 Type 2 diabetes mellitus with foot ulcer: Secondary | ICD-10-CM | POA: Diagnosis not present

## 2017-04-12 DIAGNOSIS — N183 Chronic kidney disease, stage 3 (moderate): Secondary | ICD-10-CM | POA: Diagnosis not present

## 2017-04-12 DIAGNOSIS — I129 Hypertensive chronic kidney disease with stage 1 through stage 4 chronic kidney disease, or unspecified chronic kidney disease: Secondary | ICD-10-CM | POA: Diagnosis not present

## 2017-04-12 DIAGNOSIS — Z7901 Long term (current) use of anticoagulants: Secondary | ICD-10-CM | POA: Diagnosis not present

## 2017-04-13 ENCOUNTER — Encounter: Payer: Self-pay | Admitting: Dietician

## 2017-04-13 ENCOUNTER — Encounter: Payer: Self-pay | Admitting: Endocrinology

## 2017-04-13 ENCOUNTER — Ambulatory Visit (INDEPENDENT_AMBULATORY_CARE_PROVIDER_SITE_OTHER): Payer: Medicare Other | Admitting: Endocrinology

## 2017-04-13 ENCOUNTER — Ambulatory Visit (INDEPENDENT_AMBULATORY_CARE_PROVIDER_SITE_OTHER): Payer: Medicare Other | Admitting: Dietician

## 2017-04-13 ENCOUNTER — Encounter: Payer: Medicare Other | Admitting: Internal Medicine

## 2017-04-13 VITALS — BP 120/70 | HR 68 | Ht 77.25 in | Wt >= 6400 oz

## 2017-04-13 DIAGNOSIS — E1165 Type 2 diabetes mellitus with hyperglycemia: Secondary | ICD-10-CM | POA: Diagnosis not present

## 2017-04-13 DIAGNOSIS — Z794 Long term (current) use of insulin: Secondary | ICD-10-CM | POA: Diagnosis not present

## 2017-04-13 DIAGNOSIS — E1142 Type 2 diabetes mellitus with diabetic polyneuropathy: Secondary | ICD-10-CM

## 2017-04-13 DIAGNOSIS — Z23 Encounter for immunization: Secondary | ICD-10-CM | POA: Diagnosis not present

## 2017-04-13 DIAGNOSIS — E119 Type 2 diabetes mellitus without complications: Secondary | ICD-10-CM | POA: Diagnosis not present

## 2017-04-13 DIAGNOSIS — R635 Abnormal weight gain: Secondary | ICD-10-CM

## 2017-04-13 NOTE — Progress Notes (Signed)
Mr. Champine was educated about how to place [facility] personal CGM sensor and how to use the reader. His target was kept at the factory setting if 100-140 and he was encouraged to discuss this with his endocrinologist. He verbalized and demonstrated understanding to the education by placing his own sensor this morning.  He also watched several videos online about using the reader to self manage his blood glucose.   He is wearing the Vgo20 with u500 insulin in it. He reports feeling more self confident in filling and using it. He says he likes using it. His blood sugar was 90 in the office and he reports it was 70 this am fasting. He did not take clicks because he did not eat breakfast. He wanted to know what to do if his blood sugar is high at bedtime and he knows he has more clicks left. I encouraged him to discuss this with Dr.Kumar.  Julianah Marciel, Butch Penny, RD 04/13/2017 9:56 AM.

## 2017-04-13 NOTE — Patient Instructions (Addendum)
Breakfast 2 clicks Lunch 4 at lunch and 5 at dinner  Call if low sugar more often  Bedtime snack with some carbs

## 2017-04-13 NOTE — Progress Notes (Signed)
Patient ID: [name], male   DOB: 1951-06-25, 66 y.o.   MRN: [id number]           Reason for Appointment: Follow-up for Type 2 Diabetes  Referring physician: Dareen Piano  History of Present Illness:          Date of diagnosis of type 2 diabetes mellitus: 2007       Background history:   He was diagnosed to have diabetes when he had an ulcer on his left third toe which led to amputation.  His glucose was apparently about 400 and he was started on insulin at that time He does not know if he has taken any oral hypoglycemic drugs in the past His A1c in 2016 has been consistently over 8% On his initial consultation was started on Victoza and subsequently started on U-500 insulin in 2/17 along with Toujeo  Recent history:   Since 8/18 has been using V-go PUMP 20 unit basal, Humulin R U-500 insulin with boluses 8 units breakfast, 12 units 14 unis Changes his pump at 8 am  INSULIN regimen previously was:  TOUJEO  74 in the morning and 74 in the evening HUMULIN U-500 insulin: 20/40 units in the morning, 60/70 at lunch and 70 at supper  Non-insulin hypoglycemic drugs the patient is taking are: Victoza 1.8 mg daily        His last A1c was 9.3, now fructosamine is 210  Current blood sugar patterns and problems identified:  His blood sugars prior to starting the V-go pump were fluctuating significantly including overnight  More recently he still has fluctuations in his blood sugars especially in the afternoons  However he has a trend of low sugars being relatively low overnight in gradually getting higher towards the evenings on an average  More recently in the last 10-14 days however his blood sugars are mostly near normal with only sporadic high readings in the afternoon and evening  He is getting periodic hypoglycemia overnight with blood sugars in the 40s  Also HYPOGLYCEMIC periodically around 4 PM but usually not after evening meal  His postprandial readings probably  depend on what he is eating and with going off his diet and having juice and alcoholic drinks his blood sugar was 337 last Saturday  He has no difficulty using the V-go pump, he is feeling it up as directed and changing it daily and has no local skin issues or adhesion issues  He has however gained significant amount of weight  Also says that sometimes he gets excessively hungry  He has been using the ACCU-CHEK meter but was able to get the freestyle Libre sensor and this was started on him today with help of the nurse educator  He has seen the dietitian in the past  Hypoglycemia: Minimal  Glucose monitoring:  done 3-4  times a day         Glucometer:  Accu-Chek     Blood Glucose readings by download:   Mean values apply above for all meters except median for One Touch  PRE-MEAL Fasting Lunch Dinner Bedtime Overall  Glucose range:  57-1 46   74-1 91   50-3 39  50-3 37    Mean/median: 97  127  145  179  137+/-62      Self-care:  Typical meal intake: Breakfast is sometimes fruit or cereal, Sometimes eating sausage and grits Has been trying to avoid high-fat foods or fried food  Lunch 12 noon Dinner at 5-6 pm He drinks  cranberry or V-8 juice and diet drinks                Dietician consultation: 12/16               Exercise: not walking,  has chronic foot ulcer on the left  Weight history: Previous range 260-410  Wt Readings from Last 3 Encounters:  04/13/17 (!) 404 lb (183.3 kg)  03/05/17 (!) 393 lb 3.2 oz (178.4 kg)  01/12/17 (!) 394 lb 12.8 oz (179.1 kg)    Glycemic control:   Lab Results  Component Value Date   HGBA1C 9.3 (H) 03/01/2017   HGBA1C 9.2 (H) 10/22/2016   HGBA1C 9.7 (H) 07/16/2016   Lab Results  Component Value Date   MICROALBUR 7.7 (H) 04/09/2017   LDLCALC 57 10/22/2016   CREATININE 1.84 (H) 04/09/2017    OTHER problems review today: See review of systems     Lab on 04/09/2017  Component Date Value Ref Range Status  . Sodium 04/09/2017 139   135 - 145 mEq/L Final  . Potassium 04/09/2017 4.5  3.5 - 5.1 mEq/L Final  . Chloride 04/09/2017 106  96 - 112 mEq/L Final  . CO2 04/09/2017 26  19 - 32 mEq/L Final  . Glucose, Bld 04/09/2017 89  70 - 99 mg/dL Final  . BUN 04/09/2017 29* 6 - 23 mg/dL Final  . Creatinine, Ser 04/09/2017 1.84* 0.40 - 1.50 mg/dL Final  . Calcium 04/09/2017 8.8  8.4 - 10.5 mg/dL Final  . GFR 04/09/2017 47.41* >60.00 mL/min Final  . Fructosamine 04/09/2017 210  0 - 285 umol/L Final   Comment: Published reference interval for apparently healthy subjects between age 64 and 42 is 47 - 285 umol/L and in a poorly controlled diabetic population is 228 - 563 umol/L with a mean of 396 umol/L.   Marland Kitchen Microalb, Ur 04/09/2017 7.7* 0.0 - 1.9 mg/dL Final  . Creatinine,U 04/09/2017 114.4  mg/dL Final  . Microalb Creat Ratio 04/09/2017 6.7  0.0 - 30.0 mg/g Final       Allergies as of 04/13/2017      Reactions   Vancomycin    REACTION: ARF      Medication List       Accurate as of 04/13/17  8:32 PM. Always use your most recent med list.          ACCU-CHEK AVIVA PLUS w/Device Kit CHECK BLOOD SUGAR 5 TIMES A DAY   ACCU-CHEK FASTCLIX LANCETS Misc Check blood sugar 5 times a day   atorvastatin 20 MG tablet Commonly known as:  LIPITOR TAKE 1 TABLET(20 MG) BY MOUTH DAILY   becaplermin 0.01 % gel Commonly known as:  REGRANEX Apply 1 application topically daily.   diclofenac sodium 1 % Gel Commonly known as:  VOLTAREN Apply 4 g topically 4 (four) times daily.   docusate sodium 100 MG capsule Commonly known as:  COLACE Take 1 capsule (100 mg total) by mouth daily as needed for mild constipation.   ELIQUIS 5 MG Tabs tablet Generic drug:  apixaban TAKE 1 TABLET(5 MG) BY MOUTH TWICE DAILY   enalapril 20 MG tablet Commonly known as:  VASOTEC TAKE 2 TABLETS(40 MG) BY MOUTH DAILY   furosemide 40 MG tablet Commonly known as:  LASIX Take 1 tablet (40 mg total) by mouth 2 (two) times daily.   gabapentin  300 MG capsule Commonly known as:  NEURONTIN TAKE 1 CAPSULE(300 MG) BY MOUTH AT BEDTIME   glucose blood test strip Commonly  known as:  ACCU-CHEK AVIVA PLUS USE TO CHECK BLOOD Five TIMES DAILY   HYDROcodone-acetaminophen 7.5-325 MG tablet Commonly known as:  NORCO Take 1 tablet by mouth every 6 (six) hours as needed for moderate pain.   Insulin Pen Needle 31G X 5 MM Misc Use for insulin injection 5 times a day.   insulin regular human CONCENTRATED 500 UNIT/ML kwikpen Commonly known as:  HUMULIN R U-500 KWIKPEN INJECT 35 UNITS AT BREAKFAST, 60 UNITS AT LUNCH, AND 70 UNITS AT DINNER   HUMULIN R 500 UNIT/ML injection Generic drug:  insulin regular human CONCENTRATED DRAW INSULIN TO THE 68 UNIT LINE ON THE U-100 SYRINGE(TO USE 340 UNITS) PER DAY AS DIRECTED   Insulin Syringe-Needle U-100 25G X 1" 1 ML Misc Use 1 syringe daily to fill V-Go pump   metoprolol succinate 25 MG 24 hr tablet Commonly known as:  TOPROL-XL TAKE 1 TABLET(25 MG) BY MOUTH DAILY   omeprazole 20 MG capsule Commonly known as:  PRILOSEC TAKE 1 CAPSULE(20 MG) BY MOUTH DAILY   PROAIR HFA 108 (90 Base) MCG/ACT inhaler Generic drug:  albuterol INHALE 2 PUFFS INTO THE LUNGS BY MOUTH EVERY 6 HOURS AS NEEDED FOR WHEEZING OR SHORTNESS OF BREATH   promethazine 25 MG tablet Commonly known as:  PHENERGAN Take 1 tablet (25 mg total) by mouth every 8 (eight) hours as needed for nausea or vomiting.   silver sulfADIAZINE 1 % cream Commonly known as:  SILVADENE Apply 1 application topically daily.   SIMBRINZA 1-0.2 % Susp Generic drug:  Brinzolamide-Brimonidine Place 1 drop into both eyes 2 (two) times daily. Take as instructed by Dr. Katy Fitch.   TOUJEO SOLOSTAR 300 UNIT/ML Sopn Generic drug:  Insulin Glargine INJECT 60 UNITS INTO THE SKIN EVERY MORNING AND 90 UNITS INTO THE SKIN AT NIGHT   V-GO 20 Kit Use 1 pimp daily with insulin   VICTOZA 18 MG/3ML Sopn Generic drug:  liraglutide Inject 0.3 mLs (1.8 mg total)  into the skin daily.   Vitamin D3 2000 units capsule Take 1 capsule (2,000 Units total) by mouth daily.       Allergies:  Allergies  Allergen Reactions  . Vancomycin     REACTION: ARF    Past Medical History:  Diagnosis Date  . Arthritis    "elbows & knees" (12/13/2014)  . Asthma   . CKD (chronic kidney disease), stage III ([place])   . DIABETIC FOOT ULCER 06/20/2009  . Edema, macular, due to secondary diabetes ([place])   . Erectile dysfunction   . GERD (gastroesophageal reflux disease)   . HEARING LOSS, SENSORINEURAL, BILATERAL 01/03/2007   Seen by ENT Dr. Orpah Greek D. Redmond Baseman 01/03/07  . Hemorrhoids   . History of echocardiogram    a. 04/2008 Echo: EF 50-55%, abnl LV relaxation, mildly dil LA.  Marland Kitchen Hyperlipidemia   . Hypertension   . Morbid obesity ([place])   . Neuropathy, lower extremity   . OSA (obstructive sleep apnea)    uses CPAP nightly  . OSA on CPAP    Nocturnal polysomnogram on 01/21/2010 showed severe obstructive sleep apnea/hypopnea syndrome, AHI 74.1 per hour with non positional events, moderately loud snoring, and oxygen desaturation to a nadir of 78% on room air.  CPAP was successfully titrated to 17 CWP, AHI 1.1 per hour using a large ResMed Mirage Quattro full-face mask with heated humidifier. Bruxism was noted.   . Osteomyelitis of ankle and foot ([place])   . Retinopathy   . Type II diabetes mellitus ([place])  w/complication NOS, type II    Past Surgical History:  Procedure Laterality Date  . AMPUTATION Left 12/15/2014   Procedure: LEFT SECOND TOE AMPUTATION ;  Surgeon: Dorna Leitz, MD;  Location: [facility];  Service: Orthopedics;  Laterality: Left;  . 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 Kathalene Frames. Mayer Camel.  . WOUND DEBRIDEMENT Left 06/15/2016   Procedure: DEBRIDEMENT WOUND LEFT FOOT WITH GRAFT APPLICATION;  Surgeon: Landis Martins, DPM;  Location: [facility];  Service: Podiatry;   Laterality: Left;    Family History  Problem Relation Age of Onset  . Diabetes Mother        also 2 siblings  . Heart attack Father 55  . Throat cancer Brother     Social History:  reports that he quit smoking about 7 years ago. His smoking use included Cigarettes. He has a 15.00 pack-year smoking history. He has never used smokeless tobacco. He reports that he drinks about 6.6 oz of alcohol per week . He reports that he does not use drugs.    Review of Systems     Lipid management: taking Lipitor 20 mg, followed by PCP    Lab Results  Component Value Date   CHOL 110 10/22/2016   HDL 28.50 (L) 10/22/2016   LDLCALC 57 10/22/2016   TRIG 126.0 10/22/2016   CHOLHDL 4 10/22/2016            Hypertension: Has had hypertension for a few years treated with enalapril,  taking 40 mg  Also on Lasix and 25 mg metoprolol This is treated by PCP  He also has chronic renal dysfunction  Lab Results  Component Value Date   CREATININE 1.84 (H) 04/09/2017   CREATININE 1.56 (H) 03/01/2017   CREATININE 1.66 (H) 01/12/2017      Last foot exam done by podiatrist Has been seen regularly by podiatrist for chronic ulcer   Physical Examination:  BP 120/70   Pulse 68   Ht 6' 5.25" (1.962 m)   Wt (!) 404 lb (183.3 kg)   SpO2 98%   BMI 47.60 kg/m    no significant edema present    ASSESSMENT:  Diabetes type 2, uncontrolled with morbid obesity  See history of present illness for detailed discussion of current diabetes management, blood sugar patterns and problems identified  He is  insulin resistant and taking Up to almost 300 units of insulin prior to starting the V-go pump  With starting the V-go pump his blood sugars are very significantly better, fructosamine now 210 More recently starting to get hypoglycemic periodically also at various times but usually not after evening meal  This may be causing weight gain also Most likely is getting too much bolus insulin for breakfast  and lunch and possibly the basal at night   Weight gain: This is likely to be from overall lower blood sugars were also tendency to hypoglycemia, no edema recently   PLAN:     Will need to have him reduce his boluses significantly by 4 units at each meal  However discussed that if he starts having low sugars again overnight despite a bedtime snack will need to switch him to an Omnipod pump, discussed briefly the differences between the 2 pumps   This may also allow adjustment of basal rate at various times  For his weight gain he needs to put back on portions but may be able to have less  hunger sensation with reducing his insulin  Continue Victoza for multiple benefits, also potentially  may need to switch to Ozempic for better efficacy  if he continues to have increased weight gain   He will also started using the freestyle Libre sensor and discussed that he needs to make sure it is accurate compared to his fingersticks, he will need to continue to monitor at various times of the day and bring his meter for download   Patient Instructions  Breakfast 2 clicks Lunch 4 at lunch and 5 at dinner  Call if low sugar more often  Bedtime snack with some carbs   Influenza vaccine given   [facility] 04/13/2017, 8:32 PM   Note: This office note was prepared with Dragon voice recognition system technology. Any transcriptional errors that result from this process are unintentional.  Counseling time on subjects discussed in assessment and plan sections is over 50% of today's 25 minute visit

## 2017-04-13 NOTE — Patient Instructions (Signed)
Scan the reader at least every 8 hours. I suggest before and after meals and bedtime. You can discuss with dr. Dwyane Dee.   Remove the sensor and place a new one in a different spot in 10 days- next Friday. The reader will let you know when to change it.   You can enter in your food, activity and insulin information in to the reader.   Feel free to call and I am happy to help you put on a new sensor next Friday if you would like.   Butch Penny [phone]

## 2017-04-14 DIAGNOSIS — Z89422 Acquired absence of other left toe(s): Secondary | ICD-10-CM | POA: Diagnosis not present

## 2017-04-14 DIAGNOSIS — E11621 Type 2 diabetes mellitus with foot ulcer: Secondary | ICD-10-CM | POA: Diagnosis not present

## 2017-04-14 DIAGNOSIS — E1142 Type 2 diabetes mellitus with diabetic polyneuropathy: Secondary | ICD-10-CM | POA: Diagnosis not present

## 2017-04-14 DIAGNOSIS — Z7901 Long term (current) use of anticoagulants: Secondary | ICD-10-CM | POA: Diagnosis not present

## 2017-04-14 DIAGNOSIS — N183 Chronic kidney disease, stage 3 (moderate): Secondary | ICD-10-CM | POA: Diagnosis not present

## 2017-04-14 DIAGNOSIS — E1122 Type 2 diabetes mellitus with diabetic chronic kidney disease: Secondary | ICD-10-CM | POA: Diagnosis not present

## 2017-04-14 DIAGNOSIS — I129 Hypertensive chronic kidney disease with stage 1 through stage 4 chronic kidney disease, or unspecified chronic kidney disease: Secondary | ICD-10-CM | POA: Diagnosis not present

## 2017-04-14 DIAGNOSIS — L97522 Non-pressure chronic ulcer of other part of left foot with fat layer exposed: Secondary | ICD-10-CM | POA: Diagnosis not present

## 2017-04-14 DIAGNOSIS — Z794 Long term (current) use of insulin: Secondary | ICD-10-CM | POA: Diagnosis not present

## 2017-04-14 DIAGNOSIS — Z4800 Encounter for change or removal of nonsurgical wound dressing: Secondary | ICD-10-CM | POA: Diagnosis not present

## 2017-04-14 DIAGNOSIS — E11311 Type 2 diabetes mellitus with unspecified diabetic retinopathy with macular edema: Secondary | ICD-10-CM | POA: Diagnosis not present

## 2017-04-15 DIAGNOSIS — E113311 Type 2 diabetes mellitus with moderate nonproliferative diabetic retinopathy with macular edema, right eye: Secondary | ICD-10-CM | POA: Diagnosis not present

## 2017-04-16 ENCOUNTER — Telehealth: Payer: Self-pay | Admitting: Dietician

## 2017-04-16 DIAGNOSIS — E11621 Type 2 diabetes mellitus with foot ulcer: Secondary | ICD-10-CM | POA: Diagnosis not present

## 2017-04-16 DIAGNOSIS — Z794 Long term (current) use of insulin: Secondary | ICD-10-CM | POA: Diagnosis not present

## 2017-04-16 DIAGNOSIS — E1122 Type 2 diabetes mellitus with diabetic chronic kidney disease: Secondary | ICD-10-CM | POA: Diagnosis not present

## 2017-04-16 DIAGNOSIS — E11311 Type 2 diabetes mellitus with unspecified diabetic retinopathy with macular edema: Secondary | ICD-10-CM | POA: Diagnosis not present

## 2017-04-16 DIAGNOSIS — Z4800 Encounter for change or removal of nonsurgical wound dressing: Secondary | ICD-10-CM | POA: Diagnosis not present

## 2017-04-16 DIAGNOSIS — L97522 Non-pressure chronic ulcer of other part of left foot with fat layer exposed: Secondary | ICD-10-CM | POA: Diagnosis not present

## 2017-04-16 DIAGNOSIS — Z7901 Long term (current) use of anticoagulants: Secondary | ICD-10-CM | POA: Diagnosis not present

## 2017-04-16 DIAGNOSIS — I129 Hypertensive chronic kidney disease with stage 1 through stage 4 chronic kidney disease, or unspecified chronic kidney disease: Secondary | ICD-10-CM | POA: Diagnosis not present

## 2017-04-16 DIAGNOSIS — Z89422 Acquired absence of other left toe(s): Secondary | ICD-10-CM | POA: Diagnosis not present

## 2017-04-16 DIAGNOSIS — N183 Chronic kidney disease, stage 3 (moderate): Secondary | ICD-10-CM | POA: Diagnosis not present

## 2017-04-16 DIAGNOSIS — G4733 Obstructive sleep apnea (adult) (pediatric): Secondary | ICD-10-CM | POA: Diagnosis not present

## 2017-04-16 DIAGNOSIS — E1142 Type 2 diabetes mellitus with diabetic polyneuropathy: Secondary | ICD-10-CM | POA: Diagnosis not present

## 2017-04-16 NOTE — Telephone Encounter (Signed)
Megan: Please tell him to cut back his DINNER bolus clicks to 4 instead of 5 especially if his blood sugars after evening meal are running below 120.  Also will need to have a snack with protein at bedtime daily.  He will need to call us if he continues to have low blood sugars waking up

## 2017-04-16 NOTE — Telephone Encounter (Signed)
Patient called to ask if he still has clicks left in his Vgo from yesterday, does he still have to change it every 24 hours. I informed him that he should change it about the same time each day even if he has clicks left in it.   He also said that his meter and CGM showed his blood sugar to be 43 and 61 respectively fasting this am. He ate a sausage & egg sandwich and planned take the 2 clicks later.  His blood sugar is now 80 mg/dl, so he decided to omit the clicks for breakfast. Concurred with his plan and encouraged him to check his blood sugar more often  as he could be more likely to have another low blood sugar in the next 24 hours. He verbalized understanding.

## 2017-04-16 NOTE — Telephone Encounter (Signed)
Called patient and went over note for change in clicks at dinner and patient stated he understood. I advised for him to continue to change his VGo at the same time daily and he agreed. He will monitor his blood sugars over the weekend and stated he will call us next week if he is still continuing to have low readings. He also stated that he will each crackers with peanut butter for his bedtime snack daily.

## 2017-04-19 DIAGNOSIS — E1122 Type 2 diabetes mellitus with diabetic chronic kidney disease: Secondary | ICD-10-CM | POA: Diagnosis not present

## 2017-04-19 DIAGNOSIS — E11621 Type 2 diabetes mellitus with foot ulcer: Secondary | ICD-10-CM | POA: Diagnosis not present

## 2017-04-19 DIAGNOSIS — Z89422 Acquired absence of other left toe(s): Secondary | ICD-10-CM | POA: Diagnosis not present

## 2017-04-19 DIAGNOSIS — I129 Hypertensive chronic kidney disease with stage 1 through stage 4 chronic kidney disease, or unspecified chronic kidney disease: Secondary | ICD-10-CM | POA: Diagnosis not present

## 2017-04-19 DIAGNOSIS — Z794 Long term (current) use of insulin: Secondary | ICD-10-CM | POA: Diagnosis not present

## 2017-04-19 DIAGNOSIS — E11311 Type 2 diabetes mellitus with unspecified diabetic retinopathy with macular edema: Secondary | ICD-10-CM | POA: Diagnosis not present

## 2017-04-19 DIAGNOSIS — Z7901 Long term (current) use of anticoagulants: Secondary | ICD-10-CM | POA: Diagnosis not present

## 2017-04-19 DIAGNOSIS — N183 Chronic kidney disease, stage 3 (moderate): Secondary | ICD-10-CM | POA: Diagnosis not present

## 2017-04-19 DIAGNOSIS — E1142 Type 2 diabetes mellitus with diabetic polyneuropathy: Secondary | ICD-10-CM | POA: Diagnosis not present

## 2017-04-19 DIAGNOSIS — L97522 Non-pressure chronic ulcer of other part of left foot with fat layer exposed: Secondary | ICD-10-CM | POA: Diagnosis not present

## 2017-04-19 DIAGNOSIS — Z4800 Encounter for change or removal of nonsurgical wound dressing: Secondary | ICD-10-CM | POA: Diagnosis not present

## 2017-04-20 ENCOUNTER — Encounter: Payer: Self-pay | Admitting: Internal Medicine

## 2017-04-20 ENCOUNTER — Telehealth: Payer: Self-pay | Admitting: Endocrinology

## 2017-04-20 ENCOUNTER — Encounter (INDEPENDENT_AMBULATORY_CARE_PROVIDER_SITE_OTHER): Payer: Self-pay

## 2017-04-20 ENCOUNTER — Ambulatory Visit (INDEPENDENT_AMBULATORY_CARE_PROVIDER_SITE_OTHER): Payer: Medicare Other | Admitting: Internal Medicine

## 2017-04-20 ENCOUNTER — Encounter: Payer: Self-pay | Admitting: *Deleted

## 2017-04-20 VITALS — BP 133/67 | HR 68 | Temp 98.1°F | Ht 77.4 in | Wt >= 6400 oz

## 2017-04-20 DIAGNOSIS — E1122 Type 2 diabetes mellitus with diabetic chronic kidney disease: Secondary | ICD-10-CM | POA: Diagnosis not present

## 2017-04-20 DIAGNOSIS — D649 Anemia, unspecified: Secondary | ICD-10-CM

## 2017-04-20 DIAGNOSIS — Z23 Encounter for immunization: Secondary | ICD-10-CM | POA: Diagnosis not present

## 2017-04-20 DIAGNOSIS — D631 Anemia in chronic kidney disease: Secondary | ICD-10-CM

## 2017-04-20 DIAGNOSIS — Z6841 Body Mass Index (BMI) 40.0 and over, adult: Secondary | ICD-10-CM

## 2017-04-20 DIAGNOSIS — E11649 Type 2 diabetes mellitus with hypoglycemia without coma: Secondary | ICD-10-CM

## 2017-04-20 DIAGNOSIS — Z9641 Presence of insulin pump (external) (internal): Secondary | ICD-10-CM | POA: Diagnosis not present

## 2017-04-20 DIAGNOSIS — I10 Essential (primary) hypertension: Secondary | ICD-10-CM | POA: Diagnosis not present

## 2017-04-20 DIAGNOSIS — G8929 Other chronic pain: Secondary | ICD-10-CM | POA: Diagnosis not present

## 2017-04-20 DIAGNOSIS — E11621 Type 2 diabetes mellitus with foot ulcer: Secondary | ICD-10-CM | POA: Diagnosis not present

## 2017-04-20 DIAGNOSIS — E559 Vitamin D deficiency, unspecified: Secondary | ICD-10-CM | POA: Diagnosis not present

## 2017-04-20 DIAGNOSIS — N183 Chronic kidney disease, stage 3 unspecified: Secondary | ICD-10-CM

## 2017-04-20 DIAGNOSIS — I129 Hypertensive chronic kidney disease with stage 1 through stage 4 chronic kidney disease, or unspecified chronic kidney disease: Secondary | ICD-10-CM | POA: Diagnosis not present

## 2017-04-20 DIAGNOSIS — I480 Paroxysmal atrial fibrillation: Secondary | ICD-10-CM | POA: Diagnosis not present

## 2017-04-20 DIAGNOSIS — L97529 Non-pressure chronic ulcer of other part of left foot with unspecified severity: Secondary | ICD-10-CM | POA: Diagnosis not present

## 2017-04-20 DIAGNOSIS — G894 Chronic pain syndrome: Secondary | ICD-10-CM

## 2017-04-20 DIAGNOSIS — Z7901 Long term (current) use of anticoagulants: Secondary | ICD-10-CM

## 2017-04-20 DIAGNOSIS — Z0000 Encounter for general adult medical examination without abnormal findings: Secondary | ICD-10-CM

## 2017-04-20 DIAGNOSIS — Z794 Long term (current) use of insulin: Secondary | ICD-10-CM

## 2017-04-20 DIAGNOSIS — Z79899 Other long term (current) drug therapy: Secondary | ICD-10-CM

## 2017-04-20 LAB — GLUCOSE, CAPILLARY: GLUCOSE-CAPILLARY: 81 mg/dL (ref 65–99)

## 2017-04-20 NOTE — Telephone Encounter (Signed)
He needs to take off his pump at bedtime Also please note what his blood sugars are at bedtime. If his blood sugars start going up over 140 in the morning without the pump he will need to let us know

## 2017-04-20 NOTE — Patient Instructions (Signed)
-   It was a pleasure seeing you today. - Your blood sugars have still been running low. We will fax the results to Dr. Ronnie Derby office - Your BP is well controlled. Keep up the good work! - Please try and exercise and watch your diet - We will check some blood work today - Please follow up with your podiatrist for your foot ulcer

## 2017-04-20 NOTE — Telephone Encounter (Signed)
Called patient and he stated that he is having lows in the evenings and during the middle of the night and he stated that they are running between 50's and 80's. He just went to see his PCP Dr. Mearl Latin at [facility] and he is going to fax over blood sugar readings from his meter.

## 2017-04-20 NOTE — Telephone Encounter (Signed)
Pt is still having low BS with the vgo

## 2017-04-21 ENCOUNTER — Telehealth: Payer: Self-pay | Admitting: Dietician

## 2017-04-21 DIAGNOSIS — L97522 Non-pressure chronic ulcer of other part of left foot with fat layer exposed: Secondary | ICD-10-CM | POA: Diagnosis not present

## 2017-04-21 DIAGNOSIS — N183 Chronic kidney disease, stage 3 (moderate): Secondary | ICD-10-CM | POA: Diagnosis not present

## 2017-04-21 DIAGNOSIS — Z89422 Acquired absence of other left toe(s): Secondary | ICD-10-CM | POA: Diagnosis not present

## 2017-04-21 DIAGNOSIS — Z4800 Encounter for change or removal of nonsurgical wound dressing: Secondary | ICD-10-CM | POA: Diagnosis not present

## 2017-04-21 DIAGNOSIS — E11311 Type 2 diabetes mellitus with unspecified diabetic retinopathy with macular edema: Secondary | ICD-10-CM | POA: Diagnosis not present

## 2017-04-21 DIAGNOSIS — Z7901 Long term (current) use of anticoagulants: Secondary | ICD-10-CM | POA: Diagnosis not present

## 2017-04-21 DIAGNOSIS — E1122 Type 2 diabetes mellitus with diabetic chronic kidney disease: Secondary | ICD-10-CM | POA: Diagnosis not present

## 2017-04-21 DIAGNOSIS — Z794 Long term (current) use of insulin: Secondary | ICD-10-CM | POA: Diagnosis not present

## 2017-04-21 DIAGNOSIS — E1142 Type 2 diabetes mellitus with diabetic polyneuropathy: Secondary | ICD-10-CM | POA: Diagnosis not present

## 2017-04-21 DIAGNOSIS — I129 Hypertensive chronic kidney disease with stage 1 through stage 4 chronic kidney disease, or unspecified chronic kidney disease: Secondary | ICD-10-CM | POA: Diagnosis not present

## 2017-04-21 DIAGNOSIS — E11621 Type 2 diabetes mellitus with foot ulcer: Secondary | ICD-10-CM | POA: Diagnosis not present

## 2017-04-21 LAB — BMP8+ANION GAP
Anion Gap: 14 mmol/L (ref 10.0–18.0)
BUN / CREAT RATIO: 12 (ref 10–24)
BUN: 22 mg/dL (ref 8–27)
CO2: 22 mmol/L (ref 20–29)
CREATININE: 1.8 mg/dL — AB (ref 0.76–1.27)
Calcium: 8.9 mg/dL (ref 8.6–10.2)
Chloride: 106 mmol/L (ref 96–106)
GFR calc non Af Amer: 38 mL/min/{1.73_m2} — ABNORMAL LOW (ref >59)
GFR, EST AFRICAN AMERICAN: 44 mL/min/{1.73_m2} — AB (ref >59)
Glucose: 77 mg/dL (ref 65–99)
Potassium: 4.5 mmol/L (ref 3.5–5.2)
SODIUM: 142 mmol/L (ref 134–144)

## 2017-04-21 LAB — CBC
Hematocrit: 36.5 % — ABNORMAL LOW (ref 37.5–51.0)
Hemoglobin: 11.3 g/dL — ABNORMAL LOW (ref 13.0–17.7)
MCH: 27.6 pg (ref 26.6–33.0)
MCHC: 31 g/dL — AB (ref 31.5–35.7)
MCV: 89 fL (ref 79–97)
PLATELETS: 160 10*3/uL (ref 150–379)
RBC: 4.09 x10E6/uL — ABNORMAL LOW (ref 4.14–5.80)
RDW: 15 % (ref 12.3–15.4)
WBC: 4.7 10*3/uL (ref 3.4–10.8)

## 2017-04-21 NOTE — Assessment & Plan Note (Signed)
BP Readings from Last 3 Encounters:  04/20/17 133/67  04/13/17 120/70  03/30/17 (!) 152/82    Lab Results  Component Value Date   NA 142 04/20/2017   K 4.5 04/20/2017   CREATININE 1.80 (H) 04/20/2017    Assessment: Blood pressure control:  well controlled Progress toward BP goal:   at goal Comments: patient is compliant with metoprolol, lasix 40 mg, enalapril 20 mg  Plan: Medications:  continue current medications Educational resources provided: brochure (denies need ) Self management tools provided:   Other plans: Will check BMP

## 2017-04-21 NOTE — Assessment & Plan Note (Signed)
-   Patient received a flu vaccine already - Will give 23 valent PNA vaccine today

## 2017-04-21 NOTE — Telephone Encounter (Signed)
Mr. Pastorino calls to request his Freestyle Elenor Legato download be faxed/sent to Dr. Ronnie Derby office. He also wants Dr. Molli Posey to know that Dr. Dwyane Dee told him to remove his VGo at bedtime and put a new one on in the morning because of his low blood sugars. He  Did this  last night and today his fasting blood sugar is 118. Report sent via email.

## 2017-04-21 NOTE — Assessment & Plan Note (Signed)
-   This problem is chronic and stable - Patient is complying with oral Vitamin D  - Vitamin D level in July was borderline low normal - Will continue with Vitamin D for now

## 2017-04-21 NOTE — Assessment & Plan Note (Signed)
-   Patient continues to follow with Dr. Mirna Mires for his chronic pain  - His pain is moderately well controlled on his current regimen - He obtains all his pain medications from there. He will continue to follow there for now

## 2017-04-21 NOTE — Assessment & Plan Note (Signed)
-   Patient follows with Dr. Dwyane Dee for his diabetes - He has switched to ViGo pump and now uses continuous blood glucose monitoring - He has been having recurrent low blood sugars to the 40s and 50s despite reducing his insulin dose after speaking with Dr. Dwyane Dee - We will fax the results of his CBG monitor to Dr.Kumar and patient will call Dr. Dwyane Dee and discuss what to do with his insulin

## 2017-04-21 NOTE — Telephone Encounter (Signed)
Called patient and let him know to remove his pump at night and to check his blood sugars at bedtime and first thing in the morning and keep a record of them so he can call us and tell us. Patient is going to have the doctor fax his blood sugars to [place] again.

## 2017-04-21 NOTE — Telephone Encounter (Signed)
Thank you [name] 

## 2017-04-21 NOTE — Assessment & Plan Note (Signed)
-   This problem is chronic and stable - Patient denies palpitations. No lightheadedness/ syncope - Will continue with metoprolol and eliquis

## 2017-04-21 NOTE — Progress Notes (Signed)
   Subjective:    Patient ID: [name], male    DOB: 1950/08/23, 66 y.o.   MRN: [id number]  HPI  I have seen and examined this patient. He is here for routine follow up of his DM and HTN.  Patient does note that he has had more low blood sugars over the past week. He spoke with Debera Lat and Dr. Dwyane Dee and reduced the amount of insulin he is supposed to take. He is still experiencing low blood sugars on CBG monitoring with lows in the 40s in the 50s.   He has no other complaints at this time and is compliant at this time.    Review of Systems  Constitutional: Negative.   HENT: Negative.   Respiratory: Negative.   Cardiovascular: Negative.   Gastrointestinal: Negative.   Musculoskeletal: Negative.   Skin: Negative.   Neurological: Negative.   Psychiatric/Behavioral: Negative.        Objective:   Physical Exam  Constitutional: He is oriented to person, place, and time. He appears well-developed and well-nourished.  HENT:  Head: Normocephalic and atraumatic.  Neck: Normal range of motion. Neck supple.  Cardiovascular: Normal rate, regular rhythm and normal heart sounds.   Pulmonary/Chest: Effort normal and breath sounds normal. No respiratory distress. He has no wheezes.  Abdominal: Soft. Bowel sounds are normal. He exhibits no distension. There is no tenderness.  Musculoskeletal: Normal range of motion. He exhibits edema. He exhibits no tenderness.  Patient with dressing intact over left foot Patient with 1 + L LE edema and trace R LE  Lymphadenopathy:    He has no cervical adenopathy.  Neurological: He is alert and oriented to person, place, and time.  Skin: Skin is warm. No erythema.  Psychiatric: He has a normal mood and affect. His behavior is normal.          Assessment & Plan:   Please see problem based charting for assessment and plan:

## 2017-04-21 NOTE — Assessment & Plan Note (Signed)
-   Patient has a history of chronic anemia likely secondary to chronic disease - His CBC was checked and his Hg remains stable at 11.3 - No further work up for now - Will continue to monitor

## 2017-04-21 NOTE — Assessment & Plan Note (Signed)
-   Discussed with patient the importance of weight loss  - He is finding it difficult to exercise as he is supposed to stay off his foot while it is healing - I emphasized the importance of diet and set a goal of 5 lbs weight loss over the next 3 months

## 2017-04-21 NOTE — Assessment & Plan Note (Signed)
-   This problem is chronic and stable - Checked repeat BMP today - Creatinine stable at 1.8 - Will continue to monitor

## 2017-04-21 NOTE — Assessment & Plan Note (Signed)
-   Patient with chronic left foot ulcer for which he has been following with podiatry - He states the ulcer is slowly healing and is much smaller than it used to be - He will continue to follow with podiatry

## 2017-04-22 ENCOUNTER — Telehealth: Payer: Self-pay | Admitting: Internal Medicine

## 2017-04-22 NOTE — Telephone Encounter (Signed)
I called the patient to discuss the results of his bloodwork. Patient's hemoglobin is at his baseline. His anemia is likely secondary to anemia of chronic disease (likely CKD). His creatinine is at baseline as well at 1.8. We will continue to monitor these going forward. Patient states that his blood sugars in the morning are now in the 200s. He states that he was having low blood sugars before and was instructed by Dr. Dwyane Dee to take his Vi-go pump off at night and put it on in the morning. He will discuss his higher blood sugars with Dr. Dwyane Dee over the phone. Patient expresses understanding and is in agreement with plan. Patient states he will call us with any questions.

## 2017-04-23 ENCOUNTER — Telehealth: Payer: Self-pay | Admitting: Endocrinology

## 2017-04-23 DIAGNOSIS — I129 Hypertensive chronic kidney disease with stage 1 through stage 4 chronic kidney disease, or unspecified chronic kidney disease: Secondary | ICD-10-CM | POA: Diagnosis not present

## 2017-04-23 DIAGNOSIS — Z794 Long term (current) use of insulin: Secondary | ICD-10-CM | POA: Diagnosis not present

## 2017-04-23 DIAGNOSIS — E1122 Type 2 diabetes mellitus with diabetic chronic kidney disease: Secondary | ICD-10-CM | POA: Diagnosis not present

## 2017-04-23 DIAGNOSIS — N183 Chronic kidney disease, stage 3 (moderate): Secondary | ICD-10-CM | POA: Diagnosis not present

## 2017-04-23 DIAGNOSIS — E1142 Type 2 diabetes mellitus with diabetic polyneuropathy: Secondary | ICD-10-CM | POA: Diagnosis not present

## 2017-04-23 DIAGNOSIS — L97522 Non-pressure chronic ulcer of other part of left foot with fat layer exposed: Secondary | ICD-10-CM | POA: Diagnosis not present

## 2017-04-23 DIAGNOSIS — Z89422 Acquired absence of other left toe(s): Secondary | ICD-10-CM | POA: Diagnosis not present

## 2017-04-23 DIAGNOSIS — Z4800 Encounter for change or removal of nonsurgical wound dressing: Secondary | ICD-10-CM | POA: Diagnosis not present

## 2017-04-23 DIAGNOSIS — E11621 Type 2 diabetes mellitus with foot ulcer: Secondary | ICD-10-CM | POA: Diagnosis not present

## 2017-04-23 DIAGNOSIS — E11311 Type 2 diabetes mellitus with unspecified diabetic retinopathy with macular edema: Secondary | ICD-10-CM | POA: Diagnosis not present

## 2017-04-23 DIAGNOSIS — Z7901 Long term (current) use of anticoagulants: Secondary | ICD-10-CM | POA: Diagnosis not present

## 2017-04-23 NOTE — Telephone Encounter (Signed)
Pt is having bs concerns and would like to discuss further with the nurse.

## 2017-04-23 NOTE — Telephone Encounter (Signed)
Called patient and left a voice message to let him know that I will try to call him back before I leave today.

## 2017-04-23 NOTE — Telephone Encounter (Signed)
Called patient and he stated that he is doing his clicks for all of his meals. He will do the insulin injection if his blood sugars are over 150. He will keep a record of his blood sugar readings and times and will let us know how things are going for him next week.

## 2017-04-23 NOTE — Telephone Encounter (Signed)
No changes yet but if his morning sugars continue to stay over 150 he will take an injection with a syringe of 4 units of the Humulin R U-500 at bedtime when he removes his pump.  Also make sure he is doing the clicks for all his meals regardless of the blood sugar before eating

## 2017-04-23 NOTE — Telephone Encounter (Signed)
Called patient and he stated that this is his blood sugar readings:  On 04/21/17 8:40am was 118 11:40am was 325 1:40pm was 169 3:30pm was 102  04/22/2017 8:11am was 222 1pm was 144 4pm was 63 5:48 was 120 8:10 was 166  04/23/2017 7:30am was 162 12:35pm was 178   Patient stated that he has removed his pump at night also.  Please advise.

## 2017-04-23 NOTE — Telephone Encounter (Signed)
Patient is returning your call, he asked you to please cal back.

## 2017-04-26 DIAGNOSIS — E11311 Type 2 diabetes mellitus with unspecified diabetic retinopathy with macular edema: Secondary | ICD-10-CM | POA: Diagnosis not present

## 2017-04-26 DIAGNOSIS — Z89422 Acquired absence of other left toe(s): Secondary | ICD-10-CM | POA: Diagnosis not present

## 2017-04-26 DIAGNOSIS — N183 Chronic kidney disease, stage 3 (moderate): Secondary | ICD-10-CM | POA: Diagnosis not present

## 2017-04-26 DIAGNOSIS — I129 Hypertensive chronic kidney disease with stage 1 through stage 4 chronic kidney disease, or unspecified chronic kidney disease: Secondary | ICD-10-CM | POA: Diagnosis not present

## 2017-04-26 DIAGNOSIS — Z4800 Encounter for change or removal of nonsurgical wound dressing: Secondary | ICD-10-CM | POA: Diagnosis not present

## 2017-04-26 DIAGNOSIS — Z794 Long term (current) use of insulin: Secondary | ICD-10-CM | POA: Diagnosis not present

## 2017-04-26 DIAGNOSIS — E11621 Type 2 diabetes mellitus with foot ulcer: Secondary | ICD-10-CM | POA: Diagnosis not present

## 2017-04-26 DIAGNOSIS — E1122 Type 2 diabetes mellitus with diabetic chronic kidney disease: Secondary | ICD-10-CM | POA: Diagnosis not present

## 2017-04-26 DIAGNOSIS — E1142 Type 2 diabetes mellitus with diabetic polyneuropathy: Secondary | ICD-10-CM | POA: Diagnosis not present

## 2017-04-26 DIAGNOSIS — Z7901 Long term (current) use of anticoagulants: Secondary | ICD-10-CM | POA: Diagnosis not present

## 2017-04-26 DIAGNOSIS — L97522 Non-pressure chronic ulcer of other part of left foot with fat layer exposed: Secondary | ICD-10-CM | POA: Diagnosis not present

## 2017-04-27 ENCOUNTER — Ambulatory Visit (INDEPENDENT_AMBULATORY_CARE_PROVIDER_SITE_OTHER): Payer: Medicare Other | Admitting: Sports Medicine

## 2017-04-27 ENCOUNTER — Encounter: Payer: Self-pay | Admitting: Sports Medicine

## 2017-04-27 VITALS — BP 140/70 | HR 72 | Resp 16

## 2017-04-27 DIAGNOSIS — Z89432 Acquired absence of left foot: Secondary | ICD-10-CM | POA: Diagnosis not present

## 2017-04-27 DIAGNOSIS — I739 Peripheral vascular disease, unspecified: Secondary | ICD-10-CM | POA: Diagnosis not present

## 2017-04-27 DIAGNOSIS — L97522 Non-pressure chronic ulcer of other part of left foot with fat layer exposed: Secondary | ICD-10-CM

## 2017-04-27 DIAGNOSIS — IMO0002 Reserved for concepts with insufficient information to code with codable children: Secondary | ICD-10-CM

## 2017-04-27 DIAGNOSIS — E0842 Diabetes mellitus due to underlying condition with diabetic polyneuropathy: Secondary | ICD-10-CM

## 2017-04-27 NOTE — Progress Notes (Signed)
Subjective: [name] is a 66 y.o. male patient seen today in office for POV #30 (DOS 06-15-16), S/P Left wound debridement and placement of stravix allograft. Patient is using regranex with improvement, denies pain at surgical site, states that when he does have pain sometimes 3-4/10, unchanged from prior; denies calf pain, denies headache, chest pain, shortness of breath, nausea, vomiting, fever, or chills. Patient states that he is doing well with blood sugars on pump. No other issues noted.   Patient Active Problem List   Diagnosis Date Noted  . Chronic pain syndrome 09/18/2015  . Preventative health care 03/12/2015  . Foot ulcer, left ([place]) 12/13/2014  . Paroxysmal atrial fibrillation ([place]) 12/13/2014  . Elevated alkaline phosphatase level 06/11/2014  . Constipation 05/31/2014  . Vitamin D deficiency 11/17/2013  . Anemia 02/10/2012  . Hypertension 11/18/2011  . Obstructive sleep apnea 12/12/2009  . GERD 06/27/2008  . Diabetes type 2, uncontrolled ([place]) 12/28/2007  . DIABETIC MACULAR EDEMA 10/07/2007  . Dyslipidemia 08/15/2007  . BACKGROUND DIABETIC RETINOPATHY 08/15/2007  . HEARING LOSS, SENSORINEURAL, BILATERAL 01/03/2007  . Diabetic peripheral neuropathy associated with type 2 diabetes mellitus ([place]) 12/16/2006  . Chronic kidney disease, stage III (moderate) ([place]) 12/01/2006  . Morbid obesity ([place]) 08/23/2006  . STATUS, OTHER TOE(S) AMPUTATION 05/21/2006    Current Outpatient Prescriptions on File Prior to Visit  Medication Sig Dispense Refill  . ACCU-CHEK FASTCLIX LANCETS MISC Check blood sugar 5 times a day 408 each 3  . atorvastatin (LIPITOR) 20 MG tablet TAKE 1 TABLET(20 MG) BY MOUTH DAILY 90 tablet 0  . becaplermin (REGRANEX) 0.01 % gel Apply 1 application topically daily. 15 g 5  . Blood Glucose Monitoring Suppl (ACCU-CHEK AVIVA PLUS) w/Device KIT CHECK BLOOD SUGAR 5 TIMES A DAY 1 kit 0  . Cholecalciferol (VITAMIN D3) 2000 UNITS capsule Take 1 capsule  (2,000 Units total) by mouth daily. 90 capsule 0  . diclofenac sodium (VOLTAREN) 1 % GEL Apply 4 g topically 4 (four) times daily. 100 g 2  . docusate sodium (COLACE) 100 MG capsule Take 1 capsule (100 mg total) by mouth daily as needed for mild constipation. 14 capsule 0  . ELIQUIS 5 MG TABS tablet TAKE 1 TABLET(5 MG) BY MOUTH TWICE DAILY 180 tablet 3  . enalapril (VASOTEC) 20 MG tablet TAKE 2 TABLETS(40 MG) BY MOUTH DAILY 180 tablet 1  . furosemide (LASIX) 40 MG tablet Take 1 tablet (40 mg total) by mouth 2 (two) times daily. 180 tablet 1  . gabapentin (NEURONTIN) 300 MG capsule TAKE 1 CAPSULE(300 MG) BY MOUTH AT BEDTIME 90 capsule 3  . glucose blood (ACCU-CHEK AVIVA PLUS) test strip USE TO CHECK BLOOD Five TIMES DAILY 450 each 3  . HUMULIN R 500 UNIT/ML injection DRAW INSULIN TO THE 68 UNIT LINE ON THE U-100 SYRINGE(TO USE 340 UNITS) PER DAY AS DIRECTED 20 mL 0  . Insulin Disposable Pump (V-GO 20) KIT Use 1 pimp daily with insulin 30 kit 4  . Insulin Pen Needle 31G X 5 MM MISC Use for insulin injection 5 times a day. 150 each 5  . insulin regular human CONCENTRATED (HUMULIN R U-500 KWIKPEN) 500 UNIT/ML kwikpen INJECT 35 UNITS AT BREAKFAST, 60 UNITS AT LUNCH, AND 70 UNITS AT DINNER (Patient taking differently: INJECT 35 UNITS AT BREAKFAST, 60 UNITS AT LUNCH, AND 70 UNITS AT DINNER; pt stated, "Have a pump and uses syringes"; dosage was changed to 20 units at breakfast, 40 units at lunch, 40 units at  dinner) 20 mL 2  . Insulin Syringe-Needle U-100 25G X 1" 1 ML MISC Use 1 syringe daily to fill V-Go pump 30 each 3  . metoprolol succinate (TOPROL-XL) 25 MG 24 hr tablet TAKE 1 TABLET(25 MG) BY MOUTH DAILY 90 tablet 2  . omeprazole (PRILOSEC) 20 MG capsule TAKE 1 CAPSULE(20 MG) BY MOUTH DAILY 90 capsule 1  . PROAIR HFA 108 (90 Base) MCG/ACT inhaler INHALE 2 PUFFS INTO THE LUNGS BY MOUTH EVERY 6 HOURS AS NEEDED FOR WHEEZING OR SHORTNESS OF BREATH 8.5 g 6  . promethazine (PHENERGAN) 25 MG tablet Take 1  tablet (25 mg total) by mouth every 8 (eight) hours as needed for nausea or vomiting. 30 tablet 0  . silver sulfADIAZINE (SILVADENE) 1 % cream Apply 1 application topically daily. 50 g 1  . SIMBRINZA 1-0.2 % SUSP Place 1 drop into both eyes 2 (two) times daily. Take as instructed by Dr. Katy Fitch.    . TOUJEO SOLOSTAR 300 UNIT/ML SOPN INJECT 60 UNITS INTO THE SKIN EVERY MORNING AND 90 UNITS INTO THE SKIN AT NIGHT 9 pen 0  . VICTOZA 18 MG/3ML SOPN Inject 0.3 mLs (1.8 mg total) into the skin daily. 9 mL 1  . HYDROcodone-acetaminophen (NORCO) 7.5-325 MG tablet Take 1 tablet by mouth every 6 (six) hours as needed for moderate pain. 130 tablet 0   No current facility-administered medications on file prior to visit.     Allergies  Allergen Reactions  . Vancomycin     REACTION: ARF    Objective: There were no vitals filed for this visit.  General: No acute distress, AAOx3  Left foot: Plantar forefoot, wound limited to skin, wound bed measuring 1.0x0.6x0.2cm (similar as previous) with mild periwound keratosis, no swelling to left foot, no erythema, no warmth, no active drainage however on inner guaze there is drainage bloody drainage with no acute signs of infection noted, Capillary fill time <3 seconds in all digits remaining, amputation status of 2nd and 3rd toes. No pain with calf compression.   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[place])    -  Primary   Diabetes mellitus due to underlying condition with diabetic polyneuropathy, unspecified whether long term insulin use ([place])       Foot amputation status, left ([facility])       PVD (peripheral vascular disease) ([place])           -Patient seen and evaluated -Cleansed wound debirded wound margins excisionlly to healthy bleeding margins through the level of dermis and applied antibiotic cream and offloading padding without complication, post debridement  measurements as above -Advised patient to make sure to keep dressings clean, dry, and intact allowing nursing to change using regranex and offloading padding -Advised patient to continue with forefoot offloading post-op shoe on left foot with use of cane -Advised patient to limit activity to necessity  -Advised patient to rest and elevate as necessary -Patient to return for wound check in 2-3 weeks. In the meantime, patient to call office if any issues or problems arise. MAY CONSIDER EPIFIX IF CONTINUES TO REMAIN THE SAME SIZE AT NEXT VISIT.   Landis Martins, DPM

## 2017-04-28 DIAGNOSIS — E11311 Type 2 diabetes mellitus with unspecified diabetic retinopathy with macular edema: Secondary | ICD-10-CM | POA: Diagnosis not present

## 2017-04-28 DIAGNOSIS — I129 Hypertensive chronic kidney disease with stage 1 through stage 4 chronic kidney disease, or unspecified chronic kidney disease: Secondary | ICD-10-CM | POA: Diagnosis not present

## 2017-04-28 DIAGNOSIS — M79672 Pain in left foot: Secondary | ICD-10-CM | POA: Diagnosis not present

## 2017-04-28 DIAGNOSIS — E1142 Type 2 diabetes mellitus with diabetic polyneuropathy: Secondary | ICD-10-CM | POA: Diagnosis not present

## 2017-04-28 DIAGNOSIS — M545 Low back pain: Secondary | ICD-10-CM | POA: Diagnosis not present

## 2017-04-28 DIAGNOSIS — Z79891 Long term (current) use of opiate analgesic: Secondary | ICD-10-CM | POA: Diagnosis not present

## 2017-04-28 DIAGNOSIS — L97522 Non-pressure chronic ulcer of other part of left foot with fat layer exposed: Secondary | ICD-10-CM | POA: Diagnosis not present

## 2017-04-28 DIAGNOSIS — E11621 Type 2 diabetes mellitus with foot ulcer: Secondary | ICD-10-CM | POA: Diagnosis not present

## 2017-04-28 DIAGNOSIS — Z89422 Acquired absence of other left toe(s): Secondary | ICD-10-CM | POA: Diagnosis not present

## 2017-04-28 DIAGNOSIS — Z4800 Encounter for change or removal of nonsurgical wound dressing: Secondary | ICD-10-CM | POA: Diagnosis not present

## 2017-04-28 DIAGNOSIS — E1122 Type 2 diabetes mellitus with diabetic chronic kidney disease: Secondary | ICD-10-CM | POA: Diagnosis not present

## 2017-04-28 DIAGNOSIS — M25569 Pain in unspecified knee: Secondary | ICD-10-CM | POA: Diagnosis not present

## 2017-04-28 DIAGNOSIS — G894 Chronic pain syndrome: Secondary | ICD-10-CM | POA: Diagnosis not present

## 2017-04-28 DIAGNOSIS — Z794 Long term (current) use of insulin: Secondary | ICD-10-CM | POA: Diagnosis not present

## 2017-04-28 DIAGNOSIS — Z7901 Long term (current) use of anticoagulants: Secondary | ICD-10-CM | POA: Diagnosis not present

## 2017-04-28 DIAGNOSIS — N183 Chronic kidney disease, stage 3 (moderate): Secondary | ICD-10-CM | POA: Diagnosis not present

## 2017-04-29 ENCOUNTER — Other Ambulatory Visit: Payer: Self-pay | Admitting: Sports Medicine

## 2017-04-29 ENCOUNTER — Other Ambulatory Visit: Payer: Self-pay | Admitting: Internal Medicine

## 2017-04-29 ENCOUNTER — Telehealth: Payer: Self-pay | Admitting: Endocrinology

## 2017-04-29 DIAGNOSIS — I10 Essential (primary) hypertension: Secondary | ICD-10-CM

## 2017-04-29 NOTE — Telephone Encounter (Signed)
Please clarify what he means by taking 60 units of insulin.   He is normally taking 4-6 clicks on his V-go pump Also confirm that he is drawing up 4 units and his insulin syringe of the U-500 for bedtime dose.  If he is doing this he can increase the dose to 8 units at bedtime; may also go up to 10 units if morning sugars are over 940 systolic Please have him schedule follow-up next week

## 2017-04-29 NOTE — Telephone Encounter (Signed)
Called patient and left a voice message to let him know that I will try to call him in the morning to go over the dosage changes with him.

## 2017-04-29 NOTE — Telephone Encounter (Signed)
Called patient and he stated that he is still having lower blood sugar readings in the evenings:   On 10/18 at 9:25 am was 236 and he took 60 units                    11:30am was 239  On 10/17 212 at 8:30am                 262 at 12:30pm                 184 at 2:50pm                  82 at 5:40pm                 109 at 7:30pm                  136 at 9:30pm  On 10/16 was 181 at 8:40am                 Was 176 11:30am                  Was 22 at 3:50pm                   Was 128 at 7:30pm  On 10/15 was 208 at 9:15am                  Was 180 at 2:20pm  Please advise.

## 2017-04-29 NOTE — Telephone Encounter (Signed)
BS are "getting out of wack" this AM it was 232 and after taking insulin 2 hours later it went up to 239. He has removed the pump

## 2017-04-30 DIAGNOSIS — Z4800 Encounter for change or removal of nonsurgical wound dressing: Secondary | ICD-10-CM | POA: Diagnosis not present

## 2017-04-30 DIAGNOSIS — N183 Chronic kidney disease, stage 3 (moderate): Secondary | ICD-10-CM | POA: Diagnosis not present

## 2017-04-30 DIAGNOSIS — E11311 Type 2 diabetes mellitus with unspecified diabetic retinopathy with macular edema: Secondary | ICD-10-CM | POA: Diagnosis not present

## 2017-04-30 DIAGNOSIS — I129 Hypertensive chronic kidney disease with stage 1 through stage 4 chronic kidney disease, or unspecified chronic kidney disease: Secondary | ICD-10-CM | POA: Diagnosis not present

## 2017-04-30 DIAGNOSIS — Z7901 Long term (current) use of anticoagulants: Secondary | ICD-10-CM | POA: Diagnosis not present

## 2017-04-30 DIAGNOSIS — E1122 Type 2 diabetes mellitus with diabetic chronic kidney disease: Secondary | ICD-10-CM | POA: Diagnosis not present

## 2017-04-30 DIAGNOSIS — E11621 Type 2 diabetes mellitus with foot ulcer: Secondary | ICD-10-CM | POA: Diagnosis not present

## 2017-04-30 DIAGNOSIS — L97522 Non-pressure chronic ulcer of other part of left foot with fat layer exposed: Secondary | ICD-10-CM | POA: Diagnosis not present

## 2017-04-30 DIAGNOSIS — Z794 Long term (current) use of insulin: Secondary | ICD-10-CM | POA: Diagnosis not present

## 2017-04-30 DIAGNOSIS — Z89422 Acquired absence of other left toe(s): Secondary | ICD-10-CM | POA: Diagnosis not present

## 2017-04-30 DIAGNOSIS — E1142 Type 2 diabetes mellitus with diabetic polyneuropathy: Secondary | ICD-10-CM | POA: Diagnosis not present

## 2017-04-30 NOTE — Telephone Encounter (Signed)
Pt calling back about dosage changes

## 2017-04-30 NOTE — Telephone Encounter (Signed)
He can try to leave his pump on tonight to see his sugars are still getting low overnight and not take any Humulin R U-500 at bedtime.  If he is again getting low sugars overnight he can start using 80 units of the Humulin R U-500 insulin pen at bedtime

## 2017-04-30 NOTE — Telephone Encounter (Signed)
Called patient and he stated that this morning at 9am it was 086 and took 6 clicks of his insulin and came back and at 11:40am  it was 227 and took 6 more clicks and then at 5:78IO it was 298.  On the U500 pen it only shows 10,20,30,40 and he does not know how to give 4 units. He has been giving 60 units of the pen. He only uses the syringes for filling his VGo and the pen for injecting on himself. Now his blood sugar is 186.   Please advise.

## 2017-04-30 NOTE — Telephone Encounter (Signed)
Called patient and left a voice message to let him know to try to leave his VGo pump on tonight and if he gets low again during the night to take it back off at night again and to keep a record of his blood sugars and the times he took them and the number of clicks of his VGo pump and call us back on Monday to let us know how he is.

## 2017-05-03 ENCOUNTER — Other Ambulatory Visit: Payer: Self-pay | Admitting: Endocrinology

## 2017-05-03 ENCOUNTER — Telehealth: Payer: Self-pay | Admitting: Sports Medicine

## 2017-05-03 DIAGNOSIS — Z4800 Encounter for change or removal of nonsurgical wound dressing: Secondary | ICD-10-CM | POA: Diagnosis not present

## 2017-05-03 DIAGNOSIS — Z794 Long term (current) use of insulin: Secondary | ICD-10-CM | POA: Diagnosis not present

## 2017-05-03 DIAGNOSIS — I129 Hypertensive chronic kidney disease with stage 1 through stage 4 chronic kidney disease, or unspecified chronic kidney disease: Secondary | ICD-10-CM | POA: Diagnosis not present

## 2017-05-03 DIAGNOSIS — E1122 Type 2 diabetes mellitus with diabetic chronic kidney disease: Secondary | ICD-10-CM | POA: Diagnosis not present

## 2017-05-03 DIAGNOSIS — E11311 Type 2 diabetes mellitus with unspecified diabetic retinopathy with macular edema: Secondary | ICD-10-CM | POA: Diagnosis not present

## 2017-05-03 DIAGNOSIS — E1165 Type 2 diabetes mellitus with hyperglycemia: Secondary | ICD-10-CM

## 2017-05-03 DIAGNOSIS — N183 Chronic kidney disease, stage 3 (moderate): Secondary | ICD-10-CM | POA: Diagnosis not present

## 2017-05-03 DIAGNOSIS — Z7901 Long term (current) use of anticoagulants: Secondary | ICD-10-CM | POA: Diagnosis not present

## 2017-05-03 DIAGNOSIS — L97522 Non-pressure chronic ulcer of other part of left foot with fat layer exposed: Secondary | ICD-10-CM | POA: Diagnosis not present

## 2017-05-03 DIAGNOSIS — E11621 Type 2 diabetes mellitus with foot ulcer: Secondary | ICD-10-CM | POA: Diagnosis not present

## 2017-05-03 DIAGNOSIS — Z89422 Acquired absence of other left toe(s): Secondary | ICD-10-CM | POA: Diagnosis not present

## 2017-05-03 DIAGNOSIS — E1142 Type 2 diabetes mellitus with diabetic polyneuropathy: Secondary | ICD-10-CM | POA: Diagnosis not present

## 2017-05-03 NOTE — Telephone Encounter (Signed)
He is still leaving off his pump at night even though he was supposed to keep it on.  Today he had cereal and banana at breakfast and blood sugar went up but yesterday blood sugars were excellent.  He will continue his regimen unchanged but have him see the dietitian and have low carbohydrate high protein breakfast daily

## 2017-05-03 NOTE — Telephone Encounter (Signed)
Please call patient to answer his questions

## 2017-05-03 NOTE — Telephone Encounter (Signed)
Called patient and this is what he gave me:  10/20 was 4:20am ws 62      9:02am was 83    11:50am was 226    12:30pm was 270    5:50pm was 211    7:45pm was 203  10/21 was 8:22am was 193    11:35am was 195    2:02pm was 190    5:10pm was 90   7:30pm was 130   9:30pm was 170  10/22 was 4am 102    8am was 169   10:30am was 197   12:30pm was 363   2:30pm was 266   3:45 was 213  Please advise on how much to take.

## 2017-05-03 NOTE — Telephone Encounter (Signed)
Left message informing [name] that Dr. Marcene Duos to the orders to extend [facility].

## 2017-05-03 NOTE — Telephone Encounter (Signed)
Please review his blood sugars, he had been leaving off his 20 unit V-go pump overnight and then this was resumed..  Also needs to see me as soon as possible for follow-up

## 2017-05-03 NOTE — Telephone Encounter (Signed)
This is Teacher, music, Therapist, sports with [facility]. Calling to see if I can get his visits extended for wound care for his left foot. If someone could call me back at [phone].

## 2017-05-03 NOTE — Telephone Encounter (Signed)
Patient is returning call to [facility] needs to know how much insulin he need to put on the pump.

## 2017-05-04 NOTE — Telephone Encounter (Signed)
Dr. Dwyane Dee stated that he has already called this patient.  Vaughan Basta, can you see if he someone has made him an appointment to see Mickel Baas? Not sure if he needs to see you again?

## 2017-05-05 DIAGNOSIS — Z89422 Acquired absence of other left toe(s): Secondary | ICD-10-CM | POA: Diagnosis not present

## 2017-05-05 DIAGNOSIS — E11621 Type 2 diabetes mellitus with foot ulcer: Secondary | ICD-10-CM | POA: Diagnosis not present

## 2017-05-05 DIAGNOSIS — E1122 Type 2 diabetes mellitus with diabetic chronic kidney disease: Secondary | ICD-10-CM | POA: Diagnosis not present

## 2017-05-05 DIAGNOSIS — I129 Hypertensive chronic kidney disease with stage 1 through stage 4 chronic kidney disease, or unspecified chronic kidney disease: Secondary | ICD-10-CM | POA: Diagnosis not present

## 2017-05-05 DIAGNOSIS — Z4800 Encounter for change or removal of nonsurgical wound dressing: Secondary | ICD-10-CM | POA: Diagnosis not present

## 2017-05-05 DIAGNOSIS — Z794 Long term (current) use of insulin: Secondary | ICD-10-CM | POA: Diagnosis not present

## 2017-05-05 DIAGNOSIS — N183 Chronic kidney disease, stage 3 (moderate): Secondary | ICD-10-CM | POA: Diagnosis not present

## 2017-05-05 DIAGNOSIS — E11311 Type 2 diabetes mellitus with unspecified diabetic retinopathy with macular edema: Secondary | ICD-10-CM | POA: Diagnosis not present

## 2017-05-05 DIAGNOSIS — Z7901 Long term (current) use of anticoagulants: Secondary | ICD-10-CM | POA: Diagnosis not present

## 2017-05-05 DIAGNOSIS — E1142 Type 2 diabetes mellitus with diabetic polyneuropathy: Secondary | ICD-10-CM | POA: Diagnosis not present

## 2017-05-05 DIAGNOSIS — L97522 Non-pressure chronic ulcer of other part of left foot with fat layer exposed: Secondary | ICD-10-CM | POA: Diagnosis not present

## 2017-05-05 NOTE — Telephone Encounter (Signed)
Patient reports that he is not removing the V-go at night, and that his blood sugars are doing "much better" Yesterday: 1:40 AM: 148, 4:15AM: 103,  7:15AM: 70 (acB), 8:45AM: 130,  11AM: (acL): 120,  3PM: 114,  7:15PM: ac/sL 140,  9PM: 229,  Today: 3:30AM: 166,  9:25AM: 127. Told to continue on same treatment, and to call if blood sugars start to drop low.  He agreed to do this.

## 2017-05-06 ENCOUNTER — Telehealth: Payer: Self-pay | Admitting: Sports Medicine

## 2017-05-06 NOTE — Telephone Encounter (Signed)
I informed Levada Dy - [facility], Dr. Cannon Kettle ordered continue wound care as recommended.

## 2017-05-06 NOTE — Telephone Encounter (Signed)
This is [name] with [facility]. I re-certified pt today and am requesting orders to be able to see him 3 times a week for 6 weeks, 2 times a week for 3 weeks, and I will continue the wound care orders from Dr. Cannon Kettle. If someone could call me and let me know if these orders are okay. My number is [phone] and if I do not answer, you can leave a message as I have a confidential voicemail.

## 2017-05-07 DIAGNOSIS — E1142 Type 2 diabetes mellitus with diabetic polyneuropathy: Secondary | ICD-10-CM | POA: Diagnosis not present

## 2017-05-07 DIAGNOSIS — Z7901 Long term (current) use of anticoagulants: Secondary | ICD-10-CM | POA: Diagnosis not present

## 2017-05-07 DIAGNOSIS — Z794 Long term (current) use of insulin: Secondary | ICD-10-CM | POA: Diagnosis not present

## 2017-05-07 DIAGNOSIS — I129 Hypertensive chronic kidney disease with stage 1 through stage 4 chronic kidney disease, or unspecified chronic kidney disease: Secondary | ICD-10-CM | POA: Diagnosis not present

## 2017-05-07 DIAGNOSIS — L97522 Non-pressure chronic ulcer of other part of left foot with fat layer exposed: Secondary | ICD-10-CM | POA: Diagnosis not present

## 2017-05-07 DIAGNOSIS — E1122 Type 2 diabetes mellitus with diabetic chronic kidney disease: Secondary | ICD-10-CM | POA: Diagnosis not present

## 2017-05-07 DIAGNOSIS — Z89422 Acquired absence of other left toe(s): Secondary | ICD-10-CM | POA: Diagnosis not present

## 2017-05-07 DIAGNOSIS — N183 Chronic kidney disease, stage 3 (moderate): Secondary | ICD-10-CM | POA: Diagnosis not present

## 2017-05-07 DIAGNOSIS — E11621 Type 2 diabetes mellitus with foot ulcer: Secondary | ICD-10-CM | POA: Diagnosis not present

## 2017-05-07 DIAGNOSIS — E11311 Type 2 diabetes mellitus with unspecified diabetic retinopathy with macular edema: Secondary | ICD-10-CM | POA: Diagnosis not present

## 2017-05-07 DIAGNOSIS — Z4800 Encounter for change or removal of nonsurgical wound dressing: Secondary | ICD-10-CM | POA: Diagnosis not present

## 2017-05-10 DIAGNOSIS — E11621 Type 2 diabetes mellitus with foot ulcer: Secondary | ICD-10-CM | POA: Diagnosis not present

## 2017-05-10 DIAGNOSIS — Z7901 Long term (current) use of anticoagulants: Secondary | ICD-10-CM | POA: Diagnosis not present

## 2017-05-10 DIAGNOSIS — Z794 Long term (current) use of insulin: Secondary | ICD-10-CM | POA: Diagnosis not present

## 2017-05-10 DIAGNOSIS — Z4800 Encounter for change or removal of nonsurgical wound dressing: Secondary | ICD-10-CM | POA: Diagnosis not present

## 2017-05-10 DIAGNOSIS — Z89422 Acquired absence of other left toe(s): Secondary | ICD-10-CM | POA: Diagnosis not present

## 2017-05-10 DIAGNOSIS — E1122 Type 2 diabetes mellitus with diabetic chronic kidney disease: Secondary | ICD-10-CM | POA: Diagnosis not present

## 2017-05-10 DIAGNOSIS — N183 Chronic kidney disease, stage 3 (moderate): Secondary | ICD-10-CM | POA: Diagnosis not present

## 2017-05-10 DIAGNOSIS — E1142 Type 2 diabetes mellitus with diabetic polyneuropathy: Secondary | ICD-10-CM | POA: Diagnosis not present

## 2017-05-10 DIAGNOSIS — I129 Hypertensive chronic kidney disease with stage 1 through stage 4 chronic kidney disease, or unspecified chronic kidney disease: Secondary | ICD-10-CM | POA: Diagnosis not present

## 2017-05-10 DIAGNOSIS — E11311 Type 2 diabetes mellitus with unspecified diabetic retinopathy with macular edema: Secondary | ICD-10-CM | POA: Diagnosis not present

## 2017-05-10 DIAGNOSIS — L97522 Non-pressure chronic ulcer of other part of left foot with fat layer exposed: Secondary | ICD-10-CM | POA: Diagnosis not present

## 2017-05-12 DIAGNOSIS — E11621 Type 2 diabetes mellitus with foot ulcer: Secondary | ICD-10-CM | POA: Diagnosis not present

## 2017-05-12 DIAGNOSIS — E1122 Type 2 diabetes mellitus with diabetic chronic kidney disease: Secondary | ICD-10-CM | POA: Diagnosis not present

## 2017-05-12 DIAGNOSIS — Z89422 Acquired absence of other left toe(s): Secondary | ICD-10-CM | POA: Diagnosis not present

## 2017-05-12 DIAGNOSIS — Z4800 Encounter for change or removal of nonsurgical wound dressing: Secondary | ICD-10-CM | POA: Diagnosis not present

## 2017-05-12 DIAGNOSIS — E1142 Type 2 diabetes mellitus with diabetic polyneuropathy: Secondary | ICD-10-CM | POA: Diagnosis not present

## 2017-05-12 DIAGNOSIS — E11311 Type 2 diabetes mellitus with unspecified diabetic retinopathy with macular edema: Secondary | ICD-10-CM | POA: Diagnosis not present

## 2017-05-12 DIAGNOSIS — Z7901 Long term (current) use of anticoagulants: Secondary | ICD-10-CM | POA: Diagnosis not present

## 2017-05-12 DIAGNOSIS — I129 Hypertensive chronic kidney disease with stage 1 through stage 4 chronic kidney disease, or unspecified chronic kidney disease: Secondary | ICD-10-CM | POA: Diagnosis not present

## 2017-05-12 DIAGNOSIS — L97522 Non-pressure chronic ulcer of other part of left foot with fat layer exposed: Secondary | ICD-10-CM | POA: Diagnosis not present

## 2017-05-12 DIAGNOSIS — N183 Chronic kidney disease, stage 3 (moderate): Secondary | ICD-10-CM | POA: Diagnosis not present

## 2017-05-12 DIAGNOSIS — Z794 Long term (current) use of insulin: Secondary | ICD-10-CM | POA: Diagnosis not present

## 2017-05-13 DIAGNOSIS — E113512 Type 2 diabetes mellitus with proliferative diabetic retinopathy with macular edema, left eye: Secondary | ICD-10-CM | POA: Diagnosis not present

## 2017-05-14 ENCOUNTER — Telehealth: Payer: Self-pay | Admitting: *Deleted

## 2017-05-14 ENCOUNTER — Other Ambulatory Visit: Payer: Self-pay | Admitting: Endocrinology

## 2017-05-14 DIAGNOSIS — N183 Chronic kidney disease, stage 3 (moderate): Secondary | ICD-10-CM | POA: Diagnosis not present

## 2017-05-14 DIAGNOSIS — E1142 Type 2 diabetes mellitus with diabetic polyneuropathy: Secondary | ICD-10-CM | POA: Diagnosis not present

## 2017-05-14 DIAGNOSIS — Z89422 Acquired absence of other left toe(s): Secondary | ICD-10-CM | POA: Diagnosis not present

## 2017-05-14 DIAGNOSIS — I129 Hypertensive chronic kidney disease with stage 1 through stage 4 chronic kidney disease, or unspecified chronic kidney disease: Secondary | ICD-10-CM | POA: Diagnosis not present

## 2017-05-14 DIAGNOSIS — E1122 Type 2 diabetes mellitus with diabetic chronic kidney disease: Secondary | ICD-10-CM | POA: Diagnosis not present

## 2017-05-14 DIAGNOSIS — Z4800 Encounter for change or removal of nonsurgical wound dressing: Secondary | ICD-10-CM | POA: Diagnosis not present

## 2017-05-14 DIAGNOSIS — E11311 Type 2 diabetes mellitus with unspecified diabetic retinopathy with macular edema: Secondary | ICD-10-CM | POA: Diagnosis not present

## 2017-05-14 DIAGNOSIS — E11621 Type 2 diabetes mellitus with foot ulcer: Secondary | ICD-10-CM | POA: Diagnosis not present

## 2017-05-14 DIAGNOSIS — Z794 Long term (current) use of insulin: Secondary | ICD-10-CM | POA: Diagnosis not present

## 2017-05-14 DIAGNOSIS — Z7901 Long term (current) use of anticoagulants: Secondary | ICD-10-CM | POA: Diagnosis not present

## 2017-05-14 DIAGNOSIS — L97522 Non-pressure chronic ulcer of other part of left foot with fat layer exposed: Secondary | ICD-10-CM | POA: Diagnosis not present

## 2017-05-14 MED ORDER — BECAPLERMIN 0.01 % EX GEL: 1.0000 "application " | Freq: Every day | CUTANEOUS | 5 refills | Status: DC

## 2017-05-14 NOTE — Telephone Encounter (Signed)
Completed [employer] and faxed with last office visit.

## 2017-05-17 ENCOUNTER — Telehealth: Payer: Self-pay | Admitting: Sports Medicine

## 2017-05-17 DIAGNOSIS — E11311 Type 2 diabetes mellitus with unspecified diabetic retinopathy with macular edema: Secondary | ICD-10-CM | POA: Diagnosis not present

## 2017-05-17 DIAGNOSIS — Z7901 Long term (current) use of anticoagulants: Secondary | ICD-10-CM | POA: Diagnosis not present

## 2017-05-17 DIAGNOSIS — E1122 Type 2 diabetes mellitus with diabetic chronic kidney disease: Secondary | ICD-10-CM | POA: Diagnosis not present

## 2017-05-17 DIAGNOSIS — E1142 Type 2 diabetes mellitus with diabetic polyneuropathy: Secondary | ICD-10-CM | POA: Diagnosis not present

## 2017-05-17 DIAGNOSIS — G4733 Obstructive sleep apnea (adult) (pediatric): Secondary | ICD-10-CM | POA: Diagnosis not present

## 2017-05-17 DIAGNOSIS — L97522 Non-pressure chronic ulcer of other part of left foot with fat layer exposed: Secondary | ICD-10-CM | POA: Diagnosis not present

## 2017-05-17 DIAGNOSIS — Z794 Long term (current) use of insulin: Secondary | ICD-10-CM | POA: Diagnosis not present

## 2017-05-17 DIAGNOSIS — E11621 Type 2 diabetes mellitus with foot ulcer: Secondary | ICD-10-CM | POA: Diagnosis not present

## 2017-05-17 DIAGNOSIS — Z89422 Acquired absence of other left toe(s): Secondary | ICD-10-CM | POA: Diagnosis not present

## 2017-05-17 DIAGNOSIS — I129 Hypertensive chronic kidney disease with stage 1 through stage 4 chronic kidney disease, or unspecified chronic kidney disease: Secondary | ICD-10-CM | POA: Diagnosis not present

## 2017-05-17 DIAGNOSIS — N183 Chronic kidney disease, stage 3 (moderate): Secondary | ICD-10-CM | POA: Diagnosis not present

## 2017-05-17 DIAGNOSIS — Z4800 Encounter for change or removal of nonsurgical wound dressing: Secondary | ICD-10-CM | POA: Diagnosis not present

## 2017-05-17 NOTE — Telephone Encounter (Signed)
[employer] APPROVED REGRANEX GEL 0.01% REFERENCE# [id number], VALID THROUGH 09/22/2017 UNDER MEDICARE PART D BENEFIT.

## 2017-05-17 NOTE — Telephone Encounter (Addendum)
Pt states he has a paper to get a FreeStyle Glucometer and asked if we would fax for him tomorrow at his appt. I told pt to give to Dr. Leeanne Rio assistant and one of the staff would get faxed for him.

## 2017-05-17 NOTE — Telephone Encounter (Signed)
I'm trying to get in touch with Ms. [name]. My number is [phone]. Thank you and have a blessed day.

## 2017-05-18 ENCOUNTER — Ambulatory Visit: Payer: Medicare Other | Admitting: Dietician

## 2017-05-18 ENCOUNTER — Encounter: Payer: Self-pay | Admitting: Sports Medicine

## 2017-05-18 ENCOUNTER — Ambulatory Visit (INDEPENDENT_AMBULATORY_CARE_PROVIDER_SITE_OTHER): Payer: Medicare Other | Admitting: Sports Medicine

## 2017-05-18 DIAGNOSIS — L97521 Non-pressure chronic ulcer of other part of left foot limited to breakdown of skin: Secondary | ICD-10-CM | POA: Diagnosis not present

## 2017-05-18 DIAGNOSIS — E0842 Diabetes mellitus due to underlying condition with diabetic polyneuropathy: Secondary | ICD-10-CM | POA: Diagnosis not present

## 2017-05-18 DIAGNOSIS — L97522 Non-pressure chronic ulcer of other part of left foot with fat layer exposed: Secondary | ICD-10-CM | POA: Diagnosis not present

## 2017-05-18 DIAGNOSIS — Z794 Long term (current) use of insulin: Secondary | ICD-10-CM | POA: Diagnosis not present

## 2017-05-18 DIAGNOSIS — E11621 Type 2 diabetes mellitus with foot ulcer: Secondary | ICD-10-CM | POA: Diagnosis not present

## 2017-05-18 DIAGNOSIS — Z89432 Acquired absence of left foot: Secondary | ICD-10-CM

## 2017-05-18 DIAGNOSIS — IMO0002 Reserved for concepts with insufficient information to code with codable children: Secondary | ICD-10-CM

## 2017-05-18 DIAGNOSIS — E119 Type 2 diabetes mellitus without complications: Secondary | ICD-10-CM | POA: Diagnosis not present

## 2017-05-18 NOTE — Progress Notes (Signed)
Subjective: [name] is a 66 y.o. male patient seen today in office for POV #31 (DOS 06-15-16), S/P Left wound debridement and placement of stravix allograft. Patient is using regranex with stalling, denies pain at surgical site, states that when he does have pain sometimes 3/10, unchanged from prior; denies calf pain, denies headache, chest pain, shortness of breath, nausea, vomiting, fever, or chills. Patient states that he is doing well with blood sugars on pump, awaiting new meter. No other issues noted.   Patient Active Problem List   Diagnosis Date Noted  . Chronic pain syndrome 09/18/2015  . Preventative health care 03/12/2015  . Foot ulcer, left ([place]) 12/13/2014  . Paroxysmal atrial fibrillation ([place]) 12/13/2014  . Elevated alkaline phosphatase level 06/11/2014  . Constipation 05/31/2014  . Vitamin D deficiency 11/17/2013  . Anemia 02/10/2012  . Hypertension 11/18/2011  . Obstructive sleep apnea 12/12/2009  . GERD 06/27/2008  . Diabetes type 2, uncontrolled ([place]) 12/28/2007  . DIABETIC MACULAR EDEMA 10/07/2007  . Dyslipidemia 08/15/2007  . BACKGROUND DIABETIC RETINOPATHY 08/15/2007  . HEARING LOSS, SENSORINEURAL, BILATERAL 01/03/2007  . Diabetic peripheral neuropathy associated with type 2 diabetes mellitus ([place]) 12/16/2006  . Chronic kidney disease, stage III (moderate) ([place]) 12/01/2006  . Morbid obesity ([place]) 08/23/2006  . STATUS, OTHER TOE(S) AMPUTATION 05/21/2006    Current Outpatient Medications on File Prior to Visit  Medication Sig Dispense Refill  . ACCU-CHEK FASTCLIX LANCETS MISC Check blood sugar 5 times a day 408 each 3  . atorvastatin (LIPITOR) 20 MG tablet TAKE 1 TABLET(20 MG) BY MOUTH DAILY 90 tablet 0  . becaplermin (REGRANEX) 0.01 % gel Apply 1 application topically daily. 15 g 5  . becaplermin (REGRANEX) 0.01 % gel Apply 1 application topically daily. 15 g 5  . Blood Glucose Monitoring Suppl (ACCU-CHEK AVIVA PLUS) w/Device KIT CHECK BLOOD SUGAR  5 TIMES A DAY 1 kit 0  . Cholecalciferol (VITAMIN D3) 2000 UNITS capsule Take 1 capsule (2,000 Units total) by mouth daily. 90 capsule 0  . diclofenac sodium (VOLTAREN) 1 % GEL Apply 4 g topically 4 (four) times daily. 100 g 2  . docusate sodium (COLACE) 100 MG capsule Take 1 capsule (100 mg total) by mouth daily as needed for mild constipation. 14 capsule 0  . ELIQUIS 5 MG TABS tablet TAKE 1 TABLET(5 MG) BY MOUTH TWICE DAILY 180 tablet 3  . enalapril (VASOTEC) 20 MG tablet TAKE 2 TABLETS(40 MG) BY MOUTH DAILY 180 tablet 1  . furosemide (LASIX) 40 MG tablet Take 1 tablet (40 mg total) by mouth 2 (two) times daily. 180 tablet 1  . gabapentin (NEURONTIN) 300 MG capsule TAKE 1 CAPSULE(300 MG) BY MOUTH AT BEDTIME 90 capsule 3  . glucose blood (ACCU-CHEK AVIVA PLUS) test strip USE TO CHECK BLOOD Five TIMES DAILY 450 each 3  . HUMULIN R 500 UNIT/ML injection DRAW INSULIN TO THE 68 UNIT LINE ON THE U-100 SYRINGE(TO USE 340 UNITS) PER DAY AS DIRECTED 20 mL 0  . HYDROcodone-acetaminophen (NORCO) 7.5-325 MG tablet Take 1 tablet by mouth every 6 (six) hours as needed for moderate pain. 130 tablet 0  . Insulin Disposable Pump (V-GO 20) KIT Use 1 pimp daily with insulin 30 kit 4  . Insulin Pen Needle 31G X 5 MM MISC Use for insulin injection 5 times a day. 150 each 5  . insulin regular human CONCENTRATED (HUMULIN R U-500 KWIKPEN) 500 UNIT/ML kwikpen INJECT 35 UNITS AT BREAKFAST, 60 UNITS AT LUNCH, AND 70 UNITS  AT DINNER (Patient taking differently: INJECT 35 UNITS AT BREAKFAST, 60 UNITS AT LUNCH, AND 70 UNITS AT DINNER; pt stated, "Have a pump and uses syringes"; dosage was changed to 20 units at breakfast, 40 units at lunch, 40 units at dinner) 20 mL 2  . Insulin Syringe-Needle U-100 25G X 1" 1 ML MISC Use 1 syringe daily to fill V-Go pump 30 each 3  . metoprolol succinate (TOPROL-XL) 25 MG 24 hr tablet TAKE 1 TABLET(25 MG) BY MOUTH DAILY 90 tablet 3  . omeprazole (PRILOSEC) 20 MG capsule TAKE 1 CAPSULE(20 MG)  BY MOUTH DAILY 90 capsule 1  . PROAIR HFA 108 (90 Base) MCG/ACT inhaler INHALE 2 PUFFS INTO THE LUNGS BY MOUTH EVERY 6 HOURS AS NEEDED FOR WHEEZING OR SHORTNESS OF BREATH 8.5 g 6  . promethazine (PHENERGAN) 25 MG tablet Take 1 tablet (25 mg total) by mouth every 8 (eight) hours as needed for nausea or vomiting. 30 tablet 0  . silver sulfADIAZINE (SILVADENE) 1 % cream Apply 1 application topically daily. 50 g 1  . SIMBRINZA 1-0.2 % SUSP Place 1 drop into both eyes 2 (two) times daily. Take as instructed by Dr. Katy Fitch.    . TOUJEO SOLOSTAR 300 UNIT/ML SOPN INJECT 60 UNITS INTO THE SKIN EVERY MORNING AND 90 UNITS INTO THE SKIN AT NIGHT 9 pen 0  . VICTOZA 18 MG/3ML SOPN Inject 0.3 mLs (1.8 mg total) into the skin daily. 9 mL 1   No current facility-administered medications on file prior to visit.     Allergies  Allergen Reactions  . Vancomycin     REACTION: ARF    Objective: There were no vitals filed for this visit.  General: No acute distress, AAOx3  Left foot: Plantar forefoot, wound limited to skin, wound bed measuring 1.0x1x0.3cm (larger than previous) with mild periwound keratosis, no swelling to left foot, no erythema, no warmth, no active drainage however on inner guaze there is drainage bloody drainage with no acute signs of infection noted, Capillary fill time <3 seconds in all digits remaining, amputation status of 2nd and 3rd toes. No pain with calf compression.   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[place])    -  Primary   Diabetes mellitus due to underlying condition with diabetic polyneuropathy, unspecified whether long term insulin use ([place])       Foot amputation status, left ([place])           -Patient seen and evaluated -Cleansed wound debirded wound margins excisionlly to healthy bleeding margins through the level of dermis and applied Regranex and offloading padding without  complication, post debridement measurements as above -Advised patient to make sure to keep dressings clean, dry, and intact allowing nursing to change using regranex and offloading padding -Advised patient to continue with forefoot offloading post-op shoe on left foot with use of cane -Advised patient to limit activity to necessity  -Advised patient to rest and elevate as necessary -Request for EPIFIX made to assist with healing wound  -Patient to return for wound check in 2-3 weeks. In the meantime, patient to call office if any issues or problems arise.   Landis Martins, DPM

## 2017-05-19 ENCOUNTER — Telehealth: Payer: Self-pay | Admitting: *Deleted

## 2017-05-19 DIAGNOSIS — I129 Hypertensive chronic kidney disease with stage 1 through stage 4 chronic kidney disease, or unspecified chronic kidney disease: Secondary | ICD-10-CM | POA: Diagnosis not present

## 2017-05-19 DIAGNOSIS — Z89422 Acquired absence of other left toe(s): Secondary | ICD-10-CM | POA: Diagnosis not present

## 2017-05-19 DIAGNOSIS — Z7901 Long term (current) use of anticoagulants: Secondary | ICD-10-CM | POA: Diagnosis not present

## 2017-05-19 DIAGNOSIS — E11311 Type 2 diabetes mellitus with unspecified diabetic retinopathy with macular edema: Secondary | ICD-10-CM | POA: Diagnosis not present

## 2017-05-19 DIAGNOSIS — E113311 Type 2 diabetes mellitus with moderate nonproliferative diabetic retinopathy with macular edema, right eye: Secondary | ICD-10-CM | POA: Diagnosis not present

## 2017-05-19 DIAGNOSIS — E1142 Type 2 diabetes mellitus with diabetic polyneuropathy: Secondary | ICD-10-CM | POA: Diagnosis not present

## 2017-05-19 DIAGNOSIS — L97522 Non-pressure chronic ulcer of other part of left foot with fat layer exposed: Secondary | ICD-10-CM | POA: Diagnosis not present

## 2017-05-19 DIAGNOSIS — Z794 Long term (current) use of insulin: Secondary | ICD-10-CM | POA: Diagnosis not present

## 2017-05-19 DIAGNOSIS — E1122 Type 2 diabetes mellitus with diabetic chronic kidney disease: Secondary | ICD-10-CM | POA: Diagnosis not present

## 2017-05-19 DIAGNOSIS — Z4800 Encounter for change or removal of nonsurgical wound dressing: Secondary | ICD-10-CM | POA: Diagnosis not present

## 2017-05-19 DIAGNOSIS — N183 Chronic kidney disease, stage 3 (moderate): Secondary | ICD-10-CM | POA: Diagnosis not present

## 2017-05-19 DIAGNOSIS — E11621 Type 2 diabetes mellitus with foot ulcer: Secondary | ICD-10-CM | POA: Diagnosis not present

## 2017-05-19 NOTE — Telephone Encounter (Signed)
-----   Message from Landis Martins, [place] sent at 05/18/2017  9:28 AM EST ----- Regarding: Epifix  Left foot ulceration  1x1x0.3cm Epifix 24 mm disk

## 2017-05-19 NOTE — Telephone Encounter (Signed)
Faxed required form, clinicals and demographics to [employer] - [employer].

## 2017-05-20 ENCOUNTER — Encounter: Payer: Self-pay | Admitting: Dietician

## 2017-05-20 ENCOUNTER — Encounter: Payer: Medicare Other | Attending: Endocrinology | Admitting: Dietician

## 2017-05-20 DIAGNOSIS — Z713 Dietary counseling and surveillance: Secondary | ICD-10-CM | POA: Diagnosis not present

## 2017-05-20 DIAGNOSIS — Z794 Long term (current) use of insulin: Secondary | ICD-10-CM | POA: Diagnosis not present

## 2017-05-20 DIAGNOSIS — E119 Type 2 diabetes mellitus without complications: Secondary | ICD-10-CM | POA: Insufficient documentation

## 2017-05-20 DIAGNOSIS — E11649 Type 2 diabetes mellitus with hypoglycemia without coma: Secondary | ICD-10-CM

## 2017-05-20 LAB — LIPID PANEL
CHOL/HDL RATIO: 4
Cholesterol: 116 mg/dL (ref 0–200)
HDL: 30.8 mg/dL — ABNORMAL LOW (ref >39.00)
LDL CALC: 55 mg/dL (ref 0–99)
NONHDL: 85.63
Triglycerides: 155 mg/dL — ABNORMAL HIGH (ref 0.0–149.0)
VLDL: 31 mg/dL (ref 0.0–40.0)

## 2017-05-20 LAB — COMPREHENSIVE METABOLIC PANEL
ALT: 15 U/L (ref 0–53)
AST: 15 U/L (ref 0–37)
Albumin: 3.8 g/dL (ref 3.5–5.2)
Alkaline Phosphatase: 111 U/L (ref 39–117)
BUN: 22 mg/dL (ref 6–23)
CHLORIDE: 104 meq/L (ref 96–112)
CO2: 28 mEq/L (ref 19–32)
Calcium: 8.9 mg/dL (ref 8.4–10.5)
Creatinine, Ser: 1.74 mg/dL — ABNORMAL HIGH (ref 0.40–1.50)
GFR: 50.55 mL/min — ABNORMAL LOW (ref >60.00)
GLUCOSE: 153 mg/dL — AB (ref 70–99)
POTASSIUM: 4.2 meq/L (ref 3.5–5.1)
SODIUM: 139 meq/L (ref 135–145)
Total Bilirubin: 0.4 mg/dL (ref 0.2–1.2)
Total Protein: 7.4 g/dL (ref 6.0–8.3)

## 2017-05-20 LAB — HEMOGLOBIN A1C: Hgb A1c MFr Bld: 8 % — ABNORMAL HIGH (ref 4.6–6.5)

## 2017-05-20 NOTE — Patient Instructions (Signed)
Avoid dark soda Avoid bottled tea that has Phosphorous (or Phos.Marland Kitchen) in the ingredient list.  Consider making your own with tea bags and splenda or Stevia without sugar. Continue to reduce your sodium intake.    Avoid adding added salt to your food.  Discuss the lite salt use with your doctor.  Wean off this.  Choose meats that are fresh rather than canned or processed.  Choose lower sodium sausage that is lean. (Neeses has some products that are lower in sodium and leaner).  Watch the portion size.  Meat portion the size of a deck of cards.  1/2 your plate should be vegetables. Meat portion the size of a deck of cards. 1/4 plate starch.  (watch portion sizes of rice, potatoes, sweet potatoes, mac and cheese and other pasta as well as other starches such as bread).

## 2017-05-21 ENCOUNTER — Other Ambulatory Visit (INDEPENDENT_AMBULATORY_CARE_PROVIDER_SITE_OTHER): Payer: Medicare Other

## 2017-05-21 ENCOUNTER — Other Ambulatory Visit: Payer: Self-pay | Admitting: Internal Medicine

## 2017-05-21 DIAGNOSIS — E11621 Type 2 diabetes mellitus with foot ulcer: Secondary | ICD-10-CM | POA: Diagnosis not present

## 2017-05-21 DIAGNOSIS — Z794 Long term (current) use of insulin: Secondary | ICD-10-CM | POA: Diagnosis not present

## 2017-05-21 DIAGNOSIS — Z89422 Acquired absence of other left toe(s): Secondary | ICD-10-CM | POA: Diagnosis not present

## 2017-05-21 DIAGNOSIS — I129 Hypertensive chronic kidney disease with stage 1 through stage 4 chronic kidney disease, or unspecified chronic kidney disease: Secondary | ICD-10-CM | POA: Diagnosis not present

## 2017-05-21 DIAGNOSIS — E1122 Type 2 diabetes mellitus with diabetic chronic kidney disease: Secondary | ICD-10-CM | POA: Diagnosis not present

## 2017-05-21 DIAGNOSIS — E1165 Type 2 diabetes mellitus with hyperglycemia: Secondary | ICD-10-CM | POA: Diagnosis not present

## 2017-05-21 DIAGNOSIS — E11311 Type 2 diabetes mellitus with unspecified diabetic retinopathy with macular edema: Secondary | ICD-10-CM | POA: Diagnosis not present

## 2017-05-21 DIAGNOSIS — E1142 Type 2 diabetes mellitus with diabetic polyneuropathy: Secondary | ICD-10-CM | POA: Diagnosis not present

## 2017-05-21 DIAGNOSIS — N183 Chronic kidney disease, stage 3 (moderate): Secondary | ICD-10-CM | POA: Diagnosis not present

## 2017-05-21 DIAGNOSIS — L97522 Non-pressure chronic ulcer of other part of left foot with fat layer exposed: Secondary | ICD-10-CM | POA: Diagnosis not present

## 2017-05-21 DIAGNOSIS — Z4800 Encounter for change or removal of nonsurgical wound dressing: Secondary | ICD-10-CM | POA: Diagnosis not present

## 2017-05-21 DIAGNOSIS — Z7901 Long term (current) use of anticoagulants: Secondary | ICD-10-CM | POA: Diagnosis not present

## 2017-05-21 NOTE — Progress Notes (Signed)
Diabetes Self-Management Education  Visit Type: Follow-up  Appt. Start Time: 1030 Appt. End Time: 1200  05/21/2017  [name], identified by name and date of birth, is a 66 y.o. male with a diagnosis of Diabetes:  Type 2 a patient of Dr. Dwyane Dee.  He is also seen at the [facility].  He has had nutrition and CDE appointments over the past 2 years with myself, Vaughan Basta, CDE; and Butch Penny, CDE. History includes CKD, HTN, OSA on C-pap, hyperlipidemia, and GERD.  He has had a couple of toes amputated and is followed at the wound clinic for a wound on foot for the past year. Medications include u-500 in a V-go.  He uses 2 clicks for breakfast, 4 clicks for lunch, and 5 clicks for dinner.   He just got a [employer]. He complains that he has gained weight and is often hungry.   Weight at my visit 07/01/15 was 393 lbs.  Today 406 lbs.    Labs include:  BUN 22, Creatinine 1.8, GFR 38, Potassium 4.5 04/20/17.  Patient lives alone.  His wife is in and out of the home.  She does assist him at times with his wound.  He does the shopping and cooking.  He does rely on [employer] for transportation.  Walking is limited due to foot wound.  ASSESSMENT  Height 6' 6"  (1.981 m), weight (!) 406 lb (184.2 kg). Body mass index is 46.92 kg/m.  Diabetes Self-Management Education - 05/20/17 1059      Visit Information   Visit Type  Follow-up      Health Coping   How would you rate your overall health?  Fair      Psychosocial Assessment   Patient Belief/Attitude about Diabetes  Motivated to manage diabetes    Self-care barriers  None    Self-management support  Doctor's office    Other persons present  Patient    Patient Concerns  Nutrition/Meal planning;Weight Control;Glycemic Control    Special Needs  None    Preferred Learning Style  No preference indicated    Learning Readiness  Ready    How often do you need to have someone help you when you read instructions, pamphlets, or other  written materials from your doctor or pharmacy?  3 - Sometimes    What is the last grade level you completed in school?  8th grade      Pre-Education Assessment   Patient understands the diabetes disease and treatment process.  Demonstrates understanding / competency    Patient understands incorporating nutritional management into lifestyle.  Needs Review    Patient undertands incorporating physical activity into lifestyle.  Needs Review    Patient understands using medications safely.  Needs Review    Patient understands monitoring blood glucose, interpreting and using results  Needs Review    Patient understands prevention, detection, and treatment of acute complications.  Needs Review    Patient understands prevention, detection, and treatment of chronic complications.  Needs Review    Patient understands how to develop strategies to address psychosocial issues.  Needs Review    Patient understands how to develop strategies to promote health/change behavior.  Needs Review      Complications   Last HgB A1C per patient/outside source  9.3 % 03/01/17   03/01/17   How often do you check your blood sugar?  > 4 times/day Free Style Libre   Free Style Libre   Fasting Blood glucose range (mg/dL)  <70;70-129  Postprandial Blood glucose range (mg/dL)  180-200    Number of hypoglycemic episodes per month  5    Can you tell when your blood sugar is low?  Yes    What do you do if your blood sugar is low?  drinks 4 ounces of juice and eats (sausage and toast with OJ this am)    Number of hyperglycemic episodes per week  2    Can you tell when your blood sugar is high?  Yes    What do you do if your blood sugar is high?  takes 2-3 clicks on V-go depending on how high it is    Have you had a dilated eye exam in the past 12 months?  Yes    Have you had a dental exam in the past 12 months?  Yes    Are you checking your feet?  Yes nurse comes 3 times per week to dress his wound on left food and he  checks right food daily.   nurse comes 3 times per week to dress his wound on left food and he checks right food daily.   How many days per week are you checking your feet?  7      Dietary Intake   Breakfast  OJ, toast, sausage this am because blood sugar was low but usually SKIPS and just drinks G2 gatorade OR coffee with splenda, powdered cream    Snack (morning)  none    Lunch  sausage, egg, toast OR soup and rice OR 2 hot dogs with chili    Snack (afternoon)  peanuts or other nuts or cheetos or NABS    Dinner  sirloin, bread, chips or with potatoes and vegetables OR chicken or fish or sausage, starch, vegetable OR sandwich  5-6   5-6   Snack (evening)  NABS OR banana OR prezels and tortilla chips    Beverage(s)  water, G2 Gatorade, diet bottled tea, diet Pepsi, LS tomato v-8 juice, coffee with splenda and powdered creamer, grapefruit juice      Exercise   Exercise Type  ADL's [facility] [facility] past.   Has gone to the [facility] in the past.   How many days per week to you exercise?  0    How many minutes per day do you exercise?  0    Total minutes per week of exercise  0      Patient Education   Previous Diabetes Education  Yes (please comment) myself 2016, and since CDE Vaughan Basta and Butch Penny   myself 2016, and since CDE Vaughan Basta and Butch Penny   Nutrition management   Role of diet in the treatment of diabetes and the relationship between the three main macronutrients and blood glucose level;Food label reading, portion sizes and measuring food.;Meal options for control of blood glucose level and chronic complications.    Physical activity and exercise   Identified with patient nutritional and/or medication changes necessary with exercise.    Medications  Reviewed patients medication for diabetes, action, purpose, timing of dose and side effects.    Monitoring  Daily foot exams;Yearly dilated eye exam    Acute complications  Taught treatment of hypoglycemia - the 15 rule.    Chronic  complications  Identified and discussed with patient  current chronic complications    Psychosocial adjustment  Worked with patient to identify barriers to care and solutions    Personal strategies to promote health  Lifestyle issues that need to be  addressed for better diabetes care      Individualized Goals (developed by patient)   Nutrition  General guidelines for healthy choices and portions discussed    Physical Activity  15 minutes per day    Medications  take my medication as prescribed    Monitoring   test my blood glucose as discussed    Reducing Risk  examine blood glucose patterns;do foot checks daily    Health Coping  discuss diabetes with (comment) MD, RD, CDE   MD, RD, CDE     Post-Education Assessment   Patient understands the diabetes disease and treatment process.  Demonstrates understanding / competency    Patient understands incorporating nutritional management into lifestyle.  Needs Review    Patient undertands incorporating physical activity into lifestyle.  Demonstrates understanding / competency    Patient understands using medications safely.  Demonstrates understanding / competency    Patient understands monitoring blood glucose, interpreting and using results  Demonstrates understanding / competency    Patient understands prevention, detection, and treatment of acute complications.  Demonstrates understanding / competency    Patient understands prevention, detection, and treatment of chronic complications.  Demonstrates understanding / competency    Patient understands how to develop strategies to address psychosocial issues.  Demonstrates understanding / competency    Patient understands how to develop strategies to promote health/change behavior.  Demonstrates understanding / competency      Outcomes   Expected Outcomes  Demonstrated interest in learning. Expect positive outcomes    Future DMSE  4-6 wks    Program Status  Not Completed      Subsequent Visit    Since your last visit have you continued or begun to take your medications as prescribed?  Yes    Since your last visit have you had your blood pressure checked?  Yes    Is your most recent blood pressure lower, unchanged, or higher since your last visit?  Higher    Since your last visit have you experienced any weight changes?  Gain    Weight Gain (lbs)  13    Since your last visit, are you checking your blood glucose at least once a day?  Yes       Individualized Plan for Diabetes Self-Management Training:   Learning Objective:  Patient will have a greater understanding of diabetes self-management. Patient education plan is to attend individual and/or group sessions per assessed needs and concerns.   Plan:   Patient Instructions  Avoid dark soda Avoid bottled tea that has Phosphorous (or Phos.Marland Kitchen) in the ingredient list.  Consider making your own with tea bags and splenda or Stevia without sugar. Continue to reduce your sodium intake.    Avoid adding added salt to your food.  Discuss the lite salt use with your doctor.  Wean off this.  Choose meats that are fresh rather than canned or processed.  Choose lower sodium sausage that is lean. (Neeses has some products that are lower in sodium and leaner).  Watch the portion size.  Meat portion the size of a deck of cards.  1/2 your plate should be vegetables. Meat portion the size of a deck of cards. 1/4 plate starch.  (watch portion sizes of rice, potatoes, sweet potatoes, mac and cheese and other pasta as well as other starches such as bread).    Expected Outcomes:  Demonstrated interest in learning. Expect positive outcomes  Education material provided: Food label handouts, A1C conversion sheet, Meal plan card, My Plate  and Snack sheet  If problems or questions, patient to contact team via:  Phone  Future DSME appointment: 4-6 wks

## 2017-05-21 NOTE — Telephone Encounter (Signed)
Thank you :)

## 2017-05-21 NOTE — Telephone Encounter (Signed)
[facility] - [employer] states pt will have to pay about $709.64/RCVKF application. I informed pt of the insurance benefits review and that our office insurance coordinator could help him setup a payment plan. Pt states he would like to do the graft to get healed. Emailed order to [facility] for 36mm for 06/08/2017 at 10:00am.

## 2017-05-24 ENCOUNTER — Telehealth: Payer: Self-pay | Admitting: Endocrinology

## 2017-05-24 ENCOUNTER — Other Ambulatory Visit: Payer: Self-pay | Admitting: Internal Medicine

## 2017-05-24 DIAGNOSIS — L97522 Non-pressure chronic ulcer of other part of left foot with fat layer exposed: Secondary | ICD-10-CM | POA: Diagnosis not present

## 2017-05-24 DIAGNOSIS — IMO0002 Reserved for concepts with insufficient information to code with codable children: Secondary | ICD-10-CM

## 2017-05-24 DIAGNOSIS — Z7901 Long term (current) use of anticoagulants: Secondary | ICD-10-CM | POA: Diagnosis not present

## 2017-05-24 DIAGNOSIS — I129 Hypertensive chronic kidney disease with stage 1 through stage 4 chronic kidney disease, or unspecified chronic kidney disease: Secondary | ICD-10-CM | POA: Diagnosis not present

## 2017-05-24 DIAGNOSIS — Z4800 Encounter for change or removal of nonsurgical wound dressing: Secondary | ICD-10-CM | POA: Diagnosis not present

## 2017-05-24 DIAGNOSIS — E11621 Type 2 diabetes mellitus with foot ulcer: Secondary | ICD-10-CM | POA: Diagnosis not present

## 2017-05-24 DIAGNOSIS — Z794 Long term (current) use of insulin: Secondary | ICD-10-CM

## 2017-05-24 DIAGNOSIS — E1122 Type 2 diabetes mellitus with diabetic chronic kidney disease: Secondary | ICD-10-CM

## 2017-05-24 DIAGNOSIS — Z89422 Acquired absence of other left toe(s): Secondary | ICD-10-CM | POA: Diagnosis not present

## 2017-05-24 DIAGNOSIS — N183 Chronic kidney disease, stage 3 (moderate): Secondary | ICD-10-CM

## 2017-05-24 DIAGNOSIS — E11311 Type 2 diabetes mellitus with unspecified diabetic retinopathy with macular edema: Secondary | ICD-10-CM | POA: Diagnosis not present

## 2017-05-24 DIAGNOSIS — E1142 Type 2 diabetes mellitus with diabetic polyneuropathy: Secondary | ICD-10-CM | POA: Diagnosis not present

## 2017-05-24 DIAGNOSIS — E1165 Type 2 diabetes mellitus with hyperglycemia: Secondary | ICD-10-CM

## 2017-05-24 DIAGNOSIS — I10 Essential (primary) hypertension: Secondary | ICD-10-CM

## 2017-05-24 NOTE — Telephone Encounter (Signed)
I have filled this prescription per Dr. Dwyane Dee.

## 2017-05-24 NOTE — Telephone Encounter (Signed)
Please advise if okay to refill this prescription. Last filled by a different provider.

## 2017-05-24 NOTE — Telephone Encounter (Signed)
Okay to fill? 

## 2017-05-26 ENCOUNTER — Encounter: Payer: Self-pay | Admitting: Endocrinology

## 2017-05-26 ENCOUNTER — Ambulatory Visit (INDEPENDENT_AMBULATORY_CARE_PROVIDER_SITE_OTHER): Payer: Medicare Other | Admitting: Endocrinology

## 2017-05-26 VITALS — BP 152/88 | HR 76 | Ht 77.4 in | Wt >= 6400 oz

## 2017-05-26 DIAGNOSIS — I129 Hypertensive chronic kidney disease with stage 1 through stage 4 chronic kidney disease, or unspecified chronic kidney disease: Secondary | ICD-10-CM | POA: Diagnosis not present

## 2017-05-26 DIAGNOSIS — Z89422 Acquired absence of other left toe(s): Secondary | ICD-10-CM | POA: Diagnosis not present

## 2017-05-26 DIAGNOSIS — N183 Chronic kidney disease, stage 3 (moderate): Secondary | ICD-10-CM | POA: Diagnosis not present

## 2017-05-26 DIAGNOSIS — E1142 Type 2 diabetes mellitus with diabetic polyneuropathy: Secondary | ICD-10-CM | POA: Diagnosis not present

## 2017-05-26 DIAGNOSIS — E1165 Type 2 diabetes mellitus with hyperglycemia: Secondary | ICD-10-CM

## 2017-05-26 DIAGNOSIS — E1122 Type 2 diabetes mellitus with diabetic chronic kidney disease: Secondary | ICD-10-CM | POA: Diagnosis not present

## 2017-05-26 DIAGNOSIS — Z794 Long term (current) use of insulin: Secondary | ICD-10-CM

## 2017-05-26 DIAGNOSIS — Z4800 Encounter for change or removal of nonsurgical wound dressing: Secondary | ICD-10-CM | POA: Diagnosis not present

## 2017-05-26 DIAGNOSIS — Z7901 Long term (current) use of anticoagulants: Secondary | ICD-10-CM | POA: Diagnosis not present

## 2017-05-26 DIAGNOSIS — E11311 Type 2 diabetes mellitus with unspecified diabetic retinopathy with macular edema: Secondary | ICD-10-CM | POA: Diagnosis not present

## 2017-05-26 DIAGNOSIS — E11621 Type 2 diabetes mellitus with foot ulcer: Secondary | ICD-10-CM | POA: Diagnosis not present

## 2017-05-26 DIAGNOSIS — L97522 Non-pressure chronic ulcer of other part of left foot with fat layer exposed: Secondary | ICD-10-CM | POA: Diagnosis not present

## 2017-05-26 NOTE — Patient Instructions (Signed)
Take 5-6 clicks at supper

## 2017-05-26 NOTE — Progress Notes (Signed)
Patient ID: [name], male   DOB: [birth year], 66 y.o.   MRN: [id number]           Reason for Appointment: Follow-up for Type 2 Diabetes  Referring physician: Dareen Piano  History of Present Illness:          Date of diagnosis of type 2 diabetes mellitus: 2007       Background history:   He was diagnosed to have diabetes when he had an ulcer on his left third toe which led to amputation.  His glucose was apparently about 400 and he was started on insulin at that time He does not know if he has taken any oral hypoglycemic drugs in the past His A1c in 2016 has been consistently over 8% On his initial consultation was started on Victoza and subsequently started on U-500 insulin in 2/17 along with Toujeo INSULIN regimen previously was:  TOUJEO  74 in the morning and 74 in the evening HUMULIN U-500 insulin: 20/40 units in the morning, 60/70 at lunch and 70 at supper  Recent history:   Since 8/18 has been using V-go PUMP 20 unit basal, Humulin R U-500 insulin with boluses 4 units breakfast, 8 units 8-10 unis Changes his pump at 8 am  Non-insulin hypoglycemic drugs the patient is taking are: Victoza 1.8 mg daily        His last A1c was 9.3, now 8.0 Most recent fructosamine is 210  Current blood sugar patterns and problems identified:  His blood sugars were initially tending to get significantly low overnight with starting the V-go pump and the U-500 insulin  However subsequently he has not needed to take off his insulin pump during the night to prevent low blood sugars, he has done this only once with mild hypoglycemia occurring  RECENT average blood sugar is only 155 without any hypoglycemia documented  He is starting to use the freestyle LIBRE system now without any difficulty or difficulty with insurance coverage  With this he is checking blood sugars several times a day and 5 discontinued  However has not monitored his mistake and usually concomitantly, he thinks maybe  his sensor is reading about 10 mg lower than the fingerstick overall  LOWEST blood sugars are again still early morning between 6- 8 AM  HIGHEST blood sugars are between 10 PM-midnight averaging 201  Because of fear of hypoglycemia he is not increasing his boluses at suppertime and usually clicking 4-5 times  He is trying to bolus 30 minutes before eating but does not always of November to do this  His recent weight appears to have leveled off or slightly better  Blood sugars after breakfast and lunch are not high today blood sugar before lunch was 116  He has seen the dietitian in the past  Hypoglycemia: Minimal  Glucose monitoring:  done 3-4  times a day         Glucometer:  Accu-Chek     Blood Glucose readings by download:   2 week download indicates 28% the readings above 180, 70% within 70-180 and 2% below  PRE-MEAL Fasting Lunch Dinner Bedtime Overall  Glucose range: 60-1 54       Mean/median: 110  144  166  201   158+/-50    POST-MEAL PC Breakfast PC Lunch PC Dinner  Glucose range:  86-279   1 58-282   Mean/median:        Self-care:  Typical meal intake: Breakfast is sometimes fruit or cereal, Sometimes eating sausage  and grits Has been trying to avoid high-fat foods or fried food  Breakfast 8 AM Lunch 12 noon Dinner at 5-6 pm He drinks cranberry or V-8 juice and diet drinks                Dietician consultation: 12/16               Exercise: not walking,  has chronic foot ulcer on the left  Weight history: Previous range 260-410  Wt Readings from Last 3 Encounters:  05/26/17 (!) 400 lb 6.4 oz (181.6 kg)  05/20/17 (!) 406 lb (184.2 kg)  04/20/17 (!) 405 lb 12.8 oz (184.1 kg)    Glycemic control:   Lab Results  Component Value Date   HGBA1C 8.0 (H) 05/20/2017   HGBA1C 9.3 (H) 03/01/2017   HGBA1C 9.2 (H) 10/22/2016   Lab Results  Component Value Date   MICROALBUR 7.7 (H) 04/09/2017   LDLCALC 55 05/20/2017   CREATININE 1.74 (H) 05/20/2017    OTHER  problems review today: See review of systems     Lab on 05/21/2017  Component Date Value Ref Range Status  . Cholesterol 05/20/2017 116  0 - 200 mg/dL Final   ATP III Classification       Desirable:  < 200 mg/dL               Borderline High:  200 - 239 mg/dL          High:  > = 240 mg/dL  . Triglycerides 05/20/2017 155.0* 0.0 - 149.0 mg/dL Final   Normal:  <150 mg/dLBorderline High:  150 - 199 mg/dL  . HDL 05/20/2017 30.80* >39.00 mg/dL Final  . VLDL 05/20/2017 31.0  0.0 - 40.0 mg/dL Final  . LDL Cholesterol 05/20/2017 55  0 - 99 mg/dL Final  . Total CHOL/HDL Ratio 05/20/2017 4   Final                  Men          Women1/2 Average Risk     3.4          3.3Average Risk          5.0          4.42X Average Risk          9.6          7.13X Average Risk          15.0          11.0                      . NonHDL 05/20/2017 85.63   Final   NOTE:  Non-HDL goal should be 30 mg/dL higher than patient's LDL goal (i.e. LDL goal of < 70 mg/dL, would have non-HDL goal of < 100 mg/dL)  . Sodium 05/20/2017 139  135 - 145 mEq/L Final  . Potassium 05/20/2017 4.2  3.5 - 5.1 mEq/L Final  . Chloride 05/20/2017 104  96 - 112 mEq/L Final  . CO2 05/20/2017 28  19 - 32 mEq/L Final  . Glucose, Bld 05/20/2017 153* 70 - 99 mg/dL Final  . BUN 05/20/2017 22  6 - 23 mg/dL Final  . Creatinine, Ser 05/20/2017 1.74* 0.40 - 1.50 mg/dL Final  . Total Bilirubin 05/20/2017 0.4  0.2 - 1.2 mg/dL Final  . Alkaline Phosphatase 05/20/2017 111  39 - 117 U/L Final  . AST 05/20/2017 15  0 - 37 U/L  Final  . ALT 05/20/2017 15  0 - 53 U/L Final  . Total Protein 05/20/2017 7.4  6.0 - 8.3 g/dL Final  . Albumin 05/20/2017 3.8  3.5 - 5.2 g/dL Final  . Calcium 05/20/2017 8.9  8.4 - 10.5 mg/dL Final  . GFR 05/20/2017 50.55* >60.00 mL/min Final  . Hgb A1c MFr Bld 05/20/2017 8.0* 4.6 - 6.5 % Final   Glycemic Control Guidelines for People with Diabetes:Non Diabetic:  <6%Goal of Therapy: <7%Additional Action Suggested:  >8%         Allergies as of 05/26/2017      Reactions   Vancomycin    REACTION: ARF      Medication List        Accurate as of 05/26/17  3:00 PM. Always use your most recent med list.          ACCU-CHEK AVIVA PLUS w/Device Kit CHECK BLOOD SUGAR 5 TIMES A DAY   ACCU-CHEK FASTCLIX LANCETS Misc Check blood sugar 5 times a day   atorvastatin 20 MG tablet Commonly known as:  LIPITOR TAKE 1 TABLET(20 MG) BY MOUTH DAILY   becaplermin 0.01 % gel Commonly known as:  REGRANEX Apply 1 application topically daily.   diclofenac sodium 1 % Gel Commonly known as:  VOLTAREN Apply 4 g topically 4 (four) times daily.   docusate sodium 100 MG capsule Commonly known as:  COLACE Take 1 capsule (100 mg total) by mouth daily as needed for mild constipation.   ELIQUIS 5 MG Tabs tablet Generic drug:  apixaban TAKE 1 TABLET(5 MG) BY MOUTH TWICE DAILY   enalapril 20 MG tablet Commonly known as:  VASOTEC TAKE 2 TABLETS(40 MG) BY MOUTH DAILY   furosemide 40 MG tablet Commonly known as:  LASIX TAKE 1 TABLET(40 MG) BY MOUTH TWICE DAILY   gabapentin 300 MG capsule Commonly known as:  NEURONTIN TAKE 1 CAPSULE(300 MG) BY MOUTH AT BEDTIME   glucose blood test strip Commonly known as:  ACCU-CHEK AVIVA PLUS USE TO CHECK BLOOD Five TIMES DAILY   HYDROcodone-acetaminophen 7.5-325 MG tablet Commonly known as:  NORCO Take 1 tablet by mouth every 6 (six) hours as needed for moderate pain.   Insulin Pen Needle 31G X 5 MM Misc Use for insulin injection 5 times a day.   insulin regular human CONCENTRATED 500 UNIT/ML kwikpen Commonly known as:  HUMULIN R U-500 KWIKPEN INJECT 35 UNITS AT BREAKFAST, 60 UNITS AT LUNCH, AND 70 UNITS AT DINNER   HUMULIN R 500 UNIT/ML injection Generic drug:  insulin regular human CONCENTRATED DRAW INSULIN TO THE 68 UNIT LINE ON THE U-100 SYRINGE(TO USE 340 UNITS) PER DAY AS DIRECTED   Insulin Syringe-Needle U-100 25G X 1" 1 ML Misc Use 1 syringe daily to fill  V-Go pump   metoprolol succinate 25 MG 24 hr tablet Commonly known as:  TOPROL-XL TAKE 1 TABLET(25 MG) BY MOUTH DAILY   omeprazole 20 MG capsule Commonly known as:  PRILOSEC TAKE 1 CAPSULE(20 MG) BY MOUTH DAILY   PROAIR HFA 108 (90 Base) MCG/ACT inhaler Generic drug:  albuterol INHALE 2 PUFFS INTO THE LUNGS BY MOUTH EVERY 6 HOURS AS NEEDED FOR WHEEZING OR SHORTNESS OF BREATH   promethazine 25 MG tablet Commonly known as:  PHENERGAN Take 1 tablet (25 mg total) by mouth every 8 (eight) hours as needed for nausea or vomiting.   silver sulfADIAZINE 1 % cream Commonly known as:  SILVADENE Apply 1 application topically daily.   SIMBRINZA 1-0.2 % Susp Generic drug:  Brinzolamide-Brimonidine Place 1 drop into both eyes 2 (two) times daily. Take as instructed by Dr. Katy Fitch.   TOUJEO SOLOSTAR 300 UNIT/ML Sopn Generic drug:  Insulin Glargine INJECT 60 UNITS INTO THE SKIN EVERY MORNING AND 90 UNITS INTO THE SKIN AT NIGHT   V-GO 20 Kit Use 1 pimp daily with insulin   VICTOZA 18 MG/3ML Sopn Generic drug:  liraglutide ADMINISTER 1.8 MG UNDER THE SKIN DAILY   Vitamin D3 2000 units capsule Take 1 capsule (2,000 Units total) by mouth daily.       Allergies:  Allergies  Allergen Reactions  . Vancomycin     REACTION: ARF    Past Medical History:  Diagnosis Date  . Arthritis    "elbows & knees" (12/13/2014)  . Asthma   . CKD (chronic kidney disease), stage III ([place])   . DIABETIC FOOT ULCER 06/20/2009  . Edema, macular, due to secondary diabetes ([place])   . Erectile dysfunction   . GERD (gastroesophageal reflux disease)   . HEARING LOSS, SENSORINEURAL, BILATERAL 01/03/2007   Seen by ENT Dr. Orpah Greek D. Redmond Baseman 01/03/07  . Hemorrhoids   . History of echocardiogram    a. 04/2008 Echo: EF 50-55%, abnl LV relaxation, mildly dil LA.  Marland Kitchen Hyperlipidemia   . Hypertension   . Morbid obesity ([place])   . Neuropathy, lower extremity   . OSA (obstructive sleep apnea)    uses CPAP nightly  . OSA  on CPAP    Nocturnal polysomnogram on 01/21/2010 showed severe obstructive sleep apnea/hypopnea syndrome, AHI 74.1 per hour with non positional events, moderately loud snoring, and oxygen desaturation to a nadir of 78% on room air.  CPAP was successfully titrated to 17 CWP, AHI 1.1 per hour using a large ResMed Mirage Quattro full-face mask with heated humidifier. Bruxism was noted.   . Osteomyelitis of ankle and foot ([place])   . Retinopathy   . Type II diabetes mellitus (HCC)    w/complication NOS, type II    Past Surgical History:  Procedure Laterality Date  . 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 Kathalene Frames. Mayer Camel.    Family History  Problem Relation Age of Onset  . Diabetes Mother        also 2 siblings  . Heart attack Father 66  . Throat cancer Brother     Social History:  reports that he quit smoking about 7 years ago. His smoking use included cigarettes. He has a 15.00 pack-year smoking history. he has never used smokeless tobacco. He reports that he drinks about 6.6 oz of alcohol per week. He reports that he does not use drugs.    Review of Systems     Lipid management: taking Lipitor 20 mg, followed by PCP Also has low HDL    Lab Results  Component Value Date   CHOL 116 05/20/2017   HDL 30.80 (L) 05/20/2017   LDLCALC 55 05/20/2017   TRIG 155.0 (H) 05/20/2017   CHOLHDL 4 05/20/2017            Hypertension: Has had hypertension for a few years treated with enalapril,  taking 40 mg  Also on Lasix and 25 mg metoprolol This is treated by PCP  He also has chronic renal dysfunction, stable and mild  Lab Results  Component Value Date   CREATININE 1.74 (H) 05/20/2017   CREATININE 1.80 (H) 04/20/2017   CREATININE 1.84 (H) 04/09/2017  Last foot exam done by podiatrist Has been seen regularly by podiatrist for chronic ulcer   Physical Examination:  BP (!) 152/88   Pulse 76    Ht 6' 5.4" (1.966 m)   Wt (!) 400 lb 6.4 oz (181.6 kg)   SpO2 97%   BMI 46.99 kg/m    no significant edema present    ASSESSMENT:  Diabetes type 2, uncontrolled with morbid obesity  See history of present illness for detailed discussion of current diabetes management, blood sugar patterns and problems identified  With V-go pump and using U-500 insulin his blood sugars are overall better controlled although his A1c is still relatively high at 8%  Although he may be getting excessive basal insulin overnight occasionally he is not getting significant hypoglycemia low sugars have been documented only one night recently in the last 2 weeks Currently not clear if his fingerstick and sensor blood sugars are correlating, he has not done somebody needs readings Lowest blood sugars are early morning and highest blood sugars late at night indicating inadequate coverage of evening meal usually He can still do a little better at times with using low fat meals especially in the evening as discussed with patient today With improvement in his blood sugar is weight did increase but is leveling off  PLAN:     Will need to have him take extra 2 units bolus for his evening meal consistently and continue to take the same dose at breakfast and lunch  He will adjust his boluses based on his blood sugar reading and also his meal size  He'll continue to try and bolus 30 minutes before eating  Again reminded him to change his pump at the same time daily  Continue Victoza  Consider consultation with dietitian if continuing to gain weight  May have a bedtime snack if blood sugars are low normal at bedtime  Check blood sugar with Accu-Chek and the freestyle Libre simultaneously to see if they correlate   Patient Instructions  Take 5-6 clicks at supper       [facility] 05/26/2017, 3:00 PM   Note: This office note was prepared with Estate agent. Any transcriptional  errors that result from this process are unintentional.  Counseling time on subjects discussed in assessment and plan sections is over 50% of today's 25 minute visit

## 2017-05-27 DIAGNOSIS — H1013 Acute atopic conjunctivitis, bilateral: Secondary | ICD-10-CM | POA: Diagnosis not present

## 2017-05-28 ENCOUNTER — Telehealth: Payer: Self-pay | Admitting: Dietician

## 2017-05-28 DIAGNOSIS — Z89422 Acquired absence of other left toe(s): Secondary | ICD-10-CM | POA: Diagnosis not present

## 2017-05-28 DIAGNOSIS — Z794 Long term (current) use of insulin: Secondary | ICD-10-CM | POA: Diagnosis not present

## 2017-05-28 DIAGNOSIS — L97522 Non-pressure chronic ulcer of other part of left foot with fat layer exposed: Secondary | ICD-10-CM | POA: Diagnosis not present

## 2017-05-28 DIAGNOSIS — Z4800 Encounter for change or removal of nonsurgical wound dressing: Secondary | ICD-10-CM | POA: Diagnosis not present

## 2017-05-28 DIAGNOSIS — M545 Low back pain: Secondary | ICD-10-CM | POA: Diagnosis not present

## 2017-05-28 DIAGNOSIS — I129 Hypertensive chronic kidney disease with stage 1 through stage 4 chronic kidney disease, or unspecified chronic kidney disease: Secondary | ICD-10-CM | POA: Diagnosis not present

## 2017-05-28 DIAGNOSIS — M79672 Pain in left foot: Secondary | ICD-10-CM | POA: Diagnosis not present

## 2017-05-28 DIAGNOSIS — G894 Chronic pain syndrome: Secondary | ICD-10-CM | POA: Diagnosis not present

## 2017-05-28 DIAGNOSIS — Z7901 Long term (current) use of anticoagulants: Secondary | ICD-10-CM | POA: Diagnosis not present

## 2017-05-28 DIAGNOSIS — E1122 Type 2 diabetes mellitus with diabetic chronic kidney disease: Secondary | ICD-10-CM | POA: Diagnosis not present

## 2017-05-28 DIAGNOSIS — E1142 Type 2 diabetes mellitus with diabetic polyneuropathy: Secondary | ICD-10-CM | POA: Diagnosis not present

## 2017-05-28 DIAGNOSIS — E11621 Type 2 diabetes mellitus with foot ulcer: Secondary | ICD-10-CM | POA: Diagnosis not present

## 2017-05-28 DIAGNOSIS — E11311 Type 2 diabetes mellitus with unspecified diabetic retinopathy with macular edema: Secondary | ICD-10-CM | POA: Diagnosis not present

## 2017-05-28 DIAGNOSIS — N183 Chronic kidney disease, stage 3 (moderate): Secondary | ICD-10-CM | POA: Diagnosis not present

## 2017-05-28 DIAGNOSIS — Z79891 Long term (current) use of opiate analgesic: Secondary | ICD-10-CM | POA: Diagnosis not present

## 2017-05-28 NOTE — Telephone Encounter (Signed)
Brief nutrition Note Returned patient call.  He had asked if he could continue to drink the Gatorade.  He uses the zero carbs or G2.  Reviewed carbohydrate guidelines for beverages.  Told patient that the zero or G2 are fine.  Antonieta Iba, RD, LDN, CDE

## 2017-05-31 DIAGNOSIS — Z794 Long term (current) use of insulin: Secondary | ICD-10-CM | POA: Diagnosis not present

## 2017-05-31 DIAGNOSIS — L97522 Non-pressure chronic ulcer of other part of left foot with fat layer exposed: Secondary | ICD-10-CM | POA: Diagnosis not present

## 2017-05-31 DIAGNOSIS — Z89422 Acquired absence of other left toe(s): Secondary | ICD-10-CM | POA: Diagnosis not present

## 2017-05-31 DIAGNOSIS — E1122 Type 2 diabetes mellitus with diabetic chronic kidney disease: Secondary | ICD-10-CM | POA: Diagnosis not present

## 2017-05-31 DIAGNOSIS — I129 Hypertensive chronic kidney disease with stage 1 through stage 4 chronic kidney disease, or unspecified chronic kidney disease: Secondary | ICD-10-CM | POA: Diagnosis not present

## 2017-05-31 DIAGNOSIS — N183 Chronic kidney disease, stage 3 (moderate): Secondary | ICD-10-CM | POA: Diagnosis not present

## 2017-05-31 DIAGNOSIS — E11621 Type 2 diabetes mellitus with foot ulcer: Secondary | ICD-10-CM | POA: Diagnosis not present

## 2017-05-31 DIAGNOSIS — Z7901 Long term (current) use of anticoagulants: Secondary | ICD-10-CM | POA: Diagnosis not present

## 2017-05-31 DIAGNOSIS — E11311 Type 2 diabetes mellitus with unspecified diabetic retinopathy with macular edema: Secondary | ICD-10-CM | POA: Diagnosis not present

## 2017-05-31 DIAGNOSIS — E1142 Type 2 diabetes mellitus with diabetic polyneuropathy: Secondary | ICD-10-CM | POA: Diagnosis not present

## 2017-05-31 DIAGNOSIS — Z4800 Encounter for change or removal of nonsurgical wound dressing: Secondary | ICD-10-CM | POA: Diagnosis not present

## 2017-06-02 ENCOUNTER — Other Ambulatory Visit: Payer: Self-pay | Admitting: Endocrinology

## 2017-06-02 DIAGNOSIS — Z4800 Encounter for change or removal of nonsurgical wound dressing: Secondary | ICD-10-CM | POA: Diagnosis not present

## 2017-06-02 DIAGNOSIS — E1142 Type 2 diabetes mellitus with diabetic polyneuropathy: Secondary | ICD-10-CM | POA: Diagnosis not present

## 2017-06-02 DIAGNOSIS — E11621 Type 2 diabetes mellitus with foot ulcer: Secondary | ICD-10-CM | POA: Diagnosis not present

## 2017-06-02 DIAGNOSIS — Z794 Long term (current) use of insulin: Secondary | ICD-10-CM | POA: Diagnosis not present

## 2017-06-02 DIAGNOSIS — E11311 Type 2 diabetes mellitus with unspecified diabetic retinopathy with macular edema: Secondary | ICD-10-CM | POA: Diagnosis not present

## 2017-06-02 DIAGNOSIS — Z7901 Long term (current) use of anticoagulants: Secondary | ICD-10-CM | POA: Diagnosis not present

## 2017-06-02 DIAGNOSIS — E1122 Type 2 diabetes mellitus with diabetic chronic kidney disease: Secondary | ICD-10-CM | POA: Diagnosis not present

## 2017-06-02 DIAGNOSIS — I129 Hypertensive chronic kidney disease with stage 1 through stage 4 chronic kidney disease, or unspecified chronic kidney disease: Secondary | ICD-10-CM | POA: Diagnosis not present

## 2017-06-02 DIAGNOSIS — N183 Chronic kidney disease, stage 3 (moderate): Secondary | ICD-10-CM | POA: Diagnosis not present

## 2017-06-02 DIAGNOSIS — Z89422 Acquired absence of other left toe(s): Secondary | ICD-10-CM | POA: Diagnosis not present

## 2017-06-02 DIAGNOSIS — L97522 Non-pressure chronic ulcer of other part of left foot with fat layer exposed: Secondary | ICD-10-CM | POA: Diagnosis not present

## 2017-06-03 ENCOUNTER — Other Ambulatory Visit: Payer: Self-pay | Admitting: Internal Medicine

## 2017-06-04 DIAGNOSIS — N183 Chronic kidney disease, stage 3 (moderate): Secondary | ICD-10-CM | POA: Diagnosis not present

## 2017-06-04 DIAGNOSIS — L97522 Non-pressure chronic ulcer of other part of left foot with fat layer exposed: Secondary | ICD-10-CM | POA: Diagnosis not present

## 2017-06-04 DIAGNOSIS — E1142 Type 2 diabetes mellitus with diabetic polyneuropathy: Secondary | ICD-10-CM | POA: Diagnosis not present

## 2017-06-04 DIAGNOSIS — Z4800 Encounter for change or removal of nonsurgical wound dressing: Secondary | ICD-10-CM | POA: Diagnosis not present

## 2017-06-04 DIAGNOSIS — E11621 Type 2 diabetes mellitus with foot ulcer: Secondary | ICD-10-CM | POA: Diagnosis not present

## 2017-06-04 DIAGNOSIS — I129 Hypertensive chronic kidney disease with stage 1 through stage 4 chronic kidney disease, or unspecified chronic kidney disease: Secondary | ICD-10-CM | POA: Diagnosis not present

## 2017-06-04 DIAGNOSIS — Z89422 Acquired absence of other left toe(s): Secondary | ICD-10-CM | POA: Diagnosis not present

## 2017-06-04 DIAGNOSIS — Z7901 Long term (current) use of anticoagulants: Secondary | ICD-10-CM | POA: Diagnosis not present

## 2017-06-04 DIAGNOSIS — E1122 Type 2 diabetes mellitus with diabetic chronic kidney disease: Secondary | ICD-10-CM | POA: Diagnosis not present

## 2017-06-04 DIAGNOSIS — Z794 Long term (current) use of insulin: Secondary | ICD-10-CM | POA: Diagnosis not present

## 2017-06-04 DIAGNOSIS — E11311 Type 2 diabetes mellitus with unspecified diabetic retinopathy with macular edema: Secondary | ICD-10-CM | POA: Diagnosis not present

## 2017-06-07 DIAGNOSIS — G4733 Obstructive sleep apnea (adult) (pediatric): Secondary | ICD-10-CM | POA: Diagnosis not present

## 2017-06-07 DIAGNOSIS — Z89422 Acquired absence of other left toe(s): Secondary | ICD-10-CM | POA: Diagnosis not present

## 2017-06-07 DIAGNOSIS — Z794 Long term (current) use of insulin: Secondary | ICD-10-CM | POA: Diagnosis not present

## 2017-06-07 DIAGNOSIS — I129 Hypertensive chronic kidney disease with stage 1 through stage 4 chronic kidney disease, or unspecified chronic kidney disease: Secondary | ICD-10-CM | POA: Diagnosis not present

## 2017-06-07 DIAGNOSIS — Z7901 Long term (current) use of anticoagulants: Secondary | ICD-10-CM | POA: Diagnosis not present

## 2017-06-07 DIAGNOSIS — E1142 Type 2 diabetes mellitus with diabetic polyneuropathy: Secondary | ICD-10-CM | POA: Diagnosis not present

## 2017-06-07 DIAGNOSIS — E11311 Type 2 diabetes mellitus with unspecified diabetic retinopathy with macular edema: Secondary | ICD-10-CM | POA: Diagnosis not present

## 2017-06-07 DIAGNOSIS — E11621 Type 2 diabetes mellitus with foot ulcer: Secondary | ICD-10-CM | POA: Diagnosis not present

## 2017-06-07 DIAGNOSIS — L97522 Non-pressure chronic ulcer of other part of left foot with fat layer exposed: Secondary | ICD-10-CM | POA: Diagnosis not present

## 2017-06-07 DIAGNOSIS — Z4800 Encounter for change or removal of nonsurgical wound dressing: Secondary | ICD-10-CM | POA: Diagnosis not present

## 2017-06-07 DIAGNOSIS — E1122 Type 2 diabetes mellitus with diabetic chronic kidney disease: Secondary | ICD-10-CM | POA: Diagnosis not present

## 2017-06-07 DIAGNOSIS — N183 Chronic kidney disease, stage 3 (moderate): Secondary | ICD-10-CM | POA: Diagnosis not present

## 2017-06-08 ENCOUNTER — Telehealth: Payer: Self-pay | Admitting: *Deleted

## 2017-06-08 ENCOUNTER — Other Ambulatory Visit: Payer: Self-pay | Admitting: Endocrinology

## 2017-06-08 ENCOUNTER — Encounter: Payer: Self-pay | Admitting: Sports Medicine

## 2017-06-08 ENCOUNTER — Ambulatory Visit (INDEPENDENT_AMBULATORY_CARE_PROVIDER_SITE_OTHER): Payer: Medicare Other | Admitting: Sports Medicine

## 2017-06-08 VITALS — BP 160/84 | HR 67 | Resp 16

## 2017-06-08 DIAGNOSIS — E0842 Diabetes mellitus due to underlying condition with diabetic polyneuropathy: Secondary | ICD-10-CM | POA: Diagnosis not present

## 2017-06-08 DIAGNOSIS — L97522 Non-pressure chronic ulcer of other part of left foot with fat layer exposed: Secondary | ICD-10-CM

## 2017-06-08 DIAGNOSIS — Z89432 Acquired absence of left foot: Secondary | ICD-10-CM

## 2017-06-08 DIAGNOSIS — IMO0002 Reserved for concepts with insufficient information to code with codable children: Secondary | ICD-10-CM

## 2017-06-08 NOTE — Telephone Encounter (Signed)
-----   Message from Landis Martins, [place] sent at 06/08/2017 10:48 AM EST ----- Regarding: Reorder and Wound care orders  Wound care nurse to change guaze, adaptic and steristrips as they come off and apply offloading padding on left foot. Patient had epifix graft placed. Next dressing change should be on Friday.  Reorder EPIFIX 24 mm disk  -Dr. Cannon Kettle

## 2017-06-08 NOTE — Telephone Encounter (Signed)
Faxed Dr. Leeanne Rio orders 06/08/2017 10:48am - change gauze, Adaptic and steristrips at they come off and apply offloading padding on left foot, pt had EpiFix placed today and the next dressing change should be Friday, to [facility].

## 2017-06-08 NOTE — Telephone Encounter (Signed)
Emailed order to [place] for 2nd 44PE EpiFix for application on 48/35/0757 at 11:15am in the [facility]

## 2017-06-08 NOTE — Progress Notes (Signed)
Subjective: [name] is a 66 y.o. male patient seen today in office for POV #32 (DOS 06-15-16), S/P Left wound debridement and placement of stravix allograft. Patient is using regranex with stalling and is HERE TODAY FOR EPIFIX GRAFT APPLICATON, denies pain at surgical site, states that when he does have pain 4-5/10, unchanged from prior; denies calf pain, denies headache, chest pain, shortness of breath, nausea, vomiting, fever, or chills. Patient states that he is doing well with blood sugars on pump, last reading of FBS this AM was 135 and A1c 8. No other issues noted.   Patient Active Problem List   Diagnosis Date Noted  . Chronic pain syndrome 09/18/2015  . Preventative health care 03/12/2015  . Foot ulcer, left ([place]) 12/13/2014  . Paroxysmal atrial fibrillation ([place]) 12/13/2014  . Elevated alkaline phosphatase level 06/11/2014  . Constipation 05/31/2014  . Vitamin D deficiency 11/17/2013  . Anemia 02/10/2012  . Hypertension 11/18/2011  . Obstructive sleep apnea 12/12/2009  . GERD 06/27/2008  . Diabetes type 2, uncontrolled ([place]) 12/28/2007  . DIABETIC MACULAR EDEMA 10/07/2007  . Dyslipidemia 08/15/2007  . BACKGROUND DIABETIC RETINOPATHY 08/15/2007  . HEARING LOSS, SENSORINEURAL, BILATERAL 01/03/2007  . Diabetic peripheral neuropathy associated with type 2 diabetes mellitus ([place]) 12/16/2006  . Chronic kidney disease, stage III (moderate) ([place]) 12/01/2006  . Morbid obesity ([place]) 08/23/2006  . STATUS, OTHER TOE(S) AMPUTATION 05/21/2006    Current Outpatient Medications on File Prior to Visit  Medication Sig Dispense Refill  . ACCU-CHEK FASTCLIX LANCETS MISC Check blood sugar 5 times a day 408 each 3  . atorvastatin (LIPITOR) 20 MG tablet TAKE 1 TABLET(20 MG) BY MOUTH DAILY 90 tablet 1  . becaplermin (REGRANEX) 0.01 % gel Apply 1 application topically daily. 15 g 5  . Blood Glucose Monitoring Suppl (ACCU-CHEK AVIVA PLUS) w/Device KIT CHECK BLOOD SUGAR 5 TIMES A DAY 1  kit 0  . Cholecalciferol (VITAMIN D3) 2000 UNITS capsule Take 1 capsule (2,000 Units total) by mouth daily. 90 capsule 0  . diclofenac sodium (VOLTAREN) 1 % GEL Apply 4 g topically 4 (four) times daily. 100 g 2  . docusate sodium (COLACE) 100 MG capsule Take 1 capsule (100 mg total) by mouth daily as needed for mild constipation. 14 capsule 0  . ELIQUIS 5 MG TABS tablet TAKE 1 TABLET(5 MG) BY MOUTH TWICE DAILY 180 tablet 3  . enalapril (VASOTEC) 20 MG tablet TAKE 2 TABLETS(40 MG) BY MOUTH DAILY 180 tablet 0  . furosemide (LASIX) 40 MG tablet TAKE 1 TABLET(40 MG) BY MOUTH TWICE DAILY 60 tablet 3  . gabapentin (NEURONTIN) 300 MG capsule TAKE 1 CAPSULE(300 MG) BY MOUTH AT BEDTIME 90 capsule 3  . glucose blood (ACCU-CHEK AVIVA PLUS) test strip USE TO CHECK BLOOD Five TIMES DAILY 450 each 3  . HUMULIN R 500 UNIT/ML injection DRAW INSULIN TO THE 68 UNIT LINE ON THE U-100 SYRINGE(TO USE 340 UNITS) PER DAY AS DIRECTED 20 mL 0  . Insulin Disposable Pump (V-GO 20) KIT Use 1 pimp daily with insulin 30 kit 4  . Insulin Pen Needle 31G X 5 MM MISC Use for insulin injection 5 times a day. 150 each 5  . insulin regular human CONCENTRATED (HUMULIN R U-500 KWIKPEN) 500 UNIT/ML kwikpen INJECT 35 UNITS AT BREAKFAST, 60 UNITS AT LUNCH, AND 70 UNITS AT DINNER 20 mL 2  . INSULIN SYRINGE 1CC/29G 29G X 1/2" 1 ML MISC USE DAILY AS DIRECTED 100 each 0  . Insulin Syringe-Needle U-100  25G X 1" 1 ML MISC Use 1 syringe daily to fill V-Go pump 30 each 3  . metoprolol succinate (TOPROL-XL) 25 MG 24 hr tablet TAKE 1 TABLET(25 MG) BY MOUTH DAILY 90 tablet 0  . omeprazole (PRILOSEC) 20 MG capsule TAKE 1 CAPSULE(20 MG) BY MOUTH DAILY 90 capsule 0  . PROAIR HFA 108 (90 Base) MCG/ACT inhaler INHALE 2 PUFFS INTO THE LUNGS BY MOUTH EVERY 6 HOURS AS NEEDED FOR WHEEZING OR SHORTNESS OF BREATH 8.5 g 6  . promethazine (PHENERGAN) 25 MG tablet Take 1 tablet (25 mg total) by mouth every 8 (eight) hours as needed for nausea or vomiting. 30  tablet 0  . silver sulfADIAZINE (SILVADENE) 1 % cream Apply 1 application topically daily. 50 g 1  . SIMBRINZA 1-0.2 % SUSP Place 1 drop into both eyes 2 (two) times daily. Take as instructed by Dr. Katy Fitch.    . TOUJEO SOLOSTAR 300 UNIT/ML SOPN INJECT 60 UNITS INTO THE SKIN EVERY MORNING AND 90 UNITS INTO THE SKIN AT NIGHT 9 pen 0  . VICTOZA 18 MG/3ML SOPN ADMINISTER 1.8 MG UNDER THE SKIN DAILY 9 mL 3  . HYDROcodone-acetaminophen (NORCO) 7.5-325 MG tablet Take 1 tablet by mouth every 6 (six) hours as needed for moderate pain. 130 tablet 0   No current facility-administered medications on file prior to visit.     Allergies  Allergen Reactions  . Vancomycin     REACTION: ARF    Objective: There were no vitals filed for this visit.  General: No acute distress, AAOx3  Left foot: Plantar forefoot, wound limited to skin, wound bed measuring 1.0x0.6x0.3cm (smaller than previous) with mild periwound keratosis, no swelling to left foot, no erythema, no warmth, no active drainage however on inner guaze there is drainage bloody drainage with no acute signs of infection noted, Capillary fill time <3 seconds in all digits remaining, amputation status of 2nd and 3rd toes. No pain with calf compression.   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[place])    -  Primary   Diabetes mellitus due to underlying condition with diabetic polyneuropathy, unspecified whether long term insulin use ([place])       Foot amputation status, left ([place])           -Patient seen and evaluated - Excisionally dedbrided ulceration at Left plantar forefoot to healthy bleeding borders removing nonviable tissue using a sterile chisel blade. Wound measures post debridement as above. Wound was debrided to the level of the dermis with viable wound base exposed to promote healing. Hemostasis was achieved with manuel pressure. Patient  tolerated procedure well without any discomfort or anesthesia necessary for this wound debridement.  -Applied EPIFIX 86m disk reorder # [id number] 02-10-22 without waste secured with adaptic and steristrips to Left foot ulceration and dry sterile dressing  - Advised patient to go to the ER or return to office if the wound worsens or if constitutional symptoms are present. -Advised patient to make sure to keep dressings clean, dry, and intact allowing nursing to change using adaptic, steristrips, and offloading padding; Resume on Friday.  -Advised patient to continue with forefoot offloading post-op shoe on left foot with use of cane -Advised patient to limit activity to necessity  -Advised patient to rest and elevate as necessary -Patient to return for wound check and possible reapplication of EPIFIX in  2-3 weeks. In the meantime, patient to call office if any issues or problems arise.   Landis Martins, DPM

## 2017-06-10 ENCOUNTER — Telehealth: Payer: Self-pay | Admitting: Dietician

## 2017-06-10 ENCOUNTER — Encounter: Payer: Self-pay | Admitting: Dietician

## 2017-06-10 DIAGNOSIS — E11649 Type 2 diabetes mellitus with hypoglycemia without coma: Secondary | ICD-10-CM

## 2017-06-10 MED ORDER — FREESTYLE LIBRE SENSOR SYSTEM MISC: 1.0000 | Freq: Every day | 4 refills | Status: DC

## 2017-06-10 NOTE — Telephone Encounter (Signed)
Asking for prescription to get a replacement freestyle libre sensor at [facility]. [employer] sent him a coupon to get it free but pharmacy requires a prescription.

## 2017-06-11 DIAGNOSIS — N183 Chronic kidney disease, stage 3 (moderate): Secondary | ICD-10-CM | POA: Diagnosis not present

## 2017-06-11 DIAGNOSIS — I129 Hypertensive chronic kidney disease with stage 1 through stage 4 chronic kidney disease, or unspecified chronic kidney disease: Secondary | ICD-10-CM | POA: Diagnosis not present

## 2017-06-11 DIAGNOSIS — L97522 Non-pressure chronic ulcer of other part of left foot with fat layer exposed: Secondary | ICD-10-CM | POA: Diagnosis not present

## 2017-06-11 DIAGNOSIS — Z89422 Acquired absence of other left toe(s): Secondary | ICD-10-CM | POA: Diagnosis not present

## 2017-06-11 DIAGNOSIS — E1142 Type 2 diabetes mellitus with diabetic polyneuropathy: Secondary | ICD-10-CM | POA: Diagnosis not present

## 2017-06-11 DIAGNOSIS — E11621 Type 2 diabetes mellitus with foot ulcer: Secondary | ICD-10-CM | POA: Diagnosis not present

## 2017-06-11 DIAGNOSIS — Z794 Long term (current) use of insulin: Secondary | ICD-10-CM | POA: Diagnosis not present

## 2017-06-11 DIAGNOSIS — Z7901 Long term (current) use of anticoagulants: Secondary | ICD-10-CM | POA: Diagnosis not present

## 2017-06-11 DIAGNOSIS — E11311 Type 2 diabetes mellitus with unspecified diabetic retinopathy with macular edema: Secondary | ICD-10-CM | POA: Diagnosis not present

## 2017-06-11 DIAGNOSIS — Z4800 Encounter for change or removal of nonsurgical wound dressing: Secondary | ICD-10-CM | POA: Diagnosis not present

## 2017-06-11 DIAGNOSIS — E1122 Type 2 diabetes mellitus with diabetic chronic kidney disease: Secondary | ICD-10-CM | POA: Diagnosis not present

## 2017-06-14 DIAGNOSIS — Z4800 Encounter for change or removal of nonsurgical wound dressing: Secondary | ICD-10-CM | POA: Diagnosis not present

## 2017-06-14 DIAGNOSIS — E11311 Type 2 diabetes mellitus with unspecified diabetic retinopathy with macular edema: Secondary | ICD-10-CM | POA: Diagnosis not present

## 2017-06-14 DIAGNOSIS — E11621 Type 2 diabetes mellitus with foot ulcer: Secondary | ICD-10-CM | POA: Diagnosis not present

## 2017-06-14 DIAGNOSIS — N183 Chronic kidney disease, stage 3 (moderate): Secondary | ICD-10-CM | POA: Diagnosis not present

## 2017-06-14 DIAGNOSIS — L97522 Non-pressure chronic ulcer of other part of left foot with fat layer exposed: Secondary | ICD-10-CM | POA: Diagnosis not present

## 2017-06-14 DIAGNOSIS — E1142 Type 2 diabetes mellitus with diabetic polyneuropathy: Secondary | ICD-10-CM | POA: Diagnosis not present

## 2017-06-14 DIAGNOSIS — Z7901 Long term (current) use of anticoagulants: Secondary | ICD-10-CM | POA: Diagnosis not present

## 2017-06-14 DIAGNOSIS — Z794 Long term (current) use of insulin: Secondary | ICD-10-CM | POA: Diagnosis not present

## 2017-06-14 DIAGNOSIS — E1122 Type 2 diabetes mellitus with diabetic chronic kidney disease: Secondary | ICD-10-CM | POA: Diagnosis not present

## 2017-06-14 DIAGNOSIS — Z89422 Acquired absence of other left toe(s): Secondary | ICD-10-CM | POA: Diagnosis not present

## 2017-06-14 DIAGNOSIS — I129 Hypertensive chronic kidney disease with stage 1 through stage 4 chronic kidney disease, or unspecified chronic kidney disease: Secondary | ICD-10-CM | POA: Diagnosis not present

## 2017-06-14 NOTE — Telephone Encounter (Signed)
I thought I sent this in on 11/29. Do I need to reorder this?

## 2017-06-15 NOTE — Telephone Encounter (Signed)
I do not think so. I think he got what he needed.

## 2017-06-16 ENCOUNTER — Encounter: Payer: Self-pay | Admitting: Dietician

## 2017-06-16 ENCOUNTER — Other Ambulatory Visit: Payer: Self-pay | Admitting: Dietician

## 2017-06-16 DIAGNOSIS — E11621 Type 2 diabetes mellitus with foot ulcer: Secondary | ICD-10-CM | POA: Diagnosis not present

## 2017-06-16 DIAGNOSIS — Z89422 Acquired absence of other left toe(s): Secondary | ICD-10-CM | POA: Diagnosis not present

## 2017-06-16 DIAGNOSIS — E11311 Type 2 diabetes mellitus with unspecified diabetic retinopathy with macular edema: Secondary | ICD-10-CM | POA: Diagnosis not present

## 2017-06-16 DIAGNOSIS — E1142 Type 2 diabetes mellitus with diabetic polyneuropathy: Secondary | ICD-10-CM | POA: Diagnosis not present

## 2017-06-16 DIAGNOSIS — Z4800 Encounter for change or removal of nonsurgical wound dressing: Secondary | ICD-10-CM | POA: Diagnosis not present

## 2017-06-16 DIAGNOSIS — Z7901 Long term (current) use of anticoagulants: Secondary | ICD-10-CM | POA: Diagnosis not present

## 2017-06-16 DIAGNOSIS — I129 Hypertensive chronic kidney disease with stage 1 through stage 4 chronic kidney disease, or unspecified chronic kidney disease: Secondary | ICD-10-CM | POA: Diagnosis not present

## 2017-06-16 DIAGNOSIS — L97522 Non-pressure chronic ulcer of other part of left foot with fat layer exposed: Secondary | ICD-10-CM | POA: Diagnosis not present

## 2017-06-16 DIAGNOSIS — E1122 Type 2 diabetes mellitus with diabetic chronic kidney disease: Secondary | ICD-10-CM | POA: Diagnosis not present

## 2017-06-16 DIAGNOSIS — N183 Chronic kidney disease, stage 3 (moderate): Secondary | ICD-10-CM | POA: Diagnosis not present

## 2017-06-16 DIAGNOSIS — Z794 Long term (current) use of insulin: Secondary | ICD-10-CM | POA: Diagnosis not present

## 2017-06-16 MED ORDER — PROMETHAZINE HCL 25 MG PO TABS
25.0000 mg | ORAL_TABLET | Freq: Three times a day (TID) | ORAL | 0 refills | Status: DC | PRN
Start: 2017-06-16 — End: 2017-07-17

## 2017-06-16 MED ORDER — ALBUTEROL SULFATE HFA 108 (90 BASE) MCG/ACT IN AERS: 2.0000 | INHALATION_SPRAY | Freq: Four times a day (QID) | RESPIRATORY_TRACT | 3 refills | Status: DC | PRN

## 2017-06-16 NOTE — Telephone Encounter (Signed)
Mr. Kutzer requests a refill on all his medications at [facility]. ( note- he will get the [employer] sensors from [place].)

## 2017-06-17 DIAGNOSIS — E119 Type 2 diabetes mellitus without complications: Secondary | ICD-10-CM | POA: Diagnosis not present

## 2017-06-17 DIAGNOSIS — Z794 Long term (current) use of insulin: Secondary | ICD-10-CM | POA: Diagnosis not present

## 2017-06-17 DIAGNOSIS — E11621 Type 2 diabetes mellitus with foot ulcer: Secondary | ICD-10-CM | POA: Diagnosis not present

## 2017-06-17 DIAGNOSIS — L97521 Non-pressure chronic ulcer of other part of left foot limited to breakdown of skin: Secondary | ICD-10-CM | POA: Diagnosis not present

## 2017-06-18 DIAGNOSIS — E11621 Type 2 diabetes mellitus with foot ulcer: Secondary | ICD-10-CM | POA: Diagnosis not present

## 2017-06-18 DIAGNOSIS — Z4800 Encounter for change or removal of nonsurgical wound dressing: Secondary | ICD-10-CM | POA: Diagnosis not present

## 2017-06-18 DIAGNOSIS — Z89422 Acquired absence of other left toe(s): Secondary | ICD-10-CM | POA: Diagnosis not present

## 2017-06-18 DIAGNOSIS — Z794 Long term (current) use of insulin: Secondary | ICD-10-CM | POA: Diagnosis not present

## 2017-06-18 DIAGNOSIS — I129 Hypertensive chronic kidney disease with stage 1 through stage 4 chronic kidney disease, or unspecified chronic kidney disease: Secondary | ICD-10-CM | POA: Diagnosis not present

## 2017-06-18 DIAGNOSIS — E1142 Type 2 diabetes mellitus with diabetic polyneuropathy: Secondary | ICD-10-CM | POA: Diagnosis not present

## 2017-06-18 DIAGNOSIS — E1122 Type 2 diabetes mellitus with diabetic chronic kidney disease: Secondary | ICD-10-CM | POA: Diagnosis not present

## 2017-06-18 DIAGNOSIS — L97522 Non-pressure chronic ulcer of other part of left foot with fat layer exposed: Secondary | ICD-10-CM | POA: Diagnosis not present

## 2017-06-18 DIAGNOSIS — N183 Chronic kidney disease, stage 3 (moderate): Secondary | ICD-10-CM | POA: Diagnosis not present

## 2017-06-18 DIAGNOSIS — E11311 Type 2 diabetes mellitus with unspecified diabetic retinopathy with macular edema: Secondary | ICD-10-CM | POA: Diagnosis not present

## 2017-06-18 DIAGNOSIS — Z7901 Long term (current) use of anticoagulants: Secondary | ICD-10-CM | POA: Diagnosis not present

## 2017-06-21 DIAGNOSIS — G4733 Obstructive sleep apnea (adult) (pediatric): Secondary | ICD-10-CM | POA: Diagnosis not present

## 2017-06-23 DIAGNOSIS — Z4800 Encounter for change or removal of nonsurgical wound dressing: Secondary | ICD-10-CM | POA: Diagnosis not present

## 2017-06-23 DIAGNOSIS — Z7901 Long term (current) use of anticoagulants: Secondary | ICD-10-CM | POA: Diagnosis not present

## 2017-06-23 DIAGNOSIS — Z794 Long term (current) use of insulin: Secondary | ICD-10-CM | POA: Diagnosis not present

## 2017-06-23 DIAGNOSIS — E1122 Type 2 diabetes mellitus with diabetic chronic kidney disease: Secondary | ICD-10-CM | POA: Diagnosis not present

## 2017-06-23 DIAGNOSIS — I129 Hypertensive chronic kidney disease with stage 1 through stage 4 chronic kidney disease, or unspecified chronic kidney disease: Secondary | ICD-10-CM | POA: Diagnosis not present

## 2017-06-23 DIAGNOSIS — N183 Chronic kidney disease, stage 3 (moderate): Secondary | ICD-10-CM | POA: Diagnosis not present

## 2017-06-23 DIAGNOSIS — Z89422 Acquired absence of other left toe(s): Secondary | ICD-10-CM | POA: Diagnosis not present

## 2017-06-23 DIAGNOSIS — L97522 Non-pressure chronic ulcer of other part of left foot with fat layer exposed: Secondary | ICD-10-CM | POA: Diagnosis not present

## 2017-06-23 DIAGNOSIS — E11621 Type 2 diabetes mellitus with foot ulcer: Secondary | ICD-10-CM | POA: Diagnosis not present

## 2017-06-23 DIAGNOSIS — E1142 Type 2 diabetes mellitus with diabetic polyneuropathy: Secondary | ICD-10-CM | POA: Diagnosis not present

## 2017-06-23 DIAGNOSIS — E11311 Type 2 diabetes mellitus with unspecified diabetic retinopathy with macular edema: Secondary | ICD-10-CM | POA: Diagnosis not present

## 2017-06-25 DIAGNOSIS — I129 Hypertensive chronic kidney disease with stage 1 through stage 4 chronic kidney disease, or unspecified chronic kidney disease: Secondary | ICD-10-CM | POA: Diagnosis not present

## 2017-06-25 DIAGNOSIS — Z794 Long term (current) use of insulin: Secondary | ICD-10-CM | POA: Diagnosis not present

## 2017-06-25 DIAGNOSIS — E11621 Type 2 diabetes mellitus with foot ulcer: Secondary | ICD-10-CM | POA: Diagnosis not present

## 2017-06-25 DIAGNOSIS — E11311 Type 2 diabetes mellitus with unspecified diabetic retinopathy with macular edema: Secondary | ICD-10-CM | POA: Diagnosis not present

## 2017-06-25 DIAGNOSIS — E1142 Type 2 diabetes mellitus with diabetic polyneuropathy: Secondary | ICD-10-CM | POA: Diagnosis not present

## 2017-06-25 DIAGNOSIS — L97522 Non-pressure chronic ulcer of other part of left foot with fat layer exposed: Secondary | ICD-10-CM | POA: Diagnosis not present

## 2017-06-25 DIAGNOSIS — N183 Chronic kidney disease, stage 3 (moderate): Secondary | ICD-10-CM | POA: Diagnosis not present

## 2017-06-25 DIAGNOSIS — Z7901 Long term (current) use of anticoagulants: Secondary | ICD-10-CM | POA: Diagnosis not present

## 2017-06-25 DIAGNOSIS — Z89422 Acquired absence of other left toe(s): Secondary | ICD-10-CM | POA: Diagnosis not present

## 2017-06-25 DIAGNOSIS — E113512 Type 2 diabetes mellitus with proliferative diabetic retinopathy with macular edema, left eye: Secondary | ICD-10-CM | POA: Diagnosis not present

## 2017-06-25 DIAGNOSIS — E1122 Type 2 diabetes mellitus with diabetic chronic kidney disease: Secondary | ICD-10-CM | POA: Diagnosis not present

## 2017-06-25 DIAGNOSIS — Z4800 Encounter for change or removal of nonsurgical wound dressing: Secondary | ICD-10-CM | POA: Diagnosis not present

## 2017-06-28 DIAGNOSIS — I129 Hypertensive chronic kidney disease with stage 1 through stage 4 chronic kidney disease, or unspecified chronic kidney disease: Secondary | ICD-10-CM | POA: Diagnosis not present

## 2017-06-28 DIAGNOSIS — E1142 Type 2 diabetes mellitus with diabetic polyneuropathy: Secondary | ICD-10-CM | POA: Diagnosis not present

## 2017-06-28 DIAGNOSIS — E1122 Type 2 diabetes mellitus with diabetic chronic kidney disease: Secondary | ICD-10-CM | POA: Diagnosis not present

## 2017-06-28 DIAGNOSIS — Z794 Long term (current) use of insulin: Secondary | ICD-10-CM | POA: Diagnosis not present

## 2017-06-28 DIAGNOSIS — Z7901 Long term (current) use of anticoagulants: Secondary | ICD-10-CM | POA: Diagnosis not present

## 2017-06-28 DIAGNOSIS — Z4800 Encounter for change or removal of nonsurgical wound dressing: Secondary | ICD-10-CM | POA: Diagnosis not present

## 2017-06-28 DIAGNOSIS — Z89422 Acquired absence of other left toe(s): Secondary | ICD-10-CM | POA: Diagnosis not present

## 2017-06-28 DIAGNOSIS — N183 Chronic kidney disease, stage 3 (moderate): Secondary | ICD-10-CM | POA: Diagnosis not present

## 2017-06-28 DIAGNOSIS — E11311 Type 2 diabetes mellitus with unspecified diabetic retinopathy with macular edema: Secondary | ICD-10-CM | POA: Diagnosis not present

## 2017-06-28 DIAGNOSIS — E11621 Type 2 diabetes mellitus with foot ulcer: Secondary | ICD-10-CM | POA: Diagnosis not present

## 2017-06-28 DIAGNOSIS — L97522 Non-pressure chronic ulcer of other part of left foot with fat layer exposed: Secondary | ICD-10-CM | POA: Diagnosis not present

## 2017-06-29 ENCOUNTER — Telehealth: Payer: Self-pay | Admitting: *Deleted

## 2017-06-29 ENCOUNTER — Ambulatory Visit (INDEPENDENT_AMBULATORY_CARE_PROVIDER_SITE_OTHER): Payer: Medicare Other | Admitting: Sports Medicine

## 2017-06-29 ENCOUNTER — Encounter: Payer: Self-pay | Admitting: Sports Medicine

## 2017-06-29 VITALS — BP 127/77 | HR 67 | Resp 16

## 2017-06-29 DIAGNOSIS — I739 Peripheral vascular disease, unspecified: Secondary | ICD-10-CM

## 2017-06-29 DIAGNOSIS — IMO0002 Reserved for concepts with insufficient information to code with codable children: Secondary | ICD-10-CM

## 2017-06-29 DIAGNOSIS — L97522 Non-pressure chronic ulcer of other part of left foot with fat layer exposed: Secondary | ICD-10-CM | POA: Diagnosis not present

## 2017-06-29 DIAGNOSIS — Z89432 Acquired absence of left foot: Secondary | ICD-10-CM

## 2017-06-29 DIAGNOSIS — E0842 Diabetes mellitus due to underlying condition with diabetic polyneuropathy: Secondary | ICD-10-CM

## 2017-06-29 NOTE — Progress Notes (Signed)
Subjective: [name] is a 66 y.o. male patient seen today in office for POV #33 (DOS 06-15-16), S/P Left wound debridement and placement of stravix allograft. Patient is using regranex with stalling and is HERE TODAY FOR ANOTHER EPIFIX GRAFT APPLICATON, denies pain at surgical site, states that when he does have pain 4-5/10, unchanged from prior; sees pain management, denies calf pain, denies headache, chest pain, shortness of breath, nausea, vomiting, fever, or chills. Patient states that he is doing well with blood sugars on pump, last reading of FBS this AM was 160 and A1c 8. No other issues noted.   On Eliquis   Patient Active Problem List   Diagnosis Date Noted  . Chronic pain syndrome 09/18/2015  . Preventative health care 03/12/2015  . Foot ulcer, left ([place]) 12/13/2014  . Paroxysmal atrial fibrillation ([place]) 12/13/2014  . Elevated alkaline phosphatase level 06/11/2014  . Constipation 05/31/2014  . Vitamin D deficiency 11/17/2013  . Anemia 02/10/2012  . Hypertension 11/18/2011  . Obstructive sleep apnea 12/12/2009  . GERD 06/27/2008  . Diabetes type 2, uncontrolled ([place]) 12/28/2007  . DIABETIC MACULAR EDEMA 10/07/2007  . Dyslipidemia 08/15/2007  . BACKGROUND DIABETIC RETINOPATHY 08/15/2007  . HEARING LOSS, SENSORINEURAL, BILATERAL 01/03/2007  . Diabetic peripheral neuropathy associated with type 2 diabetes mellitus ([place]) 12/16/2006  . Chronic kidney disease, stage III (moderate) ([place]) 12/01/2006  . Morbid obesity ([place]) 08/23/2006  . STATUS, OTHER TOE(S) AMPUTATION 05/21/2006    Current Outpatient Medications on File Prior to Visit  Medication Sig Dispense Refill  . ACCU-CHEK FASTCLIX LANCETS MISC Check blood sugar 5 times a day 408 each 3  . albuterol (PROAIR HFA) 108 (90 Base) MCG/ACT inhaler Inhale 2 puffs into the lungs every 6 (six) hours as needed for wheezing or shortness of breath. 24.7 g 3  . atorvastatin (LIPITOR) 20 MG tablet TAKE 1 TABLET(20 MG) BY  MOUTH DAILY 90 tablet 1  . becaplermin (REGRANEX) 0.01 % gel Apply 1 application topically daily. 15 g 5  . Blood Glucose Monitoring Suppl (ACCU-CHEK AVIVA PLUS) w/Device KIT CHECK BLOOD SUGAR 5 TIMES A DAY 1 kit 0  . Cholecalciferol (VITAMIN D3) 2000 UNITS capsule Take 1 capsule (2,000 Units total) by mouth daily. 90 capsule 0  . Continuous Blood Gluc Sensor (FREESTYLE LIBRE SENSOR SYSTEM) MISC 1 each by Does not apply route 6 (six) times daily. 1 each 4  . diclofenac sodium (VOLTAREN) 1 % GEL Apply 4 g topically 4 (four) times daily. 100 g 2  . docusate sodium (COLACE) 100 MG capsule Take 1 capsule (100 mg total) by mouth daily as needed for mild constipation. 14 capsule 0  . ELIQUIS 5 MG TABS tablet TAKE 1 TABLET(5 MG) BY MOUTH TWICE DAILY 180 tablet 3  . enalapril (VASOTEC) 20 MG tablet TAKE 2 TABLETS(40 MG) BY MOUTH DAILY 180 tablet 0  . furosemide (LASIX) 40 MG tablet TAKE 1 TABLET(40 MG) BY MOUTH TWICE DAILY 60 tablet 3  . gabapentin (NEURONTIN) 300 MG capsule TAKE 1 CAPSULE(300 MG) BY MOUTH AT BEDTIME 90 capsule 3  . glucose blood (ACCU-CHEK AVIVA PLUS) test strip USE TO CHECK BLOOD Five TIMES DAILY 450 each 3  . HUMULIN R 500 UNIT/ML injection DRAW INSULIN TO THE 68 UNIT LINE ON THE U-100 SYRINGE(TO USE 340 UNITS) PER DAY AS DIRECTED 20 mL 3  . Insulin Disposable Pump (V-GO 20) KIT Use 1 pimp daily with insulin 30 kit 4  . Insulin Pen Needle 31G X 5 MM MISC  Use for insulin injection 5 times a day. 150 each 5  . insulin regular human CONCENTRATED (HUMULIN R U-500 KWIKPEN) 500 UNIT/ML kwikpen INJECT 35 UNITS AT BREAKFAST, 60 UNITS AT LUNCH, AND 70 UNITS AT DINNER 20 mL 2  . INSULIN SYRINGE 1CC/29G 29G X 1/2" 1 ML MISC USE DAILY AS DIRECTED 100 each 0  . Insulin Syringe-Needle U-100 25G X 1" 1 ML MISC Use 1 syringe daily to fill V-Go pump 30 each 3  . metoprolol succinate (TOPROL-XL) 25 MG 24 hr tablet TAKE 1 TABLET(25 MG) BY MOUTH DAILY 90 tablet 0  . omeprazole (PRILOSEC) 20 MG capsule  TAKE 1 CAPSULE(20 MG) BY MOUTH DAILY 90 capsule 0  . promethazine (PHENERGAN) 25 MG tablet Take 1 tablet (25 mg total) by mouth every 8 (eight) hours as needed for nausea or vomiting. 30 tablet 0  . silver sulfADIAZINE (SILVADENE) 1 % cream Apply 1 application topically daily. 50 g 1  . SIMBRINZA 1-0.2 % SUSP Place 1 drop into both eyes 2 (two) times daily. Take as instructed by Dr. Katy Fitch.    . TOUJEO SOLOSTAR 300 UNIT/ML SOPN INJECT 60 UNITS INTO THE SKIN EVERY MORNING AND 90 UNITS INTO THE SKIN AT NIGHT 9 pen 0  . VICTOZA 18 MG/3ML SOPN ADMINISTER 1.8 MG UNDER THE SKIN DAILY 9 mL 3  . HYDROcodone-acetaminophen (NORCO) 7.5-325 MG tablet Take 1 tablet by mouth every 6 (six) hours as needed for moderate pain. 130 tablet 0   No current facility-administered medications on file prior to visit.     Allergies  Allergen Reactions  . Vancomycin     REACTION: ARF    Objective: There were no vitals filed for this visit.  General: No acute distress, AAOx3  Left foot: Plantar forefoot, wound limited to skin, wound bed measuring 0.9x0.6x0.3cm (smaller than previous) with mild periwound keratosis, no swelling to left foot, no erythema, no warmth, no active drainage however on inner guaze there is drainage bloody drainage with no acute signs of infection noted, Capillary fill time <3 seconds in all digits remaining, amputation status of 2nd and 3rd toes. No pain with calf compression.   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[place])    -  Primary   Diabetes mellitus due to underlying condition with diabetic polyneuropathy, unspecified whether long term insulin use ([place])       Foot amputation status, left (HCC)       PVD (peripheral vascular disease) ([place])           -Patient seen and evaluated - Excisionally dedbrided ulceration at Left plantar forefoot to healthy bleeding borders removing nonviable  tissue using a sterile chisel blade. Wound measures post debridement as above. Wound was debrided to the level of the dermis with viable wound base exposed to promote healing. Hemostasis was achieved with manuel pressure. Patient tolerated procedure well without any discomfort or anesthesia necessary for this wound debridement.  -Applied EPIFIX 85m disk reorder # [id number] 02-10-22 without waste, this is graft #2, secured with adaptic and steristrips to Left foot ulceration and dry sterile dressing  - Advised patient to go to the ER or return to office if the wound worsens or if constitutional symptoms are present. -Advised patient to make sure to keep dressings clean, dry, and intact allowing nursing to change using adaptic, steristrips, and offloading padding;  Resume on Friday.  -Advised patient to continue with forefoot offloading post-op shoe on left foot with use of cane -Advised patient to limit activity to necessity  -Advised patient to rest and elevate as necessary -Patient to return for wound check and possible reapplication of EPIFIX in 2-3 weeks. In the meantime, patient to call office if any issues or problems arise.   Landis Martins, DPM

## 2017-06-29 NOTE — Telephone Encounter (Signed)
Emailed order for 3rd EpiFix 84mm for 07/20/2017 from [place].

## 2017-06-29 NOTE — Telephone Encounter (Signed)
-----   Message from Landis Martins, [place] sent at 06/29/2017 11:47 AM EST ----- Regarding: Reorder and Nursing schedule  Reorder Epifix 22mm disk  Also let home nursing to continue current wound care and for the week that he comes to see me in office to wait at least 2-3 days for next dressing change to allow time for the graft to incorporate Thanks Dr. Cannon Kettle

## 2017-06-30 NOTE — Telephone Encounter (Signed)
Faxed Dr. Leeanne Rio 06/29/2017 11:47am orders to [facility].

## 2017-07-01 ENCOUNTER — Ambulatory Visit: Payer: Medicare Other | Admitting: Dietician

## 2017-07-02 ENCOUNTER — Other Ambulatory Visit: Payer: Self-pay | Admitting: Internal Medicine

## 2017-07-02 DIAGNOSIS — Z794 Long term (current) use of insulin: Secondary | ICD-10-CM | POA: Diagnosis not present

## 2017-07-02 DIAGNOSIS — G894 Chronic pain syndrome: Secondary | ICD-10-CM | POA: Diagnosis not present

## 2017-07-02 DIAGNOSIS — Z7901 Long term (current) use of anticoagulants: Secondary | ICD-10-CM | POA: Diagnosis not present

## 2017-07-02 DIAGNOSIS — M79672 Pain in left foot: Secondary | ICD-10-CM | POA: Diagnosis not present

## 2017-07-02 DIAGNOSIS — E11311 Type 2 diabetes mellitus with unspecified diabetic retinopathy with macular edema: Secondary | ICD-10-CM | POA: Diagnosis not present

## 2017-07-02 DIAGNOSIS — N183 Chronic kidney disease, stage 3 (moderate): Secondary | ICD-10-CM | POA: Diagnosis not present

## 2017-07-02 DIAGNOSIS — E1122 Type 2 diabetes mellitus with diabetic chronic kidney disease: Secondary | ICD-10-CM | POA: Diagnosis not present

## 2017-07-02 DIAGNOSIS — Z89422 Acquired absence of other left toe(s): Secondary | ICD-10-CM | POA: Diagnosis not present

## 2017-07-02 DIAGNOSIS — I129 Hypertensive chronic kidney disease with stage 1 through stage 4 chronic kidney disease, or unspecified chronic kidney disease: Secondary | ICD-10-CM | POA: Diagnosis not present

## 2017-07-02 DIAGNOSIS — M545 Low back pain: Secondary | ICD-10-CM | POA: Diagnosis not present

## 2017-07-02 DIAGNOSIS — Z4800 Encounter for change or removal of nonsurgical wound dressing: Secondary | ICD-10-CM | POA: Diagnosis not present

## 2017-07-02 DIAGNOSIS — Z79891 Long term (current) use of opiate analgesic: Secondary | ICD-10-CM | POA: Diagnosis not present

## 2017-07-02 DIAGNOSIS — E1142 Type 2 diabetes mellitus with diabetic polyneuropathy: Secondary | ICD-10-CM | POA: Diagnosis not present

## 2017-07-02 DIAGNOSIS — E11621 Type 2 diabetes mellitus with foot ulcer: Secondary | ICD-10-CM | POA: Diagnosis not present

## 2017-07-02 DIAGNOSIS — L97522 Non-pressure chronic ulcer of other part of left foot with fat layer exposed: Secondary | ICD-10-CM | POA: Diagnosis not present

## 2017-07-05 DIAGNOSIS — Z7901 Long term (current) use of anticoagulants: Secondary | ICD-10-CM | POA: Diagnosis not present

## 2017-07-05 DIAGNOSIS — E1122 Type 2 diabetes mellitus with diabetic chronic kidney disease: Secondary | ICD-10-CM | POA: Diagnosis not present

## 2017-07-05 DIAGNOSIS — E1142 Type 2 diabetes mellitus with diabetic polyneuropathy: Secondary | ICD-10-CM | POA: Diagnosis not present

## 2017-07-05 DIAGNOSIS — H04123 Dry eye syndrome of bilateral lacrimal glands: Secondary | ICD-10-CM | POA: Diagnosis not present

## 2017-07-05 DIAGNOSIS — I129 Hypertensive chronic kidney disease with stage 1 through stage 4 chronic kidney disease, or unspecified chronic kidney disease: Secondary | ICD-10-CM | POA: Diagnosis not present

## 2017-07-05 DIAGNOSIS — E11621 Type 2 diabetes mellitus with foot ulcer: Secondary | ICD-10-CM | POA: Diagnosis not present

## 2017-07-05 DIAGNOSIS — N183 Chronic kidney disease, stage 3 (moderate): Secondary | ICD-10-CM | POA: Diagnosis not present

## 2017-07-05 DIAGNOSIS — Z89422 Acquired absence of other left toe(s): Secondary | ICD-10-CM | POA: Diagnosis not present

## 2017-07-05 DIAGNOSIS — L97522 Non-pressure chronic ulcer of other part of left foot with fat layer exposed: Secondary | ICD-10-CM | POA: Diagnosis not present

## 2017-07-05 DIAGNOSIS — E11311 Type 2 diabetes mellitus with unspecified diabetic retinopathy with macular edema: Secondary | ICD-10-CM | POA: Diagnosis not present

## 2017-07-05 DIAGNOSIS — Z4800 Encounter for change or removal of nonsurgical wound dressing: Secondary | ICD-10-CM | POA: Diagnosis not present

## 2017-07-05 DIAGNOSIS — Z794 Long term (current) use of insulin: Secondary | ICD-10-CM | POA: Diagnosis not present

## 2017-07-07 DIAGNOSIS — I129 Hypertensive chronic kidney disease with stage 1 through stage 4 chronic kidney disease, or unspecified chronic kidney disease: Secondary | ICD-10-CM | POA: Diagnosis not present

## 2017-07-07 DIAGNOSIS — N183 Chronic kidney disease, stage 3 (moderate): Secondary | ICD-10-CM | POA: Diagnosis not present

## 2017-07-07 DIAGNOSIS — E11621 Type 2 diabetes mellitus with foot ulcer: Secondary | ICD-10-CM | POA: Diagnosis not present

## 2017-07-07 DIAGNOSIS — Z7901 Long term (current) use of anticoagulants: Secondary | ICD-10-CM | POA: Diagnosis not present

## 2017-07-07 DIAGNOSIS — E1122 Type 2 diabetes mellitus with diabetic chronic kidney disease: Secondary | ICD-10-CM | POA: Diagnosis not present

## 2017-07-07 DIAGNOSIS — Z89422 Acquired absence of other left toe(s): Secondary | ICD-10-CM | POA: Diagnosis not present

## 2017-07-07 DIAGNOSIS — E11311 Type 2 diabetes mellitus with unspecified diabetic retinopathy with macular edema: Secondary | ICD-10-CM | POA: Diagnosis not present

## 2017-07-07 DIAGNOSIS — E1142 Type 2 diabetes mellitus with diabetic polyneuropathy: Secondary | ICD-10-CM | POA: Diagnosis not present

## 2017-07-07 DIAGNOSIS — L97522 Non-pressure chronic ulcer of other part of left foot with fat layer exposed: Secondary | ICD-10-CM | POA: Diagnosis not present

## 2017-07-07 DIAGNOSIS — Z794 Long term (current) use of insulin: Secondary | ICD-10-CM | POA: Diagnosis not present

## 2017-07-07 DIAGNOSIS — Z4800 Encounter for change or removal of nonsurgical wound dressing: Secondary | ICD-10-CM | POA: Diagnosis not present

## 2017-07-08 ENCOUNTER — Telehealth: Payer: Self-pay | Admitting: Dietician

## 2017-07-08 NOTE — Telephone Encounter (Signed)
[name] calls to ask how he can get a handicap sticker for his son's car since he will be bringing him to his appointments. He says it is too far for him to walk with his foot problems. He was directed to speak to our [employer] in medical records next week.

## 2017-07-09 DIAGNOSIS — E11621 Type 2 diabetes mellitus with foot ulcer: Secondary | ICD-10-CM | POA: Diagnosis not present

## 2017-07-09 DIAGNOSIS — E11311 Type 2 diabetes mellitus with unspecified diabetic retinopathy with macular edema: Secondary | ICD-10-CM | POA: Diagnosis not present

## 2017-07-09 DIAGNOSIS — Z89422 Acquired absence of other left toe(s): Secondary | ICD-10-CM | POA: Diagnosis not present

## 2017-07-09 DIAGNOSIS — Z7901 Long term (current) use of anticoagulants: Secondary | ICD-10-CM | POA: Diagnosis not present

## 2017-07-09 DIAGNOSIS — Z4800 Encounter for change or removal of nonsurgical wound dressing: Secondary | ICD-10-CM | POA: Diagnosis not present

## 2017-07-09 DIAGNOSIS — I129 Hypertensive chronic kidney disease with stage 1 through stage 4 chronic kidney disease, or unspecified chronic kidney disease: Secondary | ICD-10-CM | POA: Diagnosis not present

## 2017-07-09 DIAGNOSIS — L97522 Non-pressure chronic ulcer of other part of left foot with fat layer exposed: Secondary | ICD-10-CM | POA: Diagnosis not present

## 2017-07-09 DIAGNOSIS — E113311 Type 2 diabetes mellitus with moderate nonproliferative diabetic retinopathy with macular edema, right eye: Secondary | ICD-10-CM | POA: Diagnosis not present

## 2017-07-09 DIAGNOSIS — E1142 Type 2 diabetes mellitus with diabetic polyneuropathy: Secondary | ICD-10-CM | POA: Diagnosis not present

## 2017-07-09 DIAGNOSIS — E1122 Type 2 diabetes mellitus with diabetic chronic kidney disease: Secondary | ICD-10-CM | POA: Diagnosis not present

## 2017-07-09 DIAGNOSIS — Z794 Long term (current) use of insulin: Secondary | ICD-10-CM | POA: Diagnosis not present

## 2017-07-09 DIAGNOSIS — N183 Chronic kidney disease, stage 3 (moderate): Secondary | ICD-10-CM | POA: Diagnosis not present

## 2017-07-12 DIAGNOSIS — I129 Hypertensive chronic kidney disease with stage 1 through stage 4 chronic kidney disease, or unspecified chronic kidney disease: Secondary | ICD-10-CM | POA: Diagnosis not present

## 2017-07-12 DIAGNOSIS — Z89422 Acquired absence of other left toe(s): Secondary | ICD-10-CM | POA: Diagnosis not present

## 2017-07-12 DIAGNOSIS — E1122 Type 2 diabetes mellitus with diabetic chronic kidney disease: Secondary | ICD-10-CM | POA: Diagnosis not present

## 2017-07-12 DIAGNOSIS — L97522 Non-pressure chronic ulcer of other part of left foot with fat layer exposed: Secondary | ICD-10-CM | POA: Diagnosis not present

## 2017-07-12 DIAGNOSIS — Z794 Long term (current) use of insulin: Secondary | ICD-10-CM | POA: Diagnosis not present

## 2017-07-12 DIAGNOSIS — E11621 Type 2 diabetes mellitus with foot ulcer: Secondary | ICD-10-CM | POA: Diagnosis not present

## 2017-07-12 DIAGNOSIS — E1142 Type 2 diabetes mellitus with diabetic polyneuropathy: Secondary | ICD-10-CM | POA: Diagnosis not present

## 2017-07-12 DIAGNOSIS — Z4800 Encounter for change or removal of nonsurgical wound dressing: Secondary | ICD-10-CM | POA: Diagnosis not present

## 2017-07-12 DIAGNOSIS — E11311 Type 2 diabetes mellitus with unspecified diabetic retinopathy with macular edema: Secondary | ICD-10-CM | POA: Diagnosis not present

## 2017-07-12 DIAGNOSIS — Z7901 Long term (current) use of anticoagulants: Secondary | ICD-10-CM | POA: Diagnosis not present

## 2017-07-12 DIAGNOSIS — N183 Chronic kidney disease, stage 3 (moderate): Secondary | ICD-10-CM | POA: Diagnosis not present

## 2017-07-14 DIAGNOSIS — Z7901 Long term (current) use of anticoagulants: Secondary | ICD-10-CM | POA: Diagnosis not present

## 2017-07-14 DIAGNOSIS — E1122 Type 2 diabetes mellitus with diabetic chronic kidney disease: Secondary | ICD-10-CM | POA: Diagnosis not present

## 2017-07-14 DIAGNOSIS — E1142 Type 2 diabetes mellitus with diabetic polyneuropathy: Secondary | ICD-10-CM | POA: Diagnosis not present

## 2017-07-14 DIAGNOSIS — E11311 Type 2 diabetes mellitus with unspecified diabetic retinopathy with macular edema: Secondary | ICD-10-CM | POA: Diagnosis not present

## 2017-07-14 DIAGNOSIS — Z89422 Acquired absence of other left toe(s): Secondary | ICD-10-CM | POA: Diagnosis not present

## 2017-07-14 DIAGNOSIS — N183 Chronic kidney disease, stage 3 (moderate): Secondary | ICD-10-CM | POA: Diagnosis not present

## 2017-07-14 DIAGNOSIS — L97522 Non-pressure chronic ulcer of other part of left foot with fat layer exposed: Secondary | ICD-10-CM | POA: Diagnosis not present

## 2017-07-14 DIAGNOSIS — Z794 Long term (current) use of insulin: Secondary | ICD-10-CM | POA: Diagnosis not present

## 2017-07-14 DIAGNOSIS — I129 Hypertensive chronic kidney disease with stage 1 through stage 4 chronic kidney disease, or unspecified chronic kidney disease: Secondary | ICD-10-CM | POA: Diagnosis not present

## 2017-07-14 DIAGNOSIS — Z4800 Encounter for change or removal of nonsurgical wound dressing: Secondary | ICD-10-CM | POA: Diagnosis not present

## 2017-07-14 DIAGNOSIS — E11621 Type 2 diabetes mellitus with foot ulcer: Secondary | ICD-10-CM | POA: Diagnosis not present

## 2017-07-14 NOTE — Telephone Encounter (Signed)
Patient has been notified and will come in and complete the DMV Form.

## 2017-07-14 NOTE — Telephone Encounter (Signed)
Spoke with the patient and he states he rides [employer] transportation but they will not take him to his Pain Clinic Appointment which has moved to [facility].  Patient is requesting a DMV PLACARD form to be completed for his son's car as he takes him to these appointments.

## 2017-07-14 NOTE — Telephone Encounter (Signed)
OK 

## 2017-07-15 DIAGNOSIS — G4733 Obstructive sleep apnea (adult) (pediatric): Secondary | ICD-10-CM | POA: Diagnosis not present

## 2017-07-16 ENCOUNTER — Ambulatory Visit: Payer: Medicare Other | Admitting: Dietician

## 2017-07-16 DIAGNOSIS — Z794 Long term (current) use of insulin: Secondary | ICD-10-CM | POA: Diagnosis not present

## 2017-07-16 DIAGNOSIS — L97522 Non-pressure chronic ulcer of other part of left foot with fat layer exposed: Secondary | ICD-10-CM | POA: Diagnosis not present

## 2017-07-16 DIAGNOSIS — Z4800 Encounter for change or removal of nonsurgical wound dressing: Secondary | ICD-10-CM | POA: Diagnosis not present

## 2017-07-16 DIAGNOSIS — Z89422 Acquired absence of other left toe(s): Secondary | ICD-10-CM | POA: Diagnosis not present

## 2017-07-16 DIAGNOSIS — I129 Hypertensive chronic kidney disease with stage 1 through stage 4 chronic kidney disease, or unspecified chronic kidney disease: Secondary | ICD-10-CM | POA: Diagnosis not present

## 2017-07-16 DIAGNOSIS — E11621 Type 2 diabetes mellitus with foot ulcer: Secondary | ICD-10-CM | POA: Diagnosis not present

## 2017-07-16 DIAGNOSIS — Z7901 Long term (current) use of anticoagulants: Secondary | ICD-10-CM | POA: Diagnosis not present

## 2017-07-16 DIAGNOSIS — E1142 Type 2 diabetes mellitus with diabetic polyneuropathy: Secondary | ICD-10-CM | POA: Diagnosis not present

## 2017-07-16 DIAGNOSIS — E1122 Type 2 diabetes mellitus with diabetic chronic kidney disease: Secondary | ICD-10-CM | POA: Diagnosis not present

## 2017-07-16 DIAGNOSIS — E11311 Type 2 diabetes mellitus with unspecified diabetic retinopathy with macular edema: Secondary | ICD-10-CM | POA: Diagnosis not present

## 2017-07-16 DIAGNOSIS — N183 Chronic kidney disease, stage 3 (moderate): Secondary | ICD-10-CM | POA: Diagnosis not present

## 2017-07-17 ENCOUNTER — Other Ambulatory Visit: Payer: Self-pay | Admitting: Internal Medicine

## 2017-07-19 DIAGNOSIS — E1142 Type 2 diabetes mellitus with diabetic polyneuropathy: Secondary | ICD-10-CM | POA: Diagnosis not present

## 2017-07-19 DIAGNOSIS — Z89422 Acquired absence of other left toe(s): Secondary | ICD-10-CM | POA: Diagnosis not present

## 2017-07-19 DIAGNOSIS — Z4800 Encounter for change or removal of nonsurgical wound dressing: Secondary | ICD-10-CM | POA: Diagnosis not present

## 2017-07-19 DIAGNOSIS — N183 Chronic kidney disease, stage 3 (moderate): Secondary | ICD-10-CM | POA: Diagnosis not present

## 2017-07-19 DIAGNOSIS — Z794 Long term (current) use of insulin: Secondary | ICD-10-CM | POA: Diagnosis not present

## 2017-07-19 DIAGNOSIS — I129 Hypertensive chronic kidney disease with stage 1 through stage 4 chronic kidney disease, or unspecified chronic kidney disease: Secondary | ICD-10-CM | POA: Diagnosis not present

## 2017-07-19 DIAGNOSIS — E1122 Type 2 diabetes mellitus with diabetic chronic kidney disease: Secondary | ICD-10-CM | POA: Diagnosis not present

## 2017-07-19 DIAGNOSIS — E11621 Type 2 diabetes mellitus with foot ulcer: Secondary | ICD-10-CM | POA: Diagnosis not present

## 2017-07-19 DIAGNOSIS — E11311 Type 2 diabetes mellitus with unspecified diabetic retinopathy with macular edema: Secondary | ICD-10-CM | POA: Diagnosis not present

## 2017-07-19 DIAGNOSIS — L97522 Non-pressure chronic ulcer of other part of left foot with fat layer exposed: Secondary | ICD-10-CM | POA: Diagnosis not present

## 2017-07-19 DIAGNOSIS — Z7901 Long term (current) use of anticoagulants: Secondary | ICD-10-CM | POA: Diagnosis not present

## 2017-07-20 ENCOUNTER — Encounter: Payer: Self-pay | Admitting: Sports Medicine

## 2017-07-20 ENCOUNTER — Ambulatory Visit (INDEPENDENT_AMBULATORY_CARE_PROVIDER_SITE_OTHER): Payer: Medicare Other | Admitting: Sports Medicine

## 2017-07-20 VITALS — BP 136/71 | HR 75

## 2017-07-20 DIAGNOSIS — L97522 Non-pressure chronic ulcer of other part of left foot with fat layer exposed: Secondary | ICD-10-CM | POA: Diagnosis not present

## 2017-07-20 DIAGNOSIS — Z89432 Acquired absence of left foot: Secondary | ICD-10-CM

## 2017-07-20 DIAGNOSIS — I739 Peripheral vascular disease, unspecified: Secondary | ICD-10-CM

## 2017-07-20 DIAGNOSIS — E0842 Diabetes mellitus due to underlying condition with diabetic polyneuropathy: Secondary | ICD-10-CM

## 2017-07-20 DIAGNOSIS — IMO0002 Reserved for concepts with insufficient information to code with codable children: Secondary | ICD-10-CM

## 2017-07-20 NOTE — Progress Notes (Signed)
Subjective: [name] is a 67 y.o. male patient seen today in office for POV #34 (DOS 06-15-16), S/P Left wound debridement and placement of stravix allograft. Patient is HERE TODAY FOR ANOTHER EPIFIX GRAFT APPLICATON, denies pain at surgical site, states he dealing with episodes of dizziness will discuss with PCP, denies calf pain, denies headache, chest pain, shortness of breath, nausea, vomiting, fever, or chills. No other issues noted.   FBS on insulin pump   On Eliquis   Patient Active Problem List   Diagnosis Date Noted  . Chronic pain syndrome 09/18/2015  . Preventative health care 03/12/2015  . Foot ulcer, left ([place]) 12/13/2014  . Paroxysmal atrial fibrillation ([place]) 12/13/2014  . Elevated alkaline phosphatase level 06/11/2014  . Constipation 05/31/2014  . Vitamin D deficiency 11/17/2013  . Anemia 02/10/2012  . Hypertension 11/18/2011  . Obstructive sleep apnea 12/12/2009  . GERD 06/27/2008  . Diabetes type 2, uncontrolled ([place]) 12/28/2007  . DIABETIC MACULAR EDEMA 10/07/2007  . Dyslipidemia 08/15/2007  . BACKGROUND DIABETIC RETINOPATHY 08/15/2007  . HEARING LOSS, SENSORINEURAL, BILATERAL 01/03/2007  . Diabetic peripheral neuropathy associated with type 2 diabetes mellitus ([place]) 12/16/2006  . Chronic kidney disease, stage III (moderate) ([place]) 12/01/2006  . Morbid obesity ([place]) 08/23/2006  . STATUS, OTHER TOE(S) AMPUTATION 05/21/2006    Current Outpatient Medications on File Prior to Visit  Medication Sig Dispense Refill  . ACCU-CHEK FASTCLIX LANCETS MISC Check blood sugar 5 times a day 408 each 3  . albuterol (PROAIR HFA) 108 (90 Base) MCG/ACT inhaler Inhale 2 puffs into the lungs every 6 (six) hours as needed for wheezing or shortness of breath. 24.7 g 3  . atorvastatin (LIPITOR) 20 MG tablet TAKE 1 TABLET(20 MG) BY MOUTH DAILY 90 tablet 1  . becaplermin (REGRANEX) 0.01 % gel Apply 1 application topically daily. 15 g 5  . Blood Glucose Monitoring Suppl  (ACCU-CHEK AVIVA PLUS) w/Device KIT CHECK BLOOD SUGAR 5 TIMES A DAY 1 kit 0  . Cholecalciferol (VITAMIN D3) 2000 UNITS capsule Take 1 capsule (2,000 Units total) by mouth daily. 90 capsule 0  . Continuous Blood Gluc Sensor (FREESTYLE LIBRE SENSOR SYSTEM) MISC 1 each by Does not apply route 6 (six) times daily. 1 each 4  . diclofenac sodium (VOLTAREN) 1 % GEL Apply 4 g topically 4 (four) times daily. 100 g 2  . docusate sodium (COLACE) 100 MG capsule Take 1 capsule (100 mg total) by mouth daily as needed for mild constipation. 14 capsule 0  . ELIQUIS 5 MG TABS tablet TAKE 1 TABLET(5 MG) BY MOUTH TWICE DAILY 180 tablet 3  . enalapril (VASOTEC) 20 MG tablet TAKE 2 TABLETS(40 MG) BY MOUTH DAILY 180 tablet 0  . furosemide (LASIX) 40 MG tablet TAKE 1 TABLET(40 MG) BY MOUTH TWICE DAILY 60 tablet 3  . gabapentin (NEURONTIN) 300 MG capsule TAKE 1 CAPSULE(300 MG) BY MOUTH AT BEDTIME 90 capsule 3  . glucose blood (ACCU-CHEK AVIVA PLUS) test strip USE TO CHECK BLOOD Five TIMES DAILY 450 each 3  . HUMULIN R 500 UNIT/ML injection DRAW INSULIN TO THE 68 UNIT LINE ON THE U-100 SYRINGE(TO USE 340 UNITS) PER DAY AS DIRECTED 20 mL 3  . HYDROcodone-acetaminophen (NORCO) 7.5-325 MG tablet Take 1 tablet by mouth every 6 (six) hours as needed for moderate pain. 130 tablet 0  . Insulin Disposable Pump (V-GO 20) KIT Use 1 pimp daily with insulin 30 kit 4  . Insulin Pen Needle 31G X 5 MM MISC  Use for insulin injection 5 times a day. 150 each 5  . insulin regular human CONCENTRATED (HUMULIN R U-500 KWIKPEN) 500 UNIT/ML kwikpen INJECT 35 UNITS AT BREAKFAST, 60 UNITS AT LUNCH, AND 70 UNITS AT DINNER 20 mL 2  . INSULIN SYRINGE 1CC/29G 29G X 1/2" 1 ML MISC USE DAILY AS DIRECTED 100 each 0  . Insulin Syringe-Needle U-100 25G X 1" 1 ML MISC Use 1 syringe daily to fill V-Go pump 30 each 3  . metoprolol succinate (TOPROL-XL) 25 MG 24 hr tablet TAKE 1 TABLET(25 MG) BY MOUTH DAILY 90 tablet 0  . omeprazole (PRILOSEC) 20 MG capsule  TAKE 1 CAPSULE(20 MG) BY MOUTH DAILY 90 capsule 0  . promethazine (PHENERGAN) 25 MG tablet TAKE 1 TABLET(25 MG) BY MOUTH EVERY 8 HOURS AS NEEDED FOR NAUSEA OR VOMITING 30 tablet 0  . silver sulfADIAZINE (SILVADENE) 1 % cream Apply 1 application topically daily. 50 g 1  . SIMBRINZA 1-0.2 % SUSP Place 1 drop into both eyes 2 (two) times daily. Take as instructed by Dr. Katy Fitch.    . TOUJEO SOLOSTAR 300 UNIT/ML SOPN INJECT 60 UNITS INTO THE SKIN EVERY MORNING AND 90 UNITS INTO THE SKIN AT NIGHT 9 pen 0  . VICTOZA 18 MG/3ML SOPN ADMINISTER 1.8 MG UNDER THE SKIN DAILY 9 mL 3   No current facility-administered medications on file prior to visit.     Allergies  Allergen Reactions  . Vancomycin     REACTION: ARF    Objective: There were no vitals filed for this visit.  General: No acute distress, AAOx3  Left foot: Plantar forefoot, wound limited to skin, wound bed measuring 1x0.3x0.3cm (smaller than previous) with mild periwound keratosis, no swelling to left foot, no erythema, no warmth, no active drainage however on inner guaze there is drainage bloody drainage with no acute signs of infection noted, Capillary fill time <3 seconds in all digits remaining, amputation status of 2nd and 3rd toes. No pain with calf compression.   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[place])    -  Primary   Diabetes mellitus due to underlying condition with diabetic polyneuropathy, unspecified whether long term insulin use ([place])       Foot amputation status, left (HCC)       PVD (peripheral vascular disease) ([place])           -Patient seen and evaluated - Excisionally dedbrided ulceration at Left plantar forefoot to healthy bleeding borders removing nonviable tissue using a sterile chisel blade. Wound measures post debridement as above. Wound was debrided to the level of the dermis with viable wound base exposed to  promote healing. Hemostasis was achieved with manuel pressure. Patient tolerated procedure well without any discomfort or anesthesia necessary for this wound debridement.  -Applied EPIFIX 36m disk reorder # [id number] 02-10-22 without waste, this is graft #3, secured with adaptic and steristrips to Left foot ulceration and dry sterile dressing  - Advised patient to go to the ER or return to office if the wound worsens or if constitutional symptoms are present. -Advised patient to make sure to keep dressings clean, dry, and intact allowing nursing to change using adaptic, steristrips, and offloading padding; Resume on Friday like before.  -Advised patient to continue with forefoot offloading post-op shoe on left foot with use of cane -Advised patient to limit activity  to necessity  -Advised patient to rest and elevate as necessary -Patient to return for wound check and possible reapplication of EPIFIX in 2-3 weeks. In the meantime, patient to call office if any issues or problems arise.   Landis Martins, DPM

## 2017-07-21 ENCOUNTER — Telehealth: Payer: Self-pay | Admitting: *Deleted

## 2017-07-21 NOTE — Telephone Encounter (Addendum)
Emailed to T. Muncey - MiMedxorder for 81PT Epifix for application 47/01/6150 8:34PB in [place] office.

## 2017-07-21 NOTE — Telephone Encounter (Signed)
-----   Message from Landis Martins, [place] sent at 07/20/2017 12:51 PM EST ----- Regarding: Reorder Reorder 24 mm epifix for next visit

## 2017-07-22 ENCOUNTER — Encounter: Payer: Self-pay | Admitting: Dietician

## 2017-07-22 ENCOUNTER — Encounter: Payer: Medicare Other | Attending: Endocrinology | Admitting: Dietician

## 2017-07-22 ENCOUNTER — Other Ambulatory Visit (INDEPENDENT_AMBULATORY_CARE_PROVIDER_SITE_OTHER): Payer: Medicare Other

## 2017-07-22 DIAGNOSIS — E11649 Type 2 diabetes mellitus with hypoglycemia without coma: Secondary | ICD-10-CM

## 2017-07-22 DIAGNOSIS — E119 Type 2 diabetes mellitus without complications: Secondary | ICD-10-CM | POA: Diagnosis not present

## 2017-07-22 DIAGNOSIS — Z794 Long term (current) use of insulin: Secondary | ICD-10-CM

## 2017-07-22 DIAGNOSIS — E1165 Type 2 diabetes mellitus with hyperglycemia: Secondary | ICD-10-CM | POA: Diagnosis not present

## 2017-07-22 DIAGNOSIS — Z713 Dietary counseling and surveillance: Secondary | ICD-10-CM | POA: Diagnosis not present

## 2017-07-22 LAB — BASIC METABOLIC PANEL
BUN: 24 mg/dL — AB (ref 6–23)
CALCIUM: 8.6 mg/dL (ref 8.4–10.5)
CHLORIDE: 106 meq/L (ref 96–112)
CO2: 25 mEq/L (ref 19–32)
CREATININE: 1.66 mg/dL — AB (ref 0.40–1.50)
GFR: 53.35 mL/min — ABNORMAL LOW (ref >60.00)
GLUCOSE: 136 mg/dL — AB (ref 70–99)
Potassium: 4 mEq/L (ref 3.5–5.1)
Sodium: 139 mEq/L (ref 135–145)

## 2017-07-22 LAB — HEMOGLOBIN A1C: Hgb A1c MFr Bld: 8.3 % — ABNORMAL HIGH (ref 4.6–6.5)

## 2017-07-22 LAB — MICROALBUMIN / CREATININE URINE RATIO
Creatinine,U: 84.3 mg/dL
Microalb Creat Ratio: 35.3 mg/g — ABNORMAL HIGH (ref 0.0–30.0)
Microalb, Ur: 29.8 mg/dL — ABNORMAL HIGH (ref 0.0–1.9)

## 2017-07-22 NOTE — Progress Notes (Signed)
Diabetes Self-Management Education  Visit Type:  Follow-up  Appt. Start Time: 1000 Appt. End Time: 8182  07/22/2017  [name], identified by name and date of birth, is a 67 y.o. male with a diagnosis of Diabetes:  Type 2 a patient of Dr. Dwyane Dee.  He is also seen at the [facility].  He has had nutrition and CDE appointments over the past 2 years with myself, Vaughan Basta, CDE; and Butch Penny, CDE. History includes CKD, HTN, OSA on C-pap, hyperlipidemia, and GERD.  He has had a couple of toes amputated and is followed at the wound clinic for a wound on foot for the past year. Medications include Victoza and Humalin R-500 in a V-go.  He uses 2 clicks for breakfast 4-5 clicks for lunch, and 5-6 clicks for dinner. Because of a change in his insurance and a couple of problems with insertion, he is short 3 of the V-go this month.   Provided patient a starter package of V-Go 20 Lot [id number], expiration 2019-05-07. He continues to use the [employer] and is transitioning to the 14 day sensor soon.  Reviewed scanner which indicated a reduced blood sugar over the past 3 weeks. Labs today run after today's visit include:  BUN24, Creatinine 1.66 (decreased), Potassium 4, GFR 53 (increased), A1C 8.3% increased from 8% 06/19/17, urine Creatinine 84.3.  Weight hx: 402 lbs today decreased from 406 lbs 05/21/17.   393 lbs 07/01/15 He does retain fluid.   Patient lives alone.  His wife is in and out of the home.  She came over Christmas and threw out food that she didn't think that he should eat.  She assists him at times with his wound.  He does the shopping and cooking.  He relies on [employer] for transportation.  Walking is limited due to foot wound.  Since last admit, he has decreased his chip intake, does not drink dark soda or bottled tea.  He also appears to have been reading labels a little related to sodium.  Question if there is some barrier with reading.   ASSESSMENT  Weight (!) 402  lb (182.3 kg). Body mass index is 47.18 kg/m.   Diabetes Self-Management Education - 07/22/17 1100      Psychosocial Assessment   Patient Belief/Attitude about Diabetes  Motivated to manage diabetes    Self-care barriers  None    Self-management support  Doctor's office    Patient Concerns  Nutrition/Meal planning    Special Needs  None    Preferred Learning Style  No preference indicated    Learning Readiness  Ready      Pre-Education Assessment   Patient understands the diabetes disease and treatment process.  Demonstrates understanding / competency    Patient understands incorporating nutritional management into lifestyle.  Needs Review    Patient undertands incorporating physical activity into lifestyle.  Needs Review    Patient understands using medications safely.  Needs Review    Patient understands monitoring blood glucose, interpreting and using results  Demonstrates understanding / competency    Patient understands prevention, detection, and treatment of acute complications.  Demonstrates understanding / competency    Patient understands prevention, detection, and treatment of chronic complications.  Demonstrates understanding / competency    Patient understands how to develop strategies to address psychosocial issues.  Needs Review    Patient understands how to develop strategies to promote health/change behavior.  Needs Review      Complications   Last HgB A1C per  patient/outside source  8 % 05/20/17    How often do you check your blood sugar?  > 4 times/day    Fasting Blood glucose range (mg/dL)  70-129    Postprandial Blood glucose range (mg/dL)  130-179      Dietary Intake   Breakfast  boiled egg, neeses sausage, 1 slice toast, occasional bananana    Snack (morning)  none    Lunch  steak, instant potatoes, green beans    Snack (afternoon)  fruit    Dinner  steak, instant potatoes, fruit    Snack (evening)  apple turnover last night or wrap or fruit    Beverage(s)   water, zero gatorade, occasional diet Mt. Dew, LS V-8, Occasional coffee with creamer and splenda      Exercise   Exercise Type  ADL's foot wound    How many days per week to you exercise?  0    How many minutes per day do you exercise?  0    Total minutes per week of exercise  0      Patient Education   Previous Diabetes Education  Yes (please comment)    Nutrition management   Role of diet in the treatment of diabetes and the relationship between the three main macronutrients and blood glucose level;Food label reading, portion sizes and measuring food.;Carbohydrate counting;Meal options for control of blood glucose level and chronic complications.    Medications  Reviewed patients medication for diabetes, action, purpose, timing of dose and side effects.    Monitoring  Identified appropriate SMBG and/or A1C goals.;Other (comment) reviewed current Libre readings    Acute complications  Other (comment) reviewed treatment and importance of carrying glucose tabs o    Psychosocial adjustment  Worked with patient to identify barriers to care and solutions    Personal strategies to promote health  Lifestyle issues that need to be addressed for better diabetes care      Individualized Goals (developed by patient)   Nutrition  General guidelines for healthy choices and portions discussed    Physical Activity  Exercise 3-5 times per week;15 minutes per day    Medications  take my medication as prescribed    Monitoring   test my blood glucose as discussed    Reducing Risk  examine blood glucose patterns    Health Coping  discuss diabetes with (comment) MD, RD, CDE      Post-Education Assessment   Patient understands the diabetes disease and treatment process.  Demonstrates understanding / competency    Patient understands incorporating nutritional management into lifestyle.  Needs Review    Patient undertands incorporating physical activity into lifestyle.  Demonstrates understanding / competency     Patient understands using medications safely.  Demonstrates understanding / competency    Patient understands monitoring blood glucose, interpreting and using results  Demonstrates understanding / competency    Patient understands prevention, detection, and treatment of acute complications.  Demonstrates understanding / competency    Patient understands prevention, detection, and treatment of chronic complications.  Demonstrates understanding / competency    Patient understands how to develop strategies to address psychosocial issues.  Demonstrates understanding / competency    Patient understands how to develop strategies to promote health/change behavior.  Needs Review      Outcomes   Program Status  Completed      Subsequent Visit   Since your last visit have you continued or begun to take your medications as prescribed?  Yes    Since  your last visit have you experienced any weight changes?  Gain    Weight Gain (lbs)  2    Since your last visit, are you checking your blood glucose at least once a day?  Yes       Learning Objective:  Patient will have a greater understanding of diabetes self-management. Patient education plan is to attend individual and/or group sessions per assessed needs and concerns.   Plan:   Patient Instructions  Be sure to carry glucose tabs and your Free Style [place] reader with you. Continue to follow Dr. Ronnie Derby advice related to how many clicks on the V-go for your meals. Good job on the changes that you have made (decreased chips). If you are bored find something to do rather than eat. Buy fresh or frozen without salt rather than processed or canned. Continue to reduce your lite salt use.  Discuss the use of Lite Salt with your doctor. Have a small portion of protein with each snack (handful of unsalted nuts and fruit). Consider 1/2 sandwich prior to bed if you are concerned about your blood sugar being low rather than a sweet.  Make a follow up  appointment with me when you see Dr. Dwyane Dee next.      Expected Outcomes:  Demonstrated interest in learning. Expect positive outcomes  Education material provided: Food label handouts and Meal plan card  If problems or questions, patient to contact team via:  Phone  Future DSME appointment: - 3-4 months

## 2017-07-22 NOTE — Patient Instructions (Signed)
Be sure to carry glucose tabs and your [facility] reader with you. Continue to follow Dr. Ronnie Derby advice related to how many clicks on the V-go for your meals. Good job on the changes that you have made (decreased chips). If you are bored find something to do rather than eat. Buy fresh or frozen without salt rather than processed or canned. Continue to reduce your lite salt use.  Discuss the use of Lite Salt with your doctor. Have a small portion of protein with each snack (handful of unsalted nuts and fruit). Consider 1/2 sandwich prior to bed if you are concerned about your blood sugar being low rather than a sweet.  Make a follow up appointment with me when you see Dr. Dwyane Dee next.

## 2017-07-23 ENCOUNTER — Other Ambulatory Visit: Payer: Self-pay | Admitting: Internal Medicine

## 2017-07-23 ENCOUNTER — Other Ambulatory Visit: Payer: Self-pay | Admitting: Endocrinology

## 2017-07-23 DIAGNOSIS — E11621 Type 2 diabetes mellitus with foot ulcer: Secondary | ICD-10-CM | POA: Diagnosis not present

## 2017-07-23 DIAGNOSIS — Z794 Long term (current) use of insulin: Secondary | ICD-10-CM | POA: Diagnosis not present

## 2017-07-23 DIAGNOSIS — E11311 Type 2 diabetes mellitus with unspecified diabetic retinopathy with macular edema: Secondary | ICD-10-CM | POA: Diagnosis not present

## 2017-07-23 DIAGNOSIS — L97522 Non-pressure chronic ulcer of other part of left foot with fat layer exposed: Secondary | ICD-10-CM | POA: Diagnosis not present

## 2017-07-23 DIAGNOSIS — I129 Hypertensive chronic kidney disease with stage 1 through stage 4 chronic kidney disease, or unspecified chronic kidney disease: Secondary | ICD-10-CM | POA: Diagnosis not present

## 2017-07-23 DIAGNOSIS — Z7901 Long term (current) use of anticoagulants: Secondary | ICD-10-CM | POA: Diagnosis not present

## 2017-07-23 DIAGNOSIS — E1142 Type 2 diabetes mellitus with diabetic polyneuropathy: Secondary | ICD-10-CM | POA: Diagnosis not present

## 2017-07-23 DIAGNOSIS — E1122 Type 2 diabetes mellitus with diabetic chronic kidney disease: Secondary | ICD-10-CM | POA: Diagnosis not present

## 2017-07-23 DIAGNOSIS — Z4800 Encounter for change or removal of nonsurgical wound dressing: Secondary | ICD-10-CM | POA: Diagnosis not present

## 2017-07-23 DIAGNOSIS — N183 Chronic kidney disease, stage 3 (moderate): Secondary | ICD-10-CM | POA: Diagnosis not present

## 2017-07-23 DIAGNOSIS — Z89422 Acquired absence of other left toe(s): Secondary | ICD-10-CM | POA: Diagnosis not present

## 2017-07-23 NOTE — Telephone Encounter (Signed)
Next appt scheduled  08/31/17 with PCP.

## 2017-07-26 ENCOUNTER — Ambulatory Visit: Payer: Medicare Other | Admitting: Endocrinology

## 2017-07-26 DIAGNOSIS — Z7901 Long term (current) use of anticoagulants: Secondary | ICD-10-CM | POA: Diagnosis not present

## 2017-07-26 DIAGNOSIS — I129 Hypertensive chronic kidney disease with stage 1 through stage 4 chronic kidney disease, or unspecified chronic kidney disease: Secondary | ICD-10-CM | POA: Diagnosis not present

## 2017-07-26 DIAGNOSIS — Z794 Long term (current) use of insulin: Secondary | ICD-10-CM | POA: Diagnosis not present

## 2017-07-26 DIAGNOSIS — Z4800 Encounter for change or removal of nonsurgical wound dressing: Secondary | ICD-10-CM | POA: Diagnosis not present

## 2017-07-26 DIAGNOSIS — E11621 Type 2 diabetes mellitus with foot ulcer: Secondary | ICD-10-CM | POA: Diagnosis not present

## 2017-07-26 DIAGNOSIS — E1122 Type 2 diabetes mellitus with diabetic chronic kidney disease: Secondary | ICD-10-CM | POA: Diagnosis not present

## 2017-07-26 DIAGNOSIS — N183 Chronic kidney disease, stage 3 (moderate): Secondary | ICD-10-CM | POA: Diagnosis not present

## 2017-07-26 DIAGNOSIS — L97522 Non-pressure chronic ulcer of other part of left foot with fat layer exposed: Secondary | ICD-10-CM | POA: Diagnosis not present

## 2017-07-26 DIAGNOSIS — Z89422 Acquired absence of other left toe(s): Secondary | ICD-10-CM | POA: Diagnosis not present

## 2017-07-26 DIAGNOSIS — E11311 Type 2 diabetes mellitus with unspecified diabetic retinopathy with macular edema: Secondary | ICD-10-CM | POA: Diagnosis not present

## 2017-07-26 DIAGNOSIS — E1142 Type 2 diabetes mellitus with diabetic polyneuropathy: Secondary | ICD-10-CM | POA: Diagnosis not present

## 2017-07-26 NOTE — Progress Notes (Deleted)
Patient ID: [name], male   DOB: 06-14-1951, 67 y.o.   MRN: [id number]           Reason for Appointment: Follow-up for Type 2 Diabetes  Referring physician: Dareen Piano  History of Present Illness:          Date of diagnosis of type 2 diabetes mellitus: 2007       Background history:   He was diagnosed to have diabetes when he had an ulcer on his left third toe which led to amputation.  His glucose was apparently about 400 and he was started on insulin at that time He does not know if he has taken any oral hypoglycemic drugs in the past His A1c in 2016 has been consistently over 8% On his initial consultation was started on Victoza and subsequently started on U-500 insulin in 2/17 along with Toujeo INSULIN regimen previously was:  TOUJEO  74 in the morning and 74 in the evening HUMULIN U-500 insulin: 20/40 units in the morning, 60/70 at lunch and 70 at supper  Recent history:   Since 8/18 has been using V-go PUMP 20 unit basal, Humulin R U-500 insulin with boluses 4 units breakfast, 8 units 8-10 unis Changes his pump at 8 am  Non-insulin hypoglycemic drugs the patient is taking are: Victoza 1.8 mg daily        His last A1c was 9.3, now 8.0 Most recent fructosamine is 210  Current blood sugar patterns and problems identified:  His blood sugars were initially tending to get significantly low overnight with starting the V-go pump and the U-500 insulin  However subsequently he has not needed to take off his insulin pump during the night to prevent low blood sugars, he has done this only once with mild hypoglycemia occurring  RECENT average blood sugar is only 155 without any hypoglycemia documented  He is starting to use the freestyle LIBRE system now without any difficulty or difficulty with insurance coverage  With this he is checking blood sugars several times a day and 5 discontinued  However has not monitored his mistake and usually concomitantly, he thinks maybe  his sensor is reading about 10 mg lower than the fingerstick overall  LOWEST blood sugars are again still early morning between 6- 8 AM  HIGHEST blood sugars are between 10 PM-midnight averaging 201  Because of fear of hypoglycemia he is not increasing his boluses at suppertime and usually clicking 4-5 times  He is trying to bolus 30 minutes before eating but does not always of November to do this  His recent weight appears to have leveled off or slightly better  Blood sugars after breakfast and lunch are not high today blood sugar before lunch was 116  He has seen the dietitian in the past  Hypoglycemia: Minimal  Glucose monitoring:  done 3-4  times a day         Glucometer:  Accu-Chek     Blood Glucose readings by download:   2 week download indicates 28% the readings above 180, 70% within 70-180 and 2% below  PRE-MEAL Fasting Lunch Dinner Bedtime Overall  Glucose range: 60-1 54       Mean/median: 110  144  166  201   158+/-50    POST-MEAL PC Breakfast PC Lunch PC Dinner  Glucose range:  86-279   1 58-282   Mean/median:        Self-care:  Typical meal intake: Breakfast is sometimes fruit or cereal, Sometimes eating sausage  and grits Has been trying to avoid high-fat foods or fried food  Breakfast 8 AM Lunch 12 noon Dinner at 5-6 pm He drinks cranberry or V-8 juice and diet drinks                Dietician consultation: 12/16               Exercise: not walking,  has chronic foot ulcer on the left  Weight history: Previous range 260-410  Wt Readings from Last 3 Encounters:  07/22/17 (!) 402 lb (182.3 kg)  05/26/17 (!) 400 lb 6.4 oz (181.6 kg)  05/20/17 (!) 406 lb (184.2 kg)    Glycemic control:   Lab Results  Component Value Date   HGBA1C 8.3 (H) 07/22/2017   HGBA1C 8.0 (H) 05/20/2017   HGBA1C 9.3 (H) 03/01/2017   Lab Results  Component Value Date   MICROALBUR 29.8 (H) 07/22/2017   LDLCALC 55 05/20/2017   CREATININE 1.66 (H) 07/22/2017    OTHER  problems review today: See review of systems     Lab on 07/22/2017  Component Date Value Ref Range Status  . Microalb, Ur 07/22/2017 29.8* 0.0 - 1.9 mg/dL Final  . Creatinine,U 07/22/2017 84.3  mg/dL Final  . Microalb Creat Ratio 07/22/2017 35.3* 0.0 - 30.0 mg/g Final  . Sodium 07/22/2017 139  135 - 145 mEq/L Final  . Potassium 07/22/2017 4.0  3.5 - 5.1 mEq/L Final  . Chloride 07/22/2017 106  96 - 112 mEq/L Final  . CO2 07/22/2017 25  19 - 32 mEq/L Final  . Glucose, Bld 07/22/2017 136* 70 - 99 mg/dL Final  . BUN 07/22/2017 24* 6 - 23 mg/dL Final  . Creatinine, Ser 07/22/2017 1.66* 0.40 - 1.50 mg/dL Final  . Calcium 07/22/2017 8.6  8.4 - 10.5 mg/dL Final  . GFR 07/22/2017 53.35* >60.00 mL/min Final  . Hgb A1c MFr Bld 07/22/2017 8.3* 4.6 - 6.5 % Final   Glycemic Control Guidelines for People with Diabetes:Non Diabetic:  <6%Goal of Therapy: <7%Additional Action Suggested:  >8%        Allergies as of 07/26/2017      Reactions   Vancomycin    REACTION: ARF      Medication List        Accurate as of 07/26/17  8:19 AM. Always use your most recent med list.          ACCU-CHEK AVIVA PLUS w/Device Kit CHECK BLOOD SUGAR 5 TIMES A DAY   ACCU-CHEK FASTCLIX LANCETS Misc Check blood sugar 5 times a day   albuterol 108 (90 Base) MCG/ACT inhaler Commonly known as:  PROAIR HFA Inhale 2 puffs into the lungs every 6 (six) hours as needed for wheezing or shortness of breath.   atorvastatin 20 MG tablet Commonly known as:  LIPITOR TAKE 1 TABLET(20 MG) BY MOUTH DAILY   becaplermin 0.01 % gel Commonly known as:  REGRANEX Apply 1 application topically daily.   diclofenac sodium 1 % Gel Commonly known as:  VOLTAREN Apply 4 g topically 4 (four) times daily.   docusate sodium 100 MG capsule Commonly known as:  COLACE Take 1 capsule (100 mg total) by mouth daily as needed for mild constipation.   ELIQUIS 5 MG Tabs tablet Generic drug:  apixaban TAKE 1 TABLET(5 MG) BY MOUTH TWICE  DAILY   enalapril 20 MG tablet Commonly known as:  VASOTEC TAKE 2 TABLETS(40 MG) BY MOUTH DAILY   FREESTYLE LIBRE SENSOR SYSTEM Misc 1 each by Does not  apply route 6 (six) times daily.   furosemide 40 MG tablet Commonly known as:  LASIX TAKE 1 TABLET(40 MG) BY MOUTH TWICE DAILY   gabapentin 300 MG capsule Commonly known as:  NEURONTIN TAKE 1 CAPSULE(300 MG) BY MOUTH AT BEDTIME   glucose blood test strip Commonly known as:  ACCU-CHEK AVIVA PLUS USE TO CHECK BLOOD Five TIMES DAILY   HYDROcodone-acetaminophen 7.5-325 MG tablet Commonly known as:  NORCO Take 1 tablet by mouth every 6 (six) hours as needed for moderate pain.   Insulin Pen Needle 31G X 5 MM Misc Use for insulin injection 5 times a day.   insulin regular human CONCENTRATED 500 UNIT/ML kwikpen Commonly known as:  HUMULIN R U-500 KWIKPEN INJECT 35 UNITS AT BREAKFAST, 60 UNITS AT LUNCH, AND 70 UNITS AT DINNER   HUMULIN R 500 UNIT/ML injection Generic drug:  insulin regular human CONCENTRATED DRAW INSULIN TO THE 68 UNIT LINE ON THE U-100 SYRINGE(TO USE 340 UNITS) PER DAY AS DIRECTED   Insulin Syringe-Needle U-100 25G X 1" 1 ML Misc Use 1 syringe daily to fill V-Go pump   INSULIN SYRINGE 1CC/29G 29G X 1/2" 1 ML Misc USE DAILY AS DIRECTED   metoprolol succinate 25 MG 24 hr tablet Commonly known as:  TOPROL-XL TAKE 1 TABLET(25 MG) BY MOUTH DAILY   omeprazole 20 MG capsule Commonly known as:  PRILOSEC TAKE 1 CAPSULE(20 MG) BY MOUTH DAILY   promethazine 25 MG tablet Commonly known as:  PHENERGAN TAKE 1 TABLET(25 MG) BY MOUTH EVERY 8 HOURS AS NEEDED FOR NAUSEA OR VOMITING   silver sulfADIAZINE 1 % cream Commonly known as:  SILVADENE Apply 1 application topically daily.   SIMBRINZA 1-0.2 % Susp Generic drug:  Brinzolamide-Brimonidine Place 1 drop into both eyes 2 (two) times daily. Take as instructed by Dr. Katy Fitch.   TOUJEO SOLOSTAR 300 UNIT/ML Sopn Generic drug:  Insulin Glargine INJECT 60 UNITS INTO THE  SKIN EVERY MORNING AND 90 UNITS INTO THE SKIN AT NIGHT   V-GO 20 Kit USE 1 PUMP DAILY WITH INSULIN   VICTOZA 18 MG/3ML Sopn Generic drug:  liraglutide ADMINISTER 1.8 MG UNDER THE SKIN DAILY   Vitamin D3 2000 units capsule Take 1 capsule (2,000 Units total) by mouth daily.       Allergies:  Allergies  Allergen Reactions  . Vancomycin     REACTION: ARF    Past Medical History:  Diagnosis Date  . Arthritis    "elbows & knees" (12/13/2014)  . Asthma   . CKD (chronic kidney disease), stage III ([place])   . DIABETIC FOOT ULCER 06/20/2009  . Edema, macular, due to secondary diabetes ([place])   . Erectile dysfunction   . GERD (gastroesophageal reflux disease)   . HEARING LOSS, SENSORINEURAL, BILATERAL 01/03/2007   Seen by ENT Dr. Orpah Greek D. Redmond Baseman 01/03/07  . Hemorrhoids   . History of echocardiogram    a. 04/2008 Echo: EF 50-55%, abnl LV relaxation, mildly dil LA.  Marland Kitchen Hyperlipidemia   . Hypertension   . Morbid obesity ([place])   . Neuropathy, lower extremity   . OSA (obstructive sleep apnea)    uses CPAP nightly  . OSA on CPAP    Nocturnal polysomnogram on 01/21/2010 showed severe obstructive sleep apnea/hypopnea syndrome, AHI 74.1 per hour with non positional events, moderately loud snoring, and oxygen desaturation to a nadir of 78% on room air.  CPAP was successfully titrated to 17 CWP, AHI 1.1 per hour using a large ResMed Mirage Quattro full-face mask with  heated humidifier. Bruxism was noted.   . Osteomyelitis of ankle and foot ([place])   . Retinopathy   . Type II diabetes mellitus (HCC)    w/complication NOS, type II    Past Surgical History:  Procedure Laterality Date  . AMPUTATION Left 12/15/2014   Procedure: LEFT SECOND TOE AMPUTATION ;  Surgeon: Dorna Leitz, MD;  Location: [place];  Service: Orthopedics;  Laterality: Left;  . 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 Kathalene Frames. Mayer Camel.  . WOUND  DEBRIDEMENT Left 06/15/2016   Procedure: DEBRIDEMENT WOUND LEFT FOOT WITH GRAFT APPLICATION;  Surgeon: Landis Martins, DPM;  Location: [place];  Service: Podiatry;  Laterality: Left;    Family History  Problem Relation Age of Onset  . Diabetes Mother        also 2 siblings  . Heart attack Father 36  . Throat cancer Brother     Social History:  reports that he quit smoking about 7 years ago. His smoking use included cigarettes. He has a 15.00 pack-year smoking history. he has never used smokeless tobacco. He reports that he drinks about 6.6 oz of alcohol per week. He reports that he does not use drugs.    Review of Systems     Lipid management: taking Lipitor 20 mg, followed by PCP Also has low HDL    Lab Results  Component Value Date   CHOL 116 05/20/2017   HDL 30.80 (L) 05/20/2017   LDLCALC 55 05/20/2017   TRIG 155.0 (H) 05/20/2017   CHOLHDL 4 05/20/2017            Hypertension: Has had hypertension for a few years treated with enalapril,  taking 40 mg  Also on Lasix and 25 mg metoprolol This is treated by PCP  He also has chronic renal dysfunction, stable and mild  Lab Results  Component Value Date   CREATININE 1.66 (H) 07/22/2017   CREATININE 1.74 (H) 05/20/2017   CREATININE 1.80 (H) 04/20/2017     Last foot exam done by podiatrist Has been seen regularly by podiatrist for chronic ulcer   Physical Examination:  There were no vitals taken for this visit.   no significant edema present    ASSESSMENT:  Diabetes type 2, uncontrolled with morbid obesity  See history of present illness for detailed discussion of current diabetes management, blood sugar patterns and problems identified  With V-go pump and using U-500 insulin his blood sugars are overall better controlled although his A1c is still relatively high at 8%  Although he may be getting excessive basal insulin overnight occasionally he is not getting significant hypoglycemia low  sugars have been documented only one night recently in the last 2 weeks Currently not clear if his fingerstick and sensor blood sugars are correlating, he has not done somebody needs readings Lowest blood sugars are early morning and highest blood sugars late at night indicating inadequate coverage of evening meal usually He can still do a little better at times with using low fat meals especially in the evening as discussed with patient today With improvement in his blood sugar is weight did increase but is leveling off  PLAN:     Will need to have him take extra 2 units bolus for his evening meal consistently and continue to take the same dose at breakfast and lunch  He will adjust his boluses based on his blood sugar reading and also  his meal size  He'll continue to try and bolus 30 minutes before eating  Again reminded him to change his pump at the same time daily  Continue Victoza  Consider consultation with dietitian if continuing to gain weight  May have a bedtime snack if blood sugars are low normal at bedtime  Check blood sugar with Accu-Chek and the freestyle Libre simultaneously to see if they correlate   There are no Patient Instructions on file for this visit.    Elayne Snare 07/26/2017, 8:19 AM   Note: This office note was prepared with Dragon voice recognition system technology. Any transcriptional errors that result from this process are unintentional.  Counseling time on subjects discussed in assessment and plan sections is over 50% of today's 25 minute visit

## 2017-07-28 DIAGNOSIS — Z4800 Encounter for change or removal of nonsurgical wound dressing: Secondary | ICD-10-CM | POA: Diagnosis not present

## 2017-07-28 DIAGNOSIS — I129 Hypertensive chronic kidney disease with stage 1 through stage 4 chronic kidney disease, or unspecified chronic kidney disease: Secondary | ICD-10-CM | POA: Diagnosis not present

## 2017-07-28 DIAGNOSIS — L97522 Non-pressure chronic ulcer of other part of left foot with fat layer exposed: Secondary | ICD-10-CM | POA: Diagnosis not present

## 2017-07-28 DIAGNOSIS — E1122 Type 2 diabetes mellitus with diabetic chronic kidney disease: Secondary | ICD-10-CM | POA: Diagnosis not present

## 2017-07-28 DIAGNOSIS — Z794 Long term (current) use of insulin: Secondary | ICD-10-CM | POA: Diagnosis not present

## 2017-07-28 DIAGNOSIS — E11311 Type 2 diabetes mellitus with unspecified diabetic retinopathy with macular edema: Secondary | ICD-10-CM | POA: Diagnosis not present

## 2017-07-28 DIAGNOSIS — E1142 Type 2 diabetes mellitus with diabetic polyneuropathy: Secondary | ICD-10-CM | POA: Diagnosis not present

## 2017-07-28 DIAGNOSIS — E11621 Type 2 diabetes mellitus with foot ulcer: Secondary | ICD-10-CM | POA: Diagnosis not present

## 2017-07-28 DIAGNOSIS — N183 Chronic kidney disease, stage 3 (moderate): Secondary | ICD-10-CM | POA: Diagnosis not present

## 2017-07-28 DIAGNOSIS — Z89422 Acquired absence of other left toe(s): Secondary | ICD-10-CM | POA: Diagnosis not present

## 2017-07-28 DIAGNOSIS — Z7901 Long term (current) use of anticoagulants: Secondary | ICD-10-CM | POA: Diagnosis not present

## 2017-07-29 ENCOUNTER — Telehealth: Payer: Self-pay | Admitting: Dietician

## 2017-07-29 ENCOUNTER — Other Ambulatory Visit: Payer: Self-pay | Admitting: Endocrinology

## 2017-07-29 DIAGNOSIS — E11649 Type 2 diabetes mellitus with hypoglycemia without coma: Secondary | ICD-10-CM

## 2017-07-29 NOTE — Telephone Encounter (Signed)
[name] asks for assistance with getting the freestyle libre CGM. He is having a tough time getting supplies for [facility].

## 2017-07-30 DIAGNOSIS — Z4800 Encounter for change or removal of nonsurgical wound dressing: Secondary | ICD-10-CM | POA: Diagnosis not present

## 2017-07-30 DIAGNOSIS — E1122 Type 2 diabetes mellitus with diabetic chronic kidney disease: Secondary | ICD-10-CM | POA: Diagnosis not present

## 2017-07-30 DIAGNOSIS — L97522 Non-pressure chronic ulcer of other part of left foot with fat layer exposed: Secondary | ICD-10-CM | POA: Diagnosis not present

## 2017-07-30 DIAGNOSIS — Z89422 Acquired absence of other left toe(s): Secondary | ICD-10-CM | POA: Diagnosis not present

## 2017-07-30 DIAGNOSIS — I129 Hypertensive chronic kidney disease with stage 1 through stage 4 chronic kidney disease, or unspecified chronic kidney disease: Secondary | ICD-10-CM | POA: Diagnosis not present

## 2017-07-30 DIAGNOSIS — Z794 Long term (current) use of insulin: Secondary | ICD-10-CM | POA: Diagnosis not present

## 2017-07-30 DIAGNOSIS — N183 Chronic kidney disease, stage 3 (moderate): Secondary | ICD-10-CM | POA: Diagnosis not present

## 2017-07-30 DIAGNOSIS — E11621 Type 2 diabetes mellitus with foot ulcer: Secondary | ICD-10-CM | POA: Diagnosis not present

## 2017-07-30 DIAGNOSIS — E11311 Type 2 diabetes mellitus with unspecified diabetic retinopathy with macular edema: Secondary | ICD-10-CM | POA: Diagnosis not present

## 2017-07-30 DIAGNOSIS — Z7901 Long term (current) use of anticoagulants: Secondary | ICD-10-CM | POA: Diagnosis not present

## 2017-07-30 DIAGNOSIS — E1142 Type 2 diabetes mellitus with diabetic polyneuropathy: Secondary | ICD-10-CM | POA: Diagnosis not present

## 2017-08-02 DIAGNOSIS — Z4800 Encounter for change or removal of nonsurgical wound dressing: Secondary | ICD-10-CM | POA: Diagnosis not present

## 2017-08-02 DIAGNOSIS — E11311 Type 2 diabetes mellitus with unspecified diabetic retinopathy with macular edema: Secondary | ICD-10-CM | POA: Diagnosis not present

## 2017-08-02 DIAGNOSIS — E11621 Type 2 diabetes mellitus with foot ulcer: Secondary | ICD-10-CM | POA: Diagnosis not present

## 2017-08-02 DIAGNOSIS — L97522 Non-pressure chronic ulcer of other part of left foot with fat layer exposed: Secondary | ICD-10-CM | POA: Diagnosis not present

## 2017-08-02 DIAGNOSIS — E1142 Type 2 diabetes mellitus with diabetic polyneuropathy: Secondary | ICD-10-CM | POA: Diagnosis not present

## 2017-08-02 DIAGNOSIS — N183 Chronic kidney disease, stage 3 (moderate): Secondary | ICD-10-CM | POA: Diagnosis not present

## 2017-08-02 DIAGNOSIS — Z794 Long term (current) use of insulin: Secondary | ICD-10-CM | POA: Diagnosis not present

## 2017-08-02 DIAGNOSIS — I129 Hypertensive chronic kidney disease with stage 1 through stage 4 chronic kidney disease, or unspecified chronic kidney disease: Secondary | ICD-10-CM | POA: Diagnosis not present

## 2017-08-02 DIAGNOSIS — Z7901 Long term (current) use of anticoagulants: Secondary | ICD-10-CM | POA: Diagnosis not present

## 2017-08-02 DIAGNOSIS — E1122 Type 2 diabetes mellitus with diabetic chronic kidney disease: Secondary | ICD-10-CM | POA: Diagnosis not present

## 2017-08-02 DIAGNOSIS — Z89422 Acquired absence of other left toe(s): Secondary | ICD-10-CM | POA: Diagnosis not present

## 2017-08-03 ENCOUNTER — Ambulatory Visit (INDEPENDENT_AMBULATORY_CARE_PROVIDER_SITE_OTHER): Payer: Medicare Other | Admitting: Sports Medicine

## 2017-08-03 ENCOUNTER — Encounter: Payer: Self-pay | Admitting: Sports Medicine

## 2017-08-03 VITALS — BP 152/90 | HR 67 | Resp 16

## 2017-08-03 DIAGNOSIS — Z89432 Acquired absence of left foot: Secondary | ICD-10-CM

## 2017-08-03 DIAGNOSIS — E0842 Diabetes mellitus due to underlying condition with diabetic polyneuropathy: Secondary | ICD-10-CM

## 2017-08-03 DIAGNOSIS — IMO0002 Reserved for concepts with insufficient information to code with codable children: Secondary | ICD-10-CM

## 2017-08-03 DIAGNOSIS — I739 Peripheral vascular disease, unspecified: Secondary | ICD-10-CM

## 2017-08-03 DIAGNOSIS — L97522 Non-pressure chronic ulcer of other part of left foot with fat layer exposed: Secondary | ICD-10-CM

## 2017-08-03 MED ORDER — FREESTYLE LIBRE 14 DAY SENSOR MISC: 1.0000 | Freq: Every day | 12 refills | Status: DC

## 2017-08-03 MED ORDER — GLUCOSE BLOOD VI STRP: ORAL_STRIP | 12 refills | Status: DC

## 2017-08-03 MED ORDER — FREESTYLE LIBRE 14 DAY READER DEVI: 1.0000 | Freq: Every day | 1 refills | Status: AC

## 2017-08-03 NOTE — Telephone Encounter (Signed)
Will sign it tomorrow. Thank you Butch Penny

## 2017-08-03 NOTE — Telephone Encounter (Signed)
[name] was able to get through to [facility] and they said they need a prescription for the 14 day Freestyle libre sensors and device and strips.

## 2017-08-03 NOTE — Progress Notes (Signed)
Subjective: [name] is a 67 y.o. male patient seen today in office for POV #35 (DOS 06-15-16), S/P Left wound debridement and placement of stravix allograft. Patient is HERE TODAY FOR ANOTHER EPIFIX GRAFT APPLICATON, denies pain at surgical site, denies calf pain, denies headache, chest pain, shortness of breath, nausea, vomiting, fever, or chills. No other issues noted.   FBS on insulin pump   On Eliquis   Patient Active Problem List   Diagnosis Date Noted  . Chronic pain syndrome 09/18/2015  . Preventative health care 03/12/2015  . Foot ulcer, left ([place]) 12/13/2014  . Paroxysmal atrial fibrillation ([place]) 12/13/2014  . Elevated alkaline phosphatase level 06/11/2014  . Constipation 05/31/2014  . Vitamin D deficiency 11/17/2013  . Anemia 02/10/2012  . Hypertension 11/18/2011  . Obstructive sleep apnea 12/12/2009  . GERD 06/27/2008  . Diabetes type 2, uncontrolled ([place]) 12/28/2007  . DIABETIC MACULAR EDEMA 10/07/2007  . Dyslipidemia 08/15/2007  . BACKGROUND DIABETIC RETINOPATHY 08/15/2007  . HEARING LOSS, SENSORINEURAL, BILATERAL 01/03/2007  . Diabetic peripheral neuropathy associated with type 2 diabetes mellitus ([place]) 12/16/2006  . Chronic kidney disease, stage III (moderate) ([place]) 12/01/2006  . Morbid obesity ([place]) 08/23/2006  . STATUS, OTHER TOE(S) AMPUTATION 05/21/2006    Current Outpatient Medications on File Prior to Visit  Medication Sig Dispense Refill  . ACCU-CHEK FASTCLIX LANCETS MISC Check blood sugar 5 times a day 408 each 3  . albuterol (PROAIR HFA) 108 (90 Base) MCG/ACT inhaler Inhale 2 puffs into the lungs every 6 (six) hours as needed for wheezing or shortness of breath. 24.7 g 3  . atorvastatin (LIPITOR) 20 MG tablet TAKE 1 TABLET(20 MG) BY MOUTH DAILY 90 tablet 1  . becaplermin (REGRANEX) 0.01 % gel Apply 1 application topically daily. 15 g 5  . Blood Glucose Monitoring Suppl (ACCU-CHEK AVIVA PLUS) w/Device KIT CHECK BLOOD SUGAR 5 TIMES A DAY 1  kit 0  . Cholecalciferol (VITAMIN D3) 2000 UNITS capsule Take 1 capsule (2,000 Units total) by mouth daily. 90 capsule 0  . Continuous Blood Gluc Receiver (FREESTYLE LIBRE 14 DAY READER) DEVI 1 each by Does not apply route 6 (six) times daily for 1 dose. 1 Device 1  . Continuous Blood Gluc Sensor (FREESTYLE LIBRE 14 DAY SENSOR) MISC 1 each by Does not apply route 6 (six) times daily. 2 each 12  . diclofenac sodium (VOLTAREN) 1 % GEL Apply 4 g topically 4 (four) times daily. 100 g 2  . docusate sodium (COLACE) 100 MG capsule Take 1 capsule (100 mg total) by mouth daily as needed for mild constipation. 14 capsule 0  . ELIQUIS 5 MG TABS tablet TAKE 1 TABLET(5 MG) BY MOUTH TWICE DAILY 180 tablet 3  . enalapril (VASOTEC) 20 MG tablet TAKE 2 TABLETS(40 MG) BY MOUTH DAILY 180 tablet 0  . furosemide (LASIX) 40 MG tablet TAKE 1 TABLET(40 MG) BY MOUTH TWICE DAILY 60 tablet 3  . gabapentin (NEURONTIN) 300 MG capsule TAKE 1 CAPSULE(300 MG) BY MOUTH AT BEDTIME 90 capsule 3  . glucose blood (ACCU-CHEK AVIVA PLUS) test strip USE TO CHECK BLOOD Five TIMES DAILY 450 each 3  . glucose blood (FREESTYLE PRECISION NEO TEST) test strip Check blood sugar up to 3 times a day as instructed by the reader 100 each 12  . HUMULIN R 500 UNIT/ML injection DRAW INSULIN TO THE 68 UNIT LINE ON THE U-100 SYRINGE(TO USE 340 UNITS) PER DAY AS DIRECTED 20 mL 3  . HUMULIN R U-500  KWIKPEN 500 UNIT/ML kwikpen INJECT 35 UNITS AT BREAKFAST, 60 UNITS AT LUNCH AND 70 UNITS AT DINNER 24 mL 0  . Insulin Disposable Pump (V-GO 20) KIT USE 1 PUMP DAILY WITH INSULIN 30 kit 0  . Insulin Pen Needle 31G X 5 MM MISC Use for insulin injection 5 times a day. 150 each 5  . INSULIN SYRINGE 1CC/29G 29G X 1/2" 1 ML MISC USE DAILY AS DIRECTED 100 each 0  . Insulin Syringe-Needle U-100 25G X 1" 1 ML MISC Use 1 syringe daily to fill V-Go pump 30 each 3  . metoprolol succinate (TOPROL-XL) 25 MG 24 hr tablet TAKE 1 TABLET(25 MG) BY MOUTH DAILY 90 tablet 0  .  omeprazole (PRILOSEC) 20 MG capsule TAKE 1 CAPSULE(20 MG) BY MOUTH DAILY 90 capsule 0  . promethazine (PHENERGAN) 25 MG tablet TAKE 1 TABLET(25 MG) BY MOUTH EVERY 8 HOURS AS NEEDED FOR NAUSEA OR VOMITING 30 tablet 0  . silver sulfADIAZINE (SILVADENE) 1 % cream Apply 1 application topically daily. 50 g 1  . SIMBRINZA 1-0.2 % SUSP Place 1 drop into both eyes 2 (two) times daily. Take as instructed by Dr. Katy Fitch.    . TOUJEO SOLOSTAR 300 UNIT/ML SOPN INJECT 60 UNITS INTO THE SKIN EVERY MORNING AND 90 UNITS INTO THE SKIN AT NIGHT 9 pen 0  . VICTOZA 18 MG/3ML SOPN ADMINISTER 1.8 MG UNDER THE SKIN DAILY 9 mL 3  . HYDROcodone-acetaminophen (NORCO) 7.5-325 MG tablet Take 1 tablet by mouth every 6 (six) hours as needed for moderate pain. 130 tablet 0   No current facility-administered medications on file prior to visit.     Allergies  Allergen Reactions  . Vancomycin     REACTION: ARF    Objective: There were no vitals filed for this visit.  General: No acute distress, AAOx3  Left foot: Plantar forefoot, wound limited to skin, wound bed measuring 1x1x0.2cm pre and 1.5x1x0.2 post debridement (larger than previous) with mild periwound keratosis, no swelling to left foot, no erythema, no warmth, no active drainage however on inner guaze there is drainage bloody drainage with no acute signs of infection noted, Capillary fill time <3 seconds in all digits remaining, amputation status of 2nd and 3rd toes. No pain with calf compression.   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[place])    -  Primary   PAD (peripheral artery disease) ([place])       Relevant Orders   POCT ABI Screening for [place] No Charge   Diabetes mellitus due to underlying condition with diabetic polyneuropathy, unspecified whether long term insulin use ([place])       Foot amputation status, left (HCC)       PVD (peripheral vascular disease)  ([place])           -Patient seen and evaluated - Excisionally dedbrided ulceration at Left plantar forefoot to healthy bleeding borders removing nonviable tissue using a sterile chisel blade. Wound measures post debridement as above. Wound was debrided to the level of the dermis with viable wound base exposed to promote healing. Hemostasis was achieved with manuel pressure. Patient tolerated procedure well without any discomfort or anesthesia necessary for this wound debridement.  -Applied EPIFIX 85m disk reorder # [id number] 11-10-21 without waste, this is graft #4, secured with adaptic and steristrips to Left foot ulceration and dry sterile dressing  -  Advised patient to go to the ER or return to office if the wound worsens or if constitutional symptoms are present. -Advised patient to make sure to keep dressings clean, dry, and intact allowing nursing to change using adaptic, steristrips, and offloading padding; Resume on Friday like before.  -Advised patient to continue with forefoot offloading post-op shoe on left foot with use of cane -Advised patient to limit activity to necessity  -Advised patient to rest and elevate as necessary -Patient to return for wound check and possible reapplication of EPIFIX in 2-3 weeks. In the meantime, patient to call office if any issues or problems arise.   Landis Martins, DPM

## 2017-08-04 ENCOUNTER — Telehealth: Payer: Self-pay | Admitting: *Deleted

## 2017-08-04 DIAGNOSIS — M79672 Pain in left foot: Secondary | ICD-10-CM | POA: Diagnosis not present

## 2017-08-04 DIAGNOSIS — Z79891 Long term (current) use of opiate analgesic: Secondary | ICD-10-CM | POA: Diagnosis not present

## 2017-08-04 DIAGNOSIS — M545 Low back pain: Secondary | ICD-10-CM | POA: Diagnosis not present

## 2017-08-04 DIAGNOSIS — G894 Chronic pain syndrome: Secondary | ICD-10-CM | POA: Diagnosis not present

## 2017-08-04 NOTE — Telephone Encounter (Signed)
-----   Message from Landis Martins, [place] sent at 08/03/2017  4:55 PM EST ----- Regarding: Home nursing and Reorder Epifix  Nursing to change using adaptic, steristrips, and offloading padding; Resume on Friday like before. Wound measured on today 1.5x1x0.2cm  Reorder 24 mm graft for next visit

## 2017-08-04 NOTE — Telephone Encounter (Signed)
Faxed copy of Dr. Leeanne Rio 08/03/2017 4:55pm orders to [facility]. EpiFix ordered on another telephone message today.

## 2017-08-04 NOTE — Telephone Encounter (Signed)
Emailed order to [facility]. [facility] - [facility] for 18mm EpiFix to be applied in [facility] 08/24/2017.

## 2017-08-05 DIAGNOSIS — E11621 Type 2 diabetes mellitus with foot ulcer: Secondary | ICD-10-CM | POA: Diagnosis not present

## 2017-08-05 DIAGNOSIS — Z794 Long term (current) use of insulin: Secondary | ICD-10-CM | POA: Diagnosis not present

## 2017-08-05 DIAGNOSIS — L97521 Non-pressure chronic ulcer of other part of left foot limited to breakdown of skin: Secondary | ICD-10-CM | POA: Diagnosis not present

## 2017-08-05 DIAGNOSIS — E119 Type 2 diabetes mellitus without complications: Secondary | ICD-10-CM | POA: Diagnosis not present

## 2017-08-06 DIAGNOSIS — E11311 Type 2 diabetes mellitus with unspecified diabetic retinopathy with macular edema: Secondary | ICD-10-CM | POA: Diagnosis not present

## 2017-08-06 DIAGNOSIS — Z89422 Acquired absence of other left toe(s): Secondary | ICD-10-CM | POA: Diagnosis not present

## 2017-08-06 DIAGNOSIS — I129 Hypertensive chronic kidney disease with stage 1 through stage 4 chronic kidney disease, or unspecified chronic kidney disease: Secondary | ICD-10-CM | POA: Diagnosis not present

## 2017-08-06 DIAGNOSIS — Z4800 Encounter for change or removal of nonsurgical wound dressing: Secondary | ICD-10-CM | POA: Diagnosis not present

## 2017-08-06 DIAGNOSIS — E1122 Type 2 diabetes mellitus with diabetic chronic kidney disease: Secondary | ICD-10-CM | POA: Diagnosis not present

## 2017-08-06 DIAGNOSIS — N183 Chronic kidney disease, stage 3 (moderate): Secondary | ICD-10-CM | POA: Diagnosis not present

## 2017-08-06 DIAGNOSIS — Z7901 Long term (current) use of anticoagulants: Secondary | ICD-10-CM | POA: Diagnosis not present

## 2017-08-06 DIAGNOSIS — L97522 Non-pressure chronic ulcer of other part of left foot with fat layer exposed: Secondary | ICD-10-CM | POA: Diagnosis not present

## 2017-08-06 DIAGNOSIS — Z794 Long term (current) use of insulin: Secondary | ICD-10-CM | POA: Diagnosis not present

## 2017-08-06 DIAGNOSIS — E11621 Type 2 diabetes mellitus with foot ulcer: Secondary | ICD-10-CM | POA: Diagnosis not present

## 2017-08-06 DIAGNOSIS — E1142 Type 2 diabetes mellitus with diabetic polyneuropathy: Secondary | ICD-10-CM | POA: Diagnosis not present

## 2017-08-09 DIAGNOSIS — L97522 Non-pressure chronic ulcer of other part of left foot with fat layer exposed: Secondary | ICD-10-CM | POA: Diagnosis not present

## 2017-08-09 DIAGNOSIS — Z4800 Encounter for change or removal of nonsurgical wound dressing: Secondary | ICD-10-CM | POA: Diagnosis not present

## 2017-08-09 DIAGNOSIS — E1122 Type 2 diabetes mellitus with diabetic chronic kidney disease: Secondary | ICD-10-CM | POA: Diagnosis not present

## 2017-08-09 DIAGNOSIS — E1142 Type 2 diabetes mellitus with diabetic polyneuropathy: Secondary | ICD-10-CM | POA: Diagnosis not present

## 2017-08-09 DIAGNOSIS — Z89422 Acquired absence of other left toe(s): Secondary | ICD-10-CM | POA: Diagnosis not present

## 2017-08-09 DIAGNOSIS — Z794 Long term (current) use of insulin: Secondary | ICD-10-CM | POA: Diagnosis not present

## 2017-08-09 DIAGNOSIS — I129 Hypertensive chronic kidney disease with stage 1 through stage 4 chronic kidney disease, or unspecified chronic kidney disease: Secondary | ICD-10-CM | POA: Diagnosis not present

## 2017-08-09 DIAGNOSIS — E11311 Type 2 diabetes mellitus with unspecified diabetic retinopathy with macular edema: Secondary | ICD-10-CM | POA: Diagnosis not present

## 2017-08-09 DIAGNOSIS — Z7901 Long term (current) use of anticoagulants: Secondary | ICD-10-CM | POA: Diagnosis not present

## 2017-08-09 DIAGNOSIS — N183 Chronic kidney disease, stage 3 (moderate): Secondary | ICD-10-CM | POA: Diagnosis not present

## 2017-08-09 DIAGNOSIS — E11621 Type 2 diabetes mellitus with foot ulcer: Secondary | ICD-10-CM | POA: Diagnosis not present

## 2017-08-10 NOTE — Progress Notes (Signed)
Patient ID: [name], male   DOB: [birth year], 67 y.o.   MRN: [id number]           Reason for Appointment: Follow-up for Type 2 Diabetes  Referring physician: Dareen Piano  History of Present Illness:          Date of diagnosis of type 2 diabetes mellitus: 2007       Background history:   He was diagnosed to have diabetes when he had an ulcer on his left third toe which led to amputation.  His glucose was apparently about 400 and he was started on insulin at that time He does not know if he has taken any oral hypoglycemic drugs in the past His A1c in 2016 has been consistently over 8% On his initial consultation was started on Victoza and subsequently started on U-500 insulin in 2/17 along with Toujeo INSULIN regimen previously was:  TOUJEO  74 in the morning and 74 in the evening HUMULIN U-500 insulin: 20/40 units in the morning, 60/70 at lunch and 70 at supper  Recent history:   Since 8/18 has been using V-go PUMP 20 unit basal, Humulin R U-500 insulin with boluses 4 units breakfast, 8 units  10/12 units Changes his pump at 8 am  Non-insulin hypoglycemic drugs the patient is taking are: Victoza 1.8 mg daily        His A1c was 8.0, now 8.3  Current blood sugar patterns and problems identified:  He has been using his freestyle Libre sensor somewhat intermittently because of various issues and now using the 14 day sensor for the last 4 days or so  Although difficult to get a complete picture his blood sugars are HIGHEST in the evenings usually after dinner  This is from various factors such as not bolusing before eating, sometimes bolusing inadequately or sometimes eating higher fat foods; at times he may eat more than expected and does not take supplemental insulin later  However subsequently at night he may occasionally take a correction bolus and  Once this was resulting in hypoglycemia at night. Also occasionally may have occasional high blood sugars after  lunch HYPOGLYCEMIA has occurred sporadically right before suppertime, once overnight and once in the mornings  RECENT average blood sugar is only 157 similar to the previous time  His weight is about the same He has recently seen the dietitian and may be able to start improving his diet   Glucose monitoring:  done 3-4  times a day         Glucometer:  freestyle libre or Accu-Chek     Blood Glucose readings by download:   Mean values apply above for all meters except median for One Touch  PRE-MEAL Fasting Lunch Dinner Bedtime Overall  Glucose range:       Mean/median: 120 133 173 219 157   POST-MEAL PC Breakfast PC Lunch PC Dinner  Glucose range:   143-418  Mean/median:  152 207        Self-care:  Typical meal intake: Breakfast is sometimes fruit or cereal, Sometimes eating sausage and grits Has been trying to avoid high-fat foods or fried food  Breakfast 8 AM Lunch 12 noon Dinner at 5-6 pm He drinks cranberry or V-8 juice and diet drinks                Dietician consultation: 1/19               Exercise: not walking,  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  Latest known visit with results is:  Lab on 07/22/2017  Component Date Value Ref Range Status  . Microalb, Ur 07/22/2017 29.8* 0.0 - 1.9 mg/dL Final  . Creatinine,U 07/22/2017 84.3  mg/dL Final  . Microalb Creat Ratio 07/22/2017 35.3* 0.0 -  30.0 mg/g Final  . Sodium 07/22/2017 139  135 - 145 mEq/L Final  . Potassium 07/22/2017 4.0  3.5 - 5.1 mEq/L Final  . Chloride 07/22/2017 106  96 - 112 mEq/L Final  . CO2 07/22/2017 25  19 - 32 mEq/L Final  . Glucose, Bld 07/22/2017 136* 70 - 99 mg/dL Final  . BUN 07/22/2017 24* 6 - 23 mg/dL Final  . Creatinine, Ser 07/22/2017 1.66* 0.40 - 1.50 mg/dL Final  . Calcium 07/22/2017 8.6  8.4 - 10.5 mg/dL Final  . GFR 07/22/2017 53.35* >60.00 mL/min Final  . Hgb A1c MFr Bld 07/22/2017 8.3* 4.6 - 6.5 % Final   Glycemic Control Guidelines for People with Diabetes:Non Diabetic:  <6%Goal of Therapy: <7%Additional Action Suggested:  >8%        Allergies as of 08/11/2017      Reactions   Vancomycin    REACTION: ARF      Medication List        Accurate as of 08/11/17 11:59 PM. Always use your most recent med list.          ACCU-CHEK AVIVA PLUS w/Device Kit CHECK BLOOD SUGAR 5 TIMES A DAY   ACCU-CHEK FASTCLIX LANCETS Misc Check blood sugar 5 times a day   albuterol 108 (90 Base) MCG/ACT inhaler Commonly known as:  PROAIR HFA Inhale 2 puffs into the lungs every 6 (six) hours as needed for wheezing or shortness of breath.   atorvastatin 20 MG tablet Commonly known as:  LIPITOR TAKE 1 TABLET(20 MG) BY MOUTH DAILY   becaplermin 0.01 % gel Commonly known as:  REGRANEX Apply 1 application topically daily.   diclofenac sodium 1 % Gel Commonly known as:  VOLTAREN Apply 4 g topically 4 (four) times daily.   docusate sodium 100 MG capsule Commonly known as:  COLACE Take 1 capsule (100 mg total) by mouth daily as needed for mild constipation.   ELIQUIS 5 MG Tabs tablet Generic drug:  apixaban TAKE 1 TABLET(5 MG) BY MOUTH TWICE DAILY   enalapril 20 MG tablet Commonly known as:  VASOTEC TAKE 2 TABLETS(40 MG) BY MOUTH DAILY   FREESTYLE LIBRE 14 DAY SENSOR Misc 1 each by Does not apply route 6 (six) times daily.   furosemide 40 MG tablet Commonly known as:  LASIX TAKE 1  TABLET(40 MG) BY MOUTH TWICE DAILY   gabapentin 300 MG capsule Commonly known as:  NEURONTIN TAKE 1 CAPSULE(300 MG) BY MOUTH AT BEDTIME   glucose blood test strip Commonly known as:  ACCU-CHEK AVIVA PLUS USE TO CHECK BLOOD Five TIMES DAILY   glucose blood test strip Commonly known as:  FREESTYLE PRECISION NEO TEST Check blood sugar up to 3 times a day as instructed by the reader   HUMULIN R 500 UNIT/ML injection Generic drug:  insulin regular human CONCENTRATED DRAW INSULIN TO THE 68 UNIT LINE ON THE U-100 SYRINGE(TO USE 340 UNITS) PER DAY AS DIRECTED   HUMULIN R U-500 KWIKPEN 500 UNIT/ML kwikpen Generic drug:  insulin regular human CONCENTRATED INJECT 35 UNITS AT BREAKFAST, 60 UNITS AT LUNCH AND 70 UNITS AT DINNER   HYDROcodone-acetaminophen 7.5-325 MG tablet Commonly known as:  NORCO Take 1 tablet by mouth every 6 (six) hours as needed for moderate pain.   Insulin Pen Needle 31G X 5 MM Misc Use for insulin injection 5 times a day.   Insulin Syringe-Needle U-100 25G X 1" 1 ML Misc Use 1 syringe daily to fill V-Go pump   INSULIN SYRINGE 1CC/29G 29G X 1/2" 1 ML Misc USE DAILY AS DIRECTED   metoprolol succinate 25 MG 24 hr tablet Commonly known as:  TOPROL-XL TAKE 1 TABLET(25 MG) BY MOUTH DAILY   omeprazole 20 MG capsule Commonly known as:  PRILOSEC TAKE 1 CAPSULE(20 MG) BY MOUTH DAILY   promethazine 25 MG tablet Commonly known as:  PHENERGAN TAKE 1 TABLET(25 MG) BY MOUTH EVERY 8 HOURS AS NEEDED FOR NAUSEA OR VOMITING   silver sulfADIAZINE 1 % cream Commonly known as:  SILVADENE Apply 1 application topically daily.   SIMBRINZA 1-0.2 % Susp Generic drug:  Brinzolamide-Brimonidine Place 1 drop into both eyes 2 (two) times daily. Take as instructed by Dr. Katy Fitch.   TOUJEO SOLOSTAR 300 UNIT/ML Sopn Generic drug:  Insulin Glargine INJECT 60 UNITS INTO THE SKIN EVERY MORNING AND 90 UNITS INTO THE SKIN AT NIGHT   V-GO 20 Kit USE 1 PUMP DAILY WITH INSULIN   VICTOZA  18 MG/3ML Sopn Generic drug:  liraglutide ADMINISTER 1.8 MG UNDER THE SKIN DAILY   Vitamin D3 2000 units capsule Take 1 capsule (2,000 Units total) by mouth daily.       Allergies:  Allergies  Allergen Reactions  . Vancomycin     REACTION: ARF    Past Medical History:  Diagnosis Date  . Arthritis    "elbows & knees" (12/13/2014)  . Asthma   . CKD (chronic kidney disease), stage III ([place])   . DIABETIC FOOT ULCER 06/20/2009  . Edema, macular, due to secondary diabetes ([place])   . Erectile dysfunction   . GERD (gastroesophageal reflux disease)   . HEARING LOSS, SENSORINEURAL, BILATERAL 01/03/2007   Seen by ENT Dr. Orpah Greek D. Redmond Baseman 01/03/07  . Hemorrhoids   . History of echocardiogram    a. 04/2008 Echo: EF 50-55%, abnl LV relaxation, mildly dil LA.  Marland Kitchen Hyperlipidemia   . Hypertension   . Morbid obesity ([place])   . Neuropathy, lower extremity   . OSA (obstructive sleep apnea)    uses CPAP nightly  . OSA on CPAP    Nocturnal polysomnogram on 01/21/2010 showed severe obstructive sleep apnea/hypopnea syndrome, AHI 74.1 per hour with non positional events, moderately loud snoring, and oxygen desaturation to a nadir of 78% on room air.  CPAP was successfully titrated to 17 CWP, AHI 1.1 per hour using a large ResMed Mirage Quattro full-face mask with heated humidifier. Bruxism was noted.   . Osteomyelitis of ankle and foot ([place])   . Retinopathy   . Type II diabetes mellitus ([place])  w/complication NOS, type II    Past Surgical History:  Procedure Laterality Date  . AMPUTATION Left 12/15/2014   Procedure: LEFT SECOND TOE AMPUTATION ;  Surgeon: Dorna Leitz, MD;  Location: [facility];  Service: Orthopedics;  Laterality: Left;  . 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 Kathalene Frames. Mayer Camel.  . WOUND DEBRIDEMENT Left 06/15/2016   Procedure: DEBRIDEMENT WOUND LEFT FOOT WITH GRAFT APPLICATION;  Surgeon: Landis Martins, DPM;   Location: [facility];  Service: Podiatry;  Laterality: Left;    Family History  Problem Relation Age of Onset  . Diabetes Mother        also 2 siblings  . Heart attack Father 70  . Throat cancer Brother     Social History:  reports that he quit smoking about 7 years ago. His smoking use included cigarettes. He has a 15.00 pack-year smoking history. he has never used smokeless tobacco. He reports that he drinks about 6.6 oz of alcohol per week. He reports that he does not use drugs.    Review of Systems     Lipid management: taking Lipitor 20 mg, followed by PCP Also has low HDL    Lab Results  Component Value Date   CHOL 116 05/20/2017   HDL 30.80 (L) 05/20/2017   LDLCALC 55 05/20/2017   TRIG 155.0 (H) 05/20/2017   CHOLHDL 4 05/20/2017            Hypertension: Has had hypertension for a few years treated with enalapril,  taking 40 mg  Also on Lasix and 25 mg metoprolol This is treated by PCP/cardiologist  He also has chronic renal dysfunction, stable and mild  Lab Results  Component Value Date   CREATININE 1.66 (H) 07/22/2017   CREATININE 1.74 (H) 05/20/2017   CREATININE 1.80 (H) 04/20/2017     Last foot exam done by podiatrist in 07/2017   Physical Examination:  BP (!) 164/83 (BP Location: Left Arm, Patient Position: Sitting, Cuff Size: Large)   Pulse 82   Ht _0  (1.981 m)   Wt (!) 407 lb 12.8 oz (185 kg)   BMI 47.13 kg/m      ASSESSMENT:  Diabetes type 2, uncontrolled with morbid obesity  See history of present illness for detailed discussion of current diabetes management, blood sugar patterns and problems identified  With V-go pump and using U-500 insulin his blood sugars are overall better controlled  He still has tendency to POSTPRANDIAL hyperglycemia causing his A1c to be still high at 8.3  As  discussed above he is not bolusing enough at suppertime or forgetting to bolus at a time in the evenings at dinnertime Also his diet  has not been consistently improved, may possibly improve since he has seen the dietitian Is blood sugar download from the FreeStyle libre sensor was discussed with him in detail Recently has not had as much hypoglycemia including overnight  Day-to-day management was discussed with him for insulin, boluses, pump management, diet and weight loss  PLAN:     Will need to have him take his BOLUS consistently before eating in the evening  He can take extra 2 units bolus for his evening meal if he has eaten more than expected or eating dessert  Also he can do a supplemental bolus if he sees a blood sugar rising significantly although he  should not take any extra boluses after about 8 PM regardless of  blood sugar level  Also adjust boluses at lunchtime based on what he is planning to eat  He will continue to use a FreeStyle libre sensor.  May have bedtime snack if blood sugar is low normal   Patient Instructions  Bolus at least 6 clicks before supper and extra 1-2 clicks if eating more or sugar rising within 1-2 hrs     Elayne Snare 08/12/2017, 12:05 PM   Note: This office note was prepared with Dragon voice recognition system technology. Any transcriptional errors that result from this process are unintentional.  Counseling time on subjects discussed in assessment and plan sections is over 50% of today's 25 minute visit

## 2017-08-11 ENCOUNTER — Encounter: Payer: Self-pay | Admitting: Endocrinology

## 2017-08-11 ENCOUNTER — Ambulatory Visit (INDEPENDENT_AMBULATORY_CARE_PROVIDER_SITE_OTHER): Payer: Medicare Other | Admitting: Endocrinology

## 2017-08-11 VITALS — BP 164/83 | HR 82 | Ht 78.0 in | Wt >= 6400 oz

## 2017-08-11 DIAGNOSIS — Z4800 Encounter for change or removal of nonsurgical wound dressing: Secondary | ICD-10-CM | POA: Diagnosis not present

## 2017-08-11 DIAGNOSIS — E1142 Type 2 diabetes mellitus with diabetic polyneuropathy: Secondary | ICD-10-CM | POA: Diagnosis not present

## 2017-08-11 DIAGNOSIS — Z7901 Long term (current) use of anticoagulants: Secondary | ICD-10-CM | POA: Diagnosis not present

## 2017-08-11 DIAGNOSIS — I129 Hypertensive chronic kidney disease with stage 1 through stage 4 chronic kidney disease, or unspecified chronic kidney disease: Secondary | ICD-10-CM | POA: Diagnosis not present

## 2017-08-11 DIAGNOSIS — E1165 Type 2 diabetes mellitus with hyperglycemia: Secondary | ICD-10-CM | POA: Diagnosis not present

## 2017-08-11 DIAGNOSIS — N183 Chronic kidney disease, stage 3 (moderate): Secondary | ICD-10-CM | POA: Diagnosis not present

## 2017-08-11 DIAGNOSIS — Z89422 Acquired absence of other left toe(s): Secondary | ICD-10-CM | POA: Diagnosis not present

## 2017-08-11 DIAGNOSIS — E11621 Type 2 diabetes mellitus with foot ulcer: Secondary | ICD-10-CM | POA: Diagnosis not present

## 2017-08-11 DIAGNOSIS — Z794 Long term (current) use of insulin: Secondary | ICD-10-CM | POA: Diagnosis not present

## 2017-08-11 DIAGNOSIS — E1122 Type 2 diabetes mellitus with diabetic chronic kidney disease: Secondary | ICD-10-CM | POA: Diagnosis not present

## 2017-08-11 DIAGNOSIS — E11311 Type 2 diabetes mellitus with unspecified diabetic retinopathy with macular edema: Secondary | ICD-10-CM | POA: Diagnosis not present

## 2017-08-11 DIAGNOSIS — L97522 Non-pressure chronic ulcer of other part of left foot with fat layer exposed: Secondary | ICD-10-CM | POA: Diagnosis not present

## 2017-08-11 NOTE — Patient Instructions (Signed)
Bolus at least 6 clicks before supper and extra 1-2 clicks if eating more or sugar rising within 1-2 hrs

## 2017-08-12 DIAGNOSIS — E113512 Type 2 diabetes mellitus with proliferative diabetic retinopathy with macular edema, left eye: Secondary | ICD-10-CM | POA: Diagnosis not present

## 2017-08-13 DIAGNOSIS — I129 Hypertensive chronic kidney disease with stage 1 through stage 4 chronic kidney disease, or unspecified chronic kidney disease: Secondary | ICD-10-CM | POA: Diagnosis not present

## 2017-08-13 DIAGNOSIS — L97522 Non-pressure chronic ulcer of other part of left foot with fat layer exposed: Secondary | ICD-10-CM | POA: Diagnosis not present

## 2017-08-13 DIAGNOSIS — Z4800 Encounter for change or removal of nonsurgical wound dressing: Secondary | ICD-10-CM | POA: Diagnosis not present

## 2017-08-13 DIAGNOSIS — E1122 Type 2 diabetes mellitus with diabetic chronic kidney disease: Secondary | ICD-10-CM | POA: Diagnosis not present

## 2017-08-13 DIAGNOSIS — Z794 Long term (current) use of insulin: Secondary | ICD-10-CM | POA: Diagnosis not present

## 2017-08-13 DIAGNOSIS — E11311 Type 2 diabetes mellitus with unspecified diabetic retinopathy with macular edema: Secondary | ICD-10-CM | POA: Diagnosis not present

## 2017-08-13 DIAGNOSIS — Z89422 Acquired absence of other left toe(s): Secondary | ICD-10-CM | POA: Diagnosis not present

## 2017-08-13 DIAGNOSIS — E1142 Type 2 diabetes mellitus with diabetic polyneuropathy: Secondary | ICD-10-CM | POA: Diagnosis not present

## 2017-08-13 DIAGNOSIS — N183 Chronic kidney disease, stage 3 (moderate): Secondary | ICD-10-CM | POA: Diagnosis not present

## 2017-08-13 DIAGNOSIS — Z7901 Long term (current) use of anticoagulants: Secondary | ICD-10-CM | POA: Diagnosis not present

## 2017-08-13 DIAGNOSIS — E11621 Type 2 diabetes mellitus with foot ulcer: Secondary | ICD-10-CM | POA: Diagnosis not present

## 2017-08-16 DIAGNOSIS — L97522 Non-pressure chronic ulcer of other part of left foot with fat layer exposed: Secondary | ICD-10-CM | POA: Diagnosis not present

## 2017-08-16 DIAGNOSIS — Z4800 Encounter for change or removal of nonsurgical wound dressing: Secondary | ICD-10-CM | POA: Diagnosis not present

## 2017-08-16 DIAGNOSIS — N183 Chronic kidney disease, stage 3 (moderate): Secondary | ICD-10-CM | POA: Diagnosis not present

## 2017-08-16 DIAGNOSIS — Z794 Long term (current) use of insulin: Secondary | ICD-10-CM | POA: Diagnosis not present

## 2017-08-16 DIAGNOSIS — Z89422 Acquired absence of other left toe(s): Secondary | ICD-10-CM | POA: Diagnosis not present

## 2017-08-16 DIAGNOSIS — E11311 Type 2 diabetes mellitus with unspecified diabetic retinopathy with macular edema: Secondary | ICD-10-CM | POA: Diagnosis not present

## 2017-08-16 DIAGNOSIS — E11621 Type 2 diabetes mellitus with foot ulcer: Secondary | ICD-10-CM | POA: Diagnosis not present

## 2017-08-16 DIAGNOSIS — E1122 Type 2 diabetes mellitus with diabetic chronic kidney disease: Secondary | ICD-10-CM | POA: Diagnosis not present

## 2017-08-16 DIAGNOSIS — Z7901 Long term (current) use of anticoagulants: Secondary | ICD-10-CM | POA: Diagnosis not present

## 2017-08-16 DIAGNOSIS — I129 Hypertensive chronic kidney disease with stage 1 through stage 4 chronic kidney disease, or unspecified chronic kidney disease: Secondary | ICD-10-CM | POA: Diagnosis not present

## 2017-08-16 DIAGNOSIS — E1142 Type 2 diabetes mellitus with diabetic polyneuropathy: Secondary | ICD-10-CM | POA: Diagnosis not present

## 2017-08-18 DIAGNOSIS — Z89422 Acquired absence of other left toe(s): Secondary | ICD-10-CM | POA: Diagnosis not present

## 2017-08-18 DIAGNOSIS — L97522 Non-pressure chronic ulcer of other part of left foot with fat layer exposed: Secondary | ICD-10-CM | POA: Diagnosis not present

## 2017-08-18 DIAGNOSIS — Z7901 Long term (current) use of anticoagulants: Secondary | ICD-10-CM | POA: Diagnosis not present

## 2017-08-18 DIAGNOSIS — Z794 Long term (current) use of insulin: Secondary | ICD-10-CM | POA: Diagnosis not present

## 2017-08-18 DIAGNOSIS — N183 Chronic kidney disease, stage 3 (moderate): Secondary | ICD-10-CM | POA: Diagnosis not present

## 2017-08-18 DIAGNOSIS — E11621 Type 2 diabetes mellitus with foot ulcer: Secondary | ICD-10-CM | POA: Diagnosis not present

## 2017-08-18 DIAGNOSIS — I129 Hypertensive chronic kidney disease with stage 1 through stage 4 chronic kidney disease, or unspecified chronic kidney disease: Secondary | ICD-10-CM | POA: Diagnosis not present

## 2017-08-18 DIAGNOSIS — Z4800 Encounter for change or removal of nonsurgical wound dressing: Secondary | ICD-10-CM | POA: Diagnosis not present

## 2017-08-18 DIAGNOSIS — E1142 Type 2 diabetes mellitus with diabetic polyneuropathy: Secondary | ICD-10-CM | POA: Diagnosis not present

## 2017-08-18 DIAGNOSIS — E11311 Type 2 diabetes mellitus with unspecified diabetic retinopathy with macular edema: Secondary | ICD-10-CM | POA: Diagnosis not present

## 2017-08-18 DIAGNOSIS — E1122 Type 2 diabetes mellitus with diabetic chronic kidney disease: Secondary | ICD-10-CM | POA: Diagnosis not present

## 2017-08-19 DIAGNOSIS — E113311 Type 2 diabetes mellitus with moderate nonproliferative diabetic retinopathy with macular edema, right eye: Secondary | ICD-10-CM | POA: Diagnosis not present

## 2017-08-20 ENCOUNTER — Other Ambulatory Visit: Payer: Self-pay | Admitting: Internal Medicine

## 2017-08-20 DIAGNOSIS — E1122 Type 2 diabetes mellitus with diabetic chronic kidney disease: Secondary | ICD-10-CM | POA: Diagnosis not present

## 2017-08-20 DIAGNOSIS — L97522 Non-pressure chronic ulcer of other part of left foot with fat layer exposed: Secondary | ICD-10-CM | POA: Diagnosis not present

## 2017-08-20 DIAGNOSIS — Z794 Long term (current) use of insulin: Secondary | ICD-10-CM | POA: Diagnosis not present

## 2017-08-20 DIAGNOSIS — Z89422 Acquired absence of other left toe(s): Secondary | ICD-10-CM | POA: Diagnosis not present

## 2017-08-20 DIAGNOSIS — N183 Chronic kidney disease, stage 3 (moderate): Secondary | ICD-10-CM | POA: Diagnosis not present

## 2017-08-20 DIAGNOSIS — Z7901 Long term (current) use of anticoagulants: Secondary | ICD-10-CM | POA: Diagnosis not present

## 2017-08-20 DIAGNOSIS — E1142 Type 2 diabetes mellitus with diabetic polyneuropathy: Secondary | ICD-10-CM | POA: Diagnosis not present

## 2017-08-20 DIAGNOSIS — I129 Hypertensive chronic kidney disease with stage 1 through stage 4 chronic kidney disease, or unspecified chronic kidney disease: Secondary | ICD-10-CM | POA: Diagnosis not present

## 2017-08-20 DIAGNOSIS — E11311 Type 2 diabetes mellitus with unspecified diabetic retinopathy with macular edema: Secondary | ICD-10-CM | POA: Diagnosis not present

## 2017-08-20 DIAGNOSIS — E11621 Type 2 diabetes mellitus with foot ulcer: Secondary | ICD-10-CM | POA: Diagnosis not present

## 2017-08-20 DIAGNOSIS — Z4800 Encounter for change or removal of nonsurgical wound dressing: Secondary | ICD-10-CM | POA: Diagnosis not present

## 2017-08-20 NOTE — Telephone Encounter (Signed)
Based on RF hx, taking QD for past couple months. Not certain of need. Fill now - send back to PCP

## 2017-08-21 ENCOUNTER — Other Ambulatory Visit: Payer: Self-pay | Admitting: Endocrinology

## 2017-08-23 DIAGNOSIS — E1122 Type 2 diabetes mellitus with diabetic chronic kidney disease: Secondary | ICD-10-CM | POA: Diagnosis not present

## 2017-08-23 DIAGNOSIS — I129 Hypertensive chronic kidney disease with stage 1 through stage 4 chronic kidney disease, or unspecified chronic kidney disease: Secondary | ICD-10-CM | POA: Diagnosis not present

## 2017-08-23 DIAGNOSIS — E11311 Type 2 diabetes mellitus with unspecified diabetic retinopathy with macular edema: Secondary | ICD-10-CM | POA: Diagnosis not present

## 2017-08-23 DIAGNOSIS — Z7901 Long term (current) use of anticoagulants: Secondary | ICD-10-CM | POA: Diagnosis not present

## 2017-08-23 DIAGNOSIS — Z89422 Acquired absence of other left toe(s): Secondary | ICD-10-CM | POA: Diagnosis not present

## 2017-08-23 DIAGNOSIS — E11621 Type 2 diabetes mellitus with foot ulcer: Secondary | ICD-10-CM | POA: Diagnosis not present

## 2017-08-23 DIAGNOSIS — E1142 Type 2 diabetes mellitus with diabetic polyneuropathy: Secondary | ICD-10-CM | POA: Diagnosis not present

## 2017-08-23 DIAGNOSIS — L97522 Non-pressure chronic ulcer of other part of left foot with fat layer exposed: Secondary | ICD-10-CM | POA: Diagnosis not present

## 2017-08-23 DIAGNOSIS — N183 Chronic kidney disease, stage 3 (moderate): Secondary | ICD-10-CM | POA: Diagnosis not present

## 2017-08-23 DIAGNOSIS — Z4800 Encounter for change or removal of nonsurgical wound dressing: Secondary | ICD-10-CM | POA: Diagnosis not present

## 2017-08-23 DIAGNOSIS — Z794 Long term (current) use of insulin: Secondary | ICD-10-CM | POA: Diagnosis not present

## 2017-08-24 ENCOUNTER — Telehealth: Payer: Self-pay | Admitting: *Deleted

## 2017-08-24 ENCOUNTER — Ambulatory Visit (INDEPENDENT_AMBULATORY_CARE_PROVIDER_SITE_OTHER): Payer: Medicare Other | Admitting: Sports Medicine

## 2017-08-24 DIAGNOSIS — E0842 Diabetes mellitus due to underlying condition with diabetic polyneuropathy: Secondary | ICD-10-CM | POA: Diagnosis not present

## 2017-08-24 DIAGNOSIS — I739 Peripheral vascular disease, unspecified: Secondary | ICD-10-CM | POA: Diagnosis not present

## 2017-08-24 DIAGNOSIS — IMO0002 Reserved for concepts with insufficient information to code with codable children: Secondary | ICD-10-CM

## 2017-08-24 DIAGNOSIS — Z89432 Acquired absence of left foot: Secondary | ICD-10-CM | POA: Diagnosis not present

## 2017-08-24 DIAGNOSIS — L97522 Non-pressure chronic ulcer of other part of left foot with fat layer exposed: Secondary | ICD-10-CM

## 2017-08-24 NOTE — Telephone Encounter (Signed)
-----   Message from Landis Martins, [place] sent at 08/24/2017  3:26 PM EST ----- Regarding: Epifix Reorder disk for next visit

## 2017-08-24 NOTE — Telephone Encounter (Signed)
Emailed order for 60mm disc EpiFix for application in [facility] 09/14/2017 2:30pm to T. Muncey, [employer].

## 2017-08-24 NOTE — Progress Notes (Signed)
Subjective: [name] is a 67 y.o. male patient seen today in office for POV #36 (DOS 06-15-16), S/P Left wound debridement and placement of stravix allograft. Patient is HERE TODAY FOR ANOTHER EPIFIX GRAFT APPLICATON, denies pain at surgical site, as a baseline 4 out of 10 light achy pain, denies calf pain, denies headache, chest pain, shortness of breath, nausea, vomiting, fever, or chills. No other issues noted.   FBS on insulin pump last blood sugar 116 and A1c of 8.3  On Eliquis   Patient has home nursing assisting with dressing changes.  Patient Active Problem List   Diagnosis Date Noted  . Chronic pain syndrome 09/18/2015  . Preventative health care 03/12/2015  . Foot ulcer, left ([place]) 12/13/2014  . Paroxysmal atrial fibrillation ([place]) 12/13/2014  . Elevated alkaline phosphatase level 06/11/2014  . Constipation 05/31/2014  . Vitamin D deficiency 11/17/2013  . Anemia 02/10/2012  . Hypertension 11/18/2011  . Obstructive sleep apnea 12/12/2009  . GERD 06/27/2008  . Diabetes type 2, uncontrolled ([place]) 12/28/2007  . DIABETIC MACULAR EDEMA 10/07/2007  . Dyslipidemia 08/15/2007  . BACKGROUND DIABETIC RETINOPATHY 08/15/2007  . HEARING LOSS, SENSORINEURAL, BILATERAL 01/03/2007  . Diabetic peripheral neuropathy associated with type 2 diabetes mellitus ([place]) 12/16/2006  . Chronic kidney disease, stage III (moderate) ([place]) 12/01/2006  . Morbid obesity ([place]) 08/23/2006  . STATUS, OTHER TOE(S) AMPUTATION 05/21/2006    Current Outpatient Medications on File Prior to Visit  Medication Sig Dispense Refill  . ACCU-CHEK FASTCLIX LANCETS MISC Check blood sugar 5 times a day 408 each 3  . albuterol (PROAIR HFA) 108 (90 Base) MCG/ACT inhaler Inhale 2 puffs into the lungs every 6 (six) hours as needed for wheezing or shortness of breath. 24.7 g 3  . atorvastatin (LIPITOR) 20 MG tablet TAKE 1 TABLET(20 MG) BY MOUTH DAILY 90 tablet 1  . becaplermin (REGRANEX) 0.01 % gel Apply 1  application topically daily. 15 g 5  . Blood Glucose Monitoring Suppl (ACCU-CHEK AVIVA PLUS) w/Device KIT CHECK BLOOD SUGAR 5 TIMES A DAY 1 kit 0  . Cholecalciferol (VITAMIN D3) 2000 UNITS capsule Take 1 capsule (2,000 Units total) by mouth daily. 90 capsule 0  . Continuous Blood Gluc Sensor (FREESTYLE LIBRE 14 DAY SENSOR) MISC 1 each by Does not apply route 6 (six) times daily. 2 each 12  . diclofenac sodium (VOLTAREN) 1 % GEL Apply 4 g topically 4 (four) times daily. 100 g 2  . docusate sodium (COLACE) 100 MG capsule Take 1 capsule (100 mg total) by mouth daily as needed for mild constipation. 14 capsule 0  . ELIQUIS 5 MG TABS tablet TAKE 1 TABLET(5 MG) BY MOUTH TWICE DAILY 180 tablet 3  . enalapril (VASOTEC) 20 MG tablet TAKE 2 TABLETS(40 MG) BY MOUTH DAILY 180 tablet 0  . furosemide (LASIX) 40 MG tablet TAKE 1 TABLET(40 MG) BY MOUTH TWICE DAILY 60 tablet 3  . gabapentin (NEURONTIN) 300 MG capsule TAKE 1 CAPSULE(300 MG) BY MOUTH AT BEDTIME 90 capsule 3  . glucose blood (ACCU-CHEK AVIVA PLUS) test strip USE TO CHECK BLOOD Five TIMES DAILY 450 each 3  . glucose blood (FREESTYLE PRECISION NEO TEST) test strip Check blood sugar up to 3 times a day as instructed by the reader 100 each 12  . HUMULIN R 500 UNIT/ML injection DRAW INSULIN TO THE 68 UNIT LINE ON THE U-100 SYRINGE(TO USE 340 UNITS) PER DAY AS DIRECTED 20 mL 3  . HUMULIN R U-500 KWIKPEN 500 UNIT/ML  kwikpen INJECT 35 UNITS AT BREAKFAST, 60 UNITS AT LUNCH AND 70 UNITS AT DINNER 24 mL 0  . HYDROcodone-acetaminophen (NORCO) 7.5-325 MG tablet Take 1 tablet by mouth every 6 (six) hours as needed for moderate pain. 130 tablet 0  . Insulin Disposable Pump (V-GO 20) KIT USE 1 PUMP DAILY WITH INSULIN 30 kit 5  . Insulin Pen Needle 31G X 5 MM MISC Use for insulin injection 5 times a day. 150 each 5  . INSULIN SYRINGE 1CC/29G 29G X 1/2" 1 ML MISC USE DAILY AS DIRECTED 100 each 0  . Insulin Syringe-Needle U-100 25G X 1" 1 ML MISC Use 1 syringe daily to  fill V-Go pump 30 each 3  . metoprolol succinate (TOPROL-XL) 25 MG 24 hr tablet TAKE 1 TABLET(25 MG) BY MOUTH DAILY 90 tablet 0  . omeprazole (PRILOSEC) 20 MG capsule TAKE 1 CAPSULE(20 MG) BY MOUTH DAILY 90 capsule 0  . promethazine (PHENERGAN) 25 MG tablet TAKE 1 TABLET(25 MG) BY MOUTH EVERY 8 HOURS AS NEEDED FOR NAUSEA OR VOMITING 30 tablet 0  . silver sulfADIAZINE (SILVADENE) 1 % cream Apply 1 application topically daily. 50 g 1  . SIMBRINZA 1-0.2 % SUSP Place 1 drop into both eyes 2 (two) times daily. Take as instructed by Dr. Katy Fitch.    . TOUJEO SOLOSTAR 300 UNIT/ML SOPN INJECT 60 UNITS INTO THE SKIN EVERY MORNING AND 90 UNITS INTO THE SKIN AT NIGHT 9 pen 0  . VICTOZA 18 MG/3ML SOPN ADMINISTER 1.8 MG UNDER THE SKIN DAILY 9 mL 3   No current facility-administered medications on file prior to visit.     Allergies  Allergen Reactions  . Vancomycin     REACTION: ARF    Objective: There were no vitals filed for this visit.  General: No acute distress, AAOx3  Left foot: Plantar forefoot, wound limited to skin, wound bed measuring 1.6x1x0.2 post debridement (similar to previous) with mild periwound keratosis, no swelling to left foot, no erythema, no warmth, no active drainage however on inner guaze there is drainage bloody drainage with no acute signs of infection noted, Capillary fill time <3 seconds in all digits remaining, amputation status of 2nd and 3rd toes. No pain with calf compression.   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[place])    -  Primary   PAD (peripheral artery disease) ([place])       Diabetes mellitus due to underlying condition with diabetic polyneuropathy, unspecified whether long term insulin use ([place])       Foot amputation status, left ([place])       PVD (peripheral vascular disease) ([place])           -Patient seen and evaluated - Excisionally dedbrided ulceration at Left  plantar forefoot to healthy bleeding borders removing nonviable tissue using a sterile chisel blade. Wound measures post debridement as above. Wound was debrided to the level of the dermis with viable wound base exposed to promote healing. Hemostasis was achieved with manuel pressure. Patient tolerated procedure well without any discomfort or anesthesia necessary for this wound debridement.  -Applied EPIFIX 75m disk reorder # [id number] 01-10-22 without waste, this is graft #5, secured with adaptic and steristrips to Left foot ulceration and dry sterile dressing  - Advised patient to go to the ER or return to office if the wound worsens or if constitutional  symptoms are present. -Advised patient to make sure to keep dressings clean, dry, and intact allowing nursing to change using adaptic, steristrips, and offloading padding; Resume on Friday like previous.  -Advised patient to continue with forefoot offloading post-op shoe on left foot with use of cane -Advised patient to limit activity to necessity  -Advised patient to rest and elevate as necessary -Patient to return for wound check and possible reapplication of EPIFIX vs punch biopsy of wound in 2-3 weeks. In the meantime, patient to call office if any issues or problems arise.   Landis Martins, DPM

## 2017-08-25 ENCOUNTER — Encounter: Payer: Self-pay | Admitting: Sports Medicine

## 2017-08-27 ENCOUNTER — Other Ambulatory Visit: Payer: Self-pay | Admitting: Internal Medicine

## 2017-08-27 DIAGNOSIS — E1142 Type 2 diabetes mellitus with diabetic polyneuropathy: Secondary | ICD-10-CM | POA: Diagnosis not present

## 2017-08-27 DIAGNOSIS — E11621 Type 2 diabetes mellitus with foot ulcer: Secondary | ICD-10-CM | POA: Diagnosis not present

## 2017-08-27 DIAGNOSIS — Z4800 Encounter for change or removal of nonsurgical wound dressing: Secondary | ICD-10-CM | POA: Diagnosis not present

## 2017-08-27 DIAGNOSIS — E1122 Type 2 diabetes mellitus with diabetic chronic kidney disease: Secondary | ICD-10-CM | POA: Diagnosis not present

## 2017-08-27 DIAGNOSIS — Z89422 Acquired absence of other left toe(s): Secondary | ICD-10-CM | POA: Diagnosis not present

## 2017-08-27 DIAGNOSIS — Z7901 Long term (current) use of anticoagulants: Secondary | ICD-10-CM | POA: Diagnosis not present

## 2017-08-27 DIAGNOSIS — Z794 Long term (current) use of insulin: Secondary | ICD-10-CM | POA: Diagnosis not present

## 2017-08-27 DIAGNOSIS — E11311 Type 2 diabetes mellitus with unspecified diabetic retinopathy with macular edema: Secondary | ICD-10-CM | POA: Diagnosis not present

## 2017-08-27 DIAGNOSIS — N183 Chronic kidney disease, stage 3 (moderate): Secondary | ICD-10-CM | POA: Diagnosis not present

## 2017-08-27 DIAGNOSIS — I129 Hypertensive chronic kidney disease with stage 1 through stage 4 chronic kidney disease, or unspecified chronic kidney disease: Secondary | ICD-10-CM | POA: Diagnosis not present

## 2017-08-27 DIAGNOSIS — L97522 Non-pressure chronic ulcer of other part of left foot with fat layer exposed: Secondary | ICD-10-CM | POA: Diagnosis not present

## 2017-08-30 DIAGNOSIS — E11311 Type 2 diabetes mellitus with unspecified diabetic retinopathy with macular edema: Secondary | ICD-10-CM | POA: Diagnosis not present

## 2017-08-30 DIAGNOSIS — Z794 Long term (current) use of insulin: Secondary | ICD-10-CM | POA: Diagnosis not present

## 2017-08-30 DIAGNOSIS — N183 Chronic kidney disease, stage 3 (moderate): Secondary | ICD-10-CM | POA: Diagnosis not present

## 2017-08-30 DIAGNOSIS — E11621 Type 2 diabetes mellitus with foot ulcer: Secondary | ICD-10-CM | POA: Diagnosis not present

## 2017-08-30 DIAGNOSIS — E1142 Type 2 diabetes mellitus with diabetic polyneuropathy: Secondary | ICD-10-CM | POA: Diagnosis not present

## 2017-08-30 DIAGNOSIS — I129 Hypertensive chronic kidney disease with stage 1 through stage 4 chronic kidney disease, or unspecified chronic kidney disease: Secondary | ICD-10-CM | POA: Diagnosis not present

## 2017-08-30 DIAGNOSIS — L97522 Non-pressure chronic ulcer of other part of left foot with fat layer exposed: Secondary | ICD-10-CM | POA: Diagnosis not present

## 2017-08-30 DIAGNOSIS — Z4800 Encounter for change or removal of nonsurgical wound dressing: Secondary | ICD-10-CM | POA: Diagnosis not present

## 2017-08-30 DIAGNOSIS — Z89422 Acquired absence of other left toe(s): Secondary | ICD-10-CM | POA: Diagnosis not present

## 2017-08-30 DIAGNOSIS — Z7901 Long term (current) use of anticoagulants: Secondary | ICD-10-CM | POA: Diagnosis not present

## 2017-08-30 DIAGNOSIS — E1122 Type 2 diabetes mellitus with diabetic chronic kidney disease: Secondary | ICD-10-CM | POA: Diagnosis not present

## 2017-08-31 ENCOUNTER — Other Ambulatory Visit: Payer: Self-pay

## 2017-08-31 ENCOUNTER — Encounter: Payer: Self-pay | Admitting: Internal Medicine

## 2017-08-31 ENCOUNTER — Ambulatory Visit (INDEPENDENT_AMBULATORY_CARE_PROVIDER_SITE_OTHER): Payer: Medicare Other | Admitting: Internal Medicine

## 2017-08-31 ENCOUNTER — Encounter (INDEPENDENT_AMBULATORY_CARE_PROVIDER_SITE_OTHER): Payer: Self-pay

## 2017-08-31 DIAGNOSIS — I10 Essential (primary) hypertension: Secondary | ICD-10-CM

## 2017-08-31 DIAGNOSIS — E11649 Type 2 diabetes mellitus with hypoglycemia without coma: Secondary | ICD-10-CM | POA: Diagnosis not present

## 2017-08-31 DIAGNOSIS — E559 Vitamin D deficiency, unspecified: Secondary | ICD-10-CM

## 2017-08-31 DIAGNOSIS — R748 Abnormal levels of other serum enzymes: Secondary | ICD-10-CM

## 2017-08-31 DIAGNOSIS — E1122 Type 2 diabetes mellitus with diabetic chronic kidney disease: Secondary | ICD-10-CM

## 2017-08-31 DIAGNOSIS — G894 Chronic pain syndrome: Secondary | ICD-10-CM

## 2017-08-31 DIAGNOSIS — I129 Hypertensive chronic kidney disease with stage 1 through stage 4 chronic kidney disease, or unspecified chronic kidney disease: Secondary | ICD-10-CM | POA: Diagnosis not present

## 2017-08-31 DIAGNOSIS — N183 Chronic kidney disease, stage 3 unspecified: Secondary | ICD-10-CM

## 2017-08-31 DIAGNOSIS — L97529 Non-pressure chronic ulcer of other part of left foot with unspecified severity: Secondary | ICD-10-CM

## 2017-08-31 DIAGNOSIS — R110 Nausea: Secondary | ICD-10-CM | POA: Insufficient documentation

## 2017-08-31 DIAGNOSIS — I480 Paroxysmal atrial fibrillation: Secondary | ICD-10-CM | POA: Diagnosis not present

## 2017-08-31 NOTE — Assessment & Plan Note (Signed)
-  This problem is chronic and stable -Patient follows up with Dr. Dwyane Dee (endocrinology) as an outpatient -His last A1c was 8.3. -He is following up with nutritionist at Dr. Ronnie Derby office and make any changes to his diet -Review of his CGM meter shows 60% of his blood sugars are within target range with 5% below target and 32% above target -We will continue with Victoza, insulin via V-Go pump and Toujeo

## 2017-08-31 NOTE — Patient Instructions (Addendum)
-  It was a pleasure seeing you today -Please follow-up with me in 3 months -Please stop taking the promethazine if you are not nauseous.  My hope is to get you off this medication if you no longer need it -Please follow-up with your podiatrist for your left foot ulcer -We will give a handout on advanced directives today and talk about it in detail on our next visit -Please continue to follow with nutritionist and try and lose weight as this will help with your hypertension as well as diabetes

## 2017-08-31 NOTE — Assessment & Plan Note (Signed)
-  Patient has a history of chronic nausea for which he has been on promethazine for years -He states that he takes the promethazine daily whether he has nausea or not -He states that over the last month or 2 he has not noted any nausea but takes this medication anyway -I instructed the patient to stop taking this medication and see if his nausea returns otherwise we will discontinue prescribing this medication

## 2017-08-31 NOTE — Assessment & Plan Note (Signed)
-  Patient states that he has been trying to improve his diet with the help of nutritionist at his endocrinologist office -He is unable to exercise secondary to his left foot ulcer -Importance of losing weight was explained to him again -Patient states that he will try to improve his diet and try to lose weight -We will follow-up with him in 3 months

## 2017-08-31 NOTE — Assessment & Plan Note (Signed)
-  This problem is chronic and stable -He had a BMP done last month which showed a creatinine at his baseline -We will continue to monitor for now -Patient denies any urinary complaints at this time

## 2017-08-31 NOTE — Progress Notes (Signed)
   Subjective:    Patient ID: [name], male    DOB: October 02, 1950, 67 y.o.   MRN: [id number]  HPI  I have seen and examined this patient.  Patient is here for routine follow-up of his hypertension and paroxysmal A. fib.  Patient feels well today with no new complaints.  He is compliant with all his medications.    Review of Systems  Constitutional: Negative.   HENT: Negative.   Respiratory: Negative.   Cardiovascular: Positive for leg swelling.  Gastrointestinal: Negative.   Musculoskeletal: Negative.   Neurological: Negative.   Psychiatric/Behavioral: Negative.        Objective:   Physical Exam  Constitutional: He is oriented to person, place, and time. He appears well-developed and well-nourished.  HENT:  Head: Normocephalic and atraumatic.  Mouth/Throat: No oropharyngeal exudate.  Neck: Neck supple.  Cardiovascular: Normal rate, regular rhythm and normal heart sounds.  Pulmonary/Chest: Effort normal and breath sounds normal. No respiratory distress. He has no wheezes.  Abdominal: Soft. Bowel sounds are normal. He exhibits no distension. There is no tenderness.  Musculoskeletal: Normal range of motion. He exhibits edema.  1+ bilateral lower extremity pitting edema left greater than right Left foot dressing intact  Lymphadenopathy:    He has no cervical adenopathy.  Neurological: He is alert and oriented to person, place, and time.  Psychiatric: He has a normal mood and affect. His behavior is normal.          Assessment & Plan:  Please see problem based charting for assessment and plan:

## 2017-08-31 NOTE — Assessment & Plan Note (Signed)
-  This problem is chronic and stable -Patient is on vitamin D supplementation daily -We will recheck his vitamin D level on his follow-up visit

## 2017-08-31 NOTE — Assessment & Plan Note (Signed)
-  His last alkaline phosphatase level was within normal limits -No further workup for now

## 2017-08-31 NOTE — Assessment & Plan Note (Signed)
-  This problem is chronic and stable -Patient denies any palpitations or lightheadedness or syncope -Continue with Toprol-XL 25 mg and Eliquis 5 mg twice daily

## 2017-08-31 NOTE — Assessment & Plan Note (Signed)
-  This problem is chronic and stable -Patient follows up with podiatry as an outpatient -His left foot dressing is intact -Patient states that the wound does not appear to be getting smaller -He is to follow-up with podiatry soon and possibly have a biopsy done to determine why the wound is healing -We will follow-up podiatry notes

## 2017-08-31 NOTE — Assessment & Plan Note (Signed)
BP Readings from Last 3 Encounters:  08/31/17 (!) 146/72  08/11/17 (!) 164/83  08/03/17 (!) 152/90    Lab Results  Component Value Date   NA 139 07/22/2017   K 4.0 07/22/2017   CREATININE 1.66 (H) 07/22/2017    Assessment: Blood pressure control:  Fair Progress toward BP goal:   Improved Comments: Patient is compliant with enalapril 20 mg, Lasix 40 mg twice daily, Toprol-XL 25 mg  Plan: Medications:  continue current medications Educational resources provided:   Self management tools provided:   Other plans: We will check BMP on follow-up visit.  If patient blood pressure remains high on next visit will consider increasing his enalapril

## 2017-08-31 NOTE — Assessment & Plan Note (Signed)
-  This problem is chronic and stable -Patient states that his pain is well controlled on his current regimen (on Norco) -He continues to follow-up with Dr. Mirna Mires for his chronic pain -No further workup for now

## 2017-09-01 DIAGNOSIS — L97522 Non-pressure chronic ulcer of other part of left foot with fat layer exposed: Secondary | ICD-10-CM | POA: Diagnosis not present

## 2017-09-01 DIAGNOSIS — Z794 Long term (current) use of insulin: Secondary | ICD-10-CM | POA: Diagnosis not present

## 2017-09-01 DIAGNOSIS — I129 Hypertensive chronic kidney disease with stage 1 through stage 4 chronic kidney disease, or unspecified chronic kidney disease: Secondary | ICD-10-CM | POA: Diagnosis not present

## 2017-09-01 DIAGNOSIS — Z79891 Long term (current) use of opiate analgesic: Secondary | ICD-10-CM | POA: Diagnosis not present

## 2017-09-01 DIAGNOSIS — G894 Chronic pain syndrome: Secondary | ICD-10-CM | POA: Diagnosis not present

## 2017-09-01 DIAGNOSIS — E1142 Type 2 diabetes mellitus with diabetic polyneuropathy: Secondary | ICD-10-CM | POA: Diagnosis not present

## 2017-09-01 DIAGNOSIS — E11311 Type 2 diabetes mellitus with unspecified diabetic retinopathy with macular edema: Secondary | ICD-10-CM | POA: Diagnosis not present

## 2017-09-01 DIAGNOSIS — M545 Low back pain: Secondary | ICD-10-CM | POA: Diagnosis not present

## 2017-09-01 DIAGNOSIS — N183 Chronic kidney disease, stage 3 (moderate): Secondary | ICD-10-CM | POA: Diagnosis not present

## 2017-09-01 DIAGNOSIS — Z89422 Acquired absence of other left toe(s): Secondary | ICD-10-CM | POA: Diagnosis not present

## 2017-09-01 DIAGNOSIS — M79672 Pain in left foot: Secondary | ICD-10-CM | POA: Diagnosis not present

## 2017-09-01 DIAGNOSIS — E11621 Type 2 diabetes mellitus with foot ulcer: Secondary | ICD-10-CM | POA: Diagnosis not present

## 2017-09-01 DIAGNOSIS — Z4800 Encounter for change or removal of nonsurgical wound dressing: Secondary | ICD-10-CM | POA: Diagnosis not present

## 2017-09-01 DIAGNOSIS — E1122 Type 2 diabetes mellitus with diabetic chronic kidney disease: Secondary | ICD-10-CM | POA: Diagnosis not present

## 2017-09-01 DIAGNOSIS — Z7901 Long term (current) use of anticoagulants: Secondary | ICD-10-CM | POA: Diagnosis not present

## 2017-09-03 DIAGNOSIS — E11311 Type 2 diabetes mellitus with unspecified diabetic retinopathy with macular edema: Secondary | ICD-10-CM | POA: Diagnosis not present

## 2017-09-03 DIAGNOSIS — E1122 Type 2 diabetes mellitus with diabetic chronic kidney disease: Secondary | ICD-10-CM | POA: Diagnosis not present

## 2017-09-03 DIAGNOSIS — Z7901 Long term (current) use of anticoagulants: Secondary | ICD-10-CM | POA: Diagnosis not present

## 2017-09-03 DIAGNOSIS — Z4800 Encounter for change or removal of nonsurgical wound dressing: Secondary | ICD-10-CM | POA: Diagnosis not present

## 2017-09-03 DIAGNOSIS — E1142 Type 2 diabetes mellitus with diabetic polyneuropathy: Secondary | ICD-10-CM | POA: Diagnosis not present

## 2017-09-03 DIAGNOSIS — Z89422 Acquired absence of other left toe(s): Secondary | ICD-10-CM | POA: Diagnosis not present

## 2017-09-03 DIAGNOSIS — E11621 Type 2 diabetes mellitus with foot ulcer: Secondary | ICD-10-CM | POA: Diagnosis not present

## 2017-09-03 DIAGNOSIS — L97522 Non-pressure chronic ulcer of other part of left foot with fat layer exposed: Secondary | ICD-10-CM | POA: Diagnosis not present

## 2017-09-03 DIAGNOSIS — N183 Chronic kidney disease, stage 3 (moderate): Secondary | ICD-10-CM | POA: Diagnosis not present

## 2017-09-03 DIAGNOSIS — Z794 Long term (current) use of insulin: Secondary | ICD-10-CM | POA: Diagnosis not present

## 2017-09-03 DIAGNOSIS — I129 Hypertensive chronic kidney disease with stage 1 through stage 4 chronic kidney disease, or unspecified chronic kidney disease: Secondary | ICD-10-CM | POA: Diagnosis not present

## 2017-09-06 DIAGNOSIS — G4733 Obstructive sleep apnea (adult) (pediatric): Secondary | ICD-10-CM | POA: Diagnosis not present

## 2017-09-06 DIAGNOSIS — L97522 Non-pressure chronic ulcer of other part of left foot with fat layer exposed: Secondary | ICD-10-CM | POA: Diagnosis not present

## 2017-09-06 DIAGNOSIS — I129 Hypertensive chronic kidney disease with stage 1 through stage 4 chronic kidney disease, or unspecified chronic kidney disease: Secondary | ICD-10-CM | POA: Diagnosis not present

## 2017-09-06 DIAGNOSIS — E1122 Type 2 diabetes mellitus with diabetic chronic kidney disease: Secondary | ICD-10-CM | POA: Diagnosis not present

## 2017-09-06 DIAGNOSIS — N183 Chronic kidney disease, stage 3 (moderate): Secondary | ICD-10-CM | POA: Diagnosis not present

## 2017-09-06 DIAGNOSIS — E11311 Type 2 diabetes mellitus with unspecified diabetic retinopathy with macular edema: Secondary | ICD-10-CM | POA: Diagnosis not present

## 2017-09-06 DIAGNOSIS — E11621 Type 2 diabetes mellitus with foot ulcer: Secondary | ICD-10-CM | POA: Diagnosis not present

## 2017-09-06 DIAGNOSIS — E1142 Type 2 diabetes mellitus with diabetic polyneuropathy: Secondary | ICD-10-CM | POA: Diagnosis not present

## 2017-09-06 DIAGNOSIS — Z7901 Long term (current) use of anticoagulants: Secondary | ICD-10-CM | POA: Diagnosis not present

## 2017-09-06 DIAGNOSIS — Z89422 Acquired absence of other left toe(s): Secondary | ICD-10-CM | POA: Diagnosis not present

## 2017-09-06 DIAGNOSIS — Z794 Long term (current) use of insulin: Secondary | ICD-10-CM | POA: Diagnosis not present

## 2017-09-06 DIAGNOSIS — Z4800 Encounter for change or removal of nonsurgical wound dressing: Secondary | ICD-10-CM | POA: Diagnosis not present

## 2017-09-08 DIAGNOSIS — Z794 Long term (current) use of insulin: Secondary | ICD-10-CM | POA: Diagnosis not present

## 2017-09-08 DIAGNOSIS — Z4800 Encounter for change or removal of nonsurgical wound dressing: Secondary | ICD-10-CM | POA: Diagnosis not present

## 2017-09-08 DIAGNOSIS — L97522 Non-pressure chronic ulcer of other part of left foot with fat layer exposed: Secondary | ICD-10-CM | POA: Diagnosis not present

## 2017-09-08 DIAGNOSIS — N183 Chronic kidney disease, stage 3 (moderate): Secondary | ICD-10-CM | POA: Diagnosis not present

## 2017-09-08 DIAGNOSIS — E1142 Type 2 diabetes mellitus with diabetic polyneuropathy: Secondary | ICD-10-CM | POA: Diagnosis not present

## 2017-09-08 DIAGNOSIS — E1122 Type 2 diabetes mellitus with diabetic chronic kidney disease: Secondary | ICD-10-CM | POA: Diagnosis not present

## 2017-09-08 DIAGNOSIS — Z89422 Acquired absence of other left toe(s): Secondary | ICD-10-CM | POA: Diagnosis not present

## 2017-09-08 DIAGNOSIS — E11621 Type 2 diabetes mellitus with foot ulcer: Secondary | ICD-10-CM | POA: Diagnosis not present

## 2017-09-08 DIAGNOSIS — E11311 Type 2 diabetes mellitus with unspecified diabetic retinopathy with macular edema: Secondary | ICD-10-CM | POA: Diagnosis not present

## 2017-09-08 DIAGNOSIS — Z7901 Long term (current) use of anticoagulants: Secondary | ICD-10-CM | POA: Diagnosis not present

## 2017-09-08 DIAGNOSIS — I129 Hypertensive chronic kidney disease with stage 1 through stage 4 chronic kidney disease, or unspecified chronic kidney disease: Secondary | ICD-10-CM | POA: Diagnosis not present

## 2017-09-10 DIAGNOSIS — Z7901 Long term (current) use of anticoagulants: Secondary | ICD-10-CM | POA: Diagnosis not present

## 2017-09-10 DIAGNOSIS — Z89422 Acquired absence of other left toe(s): Secondary | ICD-10-CM | POA: Diagnosis not present

## 2017-09-10 DIAGNOSIS — E11311 Type 2 diabetes mellitus with unspecified diabetic retinopathy with macular edema: Secondary | ICD-10-CM | POA: Diagnosis not present

## 2017-09-10 DIAGNOSIS — E11621 Type 2 diabetes mellitus with foot ulcer: Secondary | ICD-10-CM | POA: Diagnosis not present

## 2017-09-10 DIAGNOSIS — E1142 Type 2 diabetes mellitus with diabetic polyneuropathy: Secondary | ICD-10-CM | POA: Diagnosis not present

## 2017-09-10 DIAGNOSIS — Z4800 Encounter for change or removal of nonsurgical wound dressing: Secondary | ICD-10-CM | POA: Diagnosis not present

## 2017-09-10 DIAGNOSIS — N183 Chronic kidney disease, stage 3 (moderate): Secondary | ICD-10-CM | POA: Diagnosis not present

## 2017-09-10 DIAGNOSIS — L97522 Non-pressure chronic ulcer of other part of left foot with fat layer exposed: Secondary | ICD-10-CM | POA: Diagnosis not present

## 2017-09-10 DIAGNOSIS — I129 Hypertensive chronic kidney disease with stage 1 through stage 4 chronic kidney disease, or unspecified chronic kidney disease: Secondary | ICD-10-CM | POA: Diagnosis not present

## 2017-09-10 DIAGNOSIS — Z794 Long term (current) use of insulin: Secondary | ICD-10-CM | POA: Diagnosis not present

## 2017-09-10 DIAGNOSIS — E1122 Type 2 diabetes mellitus with diabetic chronic kidney disease: Secondary | ICD-10-CM | POA: Diagnosis not present

## 2017-09-13 DIAGNOSIS — Z89422 Acquired absence of other left toe(s): Secondary | ICD-10-CM | POA: Diagnosis not present

## 2017-09-13 DIAGNOSIS — E1122 Type 2 diabetes mellitus with diabetic chronic kidney disease: Secondary | ICD-10-CM | POA: Diagnosis not present

## 2017-09-13 DIAGNOSIS — Z4800 Encounter for change or removal of nonsurgical wound dressing: Secondary | ICD-10-CM | POA: Diagnosis not present

## 2017-09-13 DIAGNOSIS — I129 Hypertensive chronic kidney disease with stage 1 through stage 4 chronic kidney disease, or unspecified chronic kidney disease: Secondary | ICD-10-CM | POA: Diagnosis not present

## 2017-09-13 DIAGNOSIS — E11311 Type 2 diabetes mellitus with unspecified diabetic retinopathy with macular edema: Secondary | ICD-10-CM | POA: Diagnosis not present

## 2017-09-13 DIAGNOSIS — L97522 Non-pressure chronic ulcer of other part of left foot with fat layer exposed: Secondary | ICD-10-CM | POA: Diagnosis not present

## 2017-09-13 DIAGNOSIS — E1142 Type 2 diabetes mellitus with diabetic polyneuropathy: Secondary | ICD-10-CM | POA: Diagnosis not present

## 2017-09-13 DIAGNOSIS — Z794 Long term (current) use of insulin: Secondary | ICD-10-CM | POA: Diagnosis not present

## 2017-09-13 DIAGNOSIS — E11621 Type 2 diabetes mellitus with foot ulcer: Secondary | ICD-10-CM | POA: Diagnosis not present

## 2017-09-13 DIAGNOSIS — N183 Chronic kidney disease, stage 3 (moderate): Secondary | ICD-10-CM | POA: Diagnosis not present

## 2017-09-13 DIAGNOSIS — Z7901 Long term (current) use of anticoagulants: Secondary | ICD-10-CM | POA: Diagnosis not present

## 2017-09-14 ENCOUNTER — Ambulatory Visit (INDEPENDENT_AMBULATORY_CARE_PROVIDER_SITE_OTHER): Payer: Medicare Other | Admitting: Sports Medicine

## 2017-09-14 ENCOUNTER — Encounter: Payer: Self-pay | Admitting: Sports Medicine

## 2017-09-14 ENCOUNTER — Other Ambulatory Visit: Payer: Self-pay | Admitting: Endocrinology

## 2017-09-14 VITALS — BP 130/74 | HR 73 | Temp 97.9°F

## 2017-09-14 DIAGNOSIS — L929 Granulomatous disorder of the skin and subcutaneous tissue, unspecified: Secondary | ICD-10-CM | POA: Diagnosis not present

## 2017-09-14 DIAGNOSIS — L97522 Non-pressure chronic ulcer of other part of left foot with fat layer exposed: Secondary | ICD-10-CM | POA: Diagnosis not present

## 2017-09-14 DIAGNOSIS — L02416 Cutaneous abscess of left lower limb: Secondary | ICD-10-CM | POA: Diagnosis not present

## 2017-09-14 DIAGNOSIS — I739 Peripheral vascular disease, unspecified: Secondary | ICD-10-CM

## 2017-09-14 DIAGNOSIS — IMO0002 Reserved for concepts with insufficient information to code with codable children: Secondary | ICD-10-CM

## 2017-09-14 DIAGNOSIS — E0842 Diabetes mellitus due to underlying condition with diabetic polyneuropathy: Secondary | ICD-10-CM

## 2017-09-14 DIAGNOSIS — L97529 Non-pressure chronic ulcer of other part of left foot with unspecified severity: Secondary | ICD-10-CM | POA: Diagnosis not present

## 2017-09-14 DIAGNOSIS — R239 Unspecified skin changes: Secondary | ICD-10-CM | POA: Diagnosis not present

## 2017-09-14 NOTE — Progress Notes (Signed)
Subjective: [name] is a 67 y.o. male patient seen today in office for POV #37 (DOS 06-15-16), S/P Left wound debridement and placement of stravix allograft. Patient is HERE TODAY FOR ANOTHER IN-OFFICE EPIFIX GRAFT APPLICATON AFTER #5 was placed last visit, denies pain at surgical site, as a baseline 4 out of 10 light achy pain as previous, denies calf pain, denies headache, chest pain, shortness of breath, nausea, vomiting, fever, or chills. No other issues noted.   FBS on insulin pump last blood sugar  On Eliquis   Patient has home nursing assisting with dressing changes.  Patient Active Problem List   Diagnosis Date Noted  . Nausea 08/31/2017  . Chronic pain syndrome 09/18/2015  . Preventative health care 03/12/2015  . Foot ulcer, left ([place]) 12/13/2014  . Paroxysmal atrial fibrillation ([place]) 12/13/2014  . Vitamin D deficiency 11/17/2013  . Anemia 02/10/2012  . Hypertension 11/18/2011  . Obstructive sleep apnea 12/12/2009  . GERD 06/27/2008  . Diabetes type 2, uncontrolled ([place]) 12/28/2007  . DIABETIC MACULAR EDEMA 10/07/2007  . HEARING LOSS, SENSORINEURAL, BILATERAL 01/03/2007  . Diabetic peripheral neuropathy associated with type 2 diabetes mellitus ([place]) 12/16/2006  . Chronic kidney disease, stage III (moderate) ([place]) 12/01/2006  . Morbid obesity ([place]) 08/23/2006  . STATUS, OTHER TOE(S) AMPUTATION 05/21/2006    Current Outpatient Medications on File Prior to Visit  Medication Sig Dispense Refill  . ACCU-CHEK FASTCLIX LANCETS MISC Check blood sugar 5 times a day 408 each 3  . albuterol (PROAIR HFA) 108 (90 Base) MCG/ACT inhaler Inhale 2 puffs into the lungs every 6 (six) hours as needed for wheezing or shortness of breath. 24.7 g 3  . atorvastatin (LIPITOR) 20 MG tablet TAKE 1 TABLET(20 MG) BY MOUTH DAILY 90 tablet 1  . becaplermin (REGRANEX) 0.01 % gel Apply 1 application topically daily. 15 g 5  . Blood Glucose Monitoring Suppl (ACCU-CHEK AVIVA PLUS)  w/Device KIT CHECK BLOOD SUGAR 5 TIMES A DAY 1 kit 0  . Cholecalciferol (VITAMIN D3) 2000 UNITS capsule Take 1 capsule (2,000 Units total) by mouth daily. 90 capsule 0  . Continuous Blood Gluc Sensor (FREESTYLE LIBRE 14 DAY SENSOR) MISC 1 each by Does not apply route 6 (six) times daily. 2 each 12  . diclofenac sodium (VOLTAREN) 1 % GEL Apply 4 g topically 4 (four) times daily. 100 g 2  . docusate sodium (COLACE) 100 MG capsule Take 1 capsule (100 mg total) by mouth daily as needed for mild constipation. 14 capsule 0  . ELIQUIS 5 MG TABS tablet TAKE 1 TABLET(5 MG) BY MOUTH TWICE DAILY 180 tablet 3  . enalapril (VASOTEC) 20 MG tablet TAKE 2 TABLETS(40 MG) BY MOUTH DAILY 180 tablet 0  . furosemide (LASIX) 40 MG tablet TAKE 1 TABLET(40 MG) BY MOUTH TWICE DAILY 60 tablet 3  . gabapentin (NEURONTIN) 300 MG capsule TAKE 1 CAPSULE(300 MG) BY MOUTH AT BEDTIME 90 capsule 3  . glucose blood (ACCU-CHEK AVIVA PLUS) test strip USE TO CHECK BLOOD Five TIMES DAILY 450 each 3  . glucose blood (FREESTYLE PRECISION NEO TEST) test strip Check blood sugar up to 3 times a day as instructed by the reader 100 each 12  . HUMULIN R U-500 KWIKPEN 500 UNIT/ML kwikpen INJECT 35 UNITS AT BREAKFAST, 60 UNITS AT LUNCH AND 70 UNITS AT DINNER 24 mL 0  . HYDROcodone-acetaminophen (NORCO) 7.5-325 MG tablet Take 1 tablet by mouth every 6 (six) hours as needed for moderate pain. 130 tablet  0  . Insulin Disposable Pump (V-GO 20) KIT USE 1 PUMP DAILY WITH INSULIN 30 kit 5  . Insulin Pen Needle 31G X 5 MM MISC Use for insulin injection 5 times a day. 150 each 5  . INSULIN SYRINGE 1CC/29G 29G X 1/2" 1 ML MISC USE DAILY AS DIRECTED 100 each 0  . Insulin Syringe-Needle U-100 25G X 1" 1 ML MISC Use 1 syringe daily to fill V-Go pump 30 each 3  . metoprolol succinate (TOPROL-XL) 25 MG 24 hr tablet TAKE 1 TABLET(25 MG) BY MOUTH DAILY 90 tablet 0  . omeprazole (PRILOSEC) 20 MG capsule TAKE 1 CAPSULE(20 MG) BY MOUTH DAILY 90 capsule 0  .  promethazine (PHENERGAN) 25 MG tablet TAKE 1 TABLET(25 MG) BY MOUTH EVERY 8 HOURS AS NEEDED FOR NAUSEA OR VOMITING 30 tablet 0  . silver sulfADIAZINE (SILVADENE) 1 % cream Apply 1 application topically daily. 50 g 1  . SIMBRINZA 1-0.2 % SUSP Place 1 drop into both eyes 2 (two) times daily. Take as instructed by Dr. Katy Fitch.    . TOUJEO SOLOSTAR 300 UNIT/ML SOPN INJECT 60 UNITS INTO THE SKIN EVERY MORNING AND 90 UNITS INTO THE SKIN AT NIGHT 9 pen 0  . VICTOZA 18 MG/3ML SOPN ADMINISTER 1.8 MG UNDER THE SKIN DAILY 9 mL 3   No current facility-administered medications on file prior to visit.     Allergies  Allergen Reactions  . Vancomycin     REACTION: ARF    Objective: There were no vitals filed for this visit.  General: No acute distress, AAOx3  Left foot: Plantar forefoot, wound limited to skin, wound bed measuring 1.5x1x0.2 post debridement (similar to previous) with mild periwound keratosis, no swelling to left foot, no erythema, no warmth, no active drainage however on inner guaze there is drainage bloody drainage with no acute signs of infection noted, Capillary fill time <3 seconds in all digits remaining, amputation status of 2nd and 3rd toes. No pain with calf compression.   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[place])  (Chronic)   -  Primary   Relevant Orders   Dermatology pathology   PAD (peripheral artery disease) ([place])       Diabetes mellitus due to underlying condition with diabetic polyneuropathy, unspecified whether long term insulin use ([place])       Foot amputation status, left ([place])       PVD (peripheral vascular disease) ([place])           -Patient seen and evaluated -Discussed treatment options; since wound has stalled will biopsy and have wound bed checked for any possible thing that could be causing delayed healing - Excisionally dedbrided ulceration at Left plantar  forefoot to healthy bleeding borders removing nonviable tissue using a sterile chisel blade then after aseptic prep took 2.5 mm punch and took several biopsies of the wound bed and sent to pathology. Wound measures post debridement as above. Hemostasis was achieved with manuel pressure. Patient tolerated procedure well without any discomfort or anesthesia necessary for this wound debridement and biopsy. -Applied Iodosorb and dry dressings to Left foot ulceration  - Advised patient to go to the ER or return to office if the wound worsens or if constitutional symptoms are present. -Advised patient to make sure to keep dressings clean, dry, and intact allowing nursing to change using iodosorb, and offloading padding;  Resume on Friday like previous.  -Advised patient to continue with forefoot offloading post-op shoe on left foot with use of cane -Advised patient to limit activity to necessity  -Advised patient to rest and elevate as necessary -Patient to return for wound check and discussion of biopsy results in 2-3 weeks. In the meantime, patient to call office if any issues or problems arise.   Landis Martins, DPM

## 2017-09-15 ENCOUNTER — Telehealth: Payer: Self-pay | Admitting: *Deleted

## 2017-09-15 NOTE — Telephone Encounter (Signed)
-----   Message from Landis Martins, [place] sent at 09/14/2017  3:11 PM EST ----- Regarding: Wound care orders  On Friday apply iodosorb and dry dressing to left foot ulceration. Change 3x per week until next office visit Dr Chauncey Cruel

## 2017-09-15 NOTE — Telephone Encounter (Signed)
Emailed T. Valarie Cones - [facility] that 09/14/2017 appt EpiFix was not used may pick up.

## 2017-09-15 NOTE — Telephone Encounter (Signed)
Faxed wound care orders to [facility] 

## 2017-09-15 NOTE — Telephone Encounter (Signed)
-----   Message from Landis Martins, [place] sent at 09/14/2017 11:20 PM EST ----- Regarding: Epifix  Graft was NOT used 09-14-17 office visit. May return. Wound care orders: Iodosorb and dry dressing 3x per week by home nursing may resume on Friday -Dr. Chauncey Cruel

## 2017-09-16 DIAGNOSIS — E113512 Type 2 diabetes mellitus with proliferative diabetic retinopathy with macular edema, left eye: Secondary | ICD-10-CM | POA: Diagnosis not present

## 2017-09-16 DIAGNOSIS — H40053 Ocular hypertension, bilateral: Secondary | ICD-10-CM | POA: Diagnosis not present

## 2017-09-17 DIAGNOSIS — Z89422 Acquired absence of other left toe(s): Secondary | ICD-10-CM | POA: Diagnosis not present

## 2017-09-17 DIAGNOSIS — N183 Chronic kidney disease, stage 3 (moderate): Secondary | ICD-10-CM | POA: Diagnosis not present

## 2017-09-17 DIAGNOSIS — E1142 Type 2 diabetes mellitus with diabetic polyneuropathy: Secondary | ICD-10-CM | POA: Diagnosis not present

## 2017-09-17 DIAGNOSIS — E11621 Type 2 diabetes mellitus with foot ulcer: Secondary | ICD-10-CM | POA: Diagnosis not present

## 2017-09-17 DIAGNOSIS — Z4800 Encounter for change or removal of nonsurgical wound dressing: Secondary | ICD-10-CM | POA: Diagnosis not present

## 2017-09-17 DIAGNOSIS — I129 Hypertensive chronic kidney disease with stage 1 through stage 4 chronic kidney disease, or unspecified chronic kidney disease: Secondary | ICD-10-CM | POA: Diagnosis not present

## 2017-09-17 DIAGNOSIS — E11311 Type 2 diabetes mellitus with unspecified diabetic retinopathy with macular edema: Secondary | ICD-10-CM | POA: Diagnosis not present

## 2017-09-17 DIAGNOSIS — L97522 Non-pressure chronic ulcer of other part of left foot with fat layer exposed: Secondary | ICD-10-CM | POA: Diagnosis not present

## 2017-09-17 DIAGNOSIS — Z794 Long term (current) use of insulin: Secondary | ICD-10-CM | POA: Diagnosis not present

## 2017-09-17 DIAGNOSIS — E1122 Type 2 diabetes mellitus with diabetic chronic kidney disease: Secondary | ICD-10-CM | POA: Diagnosis not present

## 2017-09-17 DIAGNOSIS — Z7901 Long term (current) use of anticoagulants: Secondary | ICD-10-CM | POA: Diagnosis not present

## 2017-09-17 NOTE — Telephone Encounter (Signed)
Home nurse called the answering service and wanted clarification on what she can use to substitute for Iodosorb since she has ordered it and it will not be in until Monday.  I advised nurse that she can use povidone to the wound until the Iodosorb comes in. -Dr. Cannon Kettle

## 2017-09-20 DIAGNOSIS — L97522 Non-pressure chronic ulcer of other part of left foot with fat layer exposed: Secondary | ICD-10-CM | POA: Diagnosis not present

## 2017-09-20 DIAGNOSIS — E11311 Type 2 diabetes mellitus with unspecified diabetic retinopathy with macular edema: Secondary | ICD-10-CM | POA: Diagnosis not present

## 2017-09-20 DIAGNOSIS — Z4800 Encounter for change or removal of nonsurgical wound dressing: Secondary | ICD-10-CM | POA: Diagnosis not present

## 2017-09-20 DIAGNOSIS — G4733 Obstructive sleep apnea (adult) (pediatric): Secondary | ICD-10-CM | POA: Diagnosis not present

## 2017-09-20 DIAGNOSIS — E11621 Type 2 diabetes mellitus with foot ulcer: Secondary | ICD-10-CM | POA: Diagnosis not present

## 2017-09-20 DIAGNOSIS — E1122 Type 2 diabetes mellitus with diabetic chronic kidney disease: Secondary | ICD-10-CM | POA: Diagnosis not present

## 2017-09-20 DIAGNOSIS — N183 Chronic kidney disease, stage 3 (moderate): Secondary | ICD-10-CM | POA: Diagnosis not present

## 2017-09-20 DIAGNOSIS — I129 Hypertensive chronic kidney disease with stage 1 through stage 4 chronic kidney disease, or unspecified chronic kidney disease: Secondary | ICD-10-CM | POA: Diagnosis not present

## 2017-09-20 DIAGNOSIS — Z89422 Acquired absence of other left toe(s): Secondary | ICD-10-CM | POA: Diagnosis not present

## 2017-09-20 DIAGNOSIS — E1142 Type 2 diabetes mellitus with diabetic polyneuropathy: Secondary | ICD-10-CM | POA: Diagnosis not present

## 2017-09-20 DIAGNOSIS — Z7901 Long term (current) use of anticoagulants: Secondary | ICD-10-CM | POA: Diagnosis not present

## 2017-09-20 DIAGNOSIS — Z794 Long term (current) use of insulin: Secondary | ICD-10-CM | POA: Diagnosis not present

## 2017-09-22 DIAGNOSIS — L97522 Non-pressure chronic ulcer of other part of left foot with fat layer exposed: Secondary | ICD-10-CM | POA: Diagnosis not present

## 2017-09-22 DIAGNOSIS — E1142 Type 2 diabetes mellitus with diabetic polyneuropathy: Secondary | ICD-10-CM | POA: Diagnosis not present

## 2017-09-22 DIAGNOSIS — Z7901 Long term (current) use of anticoagulants: Secondary | ICD-10-CM | POA: Diagnosis not present

## 2017-09-22 DIAGNOSIS — Z4800 Encounter for change or removal of nonsurgical wound dressing: Secondary | ICD-10-CM | POA: Diagnosis not present

## 2017-09-22 DIAGNOSIS — E1122 Type 2 diabetes mellitus with diabetic chronic kidney disease: Secondary | ICD-10-CM | POA: Diagnosis not present

## 2017-09-22 DIAGNOSIS — Z89422 Acquired absence of other left toe(s): Secondary | ICD-10-CM | POA: Diagnosis not present

## 2017-09-22 DIAGNOSIS — N183 Chronic kidney disease, stage 3 (moderate): Secondary | ICD-10-CM | POA: Diagnosis not present

## 2017-09-22 DIAGNOSIS — E11311 Type 2 diabetes mellitus with unspecified diabetic retinopathy with macular edema: Secondary | ICD-10-CM | POA: Diagnosis not present

## 2017-09-22 DIAGNOSIS — Z794 Long term (current) use of insulin: Secondary | ICD-10-CM | POA: Diagnosis not present

## 2017-09-22 DIAGNOSIS — I129 Hypertensive chronic kidney disease with stage 1 through stage 4 chronic kidney disease, or unspecified chronic kidney disease: Secondary | ICD-10-CM | POA: Diagnosis not present

## 2017-09-22 DIAGNOSIS — E11621 Type 2 diabetes mellitus with foot ulcer: Secondary | ICD-10-CM | POA: Diagnosis not present

## 2017-09-24 DIAGNOSIS — N183 Chronic kidney disease, stage 3 (moderate): Secondary | ICD-10-CM | POA: Diagnosis not present

## 2017-09-24 DIAGNOSIS — L97522 Non-pressure chronic ulcer of other part of left foot with fat layer exposed: Secondary | ICD-10-CM | POA: Diagnosis not present

## 2017-09-24 DIAGNOSIS — I129 Hypertensive chronic kidney disease with stage 1 through stage 4 chronic kidney disease, or unspecified chronic kidney disease: Secondary | ICD-10-CM | POA: Diagnosis not present

## 2017-09-24 DIAGNOSIS — E1142 Type 2 diabetes mellitus with diabetic polyneuropathy: Secondary | ICD-10-CM | POA: Diagnosis not present

## 2017-09-24 DIAGNOSIS — E11311 Type 2 diabetes mellitus with unspecified diabetic retinopathy with macular edema: Secondary | ICD-10-CM | POA: Diagnosis not present

## 2017-09-24 DIAGNOSIS — Z794 Long term (current) use of insulin: Secondary | ICD-10-CM | POA: Diagnosis not present

## 2017-09-24 DIAGNOSIS — E11621 Type 2 diabetes mellitus with foot ulcer: Secondary | ICD-10-CM | POA: Diagnosis not present

## 2017-09-24 DIAGNOSIS — Z89422 Acquired absence of other left toe(s): Secondary | ICD-10-CM | POA: Diagnosis not present

## 2017-09-24 DIAGNOSIS — Z4800 Encounter for change or removal of nonsurgical wound dressing: Secondary | ICD-10-CM | POA: Diagnosis not present

## 2017-09-24 DIAGNOSIS — E1122 Type 2 diabetes mellitus with diabetic chronic kidney disease: Secondary | ICD-10-CM | POA: Diagnosis not present

## 2017-09-24 DIAGNOSIS — Z7901 Long term (current) use of anticoagulants: Secondary | ICD-10-CM | POA: Diagnosis not present

## 2017-09-27 DIAGNOSIS — E11311 Type 2 diabetes mellitus with unspecified diabetic retinopathy with macular edema: Secondary | ICD-10-CM | POA: Diagnosis not present

## 2017-09-27 DIAGNOSIS — E1142 Type 2 diabetes mellitus with diabetic polyneuropathy: Secondary | ICD-10-CM | POA: Diagnosis not present

## 2017-09-27 DIAGNOSIS — E11621 Type 2 diabetes mellitus with foot ulcer: Secondary | ICD-10-CM | POA: Diagnosis not present

## 2017-09-27 DIAGNOSIS — E1122 Type 2 diabetes mellitus with diabetic chronic kidney disease: Secondary | ICD-10-CM | POA: Diagnosis not present

## 2017-09-27 DIAGNOSIS — N183 Chronic kidney disease, stage 3 (moderate): Secondary | ICD-10-CM | POA: Diagnosis not present

## 2017-09-27 DIAGNOSIS — Z4800 Encounter for change or removal of nonsurgical wound dressing: Secondary | ICD-10-CM | POA: Diagnosis not present

## 2017-09-27 DIAGNOSIS — Z89422 Acquired absence of other left toe(s): Secondary | ICD-10-CM | POA: Diagnosis not present

## 2017-09-27 DIAGNOSIS — L97522 Non-pressure chronic ulcer of other part of left foot with fat layer exposed: Secondary | ICD-10-CM | POA: Diagnosis not present

## 2017-09-27 DIAGNOSIS — Z794 Long term (current) use of insulin: Secondary | ICD-10-CM | POA: Diagnosis not present

## 2017-09-27 DIAGNOSIS — I129 Hypertensive chronic kidney disease with stage 1 through stage 4 chronic kidney disease, or unspecified chronic kidney disease: Secondary | ICD-10-CM | POA: Diagnosis not present

## 2017-09-27 DIAGNOSIS — Z7901 Long term (current) use of anticoagulants: Secondary | ICD-10-CM | POA: Diagnosis not present

## 2017-09-29 DIAGNOSIS — M79672 Pain in left foot: Secondary | ICD-10-CM | POA: Diagnosis not present

## 2017-09-29 DIAGNOSIS — Z4800 Encounter for change or removal of nonsurgical wound dressing: Secondary | ICD-10-CM | POA: Diagnosis not present

## 2017-09-29 DIAGNOSIS — Z89422 Acquired absence of other left toe(s): Secondary | ICD-10-CM | POA: Diagnosis not present

## 2017-09-29 DIAGNOSIS — L97522 Non-pressure chronic ulcer of other part of left foot with fat layer exposed: Secondary | ICD-10-CM | POA: Diagnosis not present

## 2017-09-29 DIAGNOSIS — M545 Low back pain: Secondary | ICD-10-CM | POA: Diagnosis not present

## 2017-09-29 DIAGNOSIS — E11311 Type 2 diabetes mellitus with unspecified diabetic retinopathy with macular edema: Secondary | ICD-10-CM | POA: Diagnosis not present

## 2017-09-29 DIAGNOSIS — E1122 Type 2 diabetes mellitus with diabetic chronic kidney disease: Secondary | ICD-10-CM | POA: Diagnosis not present

## 2017-09-29 DIAGNOSIS — N183 Chronic kidney disease, stage 3 (moderate): Secondary | ICD-10-CM | POA: Diagnosis not present

## 2017-09-29 DIAGNOSIS — Z7901 Long term (current) use of anticoagulants: Secondary | ICD-10-CM | POA: Diagnosis not present

## 2017-09-29 DIAGNOSIS — I129 Hypertensive chronic kidney disease with stage 1 through stage 4 chronic kidney disease, or unspecified chronic kidney disease: Secondary | ICD-10-CM | POA: Diagnosis not present

## 2017-09-29 DIAGNOSIS — E1142 Type 2 diabetes mellitus with diabetic polyneuropathy: Secondary | ICD-10-CM | POA: Diagnosis not present

## 2017-09-29 DIAGNOSIS — Z79891 Long term (current) use of opiate analgesic: Secondary | ICD-10-CM | POA: Diagnosis not present

## 2017-09-29 DIAGNOSIS — E11621 Type 2 diabetes mellitus with foot ulcer: Secondary | ICD-10-CM | POA: Diagnosis not present

## 2017-09-29 DIAGNOSIS — G894 Chronic pain syndrome: Secondary | ICD-10-CM | POA: Diagnosis not present

## 2017-09-29 DIAGNOSIS — Z794 Long term (current) use of insulin: Secondary | ICD-10-CM | POA: Diagnosis not present

## 2017-10-01 DIAGNOSIS — E11621 Type 2 diabetes mellitus with foot ulcer: Secondary | ICD-10-CM | POA: Diagnosis not present

## 2017-10-01 DIAGNOSIS — Z7901 Long term (current) use of anticoagulants: Secondary | ICD-10-CM | POA: Diagnosis not present

## 2017-10-01 DIAGNOSIS — N183 Chronic kidney disease, stage 3 (moderate): Secondary | ICD-10-CM | POA: Diagnosis not present

## 2017-10-01 DIAGNOSIS — Z794 Long term (current) use of insulin: Secondary | ICD-10-CM | POA: Diagnosis not present

## 2017-10-01 DIAGNOSIS — E1142 Type 2 diabetes mellitus with diabetic polyneuropathy: Secondary | ICD-10-CM | POA: Diagnosis not present

## 2017-10-01 DIAGNOSIS — E11311 Type 2 diabetes mellitus with unspecified diabetic retinopathy with macular edema: Secondary | ICD-10-CM | POA: Diagnosis not present

## 2017-10-01 DIAGNOSIS — Z4800 Encounter for change or removal of nonsurgical wound dressing: Secondary | ICD-10-CM | POA: Diagnosis not present

## 2017-10-01 DIAGNOSIS — L97522 Non-pressure chronic ulcer of other part of left foot with fat layer exposed: Secondary | ICD-10-CM | POA: Diagnosis not present

## 2017-10-01 DIAGNOSIS — I129 Hypertensive chronic kidney disease with stage 1 through stage 4 chronic kidney disease, or unspecified chronic kidney disease: Secondary | ICD-10-CM | POA: Diagnosis not present

## 2017-10-01 DIAGNOSIS — E1122 Type 2 diabetes mellitus with diabetic chronic kidney disease: Secondary | ICD-10-CM | POA: Diagnosis not present

## 2017-10-01 DIAGNOSIS — Z89422 Acquired absence of other left toe(s): Secondary | ICD-10-CM | POA: Diagnosis not present

## 2017-10-04 DIAGNOSIS — L97522 Non-pressure chronic ulcer of other part of left foot with fat layer exposed: Secondary | ICD-10-CM | POA: Diagnosis not present

## 2017-10-04 DIAGNOSIS — Z89422 Acquired absence of other left toe(s): Secondary | ICD-10-CM | POA: Diagnosis not present

## 2017-10-04 DIAGNOSIS — E1122 Type 2 diabetes mellitus with diabetic chronic kidney disease: Secondary | ICD-10-CM | POA: Diagnosis not present

## 2017-10-04 DIAGNOSIS — Z4800 Encounter for change or removal of nonsurgical wound dressing: Secondary | ICD-10-CM | POA: Diagnosis not present

## 2017-10-04 DIAGNOSIS — E11311 Type 2 diabetes mellitus with unspecified diabetic retinopathy with macular edema: Secondary | ICD-10-CM | POA: Diagnosis not present

## 2017-10-04 DIAGNOSIS — E1142 Type 2 diabetes mellitus with diabetic polyneuropathy: Secondary | ICD-10-CM | POA: Diagnosis not present

## 2017-10-04 DIAGNOSIS — E11621 Type 2 diabetes mellitus with foot ulcer: Secondary | ICD-10-CM | POA: Diagnosis not present

## 2017-10-04 DIAGNOSIS — I129 Hypertensive chronic kidney disease with stage 1 through stage 4 chronic kidney disease, or unspecified chronic kidney disease: Secondary | ICD-10-CM | POA: Diagnosis not present

## 2017-10-04 DIAGNOSIS — Z794 Long term (current) use of insulin: Secondary | ICD-10-CM | POA: Diagnosis not present

## 2017-10-04 DIAGNOSIS — N183 Chronic kidney disease, stage 3 (moderate): Secondary | ICD-10-CM | POA: Diagnosis not present

## 2017-10-04 DIAGNOSIS — Z7901 Long term (current) use of anticoagulants: Secondary | ICD-10-CM | POA: Diagnosis not present

## 2017-10-05 ENCOUNTER — Ambulatory Visit (INDEPENDENT_AMBULATORY_CARE_PROVIDER_SITE_OTHER): Payer: Medicare Other | Admitting: Sports Medicine

## 2017-10-05 ENCOUNTER — Encounter: Payer: Self-pay | Admitting: Sports Medicine

## 2017-10-05 VITALS — BP 139/77 | HR 74

## 2017-10-05 DIAGNOSIS — Z89432 Acquired absence of left foot: Secondary | ICD-10-CM | POA: Diagnosis not present

## 2017-10-05 DIAGNOSIS — L97522 Non-pressure chronic ulcer of other part of left foot with fat layer exposed: Secondary | ICD-10-CM

## 2017-10-05 DIAGNOSIS — IMO0002 Reserved for concepts with insufficient information to code with codable children: Secondary | ICD-10-CM

## 2017-10-05 DIAGNOSIS — I739 Peripheral vascular disease, unspecified: Secondary | ICD-10-CM | POA: Diagnosis not present

## 2017-10-05 DIAGNOSIS — E0842 Diabetes mellitus due to underlying condition with diabetic polyneuropathy: Secondary | ICD-10-CM

## 2017-10-05 NOTE — Progress Notes (Signed)
Subjective: [name] is a 67 y.o. male patient seen today in office for left foot wound care and for discussion of biopsy results; Denies pain at surgical/wound bed site, as a baseline 4 out of 10 light achy pain as previous, denies calf pain, denies headache, chest pain, shortness of breath, nausea, vomiting, fever, or chills. No other issues noted.   FBS on insulin pump last blood sugar  On Eliquis   Patient has home nursing assisting with dressing changes.  Patient Active Problem List   Diagnosis Date Noted  . Nausea 08/31/2017  . Chronic pain syndrome 09/18/2015  . Preventative health care 03/12/2015  . Foot ulcer, left ([place]) 12/13/2014  . Paroxysmal atrial fibrillation ([place]) 12/13/2014  . Vitamin D deficiency 11/17/2013  . Anemia 02/10/2012  . Hypertension 11/18/2011  . Obstructive sleep apnea 12/12/2009  . GERD 06/27/2008  . Diabetes type 2, uncontrolled ([place]) 12/28/2007  . DIABETIC MACULAR EDEMA 10/07/2007  . HEARING LOSS, SENSORINEURAL, BILATERAL 01/03/2007  . Diabetic peripheral neuropathy associated with type 2 diabetes mellitus ([place]) 12/16/2006  . Chronic kidney disease, stage III (moderate) ([place]) 12/01/2006  . Morbid obesity ([place]) 08/23/2006  . STATUS, OTHER TOE(S) AMPUTATION 05/21/2006    Current Outpatient Medications on File Prior to Visit  Medication Sig Dispense Refill  . ACCU-CHEK FASTCLIX LANCETS MISC Check blood sugar 5 times a day 408 each 3  . albuterol (PROAIR HFA) 108 (90 Base) MCG/ACT inhaler Inhale 2 puffs into the lungs every 6 (six) hours as needed for wheezing or shortness of breath. 24.7 g 3  . atorvastatin (LIPITOR) 20 MG tablet TAKE 1 TABLET(20 MG) BY MOUTH DAILY 90 tablet 1  . becaplermin (REGRANEX) 0.01 % gel Apply 1 application topically daily. 15 g 5  . Blood Glucose Monitoring Suppl (ACCU-CHEK AVIVA PLUS) w/Device KIT CHECK BLOOD SUGAR 5 TIMES A DAY 1 kit 0  . Cholecalciferol (VITAMIN D3) 2000 UNITS capsule Take 1 capsule  (2,000 Units total) by mouth daily. 90 capsule 0  . Continuous Blood Gluc Sensor (FREESTYLE LIBRE 14 DAY SENSOR) MISC 1 each by Does not apply route 6 (six) times daily. 2 each 12  . diclofenac sodium (VOLTAREN) 1 % GEL Apply 4 g topically 4 (four) times daily. 100 g 2  . docusate sodium (COLACE) 100 MG capsule Take 1 capsule (100 mg total) by mouth daily as needed for mild constipation. 14 capsule 0  . ELIQUIS 5 MG TABS tablet TAKE 1 TABLET(5 MG) BY MOUTH TWICE DAILY 180 tablet 3  . enalapril (VASOTEC) 20 MG tablet TAKE 2 TABLETS(40 MG) BY MOUTH DAILY 180 tablet 0  . furosemide (LASIX) 40 MG tablet TAKE 1 TABLET(40 MG) BY MOUTH TWICE DAILY 60 tablet 3  . gabapentin (NEURONTIN) 300 MG capsule TAKE 1 CAPSULE(300 MG) BY MOUTH AT BEDTIME 90 capsule 3  . glucose blood (ACCU-CHEK AVIVA PLUS) test strip USE TO CHECK BLOOD Five TIMES DAILY 450 each 3  . glucose blood (FREESTYLE PRECISION NEO TEST) test strip Check blood sugar up to 3 times a day as instructed by the reader 100 each 12  . HUMULIN R 500 UNIT/ML injection DRAW INSULIN TO THE 68 UNIT LINE ON THE U-100 SYRINGE(TO USE 340 UNITS) PER DAY AS DIRECTED 20 mL 0  . HUMULIN R U-500 KWIKPEN 500 UNIT/ML kwikpen INJECT 35 UNITS AT BREAKFAST, 60 UNITS AT LUNCH AND 70 UNITS AT DINNER 24 mL 0  . HYDROcodone-acetaminophen (NORCO) 7.5-325 MG tablet Take 1 tablet by mouth every  6 (six) hours as needed for moderate pain. 130 tablet 0  . Insulin Disposable Pump (V-GO 20) KIT USE 1 PUMP DAILY WITH INSULIN 30 kit 5  . Insulin Pen Needle 31G X 5 MM MISC Use for insulin injection 5 times a day. 150 each 5  . INSULIN SYRINGE 1CC/29G 29G X 1/2" 1 ML MISC USE DAILY AS DIRECTED 100 each 0  . Insulin Syringe-Needle U-100 25G X 1" 1 ML MISC Use 1 syringe daily to fill V-Go pump 30 each 3  . metoprolol succinate (TOPROL-XL) 25 MG 24 hr tablet TAKE 1 TABLET(25 MG) BY MOUTH DAILY 90 tablet 0  . omeprazole (PRILOSEC) 20 MG capsule TAKE 1 CAPSULE(20 MG) BY MOUTH DAILY 90  capsule 0  . promethazine (PHENERGAN) 25 MG tablet TAKE 1 TABLET(25 MG) BY MOUTH EVERY 8 HOURS AS NEEDED FOR NAUSEA OR VOMITING 30 tablet 0  . silver sulfADIAZINE (SILVADENE) 1 % cream Apply 1 application topically daily. 50 g 1  . SIMBRINZA 1-0.2 % SUSP Place 1 drop into both eyes 2 (two) times daily. Take as instructed by Dr. Katy Fitch.    . TOUJEO SOLOSTAR 300 UNIT/ML SOPN INJECT 60 UNITS INTO THE SKIN EVERY MORNING AND 90 UNITS INTO THE SKIN AT NIGHT 9 pen 0  . VICTOZA 18 MG/3ML SOPN ADMINISTER 1.8 MG UNDER THE SKIN DAILY 9 mL 3   No current facility-administered medications on file prior to visit.     Allergies  Allergen Reactions  . Vancomycin     REACTION: ARF    Objective: There were no vitals filed for this visit.  General: No acute distress, AAOx3  Left foot: Plantar forefoot, wound limited to skin, wound bed measuring 2x1.5x0.2 post debridement (larger than previous) with mild periwound keratosis, no swelling to left foot, no erythema, no warmth, no active drainage however on inner guaze there is drainage bloody drainage with no acute signs of infection noted, Capillary fill time <3 seconds in all digits remaining, amputation status of 2nd and 3rd toes. No pain with calf compression.   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[place])    -  Primary   PAD (peripheral artery disease) ([place])       Diabetes mellitus due to underlying condition with diabetic polyneuropathy, unspecified whether long term insulin use ([place])       Foot amputation status, left ([facility])       PVD (peripheral vascular disease) ([place])           -Patient seen and evaluated -Discussed treatment options; since wound has stalled and biopsy results which show chronic inflamed tissue with hyperplaysia - Excisionally dedbrided ulceration at Left plantar forefoot to healthy bleeding borders removing nonviable tissue using a  sterile chisel blade. Wound measures post debridement as above. Hemostasis was achieved with manuel pressure. Patient tolerated procedure well without any discomfort or anesthesia necessary for this wound debridement. -Applied surgicel silver mesh for hyperplaysia and dry dressings to Left foot ulceration  - Advised patient to go to the ER or return to office if the wound worsens or if constitutional symptoms are present. -Advised patient to make sure to keep dressings clean, dry, and intact allowing nursing to change using surgicel and then when out use silver alginate and offloading padding; Resume on Friday like previous.  -Advised patient to continue with forefoot offloading post-op shoe on left  foot with use of cane -Advised patient to limit activity to necessity  -Advised patient to rest and elevate as necessary -Patient to return for wound check and discussion for possible referral to wound care/HBO center if no better at next visit. In the meantime, patient to call office if any issues or problems arise.   Landis Martins, DPM

## 2017-10-06 ENCOUNTER — Telehealth: Payer: Self-pay | Admitting: *Deleted

## 2017-10-06 NOTE — Telephone Encounter (Signed)
Faxed Dr. Leeanne Rio 10/05/2017 10:18pm orders to [facility].

## 2017-10-06 NOTE — Telephone Encounter (Signed)
Chrys Racer - [facility] they did receive the orders for pt and will send to the [place] branch [facility] and let them know the fax was malfunctioning.

## 2017-10-06 NOTE — Telephone Encounter (Signed)
-----   Message from Landis Martins, [place] sent at 10/05/2017 10:18 PM EDT ----- Regarding: Wound care orders  Apply Surgicel silver dressing to wound covered with dry dressing 3x per week. The wound bed may turn black from the silver in the dressing. I gave patient some surgicel silver mesh dressing when he was in office. If run out may use silver alginate dressing until next office visit -Dr. Chauncey Cruel

## 2017-10-06 NOTE — Telephone Encounter (Addendum)
Left message [facility] that the fax [phone] was not working and I had faxed to their main fax, to please let me know the orders would be taken care of. Faxed Dr. Leeanne Rio 10/05/2017 orders to [facility].

## 2017-10-07 DIAGNOSIS — E113311 Type 2 diabetes mellitus with moderate nonproliferative diabetic retinopathy with macular edema, right eye: Secondary | ICD-10-CM | POA: Diagnosis not present

## 2017-10-08 DIAGNOSIS — E1142 Type 2 diabetes mellitus with diabetic polyneuropathy: Secondary | ICD-10-CM | POA: Diagnosis not present

## 2017-10-08 DIAGNOSIS — E1122 Type 2 diabetes mellitus with diabetic chronic kidney disease: Secondary | ICD-10-CM | POA: Diagnosis not present

## 2017-10-08 DIAGNOSIS — I129 Hypertensive chronic kidney disease with stage 1 through stage 4 chronic kidney disease, or unspecified chronic kidney disease: Secondary | ICD-10-CM | POA: Diagnosis not present

## 2017-10-08 DIAGNOSIS — Z89422 Acquired absence of other left toe(s): Secondary | ICD-10-CM | POA: Diagnosis not present

## 2017-10-08 DIAGNOSIS — Z4800 Encounter for change or removal of nonsurgical wound dressing: Secondary | ICD-10-CM | POA: Diagnosis not present

## 2017-10-08 DIAGNOSIS — E11311 Type 2 diabetes mellitus with unspecified diabetic retinopathy with macular edema: Secondary | ICD-10-CM | POA: Diagnosis not present

## 2017-10-08 DIAGNOSIS — Z7901 Long term (current) use of anticoagulants: Secondary | ICD-10-CM | POA: Diagnosis not present

## 2017-10-08 DIAGNOSIS — L97522 Non-pressure chronic ulcer of other part of left foot with fat layer exposed: Secondary | ICD-10-CM | POA: Diagnosis not present

## 2017-10-08 DIAGNOSIS — N183 Chronic kidney disease, stage 3 (moderate): Secondary | ICD-10-CM | POA: Diagnosis not present

## 2017-10-08 DIAGNOSIS — E11621 Type 2 diabetes mellitus with foot ulcer: Secondary | ICD-10-CM | POA: Diagnosis not present

## 2017-10-08 DIAGNOSIS — Z794 Long term (current) use of insulin: Secondary | ICD-10-CM | POA: Diagnosis not present

## 2017-10-11 DIAGNOSIS — E11621 Type 2 diabetes mellitus with foot ulcer: Secondary | ICD-10-CM | POA: Diagnosis not present

## 2017-10-11 DIAGNOSIS — L97522 Non-pressure chronic ulcer of other part of left foot with fat layer exposed: Secondary | ICD-10-CM | POA: Diagnosis not present

## 2017-10-11 DIAGNOSIS — Z89422 Acquired absence of other left toe(s): Secondary | ICD-10-CM | POA: Diagnosis not present

## 2017-10-11 DIAGNOSIS — I129 Hypertensive chronic kidney disease with stage 1 through stage 4 chronic kidney disease, or unspecified chronic kidney disease: Secondary | ICD-10-CM | POA: Diagnosis not present

## 2017-10-11 DIAGNOSIS — Z794 Long term (current) use of insulin: Secondary | ICD-10-CM | POA: Diagnosis not present

## 2017-10-11 DIAGNOSIS — Z7901 Long term (current) use of anticoagulants: Secondary | ICD-10-CM | POA: Diagnosis not present

## 2017-10-11 DIAGNOSIS — E1122 Type 2 diabetes mellitus with diabetic chronic kidney disease: Secondary | ICD-10-CM | POA: Diagnosis not present

## 2017-10-11 DIAGNOSIS — E1142 Type 2 diabetes mellitus with diabetic polyneuropathy: Secondary | ICD-10-CM | POA: Diagnosis not present

## 2017-10-11 DIAGNOSIS — N183 Chronic kidney disease, stage 3 (moderate): Secondary | ICD-10-CM | POA: Diagnosis not present

## 2017-10-11 DIAGNOSIS — E11311 Type 2 diabetes mellitus with unspecified diabetic retinopathy with macular edema: Secondary | ICD-10-CM | POA: Diagnosis not present

## 2017-10-11 DIAGNOSIS — Z4800 Encounter for change or removal of nonsurgical wound dressing: Secondary | ICD-10-CM | POA: Diagnosis not present

## 2017-10-13 DIAGNOSIS — I129 Hypertensive chronic kidney disease with stage 1 through stage 4 chronic kidney disease, or unspecified chronic kidney disease: Secondary | ICD-10-CM | POA: Diagnosis not present

## 2017-10-13 DIAGNOSIS — Z794 Long term (current) use of insulin: Secondary | ICD-10-CM | POA: Diagnosis not present

## 2017-10-13 DIAGNOSIS — Z4800 Encounter for change or removal of nonsurgical wound dressing: Secondary | ICD-10-CM | POA: Diagnosis not present

## 2017-10-13 DIAGNOSIS — N183 Chronic kidney disease, stage 3 (moderate): Secondary | ICD-10-CM | POA: Diagnosis not present

## 2017-10-13 DIAGNOSIS — E1122 Type 2 diabetes mellitus with diabetic chronic kidney disease: Secondary | ICD-10-CM | POA: Diagnosis not present

## 2017-10-13 DIAGNOSIS — Z7901 Long term (current) use of anticoagulants: Secondary | ICD-10-CM | POA: Diagnosis not present

## 2017-10-13 DIAGNOSIS — E11621 Type 2 diabetes mellitus with foot ulcer: Secondary | ICD-10-CM | POA: Diagnosis not present

## 2017-10-13 DIAGNOSIS — E11311 Type 2 diabetes mellitus with unspecified diabetic retinopathy with macular edema: Secondary | ICD-10-CM | POA: Diagnosis not present

## 2017-10-13 DIAGNOSIS — E1142 Type 2 diabetes mellitus with diabetic polyneuropathy: Secondary | ICD-10-CM | POA: Diagnosis not present

## 2017-10-13 DIAGNOSIS — Z89422 Acquired absence of other left toe(s): Secondary | ICD-10-CM | POA: Diagnosis not present

## 2017-10-13 DIAGNOSIS — L97522 Non-pressure chronic ulcer of other part of left foot with fat layer exposed: Secondary | ICD-10-CM | POA: Diagnosis not present

## 2017-10-14 ENCOUNTER — Other Ambulatory Visit: Payer: Self-pay | Admitting: Endocrinology

## 2017-10-14 ENCOUNTER — Other Ambulatory Visit: Payer: Self-pay | Admitting: Internal Medicine

## 2017-10-15 DIAGNOSIS — Z794 Long term (current) use of insulin: Secondary | ICD-10-CM | POA: Diagnosis not present

## 2017-10-15 DIAGNOSIS — E11311 Type 2 diabetes mellitus with unspecified diabetic retinopathy with macular edema: Secondary | ICD-10-CM | POA: Diagnosis not present

## 2017-10-15 DIAGNOSIS — Z7901 Long term (current) use of anticoagulants: Secondary | ICD-10-CM | POA: Diagnosis not present

## 2017-10-15 DIAGNOSIS — Z4800 Encounter for change or removal of nonsurgical wound dressing: Secondary | ICD-10-CM | POA: Diagnosis not present

## 2017-10-15 DIAGNOSIS — L97522 Non-pressure chronic ulcer of other part of left foot with fat layer exposed: Secondary | ICD-10-CM | POA: Diagnosis not present

## 2017-10-15 DIAGNOSIS — N183 Chronic kidney disease, stage 3 (moderate): Secondary | ICD-10-CM | POA: Diagnosis not present

## 2017-10-15 DIAGNOSIS — Z89422 Acquired absence of other left toe(s): Secondary | ICD-10-CM | POA: Diagnosis not present

## 2017-10-15 DIAGNOSIS — E1142 Type 2 diabetes mellitus with diabetic polyneuropathy: Secondary | ICD-10-CM | POA: Diagnosis not present

## 2017-10-15 DIAGNOSIS — E11621 Type 2 diabetes mellitus with foot ulcer: Secondary | ICD-10-CM | POA: Diagnosis not present

## 2017-10-15 DIAGNOSIS — I129 Hypertensive chronic kidney disease with stage 1 through stage 4 chronic kidney disease, or unspecified chronic kidney disease: Secondary | ICD-10-CM | POA: Diagnosis not present

## 2017-10-15 DIAGNOSIS — E1122 Type 2 diabetes mellitus with diabetic chronic kidney disease: Secondary | ICD-10-CM | POA: Diagnosis not present

## 2017-10-18 DIAGNOSIS — I129 Hypertensive chronic kidney disease with stage 1 through stage 4 chronic kidney disease, or unspecified chronic kidney disease: Secondary | ICD-10-CM | POA: Diagnosis not present

## 2017-10-18 DIAGNOSIS — Z794 Long term (current) use of insulin: Secondary | ICD-10-CM | POA: Diagnosis not present

## 2017-10-18 DIAGNOSIS — N183 Chronic kidney disease, stage 3 (moderate): Secondary | ICD-10-CM | POA: Diagnosis not present

## 2017-10-18 DIAGNOSIS — E1122 Type 2 diabetes mellitus with diabetic chronic kidney disease: Secondary | ICD-10-CM | POA: Diagnosis not present

## 2017-10-18 DIAGNOSIS — L97522 Non-pressure chronic ulcer of other part of left foot with fat layer exposed: Secondary | ICD-10-CM | POA: Diagnosis not present

## 2017-10-18 DIAGNOSIS — E1142 Type 2 diabetes mellitus with diabetic polyneuropathy: Secondary | ICD-10-CM | POA: Diagnosis not present

## 2017-10-18 DIAGNOSIS — Z7901 Long term (current) use of anticoagulants: Secondary | ICD-10-CM | POA: Diagnosis not present

## 2017-10-18 DIAGNOSIS — Z89422 Acquired absence of other left toe(s): Secondary | ICD-10-CM | POA: Diagnosis not present

## 2017-10-18 DIAGNOSIS — E11311 Type 2 diabetes mellitus with unspecified diabetic retinopathy with macular edema: Secondary | ICD-10-CM | POA: Diagnosis not present

## 2017-10-18 DIAGNOSIS — Z4800 Encounter for change or removal of nonsurgical wound dressing: Secondary | ICD-10-CM | POA: Diagnosis not present

## 2017-10-18 DIAGNOSIS — E11621 Type 2 diabetes mellitus with foot ulcer: Secondary | ICD-10-CM | POA: Diagnosis not present

## 2017-10-20 DIAGNOSIS — Z794 Long term (current) use of insulin: Secondary | ICD-10-CM | POA: Diagnosis not present

## 2017-10-20 DIAGNOSIS — N183 Chronic kidney disease, stage 3 (moderate): Secondary | ICD-10-CM | POA: Diagnosis not present

## 2017-10-20 DIAGNOSIS — E11621 Type 2 diabetes mellitus with foot ulcer: Secondary | ICD-10-CM | POA: Diagnosis not present

## 2017-10-20 DIAGNOSIS — E1122 Type 2 diabetes mellitus with diabetic chronic kidney disease: Secondary | ICD-10-CM | POA: Diagnosis not present

## 2017-10-20 DIAGNOSIS — Z4800 Encounter for change or removal of nonsurgical wound dressing: Secondary | ICD-10-CM | POA: Diagnosis not present

## 2017-10-20 DIAGNOSIS — E11311 Type 2 diabetes mellitus with unspecified diabetic retinopathy with macular edema: Secondary | ICD-10-CM | POA: Diagnosis not present

## 2017-10-20 DIAGNOSIS — L97522 Non-pressure chronic ulcer of other part of left foot with fat layer exposed: Secondary | ICD-10-CM | POA: Diagnosis not present

## 2017-10-20 DIAGNOSIS — E1142 Type 2 diabetes mellitus with diabetic polyneuropathy: Secondary | ICD-10-CM | POA: Diagnosis not present

## 2017-10-20 DIAGNOSIS — I129 Hypertensive chronic kidney disease with stage 1 through stage 4 chronic kidney disease, or unspecified chronic kidney disease: Secondary | ICD-10-CM | POA: Diagnosis not present

## 2017-10-20 DIAGNOSIS — Z7901 Long term (current) use of anticoagulants: Secondary | ICD-10-CM | POA: Diagnosis not present

## 2017-10-20 DIAGNOSIS — Z89422 Acquired absence of other left toe(s): Secondary | ICD-10-CM | POA: Diagnosis not present

## 2017-10-21 ENCOUNTER — Other Ambulatory Visit: Payer: Self-pay | Admitting: Endocrinology

## 2017-10-21 ENCOUNTER — Other Ambulatory Visit: Payer: Self-pay | Admitting: Student in an Organized Health Care Education/Training Program

## 2017-10-21 DIAGNOSIS — E1142 Type 2 diabetes mellitus with diabetic polyneuropathy: Secondary | ICD-10-CM

## 2017-10-22 DIAGNOSIS — E11311 Type 2 diabetes mellitus with unspecified diabetic retinopathy with macular edema: Secondary | ICD-10-CM | POA: Diagnosis not present

## 2017-10-22 DIAGNOSIS — I129 Hypertensive chronic kidney disease with stage 1 through stage 4 chronic kidney disease, or unspecified chronic kidney disease: Secondary | ICD-10-CM | POA: Diagnosis not present

## 2017-10-22 DIAGNOSIS — Z4800 Encounter for change or removal of nonsurgical wound dressing: Secondary | ICD-10-CM | POA: Diagnosis not present

## 2017-10-22 DIAGNOSIS — E11621 Type 2 diabetes mellitus with foot ulcer: Secondary | ICD-10-CM | POA: Diagnosis not present

## 2017-10-22 DIAGNOSIS — E1122 Type 2 diabetes mellitus with diabetic chronic kidney disease: Secondary | ICD-10-CM | POA: Diagnosis not present

## 2017-10-22 DIAGNOSIS — N183 Chronic kidney disease, stage 3 (moderate): Secondary | ICD-10-CM | POA: Diagnosis not present

## 2017-10-22 DIAGNOSIS — Z794 Long term (current) use of insulin: Secondary | ICD-10-CM | POA: Diagnosis not present

## 2017-10-22 DIAGNOSIS — E113512 Type 2 diabetes mellitus with proliferative diabetic retinopathy with macular edema, left eye: Secondary | ICD-10-CM | POA: Diagnosis not present

## 2017-10-22 DIAGNOSIS — Z7901 Long term (current) use of anticoagulants: Secondary | ICD-10-CM | POA: Diagnosis not present

## 2017-10-22 DIAGNOSIS — Z89422 Acquired absence of other left toe(s): Secondary | ICD-10-CM | POA: Diagnosis not present

## 2017-10-22 DIAGNOSIS — E1142 Type 2 diabetes mellitus with diabetic polyneuropathy: Secondary | ICD-10-CM | POA: Diagnosis not present

## 2017-10-22 DIAGNOSIS — L97522 Non-pressure chronic ulcer of other part of left foot with fat layer exposed: Secondary | ICD-10-CM | POA: Diagnosis not present

## 2017-10-25 ENCOUNTER — Telehealth: Payer: Self-pay | Admitting: *Deleted

## 2017-10-25 DIAGNOSIS — E11311 Type 2 diabetes mellitus with unspecified diabetic retinopathy with macular edema: Secondary | ICD-10-CM | POA: Diagnosis not present

## 2017-10-25 DIAGNOSIS — Z4800 Encounter for change or removal of nonsurgical wound dressing: Secondary | ICD-10-CM | POA: Diagnosis not present

## 2017-10-25 DIAGNOSIS — Z89422 Acquired absence of other left toe(s): Secondary | ICD-10-CM | POA: Diagnosis not present

## 2017-10-25 DIAGNOSIS — E1142 Type 2 diabetes mellitus with diabetic polyneuropathy: Secondary | ICD-10-CM | POA: Diagnosis not present

## 2017-10-25 DIAGNOSIS — Z794 Long term (current) use of insulin: Secondary | ICD-10-CM | POA: Diagnosis not present

## 2017-10-25 DIAGNOSIS — E1122 Type 2 diabetes mellitus with diabetic chronic kidney disease: Secondary | ICD-10-CM | POA: Diagnosis not present

## 2017-10-25 DIAGNOSIS — E11621 Type 2 diabetes mellitus with foot ulcer: Secondary | ICD-10-CM | POA: Diagnosis not present

## 2017-10-25 DIAGNOSIS — I129 Hypertensive chronic kidney disease with stage 1 through stage 4 chronic kidney disease, or unspecified chronic kidney disease: Secondary | ICD-10-CM | POA: Diagnosis not present

## 2017-10-25 DIAGNOSIS — Z7901 Long term (current) use of anticoagulants: Secondary | ICD-10-CM | POA: Diagnosis not present

## 2017-10-25 DIAGNOSIS — N183 Chronic kidney disease, stage 3 (moderate): Secondary | ICD-10-CM | POA: Diagnosis not present

## 2017-10-25 DIAGNOSIS — L97522 Non-pressure chronic ulcer of other part of left foot with fat layer exposed: Secondary | ICD-10-CM | POA: Diagnosis not present

## 2017-10-25 NOTE — Telephone Encounter (Signed)
I spoke with pt and instructed him to cover the toe with antibiotic oinment and dressing and Dr. Cannon Kettle would see him and discuss at his appt. Pt asked how long would it take for her to take it off. I told him about 15 minutes more than his regular appt.

## 2017-10-25 NOTE — Telephone Encounter (Signed)
Pt called states he thinks he has knocked the toenail off, how long would it take for Dr. Cannon Kettle to take it off.

## 2017-10-26 ENCOUNTER — Ambulatory Visit (INDEPENDENT_AMBULATORY_CARE_PROVIDER_SITE_OTHER): Payer: Medicare Other | Admitting: Sports Medicine

## 2017-10-26 ENCOUNTER — Telehealth: Payer: Self-pay | Admitting: *Deleted

## 2017-10-26 ENCOUNTER — Encounter: Payer: Self-pay | Admitting: Sports Medicine

## 2017-10-26 DIAGNOSIS — Z89432 Acquired absence of left foot: Secondary | ICD-10-CM

## 2017-10-26 DIAGNOSIS — L97522 Non-pressure chronic ulcer of other part of left foot with fat layer exposed: Secondary | ICD-10-CM | POA: Diagnosis not present

## 2017-10-26 DIAGNOSIS — S91209A Unspecified open wound of unspecified toe(s) with damage to nail, initial encounter: Secondary | ICD-10-CM

## 2017-10-26 DIAGNOSIS — I739 Peripheral vascular disease, unspecified: Secondary | ICD-10-CM

## 2017-10-26 DIAGNOSIS — IMO0002 Reserved for concepts with insufficient information to code with codable children: Secondary | ICD-10-CM

## 2017-10-26 DIAGNOSIS — E0842 Diabetes mellitus due to underlying condition with diabetic polyneuropathy: Secondary | ICD-10-CM

## 2017-10-26 NOTE — Progress Notes (Signed)
Subjective: [name] is a 67 y.o. male patient seen today in office for left foot wound care and for discussion of biopsy results; Denies pain at surgical/wound bed site, but has pain 8 out of 10 after bumping toe, nurse dressed it, denies calf pain, denies headache, chest pain, shortness of breath, nausea, vomiting, fever, or chills. No other issues noted.   FBS on insulin pump last blood sugar, 106, last a1c was 8.3  On Eliquis   Patient has home nursing assisting with dressing changes.  Patient Active Problem List   Diagnosis Date Noted  . Nausea 08/31/2017  . Chronic pain syndrome 09/18/2015  . Preventative health care 03/12/2015  . Foot ulcer, left ([place]) 12/13/2014  . Paroxysmal atrial fibrillation ([place]) 12/13/2014  . Vitamin D deficiency 11/17/2013  . Anemia 02/10/2012  . Hypertension 11/18/2011  . Obstructive sleep apnea 12/12/2009  . GERD 06/27/2008  . Diabetes type 2, uncontrolled ([place]) 12/28/2007  . DIABETIC MACULAR EDEMA 10/07/2007  . HEARING LOSS, SENSORINEURAL, BILATERAL 01/03/2007  . Diabetic peripheral neuropathy associated with type 2 diabetes mellitus ([place]) 12/16/2006  . Chronic kidney disease, stage III (moderate) ([place]) 12/01/2006  . Morbid obesity ([place]) 08/23/2006  . STATUS, OTHER TOE(S) AMPUTATION 05/21/2006    Current Outpatient Medications on File Prior to Visit  Medication Sig Dispense Refill  . ACCU-CHEK FASTCLIX LANCETS MISC Check blood sugar 5 times a day 408 each 3  . albuterol (PROAIR HFA) 108 (90 Base) MCG/ACT inhaler Inhale 2 puffs into the lungs every 6 (six) hours as needed for wheezing or shortness of breath. 24.7 g 3  . atorvastatin (LIPITOR) 20 MG tablet TAKE 1 TABLET(20 MG) BY MOUTH DAILY 90 tablet 1  . becaplermin (REGRANEX) 0.01 % gel Apply 1 application topically daily. 15 g 5  . Blood Glucose Monitoring Suppl (ACCU-CHEK AVIVA PLUS) w/Device KIT CHECK BLOOD SUGAR 5 TIMES A DAY 1 kit 0  . Cholecalciferol (VITAMIN D3) 2000  UNITS capsule Take 1 capsule (2,000 Units total) by mouth daily. 90 capsule 0  . Continuous Blood Gluc Sensor (FREESTYLE LIBRE 14 DAY SENSOR) MISC 1 each by Does not apply route 6 (six) times daily. 2 each 12  . diclofenac sodium (VOLTAREN) 1 % GEL Apply 4 g topically 4 (four) times daily. 100 g 2  . docusate sodium (COLACE) 100 MG capsule Take 1 capsule (100 mg total) by mouth daily as needed for mild constipation. 14 capsule 0  . ELIQUIS 5 MG TABS tablet TAKE 1 TABLET(5 MG) BY MOUTH TWICE DAILY 180 tablet 3  . enalapril (VASOTEC) 20 MG tablet TAKE 2 TABLETS(40 MG) BY MOUTH DAILY 180 tablet 0  . furosemide (LASIX) 40 MG tablet TAKE 1 TABLET(40 MG) BY MOUTH TWICE DAILY 60 tablet 3  . gabapentin (NEURONTIN) 300 MG capsule TAKE 1 CAPSULE(300 MG) BY MOUTH AT BEDTIME 90 capsule 0  . glucose blood (ACCU-CHEK AVIVA PLUS) test strip USE TO CHECK BLOOD Five TIMES DAILY 450 each 3  . glucose blood (FREESTYLE PRECISION NEO TEST) test strip Check blood sugar up to 3 times a day as instructed by the reader 100 each 12  . HUMULIN R 500 UNIT/ML injection DRAW INSULIN TO THE 68 UNIT LINE ON THE U-100 SYRINGE(TO USE 340 UNITS) PER DAY AS DIRECTED 20 mL 0  . HUMULIN R 500 UNIT/ML injection DRAW INSULIN TO THE 68 UNIT LINE ON THE U-100 SYRINGE(TO USE 340 UNITS) PER DAY AS DIRECTED 20 mL 0  . HUMULIN R U-500  KWIKPEN 500 UNIT/ML kwikpen INJECT 35 UNITS AT BREAKFAST, 60 UNITS AT LUNCH AND 70 UNITS AT DINNER 24 mL 0  . Insulin Disposable Pump (V-GO 20) KIT USE 1 PUMP DAILY WITH INSULIN 30 kit 5  . Insulin Pen Needle 31G X 5 MM MISC Use for insulin injection 5 times a day. 150 each 5  . INSULIN SYRINGE 1CC/29G 29G X 1/2" 1 ML MISC USE DAILY AS DIRECTED 100 each 0  . Insulin Syringe-Needle U-100 25G X 1" 1 ML MISC Use 1 syringe daily to fill V-Go pump 30 each 3  . metoprolol succinate (TOPROL-XL) 25 MG 24 hr tablet TAKE 1 TABLET(25 MG) BY MOUTH DAILY 90 tablet 0  . omeprazole (PRILOSEC) 20 MG capsule TAKE 1 CAPSULE(20 MG)  BY MOUTH DAILY 90 capsule 0  . promethazine (PHENERGAN) 25 MG tablet TAKE 1 TABLET(25 MG) BY MOUTH EVERY 8 HOURS AS NEEDED FOR NAUSEA OR VOMITING 30 tablet 0  . silver sulfADIAZINE (SILVADENE) 1 % cream Apply 1 application topically daily. 50 g 1  . SIMBRINZA 1-0.2 % SUSP Place 1 drop into both eyes 2 (two) times daily. Take as instructed by Dr. Katy Fitch.    . TOUJEO SOLOSTAR 300 UNIT/ML SOPN INJECT 60 UNITS INTO THE SKIN EVERY MORNING AND 90 UNITS INTO THE SKIN AT NIGHT 9 pen 0  . VICTOZA 18 MG/3ML SOPN ADMINISTER 1.8 MG UNDER THE SKIN DAILY 9 mL 3  . VICTOZA 18 MG/3ML SOPN INJECT 1.8 MG ONCE DAILY AT THE SAME TIME 9 mL 0  . HYDROcodone-acetaminophen (NORCO) 7.5-325 MG tablet Take 1 tablet by mouth every 6 (six) hours as needed for moderate pain. 130 tablet 0   No current facility-administered medications on file prior to visit.     Allergies  Allergen Reactions  . Vancomycin     REACTION: ARF    Objective: There were no vitals filed for this visit.  General: No acute distress, AAOx3  Left foot: Left hallux nail distally lifting with no acute signs of infection. Plantar forefoot, wound limited to skin, wound bed measuring 2x1.4x0.2 post debridement (same as  previous) with mild periwound keratosis, no swelling to left foot, no erythema, no warmth, no active drainage however on inner guaze there is drainage bloody drainage with no acute signs of infection noted, Capillary fill time <3 seconds in all digits remaining, amputation status of 2nd and 3rd toes. No pain with calf compression.   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[place])    -  Primary   Traumatic avulsion of nail plate of toe, initial encounter       PAD (peripheral artery disease) ([place])       Diabetes mellitus due to underlying condition with diabetic polyneuropathy, unspecified whether long term insulin use ([place])       Foot  amputation status, left ([place])       PVD (peripheral vascular disease) ([place])           -Patient seen and evaluated -Discussed treatment options -Left hallux nail debrided with sterile nail nipper and applied antibiotic cream  - Excisionally dedbrided ulceration at Left plantar forefoot to healthy bleeding borders removing nonviable tissue using a sterile chisel blade. Wound measures post debridement as above. Hemostasis was achieved with manuel pressure. Patient tolerated procedure well without any discomfort or anesthesia necessary for this wound debridement. -Applied surgicel silver  mesh for hyperplaysia and dry dressings to Left foot ulceration  - Advised patient to go to the ER or return to office if the wound worsens or if constitutional symptoms are present. -Advised patient to make sure to keep dressings clean, dry, and intact allowing nursing to change using surgicel to left foot as supplied and then when out use silver alginate and offloading padding; Resume on Friday like previous and antibiotic cream to left great toenail. -Advised patient to continue with forefoot offloading post-op shoe on left foot with use of cane -Advised patient to limit activity to necessity  -Advised patient to rest and elevate as necessary -Refer patient to wound care center/HBO center since not healing. In the meantime, patient to call office if any issues or problems arise.   Landis Martins, DPM

## 2017-10-26 NOTE — Telephone Encounter (Signed)
Faxed Dr. Leeanne Rio 10/26/2017 1:38pm orders to [facility]

## 2017-10-26 NOTE — Telephone Encounter (Signed)
-----   Message from [place], [place] sent at 10/26/2017  1:38 PM EDT ----- Regarding: Wound care orders and Refer to Wound care center  Refer patient to wound care center for chronic left foot ulcer  nursing to change using surgicel to left plantar foot as supplied and then when out use silver alginate and offloading padding; Resume on Friday like previous and antibiotic cream to left great toenail.

## 2017-10-28 DIAGNOSIS — M545 Low back pain: Secondary | ICD-10-CM | POA: Diagnosis not present

## 2017-10-28 DIAGNOSIS — G894 Chronic pain syndrome: Secondary | ICD-10-CM | POA: Diagnosis not present

## 2017-10-28 DIAGNOSIS — M79672 Pain in left foot: Secondary | ICD-10-CM | POA: Diagnosis not present

## 2017-10-28 DIAGNOSIS — Z79891 Long term (current) use of opiate analgesic: Secondary | ICD-10-CM | POA: Diagnosis not present

## 2017-10-29 DIAGNOSIS — Z89422 Acquired absence of other left toe(s): Secondary | ICD-10-CM | POA: Diagnosis not present

## 2017-10-29 DIAGNOSIS — E11621 Type 2 diabetes mellitus with foot ulcer: Secondary | ICD-10-CM | POA: Diagnosis not present

## 2017-10-29 DIAGNOSIS — L97522 Non-pressure chronic ulcer of other part of left foot with fat layer exposed: Secondary | ICD-10-CM | POA: Diagnosis not present

## 2017-10-29 DIAGNOSIS — E11311 Type 2 diabetes mellitus with unspecified diabetic retinopathy with macular edema: Secondary | ICD-10-CM | POA: Diagnosis not present

## 2017-10-29 DIAGNOSIS — I129 Hypertensive chronic kidney disease with stage 1 through stage 4 chronic kidney disease, or unspecified chronic kidney disease: Secondary | ICD-10-CM | POA: Diagnosis not present

## 2017-10-29 DIAGNOSIS — E1122 Type 2 diabetes mellitus with diabetic chronic kidney disease: Secondary | ICD-10-CM | POA: Diagnosis not present

## 2017-10-29 DIAGNOSIS — Z794 Long term (current) use of insulin: Secondary | ICD-10-CM | POA: Diagnosis not present

## 2017-10-29 DIAGNOSIS — E1142 Type 2 diabetes mellitus with diabetic polyneuropathy: Secondary | ICD-10-CM | POA: Diagnosis not present

## 2017-10-29 DIAGNOSIS — Z4800 Encounter for change or removal of nonsurgical wound dressing: Secondary | ICD-10-CM | POA: Diagnosis not present

## 2017-10-29 DIAGNOSIS — N183 Chronic kidney disease, stage 3 (moderate): Secondary | ICD-10-CM | POA: Diagnosis not present

## 2017-10-29 DIAGNOSIS — Z7901 Long term (current) use of anticoagulants: Secondary | ICD-10-CM | POA: Diagnosis not present

## 2017-11-01 DIAGNOSIS — N183 Chronic kidney disease, stage 3 (moderate): Secondary | ICD-10-CM | POA: Diagnosis not present

## 2017-11-01 DIAGNOSIS — I129 Hypertensive chronic kidney disease with stage 1 through stage 4 chronic kidney disease, or unspecified chronic kidney disease: Secondary | ICD-10-CM | POA: Diagnosis not present

## 2017-11-01 DIAGNOSIS — E11311 Type 2 diabetes mellitus with unspecified diabetic retinopathy with macular edema: Secondary | ICD-10-CM | POA: Diagnosis not present

## 2017-11-01 DIAGNOSIS — E1142 Type 2 diabetes mellitus with diabetic polyneuropathy: Secondary | ICD-10-CM | POA: Diagnosis not present

## 2017-11-01 DIAGNOSIS — Z794 Long term (current) use of insulin: Secondary | ICD-10-CM | POA: Diagnosis not present

## 2017-11-01 DIAGNOSIS — E11621 Type 2 diabetes mellitus with foot ulcer: Secondary | ICD-10-CM | POA: Diagnosis not present

## 2017-11-01 DIAGNOSIS — Z7901 Long term (current) use of anticoagulants: Secondary | ICD-10-CM | POA: Diagnosis not present

## 2017-11-01 DIAGNOSIS — Z4800 Encounter for change or removal of nonsurgical wound dressing: Secondary | ICD-10-CM | POA: Diagnosis not present

## 2017-11-01 DIAGNOSIS — E1122 Type 2 diabetes mellitus with diabetic chronic kidney disease: Secondary | ICD-10-CM | POA: Diagnosis not present

## 2017-11-01 DIAGNOSIS — L97522 Non-pressure chronic ulcer of other part of left foot with fat layer exposed: Secondary | ICD-10-CM | POA: Diagnosis not present

## 2017-11-01 DIAGNOSIS — Z89422 Acquired absence of other left toe(s): Secondary | ICD-10-CM | POA: Diagnosis not present

## 2017-11-03 DIAGNOSIS — E1122 Type 2 diabetes mellitus with diabetic chronic kidney disease: Secondary | ICD-10-CM | POA: Diagnosis not present

## 2017-11-03 DIAGNOSIS — N183 Chronic kidney disease, stage 3 (moderate): Secondary | ICD-10-CM | POA: Diagnosis not present

## 2017-11-03 DIAGNOSIS — G894 Chronic pain syndrome: Secondary | ICD-10-CM | POA: Diagnosis not present

## 2017-11-03 DIAGNOSIS — E1142 Type 2 diabetes mellitus with diabetic polyneuropathy: Secondary | ICD-10-CM | POA: Diagnosis not present

## 2017-11-03 DIAGNOSIS — D631 Anemia in chronic kidney disease: Secondary | ICD-10-CM | POA: Diagnosis not present

## 2017-11-03 DIAGNOSIS — Z7901 Long term (current) use of anticoagulants: Secondary | ICD-10-CM | POA: Diagnosis not present

## 2017-11-03 DIAGNOSIS — E11621 Type 2 diabetes mellitus with foot ulcer: Secondary | ICD-10-CM | POA: Diagnosis not present

## 2017-11-03 DIAGNOSIS — L97522 Non-pressure chronic ulcer of other part of left foot with fat layer exposed: Secondary | ICD-10-CM | POA: Diagnosis not present

## 2017-11-03 DIAGNOSIS — I480 Paroxysmal atrial fibrillation: Secondary | ICD-10-CM | POA: Diagnosis not present

## 2017-11-03 DIAGNOSIS — E11311 Type 2 diabetes mellitus with unspecified diabetic retinopathy with macular edema: Secondary | ICD-10-CM | POA: Diagnosis not present

## 2017-11-03 DIAGNOSIS — I129 Hypertensive chronic kidney disease with stage 1 through stage 4 chronic kidney disease, or unspecified chronic kidney disease: Secondary | ICD-10-CM | POA: Diagnosis not present

## 2017-11-04 ENCOUNTER — Telehealth: Payer: Self-pay | Admitting: Sports Medicine

## 2017-11-04 DIAGNOSIS — L97521 Non-pressure chronic ulcer of other part of left foot limited to breakdown of skin: Secondary | ICD-10-CM | POA: Diagnosis not present

## 2017-11-04 DIAGNOSIS — E119 Type 2 diabetes mellitus without complications: Secondary | ICD-10-CM | POA: Diagnosis not present

## 2017-11-04 DIAGNOSIS — E11621 Type 2 diabetes mellitus with foot ulcer: Secondary | ICD-10-CM | POA: Diagnosis not present

## 2017-11-04 DIAGNOSIS — Z794 Long term (current) use of insulin: Secondary | ICD-10-CM | POA: Diagnosis not present

## 2017-11-04 NOTE — Telephone Encounter (Signed)
This is [name], RN with [facility]. I'm calling to let Dr. Cannon Kettle know that I re-certified [name] to our home health services. I will continue to follow the orders that she sent over last I guess until he goes to the wound care center. I will keep him at 3 times a week for the next 9 weeks. If someone could please give me a call back and let me know if the orders are okay. If I do not answer, please leave me a message as I have secure voicemail. My number is [phone]. Thank you.

## 2017-11-04 NOTE — Telephone Encounter (Signed)
Thank you :)

## 2017-11-04 NOTE — Telephone Encounter (Signed)
I gave verbal orders to [name], RN - [facility].

## 2017-11-05 ENCOUNTER — Other Ambulatory Visit (INDEPENDENT_AMBULATORY_CARE_PROVIDER_SITE_OTHER): Payer: Medicare Other

## 2017-11-05 DIAGNOSIS — D631 Anemia in chronic kidney disease: Secondary | ICD-10-CM | POA: Diagnosis not present

## 2017-11-05 DIAGNOSIS — Z7901 Long term (current) use of anticoagulants: Secondary | ICD-10-CM | POA: Diagnosis not present

## 2017-11-05 DIAGNOSIS — E1142 Type 2 diabetes mellitus with diabetic polyneuropathy: Secondary | ICD-10-CM | POA: Diagnosis not present

## 2017-11-05 DIAGNOSIS — L97522 Non-pressure chronic ulcer of other part of left foot with fat layer exposed: Secondary | ICD-10-CM | POA: Diagnosis not present

## 2017-11-05 DIAGNOSIS — I480 Paroxysmal atrial fibrillation: Secondary | ICD-10-CM | POA: Diagnosis not present

## 2017-11-05 DIAGNOSIS — E1165 Type 2 diabetes mellitus with hyperglycemia: Secondary | ICD-10-CM | POA: Diagnosis not present

## 2017-11-05 DIAGNOSIS — N183 Chronic kidney disease, stage 3 (moderate): Secondary | ICD-10-CM | POA: Diagnosis not present

## 2017-11-05 DIAGNOSIS — G894 Chronic pain syndrome: Secondary | ICD-10-CM | POA: Diagnosis not present

## 2017-11-05 DIAGNOSIS — Z794 Long term (current) use of insulin: Secondary | ICD-10-CM

## 2017-11-05 DIAGNOSIS — E11311 Type 2 diabetes mellitus with unspecified diabetic retinopathy with macular edema: Secondary | ICD-10-CM | POA: Diagnosis not present

## 2017-11-05 DIAGNOSIS — E11621 Type 2 diabetes mellitus with foot ulcer: Secondary | ICD-10-CM | POA: Diagnosis not present

## 2017-11-05 DIAGNOSIS — I129 Hypertensive chronic kidney disease with stage 1 through stage 4 chronic kidney disease, or unspecified chronic kidney disease: Secondary | ICD-10-CM | POA: Diagnosis not present

## 2017-11-05 DIAGNOSIS — E1122 Type 2 diabetes mellitus with diabetic chronic kidney disease: Secondary | ICD-10-CM | POA: Diagnosis not present

## 2017-11-05 LAB — GLUCOSE, RANDOM: Glucose, Bld: 134 mg/dL — ABNORMAL HIGH (ref 70–99)

## 2017-11-05 LAB — HEMOGLOBIN A1C: HEMOGLOBIN A1C: 8.5 % — AB (ref 4.6–6.5)

## 2017-11-06 ENCOUNTER — Other Ambulatory Visit: Payer: Self-pay | Admitting: Internal Medicine

## 2017-11-06 DIAGNOSIS — IMO0002 Reserved for concepts with insufficient information to code with codable children: Secondary | ICD-10-CM

## 2017-11-06 DIAGNOSIS — E1122 Type 2 diabetes mellitus with diabetic chronic kidney disease: Secondary | ICD-10-CM

## 2017-11-06 DIAGNOSIS — E1165 Type 2 diabetes mellitus with hyperglycemia: Principal | ICD-10-CM

## 2017-11-06 DIAGNOSIS — N183 Chronic kidney disease, stage 3 (moderate): Principal | ICD-10-CM

## 2017-11-06 DIAGNOSIS — Z794 Long term (current) use of insulin: Principal | ICD-10-CM

## 2017-11-07 NOTE — Progress Notes (Signed)
Patient ID: [name], male   DOB: 01/16/1951, 67 y.o.   MRN: [id number]           Reason for Appointment: Follow-up for Type 2 Diabetes  Referring physician: Dareen Piano  History of Present Illness:          Date of diagnosis of type 2 diabetes mellitus: 2007       Background history:   He was diagnosed to have diabetes when he had an ulcer on his left third toe which led to amputation.  His glucose was apparently about 400 and he was started on insulin at that time He does not know if he has taken any oral hypoglycemic drugs in the past His A1c in 2016 has been consistently over 8% On his initial consultation was started on Victoza and subsequently started on U-500 insulin in 2/17 along with Toujeo INSULIN regimen previously was:  TOUJEO  74 in the morning and 74 in the evening HUMULIN U-500 insulin: 20/40 units in the morning, 60/70 at lunch and 70 at supper  Recent history:   Since 8/18 has been using V-go PUMP 20 unit basal, Humulin R U-500 insulin with boluses 4 units breakfast, 10 units acl  12 units Changes his pump at 8 am  Non-insulin hypoglycemic drugs the patient is taking are: Victoza 1.8 mg daily        His A1c is now 8.5, previously 8.3  Current blood sugar patterns and problems identified:  Highest blood sugars are in the evenings after supper although compared to the last with the blood sugars are relatively higher after lunch and before dinner also  Although he has been instructed in the diet he is not able to be consistent because his wife does not always prepare low-fat meals and did not attend the session with the dietitian  He is trying to take 12 units bolus for his evening meal since his last visit but not always adjusting it based on what he is eating  If he has higher fat meal and evening his blood sugars will go up much more to as much as 390 and 341 in the evenings and may be relatively higher late at night also  He says that he has been more  worried about his foot infection and may not be always watching his diabetes management  OVERNIGHT blood sugars are relatively stable but occasionally higher  He has had only one episode of low blood sugar during the night    Glucose monitoring:  done 3-4  times a day         Glucometer:  freestyle libre or Accu-Chek     Blood Glucose readings by download:   Mean values apply above for all meters except median for One Touch  PRE-MEAL Fasting Lunch Dinner Bedtime Overall  Glucose range:       Mean/median:  136  168  148  189 164   POST-MEAL PC Breakfast PC Lunch PC Dinner  Glucose range:     Mean/median:   193  206     Mean values apply above for all meters except median for One Touch  PRE-MEAL Fasting Lunch Dinner Bedtime Overall  Glucose range:       Mean/median: 120 133 173 219 157   POST-MEAL PC Breakfast PC Lunch PC Dinner  Glucose range:   143-418  Mean/median:  152 207      Self-care:  Typical meal intake: Breakfast is sometimes fruit or cereal, Sometimes eating sausage and  grits Has been trying to avoid high-fat foods or fried food but not daily  Breakfast 8 AM Lunch 12 noon Dinner at 5-6 pm He drinks cranberry or V-8 juice and diet drinks                Dietician consultation: 1/19               Exercise: not walking,  has chronic foot ulcer on the left  Weight history: Previous range 260-410  Wt Readings from Last 3 Encounters:  11/08/17 (!) 401 lb (181.9 kg)  08/31/17 (!) 410 lb 1.6 oz (186 kg)  08/11/17 (!) 407 lb 12.8 oz (185 kg)    Glycemic control:   Lab Results  Component Value Date   HGBA1C 8.5 (H) 11/05/2017   HGBA1C 8.3 (H) 07/22/2017   HGBA1C 8.0 (H) 05/20/2017   Lab Results  Component Value Date   MICROALBUR 29.8 (H) 07/22/2017   LDLCALC 55 05/20/2017   CREATININE 1.66 (H) 07/22/2017    OTHER problems review today: See review of systems     Lab on 11/05/2017  Component Date Value Ref Range Status  . Glucose, Bld  11/05/2017 134* 70 - 99 mg/dL Final  . Hgb A1c MFr Bld 11/05/2017 8.5* 4.6 - 6.5 % Final   Glycemic Control Guidelines for People with Diabetes:Non Diabetic:  <6%Goal of Therapy: <7%Additional Action Suggested:  >8%        Allergies as of 11/08/2017      Reactions   Vancomycin    REACTION: ARF      Medication List        Accurate as of 11/08/17 10:03 AM. Always use your most recent med list.          ACCU-CHEK AVIVA PLUS w/Device Kit CHECK BLOOD SUGAR 5 TIMES A DAY   ACCU-CHEK FASTCLIX LANCETS Misc Check blood sugar 5 times a day   albuterol 108 (90 Base) MCG/ACT inhaler Commonly known as:  PROAIR HFA Inhale 2 puffs into the lungs every 6 (six) hours as needed for wheezing or shortness of breath.   atorvastatin 20 MG tablet Commonly known as:  LIPITOR TAKE 1 TABLET(20 MG) BY MOUTH DAILY   becaplermin 0.01 % gel Commonly known as:  REGRANEX Apply 1 application topically daily.   diclofenac sodium 1 % Gel Commonly known as:  VOLTAREN Apply 4 g topically 4 (four) times daily.   docusate sodium 100 MG capsule Commonly known as:  COLACE Take 1 capsule (100 mg total) by mouth daily as needed for mild constipation.   ELIQUIS 5 MG Tabs tablet Generic drug:  apixaban TAKE 1 TABLET(5 MG) BY MOUTH TWICE DAILY   enalapril 20 MG tablet Commonly known as:  VASOTEC TAKE 2 TABLETS(40 MG) BY MOUTH DAILY   FREESTYLE LIBRE 14 DAY SENSOR Misc 1 each by Does not apply route 6 (six) times daily.   furosemide 40 MG tablet Commonly known as:  LASIX TAKE 1 TABLET(40 MG) BY MOUTH TWICE DAILY   gabapentin 300 MG capsule Commonly known as:  NEURONTIN TAKE 1 CAPSULE(300 MG) BY MOUTH AT BEDTIME   glucose blood test strip Commonly known as:  ACCU-CHEK AVIVA PLUS USE TO CHECK BLOOD Five TIMES DAILY   glucose blood test strip Commonly known as:  FREESTYLE PRECISION NEO TEST Check blood sugar up to 3 times a day as instructed by the reader   HUMULIN R U-500 KWIKPEN 500 UNIT/ML  kwikpen Generic drug:  insulin regular human CONCENTRATED INJECT 35 UNITS  AT BREAKFAST, 60 UNITS AT LUNCH AND 70 UNITS AT DINNER   HUMULIN R 500 UNIT/ML injection Generic drug:  insulin regular human CONCENTRATED DRAW INSULIN TO THE 68 UNIT LINE ON THE U-100 SYRINGE(TO USE 340 UNITS) PER DAY AS DIRECTED   HUMULIN R 500 UNIT/ML injection Generic drug:  insulin regular human CONCENTRATED DRAW INSULIN TO THE 68 UNIT LINE ON THE U-100 SYRINGE(TO USE 340 UNITS) PER DAY AS DIRECTED   HYDROcodone-acetaminophen 7.5-325 MG tablet Commonly known as:  NORCO Take 1 tablet by mouth every 6 (six) hours as needed for moderate pain.   Insulin Pen Needle 31G X 5 MM Misc Use for insulin injection 5 times a day.   Insulin Syringe-Needle U-100 25G X 1" 1 ML Misc Use 1 syringe daily to fill V-Go pump   INSULIN SYRINGE 1CC/29G 29G X 1/2" 1 ML Misc USE DAILY AS DIRECTED   metoprolol succinate 25 MG 24 hr tablet Commonly known as:  TOPROL-XL TAKE 1 TABLET(25 MG) BY MOUTH DAILY   omeprazole 20 MG capsule Commonly known as:  PRILOSEC TAKE 1 CAPSULE(20 MG) BY MOUTH DAILY   promethazine 25 MG tablet Commonly known as:  PHENERGAN TAKE 1 TABLET(25 MG) BY MOUTH EVERY 8 HOURS AS NEEDED FOR NAUSEA OR VOMITING   silver sulfADIAZINE 1 % cream Commonly known as:  SILVADENE Apply 1 application topically daily.   SIMBRINZA 1-0.2 % Susp Generic drug:  Brinzolamide-Brimonidine Place 1 drop into both eyes 2 (two) times daily. Take as instructed by Dr. Katy Fitch.   V-GO 20 Kit USE 1 PUMP DAILY WITH INSULIN   VICTOZA 18 MG/3ML Sopn Generic drug:  liraglutide ADMINISTER 1.8 MG UNDER THE SKIN DAILY   Vitamin D3 2000 units capsule Take 1 capsule (2,000 Units total) by mouth daily.       Allergies:  Allergies  Allergen Reactions  . Vancomycin     REACTION: ARF    Past Medical History:  Diagnosis Date  . Arthritis    "elbows & knees" (12/13/2014)  . Asthma   . CKD (chronic kidney disease), stage III  ([place])   . DIABETIC FOOT ULCER 06/20/2009  . Edema, macular, due to secondary diabetes ([place])   . Erectile dysfunction   . GERD (gastroesophageal reflux disease)   . HEARING LOSS, SENSORINEURAL, BILATERAL 01/03/2007   Seen by ENT Dr. Orpah Greek D. Redmond Baseman 01/03/07  . Hemorrhoids   . History of echocardiogram    a. 04/2008 Echo: EF 50-55%, abnl LV relaxation, mildly dil LA.  Marland Kitchen Hyperlipidemia   . Hypertension   . Morbid obesity ([place])   . Neuropathy, lower extremity   . OSA (obstructive sleep apnea)    uses CPAP nightly  . OSA on CPAP    Nocturnal polysomnogram on 01/21/2010 showed severe obstructive sleep apnea/hypopnea syndrome, AHI 74.1 per hour with non positional events, moderately loud snoring, and oxygen desaturation to a nadir of 78% on room air.  CPAP was successfully titrated to 17 CWP, AHI 1.1 per hour using a large ResMed Mirage Quattro full-face mask with heated humidifier. Bruxism was noted.   . Osteomyelitis of ankle and foot ([place])   . Retinopathy   . Type II diabetes mellitus ([facility])    w/complication NOS, type II    Past Surgical History:  Procedure Laterality Date  . AMPUTATION Left 12/15/2014   Procedure: LEFT SECOND TOE AMPUTATION ;  Surgeon: Dorna Leitz, MD;  Location: [facility];  Service: Orthopedics;  Laterality: Left;  . TOE AMPUTATION Left 01/21/2006   S/P  radical irrigation and debridement, left foot with third MTP joint amputation by Dr. Kathalene Frames. Mayer Camel.  . WOUND DEBRIDEMENT Left 06/15/2016   Procedure: DEBRIDEMENT WOUND LEFT FOOT WITH GRAFT APPLICATION;  Surgeon: Landis Martins, DPM;  Location: [facility];  Service: Podiatry;  Laterality: Left;    Family History  Problem Relation Age of Onset  . Diabetes Mother        also 2 siblings  . Heart attack Father 46  . Throat cancer Brother     Social History:  reports that he quit smoking about 7 years ago. His smoking use included cigarettes. He has a 15.00 pack-year smoking history. He has never used smokeless  tobacco. He reports that he drinks about 6.6 oz of alcohol per week. He reports that he does not use drugs.    Review of Systems     Lipid management: taking Lipitor 20 mg, followed by PCP Also has low HDL    Lab Results  Component Value Date   CHOL 116 05/20/2017   HDL 30.80 (L) 05/20/2017   LDLCALC 55 05/20/2017   TRIG 155.0 (H) 05/20/2017   CHOLHDL 4 05/20/2017            Hypertension: Has had hypertension for a few years treated with enalapril,  taking 40 mg  Also on Lasix and 25 mg metoprolol This is treated by PCP/cardiologist  He  has chronic renal dysfunction, stable  Lab Results  Component Value Date   CREATININE 1.66 (H) 07/22/2017   CREATININE 1.74 (H) 05/20/2017   CREATININE 1.80 (H) 04/20/2017     Last foot exam done by podiatrist in 07/2017, currently waiting for treatment at wound center for chronic infection   Physical Examination:  BP (!) 146/84 (BP Location: Left Arm, Patient Position: Sitting, Cuff Size: Large)   Pulse 64   Ht '6\' 4"'$  (1.93 m)   Wt (!) 401 lb (181.9 kg)   SpO2 98%   BMI 48.81 kg/m      ASSESSMENT:  Diabetes type 2, uncontrolled with morbid obesity   See history of present illness for detailed discussion of current diabetes management, blood sugar patterns and problems identified  With V-go pump and using U-500 insulin his blood sugars are overall better controlled but his A1c is still trending higher at 8.5  As  discussed above he is not having consistent postprandial control especially at suppertime Although he thinks he is doing better with his compliance with bolusing before eating his reading depend on the type of meal he is having and he is not adjusting his bolus for that Blood sugars are usually not high after breakfast or lunch when he is eating lighter meals  Overall doing better with U-500 insulin and the insulin requirement and still less than with multiple injections Has only one low blood sugar overnight  indicating proper amount of basal insulin  Again has difficulty with weight loss because of various factors and inactivity   Patient's blood sugar patterns secondary to diet and the variability was discussed, discussed timing of boluses, bolus adjustment for meal size and need for weight loss and better A1c  PLAN:     Will need to get him back to the dietitian to improve his diet along with his wife who does his cooking  He will need to take at least 2 to 4 units more for larger meals are higher fat meals  Overall needs to cut back on fried food and other high fat meals in  the evening anyway  No change in other settings   There are no Patient Instructions on file for this visit.    Elayne Snare 11/08/2017, 10:03 AM   Note: This office note was prepared with Dragon voice recognition system technology. Any transcriptional errors that result from this process are unintentional.  Counseling time on subjects discussed in assessment and plan sections is over 50% of today's 25 minute visit

## 2017-11-08 ENCOUNTER — Telehealth: Payer: Self-pay | Admitting: Sports Medicine

## 2017-11-08 ENCOUNTER — Encounter: Payer: Self-pay | Admitting: Endocrinology

## 2017-11-08 ENCOUNTER — Telehealth: Payer: Self-pay | Admitting: Dietician

## 2017-11-08 ENCOUNTER — Ambulatory Visit (INDEPENDENT_AMBULATORY_CARE_PROVIDER_SITE_OTHER): Payer: Medicare Other | Admitting: Endocrinology

## 2017-11-08 VITALS — BP 146/84 | HR 64 | Ht 76.0 in | Wt >= 6400 oz

## 2017-11-08 DIAGNOSIS — E1165 Type 2 diabetes mellitus with hyperglycemia: Secondary | ICD-10-CM | POA: Diagnosis not present

## 2017-11-08 DIAGNOSIS — E1142 Type 2 diabetes mellitus with diabetic polyneuropathy: Secondary | ICD-10-CM | POA: Diagnosis not present

## 2017-11-08 DIAGNOSIS — D631 Anemia in chronic kidney disease: Secondary | ICD-10-CM | POA: Diagnosis not present

## 2017-11-08 DIAGNOSIS — E1122 Type 2 diabetes mellitus with diabetic chronic kidney disease: Secondary | ICD-10-CM | POA: Diagnosis not present

## 2017-11-08 DIAGNOSIS — E11621 Type 2 diabetes mellitus with foot ulcer: Secondary | ICD-10-CM | POA: Diagnosis not present

## 2017-11-08 DIAGNOSIS — I129 Hypertensive chronic kidney disease with stage 1 through stage 4 chronic kidney disease, or unspecified chronic kidney disease: Secondary | ICD-10-CM | POA: Diagnosis not present

## 2017-11-08 DIAGNOSIS — E11311 Type 2 diabetes mellitus with unspecified diabetic retinopathy with macular edema: Secondary | ICD-10-CM | POA: Diagnosis not present

## 2017-11-08 DIAGNOSIS — Z794 Long term (current) use of insulin: Secondary | ICD-10-CM

## 2017-11-08 DIAGNOSIS — N183 Chronic kidney disease, stage 3 (moderate): Secondary | ICD-10-CM | POA: Diagnosis not present

## 2017-11-08 DIAGNOSIS — L97522 Non-pressure chronic ulcer of other part of left foot with fat layer exposed: Secondary | ICD-10-CM | POA: Diagnosis not present

## 2017-11-08 DIAGNOSIS — Z7901 Long term (current) use of anticoagulants: Secondary | ICD-10-CM | POA: Diagnosis not present

## 2017-11-08 DIAGNOSIS — I480 Paroxysmal atrial fibrillation: Secondary | ICD-10-CM | POA: Diagnosis not present

## 2017-11-08 DIAGNOSIS — G894 Chronic pain syndrome: Secondary | ICD-10-CM | POA: Diagnosis not present

## 2017-11-08 NOTE — Telephone Encounter (Signed)
Brief Nutrition Note Per Dr. Dwyane Dee, patient needs to be scheduled for an appointment for follow up about his diabetes and would be best if his wife was there.   Called patient.  Patient's wife is not at his home often and he does most of the cooking.   Appointment made for patient to follow up with me 11/18/17 at 1 pm.  Antonieta Iba, RD, LDN, CDE

## 2017-11-08 NOTE — Telephone Encounter (Signed)
Pt called because he has not received a call from the [facility] to schedule appt. He had two toes amputated and is getting concerned about wound that is splitting open. According to Nurse wound is discharging a lot and needs to be seen.

## 2017-11-08 NOTE — Telephone Encounter (Signed)
Can you confirmthe meter he uses? He has 2 types on his med list and I filled one in Jan with 12 refills

## 2017-11-08 NOTE — Telephone Encounter (Signed)
[facility] for betadine wet to dry dressing to left foot 3 times a week until [place] changes the care order.

## 2017-11-08 NOTE — Telephone Encounter (Signed)
Val  Please follow up to see when the wound care center will be seeing him. Also find out if the discharge has odor or color to it? If so send Bactrim DS to pharmacy take bid x 14 days and have home nurse change to using betadine wet to dry dressings Thanks Dr. Cannon Kettle

## 2017-11-08 NOTE — Telephone Encounter (Signed)
Verified with patient that he uses the Accu-Chek Aviva Plus glucometer. Hubbard Hartshorn, RN, BSN

## 2017-11-08 NOTE — Telephone Encounter (Signed)
Pt states the wound has the same reddish color and no odor from the drainage. I told pt I would send wound care order to [facility] and his [place] appt was 11/19/2017 at 8:00am with Dr. Dellia Nims. I told pt if the drainage changed color or became cloudy and or with an odor to call our office. Pt states understanding.

## 2017-11-08 NOTE — Telephone Encounter (Addendum)
Unable to speak with pt the home phone line was busy. Unable to speak with pt the alternate home phone was busy.

## 2017-11-08 NOTE — Telephone Encounter (Signed)
I scheduled pt at [facility] 11/19/2017 at 8:00am with Dr. Dellia Nims. Faxed required referral form, LOV and demographics to [facility].

## 2017-11-09 NOTE — Telephone Encounter (Signed)
Ok thanks 

## 2017-11-10 DIAGNOSIS — E11311 Type 2 diabetes mellitus with unspecified diabetic retinopathy with macular edema: Secondary | ICD-10-CM | POA: Diagnosis not present

## 2017-11-10 DIAGNOSIS — Z7901 Long term (current) use of anticoagulants: Secondary | ICD-10-CM | POA: Diagnosis not present

## 2017-11-10 DIAGNOSIS — N183 Chronic kidney disease, stage 3 (moderate): Secondary | ICD-10-CM | POA: Diagnosis not present

## 2017-11-10 DIAGNOSIS — L97522 Non-pressure chronic ulcer of other part of left foot with fat layer exposed: Secondary | ICD-10-CM | POA: Diagnosis not present

## 2017-11-10 DIAGNOSIS — I480 Paroxysmal atrial fibrillation: Secondary | ICD-10-CM | POA: Diagnosis not present

## 2017-11-10 DIAGNOSIS — E11621 Type 2 diabetes mellitus with foot ulcer: Secondary | ICD-10-CM | POA: Diagnosis not present

## 2017-11-10 DIAGNOSIS — I129 Hypertensive chronic kidney disease with stage 1 through stage 4 chronic kidney disease, or unspecified chronic kidney disease: Secondary | ICD-10-CM | POA: Diagnosis not present

## 2017-11-10 DIAGNOSIS — E1142 Type 2 diabetes mellitus with diabetic polyneuropathy: Secondary | ICD-10-CM | POA: Diagnosis not present

## 2017-11-10 DIAGNOSIS — D631 Anemia in chronic kidney disease: Secondary | ICD-10-CM | POA: Diagnosis not present

## 2017-11-10 DIAGNOSIS — G894 Chronic pain syndrome: Secondary | ICD-10-CM | POA: Diagnosis not present

## 2017-11-10 DIAGNOSIS — E1122 Type 2 diabetes mellitus with diabetic chronic kidney disease: Secondary | ICD-10-CM | POA: Diagnosis not present

## 2017-11-11 DIAGNOSIS — E113311 Type 2 diabetes mellitus with moderate nonproliferative diabetic retinopathy with macular edema, right eye: Secondary | ICD-10-CM | POA: Diagnosis not present

## 2017-11-12 ENCOUNTER — Other Ambulatory Visit: Payer: Self-pay | Admitting: *Deleted

## 2017-11-12 DIAGNOSIS — E1142 Type 2 diabetes mellitus with diabetic polyneuropathy: Secondary | ICD-10-CM | POA: Diagnosis not present

## 2017-11-12 DIAGNOSIS — E1122 Type 2 diabetes mellitus with diabetic chronic kidney disease: Secondary | ICD-10-CM | POA: Diagnosis not present

## 2017-11-12 DIAGNOSIS — E11621 Type 2 diabetes mellitus with foot ulcer: Secondary | ICD-10-CM | POA: Diagnosis not present

## 2017-11-12 DIAGNOSIS — I10 Essential (primary) hypertension: Secondary | ICD-10-CM

## 2017-11-12 DIAGNOSIS — Z7901 Long term (current) use of anticoagulants: Secondary | ICD-10-CM | POA: Diagnosis not present

## 2017-11-12 DIAGNOSIS — L97522 Non-pressure chronic ulcer of other part of left foot with fat layer exposed: Secondary | ICD-10-CM | POA: Diagnosis not present

## 2017-11-12 DIAGNOSIS — I480 Paroxysmal atrial fibrillation: Secondary | ICD-10-CM | POA: Diagnosis not present

## 2017-11-12 DIAGNOSIS — E11311 Type 2 diabetes mellitus with unspecified diabetic retinopathy with macular edema: Secondary | ICD-10-CM | POA: Diagnosis not present

## 2017-11-12 DIAGNOSIS — D631 Anemia in chronic kidney disease: Secondary | ICD-10-CM | POA: Diagnosis not present

## 2017-11-12 DIAGNOSIS — N183 Chronic kidney disease, stage 3 (moderate): Secondary | ICD-10-CM | POA: Diagnosis not present

## 2017-11-12 DIAGNOSIS — G894 Chronic pain syndrome: Secondary | ICD-10-CM | POA: Diagnosis not present

## 2017-11-12 DIAGNOSIS — I129 Hypertensive chronic kidney disease with stage 1 through stage 4 chronic kidney disease, or unspecified chronic kidney disease: Secondary | ICD-10-CM | POA: Diagnosis not present

## 2017-11-12 MED ORDER — METOPROLOL SUCCINATE ER 25 MG PO TB24: ORAL_TABLET | ORAL | 3 refills | Status: DC

## 2017-11-12 MED ORDER — VICTOZA 18 MG/3ML ~~LOC~~ SOPN: PEN_INJECTOR | SUBCUTANEOUS | 3 refills | Status: DC

## 2017-11-12 MED ORDER — GABAPENTIN 300 MG PO CAPS: ORAL_CAPSULE | ORAL | 3 refills | Status: DC

## 2017-11-12 NOTE — Telephone Encounter (Signed)
Refilled medications, I did not refill Humulin R U-500 insulin as this is prescribed by his endocrinolgist.

## 2017-11-14 ENCOUNTER — Other Ambulatory Visit: Payer: Self-pay | Admitting: Endocrinology

## 2017-11-14 ENCOUNTER — Other Ambulatory Visit: Payer: Self-pay | Admitting: Internal Medicine

## 2017-11-15 DIAGNOSIS — G894 Chronic pain syndrome: Secondary | ICD-10-CM | POA: Diagnosis not present

## 2017-11-15 DIAGNOSIS — I480 Paroxysmal atrial fibrillation: Secondary | ICD-10-CM | POA: Diagnosis not present

## 2017-11-15 DIAGNOSIS — L97522 Non-pressure chronic ulcer of other part of left foot with fat layer exposed: Secondary | ICD-10-CM | POA: Diagnosis not present

## 2017-11-15 DIAGNOSIS — E11621 Type 2 diabetes mellitus with foot ulcer: Secondary | ICD-10-CM | POA: Diagnosis not present

## 2017-11-15 DIAGNOSIS — E1142 Type 2 diabetes mellitus with diabetic polyneuropathy: Secondary | ICD-10-CM | POA: Diagnosis not present

## 2017-11-15 DIAGNOSIS — N183 Chronic kidney disease, stage 3 (moderate): Secondary | ICD-10-CM | POA: Diagnosis not present

## 2017-11-15 DIAGNOSIS — D631 Anemia in chronic kidney disease: Secondary | ICD-10-CM | POA: Diagnosis not present

## 2017-11-15 DIAGNOSIS — E11311 Type 2 diabetes mellitus with unspecified diabetic retinopathy with macular edema: Secondary | ICD-10-CM | POA: Diagnosis not present

## 2017-11-15 DIAGNOSIS — Z7901 Long term (current) use of anticoagulants: Secondary | ICD-10-CM | POA: Diagnosis not present

## 2017-11-15 DIAGNOSIS — E1122 Type 2 diabetes mellitus with diabetic chronic kidney disease: Secondary | ICD-10-CM | POA: Diagnosis not present

## 2017-11-15 DIAGNOSIS — I129 Hypertensive chronic kidney disease with stage 1 through stage 4 chronic kidney disease, or unspecified chronic kidney disease: Secondary | ICD-10-CM | POA: Diagnosis not present

## 2017-11-17 ENCOUNTER — Other Ambulatory Visit: Payer: Self-pay | Admitting: Internal Medicine

## 2017-11-17 DIAGNOSIS — Z7901 Long term (current) use of anticoagulants: Secondary | ICD-10-CM | POA: Diagnosis not present

## 2017-11-17 DIAGNOSIS — E1122 Type 2 diabetes mellitus with diabetic chronic kidney disease: Secondary | ICD-10-CM | POA: Diagnosis not present

## 2017-11-17 DIAGNOSIS — G894 Chronic pain syndrome: Secondary | ICD-10-CM | POA: Diagnosis not present

## 2017-11-17 DIAGNOSIS — L97522 Non-pressure chronic ulcer of other part of left foot with fat layer exposed: Secondary | ICD-10-CM | POA: Diagnosis not present

## 2017-11-17 DIAGNOSIS — I480 Paroxysmal atrial fibrillation: Secondary | ICD-10-CM | POA: Diagnosis not present

## 2017-11-17 DIAGNOSIS — D631 Anemia in chronic kidney disease: Secondary | ICD-10-CM | POA: Diagnosis not present

## 2017-11-17 DIAGNOSIS — N183 Chronic kidney disease, stage 3 (moderate): Secondary | ICD-10-CM | POA: Diagnosis not present

## 2017-11-17 DIAGNOSIS — E1142 Type 2 diabetes mellitus with diabetic polyneuropathy: Secondary | ICD-10-CM | POA: Diagnosis not present

## 2017-11-17 DIAGNOSIS — I129 Hypertensive chronic kidney disease with stage 1 through stage 4 chronic kidney disease, or unspecified chronic kidney disease: Secondary | ICD-10-CM | POA: Diagnosis not present

## 2017-11-17 DIAGNOSIS — E11621 Type 2 diabetes mellitus with foot ulcer: Secondary | ICD-10-CM | POA: Diagnosis not present

## 2017-11-17 DIAGNOSIS — E11311 Type 2 diabetes mellitus with unspecified diabetic retinopathy with macular edema: Secondary | ICD-10-CM | POA: Diagnosis not present

## 2017-11-18 ENCOUNTER — Ambulatory Visit: Payer: Medicare Other | Admitting: Dietician

## 2017-11-19 ENCOUNTER — Encounter (HOSPITAL_BASED_OUTPATIENT_CLINIC_OR_DEPARTMENT_OTHER): Payer: Medicare Other | Attending: Internal Medicine

## 2017-11-19 DIAGNOSIS — N186 End stage renal disease: Secondary | ICD-10-CM | POA: Insufficient documentation

## 2017-11-19 DIAGNOSIS — L97522 Non-pressure chronic ulcer of other part of left foot with fat layer exposed: Secondary | ICD-10-CM | POA: Diagnosis not present

## 2017-11-19 DIAGNOSIS — G4730 Sleep apnea, unspecified: Secondary | ICD-10-CM | POA: Insufficient documentation

## 2017-11-19 DIAGNOSIS — E1122 Type 2 diabetes mellitus with diabetic chronic kidney disease: Secondary | ICD-10-CM | POA: Diagnosis not present

## 2017-11-19 DIAGNOSIS — E11621 Type 2 diabetes mellitus with foot ulcer: Secondary | ICD-10-CM | POA: Diagnosis not present

## 2017-11-19 DIAGNOSIS — Z7984 Long term (current) use of oral hypoglycemic drugs: Secondary | ICD-10-CM | POA: Diagnosis not present

## 2017-11-19 DIAGNOSIS — E11319 Type 2 diabetes mellitus with unspecified diabetic retinopathy without macular edema: Secondary | ICD-10-CM | POA: Insufficient documentation

## 2017-11-19 DIAGNOSIS — E1142 Type 2 diabetes mellitus with diabetic polyneuropathy: Secondary | ICD-10-CM | POA: Diagnosis not present

## 2017-11-19 DIAGNOSIS — Z87891 Personal history of nicotine dependence: Secondary | ICD-10-CM | POA: Diagnosis not present

## 2017-11-19 DIAGNOSIS — Z9989 Dependence on other enabling machines and devices: Secondary | ICD-10-CM | POA: Diagnosis not present

## 2017-11-19 DIAGNOSIS — Z89429 Acquired absence of other toe(s), unspecified side: Secondary | ICD-10-CM | POA: Insufficient documentation

## 2017-11-20 ENCOUNTER — Other Ambulatory Visit: Payer: Self-pay | Admitting: Internal Medicine

## 2017-11-20 ENCOUNTER — Other Ambulatory Visit: Payer: Self-pay | Admitting: Endocrinology

## 2017-11-22 ENCOUNTER — Other Ambulatory Visit: Payer: Self-pay | Admitting: *Deleted

## 2017-11-22 DIAGNOSIS — D631 Anemia in chronic kidney disease: Secondary | ICD-10-CM | POA: Diagnosis not present

## 2017-11-22 DIAGNOSIS — I480 Paroxysmal atrial fibrillation: Secondary | ICD-10-CM | POA: Diagnosis not present

## 2017-11-22 DIAGNOSIS — L97522 Non-pressure chronic ulcer of other part of left foot with fat layer exposed: Secondary | ICD-10-CM | POA: Diagnosis not present

## 2017-11-22 DIAGNOSIS — E11311 Type 2 diabetes mellitus with unspecified diabetic retinopathy with macular edema: Secondary | ICD-10-CM | POA: Diagnosis not present

## 2017-11-22 DIAGNOSIS — Z7901 Long term (current) use of anticoagulants: Secondary | ICD-10-CM | POA: Diagnosis not present

## 2017-11-22 DIAGNOSIS — I129 Hypertensive chronic kidney disease with stage 1 through stage 4 chronic kidney disease, or unspecified chronic kidney disease: Secondary | ICD-10-CM | POA: Diagnosis not present

## 2017-11-22 DIAGNOSIS — N183 Chronic kidney disease, stage 3 (moderate): Secondary | ICD-10-CM | POA: Diagnosis not present

## 2017-11-22 DIAGNOSIS — E1142 Type 2 diabetes mellitus with diabetic polyneuropathy: Secondary | ICD-10-CM | POA: Diagnosis not present

## 2017-11-22 DIAGNOSIS — E11621 Type 2 diabetes mellitus with foot ulcer: Secondary | ICD-10-CM | POA: Diagnosis not present

## 2017-11-22 DIAGNOSIS — G894 Chronic pain syndrome: Secondary | ICD-10-CM | POA: Diagnosis not present

## 2017-11-22 DIAGNOSIS — E1122 Type 2 diabetes mellitus with diabetic chronic kidney disease: Secondary | ICD-10-CM | POA: Diagnosis not present

## 2017-11-22 MED ORDER — ATORVASTATIN CALCIUM 20 MG PO TABS: ORAL_TABLET | ORAL | 1 refills | Status: DC

## 2017-11-23 ENCOUNTER — Other Ambulatory Visit: Payer: Self-pay | Admitting: Internal Medicine

## 2017-11-23 ENCOUNTER — Ambulatory Visit (HOSPITAL_COMMUNITY)
Admission: RE | Admit: 2017-11-23 | Discharge: 2017-11-23 | Disposition: A | Discharge status: Home / Self Care | Payer: Medicare Other | Source: Ambulatory Visit | Attending: Internal Medicine | Admitting: Internal Medicine

## 2017-11-23 ENCOUNTER — Other Ambulatory Visit (HOSPITAL_BASED_OUTPATIENT_CLINIC_OR_DEPARTMENT_OTHER): Payer: Self-pay | Admitting: Internal Medicine

## 2017-11-23 DIAGNOSIS — S91302A Unspecified open wound, left foot, initial encounter: Secondary | ICD-10-CM | POA: Diagnosis not present

## 2017-11-23 DIAGNOSIS — L942 Calcinosis cutis: Secondary | ICD-10-CM | POA: Diagnosis not present

## 2017-11-23 DIAGNOSIS — Z89422 Acquired absence of other left toe(s): Secondary | ICD-10-CM | POA: Diagnosis not present

## 2017-11-23 DIAGNOSIS — M86172 Other acute osteomyelitis, left ankle and foot: Secondary | ICD-10-CM | POA: Insufficient documentation

## 2017-11-24 ENCOUNTER — Other Ambulatory Visit: Payer: Self-pay | Admitting: Endocrinology

## 2017-11-24 ENCOUNTER — Other Ambulatory Visit: Payer: Self-pay | Admitting: *Deleted

## 2017-11-24 ENCOUNTER — Other Ambulatory Visit: Payer: Self-pay | Admitting: Internal Medicine

## 2017-11-24 DIAGNOSIS — E1122 Type 2 diabetes mellitus with diabetic chronic kidney disease: Secondary | ICD-10-CM | POA: Diagnosis not present

## 2017-11-24 DIAGNOSIS — E11621 Type 2 diabetes mellitus with foot ulcer: Secondary | ICD-10-CM | POA: Diagnosis not present

## 2017-11-24 DIAGNOSIS — G894 Chronic pain syndrome: Secondary | ICD-10-CM | POA: Diagnosis not present

## 2017-11-24 DIAGNOSIS — Z7901 Long term (current) use of anticoagulants: Secondary | ICD-10-CM | POA: Diagnosis not present

## 2017-11-24 DIAGNOSIS — I129 Hypertensive chronic kidney disease with stage 1 through stage 4 chronic kidney disease, or unspecified chronic kidney disease: Secondary | ICD-10-CM | POA: Diagnosis not present

## 2017-11-24 DIAGNOSIS — E11311 Type 2 diabetes mellitus with unspecified diabetic retinopathy with macular edema: Secondary | ICD-10-CM | POA: Diagnosis not present

## 2017-11-24 DIAGNOSIS — L97522 Non-pressure chronic ulcer of other part of left foot with fat layer exposed: Secondary | ICD-10-CM | POA: Diagnosis not present

## 2017-11-24 DIAGNOSIS — D631 Anemia in chronic kidney disease: Secondary | ICD-10-CM | POA: Diagnosis not present

## 2017-11-24 DIAGNOSIS — I480 Paroxysmal atrial fibrillation: Secondary | ICD-10-CM | POA: Diagnosis not present

## 2017-11-24 DIAGNOSIS — N183 Chronic kidney disease, stage 3 (moderate): Secondary | ICD-10-CM | POA: Diagnosis not present

## 2017-11-24 DIAGNOSIS — E1142 Type 2 diabetes mellitus with diabetic polyneuropathy: Secondary | ICD-10-CM | POA: Diagnosis not present

## 2017-11-24 MED ORDER — PROMETHAZINE HCL 25 MG PO TABS: ORAL_TABLET | ORAL | 0 refills | Status: DC

## 2017-11-24 NOTE — Telephone Encounter (Signed)
Called pt - prefers refills sent to [facility] rather than [facility]. Informed Atorvastatin was refilled on 5/13 and sent to [facility] - stated he will call them.

## 2017-11-24 NOTE — Telephone Encounter (Signed)
atorvastatin (LIPITOR) 20 MG tablet  promethazine (PHENERGAN) 25 MG tablet, Refill request @ [facility] on [address].

## 2017-11-25 ENCOUNTER — Other Ambulatory Visit: Payer: Self-pay | Admitting: Internal Medicine

## 2017-11-25 DIAGNOSIS — M545 Low back pain: Secondary | ICD-10-CM | POA: Diagnosis not present

## 2017-11-25 DIAGNOSIS — Z79891 Long term (current) use of opiate analgesic: Secondary | ICD-10-CM | POA: Diagnosis not present

## 2017-11-25 DIAGNOSIS — G894 Chronic pain syndrome: Secondary | ICD-10-CM | POA: Diagnosis not present

## 2017-11-25 DIAGNOSIS — M79672 Pain in left foot: Secondary | ICD-10-CM | POA: Diagnosis not present

## 2017-11-26 ENCOUNTER — Ambulatory Visit: Payer: Medicare Other | Admitting: Dietician

## 2017-11-26 DIAGNOSIS — I129 Hypertensive chronic kidney disease with stage 1 through stage 4 chronic kidney disease, or unspecified chronic kidney disease: Secondary | ICD-10-CM | POA: Diagnosis not present

## 2017-11-26 DIAGNOSIS — Z7901 Long term (current) use of anticoagulants: Secondary | ICD-10-CM | POA: Diagnosis not present

## 2017-11-26 DIAGNOSIS — G894 Chronic pain syndrome: Secondary | ICD-10-CM | POA: Diagnosis not present

## 2017-11-26 DIAGNOSIS — L97522 Non-pressure chronic ulcer of other part of left foot with fat layer exposed: Secondary | ICD-10-CM | POA: Diagnosis not present

## 2017-11-26 DIAGNOSIS — E1142 Type 2 diabetes mellitus with diabetic polyneuropathy: Secondary | ICD-10-CM | POA: Diagnosis not present

## 2017-11-26 DIAGNOSIS — I480 Paroxysmal atrial fibrillation: Secondary | ICD-10-CM | POA: Diagnosis not present

## 2017-11-26 DIAGNOSIS — E1122 Type 2 diabetes mellitus with diabetic chronic kidney disease: Secondary | ICD-10-CM | POA: Diagnosis not present

## 2017-11-26 DIAGNOSIS — N183 Chronic kidney disease, stage 3 (moderate): Secondary | ICD-10-CM | POA: Diagnosis not present

## 2017-11-26 DIAGNOSIS — E11311 Type 2 diabetes mellitus with unspecified diabetic retinopathy with macular edema: Secondary | ICD-10-CM | POA: Diagnosis not present

## 2017-11-26 DIAGNOSIS — D631 Anemia in chronic kidney disease: Secondary | ICD-10-CM | POA: Diagnosis not present

## 2017-11-26 DIAGNOSIS — E11621 Type 2 diabetes mellitus with foot ulcer: Secondary | ICD-10-CM | POA: Diagnosis not present

## 2017-11-29 DIAGNOSIS — E113512 Type 2 diabetes mellitus with proliferative diabetic retinopathy with macular edema, left eye: Secondary | ICD-10-CM | POA: Diagnosis not present

## 2017-11-30 ENCOUNTER — Other Ambulatory Visit: Payer: Self-pay

## 2017-11-30 ENCOUNTER — Encounter: Payer: Self-pay | Admitting: Internal Medicine

## 2017-11-30 ENCOUNTER — Ambulatory Visit (INDEPENDENT_AMBULATORY_CARE_PROVIDER_SITE_OTHER): Payer: Medicare Other | Admitting: Internal Medicine

## 2017-11-30 VITALS — BP 160/59 | HR 69 | Temp 98.8°F | Ht 76.0 in | Wt >= 6400 oz

## 2017-11-30 DIAGNOSIS — N183 Chronic kidney disease, stage 3 unspecified: Secondary | ICD-10-CM

## 2017-11-30 DIAGNOSIS — L97529 Non-pressure chronic ulcer of other part of left foot with unspecified severity: Secondary | ICD-10-CM

## 2017-11-30 DIAGNOSIS — D649 Anemia, unspecified: Secondary | ICD-10-CM

## 2017-11-30 DIAGNOSIS — E118 Type 2 diabetes mellitus with unspecified complications: Secondary | ICD-10-CM

## 2017-11-30 DIAGNOSIS — L989 Disorder of the skin and subcutaneous tissue, unspecified: Secondary | ICD-10-CM | POA: Diagnosis not present

## 2017-11-30 DIAGNOSIS — D631 Anemia in chronic kidney disease: Secondary | ICD-10-CM | POA: Diagnosis not present

## 2017-11-30 DIAGNOSIS — I10 Essential (primary) hypertension: Secondary | ICD-10-CM | POA: Diagnosis not present

## 2017-11-30 DIAGNOSIS — G894 Chronic pain syndrome: Secondary | ICD-10-CM | POA: Diagnosis not present

## 2017-11-30 DIAGNOSIS — E1122 Type 2 diabetes mellitus with diabetic chronic kidney disease: Secondary | ICD-10-CM | POA: Diagnosis not present

## 2017-11-30 DIAGNOSIS — R110 Nausea: Secondary | ICD-10-CM

## 2017-11-30 DIAGNOSIS — L97522 Non-pressure chronic ulcer of other part of left foot with fat layer exposed: Secondary | ICD-10-CM | POA: Diagnosis not present

## 2017-11-30 DIAGNOSIS — Z9989 Dependence on other enabling machines and devices: Secondary | ICD-10-CM

## 2017-11-30 DIAGNOSIS — Z7901 Long term (current) use of anticoagulants: Secondary | ICD-10-CM | POA: Diagnosis not present

## 2017-11-30 DIAGNOSIS — K219 Gastro-esophageal reflux disease without esophagitis: Secondary | ICD-10-CM | POA: Diagnosis not present

## 2017-11-30 DIAGNOSIS — E11621 Type 2 diabetes mellitus with foot ulcer: Secondary | ICD-10-CM | POA: Diagnosis not present

## 2017-11-30 DIAGNOSIS — E11649 Type 2 diabetes mellitus with hypoglycemia without coma: Secondary | ICD-10-CM

## 2017-11-30 DIAGNOSIS — E1142 Type 2 diabetes mellitus with diabetic polyneuropathy: Secondary | ICD-10-CM | POA: Diagnosis not present

## 2017-11-30 DIAGNOSIS — I480 Paroxysmal atrial fibrillation: Secondary | ICD-10-CM | POA: Diagnosis not present

## 2017-11-30 DIAGNOSIS — E559 Vitamin D deficiency, unspecified: Secondary | ICD-10-CM | POA: Diagnosis not present

## 2017-11-30 DIAGNOSIS — H6192 Disorder of left external ear, unspecified: Secondary | ICD-10-CM

## 2017-11-30 DIAGNOSIS — G4733 Obstructive sleep apnea (adult) (pediatric): Secondary | ICD-10-CM

## 2017-11-30 DIAGNOSIS — Z79899 Other long term (current) drug therapy: Secondary | ICD-10-CM

## 2017-11-30 DIAGNOSIS — Z6841 Body Mass Index (BMI) 40.0 and over, adult: Secondary | ICD-10-CM

## 2017-11-30 DIAGNOSIS — Z9889 Other specified postprocedural states: Secondary | ICD-10-CM

## 2017-11-30 DIAGNOSIS — I129 Hypertensive chronic kidney disease with stage 1 through stage 4 chronic kidney disease, or unspecified chronic kidney disease: Secondary | ICD-10-CM | POA: Diagnosis not present

## 2017-11-30 DIAGNOSIS — Z794 Long term (current) use of insulin: Secondary | ICD-10-CM

## 2017-11-30 DIAGNOSIS — E11311 Type 2 diabetes mellitus with unspecified diabetic retinopathy with macular edema: Secondary | ICD-10-CM | POA: Diagnosis not present

## 2017-11-30 NOTE — Assessment & Plan Note (Signed)
-  This problem is chronic and stable -We will check a BMP today

## 2017-11-30 NOTE — Assessment & Plan Note (Signed)
-  This problem is chronic and stable -Patient is compliant with Eliquis and metoprolol -He appears to be in a regular rhythm on exam today  -No further work-up for now

## 2017-11-30 NOTE — Assessment & Plan Note (Signed)
-  This problem is chronic and stable -Patient has been on Phenergan chronically for this -At his last visit I instructed him to hold his Phenergan and use it only as needed.  Patient states that he stopped using it for 3 to 4 days but then developed recurrent nausea and restarted the medication -I suspect that he may have some gastroparesis given his chronic uncontrolled diabetes but his symptoms appear to be well controlled on Phenergan -I explained to the patient that he does not need to take this medication every day and that he needs to take it only as needed -He expresses understanding and will use the medication only as needed

## 2017-11-30 NOTE — Assessment & Plan Note (Signed)
-  This problem is chronic and stable -Patient is trying to lose weight by dieting but has been unable to exercise secondary to his left foot ulcer -He is down approximately 10 pounds since February -I congratulated him on his weight loss and encouraged him to continue doing what he is doing -Patient will continue to try to lose weight and will reassess this on his follow-up visit

## 2017-11-30 NOTE — Patient Instructions (Signed)
-  It was a pleasure seeing you today -Your weight is down 10 pounds since February.  Keep up the great work -We will check some blood work on you today -We will refer you to dermatology for the lesion behind your left ear -Please follow-up with me in 3 months -Call me with any questions

## 2017-11-30 NOTE — Progress Notes (Signed)
   Subjective:    Patient ID: [name], male    DOB: 08/30/1950, 67 y.o.   MRN: [id number]  HPI  I have seen and examined this patient.  Patient is here for routine follow-up of his hypertension and CKD.  Patient is complaining of a lesion behind his left ear which he states has been growing in size.  Patient has been following up with podiatry for his chronic left foot ulcer but states that it has not been improving and he was referred again to the wound care center and is following up with them.  He has no other complaints otherwise and is compliant with all his medications.  Review of Systems  Constitutional: Negative.   HENT: Negative.   Respiratory: Negative.   Cardiovascular: Negative.   Gastrointestinal: Negative.   Musculoskeletal: Negative.   Skin:       He complains of a lesion behind his left ear which has been growing in size  Neurological: Negative.   Psychiatric/Behavioral: Negative.        Objective:   Physical Exam  Constitutional: He is oriented to person, place, and time. He appears well-developed and well-nourished.  HENT:  Head: Normocephalic and atraumatic.  Mouth/Throat: No oropharyngeal exudate.  Neck: Neck supple.  Cardiovascular: Normal rate and regular rhythm.  Pulmonary/Chest: Effort normal and breath sounds normal. He has no wheezes. He has no rales.  Abdominal: Soft. Bowel sounds are normal. He exhibits no distension. There is no tenderness.  Musculoskeletal: Normal range of motion. He exhibits edema.  1+ bilateral lower extremity edema noted Left foot dressing intact and in offloading boot  Lymphadenopathy:    He has no cervical adenopathy.  Neurological: He is alert and oriented to person, place, and time.  Skin:  Patient noted to have a nontender polypoid lesion approximately 2 cm behind his left ear  Psychiatric: He has a normal mood and affect. His behavior is normal.          Assessment & Plan:  Please see problem based  charting for assessment and plan:

## 2017-11-30 NOTE — Assessment & Plan Note (Signed)
BP Readings from Last 3 Encounters:  11/30/17 (!) 160/59  11/08/17 (!) 146/84  10/05/17 139/77    Lab Results  Component Value Date   NA 139 07/22/2017   K 4.0 07/22/2017   CREATININE 1.66 (H) 07/22/2017    Assessment: Blood pressure control:  Uncontrolled Progress toward BP goal:   Deteriorated Comments: Patient is compliant with metoprolol 25 mg, Lasix 40 mg and enalapril 40 mg  Plan: Medications:  continue current medications Educational resources provided:   Self management tools provided:   Other plans: We will check BMP today.  Will consider adding an additional blood pressure medication on follow-up if his blood pressure remains elevated

## 2017-11-30 NOTE — Assessment & Plan Note (Signed)
-  Patient follows up with Dr. Dwyane Dee for this -His last A1c was 8.5 -We will continue with current diabetic regimen including insulin via V-GO pump and Victoza -Patient will follow-up with dietitian at his endocrinologist office -No further work-up at this time

## 2017-11-30 NOTE — Assessment & Plan Note (Signed)
-  Patient complains of a skin lesion over the back of his left ear -He states he has noticed it over the last couple of months and it is been growing slowly in size -On exam he has a polypoid, approximately 1-1/2 to 2 cm lesion over the back of his left ear which is nontender with no surrounding erythema and no discharge -Patient would like to be referred to dermatology to have this excised -Dermatology referral was placed today

## 2017-11-30 NOTE — Assessment & Plan Note (Signed)
-  This problem is chronic and stable -I suspect that he has anemia likely secondary to his chronic renal disease -We will check a repeat CBC today -No further work-up at this time

## 2017-11-30 NOTE — Assessment & Plan Note (Signed)
-  This problem is chronic and stable -Patient states that he is compliant with his CPAP machine at night -He still has occasional daytime somnolence but states that this is much improved -No further work-up at this time

## 2017-11-30 NOTE — Assessment & Plan Note (Signed)
-  This problem is chronic and stable -Patient is compliant with his vitamin D supplements -We will check a 25-hydroxy vitamin D level today

## 2017-11-30 NOTE — Assessment & Plan Note (Signed)
-  This problem is chronic and stable -Patient follows up with Dr. Mirna Mires and receives all his pain medications from there -Patient states that his pain is well controlled and that he will continue to follow-up with the pain clinic -No further work-up at this time

## 2017-11-30 NOTE — Assessment & Plan Note (Signed)
-  This problem is chronic and stable -Patient has been following up with podiatry for the last year and a half for an ulcer over his left foot and has had multiple surgeries including debridements and grafts without successful healing of the ulcer over his foot -He is now following up with the wound care center per podiatry recommendations and home nurses are helping with dressing changes -He is also using an offloading boot -We will continue to monitor closely

## 2017-11-30 NOTE — Assessment & Plan Note (Signed)
-  This problem is chronic and stable  -patient is compliant with omeprazole 20 mg daily and his symptoms are well controlled on this dose -We will continue with this medication for now.

## 2017-12-01 ENCOUNTER — Telehealth: Payer: Self-pay | Admitting: Internal Medicine

## 2017-12-01 LAB — BMP8+ANION GAP
Anion Gap: 16 mmol/L (ref 10.0–18.0)
BUN/Creatinine Ratio: 15 (ref 10–24)
BUN: 25 mg/dL (ref 8–27)
CALCIUM: 8.7 mg/dL (ref 8.6–10.2)
CO2: 20 mmol/L (ref 20–29)
Chloride: 107 mmol/L — ABNORMAL HIGH (ref 96–106)
Creatinine, Ser: 1.64 mg/dL — ABNORMAL HIGH (ref 0.76–1.27)
GFR calc non Af Amer: 43 mL/min/{1.73_m2} — ABNORMAL LOW (ref >59)
GFR, EST AFRICAN AMERICAN: 49 mL/min/{1.73_m2} — AB (ref >59)
Glucose: 188 mg/dL — ABNORMAL HIGH (ref 65–99)
Potassium: 4.9 mmol/L (ref 3.5–5.2)
Sodium: 143 mmol/L (ref 134–144)

## 2017-12-01 LAB — VITAMIN D 25 HYDROXY (VIT D DEFICIENCY, FRACTURES): VIT D 25 HYDROXY: 32.7 ng/mL (ref 30.0–100.0)

## 2017-12-01 LAB — CBC WITH DIFFERENTIAL/PLATELET
BASOS ABS: 0 10*3/uL (ref 0.0–0.2)
Basos: 0 %
EOS (ABSOLUTE): 0.2 10*3/uL (ref 0.0–0.4)
Eos: 4 %
Hematocrit: 35.5 % — ABNORMAL LOW (ref 37.5–51.0)
Hemoglobin: 11.1 g/dL — ABNORMAL LOW (ref 13.0–17.7)
Immature Grans (Abs): 0 10*3/uL (ref 0.0–0.1)
Immature Granulocytes: 0 %
LYMPHS: 29 %
Lymphocytes Absolute: 1.5 10*3/uL (ref 0.7–3.1)
MCH: 28.5 pg (ref 26.6–33.0)
MCHC: 31.3 g/dL — AB (ref 31.5–35.7)
MCV: 91 fL (ref 79–97)
MONOS ABS: 0.6 10*3/uL (ref 0.1–0.9)
Monocytes: 12 %
NEUTROS ABS: 2.9 10*3/uL (ref 1.4–7.0)
Neutrophils: 55 %
Platelets: 158 10*3/uL (ref 150–450)
RBC: 3.9 x10E6/uL — AB (ref 4.14–5.80)
RDW: 15.6 % — AB (ref 12.3–15.4)
WBC: 5.3 10*3/uL (ref 3.4–10.8)

## 2017-12-01 NOTE — Telephone Encounter (Signed)
I called the patient to discuss the results of his blood work with him.  Patient's creatinine is at baseline.  Patient also noted to have a hemoglobin of 11.1 which is his baseline as well.  I suspect that his mild normocytic anemia is likely secondary to his CKD.  His vitamin D level is low normal.  Patient was encouraged to continue taking his vitamin D supplements.  No further work-up at this time.  Patient expresses understanding and is in agreement with plan.

## 2017-12-02 DIAGNOSIS — G894 Chronic pain syndrome: Secondary | ICD-10-CM | POA: Diagnosis not present

## 2017-12-02 DIAGNOSIS — N183 Chronic kidney disease, stage 3 (moderate): Secondary | ICD-10-CM | POA: Diagnosis not present

## 2017-12-02 DIAGNOSIS — E11311 Type 2 diabetes mellitus with unspecified diabetic retinopathy with macular edema: Secondary | ICD-10-CM | POA: Diagnosis not present

## 2017-12-02 DIAGNOSIS — L97522 Non-pressure chronic ulcer of other part of left foot with fat layer exposed: Secondary | ICD-10-CM | POA: Diagnosis not present

## 2017-12-02 DIAGNOSIS — E1122 Type 2 diabetes mellitus with diabetic chronic kidney disease: Secondary | ICD-10-CM | POA: Diagnosis not present

## 2017-12-02 DIAGNOSIS — D631 Anemia in chronic kidney disease: Secondary | ICD-10-CM | POA: Diagnosis not present

## 2017-12-02 DIAGNOSIS — E1142 Type 2 diabetes mellitus with diabetic polyneuropathy: Secondary | ICD-10-CM | POA: Diagnosis not present

## 2017-12-02 DIAGNOSIS — I480 Paroxysmal atrial fibrillation: Secondary | ICD-10-CM | POA: Diagnosis not present

## 2017-12-02 DIAGNOSIS — I129 Hypertensive chronic kidney disease with stage 1 through stage 4 chronic kidney disease, or unspecified chronic kidney disease: Secondary | ICD-10-CM | POA: Diagnosis not present

## 2017-12-02 DIAGNOSIS — Z7901 Long term (current) use of anticoagulants: Secondary | ICD-10-CM | POA: Diagnosis not present

## 2017-12-02 DIAGNOSIS — E11621 Type 2 diabetes mellitus with foot ulcer: Secondary | ICD-10-CM | POA: Diagnosis not present

## 2017-12-03 ENCOUNTER — Ambulatory Visit: Payer: Medicare Other | Admitting: Dietician

## 2017-12-03 DIAGNOSIS — L97522 Non-pressure chronic ulcer of other part of left foot with fat layer exposed: Secondary | ICD-10-CM | POA: Diagnosis not present

## 2017-12-03 DIAGNOSIS — E11319 Type 2 diabetes mellitus with unspecified diabetic retinopathy without macular edema: Secondary | ICD-10-CM | POA: Diagnosis not present

## 2017-12-03 DIAGNOSIS — N186 End stage renal disease: Secondary | ICD-10-CM | POA: Diagnosis not present

## 2017-12-03 DIAGNOSIS — E11621 Type 2 diabetes mellitus with foot ulcer: Secondary | ICD-10-CM | POA: Diagnosis not present

## 2017-12-03 DIAGNOSIS — Z9989 Dependence on other enabling machines and devices: Secondary | ICD-10-CM | POA: Diagnosis not present

## 2017-12-03 DIAGNOSIS — Z89429 Acquired absence of other toe(s), unspecified side: Secondary | ICD-10-CM | POA: Diagnosis not present

## 2017-12-03 DIAGNOSIS — E1142 Type 2 diabetes mellitus with diabetic polyneuropathy: Secondary | ICD-10-CM | POA: Diagnosis not present

## 2017-12-03 DIAGNOSIS — Z7984 Long term (current) use of oral hypoglycemic drugs: Secondary | ICD-10-CM | POA: Diagnosis not present

## 2017-12-03 DIAGNOSIS — E1122 Type 2 diabetes mellitus with diabetic chronic kidney disease: Secondary | ICD-10-CM | POA: Diagnosis not present

## 2017-12-03 DIAGNOSIS — Z87891 Personal history of nicotine dependence: Secondary | ICD-10-CM | POA: Diagnosis not present

## 2017-12-03 DIAGNOSIS — G4730 Sleep apnea, unspecified: Secondary | ICD-10-CM | POA: Diagnosis not present

## 2017-12-05 DIAGNOSIS — G4733 Obstructive sleep apnea (adult) (pediatric): Secondary | ICD-10-CM | POA: Diagnosis not present

## 2017-12-06 DIAGNOSIS — D631 Anemia in chronic kidney disease: Secondary | ICD-10-CM | POA: Diagnosis not present

## 2017-12-06 DIAGNOSIS — E1122 Type 2 diabetes mellitus with diabetic chronic kidney disease: Secondary | ICD-10-CM | POA: Diagnosis not present

## 2017-12-06 DIAGNOSIS — Z7901 Long term (current) use of anticoagulants: Secondary | ICD-10-CM | POA: Diagnosis not present

## 2017-12-06 DIAGNOSIS — G894 Chronic pain syndrome: Secondary | ICD-10-CM | POA: Diagnosis not present

## 2017-12-06 DIAGNOSIS — I480 Paroxysmal atrial fibrillation: Secondary | ICD-10-CM | POA: Diagnosis not present

## 2017-12-06 DIAGNOSIS — N183 Chronic kidney disease, stage 3 (moderate): Secondary | ICD-10-CM | POA: Diagnosis not present

## 2017-12-06 DIAGNOSIS — I129 Hypertensive chronic kidney disease with stage 1 through stage 4 chronic kidney disease, or unspecified chronic kidney disease: Secondary | ICD-10-CM | POA: Diagnosis not present

## 2017-12-06 DIAGNOSIS — E11311 Type 2 diabetes mellitus with unspecified diabetic retinopathy with macular edema: Secondary | ICD-10-CM | POA: Diagnosis not present

## 2017-12-06 DIAGNOSIS — E11621 Type 2 diabetes mellitus with foot ulcer: Secondary | ICD-10-CM | POA: Diagnosis not present

## 2017-12-06 DIAGNOSIS — E1142 Type 2 diabetes mellitus with diabetic polyneuropathy: Secondary | ICD-10-CM | POA: Diagnosis not present

## 2017-12-06 DIAGNOSIS — L97522 Non-pressure chronic ulcer of other part of left foot with fat layer exposed: Secondary | ICD-10-CM | POA: Diagnosis not present

## 2017-12-08 DIAGNOSIS — I129 Hypertensive chronic kidney disease with stage 1 through stage 4 chronic kidney disease, or unspecified chronic kidney disease: Secondary | ICD-10-CM | POA: Diagnosis not present

## 2017-12-08 DIAGNOSIS — L97522 Non-pressure chronic ulcer of other part of left foot with fat layer exposed: Secondary | ICD-10-CM | POA: Diagnosis not present

## 2017-12-08 DIAGNOSIS — E1142 Type 2 diabetes mellitus with diabetic polyneuropathy: Secondary | ICD-10-CM | POA: Diagnosis not present

## 2017-12-08 DIAGNOSIS — E1122 Type 2 diabetes mellitus with diabetic chronic kidney disease: Secondary | ICD-10-CM | POA: Diagnosis not present

## 2017-12-08 DIAGNOSIS — G894 Chronic pain syndrome: Secondary | ICD-10-CM | POA: Diagnosis not present

## 2017-12-08 DIAGNOSIS — E11621 Type 2 diabetes mellitus with foot ulcer: Secondary | ICD-10-CM | POA: Diagnosis not present

## 2017-12-08 DIAGNOSIS — D631 Anemia in chronic kidney disease: Secondary | ICD-10-CM | POA: Diagnosis not present

## 2017-12-08 DIAGNOSIS — Z7901 Long term (current) use of anticoagulants: Secondary | ICD-10-CM | POA: Diagnosis not present

## 2017-12-08 DIAGNOSIS — E11311 Type 2 diabetes mellitus with unspecified diabetic retinopathy with macular edema: Secondary | ICD-10-CM | POA: Diagnosis not present

## 2017-12-08 DIAGNOSIS — N183 Chronic kidney disease, stage 3 (moderate): Secondary | ICD-10-CM | POA: Diagnosis not present

## 2017-12-08 DIAGNOSIS — I480 Paroxysmal atrial fibrillation: Secondary | ICD-10-CM | POA: Diagnosis not present

## 2017-12-10 DIAGNOSIS — E1122 Type 2 diabetes mellitus with diabetic chronic kidney disease: Secondary | ICD-10-CM | POA: Diagnosis not present

## 2017-12-10 DIAGNOSIS — E1142 Type 2 diabetes mellitus with diabetic polyneuropathy: Secondary | ICD-10-CM | POA: Diagnosis not present

## 2017-12-10 DIAGNOSIS — G894 Chronic pain syndrome: Secondary | ICD-10-CM | POA: Diagnosis not present

## 2017-12-10 DIAGNOSIS — L97522 Non-pressure chronic ulcer of other part of left foot with fat layer exposed: Secondary | ICD-10-CM | POA: Diagnosis not present

## 2017-12-10 DIAGNOSIS — N183 Chronic kidney disease, stage 3 (moderate): Secondary | ICD-10-CM | POA: Diagnosis not present

## 2017-12-10 DIAGNOSIS — I480 Paroxysmal atrial fibrillation: Secondary | ICD-10-CM | POA: Diagnosis not present

## 2017-12-10 DIAGNOSIS — I129 Hypertensive chronic kidney disease with stage 1 through stage 4 chronic kidney disease, or unspecified chronic kidney disease: Secondary | ICD-10-CM | POA: Diagnosis not present

## 2017-12-10 DIAGNOSIS — Z7901 Long term (current) use of anticoagulants: Secondary | ICD-10-CM | POA: Diagnosis not present

## 2017-12-10 DIAGNOSIS — E11621 Type 2 diabetes mellitus with foot ulcer: Secondary | ICD-10-CM | POA: Diagnosis not present

## 2017-12-10 DIAGNOSIS — D631 Anemia in chronic kidney disease: Secondary | ICD-10-CM | POA: Diagnosis not present

## 2017-12-10 DIAGNOSIS — E11311 Type 2 diabetes mellitus with unspecified diabetic retinopathy with macular edema: Secondary | ICD-10-CM | POA: Diagnosis not present

## 2017-12-13 ENCOUNTER — Other Ambulatory Visit: Payer: Self-pay | Admitting: Endocrinology

## 2017-12-13 DIAGNOSIS — N183 Chronic kidney disease, stage 3 (moderate): Secondary | ICD-10-CM | POA: Diagnosis not present

## 2017-12-13 DIAGNOSIS — L97522 Non-pressure chronic ulcer of other part of left foot with fat layer exposed: Secondary | ICD-10-CM | POA: Diagnosis not present

## 2017-12-13 DIAGNOSIS — E11621 Type 2 diabetes mellitus with foot ulcer: Secondary | ICD-10-CM | POA: Diagnosis not present

## 2017-12-13 DIAGNOSIS — G894 Chronic pain syndrome: Secondary | ICD-10-CM | POA: Diagnosis not present

## 2017-12-13 DIAGNOSIS — I480 Paroxysmal atrial fibrillation: Secondary | ICD-10-CM | POA: Diagnosis not present

## 2017-12-13 DIAGNOSIS — E11311 Type 2 diabetes mellitus with unspecified diabetic retinopathy with macular edema: Secondary | ICD-10-CM | POA: Diagnosis not present

## 2017-12-13 DIAGNOSIS — E1142 Type 2 diabetes mellitus with diabetic polyneuropathy: Secondary | ICD-10-CM | POA: Diagnosis not present

## 2017-12-13 DIAGNOSIS — E1122 Type 2 diabetes mellitus with diabetic chronic kidney disease: Secondary | ICD-10-CM | POA: Diagnosis not present

## 2017-12-13 DIAGNOSIS — D631 Anemia in chronic kidney disease: Secondary | ICD-10-CM | POA: Diagnosis not present

## 2017-12-13 DIAGNOSIS — Z7901 Long term (current) use of anticoagulants: Secondary | ICD-10-CM | POA: Diagnosis not present

## 2017-12-13 DIAGNOSIS — I129 Hypertensive chronic kidney disease with stage 1 through stage 4 chronic kidney disease, or unspecified chronic kidney disease: Secondary | ICD-10-CM | POA: Diagnosis not present

## 2017-12-15 DIAGNOSIS — E11311 Type 2 diabetes mellitus with unspecified diabetic retinopathy with macular edema: Secondary | ICD-10-CM | POA: Diagnosis not present

## 2017-12-15 DIAGNOSIS — L97522 Non-pressure chronic ulcer of other part of left foot with fat layer exposed: Secondary | ICD-10-CM | POA: Diagnosis not present

## 2017-12-15 DIAGNOSIS — E1142 Type 2 diabetes mellitus with diabetic polyneuropathy: Secondary | ICD-10-CM | POA: Diagnosis not present

## 2017-12-15 DIAGNOSIS — I480 Paroxysmal atrial fibrillation: Secondary | ICD-10-CM | POA: Diagnosis not present

## 2017-12-15 DIAGNOSIS — G894 Chronic pain syndrome: Secondary | ICD-10-CM | POA: Diagnosis not present

## 2017-12-15 DIAGNOSIS — H04123 Dry eye syndrome of bilateral lacrimal glands: Secondary | ICD-10-CM | POA: Diagnosis not present

## 2017-12-15 DIAGNOSIS — D631 Anemia in chronic kidney disease: Secondary | ICD-10-CM | POA: Diagnosis not present

## 2017-12-15 DIAGNOSIS — N183 Chronic kidney disease, stage 3 (moderate): Secondary | ICD-10-CM | POA: Diagnosis not present

## 2017-12-15 DIAGNOSIS — E113311 Type 2 diabetes mellitus with moderate nonproliferative diabetic retinopathy with macular edema, right eye: Secondary | ICD-10-CM | POA: Diagnosis not present

## 2017-12-15 DIAGNOSIS — Z7901 Long term (current) use of anticoagulants: Secondary | ICD-10-CM | POA: Diagnosis not present

## 2017-12-15 DIAGNOSIS — E1122 Type 2 diabetes mellitus with diabetic chronic kidney disease: Secondary | ICD-10-CM | POA: Diagnosis not present

## 2017-12-15 DIAGNOSIS — I129 Hypertensive chronic kidney disease with stage 1 through stage 4 chronic kidney disease, or unspecified chronic kidney disease: Secondary | ICD-10-CM | POA: Diagnosis not present

## 2017-12-15 DIAGNOSIS — E11621 Type 2 diabetes mellitus with foot ulcer: Secondary | ICD-10-CM | POA: Diagnosis not present

## 2017-12-17 ENCOUNTER — Encounter (HOSPITAL_BASED_OUTPATIENT_CLINIC_OR_DEPARTMENT_OTHER): Payer: Medicare Other | Attending: Internal Medicine

## 2017-12-17 DIAGNOSIS — N186 End stage renal disease: Secondary | ICD-10-CM | POA: Diagnosis not present

## 2017-12-17 DIAGNOSIS — E1122 Type 2 diabetes mellitus with diabetic chronic kidney disease: Secondary | ICD-10-CM | POA: Insufficient documentation

## 2017-12-17 DIAGNOSIS — E1142 Type 2 diabetes mellitus with diabetic polyneuropathy: Secondary | ICD-10-CM | POA: Insufficient documentation

## 2017-12-17 DIAGNOSIS — L84 Corns and callosities: Secondary | ICD-10-CM | POA: Insufficient documentation

## 2017-12-17 DIAGNOSIS — G4730 Sleep apnea, unspecified: Secondary | ICD-10-CM | POA: Insufficient documentation

## 2017-12-17 DIAGNOSIS — L97522 Non-pressure chronic ulcer of other part of left foot with fat layer exposed: Secondary | ICD-10-CM | POA: Insufficient documentation

## 2017-12-17 DIAGNOSIS — E11621 Type 2 diabetes mellitus with foot ulcer: Secondary | ICD-10-CM | POA: Diagnosis not present

## 2017-12-20 ENCOUNTER — Other Ambulatory Visit: Payer: Self-pay | Admitting: Internal Medicine

## 2017-12-20 DIAGNOSIS — G4733 Obstructive sleep apnea (adult) (pediatric): Secondary | ICD-10-CM | POA: Diagnosis not present

## 2017-12-21 DIAGNOSIS — E11621 Type 2 diabetes mellitus with foot ulcer: Secondary | ICD-10-CM | POA: Diagnosis not present

## 2017-12-21 DIAGNOSIS — L97522 Non-pressure chronic ulcer of other part of left foot with fat layer exposed: Secondary | ICD-10-CM | POA: Diagnosis not present

## 2017-12-21 DIAGNOSIS — L84 Corns and callosities: Secondary | ICD-10-CM | POA: Diagnosis not present

## 2017-12-21 DIAGNOSIS — E1142 Type 2 diabetes mellitus with diabetic polyneuropathy: Secondary | ICD-10-CM | POA: Diagnosis not present

## 2017-12-21 DIAGNOSIS — N186 End stage renal disease: Secondary | ICD-10-CM | POA: Diagnosis not present

## 2017-12-21 DIAGNOSIS — G4730 Sleep apnea, unspecified: Secondary | ICD-10-CM | POA: Diagnosis not present

## 2017-12-21 DIAGNOSIS — E1122 Type 2 diabetes mellitus with diabetic chronic kidney disease: Secondary | ICD-10-CM | POA: Diagnosis not present

## 2017-12-22 DIAGNOSIS — H1013 Acute atopic conjunctivitis, bilateral: Secondary | ICD-10-CM | POA: Diagnosis not present

## 2017-12-24 DIAGNOSIS — Z79891 Long term (current) use of opiate analgesic: Secondary | ICD-10-CM | POA: Diagnosis not present

## 2017-12-24 DIAGNOSIS — M545 Low back pain: Secondary | ICD-10-CM | POA: Diagnosis not present

## 2017-12-24 DIAGNOSIS — M79672 Pain in left foot: Secondary | ICD-10-CM | POA: Diagnosis not present

## 2017-12-24 DIAGNOSIS — G894 Chronic pain syndrome: Secondary | ICD-10-CM | POA: Diagnosis not present

## 2017-12-28 DIAGNOSIS — E1142 Type 2 diabetes mellitus with diabetic polyneuropathy: Secondary | ICD-10-CM | POA: Diagnosis not present

## 2017-12-28 DIAGNOSIS — E1122 Type 2 diabetes mellitus with diabetic chronic kidney disease: Secondary | ICD-10-CM | POA: Diagnosis not present

## 2017-12-28 DIAGNOSIS — L97522 Non-pressure chronic ulcer of other part of left foot with fat layer exposed: Secondary | ICD-10-CM | POA: Diagnosis not present

## 2017-12-28 DIAGNOSIS — L84 Corns and callosities: Secondary | ICD-10-CM | POA: Diagnosis not present

## 2017-12-28 DIAGNOSIS — N186 End stage renal disease: Secondary | ICD-10-CM | POA: Diagnosis not present

## 2017-12-28 DIAGNOSIS — E11621 Type 2 diabetes mellitus with foot ulcer: Secondary | ICD-10-CM | POA: Diagnosis not present

## 2017-12-28 DIAGNOSIS — G4730 Sleep apnea, unspecified: Secondary | ICD-10-CM | POA: Diagnosis not present

## 2017-12-29 DIAGNOSIS — D234 Other benign neoplasm of skin of scalp and neck: Secondary | ICD-10-CM | POA: Diagnosis not present

## 2018-01-04 DIAGNOSIS — L97522 Non-pressure chronic ulcer of other part of left foot with fat layer exposed: Secondary | ICD-10-CM | POA: Diagnosis not present

## 2018-01-04 DIAGNOSIS — E1142 Type 2 diabetes mellitus with diabetic polyneuropathy: Secondary | ICD-10-CM | POA: Diagnosis not present

## 2018-01-04 DIAGNOSIS — G4730 Sleep apnea, unspecified: Secondary | ICD-10-CM | POA: Diagnosis not present

## 2018-01-04 DIAGNOSIS — N186 End stage renal disease: Secondary | ICD-10-CM | POA: Diagnosis not present

## 2018-01-04 DIAGNOSIS — E11621 Type 2 diabetes mellitus with foot ulcer: Secondary | ICD-10-CM | POA: Diagnosis not present

## 2018-01-04 DIAGNOSIS — L84 Corns and callosities: Secondary | ICD-10-CM | POA: Diagnosis not present

## 2018-01-04 DIAGNOSIS — E1122 Type 2 diabetes mellitus with diabetic chronic kidney disease: Secondary | ICD-10-CM | POA: Diagnosis not present

## 2018-01-06 ENCOUNTER — Other Ambulatory Visit: Payer: Self-pay | Admitting: Internal Medicine

## 2018-01-06 DIAGNOSIS — E1142 Type 2 diabetes mellitus with diabetic polyneuropathy: Secondary | ICD-10-CM

## 2018-01-07 ENCOUNTER — Other Ambulatory Visit: Payer: Self-pay | Admitting: Internal Medicine

## 2018-01-10 ENCOUNTER — Other Ambulatory Visit: Payer: Self-pay

## 2018-01-10 DIAGNOSIS — E1142 Type 2 diabetes mellitus with diabetic polyneuropathy: Secondary | ICD-10-CM

## 2018-01-10 NOTE — Telephone Encounter (Signed)
Patient requesting all meds sent to [facility] be resent to [facility]. States he is having trouble getting meds on time from [facility] and no longer wants to deal with them. [facility] removed from record. Hubbard Hartshorn, RN, BSN

## 2018-01-10 NOTE — Telephone Encounter (Signed)
Requesting to speak with a nurse about meds. Please call pt back.  

## 2018-01-11 ENCOUNTER — Encounter (HOSPITAL_BASED_OUTPATIENT_CLINIC_OR_DEPARTMENT_OTHER): Payer: Medicare Other | Attending: Internal Medicine

## 2018-01-11 DIAGNOSIS — G4730 Sleep apnea, unspecified: Secondary | ICD-10-CM | POA: Diagnosis not present

## 2018-01-11 DIAGNOSIS — B9562 Methicillin resistant Staphylococcus aureus infection as the cause of diseases classified elsewhere: Secondary | ICD-10-CM | POA: Insufficient documentation

## 2018-01-11 DIAGNOSIS — B9689 Other specified bacterial agents as the cause of diseases classified elsewhere: Secondary | ICD-10-CM | POA: Diagnosis not present

## 2018-01-11 DIAGNOSIS — B964 Proteus (mirabilis) (morganii) as the cause of diseases classified elsewhere: Secondary | ICD-10-CM | POA: Diagnosis not present

## 2018-01-11 DIAGNOSIS — E11621 Type 2 diabetes mellitus with foot ulcer: Secondary | ICD-10-CM | POA: Insufficient documentation

## 2018-01-11 DIAGNOSIS — E1122 Type 2 diabetes mellitus with diabetic chronic kidney disease: Secondary | ICD-10-CM | POA: Diagnosis not present

## 2018-01-11 DIAGNOSIS — I120 Hypertensive chronic kidney disease with stage 5 chronic kidney disease or end stage renal disease: Secondary | ICD-10-CM | POA: Insufficient documentation

## 2018-01-11 DIAGNOSIS — N186 End stage renal disease: Secondary | ICD-10-CM | POA: Diagnosis not present

## 2018-01-11 DIAGNOSIS — G4733 Obstructive sleep apnea (adult) (pediatric): Secondary | ICD-10-CM | POA: Diagnosis not present

## 2018-01-11 DIAGNOSIS — E1142 Type 2 diabetes mellitus with diabetic polyneuropathy: Secondary | ICD-10-CM | POA: Insufficient documentation

## 2018-01-11 DIAGNOSIS — L97522 Non-pressure chronic ulcer of other part of left foot with fat layer exposed: Secondary | ICD-10-CM | POA: Diagnosis not present

## 2018-01-11 MED ORDER — ATORVASTATIN CALCIUM 20 MG PO TABS: ORAL_TABLET | ORAL | 1 refills | Status: DC

## 2018-01-11 MED ORDER — GABAPENTIN 300 MG PO CAPS: ORAL_CAPSULE | ORAL | 3 refills | Status: DC

## 2018-01-11 MED ORDER — PROMETHAZINE HCL 25 MG PO TABS: ORAL_TABLET | ORAL | 0 refills | Status: DC

## 2018-01-18 ENCOUNTER — Other Ambulatory Visit: Payer: Self-pay | Admitting: Endocrinology

## 2018-01-18 DIAGNOSIS — G4730 Sleep apnea, unspecified: Secondary | ICD-10-CM | POA: Diagnosis not present

## 2018-01-18 DIAGNOSIS — L97522 Non-pressure chronic ulcer of other part of left foot with fat layer exposed: Secondary | ICD-10-CM | POA: Diagnosis not present

## 2018-01-18 DIAGNOSIS — N186 End stage renal disease: Secondary | ICD-10-CM | POA: Diagnosis not present

## 2018-01-18 DIAGNOSIS — E11621 Type 2 diabetes mellitus with foot ulcer: Secondary | ICD-10-CM | POA: Diagnosis not present

## 2018-01-18 DIAGNOSIS — E1122 Type 2 diabetes mellitus with diabetic chronic kidney disease: Secondary | ICD-10-CM | POA: Diagnosis not present

## 2018-01-18 DIAGNOSIS — E1142 Type 2 diabetes mellitus with diabetic polyneuropathy: Secondary | ICD-10-CM | POA: Diagnosis not present

## 2018-01-18 DIAGNOSIS — B9562 Methicillin resistant Staphylococcus aureus infection as the cause of diseases classified elsewhere: Secondary | ICD-10-CM | POA: Diagnosis not present

## 2018-01-18 DIAGNOSIS — I120 Hypertensive chronic kidney disease with stage 5 chronic kidney disease or end stage renal disease: Secondary | ICD-10-CM | POA: Diagnosis not present

## 2018-01-18 DIAGNOSIS — B9689 Other specified bacterial agents as the cause of diseases classified elsewhere: Secondary | ICD-10-CM | POA: Diagnosis not present

## 2018-01-18 DIAGNOSIS — B964 Proteus (mirabilis) (morganii) as the cause of diseases classified elsewhere: Secondary | ICD-10-CM | POA: Diagnosis not present

## 2018-01-19 ENCOUNTER — Other Ambulatory Visit: Payer: Self-pay | Admitting: Endocrinology

## 2018-01-19 DIAGNOSIS — E113311 Type 2 diabetes mellitus with moderate nonproliferative diabetic retinopathy with macular edema, right eye: Secondary | ICD-10-CM | POA: Diagnosis not present

## 2018-01-25 DIAGNOSIS — B9562 Methicillin resistant Staphylococcus aureus infection as the cause of diseases classified elsewhere: Secondary | ICD-10-CM | POA: Diagnosis not present

## 2018-01-25 DIAGNOSIS — E1122 Type 2 diabetes mellitus with diabetic chronic kidney disease: Secondary | ICD-10-CM | POA: Diagnosis not present

## 2018-01-25 DIAGNOSIS — B964 Proteus (mirabilis) (morganii) as the cause of diseases classified elsewhere: Secondary | ICD-10-CM | POA: Diagnosis not present

## 2018-01-25 DIAGNOSIS — E11621 Type 2 diabetes mellitus with foot ulcer: Secondary | ICD-10-CM | POA: Diagnosis not present

## 2018-01-25 DIAGNOSIS — I120 Hypertensive chronic kidney disease with stage 5 chronic kidney disease or end stage renal disease: Secondary | ICD-10-CM | POA: Diagnosis not present

## 2018-01-25 DIAGNOSIS — G4730 Sleep apnea, unspecified: Secondary | ICD-10-CM | POA: Diagnosis not present

## 2018-01-25 DIAGNOSIS — E1142 Type 2 diabetes mellitus with diabetic polyneuropathy: Secondary | ICD-10-CM | POA: Diagnosis not present

## 2018-01-25 DIAGNOSIS — B9689 Other specified bacterial agents as the cause of diseases classified elsewhere: Secondary | ICD-10-CM | POA: Diagnosis not present

## 2018-01-25 DIAGNOSIS — N186 End stage renal disease: Secondary | ICD-10-CM | POA: Diagnosis not present

## 2018-01-25 DIAGNOSIS — L97522 Non-pressure chronic ulcer of other part of left foot with fat layer exposed: Secondary | ICD-10-CM | POA: Diagnosis not present

## 2018-01-26 DIAGNOSIS — E113512 Type 2 diabetes mellitus with proliferative diabetic retinopathy with macular edema, left eye: Secondary | ICD-10-CM | POA: Diagnosis not present

## 2018-01-27 DIAGNOSIS — G894 Chronic pain syndrome: Secondary | ICD-10-CM | POA: Diagnosis not present

## 2018-01-27 DIAGNOSIS — Z79891 Long term (current) use of opiate analgesic: Secondary | ICD-10-CM | POA: Diagnosis not present

## 2018-01-27 DIAGNOSIS — M545 Low back pain: Secondary | ICD-10-CM | POA: Diagnosis not present

## 2018-01-27 DIAGNOSIS — M79672 Pain in left foot: Secondary | ICD-10-CM | POA: Diagnosis not present

## 2018-01-28 DIAGNOSIS — L97521 Non-pressure chronic ulcer of other part of left foot limited to breakdown of skin: Secondary | ICD-10-CM | POA: Diagnosis not present

## 2018-01-28 DIAGNOSIS — E119 Type 2 diabetes mellitus without complications: Secondary | ICD-10-CM | POA: Diagnosis not present

## 2018-01-28 DIAGNOSIS — E11621 Type 2 diabetes mellitus with foot ulcer: Secondary | ICD-10-CM | POA: Diagnosis not present

## 2018-01-28 DIAGNOSIS — Z794 Long term (current) use of insulin: Secondary | ICD-10-CM | POA: Diagnosis not present

## 2018-02-01 ENCOUNTER — Other Ambulatory Visit (HOSPITAL_COMMUNITY)
Admission: RE | Admit: 2018-02-01 | Discharge: 2018-02-01 | Disposition: A | Discharge status: Home / Self Care | Payer: Medicare Other | Source: Other Acute Inpatient Hospital | Attending: Internal Medicine | Admitting: Internal Medicine

## 2018-02-01 DIAGNOSIS — E1122 Type 2 diabetes mellitus with diabetic chronic kidney disease: Secondary | ICD-10-CM | POA: Diagnosis not present

## 2018-02-01 DIAGNOSIS — I120 Hypertensive chronic kidney disease with stage 5 chronic kidney disease or end stage renal disease: Secondary | ICD-10-CM | POA: Diagnosis not present

## 2018-02-01 DIAGNOSIS — E11621 Type 2 diabetes mellitus with foot ulcer: Secondary | ICD-10-CM | POA: Diagnosis not present

## 2018-02-01 DIAGNOSIS — L97522 Non-pressure chronic ulcer of other part of left foot with fat layer exposed: Secondary | ICD-10-CM | POA: Diagnosis not present

## 2018-02-01 DIAGNOSIS — B964 Proteus (mirabilis) (morganii) as the cause of diseases classified elsewhere: Secondary | ICD-10-CM | POA: Diagnosis not present

## 2018-02-01 DIAGNOSIS — G4730 Sleep apnea, unspecified: Secondary | ICD-10-CM | POA: Diagnosis not present

## 2018-02-01 DIAGNOSIS — B9689 Other specified bacterial agents as the cause of diseases classified elsewhere: Secondary | ICD-10-CM | POA: Diagnosis not present

## 2018-02-01 DIAGNOSIS — N186 End stage renal disease: Secondary | ICD-10-CM | POA: Diagnosis not present

## 2018-02-01 DIAGNOSIS — E1142 Type 2 diabetes mellitus with diabetic polyneuropathy: Secondary | ICD-10-CM | POA: Diagnosis not present

## 2018-02-01 DIAGNOSIS — B9562 Methicillin resistant Staphylococcus aureus infection as the cause of diseases classified elsewhere: Secondary | ICD-10-CM | POA: Diagnosis not present

## 2018-02-01 DIAGNOSIS — L97523 Non-pressure chronic ulcer of other part of left foot with necrosis of muscle: Secondary | ICD-10-CM | POA: Diagnosis not present

## 2018-02-03 ENCOUNTER — Encounter: Payer: Self-pay | Admitting: Dietician

## 2018-02-03 ENCOUNTER — Other Ambulatory Visit (INDEPENDENT_AMBULATORY_CARE_PROVIDER_SITE_OTHER): Payer: Medicare Other

## 2018-02-03 ENCOUNTER — Encounter: Payer: Medicare Other | Attending: Endocrinology | Admitting: Dietician

## 2018-02-03 DIAGNOSIS — Z6841 Body Mass Index (BMI) 40.0 and over, adult: Secondary | ICD-10-CM | POA: Diagnosis not present

## 2018-02-03 DIAGNOSIS — Z794 Long term (current) use of insulin: Secondary | ICD-10-CM

## 2018-02-03 DIAGNOSIS — E1165 Type 2 diabetes mellitus with hyperglycemia: Secondary | ICD-10-CM | POA: Diagnosis not present

## 2018-02-03 DIAGNOSIS — E11649 Type 2 diabetes mellitus with hypoglycemia without coma: Secondary | ICD-10-CM

## 2018-02-03 DIAGNOSIS — Z713 Dietary counseling and surveillance: Secondary | ICD-10-CM | POA: Diagnosis not present

## 2018-02-03 LAB — HEMOGLOBIN A1C: HEMOGLOBIN A1C: 8 % — AB (ref 4.6–6.5)

## 2018-02-03 LAB — GLUCOSE, RANDOM: GLUCOSE: 162 mg/dL — AB (ref 70–99)

## 2018-02-03 NOTE — Progress Notes (Signed)
Diabetes Self-Management Education  Visit Type: Follow-up  Appt. Start Time: 0910 Appt. End Time: 5397  02/03/2018  [name], identified by name and date of birth, is a 67 y.o. male with a diagnosis of Diabetes:  Type 2   .  Patient of Dr. Dwyane Dee.  He is also seen at the [facility] and has seen Butch Penny, [place], CDE as well as followed by myself and Vaughan Basta CDE at this office.   History includes CKD, HTN, OSA on C-pap, hyperlipidemia, and GERD.  He has had a couple of toes amputated and is followed at the wound clinic.  He is currently in a cast. He uses a V-Go 20 with Humulin U-500 and he will guess the number of clicks to use for meals as well as correction.  He forgets correction dose and meal clicks at times. He uses the [place] and showed how his blood sugar rises after cereal meals as well as higher fat meals.  He knocks the sensor off at times.  Provided tips on how to correct this. He is also taking Victoza.  Labs 11/30/17:  BUN 25, Creatinine 1.64, GFR 49, Potassium 4.9, Vitamin D 32.7 (he is taking a supplement)  Weight hx: 403 lbs today but wearing a fiberglass cast on his foot 402 lbs lbs 07/2017 393 lbs 06/2015 He does retain fluid and is not currently taking Lasix  Patient lives alone.  He is married but they are not together.  He does his own shopping and cooking.  He relies on [employer] for transportation.    ASSESSMENT  Weight (!) 403 lb (182.8 kg). Body mass index is 49.05 kg/m.  Diabetes Self-Management Education - 02/03/18 1508      Visit Information   Visit Type  Follow-up      Psychosocial Assessment   Self-management support  Doctor's office    Other persons present  Patient    Patient Concerns  Nutrition/Meal planning;Glycemic Control;Weight Control;Problem Solving    Special Needs  None    Learning Readiness  Ready    How often do you need to have someone help you when you read instructions, pamphlets, or other written materials from your  doctor or pharmacy?  1 - Never      Pre-Education Assessment   Patient understands the diabetes disease and treatment process.  Demonstrates understanding / competency    Patient understands incorporating nutritional management into lifestyle.  Needs Review    Patient undertands incorporating physical activity into lifestyle.  Demonstrates understanding / competency    Patient understands using medications safely.  Demonstrates understanding / competency    Patient understands monitoring blood glucose, interpreting and using results  Demonstrates understanding / competency    Patient understands prevention, detection, and treatment of acute complications.  Demonstrates understanding / competency    Patient understands prevention, detection, and treatment of chronic complications.  Needs Review    Patient understands how to develop strategies to address psychosocial issues.  Needs Review      Complications   Last HgB A1C per patient/outside source  8 % today and decreased from 8.3% 11/05/17    How often do you check your blood sugar?  > 4 times/day Free Style Libre    Fasting Blood glucose range (mg/dL)  70-129;130-179    Postprandial Blood glucose range (mg/dL)  180-200;>200;130-179      Dietary Intake   Breakfast  Cereal (honey nut cheerios), milk, banana, strawberries OR nothing until later, OJ occasionally with meds or  low    Lunch  pimento cheese sandwich or tomato sandwich- [employer] [place]:  3 chicken legs, broccoli, cabbage, lima beans 11:30-1    Snack (afternoon)  trail mix    Dinner  bologna and tomato sandwich, veggie chips    Snack (evening)  NABS    Beverage(s)  water, V-8, diet tea, zero gatorade, diet Mt. Dew, seldom coffee with creamer and spenda      Exercise   Exercise Type  ADL's      Patient Education   Previous Diabetes Education  Yes (please comment) January 2019    Nutrition management   Role of diet in the treatment of diabetes and the relationship between  the three main macronutrients and blood glucose level;Meal options for control of blood glucose level and chronic complications.;Information on hints to eating out and maintain blood glucose control.;Food label reading, portion sizes and measuring food.;Carbohydrate counting    Medications  Reviewed patients medication for diabetes, action, purpose, timing of dose and side effects.    Monitoring  Other (comment) Reviewed Libre    Acute complications  Other (comment) reviewed treatment of hypoglycemia      Individualized Goals (developed by patient)   Nutrition  General guidelines for healthy choices and portions discussed    Physical Activity  Not Applicable    Medications  take my medication as prescribed    Monitoring   test my blood glucose as discussed    Problem Solving  amount of insulin    Reducing Risk  examine blood glucose patterns;increase portions of healthy fats    Health Coping  discuss diabetes with (comment) MD, RD, CDE      Patient Self-Evaluation of Goals - Patient rates self as meeting previously set goals (% of time)   Nutrition  50 - 75 %    Physical Activity  Not Applicable    Medications  50 - 75 %    Monitoring  >75%    Problem Solving  50 - 75 %    Reducing Risk  50 - 75 %    Health Coping  50 - 75 %      Post-Education Assessment   Patient understands the diabetes disease and treatment process.  Demonstrates understanding / competency    Patient understands incorporating nutritional management into lifestyle.  Needs Review    Patient undertands incorporating physical activity into lifestyle.  Demonstrates understanding / competency    Patient understands using medications safely.  Demonstrates understanding / competency    Patient understands monitoring blood glucose, interpreting and using results  Demonstrates understanding / competency    Patient understands prevention, detection, and treatment of acute complications.  Demonstrates understanding / competency     Patient understands prevention, detection, and treatment of chronic complications.  Demonstrates understanding / competency    Patient understands how to develop strategies to address psychosocial issues.  Demonstrates understanding / competency    Patient understands how to develop strategies to promote health/change behavior.  Needs Review      Outcomes   Expected Outcomes  Demonstrated interest in learning. Expect positive outcomes    Future DMSE  4-6 wks    Program Status  Not Completed      Subsequent Visit   Since your last visit have you continued or begun to take your medications as prescribed?  Yes    Since your last visit have you experienced any weight changes?  No change       Individualized Plan for Diabetes  Self-Management Training:   Learning Objective:  Patient will have a greater understanding of diabetes self-management. Patient education plan is to attend individual and/or group sessions per assessed needs and concerns.   Plan:   Patient Instructions  To help the FreeStyle Libre stick:  Consider changing the location to the inside of the arm. [employer]     Rockadex     Skin Tac Or Nexcare Waterproof band aides  Or a colored wrap      Click the V-go before you eat Add 1-2 clicks for a high fat or large meal  Avoid "Lite Salt" and "No Salt" named products Eat more non starchy vegetables  Choose other option for cereal most often Small amount of protein with each meal. Read labels for portion size and total carbohydrate. Consider sugar free jello or sugar free popsickles Consider raw vegetables for a snack with hummus Consider a small portion of nuts for a snack Consider your own peanut butter crackers rather than NABS   Expected Outcomes:  Demonstrated interest in learning. Expect positive outcomes  Education material provided: Food label handouts, Meal plan card and Snack sheet  If problems or questions, patient to contact team via:   Phone  Future DSME appointment: 4-6 wks

## 2018-02-03 NOTE — Patient Instructions (Addendum)
To help the FreeStyle Libre stick:  Consider changing the location to the inside of the arm. [employer]     Rockadex     Skin Tac Or Nexcare Waterproof band aides  Or a colored wrap      Click the V-go before you eat Add 1-2 clicks for a high fat or large meal  Avoid "Lite Salt" and "No Salt" named products Eat more non starchy vegetables  Choose other option for cereal most often Small amount of protein with each meal. Read labels for portion size and total carbohydrate. Consider sugar free jello or sugar free popsickles Consider raw vegetables for a snack with hummus Consider a small portion of nuts for a snack Consider your own peanut butter crackers rather than NABS

## 2018-02-05 LAB — AEROBIC CULTURE  (SUPERFICIAL SPECIMEN)

## 2018-02-05 LAB — AEROBIC CULTURE W GRAM STAIN (SUPERFICIAL SPECIMEN)

## 2018-02-06 NOTE — Progress Notes (Signed)
Patient ID: [name], male   DOB: 01-23-1951, 67 y.o.   MRN: [id number]           Reason for Appointment: Follow-up for Type 2 Diabetes  Referring physician: Dareen Piano  History of Present Illness:          Date of diagnosis of type 2 diabetes mellitus: 2007       Background history:   He was diagnosed to have diabetes when he had an ulcer on his left third toe which led to amputation.  His glucose was apparently about 400 and he was started on insulin at that time He does not know if he has taken any oral hypoglycemic drugs in the past His A1c in 2016 has been consistently over 8% On his initial consultation was started on Victoza and subsequently started on U-500 insulin in 2/17 along with Toujeo INSULIN regimen previously was:  TOUJEO  74 in the morning and 74 in the evening HUMULIN U-500 insulin: 20/40 units in the morning, 60/70 at lunch and 70 at supper  Recent history:   Since 8/18 has been using V-go PUMP 20 unit basal, Humulin R U-500 insulin with boluses 4 units breakfast, 10 units acl  12 units Changes his pump at 8 am  Non-insulin hypoglycemic drugs the patient is taking are: Victoza 1.8 mg daily        His A1c is now 8, previously 8.5  Current blood sugar patterns and problems identified:  HIGHEST blood sugars are again in the evenings after supper with most blood sugars rising after about 4 PM  However in the evenings he has quite variable mealtimes and food intake  He is trying better to cut back on high fat foods and has seen the dietitian again recently  He does not always adjust his BOLUSES based on how much he is eating  And despite reminders he is not able to consistently take boluses 30 minutes before he is eating a meal  His breakfast is extremely variable and sometimes none but does not have significant hypoglycemia in the mornings or after lunch  He has been told by the dietitian to cut back on eating cereal improved in the morning  Has  had minimal hypoglycemia, once probably from taking insulin bolus after eating when the blood sugar had gone up in the evening  Only once had overnight hypoglycemia with blood sugar going down in the 50s  FASTING blood sugars are also variable depending on blood sugar tonight before but usually not averaging more than 135  He does start to monitor his blood sugar several times throughout the day using his Freestyle Libre now   Glucose monitoring:  done 3-4  times a day         Glucometer:  freestyle libre currently     Blood Glucose readings by download:   CGM use % of time  98  Average and SD  161+/-46  Time in range      68 %  % Time Above 180  31  % Time above 250  3  % Time Below target  1       PRE-MEAL Fasting Lunch Dinner Bedtime Overall  Glucose range:       Mean/median:  134  169  162  168 161   POST-MEAL PC Breakfast PC Lunch PC Dinner  Glucose range:     Mean/median:  169  162  195     PREVIOUS readings  PRE-MEAL Fasting Lunch Dinner  Bedtime Overall  Glucose range:       Mean/median:  136  168  148  189 164   POST-MEAL PC Breakfast PC Lunch PC Dinner  Glucose range:     Mean/median:   193  206      Self-care:  Typical meal intake: Breakfast is sometimes fruit or cereal, Sometimes eating sausage and grits Has been trying to avoid high-fat foods or fried food but not daily  Breakfast 8 AM Lunch 12 noon Dinner at 5-6 pm He drinks cranberry or V-8 juice and diet drinks                Dietician consultation: 1/19               Exercise: not walking,  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 Glucose, Bld 02/03/2018 162* 70 - 99 mg/dL Final  . Hgb A1c MFr Bld 02/03/2018 8.0* 4.6 - 6.5 % Final   Glycemic Control Guidelines for People with Diabetes:Non Diabetic:  <6%Goal of Therapy: <7%Additional Action Suggested:  >8%   Hospital Outpatient Visit on 02/01/2018  Component Date Value Ref Range Status  . Specimen Description 02/01/2018    Final                   Value:WOUND LEFT PLANTAR FOOT Performed at [facility], [place] [address], [place], [place] [address]   . Special Requests 02/01/2018    Final                   Value:NONE Performed at [facility], [place] [address] [place], [place] [address]   . Gram Stain 02/01/2018    Final                   Value:FEW WBC PRESENT, PREDOMINANTLY PMN FEW GRAM POSITIVE COCCI RARE GRAM NEGATIVE RODS RARE GRAM POSITIVE RODS Performed at [facility], [place] [address], [place], [place] [address]   . Culture 02/01/2018    Final                   Value:ABUNDANT GROUP B STREP(S.AGALACTIAE)ISOLATED TESTING AGAINST S. AGALACTIAE NOT ROUTINELY PERFORMED DUE TO PREDICTABILITY OF AMP/PEN/VAN SUSCEPTIBILITY. ABUNDANT PROTEUS MIRABILIS MODERATE ENTEROBACTER CLOACAE MODERATE METHICILLIN RESISTANT STAPHYLOCOCCUS AUREUS   . Report Status 02/01/2018 02/05/2018 FINAL   Final  . Organism ID, Bacteria 02/01/2018 PROTEUS MIRABILIS   Final  . Organism ID, Bacteria 02/01/2018 ENTEROBACTER CLOACAE   Final  . Organism ID, Bacteria 02/01/2018 METHICILLIN RESISTANT STAPHYLOCOCCUS AUREUS   Final       Allergies as of 02/07/2018      Reactions   Vancomycin    REACTION: ARF      Medication List        Accurate as  of 02/07/18 10:19 AM. Always use your most recent med list.          ACCU-CHEK AVIVA  PLUS test strip Generic drug:  glucose blood USE TO CHECK BLOOD FIVE TIMES DAILY   ACCU-CHEK AVIVA PLUS w/Device Kit CHECK BLOOD SUGAR 5 TIMES A DAY   ACCU-CHEK FASTCLIX LANCETS Misc Check blood sugar 5 times a day   albuterol 108 (90 Base) MCG/ACT inhaler Commonly known as:  PROAIR HFA Inhale 2 puffs into the lungs every 6 (six) hours as needed for wheezing or shortness of breath.   atorvastatin 20 MG tablet Commonly known as:  LIPITOR TAKE 1 TABLET(20 MG) BY MOUTH DAILY   becaplermin 0.01 % gel Commonly known as:  REGRANEX Apply 1 application topically daily.   diclofenac sodium 1 % Gel Commonly known as:  VOLTAREN Apply 4 g topically 4 (four) times daily.   docusate sodium 100 MG capsule Commonly known as:  COLACE Take 1 capsule (100 mg total) by mouth daily as needed for mild constipation.   ELIQUIS 5 MG Tabs tablet Generic drug:  apixaban TAKE 1 TABLET(5 MG) BY MOUTH TWICE DAILY   enalapril 20 MG tablet Commonly known as:  VASOTEC TAKE 2 TABLETS(40 MG) BY MOUTH DAILY   FREESTYLE LIBRE 14 DAY SENSOR Misc 1 each by Does not apply route 6 (six) times daily.   furosemide 40 MG tablet Commonly known as:  LASIX TAKE 1 TABLET(40 MG) BY MOUTH TWICE DAILY   gabapentin 300 MG capsule Commonly known as:  NEURONTIN TAKE 1 CAPSULE(300 MG) BY MOUTH AT BEDTIME   HUMULIN R U-500 KWIKPEN 500 UNIT/ML kwikpen Generic drug:  insulin regular human CONCENTRATED INJECT 35 UNITS AT BREAKFAST, 60 UNITS AT LUNCH AND 70 UNITS AT DINNER   HUMULIN R 500 UNIT/ML injection Generic drug:  insulin regular human CONCENTRATED DRAW INSULIN TO THE 68 UNIT LINE ON THE U-100 SYRINGE(TO USE 340 UNITS) PER DAY AS DIRECTED   HUMULIN R 500 UNIT/ML injection Generic drug:  insulin regular human CONCENTRATED DRAW INSULIN TO THE 68 UNIT LINE ON THE U-100 SYRINGE(TO USE 340 UNITS) PER DAY AS DIRECTED   HYDROcodone-acetaminophen 7.5-325 MG tablet Commonly known as:  NORCO Take 1 tablet by mouth  every 6 (six) hours as needed for moderate pain.   Insulin Pen Needle 31G X 5 MM Misc Use for insulin injection 5 times a day.   Insulin Syringe-Needle U-100 25G X 1" 1 ML Misc Use 1 syringe daily to fill V-Go pump   INSULIN SYRINGE 1CC/29G 29G X 1/2" 1 ML Misc USE DAILY AS DIRECTED   metoprolol succinate 25 MG 24 hr tablet Commonly known as:  TOPROL-XL TAKE 1 TABLET(25 MG) BY MOUTH DAILY   promethazine 25 MG tablet Commonly known as:  PHENERGAN TAKE 1 TABLET BY MOUTH  EVERY 8 HOURS AS NEEDED FOR NAUSEA OR VOMITING   silver sulfADIAZINE 1 % cream Commonly known as:  SILVADENE Apply 1 application topically daily.   SIMBRINZA 1-0.2 % Susp Generic drug:  Brinzolamide-Brimonidine Place 1 drop into both eyes 2 (two) times daily. Take as instructed by Dr. Katy Fitch.   V-GO 20 Kit USE 1 PUMP DAILY WITH INSULIN   VICTOZA 18 MG/3ML Sopn Generic drug:  liraglutide INJECT 1.8 MG ONCE DAILY AT THE SAME TIME   Vitamin D3 2000 units capsule Take 1 capsule (2,000 Units total) by mouth daily.       Allergies:  Allergies  Allergen Reactions  . Vancomycin     REACTION: ARF  Past Medical History:  Diagnosis Date  . Arthritis    "elbows & knees" (12/13/2014)  . Asthma   . CKD (chronic kidney disease), stage III ([place])   . DIABETIC FOOT ULCER 06/20/2009  . Edema, macular, due to secondary diabetes ([place])   . Erectile dysfunction   . GERD (gastroesophageal reflux disease)   . HEARING LOSS, SENSORINEURAL, BILATERAL 01/03/2007   Seen by ENT Dr. Orpah Greek D. Redmond Baseman 01/03/07  . Hemorrhoids   . History of echocardiogram    a. 04/2008 Echo: EF 50-55%, abnl LV relaxation, mildly dil LA.  Marland Kitchen Hyperlipidemia   . Hypertension   . Morbid obesity ([place])   . Neuropathy, lower extremity   . OSA (obstructive sleep apnea)    uses CPAP nightly  . OSA on CPAP    Nocturnal polysomnogram on 01/21/2010 showed severe obstructive sleep apnea/hypopnea syndrome, AHI 74.1 per hour with non positional events,  moderately loud snoring, and oxygen desaturation to a nadir of 78% on room air.  CPAP was successfully titrated to 17 CWP, AHI 1.1 per hour using a large ResMed Mirage Quattro full-face mask with heated humidifier. Bruxism was noted.   . Osteomyelitis of ankle and foot ([place])   . Retinopathy   . Type II diabetes mellitus ([facility])    w/complication NOS, type II    Past Surgical History:  Procedure Laterality Date  . AMPUTATION Left 12/15/2014   Procedure: LEFT SECOND TOE AMPUTATION ;  Surgeon: Dorna Leitz, MD;  Location: [facility];  Service: Orthopedics;  Laterality: Left;  . 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 Kathalene Frames. Mayer Camel.  . WOUND DEBRIDEMENT Left 06/15/2016   Procedure: DEBRIDEMENT WOUND LEFT FOOT WITH GRAFT APPLICATION;  Surgeon: Landis Martins, DPM;  Location: [facility];  Service: Podiatry;  Laterality: Left;    Family History  Problem Relation Age of Onset  . Diabetes Mother        also 2 siblings  . Heart attack Father 55  . Throat cancer Brother     Social History:  reports that he quit smoking about 7 years ago. His smoking use included cigarettes. He has a 15.00 pack-year smoking history. He has never used smokeless tobacco. He reports that he drinks about 6.6 oz of alcohol per week. He reports that he does not use drugs.    Review of Systems     Lipid management: taking Lipitor 20 mg, followed by PCP Also has low HDL    Lab Results  Component Value Date   CHOL 116 05/20/2017   HDL 30.80 (L) 05/20/2017   LDLCALC 55 05/20/2017   TRIG 155.0 (H) 05/20/2017   CHOLHDL 4 05/20/2017            Hypertension: Has had hypertension for a few years treated with enalapril,  taking 40 mg  Also on Lasix and 25 mg metoprolol This is treated by PCP/cardiologist  He  has chronic renal dysfunction, stable  Lab Results  Component Value Date   CREATININE 1.64 (H) 11/30/2017    CREATININE 1.66 (H) 07/22/2017   CREATININE 1.74 (H) 05/20/2017     Last foot exam done by podiatrist in 07/2017, currently wearing a contact cast from wound center for his chronic ulcer on the left foot   Physical Examination:  BP 130/68 (BP Location: Left Arm, Patient Position: Sitting, Cuff Size: Large)   Pulse 68   Ht '6\' 4"'$  (1.93 m)  Wt (!) 406 lb (184.2 kg)   SpO2 98%   BMI 49.42 kg/m      ASSESSMENT:  Diabetes type 2, with morbid obesity   See history of present illness for detailed discussion of current diabetes management, blood sugar patterns and problems identified  With V-go pump and using U-500 insulin his blood sugars are overall better controlled this year  However A1c is still 8% although gradually improving  As  discussed above he is not having consistent postprandial control especially at suppertime Although he thinks he is doing better with his compliance with bolusing before eating his reading depend on the type of meal he is having and he is not adjusting his bolus for that Blood sugars are usually not high after breakfast or lunch when he is eating lighter meals  Overall doing better with U-500 insulin and the insulin requirement and still less than with multiple injections Has only one low blood sugar overnight indicating proper amount of basal insulin  Again has difficulty with weight loss because of various factors and inactivity   Patient's blood sugar patterns secondary to diet and the variability was discussed, discussed timing of boluses, bolus adjustment for meal size and need for weight loss and better A1c  LIPIDS: Needs follow-up assessment and we will order labs  PLAN:     Will need to try and bolus consistently before each meal  Discussed day-to-day management of his diabetes regarding mealtime boluses, timing of the insulin bolus, adjustment based on meal size and carbohydrate intake and consistent diet  He will try to not bolus full  amount of insulin if he is late for his meal especially in the evening to prevent overnight hypoglycemia  He will call if he has consistently high readings fasting or tendency to hypoglycemia more often  Discussed blood sugar targets after meals  Balanced meals especially at breakfast with some protein  If he has any higher fat intake or alcohol he needs to take an extra 1-2 clicks  Reminded him to take a bolus coverage for the food even if he is not able to check his sugar before eating   Patient Instructions  Try to click 30 min before meals  Click always even if sugar not checked   Counseling time on subjects discussed in assessment and plan sections is over 50% of today's 25 minute visit   Elayne Snare 02/07/2018, 10:19 AM   Note: This office note was prepared with Dragon voice recognition system technology. Any transcriptional errors that result from this process are unintentional.

## 2018-02-07 ENCOUNTER — Encounter: Payer: Self-pay | Admitting: Endocrinology

## 2018-02-07 ENCOUNTER — Ambulatory Visit (INDEPENDENT_AMBULATORY_CARE_PROVIDER_SITE_OTHER): Payer: Medicare Other | Admitting: Endocrinology

## 2018-02-07 VITALS — BP 130/68 | HR 68 | Ht 76.0 in | Wt >= 6400 oz

## 2018-02-07 DIAGNOSIS — E1165 Type 2 diabetes mellitus with hyperglycemia: Secondary | ICD-10-CM

## 2018-02-07 DIAGNOSIS — Z794 Long term (current) use of insulin: Secondary | ICD-10-CM | POA: Diagnosis not present

## 2018-02-07 DIAGNOSIS — E7800 Pure hypercholesterolemia, unspecified: Secondary | ICD-10-CM

## 2018-02-07 NOTE — Patient Instructions (Signed)
Try to click 30 min before meals  Click always even if sugar not checked

## 2018-02-08 DIAGNOSIS — L97522 Non-pressure chronic ulcer of other part of left foot with fat layer exposed: Secondary | ICD-10-CM | POA: Diagnosis not present

## 2018-02-08 DIAGNOSIS — S91302A Unspecified open wound, left foot, initial encounter: Secondary | ICD-10-CM | POA: Diagnosis not present

## 2018-02-08 DIAGNOSIS — B9689 Other specified bacterial agents as the cause of diseases classified elsewhere: Secondary | ICD-10-CM | POA: Diagnosis not present

## 2018-02-08 DIAGNOSIS — B964 Proteus (mirabilis) (morganii) as the cause of diseases classified elsewhere: Secondary | ICD-10-CM | POA: Diagnosis not present

## 2018-02-08 DIAGNOSIS — E11621 Type 2 diabetes mellitus with foot ulcer: Secondary | ICD-10-CM | POA: Diagnosis not present

## 2018-02-08 DIAGNOSIS — E1122 Type 2 diabetes mellitus with diabetic chronic kidney disease: Secondary | ICD-10-CM | POA: Diagnosis not present

## 2018-02-08 DIAGNOSIS — N186 End stage renal disease: Secondary | ICD-10-CM | POA: Diagnosis not present

## 2018-02-08 DIAGNOSIS — G4730 Sleep apnea, unspecified: Secondary | ICD-10-CM | POA: Diagnosis not present

## 2018-02-08 DIAGNOSIS — B9562 Methicillin resistant Staphylococcus aureus infection as the cause of diseases classified elsewhere: Secondary | ICD-10-CM | POA: Diagnosis not present

## 2018-02-08 DIAGNOSIS — E1142 Type 2 diabetes mellitus with diabetic polyneuropathy: Secondary | ICD-10-CM | POA: Diagnosis not present

## 2018-02-08 DIAGNOSIS — I120 Hypertensive chronic kidney disease with stage 5 chronic kidney disease or end stage renal disease: Secondary | ICD-10-CM | POA: Diagnosis not present

## 2018-02-13 ENCOUNTER — Other Ambulatory Visit: Payer: Self-pay | Admitting: Endocrinology

## 2018-02-15 ENCOUNTER — Other Ambulatory Visit: Payer: Self-pay | Admitting: Endocrinology

## 2018-02-18 ENCOUNTER — Encounter (HOSPITAL_BASED_OUTPATIENT_CLINIC_OR_DEPARTMENT_OTHER): Payer: Medicare Other | Attending: Internal Medicine

## 2018-02-18 DIAGNOSIS — E1122 Type 2 diabetes mellitus with diabetic chronic kidney disease: Secondary | ICD-10-CM | POA: Diagnosis not present

## 2018-02-18 DIAGNOSIS — Z794 Long term (current) use of insulin: Secondary | ICD-10-CM | POA: Diagnosis not present

## 2018-02-18 DIAGNOSIS — G4730 Sleep apnea, unspecified: Secondary | ICD-10-CM | POA: Diagnosis not present

## 2018-02-18 DIAGNOSIS — L97522 Non-pressure chronic ulcer of other part of left foot with fat layer exposed: Secondary | ICD-10-CM | POA: Diagnosis not present

## 2018-02-18 DIAGNOSIS — E11621 Type 2 diabetes mellitus with foot ulcer: Secondary | ICD-10-CM | POA: Diagnosis not present

## 2018-02-18 DIAGNOSIS — Z87891 Personal history of nicotine dependence: Secondary | ICD-10-CM | POA: Insufficient documentation

## 2018-02-18 DIAGNOSIS — I120 Hypertensive chronic kidney disease with stage 5 chronic kidney disease or end stage renal disease: Secondary | ICD-10-CM | POA: Diagnosis not present

## 2018-02-18 DIAGNOSIS — E1142 Type 2 diabetes mellitus with diabetic polyneuropathy: Secondary | ICD-10-CM | POA: Insufficient documentation

## 2018-02-18 DIAGNOSIS — L97526 Non-pressure chronic ulcer of other part of left foot with bone involvement without evidence of necrosis: Secondary | ICD-10-CM | POA: Insufficient documentation

## 2018-02-18 DIAGNOSIS — N186 End stage renal disease: Secondary | ICD-10-CM | POA: Insufficient documentation

## 2018-02-21 ENCOUNTER — Other Ambulatory Visit: Payer: Self-pay | Admitting: Internal Medicine

## 2018-02-24 DIAGNOSIS — E1142 Type 2 diabetes mellitus with diabetic polyneuropathy: Secondary | ICD-10-CM | POA: Diagnosis not present

## 2018-02-24 DIAGNOSIS — L97522 Non-pressure chronic ulcer of other part of left foot with fat layer exposed: Secondary | ICD-10-CM | POA: Diagnosis not present

## 2018-02-24 DIAGNOSIS — Z87891 Personal history of nicotine dependence: Secondary | ICD-10-CM | POA: Diagnosis not present

## 2018-02-24 DIAGNOSIS — I120 Hypertensive chronic kidney disease with stage 5 chronic kidney disease or end stage renal disease: Secondary | ICD-10-CM | POA: Diagnosis not present

## 2018-02-24 DIAGNOSIS — N186 End stage renal disease: Secondary | ICD-10-CM | POA: Diagnosis not present

## 2018-02-24 DIAGNOSIS — L97526 Non-pressure chronic ulcer of other part of left foot with bone involvement without evidence of necrosis: Secondary | ICD-10-CM | POA: Diagnosis not present

## 2018-02-24 DIAGNOSIS — G4730 Sleep apnea, unspecified: Secondary | ICD-10-CM | POA: Diagnosis not present

## 2018-02-24 DIAGNOSIS — E1122 Type 2 diabetes mellitus with diabetic chronic kidney disease: Secondary | ICD-10-CM | POA: Diagnosis not present

## 2018-02-24 DIAGNOSIS — E11621 Type 2 diabetes mellitus with foot ulcer: Secondary | ICD-10-CM | POA: Diagnosis not present

## 2018-02-24 DIAGNOSIS — Z794 Long term (current) use of insulin: Secondary | ICD-10-CM | POA: Diagnosis not present

## 2018-02-28 DIAGNOSIS — E119 Type 2 diabetes mellitus without complications: Secondary | ICD-10-CM | POA: Diagnosis not present

## 2018-02-28 DIAGNOSIS — L97521 Non-pressure chronic ulcer of other part of left foot limited to breakdown of skin: Secondary | ICD-10-CM | POA: Diagnosis not present

## 2018-02-28 DIAGNOSIS — E11621 Type 2 diabetes mellitus with foot ulcer: Secondary | ICD-10-CM | POA: Diagnosis not present

## 2018-02-28 DIAGNOSIS — Z794 Long term (current) use of insulin: Secondary | ICD-10-CM | POA: Diagnosis not present

## 2018-03-01 ENCOUNTER — Encounter: Payer: Medicare Other | Admitting: Internal Medicine

## 2018-03-02 DIAGNOSIS — G894 Chronic pain syndrome: Secondary | ICD-10-CM | POA: Diagnosis not present

## 2018-03-02 DIAGNOSIS — M545 Low back pain: Secondary | ICD-10-CM | POA: Diagnosis not present

## 2018-03-02 DIAGNOSIS — M79672 Pain in left foot: Secondary | ICD-10-CM | POA: Diagnosis not present

## 2018-03-02 DIAGNOSIS — Z79891 Long term (current) use of opiate analgesic: Secondary | ICD-10-CM | POA: Diagnosis not present

## 2018-03-03 DIAGNOSIS — G4730 Sleep apnea, unspecified: Secondary | ICD-10-CM | POA: Diagnosis not present

## 2018-03-03 DIAGNOSIS — L97522 Non-pressure chronic ulcer of other part of left foot with fat layer exposed: Secondary | ICD-10-CM | POA: Diagnosis not present

## 2018-03-03 DIAGNOSIS — I120 Hypertensive chronic kidney disease with stage 5 chronic kidney disease or end stage renal disease: Secondary | ICD-10-CM | POA: Diagnosis not present

## 2018-03-03 DIAGNOSIS — N186 End stage renal disease: Secondary | ICD-10-CM | POA: Diagnosis not present

## 2018-03-03 DIAGNOSIS — Z87891 Personal history of nicotine dependence: Secondary | ICD-10-CM | POA: Diagnosis not present

## 2018-03-03 DIAGNOSIS — L97526 Non-pressure chronic ulcer of other part of left foot with bone involvement without evidence of necrosis: Secondary | ICD-10-CM | POA: Diagnosis not present

## 2018-03-03 DIAGNOSIS — E11621 Type 2 diabetes mellitus with foot ulcer: Secondary | ICD-10-CM | POA: Diagnosis not present

## 2018-03-03 DIAGNOSIS — E1122 Type 2 diabetes mellitus with diabetic chronic kidney disease: Secondary | ICD-10-CM | POA: Diagnosis not present

## 2018-03-03 DIAGNOSIS — Z794 Long term (current) use of insulin: Secondary | ICD-10-CM | POA: Diagnosis not present

## 2018-03-03 DIAGNOSIS — E1142 Type 2 diabetes mellitus with diabetic polyneuropathy: Secondary | ICD-10-CM | POA: Diagnosis not present

## 2018-03-04 DIAGNOSIS — E113311 Type 2 diabetes mellitus with moderate nonproliferative diabetic retinopathy with macular edema, right eye: Secondary | ICD-10-CM | POA: Diagnosis not present

## 2018-03-07 DIAGNOSIS — G4733 Obstructive sleep apnea (adult) (pediatric): Secondary | ICD-10-CM | POA: Diagnosis not present

## 2018-03-10 ENCOUNTER — Other Ambulatory Visit (HOSPITAL_COMMUNITY)
Admission: RE | Admit: 2018-03-10 | Discharge: 2018-03-10 | Disposition: A | Discharge status: Home / Self Care | Payer: Medicare Other | Source: Other Acute Inpatient Hospital | Attending: Internal Medicine | Admitting: Internal Medicine

## 2018-03-10 ENCOUNTER — Ambulatory Visit: Payer: Medicare Other | Admitting: Dietician

## 2018-03-10 ENCOUNTER — Other Ambulatory Visit (HOSPITAL_BASED_OUTPATIENT_CLINIC_OR_DEPARTMENT_OTHER): Payer: Self-pay | Admitting: Internal Medicine

## 2018-03-10 DIAGNOSIS — N186 End stage renal disease: Secondary | ICD-10-CM | POA: Diagnosis not present

## 2018-03-10 DIAGNOSIS — E11621 Type 2 diabetes mellitus with foot ulcer: Secondary | ICD-10-CM | POA: Insufficient documentation

## 2018-03-10 DIAGNOSIS — G4730 Sleep apnea, unspecified: Secondary | ICD-10-CM | POA: Diagnosis not present

## 2018-03-10 DIAGNOSIS — I120 Hypertensive chronic kidney disease with stage 5 chronic kidney disease or end stage renal disease: Secondary | ICD-10-CM | POA: Diagnosis not present

## 2018-03-10 DIAGNOSIS — M868X7 Other osteomyelitis, ankle and foot: Secondary | ICD-10-CM | POA: Diagnosis not present

## 2018-03-10 DIAGNOSIS — E1142 Type 2 diabetes mellitus with diabetic polyneuropathy: Secondary | ICD-10-CM | POA: Diagnosis not present

## 2018-03-10 DIAGNOSIS — Z87891 Personal history of nicotine dependence: Secondary | ICD-10-CM | POA: Diagnosis not present

## 2018-03-10 DIAGNOSIS — Z794 Long term (current) use of insulin: Secondary | ICD-10-CM | POA: Diagnosis not present

## 2018-03-10 DIAGNOSIS — E1122 Type 2 diabetes mellitus with diabetic chronic kidney disease: Secondary | ICD-10-CM | POA: Diagnosis not present

## 2018-03-10 DIAGNOSIS — L97526 Non-pressure chronic ulcer of other part of left foot with bone involvement without evidence of necrosis: Secondary | ICD-10-CM | POA: Diagnosis not present

## 2018-03-11 ENCOUNTER — Other Ambulatory Visit (HOSPITAL_BASED_OUTPATIENT_CLINIC_OR_DEPARTMENT_OTHER): Payer: Self-pay | Admitting: Internal Medicine

## 2018-03-11 ENCOUNTER — Ambulatory Visit (HOSPITAL_COMMUNITY)
Admission: RE | Admit: 2018-03-11 | Discharge: 2018-03-11 | Disposition: A | Discharge status: Home / Self Care | Payer: Medicare Other | Source: Ambulatory Visit | Attending: Internal Medicine | Admitting: Internal Medicine

## 2018-03-11 DIAGNOSIS — M86172 Other acute osteomyelitis, left ankle and foot: Secondary | ICD-10-CM

## 2018-03-11 DIAGNOSIS — M7989 Other specified soft tissue disorders: Secondary | ICD-10-CM | POA: Diagnosis not present

## 2018-03-15 ENCOUNTER — Other Ambulatory Visit: Payer: Self-pay | Admitting: Endocrinology

## 2018-03-16 DIAGNOSIS — E119 Type 2 diabetes mellitus without complications: Secondary | ICD-10-CM | POA: Diagnosis not present

## 2018-03-16 DIAGNOSIS — E11621 Type 2 diabetes mellitus with foot ulcer: Secondary | ICD-10-CM | POA: Diagnosis not present

## 2018-03-16 LAB — AEROBIC/ANAEROBIC CULTURE W GRAM STAIN (SURGICAL/DEEP WOUND)

## 2018-03-16 LAB — AEROBIC/ANAEROBIC CULTURE (SURGICAL/DEEP WOUND)

## 2018-03-17 ENCOUNTER — Encounter (HOSPITAL_BASED_OUTPATIENT_CLINIC_OR_DEPARTMENT_OTHER): Payer: Medicare Other | Attending: Internal Medicine

## 2018-03-17 ENCOUNTER — Inpatient Hospital Stay (HOSPITAL_COMMUNITY)
Admission: EM | Admit: 2018-03-17 | Discharge: 2018-03-24 | DRG: 617 | Disposition: A | Discharge status: Skilled Nursing Facility | Payer: Medicare Other | Source: Ambulatory Visit | Attending: Internal Medicine | Admitting: Internal Medicine

## 2018-03-17 ENCOUNTER — Emergency Department (HOSPITAL_COMMUNITY): Payer: Medicare Other

## 2018-03-17 ENCOUNTER — Other Ambulatory Visit: Payer: Self-pay

## 2018-03-17 ENCOUNTER — Encounter (HOSPITAL_COMMUNITY): Payer: Self-pay

## 2018-03-17 DIAGNOSIS — M86172 Other acute osteomyelitis, left ankle and foot: Secondary | ICD-10-CM | POA: Diagnosis not present

## 2018-03-17 DIAGNOSIS — L03116 Cellulitis of left lower limb: Secondary | ICD-10-CM | POA: Diagnosis not present

## 2018-03-17 DIAGNOSIS — E1142 Type 2 diabetes mellitus with diabetic polyneuropathy: Secondary | ICD-10-CM | POA: Diagnosis present

## 2018-03-17 DIAGNOSIS — I5032 Chronic diastolic (congestive) heart failure: Secondary | ICD-10-CM | POA: Diagnosis present

## 2018-03-17 DIAGNOSIS — IMO0002 Reserved for concepts with insufficient information to code with codable children: Secondary | ICD-10-CM | POA: Diagnosis present

## 2018-03-17 DIAGNOSIS — L97522 Non-pressure chronic ulcer of other part of left foot with fat layer exposed: Secondary | ICD-10-CM | POA: Diagnosis not present

## 2018-03-17 DIAGNOSIS — I480 Paroxysmal atrial fibrillation: Secondary | ICD-10-CM | POA: Diagnosis not present

## 2018-03-17 DIAGNOSIS — L03119 Cellulitis of unspecified part of limb: Secondary | ICD-10-CM

## 2018-03-17 DIAGNOSIS — E1122 Type 2 diabetes mellitus with diabetic chronic kidney disease: Secondary | ICD-10-CM | POA: Diagnosis present

## 2018-03-17 DIAGNOSIS — G4733 Obstructive sleep apnea (adult) (pediatric): Secondary | ICD-10-CM | POA: Diagnosis not present

## 2018-03-17 DIAGNOSIS — Z87891 Personal history of nicotine dependence: Secondary | ICD-10-CM

## 2018-03-17 DIAGNOSIS — M6702 Short Achilles tendon (acquired), left ankle: Secondary | ICD-10-CM | POA: Diagnosis present

## 2018-03-17 DIAGNOSIS — N186 End stage renal disease: Secondary | ICD-10-CM | POA: Diagnosis not present

## 2018-03-17 DIAGNOSIS — E1169 Type 2 diabetes mellitus with other specified complication: Secondary | ICD-10-CM | POA: Diagnosis not present

## 2018-03-17 DIAGNOSIS — N183 Chronic kidney disease, stage 3 unspecified: Secondary | ICD-10-CM | POA: Diagnosis present

## 2018-03-17 DIAGNOSIS — I10 Essential (primary) hypertension: Secondary | ICD-10-CM | POA: Diagnosis not present

## 2018-03-17 DIAGNOSIS — F1010 Alcohol abuse, uncomplicated: Secondary | ICD-10-CM | POA: Diagnosis present

## 2018-03-17 DIAGNOSIS — Z4781 Encounter for orthopedic aftercare following surgical amputation: Secondary | ICD-10-CM | POA: Diagnosis not present

## 2018-03-17 DIAGNOSIS — L97529 Non-pressure chronic ulcer of other part of left foot with unspecified severity: Secondary | ICD-10-CM | POA: Diagnosis not present

## 2018-03-17 DIAGNOSIS — L97524 Non-pressure chronic ulcer of other part of left foot with necrosis of bone: Secondary | ICD-10-CM | POA: Diagnosis not present

## 2018-03-17 DIAGNOSIS — R262 Difficulty in walking, not elsewhere classified: Secondary | ICD-10-CM | POA: Diagnosis not present

## 2018-03-17 DIAGNOSIS — M24572 Contracture, left ankle: Secondary | ICD-10-CM | POA: Diagnosis not present

## 2018-03-17 DIAGNOSIS — R609 Edema, unspecified: Secondary | ICD-10-CM

## 2018-03-17 DIAGNOSIS — Z89422 Acquired absence of other left toe(s): Secondary | ICD-10-CM

## 2018-03-17 DIAGNOSIS — E1165 Type 2 diabetes mellitus with hyperglycemia: Secondary | ICD-10-CM | POA: Diagnosis not present

## 2018-03-17 DIAGNOSIS — E785 Hyperlipidemia, unspecified: Secondary | ICD-10-CM | POA: Diagnosis present

## 2018-03-17 DIAGNOSIS — E11649 Type 2 diabetes mellitus with hypoglycemia without coma: Secondary | ICD-10-CM | POA: Diagnosis not present

## 2018-03-17 DIAGNOSIS — Z7901 Long term (current) use of anticoagulants: Secondary | ICD-10-CM

## 2018-03-17 DIAGNOSIS — M869 Osteomyelitis, unspecified: Secondary | ICD-10-CM | POA: Diagnosis not present

## 2018-03-17 DIAGNOSIS — M7989 Other specified soft tissue disorders: Secondary | ICD-10-CM

## 2018-03-17 DIAGNOSIS — Z833 Family history of diabetes mellitus: Secondary | ICD-10-CM

## 2018-03-17 DIAGNOSIS — I152 Hypertension secondary to endocrine disorders: Secondary | ICD-10-CM | POA: Diagnosis not present

## 2018-03-17 DIAGNOSIS — E11621 Type 2 diabetes mellitus with foot ulcer: Secondary | ICD-10-CM | POA: Insufficient documentation

## 2018-03-17 DIAGNOSIS — L02619 Cutaneous abscess of unspecified foot: Secondary | ICD-10-CM | POA: Diagnosis present

## 2018-03-17 DIAGNOSIS — Z794 Long term (current) use of insulin: Secondary | ICD-10-CM

## 2018-03-17 DIAGNOSIS — G4730 Sleep apnea, unspecified: Secondary | ICD-10-CM | POA: Diagnosis not present

## 2018-03-17 DIAGNOSIS — N179 Acute kidney failure, unspecified: Secondary | ICD-10-CM | POA: Diagnosis not present

## 2018-03-17 DIAGNOSIS — H1013 Acute atopic conjunctivitis, bilateral: Secondary | ICD-10-CM | POA: Diagnosis not present

## 2018-03-17 DIAGNOSIS — Z9641 Presence of insulin pump (external) (internal): Secondary | ICD-10-CM | POA: Diagnosis not present

## 2018-03-17 DIAGNOSIS — E11319 Type 2 diabetes mellitus with unspecified diabetic retinopathy without macular edema: Secondary | ICD-10-CM | POA: Diagnosis present

## 2018-03-17 DIAGNOSIS — Z6841 Body Mass Index (BMI) 40.0 and over, adult: Secondary | ICD-10-CM

## 2018-03-17 DIAGNOSIS — R279 Unspecified lack of coordination: Secondary | ICD-10-CM | POA: Diagnosis not present

## 2018-03-17 DIAGNOSIS — K219 Gastro-esophageal reflux disease without esophagitis: Secondary | ICD-10-CM | POA: Diagnosis present

## 2018-03-17 DIAGNOSIS — Z808 Family history of malignant neoplasm of other organs or systems: Secondary | ICD-10-CM

## 2018-03-17 DIAGNOSIS — L02612 Cutaneous abscess of left foot: Secondary | ICD-10-CM | POA: Diagnosis not present

## 2018-03-17 DIAGNOSIS — M868X7 Other osteomyelitis, ankle and foot: Secondary | ICD-10-CM | POA: Diagnosis not present

## 2018-03-17 DIAGNOSIS — I130 Hypertensive heart and chronic kidney disease with heart failure and stage 1 through stage 4 chronic kidney disease, or unspecified chronic kidney disease: Secondary | ICD-10-CM | POA: Diagnosis not present

## 2018-03-17 DIAGNOSIS — Z743 Need for continuous supervision: Secondary | ICD-10-CM | POA: Diagnosis not present

## 2018-03-17 DIAGNOSIS — Z8249 Family history of ischemic heart disease and other diseases of the circulatory system: Secondary | ICD-10-CM

## 2018-03-17 DIAGNOSIS — D62 Acute posthemorrhagic anemia: Secondary | ICD-10-CM | POA: Diagnosis not present

## 2018-03-17 DIAGNOSIS — R600 Localized edema: Secondary | ICD-10-CM | POA: Diagnosis not present

## 2018-03-17 DIAGNOSIS — M6281 Muscle weakness (generalized): Secondary | ICD-10-CM | POA: Diagnosis not present

## 2018-03-17 LAB — CBC WITH DIFFERENTIAL/PLATELET
Basophils Absolute: 0 10*3/uL (ref 0.0–0.1)
Basophils Relative: 0 %
Eosinophils Absolute: 0.5 10*3/uL (ref 0.0–0.7)
Eosinophils Relative: 5 %
HCT: 35 % — ABNORMAL LOW (ref 39.0–52.0)
Hemoglobin: 11.4 g/dL — ABNORMAL LOW (ref 13.0–17.0)
Lymphocytes Relative: 18 %
Lymphs Abs: 1.6 10*3/uL (ref 0.7–4.0)
MCH: 28.6 pg (ref 26.0–34.0)
MCHC: 32.6 g/dL (ref 30.0–36.0)
MCV: 87.9 fL (ref 78.0–100.0)
Monocytes Absolute: 0.6 10*3/uL (ref 0.1–1.0)
Monocytes Relative: 7 %
Neutro Abs: 5.9 10*3/uL (ref 1.7–7.7)
Neutrophils Relative %: 70 %
Platelets: 286 10*3/uL (ref 150–400)
RBC: 3.98 MIL/uL — ABNORMAL LOW (ref 4.22–5.81)
RDW: 14.9 % (ref 11.5–15.5)
WBC: 8.5 10*3/uL (ref 4.0–10.5)

## 2018-03-17 LAB — GLUCOSE, CAPILLARY
Glucose-Capillary: 185 mg/dL — ABNORMAL HIGH (ref 70–99)
Glucose-Capillary: 79 mg/dL (ref 70–99)

## 2018-03-17 LAB — BASIC METABOLIC PANEL
Anion gap: 10 (ref 5–15)
BUN: 24 mg/dL — ABNORMAL HIGH (ref 8–23)
CO2: 25 mmol/L (ref 22–32)
Calcium: 9.3 mg/dL (ref 8.9–10.3)
Chloride: 105 mmol/L (ref 98–111)
Creatinine, Ser: 1.91 mg/dL — ABNORMAL HIGH (ref 0.61–1.24)
GFR calc Af Amer: 40 mL/min — ABNORMAL LOW (ref >60)
GFR calc non Af Amer: 34 mL/min — ABNORMAL LOW (ref >60)
Glucose, Bld: 152 mg/dL — ABNORMAL HIGH (ref 70–99)
Potassium: 4.6 mmol/L (ref 3.5–5.1)
Sodium: 140 mmol/L (ref 135–145)

## 2018-03-17 LAB — I-STAT CG4 LACTIC ACID, ED: Lactic Acid, Venous: 1.1 mmol/L (ref 0.5–1.9)

## 2018-03-17 MED ORDER — SODIUM CHLORIDE 0.9% FLUSH: 3.0000 mL | INTRAVENOUS | Status: DC | PRN

## 2018-03-17 MED ORDER — VITAMIN B-1 100 MG PO TABS
100.0000 mg | ORAL_TABLET | Freq: Every day | ORAL | Status: DC
  Administered 2018-03-18 – 2018-03-24 (×7): 100 mg via ORAL
  Filled 2018-03-17 (×7): qty 1

## 2018-03-17 MED ORDER — SODIUM CHLORIDE 0.9 % IV BOLUS
1000.0000 mL | Freq: Once | INTRAVENOUS | Status: AC
  Administered 2018-03-17: 1000 mL via INTRAVENOUS

## 2018-03-17 MED ORDER — THIAMINE HCL 100 MG/ML IJ SOLN: 100.0000 mg | Freq: Every day | INTRAMUSCULAR | Status: DC

## 2018-03-17 MED ORDER — LORAZEPAM 1 MG PO TABS: 1.0000 mg | ORAL_TABLET | Freq: Four times a day (QID) | ORAL | Status: AC | PRN

## 2018-03-17 MED ORDER — ALBUTEROL SULFATE HFA 108 (90 BASE) MCG/ACT IN AERS: 2.0000 | INHALATION_SPRAY | Freq: Four times a day (QID) | RESPIRATORY_TRACT | Status: DC | PRN

## 2018-03-17 MED ORDER — INSULIN ASPART 100 UNIT/ML ~~LOC~~ SOLN
8.0000 [IU] | Freq: Three times a day (TID) | SUBCUTANEOUS | Status: DC
  Administered 2018-03-17 – 2018-03-19 (×6): 8 [IU] via SUBCUTANEOUS

## 2018-03-17 MED ORDER — VANCOMYCIN HCL IN DEXTROSE 1-5 GM/200ML-% IV SOLN
1000.0000 mg | Freq: Once | INTRAVENOUS | Status: AC
  Administered 2018-03-17: 1000 mg via INTRAVENOUS
  Filled 2018-03-17: qty 200

## 2018-03-17 MED ORDER — FOLIC ACID 1 MG PO TABS
1.0000 mg | ORAL_TABLET | Freq: Every day | ORAL | Status: DC
  Administered 2018-03-18 – 2018-03-24 (×7): 1 mg via ORAL
  Filled 2018-03-17 (×7): qty 1

## 2018-03-17 MED ORDER — METOPROLOL SUCCINATE ER 25 MG PO TB24
25.0000 mg | ORAL_TABLET | Freq: Every day | ORAL | Status: DC
  Administered 2018-03-17 – 2018-03-23 (×7): 25 mg via ORAL
  Filled 2018-03-17 (×7): qty 1

## 2018-03-17 MED ORDER — ATORVASTATIN CALCIUM 10 MG PO TABS
20.0000 mg | ORAL_TABLET | Freq: Every day | ORAL | Status: DC
  Administered 2018-03-17 – 2018-03-23 (×7): 20 mg via ORAL
  Filled 2018-03-17 (×7): qty 2

## 2018-03-17 MED ORDER — HYDROCODONE-ACETAMINOPHEN 7.5-325 MG PO TABS
1.0000 | ORAL_TABLET | Freq: Four times a day (QID) | ORAL | Status: DC | PRN
  Administered 2018-03-17 – 2018-03-23 (×12): 2 via ORAL
  Filled 2018-03-17 (×12): qty 2

## 2018-03-17 MED ORDER — ADULT MULTIVITAMIN W/MINERALS CH
1.0000 | ORAL_TABLET | Freq: Every day | ORAL | Status: DC
  Administered 2018-03-18 – 2018-03-24 (×7): 1 via ORAL
  Filled 2018-03-17 (×7): qty 1

## 2018-03-17 MED ORDER — APIXABAN 5 MG PO TABS
5.0000 mg | ORAL_TABLET | Freq: Two times a day (BID) | ORAL | Status: DC
  Administered 2018-03-17 – 2018-03-19 (×5): 5 mg via ORAL
  Filled 2018-03-17 (×5): qty 1

## 2018-03-17 MED ORDER — HYDRALAZINE HCL 20 MG/ML IJ SOLN
10.0000 mg | Freq: Four times a day (QID) | INTRAMUSCULAR | Status: DC | PRN
  Administered 2018-03-23: 10 mg via INTRAVENOUS
  Filled 2018-03-17: qty 1

## 2018-03-17 MED ORDER — LORAZEPAM 2 MG/ML IJ SOLN: 1.0000 mg | Freq: Four times a day (QID) | INTRAMUSCULAR | Status: AC | PRN

## 2018-03-17 MED ORDER — VITAMIN D3 25 MCG (1000 UNIT) PO TABS
2000.0000 [IU] | ORAL_TABLET | Freq: Every day | ORAL | Status: DC
  Administered 2018-03-18 – 2018-03-24 (×7): 2000 [IU] via ORAL
  Filled 2018-03-17 (×7): qty 2

## 2018-03-17 MED ORDER — ONDANSETRON HCL 4 MG/2ML IJ SOLN
4.0000 mg | Freq: Four times a day (QID) | INTRAMUSCULAR | Status: DC | PRN
  Administered 2018-03-18: 4 mg via INTRAVENOUS
  Filled 2018-03-17: qty 2

## 2018-03-17 MED ORDER — ACETAMINOPHEN 325 MG PO TABS: 650.0000 mg | ORAL_TABLET | Freq: Four times a day (QID) | ORAL | Status: DC | PRN

## 2018-03-17 MED ORDER — GABAPENTIN 300 MG PO CAPS
300.0000 mg | ORAL_CAPSULE | Freq: Every day | ORAL | Status: DC
  Administered 2018-03-17 – 2018-03-22 (×6): 300 mg via ORAL
  Filled 2018-03-17 (×6): qty 1

## 2018-03-17 MED ORDER — BRINZOLAMIDE 1 % OP SUSP
1.0000 [drp] | Freq: Two times a day (BID) | OPHTHALMIC | Status: DC
  Administered 2018-03-17 – 2018-03-24 (×13): 1 [drp] via OPHTHALMIC
  Filled 2018-03-17: qty 10

## 2018-03-17 MED ORDER — POLYETHYLENE GLYCOL 3350 17 G PO PACK: 17.0000 g | PACK | Freq: Every day | ORAL | Status: DC | PRN

## 2018-03-17 MED ORDER — SODIUM CHLORIDE 0.9% FLUSH
3.0000 mL | Freq: Two times a day (BID) | INTRAVENOUS | Status: DC
  Administered 2018-03-19 – 2018-03-24 (×5): 3 mL via INTRAVENOUS

## 2018-03-17 MED ORDER — TRAZODONE HCL 50 MG PO TABS: 50.0000 mg | ORAL_TABLET | Freq: Every evening | ORAL | Status: DC | PRN

## 2018-03-17 MED ORDER — SODIUM CHLORIDE 0.9 % IV SOLN: 250.0000 mL | INTRAVENOUS | Status: DC | PRN

## 2018-03-17 MED ORDER — INSULIN PUMP
Freq: Three times a day (TID) | SUBCUTANEOUS | Status: DC
  Filled 2018-03-17: qty 1

## 2018-03-17 MED ORDER — ONDANSETRON HCL 4 MG PO TABS: 4.0000 mg | ORAL_TABLET | Freq: Four times a day (QID) | ORAL | Status: DC | PRN

## 2018-03-17 MED ORDER — INSULIN ASPART 100 UNIT/ML ~~LOC~~ SOLN: 0.0000 [IU] | Freq: Every day | SUBCUTANEOUS | Status: DC

## 2018-03-17 MED ORDER — ACETAMINOPHEN 650 MG RE SUPP: 650.0000 mg | Freq: Four times a day (QID) | RECTAL | Status: DC | PRN

## 2018-03-17 MED ORDER — SODIUM CHLORIDE 0.9 % IV SOLN
2.0000 g | Freq: Three times a day (TID) | INTRAVENOUS | Status: DC
  Administered 2018-03-17 – 2018-03-23 (×16): 2 g via INTRAVENOUS
  Filled 2018-03-17 (×18): qty 2

## 2018-03-17 MED ORDER — INSULIN ASPART 100 UNIT/ML ~~LOC~~ SOLN
0.0000 [IU] | Freq: Three times a day (TID) | SUBCUTANEOUS | Status: DC
  Administered 2018-03-17: 4 [IU] via SUBCUTANEOUS
  Administered 2018-03-18: 11 [IU] via SUBCUTANEOUS
  Administered 2018-03-18: 4 [IU] via SUBCUTANEOUS
  Administered 2018-03-18: 7 [IU] via SUBCUTANEOUS
  Administered 2018-03-19: 4 [IU] via SUBCUTANEOUS
  Administered 2018-03-19: 11 [IU] via SUBCUTANEOUS
  Administered 2018-03-19: 4 [IU] via SUBCUTANEOUS
  Administered 2018-03-20: 3 [IU] via SUBCUTANEOUS
  Administered 2018-03-20 – 2018-03-22 (×3): 4 [IU] via SUBCUTANEOUS
  Administered 2018-03-23: 3 [IU] via SUBCUTANEOUS
  Administered 2018-03-23: 4 [IU] via SUBCUTANEOUS
  Administered 2018-03-24: 7 [IU] via SUBCUTANEOUS

## 2018-03-17 MED ORDER — ALBUTEROL SULFATE (2.5 MG/3ML) 0.083% IN NEBU: 2.5000 mg | INHALATION_SOLUTION | RESPIRATORY_TRACT | Status: DC | PRN

## 2018-03-17 MED ORDER — METRONIDAZOLE IN NACL 5-0.79 MG/ML-% IV SOLN
500.0000 mg | Freq: Three times a day (TID) | INTRAVENOUS | Status: DC
  Administered 2018-03-17 – 2018-03-23 (×17): 500 mg via INTRAVENOUS
  Filled 2018-03-17 (×17): qty 100

## 2018-03-17 MED ORDER — INSULIN GLARGINE 100 UNIT/ML ~~LOC~~ SOLN
40.0000 [IU] | Freq: Two times a day (BID) | SUBCUTANEOUS | Status: DC
  Administered 2018-03-17 – 2018-03-19 (×4): 40 [IU] via SUBCUTANEOUS
  Filled 2018-03-17 (×6): qty 0.4

## 2018-03-17 MED ORDER — SODIUM CHLORIDE 0.9 % IV SOLN
2.0000 g | Freq: Once | INTRAVENOUS | Status: AC
  Administered 2018-03-17: 2 g via INTRAVENOUS
  Filled 2018-03-17: qty 2

## 2018-03-17 MED ORDER — SODIUM CHLORIDE 0.9 % IV SOLN
INTRAVENOUS | Status: DC
  Administered 2018-03-17 – 2018-03-20 (×3): via INTRAVENOUS

## 2018-03-17 MED ORDER — LATANOPROST 0.005 % OP SOLN
1.0000 [drp] | Freq: Every day | OPHTHALMIC | Status: DC
  Administered 2018-03-17 – 2018-03-23 (×7): 1 [drp] via OPHTHALMIC
  Filled 2018-03-17: qty 2.5

## 2018-03-17 MED ORDER — VANCOMYCIN HCL 10 G IV SOLR
1750.0000 mg | INTRAVENOUS | Status: DC
  Administered 2018-03-18 – 2018-03-19 (×2): 1750 mg via INTRAVENOUS
  Filled 2018-03-17 (×4): qty 1750

## 2018-03-17 MED ORDER — INSULIN PUMP: SUBCUTANEOUS | Status: DC

## 2018-03-17 MED ORDER — VANCOMYCIN HCL 10 G IV SOLR
1500.0000 mg | Freq: Once | INTRAVENOUS | Status: AC
  Administered 2018-03-17: 1500 mg via INTRAVENOUS
  Filled 2018-03-17: qty 1500

## 2018-03-17 NOTE — Progress Notes (Signed)
A consult was received from an ED physician for Vancomycin & Cefepime per pharmacy dosing.  The patient's profile has been reviewed for ht/wt/allergies/indication/available labs.   A one time order has been placed for Cefepime 2gm IV x1, Vancomycin 1gm IV x1 - no current weight noted.  Further antibiotics/pharmacy consults should be ordered by admitting physician if indicated.                       Thank you,  Minda Ditto 03/17/2018  10:18 AM

## 2018-03-17 NOTE — Progress Notes (Signed)
Inpatient Diabetes Program Recommendations  AACE/ADA: New Consensus Statement on Inpatient Glycemic Control (2015)  Target Ranges:  Prepandial:   less than 140 mg/dL      Peak postprandial:   less than 180 mg/dL (1-2 hours)      Critically ill patients:  140 - 180 mg/dL   Lab Results  Component Value Date   GLUCAP 81 04/20/2017   HGBA1C 8.0 (H) 02/03/2018    Review of Glycemic Control  Diabetes history: DM 2 Outpatient Diabetes medications: Humulin R U-500 in V-Go 20 pump Current orders for Inpatient glycemic control: Insulin pump order set  Saw patient at bedside to review insulin pump contract and setting off insulin pump.  Patient reports using Concentrated Humulin R U-500 insulin in his V-Go 20 insulin pump. Patient reports administering 2 clicks for breakfast, 4 clicks for lunch, 6 clicks for supper.  In Total patient uses 100 units of basal insulin 10 units breakfast 20 units lunch 30 units supper  Patient reports not having insulin pump supplies here at the hospital and he is due to change his insulin pump at 5 pm today. Wife is able to bring supplies tomorrow if needed. Patient is also ok with being on SQ regimen until day of discharge.  Would recommend placing patient on Lantus 40 units BID. Novolog Resistant Correction 0-20 units tid Novolog 0-5 units qhs bedtime scale Novolog 8-10 units tid meal coverage if patient consumes at least 50% of meals and glucose is at least 80 mg/dl.  Discussed plan of care with Dr. Denton Brick. New orders received.  Thanks,  Tama Headings RN, MSN, BC-ADM Inpatient Diabetes Coordinator Team Pager [phone] (8a-5p)

## 2018-03-17 NOTE — H&P (Addendum)
Patient Demographics:    Jens Siems, is a 67 y.o. male  MRN: [id number]   DOB - [birth year]  Admit Date - 03/17/2018  Outpatient Primary MD for the patient is Aldine Contes, MD   Assessment & Plan:    Principal Problem:   Cellulitis and abscess of foot Active Problems:   Diabetes type 2, uncontrolled ([place])   Obstructive sleep apnea   Diabetic peripheral neuropathy associated with type 2 diabetes mellitus ([place])   Chronic kidney disease, stage III (moderate) (HCC)   Morbid obesity ([place])   Hypertension   Foot ulcer, left (HCC)   Paroxysmal atrial fibrillation ([place])    1)Proteus mirabilis, Pseudomonas aeruginosa, and MRSA cellulitis and possible osteomyelitis of the left foot--- please see bone culture dated 03/10/2018, patient was on Levaquin and doxycycline for the last couple days but leg swelling pain and warmth and swelling and redness worsened, continue IV cefepime/vancomycin and Flagyl as ordered by ED provider pending MRI of the left foot.  Orthopedic input from Dr. Berenice Primas appreciated, consider ID consult to help determine length of antibiotic therapy, depending on MRI findings  2)MD2-A1c is 8.0, patient uses insulin pump at home, diabetic educator admitted changes to insulin regimen however patient is here, patient offered insulin pump contract while he is here  3) morbid obesity/OSA--- this complicates overall care, CPAP nightly as ordered  4)PAFib--- stable, continue metoprolol 25 mg daily for rate control, give apixaban for stroke prophylaxis  5)AKI----acute kidney injury on CKD stage - III, suspect worsening renal function due to dehydration, hydrate gently    creatinine on admission= 1.9 ,   baseline creatinine = 1.6    ,  , renally adjust medications, avoid nephrotoxic  agents/dehydration/hypotension  6)Etoh Use- Lorazepam per CIWA, MVI/folic acid  With History of - Reviewed by me  Past Medical History:  Diagnosis Date  . Arthritis    "elbows & knees" (12/13/2014)  . Asthma   . CKD (chronic kidney disease), stage III ([place])   . DIABETIC FOOT ULCER 06/20/2009  . Edema, macular, due to secondary diabetes ([place])   . Erectile dysfunction   . GERD (gastroesophageal reflux disease)   . HEARING LOSS, SENSORINEURAL, BILATERAL 01/03/2007   Seen by ENT Dr. Orpah Greek D. Redmond Baseman 01/03/07  . Hemorrhoids   . History of echocardiogram    a. 04/2008 Echo: EF 50-55%, abnl LV relaxation, mildly dil LA.  Marland Kitchen Hyperlipidemia   . Hypertension   . Morbid obesity ([place])   . Neuropathy, lower extremity   . OSA (obstructive sleep apnea)    uses CPAP nightly  . OSA on CPAP    Nocturnal polysomnogram on 01/21/2010 showed severe obstructive sleep apnea/hypopnea syndrome, AHI 74.1 per hour with non positional events, moderately loud snoring, and oxygen desaturation to a nadir of 78% on room air.  CPAP was successfully titrated to 17 CWP, AHI 1.1 per hour using a large ResMed Mirage Quattro full-face mask with heated humidifier. Bruxism was  noted.   . Osteomyelitis of ankle and foot ([place])   . Retinopathy   . Type II diabetes mellitus (HCC)    w/complication NOS, type II      Past Surgical History:  Procedure Laterality Date  . AMPUTATION Left 12/15/2014   Procedure: LEFT SECOND TOE AMPUTATION ;  Surgeon: Dorna Leitz, MD;  Location: [facility];  Service: Orthopedics;  Laterality: Left;  . 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 Kathalene Frames. Mayer Camel.  . WOUND DEBRIDEMENT Left 06/15/2016   Procedure: DEBRIDEMENT WOUND LEFT FOOT WITH GRAFT APPLICATION;  Surgeon: Landis Martins, DPM;  Location: [facility];  Service: Podiatry;  Laterality: Left;      Chief Complaint  Patient presents with  . Leg  Swelling  . Wound Infection      HPI:    Levelle Edelen  is a 67 y.o. male  with history of CKD, diabetes, GERD, [name], HTN, morbid obesity, OSA presents for evaluation of acute onset, progressively worsening left foot wound and left lower extremity edema.  7 days ago he followed up with his wound care physician Dr. Dellia Nims and had a cast removed for known foot ulcer overlying the the plantar aspect of head of the left third metatarsal.  At that time, his physician noted his wound had worsened and tracked all the way down to the bone.  He obtained a sample of the bone on 03/10/18 sent for culture to evaluate for osteomyelitis.  Cultures came back positive for Proteus mirabilis, Pseudomonas aeruginosa, and MRSA.  He has been on Levaquin and doxycycline for 2 days.  He notes subjective fevers and chills, nausea, decreased appetite. Notes yellow drainage from the wound. Has not taken his temperature at home, denies vomiting, abdominal pain, chest pain, or shortness of breath.  He also notes over the past 2 days he has had progressively worsening swelling of the left lower extremity.  Denies any significant pain.  Sent from his wound care facility to the ED for IV antibiotics and management of osteomyelitis.  Portions of the history of present illness (HPI) was obtained from ED team/records, reasonable attempt to verify accuracy of information taken by me  In ED--- patient was evaluated by Dr. Berenice Primas from Ortho, he requested MRI of the left foot however due to patient's weight and body habitus MRI of the left foot cannot be done at [facility] hospital plan is for do MRI [facility] the left foot at [facility] on 03/18/2018  - please see bone culture dated 03/10/2018, patient was on Levaquin and doxycycline for the last couple days but leg swelling pain and warmth and swelling and redness worsened   ED provider started patient on cefepime, Vanco and Flagyl    Review of systems:    In addition to the HPI above,    A full Review of  Systems was done, all other systems reviewed are negative except as noted above in HPI , .    Social History:  Reviewed by me    Social History   Tobacco Use  . Smoking status: Former Smoker    Packs/day: 0.50    Years: 30.00    Pack years: 15.00    Types: Cigarettes    Last attempt to quit: 02/10/2010    Years since quitting: 8.1  . Smokeless tobacco: Never Used  Substance Use Topics  . Alcohol use: Yes    Alcohol/week: 11.0 standard  drinks    Types: 4 Cans of beer, 3 Shots of liquor, 4 Standard drinks or equivalent per week    Comment: 12/13/2014 "I only drink on the weekend; usually only on Saturday" - beer and liquor       Family History :  Reviewed by me    Family History  Problem Relation Age of Onset  . Diabetes Mother        also 2 siblings  . Heart attack Father 34  . Throat cancer Brother      Home Medications:   Prior to Admission medications   Medication Sig Start Date End Date Taking? Authorizing Provider  albuterol (PROAIR HFA) 108 (90 Base) MCG/ACT inhaler Inhale 2 puffs into the lungs every 6 (six) hours as needed for wheezing or shortness of breath. 06/16/17  Yes Aldine Contes, MD  atorvastatin (LIPITOR) 20 MG tablet TAKE 1 TABLET(20 MG) BY MOUTH DAILY 01/11/18  Yes Narendra, Nischal, MD  AZOPT 1 % ophthalmic suspension Place 1 drop into both eyes 2 (two) times daily.  02/24/18  Yes [provider]  Cholecalciferol (VITAMIN D3) 2000 UNITS capsule Take 1 capsule (2,000 Units total) by mouth daily. 09/21/14  Yes Bertha Stakes, MD  diclofenac sodium (VOLTAREN) 1 % GEL Apply 4 g topically 4 (four) times daily. 12/28/16  Yes Bartholomew Crews, MD  docusate sodium (COLACE) 100 MG capsule Take 1 capsule (100 mg total) by mouth daily as needed for mild constipation. 12/28/16  Yes Bartholomew Crews, MD  doxycycline (MONODOX) 100 MG capsule Take 100 mg by mouth 2 (two) times daily.  03/15/18 03/25/18 Yes [provider]   ELIQUIS 5 MG TABS tablet TAKE 1 TABLET(5 MG) BY MOUTH TWICE DAILY Patient taking differently: Take 5 mg by mouth 2 (two) times daily.  12/20/17  Yes Aldine Contes, MD  enalapril (VASOTEC) 20 MG tablet TAKE 2 TABLETS(40 MG) BY MOUTH DAILY Patient taking differently: Take 40 mg by mouth daily.  02/22/18  Yes Aldine Contes, MD  furosemide (LASIX) 40 MG tablet TAKE 1 TABLET(40 MG) BY MOUTH TWICE DAILY Patient taking differently: Take 40 mg by mouth 2 (two) times daily.  12/20/17  Yes Aldine Contes, MD  gabapentin (NEURONTIN) 300 MG capsule TAKE 1 CAPSULE(300 MG) BY MOUTH AT BEDTIME 01/11/18  Yes Narendra, Nischal, MD  HUMULIN R 500 UNIT/ML injection DRAW INSULIN TO THE 68 UNIT LINE ON THE U-100 SYRINGE(TO USE 340 UNITS) PER DAY AS DIRECTED Patient taking differently: Inject 340 Units into the skin continuous.  09/14/17  Yes Elayne Snare, MD  HYDROcodone-acetaminophen (NORCO) 7.5-325 MG tablet Take 1 tablet by mouth every 6 (six) hours as needed for moderate pain. 10/28/16 03/17/18 Yes Aldine Contes, MD  Insulin Disposable Pump (V-GO 20) KIT USE 1 PUMP DAILY WITH INSULIN Patient taking differently: 1 application by Continuous infusion (non-IV) route daily. USE 1 PUMP DAILY WITH INSULIN 02/16/18  Yes Elayne Snare, MD  latanoprost (XALATAN) 0.005 % ophthalmic solution Place 1 drop into both eyes at bedtime.  03/10/18  Yes [provider]  levofloxacin (LEVAQUIN) 500 MG tablet Take 500 mg by mouth daily.  03/15/18 03/25/18 Yes [provider]  metoprolol succinate (TOPROL-XL) 25 MG 24 hr tablet TAKE 1 TABLET(25 MG) BY MOUTH DAILY 11/12/17  Yes Heber Gladstone, Erik C, DO  promethazine (PHENERGAN) 25 MG tablet TAKE 1 TABLET BY MOUTH  EVERY 8 HOURS AS NEEDED FOR NAUSEA OR VOMITING 01/11/18  Yes Aldine Contes, MD  SIMBRINZA 1-0.2 % SUSP Place 1 drop into  both eyes 2 (two) times daily.  10/27/12  Yes [provider]  VICTOZA 18 MG/3ML SOPN INJECT 1.8 MG ONCE DAILY AT [place] Patient  taking differently: Inject 1.8 mg into the skin at bedtime. Inject 1.8 mg once daily at the same time 01/19/18  Yes Elayne Snare, MD  ACCU-CHEK AVIVA PLUS test strip USE TO CHECK BLOOD FIVE TIMES DAILY 11/09/17   Aldine Contes, MD  ACCU-CHEK FASTCLIX LANCETS MISC Check blood sugar 5 times a day 11/11/16   Aldine Contes, MD  becaplermin (REGRANEX) 0.01 % gel Apply 1 application topically daily. Patient not taking: Reported on 03/17/2018 05/14/17   Landis Martins, DPM  Blood Glucose Monitoring Suppl (ACCU-CHEK AVIVA PLUS) w/Device KIT CHECK BLOOD SUGAR 5 TIMES A DAY 05/25/17   Aldine Contes, MD  Continuous Blood Gluc Sensor (FREESTYLE LIBRE 14 DAY SENSOR) MISC 1 each by Does not apply route 6 (six) times daily. 08/03/17   Aldine Contes, MD  HUMULIN R 500 UNIT/ML injection DRAW INSULIN TO THE 68 UNIT LINE ON THE U-100 SYRINGE(TO USE 340 UNITS) PER DAY AS DIRECTED Patient not taking: Reported on 03/17/2018 03/16/18   Elayne Snare, MD  HUMULIN R U-500 KWIKPEN 500 UNIT/ML kwikpen INJECT 35 UNITS AT BREAKFAST, 60 UNITS AT LUNCH AND 70 UNITS AT [facility] Patient taking differently: Inject 35-65 Units into the skin 2 (two) times daily with a meal. Only if pump runs out 07/29/17   Elayne Snare, MD  silver sulfADIAZINE (SILVADENE) 1 % cream Apply 1 application topically daily. Patient not taking: Reported on 03/17/2018 09/03/16   Landis Martins, DPM     Allergies:     Allergies  Allergen Reactions  . Vancomycin     REACTION: ARF     Physical Exam:   Vitals  Blood pressure (!) 157/92, pulse 85, temperature 97.6 F (36.4 C), temperature source Oral, resp. rate 20, height '6\' 6"'$  (1.981 m), weight (!) 183.8 kg, SpO2 100 %.  Physical Examination: General appearance - alert, obese appearing, and in no distress a Mental status - alert, oriented to person, place, and time,  Eyes - sclera anicteric Neck - supple, no JVD elevation , Chest - clear  to auscultation bilaterally, symmetrical air movement,    Heart - S1 and S2 normal, regular Abdomen - soft, nontender, nondistended, increased truncal adiposity Neurological - screening mental status exam normal, neck supple without rigidity, cranial nerves II through XII intact, DTR's normal and symmetric Extremities -left foot with missing second and third toes, open wound noted, slight drainage, significant swelling warmth tenderness and slight redness of the left foot extending to the ankle.  No streaking      Data Review:    CBC Recent Labs  Lab 03/17/18 1006  WBC 8.5  HGB 11.4*  HCT 35.0*  PLT 286  MCV 87.9  MCH 28.6  MCHC 32.6  RDW 14.9  LYMPHSABS 1.6  MONOABS 0.6  EOSABS 0.5  BASOSABS 0.0   ------------------------------------------------------------------------------------------------------------------  Chemistries  Recent Labs  Lab 03/17/18 1006  NA 140  K 4.6  CL 105  CO2 25  GLUCOSE 152*  BUN 24*  CREATININE 1.91*  CALCIUM 9.3   ------------------------------------------------------------------------------------------------------------------ estimated creatinine clearance is 67.2 mL/min (A) (by C-G formula based on SCr of 1.91 mg/dL (H)). ------------------------------------------------------------------------------------------------------------------ No results for input(s): TSH, T4TOTAL, T3FREE, THYROIDAB in the last 72 hours.  Invalid input(s): FREET3   Coagulation profile No results for input(s): INR, PROTIME in the last 168 hours. ------------------------------------------------------------------------------------------------------------------- No results for input(s): DDIMER  in the last 72 hours. -------------------------------------------------------------------------------------------------------------------  Cardiac Enzymes No results for input(s): CKMB, TROPONINI, MYOGLOBIN in the last 168 hours.  Invalid input(s):  CK ------------------------------------------------------------------------------------------------------------------ No results found for: BNP   ---------------------------------------------------------------------------------------------------------------  Urinalysis    Component Value Date/Time   COLORURINE YELLOW 05/17/2015 1025   APPEARANCEUR CLEAR 05/17/2015 1025   LABSPEC >=1.030 (A) 05/17/2015 1025   PHURINE 5.5 05/17/2015 1025   GLUCOSEU NEGATIVE 05/17/2015 1025   HGBUR MODERATE (A) 05/17/2015 1025   BILIRUBINUR NEGATIVE 05/17/2015 1025   KETONESUR NEGATIVE 05/17/2015 1025   PROTEINUR NEGATIVE 12/13/2014 2120   UROBILINOGEN 0.2 05/17/2015 1025   NITRITE NEGATIVE 05/17/2015 1025   LEUKOCYTESUR NEGATIVE 05/17/2015 1025    ----------------------------------------------------------------------------------------------------------------   Imaging Results:    No results found.  Radiological Exams on Admission: No results found.  DVT Prophylaxis -SCD /Apixaban  AM Labs Ordered, also please review Full Orders  Family Communication: Admission, patients condition and plan of care including tests being ordered have been discussed with the patient  who indicate understanding and agree with the plan   Code Status - Full Code  Likely DC to  Home   Condition   stable  Roxan Hockey M.D on 03/17/2018 at 7:44 PM Pager---[phone] Go to [email or web address] - password [email or web address] for contact info  [facility] - Office  [phone]

## 2018-03-17 NOTE — Care Management Note (Signed)
Case Management Note  CM contacted by Ronalee Belts with [facility] to advise pt it active with their [facility] services.  He is aware pt is being admitted and will follow for needs.  Riannah Stagner, Benjaman Lobe, RN 03/17/2018, 1:39 PM

## 2018-03-17 NOTE — Consult Note (Signed)
Reason for Consult:swollen and red l foot Referring Physician: hospitalists  [name] is an 67 y.o. male.  HPI: 66 yo male with history of osteo and amputations of left toes who presented to the wound center with red and swollen foot.  He was admitted for concerns of osteo with bone biopsy positive recently. Wc are consulted for management of wounds of foot and possible osteo.  Past Medical History:  Diagnosis Date  . Arthritis    "elbows & knees" (12/13/2014)  . Asthma   . CKD (chronic kidney disease), stage III ([place])   . DIABETIC FOOT ULCER 06/20/2009  . Edema, macular, due to secondary diabetes ([place])   . Erectile dysfunction   . GERD (gastroesophageal reflux disease)   . HEARING LOSS, SENSORINEURAL, BILATERAL 01/03/2007   Seen by ENT Dr. Orpah Greek D. Redmond Baseman 01/03/07  . Hemorrhoids   . History of echocardiogram    a. 04/2008 Echo: EF 50-55%, abnl LV relaxation, mildly dil LA.  Marland Kitchen Hyperlipidemia   . Hypertension   . Morbid obesity ([place])   . Neuropathy, lower extremity   . OSA (obstructive sleep apnea)    uses CPAP nightly  . OSA on CPAP    Nocturnal polysomnogram on 01/21/2010 showed severe obstructive sleep apnea/hypopnea syndrome, AHI 74.1 per hour with non positional events, moderately loud snoring, and oxygen desaturation to a nadir of 78% on room air.  CPAP was successfully titrated to 17 CWP, AHI 1.1 per hour using a large ResMed Mirage Quattro full-face mask with heated humidifier. Bruxism was noted.   . Osteomyelitis of ankle and foot ([place])   . Retinopathy   . Type II diabetes mellitus (HCC)    w/complication NOS, type II    Past Surgical History:  Procedure Laterality Date  . AMPUTATION Left 12/15/2014   Procedure: LEFT SECOND TOE AMPUTATION ;  Surgeon: Dorna Leitz, MD;  Location: [place];  Service: Orthopedics;  Laterality: Left;  . 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 Kathalene Frames. Mayer Camel.   . WOUND DEBRIDEMENT Left 06/15/2016   Procedure: DEBRIDEMENT WOUND LEFT FOOT WITH GRAFT APPLICATION;  Surgeon: Landis Martins, DPM;  Location: [place];  Service: Podiatry;  Laterality: Left;    Family History  Problem Relation Age of Onset  . Diabetes Mother        also 2 siblings  . Heart attack Father 98  . Throat cancer Brother     Social History:  reports that he quit smoking about 8 years ago. His smoking use included cigarettes. He has a 15.00 pack-year smoking history. He has never used smokeless tobacco. He reports that he drinks about 11.0 standard drinks of alcohol per week. He reports that he does not use drugs.  Allergies:  Allergies  Allergen Reactions  . Vancomycin     REACTION: ARF    Medications: I have reviewed the patient's current medications.  Results for orders placed or performed during the hospital encounter of 03/17/18 (from the past 48 hour(s))  Basic metabolic panel     Status: Abnormal   Collection Time: 03/17/18 10:06 AM  Result Value Ref Range   Sodium 140 135 - 145 mmol/L   Potassium 4.6 3.5 - 5.1 mmol/L   Chloride 105 98 - 111 mmol/L   CO2 25 22 - 32 mmol/L   Glucose, Bld 152 (H) 70 - 99 mg/dL   BUN 24 (H) 8 - 23 mg/dL  Creatinine, Ser 1.91 (H) 0.61 - 1.24 mg/dL   Calcium 9.3 8.9 - 10.3 mg/dL   GFR calc non Af Amer 34 (L) >60 mL/min   GFR calc Af Amer 40 (L) >60 mL/min    Comment: (NOTE) The eGFR has been calculated using the CKD EPI equation. This calculation has not been validated in all clinical situations. eGFR's persistently <60 mL/min signify possible Chronic Kidney Disease.    Anion gap 10 5 - 15    Comment: Performed at [facility], [place] [address], [place], [place] [address]  CBC with Differential     Status: Abnormal   Collection Time: 03/17/18 10:06 AM  Result Value Ref Range   WBC 8.5 4.0 - 10.5 K/uL   RBC 3.98 (L) 4.22 - 5.81 MIL/uL   Hemoglobin 11.4 (L) 13.0 - 17.0 g/dL   HCT 35.0  (L) 39.0 - 52.0 %   MCV 87.9 78.0 - 100.0 fL   MCH 28.6 26.0 - 34.0 pg   MCHC 32.6 30.0 - 36.0 g/dL   RDW 14.9 11.5 - 15.5 %   Platelets 286 150 - 400 K/uL   Neutrophils Relative % 70 %   Neutro Abs 5.9 1.7 - 7.7 K/uL   Lymphocytes Relative 18 %   Lymphs Abs 1.6 0.7 - 4.0 K/uL   Monocytes Relative 7 %   Monocytes Absolute 0.6 0.1 - 1.0 K/uL   Eosinophils Relative 5 %   Eosinophils Absolute 0.5 0.0 - 0.7 K/uL   Basophils Relative 0 %   Basophils Absolute 0.0 0.0 - 0.1 K/uL    Comment: Performed at [facility], [place] [address] [place], [place] [address]  I-Stat CG4 Lactic Acid, ED     Status: None   Collection Time: 03/17/18 10:14 AM  Result Value Ref Range   Lactic Acid, Venous 1.10 0.5 - 1.9 mmol/L  Glucose, capillary     Status: Abnormal   Collection Time: 03/17/18  4:03 PM  Result Value Ref Range   Glucose-Capillary 185 (H) 70 - 99 mg/dL   *Note: Due to a large number of results and/or encounters for the requested time period, some results have not been displayed. A complete set of results can be found in Results Review.    No results found.  ROS  ROS: I have reviewed the patient's review of systems thoroughly and there are no positive responses as relates to the HPI. Blood pressure (!) 157/92, pulse 85, temperature 97.6 F (36.4 C), temperature source Oral, resp. rate 20, height 6' 6" (1.981 m), weight (!) 183.8 kg, SpO2 100 %. Physical Exam Well-developed well-nourished patient in no acute distress. Alert and oriented x3 HEENT:within normal limits Cardiac: Regular rate and rhythm Pulmonary: Lungs clear to auscultation Abdomen: Soft and nontender.  Normal active bowel sounds  Musculoskeletal: (l foot swollen and red with small drainage from distal and plantar wounds Assessment/Plan: 67 yo male with history of osteo and amputations with bone biopsy + and open wounds with swelling and some drainage but no constitutional signs of infection.?? Pt needs  IV abx and likely MRI of foot to see extent of osteo.  Will be +at site of biopsy for sure so need to see extent from there.  May need surgery if MRI decidely + over large area otherwise may just need IV ABX and compression to help with STS of leg. Will follow  In hospital.  Alta Corning 03/17/2018, 6:49 PM

## 2018-03-17 NOTE — ED Provider Notes (Signed)
[facility] DEPT Provider Note   CSN: [id number] Arrival date & time: 03/17/18  6387     History   Chief Complaint Chief Complaint  Patient presents with  . Leg Swelling  . Wound Infection    HPI [name] is a 67 y.o. male with history of CKD, diabetes, GERD, [name], HTN, morbid obesity, OSA presents for evaluation of acute onset, progressively worsening left foot wound and left lower extremity edema.  7 days ago he followed up with his wound care physician Dr. Dellia Nims and had a cast removed for known foot ulcer overlying the the plantar aspect of head of the left third metatarsal.  At that time, his physician noted his wound had worsened and tracked all the way down to the bone.  He obtained a sample of the bone sent for culture to evaluate for osteomyelitis.  Cultures came back positive for Proteus mirabilis, Pseudomonas aeruginosa, and MRSA.  He has been on Levaquin and doxycycline for 2 days.  He notes subjective fevers and chills, nausea, decreased appetite. Notes yellow drainage from the wound. Has not taken his temperature at home, denies vomiting, abdominal pain, chest pain, or shortness of breath.  He also notes over the past 2 days he has had progressively worsening swelling of the left lower extremity.  Denies any significant pain.  Sent from his wound care facility to the ED for IV antibiotics and management of osteomyelitis.  The history is provided by the patient.    Past Medical History:  Diagnosis Date  . Arthritis    "elbows & knees" (12/13/2014)  . Asthma   . CKD (chronic kidney disease), stage III ([place])   . DIABETIC FOOT ULCER 06/20/2009  . Edema, macular, due to secondary diabetes ([place])   . Erectile dysfunction   . GERD (gastroesophageal reflux disease)   . HEARING LOSS, SENSORINEURAL, BILATERAL 01/03/2007   Seen by ENT Dr. Orpah Greek D. Redmond Baseman 01/03/07  . Hemorrhoids   . History of echocardiogram    a. 04/2008 Echo: EF 50-55%, abnl LV  relaxation, mildly dil LA.  Marland Kitchen Hyperlipidemia   . Hypertension   . Morbid obesity ([place])   . Neuropathy, lower extremity   . OSA (obstructive sleep apnea)    uses CPAP nightly  . OSA on CPAP    Nocturnal polysomnogram on 01/21/2010 showed severe obstructive sleep apnea/hypopnea syndrome, AHI 74.1 per hour with non positional events, moderately loud snoring, and oxygen desaturation to a nadir of 78% on room air.  CPAP was successfully titrated to 17 CWP, AHI 1.1 per hour using a large ResMed Mirage Quattro full-face mask with heated humidifier. Bruxism was noted.   . Osteomyelitis of ankle and foot ([place])   . Retinopathy   . Type II diabetes mellitus ([place])    w/complication NOS, type II    Patient Active Problem List   Diagnosis Date Noted  . Cellulitis and abscess of foot 03/17/2018  . Lesion of skin of left ear 11/30/2017  . Nausea 08/31/2017  . Chronic pain syndrome 09/18/2015  . Preventative health care 03/12/2015  . Foot ulcer, left ([place]) 12/13/2014  . Paroxysmal atrial fibrillation ([place]) 12/13/2014  . Vitamin D deficiency 11/17/2013  . Anemia 02/10/2012  . Hypertension 11/18/2011  . Obstructive sleep apnea 12/12/2009  . GERD 06/27/2008  . Diabetes type 2, uncontrolled ([place]) 12/28/2007  . DIABETIC MACULAR EDEMA 10/07/2007  . HEARING LOSS, SENSORINEURAL, BILATERAL 01/03/2007  . Diabetic peripheral neuropathy associated with type 2 diabetes mellitus ([place])  12/16/2006  . Chronic kidney disease, stage III (moderate) ([place]) 12/01/2006  . Morbid obesity ([place]) 08/23/2006  . STATUS, OTHER TOE(S) AMPUTATION 05/21/2006    Past Surgical History:  Procedure Laterality Date  . AMPUTATION Left 12/15/2014   Procedure: LEFT SECOND TOE AMPUTATION ;  Surgeon: Dorna Leitz, MD;  Location: [facility];  Service: Orthopedics;  Laterality: Left;  . 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 Kathalene Frames. [facility] Camel.  . WOUND DEBRIDEMENT  Left 06/15/2016   Procedure: DEBRIDEMENT WOUND LEFT FOOT WITH GRAFT APPLICATION;  Surgeon: Landis Martins, DPM;  Location: [facility];  Service: Podiatry;  Laterality: Left;        Home Medications    Prior to Admission medications   Medication Sig Start Date End Date Taking? Authorizing Provider  albuterol (PROAIR HFA) 108 (90 Base) MCG/ACT inhaler Inhale 2 puffs into the lungs every 6 (six) hours as needed for wheezing or shortness of breath. 06/16/17  Yes Aldine Contes, MD  atorvastatin (LIPITOR) 20 MG tablet TAKE 1 TABLET(20 MG) BY MOUTH DAILY 01/11/18  Yes Narendra, Nischal, MD  AZOPT 1 % ophthalmic suspension Place 1 drop into both eyes 2 (two) times daily.  02/24/18  Yes [provider]  Cholecalciferol (VITAMIN D3) 2000 UNITS capsule Take 1 capsule (2,000 Units total) by mouth daily. 09/21/14  Yes Bertha Stakes, MD  diclofenac sodium (VOLTAREN) 1 % GEL Apply 4 g topically 4 (four) times daily. 12/28/16  Yes Bartholomew Crews, MD  docusate sodium (COLACE) 100 MG capsule Take 1 capsule (100 mg total) by mouth daily as needed for mild constipation. 12/28/16  Yes Bartholomew Crews, MD  doxycycline (MONODOX) 100 MG capsule Take 100 mg by mouth 2 (two) times daily.  03/15/18 03/25/18 Yes [provider]  ELIQUIS 5 MG TABS tablet TAKE 1 TABLET(5 MG) BY MOUTH TWICE DAILY Patient taking differently: Take 5 mg by mouth 2 (two) times daily.  12/20/17  Yes Aldine Contes, MD  enalapril (VASOTEC) 20 MG tablet TAKE 2 TABLETS(40 MG) BY MOUTH DAILY Patient taking differently: Take 40 mg by mouth daily.  02/22/18  Yes Aldine Contes, MD  furosemide (LASIX) 40 MG tablet TAKE 1 TABLET(40 MG) BY MOUTH TWICE DAILY Patient taking differently: Take 40 mg by mouth 2 (two) times daily.  12/20/17  Yes Aldine Contes, MD  gabapentin (NEURONTIN) 300 MG capsule TAKE 1 CAPSULE(300 MG) BY MOUTH AT BEDTIME 01/11/18  Yes Narendra, Nischal, MD  HUMULIN R 500 UNIT/ML injection DRAW  INSULIN TO THE 68 UNIT LINE ON THE U-100 SYRINGE(TO USE 340 UNITS) PER DAY AS DIRECTED Patient taking differently: Inject 340 Units into the skin continuous.  09/14/17  Yes Elayne Snare, MD  HYDROcodone-acetaminophen (NORCO) 7.5-325 MG tablet Take 1 tablet by mouth every 6 (six) hours as needed for moderate pain. 10/28/16 03/17/18 Yes Aldine Contes, MD  Insulin Disposable Pump (V-GO 20) KIT USE 1 PUMP DAILY WITH INSULIN Patient taking differently: 1 application by Continuous infusion (non-IV) route daily. USE 1 PUMP DAILY WITH INSULIN 02/16/18  Yes Elayne Snare, MD  latanoprost (XALATAN) 0.005 % ophthalmic solution Place 1 drop into both eyes at bedtime.  03/10/18  Yes [provider]  levofloxacin (LEVAQUIN) 500 MG tablet Take 500 mg by mouth daily.  03/15/18 03/25/18 Yes [provider]  metoprolol succinate (TOPROL-XL) 25 MG 24 hr tablet TAKE 1 TABLET(25 MG) BY MOUTH DAILY 11/12/17  Yes Lucious Groves, DO  promethazine (PHENERGAN) 25 MG tablet TAKE 1 TABLET BY MOUTH  EVERY 8 HOURS AS NEEDED FOR NAUSEA OR VOMITING 01/11/18  Yes Narendra, Nischal, MD  SIMBRINZA 1-0.2 % SUSP Place 1 drop into both eyes 2 (two) times daily.  10/27/12  Yes [provider]  VICTOZA 18 MG/3ML SOPN INJECT 1.8 MG ONCE DAILY AT [place] Patient taking differently: Inject 1.8 mg into the skin at bedtime. Inject 1.8 mg once daily at the same time 01/19/18  Yes Elayne Snare, MD  ACCU-CHEK AVIVA PLUS test strip USE TO CHECK BLOOD FIVE TIMES DAILY 11/09/17   Aldine Contes, MD  ACCU-CHEK FASTCLIX LANCETS MISC Check blood sugar 5 times a day 11/11/16   Aldine Contes, MD  becaplermin (REGRANEX) 0.01 % gel Apply 1 application topically daily. Patient not taking: Reported on 03/17/2018 05/14/17   Landis Martins, DPM  Blood Glucose Monitoring Suppl (ACCU-CHEK AVIVA PLUS) w/Device KIT CHECK BLOOD SUGAR 5 TIMES A DAY 05/25/17   Aldine Contes, MD  Continuous Blood Gluc Sensor (FREESTYLE LIBRE 14 DAY SENSOR) MISC  1 each by Does not apply route 6 (six) times daily. 08/03/17   Aldine Contes, MD  HUMULIN R 500 UNIT/ML injection DRAW INSULIN TO THE 68 UNIT LINE ON THE U-100 SYRINGE(TO USE 340 UNITS) PER DAY AS DIRECTED Patient not taking: Reported on 03/17/2018 03/16/18   Elayne Snare, MD  HUMULIN R U-500 KWIKPEN 500 UNIT/ML kwikpen INJECT 35 UNITS AT BREAKFAST, 60 UNITS AT LUNCH AND 70 UNITS AT [facility] Patient taking differently: Inject 35-65 Units into the skin 2 (two) times daily with a meal. Only if pump runs out 07/29/17   Elayne Snare, MD  silver sulfADIAZINE (SILVADENE) 1 % cream Apply 1 application topically daily. Patient not taking: Reported on 03/17/2018 09/03/16   Landis Martins, DPM    Family History Family History  Problem Relation Age of Onset  . Diabetes Mother        also 2 siblings  . Heart attack Father 46  . Throat cancer Brother     Social History Social History   Tobacco Use  . Smoking status: Former Smoker    Packs/day: 0.50    Years: 30.00    Pack years: 15.00    Types: Cigarettes    Last attempt to quit: 02/10/2010    Years since quitting: 8.1  . Smokeless tobacco: Never Used  Substance Use Topics  . Alcohol use: Yes    Alcohol/week: 11.0 standard drinks    Types: 4 Cans of beer, 3 Shots of liquor, 4 Standard drinks or equivalent per week    Comment: 12/13/2014 "I only drink on the weekend; usually only on Saturday" - beer and liquor  . Drug use: No     Allergies   Vancomycin   Review of Systems Review of Systems  Constitutional: Positive for appetite change, chills and fever.  Respiratory: Negative for shortness of breath.   Cardiovascular: Positive for leg swelling. Negative for chest pain.  Gastrointestinal: Positive for nausea. Negative for abdominal pain and vomiting.  Skin: Positive for wound.  Neurological: Positive for numbness (baseline).  All other systems reviewed and are negative.    Physical Exam Updated Vital Signs BP (!) 157/92   Pulse 85    Temp 97.6 F (36.4 C) (Oral)   Resp 20   Wt (!) 183.8 kg   SpO2 100%   BMI 49.33 kg/m   Physical Exam  Constitutional: He appears well-developed and well-nourished. No distress.  HENT:  Head: Normocephalic  and atraumatic.  Eyes: Conjunctivae are normal. Right eye exhibits no discharge. Left eye exhibits no discharge.  Neck: No JVD present. No tracheal deviation present.  Cardiovascular: Normal rate, regular rhythm, normal heart sounds and intact distal pulses.  2+ DP/PT pulses bilaterally, 3+ pitting edema of the left lower extremity.  Homans sign absent bilaterally.  Pulmonary/Chest: Effort normal and breath sounds normal. No stridor. No respiratory distress. He has no wheezes. He has no rales. He exhibits no tenderness.  Abdominal: Soft. Bowel sounds are normal. He exhibits no distension. There is no tenderness. There is no guarding.  Musculoskeletal: He exhibits no edema.  Left second and third toes surgically absent.  Normal range of motion of the bilateral lower extremities.  No crepitus noted.  Neurological: He is alert.  Skin: Skin is warm and dry. No erythema.  See below image.  2 x 3 cm ulceration to the plantar aspect of the left third metatarsal head.  Oozing purulent fluid.  The skin around the toes appears macerated.  No tenderness to palpation.  Psychiatric: He has a normal mood and affect. His behavior is normal.  Nursing note and vitals reviewed.    ED Treatments / Results  Labs (all labs ordered are listed, but only abnormal results are displayed) Labs Reviewed  BASIC METABOLIC PANEL - Abnormal; Notable for the following components:      Result Value   Glucose, Bld 152 (*)    BUN 24 (*)    Creatinine, Ser 1.91 (*)    GFR calc non Af Amer 34 (*)    GFR calc Af Amer 40 (*)    All other components within normal limits  CBC WITH DIFFERENTIAL/PLATELET - Abnormal; Notable for the following components:   RBC 3.98 (*)    Hemoglobin 11.4 (*)    HCT 35.0 (*)    All  other components within normal limits  HIV ANTIBODY (ROUTINE TESTING)  I-STAT CG4 LACTIC ACID, ED    EKG None  Radiology No results found.    Procedures Procedures (including critical care time)  Medications Ordered in ED Medications  metroNIDAZOLE (FLAGYL) IVPB 500 mg (0 mg Intravenous Stopped 03/17/18 1132)  cholecalciferol (VITAMIN D) tablet 2,000 Units (has no administration in time range)  metoprolol succinate (TOPROL-XL) 24 hr tablet 25 mg (has no administration in time range)  apixaban (ELIQUIS) tablet 5 mg (has no administration in time range)  atorvastatin (LIPITOR) tablet 20 mg (has no administration in time range)  gabapentin (NEURONTIN) capsule 300 mg (has no administration in time range)  latanoprost (XALATAN) 0.005 % ophthalmic solution 1 drop (has no administration in time range)  brinzolamide (AZOPT) 1 % ophthalmic suspension 1 drop (has no administration in time range)  sodium chloride flush (NS) 0.9 % injection 3 mL (has no administration in time range)  sodium chloride flush (NS) 0.9 % injection 3 mL (has no administration in time range)  0.9 %  sodium chloride infusion (has no administration in time range)  acetaminophen (TYLENOL) tablet 650 mg (has no administration in time range)    Or  acetaminophen (TYLENOL) suppository 650 mg (has no administration in time range)  traZODone (DESYREL) tablet 50 mg (has no administration in time range)  polyethylene glycol (MIRALAX / GLYCOLAX) packet 17 g (has no administration in time range)  ondansetron (ZOFRAN) tablet 4 mg (has no administration in time range)    Or  ondansetron (ZOFRAN) injection 4 mg (has no administration in time range)  albuterol (PROVENTIL) (2.5 MG/3ML) 0.083%  nebulizer solution 2.5 mg (has no administration in time range)  hydrALAZINE (APRESOLINE) injection 10 mg (has no administration in time range)  vancomycin (VANCOCIN) 1,500 mg in sodium chloride 0.9 % 500 mL IVPB (has no administration in time  range)  ceFEPIme (MAXIPIME) 2 g in sodium chloride 0.9 % 100 mL IVPB (has no administration in time range)  vancomycin (VANCOCIN) 1,750 mg in sodium chloride 0.9 % 500 mL IVPB (has no administration in time range)  insulin pump (has no administration in time range)  ceFEPIme (MAXIPIME) 2 g in sodium chloride 0.9 % 100 mL IVPB (0 g Intravenous Stopped 03/17/18 1236)  vancomycin (VANCOCIN) IVPB 1000 mg/200 mL premix (1,000 mg Intravenous New Bag/Given 03/17/18 1235)  sodium chloride 0.9 % bolus 1,000 mL ( Intravenous Restarted 03/17/18 1142)     Initial Impression / Assessment and Plan / ED Course  I have reviewed the triage vital signs and the nursing notes.  Pertinent labs & imaging results that were available during my care of the patient were reviewed by me and considered in my medical decision making (see chart for details).  Clinical Course as of Mar 17 1512  Thu Mar 17, 7364  1610 67 year old male with history of osteo-here with worsening left foot swelling in the setting of 2 days of oral antibiotics.  His doctor sent him here for admission for IV antibiotics.  He is feeling a little nauseous.  He is got some leg swelling and some erythema.  He will need to be admitted for IV antibiotics and rechecking a duplex study and lab work.   [MB]    Clinical Course User Index [MB] Hayden Rasmussen, MD    Patient presents to the ED at the request of his wound care physician for management of osteomyelitis.  He is afebrile, hypertensive and intermittently tachycardic in the ED.  Nontoxic in appearance.  Peripheral neuropathy at patient's baseline.  He is significant swelling of the left lower extremity compared to the right.  I spoke with his wound care physician Dr. Dellia Nims on the phone who states the patient's condition regarding the wound has significantly worsened since he saw him 1 week ago and is "night and day" compared to his initial presentation 2 weeks ago.  He feels strongly that the patient  would benefit from IV antibiotics and close follow-up given he may require surgery.  Lab work reviewed by me is essentially at the patient's baseline, no leukocytosis.  No metabolic derangements.  Compartments are soft.  DVT study performed was negative for DVT.  IV antibiotics initiated and pharmacy was consulted.  Patient does not appear to be floridly septic at this time in the absence of fever or leukocytosis.  Spoke with Dr. Denton Brick with [facility] hospitalist service who agrees to assume care of patient and bring him into the hospital for further evaluation and management.  Patient seen and evaluated by Dr. Melina Copa who agrees with assessment and plan at this time. 11:59 AM Spoke with Dr. Berenice Primas with orthopedic surgery who agrees to consult and will follow the patient while admitted.  Final Clinical Impressions(s) / ED Diagnoses   Final diagnoses:  Osteomyelitis of left foot, unspecified type [facility])    ED Discharge Orders    None       Debroah Baller 03/17/18 1520    Hayden Rasmussen, MD 03/17/18 [phone]

## 2018-03-17 NOTE — ED Notes (Signed)
Pt provided with ravioli, [place] sandwich and diet ginger ale.

## 2018-03-17 NOTE — Progress Notes (Addendum)
Pharmacy Antibiotic Note  [name] is a 67 y.o. male admitted on 03/17/2018  with chronic DM foot wound. Seen at wound clinic for biopsy 7d PTA, sample of the bone sent for culture to evaluate for osteomyelitis.  Cultures from 8/29 came back positive for Proteus mirabilis, Pseudomonas aeruginosa (both sensitive to Cefepime), and MRSA.  He has been on Levaquin and doxycycline for 2 days. He notes subjective fevers and chills, nausea,   Pharmacy has been consulted for Vancomycin & Cefepime dosing.  Plan: Cefepime 2gm IV q8hr Vancomycin loading dose 1gm + 1500mg , followed by 1750mg  q24 Flagyl 500mg  IV q8 per MD - Daily Serum Creatinine    Temp (24hrs), Avg:98.3 F (36.8 C), Min:98.2 F (36.8 C), Max:98.3 F (36.8 C)  Recent Labs  Lab 03/17/18 1006 03/17/18 1014  WBC 8.5  --   CREATININE 1.91*  --   LATICACIDVEN  --  1.10    CrCl cannot be calculated (Unknown ideal weight.).    Allergies  Allergen Reactions  . Vancomycin     REACTION: ARF   Antimicrobials this admission: 9/5 Cefepime >>  9/5/ Vancomycin >>  9/5 Flagyl >>  Dose adjustments this admission:  Microbiology results: 8/29 bone sample: Pseudomonas, Proteus, MRSA  Thank you for allowing pharmacy to be a part of this patient's care.  Minda Ditto PharmD Pager [phone] 03/17/2018, 2:11 PM

## 2018-03-17 NOTE — ED Notes (Signed)
Vascular at bedside

## 2018-03-17 NOTE — Progress Notes (Signed)
*  Preliminary Results* Left lower extremity venous duplex completed. Left lower extremity is negative for deep vein thrombosis. There is no evidence of left Baker's cyst.  03/17/2018 10:41 AM  [name]

## 2018-03-17 NOTE — ED Triage Notes (Addendum)
Pt reports having biopsy of foot 7 days ago.  Reports going to doctors office this morning for concern of infection. Also notes new onset of leg swelling x3 days. Pt reports wound has been chronic issue for multiple years, routinely goes to wound clinic.

## 2018-03-17 NOTE — ED Notes (Signed)
ED TO INPATIENT HANDOFF REPORT  Name/Age/Gender [name] 67 y.o. male  Code Status Code Status History    Date Active Date Inactive Code Status Order ID Comments User Context   12/13/2014 1741 12/16/2014 1840 Full Code [id number]  Jones Bales, MD Inpatient      Home/SNF/Other Home  Chief Complaint WOUND ISSUES   Level of Care/Admitting Diagnosis ED Disposition    ED Disposition Condition [facility] Area: [facility] [[id number]]  Level of Care: Med-Surg [16]  Diagnosis: Cellulitis and abscess of foot [[id number]]  Admitting Physician: Morrison Old  Attending Physician: Morrison Old  Estimated length of stay: 3 - 4 days  Certification:: I certify this patient will need inpatient services for at least 2 midnights  Bed request comments: med surg  PT Class (Do Not Modify): Inpatient [101]  PT Acc Code (Do Not Modify): Private [1]       Medical History Past Medical History:  Diagnosis Date  . Arthritis    "elbows & knees" (12/13/2014)  . Asthma   . CKD (chronic kidney disease), stage III ([place])   . DIABETIC FOOT ULCER 06/20/2009  . Edema, macular, due to secondary diabetes ([place])   . Erectile dysfunction   . GERD (gastroesophageal reflux disease)   . HEARING LOSS, SENSORINEURAL, BILATERAL 01/03/2007   Seen by ENT Dr. Orpah Greek D. Redmond Baseman 01/03/07  . Hemorrhoids   . History of echocardiogram    a. 04/2008 Echo: EF 50-55%, abnl LV relaxation, mildly dil LA.  Marland Kitchen Hyperlipidemia   . Hypertension   . Morbid obesity ([place])   . Neuropathy, lower extremity   . OSA (obstructive sleep apnea)    uses CPAP nightly  . OSA on CPAP    Nocturnal polysomnogram on 01/21/2010 showed severe obstructive sleep apnea/hypopnea syndrome, AHI 74.1 per hour with non positional events, moderately loud snoring, and oxygen desaturation to a nadir of 78% on room air.  CPAP was successfully titrated to 17 CWP, AHI 1.1 per hour using a large  ResMed Mirage Quattro full-face mask with heated humidifier. Bruxism was noted.   . Osteomyelitis of ankle and foot ([place])   . Retinopathy   . Type II diabetes mellitus (HCC)    w/complication NOS, type II    Allergies Allergies  Allergen Reactions  . Vancomycin     REACTION: ARF    IV Location/Drains/Wounds Patient Lines/Drains/Airways Status   Active Line/Drains/Airways    Name:   Placement date:   Placement time:   Site:   Days:   Peripheral IV 03/17/18 Right Antecubital   03/17/18    1017    Antecubital   less than 1   Incision (Closed) 12/15/14 Foot Left   12/15/14    1430     1188   Incision (Closed) 06/15/16 Foot Left   06/15/16    1340     640          Labs/Imaging Results for orders placed or performed during the hospital encounter of 03/17/18 (from the past 48 hour(s))  Basic metabolic panel     Status: Abnormal   Collection Time: 03/17/18 10:06 AM  Result Value Ref Range   Sodium 140 135 - 145 mmol/L   Potassium 4.6 3.5 - 5.1 mmol/L   Chloride 105 98 - 111 mmol/L   CO2 25 22 - 32 mmol/L   Glucose, Bld 152 (H) 70 - 99 mg/dL   BUN 24 (H) 8 - 23 mg/dL  Creatinine, Ser 1.91 (H) 0.61 - 1.24 mg/dL   Calcium 9.3 8.9 - 10.3 mg/dL   GFR calc non Af Amer 34 (L) >60 mL/min   GFR calc Af Amer 40 (L) >60 mL/min    Comment: (NOTE) The eGFR has been calculated using the CKD EPI equation. This calculation has not been validated in all clinical situations. eGFR's persistently <60 mL/min signify possible Chronic Kidney Disease.    Anion gap 10 5 - 15    Comment: Performed at [facility], [place] [address] [place], [place] [address]  CBC with Differential     Status: Abnormal   Collection Time: 03/17/18 10:06 AM  Result Value Ref Range   WBC 8.5 4.0 - 10.5 K/uL   RBC 3.98 (L) 4.22 - 5.81 MIL/uL   Hemoglobin 11.4 (L) 13.0 - 17.0 g/dL   HCT 35.0 (L) 39.0 - 52.0 %   MCV 87.9 78.0 - 100.0 fL   MCH 28.6 26.0 - 34.0 pg   MCHC 32.6 30.0 - 36.0 g/dL   RDW  14.9 11.5 - 15.5 %   Platelets 286 150 - 400 K/uL   Neutrophils Relative % 70 %   Neutro Abs 5.9 1.7 - 7.7 K/uL   Lymphocytes Relative 18 %   Lymphs Abs 1.6 0.7 - 4.0 K/uL   Monocytes Relative 7 %   Monocytes Absolute 0.6 0.1 - 1.0 K/uL   Eosinophils Relative 5 %   Eosinophils Absolute 0.5 0.0 - 0.7 K/uL   Basophils Relative 0 %   Basophils Absolute 0.0 0.0 - 0.1 K/uL    Comment: Performed at [facility], [place] [address] [place], [place] [address]  I-Stat CG4 Lactic Acid, ED     Status: None   Collection Time: 03/17/18 10:14 AM  Result Value Ref Range   Lactic Acid, Venous 1.10 0.5 - 1.9 mmol/L   *Note: Due to a large number of results and/or encounters for the requested time period, some results have not been displayed. A complete set of results can be found in Results Review.   No results found.  Pending Labs [employer] (From admission, onward)    Start     Ordered   Signed and Held  HIV antibody (Routine Testing)  Once,   R     Signed and Held   Signed and Occupational hygienist morning,   R     Signed and Held   Signed and Held  CBC  Tomorrow morning,   R     Signed and Held          Vitals/Pain Today's Vitals   03/17/18 1100 03/17/18 1200 03/17/18 1230 03/17/18 1300  BP: (!) 164/85 127/71 (!) 155/100 (!) 168/110  Pulse: 70 (!) 119 (!) 120 (!) 129  Resp: '16 16  16  '$ Temp: 98.2 F (36.8 C)     TempSrc:      SpO2: 97% 99% 100% 99%  PainSc:        Isolation Precautions No active isolations  Medications Medications  metroNIDAZOLE (FLAGYL) IVPB 500 mg (0 mg Intravenous Stopped 03/17/18 1132)  hydrALAZINE (APRESOLINE) injection 10 mg (has no administration in time range)  ceFEPIme (MAXIPIME) 2 g in sodium chloride 0.9 % 100 mL IVPB (0 g Intravenous Stopped 03/17/18 1236)  vancomycin (VANCOCIN) IVPB 1000 mg/200 mL premix (1,000 mg Intravenous New Bag/Given 03/17/18 1235)  sodium chloride 0.9 % bolus 1,000 mL ( Intravenous  Restarted 03/17/18 1142)  Mobility walks  

## 2018-03-18 ENCOUNTER — Ambulatory Visit: Payer: Medicare Other | Admitting: Family

## 2018-03-18 ENCOUNTER — Ambulatory Visit (HOSPITAL_COMMUNITY)
Admit: 2018-03-18 | Discharge: 2018-03-18 | Disposition: A | Discharge status: Home / Self Care | Payer: Medicare Other | Attending: Family Medicine | Admitting: Family Medicine

## 2018-03-18 DIAGNOSIS — I10 Essential (primary) hypertension: Secondary | ICD-10-CM

## 2018-03-18 DIAGNOSIS — E11649 Type 2 diabetes mellitus with hypoglycemia without coma: Secondary | ICD-10-CM

## 2018-03-18 DIAGNOSIS — R600 Localized edema: Secondary | ICD-10-CM | POA: Diagnosis not present

## 2018-03-18 DIAGNOSIS — N183 Chronic kidney disease, stage 3 (moderate): Secondary | ICD-10-CM

## 2018-03-18 DIAGNOSIS — M869 Osteomyelitis, unspecified: Secondary | ICD-10-CM

## 2018-03-18 LAB — BASIC METABOLIC PANEL
Anion gap: 9 (ref 5–15)
BUN: 26 mg/dL — AB (ref 8–23)
CALCIUM: 8.4 mg/dL — AB (ref 8.9–10.3)
CO2: 25 mmol/L (ref 22–32)
CREATININE: 1.86 mg/dL — AB (ref 0.61–1.24)
Chloride: 104 mmol/L (ref 98–111)
GFR calc Af Amer: 41 mL/min — ABNORMAL LOW (ref >60)
GFR, EST NON AFRICAN AMERICAN: 36 mL/min — AB (ref >60)
Glucose, Bld: 173 mg/dL — ABNORMAL HIGH (ref 70–99)
Potassium: 4.6 mmol/L (ref 3.5–5.1)
SODIUM: 138 mmol/L (ref 135–145)

## 2018-03-18 LAB — GLUCOSE, CAPILLARY
GLUCOSE-CAPILLARY: 175 mg/dL — AB (ref 70–99)
GLUCOSE-CAPILLARY: 200 mg/dL — AB (ref 70–99)
Glucose-Capillary: 267 mg/dL — ABNORMAL HIGH (ref 70–99)
Glucose-Capillary: 238 mg/dL — ABNORMAL HIGH (ref 70–99)

## 2018-03-18 LAB — CBC
HCT: 29.6 % — ABNORMAL LOW (ref 39.0–52.0)
Hemoglobin: 9.4 g/dL — ABNORMAL LOW (ref 13.0–17.0)
MCH: 28.1 pg (ref 26.0–34.0)
MCHC: 31.8 g/dL (ref 30.0–36.0)
MCV: 88.4 fL (ref 78.0–100.0)
PLATELETS: 237 10*3/uL (ref 150–400)
RBC: 3.35 MIL/uL — ABNORMAL LOW (ref 4.22–5.81)
RDW: 15.1 % (ref 11.5–15.5)
WBC: 7.7 10*3/uL (ref 4.0–10.5)

## 2018-03-18 LAB — MRSA PCR SCREENING: MRSA by PCR: NEGATIVE

## 2018-03-18 LAB — HIV ANTIBODY (ROUTINE TESTING W REFLEX): HIV SCREEN 4TH GENERATION: NONREACTIVE

## 2018-03-18 NOTE — Consult Note (Signed)
WOC consulted for left foot wound. [facility] Nurse has reviewed record and this patient has a positive xray or MRI for osteomyelitis, this is considered outside of the scope of practice for the [place] nurse, for that reason [facility] Nurse will not consult.  Dr. Berenice Primas has seen this patient and will follow in the hospital for his foot wounds.    Mikayla Chiusano [facility] MSN, [place], [place], [place], [place]

## 2018-03-18 NOTE — Progress Notes (Signed)
PROGRESS NOTE  [name] YCX:[id number] DOB: Apr 11, 1951 DOA: 03/17/2018 PCP: Aldine Contes, MD  HPI/Recap of past 24 hours: [name]  is a 67 y.o. male  with history of CKD, diabetes, GERD, [name], HTN, morbid obesity, OSA presents for evaluation of acute onset, progressively worsening left foot wound and left lower extremity edema. 7 days ago he followed up with his wound care physician Dr. Dellia Nims and had a cast removed for known foot ulcer overlying thethe plantar aspect ofhead ofthe left third metatarsal. At that time, his physician noted his wound had worsened and tracked all the way down to the bone. He obtained a sample of the bone on 03/10/18 sent for culture to evaluate for osteomyelitis. Cultures came back positive for Proteus mirabilis, Pseudomonas aeruginosa, and MRSA. He has been on Levaquin and doxycycline for 2 days.   03/18/2018: Patient seen and examined at his bedside post MRI.  States his left foot pain is well controlled on current pain medications.  Denies any dyspnea or chest pain.  Assessment/Plan: Principal Problem:   Cellulitis and abscess of foot Active Problems:   Diabetes type 2, uncontrolled ([place])   Obstructive sleep apnea   Diabetic peripheral neuropathy associated with type 2 diabetes mellitus (HCC)   Chronic kidney disease, stage III (moderate) (HCC)   Morbid obesity (HCC)   Hypertension   Foot ulcer, left (HCC)   Paroxysmal atrial fibrillation (HCC)  Protein mirabilis, Pseudomonas aeruginosa, MRSA cellulitis and osteomyelitis of the left foot MRI done at [facility] on 03/18/2018 confirmed osteomyelitis affecting the fourth metatarsal head and fourth toe complicated by abscesses in the second and third metatarsal region and cellulitis Obtain blood cultures x2 peripherally Continue IV antibiotics on IV cefepime and IV vancomycin Monitor fever curve Monitor WBC Obtain CBC in the morning  Uncontrolled type 2 diabetes with hyperglycemia Last  A1c from July 2019 revealed hemoglobin A1c of 8.0 Continue insulin sliding scale Avoid hypoglycemia to maximize wound healing  Paroxysmal A. fib Rate controlled on beta-blockers On Eliquis for CVA prophylaxis  Morbid obesity BMI 46 Weight loss outpatient PT to assess  OSA CPAP at night  AKI on CKD 3 Baseline creatinine 1.6 with GFR of 49 Creatinine on presentation 1.91 with GFR of 40 Continue to avoid nephrotoxic agents/dehydration/hypotension Creatinine level trending down from 1.91-1.86 Monitor urine output Repeat BMP in the morning  Alcohol abuse Continue CIWA protocol Continue multivitamins including folic acid and thiamine  Chronic normocytic anemia No sign of overt bleeding Hemoglobin 9.4 with baseline hemoglobin of 11 MCV 88 Repeat CBC in the morning  Chronic diastolic CHF Last 2D echo done on 12/14/2014 revealed preserved LVEF 55%, regional wall motion abnormalities could not be excluded. Strict I's and O's Daily weight  Code Status: Full code  Family Communication: None at bedside  Disposition Plan: Discharge to home in 2-3 days when clinically stable or when orthopedic surgery signs off   Consultants:  Orthopedic surgery  Procedures:  None  Antimicrobials:  IV vancomycin and IV cefepime  DVT prophylaxis: Eliquis   Objective: Vitals:   03/17/18 1430 03/17/18 1500 03/17/18 2023 03/18/18 0451  BP: (!) 157/92  115/80 116/66  Pulse: 85  (!) 116 66  Resp: 20  20 18   Temp: 97.6 F (36.4 C)  98.4 F (36.9 C)   TempSrc: Oral  Oral   SpO2: 100%  100% 99%  Weight:  (!) 183.8 kg    Height:  6\' 6"  (1.981 m)      Intake/Output Summary (Last  24 hours) at 03/18/2018 1442 Last data filed at 03/18/2018 0735 Gross per 24 hour  Intake 685.85 ml  Output 600 ml  Net 85.85 ml   Filed Weights   03/17/18 1500  Weight: (!) 183.8 kg    Exam:  . General: 67 y.o. year-old male morbidly obese in no acute distress.  Alert and oriented  x3. . Cardiovascular: Regular rate and rhythm with no rubs or gallops.  No thyromegaly or JVD noted.   Marland Kitchen Respiratory: Clear to auscultation with no wheezes or rales. Good inspiratory effort. . Abdomen: Soft nontender nondistended with normal bowel sounds x4 quadrants. . Musculoskeletal: 2+ pitting edema in lower extremities bilaterally worse on the left from toes up to knee. 2/4 pulses in all 4 extremities. . Skin: Open wound noted in plantar portion of left foot, missing toes. Marland Kitchen Psychiatry: Mood is appropriate for condition and setting   Data Reviewed: CBC: Recent Labs  Lab 03/17/18 1006 03/18/18 0621  WBC 8.5 7.7  NEUTROABS 5.9  --   HGB 11.4* 9.4*  HCT 35.0* 29.6*  MCV 87.9 88.4  PLT 286 767   Basic Metabolic Panel: Recent Labs  Lab 03/17/18 1006 03/18/18 0621  NA 140 138  K 4.6 4.6  CL 105 104  CO2 25 25  GLUCOSE 152* 173*  BUN 24* 26*  CREATININE 1.91* 1.86*  CALCIUM 9.3 8.4*   GFR: Estimated Creatinine Clearance: 69 mL/min (A) (by C-G formula based on SCr of 1.86 mg/dL (H)). Liver Function Tests: No results for input(s): AST, ALT, ALKPHOS, BILITOT, PROT, ALBUMIN in the last 168 hours. No results for input(s): LIPASE, AMYLASE in the last 168 hours. No results for input(s): AMMONIA in the last 168 hours. Coagulation Profile: No results for input(s): INR, PROTIME in the last 168 hours. Cardiac Enzymes: No results for input(s): CKTOTAL, CKMB, CKMBINDEX, TROPONINI in the last 168 hours. BNP (last 3 results) No results for input(s): PROBNP in the last 8760 hours. HbA1C: No results for input(s): HGBA1C in the last 72 hours. CBG: Recent Labs  Lab 03/17/18 1603 03/17/18 2020 03/18/18 0727 03/18/18 1126  GLUCAP 185* 79 175* 267*   Lipid Profile: No results for input(s): CHOL, HDL, LDLCALC, TRIG, CHOLHDL, LDLDIRECT in the last 72 hours. Thyroid Function Tests: No results for input(s): TSH, T4TOTAL, FREET4, T3FREE, THYROIDAB in the last 72 hours. Anemia  Panel: No results for input(s): VITAMINB12, FOLATE, FERRITIN, TIBC, IRON, RETICCTPCT in the last 72 hours. Urine analysis:    Component Value Date/Time   COLORURINE YELLOW 05/17/2015 1025   APPEARANCEUR CLEAR 05/17/2015 1025   LABSPEC >=1.030 (A) 05/17/2015 1025   PHURINE 5.5 05/17/2015 1025   GLUCOSEU NEGATIVE 05/17/2015 1025   HGBUR MODERATE (A) 05/17/2015 1025   BILIRUBINUR NEGATIVE 05/17/2015 1025   KETONESUR NEGATIVE 05/17/2015 1025   PROTEINUR NEGATIVE 12/13/2014 2120   UROBILINOGEN 0.2 05/17/2015 1025   NITRITE NEGATIVE 05/17/2015 1025   LEUKOCYTESUR NEGATIVE 05/17/2015 1025   Sepsis Labs: @LABRCNTIP (procalcitonin:4,lacticidven:4)  ) Recent Results (from the past 240 hour(s))  Aerobic/Anaerobic Culture (surgical/deep wound)     Status: None   Collection Time: 03/10/18 11:45 AM  Result Value Ref Range Status   Specimen Description   Final    BONE LEFT 3RD METATARSAL HEAD Performed at [facility] [address], [place], [place] [address]    Special Requests   Final    NONE Performed at [facility], [place] [address], [place], [place] [address]    Gram Stain  Final    FEW WBC PRESENT, PREDOMINANTLY MONONUCLEAR FEW GRAM POSITIVE COCCI    Culture   Final    MODERATE PROTEUS MIRABILIS MODERATE PSEUDOMONAS AERUGINOSA FEW METHICILLIN RESISTANT STAPHYLOCOCCUS AUREUS NO ANAEROBES ISOLATED Performed at [facility], [place] [address] [place], [place] [address]    Report Status 03/16/2018 FINAL  Final   Organism ID, Bacteria PROTEUS MIRABILIS  Final   Organism ID, Bacteria PSEUDOMONAS AERUGINOSA  Final   Organism ID, Bacteria METHICILLIN RESISTANT STAPHYLOCOCCUS AUREUS  Final      Susceptibility   Methicillin resistant staphylococcus aureus - MIC*    CIPROFLOXACIN >=8 RESISTANT Resistant     ERYTHROMYCIN <=0.25 SENSITIVE Sensitive     GENTAMICIN <=0.5 SENSITIVE Sensitive     OXACILLIN >=4 RESISTANT Resistant      TETRACYCLINE <=1 SENSITIVE Sensitive     VANCOMYCIN 1 SENSITIVE Sensitive     TRIMETH/SULFA >=320 RESISTANT Resistant     CLINDAMYCIN <=0.25 SENSITIVE Sensitive     RIFAMPIN <=0.5 SENSITIVE Sensitive     Inducible Clindamycin NEGATIVE Sensitive     * FEW METHICILLIN RESISTANT STAPHYLOCOCCUS AUREUS   Pseudomonas aeruginosa - MIC*    CEFTAZIDIME 8 SENSITIVE Sensitive     CIPROFLOXACIN <=0.25 SENSITIVE Sensitive     GENTAMICIN 2 SENSITIVE Sensitive     IMIPENEM 2 SENSITIVE Sensitive     PIP/TAZO 16 SENSITIVE Sensitive     CEFEPIME 4 SENSITIVE Sensitive     * MODERATE PSEUDOMONAS AERUGINOSA   Proteus mirabilis - MIC*    AMPICILLIN <=2 SENSITIVE Sensitive     CEFAZOLIN <=4 SENSITIVE Sensitive     CEFEPIME <=1 SENSITIVE Sensitive     CEFTAZIDIME <=1 SENSITIVE Sensitive     CEFTRIAXONE <=1 SENSITIVE Sensitive     CIPROFLOXACIN <=0.25 SENSITIVE Sensitive     GENTAMICIN <=1 SENSITIVE Sensitive     IMIPENEM 2 SENSITIVE Sensitive     TRIMETH/SULFA <=20 SENSITIVE Sensitive     AMPICILLIN/SULBACTAM <=2 SENSITIVE Sensitive     PIP/TAZO <=4 SENSITIVE Sensitive     * MODERATE PROTEUS MIRABILIS  MRSA PCR Screening     Status: None   Collection Time: 03/18/18 11:13 AM  Result Value Ref Range Status   MRSA by PCR NEGATIVE NEGATIVE Final    Comment:        The GeneXpert MRSA Assay (FDA approved for NASAL specimens only), is one component of a comprehensive MRSA colonization surveillance program. It is not intended to diagnose MRSA infection nor to guide or monitor treatment for MRSA infections. 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 Open draining wound at the amputation site. Question osteomyelitis. EXAM: MRI OF THE LEFT FOOT WITHOUT CONTRAST TECHNIQUE: Multiplanar, multisequence MR imaging of  the left foot was performed. No intravenous contrast was administered. COMPARISON:  Plain films left foot 03/11/2018 and 11/23/2017. FINDINGS: Bones/Joint/Cartilage Postoperative change of amputation at the level of the distal second and third metatarsals is identified. Marrow edema in the distal 4.1 cm of the second metatarsal is most intense in the head and neck. Moderate edema is seen in almost the entire third metatarsal remnant extending 6.5 cm in length. Edema is also seen in the head of the fourth metatarsal and throughout the fourth toe. Mild increased T2 signal in the proximal phalanx of the fifth toe could  be artifactual as it is at the edge of the field. Ligaments Intact. Muscles and Tendons Marked atrophy of intrinsic musculature the foot is seen. No intramuscular fluid collection. Soft tissues Subcutaneous edema is present in the dorsum of the foot. There is a focal fluid collection measuring 2.2 cm craniocaudal by 3.1 cm long by 0.6 cm transverse superficial to the distal aspect of the third metatarsal remnant. This collection extends to an open wound at the stump. A second fluid collection along the medial margin of the distal second metatarsal remnant measures 1.2 cm craniocaudal by 0.2 cm transverse by 1 cm long. IMPRESSION: Marrow edema in the distal 4.1 cm of the second metatarsal and throughout almost the entire third metatarsal is consistent with osteomyelitis. Edema is also seen in the fourth metatarsal head and fourth toe consistent with osteomyelitis. Mild increased T2 signal in the proximal phalanx of the fifth toe could be due to reactive change but may also be artifactual. Abscess at the distal aspect of the remnant of the third metatarsal extends to the skin surface. A second abscess is seen along the medial margin of the distal second metatarsal remnant. Diffuse subcutaneous edema about the foot consistent with cellulitis. Electronically Signed   By: Inge Rise M.D.   On:  03/18/2018 10:33    Scheduled Meds: . apixaban  5 mg Oral BID  . atorvastatin  20 mg Oral q1800  . brinzolamide  1 drop Both Eyes BID  . cholecalciferol  2,000 Units Oral Daily  . folic acid  1 mg Oral Daily  . gabapentin  300 mg Oral QHS  . insulin aspart  0-20 Units Subcutaneous TID WC  . insulin aspart  0-5 Units Subcutaneous QHS  . insulin aspart  8 Units Subcutaneous TID WC  . insulin glargine  40 Units Subcutaneous BID  . latanoprost  1 drop Both Eyes QHS  . metoprolol succinate  25 mg Oral q1800  . multivitamin with minerals  1 tablet Oral Daily  . sodium chloride flush  3 mL Intravenous Q12H  . thiamine  100 mg Oral Daily   Or  . thiamine  100 mg Intravenous Daily    Continuous Infusions: . sodium chloride    . sodium chloride Stopped (03/18/18 0218)  . ceFEPime (MAXIPIME) IV 2 g (03/18/18 1221)  . metronidazole 500 mg (03/18/18 1111)  . vancomycin 1,750 mg (03/18/18 1313)     LOS: 1 day     Kayleen Memos, MD [facility] Pager [phone]  If 7PM-7AM, please contact night-coverage [email or web address] Password [email or web address] 03/18/2018, 2:42 PM

## 2018-03-19 DIAGNOSIS — L97529 Non-pressure chronic ulcer of other part of left foot with unspecified severity: Secondary | ICD-10-CM

## 2018-03-19 DIAGNOSIS — G4733 Obstructive sleep apnea (adult) (pediatric): Secondary | ICD-10-CM

## 2018-03-19 DIAGNOSIS — E1142 Type 2 diabetes mellitus with diabetic polyneuropathy: Secondary | ICD-10-CM

## 2018-03-19 LAB — CBC
HEMATOCRIT: 30.4 % — AB (ref 39.0–52.0)
Hemoglobin: 9.8 g/dL — ABNORMAL LOW (ref 13.0–17.0)
MCH: 28.3 pg (ref 26.0–34.0)
MCHC: 32.2 g/dL (ref 30.0–36.0)
MCV: 87.9 fL (ref 78.0–100.0)
Platelets: 258 10*3/uL (ref 150–400)
RBC: 3.46 MIL/uL — AB (ref 4.22–5.81)
RDW: 15.3 % (ref 11.5–15.5)
WBC: 7 10*3/uL (ref 4.0–10.5)

## 2018-03-19 LAB — GLUCOSE, CAPILLARY
GLUCOSE-CAPILLARY: 291 mg/dL — AB (ref 70–99)
Glucose-Capillary: 181 mg/dL — ABNORMAL HIGH (ref 70–99)
Glucose-Capillary: 188 mg/dL — ABNORMAL HIGH (ref 70–99)
Glucose-Capillary: 158 mg/dL — ABNORMAL HIGH (ref 70–99)

## 2018-03-19 LAB — BASIC METABOLIC PANEL
Anion gap: 9 (ref 5–15)
BUN: 23 mg/dL (ref 8–23)
CALCIUM: 8.6 mg/dL — AB (ref 8.9–10.3)
CO2: 23 mmol/L (ref 22–32)
Chloride: 106 mmol/L (ref 98–111)
Creatinine, Ser: 1.63 mg/dL — ABNORMAL HIGH (ref 0.61–1.24)
GFR calc Af Amer: 48 mL/min — ABNORMAL LOW (ref >60)
GFR calc non Af Amer: 42 mL/min — ABNORMAL LOW (ref >60)
Glucose, Bld: 195 mg/dL — ABNORMAL HIGH (ref 70–99)
POTASSIUM: 4.9 mmol/L (ref 3.5–5.1)
SODIUM: 138 mmol/L (ref 135–145)

## 2018-03-19 LAB — MAGNESIUM: MAGNESIUM: 2.1 mg/dL (ref 1.7–2.4)

## 2018-03-19 LAB — APTT: APTT: 40 s — AB (ref 24–36)

## 2018-03-19 LAB — HEPARIN LEVEL (UNFRACTIONATED): Heparin Unfractionated: 2.16 IU/mL — ABNORMAL HIGH (ref 0.30–0.70)

## 2018-03-19 MED ORDER — INSULIN ASPART 100 UNIT/ML ~~LOC~~ SOLN
12.0000 [IU] | Freq: Three times a day (TID) | SUBCUTANEOUS | Status: DC
  Administered 2018-03-19 – 2018-03-24 (×10): 12 [IU] via SUBCUTANEOUS

## 2018-03-19 MED ORDER — HEPARIN (PORCINE) IN NACL 100-0.45 UNIT/ML-% IJ SOLN
2000.0000 [IU]/h | INTRAMUSCULAR | Status: DC
  Administered 2018-03-19: 2000 [IU]/h via INTRAVENOUS
  Filled 2018-03-19 (×2): qty 250

## 2018-03-19 MED ORDER — INSULIN GLARGINE 100 UNIT/ML ~~LOC~~ SOLN
45.0000 [IU] | Freq: Two times a day (BID) | SUBCUTANEOUS | Status: DC
  Administered 2018-03-19 – 2018-03-22 (×6): 45 [IU] via SUBCUTANEOUS
  Filled 2018-03-19 (×8): qty 0.45

## 2018-03-19 NOTE — Progress Notes (Signed)
PROGRESS NOTE  SOMA LIZAK WYO:[id number] DOB: Nov 23, 1950 DOA: 03/17/2018 PCP: Aldine Contes, MD  HPI/Recap of past 24 hours: Oluwanifemi Susman  is a 67 y.o. male  with history of CKD, diabetes, GERD, [name], HTN, morbid obesity, OSA presents for evaluation of acute onset, progressively worsening left foot wound and left lower extremity edema. 7 days ago he followed up with his wound care physician Dr. Dellia Nims and had a cast removed for known foot ulcer overlying thethe plantar aspect ofhead ofthe left third metatarsal. At that time, his physician noted his wound had worsened and tracked all the way down to the bone. He obtained a sample of the bone on 03/10/18 sent for culture to evaluate for osteomyelitis. Cultures came back positive for Proteus mirabilis, Pseudomonas aeruginosa, and MRSA. He has been on Levaquin and doxycycline for 2 days.   03/18/2018: Patient seen and examined at his bedside post MRI.  States his left foot pain is well controlled on current pain medications.  Denies any dyspnea or chest pain.  03/19/18: No acute events overnight.  Patient seen and examined at his bedside.  Reports his pain in his left foot is well controlled on current pain medications.  Denies chest pain, dyspnea or palpitations.  Surgery following plan for left transmetatarsal amputation on Monday 03/21/2018.  Assessment/Plan: Principal Problem:   Cellulitis and abscess of foot Active Problems:   Diabetes type 2, uncontrolled ([place])   Obstructive sleep apnea   Diabetic peripheral neuropathy associated with type 2 diabetes mellitus (HCC)   Chronic kidney disease, stage III (moderate) (HCC)   Morbid obesity ([place])   Hypertension   Foot ulcer, left (HCC)   Paroxysmal atrial fibrillation (HCC)  Protein mirabilis, Pseudomonas aeruginosa, MRSA cellulitis and osteomyelitis of the left foot MRI done at [facility] on 03/18/2018 confirmed osteomyelitis affecting the fourth metatarsal head and fourth toe  complicated by abscesses in the second and third metatarsal region and cellulitis blood cultures x2 peripherally done on 03/18/2018 no growth less than 24 hours Continue IV antibiotics on IV cefepime and IV vancomycin Monitor fever curve Monitor WBC Left transmetatarsal transplant on Monday, 03/21/2018 by orthopedic surgery Repeat labs on Sunday, 03/20/2018  Uncontrolled type 2 diabetes with hyperglycemia Last A1c from July 2019 revealed hemoglobin A1c of 8.0 Continue insulin sliding scale Avoid hyperglycemia to maximize wound healing Diabetes coordinator following.  Highly appreciated. Increase Lantus to 45 units twice daily Increased NovoLog to 12 units 3 times daily  Paroxysmal A. fib Rate controlled on beta-blockers On Eliquis for CVA prophylaxis Hold Eliquis due to planned procedure/left metatarsal amputation on 03/21/2018  Morbid obesity BMI 46 Weight loss outpatient PT to assess  OSA CPAP at night  AKI on CKD 3 improving Baseline creatinine 1.6 with GFR of 49 Creatinine on presentation 1.91 with GFR of 40 Continue to avoid nephrotoxic agents/dehydration/hypotension Creatinine level trending down from 1.91 to 1.86 to 1.63 which is baseline Monitor urine output Repeat BMP in the morning  Alcohol abuse Continue CIWA protocol Continue multivitamins including folic acid and thiamine  Chronic normocytic anemia No sign of overt bleeding Hemoglobin 9.4 with baseline hemoglobin of 11 MCV 88 Repeat CBC in the morning  Chronic diastolic CHF Last 2D echo done on 12/14/2014 revealed preserved LVEF 55%, regional wall motion abnormalities could not be excluded. Strict I's and O's Daily weight  Code Status: Full code  Family Communication: None at bedside  Disposition Plan: Discharge to home in 2-3 days when clinically stable or when orthopedic surgery signs off  Consultants:  Orthopedic surgery  Procedures:  None  Antimicrobials:  IV vancomycin and IV  cefepime  DVT prophylaxis: SCDs   Objective: Vitals:   03/19/18 0500 03/19/18 1500 03/19/18 1558 03/19/18 1609  BP: 130/69 (!) 165/78 (!) 135/58   Pulse: (!) 57 63 (!) 59 62  Resp: 18 18 20    Temp: 99 F (37.2 C) 98.4 F (36.9 C)    TempSrc: Oral Oral    SpO2: 100% 100% 100% 100%  Weight:      Height:        Intake/Output Summary (Last 24 hours) at 03/19/2018 1625 Last data filed at 03/19/2018 1300 Gross per 24 hour  Intake 480 ml  Output 2250 ml  Net -1770 ml   Filed Weights   03/17/18 1500  Weight: (!) 183.8 kg    Exam:  . General: 67 y.o. year-old male obese in no acute distress.  Alert oriented x3. . Cardiovascular: Regular rate and rhythm with no rubs or gallops.  No thyromegaly or JVD noted. Marland Kitchen Respiratory: Clear to auscultation with no wheezes or rales. Good inspiratory effort. . Abdomen: Soft nontender nondistended with normal bowel sounds x4 quadrants. . Musculoskeletal: 2+ pitting edema in lower extremities bilaterally worse on the left from toes up to knee. 2/4 pulses in all 4 extremities. . Skin: Open wound noted in plantar portion of left foot, missing toes. Marland Kitchen Psychiatry: Mood is appropriate for condition and setting   Data Reviewed: CBC: Recent Labs  Lab 03/17/18 1006 03/18/18 0621 03/19/18 0611  WBC 8.5 7.7 7.0  NEUTROABS 5.9  --   --   HGB 11.4* 9.4* 9.8*  HCT 35.0* 29.6* 30.4*  MCV 87.9 88.4 87.9  PLT 286 237 008   Basic Metabolic Panel: Recent Labs  Lab 03/17/18 1006 03/18/18 0621 03/19/18 0611  NA 140 138 138  K 4.6 4.6 4.9  CL 105 104 106  CO2 25 25 23   GLUCOSE 152* 173* 195*  BUN 24* 26* 23  CREATININE 1.91* 1.86* 1.63*  CALCIUM 9.3 8.4* 8.6*  MG  --   --  2.1   GFR: Estimated Creatinine Clearance: 78.8 mL/min (A) (by C-G formula based on SCr of 1.63 mg/dL (H)). Liver Function Tests: No results for input(s): AST, ALT, ALKPHOS, BILITOT, PROT, ALBUMIN in the last 168 hours. No results for input(s): LIPASE, AMYLASE in the last  168 hours. No results for input(s): AMMONIA in the last 168 hours. Coagulation Profile: No results for input(s): INR, PROTIME in the last 168 hours. Cardiac Enzymes: No results for input(s): CKTOTAL, CKMB, CKMBINDEX, TROPONINI in the last 168 hours. BNP (last 3 results) No results for input(s): PROBNP in the last 8760 hours. HbA1C: No results for input(s): HGBA1C in the last 72 hours. CBG: Recent Labs  Lab 03/18/18 1644 03/18/18 1954 03/19/18 0752 03/19/18 1151 03/19/18 1612  GLUCAP 238* 200* 181* 291* 188*   Lipid Profile: No results for input(s): CHOL, HDL, LDLCALC, TRIG, CHOLHDL, LDLDIRECT in the last 72 hours. Thyroid Function Tests: No results for input(s): TSH, T4TOTAL, FREET4, T3FREE, THYROIDAB in the last 72 hours. Anemia Panel: No results for input(s): VITAMINB12, FOLATE, FERRITIN, TIBC, IRON, RETICCTPCT in the last 72 hours. Urine analysis:    Component Value Date/Time   COLORURINE YELLOW 05/17/2015 1025   APPEARANCEUR CLEAR 05/17/2015 1025   LABSPEC >=1.030 (A) 05/17/2015 1025   PHURINE 5.5 05/17/2015 1025   GLUCOSEU NEGATIVE 05/17/2015 1025   HGBUR MODERATE (A) 05/17/2015 1025   BILIRUBINUR NEGATIVE 05/17/2015 1025  KETONESUR NEGATIVE 05/17/2015 1025   PROTEINUR NEGATIVE 12/13/2014 2120   UROBILINOGEN 0.2 05/17/2015 1025   NITRITE NEGATIVE 05/17/2015 1025   LEUKOCYTESUR NEGATIVE 05/17/2015 1025   Sepsis Labs: @LABRCNTIP (procalcitonin:4,lacticidven:4)  ) Recent Results (from the past 240 hour(s))  Aerobic/Anaerobic Culture (surgical/deep wound)     Status: None   Collection Time: 03/10/18 11:45 AM  Result Value Ref Range Status   Specimen Description   Final    BONE LEFT 3RD METATARSAL HEAD Performed at [facility] [address] [name], [place] [address]    Special Requests   Final    NONE Performed at [facility], [place] [address] [place], [place] [address]    Gram Stain   Final    FEW WBC PRESENT,  PREDOMINANTLY MONONUCLEAR FEW GRAM POSITIVE COCCI    Culture   Final    MODERATE PROTEUS MIRABILIS MODERATE PSEUDOMONAS AERUGINOSA FEW METHICILLIN RESISTANT STAPHYLOCOCCUS AUREUS NO ANAEROBES ISOLATED Performed at [facility], [address], [place], [place] [address]    Report Status 03/16/2018 FINAL  Final   Organism ID, Bacteria PROTEUS MIRABILIS  Final   Organism ID, Bacteria PSEUDOMONAS AERUGINOSA  Final   Organism ID, Bacteria METHICILLIN RESISTANT STAPHYLOCOCCUS AUREUS  Final      Susceptibility   Methicillin resistant staphylococcus aureus - MIC*    CIPROFLOXACIN >=8 RESISTANT Resistant     ERYTHROMYCIN <=0.25 SENSITIVE Sensitive     GENTAMICIN <=0.5 SENSITIVE Sensitive     OXACILLIN >=4 RESISTANT Resistant     TETRACYCLINE <=1 SENSITIVE Sensitive     VANCOMYCIN 1 SENSITIVE Sensitive     TRIMETH/SULFA >=320 RESISTANT Resistant     CLINDAMYCIN <=0.25 SENSITIVE Sensitive     RIFAMPIN <=0.5 SENSITIVE Sensitive     Inducible Clindamycin NEGATIVE Sensitive     * FEW METHICILLIN RESISTANT STAPHYLOCOCCUS AUREUS   Pseudomonas aeruginosa - MIC*    CEFTAZIDIME 8 SENSITIVE Sensitive     CIPROFLOXACIN <=0.25 SENSITIVE Sensitive     GENTAMICIN 2 SENSITIVE Sensitive     IMIPENEM 2 SENSITIVE Sensitive     PIP/TAZO 16 SENSITIVE Sensitive     CEFEPIME 4 SENSITIVE Sensitive     * MODERATE PSEUDOMONAS AERUGINOSA   Proteus mirabilis - MIC*    AMPICILLIN <=2 SENSITIVE Sensitive     CEFAZOLIN <=4 SENSITIVE Sensitive     CEFEPIME <=1 SENSITIVE Sensitive     CEFTAZIDIME <=1 SENSITIVE Sensitive     CEFTRIAXONE <=1 SENSITIVE Sensitive     CIPROFLOXACIN <=0.25 SENSITIVE Sensitive     GENTAMICIN <=1 SENSITIVE Sensitive     IMIPENEM 2 SENSITIVE Sensitive     TRIMETH/SULFA <=20 SENSITIVE Sensitive     AMPICILLIN/SULBACTAM <=2 SENSITIVE Sensitive     PIP/TAZO <=4 SENSITIVE Sensitive     * MODERATE PROTEUS MIRABILIS  MRSA PCR Screening     Status: None   Collection Time: 03/18/18  11:13 AM  Result Value Ref Range Status   MRSA by PCR NEGATIVE NEGATIVE Final    Comment:        The GeneXpert MRSA Assay (FDA approved for NASAL specimens only), is one component of a comprehensive MRSA colonization surveillance program. It is not intended to diagnose MRSA infection nor to guide or monitor treatment for MRSA infections. Performed at [facility], [place] [address], [place], [place] [address]   Culture, blood (routine x 2)     Status: None (Preliminary result)   Collection Time: 03/18/18  3:16 PM  Result Value Ref  Range Status   Specimen Description   Final    LEFT ANTECUBITAL Performed at [facility] [address] [place], [place] [address]    Special Requests   Final    BOTTLES DRAWN AEROBIC AND ANAEROBIC Blood Culture adequate volume Performed at [facility] [address] [place], [place] [address]    Culture   Final    NO GROWTH < 24 HOURS Performed at [facility] [address], [place], [place] [address]    Report Status PENDING  Incomplete  Culture, blood (routine x 2)     Status: None (Preliminary result)   Collection Time: 03/18/18  3:18 PM  Result Value Ref Range Status   Specimen Description   Final    BLOOD RIGHT FOREARM Performed at [facility] [address] [place], [place] [address]    Special Requests   Final    BOTTLES DRAWN AEROBIC AND ANAEROBIC Blood Culture adequate volume Performed at [facility] [address], [place], [place] [address]    Culture   Final    NO GROWTH < 24 HOURS Performed at [facility] [address], [place], [place] [address]    Report Status PENDING  Incomplete      Studies: No results found.  Scheduled Meds: . apixaban  5 mg Oral BID  . atorvastatin  20 mg Oral q1800  . brinzolamide  1 drop Both Eyes BID  . cholecalciferol  2,000 Units Oral Daily  . folic acid  1 mg Oral Daily   . gabapentin  300 mg Oral QHS  . insulin aspart  0-20 Units Subcutaneous TID WC  . insulin aspart  0-5 Units Subcutaneous QHS  . insulin aspart  12 Units Subcutaneous TID WC  . insulin glargine  45 Units Subcutaneous BID  . latanoprost  1 drop Both Eyes QHS  . metoprolol succinate  25 mg Oral q1800  . multivitamin with minerals  1 tablet Oral Daily  . sodium chloride flush  3 mL Intravenous Q12H  . thiamine  100 mg Oral Daily   Or  . thiamine  100 mg Intravenous Daily    Continuous Infusions: . sodium chloride    . sodium chloride 50 mL/hr at 03/19/18 1500  . ceFEPime (MAXIPIME) IV 2 g (03/19/18 1259)  . metronidazole 500 mg (03/19/18 1001)  . vancomycin 1,750 mg (03/19/18 1502)     LOS: 2 days     Kayleen Memos, MD [facility] Pager [phone]  If 7PM-7AM, please contact night-coverage [email or web address] Password [facility] 03/19/2018, 4:25 PM

## 2018-03-19 NOTE — Progress Notes (Signed)
ANTICOAGULATION CONSULT NOTE - Initial Consult  Pharmacy Consult for heparin Indication: Afib bridge therapy while Eliquis on hold for OR  Allergies  Allergen Reactions  . Vancomycin     REACTION: ARF    Patient Measurements: Height: '6\' 6"'$  (198.1 cm) Weight: (!) 405 lb 3.8 oz (183.8 kg) IBW/kg (Calculated) : 91.4 Heparin Dosing Weight: 135.1 kg  Vital Signs: Temp: 98.4 F (36.9 C) (09/07 1500) Temp Source: Oral (09/07 1500) BP: 135/58 (09/07 1558) Pulse Rate: 62 (09/07 1609)  Labs: Recent Labs    03/17/18 1006 03/18/18 0621 03/19/18 0611  HGB 11.4* 9.4* 9.8*  HCT 35.0* 29.6* 30.4*  PLT 286 237 258  CREATININE 1.91* 1.86* 1.63*    Estimated Creatinine Clearance: 78.8 mL/min (A) (by C-G formula based on SCr of 1.63 mg/dL (H)).   Medical History: Past Medical History:  Diagnosis Date  . Arthritis    "elbows & knees" (12/13/2014)  . Asthma   . CKD (chronic kidney disease), stage III ([place])   . DIABETIC FOOT ULCER 06/20/2009  . Edema, macular, due to secondary diabetes ([place])   . Erectile dysfunction   . GERD (gastroesophageal reflux disease)   . HEARING LOSS, SENSORINEURAL, BILATERAL 01/03/2007   Seen by ENT Dr. Orpah Greek D. Redmond Baseman 01/03/07  . Hemorrhoids   . History of echocardiogram    a. 04/2008 Echo: EF 50-55%, abnl LV relaxation, mildly dil LA.  Marland Kitchen Hyperlipidemia   . Hypertension   . Morbid obesity ([place])   . Neuropathy, lower extremity   . OSA (obstructive sleep apnea)    uses CPAP nightly  . OSA on CPAP    Nocturnal polysomnogram on 01/21/2010 showed severe obstructive sleep apnea/hypopnea syndrome, AHI 74.1 per hour with non positional events, moderately loud snoring, and oxygen desaturation to a nadir of 78% on room air.  CPAP was successfully titrated to 17 CWP, AHI 1.1 per hour using a large ResMed Mirage Quattro full-face mask with heated humidifier. Bruxism was noted.   . Osteomyelitis of ankle and foot ([place])   . Retinopathy   . Type II diabetes mellitus  (HCC)    w/complication NOS, type II    Medications:  Medications Prior to Admission  Medication Sig Dispense Refill Last Dose  . albuterol (PROAIR HFA) 108 (90 Base) MCG/ACT inhaler Inhale 2 puffs into the lungs every 6 (six) hours as needed for wheezing or shortness of breath. 24.7 g 3 03/16/2018 at Unknown time  . atorvastatin (LIPITOR) 20 MG tablet TAKE 1 TABLET(20 MG) BY MOUTH DAILY 90 tablet 1 03/16/2018 at Unknown time  . AZOPT 1 % ophthalmic suspension Place 1 drop into both eyes 2 (two) times daily.    03/16/2018 at Unknown time  . Cholecalciferol (VITAMIN D3) 2000 UNITS capsule Take 1 capsule (2,000 Units total) by mouth daily. 90 capsule 0 03/17/2018 at Unknown time  . diclofenac sodium (VOLTAREN) 1 % GEL Apply 4 g topically 4 (four) times daily. 100 g 2 03/16/2018 at Unknown time  . docusate sodium (COLACE) 100 MG capsule Take 1 capsule (100 mg total) by mouth daily as needed for mild constipation. 14 capsule 0 Past Month at Unknown time  . doxycycline (MONODOX) 100 MG capsule Take 100 mg by mouth 2 (two) times daily.   0 03/16/2018 at Unknown time  . ELIQUIS 5 MG TABS tablet TAKE 1 TABLET(5 MG) BY MOUTH TWICE DAILY (Patient taking differently: Take 5 mg by mouth 2 (two) times daily. ) 180 tablet 2 03/16/2018 at Unknown time  .  enalapril (VASOTEC) 20 MG tablet TAKE 2 TABLETS(40 MG) BY MOUTH DAILY (Patient taking differently: Take 40 mg by mouth daily. ) 180 tablet 0 03/17/2018 at Unknown time  . furosemide (LASIX) 40 MG tablet TAKE 1 TABLET(40 MG) BY MOUTH TWICE DAILY (Patient taking differently: Take 40 mg by mouth 2 (two) times daily. ) 60 tablet 3 03/17/2018 at Unknown time  . gabapentin (NEURONTIN) 300 MG capsule TAKE 1 CAPSULE(300 MG) BY MOUTH AT BEDTIME 90 capsule 3 03/16/2018 at Unknown time  . HUMULIN R 500 UNIT/ML injection DRAW INSULIN TO THE 68 UNIT LINE ON THE U-100 SYRINGE(TO USE 340 UNITS) PER DAY AS DIRECTED (Patient taking differently: Inject 340 Units into the skin continuous. ) 20 mL 0  03/17/2018 at Unknown time  . HYDROcodone-acetaminophen (NORCO) 7.5-325 MG tablet Take 1 tablet by mouth every 6 (six) hours as needed for moderate pain. 130 tablet 0 03/16/2018 at Unknown time  . Insulin Disposable Pump (V-GO 20) KIT USE 1 PUMP DAILY WITH INSULIN (Patient taking differently: 1 application by Continuous infusion (non-IV) route daily. USE 1 PUMP DAILY WITH INSULIN) 30 kit 0 03/17/2018 at Unknown time  . latanoprost (XALATAN) 0.005 % ophthalmic solution Place 1 drop into both eyes at bedtime.   0 03/16/2018 at Unknown time  . levofloxacin (LEVAQUIN) 500 MG tablet Take 500 mg by mouth daily.   0 03/16/2018 at Unknown time  . metoprolol succinate (TOPROL-XL) 25 MG 24 hr tablet TAKE 1 TABLET(25 MG) BY MOUTH DAILY 90 tablet 3 03/16/2018 at 5pm  . promethazine (PHENERGAN) 25 MG tablet TAKE 1 TABLET BY MOUTH  EVERY 8 HOURS AS NEEDED FOR NAUSEA OR VOMITING 90 tablet 0 Past Week at Unknown time  . SIMBRINZA 1-0.2 % SUSP Place 1 drop into both eyes 2 (two) times daily.    03/16/2018 at Unknown time  . VICTOZA 18 MG/3ML SOPN INJECT 1.8 MG ONCE DAILY AT THE SAME TIME (Patient taking differently: Inject 1.8 mg into the skin at bedtime. Inject 1.8 mg once daily at the same time) 9 mL 3 03/16/2018 at Unknown time  . ACCU-CHEK AVIVA PLUS test strip USE TO CHECK BLOOD FIVE TIMES DAILY 450 each 1 Taking  . ACCU-CHEK FASTCLIX LANCETS MISC Check blood sugar 5 times a day 408 each 3 Taking  . becaplermin (REGRANEX) 0.01 % gel Apply 1 application topically daily. (Patient not taking: Reported on 03/17/2018) 15 g 5 Completed Course at Unknown time  . Blood Glucose Monitoring Suppl (ACCU-CHEK AVIVA PLUS) w/Device KIT CHECK BLOOD SUGAR 5 TIMES A DAY 1 kit 0 Taking  . Continuous Blood Gluc Sensor (FREESTYLE LIBRE 14 DAY SENSOR) MISC 1 each by Does not apply route 6 (six) times daily. 2 each 12 Taking  . HUMULIN R 500 UNIT/ML injection DRAW INSULIN TO THE 68 UNIT LINE ON THE U-100 SYRINGE(TO USE 340 UNITS) PER DAY AS DIRECTED  (Patient not taking: Reported on 03/17/2018) 20 mL 0 Not Taking at Unknown time  . HUMULIN R U-500 KWIKPEN 500 UNIT/ML kwikpen INJECT 35 UNITS AT BREAKFAST, 60 UNITS AT LUNCH AND 70 UNITS AT DINNER (Patient taking differently: Inject 35-65 Units into the skin 2 (two) times daily with a meal. Only if pump runs out) 24 mL 0 long time ago  . silver sulfADIAZINE (SILVADENE) 1 % cream Apply 1 application topically daily. (Patient not taking: Reported on 03/17/2018) 50 g 1 Completed Course at Unknown time    Assessment: 67 yo M on Eliquis  PTA for Afib.  Pharmacy consulted  to dose heparin for bridge therapy while Eliquis on hold for surgery.  On Eliquis 5 mg po bid for afib, last dose 9/7 at 10 am.  Surgery scheduled Monday 9/9 at 1700.   When he was on a heparin bridge in 2016 he was therapeutic on rate of 2000 units/hr.  Hg 11.4>>9.8, PLTC WNL.   Goal of Therapy:  Heparin level 0.3-0.7 units/ml aPTT 66-102 seconds Monitor platelets by anticoagulation protocol: Yes   Plan:  Draw baseline aPTT and heparin level now before starting heparin drip Start heparin drip 12 hours after last Eliquis dose tonight at 2200 at rate of 2000 units/hr and check 8 hr aPTT and HL Will dose based on aPTTs until Eliquis out of system Daily CBC while on heparin MD has ordered heparin drip to stop Sunday night but surgery scheduled for Monday at 1700, will f/u with Ortho - it should be stopped sometime on Monday  Eudelia Bunch, Pharm.D [phone] 03/19/2018 5:17 PM

## 2018-03-19 NOTE — Progress Notes (Signed)
Inpatient Diabetes Program Recommendations  AACE/ADA: New Consensus Statement on Inpatient Glycemic Control (2015)  Target Ranges:  Prepandial:   less than 140 mg/dL      Peak postprandial:   less than 180 mg/dL (1-2 hours)      Critically ill patients:  140 - 180 mg/dL   Results for GODSON, POLLAN (MRN [id number]) as of 03/19/2018 13:23  Ref. Range 03/18/2018 07:27 03/18/2018 11:26 03/18/2018 16:44 03/18/2018 19:54 03/19/2018 07:52 03/19/2018 11:51  Glucose-Capillary Latest Ref Range: 70 - 99 mg/dL 175 (H) 267 (H) 238 (H) 200 (H) 181 (H) 291 (H)   Review of Glycemic Control  Diabetes history: DM 2 Outpatient Diabetes medications: Humulin R U-500 in V-Go 20 pump Current orders for Inpatient glycemic control: Lantus 40 units BID, Novolog 0-20 units tid, Novolog 0-5 units qhs, Novolog 8 units tid meal coverage  Patient reports using Concentrated Humulin R U-500 insulin in his V-Go 20 insulin pump. Patient reports administering 2 clicks for breakfast, 4 clicks for lunch, 6 clicks for supper.  In Total patient uses 100 units of basal insulin 10 units breakfast 20 units lunch 30 units supper  Would recommend increasing  Lantus to 45 units BID.  Increase Novolog Meal Coverage to 12 units tid meal coverage if patient consumes at least 50% of meals and glucose is at least 80 mg/dl.  Thanks,  Tama Headings RN, MSN, BC-ADM Inpatient Diabetes Coordinator Team Pager [phone] (8a-5p)

## 2018-03-19 NOTE — Progress Notes (Signed)
Subjective: Left foot follow-up.  Had MRI of left foot yesterday.  Does not want to lose his entire foot/ankle.  Sitting up at bedside.  Objective: Vital signs in last 24 hours: Temp:  [97.9 F (36.6 C)-99 F (37.2 C)] 99 F (37.2 C) (09/07 0500) Pulse Rate:  [57-61] 57 (09/07 0500) Resp:  [18] 18 (09/07 0500) BP: (130-138)/(69-80) 130/69 (09/07 0500) SpO2:  [100 %] 100 % (09/07 0500)  Intake/Output from previous day: 09/06 0701 - 09/07 0700 In: 575.4 [IV Piggyback:575.4] Out: 1850 [Urine:1850] Intake/Output this shift: No intake/output data recorded.  Recent Labs    03/17/18 1006 03/18/18 0621 03/19/18 0611  HGB 11.4* 9.4* 9.8*   Recent Labs    03/18/18 0621 03/19/18 0611  WBC 7.7 7.0  RBC 3.35* 3.46*  HCT 29.6* 30.4*  PLT 237 258   Recent Labs    03/18/18 0621 03/19/18 0611  NA 138 138  K 4.6 4.9  CL 104 106  CO2 25 23  BUN 26* 23  CREATININE 1.86* 1.63*  GLUCOSE 173* 195*  CALCIUM 8.4* 8.6*  CLINICAL DATA:  History of amputation at the distal second and third metatarsals. Open draining wound at the amputation site. Question osteomyelitis.  EXAM: MRI OF THE LEFT FOOT WITHOUT CONTRAST  TECHNIQUE: Multiplanar, multisequence MR imaging of the left foot was performed. No intravenous contrast was administered.  COMPARISON:  Plain films left foot 03/11/2018 and 11/23/2017.  FINDINGS: Bones/Joint/Cartilage  Postoperative change of amputation at the level of the distal second and third metatarsals is identified. Marrow edema in the distal 4.1 cm of the second metatarsal is most intense in the head and neck. Moderate edema is seen in almost the entire third metatarsal remnant extending 6.5 cm in length. Edema is also seen in the head of the fourth metatarsal and throughout the fourth toe. Mild increased T2 signal in the proximal phalanx of the fifth toe could be artifactual as it is at the edge of the field.  Ligaments  Intact.  Muscles  and Tendons  Marked atrophy of intrinsic musculature the foot is seen. No intramuscular fluid collection.  Soft tissues  Subcutaneous edema is present in the dorsum of the foot. There is a focal fluid collection measuring 2.2 cm craniocaudal by 3.1 cm long by 0.6 cm transverse superficial to the distal aspect of the third metatarsal remnant. This collection extends to an open wound at the stump. A second fluid collection along the medial margin of the distal second metatarsal remnant measures 1.2 cm craniocaudal by 0.2 cm transverse by 1 cm long.  IMPRESSION: Marrow edema in the distal 4.1 cm of the second metatarsal and throughout almost the entire third metatarsal is consistent with osteomyelitis. Edema is also seen in the fourth metatarsal head and fourth toe consistent with osteomyelitis. Mild increased T2 signal in the proximal phalanx of the fifth toe could be due to reactive change but may also be artifactual.  Abscess at the distal aspect of the remnant of the third metatarsal extends to the skin surface. A second abscess is seen along the medial margin of the distal second metatarsal remnant.  Diffuse subcutaneous edema about the foot consistent with cellulitis.   Electronically Signed   By: Inge Rise M.D.   On: 03/18/2018 10:33 Left foot exam: Left foot is wrapped with a dressing.  His toes are swollen and reddened.  He has redness on the dorsum of the foot.  No redness or streaking up into the ankle.   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 (see MRI report for details) Plan: Dr. Berenice Primas has discussed the case and reviewed the case with Dr. Radene Journey our orthopedic foot specialist.  The plan will be for a left transmetatarsal amputation by Dr. Lucia Gaskins on Monday.  I discussed this with the patient and he is ready to move forward with this.  Dr. Berenice Primas will see the patient on Sunday. Continue IV  antibiotics in the meantime.  Erlene Senters 03/19/2018, 10:15 AM

## 2018-03-20 ENCOUNTER — Other Ambulatory Visit: Payer: Self-pay | Admitting: Internal Medicine

## 2018-03-20 ENCOUNTER — Other Ambulatory Visit: Payer: Self-pay | Admitting: Endocrinology

## 2018-03-20 LAB — BASIC METABOLIC PANEL
ANION GAP: 10 (ref 5–15)
BUN: 22 mg/dL (ref 8–23)
CHLORIDE: 107 mmol/L (ref 98–111)
CO2: 23 mmol/L (ref 22–32)
CREATININE: 1.47 mg/dL — AB (ref 0.61–1.24)
Calcium: 8.7 mg/dL — ABNORMAL LOW (ref 8.9–10.3)
GFR calc non Af Amer: 47 mL/min — ABNORMAL LOW (ref >60)
GFR, EST AFRICAN AMERICAN: 55 mL/min — AB (ref >60)
Glucose, Bld: 101 mg/dL — ABNORMAL HIGH (ref 70–99)
POTASSIUM: 4.5 mmol/L (ref 3.5–5.1)
Sodium: 140 mmol/L (ref 135–145)

## 2018-03-20 LAB — GLUCOSE, CAPILLARY
GLUCOSE-CAPILLARY: 93 mg/dL (ref 70–99)
GLUCOSE-CAPILLARY: 111 mg/dL — AB (ref 70–99)
Glucose-Capillary: 179 mg/dL — ABNORMAL HIGH (ref 70–99)
Glucose-Capillary: 149 mg/dL — ABNORMAL HIGH (ref 70–99)

## 2018-03-20 LAB — CBC
HEMATOCRIT: 30.6 % — AB (ref 39.0–52.0)
HEMOGLOBIN: 9.7 g/dL — AB (ref 13.0–17.0)
MCH: 28 pg (ref 26.0–34.0)
MCHC: 31.7 g/dL (ref 30.0–36.0)
MCV: 88.4 fL (ref 78.0–100.0)
Platelets: 273 10*3/uL (ref 150–400)
RBC: 3.46 MIL/uL — AB (ref 4.22–5.81)
RDW: 15.2 % (ref 11.5–15.5)
WBC: 7.3 10*3/uL (ref 4.0–10.5)

## 2018-03-20 LAB — HEPARIN LEVEL (UNFRACTIONATED): HEPARIN UNFRACTIONATED: 1.08 [IU]/mL — AB (ref 0.30–0.70)

## 2018-03-20 LAB — APTT
APTT: 68 s — AB (ref 24–36)
aPTT: 58 seconds — ABNORMAL HIGH (ref 24–36)

## 2018-03-20 MED ORDER — VANCOMYCIN HCL 10 G IV SOLR
2000.0000 mg | INTRAVENOUS | Status: DC
  Administered 2018-03-20 – 2018-03-22 (×3): 2000 mg via INTRAVENOUS
  Filled 2018-03-20 (×4): qty 2000

## 2018-03-20 MED ORDER — BISACODYL 10 MG RE SUPP
10.0000 mg | Freq: Once | RECTAL | Status: AC
  Administered 2018-03-20: 10 mg via RECTAL
  Filled 2018-03-20: qty 1

## 2018-03-20 MED ORDER — POLYETHYLENE GLYCOL 3350 17 G PO PACK
17.0000 g | PACK | Freq: Two times a day (BID) | ORAL | Status: DC
  Administered 2018-03-20 – 2018-03-24 (×7): 17 g via ORAL
  Filled 2018-03-20 (×8): qty 1

## 2018-03-20 MED ORDER — HEPARIN (PORCINE) IN NACL 100-0.45 UNIT/ML-% IJ SOLN
2200.0000 [IU]/h | INTRAMUSCULAR | Status: DC
  Administered 2018-03-20 (×2): 2200 [IU]/h via INTRAVENOUS
  Filled 2018-03-20 (×2): qty 250

## 2018-03-20 MED ORDER — SENNOSIDES-DOCUSATE SODIUM 8.6-50 MG PO TABS
2.0000 | ORAL_TABLET | Freq: Two times a day (BID) | ORAL | Status: DC
  Administered 2018-03-20 – 2018-03-24 (×8): 2 via ORAL
  Filled 2018-03-20 (×9): qty 2

## 2018-03-20 MED ORDER — HEPARIN (PORCINE) IN NACL 100-0.45 UNIT/ML-% IJ SOLN
2300.0000 [IU]/h | INTRAMUSCULAR | Status: DC
  Administered 2018-03-21: 2300 [IU]/h via INTRAVENOUS
  Filled 2018-03-20 (×2): qty 250

## 2018-03-20 NOTE — Progress Notes (Signed)
Heparin gttp initiated, see MAR. Verified w/ Dyeze RN.

## 2018-03-20 NOTE — Progress Notes (Signed)
PROGRESS NOTE  [name] IRS:[id number] DOB: Jul 27, 1950 DOA: 03/17/2018 PCP: Aldine Contes, MD  HPI/Recap of past 24 hours: [name]  is a 67 y.o. male  with history of CKD, diabetes, GERD, [name], HTN, morbid obesity, OSA presents for evaluation of acute onset, progressively worsening left foot wound and left lower extremity edema. 7 days ago he followed up with his wound care physician Dr. Dellia Nims and had a cast removed for known foot ulcer overlying thethe plantar aspect ofhead ofthe left third metatarsal. At that time, his physician noted his wound had worsened and tracked all the way down to the bone. He obtained a sample of the bone on 03/10/18 sent for culture to evaluate for osteomyelitis. Cultures came back positive for Proteus mirabilis, Pseudomonas aeruginosa, and MRSA. He has been on Levaquin and doxycycline for 2 days.   03/18/2018: Patient seen and examined at his bedside post MRI.  States his left foot pain is well controlled on current pain medications.  Denies any dyspnea or chest pain.  03/19/18: No acute events overnight.  Patient seen and examined at his bedside.  Reports his pain in his left foot is well controlled on current pain medications.  Denies chest pain, dyspnea or palpitations.  Surgery following plan for left transmetatarsal amputation on Monday 03/21/2018.  03/20/2018: No acute events overnight.  Holding off Eliquis.  On heparin drip anticipated surgery tomorrow.  Pain is well controlled.  No new complaints.  Assessment/Plan: Principal Problem:   Cellulitis and abscess of foot Active Problems:   Diabetes type 2, uncontrolled ([place])   Obstructive sleep apnea   Diabetic peripheral neuropathy associated with type 2 diabetes mellitus (HCC)   Chronic kidney disease, stage III (moderate) (HCC)   Morbid obesity ([place])   Hypertension   Foot ulcer, left (HCC)   Paroxysmal atrial fibrillation (HCC)  Protein mirabilis, Pseudomonas aeruginosa, MRSA  cellulitis and osteomyelitis of the left foot MRI done at [facility] on 03/18/2018 confirmed osteomyelitis affecting the fourth metatarsal head and fourth toe complicated by abscesses in the second and third metatarsal region and cellulitis blood cultures x2 peripherally done on 03/18/2018 no growth less than 24 hours Continue IV antibiotics on IV cefepime and IV vancomycin Monitor fever curve Monitor WBC Left transmetatarsal transplant on Monday, 03/21/2018 by orthopedic surgery Repeat labs on Sunday, 03/20/2018  Uncontrolled type 2 diabetes with hyperglycemia Last A1c from July 2019 revealed hemoglobin A1c of 8.0 Continue insulin sliding scale Avoid hyperglycemia to maximize wound healing Diabetes coordinator following.  Highly appreciated. Increase Lantus to 45 units twice daily Increased NovoLog to 12 units 3 times daily  Paroxysmal A. fib Rate controlled on beta-blockers Hold Eliquis due to surgery planned tomorrow Continue heparin drip which is being managed by pharmacy. Hold heparin drip at 9 AM tomorrow 03/21/2018 in anticipation of surgery which is scheduled at 5 PM.  Morbid obesity BMI 46 Weight loss outpatient PT to assess  OSA CPAP at night  AKI on CKD 3 improving Baseline creatinine 1.6 with GFR of 49 Creatinine on presentation 1.91 with GFR of 40 Continue to avoid nephrotoxic agents/dehydration/hypotension Creatinine level trending down from 1.91 to 1.86 to 1.63 which is baseline Monitor urine output Repeat BMP in the morning  Alcohol abuse Continue CIWA protocol Continue multivitamins including folic acid and thiamine  Chronic normocytic anemia No sign of overt bleeding Hemoglobin 9.4 with baseline hemoglobin of 11 MCV 88 Repeat CBC in the morning  Chronic diastolic CHF Last 2D echo done on 12/14/2014 revealed preserved  LVEF 55%, regional wall motion abnormalities could not be excluded. Strict I's and O's Daily weight  Code Status: Full code  Family Communication:  None at bedside  Disposition Plan: Discharge to home in 2-3 days when clinically stable or when orthopedic surgery signs off   Consultants:  Orthopedic surgery  Procedures:  None  Antimicrobials:  IV vancomycin and IV cefepime  DVT prophylaxis: Heparin drip   Objective: Vitals:   03/19/18 1558 03/19/18 1609 03/19/18 2024 03/20/18 0500  BP: (!) 135/58  (!) 147/76 138/79  Pulse: (!) 59 62 (!) 58 (!) 57  Resp: 20  20 18   Temp:   97.9 F (36.6 C) (!) 97.5 F (36.4 C)  TempSrc:   Oral Oral  SpO2: 100% 100% 99% 100%  Weight:      Height:        Intake/Output Summary (Last 24 hours) at 03/20/2018 1452 Last data filed at 03/20/2018 5643 Gross per 24 hour  Intake 1696.49 ml  Output 2200 ml  Net -503.51 ml   Filed Weights   03/17/18 1500  Weight: (!) 183.8 kg    Exam:  . General: 67 y.o. year-old male obese male in no acute distress.  Alert oriented x3. . Cardiovascular: Regular rate and rhythm with no rubs or gallops.  No JVD or thyromegaly noted. Marland Kitchen Respiratory: Clear to auscultation with no wheezes or rales. Good inspiratory effort. . Abdomen: Soft nontender nondistended with normal bowel sounds x4 quadrants. . Musculoskeletal: 2+ pitting edema in lower extremities bilaterally worse on the left from toes up to knee. 2/4 pulses in all 4 extremities. . Skin: Open wound noted in plantar portion of left foot, missing toes. Marland Kitchen Psychiatry: Mood is appropriate for condition and setting   Data Reviewed: CBC: Recent Labs  Lab 03/17/18 1006 03/18/18 0621 03/19/18 0611 03/20/18 0627  WBC 8.5 7.7 7.0 7.3  NEUTROABS 5.9  --   --   --   HGB 11.4* 9.4* 9.8* 9.7*  HCT 35.0* 29.6* 30.4* 30.6*  MCV 87.9 88.4 87.9 88.4  PLT 286 237 258 329   Basic Metabolic Panel: Recent Labs  Lab 03/17/18 1006 03/18/18 0621 03/19/18 0611 03/20/18 0627  NA 140 138 138 140  K 4.6 4.6 4.9 4.5  CL 105 104 106 107  CO2 25 25 23 23   GLUCOSE 152* 173* 195* 101*  BUN 24* 26* 23 22    CREATININE 1.91* 1.86* 1.63* 1.47*  CALCIUM 9.3 8.4* 8.6* 8.7*  MG  --   --  2.1  --    GFR: Estimated Creatinine Clearance: 87.3 mL/min (A) (by C-G formula based on SCr of 1.47 mg/dL (H)). Liver Function Tests: No results for input(s): AST, ALT, ALKPHOS, BILITOT, PROT, ALBUMIN in the last 168 hours. No results for input(s): LIPASE, AMYLASE in the last 168 hours. No results for input(s): AMMONIA in the last 168 hours. Coagulation Profile: No results for input(s): INR, PROTIME in the last 168 hours. Cardiac Enzymes: No results for input(s): CKTOTAL, CKMB, CKMBINDEX, TROPONINI in the last 168 hours. BNP (last 3 results) No results for input(s): PROBNP in the last 8760 hours. HbA1C: No results for input(s): HGBA1C in the last 72 hours. CBG: Recent Labs  Lab 03/19/18 1151 03/19/18 1612 03/19/18 2021 03/20/18 0741 03/20/18 1220  GLUCAP 291* 188* 158* 93 179*   Lipid Profile: No results for input(s): CHOL, HDL, LDLCALC, TRIG, CHOLHDL, LDLDIRECT in the last 72 hours. Thyroid Function Tests: No results for input(s): TSH, T4TOTAL, FREET4, T3FREE, THYROIDAB in  the last 72 hours. Anemia Panel: No results for input(s): VITAMINB12, FOLATE, FERRITIN, TIBC, IRON, RETICCTPCT in the last 72 hours. Urine analysis:    Component Value Date/Time   COLORURINE YELLOW 05/17/2015 1025   APPEARANCEUR CLEAR 05/17/2015 1025   LABSPEC >=1.030 (A) 05/17/2015 1025   PHURINE 5.5 05/17/2015 1025   GLUCOSEU NEGATIVE 05/17/2015 1025   HGBUR MODERATE (A) 05/17/2015 1025   BILIRUBINUR NEGATIVE 05/17/2015 1025   KETONESUR NEGATIVE 05/17/2015 1025   PROTEINUR NEGATIVE 12/13/2014 2120   UROBILINOGEN 0.2 05/17/2015 1025   NITRITE NEGATIVE 05/17/2015 1025   LEUKOCYTESUR NEGATIVE 05/17/2015 1025   Sepsis Labs: @LABRCNTIP (procalcitonin:4,lacticidven:4)  ) Recent Results (from the past 240 hour(s))  MRSA PCR Screening     Status: None   Collection Time: 03/18/18 11:13 AM  Result Value Ref Range Status    MRSA by PCR NEGATIVE NEGATIVE Final    Comment:        The GeneXpert MRSA Assay (FDA approved for NASAL specimens only), is one component of a comprehensive MRSA colonization surveillance program. It is not intended to diagnose MRSA infection nor to guide or monitor treatment for MRSA infections. Performed at [facility], [place] [address] [place], [place] [address]   Culture, blood (routine x 2)     Status: None (Preliminary result)   Collection Time: 03/18/18  3:16 PM  Result Value Ref Range Status   Specimen Description   Final    LEFT ANTECUBITAL Performed at [facility] [address] [place], [place] [address]    Special Requests   Final    BOTTLES DRAWN AEROBIC AND ANAEROBIC Blood Culture adequate volume Performed at [facility] [address], [place], [place] [address]    Culture   Final    NO GROWTH 2 DAYS Performed at [facility] [address] [place], [place] [address]    Report Status PENDING  Incomplete  Culture, blood (routine x 2)     Status: None (Preliminary result)   Collection Time: 03/18/18  3:18 PM  Result Value Ref Range Status   Specimen Description   Final    BLOOD RIGHT FOREARM Performed at [facility] [address] [place], [place] [address]    Special Requests   Final    BOTTLES DRAWN AEROBIC AND ANAEROBIC Blood Culture adequate volume Performed at [facility] [address], [place], [place] [address]    Culture   Final    NO GROWTH 2 DAYS Performed at [facility] [address], [place], [place] [address]    Report Status PENDING  Incomplete      Studies: No results found.  Scheduled Meds: . atorvastatin  20 mg Oral q1800  . brinzolamide  1 drop Both Eyes BID  . cholecalciferol  2,000 Units Oral Daily  . folic acid  1 mg Oral Daily  . gabapentin  300 mg Oral QHS  . insulin aspart  0-20 Units Subcutaneous  TID WC  . insulin aspart  0-5 Units Subcutaneous QHS  . insulin aspart  12 Units Subcutaneous TID WC  . insulin glargine  45 Units Subcutaneous BID  . latanoprost  1 drop Both Eyes QHS  . metoprolol succinate  25 mg Oral q1800  . multivitamin with minerals  1 tablet Oral Daily  . polyethylene glycol  17 g Oral BID  . senna-docusate  2 tablet Oral BID  . sodium chloride flush  3 mL Intravenous  Q12H  . thiamine  100 mg Oral Daily   Or  . thiamine  100 mg Intravenous Daily    Continuous Infusions: . sodium chloride    . sodium chloride Stopped (03/20/18 0211)  . ceFEPime (MAXIPIME) IV 2 g (03/20/18 1213)  . heparin 2,200 Units/hr (03/20/18 1213)  . metronidazole 500 mg (03/20/18 1030)  . vancomycin 2,000 mg (03/20/18 1339)     LOS: 3 days     Kayleen Memos, MD [facility] Pager [phone]  If 7PM-7AM, please contact night-coverage [email or web address] Password [email or web address] 03/20/2018, 2:52 PM

## 2018-03-20 NOTE — Plan of Care (Signed)
  Problem: Nutrition: Goal: Adequate nutrition will be maintained 03/20/2018 1117 by Dorene Sorrow, RN Outcome: Progressing 03/20/2018 1116 by Dorene Sorrow, RN Outcome: Progressing   Problem: Activity: Goal: Risk for activity intolerance will decrease 03/20/2018 1117 by Dorene Sorrow, RN Outcome: Progressing 03/20/2018 1116 by Dorene Sorrow, RN Outcome: Progressing   Problem: Pain Managment: Goal: General experience of comfort will improve 03/20/2018 1117 by Dorene Sorrow, RN Outcome: Progressing 03/20/2018 1116 by Dorene Sorrow, RN Outcome: Progressing

## 2018-03-20 NOTE — Progress Notes (Signed)
Subjective: The patient appears to be comfortable today.  His swelling is slightly improved in the leg.  He is not complaining of significant pain.   Objective: Vital signs in last 24 hours: Temp:  [97.5 F (36.4 C)-98.4 F (36.9 C)] 97.5 F (36.4 C) (09/08 0500) Pulse Rate:  [57-63] 57 (09/08 0500) Resp:  [18-20] 18 (09/08 0500) BP: (135-165)/(58-79) 138/79 (09/08 0500) SpO2:  [99 %-100 %] 100 % (09/08 0500)  Intake/Output from previous day: 09/07 0701 - 09/08 0700 In: 1667.3 [P.O.:720; I.V.:456.6; IV Piggyback:490.7] Out: 2700 [Urine:2700] Intake/Output this shift: Total I/O In: 509.2 [IV Piggyback:509.2] Out: 500 [Urine:500]  Recent Labs    03/17/18 1006 03/18/18 0621 03/19/18 0611 03/20/18 0627  HGB 11.4* 9.4* 9.8* 9.7*   Recent Labs    03/19/18 0611 03/20/18 0627  WBC 7.0 7.3  RBC 3.46* 3.46*  HCT 30.4* 30.6*  PLT 258 273   Recent Labs    03/19/18 0611 03/20/18 0627  NA 138 140  K 4.9 4.5  CL 106 107  CO2 23 23  BUN 23 22  CREATININE 1.63* 1.47*  GLUCOSE 195* 101*  CALCIUM 8.6* 8.7*   No results for input(s): LABPT, INR in the last 72 hours.  ABD soft No cellulitis present Compartment soft Wound has some continued drainage but appears to have dried up some.  He continues to have the small abscess on the bottom of the foot.      Assessment/Plan: 67 year old male diabetic with increased swelling and drainage in the left foot.  He has osteomyelitis of the metatarsals.  I had a long discussion with the patient about treatment options and I have consulted Dr. Radene Journey in my office about treatment options.  Transmetatarsal amputation seems to be the best option at this point.  Dr. Lucia Gaskins will be taking on his care tomorrow and will proceed with transmetatarsal amputation.  The patient will come off of IV heparin therapy at 9 AM tomorrow in anticipation of surgery which is currently scheduled at 5 PM but could be moved up and so we want to make sure  that the heparin is stopped in time for this not to be an issue.  The patient is well aware of the risks of surgery including infection, bleeding, need for further surgery, and a slight chance of death and around the time of surgery.  He does wish to proceed even in light of these risks.   Alta Corning 03/20/2018, 9:15 AM

## 2018-03-20 NOTE — Progress Notes (Signed)
ANTICOAGULATION CONSULT NOTE - [place] for heparin Indication: A-fib bridge therapy while Apixaban on hold for OR  Allergies  Allergen Reactions  . Vancomycin     REACTION: ARF    Patient Measurements: Height: 6\' 6"  (198.1 cm) Weight: (!) 405 lb 3.8 oz (183.8 kg) IBW/kg (Calculated) : 91.4 Heparin Dosing Weight: 135.1 kg  Vital Signs: Temp: 97.5 F (36.4 C) (09/08 0500) Temp Source: Oral (09/08 0500) BP: 138/79 (09/08 0500) Pulse Rate: 57 (09/08 0500)  Labs: Recent Labs    03/18/18 0621 03/19/18 0611 03/19/18 1807 03/20/18 0627  HGB 9.4* 9.8*  --  9.7*  HCT 29.6* 30.4*  --  30.6*  PLT 237 258  --  273  APTT  --   --  40* 58*  HEPARINUNFRC  --   --  2.16* 1.08*  CREATININE 1.86* 1.63*  --  1.47*    Estimated Creatinine Clearance: 87.3 mL/min (A) (by C-G formula based on SCr of 1.47 mg/dL (H)).   Medical History: Past Medical History:  Diagnosis Date  . Arthritis    "elbows & knees" (12/13/2014)  . Asthma   . CKD (chronic kidney disease), stage III ([place])   . DIABETIC FOOT ULCER 06/20/2009  . Edema, macular, due to secondary diabetes ([place])   . Erectile dysfunction   . GERD (gastroesophageal reflux disease)   . HEARING LOSS, SENSORINEURAL, BILATERAL 01/03/2007   Seen by ENT Dr. Orpah Greek D. Redmond Baseman 01/03/07  . Hemorrhoids   . History of echocardiogram    a. 04/2008 Echo: EF 50-55%, abnl LV relaxation, mildly dil LA.  Marland Kitchen Hyperlipidemia   . Hypertension   . Morbid obesity ([place])   . Neuropathy, lower extremity   . OSA (obstructive sleep apnea)    uses CPAP nightly  . OSA on CPAP    Nocturnal polysomnogram on 01/21/2010 showed severe obstructive sleep apnea/hypopnea syndrome, AHI 74.1 per hour with non positional events, moderately loud snoring, and oxygen desaturation to a nadir of 78% on room air.  CPAP was successfully titrated to 17 CWP, AHI 1.1 per hour using a large ResMed Mirage Quattro full-face mask with heated humidifier. Bruxism was  noted.   . Osteomyelitis of ankle and foot ([place])   . Retinopathy   . Type II diabetes mellitus (HCC)    w/complication NOS, type II   Assessment: 67 y/oM on Apixaban PTA for a-fib.  Pharmacy consulted to dose heparin for bridge therapy while Apixaban on hold for surgery. Last dose of Apixaban 5mg  PO BID was 9/7 at 1002. Left transmetatarsal amputation scheduled for Monday, 9/9, at 1700. Baseline heparin level falsely elevated, as expected, due to recent Apixaban use.    Today, 03/20/18:  Heparin level elevated, 1.08 units/mL, likely still false elevation due to recent Apixaban  APTT = 58 seconds, slightly subtherapeutic on heparin infusion at 2000 units/hr  CBC: Hgb low/stable at 9.7, Pltc WNL  No bleeding or complications of therapy noted per nursing   Goal of Therapy:  Heparin level 0.3-0.7 units/ml aPTT 66-102 seconds Monitor platelets by anticoagulation protocol: Yes   Plan:   Increase heparin infusion rate to 2200 units/hr  aPTT 6 hours after rate change  Monitor aPTT and heparin levels since recent Apixaban will falsely elevate heparin levels; once the two correlate, will monitor with heparin levels only  Will dose based on aPTT until Apixaban out of system  Daily CBC, HL while on heparin  Monitor for s/sx of bleeding  Clarify stop time of heparin  infusion with orthopedics.    Lindell Spar, PharmD, BCPS Pager: [phone] 03/20/2018 8:11 AM

## 2018-03-20 NOTE — Progress Notes (Signed)
ANTICOAGULATION CONSULT NOTE - [place] for heparin Indication: A-fib bridge therapy while Apixaban on hold for OR  Allergies  Allergen Reactions  . Vancomycin     REACTION: ARF    Patient Measurements: Height: 6\' 6"  (198.1 cm) Weight: (!) 405 lb 3.8 oz (183.8 kg) IBW/kg (Calculated) : 91.4 Heparin Dosing Weight: 135.1 kg  Vital Signs: Temp: 98.6 F (37 C) (09/08 1509) Temp Source: Oral (09/08 1509) BP: 152/71 (09/08 1509) Pulse Rate: 69 (09/08 1509)  Labs: Recent Labs    03/18/18 0621 03/19/18 8242 03/19/18 1807 03/20/18 0627 03/20/18 1428  HGB 9.4* 9.8*  --  9.7*  --   HCT 29.6* 30.4*  --  30.6*  --   PLT 237 258  --  273  --   APTT  --   --  40* 58* 68*  HEPARINUNFRC  --   --  2.16* 1.08*  --   CREATININE 1.86* 1.63*  --  1.47*  --     Estimated Creatinine Clearance: 87.3 mL/min (A) (by C-G formula based on SCr of 1.47 mg/dL (H)).   Medical History: Past Medical History:  Diagnosis Date  . Arthritis    "elbows & knees" (12/13/2014)  . Asthma   . CKD (chronic kidney disease), stage III ([place])   . DIABETIC FOOT ULCER 06/20/2009  . Edema, macular, due to secondary diabetes ([place])   . Erectile dysfunction   . GERD (gastroesophageal reflux disease)   . HEARING LOSS, SENSORINEURAL, BILATERAL 01/03/2007   Seen by ENT Dr. Orpah Greek D. Redmond Baseman 01/03/07  . Hemorrhoids   . History of echocardiogram    a. 04/2008 Echo: EF 50-55%, abnl LV relaxation, mildly dil LA.  Marland Kitchen Hyperlipidemia   . Hypertension   . Morbid obesity ([place])   . Neuropathy, lower extremity   . OSA (obstructive sleep apnea)    uses CPAP nightly  . OSA on CPAP    Nocturnal polysomnogram on 01/21/2010 showed severe obstructive sleep apnea/hypopnea syndrome, AHI 74.1 per hour with non positional events, moderately loud snoring, and oxygen desaturation to a nadir of 78% on room air.  CPAP was successfully titrated to 17 CWP, AHI 1.1 per hour using a large ResMed Mirage Quattro  full-face mask with heated humidifier. Bruxism was noted.   . Osteomyelitis of ankle and foot ([place])   . Retinopathy   . Type II diabetes mellitus (HCC)    w/complication NOS, type II   Assessment: 67 y/oM on Apixaban PTA for a-fib.  Pharmacy consulted to dose heparin for bridge therapy while Apixaban on hold for surgery. Last dose of Apixaban 5mg  PO BID was 9/7 at 1002. Left transmetatarsal amputation scheduled for Monday, 9/9, at 1700. Baseline heparin level falsely elevated, as expected, due to recent Apixaban use.    Today, 03/20/18:  AM Heparin level elevated, 1.08 units/mL, likely still false elevation due to recent Apixaban  AM aPTT = 58 seconds, slightly subtherapeutic on heparin infusion at 2000 units/hr  1428 aPTT = 68 - in therapeutic range after rate increaed to 2200 units/hr  CBC: Hgb low/stable at 9.7, Pltc WNL  No bleeding or complications of therapy noted per nursing   Goal of Therapy:  Heparin level 0.3-0.7 units/ml aPTT 66-102 seconds Monitor platelets by anticoagulation protocol: Yes   Plan:   Increase heparin infusion rate to 2300 units/hr to keep in therapeutic range  Monitor aPTT and heparin levels since recent Apixaban will falsely elevate heparin levels; once the two correlate, will monitor with  heparin levels only  Will dose based on aPTT until Apixaban out of system  Daily CBC, HL while on heparin  Monitor for s/sx of bleeding  Per ortho heparin to stop 9/9 at 09 am for [place], Pharm.D [phone] 03/20/2018 3:21 PM

## 2018-03-20 NOTE — Progress Notes (Signed)
Pharmacy Antibiotic Note  [name] is a 67 y.o. male admitted on 03/17/2018  with chronic DM foot wound. Seen at wound clinic for biopsy 7d PTA, sample of the bone sent for culture to evaluate for osteomyelitis.  Cultures from 8/29 came back positive for Proteus mirabilis, Pseudomonas aeruginosa (both sensitive to Cefepime), and MRSA.  He has been on Levaquin and doxycycline for 2 days. He notes subjective fevers and chills, nausea,  Pharmacy has been consulted for Vancomycin & Cefepime dosing. 03/20/2018  WBC remains WNL, SCr down to 1.47 from 1.91 on admit. OR scheduled for 9/9 at 1700 for transmetatarsal amputation.  Plan: Continue Cefepime 2gm IV q8hr Increase vancomycin to 2 gm IV q24 for est AUC 428 (Scr 1.47, IBW/ABW wt 183.8 kg) Continue Flagyl 500mg  IV q8 per MD - Daily Serum Creatinine F/u post op abx plans  Height: 6\' 6"  (198.1 cm) Weight: (!) 405 lb 3.8 oz (183.8 kg) IBW/kg (Calculated) : 91.4  Temp (24hrs), Avg:97.9 F (36.6 C), Min:97.5 F (36.4 C), Max:98.4 F (36.9 C)  Recent Labs  Lab 03/17/18 1006 03/17/18 1014 03/18/18 0621 03/19/18 0611 03/20/18 0627  WBC 8.5  --  7.7 7.0 7.3  CREATININE 1.91*  --  1.86* 1.63* 1.47*  LATICACIDVEN  --  1.10  --   --   --     Estimated Creatinine Clearance: 87.3 mL/min (A) (by C-G formula based on SCr of 1.47 mg/dL (H)).    Allergies  Allergen Reactions  . Vancomycin     REACTION: ARF   Antimicrobials this admission: 9/5 Cefepime >>  9/5/ Vancomycin >>  9/5 Flagyl >>  Dose adjustments this admission: 9/8 vanc 1750 q24>>2 gm q24 for improved renal fxn  Microbiology results: 8/29 bone sample: Pseudomonas, Proteus, MRSA- Vanc MIC 1 9/5 HIV neg 9/6 MRSA PCR neg 9/6 BCx2>>ngtd  Thank you for allowing pharmacy to be a part of this patient's care. Eudelia Bunch, Pharm.D [phone] 03/20/2018 9:36 AM

## 2018-03-21 ENCOUNTER — Inpatient Hospital Stay (HOSPITAL_COMMUNITY): Payer: Medicare Other | Admitting: Certified Registered Nurse Anesthetist

## 2018-03-21 ENCOUNTER — Encounter (HOSPITAL_COMMUNITY)
Admission: EM | Disposition: A | Discharge status: Skilled Nursing Facility | Payer: Self-pay | Source: Ambulatory Visit | Attending: Internal Medicine

## 2018-03-21 ENCOUNTER — Telehealth: Payer: Self-pay | Admitting: *Deleted

## 2018-03-21 DIAGNOSIS — H1013 Acute atopic conjunctivitis, bilateral: Secondary | ICD-10-CM | POA: Diagnosis not present

## 2018-03-21 HISTORY — PX: ACHILLES TENDON LENGTHENING: SHX6455

## 2018-03-21 HISTORY — PX: AMPUTATION: SHX166

## 2018-03-21 LAB — CBC
HEMATOCRIT: 30 % — AB (ref 39.0–52.0)
Hemoglobin: 9.5 g/dL — ABNORMAL LOW (ref 13.0–17.0)
MCH: 27.7 pg (ref 26.0–34.0)
MCHC: 31.7 g/dL (ref 30.0–36.0)
MCV: 87.5 fL (ref 78.0–100.0)
PLATELETS: 295 10*3/uL (ref 150–400)
RBC: 3.43 MIL/uL — AB (ref 4.22–5.81)
RDW: 15.5 % (ref 11.5–15.5)
WBC: 8 10*3/uL (ref 4.0–10.5)

## 2018-03-21 LAB — GLUCOSE, CAPILLARY
GLUCOSE-CAPILLARY: 86 mg/dL (ref 70–99)
GLUCOSE-CAPILLARY: 59 mg/dL — AB (ref 70–99)
GLUCOSE-CAPILLARY: 95 mg/dL (ref 70–99)
GLUCOSE-CAPILLARY: 86 mg/dL (ref 70–99)
GLUCOSE-CAPILLARY: 83 mg/dL (ref 70–99)
Glucose-Capillary: 63 mg/dL — ABNORMAL LOW (ref 70–99)
Glucose-Capillary: 68 mg/dL — ABNORMAL LOW (ref 70–99)
Glucose-Capillary: 101 mg/dL — ABNORMAL HIGH (ref 70–99)
Glucose-Capillary: 69 mg/dL — ABNORMAL LOW (ref 70–99)

## 2018-03-21 LAB — BASIC METABOLIC PANEL
ANION GAP: 6 (ref 5–15)
BUN: 20 mg/dL (ref 8–23)
CO2: 25 mmol/L (ref 22–32)
Calcium: 8.5 mg/dL — ABNORMAL LOW (ref 8.9–10.3)
Chloride: 108 mmol/L (ref 98–111)
Creatinine, Ser: 1.41 mg/dL — ABNORMAL HIGH (ref 0.61–1.24)
GFR calc Af Amer: 58 mL/min — ABNORMAL LOW (ref >60)
GFR calc non Af Amer: 50 mL/min — ABNORMAL LOW (ref >60)
GLUCOSE: 57 mg/dL — AB (ref 70–99)
POTASSIUM: 4 mmol/L (ref 3.5–5.1)
Sodium: 139 mmol/L (ref 135–145)

## 2018-03-21 LAB — CREATININE, SERUM
Creatinine, Ser: 1.41 mg/dL — ABNORMAL HIGH (ref 0.61–1.24)
GFR, EST AFRICAN AMERICAN: 58 mL/min — AB (ref >60)
GFR, EST NON AFRICAN AMERICAN: 50 mL/min — AB (ref >60)

## 2018-03-21 LAB — HEPARIN LEVEL (UNFRACTIONATED): Heparin Unfractionated: 0.87 IU/mL — ABNORMAL HIGH (ref 0.30–0.70)

## 2018-03-21 LAB — APTT: aPTT: 114 seconds — ABNORMAL HIGH (ref 24–36)

## 2018-03-21 SURGERY — AMPUTATION, FOOT, RAY
Anesthesia: Monitor Anesthesia Care | Site: Foot | Laterality: Left

## 2018-03-21 MED ORDER — CHLORHEXIDINE GLUCONATE 4 % EX LIQD: 60.0000 mL | Freq: Once | CUTANEOUS | Status: DC

## 2018-03-21 MED ORDER — DEXTROSE 50 % IV SOLN
INTRAVENOUS | Status: AC
  Filled 2018-03-21: qty 50

## 2018-03-21 MED ORDER — LIDOCAINE-EPINEPHRINE 2 %-1:100000 IJ SOLN
INTRAMUSCULAR | Status: DC | PRN
  Administered 2018-03-21: 15 mL via PERINEURAL

## 2018-03-21 MED ORDER — LACTATED RINGERS IV SOLN
INTRAVENOUS | Status: DC
  Administered 2018-03-21 (×2): via INTRAVENOUS

## 2018-03-21 MED ORDER — ROPIVACAINE HCL 5 MG/ML IJ SOLN
INTRAMUSCULAR | Status: DC | PRN
  Administered 2018-03-21: 25 mL via PERINEURAL

## 2018-03-21 MED ORDER — MIDAZOLAM HCL 2 MG/2ML IJ SOLN
INTRAMUSCULAR | Status: AC
  Administered 2018-03-21: 1 mg via INTRAVENOUS
  Filled 2018-03-21: qty 2

## 2018-03-21 MED ORDER — PROPOFOL 10 MG/ML IV BOLUS
INTRAVENOUS | Status: AC
  Filled 2018-03-21: qty 20

## 2018-03-21 MED ORDER — V-GO 20 KIT
1.0000 "application " | PACK | Freq: Every day | Status: DC
  Filled 2018-03-21: qty 1

## 2018-03-21 MED ORDER — DEXTROSE 50 % IV SOLN
25.0000 mL | Freq: Once | INTRAVENOUS | Status: AC
  Administered 2018-03-21: 25 mL via INTRAVENOUS
  Filled 2018-03-21: qty 50

## 2018-03-21 MED ORDER — PROPOFOL 10 MG/ML IV BOLUS
INTRAVENOUS | Status: AC
  Filled 2018-03-21: qty 40

## 2018-03-21 MED ORDER — 0.9 % SODIUM CHLORIDE (POUR BTL) OPTIME
TOPICAL | Status: DC | PRN
  Administered 2018-03-21: 1000 mL

## 2018-03-21 MED ORDER — FENTANYL CITRATE (PF) 100 MCG/2ML IJ SOLN
INTRAMUSCULAR | Status: AC
  Filled 2018-03-21: qty 2

## 2018-03-21 MED ORDER — MIDAZOLAM HCL 5 MG/5ML IJ SOLN
INTRAMUSCULAR | Status: DC | PRN
  Administered 2018-03-21: 1 mg via INTRAVENOUS

## 2018-03-21 MED ORDER — DEXTROSE-NACL 5-0.45 % IV SOLN
INTRAVENOUS | Status: DC
  Administered 2018-03-21 (×2): via INTRAVENOUS

## 2018-03-21 MED ORDER — AMLODIPINE BESYLATE 5 MG PO TABS
5.0000 mg | ORAL_TABLET | Freq: Every day | ORAL | Status: DC
  Administered 2018-03-21 – 2018-03-23 (×3): 5 mg via ORAL
  Filled 2018-03-21 (×3): qty 1

## 2018-03-21 MED ORDER — SODIUM CHLORIDE 0.9 % IR SOLN
Status: DC | PRN
  Administered 2018-03-21: 1000 mL

## 2018-03-21 MED ORDER — FENTANYL CITRATE (PF) 100 MCG/2ML IJ SOLN
INTRAMUSCULAR | Status: AC
  Administered 2018-03-21: 50 ug via INTRAVENOUS
  Filled 2018-03-21: qty 2

## 2018-03-21 MED ORDER — CEFAZOLIN SODIUM-DEXTROSE 2-4 GM/100ML-% IV SOLN
INTRAVENOUS | Status: AC
  Filled 2018-03-21: qty 200

## 2018-03-21 MED ORDER — DEXTROSE 5 % IV SOLN
INTRAVENOUS | Status: DC | PRN
  Administered 2018-03-21: 3 g via INTRAVENOUS

## 2018-03-21 MED ORDER — DEXTROSE 50 % IV SOLN
25.0000 mL | Freq: Once | INTRAVENOUS | Status: AC
  Administered 2018-03-21: 25 mL via INTRAVENOUS

## 2018-03-21 MED ORDER — DEXTROSE 50 % IV SOLN
25.0000 g | Freq: Once | INTRAVENOUS | Status: AC
  Administered 2018-03-21: 25 g via INTRAVENOUS

## 2018-03-21 MED ORDER — POVIDONE-IODINE 10 % EX SWAB: 2.0000 "application " | Freq: Once | CUTANEOUS | Status: DC

## 2018-03-21 MED ORDER — PHENYLEPHRINE 40 MCG/ML (10ML) SYRINGE FOR IV PUSH (FOR BLOOD PRESSURE SUPPORT)
PREFILLED_SYRINGE | INTRAVENOUS | Status: DC | PRN
  Administered 2018-03-21: 80 ug via INTRAVENOUS

## 2018-03-21 MED ORDER — DEXTROSE 50 % IV SOLN
25.0000 g | Freq: Once | INTRAVENOUS | Status: AC
  Administered 2018-03-21: 25 g via INTRAVENOUS
  Filled 2018-03-21: qty 50

## 2018-03-21 MED ORDER — FENTANYL CITRATE (PF) 100 MCG/2ML IJ SOLN: 25.0000 ug | INTRAMUSCULAR | Status: DC | PRN

## 2018-03-21 MED ORDER — PROMETHAZINE HCL 25 MG/ML IJ SOLN: 6.2500 mg | INTRAMUSCULAR | Status: DC | PRN

## 2018-03-21 MED ORDER — CEFAZOLIN SODIUM-DEXTROSE 2-4 GM/100ML-% IV SOLN: 2.0000 g | Freq: Once | INTRAVENOUS | Status: DC

## 2018-03-21 MED ORDER — ONDANSETRON HCL 4 MG/2ML IJ SOLN
INTRAMUSCULAR | Status: DC | PRN
  Administered 2018-03-21: 4 mg via INTRAVENOUS

## 2018-03-21 MED ORDER — MIDAZOLAM HCL 2 MG/2ML IJ SOLN
INTRAMUSCULAR | Status: AC
  Filled 2018-03-21: qty 2

## 2018-03-21 MED ORDER — STERILE WATER FOR IRRIGATION IR SOLN
Status: DC | PRN
  Administered 2018-03-21: 2000 mL

## 2018-03-21 MED ORDER — FENTANYL CITRATE (PF) 100 MCG/2ML IJ SOLN
50.0000 ug | INTRAMUSCULAR | Status: DC
  Administered 2018-03-21: 50 ug via INTRAVENOUS

## 2018-03-21 MED ORDER — MIDAZOLAM HCL 2 MG/2ML IJ SOLN
1.0000 mg | INTRAMUSCULAR | Status: DC
  Administered 2018-03-21: 1 mg via INTRAVENOUS

## 2018-03-21 MED ORDER — DEXTROSE 5 % IV SOLN: 3.0000 g | INTRAVENOUS | Status: DC

## 2018-03-21 MED ORDER — SODIUM CHLORIDE 0.9 % IV SOLN
INTRAVENOUS | Status: AC
  Filled 2018-03-21: qty 500000

## 2018-03-21 MED ORDER — PROPOFOL 500 MG/50ML IV EMUL
INTRAVENOUS | Status: DC | PRN
  Administered 2018-03-21: 50 ug/kg/min via INTRAVENOUS

## 2018-03-21 SURGICAL SUPPLY — 43 items
BAG SPEC THK2 15X12 ZIP CLS (MISCELLANEOUS) ×2
BAG ZIPLOCK 12X15 (MISCELLANEOUS) ×4 IMPLANT
BANDAGE ACE 6X5 VEL STRL LF (GAUZE/BANDAGES/DRESSINGS) ×2 IMPLANT
BANDAGE ESMARK 6X9 LF (GAUZE/BANDAGES/DRESSINGS) IMPLANT
BLADE SURG SZ10 CARB STEEL (BLADE) ×12 IMPLANT
BNDG CMPR 9X6 STRL LF SNTH (GAUZE/BANDAGES/DRESSINGS) ×2
BNDG ESMARK 6X9 LF (GAUZE/BANDAGES/DRESSINGS) ×4
CHLORAPREP W/TINT 26ML (MISCELLANEOUS) ×2 IMPLANT
COVER SURGICAL LIGHT HANDLE (MISCELLANEOUS) ×4 IMPLANT
DRAPE EXTREMITY T 121X128X90 (DRAPE) ×4 IMPLANT
DRAPE SHEET LG 3/4 BI-LAMINATE (DRAPES) ×2 IMPLANT
DRAPE SURG 17X11 SM STRL (DRAPES) ×4 IMPLANT
DRAPE U-SHAPE 47X51 STRL (DRAPES) ×2 IMPLANT
ELECT REM PT RETURN 15FT ADLT (MISCELLANEOUS) ×4 IMPLANT
GAUZE SPONGE 4X4 12PLY STRL (GAUZE/BANDAGES/DRESSINGS) ×6 IMPLANT
GAUZE XEROFORM 5X9 LF (GAUZE/BANDAGES/DRESSINGS) ×2 IMPLANT
GLOVE BIO SURGEON STRL SZ7.5 (GLOVE) ×2 IMPLANT
GLOVE BIOGEL PI IND STRL 7.5 (GLOVE) IMPLANT
GLOVE BIOGEL PI IND STRL 8 (GLOVE) IMPLANT
GLOVE BIOGEL PI INDICATOR 7.5 (GLOVE) ×6
GLOVE BIOGEL PI INDICATOR 8 (GLOVE) ×2
GLOVE SURG SS PI 7.5 STRL IVOR (GLOVE) ×2 IMPLANT
GOWN SPEC L3 XXLG W/TWL (GOWN DISPOSABLE) ×2 IMPLANT
GOWN STRL REUS W/TWL XL LVL3 (GOWN DISPOSABLE) ×2 IMPLANT
KIT BASIN OR (CUSTOM PROCEDURE TRAY) ×4 IMPLANT
MANIFOLD NEPTUNE II (INSTRUMENTS) ×4 IMPLANT
PACK TOTAL JOINT (CUSTOM PROCEDURE TRAY) ×4 IMPLANT
PAD ABD 8X10 STRL (GAUZE/BANDAGES/DRESSINGS) ×2 IMPLANT
PAD CAST 4YDX4 CTTN HI CHSV (CAST SUPPLIES) ×2 IMPLANT
PAD PREP 24X48 CUFFED NSTRL (MISCELLANEOUS) ×4 IMPLANT
PADDING CAST COTTON 4X4 STRL (CAST SUPPLIES) ×4
PADDING CAST SYNTHETIC 4 (CAST SUPPLIES) ×2
PADDING CAST SYNTHETIC 4X4 STR (CAST SUPPLIES) IMPLANT
POSITIONER SURGICAL ARM (MISCELLANEOUS) ×6 IMPLANT
SOL PREP PROV IODINE SCRUB 4OZ (MISCELLANEOUS) ×6 IMPLANT
SPONGE LAP 18X18 X RAY DECT (DISPOSABLE) ×6 IMPLANT
SUT ETHILON 2 0 PS N (SUTURE) ×4 IMPLANT
SUT PDS AB 0 CT1 36 (SUTURE) ×4 IMPLANT
SUT PDS AB 2-0 CT2 27 (SUTURE) ×4 IMPLANT
SUT PROLENE 3 0 PS 2 (SUTURE) ×4 IMPLANT
SUT SILK 3 0 12 30 (SUTURE) ×2 IMPLANT
TOWEL OR NON WOVEN STRL DISP B (DISPOSABLE) ×4 IMPLANT
TOWEL SURG RFD BLUE STRL DISP (DISPOSABLE) ×4 IMPLANT

## 2018-03-21 NOTE — Anesthesia Procedure Notes (Signed)
Anesthesia Procedure Image    

## 2018-03-21 NOTE — Anesthesia Preprocedure Evaluation (Addendum)
Anesthesia Evaluation  Patient identified by MRN, date of birth, ID band Patient awake    Reviewed: Allergy & Precautions, NPO status , Patient's Chart, lab work & pertinent test results  Airway Mallampati: II  TM Distance: >3 FB Neck ROM: Full    Dental no notable dental hx.    Pulmonary sleep apnea and Continuous Positive Airway Pressure Ventilation , former smoker,    breath sounds clear to auscultation + decreased breath sounds      Cardiovascular hypertension, + Peripheral Vascular Disease  Normal cardiovascular exam Rhythm:Regular Rate:Normal     Neuro/Psych negative neurological ROS  negative psych ROS   GI/Hepatic negative GI ROS, Neg liver ROS,   Endo/Other  diabetesMorbid obesity  Renal/GU negative Renal ROS  negative genitourinary   Musculoskeletal negative musculoskeletal ROS (+)   Abdominal (+) + obese,   Peds negative pediatric ROS (+)  Hematology  (+) anemia ,   Anesthesia Other Findings   Reproductive/Obstetrics negative OB ROS                             Anesthesia Physical Anesthesia Plan  ASA: IV  Anesthesia Plan: MAC   Post-op Pain Management:    Induction: Intravenous  PONV Risk Score and Plan: 0  Airway Management Planned: Simple Face Mask  Additional Equipment:   Intra-op Plan:   Post-operative Plan:   Informed Consent: I have reviewed the patients History and Physical, chart, labs and discussed the procedure including the risks, benefits and alternatives for the proposed anesthesia with the patient or authorized representative who has indicated his/her understanding and acceptance.   Dental advisory given  Plan Discussed with: CRNA and Surgeon  Anesthesia Plan Comments:         Anesthesia Quick Evaluation

## 2018-03-21 NOTE — Anesthesia Procedure Notes (Signed)
Anesthesia Regional Block: Popliteal block   Pre-Anesthetic Checklist: ,, timeout performed, Correct Patient, Correct Site, Correct Laterality, Correct Procedure, Correct Position, site marked, Risks and benefits discussed,  Surgical consent,  Pre-op evaluation,  At surgeon's request and post-op pain management  Laterality: Left  Prep: chloraprep       Needles:  Injection technique: Single-shot  Needle Type: Echogenic Needle     Needle Length: 9cm      Additional Needles:   Procedures:,,,, ultrasound used (permanent image in chart),,,,  Narrative:  Start time: 03/21/2018 4:54 PM End time: 03/21/2018 5:05 PM Injection made incrementally with aspirations every 5 mL.  Performed by: Personally  Anesthesiologist: Myrtie Soman, MD  Additional Notes: Patient tolerated the procedure well without complications

## 2018-03-21 NOTE — Progress Notes (Signed)
Hypoglycemic Event  CBG: CBG 59  Treatment: D50 IV 25 mL  Symptoms: None  Follow-up CBG: Time:1240 CBG Result:95  Possible Reasons for Event: Inadequate meal intake  Comments/MD notified: MD aware.    Tinnie Gens

## 2018-03-21 NOTE — Care Management Important Message (Signed)
Important Message  Patient Details  Name: [name] MRN: [id number] Date of Birth: Dec 21, 1950   Medicare Important Message Given:  Yes    Kerin Salen 03/21/2018, 12:08 [place] Message  Patient Details  Name: [name] MRN: [id number] Date of Birth: 01/20/1951   Medicare Important Message Given:  Yes    Kerin Salen 03/21/2018, 12:08 PM

## 2018-03-21 NOTE — Progress Notes (Signed)
ANTICOAGULATION CONSULT NOTE - [place] for heparin Indication: A-fib bridge therapy while Apixaban on hold for OR  Allergies  Allergen Reactions  . Vancomycin     REACTION: ARF    Patient Measurements: Height: 6\' 6"  (198.1 cm) Weight: (!) 405 lb 3.8 oz (183.8 kg) IBW/kg (Calculated) : 91.4 Heparin Dosing Weight: 135.1 kg  Vital Signs: Temp: 99.2 F (37.3 C) (09/09 0448) Temp Source: Oral (09/09 0448) BP: 148/81 (09/09 0448) Pulse Rate: 92 (09/09 0500)  Labs: Recent Labs    03/19/18 0611  03/19/18 1807 03/20/18 0627 03/20/18 1428 03/21/18 0527  HGB 9.8*  --   --  9.7*  --  9.5*  HCT 30.4*  --   --  30.6*  --  30.0*  PLT 258  --   --  273  --  295  APTT  --    < > 40* 58* 68* 114*  HEPARINUNFRC  --   --  2.16* 1.08*  --  0.87*  CREATININE 1.63*  --   --  1.47*  --  1.41*  1.41*   < > = values in this interval not displayed.    Estimated Creatinine Clearance: 91.1 mL/min (A) (by C-G formula based on SCr of 1.41 mg/dL (H)).   Medical History: Past Medical History:  Diagnosis Date  . Arthritis    "elbows & knees" (12/13/2014)  . Asthma   . CKD (chronic kidney disease), stage III ([place])   . DIABETIC FOOT ULCER 06/20/2009  . Edema, macular, due to secondary diabetes ([place])   . Erectile dysfunction   . GERD (gastroesophageal reflux disease)   . HEARING LOSS, SENSORINEURAL, BILATERAL 01/03/2007   Seen by ENT Dr. Orpah Greek D. Redmond Baseman 01/03/07  . Hemorrhoids   . History of echocardiogram    a. 04/2008 Echo: EF 50-55%, abnl LV relaxation, mildly dil LA.  Marland Kitchen Hyperlipidemia   . Hypertension   . Morbid obesity ([place])   . Neuropathy, lower extremity   . OSA (obstructive sleep apnea)    uses CPAP nightly  . OSA on CPAP    Nocturnal polysomnogram on 01/21/2010 showed severe obstructive sleep apnea/hypopnea syndrome, AHI 74.1 per hour with non positional events, moderately loud snoring, and oxygen desaturation to a nadir of 78% on room air.  CPAP was  successfully titrated to 17 CWP, AHI 1.1 per hour using a large ResMed Mirage Quattro full-face mask with heated humidifier. Bruxism was noted.   . Osteomyelitis of ankle and foot ([place])   . Retinopathy   . Type II diabetes mellitus (HCC)    w/complication NOS, type II   Assessment: 67 y/oM on Apixaban PTA for a-fib.  Pharmacy consulted to dose heparin for bridge therapy while Apixaban on hold for surgery. Last dose of Apixaban 5mg  PO BID was 9/7 at 1002. Left transmetatarsal amputation scheduled for Monday, 9/9, at 1700. Baseline heparin level falsely elevated, as expected, due to recent Apixaban use.    Today, 03/21/18:  AM Heparin level 0.87 units/mL which now correlates to AM aPTT = 114 seconds--> both are SUPRAtherapeutic on heparin infusion at 2200 units/hr  CBC: Hgb low/stable at 9.5, Pltc WNL  No bleeding or complications of therapy noted per nursing   Patient for OR later today with orders to stop heparin at 9am  Goal of Therapy:  Heparin level 0.3-0.7 units/ml aPTT 66-102 seconds Monitor platelets by anticoagulation protocol: Yes   Plan:   D/C heparin now since level elevated  F/U plans to resume  anticoagulation post-op   Netta Cedars, PharmD, BCPS Pager: [phone] 03/21/2018 7:10 AM

## 2018-03-21 NOTE — Progress Notes (Signed)
Hypoglycemic Event  CBG: CBG 68  Treatment: D50 IV 25 mL  Symptoms: None  Follow-up CBG: [id number] CBG Result:101  Possible Reasons for Event: Inadequate meal intake  Comments/MD notified:Dr. Nevada Crane notified.    Tinnie Gens

## 2018-03-21 NOTE — Brief Op Note (Signed)
03/21/2018  7:32 PM  PATIENT:  [name]  67 y.o. male  PRE-OPERATIVE DIAGNOSIS:   Left osteomyelitis second, third, and fourth metatarsals, chronic diabetic ulcer.  POST-OPERATIVE DIAGNOSIS:  Left osteomyelitis second, third, and fourth metatarsals, chronic diabetic ulcer, Achilles contracture  PROCEDURE:  Procedure(s) with comments: TRANSMETATARSAL AMPUTATION LEFT FOOT (Left) - Popliteal & ankle block ACHILLES TENDON LENGTHENING (Left)  SURGEON:  Surgeon(s) and Role:    Erle Crocker, MD - Primary  PHYSICIAN ASSISTANT: None  ASSISTANTS: none   ANESTHESIA:   general, popliteal and ankle block  EBL:  200 mL   BLOOD ADMINISTERED:none  DRAINS: none   LOCAL MEDICATIONS USED:  NONE  SPECIMEN:  Biopsy / Limited Resection  DISPOSITION OF SPECIMEN:  PATHOLOGY  COUNTS:  YES  TOURNIQUET:  * No tourniquets in log *  DICTATION: .Dragon Dictation  PLAN OF CARE: Admit to inpatient   PATIENT DISPOSITION:  PACU - hemodynamically stable.   Delay start of Pharmacological VTE agent (>24hrs) due to surgical blood loss or risk of bleeding: no -room anticoagulation 9 AM 03/22/2018

## 2018-03-21 NOTE — Care Management Note (Signed)
Case Management Note  Patient Details  Name: [name] MRN: [id number] Date of Birth: 1950/11/17  Subjective/Objective:                  67 y.o.malewith history of CKD, diabetes, GERD, [name], HTN, morbid obesity, OSA presents for evaluation of acute onset, progressively worsening left foot wound and left lower extremity edema. 7 days ago he followed up with his wound care physician Dr. Dellia Nims and had a cast removed for known foot ulcer overlying thethe plantar aspect ofhead ofthe left third metatarsal. At that time, his physician noted his wound had worsened and tracked all the way down to the bone. He obtained a sample of the boneon 8/29/19sent for culture to evaluate for osteomyelitis. Cultures came back positive for Proteus mirabilis, Pseudomonas aeruginosa, and MRSA. He has been on Levaquin and doxycycline for 2 days.   03/18/2018: Patient seen and examined at his bedside post MRI.  States his left foot pain is well controlled on current pain medications.  Denies any dyspnea or chest pain.  03/19/18: No acute events overnight.  Patient seen and examined at his bedside.  Reports his pain in his left foot is well controlled on current pain medications.  Denies chest pain, dyspnea or palpitations.  Surgery following plan for left transmetatarsal amputation on Monday 03/21/2018.  03/20/2018: No acute events overnight.  Holding off Eliquis.  On heparin drip anticipated surgery tomorrow.  Pain is well controlled.  No new complaints.  03/21/2018: Hypoglycemic overnight due to poor oral intake, states from being anxious about his surgery.  No new complaint.  Started on gentle IV hydration D5W and half-normal saline at 50 cc/h to avoid recurrent hypoglycemia.  Left transmetatarsal amputation planned this afternoon  Action/Plan: Assessment/Plan: Principal Problem:   Cellulitis and abscess of foot Active Problems:   Diabetes type 2, uncontrolled (HCC)   Obstructive sleep apnea  Diabetic peripheral neuropathy associated with type 2 diabetes mellitus (HCC)   Chronic kidney disease, stage III (moderate) (HCC)   Morbid obesity (HCC)   Hypertension   Foot ulcer, left (HCC)   Paroxysmal atrial fibrillation ([place]) Following for progression and cm needs Expected Discharge Date:  (unknown)               Expected Discharge Plan:     In-House Referral:     Discharge planning Services     Post Acute Care Choice:  Home Health Choice offered to:  Patient  DME Arranged:    DME Agency:     HH Arranged:    [place] Agency:  [facility] [employer]  Status of Service:  In process, will continue to follow  If discussed at Long Length of Stay Meetings, dates discussed:    Additional Comments:  Leeroy Cha, RN 03/21/2018, 3:03 PM

## 2018-03-21 NOTE — Telephone Encounter (Signed)
Call from patient this am is currently a patient at [facility] and is scheduled for surgery this afternoon for Amputation of toes.  Just wanted to let us know that he will not be here for appointment with Dr. Dareen Piano on tomorrow.  [name], RN 03/21/2018 11:15 AM .

## 2018-03-21 NOTE — Progress Notes (Signed)
ANTICOAGULATION CONSULT NOTE - [place] for heparin Indication: A-fib bridge therapy while Apixaban on hold for OR  Allergies  Allergen Reactions  . Vancomycin     REACTION: ARF    Patient Measurements: Height: 6\' 6"  (198.1 cm) Weight: (!) 405 lb 3.8 oz (183.8 kg) IBW/kg (Calculated) : 91.4 Heparin Dosing Weight: 135.1 kg  Vital Signs: Temp: 98.3 F (36.8 C) (09/09 2045) Temp Source: Oral (09/09 2045) BP: 146/82 (09/09 2045) Pulse Rate: 64 (09/09 2045)  Labs: Recent Labs    03/19/18 0611  03/19/18 1807 03/20/18 0627 03/20/18 1428 03/21/18 0527  HGB 9.8*  --   --  9.7*  --  9.5*  HCT 30.4*  --   --  30.6*  --  30.0*  PLT 258  --   --  273  --  295  APTT  --    < > 40* 58* 68* 114*  HEPARINUNFRC  --   --  2.16* 1.08*  --  0.87*  CREATININE 1.63*  --   --  1.47*  --  1.41*  1.41*   < > = values in this interval not displayed.    Estimated Creatinine Clearance: 91.1 mL/min (A) (by C-G formula based on SCr of 1.41 mg/dL (H)).   Medical History: Past Medical History:  Diagnosis Date  . Arthritis    "elbows & knees" (12/13/2014)  . Asthma   . CKD (chronic kidney disease), stage III ([place])   . DIABETIC FOOT ULCER 06/20/2009  . Edema, macular, due to secondary diabetes ([place])   . Erectile dysfunction   . GERD (gastroesophageal reflux disease)   . HEARING LOSS, SENSORINEURAL, BILATERAL 01/03/2007   Seen by ENT Dr. Orpah Greek D. Redmond Baseman 01/03/07  . Hemorrhoids   . History of echocardiogram    a. 04/2008 Echo: EF 50-55%, abnl LV relaxation, mildly dil LA.  Marland Kitchen Hyperlipidemia   . Hypertension   . Morbid obesity ([place])   . Neuropathy, lower extremity   . OSA (obstructive sleep apnea)    uses CPAP nightly  . OSA on CPAP    Nocturnal polysomnogram on 01/21/2010 showed severe obstructive sleep apnea/hypopnea syndrome, AHI 74.1 per hour with non positional events, moderately loud snoring, and oxygen desaturation to a nadir of 78% on room air.  CPAP was  successfully titrated to 17 CWP, AHI 1.1 per hour using a large ResMed Mirage Quattro full-face mask with heated humidifier. Bruxism was noted.   . Osteomyelitis of ankle and foot ([place])   . Retinopathy   . Type II diabetes mellitus (HCC)    w/complication NOS, type II   Assessment: 67 y/oM on Apixaban PTA for a-fib.  Pharmacy consulted to dose heparin for bridge therapy while Apixaban on hold for surgery. Last dose of Apixaban 5mg  PO BID was 9/7 at 1002. Left transmetatarsal amputation scheduled for Monday, 9/9, at 1700. Baseline heparin level falsely elevated, as expected, due to recent Apixaban use.    Today, 03/21/18:  AM Heparin level 0.87 units/mL which now correlates to AM aPTT = 114 seconds--> both are SUPRAtherapeutic on heparin infusion at 2300 units/hr  CBC: Hgb low/stable at 9.5, Pltc WNL  No bleeding or complications of therapy noted per nursing   Patient for OR later today with orders to stop heparin at 9am  9PM: s/p transmetatarsal amputation of left foot. Per Dr. Lucia Gaskins, will resume heparin 9/10 at 09:00  Goal of Therapy:  Heparin level 0.3-0.7 units/ml aPTT 66-102 seconds Monitor platelets by anticoagulation protocol:  Yes   Plan:   On 9/10, resume heparin without a bolus 2200 units/hr (lower rate since levels elevated this AM on 2300 units/hr)  Check heparin level 6hr after resuming heparin  Monitor closely for signs/symptoms of bleeding  F/u plans for restarting apixaban  Peggyann Juba, PharmD, BCPS Pager: [phone] 03/21/2018 8:55 PM

## 2018-03-21 NOTE — Progress Notes (Signed)
Hypoglycemic Event  CBG: 69  Treatment: D50 IV 25 mL  Symptoms: None  Follow-up CBG: Time:1616 CBG Result:86  Possible Reasons for Event: Inadequate meal intake  Comments/MD notified: MD aware.     Tinnie Gens

## 2018-03-21 NOTE — Progress Notes (Signed)
Inpatient Diabetes Program Recommendations  AACE/ADA: New Consensus Statement on Inpatient Glycemic Control (2015)  Target Ranges:  Prepandial:   less than 140 mg/dL      Peak postprandial:   less than 180 mg/dL (1-2 hours)      Critically ill patients:  140 - 180 mg/dL   Lab Results  Component Value Date   GLUCAP 59 (L) 03/21/2018   HGBA1C 8.0 (H) 02/03/2018    Review of Glycemic Control  Hypoglycemia this am.  Needs adjustment to Lantus bid dose  Inpatient Diabetes Program Recommendations:    Decrease Lantus to 42 units bid.  Will continue to follow.  Thank you. Lorenda Peck, RD, LDN, CDE Inpatient Diabetes Coordinator [phone]

## 2018-03-21 NOTE — Anesthesia Procedure Notes (Signed)
Date/Time: 03/21/2018 5:20 PM Performed by: Glory Buff, CRNA Oxygen Delivery Method: Simple face mask

## 2018-03-21 NOTE — Op Note (Signed)
03/21/2018  7:33 PM  PATIENT:  [name]  67 y.o. male  PRE-OPERATIVE DIAGNOSIS:   Left osteomyelitis second, third, and fourth metatarsals, chronic diabetic ulcer.  POST-OPERATIVE DIAGNOSIS:  Left osteomyelitis second, third, and fourth metatarsals, chronic diabetic ulcer, Achilles contracture  PROCEDURE:  Procedure(s) with comments: TRANSMETATARSAL AMPUTATION LEFT FOOT (Left) - Popliteal & ankle block ACHILLES TENDON LENGTHENING (Left)  SURGEON:  Surgeon(s) and Role:    Erle Crocker, MD - Primary  PHYSICIAN ASSISTANT: None  ASSISTANTS: none   ANESTHESIA:   general, popliteal and ankle block  EBL:  200 mL   BLOOD ADMINISTERED:none  DRAINS: none   LOCAL MEDICATIONS USED:  NONE  SPECIMEN:  Biopsy / Limited Resection  DISPOSITION OF SPECIMEN:  PATHOLOGY  COUNTS:  YES  TOURNIQUET:  None  FINDINGS: She was found to have an Achilles contracture with only neutral ankle dorsiflexion.  There was also a nonhealed plantar ulcer of the base of the second and third metatarsals.  There is some macerated skin and purulent drainage at the base of the fourth toe.  COMPLICATIONS: None   PROCEDURE: [name] is a 67 year old male with uncontrolled diabetes and neuropathy.  He has a chronic nonhealing ulcer on the plantar aspect of his left foot.  He was seeing podiatry and wound care unable to get the wound to heal.  He had had a prior second and third toe amputations. On recent wound care visit a bone biopsy was done which revealed osteomyelitis of the second metatarsal head.  It had an increase in his swelling as well as wound drainage and therefore was seen in the emergency department and admitted to the hospital.  Orthopedic evaluation MRI was done which demonstrated osteomyelitis present in the second third and fourth metatarsals that tracked up to the diaphysis of the bone.  After lengthy discussion it was decided that he would be best suited for a transmetatarsal  amputation with tendo Achilles lengthening given his Achilles contracture on exam.  Patient was informed of the risk benefits alternatives of surgery and wished to proceed.  PROCEDURE: She was seen in the preoperative holding area the consent was signed in the left lower extremity was marked.  Guarded in the OR he was seen by anesthesia where a popliteal block and an ankle block were done for anesthetic purposes.  In the operating room and moved onto the operative table in the supine position.  MAC anesthesia was induced.  A bump was placed under the left hip and all bony prominences were checked and padded.  The left leg was prepped with iodine prep and draped in the usual sterile fashion.  After draping a ChloraPrep stick was used as a secondary prep.  A surgical pause was performed that identified the correct surgical site and procedure to be performed.  The leg was elevated and it was determined that he had an Achilles contracture.  With ankle dorsiflexion getting just to neutral.  At 11 blade a tendo Achilles lengthening was performed using 3 hemisections in the Achilles tendon.  This resulted in a gain of 10 to 15 degrees of ankle dorsiflexion.  The leg was then lowered and the surgical site was marked out for the transmetatarsal irritation.  It was inspected and there was a nonhealed ulcer to the plantar aspect of the forefoot.  There was some purulent drainage there as well.  Is also some maceration and draining purulence at the base of the fourth toe.  Then using a 10 blade  the skin was incised sharply down to bone in a circumferential manner.  This was taken just proximal to the ulceration of the plantar aspect of his foot to allow for adequate skin coverage after the amputation.  The dorsal soft tissues were taken down to bone and elevated off the dorsal aspect of the metatarsal bones up to the proximal aspect of the first TMT joint.  Using a 10 blade the first TMT joint was identified and using a  sagittal saw the first metatarsal bone was cut about 1.5 cm distal to the first TMT joint.  Then in a cascading manner the second, third, fourth and fifth metatarsals were cut with a sagittal saw.  During cutting of the second third metatarsal there was a rush of edematous marrow fluid.  No obvious purulence noted.  After this the remaining stump was plantarflexed and the soft tissue was removed from the plantar aspect of the metatarsals and the forefoot was removed.  Then hemostasis was obtained using Hemostats and Bovie cautery.  Once adequate hemostasis was obtained the remaining foot was inspected and irrigated.  There is no evidence of significant purulence or necrotic tissue.  The dorsal and plantar flaps were then debulked and found to be adequate length to close over the amputation site.  Then 2 L of normal saline were used to irrigate the stump.  Then using a curette the second third and fourth metatarsals were cleaned out and biopsies were sent to pathology.  Then using 0 PDS the deep layers of tissue were closed and the skin was approximated in a tension-free manner.  Then 2-0 PDS was used for the subcuticular suture and a 2-0 nylon was used to close the skin.  The end of the case the counts were all correct.  After closure the foot was cleaned and Xeroform 4 x 4's ABD pad and sterile she cannot read used to dress the wound.  He was then placed in a short leg splint.  The patient was taken to the PACU in stable condition.  He will be readmitted to inpatient.  Okay to restart heparin drip 9 AM on 03-22-18  Postoperatively he will be nonweightbearing until the wound heals.  See him back in 2 weeks time for splint removal and wound check.  We will anticipate leaving the stitches in for 4 weeks time.

## 2018-03-21 NOTE — Progress Notes (Addendum)
Hypoglycemic Event  CBG: 63  Treatment: D50 IV 25 mL  Symptoms: Sweaty and Shaky  Follow-up CBG: Time: 0621 CBG Result: 86  Possible Reasons for Event: Inadequate meal intake and Change in activity  Comments/MD notified:Oncall made aware    Kizzie Ide

## 2018-03-21 NOTE — Transfer of Care (Signed)
Immediate Anesthesia Transfer of Care Note  Patient: PARTICK MUSSELMAN  Procedure(s) Performed: TRANSMETATARSAL AMPUTATION LEFT FOOT (Left Foot) ACHILLES TENDON LENGTHENING (Left Ankle)  Patient Location: PACU  Anesthesia Type:MAC combined with regional for post-op pain  Level of Consciousness: awake, alert , oriented and patient cooperative  Airway & Oxygen Therapy: Patient Spontanous Breathing and Patient connected to face mask oxygen  Post-op Assessment: Report given to RN and Post -op Vital signs reviewed and stable  Post vital signs: Reviewed and stable  Last Vitals:  Vitals Value Taken Time  BP 152/78 03/21/2018  7:32 PM  Temp    Pulse 67 03/21/2018  7:34 PM  Resp 14 03/21/2018  7:34 PM  SpO2 100 % 03/21/2018  7:34 PM  Vitals shown include unvalidated device data.  Last Pain:  Vitals:   03/21/18 1707  TempSrc:   PainSc: 0-No pain      Patients Stated Pain Goal: 0 (63/33/54 5625)  Complications: No apparent anesthesia complications

## 2018-03-21 NOTE — Progress Notes (Signed)
PROGRESS NOTE  [name] NKN:[id number] DOB: [birth year] DOA: 03/17/2018 PCP: Aldine Contes, MD  HPI/Recap of past 24 hours: [name]  is a 67 y.o. male  with history of CKD, diabetes, GERD, [name], HTN, morbid obesity, OSA presents for evaluation of acute onset, progressively worsening left foot wound and left lower extremity edema. 7 days ago he followed up with his wound care physician Dr. Dellia Nims and had a cast removed for known foot ulcer overlying thethe plantar aspect ofhead ofthe left third metatarsal. At that time, his physician noted his wound had worsened and tracked all the way down to the bone. He obtained a sample of the bone on 03/10/18 sent for culture to evaluate for osteomyelitis. Cultures came back positive for Proteus mirabilis, Pseudomonas aeruginosa, and MRSA. He has been on Levaquin and doxycycline for 2 days.   03/18/2018: Patient seen and examined at his bedside post MRI.  States his left foot pain is well controlled on current pain medications.  Denies any dyspnea or chest pain.  03/19/18: No acute events overnight.  Patient seen and examined at his bedside.  Reports his pain in his left foot is well controlled on current pain medications.  Denies chest pain, dyspnea or palpitations.  Surgery following plan for left transmetatarsal amputation on Monday 03/21/2018.  03/20/2018: No acute events overnight.  Holding off Eliquis.  On heparin drip anticipated surgery tomorrow.  Pain is well controlled.  No new complaints.  03/21/2018: Hypoglycemic overnight due to poor oral intake, states from being anxious about his surgery.  No new complaint.  Started on gentle IV hydration D5W and half-normal saline at 50 cc/h to avoid recurrent hypoglycemia.  Left transmetatarsal amputation planned this afternoon.  Assessment/Plan: Principal Problem:   Cellulitis and abscess of foot Active Problems:   Diabetes type 2, uncontrolled ([place])   Obstructive sleep apnea   Diabetic  peripheral neuropathy associated with type 2 diabetes mellitus (HCC)   Chronic kidney disease, stage III (moderate) (HCC)   Morbid obesity ([place])   Hypertension   Foot ulcer, left (HCC)   Paroxysmal atrial fibrillation (HCC)  Protein mirabilis, Pseudomonas aeruginosa, MRSA cellulitis and osteomyelitis of the left foot MRI done at [facility] on 03/18/2018 confirmed osteomyelitis affecting the fourth metatarsal head and fourth toe complicated by abscesses in the second and third metatarsal region and cellulitis blood cultures x2 peripherally done on 03/18/2018 no growth less than 24 hours Continue IV antibiotics on IV cefepime and IV vancomycin Monitor fever curve Monitor WBC Left transmetatarsal amputation planned 03/21/2018 by orthopedic surgery  Uncontrolled type 2 diabetes with hyperglycemia and transient hypoglycemia Last A1c from July 2019 revealed hemoglobin A1c of 8.0 Continue insulin sliding scale Avoid hyperglycemia to maximize wound healing Diabetes coordinator following.  Highly appreciated. Overnight blood sugar dropped in the 60s D5 W half-normal saline at 50 cc/h started on 03/21/2018 due to n.p.o. prior to surgery to avoid recurrent hypoglycemia  Paroxysmal A. fib Rate controlled on beta-blockers Hold Eliquis due to surgery planned tomorrow Continue heparin drip which is being managed by pharmacy. Hold heparin drip at 9 AM tomorrow 03/21/2018 in anticipation of surgery which is scheduled at 5 PM.  Morbid obesity BMI 46 Weight loss outpatient PT to assess  OSA CPAP at night  AKI on CKD 3 improving Baseline creatinine 1.6 with GFR of 49 Creatinine on presentation 1.91 with GFR of 40 Continue to avoid nephrotoxic agents/dehydration/hypotension Creatinine level trending down from 1.91 to 1.86 to 1.63 which is baseline Monitor urine output  Repeat BMP in the morning  Alcohol abuse Continue CIWA protocol Continue multivitamins including folic acid and thiamine  Chronic  normocytic anemia No sign of overt bleeding Hemoglobin 9.4 with baseline hemoglobin of 11 MCV 88 Repeat CBC in the morning  Chronic diastolic CHF Last 2D echo done on 12/14/2014 revealed preserved LVEF 55%, regional wall motion abnormalities could not be excluded. Strict I's and O's Daily weight  Code Status: Full code  Family Communication: None at bedside  Disposition Plan: Discharge to home in 2-3 days when clinically stable or when orthopedic surgery signs off   Consultants:  Orthopedic surgery  Procedures:  Left transmetatarsal amputation planned on 03/21/2018  Antimicrobials:  IV vancomycin and IV cefepime  DVT prophylaxis: Heparin drip   Objective: Vitals:   03/20/18 2100 03/21/18 0448 03/21/18 0500 03/21/18 1140  BP: (!) 157/85 (!) 148/81  (!) 147/69  Pulse: 64 (!) 112 92 67  Resp: 18 18  (!) 22  Temp: 99.8 F (37.7 C) 99.2 F (37.3 C)  98.2 F (36.8 C)  TempSrc: Oral Oral  Oral  SpO2: 100% 100%  100%  Weight:      Height:        Intake/Output Summary (Last 24 hours) at 03/21/2018 1418 Last data filed at 03/21/2018 0449 Gross per 24 hour  Intake 1278.51 ml  Output 1201 ml  Net 77.51 ml   Filed Weights   03/17/18 1500  Weight: (!) 183.8 kg    Exam:  . General: 67 y.o. year-old male obese male in no acute distress.  Alert oriented x3. . Cardiovascular: Regular rate and rhythm with no rubs or gallops.  No JVD or thyromegaly noted. Marland Kitchen Respiratory: Clear to auscultation with no wheezes or rales. Good inspiratory effort. . Abdomen: Soft nontender nondistended with normal bowel sounds x4 quadrants. . Musculoskeletal: 2+ pitting edema in lower extremities bilaterally worse on the left from toes up to knee. 2/4 pulses in all 4 extremities. . Skin: Open wound noted in plantar portion of left foot, missing toes. Marland Kitchen Psychiatry: Mood is appropriate for condition and setting   Data Reviewed: CBC: Recent Labs  Lab 03/17/18 1006 03/18/18 0621 03/19/18 0611  03/20/18 0627 03/21/18 0527  WBC 8.5 7.7 7.0 7.3 8.0  NEUTROABS 5.9  --   --   --   --   HGB 11.4* 9.4* 9.8* 9.7* 9.5*  HCT 35.0* 29.6* 30.4* 30.6* 30.0*  MCV 87.9 88.4 87.9 88.4 87.5  PLT 286 237 258 273 154   Basic Metabolic Panel: Recent Labs  Lab 03/17/18 1006 03/18/18 0621 03/19/18 0611 03/20/18 0627 03/21/18 0527  NA 140 138 138 140 139  K 4.6 4.6 4.9 4.5 4.0  CL 105 104 106 107 108  CO2 25 25 23 23 25   GLUCOSE 152* 173* 195* 101* 57*  BUN 24* 26* 23 22 20   CREATININE 1.91* 1.86* 1.63* 1.47* 1.41*  1.41*  CALCIUM 9.3 8.4* 8.6* 8.7* 8.5*  MG  --   --  2.1  --   --    GFR: Estimated Creatinine Clearance: 91.1 mL/min (A) (by C-G formula based on SCr of 1.41 mg/dL (H)). Liver Function Tests: No results for input(s): AST, ALT, ALKPHOS, BILITOT, PROT, ALBUMIN in the last 168 hours. No results for input(s): LIPASE, AMYLASE in the last 168 hours. No results for input(s): AMMONIA in the last 168 hours. Coagulation Profile: No results for input(s): INR, PROTIME in the last 168 hours. Cardiac Enzymes: No results for input(s): CKTOTAL, CKMB, CKMBINDEX, TROPONINI  in the last 168 hours. BNP (last 3 results) No results for input(s): PROBNP in the last 8760 hours. HbA1C: No results for input(s): HGBA1C in the last 72 hours. CBG: Recent Labs  Lab 03/21/18 0621 03/21/18 0805 03/21/18 0852 03/21/18 1143 03/21/18 1240  GLUCAP 86 68* 101* 59* 95   Lipid Profile: No results for input(s): CHOL, HDL, LDLCALC, TRIG, CHOLHDL, LDLDIRECT in the last 72 hours. Thyroid Function Tests: No results for input(s): TSH, T4TOTAL, FREET4, T3FREE, THYROIDAB in the last 72 hours. Anemia Panel: No results for input(s): VITAMINB12, FOLATE, FERRITIN, TIBC, IRON, RETICCTPCT in the last 72 hours. Urine analysis:    Component Value Date/Time   COLORURINE YELLOW 05/17/2015 1025   APPEARANCEUR CLEAR 05/17/2015 1025   LABSPEC >=1.030 (A) 05/17/2015 1025   PHURINE 5.5 05/17/2015 1025   GLUCOSEU  NEGATIVE 05/17/2015 1025   HGBUR MODERATE (A) 05/17/2015 1025   BILIRUBINUR NEGATIVE 05/17/2015 1025   KETONESUR NEGATIVE 05/17/2015 1025   PROTEINUR NEGATIVE 12/13/2014 2120   UROBILINOGEN 0.2 05/17/2015 1025   NITRITE NEGATIVE 05/17/2015 1025   LEUKOCYTESUR NEGATIVE 05/17/2015 1025   Sepsis Labs: @LABRCNTIP (procalcitonin:4,lacticidven:4)  ) Recent Results (from the past 240 hour(s))  MRSA PCR Screening     Status: None   Collection Time: 03/18/18 11:13 AM  Result Value Ref Range Status   MRSA by PCR NEGATIVE NEGATIVE Final    Comment:        The GeneXpert MRSA Assay (FDA approved for NASAL specimens only), is one component of a comprehensive MRSA colonization surveillance program. It is not intended to diagnose MRSA infection nor to guide or monitor treatment for MRSA infections. Performed at [facility], [place] [address] [place], [place] [address]   Culture, blood (routine x 2)     Status: None (Preliminary result)   Collection Time: 03/18/18  3:16 PM  Result Value Ref Range Status   Specimen Description   Final    BLOOD LEFT ANTECUBITAL Performed at [facility], [place] [address], [place], [place] [address]    Special Requests   Final    BOTTLES DRAWN AEROBIC AND ANAEROBIC Blood Culture adequate volume Performed at [facility] [address], [place], [place] [address]    Culture   Final    NO GROWTH 3 DAYS Performed at [facility], [place] [address], [place], [place] [address]    Report Status PENDING  Incomplete  Culture, blood (routine x 2)     Status: None (Preliminary result)   Collection Time: 03/18/18  3:18 PM  Result Value Ref Range Status   Specimen Description   Final    BLOOD RIGHT FOREARM Performed at [facility] [address], [place], [place] [address]    Special Requests   Final    BOTTLES DRAWN AEROBIC AND ANAEROBIC Blood Culture adequate volume Performed at [facility] [address] [place], [place] [address]    Culture   Final    NO GROWTH 3 DAYS Performed at [facility], [place] [address] [place], [place] [address]    Report Status PENDING  Incomplete      Studies: No results found.  Scheduled Meds: . amLODipine  5 mg Oral Daily  . atorvastatin  20 mg Oral q1800  . brinzolamide  1 drop Both Eyes BID  . cholecalciferol  2,000 Units Oral Daily  . folic acid  1 mg Oral Daily  . gabapentin  300 mg Oral  QHS  . insulin aspart  0-20 Units Subcutaneous TID WC  . insulin aspart  0-5 Units Subcutaneous QHS  . insulin aspart  12 Units Subcutaneous TID WC  . insulin glargine  45 Units Subcutaneous BID  . latanoprost  1 drop Both Eyes QHS  . metoprolol succinate  25 mg Oral q1800  . multivitamin with minerals  1 tablet Oral Daily  . polyethylene glycol  17 g Oral BID  . senna-docusate  2 tablet Oral BID  . sodium chloride flush  3 mL Intravenous Q12H  . thiamine  100 mg Oral Daily   Or  . thiamine  100 mg Intravenous Daily    Continuous Infusions: . sodium chloride    . ceFEPime (MAXIPIME) IV 2 g (03/21/18 1158)  . dextrose 5 % and 0.45% NaCl 50 mL/hr at 03/21/18 1126  . metronidazole 500 mg (03/21/18 0912)  . vancomycin 2,000 mg (03/21/18 1300)     LOS: 4 days     Kayleen Memos, MD [facility] Pager [phone]  If 7PM-7AM, please contact night-coverage [email or web address] Password [email or web address] 03/21/2018, 2:18 PM

## 2018-03-21 NOTE — Progress Notes (Signed)
Assisted Dr. Rose with left, ultrasound guided, popliteal block. Side rails up, monitors on throughout procedure. See vital signs in flow sheet. Tolerated Procedure well. 

## 2018-03-21 NOTE — Consult Note (Signed)
Reason for Consult: Left foot osteomyelitis in the setting of diabetic neuropathy Referring Physician: Dr. Red Christians is an 67 y.o. male.   Chief complaint: Chronic diabetic ulceration with underlying osteomyelitis.  HPI: Mr. Silvestro is a 67 year old male with known osteomyelitis of his left foot and the second, third and fourth metatarsals.  He is had a long-standing diabetic ulcer on the plantar aspect of his left foot.  He has been seeing [facility] for the past year without successful healing.  He is also been seeing wound care center again without successful healing.  On recent wound care visit bone biopsy revealed polymicrobial infection.  MRI that was obtained subsequently revealed osteomyelitis of the second, third and fourth metatarsals.  He had significant mass swelling.  He has about 4 out of 5 pain in the left foot.  Pain does radiate proximally due to the leg swelling.  He has had a history of prior toe amputations in the past.  He takes Eliquis chronically for heart disease.  Denies any fevers or chills.   MRI dated 03/18/2017 shows evidence of osteomyelitis of the second, third and fourth metatarsal bones.  There is also evidence of fluid collection dorsally and plantarly about the midfoot.  Assessment/Plan:  Chronic diabetic ulcer and osteomyelitis of the metatarsals in the setting of diabetic neuropathy.  I had a lengthy conversation with Mr. Kenley.  He has a difficult problem.  At this point I do not feel like we can control his osteomyelitis and chronic ulceration nonsurgically.  Is very adamant about keeping his leg and therefore we will plan for a transmetatarsal amputation.  He understands that this will be a proximal transmetatarsal amputation given the involvement of the metatarsal bones nearly up to the level of the TMT joints.  Postoperatively he will be nonweightbearing for 2-4 weeks to allow this wound to heal.  Currently we are holding his  heparin drip.  He is also n.p.o.. We discussed the risk benefits and alternatives to surgery and the risks include wound complications, persistent infection, skin breakdown or need for further surgery, ultimate conversion to below-knee amputation in the future.  There is also anesthetic risks that could include death at the time of surgery.  He understands these risks and wishes to proceed with surgery.  We will continue the antibiotics and appreciate pharmacy help for this. He will need to see physical therapy postoperatively and should obtain a rolling knee scooter wheelchair with leg rest. Anticipate a further 2 to 3-day hospital stay for therapy training.    Past Medical History:  Diagnosis Date  . Arthritis    "elbows & knees" (12/13/2014)  . Asthma   . CKD (chronic kidney disease), stage III ([place])   . DIABETIC FOOT ULCER 06/20/2009  . Edema, macular, due to secondary diabetes ([place])   . Erectile dysfunction   . GERD (gastroesophageal reflux disease)   . HEARING LOSS, SENSORINEURAL, BILATERAL 01/03/2007   Seen by ENT Dr. Orpah Greek D. Redmond Baseman 01/03/07  . Hemorrhoids   . History of echocardiogram    a. 04/2008 Echo: EF 50-55%, abnl LV relaxation, mildly dil LA.  Marland Kitchen Hyperlipidemia   . Hypertension   . Morbid obesity ([place])   . Neuropathy, lower extremity   . OSA (obstructive sleep apnea)    uses CPAP nightly  . OSA on CPAP    Nocturnal polysomnogram on 01/21/2010 showed severe obstructive sleep apnea/hypopnea syndrome, AHI 74.1 per hour with non positional events, moderately loud  snoring, and oxygen desaturation to a nadir of 78% on room air.  CPAP was successfully titrated to 17 CWP, AHI 1.1 per hour using a large ResMed Mirage Quattro full-face mask with heated humidifier. Bruxism was noted.   . Osteomyelitis of ankle and foot ([place])   . Retinopathy   . Type II diabetes mellitus (HCC)    w/complication NOS, type II    Past Surgical History:  Procedure Laterality Date  . AMPUTATION Left  12/15/2014   Procedure: LEFT SECOND TOE AMPUTATION ;  Surgeon: Dorna Leitz, MD;  Location: [facility];  Service: Orthopedics;  Laterality: Left;  . 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 Kathalene Frames. Mayer Camel.  . WOUND DEBRIDEMENT Left 06/15/2016   Procedure: DEBRIDEMENT WOUND LEFT FOOT WITH GRAFT APPLICATION;  Surgeon: Landis Martins, DPM;  Location: [facility];  Service: Podiatry;  Laterality: Left;    Family History  Problem Relation Age of Onset  . Diabetes Mother        also 2 siblings  . Heart attack Father 37  . Throat cancer Brother     Social History:  reports that he quit smoking about 8 years ago. His smoking use included cigarettes. He has a 15.00 pack-year smoking history. He has never used smokeless tobacco. He reports that he drinks about 11.0 standard drinks of alcohol per week. He reports that he does not use drugs.  Allergies:  Allergies  Allergen Reactions  . Vancomycin     REACTION: ARF    Medications: I have reviewed the patient's current medications.  Results for orders placed or performed during the hospital encounter of 03/17/18 (from the past 48 hour(s))  Glucose, capillary     Status: Abnormal   Collection Time: 03/19/18 11:51 AM  Result Value Ref Range   Glucose-Capillary 291 (H) 70 - 99 mg/dL   Comment 1 Notify RN   Glucose, capillary     Status: Abnormal   Collection Time: 03/19/18  4:12 PM  Result Value Ref Range   Glucose-Capillary 188 (H) 70 - 99 mg/dL  APTT     Status: Abnormal   Collection Time: 03/19/18  6:07 PM  Result Value Ref Range   aPTT 40 (H) 24 - 36 seconds    Comment:        IF BASELINE aPTT IS ELEVATED, SUGGEST PATIENT RISK ASSESSMENT BE USED TO DETERMINE APPROPRIATE ANTICOAGULANT THERAPY. Performed at [facility], [place] [address] [place], [place] [address]   Heparin level (unfractionated)     Status: Abnormal   Collection  Time: 03/19/18  6:07 PM  Result Value Ref Range   Heparin Unfractionated 2.16 (H) 0.30 - 0.70 IU/mL    Comment: RESULTS CONFIRMED BY MANUAL DILUTION (NOTE) If heparin results are below expected values, and patient dosage has  been confirmed, suggest follow up testing of antithrombin III levels. Performed at [facility], [place] [address] [place], [place] [address]   Glucose, capillary     Status: Abnormal   Collection Time: 03/19/18  8:21 PM  Result Value Ref Range   Glucose-Capillary 158 (H) 70 - 99 mg/dL  APTT     Status: Abnormal   Collection Time: 03/20/18  6:27 AM  Result Value Ref Range   aPTT 58 (H) 24 - 36 seconds    Comment:        IF BASELINE aPTT IS ELEVATED, SUGGEST PATIENT RISK ASSESSMENT BE  USED TO DETERMINE APPROPRIATE ANTICOAGULANT THERAPY. Performed at [facility], [place] [address], [place], [place] [address]   Heparin level (unfractionated)     Status: Abnormal   Collection Time: 03/20/18  6:27 AM  Result Value Ref Range   Heparin Unfractionated 1.08 (H) 0.30 - 0.70 IU/mL    Comment: (NOTE) If heparin results are below expected values, and patient dosage has  been confirmed, suggest follow up testing of antithrombin III levels. Performed at [facility], [place] [address] [place], [place] [address]   CBC     Status: Abnormal   Collection Time: 03/20/18  6:27 AM  Result Value Ref Range   WBC 7.3 4.0 - 10.5 K/uL   RBC 3.46 (L) 4.22 - 5.81 MIL/uL   Hemoglobin 9.7 (L) 13.0 - 17.0 g/dL   HCT 30.6 (L) 39.0 - 52.0 %   MCV 88.4 78.0 - 100.0 fL   MCH 28.0 26.0 - 34.0 pg   MCHC 31.7 30.0 - 36.0 g/dL   RDW 15.2 11.5 - 15.5 %   Platelets 273 150 - 400 K/uL    Comment: Performed at [facility], [place] [address] [place], [place] [address]  Basic metabolic panel     Status: Abnormal   Collection Time: 03/20/18  6:27 AM  Result Value Ref Range   Sodium 140 135 - 145 mmol/L   Potassium 4.5 3.5  - 5.1 mmol/L   Chloride 107 98 - 111 mmol/L   CO2 23 22 - 32 mmol/L   Glucose, Bld 101 (H) 70 - 99 mg/dL   BUN 22 8 - 23 mg/dL   Creatinine, Ser 1.47 (H) 0.61 - 1.24 mg/dL   Calcium 8.7 (L) 8.9 - 10.3 mg/dL   GFR calc non Af Amer 47 (L) >60 mL/min   GFR calc Af Amer 55 (L) >60 mL/min    Comment: (NOTE) The eGFR has been calculated using the CKD EPI equation. This calculation has not been validated in all clinical situations. eGFR's persistently <60 mL/min signify possible Chronic Kidney Disease.    Anion gap 10 5 - 15    Comment: Performed at [facility], [place] [address] [place], [place] [address]  Glucose, capillary     Status: None   Collection Time: 03/20/18  7:41 AM  Result Value Ref Range   Glucose-Capillary 93 70 - 99 mg/dL   Comment 1 Notify RN   Glucose, capillary     Status: Abnormal   Collection Time: 03/20/18 12:20 PM  Result Value Ref Range   Glucose-Capillary 179 (H) 70 - 99 mg/dL   Comment 1 Notify RN   APTT     Status: Abnormal   Collection Time: 03/20/18  2:28 PM  Result Value Ref Range   aPTT 68 (H) 24 - 36 seconds    Comment:        IF BASELINE aPTT IS ELEVATED, SUGGEST PATIENT RISK ASSESSMENT BE USED TO DETERMINE APPROPRIATE ANTICOAGULANT THERAPY. Performed at [facility], [place] [address] [place], [place] [address]   Glucose, capillary     Status: Abnormal   Collection Time: 03/20/18  5:05 PM  Result Value Ref Range   Glucose-Capillary 149 (H) 70 - 99 mg/dL   Comment 1 Notify RN   Glucose, capillary     Status: Abnormal   Collection Time: 03/20/18  8:09 PM  Result Value Ref Range   Glucose-Capillary 111 (H) 70 - 99 mg/dL  CBC  Status: Abnormal   Collection Time: 03/21/18  5:27 AM  Result Value Ref Range   WBC 8.0 4.0 - 10.5 K/uL   RBC 3.43 (L) 4.22 - 5.81 MIL/uL   Hemoglobin 9.5 (L) 13.0 - 17.0 g/dL   HCT 30.0 (L) 39.0 - 52.0 %   MCV 87.5 78.0 - 100.0 fL   MCH 27.7 26.0 - 34.0 pg   MCHC 31.7 30.0 -  36.0 g/dL   RDW 15.5 11.5 - 15.5 %   Platelets 295 150 - 400 K/uL    Comment: Performed at [facility], [place] [address], [place], [place] [address]  Heparin level (unfractionated)     Status: Abnormal   Collection Time: 03/21/18  5:27 AM  Result Value Ref Range   Heparin Unfractionated 0.87 (H) 0.30 - 0.70 IU/mL    Comment: (NOTE) If heparin results are below expected values, and patient dosage has  been confirmed, suggest follow up testing of antithrombin III levels. Performed at [facility], [place] [address] [place], [place] [address]   APTT     Status: Abnormal   Collection Time: 03/21/18  5:27 AM  Result Value Ref Range   aPTT 114 (H) 24 - 36 seconds    Comment:        IF BASELINE aPTT IS ELEVATED, SUGGEST PATIENT RISK ASSESSMENT BE USED TO DETERMINE APPROPRIATE ANTICOAGULANT THERAPY. Performed at [facility], [place] [address], [place], [place] [address]   Creatinine, serum     Status: Abnormal   Collection Time: 03/21/18  5:27 AM  Result Value Ref Range   Creatinine, Ser 1.41 (H) 0.61 - 1.24 mg/dL   GFR calc non Af Amer 50 (L) >60 mL/min   GFR calc Af Amer 58 (L) >60 mL/min    Comment: (NOTE) The eGFR has been calculated using the CKD EPI equation. This calculation has not been validated in all clinical situations. eGFR's persistently <60 mL/min signify possible Chronic Kidney Disease. Performed at [facility], [place] [address] [place], [place] [address]   Basic metabolic panel     Status: Abnormal   Collection Time: 03/21/18  5:27 AM  Result Value Ref Range   Sodium 139 135 - 145 mmol/L   Potassium 4.0 3.5 - 5.1 mmol/L   Chloride 108 98 - 111 mmol/L   CO2 25 22 - 32 mmol/L   Glucose, Bld 57 (L) 70 - 99 mg/dL   BUN 20 8 - 23 mg/dL   Creatinine, Ser 1.41 (H) 0.61 - 1.24 mg/dL   Calcium 8.5 (L) 8.9 - 10.3 mg/dL   GFR calc non Af Amer 50 (L) >60 mL/min   GFR calc Af Amer 58 (L) >60 mL/min     Comment: (NOTE) The eGFR has been calculated using the CKD EPI equation. This calculation has not been validated in all clinical situations. eGFR's persistently <60 mL/min signify possible Chronic Kidney Disease.    Anion gap 6 5 - 15    Comment: Performed at [facility], [place] [address], [place], [place] [address]  Glucose, capillary     Status: Abnormal   Collection Time: 03/21/18  5:59 AM  Result Value Ref Range   Glucose-Capillary 63 (L) 70 - 99 mg/dL  Glucose, capillary     Status: None   Collection Time: 03/21/18  6:21 AM  Result Value Ref Range   Glucose-Capillary 86 70 - 99 mg/dL  Glucose, capillary     Status: Abnormal  Collection Time: 03/21/18  8:05 AM  Result Value Ref Range   Glucose-Capillary 68 (L) 70 - 99 mg/dL  Glucose, capillary     Status: Abnormal   Collection Time: 03/21/18  8:52 AM  Result Value Ref Range   Glucose-Capillary 101 (H) 70 - 99 mg/dL   *Note: Due to a large number of results and/or encounters for the requested time period, some results have not been displayed. A complete set of results can be found in Results Review.    No results found.  Review of Systems  Constitutional: Negative.   HENT: Negative.   Eyes: Negative.   Respiratory: Negative.   Cardiovascular: Positive for leg swelling.  Musculoskeletal: Positive for joint pain.  Skin: Negative.   Neurological: Positive for weakness.  Endo/Heme/Allergies: Negative.   Psychiatric/Behavioral: Negative.    Blood pressure (!) 148/81, pulse 92, temperature 99.2 F (37.3 C), temperature source Oral, resp. rate 18, height '6\' 6"'$  (1.981 m), weight (!) 183.8 kg, SpO2 100 %. Physical Exam  Obese male no acute distress seated in the chair Awake and alert Oriented x 3 No scleral icterus Moist oral mucosa Respirations even and unlabored Skin is dry but no notable rashes Abdomen obese but nondistended Skeletal skeletal: Evaluation left lower extremity demonstrates  swelling about the leg and foot.  He is missing second and third toes.  On the plantar aspect of his forefoot he has an open ulceration with some bleeding.  This probes to bone.  He also has some maceration of the skin at the base of the fourth toe.  There is some drainage there.  No open wounds on the dorsum of his foot.  He has tenderness palpation proximally about the leg but no areas of fluctuance noted.  Slight sensory deficits due to his underlying neuropathy.  I am not able to palpate DP and PT pulses however there is a weak DP Doppler signal and biphasic PT signal.  Evaluation of the right foot demonstrates no notable skin lacerations.  No ulcerations.  Toe alignment well-maintained.  No areas of tenderness palpation.  Baseline sensory deficits.      Erle Crocker 03/21/2018, 9:58 AM

## 2018-03-22 ENCOUNTER — Encounter: Payer: Medicare Other | Admitting: Internal Medicine

## 2018-03-22 ENCOUNTER — Encounter (HOSPITAL_COMMUNITY): Payer: Self-pay | Admitting: Orthopaedic Surgery

## 2018-03-22 LAB — BASIC METABOLIC PANEL
Anion gap: 5 (ref 5–15)
BUN: 20 mg/dL (ref 8–23)
CALCIUM: 8 mg/dL — AB (ref 8.9–10.3)
CHLORIDE: 109 mmol/L (ref 98–111)
CO2: 24 mmol/L (ref 22–32)
CREATININE: 1.66 mg/dL — AB (ref 0.61–1.24)
GFR calc non Af Amer: 41 mL/min — ABNORMAL LOW (ref >60)
GFR, EST AFRICAN AMERICAN: 47 mL/min — AB (ref >60)
Glucose, Bld: 238 mg/dL — ABNORMAL HIGH (ref 70–99)
Potassium: 4.9 mmol/L (ref 3.5–5.1)
SODIUM: 138 mmol/L (ref 135–145)

## 2018-03-22 LAB — CBC
HEMATOCRIT: 25.2 % — AB (ref 39.0–52.0)
HEMOGLOBIN: 8.1 g/dL — AB (ref 13.0–17.0)
MCH: 28.6 pg (ref 26.0–34.0)
MCHC: 32.1 g/dL (ref 30.0–36.0)
MCV: 89 fL (ref 78.0–100.0)
Platelets: 217 10*3/uL (ref 150–400)
RBC: 2.83 MIL/uL — AB (ref 4.22–5.81)
RDW: 15.6 % — ABNORMAL HIGH (ref 11.5–15.5)
WBC: 6.7 10*3/uL (ref 4.0–10.5)

## 2018-03-22 LAB — GLUCOSE, CAPILLARY
GLUCOSE-CAPILLARY: 190 mg/dL — AB (ref 70–99)
GLUCOSE-CAPILLARY: 158 mg/dL — AB (ref 70–99)
Glucose-Capillary: 230 mg/dL — ABNORMAL HIGH (ref 70–99)
Glucose-Capillary: 161 mg/dL — ABNORMAL HIGH (ref 70–99)
Glucose-Capillary: 151 mg/dL — ABNORMAL HIGH (ref 70–99)
Glucose-Capillary: 107 mg/dL — ABNORMAL HIGH (ref 70–99)

## 2018-03-22 MED ORDER — APIXABAN 5 MG PO TABS
5.0000 mg | ORAL_TABLET | Freq: Two times a day (BID) | ORAL | Status: DC
  Administered 2018-03-22 – 2018-03-24 (×4): 5 mg via ORAL
  Filled 2018-03-22 (×4): qty 1

## 2018-03-22 NOTE — Evaluation (Signed)
Physical Therapy Evaluation Patient Details Name: [name] MRN: [id number] DOB: 1951-02-10 Today's Date: 03/22/2018   History of Present Illness  67 y.o. male with PMH of DM, obesity admitted with osteomyelitis L foot, s/p transmetatarsal amputation and gastroc lengthening. NWB LLE.   Clinical Impression  Pt admitted with above diagnosis. Pt currently with functional limitations due to the deficits listed below (see PT Problem List). +2 min assist for sit to stand from elevated bed, and to take 3 hopping steps with RW, distance limited by RLE fatigue. Pt mildly unsteady with ambulation and is a fall risk. ST-SNF recommended as his mobility is significantly limited and he is at risk for falls. Pt will benefit from skilled PT to increase their independence and safety with mobility to allow discharge to the venue listed below.       Follow Up Recommendations SNF;Supervision for mobility/OOB    Equipment Recommendations  Wheelchair (measurements PT);3in1 (PT);Rolling walker with 5" wheels(bariatric equipment)    Recommendations for Other Services       Precautions / Restrictions Precautions Precautions: Fall Precaution Comments: 1 fall in past 1 year Restrictions Weight Bearing Restrictions: Yes LLE Weight Bearing: Non weight bearing      Mobility  Bed Mobility Overal bed mobility: Modified Independent             General bed mobility comments: HOB up 40*, used rail  Transfers Overall transfer level: Needs assistance Equipment used: Rolling walker (2 wheeled) Transfers: Sit to/from Stand Sit to Stand: +2 safety/equipment;From elevated surface;Min assist         General transfer comment: assist to rise and steady from elevated bed  Ambulation/Gait Ambulation/Gait assistance: Min assist;+2 safety/equipment;+2 physical assistance Gait Distance (Feet): 3 Feet Assistive device: Rolling walker (2 wheeled) Gait Pattern/deviations: Step-to pattern     General  Gait Details: +2 for safety, mild unsteadiness, pt took 3 hops with RLE and bari RW, distance limited by RLE fatigue, good adherence to NWB status LLE  Stairs            Wheelchair Mobility    Modified Rankin (Stroke Patients Only)       Balance Overall balance assessment: Needs assistance   Sitting balance-Leahy Scale: Good     Standing balance support: Bilateral upper extremity supported Standing balance-Leahy Scale: Poor Standing balance comment: relies on BUE support                             Pertinent Vitals/Pain Pain Assessment: 0-10 Pain Score: 4  Pain Location: LLE Pain Descriptors / Indicators: Sore Pain Intervention(s): Limited activity within patient's tolerance;Monitored during session;Premedicated before session;Repositioned    Home Living Family/patient expects to be discharged to:: Private residence Living Arrangements: Spouse/significant other Available Help at Discharge: Family;Available 24 hours/day   Home Access: Stairs to enter Entrance Stairs-Rails: Left;Right;Can reach both Entrance Stairs-Number of Steps: 3 Home Layout: Two level;Able to live on main level with bedroom/bathroom Home Equipment: Cane - single point      Prior Function Level of Independence: Independent with assistive device(s)         Comments: sponge bathed or had assistance to bathe in shower; walked with SPC and L boot     Hand Dominance        Extremity/Trunk Assessment   Upper Extremity Assessment Upper Extremity Assessment: Overall WFL for tasks assessed    Lower Extremity Assessment Lower Extremity Assessment: LLE deficits/detail LLE Deficits / Details: SLR 3/5,  knee ext 4/5     Cervical / Trunk Assessment Cervical / Trunk Assessment: Normal  Communication   Communication: HOH  Cognition Arousal/Alertness: Awake/alert Behavior During Therapy: WFL for tasks assessed/performed Overall Cognitive Status: Within Functional Limits for  tasks assessed                                        General Comments      Exercises     Assessment/Plan    PT Assessment Patient needs continued PT services  PT Problem List Decreased strength;Decreased activity tolerance;Decreased mobility;Decreased balance;Obesity;Pain       PT Treatment Interventions DME instruction;Gait training;Functional mobility training;Therapeutic activities;Therapeutic exercise;Balance training;Patient/family education;Wheelchair mobility training    PT Goals (Current goals can be found in the Care Plan section)  Acute Rehab PT Goals Patient Stated Goal: walk more to loose weight, go to [facility] PT Goal Formulation: With patient Time For Goal Achievement: 04/05/18 Potential to Achieve Goals: Good    Frequency Min 3X/week   Barriers to discharge        Co-evaluation               AM-PAC PT "6 Clicks" Daily Activity  Outcome Measure Difficulty turning over in bed (including adjusting bedclothes, sheets and blankets)?: A Little Difficulty moving from lying on back to sitting on the side of the bed? : A Little Difficulty sitting down on and standing up from a chair with arms (e.g., wheelchair, bedside commode, etc,.)?: Unable Help needed moving to and from a bed to chair (including a wheelchair)?: A Lot Help needed walking in hospital room?: A Lot Help needed climbing 3-5 steps with a railing? : Total 6 Click Score: 12    End of Session Equipment Utilized During Treatment: Gait belt Activity Tolerance: Patient limited by fatigue Patient left: in chair;with call bell/phone within reach Nurse Communication: Mobility status PT Visit Diagnosis: Unsteadiness on feet (R26.81);Difficulty in walking, not elsewhere classified (R26.2);Pain Pain - Right/Left: Left Pain - part of body: Ankle and joints of foot    Time: 0973-5329 PT Time Calculation (min) (ACUTE ONLY): 29 min   Charges:   PT Evaluation $PT Eval Moderate  Complexity: 1 Mod PT Treatments $Therapeutic Activity: 8-22 mins        Blondell Reveal Kistler 03/22/2018, 11:46 AM [phone]

## 2018-03-22 NOTE — Progress Notes (Signed)
Inpatient Diabetes Program Recommendations  AACE/ADA: New Consensus Statement on Inpatient Glycemic Control (2015)  Target Ranges:  Prepandial:   less than 140 mg/dL      Peak postprandial:   less than 180 mg/dL (1-2 hours)      Critically ill patients:  140 - 180 mg/dL   Lab Results  Component Value Date   GLUCAP 161 (H) 03/22/2018   HGBA1C 8.0 (H) 02/03/2018    Review of Glycemic Control  Did not receive Lantus 45 units QHS on 9/9 until 0500. Received am Lantus at 0920, with total of 90 units within 4.5 hours. Alerted RN regarding same.   HgbA1C - 8.0%.  Inpatient Diabetes Program Recommendations:     Will restart V-Go pump at home.  Continue to follow.   Thank you. Lorenda Peck, RD, LDN, CDE Inpatient Diabetes Coordinator [phone]

## 2018-03-22 NOTE — Consult Note (Signed)
   [facility] CM Inpatient Consult   03/22/2018  [name] Dec 05, 1950 [id number]   Patient screened for [facility] [facility] program due to having unplanned readmission risk score of 22% (high). Went to bedside to speak with [name] about [place] [facility] program. He is agreeable and written [facility] Care Management consent obtained. [facility] folder provided along with contact information.   [name] reports his discharge plan is for SNF. States he will call [facility] post SNF discharge. He is unsure which facility it will be at this point however.  He states he lived with wife prior to admission. Confirmed Primary Care MD is with [facility] (office is listed as doing transition of care calls).   [name] is s/p left foot transmetatarsal amputation with a medical history of DM, OSA, CKD, HTN, morbid obesity, paroxysmal atrial fib.  Will continue to follow along for disposition planning progression.    Marthenia Rolling, MSN-Ed, RN,BSN [facility] Liaison [phone]

## 2018-03-22 NOTE — Progress Notes (Signed)
Subjective: 1 Day Post-Op Procedure(s) (LRB): TRANSMETATARSAL AMPUTATION LEFT FOOT (Left) ACHILLES TENDON LENGTHENING (Left) Patient reports pain as 0 on 0-10 scale.    No fevers or chills.  Feels good this am.   Objective: Vital signs in last 24 hours: Temp:  [97.7 F (36.5 C)-98.5 F (36.9 C)] 98.2 F (36.8 C) (09/10 0501) Pulse Rate:  [64-76] 69 (09/10 0501) Resp:  [14-24] 21 (09/10 0501) BP: (138-171)/(62-93) 138/62 (09/10 0501) SpO2:  [99 %-100 %] 100 % (09/10 0501)  Intake/Output from previous day: 09/09 0701 - 09/10 0700 In: 2107.8 [I.V.:1307.8; IV Piggyback:800.1] Out: 200 [Blood:200] Intake/Output this shift: No intake/output data recorded.  Recent Labs    03/20/18 0627 03/21/18 0527 03/22/18 0542  HGB 9.7* 9.5* 8.1*   Recent Labs    03/21/18 0527 03/22/18 0542  WBC 8.0 6.7  RBC 3.43* 2.83*  HCT 30.0* 25.2*  PLT 295 217   Recent Labs    03/21/18 0527 03/22/18 0542  NA 139 138  K 4.0 4.9  CL 108 109  CO2 25 24  BUN 20 20  CREATININE 1.41*  1.41* 1.66*  GLUCOSE 57* 238*  CALCIUM 8.5* 8.0*   No results for input(s): LABPT, INR in the last 72 hours.  Patient is awake and alert.  No acute distress.  Even unlabored. Left lower extremity in splint.  No strikethrough or notable drainage.   Assessment/Plan: 1 Day Post-Op Procedure(s) (LRB): TRANSMETATARSAL AMPUTATION LEFT FOOT (Left) ACHILLES TENDON LENGTHENING (Left) Up with therapy   Nonweightbearing left lower extremity.   Work with physical therapy for mobilization.   Okay to restart anticoagulation today at 9 PM.  Continue antibiotics. Follow-up pathology results from operative specimen. Medical management per medicine team.  Appreciate their assistance. Discharge when cleared by physical therapy and medicine team.   Okay for discharge per orthopedics.  Follow-up with me in 2 weeks.    Erle Crocker 03/22/2018, 7:42 AM

## 2018-03-22 NOTE — Progress Notes (Signed)
ANTICOAGULATION CONSULT NOTE - Initial Consult  Pharmacy Consult for apixaban Indication: atrial fibrillation  Allergies  Allergen Reactions  . Vancomycin     REACTION: ARF    Patient Measurements: Height: _0  (198.1 cm) Weight: (!) 405 lb 3.8 oz (183.8 kg) IBW/kg (Calculated) : 91.4 Heparin Dosing Weight:   Vital Signs: Temp: 98.2 F (36.8 C) (09/10 0501) Temp Source: Oral (09/10 0501) BP: 138/62 (09/10 0501) Pulse Rate: 69 (09/10 0501)  Labs: Recent Labs    03/19/18 1807  03/20/18 0627 03/20/18 1428 03/21/18 0527 03/22/18 0542  HGB  --    < > 9.7*  --  9.5* 8.1*  HCT  --   --  30.6*  --  30.0* 25.2*  PLT  --   --  273  --  295 217  APTT 40*  --  58* 68* 114*  --   HEPARINUNFRC 2.16*  --  1.08*  --  0.87*  --   CREATININE  --   --  1.47*  --  1.41*  1.41* 1.66*   < > = values in this interval not displayed.    Estimated Creatinine Clearance: 77.3 mL/min (A) (by C-G formula based on SCr of 1.66 mg/dL (H)).   Medical History: Past Medical History:  Diagnosis Date  . Arthritis    "elbows & knees" (12/13/2014)  . Asthma   . CKD (chronic kidney disease), stage III ([place])   . DIABETIC FOOT ULCER 06/20/2009  . Edema, macular, due to secondary diabetes ([place])   . Erectile dysfunction   . GERD (gastroesophageal reflux disease)   . HEARING LOSS, SENSORINEURAL, BILATERAL 01/03/2007   Seen by ENT Dr. Orpah Greek D. [place] Baseman 01/03/07  . Hemorrhoids   . History of echocardiogram    a. 04/2008 Echo: EF 50-55%, abnl LV relaxation, mildly dil LA.  Marland Kitchen Hyperlipidemia   . Hypertension   . Morbid obesity ([place])   . Neuropathy, lower extremity   . OSA (obstructive sleep apnea)    uses CPAP nightly  . OSA on CPAP    Nocturnal polysomnogram on 01/21/2010 showed severe obstructive sleep apnea/hypopnea syndrome, AHI 74.1 per hour with non positional events, moderately loud snoring, and oxygen desaturation to a nadir of 78% on room air.  CPAP was successfully titrated to 17 CWP, AHI 1.1  per hour using a large ResMed Mirage Quattro full-face mask with heated humidifier. Bruxism was noted.   . Osteomyelitis of ankle and foot ([place])   . Retinopathy   . Type II diabetes mellitus (HCC)    w/complication NOS, type II    Medications:  Medications Prior to Admission  Medication Sig Dispense Refill Last Dose  . albuterol (PROAIR HFA) 108 (90 Base) MCG/ACT inhaler Inhale 2 puffs into the lungs every 6 (six) hours as needed for wheezing or shortness of breath. 24.7 g 3 03/16/2018 at Unknown time  . atorvastatin (LIPITOR) 20 MG tablet TAKE 1 TABLET(20 MG) BY MOUTH DAILY 90 tablet 1 03/16/2018 at Unknown time  . AZOPT 1 % ophthalmic suspension Place 1 drop into both eyes 2 (two) times daily.    03/16/2018 at Unknown time  . Cholecalciferol (VITAMIN D3) 2000 UNITS capsule Take 1 capsule (2,000 Units total) by mouth daily. 90 capsule 0 03/17/2018 at Unknown time  . diclofenac sodium (VOLTAREN) 1 % GEL Apply 4 g topically 4 (four) times daily. 100 g 2 03/16/2018 at Unknown time  . docusate sodium (COLACE) 100 MG capsule Take 1 capsule (100 mg total) by mouth daily  as needed for mild constipation. 14 capsule 0 Past Month at Unknown time  . doxycycline (MONODOX) 100 MG capsule Take 100 mg by mouth 2 (two) times daily.   0 03/16/2018 at Unknown time  . ELIQUIS 5 MG TABS tablet TAKE 1 TABLET(5 MG) BY MOUTH TWICE DAILY (Patient taking differently: Take 5 mg by mouth 2 (two) times daily. ) 180 tablet 2 03/16/2018 at Unknown time  . enalapril (VASOTEC) 20 MG tablet TAKE 2 TABLETS(40 MG) BY MOUTH DAILY (Patient taking differently: Take 40 mg by mouth daily. ) 180 tablet 0 03/17/2018 at Unknown time  . furosemide (LASIX) 40 MG tablet TAKE 1 TABLET(40 MG) BY MOUTH TWICE DAILY (Patient taking differently: Take 40 mg by mouth 2 (two) times daily. ) 60 tablet 3 03/17/2018 at Unknown time  . gabapentin (NEURONTIN) 300 MG capsule TAKE 1 CAPSULE(300 MG) BY MOUTH AT BEDTIME 90 capsule 3 03/16/2018 at Unknown time  . HUMULIN R 500  UNIT/ML injection DRAW INSULIN TO THE 68 UNIT LINE ON THE U-100 SYRINGE(TO USE 340 UNITS) PER DAY AS DIRECTED (Patient taking differently: Inject 340 Units into the skin continuous. ) 20 mL 0 03/17/2018 at Unknown time  . HYDROcodone-acetaminophen (NORCO) 7.5-325 MG tablet Take 1 tablet by mouth every 6 (six) hours as needed for moderate pain. 130 tablet 0 03/16/2018 at Unknown time  . latanoprost (XALATAN) 0.005 % ophthalmic solution Place 1 drop into both eyes at bedtime.   0 03/16/2018 at Unknown time  . levofloxacin (LEVAQUIN) 500 MG tablet Take 500 mg by mouth daily.   0 03/16/2018 at Unknown time  . metoprolol succinate (TOPROL-XL) 25 MG 24 hr tablet TAKE 1 TABLET(25 MG) BY MOUTH DAILY 90 tablet 3 03/16/2018 at 5pm  . promethazine (PHENERGAN) 25 MG tablet TAKE 1 TABLET BY MOUTH  EVERY 8 HOURS AS NEEDED FOR NAUSEA OR VOMITING 90 tablet 0 Past Week at Unknown time  . SIMBRINZA 1-0.2 % SUSP Place 1 drop into both eyes 2 (two) times daily.    03/16/2018 at Unknown time  . VICTOZA 18 MG/3ML SOPN INJECT 1.8 MG ONCE DAILY AT THE SAME TIME (Patient taking differently: Inject 1.8 mg into the skin at bedtime. Inject 1.8 mg once daily at the same time) 9 mL 3 03/16/2018 at Unknown time  . ACCU-CHEK AVIVA PLUS test strip USE TO CHECK BLOOD FIVE TIMES DAILY 450 each 1 Taking  . ACCU-CHEK FASTCLIX LANCETS MISC Check blood sugar 5 times a day 408 each 3 Taking  . becaplermin (REGRANEX) 0.01 % gel Apply 1 application topically daily. (Patient not taking: Reported on 03/17/2018) 15 g 5 Completed Course at Unknown time  . Blood Glucose Monitoring Suppl (ACCU-CHEK AVIVA PLUS) w/Device KIT CHECK BLOOD SUGAR 5 TIMES A DAY 1 kit 0 Taking  . Continuous Blood Gluc Sensor (FREESTYLE LIBRE 14 DAY SENSOR) MISC 1 each by Does not apply route 6 (six) times daily. 2 each 12 Taking  . HUMULIN R 500 UNIT/ML injection DRAW INSULIN TO THE 68 UNIT LINE ON THE U-100 SYRINGE(TO USE 340 UNITS) PER DAY AS DIRECTED (Patient not taking: Reported on  03/17/2018) 20 mL 0 Not Taking at Unknown time  . HUMULIN R U-500 KWIKPEN 500 UNIT/ML kwikpen INJECT 35 UNITS AT BREAKFAST, 60 UNITS AT LUNCH AND 70 UNITS AT DINNER (Patient taking differently: Inject 35-65 Units into the skin 2 (two) times daily with a meal. Only if pump runs out) 24 mL 0 long time ago  . silver sulfADIAZINE (SILVADENE) 1 % cream  Apply 1 application topically daily. (Patient not taking: Reported on 03/17/2018) 50 g 1 Completed Course at Unknown time   Scheduled:  . amLODipine  5 mg Oral Daily  . apixaban  5 mg Oral BID  . atorvastatin  20 mg Oral q1800  . brinzolamide  1 drop Both Eyes BID  . cholecalciferol  2,000 Units Oral Daily  . fentaNYL (SUBLIMAZE) injection  50-100 mcg Intravenous UD  . folic acid  1 mg Oral Daily  . gabapentin  300 mg Oral QHS  . insulin aspart  0-20 Units Subcutaneous TID WC  . insulin aspart  0-5 Units Subcutaneous QHS  . insulin aspart  12 Units Subcutaneous TID WC  . insulin glargine  45 Units Subcutaneous BID  . latanoprost  1 drop Both Eyes QHS  . metoprolol succinate  25 mg Oral q1800  . midazolam  1-2 mg Intravenous UD  . multivitamin with minerals  1 tablet Oral Daily  . polyethylene glycol  17 g Oral BID  . senna-docusate  2 tablet Oral BID  . sodium chloride flush  3 mL Intravenous Q12H  . thiamine  100 mg Oral Daily   Or  . thiamine  100 mg Intravenous Daily  . V-GO 20  1 application Continuous infusion (non-IV) Daily    Assessment: Patient is post procedure and MD directed pharmacy to restart apixaban vs heparin tonight 9/10.    Goal of Therapy:  Safe and effective use of apixaban    Plan:  Apixaban 62m po bid starting at [address] JShea StakesCrowford 03/22/2018,6:58 AM

## 2018-03-22 NOTE — Progress Notes (Signed)
PROGRESS NOTE  [name] VQQ:[id number] DOB: Oct 20, 1950 DOA: 03/17/2018 PCP: Aldine Contes, MD  HPI/Recap of past 24 hours: [name]  is a 67 y.o. male  with history of CKD, diabetes, GERD, [name], HTN, morbid obesity, OSA presents for evaluation of acute onset, progressively worsening left foot wound and left lower extremity edema. 7 days ago he followed up with his wound care physician Dr. Dellia Nims and had a cast removed for known foot ulcer overlying thethe plantar aspect ofhead ofthe left third metatarsal. At that time, his physician noted his wound had worsened and tracked all the way down to the bone. He obtained a sample of the bone on 03/10/18 sent for culture to evaluate for osteomyelitis. Cultures came back positive for Proteus mirabilis, Pseudomonas aeruginosa, and MRSA. He has been on Levaquin and doxycycline for 2 days.   03/18/2018: Patient seen and examined at his bedside post MRI.  States his left foot pain is well controlled on current pain medications.  Denies any dyspnea or chest pain.  03/19/18: No acute events overnight.  Patient seen and examined at his bedside.  Reports his pain in his left foot is well controlled on current pain medications.  Denies chest pain, dyspnea or palpitations.  Surgery following plan for left transmetatarsal amputation on Monday 03/21/2018.  03/20/2018: No acute events overnight.  Holding off Eliquis.  On heparin drip anticipated surgery tomorrow.  Pain is well controlled.  No new complaints.  03/21/2018: Hypoglycemic overnight due to poor oral intake, states from being anxious about his surgery.  No new complaint.  Started on gentle IV hydration D5W and half-normal saline at 50 cc/h to avoid recurrent hypoglycemia.  Left transmetatarsal amputation planned this afternoon.  03/22/18: D5w1/2NS stopped due to hyperglycemia. Describes shooting pain at his left foot. Pain management in place.  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 (HCC)   Chronic kidney disease, stage III (moderate) (HCC)   Morbid obesity ([place])   Hypertension   Foot ulcer, left (HCC)   Paroxysmal atrial fibrillation (HCC)  POD#1 post left metatarsal amputation from protein mirabilis, Pseudomonas aeruginosa, MRSA cellulitis and osteomyelitis of the left foot MRI done at [facility] on 03/18/2018 confirmed osteomyelitis affecting the fourth metatarsal head and fourth toe complicated by abscesses in the second and third metatarsal region and cellulitis blood cultures x2 peripherally done on 03/18/2018 no growth less than 24 hours Continue IV antibiotics on IV cefepime and IV vancomycin Monitor fever curve Monitor WBC Left transmetatarsal amputation planned 03/21/2018 by orthopedic surgery  Uncontrolled type 2 diabetes with hyperglycemia and transient hypoglycemia Last A1c from July 2019 revealed hemoglobin A1c of 8.0 Continue insulin sliding scaleC/w zAvoid hyperglycemia to maximize wound healing Diabetes coordinator following.  Highly appreciated. Overnight blood sugar dropped in the 60s D5 W half-normal saline at 50 cc/h started on 03/21/2018 due to n.p.o. prior to surgery to avoid recurrent hypoglycemia  Paroxysmal A. fib Rate controlled on beta-blockers Hold Eliquis due to surgery planned tomorrow Continue heparin drip which is being managed by pharmacy. Hold heparin drip at 9 AM tomorrow 03/21/2018 in anticipation of surgery which is scheduled at 5 PM.  Morbid obesity BMI 46 Weight loss outpatient PT to assess  OSA C/w CPAP at night  AKI on CKD 3 improving Baseline creatinine 1.6 with GFR of 49 Creatinine on presentation 1.91 with GFR  of 40 Continue to avoid nephrotoxic agents/dehydration/hypotension Creatinine level trending down from 1.91 to 1.86 to 1.63 which is  baseline C/w Monitor urine output Repeat BMP in the morning  Alcohol abuse Continue CIWA protocol Continue multivitamins including folic acid and thiamine  Chronic normocytic anemia No sign of overt bleeding Hemoglobin 9.4 with baseline hemoglobin of 11 MCV 88 Repeat CBC in the morning  Chronic diastolic CHF Last 2D echo done on 12/14/2014 revealed preserved LVEF 55%, regional wall motion abnormalities could not be excluded. Strict I's and O's Daily weight  Code Status: Full code  Family Communication: None at bedside  Disposition Plan: Discharge to home in 2-3 days when clinically stable or when orthopedic surgery signs off   Consultants:  Orthopedic surgery  Procedures:  Left transmetatarsal amputation planned on 03/21/2018  Antimicrobials:  IV vancomycin and IV cefepime  DVT prophylaxis: Heparin drip   Objective: Vitals:   03/22/18 1400 03/22/18 2045 03/22/18 2130 03/22/18 2156  BP: (!) 144/67 132/69    Pulse: 71 (!) 105 98 97  Resp: 18 19  20   Temp: 98.8 F (37.1 C) 98.9 F (37.2 C)    TempSrc: Oral Oral    SpO2: 100% 100%  98%  Weight:      Height:        Intake/Output Summary (Last 24 hours) at 03/22/2018 2224 Last data filed at 03/22/2018 1354 Gross per 24 hour  Intake 1261.67 ml  Output 475 ml  Net 786.67 ml   Filed Weights   03/17/18 1500  Weight: (!) 183.8 kg    Exam:  . General: 67 y.o. year-old male obese alert and oriented x 3. . Cardiovascular: RRR no rubs or gallops. No JVD or thyromegaly. Marland Kitchen Respiratory: Clear to auscultation with no wheezes or rales. Good inspiratory effort. . Abdomen: Soft nontender nondistended with normal bowel sounds x4 quadrants. . Musculoskeletal: 2+ pitting edema in lower extremities bilaterally worse on the left from toes up to knee. 2/4 pulses in all 4 extremities. . Skin: Open wound noted in plantar portion of left foot, missing toes. Marland Kitchen Psychiatry: Mood is appropriate for condition and setting   Data  Reviewed: CBC: Recent Labs  Lab 03/17/18 1006 03/18/18 0621 03/19/18 0611 03/20/18 0627 03/21/18 0527 03/22/18 0542  WBC 8.5 7.7 7.0 7.3 8.0 6.7  NEUTROABS 5.9  --   --   --   --   --   HGB 11.4* 9.4* 9.8* 9.7* 9.5* 8.1*  HCT 35.0* 29.6* 30.4* 30.6* 30.0* 25.2*  MCV 87.9 88.4 87.9 88.4 87.5 89.0  PLT 286 237 258 273 295 951   Basic Metabolic Panel: Recent Labs  Lab 03/18/18 0621 03/19/18 0611 03/20/18 0627 03/21/18 0527 03/22/18 0542  NA 138 138 140 139 138  K 4.6 4.9 4.5 4.0 4.9  CL 104 106 107 108 109  CO2 25 23 23 25 24   GLUCOSE 173* 195* 101* 57* 238*  BUN 26* 23 22 20 20   CREATININE 1.86* 1.63* 1.47* 1.41*  1.41* 1.66*  CALCIUM 8.4* 8.6* 8.7* 8.5* 8.0*  MG  --  2.1  --   --   --    GFR: Estimated Creatinine Clearance: 77.3 mL/min (A) (by C-G formula based on SCr of 1.66 mg/dL (H)). Liver Function Tests: No results for input(s): AST, ALT, ALKPHOS, BILITOT, PROT, ALBUMIN in the last 168 hours. No results for input(s): LIPASE, AMYLASE in the last 168 hours. No results for input(s): AMMONIA in the last 168 hours. Coagulation Profile: No results for  input(s): INR, PROTIME in the last 168 hours. Cardiac Enzymes: No results for input(s): CKTOTAL, CKMB, CKMBINDEX, TROPONINI in the last 168 hours. BNP (last 3 results) No results for input(s): PROBNP in the last 8760 hours. HbA1C: No results for input(s): HGBA1C in the last 72 hours. CBG: Recent Labs  Lab 03/22/18 0456 03/22/18 0801 03/22/18 1216 03/22/18 1635 03/22/18 2048  GLUCAP 230* 161* 151* 107* 158*   Lipid Profile: No results for input(s): CHOL, HDL, LDLCALC, TRIG, CHOLHDL, LDLDIRECT in the last 72 hours. Thyroid Function Tests: No results for input(s): TSH, T4TOTAL, FREET4, T3FREE, THYROIDAB in the last 72 hours. Anemia Panel: No results for input(s): VITAMINB12, FOLATE, FERRITIN, TIBC, IRON, RETICCTPCT in the last 72 hours. Urine analysis:    Component Value Date/Time   COLORURINE YELLOW  05/17/2015 1025   APPEARANCEUR CLEAR 05/17/2015 1025   LABSPEC >=1.030 (A) 05/17/2015 1025   PHURINE 5.5 05/17/2015 1025   GLUCOSEU NEGATIVE 05/17/2015 1025   HGBUR MODERATE (A) 05/17/2015 1025   BILIRUBINUR NEGATIVE 05/17/2015 1025   KETONESUR NEGATIVE 05/17/2015 1025   PROTEINUR NEGATIVE 12/13/2014 2120   UROBILINOGEN 0.2 05/17/2015 1025   NITRITE NEGATIVE 05/17/2015 1025   LEUKOCYTESUR NEGATIVE 05/17/2015 1025   Sepsis Labs: @LABRCNTIP (procalcitonin:4,lacticidven:4)  ) Recent Results (from the past 240 hour(s))  MRSA PCR Screening     Status: None   Collection Time: 03/18/18 11:13 AM  Result Value Ref Range Status   MRSA by PCR NEGATIVE NEGATIVE Final    Comment:        The GeneXpert MRSA Assay (FDA approved for NASAL specimens only), is one component of a comprehensive MRSA colonization surveillance program. It is not intended to diagnose MRSA infection nor to guide or monitor treatment for MRSA infections. Performed at [facility], [place] [address] [place], [place] [address]   Culture, blood (routine x 2)     Status: None (Preliminary result)   Collection Time: 03/18/18  3:16 PM  Result Value Ref Range Status   Specimen Description   Final    BLOOD LEFT ANTECUBITAL Performed at [facility], [place] [address], [place], [place] [address]    Special Requests   Final    BOTTLES DRAWN AEROBIC AND ANAEROBIC Blood Culture adequate volume Performed at [place] [address] [place], [place] [address]    Culture   Final    NO GROWTH 4 DAYS Performed at [facility], [place] [address] [place], [place] [address]    Report Status PENDING  Incomplete  Culture, blood (routine x 2)     Status: None (Preliminary result)   Collection Time: 03/18/18  3:18 PM  Result Value Ref Range Status   Specimen Description   Final    BLOOD RIGHT FOREARM Performed at [facility] [address] [place], [place]  [address]    Special Requests   Final    BOTTLES DRAWN AEROBIC AND ANAEROBIC Blood Culture adequate volume Performed at [facility] [address] [place], [place] [address]    Culture   Final    NO GROWTH 4 DAYS Performed at [facility], [place] [address], [place], [place] [address]    Report Status PENDING  Incomplete      Studies: No results found.  Scheduled Meds: . amLODipine  5 mg Oral Daily  . apixaban  5 mg Oral BID  . atorvastatin  20 mg Oral q1800  . brinzolamide  1 drop Both  Eyes BID  . cholecalciferol  2,000 Units Oral Daily  . fentaNYL (SUBLIMAZE) injection  50-100 mcg Intravenous UD  . folic acid  1 mg Oral Daily  . gabapentin  300 mg Oral QHS  . insulin aspart  0-20 Units Subcutaneous TID WC  . insulin aspart  0-5 Units Subcutaneous QHS  . insulin aspart  12 Units Subcutaneous TID WC  . insulin glargine  45 Units Subcutaneous BID  . latanoprost  1 drop Both Eyes QHS  . metoprolol succinate  25 mg Oral q1800  . midazolam  1-2 mg Intravenous UD  . multivitamin with minerals  1 tablet Oral Daily  . polyethylene glycol  17 g Oral BID  . senna-docusate  2 tablet Oral BID  . sodium chloride flush  3 mL Intravenous Q12H  . thiamine  100 mg Oral Daily   Or  . thiamine  100 mg Intravenous Daily  . V-GO 20  1 application Continuous infusion (non-IV) Daily    Continuous Infusions: . sodium chloride    . ceFEPime (MAXIPIME) IV 2 g (03/22/18 2010)  . metronidazole 500 mg (03/22/18 1737)  . vancomycin 2,000 mg (03/22/18 1415)     LOS: 5 days     Kayleen Memos, MD [facility] Pager [phone]  If 7PM-7AM, please contact night-coverage [email or web address] Password [facility] 03/22/2018, 10:24 PM

## 2018-03-22 NOTE — Progress Notes (Signed)
Patient suffers from obesity, L transmetatarsal amputation which impairs their ability to perform daily activities like walking in the home.  A walker alone will not resolve the issues with performing activities of daily living. A wheelchair will allow patient to safely perform daily activities.  The patient can self propel in the home or has a caregiver who can provide assistance.     Blondell Reveal [place] PT 03/22/2018  [phone]

## 2018-03-23 LAB — CULTURE, BLOOD (ROUTINE X 2)
CULTURE: NO GROWTH
CULTURE: NO GROWTH
SPECIAL REQUESTS: ADEQUATE
Special Requests: ADEQUATE

## 2018-03-23 LAB — BASIC METABOLIC PANEL
Anion gap: 5 (ref 5–15)
BUN: 20 mg/dL (ref 8–23)
CHLORIDE: 110 mmol/L (ref 98–111)
CO2: 24 mmol/L (ref 22–32)
Calcium: 8.2 mg/dL — ABNORMAL LOW (ref 8.9–10.3)
Creatinine, Ser: 1.61 mg/dL — ABNORMAL HIGH (ref 0.61–1.24)
GFR calc non Af Amer: 42 mL/min — ABNORMAL LOW (ref >60)
GFR, EST AFRICAN AMERICAN: 49 mL/min — AB (ref >60)
Glucose, Bld: 90 mg/dL (ref 70–99)
Potassium: 4.4 mmol/L (ref 3.5–5.1)
SODIUM: 139 mmol/L (ref 135–145)

## 2018-03-23 LAB — GLUCOSE, CAPILLARY
GLUCOSE-CAPILLARY: 171 mg/dL — AB (ref 70–99)
Glucose-Capillary: 65 mg/dL — ABNORMAL LOW (ref 70–99)
Glucose-Capillary: 168 mg/dL — ABNORMAL HIGH (ref 70–99)
Glucose-Capillary: 137 mg/dL — ABNORMAL HIGH (ref 70–99)
Glucose-Capillary: 188 mg/dL — ABNORMAL HIGH (ref 70–99)

## 2018-03-23 LAB — CBC
HCT: 24.7 % — ABNORMAL LOW (ref 39.0–52.0)
HEMATOCRIT: 24.4 % — AB (ref 39.0–52.0)
HEMOGLOBIN: 7.8 g/dL — AB (ref 13.0–17.0)
Hemoglobin: 7.8 g/dL — ABNORMAL LOW (ref 13.0–17.0)
MCH: 28 pg (ref 26.0–34.0)
MCH: 28.3 pg (ref 26.0–34.0)
MCHC: 31.6 g/dL (ref 30.0–36.0)
MCHC: 32 g/dL (ref 30.0–36.0)
MCV: 88.4 fL (ref 78.0–100.0)
MCV: 88.5 fL (ref 78.0–100.0)
Platelets: 208 10*3/uL (ref 150–400)
Platelets: 215 10*3/uL (ref 150–400)
RBC: 2.79 MIL/uL — AB (ref 4.22–5.81)
RBC: 2.76 MIL/uL — ABNORMAL LOW (ref 4.22–5.81)
RDW: 15.7 % — ABNORMAL HIGH (ref 11.5–15.5)
RDW: 15.8 % — AB (ref 11.5–15.5)
WBC: 7.8 10*3/uL (ref 4.0–10.5)
WBC: 7.8 10*3/uL (ref 4.0–10.5)

## 2018-03-23 LAB — CREATININE, SERUM
Creatinine, Ser: 1.62 mg/dL — ABNORMAL HIGH (ref 0.61–1.24)
GFR calc Af Amer: 49 mL/min — ABNORMAL LOW (ref >60)
GFR calc non Af Amer: 42 mL/min — ABNORMAL LOW (ref >60)

## 2018-03-23 LAB — MAGNESIUM: MAGNESIUM: 2.1 mg/dL (ref 1.7–2.4)

## 2018-03-23 MED ORDER — FOLIC ACID 1 MG PO TABS: 1.0000 mg | ORAL_TABLET | Freq: Every day | ORAL | 0 refills | Status: DC

## 2018-03-23 MED ORDER — APIXABAN 5 MG PO TABS: 5.0000 mg | ORAL_TABLET | Freq: Two times a day (BID) | ORAL | 0 refills | Status: DC

## 2018-03-23 MED ORDER — AMLODIPINE BESYLATE 10 MG PO TABS
10.0000 mg | ORAL_TABLET | Freq: Every day | ORAL | Status: DC
  Administered 2018-03-24: 10 mg via ORAL
  Filled 2018-03-23: qty 1

## 2018-03-23 MED ORDER — HYDROCODONE-ACETAMINOPHEN 7.5-325 MG PO TABS: 1.0000 | ORAL_TABLET | Freq: Three times a day (TID) | ORAL | 0 refills | Status: AC | PRN

## 2018-03-23 MED ORDER — ADULT MULTIVITAMIN W/MINERALS CH: 1.0000 | ORAL_TABLET | Freq: Every day | ORAL | 0 refills | Status: DC

## 2018-03-23 MED ORDER — AMLODIPINE BESYLATE 5 MG PO TABS: 5.0000 mg | ORAL_TABLET | Freq: Every day | ORAL | 0 refills | Status: DC

## 2018-03-23 MED ORDER — THIAMINE HCL 100 MG PO TABS: 100.0000 mg | ORAL_TABLET | Freq: Every day | ORAL | 0 refills | Status: DC

## 2018-03-23 MED ORDER — ATORVASTATIN CALCIUM 20 MG PO TABS: 20.0000 mg | ORAL_TABLET | Freq: Every day | ORAL | 0 refills | Status: DC

## 2018-03-23 MED ORDER — VITAMIN D3 25 MCG (1000 UNIT) PO TABS: 2000.0000 [IU] | ORAL_TABLET | Freq: Every day | ORAL | 0 refills | Status: DC

## 2018-03-23 MED ORDER — DEXTROSE-NACL 5-0.45 % IV SOLN
INTRAVENOUS | Status: DC
  Administered 2018-03-23: 13:00:00 via INTRAVENOUS

## 2018-03-23 MED ORDER — GABAPENTIN 300 MG PO CAPS
300.0000 mg | ORAL_CAPSULE | Freq: Three times a day (TID) | ORAL | Status: DC
  Administered 2018-03-23 – 2018-03-24 (×4): 300 mg via ORAL
  Filled 2018-03-23 (×4): qty 1

## 2018-03-23 MED ORDER — CEPHALEXIN 500 MG PO CAPS: 500.0000 mg | ORAL_CAPSULE | Freq: Two times a day (BID) | ORAL | 0 refills | Status: DC

## 2018-03-23 MED ORDER — METOPROLOL SUCCINATE ER 25 MG PO TB24: 25.0000 mg | ORAL_TABLET | Freq: Every day | ORAL | 0 refills | Status: DC

## 2018-03-23 MED ORDER — POLYETHYLENE GLYCOL 3350 17 G PO PACK: 17.0000 g | PACK | Freq: Every day | ORAL | 0 refills | Status: DC

## 2018-03-23 MED ORDER — CEPHALEXIN 500 MG PO CAPS
500.0000 mg | ORAL_CAPSULE | Freq: Two times a day (BID) | ORAL | Status: DC
  Administered 2018-03-23 – 2018-03-24 (×3): 500 mg via ORAL
  Filled 2018-03-23 (×3): qty 1

## 2018-03-23 MED ORDER — INSULIN GLARGINE 100 UNIT/ML ~~LOC~~ SOLN
25.0000 [IU] | Freq: Two times a day (BID) | SUBCUTANEOUS | Status: DC
  Administered 2018-03-23 – 2018-03-24 (×3): 25 [IU] via SUBCUTANEOUS
  Filled 2018-03-23 (×4): qty 0.25

## 2018-03-23 MED ORDER — INSULIN LISPRO 100 UNIT/ML ~~LOC~~ SOLN: 10.0000 [IU] | Freq: Three times a day (TID) | SUBCUTANEOUS | 0 refills | Status: DC

## 2018-03-23 NOTE — Discharge Instructions (Signed)
Cellulitis, Adult Cellulitis is a skin infection. The infected area is usually red and sore. This condition occurs most often in the arms and lower legs. It is very important to get treated for this condition. Follow these instructions at home:  Take over-the-counter and prescription medicines only as told by your doctor.  If you were prescribed an antibiotic medicine, take it as told by your doctor. Do not stop taking the antibiotic even if you start to feel better.  Drink enough fluid to keep your pee (urine) clear or pale yellow.  Do not touch or rub the infected area.  Raise (elevate) the infected area above the level of your heart while you are sitting or lying down.  Place warm or cold wet cloths (warm or cold compresses) on the infected area. Do this as told by your doctor.  Keep all follow-up visits as told by your doctor. This is important. These visits let your doctor make sure your infection is not getting worse. Contact a doctor if:  You have a fever.  Your symptoms do not get better after 1-2 days of treatment.  Your bone or joint under the infected area starts to hurt after the skin has healed.  Your infection comes back. This can happen in the same area or another area.  You have a swollen bump in the infected area.  You have new symptoms.  You feel ill and also have muscle aches and pains. Get help right away if:  Your symptoms get worse.  You feel very sleepy.  You throw up (vomit) or have watery poop (diarrhea) for a long time.  There are red streaks coming from the infected area.  Your red area gets larger.  Your red area turns darker. This information is not intended to replace advice given to you by your health care provider. Make sure you discuss any questions you have with your health care provider. Document Released: 12/16/2007 Document Revised: 12/05/2015 Document Reviewed: 05/08/2015 Elsevier Interactive Patient Education  2018 [employer].  Bone and Joint Infections, Adult Bone infections (osteomyelitis) and joint infections (septic arthritis) occur when bacteria or other germs get inside a bone or a joint. This can happen if you have an infection in another part of your body that spreads through your blood. Germs from your skin or from outside of your body can also cause this type of infection if you have a wound or a broken bone (fracture) that breaks the skin. Anyone can get a bone infection or joint infection. You may be more likely to get this type of infection if you have a condition, such as diabetes, that lowers your ability to fight infection or increases your chances of getting an infection. Bone and joint infections can cause damage, and they can spread to other areas of your body. They need to be treated quickly. What are the causes? Most bone and joint infections are caused by bacteria. They can also be caused by other germs, such as viruses and funguses. What increases the risk? This condition is more likely to develop in:  People who recently had surgery, especially bone or joint surgery.  People who have a long-term (chronic) disease, such as: ? HIV (human immunodeficiency virus). ? Diabetes. ? Rheumatoid arthritis. ? Sickle cell anemia.  Elderly people.  People who take medicines that block or weaken the bodys defense system (immune system).  People who have a condition that reduces their blood flow.  People who are on kidney dialysis.  People who have an artificial joint.  People who have had a joint or bone repaired with plates or screws (surgical hardware).  People who use or abuse IV drugs.  People who have had trauma, such as stepping on a nail.  What are the signs or symptoms? Symptoms vary depending on the type and location of your infection. Common symptoms of bone and joint infections include:  Fever and chills.  Redness and warmth.  Swelling.  Pain and stiffness.  Drainage  of fluid or pus near the infection.  Weight loss and fatigue.  Decreased ability to use a hand or foot.  How is this diagnosed? This condition may be diagnosed based on symptoms, medical history, a physical exam, and diagnostic tests. Tests can help to identify the cause of the infection. You may have various tests, such as:  A sample of tissue, fluid, or blood taken to be examined under a microscope.  A procedure to remove fluid from the infected joint with a needle (joint aspiration) for testing in a lab.  Pus or discharge swabbed from a wound for testing to identify germs and to determine what type of medicine will kill them (culture and sensitivity).  Blood tests to look for evidence of infection and inflammation (biomarkers).  Imaging studies to determine how severe the bone or joint infection is. These may include: ? X-rays. ? CT scan. ? MRI. ? Bone scan.  How is this treated? Treatment depends on the cause and type of infection. Antibiotic medicines are usually the first treatment for a bone or joint infection. Treatment with antibiotics may include:  Getting IV antibiotics. This may be done in a hospital at first. You may have to continue IV antibiotics at home for several weeks. You may also have to take antibiotics by mouth for several weeks after that.  Taking more than one kind of antibiotic. Treatment may start with a type of antibiotic that works against many different bacteria (broad spectrumantibiotics). IV antibiotics may be changed if tests show that another type may work better.  Other treatments may include:  Draining fluid from the joint by placing a needle into it (aspiration).  Surgery to remove: ? Dead or dying tissue from a bone or joint. ? An infected artificial joint. ? Infected plates or screws that were used to repair a broken bone.  Follow these instructions at home:  Take medicines only as directed by your health care provider.  Take your  antibiotic medicine as directed by your health care provider. Finish the antibiotic even if you start to feel better.  Follow instructions from your health care provider about how to take IV antibiotics at home.  Ask your health care provider if you have any restrictions on your activities.  Keep all follow-up visits as directed by your health care provider. This is important. Contact a health care provider if:  You have a fever or chills.  You have redness, warmth, pain, or swelling that returns after treatment. Get help right away if:  You have rapid breathing or you have trouble breathing.  You have chest pain.  You cannot drink fluids or make urine.  The affected arm or leg swells, changes color, or turns blue. This information is not intended to replace advice given to you by your health care provider. Make sure you discuss any questions you have with your health care provider. Document Released: 06/29/2005 Document Revised: 12/05/2015 Document Reviewed: 06/27/2014 Elsevier Interactive Patient Education  [employer].  Diabetes and Foot Care Diabetes may cause you to have problems because of poor blood supply (circulation) to your feet and legs. This may cause the skin on your feet to become thinner, break easier, and heal more slowly. Your skin may become dry, and the skin may peel and crack. You may also have nerve damage in your legs and feet causing decreased feeling in them. You may not notice minor injuries to your feet that could lead to infections or more serious problems. Taking care of your feet is one of the most important things you can do for yourself. Follow these instructions at home:  Wear shoes at all times, even in the house. Do not go barefoot. Bare feet are easily injured.  Check your feet daily for blisters, cuts, and redness. If you cannot see the bottom of your feet, use a mirror or ask someone for help.  Wash your feet with warm water (do not use  hot water) and mild soap. Then pat your feet and the areas between your toes until they are completely dry. Do not soak your feet as this can dry your skin.  Apply a moisturizing lotion or petroleum jelly (that does not contain alcohol and is unscented) to the skin on your feet and to dry, brittle toenails. Do not apply lotion between your toes.  Trim your toenails straight across. Do not dig under them or around the cuticle. File the edges of your nails with an emery board or nail file.  Do not cut corns or calluses or try to remove them with medicine.  Wear clean socks or stockings every day. Make sure they are not too tight. Do not wear knee-high stockings since they may decrease blood flow to your legs.  Wear shoes that fit properly and have enough cushioning. To break in new shoes, wear them for just a few hours a day. This prevents you from injuring your feet. Always look in your shoes before you put them on to be sure there are no objects inside.  Do not cross your legs. This may decrease the blood flow to your feet.  If you find a minor scrape, cut, or break in the skin on your feet, keep it and the skin around it clean and dry. These areas may be cleansed with mild soap and water. Do not cleanse the area with peroxide, alcohol, or iodine.  When you remove an adhesive bandage, be sure not to damage the skin around it.  If you have a wound, look at it several times a day to make sure it is healing.  Do not use heating pads or hot water bottles. They may burn your skin. If you have lost feeling in your feet or legs, you may not know it is happening until it is too late.  Make sure your health care provider performs a complete foot exam at least annually or more often if you have foot problems. Report any cuts, sores, or bruises to your health care provider immediately. Contact a health care provider if:  You have an injury that is not healing.  You have cuts or breaks in the  skin.  You have an ingrown nail.  You notice redness on your legs or feet.  You feel burning or tingling in your legs or feet.  You have pain or cramps in your legs and feet.  Your legs or feet are numb.  Your feet always feel cold. Get help right away if:  There is  increasing redness, swelling, or pain in or around a wound.  There is a red line that goes up your leg.  Pus is coming from a wound.  You develop a fever or as directed by your health care provider.  You notice a bad smell coming from an ulcer or wound. This information is not intended to replace advice given to you by your health care provider. Make sure you discuss any questions you have with your health care provider. Document Released: 06/26/2000 Document Revised: 12/05/2015 Document Reviewed: 12/06/2012 Elsevier Interactive Patient Education  2017 [employer].   Type 2 Diabetes Mellitus, Self Care, Adult When you have type 2 diabetes (type 2 diabetes mellitus), you must keep your blood sugar (glucose) under control. You can do this with:  Nutrition.  Exercise.  Lifestyle changes.  Medicines or insulin, if needed.  Support from your doctors and others.  How do I manage my blood sugar?  Check your blood sugar level every day, as often as told.  Call your doctor if your blood sugar is above your goal numbers for 2 tests in a row.  Have your A1c (hemoglobin A1c) level checked at least two times a year. Have it checked more often if your doctor tells you to. Your doctor will set treatment goals for you. Generally, you should have these blood sugar levels:  Before meals (preprandial): 80-130 mg/dL (4.4-7.2 mmol/L).  After meals (postprandial): lower than 180 mg/dL (10 mmol/L).  A1c level: less than 7%.  What do I need to know about high blood sugar? High blood sugar is called hyperglycemia. Know the signs of high blood sugar. Signs may include:  Feeling: ? Thirsty. ? Hungry. ? Very  tired.  Needing to pee (urinate) more than usual.  Blurry vision.  What do I need to know about low blood sugar? Low blood sugar is called hypoglycemia. This is when blood sugar is at or below 70 mg/dL (3.9 mmol/L). Symptoms may include:  Feeling: ? Hungry. ? Worried or nervous (anxious). ? Sweaty and clammy. ? Confused. ? Dizzy. ? Sleepy. ? Sick to your stomach (nauseous).  Having: ? A fast heartbeat (palpitations). ? A headache. ? A change in your vision. ? Jerky movements that you cannot control (seizure). ? Nightmares. ? Tingling or no feeling (numbness) around the mouth, lips, or tongue.  Having trouble with: ? Talking. ? Paying attention (concentrating). ? Moving (coordination). ? Sleeping.  Shaking.  Passing out (fainting).  Getting upset easily (irritability).  Treating low blood sugar  To treat low blood sugar, eat or drink something sugary right away. If you can think clearly and swallow safely, follow the 15:15 rule:  Take 15 grams of a fast-acting carb (carbohydrate). Some fast-acting carbs are: ? 1 tube of glucose gel. ? 3 sugar tablets (glucose pills). ? 6-8 pieces of hard candy. ? 4 oz (120 mL) of fruit juice. ? 4 oz (120 mL) regular (not diet) soda.  Check your blood sugar 15 minutes after you take the carb.  If your blood sugar is still at or below 70 mg/dL (3.9 mmol/L), take 15 grams of a carb again.  If your blood sugar does not go above 70 mg/dL (3.9 mmol/L) after 3 tries, get help right away.  After your blood sugar goes back to normal, eat a meal or a snack within 1 hour.  Treating very low blood sugar If your blood sugar is at or below 54 mg/dL (3 mmol/L), you have very low blood sugar (severe  hypoglycemia). This is an emergency. Do not wait to see if the symptoms will go away. Get medical help right away. Call your local emergency services (911 in the [place] Do not drive yourself to the hospital. If you have very low blood sugar and  you cannot eat or drink, you may need a glucagon shot (injection). A family member or friend should learn how to check your blood sugar and how to give you a glucagon shot. Ask your doctor if you need to have a glucagon shot kit at home. What else is important to manage my diabetes? Medicine Follow these instructions about insulin and diabetes medicines:  Take them as told by your doctor.  Adjust them as told by your doctor.  Do not run out of them.  Having diabetes can raise your risk for other long-term conditions. These include heart or kidney disease. Your doctor may prescribe medicines to help prevent problems from diabetes. Food   Make healthy food choices. These include: ? Chicken, fish, egg whites, and beans. ? Oats, whole wheat, bulgur, brown rice, quinoa, and millet. ? Fresh fruits and vegetables. ? Low-fat dairy products. ? Nuts, avocado, olive oil, and canola oil.  Make a food plan with a specialist (dietitian).  Follow instructions from your doctor about what you cannot eat or drink.  Drink enough fluid to keep your pee (urine) clear or pale yellow.  Eat healthy snacks between healthy meals.  Keep track of carbs that you eat. Read food labels. Learn food serving sizes.  Follow your sick day plan when you cannot eat or drink normally. Make this plan with your doctor so it is ready to use. Activity  Exercise at least 3 times a week.  Do not go more than 2 days without exercising.  Talk with your doctor before you start a new exercise. Your doctor may need to adjust your insulin, medicines, or food. Lifestyle   Do not use any tobacco products. These include cigarettes, chewing tobacco, and e-cigarettes.If you need help quitting, ask your doctor.  Ask your doctor how much alcohol is safe for you.  Learn to deal with stress. If you need help with this, ask your doctor. Body care  Stay up to date with your shots (immunizations).  Have your eyes and feet  checked by a doctor as often as told.  Check your skin and feet every day. Check for cuts, bruises, redness, blisters, or sores.  Brush your teeth and gums two times a day.  Floss at least one time a day.  Go to the dentist least one time every 6 months.  Stay at a healthy weight. General instructions   Take over-the-counter and prescription medicines only as told by your doctor.  Share your diabetes care plan with: ? Your work or school. ? People you live with.  Check your pee (urine) for ketones: ? When you are sick. ? As told by your doctor.  Carry a card or wear jewelry that says that you have diabetes.  Ask your doctor: ? Do I need to meet with a diabetes educator? ? Where can I find a support group for people with diabetes?  Keep all follow-up visits as told by your doctor. This is important. Where to find more information: To learn more about diabetes, visit:  American Diabetes Association: www.diabetes.org  American Association of Diabetes Educators: www.diabeteseducator.org/patient-resources  This information is not intended to replace advice given to you by your health care provider. Make sure you discuss any  questions you have with your health care provider. Document Released: 10/21/2015 Document Revised: 12/05/2015 Document Reviewed: 08/02/2015 Elsevier Interactive Patient Education  [employer].

## 2018-03-23 NOTE — Discharge Summary (Signed)
Discharge Summary  [name] ZLD:[id number] DOB: [birth year]  PCP: Aldine Contes, MD  Admit date: 03/17/2018 Discharge date: 03/23/2018  Time spent: 25 minutes  Recommendations for Outpatient Follow-up:  1. Follow-up with orthopedic surgery 2. Follow-up with PCP 3. Take medications as prescribed 4. Continue physical therapy 5. Fall precautions  Discharge Diagnoses:  Active Hospital Problems   Diagnosis Date Noted  . Cellulitis and abscess of foot 03/17/2018  . Foot ulcer, left ([place]) 12/13/2014  . Paroxysmal atrial fibrillation ([place]) 12/13/2014  . Hypertension 11/18/2011  . Obstructive sleep apnea 12/12/2009  . Diabetes type 2, uncontrolled ([place]) 12/28/2007  . Diabetic peripheral neuropathy associated with type 2 diabetes mellitus ([place]) 12/16/2006  . Chronic kidney disease, stage III (moderate) ([place]) 12/01/2006  . Morbid obesity ([place]) 08/23/2006    Resolved Hospital Problems  No resolved problems to display.    Discharge Condition: Stable  Diet recommendation: Heart healthy diabetic diet  Vitals:   03/23/18 0503 03/23/18 0800  BP: 135/74   Pulse: 68   Resp: 19   Temp: (!) 97.5 F (36.4 C) 98.2 F (36.8 C)  SpO2: 100%     History of present illness:  [name], [name], GERD, [name], HTN, morbid obesity, [name], progressively worsening left foot wound and left lower extremity edema. Bonesample on 8/29/19sent for culture which came back positive for Proteus mirabilis, Pseudomonas aeruginosa, and MRSA. Was previously onLevaquin and doxycycline for 2 days. MRI confirmed left foot osteomyelitis on 03/18/18. Continued on IV cefepime, IV flagyl, and IV vancomycin.  IV vancomycin discontinued due to negative MRSA screening on 03/18/2018.  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  Hospital course complicated by episodes of  hypoglycemia with blood sugar as low as 60s which were corrected.  Suspect secondary to poor oral intake with high dose maintenance insulin.  Last hemoglobin A1c July 2019 was 8.0.  03/23/2018: [name]  Reports intermittent shooting pain in his left foot.  Increased frequency of gabapentin from 300 mg nightly to 300 mg 3 times daily.  Patient denies chest pain, dyspnea, palpitations, nausea, abdominal cramping.  On the day of discharge, the patient was hemodynamically stable.  He will need to follow-up with [place] primary care provider, orthopedic surgery, post hospitalization.   Hospital Course:  Principal Problem:   Cellulitis and abscess of foot Active Problems:   Diabetes type 2, uncontrolled ([place])   Obstructive sleep apnea   Diabetic peripheral neuropathy associated with type 2 diabetes mellitus (HCC)   Chronic kidney disease, stage III (moderate) (HCC)   Morbid obesity ([place])   Hypertension   Foot ulcer, left (HCC)   Paroxysmal atrial fibrillation (HCC)  POD#2 post left metatarsal amputation with bone culture positive for protein mirabilis, Pseudomonas aeruginosa MRI done at [facility] on 03/18/2018 confirmed osteomyelitis affecting the fourth metatarsal head and fourth toe complicated by abscesses in the second and third metatarsal region and cellulitis blood cultures x2 peripherally done on 03/18/2018 no growth to date Completed 7 days of IV antibiotics which included IV cefepime, IV Flagyl, and IV vancomycin Stopped IV vancomycin on 03/18/2018 Left transmetatarsal amputation done on 03/21/2018 by orthopedic surgery  Uncontrolled type 2 diabetes with hyperglycemia and transient hypoglycemia Last A1c from July 2019 revealed hemoglobin A1c of 8.0 Continue antidiabetic regimen Follow-up with your primary care provider outpatient  Paroxysmal A. fib Rate controlled on beta-blocker Resume Eliquis as  recommended by orthopedic surgery  Morbid  obesity BMI 46 Weight loss outpatient with healthy dieting and regular physical activity  OSA, he is compliant with his CPAP C/w CPAP at night  AKI on CKD 3 improving Back to his baseline creatinine 1.6 with GFR of 49 Creatinine on presentation 1.91 with GFR of 40 Continue to avoid nephrotoxic agents/dehydration/hypotension Follow-up with your primary care provider  Alcohol abuse Continue CIWA protocol Continue multivitamins including folic acid and thiamine  Acute blood loss anemia in the setting of chronic normocytic anemia No sign of overt bleeding Hemoglobin 9.4 with baseline hemoglobin of 11 MCV 88 Hemoglobin 7.8, baseline 9 Follow-up with your PCP posthospitalization  Chronic diastolic CHF Last 2D echo done on 12/14/2014 revealed preserved LVEF 55%, regional wall motion abnormalities could not be excluded. Strict I's and O's Daily weight  Code Status: Full code  Family Communication:  Wife at bedside.  All questions answered to her satisfaction.    Consultants:  Orthopedic surgery  Procedures:  Left transmetatarsal amputation planned on 03/21/2018  Antimicrobials: Completed IV cefepime, IV Flagyl and IV vancomycin   DVT prophylaxis:  Eliquis   Discharge Exam: BP 135/74 (BP Location: Right Arm)   Pulse 68   Temp 98.2 F (36.8 C)   Resp 19   Ht _0  (1.981 m)   Wt (!) 183.8 kg   SpO2 100%   BMI 46.83 kg/m  . General: 67 y.o. year-old malee well developed well nourished in no acute distress.  Alert and oriented x3. . Cardiovascular: Regular rate and rhythm with no rubs or gallops.  No thyromegaly or JVD noted.   Marland Kitchen Respiratory: Clear to auscultation with no wheezes or rales. Good inspiratory effort. . Abdomen: Soft nontender nondistended with normal bowel sounds x4 quadrants. . Musculoskeletal: Left lower extremity is in a surgical dressing. Marland Kitchen Psychiatry: Mood is appropriate for condition and setting  Discharge Instructions You were cared  for by a hospitalist during your hospital stay. If you have any questions about your discharge medications or the care you received while you were in the hospital after you are discharged, you can call the unit and asked to speak with the hospitalist on call if the hospitalist that took care of you is not available. Once you are discharged, your primary care physician will handle any further medical issues. Please note that NO REFILLS for any discharge medications will be authorized once you are discharged, as it is imperative that you return to your primary care physician (or establish a relationship with a primary care physician if you do not have one) for your aftercare needs so that they can reassess your need for medications and monitor your lab values.   Allergies as of 03/23/2018      Reactions   Vancomycin    REACTION: ARF      Medication List    STOP taking these medications   doxycycline 100 MG capsule Commonly known as:  MONODOX   enalapril 20 MG tablet Commonly known as:  VASOTEC   furosemide 40 MG tablet Commonly known as:  LASIX   gabapentin 300 MG capsule Commonly known as:  NEURONTIN   HUMULIN R 500 UNIT/ML injection Generic drug:  insulin regular human CONCENTRATED   HUMULIN R U-500 KWIKPEN 500 UNIT/ML kwikpen Generic drug:  insulin regular human CONCENTRATED   levofloxacin 500 MG tablet Commonly known as:  LEVAQUIN   Vitamin D3 2000 units capsule Replaced by:  cholecalciferol 1000 units tablet     TAKE  these medications   ACCU-CHEK AVIVA PLUS test strip Generic drug:  glucose blood USE TO CHECK BLOOD FIVE TIMES DAILY   ACCU-CHEK AVIVA PLUS w/Device Kit CHECK BLOOD SUGAR 5 TIMES A DAY   ACCU-CHEK FASTCLIX LANCETS Misc Check blood sugar 5 times a day   albuterol 108 (90 Base) MCG/ACT inhaler Commonly known as:  PROVENTIL HFA;VENTOLIN HFA Inhale 2 puffs into the lungs every 6 (six) hours as needed for wheezing or shortness of breath.   amLODipine 5 MG  tablet Commonly known as:  NORVASC Take 1 tablet (5 mg total) by mouth daily. Start taking on:  03/24/2018   apixaban 5 MG Tabs tablet Commonly known as:  ELIQUIS Take 1 tablet (5 mg total) by mouth 2 (two) times daily. What changed:  See the new instructions.   atorvastatin 20 MG tablet Commonly known as:  LIPITOR Take 1 tablet (20 mg total) by mouth daily at 6 PM. What changed:    how much to take  how to take this  when to take this  additional instructions   AZOPT 1 % ophthalmic suspension Generic drug:  brinzolamide Place 1 drop into both eyes 2 (two) times daily.   becaplermin 0.01 % gel Commonly known as:  REGRANEX Apply 1 application topically daily.   cephALEXin 500 MG capsule Commonly known as:  KEFLEX Take 1 capsule (500 mg total) by mouth every 12 (twelve) hours.   cholecalciferol 1000 units tablet Commonly known as:  VITAMIN D Take 2 tablets (2,000 Units total) by mouth daily. Start taking on:  03/24/2018 Replaces:  Vitamin D3 2000 units capsule   diclofenac sodium 1 % Gel Commonly known as:  VOLTAREN Apply 4 g topically 4 (four) times daily.   docusate sodium 100 MG capsule Commonly known as:  COLACE Take 1 capsule (100 mg total) by mouth daily as needed for mild constipation.   folic acid 1 MG tablet Commonly known as:  FOLVITE Take 1 tablet (1 mg total) by mouth daily. Start taking on:  03/24/2018   FREESTYLE LIBRE 14 DAY SENSOR Misc 1 each by Does not apply route 6 (six) times daily.   HYDROcodone-acetaminophen 7.5-325 MG tablet Commonly known as:  NORCO Take 1 tablet by mouth every 8 (eight) hours as needed for moderate pain or severe pain. What changed:    when to take this  reasons to take this   insulin lispro 100 UNIT/ML injection Commonly known as:  HUMALOG Inject 0.1 mLs (10 Units total) into the skin 3 (three) times daily with meals.   latanoprost 0.005 % ophthalmic solution Commonly known as:  XALATAN Place 1 drop into  both eyes at bedtime.   metoprolol succinate 25 MG 24 hr tablet Commonly known as:  TOPROL-XL Take 1 tablet (25 mg total) by mouth daily at 6 PM. What changed:    how much to take  how to take this  when to take this  additional instructions   multivitamin with minerals Tabs tablet Take 1 tablet by mouth daily. Start taking on:  03/24/2018   polyethylene glycol packet Commonly known as:  MIRALAX / GLYCOLAX Take 17 g by mouth daily.   promethazine 25 MG tablet Commonly known as:  PHENERGAN TAKE 1 TABLET BY MOUTH  EVERY 8 HOURS AS NEEDED FOR NAUSEA OR VOMITING   silver sulfADIAZINE 1 % cream Commonly known as:  SILVADENE Apply 1 application topically daily.   SIMBRINZA 1-0.2 % Susp Generic drug:  Brinzolamide-Brimonidine Place 1 drop into both eyes  2 (two) times daily.   thiamine 100 MG tablet Take 1 tablet (100 mg total) by mouth daily. Start taking on:  03/24/2018   V-GO 20 Kit 1 application by Continuous infusion (non-IV) route daily. USE 1 PUMP DAILY WITH INSULIN   VICTOZA 18 MG/3ML Sopn Generic drug:  liraglutide INJECT 1.8 MG ONCE DAILY AT THE SAME TIME What changed:  See the new instructions.            Durable Medical Equipment  (From admission, onward)         Start     Ordered   03/23/18 0605  For home use only DME Walker rolling  Once    Question:  Patient needs a walker to treat with the following condition  Answer:  Ambulatory dysfunction   03/23/18 0604   03/23/18 0604  For home use only DME 3 n 1  Once     03/23/18 0604   03/23/18 0604  For home use only DME wheelchair cushion (seat and back)  Once     03/23/18 0604         Allergies  Allergen Reactions  . Vancomycin     REACTION: ARF   Follow-up Information    Erle Crocker, MD. Call in 2 weeks.   Specialty:  Orthopedic Surgery Why:  Please call for a two weekpost operative appointment Contact information: [place] [address] [phone]         Aldine Contes, MD. Call in 1 day(s).   Specialty:  Internal Medicine Why:  Please call for an appointment. Contact information: [place], [address] [place] [address] [phone]            The results of significant diagnostics from this hospitalization (including imaging, microbiology, ancillary and laboratory) are listed below for reference.    Significant Diagnostic Studies: Mr Foot Left Wo Contrast  Result Date: 03/18/2018 CLINICAL DATA:  History of amputation at the distal second and third metatarsals. Open draining wound at the amputation site. Question osteomyelitis. EXAM: MRI OF THE LEFT FOOT WITHOUT CONTRAST TECHNIQUE: Multiplanar, multisequence MR imaging of the left foot was performed. No intravenous contrast was administered. COMPARISON:  Plain films left foot 03/11/2018 and 11/23/2017. FINDINGS: Bones/Joint/Cartilage Postoperative change of amputation at the level of the distal second and third metatarsals is identified. Marrow edema in the distal 4.1 cm of the second metatarsal is most intense in the head and neck. Moderate edema is seen in almost the entire third metatarsal remnant extending 6.5 cm in length. Edema is also seen in the head of the fourth metatarsal and throughout the fourth toe. Mild increased T2 signal in the proximal phalanx of the fifth toe could be artifactual as it is at the edge of the field. Ligaments Intact. Muscles and Tendons Marked atrophy of intrinsic musculature the foot is seen. No intramuscular fluid collection. Soft tissues Subcutaneous edema is present in the dorsum of the foot. There is a focal fluid collection measuring 2.2 cm craniocaudal by 3.1 cm long by 0.6 cm transverse superficial to the distal aspect of the third metatarsal remnant. This collection extends to an open wound at the stump. A second fluid collection along the medial margin of the distal second metatarsal remnant measures 1.2 cm craniocaudal by 0.2 cm transverse  by 1 cm long. IMPRESSION: Marrow edema in the distal 4.1 cm of the second metatarsal and throughout almost the entire third metatarsal is consistent with osteomyelitis. Edema is also seen in  the fourth metatarsal head and fourth toe consistent with osteomyelitis. Mild increased T2 signal in the proximal phalanx of the fifth toe could be due to reactive change but may also be artifactual. Abscess at the distal aspect of the remnant of the third metatarsal extends to the skin surface. A second abscess is seen along the medial margin of the distal second metatarsal remnant. Diffuse subcutaneous edema about the foot consistent with cellulitis. Electronically Signed   By: Inge Rise M.D.   On: 03/18/2018 10:33   Dg Foot Complete Left  Result Date: 03/11/2018 CLINICAL DATA:  67 year old male with osteomyelitis of the left foot EXAM: LEFT FOOT - COMPLETE 3+ VIEW COMPARISON:  Prior radiographs 11/23/2017 FINDINGS: Surgical changes of prior left second and third digital amputation extending through the heads of the metatarsals. Compared to the prior imaging, there has been development of a small amount of heterotopic ossification adjacent to the second metatarsal amputation site. No evidence of new bony destruction or refraction to suggest a new site of osteomyelitis. Soft tissue swelling is present in the region of the amputation site suggesting cellulitis. IMPRESSION: 1. Soft tissue swelling around the forefoot in the region of the first toe and second and third toe amputation site concerning for cellulitis. 2. Developing heterotopic ossification adjacent to the second metatarsal head amputation site. 3. No definitive conventional radiographic findings of new osteomyelitis. Electronically Signed   By: Jacqulynn Cadet M.D.   On: 03/11/2018 13:54    Microbiology: Recent Results (from the past 240 hour(s))  MRSA PCR Screening     Status: None   Collection Time: 03/18/18 11:13 AM  Result Value Ref Range  Status   MRSA by PCR NEGATIVE NEGATIVE Final    Comment:        The GeneXpert MRSA Assay (FDA approved for NASAL specimens only), is one component of a comprehensive MRSA colonization surveillance program. It is not intended to diagnose MRSA infection nor to guide or monitor treatment for MRSA infections. Performed at [facility], [place] [address] [place], [place] [address]   Culture, blood (routine x 2)     Status: None   Collection Time: 03/18/18  3:16 PM  Result Value Ref Range Status   Specimen Description   Final    BLOOD LEFT ANTECUBITAL Performed at [facility], [place] [address] [place], [place] [address]    Special Requests   Final    BOTTLES DRAWN AEROBIC AND ANAEROBIC Blood Culture adequate volume Performed at [place] [address], [place], [place] [address]    Culture   Final    NO GROWTH 5 DAYS Performed at [facility], [place] [address] [place], [place] [address]    Report Status 03/23/2018 FINAL  Final  Culture, blood (routine x 2)     Status: None   Collection Time: 03/18/18  3:18 PM  Result Value Ref Range Status   Specimen Description   Final    BLOOD RIGHT FOREARM Performed at [place] [address] [place], [place] [address]    Special Requests   Final    BOTTLES DRAWN AEROBIC AND ANAEROBIC Blood Culture adequate volume Performed at [place] [address] [place], [place] [address]    Culture   Final    NO GROWTH 5 DAYS Performed at [facility], [place] [address] [place], [place] [address]    Report Status 03/23/2018 FINAL  Final  Labs: Basic Metabolic Panel: Recent Labs  Lab 03/19/18 0611 03/20/18 0627 03/21/18 0527 03/22/18 0542 03/23/18 0629  NA 138 140 139 138 139  K 4.9 4.5 4.0 4.9 4.4  CL 106 107 108 109 110  CO2 _0 GLUCOSE 195* 101* 57* 238* 90  BUN _1 CREATININE 1.63* 1.47* 1.41*  1.41*  1.66* 1.61*  1.62*  CALCIUM 8.6* 8.7* 8.5* 8.0* 8.2*  MG 2.1  --   --   --  2.1   Liver Function Tests: No results for input(s): AST, ALT, ALKPHOS, BILITOT, PROT, ALBUMIN in the last 168 hours. No results for input(s): LIPASE, AMYLASE in the last 168 hours. No results for input(s): AMMONIA in the last 168 hours. CBC: Recent Labs  Lab 03/17/18 1006  03/19/18 0611 03/20/18 0627 03/21/18 0527 03/22/18 0542 03/23/18 0629  WBC 8.5   < > 7.0 7.3 8.0 6.7 7.8  7.8  NEUTROABS 5.9  --   --   --   --   --   --   HGB 11.4*   < > 9.8* 9.7* 9.5* 8.1* 7.8*  7.8*  HCT 35.0*   < > 30.4* 30.6* 30.0* 25.2* 24.4*  24.7*  MCV 87.9   < > 87.9 88.4 87.5 89.0 88.4  88.5  PLT 286   < > 258 273 295 217 208  215   < > = values in this interval not displayed.   Cardiac Enzymes: No results for input(s): CKTOTAL, CKMB, CKMBINDEX, TROPONINI in the last 168 hours. BNP: BNP (last 3 results) No results for input(s): BNP in the last 8760 hours.  ProBNP (last 3 results) No results for input(s): PROBNP in the last 8760 hours.  CBG: Recent Labs  Lab 03/22/18 1635 03/22/18 2048 03/23/18 0816 03/23/18 1030 03/23/18 1121  GLUCAP 107* 158* 65* 168* 171*       Signed:  Kayleen Memos, MD [facility] 03/23/2018, 11:58 AM

## 2018-03-23 NOTE — Clinical Social Work Note (Signed)
Clinical Social Work Assessment  Patient Details  Name: [name] MRN: [id number] Date of Birth: 1951-05-26  Date of referral:  03/23/18               Reason for consult:  Facility Placement                Permission sought to share information with:  Chartered certified accountant granted to share information::  Yes, Verbal Permission Granted  Name::        Agency::     Relationship::     Contact Information:     Housing/Transportation Living arrangements for the past 2 months:  Single Family Home Source of Information:  Patient Patient Interpreter Needed:  None Criminal Activity/Legal Involvement Pertinent to Current Situation/Hospitalization:  No - Comment as needed Significant Relationships:  Spouse Lives with:  Spouse Do you feel safe going back to the place where you live?  (PT recommending SNF) Need for family participation in patient care:  No (Coment)  Care giving concerns:  Patient from home with wife. Patient reported that prior to hospitalization he was independent with ambulation and ADLs. Patient admitted with principal problem cellulitis and abscess of foot. PT recommending SNF.   Social Worker assessment / plan:  CSW spoke with patient at bedside regarding discharge planning and PT recommendation for SNF, patient accompanied by his wife. CSW explained SNF placement process and insurance authoirzation, patient verbalized understanding. Patient reported that he is agreeable to SNF for ST rehab because he is unable to care for himself in his current condition and reported that his wife is unable to assist him with care. CSW agreed to complete patient's FL2 and follow up with bed offers.  CSW completed patient's FL2 and follow up with bed offers.  CSW will continue to follow and assist with discharge planning.   Employment status:  Disabled (Comment on whether or not currently receiving Disability) Insurance information:  Managed Medicare PT  Recommendations:  [place] / Referral to community resources:  [place]  Patient/Family's Response to care:  Patient appreciative of CSW assistance with discharge planning.   Patient/Family's Understanding of and Emotional Response to Diagnosis, Current Treatment, and Prognosis:  Patient presented calm and appropriate. Patient verbalized understanding of current treatment plan and reported plan to dc to SNF for ST rehab before returning home.  Emotional Assessment Appearance:  Appears stated age Attitude/Demeanor/Rapport:  Other(Cooperative) Affect (typically observed):  Appropriate, Calm Orientation:  Oriented to Self, Oriented to Situation, Oriented to Place, Oriented to  Time Alcohol / Substance use:  Not Applicable Psych involvement (Current and /or in the community):  No (Comment)  Discharge Needs  Concerns to be addressed:  Care Coordination Readmission within the last 30 days:  No Current discharge risk:  Physical Impairment Barriers to Discharge:  Continued Medical Work up   [employer], LCSW 03/23/2018, 11:16 AM

## 2018-03-23 NOTE — Progress Notes (Signed)
BG = 65; RN assessed patient, patient is alert and oriented x4; offered orange juice and breakfast; will check BG in 15 minutes. Notified MD Hall at [phone].

## 2018-03-23 NOTE — Progress Notes (Signed)
CSW following to assist with discharge planning to SNF.  CSW followed up with [place] at [facility] to inquire about status patient's insurance authorization [facility] Medicare). Staff reported that patient's insurance authorization is still pending.   Patient needs [facility] Medicare insurance authorization to discharge to SNF.  CSW will continue to follow and assist with discharge planning.  Abundio Miu, [place] Social Worker [facility] Cell#: [phone]

## 2018-03-23 NOTE — Clinical Social Work Placement (Signed)
Patient received and accepted bed offer at [place] at [facility]. Facility notified and agreed to start [facility] insurance authorization. Patient's insurance authorization pending.   CLINICAL SOCIAL WORK PLACEMENT  NOTE  Date:  03/23/2018  Patient Details  Name: [name] MRN: [id number] Date of Birth: October 05, 1950  Clinical Social Work is seeking post-discharge placement for this patient at the [place] level of care (*CSW will initial, date and re-position this form in  chart as items are completed):  Yes   Patient/family provided with [place] Work Department's list of facilities offering this level of care within the geographic area requested by the patient (or if unable, by the patient's family).  Yes   Patient/family informed of their freedom to choose among providers that offer the needed level of care, that participate in Medicare, Medicaid or managed care program needed by the patient, have an available bed and are willing to accept the patient.  Yes   Patient/family informed of [facility]'s ownership interest in [facility] and [facility], as well as of the fact that they are under no obligation to receive care at these facilities.  PASRR submitted to EDS on 03/23/18     PASRR number received on 03/23/18     Existing PASRR number confirmed on       FL2 transmitted to all facilities in geographic area requested by pt/family on 03/23/18     FL2 transmitted to all facilities within larger geographic area on       Patient informed that his/her managed care company has contracts with or will negotiate with certain facilities, including the following:        Yes   Patient/family informed of bed offers received.  Patient chooses bed at [facility]     Physician recommends and patient chooses bed at      Patient to be transferred to   on  .  Patient to be transferred to facility by        Patient family notified on   of transfer.  Name of family member notified:        PHYSICIAN       Additional Comment:    _______________________________________________ Burnis Medin, LCSW 03/23/2018, 1:09 PM

## 2018-03-23 NOTE — Progress Notes (Signed)
Paged MD [place] Cr[place]. Hydralazine IV was given as MD ordered. RN will re- check BP in 30 minutes.

## 2018-03-23 NOTE — Anesthesia Postprocedure Evaluation (Signed)
Anesthesia Post Note  Patient: [name]  Procedure(s) Performed: TRANSMETATARSAL AMPUTATION LEFT FOOT (Left Foot) ACHILLES TENDON LENGTHENING (Left Ankle)     Patient location during evaluation: PACU Anesthesia Type: MAC Level of consciousness: awake and alert Pain management: pain level controlled Vital Signs Assessment: post-procedure vital signs reviewed and stable Respiratory status: spontaneous breathing, nonlabored ventilation, respiratory function stable and patient connected to nasal cannula oxygen Cardiovascular status: stable and blood pressure returned to baseline Postop Assessment: no apparent nausea or vomiting Anesthetic complications: no    Last Vitals:  Vitals:   03/23/18 0503 03/23/18 0800  BP: 135/74   Pulse: 68   Resp: 19   Temp: (!) 36.4 C 36.8 C  SpO2: 100%     Last Pain:  Vitals:   03/23/18 0503  TempSrc: Oral  PainSc:                  Henok Heacock S

## 2018-03-23 NOTE — NC FL2 (Signed)
[place] MEDICAID FL2 LEVEL OF CARE SCREENING TOOL     IDENTIFICATION  Patient Name: [name] Birthdate: 1951/01/07 Sex: male Admission Date (Current Location): 03/17/2018  [facility] and [place] Number:  Herbalist and Address:  [facility],  [place], [place]      Provider Number: [id number]  Attending Physician Name and Address:  Kayleen Memos, DO  Relative Name and Phone Number:       Current Level of Care: Hospital Recommended Level of Care: [place] Prior Approval Number:    Date Approved/Denied:   PASRR Number: [id number]  Discharge Plan: SNF    Current Diagnoses: Patient Active Problem List   Diagnosis Date Noted  . Cellulitis and abscess of foot 03/17/2018  . Lesion of skin of left ear 11/30/2017  . Nausea 08/31/2017  . Chronic pain syndrome 09/18/2015  . Preventative health care 03/12/2015  . Foot ulcer, left ([place]) 12/13/2014  . Paroxysmal atrial fibrillation ([place]) 12/13/2014  . Vitamin D deficiency 11/17/2013  . Anemia 02/10/2012  . Hypertension 11/18/2011  . Obstructive sleep apnea 12/12/2009  . GERD 06/27/2008  . Diabetes type 2, uncontrolled ([place]) 12/28/2007  . DIABETIC MACULAR EDEMA 10/07/2007  . HEARING LOSS, SENSORINEURAL, BILATERAL 01/03/2007  . Diabetic peripheral neuropathy associated with type 2 diabetes mellitus ([place]) 12/16/2006  . Chronic kidney disease, stage III (moderate) ([place]) 12/01/2006  . Morbid obesity ([place]) 08/23/2006  . STATUS, OTHER TOE(S) AMPUTATION 05/21/2006    Orientation RESPIRATION BLADDER Height & Weight     Self, Time, Situation, Place  Other (Comment)(CPAP Large Adult Full Face MAsk 18breaths/min auto titrate oxygen pecent: 21%) Continent Weight: (!) 405 lb 3.8 oz (183.8 kg) Height:  '6\' 6"'$  (198.1 cm)  BEHAVIORAL SYMPTOMS/MOOD NEUROLOGICAL BOWEL NUTRITION STATUS        Diet  AMBULATORY STATUS COMMUNICATION OF NEEDS Skin   Extensive Assist Verbally  Other (Comment)(Wound/IncisionDiabeticulcer Dressing Type:Gauze,Moist to Dry PRN   Incision(Closed)FootLeft Dressing in place;clean,dry,intact)                       Personal Care Assistance Level of Assistance  Bathing, Feeding, Dressing Bathing Assistance: Maximum assistance Feeding assistance: Independent Dressing Assistance: Maximum assistance     Functional Limitations Info  Sight, Hearing, Speech Sight Info: Adequate Hearing Info: Adequate Speech Info: Adequate    SPECIAL CARE FACTORS FREQUENCY  PT (By licensed PT), OT (By licensed OT)     PT Frequency: 5x/week OT Frequency: 5x/week            Contractures      Additional Factors Info  Code Status, Allergies, Isolation Precautions, Insulin Sliding Scale Code Status Info: Full Code Allergies Info: Vancomycin   Insulin Sliding Scale Info: see discharge summary Isolation Precautions Info: Contact Precautions   Infecction:MRSA     Current Medications (03/23/2018):  This is the current hospital active medication list Current Facility-Administered Medications  Medication Dose Route Frequency Provider Last Rate Last Dose  . 0.9 %  sodium chloride infusion  250 mL Intravenous PRN Erle Crocker, MD      . acetaminophen (TYLENOL) tablet 650 mg  650 mg Oral Q6H PRN Erle Crocker, MD      . albuterol (PROVENTIL) (2.5 MG/3ML) 0.083% nebulizer solution 2.5 mg  2.5 mg Nebulization Q2H PRN Erle Crocker, MD      . amLODipine (NORVASC) tablet 5 mg  5 mg Oral Daily Erle Crocker, MD   5 mg  at 03/23/18 1037  . apixaban (ELIQUIS) tablet 5 mg  5 mg Oral BID Irene Pap N, DO   5 mg at 03/23/18 1029  . atorvastatin (LIPITOR) tablet 20 mg  20 mg Oral q1800 Erle Crocker, MD   20 mg at 03/22/18 1733  . brinzolamide (AZOPT) 1 % ophthalmic suspension 1 drop  1 drop Both Eyes BID Erle Crocker, MD   1 drop at 03/23/18 1031  . cephALEXin (KEFLEX) capsule 500 mg  500 mg Oral Q12H Hall,  Carole N, DO   500 mg at 03/23/18 1028  . cholecalciferol (VITAMIN D) tablet 2,000 Units  2,000 Units Oral Daily Erle Crocker, MD   2,000 Units at 03/23/18 1028  . fentaNYL (SUBLIMAZE) injection 50-100 mcg  50-100 mcg Intravenous UD Erle Crocker, MD   50 mcg at 03/21/18 1655  . folic acid (FOLVITE) tablet 1 mg  1 mg Oral Daily Erle Crocker, MD   1 mg at 03/23/18 1029  . gabapentin (NEURONTIN) capsule 300 mg  300 mg Oral TID Irene Pap N, DO   300 mg at 03/23/18 1029  . hydrALAZINE (APRESOLINE) injection 10 mg  10 mg Intravenous Q6H PRN Erle Crocker, MD      . HYDROcodone-acetaminophen [facility]) 7.5-325 MG per tablet 1-2 tablet  1-2 tablet Oral Q6H PRN Erle Crocker, MD   2 tablet at 03/23/18 0135  . insulin aspart (novoLOG) injection 0-20 Units  0-20 Units Subcutaneous TID WC Erle Crocker, MD   4 Units at 03/22/18 1345  . insulin aspart (novoLOG) injection 0-5 Units  0-5 Units Subcutaneous QHS Erle Crocker, MD      . insulin aspart (novoLOG) injection 12 Units  12 Units Subcutaneous TID WC Erle Crocker, MD   12 Units at 03/22/18 1346  . insulin glargine (LANTUS) injection 25 Units  25 Units Subcutaneous BID Kayleen Memos, DO   25 Units at 03/23/18 1028  . latanoprost (XALATAN) 0.005 % ophthalmic solution 1 drop  1 drop Both Eyes QHS Erle Crocker, MD   1 drop at 03/22/18 2151  . metoprolol succinate (TOPROL-XL) 24 hr tablet 25 mg  25 mg Oral q1800 Erle Crocker, MD   25 mg at 03/22/18 1733  . midazolam (VERSED) injection 1-2 mg  1-2 mg Intravenous UD Erle Crocker, MD   1 mg at 03/21/18 1655  . multivitamin with minerals tablet 1 tablet  1 tablet Oral Daily Erle Crocker, MD   1 tablet at 03/23/18 1029  . ondansetron (ZOFRAN) tablet 4 mg  4 mg Oral Q6H PRN Erle Crocker, MD       Or  . ondansetron [facility]) injection 4 mg  4 mg Intravenous Q6H PRN Erle Crocker, MD   4 mg at 03/18/18 2149  .  polyethylene glycol (MIRALAX / GLYCOLAX) packet 17 g  17 g Oral BID Erle Crocker, MD   17 g at 03/23/18 1028  . senna-docusate (Senokot-S) tablet 2 tablet  2 tablet Oral BID Erle Crocker, MD   2 tablet at 03/23/18 1028  . sodium chloride flush (NS) 0.9 % injection 3 mL  3 mL Intravenous Q12H Erle Crocker, MD   3 mL at 03/22/18 2152  . sodium chloride flush (NS) 0.9 % injection 3 mL  3 mL Intravenous PRN Erle Crocker, MD      . thiamine (VITAMIN B-1) tablet 100 mg  100 mg Oral Daily Lucia Gaskins,  Lisette Grinder, MD   100 mg at 03/23/18 1029   Or  . thiamine (B-1) injection 100 mg  100 mg Intravenous Daily Erle Crocker, MD      . traZODone (DESYREL) tablet 50 mg  50 mg Oral QHS PRN Erle Crocker, MD      . V-GO 20 KIT 1 application  1 application Continuous infusion (non-IV) Daily Erle Crocker, MD         Discharge Medications: Please see discharge summary for a list of discharge medications.  Relevant Imaging Results:  Relevant Lab Results:   Additional Information SSN [id number]  Burnis Medin, LCSW

## 2018-03-24 DIAGNOSIS — L97521 Non-pressure chronic ulcer of other part of left foot limited to breakdown of skin: Secondary | ICD-10-CM | POA: Diagnosis not present

## 2018-03-24 DIAGNOSIS — I10 Essential (primary) hypertension: Secondary | ICD-10-CM | POA: Diagnosis not present

## 2018-03-24 DIAGNOSIS — Z4781 Encounter for orthopedic aftercare following surgical amputation: Secondary | ICD-10-CM | POA: Diagnosis not present

## 2018-03-24 DIAGNOSIS — M79672 Pain in left foot: Secondary | ICD-10-CM | POA: Diagnosis not present

## 2018-03-24 DIAGNOSIS — E11621 Type 2 diabetes mellitus with foot ulcer: Secondary | ICD-10-CM | POA: Diagnosis not present

## 2018-03-24 DIAGNOSIS — L03116 Cellulitis of left lower limb: Secondary | ICD-10-CM | POA: Diagnosis not present

## 2018-03-24 DIAGNOSIS — L03119 Cellulitis of unspecified part of limb: Secondary | ICD-10-CM | POA: Diagnosis not present

## 2018-03-24 DIAGNOSIS — R279 Unspecified lack of coordination: Secondary | ICD-10-CM | POA: Diagnosis not present

## 2018-03-24 DIAGNOSIS — M545 Low back pain: Secondary | ICD-10-CM | POA: Diagnosis not present

## 2018-03-24 DIAGNOSIS — E11649 Type 2 diabetes mellitus with hypoglycemia without coma: Secondary | ICD-10-CM | POA: Diagnosis not present

## 2018-03-24 DIAGNOSIS — M24572 Contracture, left ankle: Secondary | ICD-10-CM | POA: Diagnosis not present

## 2018-03-24 DIAGNOSIS — I480 Paroxysmal atrial fibrillation: Secondary | ICD-10-CM | POA: Diagnosis not present

## 2018-03-24 DIAGNOSIS — I5032 Chronic diastolic (congestive) heart failure: Secondary | ICD-10-CM | POA: Diagnosis not present

## 2018-03-24 DIAGNOSIS — L97529 Non-pressure chronic ulcer of other part of left foot with unspecified severity: Secondary | ICD-10-CM | POA: Diagnosis not present

## 2018-03-24 DIAGNOSIS — Z794 Long term (current) use of insulin: Secondary | ICD-10-CM | POA: Diagnosis not present

## 2018-03-24 DIAGNOSIS — G4733 Obstructive sleep apnea (adult) (pediatric): Secondary | ICD-10-CM | POA: Diagnosis not present

## 2018-03-24 DIAGNOSIS — R262 Difficulty in walking, not elsewhere classified: Secondary | ICD-10-CM | POA: Diagnosis not present

## 2018-03-24 DIAGNOSIS — M869 Osteomyelitis, unspecified: Secondary | ICD-10-CM | POA: Diagnosis not present

## 2018-03-24 DIAGNOSIS — Z743 Need for continuous supervision: Secondary | ICD-10-CM | POA: Diagnosis not present

## 2018-03-24 DIAGNOSIS — Z89422 Acquired absence of other left toe(s): Secondary | ICD-10-CM | POA: Diagnosis not present

## 2018-03-24 DIAGNOSIS — Z79891 Long term (current) use of opiate analgesic: Secondary | ICD-10-CM | POA: Diagnosis not present

## 2018-03-24 DIAGNOSIS — E119 Type 2 diabetes mellitus without complications: Secondary | ICD-10-CM | POA: Diagnosis not present

## 2018-03-24 DIAGNOSIS — I152 Hypertension secondary to endocrine disorders: Secondary | ICD-10-CM | POA: Diagnosis not present

## 2018-03-24 DIAGNOSIS — N183 Chronic kidney disease, stage 3 (moderate): Secondary | ICD-10-CM | POA: Diagnosis not present

## 2018-03-24 DIAGNOSIS — M6281 Muscle weakness (generalized): Secondary | ICD-10-CM | POA: Diagnosis not present

## 2018-03-24 DIAGNOSIS — G894 Chronic pain syndrome: Secondary | ICD-10-CM | POA: Diagnosis not present

## 2018-03-24 DIAGNOSIS — E1142 Type 2 diabetes mellitus with diabetic polyneuropathy: Secondary | ICD-10-CM | POA: Diagnosis not present

## 2018-03-24 LAB — CBC
HCT: 24.3 % — ABNORMAL LOW (ref 39.0–52.0)
Hemoglobin: 8 g/dL — ABNORMAL LOW (ref 13.0–17.0)
MCH: 28.7 pg (ref 26.0–34.0)
MCHC: 32.9 g/dL (ref 30.0–36.0)
MCV: 87.1 fL (ref 78.0–100.0)
Platelets: 230 10*3/uL (ref 150–400)
RBC: 2.79 MIL/uL — ABNORMAL LOW (ref 4.22–5.81)
RDW: 15.6 % — AB (ref 11.5–15.5)
WBC: 7.6 10*3/uL (ref 4.0–10.5)

## 2018-03-24 LAB — CREATININE, SERUM
Creatinine, Ser: 1.49 mg/dL — ABNORMAL HIGH (ref 0.61–1.24)
GFR calc non Af Amer: 46 mL/min — ABNORMAL LOW (ref >60)
GFR, EST AFRICAN AMERICAN: 54 mL/min — AB (ref >60)

## 2018-03-24 LAB — GLUCOSE, CAPILLARY
GLUCOSE-CAPILLARY: 113 mg/dL — AB (ref 70–99)
Glucose-Capillary: 229 mg/dL — ABNORMAL HIGH (ref 70–99)

## 2018-03-24 MED ORDER — AMLODIPINE BESYLATE 10 MG PO TABS: 10.0000 mg | ORAL_TABLET | Freq: Every day | ORAL | 0 refills | Status: DC

## 2018-03-24 NOTE — Progress Notes (Signed)
Subjective: 3 Days Post-Op Procedure(s) (LRB): TRANSMETATARSAL AMPUTATION LEFT FOOT (Left) ACHILLES TENDON LENGTHENING (Left) Patient reports pain as 0 on 0-10 scale.    He is doing well.  Awaiting SNF placement.   No concerns.  No fevers.   Objective: Vital signs in last 24 hours: Temp:  [97.6 F (36.4 C)-99.7 F (37.6 C)] 97.6 F (36.4 C) (09/12 0515) Pulse Rate:  [78-120] 90 (09/12 0559) Resp:  [16-21] 18 (09/12 0559) BP: (136-182)/(59-78) 141/76 (09/12 0515) SpO2:  [98 %-100 %] 98 % (09/12 0515)  Intake/Output from previous day: 09/11 0701 - 09/12 0700 In: 774.6 [P.O.:640; I.V.:134.6] Out: 675 [Urine:675] Intake/Output this shift: Total I/O In: -  Out: 600 [Urine:600]  Recent Labs    03/22/18 0542 03/23/18 0629 03/24/18 0537  HGB 8.1* 7.8*  7.8* 8.0*   Recent Labs    03/23/18 0629 03/24/18 0537  WBC 7.8  7.8 7.6  RBC 2.76*  2.79* 2.79*  HCT 24.4*  24.7* 24.3*  PLT 208  215 230   Recent Labs    03/22/18 0542 03/23/18 0629 03/24/18 0537  NA 138 139  --   K 4.9 4.4  --   CL 109 110  --   CO2 24 24  --   BUN 20 20  --   CREATININE 1.66* 1.61*  1.62* 1.49*  GLUCOSE 238* 90  --   CALCIUM 8.0* 8.2*  --    No results for input(s): LABPT, INR in the last 72 hours.    Wake and alert Respirations even unlabored LLE in splint clean and dry. No drainage or strikethrough. Maximal leg soft.  Assessment/Plan: 3 Days Post-Op Procedure(s) (LRB): TRANSMETATARSAL AMPUTATION LEFT FOOT (Left) ACHILLES TENDON LENGTHENING (Left) Discharge to SNF appropriate medical team.  See separate note for orthopedic discharge instructions.    Erle Crocker 03/24/2018, 8:07 AM

## 2018-03-24 NOTE — Discharge Summary (Signed)
Discharge Summary  [name] TTS:[id number] DOB: [birth year]  PCP: Aldine Contes, MD  Admit date: 03/17/2018 Discharge date: 03/24/2018   UPDATE: Discharge delayed due to insurance authorization.  03/24/2018: Patient seen and examined at his bedside.  Pain is well controlled on current pain medications.  He has no new complaints.  On the day of discharge, the patient was hemodynamically stable.  He will need to follow-up with orthopedic surgery.  Recommendations per orthopedic surgery as stated below:  Orthopeadic surgery discharge instructions:  Nonweightbearing left lower extremity. Do not remove splint or dressing. Elevate limb when possible. Continue anticoagulation for DVT prophylaxis. Continue antibiotics per internal medicine team. Follow up with me in 2 weeks.  Will call for appointment. Please call [place] with questions.    Time spent: 25 minutes  Recommendations for Outpatient Follow-up:  1. Follow-up with orthopedic surgery 2. Follow-up with PCP 3. Take medications as prescribed 4. Continue physical therapy 5. Fall precautions  Discharge Diagnoses:  Active Hospital Problems   Diagnosis Date Noted  . Cellulitis and abscess of foot 03/17/2018  . Foot ulcer, left ([place]) 12/13/2014  . Paroxysmal atrial fibrillation ([place]) 12/13/2014  . Hypertension 11/18/2011  . Obstructive sleep apnea 12/12/2009  . Diabetes type 2, uncontrolled ([place]) 12/28/2007  . Diabetic peripheral neuropathy associated with type 2 diabetes mellitus ([place]) 12/16/2006  . Chronic kidney disease, stage III (moderate) ([place]) 12/01/2006  . Morbid obesity ([place]) 08/23/2006    Resolved Hospital Problems  No resolved problems to display.    Discharge Condition: Stable  Diet recommendation: Heart healthy diabetic diet  Vitals:   03/24/18 0559 03/24/18 0854  BP:  138/78  Pulse: 90 90  Resp: 18 18  Temp:    SpO2:      History of present  illness:  [name], [name], GERD, [name], HTN, morbid obesity, [name], progressively worsening left foot wound and left lower extremity edema. Bonesample on 8/29/19sent for culture which came back positive for Proteus mirabilis, Pseudomonas aeruginosa, and MRSA. Was previously onLevaquin and doxycycline for 2 days. MRI confirmed left foot osteomyelitis on 03/18/18. Continued on IV cefepime, IV flagyl, and IV vancomycin.  IV vancomycin discontinued due to negative MRSA screening on 03/18/2018.  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  Hospital course complicated by episodes of hypoglycemia with blood sugar as low as 60s which were corrected.  Suspect secondary to poor oral intake with high dose maintenance insulin.  Last hemoglobin A1c July 2019 was 8.0.  03/23/2018: [name]  Reports intermittent shooting pain in his left foot.  Increased frequency of gabapentin from 300 mg nightly to 300 mg 3 times daily.  Patient denies chest pain, dyspnea, palpitations, nausea, abdominal cramping.   Hospital Course:  Principal Problem:   Cellulitis and abscess of foot Active Problems:   Diabetes type 2, uncontrolled ([place])   Obstructive sleep apnea   Diabetic peripheral neuropathy associated with type 2 diabetes mellitus (HCC)   Chronic kidney disease, stage III (moderate) (HCC)   Morbid obesity ([place])   Hypertension   Foot ulcer, left (HCC)   Paroxysmal atrial fibrillation (HCC)  POD#3 post left metatarsal amputation with bone culture positive for protein mirabilis, Pseudomonas aeruginosa MRI done at [facility] on 03/18/2018 confirmed osteomyelitis affecting the fourth metatarsal head and fourth toe complicated by abscesses in the second and third metatarsal region  and cellulitis blood cultures x2 peripherally done on 03/18/2018 no  growth to date Completed 7 days of IV antibiotics which included IV cefepime, IV Flagyl, and IV vancomycin Stopped IV vancomycin on 03/18/2018 Left transmetatarsal amputation done on 03/21/2018 by orthopedic surgery  Uncontrolled type 2 diabetes with hyperglycemia and transient hypoglycemia Last A1c from July 2019 revealed hemoglobin A1c of 8.0 Continue antidiabetic regimen Follow-up with your primary care provider outpatient  Resolved accelerated hypertension Increased amlodipine to 10 mg daily Also on metoprolol succinate Follow-up with your primary care provider  Paroxysmal A. fib Rate controlled on beta-blocker Resume Eliquis as recommended by orthopedic surgery  Morbid obesity BMI 46 Weight loss outpatient with healthy dieting and regular physical activity  OSA, he is compliant with his CPAP C/w CPAP at night  AKI on CKD 3 improving Back to his baseline creatinine 1.6 with GFR of 49 Creatinine on presentation 1.91 with GFR of 40 Continue to avoid nephrotoxic agents/dehydration/hypotension Follow-up with your primary care provider  Alcohol abuse Continue CIWA protocol Continue multivitamins including folic acid and thiamine  Acute blood loss anemia in the setting of chronic normocytic anemia No sign of overt bleeding Hemoglobin 9.4 with baseline hemoglobin of 11 MCV 88 Hemoglobin 7.8, baseline 9 Follow-up with your PCP posthospitalization  Chronic diastolic CHF Last 2D echo done on 12/14/2014 revealed preserved LVEF 55%, regional wall motion abnormalities could not be excluded. Strict I's and O's Daily weight  Code Status: Full code  Family Communication:  Wife at bedside.  All questions answered to her satisfaction.    Consultants:  Orthopedic surgery  Procedures:  Left transmetatarsal amputation planned on 03/21/2018  Antimicrobials: Completed IV cefepime, IV Flagyl and IV vancomycin   DVT prophylaxis:  Eliquis   Discharge Exam: BP  138/78   Pulse 90   Temp 97.6 F (36.4 C)   Resp 18   Ht '6\' 6"'$  (1.981 m)   Wt (!) 183.8 kg   SpO2 98%   BMI 46.83 kg/m  . General: 67 y.o. year-old male obese in no acute distress.  Alert and oriented x3. . Cardiovascular: Regular rate and rhythm with no rubs or gallops.  No thyromegaly or JVD noted. Marland Kitchen Respiratory: Clear to auscultation with no wheezes or rales. Good inspiratory effort. . Abdomen: Soft nontender nondistended with normal bowel sounds x4 quadrants. . Musculoskeletal: Left lower extremity is in a surgical dressing. Marland Kitchen Psychiatry: Mood is appropriate for condition and setting  Discharge Instructions You were cared for by a hospitalist during your hospital stay. If you have any questions about your discharge medications or the care you received while you were in the hospital after you are discharged, you can call the unit and asked to speak with the hospitalist on call if the hospitalist that took care of you is not available. Once you are discharged, your primary care physician will handle any further medical issues. Please note that NO REFILLS for any discharge medications will be authorized once you are discharged, as it is imperative that you return to your primary care physician (or establish a relationship with a primary care physician if you do not have one) for your aftercare needs so that they can reassess your need for medications and monitor your lab values.   Allergies as of 03/24/2018      Reactions   Vancomycin    REACTION: ARF      Medication List    STOP taking these medications   doxycycline 100 MG capsule Commonly known as:  MONODOX   enalapril 20 MG tablet Commonly known as:  VASOTEC   furosemide 40 MG tablet Commonly known as:  LASIX   gabapentin 300 MG capsule Commonly known as:  NEURONTIN   HUMULIN R 500 UNIT/ML injection Generic drug:  insulin regular human CONCENTRATED   HUMULIN R U-500 KWIKPEN 500 UNIT/ML kwikpen Generic drug:  insulin  regular human CONCENTRATED   levofloxacin 500 MG tablet Commonly known as:  LEVAQUIN   Vitamin D3 2000 units capsule Replaced by:  cholecalciferol 1000 units tablet     TAKE these medications   ACCU-CHEK AVIVA PLUS test strip Generic drug:  glucose blood USE TO CHECK BLOOD FIVE TIMES DAILY   ACCU-CHEK AVIVA PLUS w/Device Kit CHECK BLOOD SUGAR 5 TIMES A DAY   ACCU-CHEK FASTCLIX LANCETS Misc Check blood sugar 5 times a day   albuterol 108 (90 Base) MCG/ACT inhaler Commonly known as:  PROVENTIL HFA;VENTOLIN HFA Inhale 2 puffs into the lungs every 6 (six) hours as needed for wheezing or shortness of breath.   amLODipine 10 MG tablet Commonly known as:  NORVASC Take 1 tablet (10 mg total) by mouth daily. Start taking on:  03/25/2018   apixaban 5 MG Tabs tablet Commonly known as:  ELIQUIS Take 1 tablet (5 mg total) by mouth 2 (two) times daily. What changed:  See the new instructions.   atorvastatin 20 MG tablet Commonly known as:  LIPITOR Take 1 tablet (20 mg total) by mouth daily at 6 PM. What changed:    how much to take  how to take this  when to take this  additional instructions   AZOPT 1 % ophthalmic suspension Generic drug:  brinzolamide Place 1 drop into both eyes 2 (two) times daily.   becaplermin 0.01 % gel Commonly known as:  REGRANEX Apply 1 application topically daily.   cephALEXin 500 MG capsule Commonly known as:  KEFLEX Take 1 capsule (500 mg total) by mouth every 12 (twelve) hours.   cholecalciferol 1000 units tablet Commonly known as:  VITAMIN D Take 2 tablets (2,000 Units total) by mouth daily. Replaces:  Vitamin D3 2000 units capsule   diclofenac sodium 1 % Gel Commonly known as:  VOLTAREN Apply 4 g topically 4 (four) times daily.   docusate sodium 100 MG capsule Commonly known as:  COLACE Take 1 capsule (100 mg total) by mouth daily as needed for mild constipation.   folic acid 1 MG tablet Commonly known as:  FOLVITE Take 1  tablet (1 mg total) by mouth daily.   FREESTYLE LIBRE 14 DAY SENSOR Misc 1 each by Does not apply route 6 (six) times daily.   HYDROcodone-acetaminophen 7.5-325 MG tablet Commonly known as:  NORCO Take 1 tablet by mouth every 8 (eight) hours as needed for moderate pain or severe pain. What changed:    when to take this  reasons to take this   insulin lispro 100 UNIT/ML injection Commonly known as:  HUMALOG Inject 0.1 mLs (10 Units total) into the skin 3 (three) times daily with meals.   latanoprost 0.005 % ophthalmic solution Commonly known as:  XALATAN Place 1 drop into both eyes at bedtime.   metoprolol succinate 25 MG 24 hr tablet Commonly known as:  TOPROL-XL Take 1 tablet (25 mg total) by mouth daily at 6 PM. What changed:    how much to take  how to take this  when to take this  additional instructions   multivitamin with minerals Tabs tablet Take 1 tablet by  mouth daily.   polyethylene glycol packet Commonly known as:  MIRALAX / GLYCOLAX Take 17 g by mouth daily.   promethazine 25 MG tablet Commonly known as:  PHENERGAN TAKE 1 TABLET BY MOUTH  EVERY 8 HOURS AS NEEDED FOR NAUSEA OR VOMITING   silver sulfADIAZINE 1 % cream Commonly known as:  SILVADENE Apply 1 application topically daily.   SIMBRINZA 1-0.2 % Susp Generic drug:  Brinzolamide-Brimonidine Place 1 drop into both eyes 2 (two) times daily.   thiamine 100 MG tablet Take 1 tablet (100 mg total) by mouth daily.   V-GO 20 Kit 1 application by Continuous infusion (non-IV) route daily. USE 1 PUMP DAILY WITH INSULIN   VICTOZA 18 MG/3ML Sopn Generic drug:  liraglutide INJECT 1.8 MG ONCE DAILY AT THE SAME TIME What changed:  See the new instructions.            Durable Medical Equipment  (From admission, onward)         Start     Ordered   03/23/18 0605  For home use only DME Walker rolling  Once    Question:  Patient needs a walker to treat with the following condition  Answer:   Ambulatory dysfunction   03/23/18 0604   03/23/18 0604  For home use only DME 3 n 1  Once     03/23/18 0604   03/23/18 0604  For home use only DME wheelchair cushion (seat and back)  Once     03/23/18 0604         Allergies  Allergen Reactions  . Vancomycin     REACTION: ARF    Contact information for follow-up providers    Erle Crocker, MD. Call in 2 weeks.   Specialty:  Orthopedic Surgery Why:  Please call for a two weekpost operative appointment Contact information: [place] [address] [phone]        Aldine Contes, MD. Call in 1 day(s).   Specialty:  Internal Medicine Why:  Please call for an appointment. Contact information: [address], [address] [place] [address] [phone]            Contact information for after-discharge care    Destination    [facility] .   Service:  Skilled Nursing Contact information: [place] [phone]                   The results of significant diagnostics from this hospitalization (including imaging, microbiology, ancillary and laboratory) are listed below for reference.    Significant Diagnostic Studies: Mr Foot Left Wo Contrast  Result Date: 03/18/2018 CLINICAL DATA:  History of amputation at the distal second and third metatarsals. Open draining wound at the amputation site. Question osteomyelitis. EXAM: MRI OF THE LEFT FOOT WITHOUT CONTRAST TECHNIQUE: Multiplanar, multisequence MR imaging of the left foot was performed. No intravenous contrast was administered. COMPARISON:  Plain films left foot 03/11/2018 and 11/23/2017. FINDINGS: Bones/Joint/Cartilage Postoperative change of amputation at the level of the distal second and third metatarsals is identified. Marrow edema in the distal 4.1 cm of the second metatarsal is most intense in the head and neck. Moderate edema is seen in almost the entire third  metatarsal remnant extending 6.5 cm in length. Edema is also seen in the head of the fourth metatarsal and throughout the fourth toe. Mild increased T2 signal in the proximal phalanx of the fifth toe could be artifactual as it  is at the edge of the field. Ligaments Intact. Muscles and Tendons Marked atrophy of intrinsic musculature the foot is seen. No intramuscular fluid collection. Soft tissues Subcutaneous edema is present in the dorsum of the foot. There is a focal fluid collection measuring 2.2 cm craniocaudal by 3.1 cm long by 0.6 cm transverse superficial to the distal aspect of the third metatarsal remnant. This collection extends to an open wound at the stump. A second fluid collection along the medial margin of the distal second metatarsal remnant measures 1.2 cm craniocaudal by 0.2 cm transverse by 1 cm long. IMPRESSION: Marrow edema in the distal 4.1 cm of the second metatarsal and throughout almost the entire third metatarsal is consistent with osteomyelitis. Edema is also seen in the fourth metatarsal head and fourth toe consistent with osteomyelitis. Mild increased T2 signal in the proximal phalanx of the fifth toe could be due to reactive change but may also be artifactual. Abscess at the distal aspect of the remnant of the third metatarsal extends to the skin surface. A second abscess is seen along the medial margin of the distal second metatarsal remnant. Diffuse subcutaneous edema about the foot consistent with cellulitis. Electronically Signed   By: Inge Rise M.D.   On: 03/18/2018 10:33   Dg Foot Complete Left  Result Date: 03/11/2018 CLINICAL DATA:  68 year old male with osteomyelitis of the left foot EXAM: LEFT FOOT - COMPLETE 3+ VIEW COMPARISON:  Prior radiographs 11/23/2017 FINDINGS: Surgical changes of prior left second and third digital amputation extending through the heads of the metatarsals. Compared to the prior imaging, there has been development of a small amount of  heterotopic ossification adjacent to the second metatarsal amputation site. No evidence of new bony destruction or refraction to suggest a new site of osteomyelitis. Soft tissue swelling is present in the region of the amputation site suggesting cellulitis. IMPRESSION: 1. Soft tissue swelling around the forefoot in the region of the first toe and second and third toe amputation site concerning for cellulitis. 2. Developing heterotopic ossification adjacent to the second metatarsal head amputation site. 3. No definitive conventional radiographic findings of new osteomyelitis. Electronically Signed   By: Jacqulynn Cadet M.D.   On: 03/11/2018 13:54    Microbiology: Recent Results (from the past 240 hour(s))  MRSA PCR Screening     Status: None   Collection Time: 03/18/18 11:13 AM  Result Value Ref Range Status   MRSA by PCR NEGATIVE NEGATIVE Final    Comment:        The GeneXpert MRSA Assay (FDA approved for NASAL specimens only), is one component of a comprehensive MRSA colonization surveillance program. It is not intended to diagnose MRSA infection nor to guide or monitor treatment for MRSA infections. Performed at [facility], [place] [address] [place], [place] [address]   Culture, blood (routine x 2)     Status: None   Collection Time: 03/18/18  3:16 PM  Result Value Ref Range Status   Specimen Description   Final    BLOOD LEFT ANTECUBITAL Performed at [facility], [place] [address], [place], [place] [address]    Special Requests   Final    BOTTLES DRAWN AEROBIC AND ANAEROBIC Blood Culture adequate volume Performed at [facility] [address] [place], [place] [address]    Culture   Final    NO GROWTH 5 DAYS Performed at [facility], [place] [address] [place], [place] [address]  Report Status 03/23/2018 FINAL  Final  Culture, blood (routine x 2)     Status: None   Collection Time: 03/18/18  3:18 PM  Result Value Ref Range  Status   Specimen Description   Final    BLOOD RIGHT FOREARM Performed at [facility] [address], [place], [place] [address]    Special Requests   Final    BOTTLES DRAWN AEROBIC AND ANAEROBIC Blood Culture adequate volume Performed at [facility] [address], [place], [place] [address]    Culture   Final    NO GROWTH 5 DAYS Performed at [facility], [place] [address] [place], [place] [address]    Report Status 03/23/2018 FINAL  Final     Labs: Basic Metabolic Panel: Recent Labs  Lab 03/19/18 [phone] 03/20/18 0627 03/21/18 0527 03/22/18 0542 03/23/18 0629 03/24/18 0537  NA 138 140 139 138 139  --   K 4.9 4.5 4.0 4.9 4.4  --   CL 106 107 108 109 110  --   CO2 '23 23 25 24 24  '$ --   GLUCOSE 195* 101* 57* 238* 90  --   BUN '23 22 20 20 20  '$ --   CREATININE 1.63* 1.47* 1.41*  1.41* 1.66* 1.61*  1.62* 1.49*  CALCIUM 8.6* 8.7* 8.5* 8.0* 8.2*  --   MG 2.1  --   --   --  2.1  --    Liver Function Tests: No results for input(s): AST, ALT, ALKPHOS, BILITOT, PROT, ALBUMIN in the last 168 hours. No results for input(s): LIPASE, AMYLASE in the last 168 hours. No results for input(s): AMMONIA in the last 168 hours. CBC: Recent Labs  Lab 03/20/18 0627 03/21/18 0527 03/22/18 0542 03/23/18 0629 03/24/18 0537  WBC 7.3 8.0 6.7 7.8  7.8 7.6  HGB 9.7* 9.5* 8.1* 7.8*  7.8* 8.0*  HCT 30.6* 30.0* 25.2* 24.4*  24.7* 24.3*  MCV 88.4 87.5 89.0 88.4  88.5 87.1  PLT 273 295 217 208  215 230   Cardiac Enzymes: No results for input(s): CKTOTAL, CKMB, CKMBINDEX, TROPONINI in the last 168 hours. BNP: BNP (last 3 results) No results for input(s): BNP in the last 8760 hours.  ProBNP (last 3 results) No results for input(s): PROBNP in the last 8760 hours.  CBG: Recent Labs  Lab 03/23/18 1030 03/23/18 1121 03/23/18 1746 03/23/18 2126 03/24/18 0745  GLUCAP 168* 171* 137* 188* 113*       Signed:  Kayleen Memos, MD [facility] 03/24/2018, 11:39 AM

## 2018-03-24 NOTE — Progress Notes (Signed)
RN called to SNF for report. RN [name] received a report and had no question at this time. Patient will go to SNF [facility] by [facility]

## 2018-03-24 NOTE — Care Management Important Message (Signed)
Important Message  Patient Details  Name: [name] MRN: [id number] Date of Birth: [birth year]   Medicare Important Message Given:  Yes    Kerin Salen 03/24/2018, 12:12 [place] Message  Patient Details  Name: [name] MRN: [id number] Date of Birth: 1951/06/17   Medicare Important Message Given:  Yes    Kerin Salen 03/24/2018, 12:12 PM

## 2018-03-24 NOTE — Consult Note (Signed)
   [facility] CM Inpatient Consult   03/24/2018  [name] 28-Mar-1951 [id number]    [facility] Care Management follow up.   Chart reviewed. [name] is slated for discharge to SNF. Confirmed with inpatient LCSW.   Spoke with [name] and wife at bedside. [name] states he will call [name] post discharge from SNF. Wife is agreeable to this as well.   Marthenia Rolling, MSN-Ed, RN,BSN [facility] Liaison [phone]

## 2018-03-24 NOTE — Clinical Social Work Placement (Signed)
Patient received and accepted bed offer at [facility]. Facility aware of patient's discharge and confirmed bed offer [facility] insurance authorization received). PTAR contacted, patient aware. Patient's RN can call report to [phone], packet complete. CSW signing off, no other needs identified at this time.  CLINICAL SOCIAL WORK PLACEMENT  NOTE  Date:  03/24/2018  Patient Details  Name: [name] MRN: [id number] Date of Birth: May 26, 1951  Clinical Social Work is seeking post-discharge placement for this patient at the [place] level of care (*CSW will initial, date and re-position this form in  chart as items are completed):  Yes   Patient/family provided with [facility] Work Department's list of facilities offering this level of care within the geographic area requested by the patient (or if unable, by the patient's family).  Yes   Patient/family informed of their freedom to choose among providers that offer the needed level of care, that participate in Medicare, Medicaid or managed care program needed by the patient, have an available bed and are willing to accept the patient.  Yes   Patient/family informed of [facility]'s ownership interest in [facility] and [facility], as well as of the fact that they are under no obligation to receive care at these facilities.  PASRR submitted to EDS on 03/23/18     PASRR number received on 03/23/18     Existing PASRR number confirmed on       FL2 transmitted to all facilities in geographic area requested by pt/family on 03/23/18     FL2 transmitted to all facilities within larger geographic area on       Patient informed that his/her managed care company has contracts with or will negotiate with certain facilities, including the following:        Yes   Patient/family informed of bed offers received.  Patient chooses bed at [facility]      Physician recommends and patient chooses bed at      Patient to be transferred to [facility] on 03/24/18.  Patient to be transferred to facility by PTAR     Patient family notified on 03/24/18 of transfer.  Name of family member notified:  Patient reported that he would notify his wife     PHYSICIAN       Additional Comment:    _______________________________________________ Burnis Medin, LCSW 03/24/2018, 12:41 PM

## 2018-03-24 NOTE — Progress Notes (Signed)
Orthopeadic surgery discharge instructions:  Nonweightbearing left lower extremity. Do not remove splint or dressing. Elevate limb when possible. Continue anticoagulation for DVT prophylaxis. Continue antibiotics per internal medicine team. Follow up with me in 2 weeks.  Will call for appointment. Please call [facility] [place] with questions.

## 2018-03-24 NOTE — Progress Notes (Signed)
Physical Therapy Treatment Patient Details Name: [name] MRN: [id number] DOB: 09/08/1950 Today's Date: 03/24/2018    History of Present Illness 67 y.o. male with PMH of DM, obesity admitted with osteomyelitis L foot, s/p transmetatarsal amputation and gastroc lengthening. NWB LLE.     PT Comments    Pt tolerating increased distance of 12' with ambulation.   Follow Up Recommendations  SNF;Supervision for mobility/OOB     Equipment Recommendations  Wheelchair (measurements PT);3in1 (PT);Rolling walker with 5" wheels(bariatric equipment)    Recommendations for Other Services       Precautions / Restrictions Precautions Precautions: Fall Precaution Comments: 1 fall in past 1 year Restrictions Weight Bearing Restrictions: Yes LLE Weight Bearing: Non weight bearing    Mobility  Bed Mobility               General bed mobility comments: up in recliner  Transfers Overall transfer level: Needs assistance Equipment used: Rolling walker (2 wheeled) Transfers: Sit to/from Stand Sit to Stand: Min guard         General transfer comment: stood from recliner using armrests  Ambulation/Gait   Gait Distance (Feet): 12 Feet Assistive device: Rolling walker (2 wheeled) Gait Pattern/deviations: Step-to pattern     General Gait Details: min/guard for safety, no loss of balance, good adherence to NWB status LLE   Stairs             Wheelchair Mobility    Modified Rankin (Stroke Patients Only)       Balance Overall balance assessment: Needs assistance   Sitting balance-Leahy Scale: Good     Standing balance support: Bilateral upper extremity supported Standing balance-Leahy Scale: Poor Standing balance comment: relies on BUE support                            Cognition Arousal/Alertness: Awake/alert Behavior During Therapy: WFL for tasks assessed/performed Overall Cognitive Status: Within Functional Limits for tasks assessed                                        Exercises      General Comments        Pertinent Vitals/Pain Pain Score: 4  Pain Location: LLE Pain Descriptors / Indicators: Sore Pain Intervention(s): Limited activity within patient's tolerance;Monitored during session;Premedicated before session    Home Living                      Prior Function            PT Goals (current goals can now be found in the care plan section) Acute Rehab PT Goals Patient Stated Goal: walk more to loose weight, go to [facility] PT Goal Formulation: With patient/family Time For Goal Achievement: 04/05/18 Potential to Achieve Goals: Good Progress towards PT goals: Progressing toward goals    Frequency    Min 3X/week      PT Plan Current plan remains appropriate    Co-evaluation              AM-PAC PT "6 Clicks" Daily Activity  Outcome Measure  Difficulty turning over in bed (including adjusting bedclothes, sheets and blankets)?: A Little Difficulty moving from lying on back to sitting on the side of the bed? : A Little Difficulty sitting down on and standing up from a chair with arms (e.g., wheelchair, bedside  commode, etc,.)?: A Little Help needed moving to and from a bed to chair (including a wheelchair)?: A Little Help needed walking in hospital room?: A Little Help needed climbing 3-5 steps with a railing? : Total 6 Click Score: 16    End of Session Equipment Utilized During Treatment: Gait belt Activity Tolerance: Patient tolerated treatment well Patient left: with call bell/phone within reach;with family/visitor present;Other (comment)(on commode) Nurse Communication: Mobility status PT Visit Diagnosis: Unsteadiness on feet (R26.81);Difficulty in walking, not elsewhere classified (R26.2);Pain Pain - Right/Left: Left Pain - part of body: Ankle and joints of foot     Time: 5320-2334 PT Time Calculation (min) (ACUTE ONLY): 9 min  Charges:  $Gait Training:  8-22 mins                       Blondell Reveal Kistler PT 03/24/2018  Acute Rehabilitation Services Pager [phone] Office [phone]

## 2018-03-28 DIAGNOSIS — I5032 Chronic diastolic (congestive) heart failure: Secondary | ICD-10-CM | POA: Diagnosis not present

## 2018-03-28 DIAGNOSIS — N183 Chronic kidney disease, stage 3 (moderate): Secondary | ICD-10-CM | POA: Diagnosis not present

## 2018-03-28 DIAGNOSIS — E1142 Type 2 diabetes mellitus with diabetic polyneuropathy: Secondary | ICD-10-CM | POA: Diagnosis not present

## 2018-03-28 DIAGNOSIS — I152 Hypertension secondary to endocrine disorders: Secondary | ICD-10-CM | POA: Diagnosis not present

## 2018-03-30 DIAGNOSIS — G894 Chronic pain syndrome: Secondary | ICD-10-CM | POA: Diagnosis not present

## 2018-03-30 DIAGNOSIS — M79672 Pain in left foot: Secondary | ICD-10-CM | POA: Diagnosis not present

## 2018-03-30 DIAGNOSIS — Z79891 Long term (current) use of opiate analgesic: Secondary | ICD-10-CM | POA: Diagnosis not present

## 2018-03-30 DIAGNOSIS — M545 Low back pain: Secondary | ICD-10-CM | POA: Diagnosis not present

## 2018-03-30 NOTE — Telephone Encounter (Signed)
Review visits/ meds

## 2018-03-31 DIAGNOSIS — I5032 Chronic diastolic (congestive) heart failure: Secondary | ICD-10-CM | POA: Diagnosis not present

## 2018-03-31 DIAGNOSIS — I152 Hypertension secondary to endocrine disorders: Secondary | ICD-10-CM | POA: Diagnosis not present

## 2018-03-31 DIAGNOSIS — I480 Paroxysmal atrial fibrillation: Secondary | ICD-10-CM | POA: Diagnosis not present

## 2018-03-31 DIAGNOSIS — E1142 Type 2 diabetes mellitus with diabetic polyneuropathy: Secondary | ICD-10-CM | POA: Diagnosis not present

## 2018-04-01 ENCOUNTER — Ambulatory Visit: Payer: Medicare Other | Admitting: Dietician

## 2018-04-04 DIAGNOSIS — N183 Chronic kidney disease, stage 3 (moderate): Secondary | ICD-10-CM | POA: Diagnosis not present

## 2018-04-04 DIAGNOSIS — E1142 Type 2 diabetes mellitus with diabetic polyneuropathy: Secondary | ICD-10-CM | POA: Diagnosis not present

## 2018-04-04 DIAGNOSIS — I5032 Chronic diastolic (congestive) heart failure: Secondary | ICD-10-CM | POA: Diagnosis not present

## 2018-04-04 DIAGNOSIS — I152 Hypertension secondary to endocrine disorders: Secondary | ICD-10-CM | POA: Diagnosis not present

## 2018-04-15 ENCOUNTER — Ambulatory Visit: Payer: Medicare Other | Admitting: Dietician

## 2018-04-17 ENCOUNTER — Other Ambulatory Visit: Payer: Self-pay | Admitting: Endocrinology

## 2018-04-21 DIAGNOSIS — E113311 Type 2 diabetes mellitus with moderate nonproliferative diabetic retinopathy with macular edema, right eye: Secondary | ICD-10-CM | POA: Diagnosis not present

## 2018-04-26 ENCOUNTER — Ambulatory Visit (INDEPENDENT_AMBULATORY_CARE_PROVIDER_SITE_OTHER): Payer: Medicare Other | Admitting: Internal Medicine

## 2018-04-26 ENCOUNTER — Ambulatory Visit (HOSPITAL_COMMUNITY)
Admission: RE | Admit: 2018-04-26 | Discharge: 2018-04-26 | Disposition: A | Discharge status: Home / Self Care | Payer: Medicare Other | Source: Ambulatory Visit | Attending: Internal Medicine | Admitting: Internal Medicine

## 2018-04-26 ENCOUNTER — Other Ambulatory Visit: Payer: Self-pay

## 2018-04-26 ENCOUNTER — Other Ambulatory Visit: Payer: Self-pay | Admitting: Endocrinology

## 2018-04-26 VITALS — BP 126/69 | HR 66 | Temp 98.0°F | Ht 76.0 in | Wt 386.4 lb

## 2018-04-26 DIAGNOSIS — N183 Chronic kidney disease, stage 3 unspecified: Secondary | ICD-10-CM

## 2018-04-26 DIAGNOSIS — R110 Nausea: Secondary | ICD-10-CM

## 2018-04-26 DIAGNOSIS — G894 Chronic pain syndrome: Secondary | ICD-10-CM

## 2018-04-26 DIAGNOSIS — Z23 Encounter for immunization: Secondary | ICD-10-CM | POA: Diagnosis not present

## 2018-04-26 DIAGNOSIS — R0781 Pleurodynia: Secondary | ICD-10-CM | POA: Diagnosis not present

## 2018-04-26 DIAGNOSIS — E11649 Type 2 diabetes mellitus with hypoglycemia without coma: Secondary | ICD-10-CM

## 2018-04-26 DIAGNOSIS — I10 Essential (primary) hypertension: Secondary | ICD-10-CM

## 2018-04-26 DIAGNOSIS — L97529 Non-pressure chronic ulcer of other part of left foot with unspecified severity: Secondary | ICD-10-CM

## 2018-04-26 DIAGNOSIS — I480 Paroxysmal atrial fibrillation: Secondary | ICD-10-CM | POA: Diagnosis not present

## 2018-04-26 DIAGNOSIS — Z89422 Acquired absence of other left toe(s): Secondary | ICD-10-CM

## 2018-04-26 DIAGNOSIS — Z7901 Long term (current) use of anticoagulants: Secondary | ICD-10-CM

## 2018-04-26 DIAGNOSIS — I129 Hypertensive chronic kidney disease with stage 1 through stage 4 chronic kidney disease, or unspecified chronic kidney disease: Secondary | ICD-10-CM

## 2018-04-26 DIAGNOSIS — R079 Chest pain, unspecified: Secondary | ICD-10-CM | POA: Diagnosis not present

## 2018-04-26 DIAGNOSIS — Z79899 Other long term (current) drug therapy: Secondary | ICD-10-CM

## 2018-04-26 DIAGNOSIS — E1142 Type 2 diabetes mellitus with diabetic polyneuropathy: Secondary | ICD-10-CM | POA: Diagnosis not present

## 2018-04-26 DIAGNOSIS — W19XXXA Unspecified fall, initial encounter: Secondary | ICD-10-CM | POA: Diagnosis not present

## 2018-04-26 DIAGNOSIS — Z9641 Presence of insulin pump (external) (internal): Secondary | ICD-10-CM

## 2018-04-26 DIAGNOSIS — Z6841 Body Mass Index (BMI) 40.0 and over, adult: Secondary | ICD-10-CM

## 2018-04-26 DIAGNOSIS — Z9181 History of falling: Secondary | ICD-10-CM

## 2018-04-26 DIAGNOSIS — E1122 Type 2 diabetes mellitus with diabetic chronic kidney disease: Secondary | ICD-10-CM

## 2018-04-26 DIAGNOSIS — Z0000 Encounter for general adult medical examination without abnormal findings: Secondary | ICD-10-CM

## 2018-04-26 DIAGNOSIS — Z8631 Personal history of diabetic foot ulcer: Secondary | ICD-10-CM

## 2018-04-26 DIAGNOSIS — S299XXA Unspecified injury of thorax, initial encounter: Secondary | ICD-10-CM | POA: Diagnosis not present

## 2018-04-26 NOTE — Assessment & Plan Note (Signed)
-  This problem is chronic and stable -Patient was on gabapentin prior to his last hospitalization but this was stopped during his hospitalization.  The reason for stopping this remains unclear at this time. -Patient states that his peripheral neuropathy is well controlled currently.  We will hold gabapentin for now. -If his symptoms worsen would consider resuming gabapentin

## 2018-04-26 NOTE — Assessment & Plan Note (Signed)
-  This problem is chronic and stable -Patient's BMI is still 47 but he is down 20 pounds since his admission in September -He states that he did not like the food at the rehab center and did not eat much during that time -I encouraged the patient to watch his diet and continue with exercise -He will continue to try and lose weight.  We will follow-up on his next visit

## 2018-04-26 NOTE — Progress Notes (Signed)
   Subjective:    Patient ID: [name], male    DOB: 1951/05/05, 67 y.o.   MRN: [id number]  HPI  I have seen and examined this patient.  Patient is here for routine follow-up of his hypertension and diabetes.  Patient also complains of a recent fall over the weekend where he landed on his back and is complaining of pain over his left upper back.  Patient is also recently admitted to the hospital last month for osteomyelitis in his left foot status post left transmetatarsal amputation on September 9.  Patient is completed course of antibiotics for this and is following up with the orthopedic surgeon.  He states that the pain has improved in his left foot.  He denies any other complaints at this time and states that he feels well otherwise.  He is compliant with all his medications.   Review of Systems  Constitutional: Negative.   HENT: Negative.   Respiratory: Negative.   Cardiovascular: Negative.   Gastrointestinal: Negative.   Musculoskeletal: Positive for back pain.  Neurological: Negative.   Psychiatric/Behavioral: Negative.        Objective:   Physical Exam  Constitutional: He is oriented to person, place, and time. He appears well-developed and well-nourished.  HENT:  Head: Normocephalic and atraumatic.  Mouth/Throat: Oropharynx is clear and moist. No oropharyngeal exudate.  Neck: Neck supple.  Cardiovascular: Normal rate, regular rhythm and normal heart sounds.  Pulmonary/Chest: Effort normal and breath sounds normal. He has no wheezes. He has no rales.  Abdominal: Soft. Bowel sounds are normal. He exhibits no distension. There is no tenderness.  Musculoskeletal: He exhibits no edema.  Left lower extremity dressing intact.  Patient has a walking boot over his left lower extremity.  Lymphadenopathy:    He has no cervical adenopathy.  Neurological: He is alert and oriented to person, place, and time.  Psychiatric: He has a normal mood and affect. His behavior is  normal.          Assessment & Plan:  Please see problem based charting for assessment and plan:

## 2018-04-26 NOTE — Assessment & Plan Note (Signed)
-  This problem is chronic and stable -Patient follows up with Dr. Dwyane Dee for his diabetes -He has an appointment with him next month -We will continue with his current diabetic regimen including Victoza 1.8 mg, insulin via his V-go pump -His last A1c was 8 -His blood glucose readings were reviewed and it showed that he was in target range 62% of the time and above range 35% of the time and below range 3% of the time (no hypoglycemic episodes below 54) -No further work-up at this time

## 2018-04-26 NOTE — Assessment & Plan Note (Signed)
-  This problem is chronic and stable -Patient's baseline creatinine is between 1.4 and 1.6 -On admission in September his creatinine had worsened 1.9 and his ACE inhibitor and Lasix were held -On discharge his creatinine was at his baseline of 1.4 -We will recheck this on his follow-up appointment -His creatinine remained stable would restart his ACE inhibitor and consider resuming his Lasix if he has signs of fluid overload -No further work-up for now

## 2018-04-26 NOTE — Patient Instructions (Addendum)
-  It was a pleasure seeing you today -We will check an x-ray of your ribs on the left side given the pain after fall -Blood pressure well controlled.  Keep up the great work -Your blood sugars look like they are doing better.  Please follow-up with Dr. Dwyane Dee -Please follow-up with the pain clinic tomorrow and consider asking them for muscle relaxer for your back pain -Congratulations on the weight loss.  Keep up the great work! -Have a great holiday season!

## 2018-04-26 NOTE — Assessment & Plan Note (Signed)
-  This problem is chronic and stable -Patient has been on Phenergan chronically for this.  At his last visit we talked about using this medication only as needed and not as a daily medication -Patient states that he uses this medication only sparingly now and that he has not had many episodes of nausea -No further work-up at this time. -We will continue to monitor him closely

## 2018-04-26 NOTE — Assessment & Plan Note (Addendum)
-  This problem is chronic and stable -Patient's blood pressure is well controlled today -We will continue with metoprolol 25 mg daily, amlodipine 10 mg -Lasix was stopped on his last admission.  He currently appears to be euvolemic.  We will continue to monitor closely -His ACE inhibitor was stopped on his last admission as well.  Will resume this at his follow-up visit if his creatinine remains stable. -No further work-up at this time

## 2018-04-26 NOTE — Assessment & Plan Note (Signed)
-  This problem is chronic and stable -Patient is compliant with metoprolol and Eliquis -On exam today his heart rate was in the 60s and he appeared to be in a regular rhythm -No further work-up at this time -We will continue with his current medications

## 2018-04-26 NOTE — Assessment & Plan Note (Signed)
-  Patient is admitted to the hospital in September for osteomyelitis in his left foot and is now status post left transmetatarsal amputation.  He finished his course of antibiotics and is following with orthopedics as an outpatient for this -Patient states that on his last visit he was told that the wound is healing well -He denies any fevers or chills and states that the pain in his left foot has improved -We will continue to monitor him closely. -He had a foot exam today and was given instructions on proper foot care

## 2018-04-26 NOTE — Assessment & Plan Note (Signed)
Flu shot given today

## 2018-04-26 NOTE — Assessment & Plan Note (Signed)
-  Patient is status post mechanical fall over the weekend -He states that he was leaning up against a banister and it broke and he fell onto his back -He states that he has had pain on movement and deep breathing since then over the left side of his upper back -We will obtain a chest x-ray today to look for a possible broken rib but on exam he appears to have tense muscles over the area and his pain is likely secondary to muscle spasms -He is scheduled to follow-up with his pain clinic tomorrow.  I encouraged him to bring up the increased pain with them and consider adding a muscle relaxer to his regimen -No further work-up at this time

## 2018-04-27 ENCOUNTER — Telehealth: Payer: Self-pay | Admitting: Internal Medicine

## 2018-04-27 DIAGNOSIS — Z79891 Long term (current) use of opiate analgesic: Secondary | ICD-10-CM | POA: Diagnosis not present

## 2018-04-27 DIAGNOSIS — M545 Low back pain: Secondary | ICD-10-CM | POA: Diagnosis not present

## 2018-04-27 DIAGNOSIS — M79672 Pain in left foot: Secondary | ICD-10-CM | POA: Diagnosis not present

## 2018-04-27 DIAGNOSIS — G894 Chronic pain syndrome: Secondary | ICD-10-CM | POA: Diagnosis not present

## 2018-04-27 NOTE — Telephone Encounter (Signed)
I called the patient to discuss the results of his x-ray with him.  Patient had an x-ray of his left ribs taken yesterday given his recent fall and pain over the posterior aspect of his upper left chest.  The x-ray showed no acute bony abnormalities.  Patient is expected to follow-up with his pain clinic today and will likely receive muscle relaxers for this.  No further work-up at this time.  Patient expresses understanding and is in agreement with plan.

## 2018-05-05 DIAGNOSIS — M24572 Contracture, left ankle: Secondary | ICD-10-CM | POA: Diagnosis not present

## 2018-05-06 DIAGNOSIS — E113512 Type 2 diabetes mellitus with proliferative diabetic retinopathy with macular edema, left eye: Secondary | ICD-10-CM | POA: Diagnosis not present

## 2018-05-09 DIAGNOSIS — H1013 Acute atopic conjunctivitis, bilateral: Secondary | ICD-10-CM | POA: Diagnosis not present

## 2018-05-11 ENCOUNTER — Telehealth: Payer: Self-pay | Admitting: *Deleted

## 2018-05-11 NOTE — Telephone Encounter (Signed)
Let me know when it comes

## 2018-05-11 NOTE — Telephone Encounter (Signed)
Call from patient to inform us that forms were coming for shoes.  Needs to have Dr. Dareen Piano to sign. Spoke to Mae at front office to look for forms and to get to Dr. Dareen Piano.  [name], RN 05/11/2018 3:19 PM.

## 2018-05-13 ENCOUNTER — Other Ambulatory Visit (INDEPENDENT_AMBULATORY_CARE_PROVIDER_SITE_OTHER): Payer: Medicare Other

## 2018-05-13 DIAGNOSIS — E1165 Type 2 diabetes mellitus with hyperglycemia: Secondary | ICD-10-CM

## 2018-05-13 DIAGNOSIS — E7800 Pure hypercholesterolemia, unspecified: Secondary | ICD-10-CM

## 2018-05-13 DIAGNOSIS — Z794 Long term (current) use of insulin: Secondary | ICD-10-CM | POA: Diagnosis not present

## 2018-05-13 LAB — COMPREHENSIVE METABOLIC PANEL
ALK PHOS: 125 U/L — AB (ref 39–117)
ALT: 12 U/L (ref 0–53)
AST: 14 U/L (ref 0–37)
Albumin: 3.9 g/dL (ref 3.5–5.2)
BILIRUBIN TOTAL: 0.5 mg/dL (ref 0.2–1.2)
BUN: 29 mg/dL — AB (ref 6–23)
CO2: 26 mEq/L (ref 19–32)
Calcium: 9.5 mg/dL (ref 8.4–10.5)
Chloride: 103 mEq/L (ref 96–112)
Creatinine, Ser: 1.95 mg/dL — ABNORMAL HIGH (ref 0.40–1.50)
GFR: 44.19 mL/min — ABNORMAL LOW (ref >60.00)
GLUCOSE: 143 mg/dL — AB (ref 70–99)
Potassium: 3.9 mEq/L (ref 3.5–5.1)
SODIUM: 138 meq/L (ref 135–145)
TOTAL PROTEIN: 7.7 g/dL (ref 6.0–8.3)

## 2018-05-13 LAB — LIPID PANEL
Cholesterol: 114 mg/dL (ref 0–200)
HDL: 29 mg/dL — ABNORMAL LOW (ref >39.00)
LDL Cholesterol: 55 mg/dL (ref 0–99)
NONHDL: 84.52
Total CHOL/HDL Ratio: 4
Triglycerides: 146 mg/dL (ref 0.0–149.0)
VLDL: 29.2 mg/dL (ref 0.0–40.0)

## 2018-05-13 LAB — HEMOGLOBIN A1C: Hgb A1c MFr Bld: 8.3 % — ABNORMAL HIGH (ref 4.6–6.5)

## 2018-05-16 ENCOUNTER — Ambulatory Visit (INDEPENDENT_AMBULATORY_CARE_PROVIDER_SITE_OTHER): Payer: Medicare Other | Admitting: Endocrinology

## 2018-05-16 ENCOUNTER — Encounter: Payer: Self-pay | Admitting: Endocrinology

## 2018-05-16 VITALS — BP 128/74 | HR 68 | Ht 76.0 in | Wt 389.0 lb

## 2018-05-16 DIAGNOSIS — E1165 Type 2 diabetes mellitus with hyperglycemia: Secondary | ICD-10-CM | POA: Diagnosis not present

## 2018-05-16 DIAGNOSIS — N289 Disorder of kidney and ureter, unspecified: Secondary | ICD-10-CM

## 2018-05-16 DIAGNOSIS — Z794 Long term (current) use of insulin: Secondary | ICD-10-CM

## 2018-05-16 NOTE — Progress Notes (Signed)
Patient ID: [name], male   DOB: 1951/07/03, 67 y.o.   MRN: [id number]           Reason for Appointment: Follow-up for Type 2 Diabetes  Referring physician: Dareen Piano  History of Present Illness:          Date of diagnosis of type 2 diabetes mellitus: 2007       Background history:   He was diagnosed to have diabetes when he had an ulcer on his left third toe which led to amputation.  His glucose was apparently about 400 and he was started on insulin at that time He does not know if he has taken any oral hypoglycemic drugs in the past His A1c in 2016 has been consistently over 8% On his initial consultation was started on Victoza and subsequently started on U-500 insulin in 2/17 along with Toujeo INSULIN regimen previously was:  TOUJEO  74 in the morning and 74 in the evening HUMULIN U-500 insulin: 20/40 units in the morning, 60/70 at lunch and 70 at supper  Recent history:   Since 8/18 has been using V-go PUMP 20 unit basal, Humulin R U-500 insulin with boluses 4 units breakfast, 10 units acl  10-12 units Changes his pump at 8 am  Non-insulin hypoglycemic drugs the patient is taking are: Victoza 1.8 mg daily        His A1c is now 8.3, previously 8.5  Current blood sugar patterns and problems identified:  He still has difficulty with controlling postprandial blood sugar  As before his highest blood sugars are after dinner in the evening  Blood sugar may also be higher after lunch based on what he is eating or with snacks in the afternoon  Even with talking to the dietitian he is not able to be consistent with his diet and food content is variable  BOLUSES at mealtimes are irregular and he may sometimes not bolus till after eating, once this has caused relatively low sugar late night  Yesterday because of snacks his blood sugars were high most of the afternoon and evening  Also he thinks that he does not take more than 3 or 4 clicks for his meal if the blood  sugar is near normal and does not adjusted based on what he is eating  Hypoglycemia has been minimal with only occasional transient low sugars only morning, lowest reading 56 possibly related to the long action of food bolus at night  On an average his blood sugars are similar to the last visit   Glucose monitoring:  done 3-4  times a day         Glucometer:  freestyle libre 14-day     Blood Glucose readings by download:   CGM use % of time  97  Average and SD  153+/-55  Time in range      71 %  % Time Above 180  27, was 31  % Time above 250  6  % Time Below target  2       PRE-MEAL Fasting Lunch Dinner Bedtime Overall  Glucose range:       Mean/median:  119  130  171  173 153   POST-MEAL PC Breakfast PC Lunch PC Dinner  Glucose range:     Mean/median:   171  198      Self-care:  Typical meal intake: Breakfast is sometimes fruit or cereal, Sometimes eating sausage and grits Has been trying to avoid high-fat foods or fried food  but not daily  Breakfast 8 AM Lunch 12 noon Dinner at 5-6 pm He drinks cranberry or V-8 juice and diet drinks                Dietician consultation: 1/19               Exercise: not walking,  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 Cholesterol 05/13/2018 114  0 - 200 mg/dL Final   ATP III Classification       Desirable:  < 200 mg/dL               Borderline  High:  200 - 239 mg/dL          High:  > = 240 mg/dL  . Triglycerides 05/13/2018 146.0  0.0 - 149.0 mg/dL Final   Normal:  <150 mg/dLBorderline High:  150 - 199 mg/dL  . HDL 05/13/2018 29.00* >39.00 mg/dL Final  . VLDL 05/13/2018 29.2  0.0 - 40.0 mg/dL Final  . LDL Cholesterol 05/13/2018 55  0 - 99 mg/dL Final  . Total CHOL/HDL Ratio 05/13/2018 4   Final                  Men          Women1/2 Average Risk     3.4          3.3Average Risk          5.0          4.42X Average Risk          9.6          7.13X Average Risk          15.0          11.0                      . NonHDL 05/13/2018 84.52   Final   NOTE:  Non-HDL goal should be 30 mg/dL higher than patient's LDL goal (i.e. LDL goal of < 70 mg/dL, would have non-HDL goal of < 100 mg/dL)  . Sodium 05/13/2018 138  135 - 145 mEq/L Final  . Potassium 05/13/2018 3.9  3.5 - 5.1 mEq/L Final  . Chloride 05/13/2018 103  96 - 112 mEq/L Final  . CO2 05/13/2018 26  19 - 32 mEq/L Final  . Glucose, Bld 05/13/2018 143* 70 - 99 mg/dL Final  . BUN 05/13/2018 29* 6 - 23 mg/dL Final  . Creatinine, Ser 05/13/2018 1.95* 0.40 - 1.50 mg/dL Final  . Total Bilirubin 05/13/2018 0.5  0.2 - 1.2 mg/dL Final  . Alkaline Phosphatase 05/13/2018 125* 39 - 117 U/L Final  . AST 05/13/2018 14  0 - 37 U/L Final  . ALT 05/13/2018 12  0 -  53 U/L Final  . Total Protein 05/13/2018 7.7  6.0 - 8.3 g/dL Final  . Albumin 05/13/2018 3.9  3.5 - 5.2 g/dL Final  . Calcium 05/13/2018 9.5  8.4 - 10.5 mg/dL Final  . GFR 05/13/2018 44.19* >60.00 mL/min Final  . Hgb A1c MFr Bld 05/13/2018 8.3* 4.6 - 6.5 % Final   Glycemic Control Guidelines for People with Diabetes:Non Diabetic:  <6%Goal of Therapy: <7%Additional Action Suggested:  >8%        Allergies as of 05/16/2018      Reactions   Vancomycin    REACTION: ARF      Medication List        Accurate as of 05/16/18 12:10 PM. Always use your most recent med list.          ACCU-CHEK AVIVA PLUS test strip Generic drug:   glucose blood USE TO CHECK BLOOD FIVE TIMES DAILY   ACCU-CHEK AVIVA PLUS w/Device Kit CHECK BLOOD SUGAR 5 TIMES A DAY   ACCU-CHEK FASTCLIX LANCETS Misc Check blood sugar 5 times a day   albuterol 108 (90 Base) MCG/ACT inhaler Commonly known as:  PROVENTIL HFA;VENTOLIN HFA Inhale 2 puffs into the lungs every 6 (six) hours as needed for wheezing or shortness of breath.   amLODipine 10 MG tablet Commonly known as:  NORVASC Take 1 tablet (10 mg total) by mouth daily.   apixaban 5 MG Tabs tablet Commonly known as:  ELIQUIS Take 1 tablet (5 mg total) by mouth 2 (two) times daily.   atorvastatin 20 MG tablet Commonly known as:  LIPITOR Take 1 tablet (20 mg total) by mouth daily at 6 PM.   AZOPT 1 % ophthalmic suspension Generic drug:  brinzolamide Place 1 drop into both eyes 2 (two) times daily.   becaplermin 0.01 % gel Commonly known as:  REGRANEX Apply 1 application topically daily.   cholecalciferol 1000 units tablet Commonly known as:  VITAMIN D Take 2 tablets (2,000 Units total) by mouth daily.   diclofenac sodium 1 % Gel Commonly known as:  VOLTAREN Apply 4 g topically 4 (four) times daily.   docusate sodium 100 MG capsule Commonly known as:  COLACE Take 1 capsule (100 mg total) by mouth daily as needed for mild constipation.   folic acid 1 MG tablet Commonly known as:  FOLVITE Take 1 tablet (1 mg total) by mouth daily.   FREESTYLE LIBRE 14 DAY SENSOR Misc 1 each by Does not apply route 6 (six) times daily.   HUMULIN R 500 UNIT/ML injection Generic drug:  insulin regular human CONCENTRATED DRAW INSULIN TO THE 68 UNIT LINE ON THE U-100 SYRINGE(TO USE 340 UNITS) PER DAY AS DIRECTED   HYDROcodone-acetaminophen 7.5-325 MG tablet Commonly known as:  NORCO Take 1 tablet by mouth every 8 (eight) hours as needed for moderate pain or severe pain.   insulin lispro 100 UNIT/ML injection Commonly known as:  HUMALOG Inject 0.1 mLs (10 Units total) into the skin 3  (three) times daily with meals.   latanoprost 0.005 % ophthalmic solution Commonly known as:  XALATAN Place 1 drop into both eyes at bedtime.   metoprolol succinate 25 MG 24 hr tablet Commonly known as:  TOPROL-XL Take 1 tablet (25 mg total) by mouth daily at 6 PM.   multivitamin with minerals Tabs tablet Take 1 tablet by mouth daily.   polyethylene glycol packet Commonly known as:  MIRALAX / GLYCOLAX Take 17 g by mouth daily.   promethazine 25 MG tablet Commonly known  as:  PHENERGAN TAKE 1 TABLET BY MOUTH  EVERY 8 HOURS AS NEEDED FOR NAUSEA OR VOMITING   silver sulfADIAZINE 1 % cream Commonly known as:  SILVADENE Apply 1 application topically daily.   SIMBRINZA 1-0.2 % Susp Generic drug:  Brinzolamide-Brimonidine Place 1 drop into both eyes 2 (two) times daily.   thiamine 100 MG tablet Take 1 tablet (100 mg total) by mouth daily.   V-GO 20 Kit 1 application by Continuous infusion (non-IV) route daily. USE 1 PUMP DAILY WITH INSULIN   VICTOZA 18 MG/3ML Sopn Generic drug:  liraglutide INJECT 1.8 MG ONCE DAILY AT THE SAME TIME       Allergies:  Allergies  Allergen Reactions  . Vancomycin     REACTION: ARF    Past Medical History:  Diagnosis Date  . Arthritis    "elbows & knees" (12/13/2014)  . Asthma   . CKD (chronic kidney disease), stage III ([place])   . DIABETIC FOOT ULCER 06/20/2009  . Edema, macular, due to secondary diabetes ([place])   . Erectile dysfunction   . GERD (gastroesophageal reflux disease)   . HEARING LOSS, SENSORINEURAL, BILATERAL 01/03/2007   Seen by ENT Dr. Orpah Greek D. Redmond Baseman 01/03/07  . Hemorrhoids   . History of echocardiogram    a. 04/2008 Echo: EF 50-55%, abnl LV relaxation, mildly dil LA.  Marland Kitchen Hyperlipidemia   . Hypertension   . Morbid obesity ([place])   . Neuropathy, lower extremity   . OSA (obstructive sleep apnea)    uses CPAP nightly  . OSA on CPAP    Nocturnal polysomnogram on 01/21/2010 showed severe obstructive sleep apnea/hypopnea  syndrome, AHI 74.1 per hour with non positional events, moderately loud snoring, and oxygen desaturation to a nadir of 78% on room air.  CPAP was successfully titrated to 17 CWP, AHI 1.1 per hour using a large ResMed Mirage Quattro full-face mask with heated humidifier. Bruxism was noted.   . Osteomyelitis of ankle and foot ([place])   . Retinopathy   . Type II diabetes mellitus (HCC)    w/complication NOS, type II    Past Surgical History:  Procedure Laterality Date  . ACHILLES TENDON LENGTHENING Left 03/21/2018   Procedure: ACHILLES TENDON LENGTHENING;  Surgeon: Erle Crocker, MD;  Location: [facility];  Service: Orthopedics;  Laterality: Left;  . AMPUTATION Left 12/15/2014   Procedure: LEFT SECOND TOE AMPUTATION ;  Surgeon: Dorna Leitz, MD;  Location: [facility];  Service: Orthopedics;  Laterality: Left;  . AMPUTATION Left 03/21/2018   Procedure: TRANSMETATARSAL AMPUTATION LEFT FOOT;  Surgeon: Erle Crocker, MD;  Location: [facility];  Service: Orthopedics;  Laterality: Left;  Popliteal & ankle block  . 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 Kathalene Frames. Mayer Camel.  . WOUND DEBRIDEMENT Left 06/15/2016   Procedure: DEBRIDEMENT WOUND LEFT FOOT WITH GRAFT APPLICATION;  Surgeon: Landis Martins, DPM;  Location: [facility];  Service: Podiatry;  Laterality: Left;    Family History  Problem Relation Age of Onset  . Diabetes Mother        also 2 siblings  . Heart attack Father 72  . Throat cancer Brother     Social History:  reports that he quit smoking about 8 years ago. His smoking use included cigarettes. He has a 15.00 pack-year smoking history. He has never used smokeless tobacco. He reports that he drinks about 11.0 standard drinks of alcohol per week. He reports  that he does not use drugs.    Review of Systems     Lipid management: taking Lipitor 20 mg, followed by PCP Also has low HDL    Lab  Results  Component Value Date   CHOL 114 05/13/2018   HDL 29.00 (L) 05/13/2018   LDLCALC 55 05/13/2018   TRIG 146.0 05/13/2018   CHOLHDL 4 05/13/2018            Hypertension: Has had hypertension for a few years treated with enalapril,  taking 20 mg although this is not on his list  Also on Lasix and 25 mg metoprolol, he does not know if he is taking amlodipine which is a new medication  This is treated by PCP/cardiologist   Standing blood pressure was relatively lower  BP Readings from Last 3 Encounters:  05/16/18 126/84  04/26/18 126/69  03/24/18 138/78    He  has chronic renal dysfunction, not clear why creatinine is significantly higher now Not followed by nephrologist  Lab Results  Component Value Date   CREATININE 1.95 (H) 05/13/2018   CREATININE 1.49 (H) 03/24/2018   CREATININE 1.62 (H) 03/23/2018   CREATININE 1.61 (H) 03/23/2018    He had osteomyelitis and left toe amputation was done in September   Physical Examination:  BP 126/84   Pulse 68   Ht '6\' 4"'$  (1.93 m)   Wt (!) 389 lb (176.4 kg)   SpO2 95%   BMI 47.35 kg/m      ASSESSMENT:  Diabetes type 2, with morbid obesity   See history of present illness for detailed discussion of current diabetes management, blood sugar patterns and problems identified  With V-go pump and using U-500 insulin his blood sugars are reasonably well controlled  However A1c is still around 8% although blood sugars are lower and averaging only 153  As before his postprandial readings are frequently high This is dependent on his diet and compliance with boluses Day-to-day management of pump, boluses, diet, blood sugar targets discussed in detail  LIPIDS: Well controlled  Renal dysfunction with minimal proteinuria: He needs to be followed up by his PCP and consider nephrology consultation    PLAN:     Better compliance with meal planning and avoiding excess carbohydrates, high fat meals and snacks  To try and  bolus consistently before each meal and preferably 30 minutes before eating  Also needs to try and avoid taking the full dose of the bolus if late for his mealtime clicks  Discussed that if he is having near normal blood sugars he still needs to bolus the full amount to cover his meal based on what he is eating  No change in basal rate  Check fructosamine on the next visit Consider follow-up with dietitian   There are no Patient Instructions on file for this visit.  Counseling time on subjects discussed in assessment and plan sections is over 50% of today's 25 minute visit   Elayne Snare 05/16/2018, 12:10 PM   Note: This office note was prepared with Dragon voice recognition system technology. Any transcriptional errors that result from this process are unintentional.

## 2018-05-19 ENCOUNTER — Other Ambulatory Visit: Payer: Self-pay | Admitting: Endocrinology

## 2018-05-19 ENCOUNTER — Other Ambulatory Visit: Payer: Self-pay | Admitting: Internal Medicine

## 2018-05-24 ENCOUNTER — Telehealth: Payer: Self-pay | Admitting: *Deleted

## 2018-05-24 NOTE — Telephone Encounter (Signed)
Thank you :)

## 2018-05-24 NOTE — Telephone Encounter (Signed)
-----   Message from Aldine Contes, MD sent at 05/23/2018 10:47 AM EST ----- Regarding: worsening creatinine Hello all, Patient needs to follow up in [facility] for worsening creatinine this week. He will need a repeat BMP as well as possible referral to nephrology.  Thank you, Dr. Dareen Piano    ----- Message ----- From: Elayne Snare, MD Sent: 05/16/2018  10:49 AM EST To: Aldine Contes, MD  Increasing creatinine

## 2018-05-24 NOTE — Telephone Encounter (Signed)
Placed call to patient. Friday is first availability patient has. Appt made in [facility] at 1015. Hubbard Hartshorn, RN, BSN

## 2018-05-25 DIAGNOSIS — E113311 Type 2 diabetes mellitus with moderate nonproliferative diabetic retinopathy with macular edema, right eye: Secondary | ICD-10-CM | POA: Diagnosis not present

## 2018-05-25 DIAGNOSIS — Z79891 Long term (current) use of opiate analgesic: Secondary | ICD-10-CM | POA: Diagnosis not present

## 2018-05-25 DIAGNOSIS — M79672 Pain in left foot: Secondary | ICD-10-CM | POA: Diagnosis not present

## 2018-05-25 DIAGNOSIS — G894 Chronic pain syndrome: Secondary | ICD-10-CM | POA: Diagnosis not present

## 2018-05-25 DIAGNOSIS — M545 Low back pain: Secondary | ICD-10-CM | POA: Diagnosis not present

## 2018-05-26 DIAGNOSIS — M24572 Contracture, left ankle: Secondary | ICD-10-CM | POA: Diagnosis not present

## 2018-05-27 ENCOUNTER — Encounter (INDEPENDENT_AMBULATORY_CARE_PROVIDER_SITE_OTHER): Payer: Self-pay

## 2018-05-27 ENCOUNTER — Encounter: Payer: Self-pay | Admitting: Internal Medicine

## 2018-05-27 ENCOUNTER — Ambulatory Visit (INDEPENDENT_AMBULATORY_CARE_PROVIDER_SITE_OTHER): Payer: Medicare Other | Admitting: Internal Medicine

## 2018-05-27 ENCOUNTER — Other Ambulatory Visit: Payer: Self-pay

## 2018-05-27 VITALS — BP 132/56 | HR 66 | Temp 97.9°F | Ht 78.0 in | Wt 385.1 lb

## 2018-05-27 DIAGNOSIS — E1122 Type 2 diabetes mellitus with diabetic chronic kidney disease: Secondary | ICD-10-CM

## 2018-05-27 DIAGNOSIS — K219 Gastro-esophageal reflux disease without esophagitis: Secondary | ICD-10-CM

## 2018-05-27 DIAGNOSIS — Z79899 Other long term (current) drug therapy: Secondary | ICD-10-CM

## 2018-05-27 DIAGNOSIS — F419 Anxiety disorder, unspecified: Secondary | ICD-10-CM

## 2018-05-27 DIAGNOSIS — E785 Hyperlipidemia, unspecified: Secondary | ICD-10-CM | POA: Diagnosis not present

## 2018-05-27 DIAGNOSIS — W19XXXD Unspecified fall, subsequent encounter: Secondary | ICD-10-CM

## 2018-05-27 DIAGNOSIS — N183 Chronic kidney disease, stage 3 unspecified: Secondary | ICD-10-CM

## 2018-05-27 DIAGNOSIS — Z89422 Acquired absence of other left toe(s): Secondary | ICD-10-CM

## 2018-05-27 DIAGNOSIS — Z794 Long term (current) use of insulin: Secondary | ICD-10-CM

## 2018-05-27 DIAGNOSIS — I10 Essential (primary) hypertension: Secondary | ICD-10-CM

## 2018-05-27 DIAGNOSIS — Z6841 Body Mass Index (BMI) 40.0 and over, adult: Secondary | ICD-10-CM

## 2018-05-27 DIAGNOSIS — I129 Hypertensive chronic kidney disease with stage 1 through stage 4 chronic kidney disease, or unspecified chronic kidney disease: Secondary | ICD-10-CM | POA: Diagnosis not present

## 2018-05-27 DIAGNOSIS — Z9181 History of falling: Secondary | ICD-10-CM

## 2018-05-27 DIAGNOSIS — F321 Major depressive disorder, single episode, moderate: Secondary | ICD-10-CM

## 2018-05-27 DIAGNOSIS — G4733 Obstructive sleep apnea (adult) (pediatric): Secondary | ICD-10-CM

## 2018-05-27 DIAGNOSIS — Z9989 Dependence on other enabling machines and devices: Secondary | ICD-10-CM

## 2018-05-27 MED ORDER — FUROSEMIDE 20 MG PO TABS: 20.0000 mg | ORAL_TABLET | Freq: Every day | ORAL | 2 refills | Status: DC

## 2018-05-27 MED ORDER — ESCITALOPRAM OXALATE 10 MG PO TABS: 10.0000 mg | ORAL_TABLET | Freq: Every day | ORAL | 2 refills | Status: DC

## 2018-05-27 NOTE — Assessment & Plan Note (Signed)
BP today is 132/56. Well-controlled on current regimen of metoprolol 25mg  daily, amlodipine 10mg  daily, enalapril 40 mg daily. Has also been taking lasix 40mg  daily, but will decrease this dose due to elevated Cr.   Plan 1. Continue metoprolol 25mg  daily, amlodipine 10mg  daily, enalapril 40 mg daily 2. Decrease lasix from 40mg  BID to 20mg  daily

## 2018-05-27 NOTE — Assessment & Plan Note (Signed)
PHQ-9 today is 24 without suicidality. [name] reports that he has felt down ever since his hospitalization and amputation. He has also had a lot of anxiety recently about his health, although he feels that he is in good hands with his team of doctors. He has never been on a medication for his mood before and would like to start one today.  Plan 1. Start Lexapro 10mg  daily 2. Follow-up at scheduled visit in February with PCP

## 2018-05-27 NOTE — Assessment & Plan Note (Signed)
The patient reports that his left-sided upper back pain has improved greatly since he was last seen. He continues to follow-up with pain management. No further intervention at this time.

## 2018-05-27 NOTE — Patient Instructions (Addendum)
It was a pleasure taking care of you today, Mr. Jablon!  1. Continue taking your enalapril  2. Decrease your lasix. Instead of taking 40mg  twice a day. Take 20mg  once a day. If your legs feel very swollen, you can take an additional 20mg  dose later in the day. I have sent a prescription into your pharmacy for a 20mg  tablet.  3. Start taking Lexapro 10mg  daily. This medication is for depression.  4. If Dr. Dareen Piano still wants you to have a nephrology referral, we will call you and let you know.  5. Follow-up with Dr. Dareen Piano in February as scheduled.  Feel free to call our clinic at [phone] if you have any questions.   Thanks, Dr. Annie Paras

## 2018-05-27 NOTE — Assessment & Plan Note (Signed)
Baseline Cr is 1.4-1.6. CMP on 05/13/18 showed Cr 1.95, BUN 29, GFR 44. During his recent hospital admission for left transmetatarsal amputation, he was found to have an AKI with a Cr of 1.9. His enalapril and lasix were held until hospital follow-up one month ago. Since then he has been taking enalapril 40mg  daily and lasix 40mg  BID. He reports that his legs sometimes swell up, but they return to normal after the lasix.   Assessment: The increase in Cr is within the expected range of increase after restarting his enalapril. However, his increased BUN points to dehydration as well. Therefore, will continue the enalapril and decrease his lasix dose to avoid dehydration.   Plan 1. Continue enalapril 40mg  daily 2. Decrease lasix from 40mg  BID to 20mg  QD. Advised the patient that he can take an extra 20mg  dose if he feels swollen.

## 2018-05-27 NOTE — Progress Notes (Signed)
   CC: elevated Cr  HPI:   [name] is a 67 y.o. male with a history of CKD, insulin-dependent DMII, GERD, [name], HTN, morbid obesity, OSA on CPAP, and left transmetatarsal amputation 03/2018 who presents to the internal medicine clinic for evaluation of an elevated Cr to 1.95 (baseline 1.4-1.6) on recent CMP. Please see problem based charting for the history and status of the patient's current and chronic medical conditions.   Past Medical History:  Diagnosis Date  . Arthritis    "elbows & knees" (12/13/2014)  . Asthma   . CKD (chronic kidney disease), stage III ([place])   . DIABETIC FOOT ULCER 06/20/2009  . Edema, macular, due to secondary diabetes ([place])   . Erectile dysfunction   . GERD (gastroesophageal reflux disease)   . HEARING LOSS, SENSORINEURAL, BILATERAL 01/03/2007   Seen by ENT Dr. Orpah Greek D. Redmond Baseman 01/03/07  . Hemorrhoids   . History of echocardiogram    a. 04/2008 Echo: EF 50-55%, abnl LV relaxation, mildly dil LA.  Marland Kitchen Hyperlipidemia   . Hypertension   . Morbid obesity ([place])   . Neuropathy, lower extremity   . OSA (obstructive sleep apnea)    uses CPAP nightly  . OSA on CPAP    Nocturnal polysomnogram on 01/21/2010 showed severe obstructive sleep apnea/hypopnea syndrome, AHI 74.1 per hour with non positional events, moderately loud snoring, and oxygen desaturation to a nadir of 78% on room air.  CPAP was successfully titrated to 17 CWP, AHI 1.1 per hour using a large ResMed Mirage Quattro full-face mask with heated humidifier. Bruxism was noted.   . Osteomyelitis of ankle and foot ([place])   . Retinopathy   . Type II diabetes mellitus (HCC)    w/complication NOS, type II    Review of Systems:   Pertinent positives mentioned in HPI. Remainder of all ROS negative.  Physical Exam: Vitals:   05/27/18 1000  BP: (!) 132/56  Pulse: 66  Temp: 97.9 F (36.6 C)  TempSrc: Oral  SpO2: 100%  Weight: (!) 385 lb 1.6 oz (174.7 kg)  Height: 6\' 6"  (1.981 m)   Physical Exam    Constitutional: Obese, no distress. Eyes: Pupils are equal, round, and reactive to light. EOM are normal.  Cardiovascular: Normal rate and regular rhythm. No murmurs, rubs, or gallops. Pulmonary/Chest: Effort normal. Clear to auscultation bilaterally. No wheezes, rales, or rhonchi. Abdominal: Bowel sounds present. Soft, non-distended, non-tender. No edema. Ext: Boot on left foot. Trace lower extremity edema L > R. Skin: Warm and dry. No rashes or wounds.   Assessment & Plan:   See Encounters Tab for problem based charting.  Patient seen with Dr. Daryll Drown

## 2018-05-28 DIAGNOSIS — H1013 Acute atopic conjunctivitis, bilateral: Secondary | ICD-10-CM | POA: Diagnosis not present

## 2018-06-01 NOTE — Progress Notes (Signed)
Internal Medicine Clinic Attending  I saw and evaluated the patient.  I personally confirmed the key portions of the history and exam documented by Dr. Annie Paras and I reviewed pertinent patient test results.  The assessment, diagnosis, and plan were formulated together and I agree with the documentation in the resident's note.   Reviewed labs and I think his bump in Cr is likely related to ACE-I dosing.  I discussed with Dr. Dareen Piano who was okay with delaying nephrology consult until patient is seen in clinic next time.

## 2018-06-06 DIAGNOSIS — Z89432 Acquired absence of left foot: Secondary | ICD-10-CM | POA: Diagnosis not present

## 2018-06-06 DIAGNOSIS — G4733 Obstructive sleep apnea (adult) (pediatric): Secondary | ICD-10-CM | POA: Diagnosis not present

## 2018-06-06 DIAGNOSIS — E1140 Type 2 diabetes mellitus with diabetic neuropathy, unspecified: Secondary | ICD-10-CM | POA: Diagnosis not present

## 2018-06-07 ENCOUNTER — Other Ambulatory Visit: Payer: Self-pay | Admitting: Internal Medicine

## 2018-06-07 DIAGNOSIS — IMO0002 Reserved for concepts with insufficient information to code with codable children: Secondary | ICD-10-CM

## 2018-06-07 DIAGNOSIS — E1122 Type 2 diabetes mellitus with diabetic chronic kidney disease: Secondary | ICD-10-CM

## 2018-06-07 DIAGNOSIS — Z794 Long term (current) use of insulin: Principal | ICD-10-CM

## 2018-06-07 DIAGNOSIS — N183 Chronic kidney disease, stage 3 (moderate): Principal | ICD-10-CM

## 2018-06-07 DIAGNOSIS — E1165 Type 2 diabetes mellitus with hyperglycemia: Principal | ICD-10-CM

## 2018-06-08 ENCOUNTER — Other Ambulatory Visit: Payer: Self-pay

## 2018-06-08 DIAGNOSIS — M24572 Contracture, left ankle: Secondary | ICD-10-CM | POA: Diagnosis not present

## 2018-06-08 DIAGNOSIS — E11649 Type 2 diabetes mellitus with hypoglycemia without coma: Secondary | ICD-10-CM

## 2018-06-08 MED ORDER — GLUCOSE BLOOD VI STRP: 1.0000 | ORAL_STRIP | 3 refills | Status: AC | PRN

## 2018-06-08 MED ORDER — FREESTYLE LIBRE 14 DAY SENSOR MISC: 1.0000 | Freq: Every day | 12 refills | Status: DC

## 2018-06-10 ENCOUNTER — Other Ambulatory Visit: Payer: Self-pay | Admitting: Endocrinology

## 2018-06-10 DIAGNOSIS — L97521 Non-pressure chronic ulcer of other part of left foot limited to breakdown of skin: Secondary | ICD-10-CM | POA: Diagnosis not present

## 2018-06-10 DIAGNOSIS — Z794 Long term (current) use of insulin: Secondary | ICD-10-CM | POA: Diagnosis not present

## 2018-06-10 DIAGNOSIS — E11621 Type 2 diabetes mellitus with foot ulcer: Secondary | ICD-10-CM | POA: Diagnosis not present

## 2018-06-10 DIAGNOSIS — E119 Type 2 diabetes mellitus without complications: Secondary | ICD-10-CM | POA: Diagnosis not present

## 2018-06-10 MED ORDER — ACCU-CHEK FASTCLIX LANCETS MISC: 3 refills | Status: AC

## 2018-06-10 MED ORDER — GLUCOSE BLOOD VI STRP: ORAL_STRIP | 1 refills | Status: AC

## 2018-06-10 MED ORDER — ACCU-CHEK AVIVA PLUS W/DEVICE KIT: PACK | 0 refills | Status: AC

## 2018-06-10 NOTE — Telephone Encounter (Signed)
Received a message to call back Mr. Vanhook. He is a type 2 diabetic who uses a freestyle Libre to monitor his BG. His CBG monitor is due to expired today at 5pm and he is calling asking for assistance on how to check his BG once the sensor expires. He called his endocrinologist's office yesterday and they refilled his sensor prescription. However, the pharmacy told him he needed to pay for them out of pocket, which he cannot afford. He called the endocrinology office today again, but has not received a call back yet. He used to have a BG Accu-check meter at home, but disposed of it when he started using the CGM monitor. I advise him to call his endocrinologist once again to see if they could assist him further. I also sent a prescription to his pharmacy for another BG meter + supplies (though he might have to pay out of pocket for these as well) and sent a message to our DM educator and Dr. Maudie Mercury.   Welford Roche, MD  Internal Medicine PGY-2  P [phone]

## 2018-06-13 ENCOUNTER — Telehealth: Payer: Self-pay | Admitting: Dietician

## 2018-06-13 NOTE — Telephone Encounter (Signed)
[name] had left a message about Freestyle Libre sensors. Today he reports that he received them from [facility] on Saturday and no longer needs assistance with this.

## 2018-06-18 ENCOUNTER — Other Ambulatory Visit: Payer: Self-pay | Admitting: Internal Medicine

## 2018-06-20 DIAGNOSIS — E113512 Type 2 diabetes mellitus with proliferative diabetic retinopathy with macular edema, left eye: Secondary | ICD-10-CM | POA: Diagnosis not present

## 2018-06-22 DIAGNOSIS — M545 Low back pain: Secondary | ICD-10-CM | POA: Diagnosis not present

## 2018-06-22 DIAGNOSIS — Z79891 Long term (current) use of opiate analgesic: Secondary | ICD-10-CM | POA: Diagnosis not present

## 2018-06-22 DIAGNOSIS — M79672 Pain in left foot: Secondary | ICD-10-CM | POA: Diagnosis not present

## 2018-06-22 DIAGNOSIS — G894 Chronic pain syndrome: Secondary | ICD-10-CM | POA: Diagnosis not present

## 2018-06-22 DIAGNOSIS — M24572 Contracture, left ankle: Secondary | ICD-10-CM | POA: Diagnosis not present

## 2018-06-23 ENCOUNTER — Other Ambulatory Visit: Payer: Self-pay

## 2018-06-23 MED ORDER — FUROSEMIDE 20 MG PO TABS: 20.0000 mg | ORAL_TABLET | Freq: Every day | ORAL | 1 refills | Status: DC

## 2018-06-23 NOTE — Telephone Encounter (Signed)
furosemide (LASIX) 20 MG tablet, refill request for 90 days supply @ [facility], [address] AT [address]

## 2018-06-23 NOTE — Telephone Encounter (Signed)
Next appt scheduled 08/23/18 with PCP. 

## 2018-06-24 ENCOUNTER — Telehealth: Payer: Self-pay | Admitting: Internal Medicine

## 2018-06-27 NOTE — Telephone Encounter (Signed)
Attempted to call pt to check in with him for meds, needed appts, no answer, lm for rtc. Teressa Senter. Task force

## 2018-06-28 DIAGNOSIS — E113513 Type 2 diabetes mellitus with proliferative diabetic retinopathy with macular edema, bilateral: Secondary | ICD-10-CM | POA: Diagnosis not present

## 2018-07-07 DIAGNOSIS — M24572 Contracture, left ankle: Secondary | ICD-10-CM | POA: Diagnosis not present

## 2018-07-10 DIAGNOSIS — Z794 Long term (current) use of insulin: Secondary | ICD-10-CM | POA: Diagnosis not present

## 2018-07-10 DIAGNOSIS — E119 Type 2 diabetes mellitus without complications: Secondary | ICD-10-CM | POA: Diagnosis not present

## 2018-07-10 DIAGNOSIS — E11621 Type 2 diabetes mellitus with foot ulcer: Secondary | ICD-10-CM | POA: Diagnosis not present

## 2018-07-10 DIAGNOSIS — L97521 Non-pressure chronic ulcer of other part of left foot limited to breakdown of skin: Secondary | ICD-10-CM | POA: Diagnosis not present

## 2018-07-12 ENCOUNTER — Other Ambulatory Visit: Payer: Self-pay | Admitting: Endocrinology

## 2018-07-12 DIAGNOSIS — G4733 Obstructive sleep apnea (adult) (pediatric): Secondary | ICD-10-CM | POA: Diagnosis not present

## 2018-07-14 ENCOUNTER — Other Ambulatory Visit: Payer: Self-pay | Admitting: Internal Medicine

## 2018-07-14 NOTE — Telephone Encounter (Signed)
Next appt scheduled 08/23/18 with PCP. 

## 2018-07-21 DIAGNOSIS — M24572 Contracture, left ankle: Secondary | ICD-10-CM | POA: Diagnosis not present

## 2018-07-25 DIAGNOSIS — G894 Chronic pain syndrome: Secondary | ICD-10-CM | POA: Diagnosis not present

## 2018-07-25 DIAGNOSIS — M79672 Pain in left foot: Secondary | ICD-10-CM | POA: Diagnosis not present

## 2018-07-25 DIAGNOSIS — M545 Low back pain: Secondary | ICD-10-CM | POA: Diagnosis not present

## 2018-07-25 DIAGNOSIS — Z79891 Long term (current) use of opiate analgesic: Secondary | ICD-10-CM | POA: Diagnosis not present

## 2018-08-01 DIAGNOSIS — E113513 Type 2 diabetes mellitus with proliferative diabetic retinopathy with macular edema, bilateral: Secondary | ICD-10-CM | POA: Diagnosis not present

## 2018-08-01 LAB — HM DIABETES EYE EXAM

## 2018-08-04 DIAGNOSIS — E1140 Type 2 diabetes mellitus with diabetic neuropathy, unspecified: Secondary | ICD-10-CM | POA: Diagnosis not present

## 2018-08-04 DIAGNOSIS — Z89432 Acquired absence of left foot: Secondary | ICD-10-CM | POA: Diagnosis not present

## 2018-08-09 ENCOUNTER — Other Ambulatory Visit: Payer: Self-pay | Admitting: Endocrinology

## 2018-08-10 DIAGNOSIS — Z794 Long term (current) use of insulin: Secondary | ICD-10-CM | POA: Diagnosis not present

## 2018-08-10 DIAGNOSIS — E119 Type 2 diabetes mellitus without complications: Secondary | ICD-10-CM | POA: Diagnosis not present

## 2018-08-10 DIAGNOSIS — L97521 Non-pressure chronic ulcer of other part of left foot limited to breakdown of skin: Secondary | ICD-10-CM | POA: Diagnosis not present

## 2018-08-10 DIAGNOSIS — E11621 Type 2 diabetes mellitus with foot ulcer: Secondary | ICD-10-CM | POA: Diagnosis not present

## 2018-08-12 ENCOUNTER — Other Ambulatory Visit (INDEPENDENT_AMBULATORY_CARE_PROVIDER_SITE_OTHER): Payer: Medicare Other

## 2018-08-12 DIAGNOSIS — Z794 Long term (current) use of insulin: Secondary | ICD-10-CM

## 2018-08-12 DIAGNOSIS — E1165 Type 2 diabetes mellitus with hyperglycemia: Secondary | ICD-10-CM | POA: Diagnosis not present

## 2018-08-12 LAB — BASIC METABOLIC PANEL
BUN: 21 mg/dL (ref 6–23)
CHLORIDE: 109 meq/L (ref 96–112)
CO2: 25 meq/L (ref 19–32)
Calcium: 9.5 mg/dL (ref 8.4–10.5)
Creatinine, Ser: 1.75 mg/dL — ABNORMAL HIGH (ref 0.40–1.50)
GFR: 47.07 mL/min — ABNORMAL LOW (ref >60.00)
GLUCOSE: 156 mg/dL — AB (ref 70–99)
POTASSIUM: 4.6 meq/L (ref 3.5–5.1)
SODIUM: 138 meq/L (ref 135–145)

## 2018-08-12 LAB — HEMOGLOBIN A1C: Hgb A1c MFr Bld: 9 % — ABNORMAL HIGH (ref 4.6–6.5)

## 2018-08-13 LAB — FRUCTOSAMINE: Fructosamine: 259 umol/L (ref 0–285)

## 2018-08-14 ENCOUNTER — Other Ambulatory Visit: Payer: Self-pay | Admitting: Internal Medicine

## 2018-08-14 ENCOUNTER — Other Ambulatory Visit: Payer: Self-pay | Admitting: Oncology

## 2018-08-14 ENCOUNTER — Other Ambulatory Visit: Payer: Self-pay | Admitting: Endocrinology

## 2018-08-18 ENCOUNTER — Encounter: Payer: Self-pay | Admitting: Endocrinology

## 2018-08-18 ENCOUNTER — Ambulatory Visit (INDEPENDENT_AMBULATORY_CARE_PROVIDER_SITE_OTHER): Payer: Medicare Other | Admitting: Endocrinology

## 2018-08-18 VITALS — BP 156/72 | HR 60 | Ht 78.0 in | Wt 397.0 lb

## 2018-08-18 DIAGNOSIS — E1129 Type 2 diabetes mellitus with other diabetic kidney complication: Secondary | ICD-10-CM | POA: Diagnosis not present

## 2018-08-18 DIAGNOSIS — E1142 Type 2 diabetes mellitus with diabetic polyneuropathy: Secondary | ICD-10-CM

## 2018-08-18 DIAGNOSIS — E1122 Type 2 diabetes mellitus with diabetic chronic kidney disease: Secondary | ICD-10-CM

## 2018-08-18 DIAGNOSIS — IMO0002 Reserved for concepts with insufficient information to code with codable children: Secondary | ICD-10-CM

## 2018-08-18 DIAGNOSIS — R809 Proteinuria, unspecified: Secondary | ICD-10-CM

## 2018-08-18 DIAGNOSIS — E1165 Type 2 diabetes mellitus with hyperglycemia: Secondary | ICD-10-CM | POA: Diagnosis not present

## 2018-08-18 DIAGNOSIS — N183 Chronic kidney disease, stage 3 (moderate): Secondary | ICD-10-CM

## 2018-08-18 DIAGNOSIS — Z794 Long term (current) use of insulin: Secondary | ICD-10-CM

## 2018-08-18 NOTE — Patient Instructions (Signed)
Click upto 30 min before meal based on amount of Starches and more if higher fat upto 4 clicks

## 2018-08-18 NOTE — Progress Notes (Signed)
Patient ID: [name], male   DOB: 01-Apr-1951, 68 y.o.   MRN: [id number]           Reason for Appointment: Follow-up for Type 2 Diabetes  Referring physician: Dareen Piano  History of Present Illness:          Date of diagnosis of type 2 diabetes mellitus: 2007       Background history:   He was diagnosed to have diabetes when he had an ulcer on his left third toe which led to amputation.  His glucose was apparently about 400 and he was started on insulin at that time He does not know if he has taken any oral hypoglycemic drugs in the past His A1c in 2016 has been consistently over 8% On his initial consultation was started on Victoza and subsequently started on U-500 insulin in 2/17 along with Toujeo INSULIN regimen previously was:  TOUJEO  74 in the morning and 74 in the evening HUMULIN U-500 insulin: 20/40 units in the morning, 60/70 at lunch and 70 at supper  Recent history:   Since 8/18 has been using V-go PUMP 20 unit basal, Humulin R U-500 insulin with boluses 4 units breakfast, 10 units acl  10-12 units Changes his pump at 8 am  Non-insulin hypoglycemic drugs the patient is taking are: Victoza 1.8 mg daily        His A1c is now 9%, previously 8.3  Current blood sugar patterns and problems identified:  He states that because of personal issues he has had difficulty focusing on a diabetes monitor  Although he is usually trying to change his V-go pump consistently in the early afternoon occasionally and after lunch he may occasionally forget  Also he is still having difficulty getting his boluses done before eating and frequently will do it right when he is eating or just after eating  Currently does not appear to be understanding the need to adjust his boluses based on what he is eating  Last night with a low carbohydrate meal his blood sugar at dinner was under 75 the previous night with eating a sandwich and the same amount of insulin his blood sugar went up all  the way to 286  Usually if he has a high reading after dinner it will raise his OVERNIGHT readings also  FASTING readings are variable but higher compared to his last visit  Again his HIGHEST readings are after evening meal  No hypoglycemia, rarely may have low normal readings around 6 p.m.   Glucose monitoring:  done 3-4  times a day         Glucometer:  freestyle libre 14-day     Blood Glucose readings by download:   CGM use % of time  97  2-week average/SD  164+/-53, was 153  Time in range  63      %, was 71  % Time Above 180  36  % Time above 250  7  % Time Below 70 0     PRE-MEAL Fasting Lunch Dinner Bedtime Overall  Glucose range:       Averages:  145  163  143   164   POST-MEAL PC Breakfast PC Lunch PC Dinner  Glucose range:     Averages:  162  164  190       Self-care:  Typical meal intake: Breakfast is sometimes fruit or cereal, Sometimes eating sausage and grits Has been trying to avoid high-fat foods or fried foods  Breakfast 8  AM Lunch 12 noon Dinner at 5-6 pm He drinks cranberry or V-8 juice and diet drinks                Dietician consultation: 1/19               Exercise: not walking, still wearing a boot for old ulcer  Weight history: Previous range 260-410  Wt Readings from Last 3 Encounters:  08/18/18 (!) 397 lb (180.1 kg)  05/27/18 (!) 385 lb 1.6 oz (174.7 kg)  05/16/18 (!) 389 lb (176.4 kg)    Glycemic control:   Lab Results  Component Value Date   HGBA1C 9.0 (H) 08/12/2018   HGBA1C 8.3 (H) 05/13/2018   HGBA1C 8.0 (H) 02/03/2018   Lab Results  Component Value Date   MICROALBUR 29.8 (H) 07/22/2017   LDLCALC 55 05/13/2018   CREATININE 1.75 (H) 08/12/2018      OTHER problems review today: See review of systems     Lab on 08/12/2018  Component Date Value Ref Range Status  . Fructosamine 08/12/2018 259  0 - 285 umol/L Final   Comment: Published reference interval for apparently healthy subjects between age 24 and 58 is 57 -  285 umol/L and in a poorly controlled diabetic population is 228 - 563 umol/L with a mean of 396 umol/L.   Marland Kitchen Sodium 08/12/2018 138  135 - 145 mEq/L Final  . Potassium 08/12/2018 4.6  3.5 - 5.1 mEq/L Final  . Chloride 08/12/2018 109  96 - 112 mEq/L Final  . CO2 08/12/2018 25  19 - 32 mEq/L Final  . Glucose, Bld 08/12/2018 156* 70 - 99 mg/dL Final  . BUN 08/12/2018 21  6 - 23 mg/dL Final  . Creatinine, Ser 08/12/2018 1.75* 0.40 - 1.50 mg/dL Final  . Calcium 08/12/2018 9.5  8.4 - 10.5 mg/dL Final  . GFR 08/12/2018 47.07* >60.00 mL/min Final  . Hgb A1c MFr Bld 08/12/2018 9.0* 4.6 - 6.5 % Final   Glycemic Control Guidelines for People with Diabetes:Non Diabetic:  <6%Goal of Therapy: <7%Additional Action Suggested:  >8%        Allergies as of 08/18/2018      Reactions   Vancomycin    REACTION: ARF      Medication List       Accurate as of August 18, 2018  9:12 AM. Always use your most recent med list.        ACCU-CHEK AVIVA PLUS w/Device Kit CHECK BLOOD SUGAR 5 TIMES A DAY   ACCU-CHEK FASTCLIX LANCETS Misc Check blood sugar 5 times a day   albuterol 108 (90 Base) MCG/ACT inhaler Commonly known as:  PROAIR HFA Inhale 2 puffs into the lungs every 6 (six) hours as needed for wheezing or shortness of breath.   amLODipine 10 MG tablet Commonly known as:  NORVASC Take 1 tablet (10 mg total) by mouth daily.   apixaban 5 MG Tabs tablet Commonly known as:  ELIQUIS Take 1 tablet (5 mg total) by mouth 2 (two) times daily.   atorvastatin 20 MG tablet Commonly known as:  LIPITOR TAKE 1 TABLET(20 MG) BY MOUTH DAILY   AZOPT 1 % ophthalmic suspension Generic drug:  brinzolamide Place 1 drop into both eyes 2 (two) times daily.   becaplermin 0.01 % gel Commonly known as:  REGRANEX Apply 1 application topically daily.   cholecalciferol 25 MCG (1000 UT) tablet Commonly known as:  VITAMIN D Take 2 tablets (2,000 Units total) by mouth daily.   diclofenac  sodium 1 % Gel Commonly  known as:  VOLTAREN Apply 4 g topically 4 (four) times daily.   docusate sodium 100 MG capsule Commonly known as:  COLACE Take 1 capsule (100 mg total) by mouth daily as needed for mild constipation.   enalapril 20 MG tablet Commonly known as:  VASOTEC TAKE 2 TABLETS BY MOUTH DAILY   escitalopram 10 MG tablet Commonly known as:  LEXAPRO TAKE 1 TABLET(10 MG) BY MOUTH DAILY   folic acid 1 MG tablet Commonly known as:  FOLVITE Take 1 tablet (1 mg total) by mouth daily.   FREESTYLE LIBRE 14 DAY SENSOR Misc 1 each by Does not apply route 6 (six) times daily.   furosemide 20 MG tablet Commonly known as:  LASIX Take 1 tablet (20 mg total) by mouth daily. Take an additional dose if your legs are swollen.   glucose blood test strip Commonly known as:  FREESTYLE PRECISION NEO TEST 1 each by Other route as needed for other. Use as instructed to check blood sugar 2 times daily.   glucose blood test strip Commonly known as:  ACCU-CHEK AVIVA PLUS USE TO CHECK BLOOD FIVE TIMES DAILY   HUMULIN R 500 UNIT/ML injection Generic drug:  insulin regular human CONCENTRATED DRAW INSULIN TO THE 68 UNIT LINE ON THE U-100 SYRINGE( TO USE 340 UNITS) PER DAY AS DIRECTED   HYDROcodone-acetaminophen 7.5-325 MG tablet Commonly known as:  NORCO Take 1 tablet by mouth every 8 (eight) hours as needed for moderate pain or severe pain.   latanoprost 0.005 % ophthalmic solution Commonly known as:  XALATAN Place 1 drop into both eyes at bedtime.   metoprolol succinate 25 MG 24 hr tablet Commonly known as:  TOPROL-XL TAKE 1 TABLET(25 MG) BY MOUTH DAILY   multivitamin with minerals Tabs tablet Take 1 tablet by mouth daily.   polyethylene glycol packet Commonly known as:  MIRALAX / GLYCOLAX Take 17 g by mouth daily.   promethazine 25 MG tablet Commonly known as:  PHENERGAN TAKE 1 TABLET BY MOUTH EVERY 8 HOURS AS NEEDED FOR NAUSEA OR VOMITING   silver sulfADIAZINE 1 % cream Commonly known as:   SILVADENE Apply 1 application topically daily.   SIMBRINZA 1-0.2 % Susp Generic drug:  Brinzolamide-Brimonidine Place 1 drop into both eyes 2 (two) times daily.   thiamine 100 MG tablet Take 1 tablet (100 mg total) by mouth daily.   V-GO 20 Kit USE 1 PUMP DAILY WITH INSULIN   VICTOZA 18 MG/3ML Sopn Generic drug:  liraglutide INJECT 1.8 MG ONCE DAILY AT THE SAME TIME       Allergies:  Allergies  Allergen Reactions  . Vancomycin     REACTION: ARF    Past Medical History:  Diagnosis Date  . Arthritis    "elbows & knees" (12/13/2014)  . Asthma   . CKD (chronic kidney disease), stage III ([place])   . DIABETIC FOOT ULCER 06/20/2009  . Edema, macular, due to secondary diabetes ([place])   . Erectile dysfunction   . GERD (gastroesophageal reflux disease)   . HEARING LOSS, SENSORINEURAL, BILATERAL 01/03/2007   Seen by ENT Dr. Orpah Greek D. Redmond Baseman 01/03/07  . Hemorrhoids   . History of echocardiogram    a. 04/2008 Echo: EF 50-55%, abnl LV relaxation, mildly dil LA.  Marland Kitchen Hyperlipidemia   . Hypertension   . Morbid obesity ([place])   . Neuropathy, lower extremity   . OSA (obstructive sleep apnea)    uses CPAP nightly  . OSA on  CPAP    Nocturnal polysomnogram on 01/21/2010 showed severe obstructive sleep apnea/hypopnea syndrome, AHI 74.1 per hour with non positional events, moderately loud snoring, and oxygen desaturation to a nadir of 78% on room air.  CPAP was successfully titrated to 17 CWP, AHI 1.1 per hour using a large ResMed Mirage Quattro full-face mask with heated humidifier. Bruxism was noted.   . Osteomyelitis of ankle and foot ([place])   . Retinopathy   . Type II diabetes mellitus (H[facility])    w/complication NOS, type II    Past Surgical History:  Procedure Laterality Date  . ACHILLES TENDON LENGTHENING Left 03/21/2018   Procedure: ACHILLES TENDON LENGTHENING;  Surgeon: Erle Crocker, MD;  Location: [facility];  Service: Orthopedics;  Laterality: Left;  . AMPUTATION Left 12/15/2014    Procedure: LEFT SECOND TOE AMPUTATION ;  Surgeon: Dorna Leitz, MD;  Location: [facility];  Service: Orthopedics;  Laterality: Left;  . AMPUTATION Left 03/21/2018   Procedure: TRANSMETATARSAL AMPUTATION LEFT FOOT;  Surgeon: Erle Crocker, MD;  Location: [facility];  Service: Orthopedics;  Laterality: Left;  Popliteal & ankle block  . 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 Kathalene Frames. Mayer Camel.  . WOUND DEBRIDEMENT Left 06/15/2016   Procedure: DEBRIDEMENT WOUND LEFT FOOT WITH GRAFT APPLICATION;  Surgeon: Landis Martins, DPM;  Location: [facility];  Service: Podiatry;  Laterality: Left;    Family History  Problem Relation Age of Onset  . Diabetes Mother        also 2 siblings  . Heart attack Father 62  . Throat cancer Brother     Social History:  reports that he quit smoking about 8 years ago. His smoking use included cigarettes. He has a 15.00 pack-year smoking history. He has never used smokeless tobacco. He reports current alcohol use of about 11.0 standard drinks of alcohol per week. He reports that he does not use drugs.    Review of Systems     Lipid management: taking Lipitor 20 mg, followed by PCP Also has low HDL    Lab Results  Component Value Date   CHOL 114 05/13/2018   HDL 29.00 (L) 05/13/2018   LDLCALC 55 05/13/2018   TRIG 146.0 05/13/2018   CHOLHDL 4 05/13/2018            Hypertension: Has had hypertension for a few years treated with enalapril,  taking 20 mg   Also on Lasix and 25 mg metoprolol, and  taking amlodipine 10 mg He thinks blood pressure is higher at home now  This is treated by PCP/cardiologist   BP Readings from Last 3 Encounters:  08/18/18 (!) 156/72  05/27/18 (!) 132/56  05/16/18 128/74    He  has chronic renal dysfunction, not seeing a nephrologist his Lasix was reduced on his previous visit with PCP and he is due for follow-up soon Has mild  microalbuminuria  Lab Results  Component Value Date   CREATININE 1.75 (H) 08/12/2018   CREATININE 1.95 (H) 05/13/2018   CREATININE 1.49 (H) 03/24/2018    He had osteomyelitis and left toe amputation was done in September   Physical Examination:  BP (!) 156/72 (BP Location: Left Arm, Patient Position: Sitting, Cuff Size: Normal)   Pulse 60   Ht '6\' 6"'$  (1.981 m)   Wt (!) 397 lb (180.1 kg)   SpO2 98%   BMI 45.88 kg/m      ASSESSMENT:  Diabetes type 2, with morbid obesity   See history of present illness for detailed discussion of current diabetes management, blood sugar patterns and problems identified  He is on insulin using V-go pump and benefiting from U-500 insulin.  Also on Victoza  His blood sugars are now not as well controlled and A1c has gone up to 9%  Blood sugar patterns were analyzed from his freestyle libre discussed with patient Recently his average blood sugar is not significantly high but blood sugars are mostly high after dinner and also carry through into the night Day to day adjustment of mealtime boluses, timing of boluses, changing the pump and rotating injection sites for discussed  LIPIDS: Well controlled  Renal dysfunction slightly better but needs better long-term management    PLAN:     He thinks he can do better with focusing on planning his meals  We will also try to adjust his boluses between 2 and 4 units based on how much carbohydrate he is getting and discussed various types of carbohydrates  For his average meal he will take 3 clicks  Discussed needing to take his bolus 30-minute before eating and plan this accordingly  He may need to take additional clicks for higher fat meals or even take a correction bolus later at night He will change the pump at the same time Also he will let us know if he has difficulty getting supplies for the pump since there is apparently a problem with the company If he is able to start walking would be  desirable to lose weight which he has difficulty with  He will discuss his blood pressure management and nephrology consultation with PCP soon   There are no Patient Instructions on file for this visit.  Counseling time on subjects discussed in assessment and plan sections is over 50% of today's 25 minute visit   Elayne Snare 08/18/2018, 9:12 AM   Note: This office note was prepared with Dragon voice recognition system technology. Any transcriptional errors that result from this process are unintentional.

## 2018-08-22 DIAGNOSIS — Z79891 Long term (current) use of opiate analgesic: Secondary | ICD-10-CM | POA: Diagnosis not present

## 2018-08-22 DIAGNOSIS — M545 Low back pain: Secondary | ICD-10-CM | POA: Diagnosis not present

## 2018-08-22 DIAGNOSIS — G894 Chronic pain syndrome: Secondary | ICD-10-CM | POA: Diagnosis not present

## 2018-08-22 DIAGNOSIS — M79672 Pain in left foot: Secondary | ICD-10-CM | POA: Diagnosis not present

## 2018-08-23 ENCOUNTER — Ambulatory Visit (INDEPENDENT_AMBULATORY_CARE_PROVIDER_SITE_OTHER): Payer: Medicare Other | Admitting: Internal Medicine

## 2018-08-23 ENCOUNTER — Encounter: Payer: Self-pay | Admitting: Internal Medicine

## 2018-08-23 ENCOUNTER — Other Ambulatory Visit: Payer: Self-pay

## 2018-08-23 VITALS — BP 137/64 | HR 62 | Temp 98.1°F | Ht 78.0 in | Wt 393.7 lb

## 2018-08-23 DIAGNOSIS — I10 Essential (primary) hypertension: Secondary | ICD-10-CM

## 2018-08-23 DIAGNOSIS — R0600 Dyspnea, unspecified: Secondary | ICD-10-CM | POA: Diagnosis not present

## 2018-08-23 DIAGNOSIS — L97529 Non-pressure chronic ulcer of other part of left foot with unspecified severity: Secondary | ICD-10-CM | POA: Diagnosis not present

## 2018-08-23 DIAGNOSIS — Z6841 Body Mass Index (BMI) 40.0 and over, adult: Secondary | ICD-10-CM

## 2018-08-23 DIAGNOSIS — E11649 Type 2 diabetes mellitus with hypoglycemia without coma: Secondary | ICD-10-CM | POA: Diagnosis not present

## 2018-08-23 DIAGNOSIS — F321 Major depressive disorder, single episode, moderate: Secondary | ICD-10-CM

## 2018-08-23 DIAGNOSIS — K219 Gastro-esophageal reflux disease without esophagitis: Secondary | ICD-10-CM

## 2018-08-23 DIAGNOSIS — G894 Chronic pain syndrome: Secondary | ICD-10-CM

## 2018-08-23 DIAGNOSIS — R110 Nausea: Secondary | ICD-10-CM

## 2018-08-23 DIAGNOSIS — E1142 Type 2 diabetes mellitus with diabetic polyneuropathy: Secondary | ICD-10-CM | POA: Diagnosis not present

## 2018-08-23 DIAGNOSIS — G4733 Obstructive sleep apnea (adult) (pediatric): Secondary | ICD-10-CM | POA: Diagnosis not present

## 2018-08-23 DIAGNOSIS — N183 Chronic kidney disease, stage 3 unspecified: Secondary | ICD-10-CM

## 2018-08-23 DIAGNOSIS — I480 Paroxysmal atrial fibrillation: Secondary | ICD-10-CM

## 2018-08-23 LAB — GLUCOSE, CAPILLARY: Glucose-Capillary: 170 mg/dL — ABNORMAL HIGH (ref 70–99)

## 2018-08-23 MED ORDER — GABAPENTIN 300 MG PO CAPS: 300.0000 mg | ORAL_CAPSULE | Freq: Every day | ORAL | 1 refills | Status: DC

## 2018-08-23 MED ORDER — ALBUTEROL SULFATE HFA 108 (90 BASE) MCG/ACT IN AERS: 2.0000 | INHALATION_SPRAY | Freq: Four times a day (QID) | RESPIRATORY_TRACT | 3 refills | Status: AC | PRN

## 2018-08-23 NOTE — Progress Notes (Signed)
0

## 2018-08-23 NOTE — Assessment & Plan Note (Signed)
-  This problem is chronic and stable -Patient states that this his reflux symptoms are well controlled -He was recently on omeprazole 20 mg daily for this but I do not see this on his current medication list -We will follow-up with him on his next visit to see if he is still taking this medication or if his symptoms are resolved without it

## 2018-08-23 NOTE — Assessment & Plan Note (Signed)
-  This problem is chronic and stable -Patient states that he uses Phenergan only sparingly for this now.  He has not had many episodes of nausea since his last visit. -No further work-up at this time -We will continue to monitor him

## 2018-08-23 NOTE — Assessment & Plan Note (Signed)
-  This problem is chronic and stable -Patient with a regular rhythm on exam today -He denies any palpitations or lightheadedness or syncope -We will continue with Toprol-XL 25 mg daily as well as apixaban for anticoagulation -No further work-up at this time

## 2018-08-23 NOTE — Assessment & Plan Note (Signed)
-  Patient states that his diabetic neuropathy has worsened slightly since he has stopped his gabapentin -Explained to the patient importance of keeping his blood sugars under control -We will restart gabapentin 300 mg nightly for now -No further work-up at this time

## 2018-08-23 NOTE — Patient Instructions (Signed)
-  It was a pleasure seeing you today -I will refer you to nephrology for follow-up of your kidney function -I have also referred you to [facility] who is our behavioral clinician for help with the stress you have had over the last couple of months -I have refilled gabapentin and pro-air for you -Please call me if you have any questions or if you need any further refills

## 2018-08-23 NOTE — Assessment & Plan Note (Signed)
-  This problem is chronic and stable -Patient states that he is compliant with the CPAP at night -He denies any daytime somnolence or headaches -No further work-up at this time

## 2018-08-23 NOTE — Assessment & Plan Note (Signed)
-  Patient is now status post left transmetatarsal amputation -This wound appears to have healed well and he has no erythema or discharge from the surgical site -No tenderness noted on palpation -Patient to follow-up with podiatry tomorrow -No further work-up at this time

## 2018-08-23 NOTE — Assessment & Plan Note (Signed)
-  This problem is chronic and stable -Patient had a recent BMP which showed a creatinine 1.75 which is close to his baseline -Patient states that he is concerned about his kidneys and was told that he needed to see a nephrologist for this -Patient's creatinine has been relatively stable between 1.4 and 1.7 for a while.  However, given his multiple comorbidities and his slightly worsening creatinine will refer him to nephrology for further evaluation -No further work-up at this time

## 2018-08-23 NOTE — Assessment & Plan Note (Signed)
-  Patient states that he has been under increased stress over the last couple of months due to multiple deaths in his family including his son -However, his PHQ 9 score today is 0 and he states that he feels well mentally and does not feel depressed or suicidal -I still discussed with the patient importance of grief counseling given the increased stress that he has been under.  Patient is agreeable to this and will follow-up with Miquel Dunn for this -Referral placed to integrated behavioral health -Patient states that he was prescribed Lexapro on his prior visit but has not started this medication yet and does not want to at this time -No further work-up for now

## 2018-08-23 NOTE — Assessment & Plan Note (Signed)
-  This problem is chronic and stable -Patient's weight appears to have increased slightly since his last visit (approximately 3 to 4 pounds) -Patient was encouraged to follow diet and to exercise as possible -No further work-up at this time.  We will follow-up at his next visit

## 2018-08-23 NOTE — Progress Notes (Signed)
   Subjective:    Patient ID: [name], male    DOB: Jul 28, 1950, 68 y.o.   MRN: [id number]  HPI I have seen and examined this patient.  Patient is here for routine follow-up of his hypertension and CKD.  Patient states that he has been under increased stress over the last couple of months as his son passed away and then his brother-in-law passed away and a friend passed away as well.  He also states that since stopping his gabapentin his neuropathy is mildly worsened and he would like to consider restarting this medication.  Patient also states that he has been having some mild pain in his left foot when he is wearing shoes and will follow-up with his podiatrist tomorrow.  He denies any other complaints at this time.  He states he is compliant with his medications.   Review of Systems  Constitutional: Negative.   HENT: Negative.   Respiratory: Negative.   Cardiovascular: Negative.   Gastrointestinal: Negative.   Musculoskeletal: Negative for arthralgias, gait problem, joint swelling and myalgias.       Mild pain in left foot  Neurological: Negative.   Psychiatric/Behavioral: Negative.        Objective:   Physical Exam Constitutional:      Appearance: Normal appearance.  HENT:     Head: Normocephalic and atraumatic.     Mouth/Throat:     Mouth: Mucous membranes are moist.     Pharynx: Oropharynx is clear. No oropharyngeal exudate.  Neck:     Musculoskeletal: Neck supple.  Cardiovascular:     Rate and Rhythm: Normal rate and regular rhythm.     Heart sounds: Normal heart sounds.  Pulmonary:     Effort: Pulmonary effort is normal.     Breath sounds: Normal breath sounds. No wheezing or rales.  Abdominal:     General: Bowel sounds are normal. There is no distension.     Palpations: Abdomen is soft.     Tenderness: There is no abdominal tenderness.  Musculoskeletal:        General: Swelling (Patient with trace bilateral lower extremity edema) present.     Comments:  Left foot status post transmetatarsal amputation.  This is now well-healed  Lymphadenopathy:     Cervical: No cervical adenopathy.  Neurological:     Mental Status: He is alert and oriented to person, place, and time. Mental status is at baseline.  Psychiatric:        Mood and Affect: Mood normal.        Behavior: Behavior normal.           Assessment & Plan:  Please see problem based charting for assessment and plan:

## 2018-08-23 NOTE — Assessment & Plan Note (Signed)
-  Patient has a history of exertional dyspnea which has been there for years -He has been on albuterol for this which is been prescribed by his prior PCP -I have refilled this medication for him today -We will obtain PFTs on his follow-up visit -I do believe that his dyspnea on exertion may be from deconditioning rather than from an underlying pulmonary disease -We will monitor him for this

## 2018-08-23 NOTE — Assessment & Plan Note (Signed)
-  This problem is chronic and stable -Patient states that his blood sugars have been high over the last couple of months and he attributes this to stress secondary to the deaths in his family -His last A1c was 9.0 -Patient follows up with Dr. Dwyane Dee (endocrinology) for this. -We will continue with insulin regimen, Victoza per Dr. Dwyane Dee -No further work-up at this time

## 2018-08-23 NOTE — Assessment & Plan Note (Signed)
BP Readings from Last 3 Encounters:  08/23/18 137/64  08/18/18 (!) 156/72  05/27/18 (!) 132/56    Lab Results  Component Value Date   NA 138 08/12/2018   K 4.6 08/12/2018   CREATININE 1.75 (H) 08/12/2018    Assessment: Blood pressure control:  Well controlled Progress toward BP goal:   At goal Comments: Patient is compliant with Lasix 20 mg daily, Toprol-XL 25 mg daily, enalapril 20 mg daily and amlodipine 10 mg daily  Plan: Medications:  continue current medications Educational resources provided:   Self management tools provided:   Other plans: We will check BMP on follow-up visit

## 2018-08-23 NOTE — Assessment & Plan Note (Signed)
-  This problem is chronic and stable -Patient states that he follows with Dr. Mirna Mires and saw him recently for this -He continues to remain on hydrocodone for his pain -No further work-up at this time

## 2018-08-24 DIAGNOSIS — M24572 Contracture, left ankle: Secondary | ICD-10-CM | POA: Diagnosis not present

## 2018-08-26 ENCOUNTER — Telehealth: Payer: Self-pay

## 2018-08-26 ENCOUNTER — Encounter: Payer: Self-pay | Admitting: *Deleted

## 2018-08-26 NOTE — Telephone Encounter (Signed)
3 vgo 20s placed up front for the pt to pick up and he will also call his pharmacy

## 2018-08-26 NOTE — Telephone Encounter (Signed)
Please ask if [name] can get any samples.  He needs to have his pharmacist call around to see if they can get it from other places.  Meanwhile we can give him a sample kit.  Also recommend that he fill out the application to check on the coverage for Omnipod

## 2018-08-26 NOTE — Telephone Encounter (Signed)
Received fax from pt's pharmacy, and at this time, they are unable to get V-GO pumps.  Please advise.

## 2018-08-30 ENCOUNTER — Telehealth: Payer: Self-pay | Admitting: Licensed Clinical Social Worker

## 2018-08-30 DIAGNOSIS — E113513 Type 2 diabetes mellitus with proliferative diabetic retinopathy with macular edema, bilateral: Secondary | ICD-10-CM | POA: Diagnosis not present

## 2018-08-30 NOTE — Telephone Encounter (Signed)
Patient was contacted given a referral to [facility] from his PCP. Patient reported that he was interested in grief counseling and an appointment was scheduled for 09/15/18 at 11 am.   -Jackalyn Lombard, Behavioral Health Intern

## 2018-09-05 DIAGNOSIS — G4733 Obstructive sleep apnea (adult) (pediatric): Secondary | ICD-10-CM | POA: Diagnosis not present

## 2018-09-09 DIAGNOSIS — M24572 Contracture, left ankle: Secondary | ICD-10-CM | POA: Diagnosis not present

## 2018-09-12 ENCOUNTER — Telehealth: Payer: Self-pay | Admitting: Dietician

## 2018-09-12 ENCOUNTER — Telehealth: Payer: Self-pay | Admitting: Endocrinology

## 2018-09-12 NOTE — Telephone Encounter (Signed)
Per [facility] patient assistance line the [name] is "no longer being manufactured" and patients are not able to get [employer].  This patient via [facility] patient assistance line is requesting something other than Vigo to be prescribed - something that is in stock and able to be filled.  Call back to patient is ok

## 2018-09-12 NOTE — Telephone Encounter (Signed)
Attempted to call pt and inform him to call various pharmacies and see if they have the V-Go in stock. Pt did not answer. Will attempt to call back.

## 2018-09-13 ENCOUNTER — Other Ambulatory Visit: Payer: Self-pay | Admitting: *Deleted

## 2018-09-13 ENCOUNTER — Other Ambulatory Visit: Payer: Self-pay | Admitting: Internal Medicine

## 2018-09-13 DIAGNOSIS — L97521 Non-pressure chronic ulcer of other part of left foot limited to breakdown of skin: Secondary | ICD-10-CM | POA: Diagnosis not present

## 2018-09-13 DIAGNOSIS — E11621 Type 2 diabetes mellitus with foot ulcer: Secondary | ICD-10-CM | POA: Diagnosis not present

## 2018-09-13 DIAGNOSIS — E119 Type 2 diabetes mellitus without complications: Secondary | ICD-10-CM | POA: Diagnosis not present

## 2018-09-13 DIAGNOSIS — Z794 Long term (current) use of insulin: Secondary | ICD-10-CM | POA: Diagnosis not present

## 2018-09-13 NOTE — Telephone Encounter (Signed)
Calling to discuss Vgo samples. We do not have any of the ones he uses.

## 2018-09-13 NOTE — Telephone Encounter (Signed)
Next appt scheduled 5/12 with PCP. 

## 2018-09-14 ENCOUNTER — Telehealth: Payer: Self-pay | Admitting: Endocrinology

## 2018-09-14 DIAGNOSIS — Z79891 Long term (current) use of opiate analgesic: Secondary | ICD-10-CM | POA: Diagnosis not present

## 2018-09-14 DIAGNOSIS — G894 Chronic pain syndrome: Secondary | ICD-10-CM | POA: Diagnosis not present

## 2018-09-14 DIAGNOSIS — M545 Low back pain: Secondary | ICD-10-CM | POA: Diagnosis not present

## 2018-09-14 DIAGNOSIS — M79672 Pain in left foot: Secondary | ICD-10-CM | POA: Diagnosis not present

## 2018-09-14 MED ORDER — V-GO 20 KIT: PACK | 3 refills | Status: DC

## 2018-09-14 NOTE — Telephone Encounter (Signed)
If we have enough samples of the 20 unit basal we can give him. Otherwise he will have to use the U-500 insulin with syringe, taking 6 units in the morning, 14 units before lunch and 18 before dinner

## 2018-09-14 NOTE — Telephone Encounter (Signed)
Called pt and left voicemail to call back.

## 2018-09-14 NOTE — Telephone Encounter (Signed)
Patient requests to be called re: V-Go 20 pump. Patient is unable to find the above at any Pharmacy. [facility] was supposed to contact our office to fill a RX. Please call patient at ph# [phone] re: what should patient do when he is out of medication.

## 2018-09-14 NOTE — Telephone Encounter (Signed)
Per [facility], "Caller is needing to speak to Dr.Kumar's nurse missed the phone call in regards to his insulin pump. Please call."

## 2018-09-14 NOTE — Telephone Encounter (Signed)
Pt called, triage called dr Ronnie Derby office, spoke w/ noah, he will contact pt.

## 2018-09-14 NOTE — Telephone Encounter (Signed)
Patient is returning call.  °

## 2018-09-14 NOTE — Telephone Encounter (Signed)
Pt stated that he has 1 v-go pump for today and then he will be out. [facility] is sending the pt more pumps, but the pt would like instructions on how to dose insulin manually while waiting on the new shipment on pumps. Please advise.

## 2018-09-14 NOTE — Telephone Encounter (Signed)
Addressed in previous tele note.

## 2018-09-14 NOTE — Telephone Encounter (Signed)
Called pt and gave him MD message dosing. Pt verbalized understanding.

## 2018-09-15 ENCOUNTER — Encounter: Payer: Self-pay | Admitting: Licensed Clinical Social Worker

## 2018-09-15 ENCOUNTER — Ambulatory Visit (INDEPENDENT_AMBULATORY_CARE_PROVIDER_SITE_OTHER): Payer: Medicare Other | Admitting: Licensed Clinical Social Worker

## 2018-09-15 DIAGNOSIS — F0631 Mood disorder due to known physiological condition with depressive features: Secondary | ICD-10-CM

## 2018-09-15 NOTE — BH Specialist Note (Signed)
Integrated Behavioral Health Initial Visit  MRN: [id number] Name: [name]  Number of [facility]ician visits:: 1/6 Session Start time: 11:10  Session End time: 11;40 Total time: 30 minutes  Type of Service: Integrated Behavioral Health- Individual Interpretor:No.          SUBJECTIVE: [name] is a 68 y.o. male  whom attended the session individually.  Patient was referred by Dr.Narendra for depression. Patient reports the following symptoms/concerns: mild symptoms of feeling down, fatigue, issues with concentration, little interest in doing things, and chronic health concerns. Duration of problem: increased over the past month; Severity of problem: mild  OBJECTIVE: Mood: Netural and Affect: Constricted Risk of harm to self or others: No plan to harm self or others  LIFE CONTEXT: Family and Social: Patient reported he lives with his wife, but she is "in and out of their home". Patient reported they have different opinions, and have conflict at times. Patient has one living child, and reported two of his children have passed away. Patient does have two social supports that he speaks to often, and identified as healthy relationships.  School/Work: Not working. Self-Care: Patient identified his worry and feelings of being down stem from his health related issues. Patient is very concerned about not having his medication when his prescriptions are about to run out or expire.  Life Changes: Continuing to grieve the loss of his family members.   GOALS ADDRESSED: Patient will: 1. Reduce symptoms of: stress and sadness. 2. Increase knowledge and/or ability of: coping skills, healthy habits and stress reduction  3. Demonstrate ability to: Increase healthy adjustment to current life circumstances, Increase adequate support systems for patient/family and Begin healthy grieving over loss  INTERVENTIONS: Interventions utilized: Brief CBT and  Supportive Counseling. CBT was used to identify negative thoughts that trigger the patient to feel down or worry. CBT was used to challenge negative thoughts, and teach the patient how to reframe his thought patterns to be more solution-focused.  Standardized Assessments completed: PHQ 9, score was 4.   ASSESSMENT: Patient currently experiencing mild symptoms of feeling down, fatigue, lack of interest, and issues with concentration.  Patient identified he feels happy more days than not. Patient reported on the days he feels down he has negative thought patterns that cycle through his mind that lead to him feeling overwhelmed. Patient was prescribed medication for depression, but is refusing to take it. Patient feels the majority of his worry and feeling down stems from his health concerns. Patient worries about his medication not making it to his home on time and complications from his diabetes.   Patient is continuing to grieve the loss of his children and other family members. Patient reported this is an area of his life he wants to work on individually at this time.     Patient may benefit from bi-weekly outpatient therapy.  PLAN: 1. Follow up with behavioral health clinician on : as needed.   Dessie Coma, LPC, LCAS

## 2018-09-20 ENCOUNTER — Other Ambulatory Visit: Payer: Self-pay

## 2018-09-20 MED ORDER — INSULIN REGULAR HUMAN (CONC) 500 UNIT/ML ~~LOC~~ SOLN: SUBCUTANEOUS | 0 refills | Status: DC

## 2018-09-20 NOTE — Telephone Encounter (Signed)
Humulin u 500 needs to be called in for this patient so he has insulin since he cannot get more of the vgo send to [facility] on [address]

## 2018-09-20 NOTE — Telephone Encounter (Signed)
Rx sent 

## 2018-09-21 ENCOUNTER — Telehealth: Payer: Self-pay | Admitting: Endocrinology

## 2018-09-21 ENCOUNTER — Other Ambulatory Visit: Payer: Self-pay

## 2018-09-21 MED ORDER — INSULIN REGULAR HUMAN (CONC) 500 UNIT/ML ~~LOC~~ SOPN: PEN_INJECTOR | SUBCUTANEOUS | 3 refills | Status: DC

## 2018-09-21 NOTE — Telephone Encounter (Signed)
MEDICATION: Humulin R Flexpen  PHARMACY:  [facility] on [address]  IS THIS A 90 DAY SUPPLY : yes  IS PATIENT OUT OF MEDICATION: yes  IF NOT; HOW MUCH IS LEFT:   LAST APPOINTMENT DATE: @3 /04/2019  NEXT APPOINTMENT DATE:@5 /10/2018  DO WE HAVE YOUR PERMISSION TO LEAVE A DETAILED MESSAGE: YES  OTHER COMMENTS: patient would like a call to confirm RX has been sent   **Let patient know to contact pharmacy at the end of the day to make sure medication is ready. **  ** Please notify patient to allow 48-72 hours to process**  **Encourage patient to contact the pharmacy for refills or they can request refills through [facility]**

## 2018-09-21 NOTE — Telephone Encounter (Signed)
Rx sent and pt called and notified. 

## 2018-09-23 DIAGNOSIS — E113513 Type 2 diabetes mellitus with proliferative diabetic retinopathy with macular edema, bilateral: Secondary | ICD-10-CM | POA: Diagnosis not present

## 2018-09-23 DIAGNOSIS — H401112 Primary open-angle glaucoma, right eye, moderate stage: Secondary | ICD-10-CM | POA: Diagnosis not present

## 2018-09-24 DIAGNOSIS — L97509 Non-pressure chronic ulcer of other part of unspecified foot with unspecified severity: Secondary | ICD-10-CM | POA: Diagnosis not present

## 2018-09-28 DIAGNOSIS — E113513 Type 2 diabetes mellitus with proliferative diabetic retinopathy with macular edema, bilateral: Secondary | ICD-10-CM | POA: Diagnosis not present

## 2018-09-28 DIAGNOSIS — E119 Type 2 diabetes mellitus without complications: Secondary | ICD-10-CM | POA: Diagnosis not present

## 2018-09-28 DIAGNOSIS — H40033 Anatomical narrow angle, bilateral: Secondary | ICD-10-CM | POA: Diagnosis not present

## 2018-10-06 IMAGING — DX DG FOOT COMPLETE 3+V*L*
3 series · 3 of 3 positions shown · non-contrast
Comparison: Left foot series of November 12, 2015

CLINICAL DATA: Follow-up of a wound over the left food foot at the
level of the toes.

EXAM:
LEFT FOOT - COMPLETE 3+ VIEW

[foot ap]
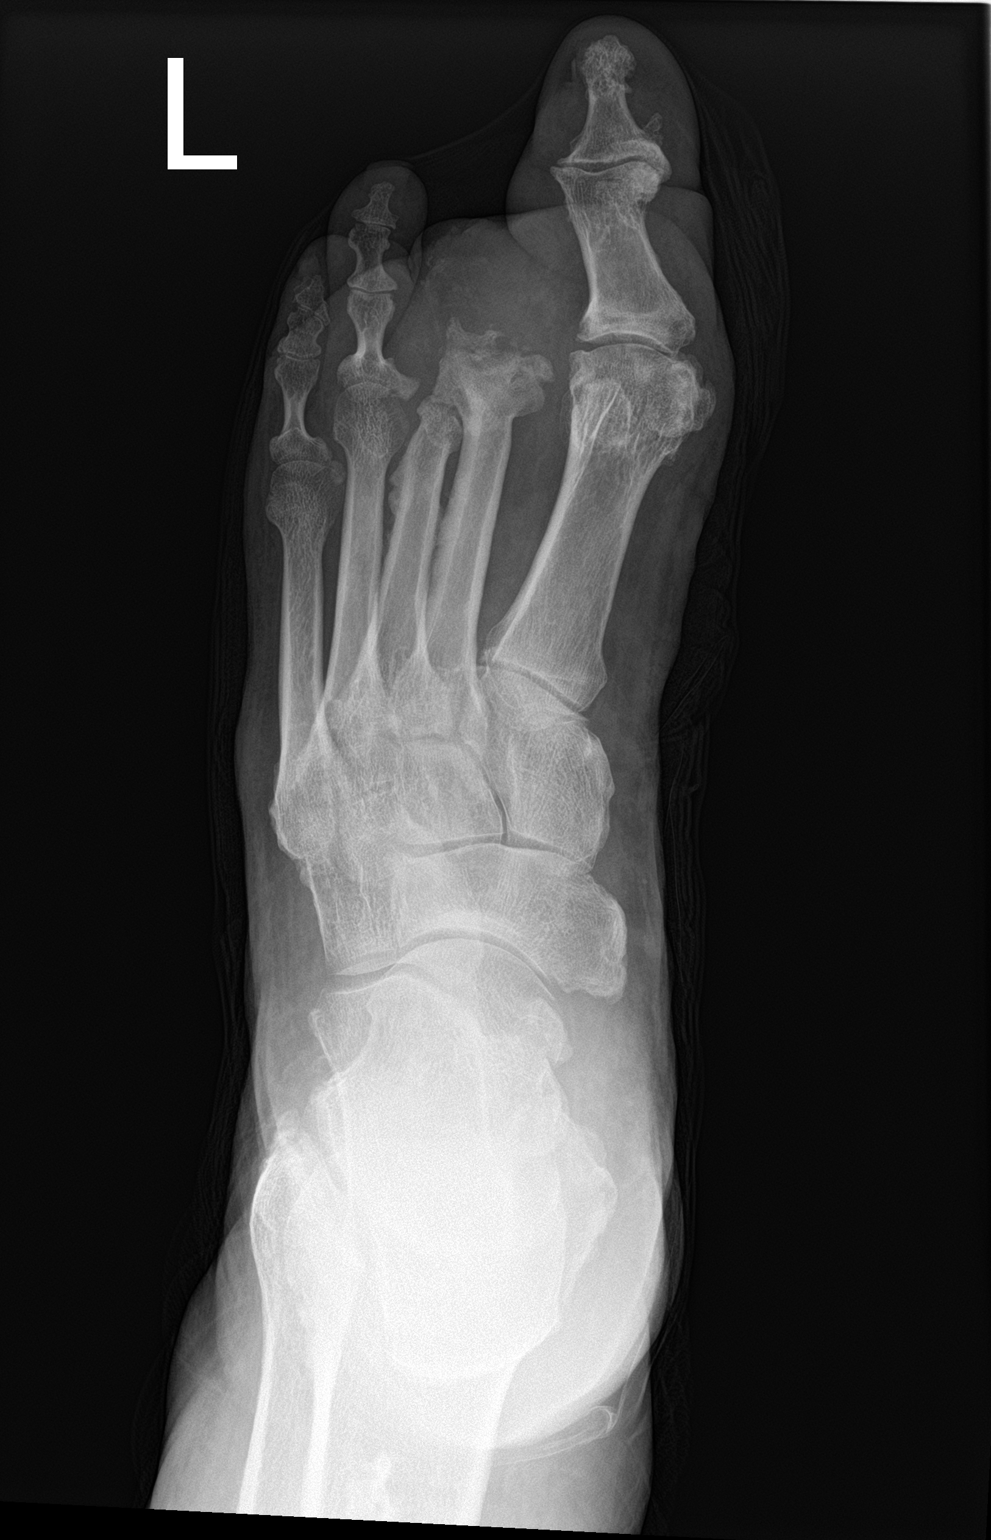

[foot obl]
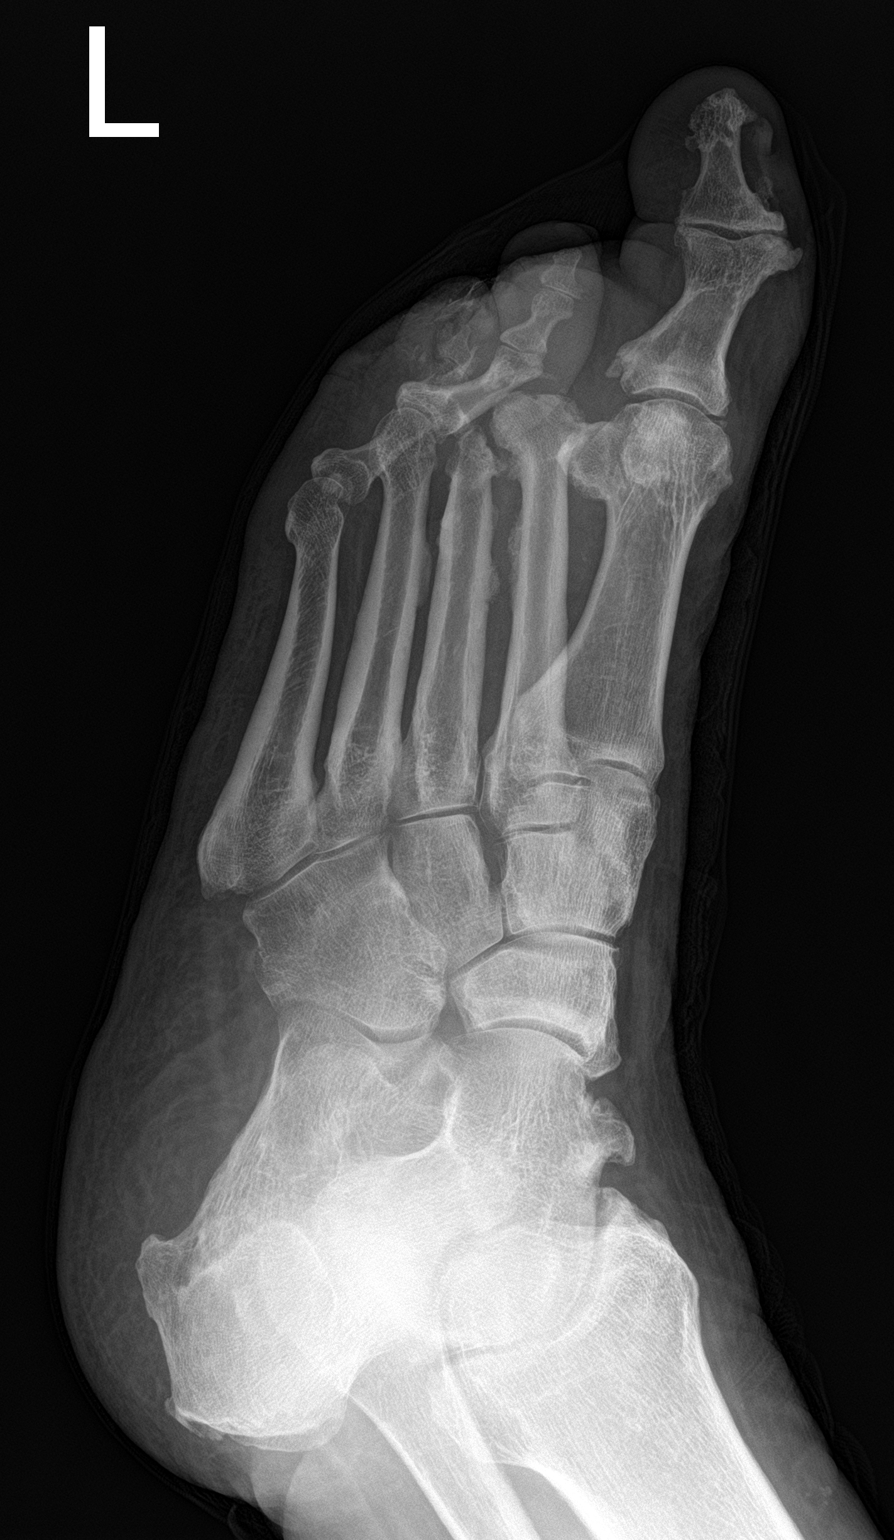

[foot lat]
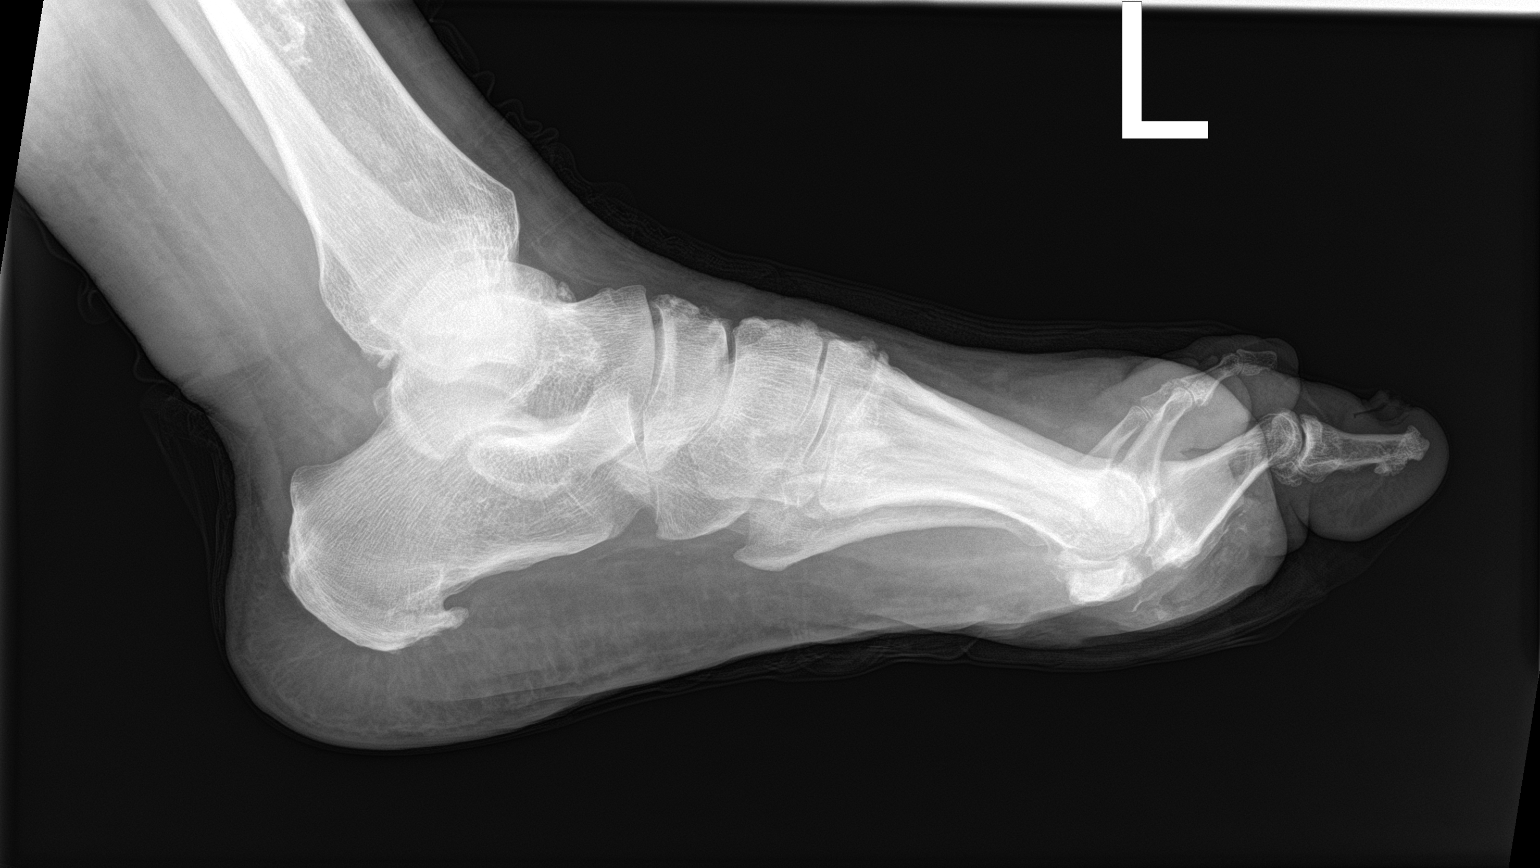

[3 of 3 positions shown; findings below may reference images not displayed]

FINDINGS: The patient has undergone previous amputation of the second and
third toes. There is heterotopic bone at the surgical site. There is
soft tissue fullness over the ventral aspect of the MTP joint
region. There is no objective evidence of acute osteomyelitis. There
are mild osteoarthritic changes of the great toe. The bones of the
hindfoot exhibit no acute abnormalities.
IMPRESSION: Soft tissue fullness over the plantar aspect of the amputation site
which likely reflects cellulitis. There is soft tissue
calcification-heterotopic bone adjacent to the head of the second
metatarsal. No objective evidence of acute osteomyelitis.

## 2018-10-10 DIAGNOSIS — L97509 Non-pressure chronic ulcer of other part of unspecified foot with unspecified severity: Secondary | ICD-10-CM | POA: Diagnosis not present

## 2018-10-10 DIAGNOSIS — G4733 Obstructive sleep apnea (adult) (pediatric): Secondary | ICD-10-CM | POA: Diagnosis not present

## 2018-10-11 ENCOUNTER — Other Ambulatory Visit: Payer: Self-pay | Admitting: Internal Medicine

## 2018-10-11 MED ORDER — LATANOPROST 0.005 % OP SOLN: 1.0000 [drp] | Freq: Every day | OPHTHALMIC | 0 refills | Status: DC

## 2018-10-11 MED ORDER — AZOPT 1 % OP SUSP: 1.0000 [drp] | Freq: Two times a day (BID) | OPHTHALMIC | 1 refills | Status: DC

## 2018-10-11 NOTE — Telephone Encounter (Signed)
Called pt - stated he had called the eye doctor's office but the answering machine stated the office is closed d/t coronavirus.

## 2018-10-11 NOTE — Telephone Encounter (Signed)
Refill Request  latanoprost (XALATAN) 0.005 % ophthalmic solution AZOPT 1 % ophthalmic suspension  [id number] - [place], [place] - [address] AT [address]

## 2018-10-12 DIAGNOSIS — Z79891 Long term (current) use of opiate analgesic: Secondary | ICD-10-CM | POA: Diagnosis not present

## 2018-10-12 DIAGNOSIS — M79672 Pain in left foot: Secondary | ICD-10-CM | POA: Diagnosis not present

## 2018-10-12 DIAGNOSIS — G894 Chronic pain syndrome: Secondary | ICD-10-CM | POA: Diagnosis not present

## 2018-10-12 DIAGNOSIS — M545 Low back pain: Secondary | ICD-10-CM | POA: Diagnosis not present

## 2018-10-14 ENCOUNTER — Other Ambulatory Visit: Payer: Self-pay | Admitting: Internal Medicine

## 2018-10-14 DIAGNOSIS — Z794 Long term (current) use of insulin: Secondary | ICD-10-CM | POA: Diagnosis not present

## 2018-10-14 DIAGNOSIS — E119 Type 2 diabetes mellitus without complications: Secondary | ICD-10-CM | POA: Diagnosis not present

## 2018-10-14 DIAGNOSIS — E11621 Type 2 diabetes mellitus with foot ulcer: Secondary | ICD-10-CM | POA: Diagnosis not present

## 2018-10-14 DIAGNOSIS — L97521 Non-pressure chronic ulcer of other part of left foot limited to breakdown of skin: Secondary | ICD-10-CM | POA: Diagnosis not present

## 2018-10-14 NOTE — Telephone Encounter (Signed)
Next appt scheduled 5/12 with PCP.

## 2018-10-25 DIAGNOSIS — E1122 Type 2 diabetes mellitus with diabetic chronic kidney disease: Secondary | ICD-10-CM | POA: Diagnosis not present

## 2018-10-25 DIAGNOSIS — R809 Proteinuria, unspecified: Secondary | ICD-10-CM | POA: Diagnosis not present

## 2018-10-25 DIAGNOSIS — E1129 Type 2 diabetes mellitus with other diabetic kidney complication: Secondary | ICD-10-CM | POA: Diagnosis not present

## 2018-10-25 DIAGNOSIS — I129 Hypertensive chronic kidney disease with stage 1 through stage 4 chronic kidney disease, or unspecified chronic kidney disease: Secondary | ICD-10-CM | POA: Diagnosis not present

## 2018-10-25 DIAGNOSIS — N183 Chronic kidney disease, stage 3 (moderate): Secondary | ICD-10-CM | POA: Diagnosis not present

## 2018-11-02 ENCOUNTER — Other Ambulatory Visit: Payer: Self-pay | Admitting: Internal Medicine

## 2018-11-02 DIAGNOSIS — L97509 Non-pressure chronic ulcer of other part of unspecified foot with unspecified severity: Secondary | ICD-10-CM | POA: Diagnosis not present

## 2018-11-07 ENCOUNTER — Other Ambulatory Visit: Payer: Self-pay | Admitting: Internal Medicine

## 2018-11-07 DIAGNOSIS — K5900 Constipation, unspecified: Secondary | ICD-10-CM | POA: Diagnosis not present

## 2018-11-07 DIAGNOSIS — Z1211 Encounter for screening for malignant neoplasm of colon: Secondary | ICD-10-CM | POA: Diagnosis not present

## 2018-11-07 DIAGNOSIS — E1165 Type 2 diabetes mellitus with hyperglycemia: Secondary | ICD-10-CM | POA: Diagnosis not present

## 2018-11-09 DIAGNOSIS — M545 Low back pain: Secondary | ICD-10-CM | POA: Diagnosis not present

## 2018-11-09 DIAGNOSIS — G894 Chronic pain syndrome: Secondary | ICD-10-CM | POA: Diagnosis not present

## 2018-11-09 DIAGNOSIS — M79672 Pain in left foot: Secondary | ICD-10-CM | POA: Diagnosis not present

## 2018-11-09 DIAGNOSIS — Z79891 Long term (current) use of opiate analgesic: Secondary | ICD-10-CM | POA: Diagnosis not present

## 2018-11-13 DIAGNOSIS — L97521 Non-pressure chronic ulcer of other part of left foot limited to breakdown of skin: Secondary | ICD-10-CM | POA: Diagnosis not present

## 2018-11-13 DIAGNOSIS — E119 Type 2 diabetes mellitus without complications: Secondary | ICD-10-CM | POA: Diagnosis not present

## 2018-11-13 DIAGNOSIS — Z794 Long term (current) use of insulin: Secondary | ICD-10-CM | POA: Diagnosis not present

## 2018-11-13 DIAGNOSIS — E11621 Type 2 diabetes mellitus with foot ulcer: Secondary | ICD-10-CM | POA: Diagnosis not present

## 2018-11-14 ENCOUNTER — Other Ambulatory Visit: Payer: Self-pay

## 2018-11-14 ENCOUNTER — Other Ambulatory Visit (INDEPENDENT_AMBULATORY_CARE_PROVIDER_SITE_OTHER): Payer: Medicare Other

## 2018-11-14 DIAGNOSIS — E1165 Type 2 diabetes mellitus with hyperglycemia: Secondary | ICD-10-CM

## 2018-11-14 DIAGNOSIS — N183 Chronic kidney disease, stage 3 (moderate): Secondary | ICD-10-CM

## 2018-11-14 DIAGNOSIS — E1122 Type 2 diabetes mellitus with diabetic chronic kidney disease: Secondary | ICD-10-CM | POA: Diagnosis not present

## 2018-11-14 DIAGNOSIS — Z794 Long term (current) use of insulin: Secondary | ICD-10-CM

## 2018-11-14 DIAGNOSIS — IMO0002 Reserved for concepts with insufficient information to code with codable children: Secondary | ICD-10-CM

## 2018-11-14 LAB — COMPREHENSIVE METABOLIC PANEL
ALT: 19 U/L (ref 0–53)
AST: 17 U/L (ref 0–37)
Albumin: 3.7 g/dL (ref 3.5–5.2)
Alkaline Phosphatase: 126 U/L — ABNORMAL HIGH (ref 39–117)
BUN: 23 mg/dL (ref 6–23)
CO2: 27 mEq/L (ref 19–32)
Calcium: 8.3 mg/dL — ABNORMAL LOW (ref 8.4–10.5)
Chloride: 103 mEq/L (ref 96–112)
Creatinine, Ser: 1.51 mg/dL — ABNORMAL HIGH (ref 0.40–1.50)
GFR: 55.77 mL/min — ABNORMAL LOW (ref >60.00)
Glucose, Bld: 231 mg/dL — ABNORMAL HIGH (ref 70–99)
Potassium: 4.3 mEq/L (ref 3.5–5.1)
Sodium: 136 mEq/L (ref 135–145)
Total Bilirubin: 0.4 mg/dL (ref 0.2–1.2)
Total Protein: 7 g/dL (ref 6.0–8.3)

## 2018-11-14 LAB — HEMOGLOBIN A1C: Hgb A1c MFr Bld: 10.5 % — ABNORMAL HIGH (ref 4.6–6.5)

## 2018-11-15 ENCOUNTER — Telehealth: Payer: Self-pay | Admitting: Dietician

## 2018-11-15 ENCOUNTER — Other Ambulatory Visit: Payer: Self-pay | Admitting: *Deleted

## 2018-11-15 DIAGNOSIS — I10 Essential (primary) hypertension: Secondary | ICD-10-CM

## 2018-11-15 MED ORDER — ENALAPRIL MALEATE 20 MG PO TABS: 40.0000 mg | ORAL_TABLET | Freq: Every day | ORAL | 1 refills | Status: DC

## 2018-11-15 NOTE — Telephone Encounter (Signed)
[name] called with questions about how to fill his Vgo20s. He got the Vgos back and forgot how to fill it and how many clicks with meals. I read him the information per Dr. Ronnie Derby instructions and translated it into clicks for [name]. He repeated back information demonstrating understanding. He asked about a correction amount of insulin; I referred him to Dr. Ronnie Derby office as I did not see directions for correction insulin.

## 2018-11-15 NOTE — Telephone Encounter (Signed)
Thank you Butch Penny. It should be a telehealth vist.Will forward to front desk to let him know.Thank you

## 2018-11-16 ENCOUNTER — Telehealth: Payer: Self-pay | Admitting: Endocrinology

## 2018-11-16 NOTE — Telephone Encounter (Signed)
Patient is returning call in regards to appointment, to give reading's.

## 2018-11-17 ENCOUNTER — Encounter: Payer: Self-pay | Admitting: Endocrinology

## 2018-11-17 ENCOUNTER — Ambulatory Visit (INDEPENDENT_AMBULATORY_CARE_PROVIDER_SITE_OTHER): Payer: Medicare Other | Admitting: Endocrinology

## 2018-11-17 DIAGNOSIS — E1165 Type 2 diabetes mellitus with hyperglycemia: Secondary | ICD-10-CM | POA: Diagnosis not present

## 2018-11-17 DIAGNOSIS — Z794 Long term (current) use of insulin: Secondary | ICD-10-CM | POA: Diagnosis not present

## 2018-11-17 NOTE — Progress Notes (Signed)
Patient ID: [name], male   DOB: [birth year], 68 y.o.   MRN: [id number]           Today's office visit was provided via telemedicine using audio technique, patient unable to establish video connection Explained to the patient and the the limitations of evaluation and management by telemedicine and the availability of in person appointments.  The patient understood the limitations and agreed to proceed. Patient also understood that the telehealth visit is billable. . Location of the patient: Home . Location of the provider: Office Only the patient and myself were participating in the encounter    Reason for Appointment: Follow-up for Type 2 Diabetes  Referring physician: Dareen Piano  History of Present Illness:          Date of diagnosis of type 2 diabetes mellitus: 2007       Background history:   He was diagnosed to have diabetes when he had an ulcer on his left third toe which led to amputation.  His glucose was apparently about 400 and he was started on insulin at that time He does not know if he has taken any oral hypoglycemic drugs in the past His A1c in 2016 has been consistently over 8% On his initial consultation was started on Victoza and subsequently started on U-500 insulin in 2/17 along with Toujeo INSULIN regimen previously was:  TOUJEO  74 in the morning and 74 in the evening HUMULIN U-500 insulin: 20/40 units in the morning, 60/70 at lunch and 70 at supper  Recent history:   Since 8/18 had been using V-go PUMP 20 unit basal, Humulin R U-500 insulin with boluses 6 units breakfast, 8 units acl  10 units  Until 11/15/2018 was on Humulin R injection, U-500: 6 units in the morning, 14 units before lunch and 18 before dinner  Non-insulin hypoglycemic drugs the patient is taking are: Victoza 1.8 mg daily        His A1c is last 10.5, previously 9%,  Current blood sugar patterns and problems identified:  He was not able to get supplies for his V-go pump in March  and has been taking only Humulin R injection  With this his blood sugars had been running persistently high over 200 frequently but he did not call to report this  He started the pump 2 days ago  With this his fasting blood sugars have improved and are near normal  However yesterday he still had readings over 200 after breakfast and dinner of over 200  Otherwise blood sugars appear to be relatively better with restarting the pump and the Humulin R U-500 with this  He has no difficulty using the pump now and doing his bolus clicks  Previously had occasionally missed boluses with meals or not able to take them consistently 30 minutes before eating   Glucose monitoring:  done 3-4  times a day         Glucometer:  freestyle libre 14-day     Blood Glucose readings by history as above: Patient unable to read off his blood sugar averages or patterns from his sensor reader     Self-care:  Typical meal intake: Breakfast is sometimes fruit or cereal, Sometimes eating sausage and grits Has been trying to avoid high-fat foods or fried foods  Breakfast 8 AM Lunch 12 noon Dinner at 5-6 pm He drinks cranberry or V-8 juice and diet drinks                Dietician  consultation: 1/19               Exercise: not much, usually has chronic problems with foot ulcer  Weight history: Previous range 260-410  Wt Readings from Last 3 Encounters:  08/23/18 (!) 393 lb 11.2 oz (178.6 kg)  08/18/18 (!) 397 lb (180.1 kg)  05/27/18 (!) 385 lb 1.6 oz (174.7 kg)    Glycemic control:   Lab Results  Component Value Date   HGBA1C 10.5 (H) 11/14/2018   HGBA1C 9.0 (H) 08/12/2018   HGBA1C 8.3 (H) 05/13/2018   Lab Results  Component Value Date   MICROALBUR 29.8 (H) 07/22/2017   LDLCALC 55 05/13/2018   CREATININE 1.51 (H) 11/14/2018      OTHER problems review today: See review of systems     Lab on 11/14/2018  Component Date Value Ref Range Status  . Sodium 11/14/2018 136  135 - 145 mEq/L  Final  . Potassium 11/14/2018 4.3  3.5 - 5.1 mEq/L Final  . Chloride 11/14/2018 103  96 - 112 mEq/L Final  . CO2 11/14/2018 27  19 - 32 mEq/L Final  . Glucose, Bld 11/14/2018 231* 70 - 99 mg/dL Final  . BUN 11/14/2018 23  6 - 23 mg/dL Final  . Creatinine, Ser 11/14/2018 1.51* 0.40 - 1.50 mg/dL Final  . Total Bilirubin 11/14/2018 0.4  0.2 - 1.2 mg/dL Final  . Alkaline Phosphatase 11/14/2018 126* 39 - 117 U/L Final  . AST 11/14/2018 17  0 - 37 U/L Final  . ALT 11/14/2018 19  0 - 53 U/L Final  . Total Protein 11/14/2018 7.0  6.0 - 8.3 g/dL Final  . Albumin 11/14/2018 3.7  3.5 - 5.2 g/dL Final  . Calcium 11/14/2018 8.3* 8.4 - 10.5 mg/dL Final  . GFR 11/14/2018 55.77* >60.00 mL/min Final  . Hgb A1c MFr Bld 11/14/2018 10.5* 4.6 - 6.5 % Final   Glycemic Control Guidelines for People with Diabetes:Non Diabetic:  <6%Goal of Therapy: <7%Additional Action Suggested:  >8%        Allergies as of 11/17/2018      Reactions   Vancomycin    REACTION: ARF      Medication List       Accurate as of Nov 17, 2018 10:37 AM. If you have any questions, ask your nurse or doctor.        Accu-Chek Aviva Plus w/Device Kit CHECK BLOOD SUGAR 5 TIMES A DAY   Accu-Chek FastClix Lancets Misc Check blood sugar 5 times a day   albuterol 108 (90 Base) MCG/ACT inhaler Commonly known as:  ProAir HFA Inhale 2 puffs into the lungs every 6 (six) hours as needed for wheezing or shortness of breath.   amLODipine 10 MG tablet Commonly known as:  NORVASC Take 1 tablet (10 mg total) by mouth daily.   atorvastatin 20 MG tablet Commonly known as:  LIPITOR TAKE 1 TABLET(20 MG) BY MOUTH DAILY   Azopt 1 % ophthalmic suspension Generic drug:  brinzolamide Place 1 drop into both eyes 2 (two) times daily.   becaplermin 0.01 % gel Commonly known as:  Regranex Apply 1 application topically daily.   cholecalciferol 25 MCG (1000 UT) tablet Commonly known as:  VITAMIN D Take 2 tablets (2,000 Units total) by mouth  daily.   diclofenac sodium 1 % Gel Commonly known as:  VOLTAREN Apply 4 g topically 4 (four) times daily.   docusate sodium 100 MG capsule Commonly known as:  Colace Take 1 capsule (100 mg total)  by mouth daily as needed for mild constipation.   Eliquis 5 MG Tabs tablet Generic drug:  apixaban TAKE 1 TABLET(5 MG) BY MOUTH TWICE DAILY   enalapril 20 MG tablet Commonly known as:  VASOTEC Take 2 tablets (40 mg total) by mouth daily.   folic acid 1 MG tablet Commonly known as:  FOLVITE Take 1 tablet (1 mg total) by mouth daily.   FreeStyle Libre 14 Day Sensor Misc 1 each by Does not apply route 6 (six) times daily.   furosemide 20 MG tablet Commonly known as:  LASIX TAKE 1 TABLET BY MOUTH DAILY. TAKE ADDITIONAL DOSE IF LEGS ARE SWOLLEN   gabapentin 300 MG capsule Commonly known as:  NEURONTIN Take 1 capsule (300 mg total) by mouth at bedtime.   glucose blood test strip Commonly known as:  FreeStyle Precision Neo Test 1 each by Other route as needed for other. Use as instructed to check blood sugar 2 times daily.   glucose blood test strip Commonly known as:  Accu-Chek Aviva Plus USE TO CHECK BLOOD FIVE TIMES DAILY   HYDROcodone-acetaminophen 7.5-325 MG tablet Commonly known as:  NORCO Take 1 tablet by mouth every 8 (eight) hours as needed for moderate pain or severe pain.   insulin regular human CONCENTRATED 500 UNIT/ML kwikpen Commonly known as:  HumuLIN R U-500 KwikPen Inject 6 units under the skin in the morning before breakfast, 14 units before lunch, and 18 units before dinner.   latanoprost 0.005 % ophthalmic solution Commonly known as:  XALATAN INSTILL 1 DROP IN BOTH EYES AT BEDTIME   metoprolol succinate 25 MG 24 hr tablet Commonly known as:  TOPROL-XL TAKE 1 TABLET(25 MG) BY MOUTH DAILY   multivitamin with minerals Tabs tablet Take 1 tablet by mouth daily.   promethazine 25 MG tablet Commonly known as:  PHENERGAN TAKE 1 TABLET BY MOUTH EVERY 8 HOURS  AS NEEDED FOR NAUSEA OR VOMITING   silver sulfADIAZINE 1 % cream Commonly known as:  Silvadene Apply 1 application topically daily.   Simbrinza 1-0.2 % Susp Generic drug:  Brinzolamide-Brimonidine Place 1 drop into both eyes 2 (two) times daily.   thiamine 100 MG tablet Take 1 tablet (100 mg total) by mouth daily.   V-Go 20 Kit USE 1 PUMP DAILY WITH INSULIN   Victoza 18 MG/3ML Sopn Generic drug:  liraglutide INJECT 1.8 MG ONCE DAILY AT THE SAME TIME       Allergies:  Allergies  Allergen Reactions  . Vancomycin     REACTION: ARF    Past Medical History:  Diagnosis Date  . Arthritis    "elbows & knees" (12/13/2014)  . Asthma   . CKD (chronic kidney disease), stage III ([place])   . DIABETIC FOOT ULCER 06/20/2009  . Edema, macular, due to secondary diabetes ([place])   . Erectile dysfunction   . GERD (gastroesophageal reflux disease)   . HEARING LOSS, SENSORINEURAL, BILATERAL 01/03/2007   Seen by ENT Dr. Orpah Greek D. Redmond Baseman 01/03/07  . Hemorrhoids   . History of echocardiogram    a. 04/2008 Echo: EF 50-55%, abnl LV relaxation, mildly dil LA.  Marland Kitchen Hyperlipidemia   . Hypertension   . Morbid obesity ([place])   . Neuropathy, lower extremity   . OSA (obstructive sleep apnea)    uses CPAP nightly  . OSA on CPAP    Nocturnal polysomnogram on 01/21/2010 showed severe obstructive sleep apnea/hypopnea syndrome, AHI 74.1 per hour with non positional events, moderately loud snoring, and oxygen desaturation to a  nadir of 78% on room air.  CPAP was successfully titrated to 17 CWP, AHI 1.1 per hour using a large ResMed Mirage Quattro full-face mask with heated humidifier. Bruxism was noted.   . Osteomyelitis of ankle and foot ([place])   . Retinopathy   . Type II diabetes mellitus ([facility])    w/complication NOS, type II    Past Surgical History:  Procedure Laterality Date  . ACHILLES TENDON LENGTHENING Left 03/21/2018   Procedure: ACHILLES TENDON LENGTHENING;  Surgeon: Erle Crocker, MD;   Location: [facility];  Service: Orthopedics;  Laterality: Left;  . AMPUTATION Left 12/15/2014   Procedure: LEFT SECOND TOE AMPUTATION ;  Surgeon: Dorna Leitz, MD;  Location: [facility];  Service: Orthopedics;  Laterality: Left;  . AMPUTATION Left 03/21/2018   Procedure: TRANSMETATARSAL AMPUTATION LEFT FOOT;  Surgeon: Erle Crocker, MD;  Location: [facility];  Service: Orthopedics;  Laterality: Left;  Popliteal & ankle block  . 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 Kathalene Frames. Mayer Camel.  . WOUND DEBRIDEMENT Left 06/15/2016   Procedure: DEBRIDEMENT WOUND LEFT FOOT WITH GRAFT APPLICATION;  Surgeon: Landis Martins, DPM;  Location: [facility];  Service: Podiatry;  Laterality: Left;    Family History  Problem Relation Age of Onset  . Diabetes Mother        also 2 siblings  . Heart attack Father 12  . Throat cancer Brother     Social History:  reports that he quit smoking about 8 years ago. His smoking use included cigarettes. He has a 15.00 pack-year smoking history. He has never used smokeless tobacco. He reports current alcohol use of about 11.0 standard drinks of alcohol per week. He reports that he does not use drugs.    Review of Systems     Lipid management: taking Lipitor 20 mg, followed by PCP Also has low HDL    Lab Results  Component Value Date   CHOL 114 05/13/2018   HDL 29.00 (L) 05/13/2018   LDLCALC 55 05/13/2018   TRIG 146.0 05/13/2018   CHOLHDL 4 05/13/2018            Hypertension: Has had hypertension for a few years treated with enalapril,  taking 20 mg   Also on Lasix and 25 mg metoprolol, and  taking amlodipine 10 mg  This is treated by PCP/cardiologist.  Also followed by home blood pressure readings  BP Readings from Last 3 Encounters:  08/23/18 137/64  08/18/18 (!) 156/72  05/27/18 (!) 132/56    He  has chronic renal dysfunction, previously seen by nephrologist  Lab  Results  Component Value Date   CREATININE 1.51 (H) 11/14/2018   CREATININE 1.75 (H) 08/12/2018   CREATININE 1.95 (H) 05/13/2018       Physical Examination:  There were no vitals taken for this visit.     ASSESSMENT:  Diabetes type 2, with morbid obesity   See history of present illness for detailed discussion of current diabetes management, blood sugar patterns and problems identified  He was on insulin using V-go pump and using U-500 insulin.  Also on Victoza  Over the last 2 months he has not been able to get the pump and blood sugars are significantly higher until a day or so ago when he restarted his pump  His blood sugars are now not as well controlled and A1c has gone up to 10.5 He has only 1 full  day of blood sugars with his pump and difficult to get a pattern He is still having some high readings after meals but also requiring a little more bolus insulin compared to before  Unable to check his weight today  PLAN:    He will need at least 1 more click for evening meal when he is eating significant amount of carbohydrate He knows how to use his pump and change it at the same time daily Also reminded him that he needs to bolus 30 minutes before eating For now he will continue the same basic dose of 3 clicks before breakfast, 4 at lunch and 5-6 at dinnertime He will report any inconsistency in blood sugar patterns prior to his next visit  There are no Patient Instructions on file for this visit.   Duration of phone call encounter today was 7 minutes  Elayne Snare 11/17/2018, 10:37 AM   Note: This office note was prepared with Dragon voice recognition system technology. Any transcriptional errors that result from this process are unintentional.

## 2018-11-17 NOTE — Telephone Encounter (Signed)
Called pt back and completed pre rooming process for today's visit.

## 2018-11-22 ENCOUNTER — Ambulatory Visit (INDEPENDENT_AMBULATORY_CARE_PROVIDER_SITE_OTHER): Payer: Medicare Other | Admitting: Internal Medicine

## 2018-11-22 ENCOUNTER — Other Ambulatory Visit: Payer: Self-pay

## 2018-11-22 DIAGNOSIS — N183 Chronic kidney disease, stage 3 unspecified: Secondary | ICD-10-CM

## 2018-11-22 DIAGNOSIS — F321 Major depressive disorder, single episode, moderate: Secondary | ICD-10-CM

## 2018-11-22 DIAGNOSIS — G894 Chronic pain syndrome: Secondary | ICD-10-CM

## 2018-11-22 DIAGNOSIS — I480 Paroxysmal atrial fibrillation: Secondary | ICD-10-CM | POA: Diagnosis not present

## 2018-11-22 DIAGNOSIS — I10 Essential (primary) hypertension: Secondary | ICD-10-CM | POA: Diagnosis not present

## 2018-11-22 DIAGNOSIS — E11649 Type 2 diabetes mellitus with hypoglycemia without coma: Secondary | ICD-10-CM

## 2018-11-22 DIAGNOSIS — R110 Nausea: Secondary | ICD-10-CM

## 2018-11-22 MED ORDER — OLOPATADINE HCL 0.7 % OP SOLN: 1.0000 [drp] | Freq: Every day | OPHTHALMIC | 3 refills | Status: DC

## 2018-11-22 NOTE — Assessment & Plan Note (Signed)
-  This problem is chronic and stable -Patient states that his pressures at home have been running in systolics 997F to 414E.  His blood pressure today was 152/92 -This is above his goal but we will continue with his current regimen for now and reevaluate this at his next in-person visit -Continue with enalapril 40 mg daily, Toprol-XL 25 mg, amlodipine 10 mg and Lasix 20 mg daily -No further work-up at this time

## 2018-11-22 NOTE — Assessment & Plan Note (Signed)
-  This problem is chronic and stable -Patient states that his pain is well controlled on hydrocodone -He is scheduled for follow-up appointment with his pain clinic in June -He denies any other complaints at this time -No further work-up for now

## 2018-11-22 NOTE — Assessment & Plan Note (Signed)
-  This problem is chronic and stable -Patient states that occasionally he does feel "queasy" and when this happens he does take Phenergan for his nausea -He states he takes approximately 2 to 3 pills a week and does not use this daily -We will continue with Phenergan as needed for now and attempt to wean him off completely

## 2018-11-22 NOTE — Assessment & Plan Note (Signed)
-  This problem is chronic and stable -Patient states that he did follow-up with nephrology and that he is scheduled to follow-up with them every 6 months for follow-up on his CKD -He denies any urinary complaints at this time -No further work-up at this time -We will check a BMP at his follow-up visit

## 2018-11-22 NOTE — Assessment & Plan Note (Signed)
-  This problem is chronic and worsening -Patient states that over the last 2 months he had run out of the supplies for his [place] pump and as a result his blood pressures were consistently in the 200s to 300s -A1c done last week was elevated at 10.5 -Patient follows up with endocrine Dr. Dwyane Dee for this and his note is appreciated -Patient states that over the last week he is gotten supplies for his pump and as a result his blood sugars are much improved and are running in the 100s with the highest being 235 in the last week -We will continue with his current regimen for now including Victoza 1.8 mg and insulin regimen per Dr. Dwyane Dee -No further work-up at this time

## 2018-11-22 NOTE — Assessment & Plan Note (Signed)
-  This problem is chronic and stable -Patient states that he has been having telephone visit with aspirin and these have been helpful for him -He states that his mood is much improved currently and he does not require any medications for this -No further work-up at this time

## 2018-11-22 NOTE — Progress Notes (Signed)
   Subjective:    Patient ID: [name], male    DOB: 1951-04-16, 68 y.o.   MRN: [id number]  This is a telephone encounter between OAK DOREY and Aldine Contes on 11/22/2018 for hypertension and diabetes. The visit was conducted with the patient located at home and Aldine Contes at [facility]. The patient's identity was confirmed using their DOB and current address. The patient has consented to being evaluated through a telephone encounter and understands the associated risks (an examination cannot be done and the patient may need to come in for an appointment) / benefits (allows the patient to remain at home, decreasing exposure to coronavirus). I personally spent 17 minutes on medical discussion.   HPI  This is a telephone visit for the patient for follow-up of his hypertension and diabetes.  Patient states that he ran out of the supplies for his Vigo pump I was unable to get them for a couple of months and his sugars as a result were persistently high.  He did have an A1c done last week which was 10.5.  He states that he now has supplies for his pump and that his blood sugars are much improved.  He denies any other complaints at this time and states that he is compliant with all his medications.  Review of Systems  Constitutional: Negative.   HENT: Negative.   Cardiovascular: Negative.   Gastrointestinal: Negative.   Musculoskeletal: Negative.   Neurological: Negative.   Psychiatric/Behavioral: Negative.        Objective:   Physical Exam   This is a telephone encounter and as such vitals have not been recorded and Physical exam was not done     Assessment & Plan:   please see problem based charting for assessment and plan:

## 2018-11-22 NOTE — Assessment & Plan Note (Signed)
-  This problem is chronic and stable -Patient denies any palpitations or lightheadedness -Continue with Toprol-XL 25 mg daily as well as apixaban for anticoagulation -No further work-up at this time

## 2018-11-23 ENCOUNTER — Telehealth: Payer: Self-pay | Admitting: Pharmacist

## 2018-11-23 NOTE — Progress Notes (Addendum)
[name] is a 68 y.o. male who was contacted on behalf of [facility] Geriatrics Task Force.   Diabetes Assessment  DM meds and BS checks -  "What medications are you taking for diabetes?" Victoza 1.8 mg daily, VGo20 (U-500 insulin), humulin U-500 2-7 units per meal depending on BG -  "How often do you check your blood sugars at home?" ~3 times daily using Freestyle Libre - "What have your blood sugars been?" 100s (none < 70; none > 300, few > 200) no signs or symptoms of concern  High Blood Pressure Assessment  BP meds & BP checks -  "What medications are you taking for high blood pressure?" enalapril, furosemide, metoprolol (reports not currently taking amlodipine) - "How often do you check your blood pressure at home?" daily - "What have your blood pressure readings been?" 120s-140s/70s-80s, amlodipine missing, patient states he may have been incorrectly notifying care team that he takes amlodipine, thinking it was the same as enalapril. He clarifies he is not currently taking amlodipine. [place] records do not have amlodipine on file.   Coping with DM and BP - What else are you doing to help with your DM and BP - working on diet/exercise, but following with Debera Lat and endocrinology  Medication Access Issues  Medication Issues? -  "Are you getting your medicines refilled on time without skipping any doses?" yes - "Are you having any problems getting/taking your meds (cost, timing, transportation)?" no - Do you need any meds refilled? no  Conclusion  Close the call - Follow up visit planned next month - "Any other questions or concerns?" need to clarify whether to take amlodipine

## 2018-11-29 ENCOUNTER — Other Ambulatory Visit: Payer: Self-pay | Admitting: Endocrinology

## 2018-12-02 ENCOUNTER — Other Ambulatory Visit: Payer: Self-pay | Admitting: Internal Medicine

## 2018-12-02 NOTE — Addendum Note (Signed)
Addended by: Forde Dandy on: 12/02/2018 11:17 AM   Modules accepted: Orders

## 2018-12-06 DIAGNOSIS — G4733 Obstructive sleep apnea (adult) (pediatric): Secondary | ICD-10-CM | POA: Diagnosis not present

## 2018-12-07 NOTE — Telephone Encounter (Signed)
This needs to be refilled by his opthalmologist

## 2018-12-08 NOTE — Telephone Encounter (Signed)
Received another refill request for Latanoprost. Called pt - stated he does not need a refill; he has 2 bottles.  Pharmacy informed.

## 2018-12-12 ENCOUNTER — Other Ambulatory Visit: Payer: Self-pay | Admitting: Endocrinology

## 2018-12-15 ENCOUNTER — Other Ambulatory Visit: Payer: Self-pay | Admitting: Gastroenterology

## 2018-12-16 DIAGNOSIS — M79672 Pain in left foot: Secondary | ICD-10-CM | POA: Diagnosis not present

## 2018-12-16 DIAGNOSIS — M545 Low back pain: Secondary | ICD-10-CM | POA: Diagnosis not present

## 2018-12-16 DIAGNOSIS — Z79891 Long term (current) use of opiate analgesic: Secondary | ICD-10-CM | POA: Diagnosis not present

## 2018-12-16 DIAGNOSIS — G894 Chronic pain syndrome: Secondary | ICD-10-CM | POA: Diagnosis not present

## 2018-12-19 ENCOUNTER — Telehealth: Payer: Self-pay | Admitting: *Deleted

## 2018-12-19 ENCOUNTER — Other Ambulatory Visit: Payer: Self-pay | Admitting: Endocrinology

## 2018-12-19 DIAGNOSIS — E11649 Type 2 diabetes mellitus with hypoglycemia without coma: Secondary | ICD-10-CM

## 2018-12-19 MED ORDER — FREESTYLE LIBRE 14 DAY SENSOR MISC: 1.0000 | Freq: Every day | 12 refills | Status: AC

## 2018-12-19 MED ORDER — VICTOZA 18 MG/3ML ~~LOC~~ SOPN: PEN_INJECTOR | SUBCUTANEOUS | 0 refills | Status: DC

## 2018-12-19 NOTE — Telephone Encounter (Signed)
Call from patient requesting refills on Free-Style Libre and Victoza to be sent to [facility] in [place].  Message to be sent to Dr. Dareen Piano and Triage.  [name], RN 12/19/2018 1:41 PM.

## 2018-12-19 NOTE — Telephone Encounter (Signed)
ordered

## 2018-12-20 ENCOUNTER — Telehealth: Payer: Self-pay | Admitting: *Deleted

## 2018-12-20 NOTE — Telephone Encounter (Signed)
Call from patient stated that information needed to be sent to [facility] to facilitate getting his CGM.  Call to [facility] at [phone].  Spoke with representative there who stated that last office visit notes are needed to process Claim for the CGM.  Information was faxed to [facility] at [phone].  [name], RN 12/20/2018 2:15 PM.

## 2018-12-22 DIAGNOSIS — E11621 Type 2 diabetes mellitus with foot ulcer: Secondary | ICD-10-CM | POA: Diagnosis not present

## 2018-12-22 DIAGNOSIS — L97521 Non-pressure chronic ulcer of other part of left foot limited to breakdown of skin: Secondary | ICD-10-CM | POA: Diagnosis not present

## 2018-12-22 DIAGNOSIS — E119 Type 2 diabetes mellitus without complications: Secondary | ICD-10-CM | POA: Diagnosis not present

## 2018-12-22 DIAGNOSIS — Z794 Long term (current) use of insulin: Secondary | ICD-10-CM | POA: Diagnosis not present

## 2018-12-26 ENCOUNTER — Other Ambulatory Visit (INDEPENDENT_AMBULATORY_CARE_PROVIDER_SITE_OTHER): Payer: Medicare Other

## 2018-12-26 ENCOUNTER — Other Ambulatory Visit: Payer: Self-pay

## 2018-12-26 DIAGNOSIS — E1165 Type 2 diabetes mellitus with hyperglycemia: Secondary | ICD-10-CM | POA: Diagnosis not present

## 2018-12-26 DIAGNOSIS — Z794 Long term (current) use of insulin: Secondary | ICD-10-CM

## 2018-12-26 LAB — BASIC METABOLIC PANEL
BUN: 35 mg/dL — ABNORMAL HIGH (ref 6–23)
CO2: 26 mEq/L (ref 19–32)
Calcium: 8.6 mg/dL (ref 8.4–10.5)
Chloride: 104 mEq/L (ref 96–112)
Creatinine, Ser: 1.94 mg/dL — ABNORMAL HIGH (ref 0.40–1.50)
GFR: 41.75 mL/min — ABNORMAL LOW (ref >60.00)
Glucose, Bld: 185 mg/dL — ABNORMAL HIGH (ref 70–99)
Potassium: 4.5 mEq/L (ref 3.5–5.1)
Sodium: 138 mEq/L (ref 135–145)

## 2018-12-26 LAB — MICROALBUMIN / CREATININE URINE RATIO
Creatinine,U: 74.9 mg/dL
Microalb Creat Ratio: 43.7 mg/g — ABNORMAL HIGH (ref 0.0–30.0)
Microalb, Ur: 32.7 mg/dL — ABNORMAL HIGH (ref 0.0–1.9)

## 2018-12-27 LAB — FRUCTOSAMINE: Fructosamine: 272 umol/L (ref 0–285)

## 2018-12-27 NOTE — Progress Notes (Signed)
Patient ID: [name], male   DOB: 1951/05/02, 68 y.o.   MRN: [id number]             Reason for Appointment: Follow-up for Type 2 Diabetes  Referring physician: Dareen Piano  History of Present Illness:          Date of diagnosis of type 2 diabetes mellitus: 2007       Background history:   He was diagnosed to have diabetes when he had an ulcer on his left third toe which led to amputation.  His glucose was apparently about 400 and he was started on insulin at that time He does not know if he has taken any oral hypoglycemic drugs in the past His A1c in 2016 has been consistently over 8% On his initial consultation was started on Victoza and subsequently started on U-500 insulin in 2/17 along with Toujeo INSULIN regimen previously was:  TOUJEO  74 in the morning and 74 in the evening HUMULIN U-500 insulin: 20/40 units in the morning, 60/70 at lunch and 70 at supper  Recent history:   Since 8/18 had been using V-go PUMP 20 unit basal, Humulin R U-500 insulin with boluses 4 units breakfast, 8 units acl, 10-12 units at dinner  Non-insulin hypoglycemic drugs the patient is taking are: Victoza 1.8 mg daily        His A1c is last 10.5, previously 9%, fructosamine is now 272  Current blood sugar patterns and problems identified:  He has been using the V-go pump but he now says that he has sometimes difficulty with the pump sticking until a few days ago  Although his blood sugars are better as seen on the interpretation of his CGM below he still has some postprandial hyperglycemia  Currently does not have much understanding about how to do his bolus clicks  Mostly doing his boluses based on blood sugar reading before eating Because of this he tends to have periodic excessive postprandial hyperglycemia depending on what he is eating Yesterday with eating chicken noodles soup with rice his blood sugar went up significantly  Also again not always taking the bolus 30 minutes  before eating  His weight is about the same   Glucose monitoring:  done 3-4  times a day         Glucometer:  freestyle libre 14-day    CONTINUOUS GLUCOSE MONITORING RECORD INTERPRETATION    Dates of Recording: 6/2 through 6/17  Sensor description: [employer]  Results statistics:   CGM use % of time  86  Average and SD 154  Time in range     65   %  % Time Above 180  23  % Time above 250  7  % Time Below target  5    Glycemic patterns summary: His blood sugars are excellent through the night but rising to a variable extent after his meals after about 12 noon Has rare hypoglycemia overnight  Hyperglycemic episodes are occurring primarily after lunch or dinner meal with no consistency  Hypoglycemic episodes occurred significantly only on 6/10 during the night when it was repeatedly low  Overnight periods: His blood sugars are somewhat variable and depend partly on what blood sugars are at bedtime but after about 3 AM blood sugars are fairly stable with an average of about 110-120  Preprandial periods: Blood sugars are near normal in the mornings, moderately higher at lunch and also at dinnertime as seen in the average below.  Has significant variability at  lunch and dinner  Postprandial periods:   After breakfast:   Blood sugars are not rising until about 11 AM but not clear what time he is eating breakfast if any  After lunch:   glucose is rising variably after his midday meal and may be as much as 291  Blood sugars after dinner are rising moderately but usually not much over the pre-meal reading    PRE-MEAL Fasting  AC/PC lunch  AC/PC S Bedtime Overall  Glucose range:       Mean/median:  114  127/197   171/210  191       Self-care:  Typical meal intake: Breakfast is sometimes fruit like banana or cereal, Sometimes eating sausage and grits Usually trying to avoid high-fat foods or fried foods  Breakfast 8 AM Lunch 12 noon Dinner at 5-6 pm He drinks  cranberry or V-8 juice and diet drinks                Dietician consultation: 1/19               Exercise:  None, usually has chronic problems with foot ulcer  Weight history: Previous range 260-410  Wt Readings from Last 3 Encounters:  12/28/18 (!) 398 lb 6.4 oz (180.7 kg)  08/23/18 (!) 393 lb 11.2 oz (178.6 kg)  08/18/18 (!) 397 lb (180.1 kg)    Glycemic control:   Lab Results  Component Value Date   HGBA1C 10.5 (H) 11/14/2018   HGBA1C 9.0 (H) 08/12/2018   HGBA1C 8.3 (H) 05/13/2018   Lab Results  Component Value Date   MICROALBUR 32.7 (H) 12/26/2018   LDLCALC 55 05/13/2018   CREATININE 1.94 (H) 12/26/2018     OTHER problems review today: See review of systems     Lab on 12/26/2018  Component Date Value Ref Range Status  . Microalb, Ur 12/26/2018 32.7* 0.0 - 1.9 mg/dL Final  . Creatinine,U 12/26/2018 74.9  mg/dL Final  . Microalb Creat Ratio 12/26/2018 43.7* 0.0 - 30.0 mg/g Final  . Fructosamine 12/26/2018 272  0 - 285 umol/L Final   Comment: Published reference interval for apparently healthy subjects between age 33 and 30 is 32 - 285 umol/L and in a poorly controlled diabetic population is 228 - 563 umol/L with a mean of 396 umol/L.   Marland Kitchen Sodium 12/26/2018 138  135 - 145 mEq/L Final  . Potassium 12/26/2018 4.5  3.5 - 5.1 mEq/L Final  . Chloride 12/26/2018 104  96 - 112 mEq/L Final  . CO2 12/26/2018 26  19 - 32 mEq/L Final  . Glucose, Bld 12/26/2018 185* 70 - 99 mg/dL Final  . BUN 12/26/2018 35* 6 - 23 mg/dL Final  . Creatinine, Ser 12/26/2018 1.94* 0.40 - 1.50 mg/dL Final  . Calcium 12/26/2018 8.6  8.4 - 10.5 mg/dL Final  . GFR 12/26/2018 41.75* >60.00 mL/min Final       Allergies as of 12/28/2018      Reactions   Vancomycin    REACTION: ARF      Medication List       Accurate as of December 28, 2018 11:03 AM. If you have any questions, ask your nurse or doctor.        Accu-Chek Aviva Plus w/Device Kit CHECK BLOOD SUGAR 5 TIMES A DAY    Accu-Chek FastClix Lancets Misc Check blood sugar 5 times a day   albuterol 108 (90 Base) MCG/ACT inhaler Commonly known as: ProAir HFA Inhale 2 puffs into the lungs every  6 (six) hours as needed for wheezing or shortness of breath.   atorvastatin 20 MG tablet Commonly known as: LIPITOR TAKE 1 TABLET(20 MG) BY MOUTH DAILY   Azopt 1 % ophthalmic suspension Generic drug: brinzolamide Place 1 drop into both eyes 2 (two) times daily.   becaplermin 0.01 % gel Commonly known as: Regranex Apply 1 application topically daily.   cholecalciferol 25 MCG (1000 UT) tablet Commonly known as: VITAMIN D Take 2 tablets (2,000 Units total) by mouth daily.   diclofenac sodium 1 % Gel Commonly known as: VOLTAREN Apply 4 g topically 4 (four) times daily.   docusate sodium 100 MG capsule Commonly known as: Colace Take 1 capsule (100 mg total) by mouth daily as needed for mild constipation.   Eliquis 5 MG Tabs tablet Generic drug: apixaban TAKE 1 TABLET(5 MG) BY MOUTH TWICE DAILY   enalapril 20 MG tablet Commonly known as: VASOTEC Take 2 tablets (40 mg total) by mouth daily.   folic acid 1 MG tablet Commonly known as: FOLVITE Take 1 tablet (1 mg total) by mouth daily.   FreeStyle Libre 14 Day Sensor Misc 1 each by Does not apply route 6 (six) times daily.   furosemide 20 MG tablet Commonly known as: LASIX TAKE 1 TABLET BY MOUTH DAILY. TAKE ADDITIONAL DOSE IF LEGS ARE SWOLLEN   gabapentin 300 MG capsule Commonly known as: NEURONTIN Take 1 capsule (300 mg total) by mouth at bedtime.   glucose blood test strip Commonly known as: FreeStyle Precision Neo Test 1 each by Other route as needed for other. Use as instructed to check blood sugar 2 times daily.   glucose blood test strip Commonly known as: Accu-Chek Aviva Plus USE TO CHECK BLOOD FIVE TIMES DAILY   HYDROcodone-acetaminophen 7.5-325 MG tablet Commonly known as: NORCO Take 1 tablet by mouth every 8 (eight) hours as needed  for moderate pain or severe pain.   insulin regular human CONCENTRATED 500 UNIT/ML kwikpen Commonly known as: HumuLIN R U-500 KwikPen Inject 6 units under the skin in the morning before breakfast, 14 units before lunch, and 18 units before dinner.   latanoprost 0.005 % ophthalmic solution Commonly known as: XALATAN INSTILL 1 DROP IN BOTH EYES AT BEDTIME   metoprolol succinate 25 MG 24 hr tablet Commonly known as: TOPROL-XL TAKE 1 TABLET(25 MG) BY MOUTH DAILY   multivitamin with minerals Tabs tablet Take 1 tablet by mouth daily.   Olopatadine HCl 0.7 % Soln Commonly known as: Pazeo Apply 1 drop to eye daily.   promethazine 25 MG tablet Commonly known as: PHENERGAN TAKE 1 TABLET BY MOUTH EVERY 8 HOURS AS NEEDED FOR NAUSEA OR VOMITING   silver sulfADIAZINE 1 % cream Commonly known as: Silvadene Apply 1 application topically daily.   Simbrinza 1-0.2 % Susp Generic drug: Brinzolamide-Brimonidine Place 1 drop into both eyes 2 (two) times daily.   thiamine 100 MG tablet Take 1 tablet (100 mg total) by mouth daily.   V-Go 20 Kit USE DAILY WITH INSULIN   Victoza 18 MG/3ML Sopn Generic drug: liraglutide ADMINISTER 1.8 MG UNDER THE SKIN AT THE SAME TIME EVERY DAY       Allergies:  Allergies  Allergen Reactions  . Vancomycin     REACTION: ARF    Past Medical History:  Diagnosis Date  . Arthritis    "elbows & knees" (12/13/2014)  . Asthma   . CKD (chronic kidney disease), stage III ([place])   . DIABETIC FOOT ULCER 06/20/2009  . Edema, macular,  due to secondary diabetes ([place])   . Erectile dysfunction   . GERD (gastroesophageal reflux disease)   . HEARING LOSS, SENSORINEURAL, BILATERAL 01/03/2007   Seen by ENT Dr. Orpah Greek D. Redmond Baseman 01/03/07  . Hemorrhoids   . History of echocardiogram    a. 04/2008 Echo: EF 50-55%, abnl LV relaxation, mildly dil LA.  Marland Kitchen Hyperlipidemia   . Hypertension   . Morbid obesity ([place])   . Neuropathy, lower extremity   . OSA (obstructive sleep  apnea)    uses CPAP nightly  . OSA on CPAP    Nocturnal polysomnogram on 01/21/2010 showed severe obstructive sleep apnea/hypopnea syndrome, AHI 74.1 per hour with non positional events, moderately loud snoring, and oxygen desaturation to a nadir of 78% on room air.  CPAP was successfully titrated to 17 CWP, AHI 1.1 per hour using a large ResMed Mirage Quattro full-face mask with heated humidifier. Bruxism was noted.   . Osteomyelitis of ankle and foot ([place])   . Retinopathy   . Type II diabetes mellitus (HCC)    w/complication NOS, type II    Past Surgical History:  Procedure Laterality Date  . ACHILLES TENDON LENGTHENING Left 03/21/2018   Procedure: ACHILLES TENDON LENGTHENING;  Surgeon: Erle Crocker, MD;  Location: [facility];  Service: Orthopedics;  Laterality: Left;  . AMPUTATION Left 12/15/2014   Procedure: LEFT SECOND TOE AMPUTATION ;  Surgeon: Dorna Leitz, MD;  Location: [facility];  Service: Orthopedics;  Laterality: Left;  . AMPUTATION Left 03/21/2018   Procedure: TRANSMETATARSAL AMPUTATION LEFT FOOT;  Surgeon: Erle Crocker, MD;  Location: [facility];  Service: Orthopedics;  Laterality: Left;  Popliteal & ankle block  . 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 Kathalene Frames. Mayer Camel.  . WOUND DEBRIDEMENT Left 06/15/2016   Procedure: DEBRIDEMENT WOUND LEFT FOOT WITH GRAFT APPLICATION;  Surgeon: Landis Martins, DPM;  Location: [facility];  Service: Podiatry;  Laterality: Left;    Family History  Problem Relation Age of Onset  . Diabetes Mother        also 2 siblings  . Heart attack Father 36  . Throat cancer Brother     Social History:  reports that he quit smoking about 8 years ago. His smoking use included cigarettes. He has a 15.00 pack-year smoking history. He has never used smokeless tobacco. He reports current alcohol use of about 11.0 standard drinks of alcohol per week. He reports that  he does not use drugs.    Review of Systems     Lipid management: taking Lipitor 20 mg, followed by PCP Also has low HDL    Lab Results  Component Value Date   CHOL 114 05/13/2018   HDL 29.00 (L) 05/13/2018   LDLCALC 55 05/13/2018   TRIG 146.0 05/13/2018   CHOLHDL 4 05/13/2018            Hypertension: Has had hypertension for a few years treated with enalapril,  taking 20 mg   Also on Lasix and 25 mg metoprolol, and  taking amlodipine 10 mg  This is treated by PCP/cardiologist.  Also followed by home blood pressure readings  BP Readings from Last 3 Encounters:  12/28/18 (!) 142/62  08/23/18 137/64  08/18/18 (!) 156/72    He  has chronic renal dysfunction, previously seen by nephrologist  Lab Results  Component Value Date   CREATININE 1.94 (H) 12/26/2018   CREATININE 1.51 (H) 11/14/2018  CREATININE 1.75 (H) 08/12/2018       Physical Examination:  BP (!) 142/62 (BP Location: Left Arm, Patient Position: Sitting, Cuff Size: Normal)   Pulse 72   Ht '6\' 6"'$  (1.981 m)   Wt (!) 398 lb 6.4 oz (180.7 kg)   SpO2 99%   BMI 46.04 kg/m      ASSESSMENT:  Diabetes type 2, with morbid obesity   See history of present illness for detailed discussion of current diabetes management, blood sugar patterns and problems identified  He was on insulin using V-go pump and using U-500 insulin.  Also on Victoza  His last A1c was 10.5 but now fructosamine is 272  Although his blood sugars are improving there is still high after meals depending on how well he is bolusing for his type of meal or timing of the bolus has variability in diet Still has difficulty losing weight  His CGM was assessed and discussed abnormal patterns and reviewed his diet at different meals   PLAN:    He needs to adjust his boluses to help control postprandial readings Also needs a bedtime snack if blood sugars are low normal to avoid overnight hypoglycemia Otherwise-continue using the V-go pump and  Victoza  Patient was instructed as follows  The number of clicks you are doing will depend on both blood sugar before eating and what you are eating  IMPORTANT: Try to collect 30 minutes before the planned meal  1.  If the blood sugar is below 180 only take the number of clicks to cover the type of food as below.  If the blood sugar is 180 or more take an extra click to bring it down  2.  At breakfast take 2 clicks for grits.  Take 3 clicks if you eating cereal and fruit If you are eating only small fruit do not take any clicks  3.  At lunchtime take 4-5 clicks based on how much starchy food you are eating  4.  At dinnertime again take 4-5 clicks and only 6 clicks if you eating something sweet  Have a snack with cheese or peanut butter crackers or low-fat yogurt at bedtime if blood sugar is below 120  For your colonoscopy continue wearing the pump but did not do any clicks for the liquid diet the day before.  Keep some glucose tablets in case your blood sugar is getting low  Patient Instructions  The number of clicks you are doing will depend on both blood sugar before eating and what you are eating  IMPORTANT: Try to collect 30 minutes before the planned meal  1.  If the blood sugar is below 180 only take the number of clicks to cover the type of food as below.  If the blood sugar is 180 or more take an extra click to bring it down  2.  At breakfast take 2 clicks for grits.  Take 3 clicks if you eating cereal and fruit If you are eating only small fruit do not take any clicks  3.  At lunchtime take 4-5 clicks based on how much starchy food you are eating  4.  At dinnertime again take 4-5 clicks and only 6 clicks if you eating something sweet  Have a snack with cheese or peanut butter crackers or low-fat yogurt at bedtime if blood sugar is below 120  For your colonoscopy continue wearing the pump but did not do any clicks for the liquid diet the day before.  Keep some  glucose  tablets in case your blood sugar is getting low    Counseling time on subjects discussed in assessment and plan sections is over 50% of today's 25 minute visit  Elayne Snare 12/28/2018, 11:03 AM   Note: This office note was prepared with Dragon voice recognition system technology. Any transcriptional errors that result from this process are unintentional.

## 2018-12-28 ENCOUNTER — Other Ambulatory Visit: Payer: Self-pay

## 2018-12-28 ENCOUNTER — Encounter: Payer: Self-pay | Admitting: Endocrinology

## 2018-12-28 ENCOUNTER — Ambulatory Visit (INDEPENDENT_AMBULATORY_CARE_PROVIDER_SITE_OTHER): Payer: Medicare Other | Admitting: Endocrinology

## 2018-12-28 VITALS — BP 142/62 | HR 72 | Ht 78.0 in | Wt 398.4 lb

## 2018-12-28 DIAGNOSIS — E11649 Type 2 diabetes mellitus with hypoglycemia without coma: Secondary | ICD-10-CM

## 2018-12-28 DIAGNOSIS — E1142 Type 2 diabetes mellitus with diabetic polyneuropathy: Secondary | ICD-10-CM | POA: Diagnosis not present

## 2018-12-28 NOTE — Patient Instructions (Addendum)
The number of clicks you are doing will depend on both blood sugar before eating and what you are eating  IMPORTANT: Try to collect 30 minutes before the planned meal  1.  If the blood sugar is below 180 only take the number of clicks to cover the type of food as below.  If the blood sugar is 180 or more take an extra click to bring it down  2.  At breakfast take 2 clicks for grits.  Take 3 clicks if you eating cereal and fruit If you are eating only small fruit do not take any clicks  3.  At lunchtime take 4-5 clicks based on how much starchy food you are eating  4.  At dinnertime again take 4-5 clicks and only 6 clicks if you eating something sweet  Have a snack with cheese or peanut butter crackers or low-fat yogurt at bedtime if blood sugar is below 120  For your colonoscopy continue wearing the pump but did not do any clicks for the liquid diet the day before.  Keep some glucose tablets in case your blood sugar is getting low

## 2018-12-30 DIAGNOSIS — E113512 Type 2 diabetes mellitus with proliferative diabetic retinopathy with macular edema, left eye: Secondary | ICD-10-CM | POA: Diagnosis not present

## 2018-12-30 DIAGNOSIS — E113513 Type 2 diabetes mellitus with proliferative diabetic retinopathy with macular edema, bilateral: Secondary | ICD-10-CM | POA: Diagnosis not present

## 2018-12-30 DIAGNOSIS — E113511 Type 2 diabetes mellitus with proliferative diabetic retinopathy with macular edema, right eye: Secondary | ICD-10-CM | POA: Diagnosis not present

## 2018-12-30 DIAGNOSIS — H401112 Primary open-angle glaucoma, right eye, moderate stage: Secondary | ICD-10-CM | POA: Diagnosis not present

## 2018-12-30 LAB — HM DIABETES EYE EXAM

## 2019-01-04 ENCOUNTER — Other Ambulatory Visit: Payer: Self-pay

## 2019-01-04 NOTE — Patient Outreach (Signed)
[facility]) Care Management  01/04/2019  [name] October 09, 1950 [id number]  Medication Adherence call to [name] patient wants a call back at a later time patient is showing past due on Enalepril 20 mg under [place].   [facility] Management Direct Dial [phone]  Fax [phone] [email or web address]

## 2019-01-09 ENCOUNTER — Telehealth: Payer: Self-pay | Admitting: *Deleted

## 2019-01-09 ENCOUNTER — Other Ambulatory Visit: Payer: Self-pay

## 2019-01-09 DIAGNOSIS — I10 Essential (primary) hypertension: Secondary | ICD-10-CM

## 2019-01-09 MED ORDER — METOPROLOL SUCCINATE ER 25 MG PO TB24: 25.0000 mg | ORAL_TABLET | Freq: Every day | ORAL | 1 refills | Status: DC

## 2019-01-09 NOTE — Telephone Encounter (Signed)
enalapril (VASOTEC) 20 MG tablet, and atenolol to be filled @  [facility], [place] AT [place] [phone] (Phone) [phone] (Fax)

## 2019-01-09 NOTE — Telephone Encounter (Signed)
-----   Message from Forde Dandy, PharmD sent at 06/08/2018  1:19 PM EST ----- Regarding: Geriatrics Task Force Hi [name], Patient is due for phone follow up for Geriatrics Task Force.  Thanks,   Delsa Sale

## 2019-01-09 NOTE — Telephone Encounter (Signed)
Pt is not on atenolol, he states he got mixed up, it is metoprolol. Has refill of enalapril at pharm. Also his eye md has discont azopt.

## 2019-01-09 NOTE — Telephone Encounter (Signed)
[name] is a 68 y.o. male who was contacted on behalf of [facility] Geriatrics Task Force.   Diabetes Assessment  DM meds and BS checks -  "What medications are you taking for diabetes?" miss, [name] im not at home now but Im taking all my medicine"  -  "How often do you check your blood sugars at home?" at least 3 times a day   - "What have your blood sugars been?" I dont have my machine with me now"   High Blood Pressure Assessment  BP meds & BP checks -  "What medications are you taking for high blood pressure?" these are the meds pt requested today, he was confused about the metoprolol but we went over all his meds, he states he is not home and doesn't have his list with him  - "How often do you check your blood pressure at home?" "I check it sometimes at the drug store but I dont remember it  - "What have your blood pressure readings been?"    Coping with DM and BP - What else are you doing to help with your DM and BP - Diet? I try to eat like ive been told Exercise? You know I walk miss [name], not as much as used to but I try   Medication Access Issues  Medication Issues? -  "Are you getting your medicines refilled on time without skipping any doses?" yes mam, I try to stay up on them  - "Are you having any problems getting/taking your meds (cost, timing, transportation)?" not now, the dr close to where you sit helps me  - Do you need any meds refilled? yes  Conclusion  Close the call - Date of follow-up visit scheduled I see dr [name] in aug, ill call for the clinic and see dr [name]  - "Any other questions or concerns?" no mam  advanced directives

## 2019-01-15 ENCOUNTER — Other Ambulatory Visit: Payer: Self-pay | Admitting: Internal Medicine

## 2019-01-15 ENCOUNTER — Other Ambulatory Visit: Payer: Self-pay | Admitting: Endocrinology

## 2019-01-18 ENCOUNTER — Other Ambulatory Visit: Payer: Self-pay | Admitting: Nephrology

## 2019-01-18 DIAGNOSIS — N183 Chronic kidney disease, stage 3 unspecified: Secondary | ICD-10-CM

## 2019-01-18 DIAGNOSIS — G894 Chronic pain syndrome: Secondary | ICD-10-CM | POA: Diagnosis not present

## 2019-01-18 DIAGNOSIS — E1129 Type 2 diabetes mellitus with other diabetic kidney complication: Secondary | ICD-10-CM

## 2019-01-18 DIAGNOSIS — M545 Low back pain: Secondary | ICD-10-CM | POA: Diagnosis not present

## 2019-01-18 DIAGNOSIS — Z79891 Long term (current) use of opiate analgesic: Secondary | ICD-10-CM | POA: Diagnosis not present

## 2019-01-18 DIAGNOSIS — M79672 Pain in left foot: Secondary | ICD-10-CM | POA: Diagnosis not present

## 2019-01-19 ENCOUNTER — Other Ambulatory Visit: Payer: Self-pay | Admitting: Endocrinology

## 2019-01-21 DIAGNOSIS — Z794 Long term (current) use of insulin: Secondary | ICD-10-CM | POA: Diagnosis not present

## 2019-01-21 DIAGNOSIS — E119 Type 2 diabetes mellitus without complications: Secondary | ICD-10-CM | POA: Diagnosis not present

## 2019-01-21 DIAGNOSIS — E11621 Type 2 diabetes mellitus with foot ulcer: Secondary | ICD-10-CM | POA: Diagnosis not present

## 2019-01-21 DIAGNOSIS — L97521 Non-pressure chronic ulcer of other part of left foot limited to breakdown of skin: Secondary | ICD-10-CM | POA: Diagnosis not present

## 2019-01-24 ENCOUNTER — Other Ambulatory Visit (HOSPITAL_COMMUNITY)
Admission: RE | Admit: 2019-01-24 | Discharge: 2019-01-24 | Disposition: A | Discharge status: Home / Self Care | Payer: Medicare Other | Source: Ambulatory Visit | Attending: Gastroenterology | Admitting: Gastroenterology

## 2019-01-24 DIAGNOSIS — Z1159 Encounter for screening for other viral diseases: Secondary | ICD-10-CM | POA: Insufficient documentation

## 2019-01-24 LAB — SARS CORONAVIRUS 2 (TAT 6-24 HRS): SARS Coronavirus 2: NEGATIVE

## 2019-01-26 ENCOUNTER — Telehealth: Payer: Self-pay | Admitting: Internal Medicine

## 2019-01-26 ENCOUNTER — Telehealth: Payer: Self-pay | Admitting: Dietician

## 2019-01-26 NOTE — Anesthesia Preprocedure Evaluation (Addendum)
Anesthesia Evaluation  Patient identified by MRN, date of birth, ID band Patient awake    Reviewed: Allergy & Precautions, NPO status , Patient's Chart, lab work & pertinent test results, reviewed documented beta blocker date and time   History of Anesthesia Complications Negative for: history of anesthetic complications  Airway Mallampati: III  TM Distance: >3 FB Neck ROM: Full    Dental  (+) Edentulous Lower, Edentulous Upper   Pulmonary asthma , sleep apnea and Continuous Positive Airway Pressure Ventilation , former smoker,    Pulmonary exam normal        Cardiovascular hypertension, Pt. on medications and Pt. on home beta blockers Normal cardiovascular exam+ dysrhythmias (on Eliquis) Atrial Fibrillation   TTE 2016: mild LVH, EF 55%, moderate LAE, mild RV dilation with normal function   Neuro/Psych Depression negative neurological ROS     GI/Hepatic Neg liver ROS, GERD  Medicated and Controlled,  Endo/Other  diabetes, Type 2, Insulin DependentMorbid obesity  Renal/GU Renal InsufficiencyRenal disease     Musculoskeletal  (+) Arthritis ,   Abdominal   Peds  Hematology negative hematology ROS (+)   Anesthesia Other Findings Day of surgery medications reviewed with the patient.  Reproductive/Obstetrics                            Anesthesia Physical Anesthesia Plan  ASA: III  Anesthesia Plan: MAC   Post-op Pain Management:    Induction:   PONV Risk Score and Plan: 1 and Treatment may vary due to age or medical condition and Propofol infusion  Airway Management Planned: Natural Airway and Simple Face Mask  Additional Equipment:   Intra-op Plan:   Post-operative Plan:   Informed Consent: I have reviewed the patients History and Physical, chart, labs and discussed the procedure including the risks, benefits and alternatives for the proposed anesthesia with the patient or authorized  representative who has indicated his/her understanding and acceptance.     Dental advisory given  Plan Discussed with: CRNA  Anesthesia Plan Comments:        Anesthesia Quick Evaluation

## 2019-01-26 NOTE — Telephone Encounter (Signed)
[name] had questions about what he could take to bring his sugar up while on clears for colonoscopy tomorrow. It is 106 right now and he is concerned it might drop too much with no food intake. He knows not to take any clicks form his VGo so is only getting basal insulin. answered his questions about what and how much liquid he can drink to stabilize his blood sugar during colonoscopy prep. Debera Lat, RD 01/26/2019 5:06 PM.

## 2019-01-26 NOTE — Telephone Encounter (Signed)
   Reason for call:   I received a call from [name] at 05:20 PM indicating he had questions about whether he can drink orange juice.   Pertinent Data:   He mentions he had conversation with [name] already and has no further questions. He understands that he is on clears for the colonoscopy tomorrow and he should avoid food consumption. He mentions having mild symptoms of hypoglycemia but when he checked his blood sugar it was >80. No further queestions   Assessment / Plan / Recommendations:   Recommended to continue monitoring and drink apple juice as needed for hypoglycemia  As always, pt is advised that if symptoms worsen or new symptoms arise, they should go to an urgent care facility or to to ER for further evaluation.   [name], MD   01/26/2019, 7:01 PM

## 2019-01-27 ENCOUNTER — Ambulatory Visit (HOSPITAL_COMMUNITY): Payer: Medicare Other | Admitting: Anesthesiology

## 2019-01-27 ENCOUNTER — Other Ambulatory Visit: Payer: Self-pay

## 2019-01-27 ENCOUNTER — Encounter (HOSPITAL_COMMUNITY)
Admission: RE | Disposition: A | Discharge status: Home / Self Care | Payer: Self-pay | Source: Home / Self Care | Attending: Gastroenterology

## 2019-01-27 ENCOUNTER — Ambulatory Visit (HOSPITAL_COMMUNITY)
Admission: RE | Admit: 2019-01-27 | Discharge: 2019-01-27 | Disposition: A | Discharge status: Home / Self Care | Payer: Medicare Other | Attending: Gastroenterology | Admitting: Gastroenterology

## 2019-01-27 ENCOUNTER — Encounter (HOSPITAL_COMMUNITY): Payer: Self-pay

## 2019-01-27 DIAGNOSIS — D123 Benign neoplasm of transverse colon: Secondary | ICD-10-CM | POA: Insufficient documentation

## 2019-01-27 DIAGNOSIS — E1122 Type 2 diabetes mellitus with diabetic chronic kidney disease: Secondary | ICD-10-CM | POA: Insufficient documentation

## 2019-01-27 DIAGNOSIS — K219 Gastro-esophageal reflux disease without esophagitis: Secondary | ICD-10-CM | POA: Insufficient documentation

## 2019-01-27 DIAGNOSIS — N183 Chronic kidney disease, stage 3 (moderate): Secondary | ICD-10-CM | POA: Insufficient documentation

## 2019-01-27 DIAGNOSIS — G4733 Obstructive sleep apnea (adult) (pediatric): Secondary | ICD-10-CM | POA: Diagnosis not present

## 2019-01-27 DIAGNOSIS — Z794 Long term (current) use of insulin: Secondary | ICD-10-CM | POA: Diagnosis not present

## 2019-01-27 DIAGNOSIS — Z7901 Long term (current) use of anticoagulants: Secondary | ICD-10-CM | POA: Diagnosis not present

## 2019-01-27 DIAGNOSIS — K5730 Diverticulosis of large intestine without perforation or abscess without bleeding: Secondary | ICD-10-CM | POA: Insufficient documentation

## 2019-01-27 DIAGNOSIS — I4891 Unspecified atrial fibrillation: Secondary | ICD-10-CM | POA: Diagnosis not present

## 2019-01-27 DIAGNOSIS — Z87891 Personal history of nicotine dependence: Secondary | ICD-10-CM | POA: Insufficient documentation

## 2019-01-27 DIAGNOSIS — E1140 Type 2 diabetes mellitus with diabetic neuropathy, unspecified: Secondary | ICD-10-CM | POA: Insufficient documentation

## 2019-01-27 DIAGNOSIS — I129 Hypertensive chronic kidney disease with stage 1 through stage 4 chronic kidney disease, or unspecified chronic kidney disease: Secondary | ICD-10-CM | POA: Diagnosis not present

## 2019-01-27 DIAGNOSIS — D122 Benign neoplasm of ascending colon: Secondary | ICD-10-CM | POA: Insufficient documentation

## 2019-01-27 DIAGNOSIS — Z1211 Encounter for screening for malignant neoplasm of colon: Secondary | ICD-10-CM | POA: Insufficient documentation

## 2019-01-27 DIAGNOSIS — Z6841 Body Mass Index (BMI) 40.0 and over, adult: Secondary | ICD-10-CM | POA: Insufficient documentation

## 2019-01-27 DIAGNOSIS — Z89432 Acquired absence of left foot: Secondary | ICD-10-CM | POA: Diagnosis not present

## 2019-01-27 DIAGNOSIS — E11319 Type 2 diabetes mellitus with unspecified diabetic retinopathy without macular edema: Secondary | ICD-10-CM | POA: Insufficient documentation

## 2019-01-27 HISTORY — PX: POLYPECTOMY: SHX5525

## 2019-01-27 HISTORY — PX: COLONOSCOPY WITH PROPOFOL: SHX5780

## 2019-01-27 LAB — GLUCOSE, CAPILLARY
Glucose-Capillary: 139 mg/dL — ABNORMAL HIGH (ref 70–99)
Glucose-Capillary: 168 mg/dL — ABNORMAL HIGH (ref 70–99)

## 2019-01-27 SURGERY — COLONOSCOPY WITH PROPOFOL
Anesthesia: Monitor Anesthesia Care

## 2019-01-27 MED ORDER — SODIUM CHLORIDE 0.9 % IV SOLN: INTRAVENOUS | Status: DC

## 2019-01-27 MED ORDER — PROPOFOL 10 MG/ML IV BOLUS
INTRAVENOUS | Status: AC
  Filled 2019-01-27: qty 20

## 2019-01-27 MED ORDER — PROPOFOL 10 MG/ML IV BOLUS
INTRAVENOUS | Status: DC | PRN
  Administered 2019-01-27: 30 mg via INTRAVENOUS

## 2019-01-27 MED ORDER — PROPOFOL 10 MG/ML IV BOLUS
INTRAVENOUS | Status: AC
  Filled 2019-01-27: qty 60

## 2019-01-27 MED ORDER — PROPOFOL 500 MG/50ML IV EMUL
INTRAVENOUS | Status: DC | PRN
  Administered 2019-01-27: 125 ug/kg/min via INTRAVENOUS
  Administered 2019-01-27: 100 ug/kg/min via INTRAVENOUS

## 2019-01-27 MED ORDER — PROPOFOL 10 MG/ML IV BOLUS
INTRAVENOUS | Status: AC
  Filled 2019-01-27: qty 40

## 2019-01-27 MED ORDER — LACTATED RINGERS IV SOLN
INTRAVENOUS | Status: DC
  Administered 2019-01-27: 1000 mL via INTRAVENOUS

## 2019-01-27 SURGICAL SUPPLY — 22 items

## 2019-01-27 NOTE — Anesthesia Procedure Notes (Signed)
Procedure Name: MAC Date/Time: 01/27/2019 9:25 AM Performed by: Eben Burow, CRNA Pre-anesthesia Checklist: Patient identified, Emergency Drugs available, Suction available, Patient being monitored and Timeout performed Oxygen Delivery Method: Simple face mask Dental Injury: Teeth and Oropharynx as per pre-operative assessment

## 2019-01-27 NOTE — Op Note (Signed)
[facility] Patient Name: [name] Procedure Date: 01/27/2019 MRN: [id number] Attending MD: Carol Ada , MD Date of Birth: Nov 26, 1950 CSN: [id number] Age: 68 Admit Type: Outpatient Procedure:                Colonoscopy Indications:              Screening for colorectal malignant neoplasm Providers:                Carol Ada, MD, Baird Cancer, RN, Ashley Jacobs,                            RN, Cletis Athens, Technician Referring MD:              Medicines:                Propofol per Anesthesia Complications:            No immediate complications. Estimated Blood Loss:     Estimated blood loss: none. Procedure:                Pre-Anesthesia Assessment:                           - Prior to the procedure, a History and Physical                            was performed, and patient medications and                            allergies were reviewed. The patient's tolerance of                            previous anesthesia was also reviewed. The risks                            and benefits of the procedure and the sedation                            options and risks were discussed with the patient.                            All questions were answered, and informed consent                            was obtained. Prior Anticoagulants: The patient has                            taken Eliquis (apixaban), last dose was 2 days                            prior to procedure. ASA Grade Assessment: III - A                            patient with severe systemic disease. After  reviewing the risks and benefits, the patient was                            deemed in satisfactory condition to undergo the                            procedure.                           - Sedation was administered by an anesthesia                            professional. Deep sedation was attained.                           After obtaining informed consent, the  colonoscope                            was passed under direct vision. Throughout the                            procedure, the patient's blood pressure, pulse, and                            oxygen saturations were monitored continuously. The                            CF-HQ190L ([id number] Olympus colonoscope was                            introduced through the anus and advanced to the the                            cecum, identified by appendiceal orifice and                            ileocecal valve. The colonoscopy was somewhat                            difficult due to significant looping. Successful                            completion of the procedure was aided by applying                            abdominal pressure. The patient tolerated the                            procedure well. The quality of the bowel                            preparation was good. The ileocecal valve,                            appendiceal orifice,  and rectum were photographed. Scope In: 9:27:50 AM Scope Out: 9:58:43 AM Scope Withdrawal Time: 0 hours 22 minutes 26 seconds  Total Procedure Duration: 0 hours 30 minutes 53 seconds  Findings:      Two sessile polyps were found in the ascending colon. The polyps were 2       to 3 mm in size.      Scattered small and large-mouthed diverticula were found in the sigmoid       colon and descending colon. Impression:               - Two 2 to 3 mm polyps in the ascending colon.                           - Diverticulosis in the sigmoid colon and in the                            descending colon.                           - No specimens collected. Moderate Sedation:      Not Applicable - Patient had care per Anesthesia. Recommendation:           - Patient has a contact number available for                            emergencies. The signs and symptoms of potential                            delayed complications were discussed with the                             patient. Return to normal activities tomorrow.                            Written discharge instructions were provided to the                            patient.                           - Resume previous diet.                           - Continue present medications.                           - Repeat colonoscopy in 7 years for surveillance.                           - Okay to restart Eliquis. Procedure Code(s):        --- Professional ---                           [phone], Colonoscopy, flexible; diagnostic, including                            collection  of specimen(s) by brushing or washing,                            when performed (separate procedure) Diagnosis Code(s):        --- Professional ---                           Z12.11, Encounter for screening for malignant                            neoplasm of colon                           K63.5, Polyp of colon                           K57.30, Diverticulosis of large intestine without                            perforation or abscess without bleeding CPT copyright 2019 American [employer] Association. All rights reserved. The codes documented in this report are preliminary and upon coder review may  be revised to meet current compliance requirements. Carol Ada, MD Carol Ada, MD 01/27/2019 10:03:13 AM This report has been signed electronically. Number of Addenda: 0

## 2019-01-27 NOTE — Telephone Encounter (Signed)
Thank you [name] 

## 2019-01-27 NOTE — Anesthesia Postprocedure Evaluation (Signed)
Anesthesia Post Note  Patient: [name]  Procedure(s) Performed: COLONOSCOPY WITH PROPOFOL (N/A ) POLYPECTOMY     Patient location during evaluation: PACU Anesthesia Type: MAC Level of consciousness: awake and alert Pain management: pain level controlled Vital Signs Assessment: post-procedure vital signs reviewed and stable Respiratory status: spontaneous breathing, nonlabored ventilation and respiratory function stable Cardiovascular status: blood pressure returned to baseline and stable Postop Assessment: no apparent nausea or vomiting Anesthetic complications: no    Last Vitals:  Vitals:   01/27/19 1030 01/27/19 1040  BP: (!) 180/72   Pulse: 65 65  Resp: 16 18  Temp:    SpO2: 100% 100%    Last Pain:  Vitals:   01/27/19 1030  TempSrc:   PainSc: 0-No pain                 Brennan Bailey

## 2019-01-27 NOTE — Discharge Instructions (Signed)

## 2019-01-27 NOTE — H&P (Signed)
[name] HPI: At this time the patient denies any problems with nausea, vomiting, fevers, chills, abdominal pain, diarrhea, hematochezia, Farrell, GERD, or dysphagia. The patient denies any known family history of colon cancers. No complaints of chest pain, SOB, MI, or sleep apnea. The patient reports having an A1C in the 7-8% range. He is s/p left toe amputations as a result of his diabetes. Constipation is an intermittent problem for him as a result of his hydrocodone. The patient's colonoscopy 09/2008 was only noteable for diverticula. He needed to be reprepped as his initial attempt was poorly prepped.   Past Medical History:  Diagnosis Date  . Arthritis    "elbows & knees" (12/13/2014)  . Asthma   . CKD (chronic kidney disease), stage III ([place])   . DIABETIC FOOT ULCER 06/20/2009  . Edema, macular, due to secondary diabetes ([place])   . Erectile dysfunction   . GERD (gastroesophageal reflux disease)   . HEARING LOSS, SENSORINEURAL, BILATERAL 01/03/2007   Seen by ENT Dr. Orpah Greek D. Redmond Baseman 01/03/07  . Hemorrhoids   . History of echocardiogram    a. 04/2008 Echo: EF 50-55%, abnl LV relaxation, mildly dil LA.  Marland Kitchen Hyperlipidemia   . Hypertension   . Morbid obesity ([place])   . Neuropathy, lower extremity   . OSA (obstructive sleep apnea)    uses CPAP nightly  . OSA on CPAP    Nocturnal polysomnogram on 01/21/2010 showed severe obstructive sleep apnea/hypopnea syndrome, AHI 74.1 per hour with non positional events, moderately loud snoring, and oxygen desaturation to a nadir of 78% on room air.  CPAP was successfully titrated to 17 CWP, AHI 1.1 per hour using a large ResMed Mirage Quattro full-face mask with heated humidifier. Bruxism was noted.   . Osteomyelitis of ankle and foot ([place])   . Retinopathy   . Type II diabetes mellitus ([facility])    w/complication NOS, type II    Past Surgical History:  Procedure Laterality Date  . ACHILLES TENDON LENGTHENING Left 03/21/2018   Procedure: ACHILLES  TENDON LENGTHENING;  Surgeon: Erle Crocker, MD;  Location: [facility];  Service: Orthopedics;  Laterality: Left;  . AMPUTATION Left 12/15/2014   Procedure: LEFT SECOND TOE AMPUTATION ;  Surgeon: Dorna Leitz, MD;  Location: [place];  Service: Orthopedics;  Laterality: Left;  . AMPUTATION Left 03/21/2018   Procedure: TRANSMETATARSAL AMPUTATION LEFT FOOT;  Surgeon: Erle Crocker, MD;  Location: [facility];  Service: Orthopedics;  Laterality: Left;  Popliteal & ankle block  . 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 Kathalene Frames. Mayer Camel.  . WOUND DEBRIDEMENT Left 06/15/2016   Procedure: DEBRIDEMENT WOUND LEFT FOOT WITH GRAFT APPLICATION;  Surgeon: Landis Martins, DPM;  Location: [place];  Service: Podiatry;  Laterality: Left;    Family History  Problem Relation Age of Onset  . Diabetes Mother        also 2 siblings  . Heart attack Father 58  . Throat cancer Brother     Social History:  reports that he quit smoking about 8 years ago. His smoking use included cigarettes. He has a 15.00 pack-year smoking history. He has never used smokeless tobacco. He reports current alcohol use of about 11.0 standard drinks of alcohol per week. He reports that he does not use drugs.  Allergies:  Allergies  Allergen Reactions  . Vancomycin     REACTION: ARF    Medications:  Scheduled:  Continuous: . sodium chloride    . lactated ringers 1,000 mL (01/27/19 0857)    Results for orders placed or performed during the hospital encounter of 01/27/19 (from the past 24 hour(s))  Glucose, capillary     Status: Abnormal   Collection Time: 01/27/19  8:56 AM  Result Value Ref Range   Glucose-Capillary 139 (H) 70 - 99 mg/dL   *Note: Due to a large number of results and/or encounters for the requested time period, some results have not been displayed. A complete set of results can be found in Results Review.     No  results found.  ROS:  As stated above in the HPI otherwise negative.  Blood pressure (!) 176/86, pulse 74, temperature (!) 97.5 F (36.4 C), temperature source Oral, resp. rate (!) 21, height 6\' 6"  (1.981 m), weight (!) 174.6 kg, SpO2 100 %.    PE: Gen: NAD, Alert and Oriented HEENT:  [place]/AT, EOMI Neck: Supple, no LAD Lungs: CTA Bilaterally CV: RRR without M/G/R ABM: Soft, NTND, +BS Ext: No C/C/E  Assessment/Plan: 1) Screening colonoscopy.  Harriet Bollen D 01/27/2019, 9:09 AM

## 2019-01-27 NOTE — Transfer of Care (Signed)
Immediate Anesthesia Transfer of Care Note  Patient: FABIAN WALDER  Procedure(s) Performed: COLONOSCOPY WITH PROPOFOL (N/A ) POLYPECTOMY  Patient Location: PACU and Endoscopy Unit  Anesthesia Type:MAC  Level of Consciousness: awake, alert  and oriented  Airway & Oxygen Therapy: Patient Spontanous Breathing and Patient connected to face mask oxygen  Post-op Assessment: Report given to RN and Post -op Vital signs reviewed and stable  Post vital signs: Reviewed and stable  Last Vitals:  Vitals Value Taken Time  BP    Temp    Pulse 69 01/27/19 1006  Resp 17 01/27/19 1006  SpO2 99 % 01/27/19 1006  Vitals shown include unvalidated device data.  Last Pain:  Vitals:   01/27/19 0846  TempSrc: Oral  PainSc: 0-No pain         Complications: No apparent anesthesia complications

## 2019-01-30 ENCOUNTER — Other Ambulatory Visit: Payer: Medicare Other

## 2019-02-02 DIAGNOSIS — H401112 Primary open-angle glaucoma, right eye, moderate stage: Secondary | ICD-10-CM | POA: Diagnosis not present

## 2019-02-02 DIAGNOSIS — E113513 Type 2 diabetes mellitus with proliferative diabetic retinopathy with macular edema, bilateral: Secondary | ICD-10-CM | POA: Diagnosis not present

## 2019-02-09 ENCOUNTER — Telehealth: Payer: Self-pay | Admitting: Dietician

## 2019-02-09 NOTE — Telephone Encounter (Signed)
[name] called for assistance with keeping his CGM sensor on. It fell off after 8 days. I recommended he try applying skin Tac in doughnut shape where he is going to apply the sensor tape. He can purchase Skin Tac at [facility]. He verbalized understanding. We agreed that I would mail him information about other tips and products for helping the sensor stick for 14 days of wear.

## 2019-02-10 ENCOUNTER — Other Ambulatory Visit: Payer: Self-pay | Admitting: Internal Medicine

## 2019-02-10 NOTE — Telephone Encounter (Signed)
Medication no longer on list, will send to pcp for review.  CMA contacted pt and pt confirmed that he was taking atorvastatin as instructed.Regenia Skeeter, Antonis Lor Cassady7/31/20209:30 AM

## 2019-02-13 ENCOUNTER — Other Ambulatory Visit: Payer: Self-pay | Admitting: Endocrinology

## 2019-02-13 DIAGNOSIS — G894 Chronic pain syndrome: Secondary | ICD-10-CM | POA: Diagnosis not present

## 2019-02-13 DIAGNOSIS — M79672 Pain in left foot: Secondary | ICD-10-CM | POA: Diagnosis not present

## 2019-02-13 DIAGNOSIS — M545 Low back pain: Secondary | ICD-10-CM | POA: Diagnosis not present

## 2019-02-13 DIAGNOSIS — Z79891 Long term (current) use of opiate analgesic: Secondary | ICD-10-CM | POA: Diagnosis not present

## 2019-02-21 ENCOUNTER — Ambulatory Visit
Admission: RE | Admit: 2019-02-21 | Discharge: 2019-02-21 | Disposition: A | Discharge status: Home / Self Care | Payer: Medicare Other | Source: Ambulatory Visit | Attending: Nephrology | Admitting: Nephrology

## 2019-02-21 DIAGNOSIS — Z794 Long term (current) use of insulin: Secondary | ICD-10-CM | POA: Diagnosis not present

## 2019-02-21 DIAGNOSIS — E119 Type 2 diabetes mellitus without complications: Secondary | ICD-10-CM | POA: Diagnosis not present

## 2019-02-21 DIAGNOSIS — N183 Chronic kidney disease, stage 3 unspecified: Secondary | ICD-10-CM

## 2019-02-21 DIAGNOSIS — N189 Chronic kidney disease, unspecified: Secondary | ICD-10-CM | POA: Diagnosis not present

## 2019-02-21 DIAGNOSIS — L97521 Non-pressure chronic ulcer of other part of left foot limited to breakdown of skin: Secondary | ICD-10-CM | POA: Diagnosis not present

## 2019-02-21 DIAGNOSIS — E11621 Type 2 diabetes mellitus with foot ulcer: Secondary | ICD-10-CM | POA: Diagnosis not present

## 2019-02-21 DIAGNOSIS — E1129 Type 2 diabetes mellitus with other diabetic kidney complication: Secondary | ICD-10-CM

## 2019-02-27 DIAGNOSIS — E1122 Type 2 diabetes mellitus with diabetic chronic kidney disease: Secondary | ICD-10-CM | POA: Diagnosis not present

## 2019-02-27 DIAGNOSIS — N183 Chronic kidney disease, stage 3 (moderate): Secondary | ICD-10-CM | POA: Diagnosis not present

## 2019-02-27 DIAGNOSIS — I129 Hypertensive chronic kidney disease with stage 1 through stage 4 chronic kidney disease, or unspecified chronic kidney disease: Secondary | ICD-10-CM | POA: Diagnosis not present

## 2019-02-27 DIAGNOSIS — R319 Hematuria, unspecified: Secondary | ICD-10-CM | POA: Diagnosis not present

## 2019-02-27 DIAGNOSIS — R809 Proteinuria, unspecified: Secondary | ICD-10-CM | POA: Diagnosis not present

## 2019-02-28 ENCOUNTER — Other Ambulatory Visit: Payer: Self-pay

## 2019-02-28 ENCOUNTER — Other Ambulatory Visit (INDEPENDENT_AMBULATORY_CARE_PROVIDER_SITE_OTHER): Payer: Medicare Other

## 2019-02-28 DIAGNOSIS — E11649 Type 2 diabetes mellitus with hypoglycemia without coma: Secondary | ICD-10-CM | POA: Diagnosis not present

## 2019-02-28 LAB — BASIC METABOLIC PANEL
BUN: 17 mg/dL (ref 6–23)
CO2: 26 mEq/L (ref 19–32)
Calcium: 8.7 mg/dL (ref 8.4–10.5)
Chloride: 107 mEq/L (ref 96–112)
Creatinine, Ser: 1.56 mg/dL — ABNORMAL HIGH (ref 0.40–1.50)
GFR: 53.66 mL/min — ABNORMAL LOW (ref >60.00)
Glucose, Bld: 112 mg/dL — ABNORMAL HIGH (ref 70–99)
Potassium: 4 mEq/L (ref 3.5–5.1)
Sodium: 138 mEq/L (ref 135–145)

## 2019-02-28 LAB — HEMOGLOBIN A1C: Hgb A1c MFr Bld: 9.8 % — ABNORMAL HIGH (ref 4.6–6.5)

## 2019-03-02 ENCOUNTER — Telehealth: Payer: Self-pay | Admitting: Dietician

## 2019-03-02 NOTE — Telephone Encounter (Signed)
[name] called for clarification about skin tac adhesive barrier for his Continuous glucose monitor sensors. I alerted him that there is now an updated version of the Freestyle Libre CGM with alarms.

## 2019-03-03 ENCOUNTER — Ambulatory Visit (INDEPENDENT_AMBULATORY_CARE_PROVIDER_SITE_OTHER): Payer: Medicare Other | Admitting: Endocrinology

## 2019-03-03 ENCOUNTER — Other Ambulatory Visit: Payer: Self-pay

## 2019-03-03 ENCOUNTER — Encounter: Payer: Self-pay | Admitting: Endocrinology

## 2019-03-03 DIAGNOSIS — E11649 Type 2 diabetes mellitus with hypoglycemia without coma: Secondary | ICD-10-CM | POA: Diagnosis not present

## 2019-03-03 NOTE — Progress Notes (Signed)
Patient ID: [name], male   DOB: 05/08/1951, 68 y.o.   MRN: [id number]             Reason for Appointment: Follow-up for Type 2 Diabetes  Today's office visit was provided via telemedicine using a telephone call to the patient Patient has been explained the limitations of evaluation and management by telemedicine and the availability of in person appointments.  The patient understood the limitations and agreed to proceed. Patient also understood that the telehealth visit is billable. . Location of the patient: Home . Location of the provider: Office Only the patient and myself were participating in the encounter   History of Present Illness:          Date of diagnosis of type 2 diabetes mellitus: 2007       Background history:   He was diagnosed to have diabetes when he had an ulcer on his left third toe which led to amputation.  His glucose was apparently about 400 and he was started on insulin at that time He does not know if he has taken any oral hypoglycemic drugs in the past His A1c in 2016 has been consistently over 8% On his initial consultation was started on Victoza and subsequently started on U-500 insulin in 2/17 along with Toujeo INSULIN regimen previously was:  TOUJEO  74 in the morning and 74 in the evening HUMULIN U-500 insulin: 20/40 units in the morning, 60/70 at lunch and 70 at supper  Recent history:   Since 8/18 had been using V-go PUMP 20 unit basal, Humulin R U-500 insulin Recent boluses 4 units breakfast, 8-12 units acl, 12-16 units at dinner  Non-insulin hypoglycemic drugs the patient is taking are: Victoza 1.8 mg daily        His A1c is again significantly higher now 9.5, previously 10.5  Current blood sugar patterns and problems identified:  He appears to be again having issues with his V-go pump not sticking consistently and sometimes coming loose in the shower  He still has not been able to get the skin tac to help with this problem   However his blood sugars appear to be mostly higher postprandially and progressively higher in the evenings but better overnight  As before his diet can be variable and sometimes can have increased carbohydrate intake for higher fat meals  On his own he has tried to increase his boluses to 4-6 clicks and also generally tomorrow takes at dinnertime  However is mostly adjusting his mealtime dose based on his blood sugar level and not necessarily what he is planning to eat  He has difficulty remembering to do this 30-minute before eating   Glucose monitoring:  done 3-4  times a day         Glucometer:  freestyle libre 14-day  CGM use % of time  97  2-week average/SD  202+/-30  Time in range       42 % was 65  % Time Above 180  37  % Time above 250  21  % Time Below 70      PRE-MEAL Fasting Lunch Dinner Bedtime Overall  Glucose range:       Averages:  152   212  254 202   POST-MEAL PC Breakfast PC Lunch PC Dinner  Glucose range:     Averages:   204  282       CONTINUOUS GLUCOSE MONITORING RECORD INTERPRETATION    Dates of Recording: 6/2 through 6/17  Sensor description: Freestyle libre  Results statistics:   CGM use % of time  86  Average and SD 154  Time in range     65   %  % Time Above 180  23  % Time above 250  7  % Time Below target  5    Glycemic patterns summary: His blood sugars are excellent through the night but rising to a variable extent after his meals after about 12 noon Has rare hypoglycemia overnight  Hyperglycemic episodes are occurring primarily after lunch or dinner meal with no consistency  Hypoglycemic episodes occurred significantly only on 6/10 during the night when it was repeatedly low  Overnight periods: His blood sugars are somewhat variable and depend partly on what blood sugars are at bedtime but after about 3 AM blood sugars are fairly stable with an average of about 110-120  Preprandial periods: Blood sugars are near normal in  the mornings, moderately higher at lunch and also at dinnertime as seen in the average below.  Has significant variability at lunch and dinner  Postprandial periods:   After breakfast:   Blood sugars are not rising until about 11 AM but not clear what time he is eating breakfast if any  After lunch:   glucose is rising variably after his midday meal and may be as much as 291  Blood sugars after dinner are rising moderately but usually not much over the pre-meal reading    PRE-MEAL Fasting  AC/PC lunch  AC/PC S Bedtime Overall  Glucose range:       Mean/median:  114  127/197   171/210  191       Self-care:  Typical meal intake: Breakfast is sometimes fruit like banana or cereal, Sometimes eating sausage and grits Usually trying to avoid high-fat foods or fried foods  Breakfast 8 AM Lunch 12 noon Dinner at 5-6 pm He drinks cranberry or V-8 juice and diet drinks                Dietician consultation: 1/19               Exercise:  None, usually has chronic problems with foot ulcer  Weight history: Previous range 260-410  Wt Readings from Last 3 Encounters:  01/27/19 (!) 385 lb (174.6 kg)  12/28/18 (!) 398 lb 6.4 oz (180.7 kg)  08/23/18 (!) 393 lb 11.2 oz (178.6 kg)    Glycemic control:   Lab Results  Component Value Date   HGBA1C 9.8 (H) 02/28/2019   HGBA1C 10.5 (H) 11/14/2018   HGBA1C 9.0 (H) 08/12/2018   Lab Results  Component Value Date   MICROALBUR 32.7 (H) 12/26/2018   LDLCALC 55 05/13/2018   CREATININE 1.56 (H) 02/28/2019     OTHER problems review today: See review of systems      Lab on 02/28/2019  Component Date Value Ref Range Status  . Sodium 02/28/2019 138  135 - 145 mEq/L Final  . Potassium 02/28/2019 4.0  3.5 - 5.1 mEq/L Final  . Chloride 02/28/2019 107  96 - 112 mEq/L Final  . CO2 02/28/2019 26  19 - 32 mEq/L Final  . Glucose, Bld 02/28/2019 112* 70 - 99 mg/dL Final  . BUN 02/28/2019 17  6 - 23 mg/dL Final  . Creatinine, Ser 02/28/2019  1.56* 0.40 - 1.50 mg/dL Final  . Calcium 02/28/2019 8.7  8.4 - 10.5 mg/dL Final  . GFR 02/28/2019 53.66* >60.00 mL/min Final  . Hgb A1c MFr Bld  02/28/2019 9.8* 4.6 - 6.5 % Final   Glycemic Control Guidelines for People with Diabetes:Non Diabetic:  <6%Goal of Therapy: <7%Additional Action Suggested:  >8%        Allergies as of 03/03/2019      Reactions   Vancomycin    REACTION: ARF      Medication List       Accurate as of March 03, 2019 11:12 AM. If you have any questions, ask your nurse or doctor.        Accu-Chek Aviva Plus w/Device Kit CHECK BLOOD SUGAR 5 TIMES A DAY   Accu-Chek FastClix Lancets Misc Check blood sugar 5 times a day   albuterol 108 (90 Base) MCG/ACT inhaler Commonly known as: ProAir HFA Inhale 2 puffs into the lungs every 6 (six) hours as needed for wheezing or shortness of breath.   atorvastatin 20 MG tablet Commonly known as: LIPITOR TAKE 1 TABLET(20 MG) BY MOUTH DAILY   enalapril 20 MG tablet Commonly known as: VASOTEC Take 2 tablets (40 mg total) by mouth daily.   FreeStyle Libre 14 Day Sensor Misc 1 each by Does not apply route 6 (six) times daily.   gabapentin 300 MG capsule Commonly known as: NEURONTIN Take 1 capsule (300 mg total) by mouth at bedtime.   glucose blood test strip Commonly known as: FreeStyle Precision Neo Test 1 each by Other route as needed for other. Use as instructed to check blood sugar 2 times daily.   glucose blood test strip Commonly known as: Accu-Chek Aviva Plus USE TO CHECK BLOOD FIVE TIMES DAILY   HUMULIN R 500 UNIT/ML injection Generic drug: insulin regular human CONCENTRATED DRAW INSULIN TO THE 68 UNIT LINE ON THE U-100 SYRINGE(TO USE 340 UNITS) PER DAY AS DIRECTED   HYDROcodone-acetaminophen 7.5-325 MG tablet Commonly known as: NORCO Take 1 tablet by mouth every 8 (eight) hours as needed for moderate pain or severe pain.   hydroxypropyl methylcellulose / hypromellose 2.5 % ophthalmic solution  Commonly known as: ISOPTO TEARS / GONIOVISC Place 1-2 drops into both eyes 3 (three) times daily as needed for dry eyes (dry/irritated eyes.).   INSULIN SYRINGE 1CC/29G 29G X 1/2" 1 ML Misc USE DAILY AS DIRECTED   Restasis 0.05 % ophthalmic emulsion Generic drug: cycloSPORINE Place 1 drop into both eyes daily.   Simbrinza 1-0.2 % Susp Generic drug: Brinzolamide-Brimonidine Place 1 drop into both eyes 2 (two) times daily.   V-Go 20 Kit USE DAILY WITH INSULIN   Victoza 18 MG/3ML Sopn Generic drug: liraglutide ADMINISTER 1.8 MG UNDER THE SKIN AT THE SAME TIME EVERY DAY   Vitamin D3 50 MCG (2000 UT) Tabs Take 2,000 Units by mouth daily.       Allergies:  Allergies  Allergen Reactions  . Vancomycin     REACTION: ARF    Past Medical History:  Diagnosis Date  . Arthritis    "elbows & knees" (12/13/2014)  . Asthma   . CKD (chronic kidney disease), stage III ([place])   . DIABETIC FOOT ULCER 06/20/2009  . Edema, macular, due to secondary diabetes ([place])   . Erectile dysfunction   . GERD (gastroesophageal reflux disease)   . HEARING LOSS, SENSORINEURAL, BILATERAL 01/03/2007   Seen by ENT Dr. Orpah Greek D. Redmond Baseman 01/03/07  . Hemorrhoids   . History of echocardiogram    a. 04/2008 Echo: EF 50-55%, abnl LV relaxation, mildly dil LA.  Marland Kitchen Hyperlipidemia   . Hypertension   . Morbid obesity ([place])   . Neuropathy, lower  extremity   . OSA (obstructive sleep apnea)    uses CPAP nightly  . OSA on CPAP    Nocturnal polysomnogram on 01/21/2010 showed severe obstructive sleep apnea/hypopnea syndrome, AHI 74.1 per hour with non positional events, moderately loud snoring, and oxygen desaturation to a nadir of 78% on room air.  CPAP was successfully titrated to 17 CWP, AHI 1.1 per hour using a large ResMed Mirage Quattro full-face mask with heated humidifier. Bruxism was noted.   . Osteomyelitis of ankle and foot ([place])   . Retinopathy   . Type II diabetes mellitus ([facility])    w/complication NOS, type  II    Past Surgical History:  Procedure Laterality Date  . ACHILLES TENDON LENGTHENING Left 03/21/2018   Procedure: ACHILLES TENDON LENGTHENING;  Surgeon: Erle Crocker, MD;  Location: [facility];  Service: Orthopedics;  Laterality: Left;  . AMPUTATION Left 12/15/2014   Procedure: LEFT SECOND TOE AMPUTATION ;  Surgeon: Dorna Leitz, MD;  Location: [facility];  Service: Orthopedics;  Laterality: Left;  . AMPUTATION Left 03/21/2018   Procedure: TRANSMETATARSAL AMPUTATION LEFT FOOT;  Surgeon: Erle Crocker, MD;  Location: [facility];  Service: Orthopedics;  Laterality: Left;  Popliteal & ankle block  . COLONOSCOPY WITH PROPOFOL N/A 01/27/2019   Procedure: COLONOSCOPY WITH PROPOFOL;  Surgeon: Carol Ada, MD;  Location: [facility];  Service: Endoscopy;  Laterality: N/A;  . POLYPECTOMY  01/27/2019   Procedure: POLYPECTOMY;  Surgeon: Carol Ada, MD;  Location: [facility];  Service: Endoscopy;;  . 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 Kathalene Frames. Mayer Camel.  . WOUND DEBRIDEMENT Left 06/15/2016   Procedure: DEBRIDEMENT WOUND LEFT FOOT WITH GRAFT APPLICATION;  Surgeon: Landis Martins, DPM;  Location: [facility];  Service: Podiatry;  Laterality: Left;    Family History  Problem Relation Age of Onset  . Diabetes Mother        also 2 siblings  . Heart attack Father 39  . Throat cancer Brother     Social History:  reports that he quit smoking about 9 years ago. His smoking use included cigarettes. He has a 15.00 pack-year smoking history. He has never used smokeless tobacco. He reports current alcohol use of about 11.0 standard drinks of alcohol per week. He reports that he does not use drugs.    Review of Systems     Lipid management: taking Lipitor 20 mg, followed by PCP Also has low HDL    Lab Results  Component Value Date   CHOL 114 05/13/2018   HDL 29.00 (L) 05/13/2018   LDLCALC 55  05/13/2018   TRIG 146.0 05/13/2018   CHOLHDL 4 05/13/2018            Hypertension: Has had hypertension for a few years treated with enalapril,  taking 20 mg   Also on Lasix and 25 mg metoprolol, and  taking amlodipine 10 mg  This is treated by PCP/cardiologist. He will sometimes check blood pressure at home  BP Readings from Last 3 Encounters:  01/27/19 (!) 180/72  12/28/18 (!) 142/62  08/23/18 137/64    He  has chronic renal dysfunction, previously seen by nephrologist  Lab Results  Component Value Date   CREATININE 1.56 (H) 02/28/2019   CREATININE 1.94 (H) 12/26/2018   CREATININE 1.51 (H) 11/14/2018    Recently has had periodic nausea and vomiting, has been given promethazine as needed by his PCP   Physical  Examination:  There were no vitals taken for this visit.     ASSESSMENT:  Diabetes type 2, with morbid obesity   See history of present illness for detailed discussion of current diabetes management, blood sugar patterns and problems identified  He is on the V-go pump and using U-500 insulin.  Also on Victoza 1.8 mg  His last A1c was 10.5 and now only slightly better at 9.5  Not clear why he is getting more insulin resistant with much higher blood sugars after meals and averaging nearly 300 postprandially in the evening He has increased his boluses also Diet can be better however Reviewed his day-to-day management, blood sugar patterns and discussed adjustment of mealtime boluses as well as blood sugar targets  Since he is having difficulty with keeping his V-go pump and the freestyle libre staying on consistently this may be affecting her sugar control to some extent Still not able to exercise  Nausea and vomiting is recent Etiology not clear He has not had side effects from the Victoza in the past He will need to follow-up with PCP for this   PLAN:    He needs to increase his boluses by 2 units at his main meals which are lunch and dinner He will  take 8-18 clicks at lunchtime and 10-12 at dinnertime This will depend on what he is planning to eat as well as blood sugar level Bolus 30-minute before eating He will be given information in the mail on how to help his V-go pump sticks and he will try to get the products from [place] if he cannot do them at a local drugstore  He can hold his Victoza for 1 week to see if his GI symptoms get better and then start back gradually with 0.6 mg for the first week and then 1.2 mg if tolerated  Follow-up in 2 months   There are no Patient Instructions on file for this visit.   Total telephone encounter time =12 minutes  Elayne Snare 03/03/2019, 11:12 AM   Note: This office note was prepared with Dragon voice recognition system technology. Any transcriptional errors that result from this process are unintentional.

## 2019-03-07 ENCOUNTER — Other Ambulatory Visit: Payer: Self-pay

## 2019-03-07 ENCOUNTER — Ambulatory Visit (INDEPENDENT_AMBULATORY_CARE_PROVIDER_SITE_OTHER): Payer: Medicare Other | Admitting: Internal Medicine

## 2019-03-07 VITALS — BP 147/73 | HR 62 | Temp 98.3°F | Ht 78.0 in | Wt >= 6400 oz

## 2019-03-07 DIAGNOSIS — Z7901 Long term (current) use of anticoagulants: Secondary | ICD-10-CM

## 2019-03-07 DIAGNOSIS — N183 Chronic kidney disease, stage 3 unspecified: Secondary | ICD-10-CM

## 2019-03-07 DIAGNOSIS — Z23 Encounter for immunization: Secondary | ICD-10-CM | POA: Diagnosis not present

## 2019-03-07 DIAGNOSIS — I129 Hypertensive chronic kidney disease with stage 1 through stage 4 chronic kidney disease, or unspecified chronic kidney disease: Secondary | ICD-10-CM | POA: Diagnosis not present

## 2019-03-07 DIAGNOSIS — R110 Nausea: Secondary | ICD-10-CM

## 2019-03-07 DIAGNOSIS — I480 Paroxysmal atrial fibrillation: Secondary | ICD-10-CM | POA: Diagnosis not present

## 2019-03-07 DIAGNOSIS — E11649 Type 2 diabetes mellitus with hypoglycemia without coma: Secondary | ICD-10-CM

## 2019-03-07 DIAGNOSIS — R350 Frequency of micturition: Secondary | ICD-10-CM

## 2019-03-07 DIAGNOSIS — Z0000 Encounter for general adult medical examination without abnormal findings: Secondary | ICD-10-CM

## 2019-03-07 DIAGNOSIS — Z79899 Other long term (current) drug therapy: Secondary | ICD-10-CM

## 2019-03-07 DIAGNOSIS — K219 Gastro-esophageal reflux disease without esophagitis: Secondary | ICD-10-CM

## 2019-03-07 DIAGNOSIS — I10 Essential (primary) hypertension: Secondary | ICD-10-CM

## 2019-03-07 DIAGNOSIS — Z9989 Dependence on other enabling machines and devices: Secondary | ICD-10-CM

## 2019-03-07 DIAGNOSIS — Z794 Long term (current) use of insulin: Secondary | ICD-10-CM

## 2019-03-07 DIAGNOSIS — E1122 Type 2 diabetes mellitus with diabetic chronic kidney disease: Secondary | ICD-10-CM | POA: Diagnosis not present

## 2019-03-07 DIAGNOSIS — F321 Major depressive disorder, single episode, moderate: Secondary | ICD-10-CM

## 2019-03-07 DIAGNOSIS — G4733 Obstructive sleep apnea (adult) (pediatric): Secondary | ICD-10-CM

## 2019-03-07 DIAGNOSIS — G894 Chronic pain syndrome: Secondary | ICD-10-CM

## 2019-03-07 MED ORDER — METOPROLOL SUCCINATE ER 25 MG PO TB24: 25.0000 mg | ORAL_TABLET | Freq: Every day | ORAL | 11 refills | Status: AC

## 2019-03-07 MED ORDER — PROMETHAZINE HCL 25 MG PO TABS: 25.0000 mg | ORAL_TABLET | Freq: Three times a day (TID) | ORAL | 0 refills | Status: DC | PRN

## 2019-03-07 MED ORDER — APIXABAN 5 MG PO TABS: 5.0000 mg | ORAL_TABLET | Freq: Two times a day (BID) | ORAL | 1 refills | Status: AC

## 2019-03-07 NOTE — Assessment & Plan Note (Signed)
-  This problem is chronic and stable -Patient has been doing well off his omeprazole and denies any GERD symptoms at this time -We will continue to monitor him off omeprazole -No further work-up at this time

## 2019-03-07 NOTE — Patient Instructions (Signed)
-  It was a pleasure seeing you today -We will give you a tetanus shot today -I will refill Phenergan for you -Please continue to take your insulin as directed by endocrinology (Dr. Dwyane Dee) -We will check a bladder scan after urinating to see how distended your bladder is -We may need to refer you to urology for this -Please call me give any questions or concerns or difficulty obtaining any medication

## 2019-03-07 NOTE — Assessment & Plan Note (Addendum)
-  This problem is chronic and stable -He denies any palpitations or lightheadedness and appears to be in a regular rhythm on exam today -Patient states that he is compliant with Eliquis and Toprol-XL -Patient's Eliquis and Toprol-XL had fallen off his medication list after his colonoscopy but patient states that he has been taking his medications every day.  Refills of Eliquis and Toprol-XL were called into the pharmacy today as well and these were added back to his medication list -We will continue to monitor closely

## 2019-03-07 NOTE — Progress Notes (Signed)
   Subjective:    Patient ID: [name], male    DOB: September 15, 1950, 68 y.o.   MRN: [id number]  HPI  I have seen and examined this patient.  Patient is here for routine follow-up of his diabetes and hypertension.  Patient states that has been having nausea over the last couple weeks and saw his endocrinologist last week and was told to hold his Victoza for 1 week to see if this is the cause of his nausea.  Patient states that his nausea has slightly improved since holding the Victoza and that he is taking Phenergan as needed for this.  Patient also states that he has not been sleeping well as he has been waking up frequently at night to urinate.  He denies any other complaints at this time.  He is compliant with his medications.    Review of Systems  Constitutional: Negative.   HENT: Negative.   Respiratory: Negative.   Cardiovascular: Negative.   Gastrointestinal: Positive for nausea. Negative for abdominal pain, diarrhea and vomiting.  Genitourinary: Positive for frequency.  Musculoskeletal: Negative.   Neurological: Negative.   Psychiatric/Behavioral: Negative.        Objective:   Physical Exam Constitutional:      Appearance: Normal appearance.  HENT:     Head: Normocephalic and atraumatic.  Neck:     Musculoskeletal: Neck supple.  Cardiovascular:     Rate and Rhythm: Normal rate and regular rhythm.     Heart sounds: Normal heart sounds.  Pulmonary:     Effort: Pulmonary effort is normal.     Breath sounds: Normal breath sounds. No wheezing or rales.  Abdominal:     General: Bowel sounds are normal. There is no distension.     Palpations: Abdomen is soft.     Tenderness: There is no abdominal tenderness.  Musculoskeletal:        General: No tenderness.     Comments: Trace bilateral lower extremity pitting edema noted  Lymphadenopathy:     Cervical: No cervical adenopathy.  Neurological:     General: No focal deficit present.     Mental Status: He is alert  and oriented to person, place, and time.  Psychiatric:        Mood and Affect: Mood normal.        Behavior: Behavior normal.           Assessment & Plan:  Please see problem based charting for assessment and plan:

## 2019-03-07 NOTE — Assessment & Plan Note (Signed)
-  This problem is chronic and stable -Patient's PHQ 9 score was 3 but this was secondary to lack of sleep from having to urinate frequently -He states that his mood is improved and does not require any medications at this time -We will continue to monitor closely

## 2019-03-07 NOTE — Addendum Note (Signed)
Addended by: Marcelino Duster on: 03/07/2019 04:29 PM   Modules accepted: Orders

## 2019-03-07 NOTE — Assessment & Plan Note (Signed)
-  This problem has been chronic for the patient -However, patient did state that he was doing well earlier this year but over the last couple of weeks he has complained of worsening nausea which was relieved with Phenergan -After discussion with endocrinology Victoza was held as a possible culprit for his nausea.  Patient states that his nausea has slightly improved off of Victoza. -We will refill Phenergan for the patient -Given the patient's history of uncontrolled diabetes I am concerned for the possibility of gastroparesis as an etiology for his nausea -If his symptoms do not improve would consider gastric emptying study to evaluate for gastroparesis -No further work-up at this time

## 2019-03-07 NOTE — Assessment & Plan Note (Signed)
-  This problem is chronic and stable -Patient has been following up with a pain clinic for this -Patient is on hydrocodone as an outpatient and states that his pain is well controlled -Denies any other complaints at this time -No further work-up for now

## 2019-03-07 NOTE — Assessment & Plan Note (Signed)
-  Tdap given today -Patient to obtain flu shot at his pharmacy and let us know when he gets this

## 2019-03-07 NOTE — Assessment & Plan Note (Addendum)
BP Readings from Last 3 Encounters:  03/07/19 (!) 147/73  01/27/19 (!) 180/72  12/28/18 (!) 142/62    Lab Results  Component Value Date   NA 138 02/28/2019   K 4.0 02/28/2019   CREATININE 1.56 (H) 02/28/2019    Assessment: Blood pressure control:   Fair Progress toward BP goal:   Improved Comments: Patient is compliant with enalapril 40 mg daily and Toprol-XL 25 mg daily.  Patient's Toprol-XL had fallen off his medication list the patient states that he has been taking this medication every day.  Refill for Toprol-XL was called into his pharmacy and this was added back to his medication list.   Plan: Medications:  continue current medications Educational resources provided:   Self management tools provided:   Other plans:

## 2019-03-07 NOTE — Assessment & Plan Note (Addendum)
-  This problem is chronic and stable -Patient states that he has been following up with nephrology  -His creatinine has remained stable (checked last week) -Patient does complain of increased urinary frequency.  Bedside ultrasound done to evaluate prostate but prostate was not visualized on the scan and he did have a distended bladder. -PVR was checked after patient urinated which was only 20 mL -I suspect that the patient may have BPH and will discuss this with him at his follow-up visit -No further work-up at this time -We will continue to monitor closely

## 2019-03-07 NOTE — Assessment & Plan Note (Signed)
-  This problem is chronic and stable -Patient states that he is compliant with his CPAP at night -Patient to follow-up with sleep medicine as an outpatient -He denies any daytime somnolence but states that he has difficulty sleeping as he wakes up to use restroom -No further work-up at this time

## 2019-03-07 NOTE — Assessment & Plan Note (Signed)
-  This problem is chronic and stable -Patient follows up with endocrinology for this and his last A1c done last week was 9.8 -This is mildly improved from his prior 1 but continues to have uncontrolled blood sugars -Meter downloaded today showed elevated blood glucoses mainly at night but also after lunch -Patient was recently taken off Victoza secondary to his nausea -Dr. Dwyane Dee had increased his mealtime insulin with lunch and dinner but patient states that he has not started doing this yet.  I reminded him again the importance of him following up with endocrinology and making sure he institutes insulin changes as directed by his endocrinologist -Patient is in agreement with plan and will update his pre-meal insulin per endocrinology  -No further work-up at this time

## 2019-03-09 DIAGNOSIS — E113513 Type 2 diabetes mellitus with proliferative diabetic retinopathy with macular edema, bilateral: Secondary | ICD-10-CM | POA: Diagnosis not present

## 2019-03-13 DIAGNOSIS — M79672 Pain in left foot: Secondary | ICD-10-CM | POA: Diagnosis not present

## 2019-03-13 DIAGNOSIS — G894 Chronic pain syndrome: Secondary | ICD-10-CM | POA: Diagnosis not present

## 2019-03-13 DIAGNOSIS — Z79891 Long term (current) use of opiate analgesic: Secondary | ICD-10-CM | POA: Diagnosis not present

## 2019-03-13 DIAGNOSIS — M545 Low back pain: Secondary | ICD-10-CM | POA: Diagnosis not present

## 2019-03-17 DIAGNOSIS — L97521 Non-pressure chronic ulcer of other part of left foot limited to breakdown of skin: Secondary | ICD-10-CM | POA: Diagnosis not present

## 2019-03-17 DIAGNOSIS — E119 Type 2 diabetes mellitus without complications: Secondary | ICD-10-CM | POA: Diagnosis not present

## 2019-03-17 DIAGNOSIS — E11621 Type 2 diabetes mellitus with foot ulcer: Secondary | ICD-10-CM | POA: Diagnosis not present

## 2019-03-17 DIAGNOSIS — Z794 Long term (current) use of insulin: Secondary | ICD-10-CM | POA: Diagnosis not present

## 2019-03-21 ENCOUNTER — Other Ambulatory Visit: Payer: Self-pay | Admitting: Endocrinology

## 2019-03-21 LAB — HM DIABETES EYE EXAM

## 2019-03-22 ENCOUNTER — Encounter: Payer: Self-pay | Admitting: *Deleted

## 2019-03-22 ENCOUNTER — Other Ambulatory Visit: Payer: Self-pay | Admitting: Internal Medicine

## 2019-03-22 DIAGNOSIS — E1142 Type 2 diabetes mellitus with diabetic polyneuropathy: Secondary | ICD-10-CM

## 2019-03-22 DIAGNOSIS — I10 Essential (primary) hypertension: Secondary | ICD-10-CM

## 2019-03-22 NOTE — Telephone Encounter (Signed)
Called pharm, pt has refills available, they will fill and call pt

## 2019-03-22 NOTE — Telephone Encounter (Signed)
Need refill on eye drop,metoprolol succinate (TOPROL XL) 25 MG 24 hr tablet  gabapentin (NEURONTIN) 300 MG capsule enalapril (VASOTEC) 20 MG tablet  ;pt contact [place], [place] AT [place]

## 2019-03-24 DIAGNOSIS — G4733 Obstructive sleep apnea (adult) (pediatric): Secondary | ICD-10-CM | POA: Diagnosis not present

## 2019-03-30 ENCOUNTER — Other Ambulatory Visit: Payer: Self-pay | Admitting: Internal Medicine

## 2019-04-05 ENCOUNTER — Other Ambulatory Visit: Payer: Self-pay | Admitting: Internal Medicine

## 2019-04-05 DIAGNOSIS — I10 Essential (primary) hypertension: Secondary | ICD-10-CM

## 2019-04-05 NOTE — Telephone Encounter (Signed)
Note attached to rx: The original prescription was discontinued on 01/27/2019 by Carol Ada, MD for the following reason: Stop Taking at Discharge. Renewing this prescription may not be appropriate.

## 2019-04-08 ENCOUNTER — Other Ambulatory Visit: Payer: Self-pay | Admitting: Endocrinology

## 2019-04-08 ENCOUNTER — Other Ambulatory Visit: Payer: Self-pay | Admitting: Internal Medicine

## 2019-04-08 DIAGNOSIS — R110 Nausea: Secondary | ICD-10-CM

## 2019-04-09 ENCOUNTER — Other Ambulatory Visit: Payer: Self-pay | Admitting: Internal Medicine

## 2019-04-09 ENCOUNTER — Other Ambulatory Visit: Payer: Self-pay | Admitting: Endocrinology

## 2019-04-10 DIAGNOSIS — M79672 Pain in left foot: Secondary | ICD-10-CM | POA: Diagnosis not present

## 2019-04-10 DIAGNOSIS — M545 Low back pain: Secondary | ICD-10-CM | POA: Diagnosis not present

## 2019-04-10 DIAGNOSIS — G894 Chronic pain syndrome: Secondary | ICD-10-CM | POA: Diagnosis not present

## 2019-04-10 DIAGNOSIS — Z79891 Long term (current) use of opiate analgesic: Secondary | ICD-10-CM | POA: Diagnosis not present

## 2019-04-10 NOTE — Telephone Encounter (Signed)
Spoke with patient-states he still gets nauseous and have to take promethazine twice daily. Pt states Dr Dwyane Dee has decreased his Victoza to 1.6 mg (down from 1.8mg ) daily to see if this will help with nausea.  Will send refill request to pcp for review.[name]9/28/20201:36 PM  Pt also wanted to know about getting his "eye drops" refilled.  Pt instructed to contact his eye doctor for refills and he agreed.Marland KitchenMarland KitchenDespina Hidden Cassady9/28/20201:37 PM

## 2019-04-11 NOTE — Telephone Encounter (Signed)
Patient needs to have his ophthalmologist fill this

## 2019-04-12 NOTE — Telephone Encounter (Signed)
Pt was during 04/10/19 telephone call that he would need to contact his eye doctor for refills on his eye drops and pt agreed.[name]9/30/202012:45 PM

## 2019-04-13 ENCOUNTER — Other Ambulatory Visit: Payer: Self-pay | Admitting: Endocrinology

## 2019-04-13 ENCOUNTER — Telehealth: Payer: Self-pay | Admitting: Internal Medicine

## 2019-04-13 DIAGNOSIS — E113513 Type 2 diabetes mellitus with proliferative diabetic retinopathy with macular edema, bilateral: Secondary | ICD-10-CM | POA: Diagnosis not present

## 2019-04-13 NOTE — Telephone Encounter (Signed)
Refill Request  HUMULIN R 500 UNIT/ML injection   [place], [place]

## 2019-04-13 NOTE — Telephone Encounter (Signed)
This was refilled on 9/28

## 2019-04-16 DIAGNOSIS — L97521 Non-pressure chronic ulcer of other part of left foot limited to breakdown of skin: Secondary | ICD-10-CM | POA: Diagnosis not present

## 2019-04-16 DIAGNOSIS — E119 Type 2 diabetes mellitus without complications: Secondary | ICD-10-CM | POA: Diagnosis not present

## 2019-04-16 DIAGNOSIS — E11621 Type 2 diabetes mellitus with foot ulcer: Secondary | ICD-10-CM | POA: Diagnosis not present

## 2019-04-16 DIAGNOSIS — Z794 Long term (current) use of insulin: Secondary | ICD-10-CM | POA: Diagnosis not present

## 2019-04-28 ENCOUNTER — Encounter: Payer: Self-pay | Admitting: Endocrinology

## 2019-04-28 ENCOUNTER — Other Ambulatory Visit: Payer: Self-pay

## 2019-04-28 ENCOUNTER — Ambulatory Visit (INDEPENDENT_AMBULATORY_CARE_PROVIDER_SITE_OTHER): Payer: Medicare Other | Admitting: Endocrinology

## 2019-04-28 VITALS — BP 160/70 | HR 70 | Ht 78.0 in | Wt >= 6400 oz

## 2019-04-28 DIAGNOSIS — N183 Chronic kidney disease, stage 3 unspecified: Secondary | ICD-10-CM | POA: Diagnosis not present

## 2019-04-28 DIAGNOSIS — E11649 Type 2 diabetes mellitus with hypoglycemia without coma: Secondary | ICD-10-CM

## 2019-04-28 DIAGNOSIS — E1142 Type 2 diabetes mellitus with diabetic polyneuropathy: Secondary | ICD-10-CM

## 2019-04-28 DIAGNOSIS — E7800 Pure hypercholesterolemia, unspecified: Secondary | ICD-10-CM | POA: Diagnosis not present

## 2019-04-28 NOTE — Patient Instructions (Signed)
Take 1-2 clicks more before lunch than before  Reduce OJ and grapes  Check blood sugars on waking up 7 days a week  Also check blood sugars about 2 hours after meals and do this after different meals by rotation  Recommended blood sugar levels on waking up are 90-130 and about 2 hours after meal is 130-160  Please bring your blood sugar monitor to each visit, thank you

## 2019-04-28 NOTE — Progress Notes (Addendum)
Patient ID: [name], male   DOB: 02-23-1951, 68 y.o.   MRN: [id number]             Reason for Appointment: Follow-up for Type 2 Diabetes   History of Present Illness:          Date of diagnosis of type 2 diabetes mellitus: 2007       Background history:   He was diagnosed to have diabetes when he had an ulcer on his left third toe which led to amputation.  His glucose was apparently about 400 and he was started on insulin at that time He does not know if he has taken any oral hypoglycemic drugs in the past His A1c in 2016 has been consistently over 8% On his initial consultation was started on Victoza and subsequently started on U-500 insulin in 2/17 along with Toujeo INSULIN regimen previously was:  TOUJEO  74 in the morning and 74 in the evening HUMULIN U-500 insulin: 20/40 units in the morning, 60/70 at lunch and 70 at supper  Recent history:   Since 8/18 he has been using V-go PUMP   Currently using 20 unit basal, Humulin R U-500 insulin  Mealtime boluses 0-4 units breakfast, 12-16 units acl, 16-20 units at dinner  Non-insulin hypoglycemic drugs the patient is taking are: Victoza 0.6 mg daily         Current blood sugar patterns and problems identified:  His most recent A1c has been 9.8 in August   He has had less problems with his V-go pump not sticking and has been using it fairly consistently now  Currently he is continuing to use the freestyle libre sensor and this is active almost 100% of the time  More recently has eating mostly 2 meals a day  Blood sugar patterns are discussed in detail below  Again tends to have high postprandial readings but now mostly after months  Currently eating mostly 2 meals a day with the late breakfast meal about 12 noon  He has been trying at least at home lunchtime to bolus 30-minute before eating  Only rare hypoglycemia overnight  Is still not able to adjust his mealtime doses based on what he is eating doing  it mostly based on premeal blood sugar room  He was told to cut back temporarily on his Victoza because of nausea and vomiting on the previous visit  However even though he is feeling better he is still taking 0.6 mg instead of increasing it after 1 week of stopping   Glucose monitoring:  done 3-4  times a day         Glucometer:  freestyle libre 14-day   CONTINUOUS GLUCOSE MONITORING RECORD INTERPRETATION    Dates of Recording: 10/3 through 10/16  Sensor description: [employer]  Results statistics:   CGM use % of time 95%  Average and SD  155, GV 31  Time in range      72, was 65  % Time Above 180  23  % Time above 250  3  % Time Below target  2    Glycemic patterns summary: Blood sugars are within the target range overnight and only going higher around midday and at times in the evenings Has only rare mild hypoglycemia early morning around 5-6 AM He tries to monitor blood sugar several times a day  Hyperglycemic episodes are primarily after his first meal in the early afternoon and occasionally in the evenings with highest blood sugar on 10/3  Hypoglycemic episodes occurred only once early morning on 10/9, appears to be asymptomatic  Overnight periods: Blood sugars are fairly variable averaging 130-150 the early part of the night and somewhat lower until 10 AM  Preprandial periods: Blood sugars are excellent before his first meal around 11 AM-noon Blood sugars are variably high at dinnertime but mostly around 150-170  Postprandial periods:   Only eating 2 meals a day  After lunch: Blood sugars are periodically higher on several occasions generally going up to about 200 on average  After dinner: Blood sugars are not consistently high and generally start coming down after 11 PM   PRE-MEAL Fasting Lunch Dinner Bedtime Overall  Glucose range:       Mean/median:  121   178  170  155   POST-MEAL  PC Lunch PC Dinner  Glucose range:     Mean/median:   195  191    Previous data:  CGM use % of time  97  2-week average/SD  202+/-30  Time in range       42 % was 65  % Time Above 180  37  % Time above 250  21  % Time Below 70      Self-care:  Typical meal intake: Breakfast is sometimes fruit like banana or cereal, Sometimes eating sausage and grits Usually trying to avoid high-fat foods or fried foods  Breakfast 8 AM Lunch 12 noon Dinner at 5-6 pm He drinks cranberry or V-8 juice and diet drinks                Dietician consultation: 1/19               Exercise:  None, usually has chronic problems with foot ulcer  Weight history: Previous range 260-410  Wt Readings from Last 3 Encounters:  04/28/19 (!) 410 lb 6.4 oz (186.2 kg)  03/07/19 (!) 413 lb 3.2 oz (187.4 kg)  01/27/19 (!) 385 lb (174.6 kg)    Glycemic control:   Lab Results  Component Value Date   HGBA1C 9.8 (H) 02/28/2019   HGBA1C 10.5 (H) 11/14/2018   HGBA1C 9.0 (H) 08/12/2018   Lab Results  Component Value Date   MICROALBUR 32.7 (H) 12/26/2018   LDLCALC 55 05/13/2018   CREATININE 1.56 (H) 02/28/2019     OTHER problems review today: See review of systems      No visits with results within 1 Week(s) from this visit.  Latest known visit with results is:  Scanned Document on 03/22/2019  Component Date Value Ref Range Status  . HM Diabetic Eye Exam 03/21/2019 Retinopathy* No Retinopathy Final   seen by Dr Manuella Ghazi       Allergies as of 04/28/2019      Reactions   Vancomycin    REACTION: ARF      Medication List       Accurate as of April 28, 2019 11:59 PM. If you have any questions, ask your nurse or doctor.        Accu-Chek Aviva Plus w/Device Kit CHECK BLOOD SUGAR 5 TIMES A DAY   Accu-Chek FastClix Lancets Misc Check blood sugar 5 times a day   albuterol 108 (90 Base) MCG/ACT inhaler Commonly known as: ProAir HFA Inhale 2 puffs into the lungs every 6 (six) hours as needed for wheezing or shortness of breath.   apixaban 5 MG Tabs tablet  Commonly known as: Eliquis Take 1 tablet (5 mg total) by mouth 2 (two) times daily.  atorvastatin 20 MG tablet Commonly known as: LIPITOR TAKE 1 TABLET(20 MG) BY MOUTH DAILY   enalapril 20 MG tablet Commonly known as: VASOTEC Take 2 tablets (40 mg total) by mouth daily.   FreeStyle Libre 14 Day Sensor Misc 1 each by Does not apply route 6 (six) times daily.   furosemide 20 MG tablet Commonly known as: LASIX Take 1 tablet (20 mg total) by mouth daily.   gabapentin 300 MG capsule Commonly known as: NEURONTIN Take 1 capsule (300 mg total) by mouth at bedtime.   glucose blood test strip Commonly known as: FreeStyle Precision Neo Test 1 each by Other route as needed for other. Use as instructed to check blood sugar 2 times daily.   glucose blood test strip Commonly known as: Accu-Chek Aviva Plus USE TO CHECK BLOOD FIVE TIMES DAILY   HUMULIN R 500 UNIT/ML injection Generic drug: insulin regular human CONCENTRATED DRAW INSULIN TO THE 68 UNIT LINE ON THE U-100 SYRINGE(TO USE 340 UNITS) PER DAY AS DIRECTED   HYDROcodone-acetaminophen 7.5-325 MG tablet Commonly known as: NORCO Take 1 tablet by mouth every 8 (eight) hours as needed for moderate pain or severe pain.   hydroxypropyl methylcellulose / hypromellose 2.5 % ophthalmic solution Commonly known as: ISOPTO TEARS / GONIOVISC Place 1-2 drops into both eyes 3 (three) times daily as needed for dry eyes (dry/irritated eyes.).   INSULIN SYRINGE 1CC/29G 29G X 1/2" 1 ML Misc USE DAILY AS DIRECTED   metoprolol succinate 25 MG 24 hr tablet Commonly known as: Toprol XL Take 1 tablet (25 mg total) by mouth daily.   promethazine 25 MG tablet Commonly known as: PHENERGAN TAKE 1 TABLET(25 MG) BY MOUTH EVERY 8 HOURS AS NEEDED FOR NAUSEA OR VOMITING   Restasis 0.05 % ophthalmic emulsion Generic drug: cycloSPORINE Place 1 drop into both eyes daily.   Simbrinza 1-0.2 % Susp Generic drug: Brinzolamide-Brimonidine Place 1 drop into  both eyes 2 (two) times daily.   V-Go 20 Kit USE DAILY WITH INSULIN   Victoza 18 MG/3ML Sopn Generic drug: liraglutide ADMINISTER 1.8 MG UNDER THE SKIN AT THE SAME TIME EVERY DAY   Vitamin D3 50 MCG (2000 UT) Tabs Take 2,000 Units by mouth daily.       Allergies:  Allergies  Allergen Reactions  . Vancomycin     REACTION: ARF    Past Medical History:  Diagnosis Date  . Arthritis    "elbows & knees" (12/13/2014)  . Asthma   . CKD (chronic kidney disease), stage III   . DIABETIC FOOT ULCER 06/20/2009  . Edema, macular, due to secondary diabetes ([place])   . Erectile dysfunction   . GERD (gastroesophageal reflux disease)   . HEARING LOSS, SENSORINEURAL, BILATERAL 01/03/2007   Seen by ENT Dr. Orpah Greek D. Redmond Baseman 01/03/07  . Hemorrhoids   . History of echocardiogram    a. 04/2008 Echo: EF 50-55%, abnl LV relaxation, mildly dil LA.  Marland Kitchen Hyperlipidemia   . Hypertension   . Morbid obesity ([place])   . Neuropathy, lower extremity   . OSA (obstructive sleep apnea)    uses CPAP nightly  . OSA on CPAP    Nocturnal polysomnogram on 01/21/2010 showed severe obstructive sleep apnea/hypopnea syndrome, AHI 74.1 per hour with non positional events, moderately loud snoring, and oxygen desaturation to a nadir of 78% on room air.  CPAP was successfully titrated to 17 CWP, AHI 1.1 per hour using a large ResMed Mirage Quattro full-face mask with heated humidifier. Bruxism was noted.   Marland Kitchen  Osteomyelitis of ankle and foot ([place])   . Retinopathy   . Type II diabetes mellitus ([facility])    w/complication NOS, type II    Past Surgical History:  Procedure Laterality Date  . ACHILLES TENDON LENGTHENING Left 03/21/2018   Procedure: ACHILLES TENDON LENGTHENING;  Surgeon: Erle Crocker, MD;  Location: [facility];  Service: Orthopedics;  Laterality: Left;  . AMPUTATION Left 12/15/2014   Procedure: LEFT SECOND TOE AMPUTATION ;  Surgeon: Dorna Leitz, MD;  Location: [place];  Service: Orthopedics;  Laterality: Left;  .  AMPUTATION Left 03/21/2018   Procedure: TRANSMETATARSAL AMPUTATION LEFT FOOT;  Surgeon: Erle Crocker, MD;  Location: [facility];  Service: Orthopedics;  Laterality: Left;  Popliteal & ankle block  . COLONOSCOPY WITH PROPOFOL N/A 01/27/2019   Procedure: COLONOSCOPY WITH PROPOFOL;  Surgeon: Carol Ada, MD;  Location: [facility];  Service: Endoscopy;  Laterality: N/A;  . POLYPECTOMY  01/27/2019   Procedure: POLYPECTOMY;  Surgeon: Carol Ada, MD;  Location: [facility];  Service: Endoscopy;;  . 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 Kathalene Frames. Mayer Camel.  . WOUND DEBRIDEMENT Left 06/15/2016   Procedure: DEBRIDEMENT WOUND LEFT FOOT WITH GRAFT APPLICATION;  Surgeon: Landis Martins, DPM;  Location: [facility];  Service: Podiatry;  Laterality: Left;    Family History  Problem Relation Age of Onset  . Diabetes Mother        also 2 siblings  . Heart attack Father 50  . Throat cancer Brother     Social History:  reports that he quit smoking about 9 years ago. His smoking use included cigarettes. He has a 15.00 pack-year smoking history. He has never used smokeless tobacco. He reports current alcohol use of about 11.0 standard drinks of alcohol per week. He reports that he does not use drugs.    Review of Systems     Lipid management: taking Lipitor 20 mg, followed by PCP Also has low HDL    Lab Results  Component Value Date   CHOL 114 05/13/2018   HDL 29.00 (L) 05/13/2018   LDLCALC 55 05/13/2018   TRIG 146.0 05/13/2018   CHOLHDL 4 05/13/2018            Hypertension: Has had hypertension for a few years treated with enalapril,  taking 20 mg   Also on Lasix and 25 mg metoprolol, and  taking amlodipine 10 mg  This is treated by PCP/cardiologist. Control is not consistent  BP Readings from Last 3 Encounters:  04/28/19 (!) 160/70  03/07/19 (!) 147/73  01/27/19 (!) 180/72    He  has  chronic renal dysfunction, previously seen by nephrologist  Lab Results  Component Value Date   CREATININE 1.56 (H) 02/28/2019   CREATININE 1.94 (H) 12/26/2018   CREATININE 1.51 (H) 11/14/2018    He has had periodic nausea and vomiting, has been given promethazine as needed by his PCP, nausea mostly better   Physical Examination:  BP (!) 160/70 (BP Location: Left Arm, Patient Position: Sitting, Cuff Size: Normal)   Pulse 70   Ht '6\' 6"'$  (1.981 m)   Wt (!) 410 lb 6.4 oz (186.2 kg)   SpO2 98%   BMI 47.43 kg/m      ASSESSMENT:  Diabetes type 2, with morbid obesity   See history of present illness for detailed discussion of current diabetes management, blood sugar patterns and problems identified  He is on the  V-go pump and using U-500 insulin.  Also on Victoza as above  His last A1c was 9.8  Although his blood sugars were poorly controlled on his last visit they are generally improving except after meals He is taking somewhat more insulin for his boluses compared to before This may be partly related to his reducing the Victoza excessively  Blood sugars may be variably high based on amount of carbohydrate and certain high-fat foods also Again limited in ability to exercise  Reviewed his insulin management, abnormal blood sugar spikes and discussed adjustment of mealtime boluses Explained to him what targets he should have for after meals   PLAN:    He needs to increase his boluses by 2-4 units at his lunch meal at midday This should control better his blood sugars in the afternoon and early evening Bolus 30 minutes before each meal consistently We will cut back on high sugar foods such as grapes and cut back on juices Start increasing Victoza as discussed today, he can dial up another 5 clicks beyond 0.6 and then go to 1.2 mg until his next visit If his blood sugars are coming down significantly after dinner he can reduce his boluses at dinnertime May have a bedtime  snack if eating dinner orally Call if starting to get overnight hypoglycemia  Follow-up with PCP regarding lipids that need to be rechecked as well as hypertension  Follow-up in 2 months and A1c will be rechecked   Patient Instructions  Take 1-2 clicks more before lunch than before  Reduce OJ and grapes  Check blood sugars on waking up 7 days a week  Also check blood sugars about 2 hours after meals and do this after different meals by rotation  Recommended blood sugar levels on waking up are 90-130 and about 2 hours after meal is 130-160  Please bring your blood sugar monitor to each visit, thank you        Elayne Snare 04/30/2019, 5:32 PM   Note: This office note was prepared with Dragon voice recognition system technology. Any transcriptional errors that result from this process are unintentional.

## 2019-05-07 ENCOUNTER — Other Ambulatory Visit: Payer: Self-pay | Admitting: Internal Medicine

## 2019-05-08 ENCOUNTER — Other Ambulatory Visit: Payer: Self-pay | Admitting: Endocrinology

## 2019-05-08 ENCOUNTER — Other Ambulatory Visit: Payer: Self-pay | Admitting: Internal Medicine

## 2019-05-08 DIAGNOSIS — M79672 Pain in left foot: Secondary | ICD-10-CM | POA: Diagnosis not present

## 2019-05-08 DIAGNOSIS — Z79891 Long term (current) use of opiate analgesic: Secondary | ICD-10-CM | POA: Diagnosis not present

## 2019-05-08 DIAGNOSIS — M545 Low back pain: Secondary | ICD-10-CM | POA: Diagnosis not present

## 2019-05-08 DIAGNOSIS — R110 Nausea: Secondary | ICD-10-CM

## 2019-05-08 DIAGNOSIS — G894 Chronic pain syndrome: Secondary | ICD-10-CM | POA: Diagnosis not present

## 2019-05-10 NOTE — Telephone Encounter (Signed)
Requesting to speak with a nurse about getting refill on eye drop. Please call pt back.

## 2019-05-17 DIAGNOSIS — Z794 Long term (current) use of insulin: Secondary | ICD-10-CM | POA: Diagnosis not present

## 2019-05-17 DIAGNOSIS — E119 Type 2 diabetes mellitus without complications: Secondary | ICD-10-CM | POA: Diagnosis not present

## 2019-05-17 DIAGNOSIS — L97521 Non-pressure chronic ulcer of other part of left foot limited to breakdown of skin: Secondary | ICD-10-CM | POA: Diagnosis not present

## 2019-05-17 DIAGNOSIS — E11621 Type 2 diabetes mellitus with foot ulcer: Secondary | ICD-10-CM | POA: Diagnosis not present

## 2019-05-18 DIAGNOSIS — E113513 Type 2 diabetes mellitus with proliferative diabetic retinopathy with macular edema, bilateral: Secondary | ICD-10-CM | POA: Diagnosis not present

## 2019-05-23 ENCOUNTER — Other Ambulatory Visit: Payer: Self-pay | Admitting: Endocrinology

## 2019-06-05 DIAGNOSIS — M545 Low back pain: Secondary | ICD-10-CM | POA: Diagnosis not present

## 2019-06-05 DIAGNOSIS — G894 Chronic pain syndrome: Secondary | ICD-10-CM | POA: Diagnosis not present

## 2019-06-05 DIAGNOSIS — M79672 Pain in left foot: Secondary | ICD-10-CM | POA: Diagnosis not present

## 2019-06-05 DIAGNOSIS — Z79891 Long term (current) use of opiate analgesic: Secondary | ICD-10-CM | POA: Diagnosis not present

## 2019-06-06 ENCOUNTER — Other Ambulatory Visit: Payer: Self-pay

## 2019-06-06 ENCOUNTER — Ambulatory Visit (INDEPENDENT_AMBULATORY_CARE_PROVIDER_SITE_OTHER): Payer: Medicare Other | Admitting: Internal Medicine

## 2019-06-06 ENCOUNTER — Encounter: Payer: Self-pay | Admitting: Internal Medicine

## 2019-06-06 VITALS — BP 131/68 | HR 65 | Temp 97.7°F | Ht 78.0 in | Wt >= 6400 oz

## 2019-06-06 DIAGNOSIS — E1122 Type 2 diabetes mellitus with diabetic chronic kidney disease: Secondary | ICD-10-CM

## 2019-06-06 DIAGNOSIS — Z794 Long term (current) use of insulin: Secondary | ICD-10-CM

## 2019-06-06 DIAGNOSIS — E11649 Type 2 diabetes mellitus with hypoglycemia without coma: Secondary | ICD-10-CM | POA: Diagnosis not present

## 2019-06-06 DIAGNOSIS — G894 Chronic pain syndrome: Secondary | ICD-10-CM

## 2019-06-06 DIAGNOSIS — I10 Essential (primary) hypertension: Secondary | ICD-10-CM | POA: Diagnosis not present

## 2019-06-06 DIAGNOSIS — Z79899 Other long term (current) drug therapy: Secondary | ICD-10-CM

## 2019-06-06 DIAGNOSIS — N183 Chronic kidney disease, stage 3 unspecified: Secondary | ICD-10-CM | POA: Diagnosis not present

## 2019-06-06 DIAGNOSIS — I129 Hypertensive chronic kidney disease with stage 1 through stage 4 chronic kidney disease, or unspecified chronic kidney disease: Secondary | ICD-10-CM | POA: Diagnosis not present

## 2019-06-06 DIAGNOSIS — Z79891 Long term (current) use of opiate analgesic: Secondary | ICD-10-CM

## 2019-06-06 DIAGNOSIS — E559 Vitamin D deficiency, unspecified: Secondary | ICD-10-CM | POA: Diagnosis not present

## 2019-06-06 DIAGNOSIS — D649 Anemia, unspecified: Secondary | ICD-10-CM

## 2019-06-06 DIAGNOSIS — F321 Major depressive disorder, single episode, moderate: Secondary | ICD-10-CM

## 2019-06-06 DIAGNOSIS — F331 Major depressive disorder, recurrent, moderate: Secondary | ICD-10-CM

## 2019-06-06 DIAGNOSIS — Z7901 Long term (current) use of anticoagulants: Secondary | ICD-10-CM

## 2019-06-06 DIAGNOSIS — Z89422 Acquired absence of other left toe(s): Secondary | ICD-10-CM

## 2019-06-06 DIAGNOSIS — R110 Nausea: Secondary | ICD-10-CM

## 2019-06-06 DIAGNOSIS — D631 Anemia in chronic kidney disease: Secondary | ICD-10-CM

## 2019-06-06 DIAGNOSIS — I480 Paroxysmal atrial fibrillation: Secondary | ICD-10-CM

## 2019-06-06 DIAGNOSIS — E1142 Type 2 diabetes mellitus with diabetic polyneuropathy: Secondary | ICD-10-CM | POA: Diagnosis not present

## 2019-06-06 DIAGNOSIS — Z0000 Encounter for general adult medical examination without abnormal findings: Secondary | ICD-10-CM

## 2019-06-06 DIAGNOSIS — Z6841 Body Mass Index (BMI) 40.0 and over, adult: Secondary | ICD-10-CM

## 2019-06-06 LAB — POCT GLYCOSYLATED HEMOGLOBIN (HGB A1C): Hemoglobin A1C: 8.2 % — AB (ref 4.0–5.6)

## 2019-06-06 LAB — GLUCOSE, CAPILLARY: Glucose-Capillary: 126 mg/dL — ABNORMAL HIGH (ref 70–99)

## 2019-06-06 NOTE — Assessment & Plan Note (Signed)
-  Patient states that he got his flu shot at [facility] -We will attempt to obtain records of this

## 2019-06-06 NOTE — Progress Notes (Signed)
   Subjective:    Patient ID: [name], male    DOB: Apr 27, 1951, 68 y.o.   MRN: [id number]  HPI  I have seen and examined this patient.  Patient is here for routine follow-up of his hypertension and diabetes.  Patient states that he feels well currently.  He does state that he needs new diabetic shoes and has had difficulty obtaining this.  He denies any other complaints at this time.  He is compliant with all his medications.   Review of Systems  Constitutional: Negative.   HENT: Negative.   Respiratory: Negative.   Cardiovascular: Negative.   Gastrointestinal: Positive for nausea.       Patient does complain of chronic nausea  Musculoskeletal: Negative.   Skin: Negative.   Neurological: Negative.   Psychiatric/Behavioral: Negative.        Objective:   Physical Exam Constitutional:      Appearance: Normal appearance.  HENT:     Head: Normocephalic and atraumatic.  Neck:     Musculoskeletal: Neck supple.  Cardiovascular:     Rate and Rhythm: Normal rate and regular rhythm.     Heart sounds: Normal heart sounds.  Pulmonary:     Effort: Pulmonary effort is normal.     Breath sounds: Normal breath sounds. No wheezing or rales.  Abdominal:     General: Bowel sounds are normal. There is no distension.     Palpations: Abdomen is soft.     Tenderness: There is no abdominal tenderness.  Musculoskeletal:        General: Swelling present. No tenderness.     Comments: Trace to 1+ bilateral lower extremity pitting edema noted  Left forefoot amputation  Lymphadenopathy:     Cervical: No cervical adenopathy.  Neurological:     Mental Status: He is alert and oriented to person, place, and time.  Psychiatric:        Mood and Affect: Mood normal.        Behavior: Behavior normal.           Assessment & Plan:  Please see problem based charting for assessment and plan:

## 2019-06-06 NOTE — Assessment & Plan Note (Signed)
-  This problem is chronic and stable -Patient states that he is not been able to exercise much secondary to his left forefoot amputation and his inability to find shoes.  He states that his current walking boot causes him pain after a while -We will attempt to get new diabetic shoes for him -We discussed importance of weight loss in the overall management of his diabetes and hypertension -We will follow-up at next visit

## 2019-06-06 NOTE — Assessment & Plan Note (Signed)
-  This problem is chronic and stable -Patient states that he is compliant with his vitamin D supplementation -We will check repeat vitamin D level today

## 2019-06-06 NOTE — Assessment & Plan Note (Signed)
-  This problem is chronic and stable -Patient states that he does have chronic recurrent nausea and uses Phenergan twice a day at home for this -I explained the patient importance of using this medication only as needed and to not take it scheduled -Patient states that he wants to see how he does with elevated over Thanksgiving -We will follow-up with him at his next visit -If his nausea persists will consider referring him for a gastric emptying study given his chronic longstanding diabetes and his persistent nausea

## 2019-06-06 NOTE — Assessment & Plan Note (Signed)
-  This problem is chronic and stable -Patient follows up with the pain clinic for this and states that his pain is currently well controlled -He is currently on hydrocodone as an outpatient -No further work-up at this time

## 2019-06-06 NOTE — Assessment & Plan Note (Signed)
-  Patient has a history of chronic anemia likely secondary to CKD -Patient was noted to have worsening anemia on his last admission for diabetic foot ulcer -We will check a repeat CBC today -No further work-up at this time

## 2019-06-06 NOTE — Assessment & Plan Note (Addendum)
-  This problem is chronic and stable -Patient follows up with endocrinology for this and had his A1c done on this visit -Patient A1c is improved from 9.8 to 8.2 -We will continue with current insulin regimen and Victoza per Dr. Dwyane Dee -Will put in a DME order for diabetic shoes for the patient.  Patient has a history of left forefoot amputation following a diabetic ulcer and needs appropriate footwear to prevent this from recurring -We will also check a lipid panel today.  Continue with atorvastatin 20 mg daily for now

## 2019-06-06 NOTE — Assessment & Plan Note (Signed)
-  Blood pressure is currently well controlled (131/68) -Patient states that he has been following a good diet and taking all his medications. -Patient is compliant with Lasix 20 mg daily, enalapril 40 mg daily as well as metoprolol succinate 25 mg daily -We will continue with current medications for now -No further work-up at this time

## 2019-06-06 NOTE — Assessment & Plan Note (Signed)
-  This problem is chronic and stable -Patient states that he does have some stress at home but that he is able to cope with this well -His PHQ-9 score today was 1 -Patient is currently not on any medications for this and will continue to monitor him off of this -I offered to refer him to our behavioral health clinician but he refuses at this time

## 2019-06-06 NOTE — Patient Instructions (Signed)
-  It was a pleasure seeing you today -Your A1c has improved.  Keep up the great work! -We will work on trying to get your diabetic shoes.  Please continue to work on your diet and exercise -We will check some blood work on you today including your vitamin D levels as well as your blood count and your cholesterol -Blood pressure is doing well today.  Keep up the great work! -Please let me know if have any issues with the gabapentin.  We will discuss your neuropathy at your follow-up visit again -Please use your nausea medication only as needed -Have a great holiday season!

## 2019-06-06 NOTE — Assessment & Plan Note (Signed)
-  This problem is chronic and stable -Patient denies any urinary complaints currently -Patient has an appointment to follow-up with nephrology in the next couple of weeks -His creatinine has remained stable -No further work-up at this time

## 2019-06-06 NOTE — Assessment & Plan Note (Signed)
-  This problem is chronic and stable -Patient denies any palpitations or lightheadedness and appears to be in regular rhythm on exam -We will continue with Eliquis and Toprol-XL -No further work-up for now

## 2019-06-06 NOTE — Assessment & Plan Note (Signed)
-  Patient has a history of diabetic neuropathy -He states that this was well controlled and he stopped taking his gabapentin as this was occasionally making him dizzy -He states that his neuropathy is worsened over the last week and over the last 3 nights he is taking gabapentin and it has not caused him to have recurrent dizziness -We will continue gabapentin for now -If he does have recurrent dizziness would consider trying Lyrica instead -No further work-up at this time

## 2019-06-07 ENCOUNTER — Other Ambulatory Visit: Payer: Self-pay | Admitting: Endocrinology

## 2019-06-07 ENCOUNTER — Other Ambulatory Visit: Payer: Self-pay | Admitting: Internal Medicine

## 2019-06-07 ENCOUNTER — Telehealth: Payer: Self-pay | Admitting: Internal Medicine

## 2019-06-07 DIAGNOSIS — I10 Essential (primary) hypertension: Secondary | ICD-10-CM

## 2019-06-07 DIAGNOSIS — E1142 Type 2 diabetes mellitus with diabetic polyneuropathy: Secondary | ICD-10-CM

## 2019-06-07 DIAGNOSIS — R110 Nausea: Secondary | ICD-10-CM

## 2019-06-07 LAB — LIPID PANEL
Chol/HDL Ratio: 3.4 ratio (ref 0.0–5.0)
Cholesterol, Total: 119 mg/dL (ref 100–199)
HDL: 35 mg/dL — ABNORMAL LOW (ref >39)
LDL Chol Calc (NIH): 63 mg/dL (ref 0–99)
Triglycerides: 113 mg/dL (ref 0–149)
VLDL Cholesterol Cal: 21 mg/dL (ref 5–40)

## 2019-06-07 LAB — CBC WITH DIFFERENTIAL/PLATELET
Basophils Absolute: 0 10*3/uL (ref 0.0–0.2)
Basos: 1 %
EOS (ABSOLUTE): 0.1 10*3/uL (ref 0.0–0.4)
Eos: 2 %
Hematocrit: 36.5 % — ABNORMAL LOW (ref 37.5–51.0)
Hemoglobin: 12 g/dL — ABNORMAL LOW (ref 13.0–17.7)
Immature Grans (Abs): 0 10*3/uL (ref 0.0–0.1)
Immature Granulocytes: 0 %
Lymphocytes Absolute: 1.9 10*3/uL (ref 0.7–3.1)
Lymphs: 34 %
MCH: 29.1 pg (ref 26.6–33.0)
MCHC: 32.9 g/dL (ref 31.5–35.7)
MCV: 88 fL (ref 79–97)
Monocytes Absolute: 0.6 10*3/uL (ref 0.1–0.9)
Monocytes: 10 %
Neutrophils Absolute: 2.9 10*3/uL (ref 1.4–7.0)
Neutrophils: 53 %
Platelets: 143 10*3/uL — ABNORMAL LOW (ref 150–450)
RBC: 4.13 x10E6/uL — ABNORMAL LOW (ref 4.14–5.80)
RDW: 13.1 % (ref 11.6–15.4)
WBC: 5.6 10*3/uL (ref 3.4–10.8)

## 2019-06-07 LAB — VITAMIN D 25 HYDROXY (VIT D DEFICIENCY, FRACTURES): Vit D, 25-Hydroxy: 31.9 ng/mL (ref 30.0–100.0)

## 2019-06-07 NOTE — Telephone Encounter (Signed)
I called the patient to discuss the results of his blood work with him.  Patient's anemia has improved with his hemoglobin increasing to 12 from 8 on his last check.  Patient also noted to have a low normal level of vitamin D and was encouraged to continue with his vitamin D supplementation.  Patient's lipid profile showed an HDL cholesterol of 35 but an LDL of 63.  We will continue with current dose of Lipitor 20 mg.  We will consider increasing this to 40 mg at his next visit.  Patient expresses understanding and is in agreement with plan.

## 2019-06-14 ENCOUNTER — Telehealth: Payer: Self-pay

## 2019-06-14 DIAGNOSIS — N183 Chronic kidney disease, stage 3 unspecified: Secondary | ICD-10-CM | POA: Diagnosis not present

## 2019-06-14 NOTE — Telephone Encounter (Signed)
I faxed office notes demographic sheet to [employer] for Diabetic shoes.Fax number is [phone].The patient is aware of this

## 2019-06-20 DIAGNOSIS — R319 Hematuria, unspecified: Secondary | ICD-10-CM | POA: Diagnosis not present

## 2019-06-20 DIAGNOSIS — R809 Proteinuria, unspecified: Secondary | ICD-10-CM | POA: Diagnosis not present

## 2019-06-20 DIAGNOSIS — N1832 Chronic kidney disease, stage 3b: Secondary | ICD-10-CM | POA: Diagnosis not present

## 2019-06-20 DIAGNOSIS — E1122 Type 2 diabetes mellitus with diabetic chronic kidney disease: Secondary | ICD-10-CM | POA: Diagnosis not present

## 2019-06-20 DIAGNOSIS — I129 Hypertensive chronic kidney disease with stage 1 through stage 4 chronic kidney disease, or unspecified chronic kidney disease: Secondary | ICD-10-CM | POA: Diagnosis not present

## 2019-06-23 ENCOUNTER — Telehealth: Payer: Self-pay | Admitting: Dietician

## 2019-06-23 NOTE — Telephone Encounter (Signed)
Took message from patient that he wants to get in touch with his doctor or nurse about "Ever since he went to kidney doctor on Tuesday, he has been in a lot of pain. The pain  Is in his  buttocks, both sides, going down back of legs and up his back some too and lower legs, feet, cannot turn in bed or stand without pain."   Told him I would route this note to our triage nurse and doctor and do the best we can to get back to him today.

## 2019-06-23 NOTE — Telephone Encounter (Signed)
Dr Dareen Piano,  Can you call this patient?

## 2019-06-23 NOTE — Telephone Encounter (Signed)
I called the patient to discuss his new onset back pain with him.  Patient states that he went to the nephrologist on Tuesday and was feeling well but when he returned home developed sudden onset of pain in his buttocks and lower back and radiating down the back of his legs.  Patient also states that he has mild weakness in his lower extremities especially on attempting to stand.  He denies any tingling or numbness in his lower extremities, no fevers or chills, no bowel or bladder incontinence, no trauma, denies lifting anything heavy recently.  I explained to the patient that I am concerned about this new onset back pain with mild weakness and that he should be evaluated in the ED today if possible.  Patient states that he cannot go to the ED today but will try and go tomorrow.  Patient cautioned on warning signs including worsening weakness, fevers or chills, bowel or bladder incontinence, tingling or numbness.  I explained to him that if these warning signs develop he should go to the ED immediately.  I also explained to the patient that if he decides not to go to the ED we will set up an appointment in [facility] on Monday for him and would last the front desk to call him for this appointment.  Patient stresses understanding and is in agreement with plan.

## 2019-06-26 NOTE — Telephone Encounter (Signed)
Spoke with the pt this morning. He did not go to the ED over the weekend. Pt is unable t to come in today due to transportation arrangements per SCAT of 1 day notice required. He has scheduled his appointment for Tuesday 06/26/2019 with [facility] @ 1:45 pm instead.

## 2019-06-26 NOTE — Telephone Encounter (Signed)
Thank you :)

## 2019-06-27 ENCOUNTER — Other Ambulatory Visit (HOSPITAL_COMMUNITY): Payer: Self-pay | Admitting: General Surgery

## 2019-06-27 ENCOUNTER — Ambulatory Visit (INDEPENDENT_AMBULATORY_CARE_PROVIDER_SITE_OTHER): Payer: Medicare Other | Admitting: Internal Medicine

## 2019-06-27 ENCOUNTER — Other Ambulatory Visit: Payer: Self-pay

## 2019-06-27 ENCOUNTER — Ambulatory Visit (HOSPITAL_COMMUNITY)
Admission: RE | Admit: 2019-06-27 | Discharge: 2019-06-27 | Disposition: A | Discharge status: Home / Self Care | Payer: Medicare Other | Source: Ambulatory Visit | Attending: Internal Medicine | Admitting: Internal Medicine

## 2019-06-27 ENCOUNTER — Inpatient Hospital Stay (HOSPITAL_COMMUNITY)
Admission: AD | Admit: 2019-06-27 | Discharge: 2019-08-14 | DRG: 208 | Disposition: E | Payer: Medicare Other | Source: Ambulatory Visit | Attending: Internal Medicine | Admitting: Internal Medicine

## 2019-06-27 ENCOUNTER — Encounter: Payer: Self-pay | Admitting: Internal Medicine

## 2019-06-27 ENCOUNTER — Other Ambulatory Visit (HOSPITAL_COMMUNITY): Payer: Self-pay | Admitting: Internal Medicine

## 2019-06-27 DIAGNOSIS — R0902 Hypoxemia: Secondary | ICD-10-CM

## 2019-06-27 DIAGNOSIS — J159 Unspecified bacterial pneumonia: Secondary | ICD-10-CM | POA: Diagnosis not present

## 2019-06-27 DIAGNOSIS — M19021 Primary osteoarthritis, right elbow: Secondary | ICD-10-CM | POA: Diagnosis present

## 2019-06-27 DIAGNOSIS — G92 Toxic encephalopathy: Secondary | ICD-10-CM | POA: Diagnosis not present

## 2019-06-27 DIAGNOSIS — E1140 Type 2 diabetes mellitus with diabetic neuropathy, unspecified: Secondary | ICD-10-CM | POA: Diagnosis present

## 2019-06-27 DIAGNOSIS — J1289 Other viral pneumonia: Secondary | ICD-10-CM | POA: Diagnosis not present

## 2019-06-27 DIAGNOSIS — R197 Diarrhea, unspecified: Secondary | ICD-10-CM | POA: Diagnosis not present

## 2019-06-27 DIAGNOSIS — Z794 Long term (current) use of insulin: Secondary | ICD-10-CM | POA: Diagnosis not present

## 2019-06-27 DIAGNOSIS — I4891 Unspecified atrial fibrillation: Secondary | ICD-10-CM | POA: Diagnosis not present

## 2019-06-27 DIAGNOSIS — Z515 Encounter for palliative care: Secondary | ICD-10-CM | POA: Diagnosis not present

## 2019-06-27 DIAGNOSIS — J45909 Unspecified asthma, uncomplicated: Secondary | ICD-10-CM | POA: Diagnosis present

## 2019-06-27 DIAGNOSIS — R34 Anuria and oliguria: Secondary | ICD-10-CM | POA: Diagnosis not present

## 2019-06-27 DIAGNOSIS — K219 Gastro-esophageal reflux disease without esophagitis: Secondary | ICD-10-CM | POA: Diagnosis present

## 2019-06-27 DIAGNOSIS — E1122 Type 2 diabetes mellitus with diabetic chronic kidney disease: Secondary | ICD-10-CM | POA: Diagnosis present

## 2019-06-27 DIAGNOSIS — Z20822 Contact with and (suspected) exposure to covid-19: Secondary | ICD-10-CM

## 2019-06-27 DIAGNOSIS — J189 Pneumonia, unspecified organism: Secondary | ICD-10-CM

## 2019-06-27 DIAGNOSIS — K760 Fatty (change of) liver, not elsewhere classified: Secondary | ICD-10-CM | POA: Diagnosis present

## 2019-06-27 DIAGNOSIS — I129 Hypertensive chronic kidney disease with stage 1 through stage 4 chronic kidney disease, or unspecified chronic kidney disease: Secondary | ICD-10-CM

## 2019-06-27 DIAGNOSIS — R42 Dizziness and giddiness: Secondary | ICD-10-CM | POA: Diagnosis not present

## 2019-06-27 DIAGNOSIS — Z6841 Body Mass Index (BMI) 40.0 and over, adult: Secondary | ICD-10-CM

## 2019-06-27 DIAGNOSIS — E11621 Type 2 diabetes mellitus with foot ulcer: Secondary | ICD-10-CM | POA: Diagnosis not present

## 2019-06-27 DIAGNOSIS — U071 COVID-19: Secondary | ICD-10-CM | POA: Diagnosis not present

## 2019-06-27 DIAGNOSIS — E874 Mixed disorder of acid-base balance: Secondary | ICD-10-CM | POA: Diagnosis not present

## 2019-06-27 DIAGNOSIS — Z4659 Encounter for fitting and adjustment of other gastrointestinal appliance and device: Secondary | ICD-10-CM

## 2019-06-27 DIAGNOSIS — R7401 Elevation of levels of liver transaminase levels: Secondary | ICD-10-CM | POA: Diagnosis not present

## 2019-06-27 DIAGNOSIS — N179 Acute kidney failure, unspecified: Secondary | ICD-10-CM | POA: Diagnosis present

## 2019-06-27 DIAGNOSIS — IMO0002 Reserved for concepts with insufficient information to code with codable children: Secondary | ICD-10-CM | POA: Diagnosis present

## 2019-06-27 DIAGNOSIS — R531 Weakness: Secondary | ICD-10-CM | POA: Diagnosis not present

## 2019-06-27 DIAGNOSIS — Z89422 Acquired absence of other left toe(s): Secondary | ICD-10-CM

## 2019-06-27 DIAGNOSIS — M7981 Nontraumatic hematoma of soft tissue: Secondary | ICD-10-CM | POA: Diagnosis not present

## 2019-06-27 DIAGNOSIS — M19022 Primary osteoarthritis, left elbow: Secondary | ICD-10-CM | POA: Diagnosis present

## 2019-06-27 DIAGNOSIS — J9601 Acute respiratory failure with hypoxia: Secondary | ICD-10-CM | POA: Diagnosis not present

## 2019-06-27 DIAGNOSIS — G4733 Obstructive sleep apnea (adult) (pediatric): Secondary | ICD-10-CM

## 2019-06-27 DIAGNOSIS — J80 Acute respiratory distress syndrome: Secondary | ICD-10-CM | POA: Diagnosis not present

## 2019-06-27 DIAGNOSIS — D696 Thrombocytopenia, unspecified: Secondary | ICD-10-CM | POA: Diagnosis present

## 2019-06-27 DIAGNOSIS — H905 Unspecified sensorineural hearing loss: Secondary | ICD-10-CM | POA: Diagnosis present

## 2019-06-27 DIAGNOSIS — R0602 Shortness of breath: Secondary | ICD-10-CM

## 2019-06-27 DIAGNOSIS — Z66 Do not resuscitate: Secondary | ICD-10-CM | POA: Diagnosis not present

## 2019-06-27 DIAGNOSIS — N183 Chronic kidney disease, stage 3 unspecified: Secondary | ICD-10-CM

## 2019-06-27 DIAGNOSIS — R55 Syncope and collapse: Secondary | ICD-10-CM

## 2019-06-27 DIAGNOSIS — I1310 Hypertensive heart and chronic kidney disease without heart failure, with stage 1 through stage 4 chronic kidney disease, or unspecified chronic kidney disease: Secondary | ICD-10-CM | POA: Diagnosis not present

## 2019-06-27 DIAGNOSIS — E1165 Type 2 diabetes mellitus with hyperglycemia: Secondary | ICD-10-CM | POA: Diagnosis present

## 2019-06-27 DIAGNOSIS — Z01818 Encounter for other preprocedural examination: Secondary | ICD-10-CM

## 2019-06-27 DIAGNOSIS — E662 Morbid (severe) obesity with alveolar hypoventilation: Secondary | ICD-10-CM | POA: Diagnosis present

## 2019-06-27 DIAGNOSIS — E0842 Diabetes mellitus due to underlying condition with diabetic polyneuropathy: Secondary | ICD-10-CM | POA: Diagnosis not present

## 2019-06-27 DIAGNOSIS — Z7901 Long term (current) use of anticoagulants: Secondary | ICD-10-CM

## 2019-06-27 DIAGNOSIS — J9691 Respiratory failure, unspecified with hypoxia: Secondary | ICD-10-CM | POA: Insufficient documentation

## 2019-06-27 DIAGNOSIS — J1282 Pneumonia due to coronavirus disease 2019: Secondary | ICD-10-CM | POA: Diagnosis not present

## 2019-06-27 DIAGNOSIS — Z9989 Dependence on other enabling machines and devices: Secondary | ICD-10-CM

## 2019-06-27 DIAGNOSIS — J984 Other disorders of lung: Secondary | ICD-10-CM | POA: Diagnosis not present

## 2019-06-27 DIAGNOSIS — E11311 Type 2 diabetes mellitus with unspecified diabetic retinopathy with macular edema: Secondary | ICD-10-CM | POA: Diagnosis present

## 2019-06-27 DIAGNOSIS — E785 Hyperlipidemia, unspecified: Secondary | ICD-10-CM | POA: Diagnosis present

## 2019-06-27 DIAGNOSIS — R0600 Dyspnea, unspecified: Secondary | ICD-10-CM | POA: Diagnosis not present

## 2019-06-27 DIAGNOSIS — Z452 Encounter for adjustment and management of vascular access device: Secondary | ICD-10-CM | POA: Diagnosis not present

## 2019-06-27 DIAGNOSIS — Z9641 Presence of insulin pump (external) (internal): Secondary | ICD-10-CM | POA: Diagnosis not present

## 2019-06-27 DIAGNOSIS — A4102 Sepsis due to Methicillin resistant Staphylococcus aureus: Secondary | ICD-10-CM | POA: Diagnosis not present

## 2019-06-27 DIAGNOSIS — R6521 Severe sepsis with septic shock: Secondary | ICD-10-CM | POA: Diagnosis not present

## 2019-06-27 DIAGNOSIS — I480 Paroxysmal atrial fibrillation: Secondary | ICD-10-CM | POA: Diagnosis not present

## 2019-06-27 DIAGNOSIS — Z79899 Other long term (current) drug therapy: Secondary | ICD-10-CM

## 2019-06-27 DIAGNOSIS — H409 Unspecified glaucoma: Secondary | ICD-10-CM | POA: Diagnosis present

## 2019-06-27 DIAGNOSIS — R7989 Other specified abnormal findings of blood chemistry: Secondary | ICD-10-CM | POA: Diagnosis present

## 2019-06-27 DIAGNOSIS — Z4682 Encounter for fitting and adjustment of non-vascular catheter: Secondary | ICD-10-CM | POA: Diagnosis not present

## 2019-06-27 DIAGNOSIS — Z881 Allergy status to other antibiotic agents status: Secondary | ICD-10-CM | POA: Diagnosis not present

## 2019-06-27 DIAGNOSIS — G4700 Insomnia, unspecified: Secondary | ICD-10-CM | POA: Diagnosis present

## 2019-06-27 DIAGNOSIS — M170 Bilateral primary osteoarthritis of knee: Secondary | ICD-10-CM | POA: Diagnosis present

## 2019-06-27 DIAGNOSIS — S20212D Contusion of left front wall of thorax, subsequent encounter: Secondary | ICD-10-CM | POA: Diagnosis not present

## 2019-06-27 DIAGNOSIS — N182 Chronic kidney disease, stage 2 (mild): Secondary | ICD-10-CM

## 2019-06-27 DIAGNOSIS — L97521 Non-pressure chronic ulcer of other part of left foot limited to breakdown of skin: Secondary | ICD-10-CM | POA: Diagnosis not present

## 2019-06-27 DIAGNOSIS — E118 Type 2 diabetes mellitus with unspecified complications: Secondary | ICD-10-CM | POA: Diagnosis not present

## 2019-06-27 DIAGNOSIS — E119 Type 2 diabetes mellitus without complications: Secondary | ICD-10-CM | POA: Diagnosis not present

## 2019-06-27 DIAGNOSIS — G894 Chronic pain syndrome: Secondary | ICD-10-CM | POA: Diagnosis present

## 2019-06-27 DIAGNOSIS — R05 Cough: Secondary | ICD-10-CM | POA: Diagnosis not present

## 2019-06-27 DIAGNOSIS — R791 Abnormal coagulation profile: Secondary | ICD-10-CM | POA: Diagnosis not present

## 2019-06-27 DIAGNOSIS — D62 Acute posthemorrhagic anemia: Secondary | ICD-10-CM | POA: Diagnosis not present

## 2019-06-27 DIAGNOSIS — Z0189 Encounter for other specified special examinations: Secondary | ICD-10-CM

## 2019-06-27 DIAGNOSIS — I959 Hypotension, unspecified: Secondary | ICD-10-CM | POA: Diagnosis not present

## 2019-06-27 DIAGNOSIS — I10 Essential (primary) hypertension: Secondary | ICD-10-CM | POA: Diagnosis present

## 2019-06-27 DIAGNOSIS — R918 Other nonspecific abnormal finding of lung field: Secondary | ICD-10-CM | POA: Diagnosis not present

## 2019-06-27 DIAGNOSIS — L8990 Pressure ulcer of unspecified site, unspecified stage: Secondary | ICD-10-CM | POA: Diagnosis present

## 2019-06-27 DIAGNOSIS — E1142 Type 2 diabetes mellitus with diabetic polyneuropathy: Secondary | ICD-10-CM | POA: Diagnosis present

## 2019-06-27 DIAGNOSIS — Z978 Presence of other specified devices: Secondary | ICD-10-CM

## 2019-06-27 LAB — CBC WITH DIFFERENTIAL/PLATELET
Abs Immature Granulocytes: 0.03 10*3/uL (ref 0.00–0.07)
Basophils Absolute: 0 10*3/uL (ref 0.0–0.1)
Basophils Relative: 0 %
Eosinophils Absolute: 0 10*3/uL (ref 0.0–0.5)
Eosinophils Relative: 0 %
HCT: 36.2 % — ABNORMAL LOW (ref 39.0–52.0)
Hemoglobin: 11.5 g/dL — ABNORMAL LOW (ref 13.0–17.0)
Immature Granulocytes: 1 %
Lymphocytes Relative: 14 %
Lymphs Abs: 0.7 10*3/uL (ref 0.7–4.0)
MCH: 28.5 pg (ref 26.0–34.0)
MCHC: 31.8 g/dL (ref 30.0–36.0)
MCV: 89.6 fL (ref 80.0–100.0)
Monocytes Absolute: 0.2 10*3/uL (ref 0.1–1.0)
Monocytes Relative: 4 %
Neutro Abs: 3.9 10*3/uL (ref 1.7–7.7)
Neutrophils Relative %: 81 %
Platelets: 108 10*3/uL — ABNORMAL LOW (ref 150–400)
RBC: 4.04 MIL/uL — ABNORMAL LOW (ref 4.22–5.81)
RDW: 14.6 % (ref 11.5–15.5)
WBC: 4.8 10*3/uL (ref 4.0–10.5)
nRBC: 0 % (ref 0.0–0.2)

## 2019-06-27 LAB — GLUCOSE, CAPILLARY: Glucose-Capillary: 263 mg/dL — ABNORMAL HIGH (ref 70–99)

## 2019-06-27 LAB — COMPREHENSIVE METABOLIC PANEL
ALT: 47 U/L — ABNORMAL HIGH (ref 0–44)
AST: 111 U/L — ABNORMAL HIGH (ref 15–41)
Albumin: 2.7 g/dL — ABNORMAL LOW (ref 3.5–5.0)
Alkaline Phosphatase: 76 U/L (ref 38–126)
Anion gap: 13 (ref 5–15)
BUN: 42 mg/dL — ABNORMAL HIGH (ref 8–23)
CO2: 22 mmol/L (ref 22–32)
Calcium: 7.6 mg/dL — ABNORMAL LOW (ref 8.9–10.3)
Chloride: 100 mmol/L (ref 98–111)
Creatinine, Ser: 3.06 mg/dL — ABNORMAL HIGH (ref 0.61–1.24)
GFR calc Af Amer: 23 mL/min — ABNORMAL LOW (ref >60)
GFR calc non Af Amer: 20 mL/min — ABNORMAL LOW (ref >60)
Glucose, Bld: 265 mg/dL — ABNORMAL HIGH (ref 70–99)
Potassium: 4.4 mmol/L (ref 3.5–5.1)
Sodium: 135 mmol/L (ref 135–145)
Total Bilirubin: 1.1 mg/dL (ref 0.3–1.2)
Total Protein: 6.5 g/dL (ref 6.5–8.1)

## 2019-06-27 LAB — D-DIMER, QUANTITATIVE: D-Dimer, Quant: 1.55 ug/mL-FEU — ABNORMAL HIGH (ref 0.00–0.50)

## 2019-06-27 LAB — FIBRINOGEN: Fibrinogen: 754 mg/dL — ABNORMAL HIGH (ref 210–475)

## 2019-06-27 LAB — PROCALCITONIN: Procalcitonin: 1.4 ng/mL

## 2019-06-27 LAB — RESPIRATORY PANEL BY RT PCR (FLU A&B, COVID)
Influenza A by PCR: NEGATIVE
Influenza B by PCR: NEGATIVE
SARS Coronavirus 2 by RT PCR: POSITIVE — AB

## 2019-06-27 LAB — BRAIN NATRIURETIC PEPTIDE: B Natriuretic Peptide: 208.9 pg/mL — ABNORMAL HIGH (ref 0.0–100.0)

## 2019-06-27 LAB — HIV ANTIBODY (ROUTINE TESTING W REFLEX): HIV Screen 4th Generation wRfx: NONREACTIVE

## 2019-06-27 LAB — C-REACTIVE PROTEIN: CRP: 22.2 mg/dL — ABNORMAL HIGH (ref <1.0)

## 2019-06-27 LAB — LACTATE DEHYDROGENASE: LDH: 695 U/L — ABNORMAL HIGH (ref 98–192)

## 2019-06-27 LAB — LACTIC ACID, PLASMA: Lactic Acid, Venous: 1.7 mmol/L (ref 0.5–1.9)

## 2019-06-27 LAB — FERRITIN: Ferritin: 719 ng/mL — ABNORMAL HIGH (ref 24–336)

## 2019-06-27 MED ORDER — DEXAMETHASONE SODIUM PHOSPHATE 10 MG/ML IJ SOLN
6.0000 mg | INTRAMUSCULAR | Status: DC
  Administered 2019-06-27 – 2019-06-28 (×2): 6 mg via INTRAVENOUS
  Filled 2019-06-27 (×2): qty 1

## 2019-06-27 MED ORDER — SODIUM CHLORIDE 0.9 % IV BOLUS
1000.0000 mL | Freq: Once | INTRAVENOUS | Status: AC
  Administered 2019-06-27: 15:00:00 1000 mL via INTRAVENOUS

## 2019-06-27 MED ORDER — INSULIN GLARGINE 100 UNIT/ML ~~LOC~~ SOLN
40.0000 [IU] | Freq: Every day | SUBCUTANEOUS | Status: DC
  Administered 2019-06-27 – 2019-06-30 (×4): 40 [IU] via SUBCUTANEOUS
  Filled 2019-06-27 (×5): qty 0.4

## 2019-06-27 MED ORDER — SENNOSIDES-DOCUSATE SODIUM 8.6-50 MG PO TABS
1.0000 | ORAL_TABLET | Freq: Every evening | ORAL | Status: DC | PRN
  Administered 2019-07-06 – 2019-07-07 (×2): 1 via ORAL
  Filled 2019-06-27 (×2): qty 1

## 2019-06-27 MED ORDER — ONDANSETRON HCL 4 MG/2ML IJ SOLN
4.0000 mg | Freq: Four times a day (QID) | INTRAMUSCULAR | Status: DC | PRN
  Administered 2019-07-05: 4 mg via INTRAVENOUS
  Filled 2019-06-27: qty 2

## 2019-06-27 MED ORDER — FUROSEMIDE 20 MG PO TABS: 20.0000 mg | ORAL_TABLET | Freq: Every day | ORAL | Status: DC

## 2019-06-27 MED ORDER — APIXABAN 5 MG PO TABS
5.0000 mg | ORAL_TABLET | Freq: Two times a day (BID) | ORAL | Status: DC
  Administered 2019-06-27 – 2019-07-01 (×8): 5 mg via ORAL
  Filled 2019-06-27 (×8): qty 1

## 2019-06-27 MED ORDER — INSULIN ASPART 100 UNIT/ML ~~LOC~~ SOLN
0.0000 [IU] | Freq: Three times a day (TID) | SUBCUTANEOUS | Status: DC
  Administered 2019-06-28: 4 [IU] via SUBCUTANEOUS
  Administered 2019-06-28: 3 [IU] via SUBCUTANEOUS
  Administered 2019-06-28 – 2019-06-29 (×3): 4 [IU] via SUBCUTANEOUS
  Administered 2019-06-29: 12:00:00 7 [IU] via SUBCUTANEOUS
  Administered 2019-06-30 – 2019-07-01 (×6): 11 [IU] via SUBCUTANEOUS
  Administered 2019-07-02: 15 [IU] via SUBCUTANEOUS
  Administered 2019-07-02: 11 [IU] via SUBCUTANEOUS
  Administered 2019-07-02: 20 [IU] via SUBCUTANEOUS
  Administered 2019-07-03: 11 [IU] via SUBCUTANEOUS
  Administered 2019-07-03: 13:00:00 15 [IU] via SUBCUTANEOUS
  Administered 2019-07-03: 11 [IU] via SUBCUTANEOUS
  Administered 2019-07-04 (×2): 7 [IU] via SUBCUTANEOUS
  Administered 2019-07-04: 15 [IU] via SUBCUTANEOUS
  Administered 2019-07-05: 4 [IU] via SUBCUTANEOUS
  Administered 2019-07-05: 7 [IU] via SUBCUTANEOUS
  Administered 2019-07-05: 20 [IU] via SUBCUTANEOUS
  Administered 2019-07-06 (×2): 4 [IU] via SUBCUTANEOUS
  Administered 2019-07-07: 7 [IU] via SUBCUTANEOUS
  Administered 2019-07-08 – 2019-07-09 (×4): 4 [IU] via SUBCUTANEOUS
  Administered 2019-07-10: 3 [IU] via SUBCUTANEOUS

## 2019-06-27 MED ORDER — ATORVASTATIN CALCIUM 10 MG PO TABS
20.0000 mg | ORAL_TABLET | Freq: Every day | ORAL | Status: DC
  Administered 2019-06-27 – 2019-07-18 (×22): 20 mg via ORAL
  Filled 2019-06-27 (×23): qty 2

## 2019-06-27 MED ORDER — ALBUTEROL SULFATE HFA 108 (90 BASE) MCG/ACT IN AERS
2.0000 | INHALATION_SPRAY | Freq: Four times a day (QID) | RESPIRATORY_TRACT | Status: DC | PRN
  Administered 2019-06-29 – 2019-07-17 (×9): 2 via RESPIRATORY_TRACT
  Filled 2019-06-27: qty 6.7

## 2019-06-27 MED ORDER — POLYVINYL ALCOHOL 1.4 % OP SOLN: 1.0000 [drp] | Freq: Three times a day (TID) | OPHTHALMIC | Status: DC | PRN

## 2019-06-27 MED ORDER — SODIUM CHLORIDE 0.9 % IV SOLN
100.0000 mg | Freq: Every day | INTRAVENOUS | Status: AC
  Administered 2019-06-28 – 2019-07-01 (×4): 100 mg via INTRAVENOUS
  Filled 2019-06-27 (×5): qty 20

## 2019-06-27 MED ORDER — SODIUM CHLORIDE 0.9 % IV BOLUS
500.0000 mL | Freq: Once | INTRAVENOUS | Status: AC
  Administered 2019-06-27: 500 mL via INTRAVENOUS

## 2019-06-27 MED ORDER — METOPROLOL SUCCINATE ER 25 MG PO TB24
25.0000 mg | ORAL_TABLET | Freq: Every day | ORAL | Status: DC
  Administered 2019-06-27 – 2019-06-28 (×2): 25 mg via ORAL
  Filled 2019-06-27 (×2): qty 1

## 2019-06-27 MED ORDER — ONDANSETRON HCL 4 MG PO TABS
4.0000 mg | ORAL_TABLET | Freq: Four times a day (QID) | ORAL | Status: DC | PRN
  Administered 2019-07-13: 4 mg via ORAL
  Filled 2019-06-27 (×2): qty 1

## 2019-06-27 MED ORDER — INSULIN ASPART 100 UNIT/ML ~~LOC~~ SOLN
0.0000 [IU] | Freq: Every day | SUBCUTANEOUS | Status: DC
  Administered 2019-06-27: 2 [IU] via SUBCUTANEOUS
  Administered 2019-06-30: 3 [IU] via SUBCUTANEOUS
  Administered 2019-07-01 – 2019-07-02 (×2): 4 [IU] via SUBCUTANEOUS
  Administered 2019-07-03 – 2019-07-04 (×2): 3 [IU] via SUBCUTANEOUS
  Administered 2019-07-05: 5 [IU] via SUBCUTANEOUS
  Administered 2019-07-06: 3 [IU] via SUBCUTANEOUS
  Administered 2019-07-07: 2 [IU] via SUBCUTANEOUS

## 2019-06-27 MED ORDER — SODIUM CHLORIDE 0.9 % IV SOLN: INTRAVENOUS | Status: AC

## 2019-06-27 MED ORDER — SODIUM CHLORIDE 0.9 % IV SOLN
200.0000 mg | Freq: Once | INTRAVENOUS | Status: AC
  Administered 2019-06-27: 200 mg via INTRAVENOUS
  Filled 2019-06-27: qty 40

## 2019-06-27 MED ORDER — ENALAPRIL MALEATE 20 MG PO TABS
40.0000 mg | ORAL_TABLET | Freq: Every day | ORAL | Status: DC
  Filled 2019-06-27: qty 2

## 2019-06-27 NOTE — Progress Notes (Addendum)
Pharmacy Antibiotic Note  [name] is a 68 y.o. male admitted on 06/16/2019 with symptomatic COVID-19 multifocal pneumonia with hypoxia requiring O2 support. Comorbidities include obesity, DM, HTN. Pharmacy has been consulted for remdesivir dosing.  WBC 4.8, Tmax 100.38F; AST 111, ALT 47  Plan: Remdesivir 200 mg IV X 1, followed by remdesivir 100 mg IV Q 24 hrs X 4 doses Monitor temp, clinical improvement, daily LFTs, inflammatory markers   Temp (24hrs), Avg:100.3 F (37.9 C), Min:99.9 F (37.7 C), Max:100.7 F (38.2 C)  Recent Labs  Lab 06/15/2019 1428  WBC 4.8  CREATININE 3.06*  LATICACIDVEN 1.7    Estimated Creatinine Clearance: 41.6 mL/min (A) (by C-G formula based on SCr of 3.06 mg/dL (H)).    Allergies  Allergen Reactions  . Vancomycin     REACTION: ARF    Microbiology results: 12/15 COVID: positive 12/15 Influenza A, B: negative  Thank you for allowing pharmacy to be a part of this patient's care.  Gillermina Hu, PharmD, BCPS, [facility] Clinical Pharmacist 07/08/2019 8:42 PM

## 2019-06-27 NOTE — Progress Notes (Signed)
   CC: Dyspnea, anorexia, fatigue, dysgeusia  HPI:  [name] is a 68 y.o. very pleasant African-American gentleman with medical history significant for hypertension, diabetes mellitus, morbid obesity, OSA, CKD stage III who is presenting to the clinic today for evaluation of dyspnea.  Please see problem based charting for further details.   Past Medical History:  Diagnosis Date  . Arthritis    "elbows & knees" (12/13/2014)  . Asthma   . CKD (chronic kidney disease), stage III   . DIABETIC FOOT ULCER 06/20/2009  . Edema, macular, due to secondary diabetes ([place])   . Erectile dysfunction   . GERD (gastroesophageal reflux disease)   . HEARING LOSS, SENSORINEURAL, BILATERAL 01/03/2007   Seen by ENT Dr. Orpah Greek D. Redmond Baseman 01/03/07  . Hemorrhoids   . History of echocardiogram    a. 04/2008 Echo: EF 50-55%, abnl LV relaxation, mildly dil LA.  Marland Kitchen Hyperlipidemia   . Hypertension   . Morbid obesity ([place])   . Neuropathy, lower extremity   . OSA (obstructive sleep apnea)    uses CPAP nightly  . OSA on CPAP    Nocturnal polysomnogram on 01/21/2010 showed severe obstructive sleep apnea/hypopnea syndrome, AHI 74.1 per hour with non positional events, moderately loud snoring, and oxygen desaturation to a nadir of 78% on room air.  CPAP was successfully titrated to 17 CWP, AHI 1.1 per hour using a large ResMed Mirage Quattro full-face mask with heated humidifier. Bruxism was noted.   . Osteomyelitis of ankle and foot ([place])   . Retinopathy   . Type II diabetes mellitus ([place])    w/complication NOS, type II   Review of Systems:  As per HPI  Physical Exam:  Vitals:   06/26/2019 1316  BP: (!) 158/82  Pulse: 95  Temp: 99.9 F (37.7 C)  TempSrc: Oral  SpO2: (!) 85%  Weight: (!) 409 lb 11.2 oz (185.8 kg)  Height: 6\' 6"  (1.981 m)   Physical Exam  Constitutional: He is oriented to person, place, and time and well-developed, well-nourished, and in no distress. No distress.  HENT:  Head:  Normocephalic and atraumatic.  Eyes: Left eye exhibits no discharge. No scleral icterus.  Cardiovascular: Normal rate, regular rhythm and normal heart sounds.  Pulmonary/Chest: Effort normal and breath sounds normal. No respiratory distress. He has no wheezes. He has no rales.  Abdominal: Bowel sounds are normal. He exhibits no distension. There is no abdominal tenderness.  Musculoskeletal:        General: No edema. 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     Cervical back: Neck supple.  Neurological: He is oriented to person, place, and time.  Skin: Skin is dry. He is not diaphoretic.  Psychiatric: Mood and affect normal.    Assessment & Plan:   See Encounters Tab for problem based charting.  Patient discussed with Dr. Philipp Ovens

## 2019-06-27 NOTE — H&P (Signed)
Date: 07/04/2019               Patient Name:  BROWN DUNLAP MRN: [id number]  DOB: 1951/06/17 Age / Sex: 68 y.o., male   PCP: Aldine Contes, MD         Medical Service: Internal Medicine Teaching Service         Attending Physician: Dr. Bartholomew Crews, MD    First Contact: Dr. Benjamine Mola Pager: [phone]  Second Contact: Dr. Myrtie Hawk Pager: [phone]       After Hours (After 5p/  First Contact Pager: [phone]  weekends / holidays): Second Contact Pager: [phone]   Chief Complaint: [facility]  History of Present Illness: Quantarius Genrich is a 68 y.o male with diabetes mellitus, hypertension, hyperlipidemia, morbid obesity, OSA on CPAP therapy, and CKD stage II who presented to the internal medicine clinic with one week of progressive dyspnea and fatigue. History was obtained by the patient and through chart review.  Patient states that he went to his nephrologist appointment on Tuesday last week. After the appointment he noticed significant fatigue and shortness of breath. This progressed to significant exertional dyspnea that has affected his ability to perform his ADLs. He states that over the weekend he was so weak and had such bad shortness of breath that he passed out while trying to go to the bathroom. He did not hit his head or seek medical attention. Over the course of the last week he has noticed overall worsening of his symptoms as mentioned above and including decreased oral intake due to lack of taste, chills, diarrhea, and a nonproductive cough. He denies headaches, sinus congestion, chest pain, fevers, nausea/vomiting, myalgias, and arthralgias. He is not tried any over-the-counter medications for his symptoms. He currently lives with his wife and states that she has not been sick. He denies other sick contacts.  While in the internal medicine clinic his oxygen sets were noted to be 85% and improved to the mid 90s with 2 L per minute nasal cannula.  Meds:  No current  facility-administered medications on file prior to encounter.   Current Outpatient Medications on File Prior to Encounter  Medication Sig Dispense Refill  . ACCU-CHEK FASTCLIX LANCETS MISC Check blood sugar 5 times a day 408 each 3  . albuterol (PROAIR HFA) 108 (90 Base) MCG/ACT inhaler Inhale 2 puffs into the lungs every 6 (six) hours as needed for wheezing or shortness of breath. 24.7 g 3  . apixaban (ELIQUIS) 5 MG TABS tablet Take 1 tablet (5 mg total) by mouth 2 (two) times daily. 180 tablet 1  . atorvastatin (LIPITOR) 20 MG tablet TAKE 1 TABLET(20 MG) BY MOUTH DAILY 90 tablet 1  . Blood Glucose Monitoring Suppl (ACCU-CHEK AVIVA PLUS) w/Device KIT CHECK BLOOD SUGAR 5 TIMES A DAY 1 kit 0  . Cholecalciferol (VITAMIN D3) 50 MCG (2000 UT) TABS Take 2,000 Units by mouth daily.    . Continuous Blood Gluc Sensor (FREESTYLE LIBRE 14 DAY SENSOR) MISC 1 each by Does not apply route 6 (six) times daily. 2 each 12  . enalapril (VASOTEC) 20 MG tablet TAKE 2 TABLETS(40 MG) BY MOUTH DAILY 180 tablet 1  . furosemide (LASIX) 20 MG tablet Take 1 tablet (20 mg total) by mouth daily. 90 tablet 1  . gabapentin (NEURONTIN) 300 MG capsule TAKE 1 CAPSULE(300 MG) BY MOUTH AT BEDTIME 90 capsule 1  . glucose blood (ACCU-CHEK AVIVA PLUS) test strip USE TO CHECK BLOOD FIVE TIMES DAILY 450 each  1  . glucose blood (FREESTYLE PRECISION NEO TEST) test strip 1 each by Other route as needed for other. Use as instructed to check blood sugar 2 times daily. 100 each 3  . HUMULIN R 500 UNIT/ML injection DRAW INSULIN TO THE 68 UNIT LINE ON THE U-100 SYRINGE(TO USE 340 UNITS) PER DAY AS DIRECTED 20 mL 0  . HYDROcodone-acetaminophen (NORCO) 7.5-325 MG tablet Take 1 tablet by mouth every 8 (eight) hours as needed for moderate pain or severe pain. 15 tablet 0  . hydroxypropyl methylcellulose / hypromellose (ISOPTO TEARS / GONIOVISC) 2.5 % ophthalmic solution Place 1-2 drops into both eyes 3 (three) times daily as needed for dry eyes  (dry/irritated eyes.).    . Insulin Disposable Pump (V-GO 20) KIT USE DAILY WITH INSULIN 30 kit 2  . INSULIN SYRINGE 1CC/29G 29G X 1/2" 1 ML MISC USE DAILY AS DIRECTED 100 each 0  . metoprolol succinate (TOPROL XL) 25 MG 24 hr tablet Take 1 tablet (25 mg total) by mouth daily. 30 tablet 11  . PAZEO 0.7 % SOLN INSTILL 1 DROP IN AFFECTED EYE(S) DAILY 2.5 mL 3  . promethazine (PHENERGAN) 25 MG tablet TAKE 1 TABLET(25 MG) BY MOUTH EVERY 8 HOURS AS NEEDED FOR NAUSEA OR VOMITING 90 tablet 0  . RESTASIS 0.05 % ophthalmic emulsion Place 1 drop into both eyes daily.    Marland Kitchen SIMBRINZA 1-0.2 % SUSP Place 1 drop into both eyes 2 (two) times daily.     Marland Kitchen VICTOZA 18 MG/3ML SOPN ADMINISTER 1.8 MG UNDER THE SKIN AT THE SAME TIME EVERY DAY 9 mL 0   Allergies: Allergies as of 06/25/2019 - Review Complete 07/10/2019  Allergen Reaction Noted  . Vancomycin  05/21/2006   Past Medical History:  Diagnosis Date  . Arthritis    "elbows & knees" (12/13/2014)  . Asthma   . CKD (chronic kidney disease), stage III   . DIABETIC FOOT ULCER 06/20/2009  . Edema, macular, due to secondary diabetes ([place])   . Erectile dysfunction   . GERD (gastroesophageal reflux disease)   . HEARING LOSS, SENSORINEURAL, BILATERAL 01/03/2007   Seen by ENT Dr. Orpah Greek D. Redmond Baseman 01/03/07  . Hemorrhoids   . History of echocardiogram    a. 04/2008 Echo: EF 50-55%, abnl LV relaxation, mildly dil LA.  Marland Kitchen Hyperlipidemia   . Hypertension   . Morbid obesity ([place])   . Neuropathy, lower extremity   . OSA (obstructive sleep apnea)    uses CPAP nightly  . OSA on CPAP    Nocturnal polysomnogram on 01/21/2010 showed severe obstructive sleep apnea/hypopnea syndrome, AHI 74.1 per hour with non positional events, moderately loud snoring, and oxygen desaturation to a nadir of 78% on room air.  CPAP was successfully titrated to 17 CWP, AHI 1.1 per hour using a large ResMed Mirage Quattro full-face mask with heated humidifier. Bruxism was noted.   . Osteomyelitis  of ankle and foot ([place])   . Retinopathy   . Type II diabetes mellitus ([facility])    w/complication NOS, type II   Family History: His son and daughter both died of complications of diabetes. He otherwise denies a family history of hypertension, CAD, kidney disease.  Social History: The patient previously worked in [employer] but is now retired. 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 He is married. He had three children, but only one is currently alive. His other two children died from complications of diabetes. He denies the use of tobacco, alcohol, or illicit substances.  Review  of Systems: A complete ROS was negative except as per HPI.   Physical Exam: Vitals:   06/15/2019 1316  BP: (!) 158/82  Pulse: 95  Temp: 99.9 F (37.7 C)  TempSrc: Oral  SpO2: (!) 85%  Weight: (!) 409 lb 11.2 oz (185.8 kg)  Height: '6\' 6"'$  (1.981 m)   General: Morbidly obese male, dyspneic with conversation HENT: Normocephalic, atraumatic, moist mucus membranes Pulm: Good air movement with no wheezing or crackles  CV: RRR, no murmurs, no rubs  Abdomen: Active bowel sounds, soft, non-distended, no tenderness to palpation  Extremities: Pulses palpable in all extremities, no LE edema  Skin: Warm and dry  Neuro: Alert and oriented x 3  Assessment & Plan by Problem:  Nayshawn Mesta is a 68 y.o male with diabetes mellitus, hypertension, hyperlipidemia, morbid obesity, OSA on CPAP therapy, and CKD stage II who presented to the internal medicine clinic with one week of progressive dyspnea and fatigue. Upon further evaluation he was found to be in acute hypoxic respiratory failure. His symptoms are consistent with COVID-19 and testing was subsequently sent off. He will be admitted for further evaluation/management.  Acute hypoxic respiratory failure - Currently saturating in the mid-90s on 2 L per minute nasal cannula - Symptoms are consistent with COVID-19 and testing has been sent off - If COVID-19 testing  returned positive will start dexamethasone 6 mg daily for 10 days - If COVID-19 testing returns positive he will also need inflammatory markers and LFTs drawn. Given his hypoxia he would benefit from Remdesivir as well  - If COVID-19 testing returns negative we will repeat testing and check for other viral illnesses - Obtain chest x-ray and EKG - Does not appear volume overload but will add on BNP  Diabetes mellitus - Last hemoglobin A1c 8.2 - Outpatient medications include Humulin 68 units QD and Victoza 1.8 mg QD  - Transition to Lantus 40 units QHS and SSI while in the hospital   Hypertension - Depending on the patient's renal function we may continue his enalapril 40 mg once daily and furosemide 20 mg daily  Hyperlipidemia - Continue atorvastatin 20 mg once daily  OSA on CPAP therapy - CPAP QHS  CKD stage II - Check BMP  - Limit nephrotoxic medication   PAF - Continue metoprolol succinate 25 mg once daily - Continue Eliquis 5 mg twice daily  Diet: Carb modified  VTE ppx: Eliquis CODE STATUS: Full Code  Dispo: Admit patient to Inpatient with expected length of stay greater than 2 midnights.  SignedIna Homes, MD 06/29/2019, 3:45 PM  Pager: [phone]

## 2019-06-27 NOTE — Assessment & Plan Note (Signed)
Presyncope: [name] tells me today that since his symptoms started, he has been experiencing decreased oral intake and at one point he felt dizziness and lightheadedness with upstanding and subsequently had a ground-level fall.  He denies any preceding cardiac symptoms.  Plan: -Follow-up orthostatic vitals -Start IVF resuscitation with NS

## 2019-06-27 NOTE — Addendum Note (Signed)
Addended by: Ebbie Latus on: 06/30/2019 05:43 PM   Modules accepted: Orders

## 2019-06-27 NOTE — Addendum Note (Signed)
Addended by: Ina Homes T on: 07/01/2019 02:47 PM   Modules accepted: Orders, SmartSet

## 2019-06-27 NOTE — Assessment & Plan Note (Signed)
Acute hypoxic respiratory failure: [name] states that he was in his usual state of health until 1 week ago when he began experiencing weakness after visiting his nephrologist.  His weakness has persisted and has also had decreased oral intake, chills and progressive dyspnea.  He has also had a nonproductive cough as well as dysgeusia.  He denies fevers, chest pain, nausea, vomiting, diaphoresis.  He lives at home with his wife and he states that since his symptoms started a week ago, he has been really difficult for him to get around.  While he was being assessed by our clinic nursing staff today, his oxygen saturation had decreased to about 85% however he improved to mid 90s with 2 L nasal cannula.  He denies any sick contacts.  Assessment: His acute hypoxic respiratory failure is most likely due to probable underlying coronavirus infection.  We will plan to admit to the hospital is requiring supplemental oxygen.  Other possibilities include pulmonary embolism (very less likely), CHF also very less likely as he does not appear volume overloaded.  Plan: -SARS-CoV-2 rapid test -Chest x-ray -Stat CBC, CMP, D-dimer, lactic acid -Admit to medical telemetry

## 2019-06-27 NOTE — Progress Notes (Addendum)
Internal Medicine Clinic Attending  Case discussed with Dr. Eileen Stanford at the time of the visit.  We reviewed the resident's history and exam and pertinent patient test results.  I agree with the assessment, diagnosis, and plan of care documented in the resident's note.   Patient here with multiple complaints of worsening dyspnea, diarrhea, generalized weakness, decreased oral intake, and dizziness / presyncope. Symptoms started approximately 1 week ago. On arrival he was found to be hypoxic to 85% on RA. He is currently stable on 2-3L . Overall, highly suspicious for COVID-19 vs. Influenza infection. STAT labs & CXR ordered and pending. Will admit for further management.

## 2019-06-28 ENCOUNTER — Encounter (HOSPITAL_COMMUNITY): Payer: Self-pay | Admitting: Internal Medicine

## 2019-06-28 ENCOUNTER — Other Ambulatory Visit: Payer: Medicare Other

## 2019-06-28 DIAGNOSIS — R7401 Elevation of levels of liver transaminase levels: Secondary | ICD-10-CM

## 2019-06-28 DIAGNOSIS — N179 Acute kidney failure, unspecified: Secondary | ICD-10-CM

## 2019-06-28 LAB — COMPREHENSIVE METABOLIC PANEL
ALT: 63 U/L — ABNORMAL HIGH (ref 0–44)
AST: 142 U/L — ABNORMAL HIGH (ref 15–41)
Albumin: 2.3 g/dL — ABNORMAL LOW (ref 3.5–5.0)
Alkaline Phosphatase: 70 U/L (ref 38–126)
Anion gap: 12 (ref 5–15)
BUN: 41 mg/dL — ABNORMAL HIGH (ref 8–23)
CO2: 20 mmol/L — ABNORMAL LOW (ref 22–32)
Calcium: 7.1 mg/dL — ABNORMAL LOW (ref 8.9–10.3)
Chloride: 106 mmol/L (ref 98–111)
Creatinine, Ser: 2.67 mg/dL — ABNORMAL HIGH (ref 0.61–1.24)
GFR calc Af Amer: 27 mL/min — ABNORMAL LOW (ref >60)
GFR calc non Af Amer: 23 mL/min — ABNORMAL LOW (ref >60)
Glucose, Bld: 205 mg/dL — ABNORMAL HIGH (ref 70–99)
Potassium: 4 mmol/L (ref 3.5–5.1)
Sodium: 138 mmol/L (ref 135–145)
Total Bilirubin: 1.1 mg/dL (ref 0.3–1.2)
Total Protein: 6.4 g/dL — ABNORMAL LOW (ref 6.5–8.1)

## 2019-06-28 LAB — D-DIMER, QUANTITATIVE: D-Dimer, Quant: 2.03 ug/mL-FEU — ABNORMAL HIGH (ref 0.00–0.50)

## 2019-06-28 LAB — FERRITIN: Ferritin: 879 ng/mL — ABNORMAL HIGH (ref 24–336)

## 2019-06-28 LAB — CBC WITH DIFFERENTIAL/PLATELET
Abs Immature Granulocytes: 0.04 10*3/uL (ref 0.00–0.07)
Basophils Absolute: 0 10*3/uL (ref 0.0–0.1)
Basophils Relative: 0 %
Eosinophils Absolute: 0 10*3/uL (ref 0.0–0.5)
Eosinophils Relative: 0 %
HCT: 33.2 % — ABNORMAL LOW (ref 39.0–52.0)
Hemoglobin: 10.8 g/dL — ABNORMAL LOW (ref 13.0–17.0)
Immature Granulocytes: 1 %
Lymphocytes Relative: 8 %
Lymphs Abs: 0.4 10*3/uL — ABNORMAL LOW (ref 0.7–4.0)
MCH: 28.6 pg (ref 26.0–34.0)
MCHC: 32.5 g/dL (ref 30.0–36.0)
MCV: 87.8 fL (ref 80.0–100.0)
Monocytes Absolute: 0.1 10*3/uL (ref 0.1–1.0)
Monocytes Relative: 2 %
Neutro Abs: 3.9 10*3/uL (ref 1.7–7.7)
Neutrophils Relative %: 89 %
Platelets: 124 10*3/uL — ABNORMAL LOW (ref 150–400)
RBC: 3.78 MIL/uL — ABNORMAL LOW (ref 4.22–5.81)
RDW: 14.4 % (ref 11.5–15.5)
WBC: 4.4 10*3/uL (ref 4.0–10.5)
nRBC: 0.5 % — ABNORMAL HIGH (ref 0.0–0.2)

## 2019-06-28 LAB — GLUCOSE, CAPILLARY
Glucose-Capillary: 215 mg/dL — ABNORMAL HIGH (ref 70–99)
Glucose-Capillary: 206 mg/dL — ABNORMAL HIGH (ref 70–99)
Glucose-Capillary: 155 mg/dL — ABNORMAL HIGH (ref 70–99)
Glucose-Capillary: 158 mg/dL — ABNORMAL HIGH (ref 70–99)

## 2019-06-28 LAB — MRSA PCR SCREENING: MRSA by PCR: NEGATIVE

## 2019-06-28 LAB — C-REACTIVE PROTEIN: CRP: 21.2 mg/dL — ABNORMAL HIGH (ref <1.0)

## 2019-06-28 MED ORDER — SODIUM CHLORIDE 0.9 % IV SOLN
INTRAVENOUS | Status: DC | PRN
  Administered 2019-06-30: 250 mL via INTRAVENOUS

## 2019-06-28 MED ORDER — HYDROCODONE-ACETAMINOPHEN 7.5-325 MG PO TABS
1.0000 | ORAL_TABLET | Freq: Three times a day (TID) | ORAL | Status: DC | PRN
  Administered 2019-06-28 – 2019-06-30 (×7): 1 via ORAL
  Filled 2019-06-28 (×7): qty 1

## 2019-06-28 MED ORDER — FUROSEMIDE 10 MG/ML IJ SOLN
40.0000 mg | Freq: Once | INTRAMUSCULAR | Status: AC
  Administered 2019-06-28: 40 mg via INTRAVENOUS
  Filled 2019-06-28: qty 4

## 2019-06-28 NOTE — Progress Notes (Signed)
  Date: 06/28/2019  Patient name: [name]  Medical record number: [id number]  Date of birth: Sep 28, 1950    Subjective: Seen this morning on rounds.  Sitting up in bed on 8 L HFNC.  Reports breathing is better than yesterday, still feeling febrile at times, primary complaint is needing to have a bowel movement.  Overall feels tired but better.  Objective:  Vital signs in last 24 hours: Vitals:   06/28/19 0443 06/28/19 0454 06/28/19 0852 06/28/19 1428  BP: 101/86  132/82 (!) 144/75  Pulse: 81 86 75 62  Resp: 20 19  16   Temp:    (!) 97.4 F (36.3 C)  TempSrc:    Oral  SpO2: (!) 89% 91%  97%  Weight: (!) 182.4 kg       Physical Exam: General: Obese man sitting up in bed, appears more comfortable than yesterday, no respiratory distress HEENT: HFNC in place, normal mucous membranes Neck: No JVD evident, but difficult to assess given neck size Heart: Regular rate and rhythm, good pulses and warm extremities, trace edema Lungs: Distant lung sounds, no other significant findings, normal respiratory effort Abdomen: Obese but soft and nontender, active bowel sounds Skin: No significant rashes  Significant new test results: SARS-CoV-2 positive CRP 21, ferritin 879, D-dimer 2, fibrinogen 754, procalcitonin 1.4, LDH 695 BNP 209 Creatinine 3.1, 2.7 Albumin 2.3 AST/ALT 111/47, 142/63  Assessment/Plan:  Principal Problem:   COVID-19 with multiple comorbidities Active Problems:   Diabetes type 2, uncontrolled (HCC)   Morbid obesity (HCC)   Hypertension   Paroxysmal atrial fibrillation ([facility])   Pneumonia due to COVID-98 virus  68 year old man with a history of DM, HTN, [name], morbid obesity, OSA, and CKD stage II admitted with acute hypoxic respiratory failure due to severe COVID-19.  1.  Acute hypoxic respiratory failure due to COVID-19:  Somewhat improved today on HFNC respiratory support.  Also with elevated BNP, trace lower extremity edema, unable to evaluate JVD, will be  cautious with fluids -Continue O2 support as needed -Continue remdesivir and dexamethasone -Already on anticoagulation, no need to consider VTE prophylaxis  2.  Elevated transaminases:  Likely related to COVID-19, no further work-up at this time  3.  AKI on CKD stage II:  Slightly improved from yesterday, does not appear dry on exam, will avoid further fluids and see where he settles out.  Continue avoiding nephrotoxins  4.  T2DM:  Last A1c 8.2%, home medications include Humulin 68 units daily and liraglutide 1.8 mg daily -Continue Lantus 40 units nightly and sliding scale insulin while hospitalized, will continue to reassess insulin needs during acute illness  5.  HTN, [name], paroxysmal atrial fibrillation: -Holding enalapril and furosemide given AKI -Continue metoprolol succinate 25 mg daily -Continue atorvastatin 20 mg daily -Continue apixaban 5 mg twice daily  6.  OSA: -Continue CPAP nightly  FEN: Carb modified Prophylaxis: On apixaban Code Status: Full code, confirmed with patient at bedside today  Dispo: Anticipate possible prolonged course given severe COVID-19 and multiple comorbidities, continues to require inpatient care for acute hypoxic respiratory failure  Lenice Pressman, M.D., Ph.D. 06/28/2019, 4:51 PM

## 2019-06-28 NOTE — Progress Notes (Signed)
Patient is currently COVID+, it is inadvisable to have patient wear CPAP while patient is positive.  Order is PRN, and patient does have OSA, but CPAP is  inadvisable at this time.  Will continue to monitor.

## 2019-06-28 NOTE — Consult Note (Signed)
NAME:  DANZELL BIRKY, MRN:  [id number], DOB:  1950-12-06, LOS: 1 ADMISSION DATE:  07/12/2019, CONSULTATION DATE:  06/28/19 REFERRING MD:  Rebeca Alert  CHIEF COMPLAINT:  Hypoxia   Brief History   MASTER TOUCHET is a 68 y.o. male who was admitted 12/15 with COVID PNA.  PCCM asked to see 12/16 due to increasing O2 requirements.  History of present illness   EMRAN MOLZAHN is a 68 y.o. male who has a PMH including but not limited to DM, HTN, [name], morbid obesity, OSA on CPAP, CKD (see "past medical history" for rest).  He presented to IM clinic 12/15 with progressive dyspnea and fatigue x 1 week.  Symptoms gradually worsened to the point he had to seek medical attention.  At IM clinic, he was found to be hypoxic with SpO2 85% on RA.  He was admitted and was later found to be COVID positive.  He was started on remdesivir and decadron.  He is on chronic apixaban which was continued.  Noteable inflammatory markers from admit:  CRP 21, ferritin 879, D-dimer 2, fibrinogen 754, procalcitonin 1.4, LDH 695.  On 12/16, he had gradual increasing O2 requirements.  Overnight, he was up to 10L HFNC with sats low to mid 90s.  Per discussion with RT, he seemed to have a pattern of desaturation with any exertion (ie getting to the bedside commode, etc); however, after some rest, he would bounce back.  Due to increasing O2 requirements, PCCM was asked to evaluate him.  He does not appear to be in any distress and reports that he is breathing fairly comfortably.  Past Medical History  has Diabetes type 2, uncontrolled ([place]); Obstructive sleep apnea; Diabetic peripheral neuropathy associated with type 2 diabetes mellitus ([place]); DIABETIC MACULAR EDEMA; HEARING LOSS, SENSORINEURAL, BILATERAL; GERD; Chronic kidney disease, stage III (moderate) ([place]); Morbid obesity ([place]); STATUS, OTHER TOE(S) AMPUTATION; Hypertension; Anemia; Vitamin D deficiency; Paroxysmal atrial fibrillation ([place]); Preventative health care;  Chronic pain syndrome; Nausea; Current moderate episode of major depressive disorder ([place]); Dyspnea; Hypoxia; Diarrhea; Respiratory failure with hypoxia ([place]); Postural dizziness with presyncope; COVID-19 with multiple comorbidities; and Pneumonia due to COVID-19 virus on their problem list.  [facility] Events   12/15 > admit.  Consults:  PCCM.  Procedures:  None.  Significant Diagnostic Tests:  CXR 12/15 > extensive b/l airspace disease with vascular congestion.  Micro Data:  Flu 12/15 > neg. SARS CoV 2 12/15 > pos.  Antimicrobials:  None.   Interim history/subjective:  Comfortable on 10 L HFNC with SpO2 mid 90s.  Objective:  Blood pressure 121/65, pulse 61, temperature 98 F (36.7 C), temperature source Oral, resp. rate (!) 24, weight (!) 182.4 kg, SpO2 98 %.        Intake/Output Summary (Last 24 hours) at 06/28/2019 2158 Last data filed at 06/28/2019 2050 Gross per 24 hour  Intake 600 ml  Output 1600 ml  Net -1000 ml   Filed Weights   06/28/19 0443  Weight: (!) 182.4 kg    Examination: General: Obese male, sleeping comfortably, in NAD. Neuro: Easily arouseable.  A&O x 3, no deficits. HEENT: /AT. Sclerae anicteric.  EOMI. Cardiovascular: RRR, no M/R/G.  Lungs: Respirations even and unlabored.  Breath sounds distant but no obvious W/R/R.   Abdomen: Obese, BS x 4, soft, NT/ND.  Musculoskeletal: No gross deformities, no edema.  Skin: Intact, warm, no rashes.  Assessment & Plan:   Acute hypoxic respiratory failure - 2/2 COVID PNA. - Allow permissive hypoxemia; continue  supplemental O2 as needed to maintain SpO2 > 85% at rest and 75% - 80% with exertion as long as he is comfortable / not in distress. - Intubation should be deferred if possible and should be based on whether pt has a mental status change or physical evidence of ventilatory failure (ie. Accessory muscle use, paradoxical pattern, etc). - Continue remdesivir, decadron, apixaban. -  Mobilize as able. - Encourage awake proning as much as able (this would be really beneficial given his body habitus) - Push IS. - Maintain neg I/O balance.   Rest per primary team.  Nothing further to add.  PCCM will sign off.  Please do not hesitate to call us back if we can be of any further assistance.   Best Practice:  Diet: Heart healthy. DVT prophylaxis: Per primary. GI prophylaxis: N/A. Glucose control: Lantus / SSI. Mobility: Up with assist. Code Status: Full. Family Communication: None available. Disposition: Tele.  Labs   CBC: Recent Labs  Lab 06/19/2019 1428 06/28/19 0423  WBC 4.8 4.4  NEUTROABS 3.9 3.9  HGB 11.5* 10.8*  HCT 36.2* 33.2*  MCV 89.6 87.8  PLT 108* 427*   Basic Metabolic Panel: Recent Labs  Lab 07/03/2019 1428 06/28/19 0423  NA 135 138  K 4.4 4.0  CL 100 106  CO2 22 20*  GLUCOSE 265* 205*  BUN 42* 41*  CREATININE 3.06* 2.67*  CALCIUM 7.6* 7.1*   GFR: Estimated Creatinine Clearance: 47.2 mL/min (A) (by C-G formula based on SCr of 2.67 mg/dL (H)). Recent Labs  Lab 06/14/2019 1428 06/26/2019 2030 06/28/19 0423  PROCALCITON  --  1.40  --   WBC 4.8  --  4.4  LATICACIDVEN 1.7  --   --    Liver Function Tests: Recent Labs  Lab 06/20/2019 1428 06/28/19 0423  AST 111* 142*  ALT 47* 63*  ALKPHOS 76 70  BILITOT 1.1 1.1  PROT 6.5 6.4*  ALBUMIN 2.7* 2.3*   No results for input(s): LIPASE, AMYLASE in the last 168 hours. No results for input(s): AMMONIA in the last 168 hours. ABG    Component Value Date/Time   PHART 7.393 01/24/2010 2210   PCO2ART 35.3 01/24/2010 2210   PO2ART 105.0 (H) 01/24/2010 2210   HCO3 21.0 01/24/2010 2210   TCO2 23 02/01/2010 1521   ACIDBASEDEF 3.1 (H) 01/24/2010 2210   O2SAT 98.7 01/24/2010 2210    Coagulation Profile: No results for input(s): INR, PROTIME in the last 168 hours. Cardiac Enzymes: No results for input(s): CKTOTAL, CKMB, CKMBINDEX, TROPONINI in the last 168 hours. HbA1C: Hemoglobin A1C    Date/Time Value Ref Range Status  06/06/2019 10:46 AM 8.2 (A) 4.0 - 5.6 % Final   Hgb A1c MFr Bld  Date/Time Value Ref Range Status  02/28/2019 10:10 AM 9.8 (H) 4.6 - 6.5 % Final    Comment:    Glycemic Control Guidelines for People with Diabetes:Non Diabetic:  <6%Goal of Therapy: <7%Additional Action Suggested:  >8%   11/14/2018 09:54 AM 10.5 (H) 4.6 - 6.5 % Final    Comment:    Glycemic Control Guidelines for People with Diabetes:Non Diabetic:  <6%Goal of Therapy: <7%Additional Action Suggested:  >8%    CBG: Recent Labs  Lab 07/10/2019 1514 07/13/2019 1636 07/08/2019 2057 06/28/19 0810 06/28/19 1318  GLUCAP 263* 215* 206* 155* 158*    Review of Systems:   All negative; except for those that are bolded, which indicate positives.  Constitutional: weight loss, weight gain, night sweats, fevers, chills, fatigue, weakness.  HEENT: headaches, sore throat, sneezing, nasal congestion, post nasal drip, difficulty swallowing, tooth/dental problems, visual complaints, visual changes, ear aches. Neuro: difficulty with speech, weakness, numbness, ataxia. CV:  chest pain, orthopnea, PND, swelling in lower extremities, dizziness, palpitations, syncope.  Resp: cough, hemoptysis, dyspnea (improving), wheezing. GI: heartburn, indigestion, abdominal pain, nausea, vomiting, diarrhea, constipation, change in bowel habits, loss of appetite, hematemesis, melena, hematochezia.  GU: dysuria, change in color of urine, urgency or frequency, flank pain, hematuria. MSK: joint pain or swelling, decreased range of motion. Psych: change in mood or affect, depression, anxiety, suicidal ideations, homicidal ideations. Skin: rash, itching, bruising.   Past medical history  He,  has a past medical history of Arthritis, Asthma, CKD (chronic kidney disease), stage III, DIABETIC FOOT ULCER (06/20/2009), Edema, macular, due to secondary diabetes ([place]), Erectile dysfunction, GERD (gastroesophageal reflux disease),  HEARING LOSS, SENSORINEURAL, BILATERAL (01/03/2007), Hemorrhoids, History of echocardiogram, Hyperlipidemia, Hypertension, Morbid obesity ([place]), Neuropathy, lower extremity, OSA (obstructive sleep apnea), OSA on CPAP, Osteomyelitis of ankle and foot ([place]), Retinopathy, and Type II diabetes mellitus ([place]).   Surgical History    Past Surgical History:  Procedure Laterality Date  . ACHILLES TENDON LENGTHENING Left 03/21/2018   Procedure: ACHILLES TENDON LENGTHENING;  Surgeon: Erle Crocker, MD;  Location: [facility];  Service: Orthopedics;  Laterality: Left;  . AMPUTATION Left 12/15/2014   Procedure: LEFT SECOND TOE AMPUTATION ;  Surgeon: Dorna Leitz, MD;  Location: [facility];  Service: Orthopedics;  Laterality: Left;  . AMPUTATION Left 03/21/2018   Procedure: TRANSMETATARSAL AMPUTATION LEFT FOOT;  Surgeon: Erle Crocker, MD;  Location: [facility];  Service: Orthopedics;  Laterality: Left;  Popliteal & ankle block  . COLONOSCOPY WITH PROPOFOL N/A 01/27/2019   Procedure: COLONOSCOPY WITH PROPOFOL;  Surgeon: Carol Ada, MD;  Location: [facility];  Service: Endoscopy;  Laterality: N/A;  . POLYPECTOMY  01/27/2019   Procedure: POLYPECTOMY;  Surgeon: Carol Ada, MD;  Location: [facility];  Service: Endoscopy;;  . 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 Kathalene Frames. [facility] Camel.  . WOUND DEBRIDEMENT Left 06/15/2016   Procedure: DEBRIDEMENT WOUND LEFT FOOT WITH GRAFT APPLICATION;  Surgeon: Landis Martins, DPM;  Location: [facility];  Service: Podiatry;  Laterality: Left;     Social History   reports that he quit smoking about 9 years ago. His smoking use included cigarettes. He has a 15.00 pack-year smoking history. He has never used smokeless tobacco. He reports current alcohol use of about 11.0 standard drinks of alcohol per week. He reports that he does not use drugs.   Family history   His family history  includes Diabetes in his mother; Heart attack (age of onset: 77) in his father; Throat cancer in his brother.   Allergies Allergies  Allergen Reactions  . Vancomycin     REACTION: ARF     Home meds  Prior to Admission medications   Medication Sig Start Date End Date Taking? Authorizing Provider  albuterol (PROAIR HFA) 108 (90 Base) MCG/ACT inhaler Inhale 2 puffs into the lungs every 6 (six) hours as needed for wheezing or shortness of breath. 08/23/18  Yes Aldine Contes, MD  AMITIZA 24 MCG capsule Take 24 mcg by mouth 2 (two) times daily. 06/13/19  Yes [provider]  apixaban (ELIQUIS) 5 MG TABS tablet Take 1 tablet (5 mg total) by mouth 2 (two) times daily. 03/07/19  Yes Aldine Contes, MD  atorvastatin (LIPITOR) 20 MG  tablet TAKE 1 TABLET(20 MG) BY MOUTH DAILY Patient taking differently: Take 20 mg by mouth daily.  02/10/19  Yes Aldine Contes, MD  Cholecalciferol (VITAMIN D3) 50 MCG (2000 UT) TABS Take 2,000 Units by mouth daily.   Yes [provider]  enalapril (VASOTEC) 20 MG tablet TAKE 2 TABLETS(40 MG) BY MOUTH DAILY Patient taking differently: Take 40 mg by mouth daily.  06/12/19  Yes Oda Kilts, MD  furosemide (LASIX) 20 MG tablet Take 1 tablet (20 mg total) by mouth daily. 04/05/19  Yes Aldine Contes, MD  gabapentin (NEURONTIN) 300 MG capsule TAKE 1 CAPSULE(300 MG) BY MOUTH AT BEDTIME Patient taking differently: Take 300 mg by mouth at bedtime.  06/12/19  Yes Oda Kilts, MD  HUMULIN R 500 UNIT/ML injection DRAW INSULIN TO THE 68 UNIT LINE ON THE U-100 SYRINGE(TO USE 340 UNITS) PER DAY AS DIRECTED Patient taking differently: Inject into the skin See admin instructions. Draw insulin to the 68 units line on the U-100 Syringe(to use 340 units) per day as directed. 06/07/19  Yes Elayne Snare, MD  HYDROcodone-acetaminophen (NORCO) 7.5-325 MG tablet Take 1 tablet by mouth every 8 (eight) hours as needed for moderate pain or severe pain.  03/23/18  Yes Kayleen Memos, DO  hydroxypropyl methylcellulose / hypromellose (ISOPTO TEARS / GONIOVISC) 2.5 % ophthalmic solution Place 1-2 drops into both eyes 3 (three) times daily as needed for dry eyes (dry/irritated eyes.).   Yes [provider]  LUMIGAN 0.01 % SOLN Place 1 drop into both eyes at bedtime. 06/02/19  Yes [provider]  metoprolol succinate (TOPROL XL) 25 MG 24 hr tablet Take 1 tablet (25 mg total) by mouth daily. 03/07/19 03/06/20 Yes Aldine Contes, MD  promethazine (PHENERGAN) 25 MG tablet TAKE 1 TABLET(25 MG) BY MOUTH EVERY 8 HOURS AS NEEDED FOR NAUSEA OR VOMITING Patient taking differently: Take 25 mg by mouth every 8 (eight) hours as needed for nausea or vomiting.  06/12/19  Yes Oda Kilts, MD  RESTASIS 0.05 % ophthalmic emulsion Place 1 drop into both eyes daily. 01/13/19  Yes [provider]  SIMBRINZA 1-0.2 % SUSP Place 1 drop into both eyes 2 (two) times daily.  10/27/12  Yes [provider]  VICTOZA 18 MG/3ML SOPN ADMINISTER 1.8 MG UNDER THE SKIN AT [place] Patient taking differently: Inject 1.8 mg into the skin daily.  05/23/19  Yes Elayne Snare, MD  ACCU-CHEK FASTCLIX LANCETS MISC Check blood sugar 5 times a day 06/10/18   Welford Roche, MD  Blood Glucose Monitoring Suppl (ACCU-CHEK AVIVA PLUS) w/Device KIT CHECK BLOOD SUGAR 5 TIMES A DAY 06/10/18   Santos-Sanchez, Merlene Morse, MD  Continuous Blood Gluc Sensor (FREESTYLE LIBRE 14 DAY SENSOR) MISC 1 each by Does not apply route 6 (six) times daily. 12/19/18   Aldine Contes, MD  glucose blood (ACCU-CHEK AVIVA PLUS) test strip USE TO CHECK BLOOD FIVE TIMES DAILY 06/10/18   Welford Roche, MD  glucose blood (FREESTYLE PRECISION NEO TEST) test strip 1 each by Other route as needed for other. Use as instructed to check blood sugar 2 times daily. 06/08/18   Elayne Snare, MD  Insulin Disposable Pump (V-GO 20) KIT USE DAILY WITH INSULIN 04/10/19   Elayne Snare, MD  INSULIN SYRINGE 1CC/29G 29G X 1/2" 1 ML MISC USE DAILY AS DIRECTED 02/13/19   Elayne Snare, MD     [place] Hora, [facility] 06/28/2019, 10:42 PM

## 2019-06-29 DIAGNOSIS — J189 Pneumonia, unspecified organism: Secondary | ICD-10-CM

## 2019-06-29 DIAGNOSIS — U071 COVID-19: Principal | ICD-10-CM

## 2019-06-29 LAB — COMPREHENSIVE METABOLIC PANEL
ALT: 73 U/L — ABNORMAL HIGH (ref 0–44)
AST: 135 U/L — ABNORMAL HIGH (ref 15–41)
Albumin: 2.3 g/dL — ABNORMAL LOW (ref 3.5–5.0)
Alkaline Phosphatase: 87 U/L (ref 38–126)
Anion gap: 11 (ref 5–15)
BUN: 41 mg/dL — ABNORMAL HIGH (ref 8–23)
CO2: 23 mmol/L (ref 22–32)
Calcium: 7.8 mg/dL — ABNORMAL LOW (ref 8.9–10.3)
Chloride: 105 mmol/L (ref 98–111)
Creatinine, Ser: 2.17 mg/dL — ABNORMAL HIGH (ref 0.61–1.24)
GFR calc Af Amer: 35 mL/min — ABNORMAL LOW (ref >60)
GFR calc non Af Amer: 30 mL/min — ABNORMAL LOW (ref >60)
Glucose, Bld: 185 mg/dL — ABNORMAL HIGH (ref 70–99)
Potassium: 4.2 mmol/L (ref 3.5–5.1)
Sodium: 139 mmol/L (ref 135–145)
Total Bilirubin: 0.8 mg/dL (ref 0.3–1.2)
Total Protein: 6.2 g/dL — ABNORMAL LOW (ref 6.5–8.1)

## 2019-06-29 LAB — GLUCOSE, CAPILLARY
Glucose-Capillary: 132 mg/dL — ABNORMAL HIGH (ref 70–99)
Glucose-Capillary: 174 mg/dL — ABNORMAL HIGH (ref 70–99)
Glucose-Capillary: 177 mg/dL — ABNORMAL HIGH (ref 70–99)
Glucose-Capillary: 224 mg/dL — ABNORMAL HIGH (ref 70–99)

## 2019-06-29 LAB — C-REACTIVE PROTEIN: CRP: 17.6 mg/dL — ABNORMAL HIGH (ref <1.0)

## 2019-06-29 LAB — BLOOD GAS, ARTERIAL
Acid-base deficit: 1.9 mmol/L (ref 0.0–2.0)
Bicarbonate: 22.2 mmol/L (ref 20.0–28.0)
FIO2: 100
O2 Saturation: 92.3 %
Patient temperature: 37
pCO2 arterial: 37.5 mmHg (ref 32.0–48.0)
pH, Arterial: 7.391 (ref 7.350–7.450)
pO2, Arterial: 69.6 mmHg — ABNORMAL LOW (ref 83.0–108.0)

## 2019-06-29 LAB — FERRITIN: Ferritin: 913 ng/mL — ABNORMAL HIGH (ref 24–336)

## 2019-06-29 LAB — D-DIMER, QUANTITATIVE: D-Dimer, Quant: 6.94 ug/mL-FEU — ABNORMAL HIGH (ref 0.00–0.50)

## 2019-06-29 MED ORDER — FUROSEMIDE 10 MG/ML IJ SOLN
40.0000 mg | Freq: Once | INTRAMUSCULAR | Status: AC
  Administered 2019-06-29: 40 mg via INTRAVENOUS
  Filled 2019-06-29: qty 4

## 2019-06-29 MED ORDER — METOPROLOL TARTRATE 25 MG PO TABS
12.5000 mg | ORAL_TABLET | Freq: Four times a day (QID) | ORAL | Status: DC
  Administered 2019-06-29 – 2019-07-08 (×36): 12.5 mg via ORAL
  Filled 2019-06-29 (×36): qty 1

## 2019-06-29 MED ORDER — METOPROLOL TARTRATE 5 MG/5ML IV SOLN
5.0000 mg | INTRAVENOUS | Status: AC | PRN
  Administered 2019-06-29 (×3): 5 mg via INTRAVENOUS
  Filled 2019-06-29 (×3): qty 5

## 2019-06-29 MED ORDER — ONDANSETRON HCL 4 MG/2ML IJ SOLN: 4.0000 mg | Freq: Four times a day (QID) | INTRAMUSCULAR | Status: DC | PRN

## 2019-06-29 MED ORDER — ORAL CARE MOUTH RINSE
15.0000 mL | Freq: Two times a day (BID) | OROMUCOSAL | Status: DC
  Administered 2019-06-29 – 2019-07-19 (×40): 15 mL via OROMUCOSAL

## 2019-06-29 MED ORDER — SODIUM CHLORIDE 0.9% FLUSH
3.0000 mL | Freq: Two times a day (BID) | INTRAVENOUS | Status: DC
  Administered 2019-06-28 – 2019-07-21 (×44): 3 mL via INTRAVENOUS

## 2019-06-29 MED ORDER — ACETAMINOPHEN 325 MG PO TABS
650.0000 mg | ORAL_TABLET | Freq: Four times a day (QID) | ORAL | Status: DC | PRN
  Administered 2019-06-29 – 2019-06-30 (×2): 650 mg via ORAL
  Filled 2019-06-29 (×2): qty 2

## 2019-06-29 MED ORDER — ENOXAPARIN SODIUM 40 MG/0.4ML ~~LOC~~ SOLN: 40.0000 mg | SUBCUTANEOUS | Status: DC

## 2019-06-29 MED ORDER — ACETAMINOPHEN 650 MG RE SUPP: 650.0000 mg | Freq: Four times a day (QID) | RECTAL | Status: DC | PRN

## 2019-06-29 MED ORDER — ONDANSETRON HCL 4 MG PO TABS: 4.0000 mg | ORAL_TABLET | Freq: Four times a day (QID) | ORAL | Status: DC | PRN

## 2019-06-29 MED ORDER — METOPROLOL SUCCINATE ER 25 MG PO TB24
25.0000 mg | ORAL_TABLET | Freq: Every day | ORAL | Status: DC
  Administered 2019-06-29: 25 mg via ORAL
  Filled 2019-06-29: qty 1

## 2019-06-29 NOTE — Progress Notes (Signed)
Report given to Programme researcher, broadcasting/film/video at [facility].

## 2019-06-29 NOTE — Progress Notes (Signed)
Update: discussed with Dr. Lake Bells, plan to transfer to [facility], called bed placement and put in transfer order.

## 2019-06-29 NOTE — Progress Notes (Signed)
   06/29/19 0530  MEWS Score  Resp (!) 22  ECG Heart Rate (!) 142  Pulse Rate 97  SpO2 (!) 79 %  O2 Device Non-rebreather Mask  O2 Flow Rate (L/min) 15 L/min  MEWS Score  MEWS RR 1  MEWS Pulse 3  MEWS Systolic 0  MEWS LOC 0  MEWS Temp 0  MEWS Score 4  MEWS Score Color Red  MEWS Assessment  Is this an acute change? Yes  MEWS guidelines implemented *See [place]  Provider Notification  Provider Name/Title Rebeca Alert, MD  Date Provider Notified 06/29/19  Time Provider Notified [phone]  Notification Type Call  Notification Reason Change in status  Response See new orders  Date of Provider Response 06/29/19  Time of Provider Response 0531   Patient SpO2 dropped to as low as 70s on O2 11L HFNC while in bed. Heart rhythm converted to A-flutter per tele. HR 110s-140s. Patient drowsy, but easily arousable. Ronalee Belts Charge RN aware. Shanon Brow, Rapid Response RN aware and in room. Rebeca Alert, attending MD paged for verification of treatment. Metoprolol IV ordered. Patient being placed on heated HFNC. Patient upgraded to Progressive level of care. Will continue to monitor.

## 2019-06-29 NOTE — Progress Notes (Signed)
PCCM Interval Note   Called to bedside to evaluate for possible tx to ICU.  RN reports issues with Afib with RVR with some desaturation into the lower 80's since improved after additional lopressor IV 5 mg dose.  Remains on heated HFNC at 40L and 1.0.  Patient reported increased SOB and some chest pain rate associated, since resolved.  C/o of no significant SOB and no chest pain.  C/o of severe myalgia's.  Patient states he has slept poorly and wants his CPAP however unable to provide that support on his current floor.  Patient is pending tx to [facility] where that option is available.   On exam, patient alert, oriented, speaking without distress, no respiratory distress- breathing in the low 20's.  BP remains stable.  Remains in Afib with rates < 120.  Patient is a mouth breather at times which is causing intermittent desaturation further aggrivating his Afib.  Patient unable to tolerate higher flow rate above 40 due to discomfort and nasal burning.  Patient would benefit from CPAP at night, possibly BiPAP if needed.  Will continue his scheduled toprol xr with prn as needed for rate control.   At this time, no need to transfer to ICU at this time.  Staff about to try and switch to NRB +/- salter HFNC to facilitate transfer to [place].  If unable to tolerate, may need to transfer to ICU here for BiPAP.   Will continue to follow.      Kennieth Rad, MSN, AGACNP-BC [facility] 06/29/2019, 11:14 PM

## 2019-06-29 NOTE — Progress Notes (Signed)
   06/28/19 2020  MEWS Score  Resp (!) 31  ECG Heart Rate 91  Pulse Rate 88  SpO2 (!) 80 %  O2 Device HFNC  Patient Activity (if Appropriate) Other (Comment) (Patient getting OOB to [facility].)  O2 Flow Rate (L/min) 8 L/min  MEWS Score  MEWS RR 2  MEWS Pulse 0  MEWS Systolic 0  MEWS LOC 0  MEWS Temp 0  MEWS Score 2  MEWS Score Color Yellow  MEWS Assessment  Is this an acute change? No  Provider Notification  Provider Name/Title Trilby Drummer, MD  Date Provider Notified 06/28/19  Time Provider Notified 2058  Notification Type Call  Notification Reason Other (Comment) (Patient Desat to 70s-80s when getting OOB. )  Response See new orders  Date of Provider Response 06/28/19  Time of Provider Response 2058   Ronalee Belts, Charge RN increased O2 HFNC to 14L. SpO2 maintained 89-91% on 14L while patient on BSC. Respirations labored. Will continue to monitor.

## 2019-06-29 NOTE — Significant Event (Signed)
Rapid Response Event Note  Overview: Desaturation and increased oxygen demand   Initial Focused Assessment: Called for acute hypoxia with sats 70s on NRB. Upon arrival, [name] was not in distress sitting up in bed but did endorse some mild SOB. Tachypneic with no accessory muscle use. Left side and back pain. BBS Coarse Rhonchi and diminished. Skin warm, pink and dry. [name] states he does use CPAP. On NRB at 15L his sats only improved to 84%. Pt placed on HHFNC at 40 L flow and 100% FIo2. His sats increased to 94-95%. Will titrate down as tolerated.   Interventions: -HHFNC  Plan of Care (if not transferred): -Follow PCCM recommendations for acceptable ranges -Notify primary svc and/or RRRN if further assistance needed.   Event Summary: Call received 0530 Arrived 0545 Call ended 0615  Madelynn Done

## 2019-06-29 NOTE — Progress Notes (Signed)
At change of shift, John RN called Rapid response to assess patient. Patient HR 140 AFIB, spo2 83-88% on 40L HFNC at 100%. Patient c/o of tightness to chest, sob and chronic pain and pain from fall previous to [place]. Patient given 5mg  metoprolol PRN, HR decreased to 110-120. Patient 02 saturation mainitaing at 85%, respirations 26. Patient alert and oriented, able to talk, but using accessory muscles for breathing. Patient states he feels worse than he did yesterday. Dr. Sherry Ruffing paged and notified.

## 2019-06-29 NOTE — Progress Notes (Addendum)
Date: 06/29/2019  Patient name: [name]  Medical record number: [id number]  Date of birth: 1950-11-22    Subjective: Had a difficult night with worsening hypoxemia, increased up to 40L HFNC. Diuresed with IV furosemide, UOP 1750 yesterday, net -781 cc. Tried proning but was very uncomfortable. Evaluated by pulm, recommend permissive hypoxia with goal sat >85%, avoiding intubation unless increasing respiratory distress.   Feels a little better this morning, just having pain in his buttocks where he says he fell before admission.  Also had atrial fibrillation last night with RVR, received 2 doses IV metoprolol, spontaneously converted to sinus during my rounds this morning, HR in the 50s.  Objective:  Vital signs in last 24 hours: Vitals:   06/29/19 0702 06/29/19 0732 06/29/19 1013 06/29/19 1142  BP: 140/84 (!) 123/99 133/73   Pulse: 70 88 61   Resp:      Temp:    98 F (36.7 C)  TempSrc:    Oral  SpO2: 100% 100%    Weight:      Height:        Physical Exam: General: Obese man sitting up in bed, no respiratory distress HEENT: HFNC in place, normal mucous membranes Neck: No JVD evident, but difficult to assess given neck size Heart: Regular rate and rhythm, good pulses and warm extremities, trace edema Lungs: Distant lung sounds, no other significant findings, normal respiratory effort Abdomen: Obese but soft and nontender, active bowel sounds Skin: No significant rashes  Significant new test results: ABG 7.39/38/70/22 Creatinine 2.2 (from 2.7)  Ferritin 719--> 879--> 913 CRP 22.2--> 21.2--> 17.6 D-dimer 1.6--> 2.0--> 6.9 Abs lymphs 0.7--> 0.4--> 0.1  Assessment/Plan:  Principal Problem:   COVID-19 with multiple comorbidities Active Problems:   Diabetes type 2, uncontrolled (HCC)   Morbid obesity (HCC)   Hypertension   Paroxysmal atrial fibrillation (HCC)   Respiratory failure with hypoxia (HCC)   Pneumonia due to COVID-44 virus  68 year old man with a  history of DM, HTN, [name], morbid obesity, OSA, and CKD stage II admitted with acute hypoxic respiratory failure due to severe COVID-19.  1.  Acute hypoxic respiratory failure due to COVID-19:  Requiring escalating HFNC respiratory support. Appreciate pulm consult, continue current care with permissive hypoxemia. Some improvement with diuresis yesterday, will continue -Continue O2 support as needed -Continue remdesivir and dexamethasone -Already on anticoagulation, no need to consider VTE prophylaxis -IV furosemide 40 mg today -goal O2 sat >85%  2.  Elevated transaminases:  Likely related to COVID-19, no further work-up at this time  3.  AKI on CKD stage II:  Slightly improved from yesterday, does not appear dry on exam, will continue diuresis  4.  T2DM:  Last A1c 8.2%, home medications include Humulin 68 units daily and liraglutide 1.8 mg daily -Continue Lantus 40 units nightly and sliding scale insulin while hospitalized, will continue to reassess insulin needs during acute illness  5.  HTN, [name], paroxysmal atrial fibrillation: -Holding enalapril given AKI -Continue metoprolol succinate 25 mg daily, can give additional doses of IV metoprolol 5 mg prn if Afib with RVR again -Continue atorvastatin 20 mg daily -Continue apixaban 5 mg twice daily  6.  OSA: -Holding CPAP in setting of COVID-19  FEN: Carb modified Prophylaxis: On apixaban Code Status: Full code, confirmed with patient at bedside on admission  Dispo: Anticipate possible prolonged course given severe COVID-19 and multiple comorbidities, continues to require inpatient care for acute hypoxic respiratory failure  Lenice Pressman, M.D., Ph.D. 06/29/2019, 1:33 PM

## 2019-06-29 NOTE — Progress Notes (Signed)
   06/28/19 2230  MEWS Score  Resp (!) 21  ECG Heart Rate 65  Pulse Rate (!) 59  SpO2 90 %  O2 Device HFNC  O2 Flow Rate (L/min) 10 L/min  MEWS Score  MEWS RR 1  MEWS Pulse 0  MEWS Systolic 0  MEWS LOC 0  MEWS Temp 0  MEWS Score 1  MEWS Score Color Green   Pulmonology MD saw patient. MD states patient is stable as long as SpO2 is maintained above 85% with no accessory muscle usage and nasal flaring. Will continue to monitor.

## 2019-06-29 NOTE — Progress Notes (Signed)
NAME:  [name], MRN:  [id number], DOB:  06-Jun-1951, LOS: 2 ADMISSION DATE:  07/02/2019, CONSULTATION DATE:  06/28/19 REFERRING MD:  Rebeca Alert  CHIEF COMPLAINT:  Hypoxia   Brief History   [name] is a 68 y.o. male who was admitted 12/15 with COVID PNA.  PCCM asked to see 12/16 due to increasing O2 requirements.  Past Medical History  has Diabetes type 2, uncontrolled ([place]); Obstructive sleep apnea; Diabetic peripheral neuropathy associated with type 2 diabetes mellitus ([place]); DIABETIC MACULAR EDEMA; HEARING LOSS, SENSORINEURAL, BILATERAL; GERD; Chronic kidney disease, stage III (moderate) ([place]); Morbid obesity ([place]); STATUS, OTHER TOE(S) AMPUTATION; Hypertension; Anemia; Vitamin D deficiency; Paroxysmal atrial fibrillation ([place]); Preventative health care; Chronic pain syndrome; Nausea; Current moderate episode of major depressive disorder ([place]); Dyspnea; Hypoxia; Diarrhea; Respiratory failure with hypoxia ([place]); Postural dizziness with presyncope; COVID-19 with multiple comorbidities; and Pneumonia due to COVID-19 virus on their problem list.  [facility] Events   12/15 > admit.  Consults:  PCCM.  Procedures:  None.  Significant Diagnostic Tests:  CXR 12/15 > extensive b/l airspace disease with vascular congestion.  Micro Data:  Flu 12/15 > neg. SARS CoV 2 12/15 > pos.  Antimicrobials:  None.   Interim history/subjective:  Increased HFNC setting since prior evaluation overnight. Up to 35 LPM now. Patient is comfortably resting in bed on HFNC, infact it is only in one nare on my arrival.  Says he is feeling better, but does get winded with exertion. O2 sats 100%.  Objective:  Blood pressure 133/73, pulse 61, temperature 98.5 F (36.9 C), temperature source Oral, resp. rate (!) 21, height 6\' 6"  (1.981 m), weight (!) 182.4 kg, SpO2 100 %.    FiO2 (%):  [100 %] 100 %   Intake/Output Summary (Last 24 hours) at 06/29/2019 1058 Last data filed at  06/29/2019 1012 Gross per 24 hour  Intake 968.59 ml  Output 2450 ml  Net -1481.41 ml   Filed Weights   06/28/19 0443  Weight: (!) 182.4 kg    Examination: General: Obese male resting comfortably in bed Neuro: Alert, oriented, non-focal HEENT: [place]/AT. Sclerae anicteric.  EOMI. Cardiovascular: RRR, no MRG. Tele shows some prior tachycardia but current rate 61 Lungs: Respirations even, unlabored  Breath sounds distant but no obvious W/R/R.   Abdomen: Obese, BS x 4, soft, NT/ND.  Musculoskeletal: Left toes amputated. Otherwise no deformity.   Skin: Intact, warm, no rashes.  Assessment & Plan:   Acute hypoxic respiratory failure - 2/2 COVID PNA: patient currently on HFNC 35L/min with O2 sats 100% while resting in bed. HFNC only in one nare on my arrival. Some desaturation and dyspnea with exertion (bedside commode) and takes a bit to recover. - Continue HFNC, wean for O2 sats > 88% with no dyspnea - Permissive hypoxemia if necessary, right now he is satting well - Should he deteriorate we would consider ICU transfer, however, he is appropriate for care in PCU at this time.  - Continue decadron, remdesivir per COVID tx algorithm  - Awake proning if if he can tolerate - Maintain negative I/O balance if able.  - PCCM will be available if needed.   Rest per primary  Best Practice:  Diet: Heart healthy. DVT prophylaxis: Per primary. GI prophylaxis: N/A. Glucose control: Lantus / SSI. Mobility: Up with assist. Code Status: Full. Family Communication: None available. Disposition: Tele.   Georgann Housekeeper, AGACNP-BC [place]  See Amion for personal pager PCCM on call pager [phone]  06/29/2019 11:08 AM

## 2019-06-29 NOTE — Progress Notes (Signed)
Trial off HHFNC and on NRB mask at 15L initiated.

## 2019-06-29 NOTE — Progress Notes (Signed)
   06/28/19 2205  MEWS Score  Resp 19  ECG Heart Rate 73  Pulse Rate 72  SpO2 (!) 89 %  O2 Device HFNC  Patient Activity (if Appropriate) In bed  O2 Flow Rate (L/min) 10 L/min  MEWS Score  MEWS RR 0  MEWS Pulse 0  MEWS Systolic 0  MEWS LOC 0  MEWS Temp 0  MEWS Score 0  MEWS Score Color Green  Provider Notification  Provider Vincenza Hews, MD  Date Provider Notified 06/28/19  Time Provider Notified 2209  Notification Type Call  Notification Reason Other (Comment) (O2 HFNC decreased to 10L with SpO2 88-91% while pt. in bed.)  Response No new orders  Date of Provider Response 06/28/19  Time of Provider Response 2209   Pulmonology MD to see patient. Will continue to monitor.

## 2019-06-29 NOTE — Progress Notes (Signed)
Asked to see patient due to oxygen sats in the low 80's on NRB.  Patient normally would wear CPAP QHS, but cannot due to COVID.  Patient is in very mild distress with RR 20.  Mild abdominal breathing.  Patient noted to have large abdomen that would hinder his ability to self-prone. Patient started on heated high flow, 40 LPM 100% Fi02 with sats increasing to upper 80's, low 90's.  RN at bedside.

## 2019-06-29 NOTE — Progress Notes (Signed)
Mr. Aloisi has tolerated 30+ minutes of trial on NRB mask at 15L. Sats 87-91% with RR 26-28.

## 2019-06-30 LAB — CBC WITH DIFFERENTIAL/PLATELET
Abs Immature Granulocytes: 0 10*3/uL (ref 0.00–0.07)
Abs Immature Granulocytes: 0.05 10*3/uL (ref 0.00–0.07)
Basophils Absolute: 0 10*3/uL (ref 0.0–0.1)
Basophils Absolute: 0 10*3/uL (ref 0.0–0.1)
Basophils Relative: 0 %
Basophils Relative: 0 %
Eosinophils Absolute: 0 10*3/uL (ref 0.0–0.5)
Eosinophils Absolute: 0 10*3/uL (ref 0.0–0.5)
Eosinophils Relative: 1 %
Eosinophils Relative: 0 %
HCT: 54.4 % — ABNORMAL HIGH (ref 39.0–52.0)
HCT: 38.7 % — ABNORMAL LOW (ref 39.0–52.0)
Hemoglobin: 17.6 g/dL — ABNORMAL HIGH (ref 13.0–17.0)
Hemoglobin: 12.2 g/dL — ABNORMAL LOW (ref 13.0–17.0)
Immature Granulocytes: 1 %
Lymphocytes Relative: 2 %
Lymphocytes Relative: 10 %
Lymphs Abs: 0.1 10*3/uL — ABNORMAL LOW (ref 0.7–4.0)
Lymphs Abs: 0.6 10*3/uL — ABNORMAL LOW (ref 0.7–4.0)
MCH: 28.4 pg (ref 26.0–34.0)
MCH: 28.2 pg (ref 26.0–34.0)
MCHC: 32.4 g/dL (ref 30.0–36.0)
MCHC: 31.5 g/dL (ref 30.0–36.0)
MCV: 87.9 fL (ref 80.0–100.0)
MCV: 89.4 fL (ref 80.0–100.0)
Monocytes Absolute: 0.1 10*3/uL (ref 0.1–1.0)
Monocytes Absolute: 0.2 10*3/uL (ref 0.1–1.0)
Monocytes Relative: 3 %
Monocytes Relative: 3 %
Neutro Abs: 2.7 10*3/uL (ref 1.7–7.7)
Neutro Abs: 5.6 10*3/uL (ref 1.7–7.7)
Neutrophils Relative %: 94 %
Neutrophils Relative %: 86 %
Platelets: 121 10*3/uL — ABNORMAL LOW (ref 150–400)
Platelets: 108 10*3/uL — ABNORMAL LOW (ref 150–400)
RBC: 6.19 MIL/uL — ABNORMAL HIGH (ref 4.22–5.81)
RBC: 4.33 MIL/uL (ref 4.22–5.81)
RDW: 15.1 % (ref 11.5–15.5)
RDW: 14.5 % (ref 11.5–15.5)
WBC: 2.9 10*3/uL — ABNORMAL LOW (ref 4.0–10.5)
WBC: 6.4 10*3/uL (ref 4.0–10.5)
nRBC: 0.7 % — ABNORMAL HIGH (ref 0.0–0.2)
nRBC: 1 /100 WBC — ABNORMAL HIGH
nRBC: 0.3 % — ABNORMAL HIGH (ref 0.0–0.2)

## 2019-06-30 LAB — COMPREHENSIVE METABOLIC PANEL
ALT: 62 U/L — ABNORMAL HIGH (ref 0–44)
AST: 86 U/L — ABNORMAL HIGH (ref 15–41)
Albumin: 2.3 g/dL — ABNORMAL LOW (ref 3.5–5.0)
Alkaline Phosphatase: 106 U/L (ref 38–126)
Anion gap: 13 (ref 5–15)
BUN: 45 mg/dL — ABNORMAL HIGH (ref 8–23)
CO2: 23 mmol/L (ref 22–32)
Calcium: 7.7 mg/dL — ABNORMAL LOW (ref 8.9–10.3)
Chloride: 102 mmol/L (ref 98–111)
Creatinine, Ser: 2.13 mg/dL — ABNORMAL HIGH (ref 0.61–1.24)
GFR calc Af Amer: 36 mL/min — ABNORMAL LOW (ref >60)
GFR calc non Af Amer: 31 mL/min — ABNORMAL LOW (ref >60)
Glucose, Bld: 210 mg/dL — ABNORMAL HIGH (ref 70–99)
Potassium: 3.8 mmol/L (ref 3.5–5.1)
Sodium: 138 mmol/L (ref 135–145)
Total Bilirubin: 1.1 mg/dL (ref 0.3–1.2)
Total Protein: 6.3 g/dL — ABNORMAL LOW (ref 6.5–8.1)

## 2019-06-30 LAB — FERRITIN: Ferritin: 668 ng/mL — ABNORMAL HIGH (ref 24–336)

## 2019-06-30 LAB — GLUCOSE, CAPILLARY
Glucose-Capillary: 154 mg/dL — ABNORMAL HIGH (ref 70–99)
Glucose-Capillary: 142 mg/dL — ABNORMAL HIGH (ref 70–99)
Glucose-Capillary: 179 mg/dL — ABNORMAL HIGH (ref 70–99)
Glucose-Capillary: 266 mg/dL — ABNORMAL HIGH (ref 70–99)
Glucose-Capillary: 268 mg/dL — ABNORMAL HIGH (ref 70–99)
Glucose-Capillary: 274 mg/dL — ABNORMAL HIGH (ref 70–99)

## 2019-06-30 LAB — D-DIMER, QUANTITATIVE: D-Dimer, Quant: >20 ug/mL-FEU — ABNORMAL HIGH (ref 0.00–0.50)

## 2019-06-30 LAB — C-REACTIVE PROTEIN: CRP: 12.4 mg/dL — ABNORMAL HIGH (ref <1.0)

## 2019-06-30 MED ORDER — HYDROCODONE-ACETAMINOPHEN 5-325 MG PO TABS
1.5000 | ORAL_TABLET | Freq: Three times a day (TID) | ORAL | Status: DC | PRN
  Administered 2019-06-30 – 2019-07-02 (×5): 1.5 via ORAL
  Filled 2019-06-30 (×5): qty 2

## 2019-06-30 MED ORDER — DEXAMETHASONE SODIUM PHOSPHATE 10 MG/ML IJ SOLN
6.0000 mg | INTRAMUSCULAR | Status: DC
  Administered 2019-06-30: 6 mg via INTRAVENOUS
  Filled 2019-06-30: qty 1

## 2019-06-30 MED ORDER — METOPROLOL TARTRATE 5 MG/5ML IV SOLN
5.0000 mg | INTRAVENOUS | Status: AC | PRN
  Administered 2019-06-30 – 2019-07-01 (×3): 5 mg via INTRAVENOUS
  Filled 2019-06-30 (×3): qty 5

## 2019-06-30 MED ORDER — CHLORHEXIDINE GLUCONATE CLOTH 2 % EX PADS
6.0000 | MEDICATED_PAD | Freq: Every day | CUTANEOUS | Status: DC
  Administered 2019-06-30 – 2019-07-21 (×18): 6 via TOPICAL

## 2019-06-30 NOTE — Progress Notes (Signed)
Updated spouse on his transfer to [facility]. She appreciated the information and would like to be kept informed on how he's doing.

## 2019-06-30 NOTE — Progress Notes (Signed)
  Date: 06/30/2019  Patient name: [name]  Medical record number: [id number]  Date of birth: 05/11/1951    Subjective:  -Attempted transfer to [facility] last night, but desatted into 70s and transport aborted -Feeling tired today, breathing a little worse, still having pain in buttocks  -Was mouth breathing this morning, switched to NRB with good effect -Dexamethasone not given last night, unclear why it was d/c'd  -Negative 3150 cc yesterday  Objective:  Vital signs in last 24 hours: Vitals:   06/30/19 0702 06/30/19 0843 06/30/19 1200 06/30/19 1205  BP:    (!) 176/92  Pulse:  66  72  Resp: (!) 25 (!) 26  (!) 27  Temp:    98.8 F (37.1 C)  TempSrc:    Oral  SpO2: (!) 89% 94% 92% 92%  Weight:      Height:        Physical Exam: General: Obese man sitting up in bed, no respiratory distress HEENT: HFNC in place, normal mucous membranes Neck: No JVD evident, but difficult to assess given neck size Heart: Regular rate and rhythm, good pulses and warm extremities, trace edema Lungs: Distant lung sounds, no other significant findings, normal respiratory effort Abdomen: Obese but soft and nontender, active bowel sounds Skin: No significant rashes  Significant new test results: Creatinine 2.7--> 2.2--> 2.1  Ferritin 719--> 879--> 913--> 668 CRP 22.2--> 21.2--> 17.6--> 12.4 D-dimer 1.6--> 2.0--> 6.9--> >20  Assessment/Plan:  Principal Problem:   COVID-19 with multiple comorbidities Active Problems:   Diabetes type 2, uncontrolled ([place])   Morbid obesity ([facility])   Hypertension   Paroxysmal atrial fibrillation with RVR ([place])   Respiratory failure with hypoxia ([place])   Pneumonia due to COVID-76 virus  68 year old man with a history of DM, HTN, [name], morbid obesity, OSA, and CKD stage II admitted with acute hypoxic respiratory failure due to severe COVID-19.  1. Acute hypoxic respiratory failure due to COVID-19:  Doing better on non-rebreather this morning, will try again  to transfer to [facility] -Continue O2 support as needed -Continue remdesivir and dexamethasone (started 12/15) -Already on anticoagulation, no need to consider VTE prophylaxis -looks dry today, will hold diuresis for one day -goal O2 sat >85%  2. Elevated transaminases:  Likely related to COVID-19, no further work-up at this time  3. AKI on CKD stage II:  Slightly improved from yesterday, looks dry, holding diuresis today, will likely resume tomorrow  4. T2DM:  Last A1c 8.2%, home medications include Humulin 68 units daily and liraglutide 1.8 mg daily -Continue Lantus 40 units nightly and sliding scale insulin while hospitalized, will continue to reassess insulin needs during acute illness  5. HTN, [name], paroxysmal atrial fibrillation: RVR has improved, back in sinus today -Holding enalapril given AKI -Continue metoprolol succinate 25 mg daily, can give additional doses of IV metoprolol 5 mg prn if Afib with RVR again -Continue atorvastatin 20 mg daily -Continue apixaban 5 mg twice daily  6. OSA: -Holding CPAP in setting of COVID-19  [id number] modified Prophylaxis:On apixaban Code Status:Full code, confirmed with patient at bedside on admission  Dispo: Anticipatepossible prolonged course given severe COVID-19 and multiple comorbidities, continues to require inpatient care for acute hypoxic respiratory failure, planning transfer to [facility] today  Lenice Pressman, M.D., Ph.D. 06/30/2019, 12:18 PM

## 2019-06-30 NOTE — Progress Notes (Signed)
[facility] just picked up to transport to [facility]

## 2019-06-30 NOTE — Progress Notes (Signed)
Clarified with Elzie Rings, e-link nurse, Dr. Verlee Monte wants patient to transfer to 59M. Will transfer patient to 59M.

## 2019-06-30 NOTE — Progress Notes (Signed)
Pt assessed by RT. Pt prefers breathing through his mouth vs his nose. Pt was on 40L and 100% via [place] with SATs of 89-90 and [place] directed towards mouth. For pt's comfort RT switched him to 100% NRB flush on flowmeter. SATs are 93-94% at this time and pt's vitals are stable. He appears comfortable and trying to rest.

## 2019-06-30 NOTE — Progress Notes (Addendum)
Called Dr. Verlee Monte concerning transfer to 54M. No answer. Called and Spoke with [place] regarding patient transfer to 54[facility]. Gretchen to call Dr. Verlee Monte to clarify regarding patient transfer to 54M.

## 2019-06-30 NOTE — Progress Notes (Signed)
[facility] at bedside, patient on 15L non rebreather desaturating into the 70s, HR 130, RR 30.

## 2019-06-30 NOTE — Progress Notes (Signed)
Report called to Vivien Rota [phone] at [facility]

## 2019-06-30 NOTE — Progress Notes (Signed)
S: Alerted that pt tachypneic and desaturated to 70s with NRB after trial of conventional HFNC to see if he was stable enough for transport to [facility]. Back to where he was after our earlier evaluation this evening on HHFNC 100% and 40L. Not in distress.   O: tachycardic/irregular 130s, BP soft, RR 20s, O2 saturation 90 on HHFNC  Assessment and Plan:  # AHRF from covid-19: Given h/o OSA and his night time desaturation would love to trial CPAP.  - will transfer to 20M MICU for trial of CPAP with ventilator in negative pressure room. This was discussed with RT.  # AF/RVR: - Increase metoprolol to 25 q6 - eliquis  This patient is critically ill with acute hypoxic respiratory failure; which, requires frequent high complexity decision making, assessment, support, evaluation, and titration of therapies. This was completed through the application of advanced monitoring technologies and extensive interpretation of multiple databases. During this encounter critical care time was devoted to patient care services described in this note for 30 minutes.

## 2019-06-30 NOTE — Progress Notes (Signed)
Discussed again with Dr. [place] Bells and Dr. Thereasa Solo at [facility], will try again to transfer today. Please note they recommended tolerating lower sats for transport, so desatting into 70s should not preclude transportation.  Full progress note to follow

## 2019-06-30 NOTE — Progress Notes (Signed)
   NAME:  [name], MRN:  [id number], DOB:  [birth year], LOS: 3 ADMISSION DATE:  06/23/2019, CONSULTATION DATE:  06/28/19 REFERRING MD:  Rebeca Alert  CHIEF COMPLAINT:  Hypoxia   Brief History   [name] is a 68 y.o. male who was admitted 12/15 with COVID PNA.  PCCM asked to see 12/16 due to increasing O2 requirements.  Past Medical History  has Diabetes type 2, uncontrolled ([name]); Obstructive sleep apnea; Diabetic peripheral neuropathy associated with type 2 diabetes mellitus ([name]); DIABETIC MACULAR EDEMA; HEARING LOSS, SENSORINEURAL, BILATERAL; GERD; Chronic kidney disease, stage III (moderate) ([name]); Morbid obesity ([name]); STATUS, OTHER TOE(S) AMPUTATION; Hypertension; Anemia; Vitamin D deficiency; Paroxysmal atrial fibrillation with RVR ([name]); Preventative health care; Chronic pain syndrome; Nausea; Current moderate episode of major depressive disorder ([name]); Dyspnea; Hypoxia; Diarrhea; Respiratory failure with hypoxia ([name]); Postural dizziness with presyncope; COVID-19 with multiple comorbidities; and Pneumonia due to COVID-19 virus on their problem list.  [name] Events   12/15 > admit. 12/17-did have significant desaturation events overnight Consults:  PCCM.  Procedures:  None.  Significant Diagnostic Tests:  CXR 12/15 > extensive b/l airspace disease with vascular congestion.  Micro Data:  Flu 12/15 > neg. SARS CoV 2 12/15 > pos.  Antimicrobials:  Remdesivir  Interim history/subjective:  Generally feels better Shortness of breath with any activity O2 cannula especially out of his nostrils when I saw him Denies any pain, does have some abdominal discomfort from coughing Saturations 90 to 93% with cannula in his nostrils  Objective:  Blood pressure (!) 146/83, pulse (!) 127, temperature 97.7 F (36.5 C), temperature source Oral, resp. rate (!) 25, height 6\' 6"  (1.981 m), weight (!) 182.4 kg, SpO2 (!) 89 %.    FiO2 (%):  [100 %] 100 %    Intake/Output Summary (Last 24 hours) at 06/30/2019 0816 Last data filed at 06/30/2019 0447 Gross per 24 hour  Intake --  Output 3150 ml  Net -3150 ml   Filed Weights   06/28/19 0443  Weight: (!) 182.4 kg    Examination: General: Obese, comfortable Neuro: Alert and oriented, nonfocal HEENT: [place]/AT.  Anicteric, Cardiovascular: S1-S2 appreciated Lungs: Poor air movement secondary to body habitus Abdomen: Obese, bowel sounds appreciated Musculoskeletal: Left toes amputated. Otherwise no deformity.   Skin: Intact, warm, no rashes.  Assessment & Plan:   Acute hypoxic respiratory failure - 2/2 COVID PNA:  Continues on supplemental high flow oxygen at present Oxygen saturations acceptable with nasal cannula in his nostrils -He still does report shortness of breath with any activity -Continue high flow nasal cannula, wean for saturations greater than 88%  He does appear hemodynamically stable, Respiratory status is stable on supplemental oxygen-via high flow nasal cannula  I believe patient is stable for transfer to [place]  Continue Decadron, remdesivir per algorithm Proning as tolerated-his body habitus will make this difficult  Negative fluid balance as able  Will follow   Stable for transfer to [place]:  Diet: Heart healthy. DVT prophylaxis: Per primary. Glucose control: Lantus / SSI. Mobility: Up with assist. Code Status: Full. Family Communication: None available. Disposition: Tele.   Sherrilyn Rist, MD [place], PCCM Cell: [phone]

## 2019-06-30 NOTE — Progress Notes (Signed)
Called and notified patient's wife Early of patient arrival to [facility], patient spoke to wife on the phone for a couple of minutes.

## 2019-06-30 NOTE — Progress Notes (Signed)
Report given to 76M nurse. Planned to transfer patient after shift change. Received phone call from [place] at rapid response to hold on transfer to let MD on day shift reassess patient and make that decision.

## 2019-06-30 NOTE — Progress Notes (Signed)
Patient's HR jumping from 130's to 185 intermittently. Paging doctor. Will continue to monitor.

## 2019-06-30 NOTE — Progress Notes (Signed)
Plan of care was to originally transfer Mr. Buchko to 39M (after not being able to transfer to [facility]) for NIV. However, at this time that is not a treatment modality available for him on this campus. Since he remains on the same level of support HHFNC and is still oriented with no distress and minimal increased WOB, Pt will be reassessed by the medical team regarding escalation of care. Discussed with Mickle Plumb on .   Nursing staff will notify PCCM and/or RRRN if pt decompensates or has a change in mental status.

## 2019-07-01 ENCOUNTER — Inpatient Hospital Stay (HOSPITAL_COMMUNITY): Payer: Medicare Other

## 2019-07-01 DIAGNOSIS — R791 Abnormal coagulation profile: Secondary | ICD-10-CM

## 2019-07-01 LAB — ECHOCARDIOGRAM COMPLETE
Height: 78 in
Weight: 6419.8 oz

## 2019-07-01 LAB — COMPREHENSIVE METABOLIC PANEL
ALT: 49 U/L — ABNORMAL HIGH (ref 0–44)
AST: 59 U/L — ABNORMAL HIGH (ref 15–41)
Albumin: 2.2 g/dL — ABNORMAL LOW (ref 3.5–5.0)
Alkaline Phosphatase: 127 U/L — ABNORMAL HIGH (ref 38–126)
Anion gap: 13 (ref 5–15)
BUN: 49 mg/dL — ABNORMAL HIGH (ref 8–23)
CO2: 22 mmol/L (ref 22–32)
Calcium: 7.5 mg/dL — ABNORMAL LOW (ref 8.9–10.3)
Chloride: 104 mmol/L (ref 98–111)
Creatinine, Ser: 1.92 mg/dL — ABNORMAL HIGH (ref 0.61–1.24)
GFR calc Af Amer: 40 mL/min — ABNORMAL LOW (ref >60)
GFR calc non Af Amer: 35 mL/min — ABNORMAL LOW (ref >60)
Glucose, Bld: 290 mg/dL — ABNORMAL HIGH (ref 70–99)
Potassium: 4.4 mmol/L (ref 3.5–5.1)
Sodium: 139 mmol/L (ref 135–145)
Total Bilirubin: 1.3 mg/dL — ABNORMAL HIGH (ref 0.3–1.2)
Total Protein: 6 g/dL — ABNORMAL LOW (ref 6.5–8.1)

## 2019-07-01 LAB — C-REACTIVE PROTEIN: CRP: 23.6 mg/dL — ABNORMAL HIGH (ref <1.0)

## 2019-07-01 LAB — GLUCOSE, CAPILLARY
Glucose-Capillary: 300 mg/dL — ABNORMAL HIGH (ref 70–99)
Glucose-Capillary: 255 mg/dL — ABNORMAL HIGH (ref 70–99)
Glucose-Capillary: 237 mg/dL — ABNORMAL HIGH (ref 70–99)
Glucose-Capillary: 258 mg/dL — ABNORMAL HIGH (ref 70–99)
Glucose-Capillary: 343 mg/dL — ABNORMAL HIGH (ref 70–99)

## 2019-07-01 LAB — SAMPLE TO BLOOD BANK

## 2019-07-01 LAB — CBC WITH DIFFERENTIAL/PLATELET
Basophils Absolute: 0 10*3/uL (ref 0.0–0.1)
Basophils Relative: 0 %
Eosinophils Absolute: 0 10*3/uL (ref 0.0–0.5)
Eosinophils Relative: 0 %
HCT: 36.5 % — ABNORMAL LOW (ref 39.0–52.0)
Hemoglobin: 11.4 g/dL — ABNORMAL LOW (ref 13.0–17.0)
Lymphocytes Relative: 6 %
Lymphs Abs: 0.4 10*3/uL — ABNORMAL LOW (ref 0.7–4.0)
MCH: 27.7 pg (ref 26.0–34.0)
MCHC: 31.2 g/dL (ref 30.0–36.0)
MCV: 88.8 fL (ref 80.0–100.0)
Monocytes Absolute: 0.2 10*3/uL (ref 0.1–1.0)
Monocytes Relative: 3 %
Neutro Abs: 6.7 10*3/uL (ref 1.7–7.7)
Neutrophils Relative %: 90 %
Platelets: 83 10*3/uL — ABNORMAL LOW (ref 150–400)
RBC: 4.11 MIL/uL — ABNORMAL LOW (ref 4.22–5.81)
RDW: 14.5 % (ref 11.5–15.5)
WBC: 7.5 10*3/uL (ref 4.0–10.5)
nRBC: 0 % (ref 0.0–0.2)

## 2019-07-01 LAB — PROCALCITONIN: Procalcitonin: 0.86 ng/mL

## 2019-07-01 LAB — D-DIMER, QUANTITATIVE: D-Dimer, Quant: >20 ug/mL-FEU — ABNORMAL HIGH (ref 0.00–0.50)

## 2019-07-01 LAB — ABO/RH: ABO/RH(D): A POS

## 2019-07-01 LAB — MAGNESIUM: Magnesium: 2 mg/dL (ref 1.7–2.4)

## 2019-07-01 LAB — FERRITIN: Ferritin: 541 ng/mL — ABNORMAL HIGH (ref 24–336)

## 2019-07-01 MED ORDER — PANTOPRAZOLE SODIUM 40 MG PO TBEC
40.0000 mg | DELAYED_RELEASE_TABLET | Freq: Every day | ORAL | Status: DC
  Administered 2019-07-01 – 2019-07-19 (×19): 40 mg via ORAL
  Filled 2019-07-01 (×20): qty 1

## 2019-07-01 MED ORDER — METOPROLOL TARTRATE 5 MG/5ML IV SOLN
5.0000 mg | INTRAVENOUS | Status: AC | PRN
  Administered 2019-07-08 (×3): 5 mg via INTRAVENOUS
  Filled 2019-07-01 (×3): qty 5

## 2019-07-01 MED ORDER — TRAZODONE HCL 50 MG PO TABS: 50.0000 mg | ORAL_TABLET | Freq: Every evening | ORAL | Status: DC | PRN

## 2019-07-01 MED ORDER — ENOXAPARIN SODIUM 300 MG/3ML IJ SOLN
1.0000 mg/kg | Freq: Two times a day (BID) | INTRAMUSCULAR | Status: DC
  Administered 2019-07-01 – 2019-07-05 (×9): 180 mg via SUBCUTANEOUS
  Filled 2019-07-01 (×10): qty 1.8

## 2019-07-01 MED ORDER — METHYLPREDNISOLONE SODIUM SUCC 125 MG IJ SOLR
60.0000 mg | Freq: Three times a day (TID) | INTRAMUSCULAR | Status: AC
  Administered 2019-07-01 – 2019-07-04 (×12): 60 mg via INTRAVENOUS
  Filled 2019-07-01 (×12): qty 2

## 2019-07-01 MED ORDER — SODIUM CHLORIDE 0.9% IV SOLUTION: Freq: Once | INTRAVENOUS | Status: AC

## 2019-07-01 MED ORDER — INSULIN GLARGINE 100 UNIT/ML ~~LOC~~ SOLN
10.0000 [IU] | SUBCUTANEOUS | Status: AC
  Administered 2019-07-01: 10 [IU] via SUBCUTANEOUS
  Filled 2019-07-01: qty 0.1

## 2019-07-01 MED ORDER — METOPROLOL TARTRATE 5 MG/5ML IV SOLN: 5.0000 mg | INTRAVENOUS | Status: DC | PRN

## 2019-07-01 MED ORDER — LINAGLIPTIN 5 MG PO TABS
5.0000 mg | ORAL_TABLET | Freq: Every day | ORAL | Status: DC
  Administered 2019-07-01 – 2019-07-21 (×20): 5 mg via ORAL
  Filled 2019-07-01 (×21): qty 1

## 2019-07-01 MED ORDER — INSULIN GLARGINE 100 UNIT/ML ~~LOC~~ SOLN
50.0000 [IU] | Freq: Every day | SUBCUTANEOUS | Status: DC
  Administered 2019-07-01: 50 [IU] via SUBCUTANEOUS
  Filled 2019-07-01: qty 0.5

## 2019-07-01 NOTE — Progress Notes (Signed)
Dr. Oletta Darter notified of patient going in and out of Afib with rate 120-140, patient currently in afib, O2 sats 85% on HHFNC 40L 100%, and 15L NRB. Order received to give PRN IV metoprolol

## 2019-07-01 NOTE — Progress Notes (Addendum)
PROGRESS NOTE    [name]  [id number] DOB: 05/01/1951 DOA: 07/02/2019 PCP: Aldine Contes, MD   Brief Narrative:  68 yo with T2DM c/b peripheral neuropathy, OSA, morbid obesity, CKD III, atrial fibrillation, hypertension, chronic pain, anemia, and multiple other medical problems who presented on 12/15 with 1 week of SOB, difficulty sleeping, poor PO intake, and presyncopal symptoms.  He was admitted from the internal medicine clinic.  He's received steroids and remdesivir.  Plan for plasma today.  Assessment & Plan:   Principal Problem:   COVID-19 with multiple comorbidities Active Problems:   Diabetes type 2, uncontrolled ([place])   Morbid obesity (HCC)   Hypertension   Paroxysmal atrial fibrillation with RVR (HCC)   Respiratory failure with hypoxia (HCC)   Pneumonia due to COVID-19 virus  Acute Hypoxic Respiratory Failure 2/2 COVID 19 Pneumonia:  Severely hypoxic on 40 L HHFNC and NRB Continue steroids and remdesivir (day 5 of remdesivir today) Discussed risks/benefits convalescent plasma, he reviewed consent and has signed.  Agreeable after discussion. Considered actemra, but elevated procal on presentation.  Downtrending, but will hold for now.  Discussed risks/benefits, lack of clear evidence suggesting benefit of actemra.  He's agreeable to this if needed.  No history of TB or hepatitis.  I/O, daily weights Prone as able, IS  COVID-19 Labs  Recent Labs    06/29/19 0457 06/30/19 0530 07/01/19 0636  DDIMER 6.94* >20.00* >20.00*  FERRITIN 913* 668* 541*  CRP 17.6* 12.4* 23.6*    Lab Results  Component Value Date   SARSCOV2NAA POSITIVE (Elica Almas) 06/18/2019   [place] NEGATIVE 01/24/2019   Elevated D dimer: while on eliquis, he did miss some days while he was sick.  Transition to full dose lovenox and follow LE [place] and echo.    Atrial Fibrillation with RVR: continue metoprolol PO with IV metop prn for sustained HR over 120.  Follow EKG.  Anticoagulation as  noted above.  AKI on CKD III: baseline creatinine 1.5, presented with creatinine of 3.  Improving.  Follow.  ACE on hold.  Elevated LFT's: mild, likely 2/2 above  Type 2 DM: increased lantus, SSI, start tradjenta due to evidence of benefit in COVID 19 patients while admitted.  Adjust as needed.  HTN  [name]: continue metop.  Ace on hold.  Continue atorva.  OSA: CPAP on hold given COVID 19 infection  Insomnia: trazodone prn qhs  Thrombocytopenia: follow on anticoagulation, possibly related to viral illness  DVT prophylaxis: lovenox Code Status: full Family Communication: none at bedside - discussed with wife 12/19 Disposition Plan: pending   Consultants:   PCCM  Procedures:  Echo and LE [place] pending  Antimicrobials:  Anti-infectives (From admission, onward)   Start     Dose/Rate Route Frequency Ordered Stop   06/28/19 2100  remdesivir 100 mg in sodium chloride 0.9 % 100 mL IVPB     100 mg 200 mL/hr over 30 Minutes Intravenous Daily 07/12/2019 2050 07/02/19 2059   06/24/2019 2100  remdesivir 200 mg in sodium chloride 0.9% 250 mL IVPB     200 mg 580 mL/hr over 30 Minutes Intravenous Once 07/10/2019 2050 06/28/19 0823     Subjective: C/o SOB, started last Tuesday No CP  Objective: Vitals:   07/01/19 1115 07/01/19 1130 07/01/19 1145 07/01/19 1200  BP:    (!) 155/87  Pulse: 75 80 72 75  Resp: (!) 27 (!) 22 (!) 27 (!) 23  Temp:    98.4 F (36.9 C)  TempSrc:  Axillary  SpO2: 98% 96% 93% 97%  Weight:      Height:        Intake/Output Summary (Last 24 hours) at 07/01/2019 1355 Last data filed at 07/01/2019 1100 Gross per 24 hour  Intake 277.56 ml  Output 1200 ml  Net -922.44 ml   Filed Weights   06/28/19 0443 06/30/19 1900  Weight: (!) 182.4 kg (!) 182 kg    Examination:  General exam: Appears calm and comfortable  Respiratory system: distant breath sounds, mild tachypnea and increased WOB on 40 L [place] and NRB Cardiovascular system: RRR Gastrointestinal system:  Abdomen is nondistended, soft and nontender.  Central nervous system: Alert and oriented. No focal neurological deficits. 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 Mood & affect appropriate.     Data Reviewed: I have personally reviewed following labs and imaging studies  CBC: Recent Labs  Lab 06/26/2019 1428 06/28/19 0423 06/29/19 0457 06/30/19 0530 07/01/19 0636  WBC 4.8 4.4 2.9* 6.4 7.5  NEUTROABS 3.9 3.9 2.7 5.6 6.7  HGB 11.5* 10.8* 17.6* 12.2* 11.4*  HCT 36.2* 33.2* 54.4* 38.7* 36.5*  MCV 89.6 87.8 87.9 89.4 88.8  PLT 108* 124* 121* 108* 83*   Basic Metabolic Panel: Recent Labs  Lab 07/11/2019 1428 06/28/19 0423 06/29/19 0457 06/30/19 0530 07/01/19 0636  NA 135 138 139 138 139  K 4.4 4.0 4.2 3.8 4.4  CL 100 106 105 102 104  CO2 22 20* 23 23 22   GLUCOSE 265* 205* 185* 210* 290*  BUN 42* 41* 41* 45* 49*  CREATININE 3.06* 2.67* 2.17* 2.13* 1.92*  CALCIUM 7.6* 7.1* 7.8* 7.7* 7.5*  MG  --   --   --   --  2.0   GFR: Estimated Creatinine Clearance: 65.5 mL/min (Jaggar Benko) (by C-G formula based on SCr of 1.92 mg/dL (H)). Liver Function Tests: Recent Labs  Lab 06/29/2019 1428 06/28/19 0423 06/29/19 0457 06/30/19 0530 07/01/19 0636  AST 111* 142* 135* 86* 59*  ALT 47* 63* 73* 62* 49*  ALKPHOS 76 70 87 106 127*  BILITOT 1.1 1.1 0.8 1.1 1.3*  PROT 6.5 6.4* 6.2* 6.3* 6.0*  ALBUMIN 2.7* 2.3* 2.3* 2.3* 2.2*   No results for input(s): LIPASE, AMYLASE in the last 168 hours. No results for input(s): AMMONIA in the last 168 hours. Coagulation Profile: No results for input(s): INR, PROTIME in the last 168 hours. Cardiac Enzymes: No results for input(s): CKTOTAL, CKMB, CKMBINDEX, TROPONINI in the last 168 hours. BNP (last 3 results) No results for input(s): PROBNP in the last 8760 hours. HbA1C: No results for input(s): HGBA1C in the last 72 hours. CBG: Recent Labs  Lab 06/30/19 1052 06/30/19 1216 06/30/19 2156  07/01/19 0734 07/01/19 1208  GLUCAP 266* 268* 274* 300* 255*   Lipid Profile: No results for input(s): CHOL, HDL, LDLCALC, TRIG, CHOLHDL, LDLDIRECT in the last 72 hours. Thyroid Function Tests: No results for input(s): TSH, T4TOTAL, FREET4, T3FREE, THYROIDAB in the last 72 hours. Anemia Panel: Recent Labs    06/30/19 0530 07/01/19 0636  FERRITIN 668* 541*   Sepsis Labs: Recent Labs  Lab 07/10/2019 1428 06/21/2019 2030 07/01/19 0636  PROCALCITON  --  1.40 0.86  LATICACIDVEN 1.7  --   --     Recent Results (from the past 240 hour(s))  Respiratory Panel by RT PCR (Flu Kaisyn Millea&B, Covid) -     Status: Abnormal   Collection Time: 06/25/2019  1:50 PM  Result Value Ref Range Status  SARS Coronavirus 2 by RT PCR POSITIVE (Jan Olano) NEGATIVE Final    Comment: RESULT CALLED TO, READ BACK BY AND VERIFIED WITH: O AGYEI MD 07/08/2019 1807 JDW (NOTE) SARS-CoV-2 target nucleic acids are DETECTED. SARS-CoV-2 RNA is generally detectable in upper respiratory specimens  during the acute phase of infection. Positive results are indicative of the presence of the identified virus, but do not rule out bacterial infection or co-infection with other pathogens not detected by the test. Clinical correlation with patient history and other diagnostic information is necessary to determine patient infection status. The expected result is Negative. Fact Sheet for Patients:  PinkCheek.be Fact Sheet for Healthcare Providers: GravelBags.it This test is not yet approved or cleared by the [place] FDA and  has been authorized for detection and/or diagnosis of SARS-CoV-2 by FDA under an Emergency Use Authorization (EUA).  This EUA will remain in effect (meaning this test can be used) for the  duration of  the COVID-19 declaration under Section 564(b)(1) of the Act, 21 U.S.C. section 360bbb-3(b)(1), unless the authorization is terminated or revoked sooner.     Influenza Verginia Toohey by PCR NEGATIVE NEGATIVE Final   Influenza B by PCR NEGATIVE NEGATIVE Final    Comment: (NOTE) The Xpert Xpress SARS-CoV-2/FLU/RSV assay is intended as an aid in  the diagnosis of influenza from Nasopharyngeal swab specimens and  should not be used as Issaiah Seabrooks sole basis for treatment. Nasal washings and  aspirates are unacceptable for Xpert Xpress SARS-CoV-2/FLU/RSV  testing. Fact Sheet for Patients: PinkCheek.be Fact Sheet for Healthcare Providers: GravelBags.it This test is not yet approved or cleared by the [place] FDA and  has been authorized for detection and/or diagnosis of SARS-CoV-2 by  FDA under an Emergency Use Authorization (EUA). This EUA will remain  in effect (meaning this test can be used) for the duration of the  Covid-19 declaration under Section 564(b)(1) of the Act, 21  U.S.C. section 360bbb-3(b)(1), unless the authorization is  terminated or revoked. Performed at [facility], [place] [address], [place], [place] [address]   MRSA PCR Screening     Status: None   Collection Time: 06/28/19  1:35 PM   Specimen: Nasopharyngeal  Result Value Ref Range Status   MRSA by PCR NEGATIVE NEGATIVE Final    Comment:        The GeneXpert MRSA Assay (FDA approved for NASAL specimens only), is one component of Jerett Odonohue comprehensive MRSA colonization surveillance program. It is not intended to diagnose MRSA infection nor to guide or monitor treatment for MRSA infections. Performed at [facility], [place] [address], [place], [place] [address]          Radiology Studies: No results found.      Scheduled Meds: . sodium chloride   Intravenous Once  . atorvastatin  20 mg Oral q1800  . Chlorhexidine Gluconate Cloth  6 each Topical Daily  . enoxaparin (LOVENOX) injection  1 mg/kg Subcutaneous Q12H  . insulin aspart  0-20 Units Subcutaneous TID WC  . insulin aspart  0-5 Units Subcutaneous QHS  .  insulin glargine  50 Units Subcutaneous QHS  . mouth rinse  15 mL Mouth Rinse BID  . methylPREDNISolone (SOLU-MEDROL) injection  60 mg Intravenous Q8H  . metoprolol tartrate  12.5 mg Oral Q6H  . sodium chloride flush  3 mL Intravenous Q12H   Continuous Infusions: . sodium chloride Stopped (07/01/19 0355)  . remdesivir 100 mg in NS 100 mL Stopped (06/30/19 2103)  LOS: 4 days    Time spent: over 30 min. 50 min critical care time with AHRF 2/2 COVID pneumonia    Fayrene Helper, MD [facility] Pager AMION  If 7PM-7AM, please contact night-coverage [email or web address] Password [email or web address] 07/01/2019, 1:55 PM

## 2019-07-01 NOTE — Plan of Care (Signed)

## 2019-07-01 NOTE — Progress Notes (Signed)
Patient heart rhythm converted from NSR to Afib with rate 120-145, 5 mg of Metoprolol IVPush given per PRN order.

## 2019-07-01 NOTE — Progress Notes (Signed)
eLink Physician-Brief Progress Note Patient Name: [name] DOB: [birth year] MRN: [id number]   Date of Service  07/01/2019  HPI/Events of Note  AFIB with RVR - Ventricular rate = 118 to 130. BP = 160/101. Nurse has give Metoprolol 5 mg IV X 1. CMP already drawn.   eICU Interventions  Plan: 1. OK to give another Metoprolol 5 mg IV per orders.  2. Add Mg++ level to CMP.     Intervention Category Major Interventions: Arrhythmia - evaluation and management  [name] [name] 07/01/2019, 4:06 AM

## 2019-07-01 NOTE — Progress Notes (Signed)
  Echocardiogram 2D Echocardiogram has been performed.  [name] 07/01/2019, 3:36 PM

## 2019-07-01 NOTE — Progress Notes (Deleted)
Spoke with Mrs. [name], pt wife. Updated on Pt condition. Answered all questions.

## 2019-07-01 NOTE — Progress Notes (Signed)
Patient converted back on NSR, rate of 72.

## 2019-07-01 NOTE — TOC Progression Note (Signed)
Transition of Care [facility]) - Progression Note    Patient Details  Name: PRATHIK MAGOUIRK MRN: [id number] Date of Birth: Jun 11, 1951  [facility]) CM/SW Contact  Loletha Grayer Beverely Pace, RN Phone Number: 07/01/2019, 8:01 AM  Clinical Narrative:   [facility] Case manager continues to follow patient. Remains on 40LHFNC and 15LNRB. Not medically ready to move out of ICU. May he be blessed to recover.          Expected Discharge Plan and Services                                                 Social Determinants of Health (SDOH) Interventions    Readmission Risk Interventions No flowsheet data found.

## 2019-07-02 ENCOUNTER — Encounter (HOSPITAL_COMMUNITY): Payer: Self-pay | Admitting: Internal Medicine

## 2019-07-02 ENCOUNTER — Other Ambulatory Visit: Payer: Self-pay

## 2019-07-02 ENCOUNTER — Inpatient Hospital Stay (HOSPITAL_COMMUNITY): Payer: Medicare Other

## 2019-07-02 DIAGNOSIS — U071 COVID-19: Secondary | ICD-10-CM

## 2019-07-02 DIAGNOSIS — R7989 Other specified abnormal findings of blood chemistry: Secondary | ICD-10-CM

## 2019-07-02 LAB — CBC WITH DIFFERENTIAL/PLATELET
Abs Immature Granulocytes: 0.16 10*3/uL — ABNORMAL HIGH (ref 0.00–0.07)
Basophils Absolute: 0 10*3/uL (ref 0.0–0.1)
Basophils Relative: 0 %
Eosinophils Absolute: 0 10*3/uL (ref 0.0–0.5)
Eosinophils Relative: 0 %
HCT: 35.8 % — ABNORMAL LOW (ref 39.0–52.0)
Hemoglobin: 11.3 g/dL — ABNORMAL LOW (ref 13.0–17.0)
Immature Granulocytes: 2 %
Lymphocytes Relative: 5 %
Lymphs Abs: 0.5 10*3/uL — ABNORMAL LOW (ref 0.7–4.0)
MCH: 27.9 pg (ref 26.0–34.0)
MCHC: 31.6 g/dL (ref 30.0–36.0)
MCV: 88.4 fL (ref 80.0–100.0)
Monocytes Absolute: 0.2 10*3/uL (ref 0.1–1.0)
Monocytes Relative: 2 %
Neutro Abs: 7.9 10*3/uL — ABNORMAL HIGH (ref 1.7–7.7)
Neutrophils Relative %: 91 %
Platelets: 95 10*3/uL — ABNORMAL LOW (ref 150–400)
RBC: 4.05 MIL/uL — ABNORMAL LOW (ref 4.22–5.81)
RDW: 14.5 % (ref 11.5–15.5)
WBC: 8.7 10*3/uL (ref 4.0–10.5)
nRBC: 0 % (ref 0.0–0.2)

## 2019-07-02 LAB — COMPREHENSIVE METABOLIC PANEL
ALT: 44 U/L (ref 0–44)
AST: 50 U/L — ABNORMAL HIGH (ref 15–41)
Albumin: 2.3 g/dL — ABNORMAL LOW (ref 3.5–5.0)
Alkaline Phosphatase: 155 U/L — ABNORMAL HIGH (ref 38–126)
Anion gap: 12 (ref 5–15)
BUN: 52 mg/dL — ABNORMAL HIGH (ref 8–23)
CO2: 23 mmol/L (ref 22–32)
Calcium: 7.7 mg/dL — ABNORMAL LOW (ref 8.9–10.3)
Chloride: 101 mmol/L (ref 98–111)
Creatinine, Ser: 1.78 mg/dL — ABNORMAL HIGH (ref 0.61–1.24)
GFR calc Af Amer: 44 mL/min — ABNORMAL LOW (ref >60)
GFR calc non Af Amer: 38 mL/min — ABNORMAL LOW (ref >60)
Glucose, Bld: 384 mg/dL — ABNORMAL HIGH (ref 70–99)
Potassium: 4.2 mmol/L (ref 3.5–5.1)
Sodium: 136 mmol/L (ref 135–145)
Total Bilirubin: 1.7 mg/dL — ABNORMAL HIGH (ref 0.3–1.2)
Total Protein: 6.1 g/dL — ABNORMAL LOW (ref 6.5–8.1)

## 2019-07-02 LAB — BPAM FFP
Blood Product Expiration Date: 202012201539
ISSUE DATE / TIME: 202012191542
Unit Type and Rh: 6200

## 2019-07-02 LAB — PREPARE FRESH FROZEN PLASMA

## 2019-07-02 LAB — GLUCOSE, CAPILLARY
Glucose-Capillary: 376 mg/dL — ABNORMAL HIGH (ref 70–99)
Glucose-Capillary: 343 mg/dL — ABNORMAL HIGH (ref 70–99)
Glucose-Capillary: 294 mg/dL — ABNORMAL HIGH (ref 70–99)
Glucose-Capillary: 328 mg/dL — ABNORMAL HIGH (ref 70–99)

## 2019-07-02 LAB — FERRITIN: Ferritin: 587 ng/mL — ABNORMAL HIGH (ref 24–336)

## 2019-07-02 LAB — PROCALCITONIN: Procalcitonin: 0.81 ng/mL

## 2019-07-02 LAB — C-REACTIVE PROTEIN: CRP: 21.3 mg/dL — ABNORMAL HIGH (ref <1.0)

## 2019-07-02 LAB — D-DIMER, QUANTITATIVE: D-Dimer, Quant: >20 ug/mL-FEU — ABNORMAL HIGH (ref 0.00–0.50)

## 2019-07-02 MED ORDER — INSULIN ASPART 100 UNIT/ML ~~LOC~~ SOLN
6.0000 [IU] | Freq: Three times a day (TID) | SUBCUTANEOUS | Status: DC
  Administered 2019-07-02 – 2019-07-03 (×3): 6 [IU] via SUBCUTANEOUS

## 2019-07-02 MED ORDER — LATANOPROST 0.005 % OP SOLN
1.0000 [drp] | Freq: Every day | OPHTHALMIC | Status: DC
  Administered 2019-07-02 – 2019-07-20 (×19): 1 [drp] via OPHTHALMIC
  Filled 2019-07-02 (×2): qty 2.5

## 2019-07-02 MED ORDER — GABAPENTIN 300 MG PO CAPS
300.0000 mg | ORAL_CAPSULE | Freq: Every day | ORAL | Status: DC
  Administered 2019-07-02 – 2019-07-18 (×17): 300 mg via ORAL
  Filled 2019-07-02 (×19): qty 1

## 2019-07-02 MED ORDER — INSULIN GLARGINE 100 UNIT/ML ~~LOC~~ SOLN
60.0000 [IU] | Freq: Two times a day (BID) | SUBCUTANEOUS | Status: DC
  Administered 2019-07-02 – 2019-07-03 (×3): 60 [IU] via SUBCUTANEOUS
  Filled 2019-07-02 (×5): qty 0.6

## 2019-07-02 MED ORDER — INSULIN GLARGINE 100 UNIT/ML ~~LOC~~ SOLN: 60.0000 [IU] | Freq: Every day | SUBCUTANEOUS | Status: DC

## 2019-07-02 MED ORDER — CYCLOSPORINE 0.05 % OP EMUL
1.0000 [drp] | Freq: Every day | OPHTHALMIC | Status: DC
  Administered 2019-07-02 – 2019-07-21 (×20): 1 [drp] via OPHTHALMIC
  Filled 2019-07-02 (×21): qty 30

## 2019-07-02 NOTE — Progress Notes (Addendum)
PROGRESS NOTE    [name]  [id number] DOB: [birth year] DOA: 07/02/2019 PCP: Aldine Contes, MD   Brief Narrative:  68 yo with T2DM c/b peripheral neuropathy, OSA, morbid obesity, CKD III, atrial fibrillation, hypertension, chronic pain, anemia, and multiple other medical problems who presented on 12/15 with 1 week of SOB, difficulty sleeping, poor PO intake, and presyncopal symptoms.  He was admitted from the internal medicine clinic.  He's received steroids and remdesivir.  Plan for plasma today.  Assessment & Plan:   Principal Problem:   COVID-19 with multiple comorbidities Active Problems:   Diabetes type 2, uncontrolled ([facility])   Morbid obesity ([place])   Hypertension   Paroxysmal atrial fibrillation with RVR ([facility])   Respiratory failure with hypoxia ([facility])   Pneumonia due to COVID-19 virus  Acute Hypoxic Respiratory Failure 2/2 COVID 19 Pneumonia:  Gradually improving, on 25 L HHFNC when I saw him, but per chart review has since been weaned to 15 L HFNC Continue steroids (day 5 steroids) and remdesivir (s/p 5 days) Convalescent plasma 12/19 Elevated procalcitonin on presentation.  Will hold off on actemra.  He seems to be improving.   I/O, daily weights - consider diuresis, though he's improving and creatinine not back to baseline yet Prone as able, IS Inflammatory markers elevated, relatively stable   COVID-19 Labs  Recent Labs    06/30/19 0530 07/01/19 0636 07/02/19 0445  DDIMER >20.00* >20.00* >20.00*  FERRITIN 668* 541* 587*  CRP 12.4* 23.6* 21.3*    Lab Results  Component Value Date   SARSCOV2NAA POSITIVE (Markea Ruzich) 06/20/2019   [place] NEGATIVE 01/24/2019   Elevated D dimer: while on eliquis, he did miss some days while he was sick.  Transition to full dose lovenox and follow LE [place] (pending) and echo (normal RV systolic function, moderately elevated PASP, low normal EF - see report).    Atrial Fibrillation with RVR: continue metoprolol PO with IV  metop prn for sustained HR over 120.  Follow EKG (NSR).  Anticoagulation as noted above.  AKI on CKD III: baseline creatinine 1.5, presented with creatinine of 3.  Improving.  Follow.  ACE on hold.  Elevated LFT's: mild, likely 2/2 above.  Slight increase, follow.  Acute hepatitis panel.    Type 2 DM  Diabetic Neuropathy:  He's on U500 insulin at home, gets this via [employer] pump.  Says he uses about 70 units of U500 daily (which is similar to what the med rec says) -> that's more like ~350 units of U100 insulin.  Will increase lantus to 60 units BID and follow closely.  Mealtime insulin.  Tradjenta.  Adjust as needed.   Gabapentin for neuropathy  HTN  [name]: continue metop.  Ace on hold.  Continue atorva.  OSA: CPAP on hold given COVID 19 infection  Insomnia: trazodone prn qhs  Thrombocytopenia: follow on anticoagulation, possibly related to viral illness  Glaucoma: continue home eye drops  DVT prophylaxis: lovenox Code Status: full Family Communication: none at bedside - discussed with wife 12/19, 12/20 Disposition Plan: pending   Consultants:   PCCM  Procedures:  Echo IMPRESSIONS    1. Left ventricular ejection fraction, by visual estimation, is 50 to 55%. The left ventricle has low normal function. There is mildly increased left ventricular hypertrophy.  2. The left ventricle has no regional wall motion abnormalities.  3. Global right ventricle has normal systolic function.The right ventricular size is not well visualized. No increase in right ventricular wall thickness.  4. Left atrial size  was normal.  5. Right atrial size was normal.  6. Small pericardial effusion.  7. The pericardial effusion is circumferential.  8. The mitral valve is normal in structure. Trivial mitral valve regurgitation.  9. The tricuspid valve is normal in structure. Tricuspid valve regurgitation is trivial. 10. The aortic valve is tricuspid. Aortic valve regurgitation is not visualized. No evidence  of aortic valve sclerosis or stenosis. 11. The pulmonic valve was grossly normal. Pulmonic valve regurgitation is trivial. 12. Moderately elevated pulmonary artery systolic pressure. 13. The atrial septum is grossly normal.   Antimicrobials:  Anti-infectives (From admission, onward)   Start     Dose/Rate Route Frequency Ordered Stop   06/28/19 2100  remdesivir 100 mg in sodium chloride 0.9 % 100 mL IVPB     100 mg 200 mL/hr over 30 Minutes Intravenous Daily 06/23/2019 2050 07/01/19 2107   07/02/2019 2100  remdesivir 200 mg in sodium chloride 0.9% 250 mL IVPB     200 mg 580 mL/hr over 30 Minutes Intravenous Once 06/17/2019 2050 06/28/19 0823     Subjective: No new complaints C/o chronic pain  Objective: Vitals:   07/02/19 0800 07/02/19 0900 07/02/19 0935 07/02/19 1000  BP: (!) 141/76 (!) 160/71 (!) 131/109 (!) 158/95  Pulse: 84 85 87 81  Resp: (!) 24 (!) 26 (!) 26 (!) 24  Temp: 98.3 F (36.8 C)     TempSrc: Oral     SpO2: 93% 92% 98% 100%  Weight:      Height:        Intake/Output Summary (Last 24 hours) at 07/02/2019 1133 Last data filed at 07/02/2019 1000 Gross per 24 hour  Intake 745.83 ml  Output 2750 ml  Net -2004.17 ml   Filed Weights   06/28/19 0443 06/30/19 1900  Weight: (!) 182.4 kg (!) 182 kg    Examination:  General: No acute distress. Cardiovascular: RRR Lungs: unlabored, distant breath sounds, no adventitious lung sounds Abdomen: Soft, nontender, nondistended Neurological: Alert and oriented 3. Moves all extremities 4. Cranial nerves II through XII grossly intact. Skin: Warm and dry. No rashes or lesions. Extremities: No clubbing or cyanosis. Trace edema. L foot with amputation.   Data Reviewed: I have personally reviewed following labs and imaging studies  CBC: Recent Labs  Lab 06/28/19 0423 06/29/19 0457 06/30/19 0530 07/01/19 0636 07/02/19 0445  WBC 4.4 2.9* 6.4 7.5 8.7  NEUTROABS 3.9 2.7 5.6 6.7 7.9*  HGB 10.8* 17.6* 12.2* 11.4* 11.3*   HCT 33.2* 54.4* 38.7* 36.5* 35.8*  MCV 87.8 87.9 89.4 88.8 88.4  PLT 124* 121* 108* 83* 95*   Basic Metabolic Panel: Recent Labs  Lab 06/28/19 0423 06/29/19 0457 06/30/19 0530 07/01/19 0636 07/02/19 0445  NA 138 139 138 139 136  K 4.0 4.2 3.8 4.4 4.2  CL 106 105 102 104 101  CO2 20* 23 23 22 23   GLUCOSE 205* 185* 210* 290* 384*  BUN 41* 41* 45* 49* 52*  CREATININE 2.67* 2.17* 2.13* 1.92* 1.78*  CALCIUM 7.1* 7.8* 7.7* 7.5* 7.7*  MG  --   --   --  2.0  --    GFR: Estimated Creatinine Clearance: 70.7 mL/min (Ha Place[place]) (by C-G formula based on SCr of 1.78 mg/dL (H)). Liver Function Tests: Recent Labs  Lab 06/28/19 0423 06/29/19 0457 06/30/19 0530 07/01/19 0636 07/02/19 0445  AST 142* 135* 86* 59* 50*  ALT 63* 73* 62* 49* 44  ALKPHOS 70 87 106 127* 155*  BILITOT 1.1 0.8 1.1 1.3* 1.7*  PROT 6.4* 6.2* 6.3* 6.0* 6.1*  ALBUMIN 2.3* 2.3* 2.3* 2.2* 2.3*   No results for input(s): LIPASE, AMYLASE in the last 168 hours. No results for input(s): AMMONIA in the last 168 hours. Coagulation Profile: No results for input(s): INR, PROTIME in the last 168 hours. Cardiac Enzymes: No results for input(s): CKTOTAL, CKMB, CKMBINDEX, TROPONINI in the last 168 hours. BNP (last 3 results) No results for input(s): PROBNP in the last 8760 hours. HbA1C: No results for input(s): HGBA1C in the last 72 hours. CBG: Recent Labs  Lab 07/01/19 1208 07/01/19 1623 07/01/19 1805 07/01/19 2154 07/02/19 0720  GLUCAP 255* 237* 258* 343* 376*   Lipid Profile: No results for input(s): CHOL, HDL, LDLCALC, TRIG, CHOLHDL, LDLDIRECT in the last 72 hours. Thyroid Function Tests: No results for input(s): TSH, T4TOTAL, FREET4, T3FREE, THYROIDAB in the last 72 hours. Anemia Panel: Recent Labs    07/01/19 0636 07/02/19 0445  FERRITIN 541* 587*   Sepsis Labs: Recent Labs  Lab 06/17/2019 1428 07/01/2019 2030 07/01/19 0636 07/02/19 0445  PROCALCITON  --  1.40 0.86 0.81  LATICACIDVEN 1.7  --   --   --      Recent Results (from the past 240 hour(s))  Respiratory Panel by RT PCR (Flu Arnika Larzelere&B, Covid) -     Status: Abnormal   Collection Time: 07/02/2019  1:50 PM  Result Value Ref Range Status   SARS Coronavirus 2 by RT PCR POSITIVE (Azra Abrell) NEGATIVE Final    Comment: RESULT CALLED TO, READ BACK BY AND VERIFIED WITH: O AGYEI MD 07/07/2019 1807 JDW (NOTE) SARS-CoV-2 target nucleic acids are DETECTED. SARS-CoV-2 RNA is generally detectable in upper respiratory specimens  during the acute phase of infection. Positive results are indicative of the presence of the identified virus, but do not rule out bacterial infection or co-infection with other pathogens not detected by the test. Clinical correlation with patient history and other diagnostic information is necessary to determine patient infection status. The expected result is Negative. Fact Sheet for Patients:  PinkCheek.be Fact Sheet for Healthcare Providers: GravelBags.it This test is not yet approved or cleared by the [place] FDA and  has been authorized for detection and/or diagnosis of SARS-CoV-2 by FDA under an Emergency Use Authorization (EUA).  This EUA will remain in effect (meaning this test can be used) for the  duration of  the COVID-19 declaration under Section 564(b)(1) of the Act, 21 U.S.C. section 360bbb-3(b)(1), unless the authorization is terminated or revoked sooner.    Influenza Jeraldin Fesler by PCR NEGATIVE NEGATIVE Final   Influenza B by PCR NEGATIVE NEGATIVE Final    Comment: (NOTE) The Xpert Xpress SARS-CoV-2/FLU/RSV assay is intended as an aid in  the diagnosis of influenza from Nasopharyngeal swab specimens and  should not be used as Buell Parcel sole basis for treatment. Nasal washings and  aspirates are unacceptable for Xpert Xpress SARS-CoV-2/FLU/RSV  testing. Fact Sheet for Patients: PinkCheek.be Fact Sheet for Healthcare  Providers: GravelBags.it This test is not yet approved or cleared by the [place] FDA and  has been authorized for detection and/or diagnosis of SARS-CoV-2 by  FDA under an Emergency Use Authorization (EUA). This EUA will remain  in effect (meaning this test can be used) for the duration of the  Covid-19 declaration under Section 564(b)(1) of the Act, 21  U.S.C. section 360bbb-3(b)(1), unless the authorization is  terminated or revoked. Performed at [facility], [place] [address], [place], [place] [address]   MRSA PCR Screening  Status: None   Collection Time: 06/28/19  1:35 PM   Specimen: Nasopharyngeal  Result Value Ref Range Status   MRSA by PCR NEGATIVE NEGATIVE Final    Comment:        The GeneXpert MRSA Assay (FDA approved for NASAL specimens only), is one component of Derrico Zhong comprehensive MRSA colonization surveillance program. It is not intended to diagnose MRSA infection nor to guide or monitor treatment for MRSA infections. Performed at [facility], [place] [address], [place], [place] [address]          Radiology Studies: ECHOCARDIOGRAM COMPLETE  Result Date: 07/01/2019   ECHOCARDIOGRAM REPORT   Patient Name:   [name] Date of Exam: 07/01/2019 Medical Rec #:  [id number]          Height:       78.0 in Accession #:    [id number]         Weight:       401.2 lb Date of Birth:  [birth year]          BSA:          3.03 m Patient Age:    61 years           BP:           144/73 mmHg Patient Gender: M                  HR:           76 bpm. Exam Location:  Inpatient Procedure: 2D Echo Indications:    positive D dimer  History:        Patient has prior history of Echocardiogram examinations, most                 recent 12/14/2014. Pneumonia due to Covid., Arrythmias:Atrial                 Fibrillation; Risk Factors:Diabetes, Hypertension and Sleep                 Apnea.  Sonographer:    Johny Chess Referring Phys: [phone] Camaria Gerald  Miyazaki POWELL JR  Sonographer Comments: Image acquisition challenging due to respiratory motion. IMPRESSIONS  1. Left ventricular ejection fraction, by visual estimation, is 50 to 55%. The left ventricle has low normal function. There is mildly increased left ventricular hypertrophy.  2. The left ventricle has no regional wall motion abnormalities.  3. Global right ventricle has normal systolic function.The right ventricular size is not well visualized. No increase in right ventricular wall thickness.  4. Left atrial size was normal.  5. Right atrial size was normal.  6. Small pericardial effusion.  7. The pericardial effusion is circumferential.  8. The mitral valve is normal in structure. Trivial mitral valve regurgitation.  9. The tricuspid valve is normal in structure. Tricuspid valve regurgitation is trivial. 10. The aortic valve is tricuspid. Aortic valve regurgitation is not visualized. No evidence of aortic valve sclerosis or stenosis. 11. The pulmonic valve was grossly normal. Pulmonic valve regurgitation is trivial. 12. Moderately elevated pulmonary artery systolic pressure. 13. The atrial septum is grossly normal. FINDINGS  Left Ventricle: Left ventricular ejection fraction, by visual estimation, is 50 to 55%. The left ventricle has low normal function. The left ventricle has no regional wall motion abnormalities. There is mildly increased left ventricular hypertrophy. Concentric left ventricular hypertrophy. Right Ventricle: The right ventricular size is not well visualized. No increase in right ventricular wall thickness. Global RV systolic function  is has normal systolic function. The tricuspid regurgitant velocity is 3.23 m/s, and with an assumed right atrial pressure of 3 mmHg, the estimated right ventricular systolic pressure is moderately elevated at 44.7 mmHg. Left Atrium: Left atrial size was normal in size. Right Atrium: Right atrial size was normal in size Pericardium: Avri Paiva small pericardial  effusion is present. The pericardial effusion is circumferential. Mitral Valve: The mitral valve is normal in structure. Trivial mitral valve regurgitation. Tricuspid Valve: The tricuspid valve is normal in structure. Tricuspid valve regurgitation is trivial. Aortic Valve: The aortic valve is tricuspid. Aortic valve regurgitation is not visualized. The aortic valve is structurally normal, with no evidence of sclerosis or stenosis. Pulmonic Valve: The pulmonic valve was grossly normal. Pulmonic valve regurgitation is trivial. Pulmonic regurgitation is trivial. Aorta: The aortic root and ascending aorta are structurally normal, with no evidence of dilitation. IAS/Shunts: The atrial septum is grossly normal.  LEFT VENTRICLE PLAX 2D LVIDd:         5.37 cm  Diastology LVIDs:         3.94 cm  LV e' lateral:   7.83 cm/s LV PW:         1.28 cm  LV E/e' lateral: 15.7 LV IVS:        1.30 cm  LV e' medial:    6.09 cm/s LVOT diam:     2.30 cm  LV E/e' medial:  20.2 LV SV:         72 ml LV SV Index:   22.20 LVOT Area:     4.15 cm  RIGHT VENTRICLE RV S prime:     10.20 cm/s TAPSE (M-mode): 2.5 cm LEFT ATRIUM           Index       RIGHT ATRIUM           Index LA diam:      4.80 cm 1.58 cm/m  RA Area:     17.70 cm LA Vol (A4C): 73.5 ml 24.22 ml/m RA Volume:   43.90 ml  14.47 ml/m  AORTIC VALVE LVOT Vmax:   75.60 cm/s LVOT Vmean:  49.500 cm/s LVOT VTI:    0.140 m MITRAL VALVE                         TRICUSPID VALVE MV Area (PHT): 4.21 cm              TR Peak grad:   41.7 mmHg MV PHT:        52.20 msec            TR Vmax:        323.00 cm/s MV Decel Time: 180 msec MV E velocity: 123.00 cm/s 103 cm/s  SHUNTS MV Divina Neale velocity: 95.90 cm/s  70.3 cm/s Systemic VTI:  0.14 m MV E/Aradhana Gin ratio:  1.28        1.5       Systemic Diam: 2.30 cm  Buford Dresser MD Electronically signed by Buford Dresser MD Signature Date/Time: 07/01/2019/3:51:32 PM    Final         Scheduled Meds: . atorvastatin  20 mg Oral q1800  .  Chlorhexidine Gluconate Cloth  6 each Topical Daily  . cycloSPORINE  1 drop Both Eyes Daily  . enoxaparin (LOVENOX) injection  1 mg/kg Subcutaneous Q12H  . gabapentin  300 mg Oral QHS  . insulin aspart  0-20 Units Subcutaneous TID WC  . insulin aspart  0-5 Units Subcutaneous QHS  . insulin aspart  6 Units Subcutaneous TID WC  . insulin glargine  60 Units Subcutaneous QHS  . latanoprost  1 drop Both Eyes QHS  . linagliptin  5 mg Oral Daily  . mouth rinse  15 mL Mouth Rinse BID  . methylPREDNISolone (SOLU-MEDROL) injection  60 mg Intravenous Q8H  . metoprolol tartrate  12.5 mg Oral Q6H  . pantoprazole  40 mg Oral Daily  . sodium chloride flush  3 mL Intravenous Q12H   Continuous Infusions: . sodium chloride Stopped (07/01/19 0355)     LOS: 5 days    Time spent: over 30 min.    Fayrene Helper, MD [facility] Pager AMION  If 7PM-7AM, please contact night-coverage [email or web address] Password [facility] 07/02/2019, 11:33 AM

## 2019-07-02 NOTE — Progress Notes (Signed)
Report given to [place], Therapist, sports. Pt going to room 121.

## 2019-07-02 NOTE — Progress Notes (Signed)
Patient seen and assessed. Patient found resting in bed watching late NFL game. Assessment completed per computerized charting and [facility] policy. Inquiry made r/t pain management. Patient discloses to nursing staff that he suffered a fall prior to admission at home. Will follow up with prn pain medication if available. Side rails up times 4. Bed in lowest position and locked. Call light in reach. No family present at bedside

## 2019-07-02 NOTE — Progress Notes (Signed)
Pt wife updated and all questions answered.  

## 2019-07-02 NOTE — Progress Notes (Signed)
Pt transferred to 121 with all belongings. RN present at time of transfer.

## 2019-07-02 NOTE — Progress Notes (Signed)
VASCULAR LAB PRELIMINARY  PRELIMINARY  PRELIMINARY  PRELIMINARY  Bilateral lower extremity venous duplex completed.    Preliminary report:  See CV proc for preliminary results.   Majorie Santee, RVT 07/02/2019, 4:25 PM

## 2019-07-03 ENCOUNTER — Ambulatory Visit: Payer: Medicare Other | Admitting: Endocrinology

## 2019-07-03 LAB — GLUCOSE, CAPILLARY
Glucose-Capillary: 303 mg/dL — ABNORMAL HIGH (ref 70–99)
Glucose-Capillary: 294 mg/dL — ABNORMAL HIGH (ref 70–99)
Glucose-Capillary: 297 mg/dL — ABNORMAL HIGH (ref 70–99)
Glucose-Capillary: 304 mg/dL — ABNORMAL HIGH (ref 70–99)
Glucose-Capillary: 267 mg/dL — ABNORMAL HIGH (ref 70–99)
Glucose-Capillary: 252 mg/dL — ABNORMAL HIGH (ref 70–99)

## 2019-07-03 LAB — COMPREHENSIVE METABOLIC PANEL
ALT: 38 U/L (ref 0–44)
AST: 40 U/L (ref 15–41)
Albumin: 2.3 g/dL — ABNORMAL LOW (ref 3.5–5.0)
Alkaline Phosphatase: 165 U/L — ABNORMAL HIGH (ref 38–126)
Anion gap: 13 (ref 5–15)
BUN: 62 mg/dL — ABNORMAL HIGH (ref 8–23)
CO2: 23 mmol/L (ref 22–32)
Calcium: 8.1 mg/dL — ABNORMAL LOW (ref 8.9–10.3)
Chloride: 101 mmol/L (ref 98–111)
Creatinine, Ser: 1.96 mg/dL — ABNORMAL HIGH (ref 0.61–1.24)
GFR calc Af Amer: 39 mL/min — ABNORMAL LOW (ref >60)
GFR calc non Af Amer: 34 mL/min — ABNORMAL LOW (ref >60)
Glucose, Bld: 322 mg/dL — ABNORMAL HIGH (ref 70–99)
Potassium: 4.3 mmol/L (ref 3.5–5.1)
Sodium: 137 mmol/L (ref 135–145)
Total Bilirubin: 1.3 mg/dL — ABNORMAL HIGH (ref 0.3–1.2)
Total Protein: 6.3 g/dL — ABNORMAL LOW (ref 6.5–8.1)

## 2019-07-03 LAB — D-DIMER, QUANTITATIVE: D-Dimer, Quant: >20 ug/mL-FEU — ABNORMAL HIGH (ref 0.00–0.50)

## 2019-07-03 LAB — CBC WITH DIFFERENTIAL/PLATELET
Abs Immature Granulocytes: 0.47 10*3/uL — ABNORMAL HIGH (ref 0.00–0.07)
Basophils Absolute: 0 10*3/uL (ref 0.0–0.1)
Basophils Relative: 0 %
Eosinophils Absolute: 0 10*3/uL (ref 0.0–0.5)
Eosinophils Relative: 0 %
HCT: 37.2 % — ABNORMAL LOW (ref 39.0–52.0)
Hemoglobin: 11.6 g/dL — ABNORMAL LOW (ref 13.0–17.0)
Immature Granulocytes: 4 %
Lymphocytes Relative: 6 %
Lymphs Abs: 0.6 10*3/uL — ABNORMAL LOW (ref 0.7–4.0)
MCH: 28 pg (ref 26.0–34.0)
MCHC: 31.2 g/dL (ref 30.0–36.0)
MCV: 89.6 fL (ref 80.0–100.0)
Monocytes Absolute: 0.4 10*3/uL (ref 0.1–1.0)
Monocytes Relative: 3 %
Neutro Abs: 9.8 10*3/uL — ABNORMAL HIGH (ref 1.7–7.7)
Neutrophils Relative %: 87 %
Platelets: 144 10*3/uL — ABNORMAL LOW (ref 150–400)
RBC: 4.15 MIL/uL — ABNORMAL LOW (ref 4.22–5.81)
RDW: 14.9 % (ref 11.5–15.5)
WBC: 11.3 10*3/uL — ABNORMAL HIGH (ref 4.0–10.5)
nRBC: 0.4 % — ABNORMAL HIGH (ref 0.0–0.2)

## 2019-07-03 LAB — PROCALCITONIN: Procalcitonin: 0.65 ng/mL

## 2019-07-03 LAB — HEPATITIS PANEL, ACUTE
HCV Ab: NONREACTIVE
Hep A IgM: NONREACTIVE
Hep B C IgM: NONREACTIVE
Hepatitis B Surface Ag: NONREACTIVE

## 2019-07-03 LAB — BRAIN NATRIURETIC PEPTIDE: B Natriuretic Peptide: 191.3 pg/mL — ABNORMAL HIGH (ref 0.0–100.0)

## 2019-07-03 LAB — MAGNESIUM: Magnesium: 2.3 mg/dL (ref 1.7–2.4)

## 2019-07-03 LAB — FERRITIN: Ferritin: 506 ng/mL — ABNORMAL HIGH (ref 24–336)

## 2019-07-03 LAB — C-REACTIVE PROTEIN: CRP: 11.4 mg/dL — ABNORMAL HIGH (ref <1.0)

## 2019-07-03 MED ORDER — INSULIN GLARGINE 100 UNIT/ML ~~LOC~~ SOLN
70.0000 [IU] | Freq: Two times a day (BID) | SUBCUTANEOUS | Status: DC
  Administered 2019-07-03: 70 [IU] via SUBCUTANEOUS
  Filled 2019-07-03 (×2): qty 0.7

## 2019-07-03 MED ORDER — INSULIN GLARGINE 100 UNIT/ML ~~LOC~~ SOLN
10.0000 [IU] | Freq: Once | SUBCUTANEOUS | Status: AC
  Administered 2019-07-03: 15:00:00 10 [IU] via SUBCUTANEOUS
  Filled 2019-07-03: qty 0.1

## 2019-07-03 MED ORDER — HYDROCODONE-ACETAMINOPHEN 5-325 MG PO TABS
1.5000 | ORAL_TABLET | ORAL | Status: DC | PRN
  Administered 2019-07-03 – 2019-07-19 (×41): 1.5 via ORAL
  Filled 2019-07-03 (×41): qty 2

## 2019-07-03 MED ORDER — INSULIN ASPART 100 UNIT/ML ~~LOC~~ SOLN
10.0000 [IU] | Freq: Three times a day (TID) | SUBCUTANEOUS | Status: DC
  Administered 2019-07-03 – 2019-07-05 (×6): 10 [IU] via SUBCUTANEOUS

## 2019-07-03 MED ORDER — DEXAMETHASONE SODIUM PHOSPHATE 10 MG/ML IJ SOLN
6.0000 mg | INTRAMUSCULAR | Status: AC
  Administered 2019-07-05 – 2019-07-06 (×2): 6 mg via INTRAVENOUS
  Filled 2019-07-03 (×2): qty 1

## 2019-07-03 NOTE — Plan of Care (Signed)

## 2019-07-03 NOTE — Care Management Important Message (Signed)
Important Message  Patient Details  Name: [name] MRN: [id number] Date of Birth: [birth year]   Medicare Important Message Given:  Yes - Important Message mailed due to current National Emergency  Verbal consent obtained due to current National Emergency  Relationship to patient: Spouse/Significant Other Contact Name: [name] Call Date: 07/03/19  Time: 1648 Phone: [phone] Outcome: Spoke with contact Important Message mailed to: Patient address on file    Delorse Lek 07/03/2019, 4:48 PM

## 2019-07-03 NOTE — Progress Notes (Signed)
On call provider notified to suggest switching prn pain meds from Q8hrs to Q4hrs.  Patient last had PO pain meds @ 2007. No other prn pain meds are currently available.

## 2019-07-03 NOTE — Progress Notes (Signed)
Inpatient Diabetes Program Recommendations  AACE/ADA: New Consensus Statement on Inpatient Glycemic Control (2015)  Target Ranges:  Prepandial:   less than 140 mg/dL      Peak postprandial:   less than 180 mg/dL (1-2 hours)      Critically ill patients:  140 - 180 mg/dL   Lab Results  Component Value Date   GLUCAP 304 (H) 07/03/2019   HGBA1C 8.2 (A) 06/06/2019    Review of Glycemic Control Results for TRYG, INDELICATO (MRN [id number]) as of 07/03/2019 12:01  Ref. Range 07/02/2019 07:20 07/02/2019 11:30 07/02/2019 17:14 07/02/2019 21:20 07/03/2019 08:52  Glucose-Capillary Latest Ref Range: 70 - 99 mg/dL 376 (H)  Novolog 20     Tradjenta 5 mg  Solumedrol 60 mg 343 (H)  Novolog 21 units  Lantus 60 units 294 (H)  Novolog 11 units       Solumedrol 60 mg given at 1500 328 (H)  Novolog 4 units       Solumedrol 60 mg given at 2123 297 (H)  Novolog 17 units  Lantus 60 units  Tradjenta 5 mg  Solumedrol 60 mg    Diabetes history: DM 2 Outpatient Diabetes medications: Humulin R U-500 draw up to the 68 units line on a U-100 insulin syringe  V-Go 20, Victoza 1.8 mg Daily  Current orders for Inpatient glycemic control:  Lantus 60 units bid Novolog 0-20 units tid + hs Novolog 6 units tid meal coverage Tradjenta 5 mg Daily  Solumedrol 60 mg Q8 hours A1c 8.2% on 11/24 BUN/Creat: 62/1.96   Inpatient Diabetes Program Recommendations:    -  Consider increasing Lantus to 70 units bid -  Consider increasing Novolog meal coverage to 10 units tid.  Thanks,  Tama Headings RN, MSN, BC-ADM Inpatient Diabetes Coordinator Team Pager [phone] (8a-5p)

## 2019-07-03 NOTE — Progress Notes (Addendum)
PROGRESS NOTE    [name]  [id number] DOB: Dec 27, 1950 DOA: 07/06/2019 PCP: Aldine Contes, MD   Brief Narrative:  68 yo with T2DM c/b peripheral neuropathy, OSA, morbid obesity, CKD III, atrial fibrillation, hypertension, chronic pain, anemia, and multiple other medical problems who presented on 12/15 with 1 week of SOB, difficulty sleeping, poor PO intake, and presyncopal symptoms.  He was admitted from the internal medicine clinic.  He's received steroids and remdesivir.  Plan for plasma today.  Assessment & Plan:   Principal Problem:   COVID-19 with multiple comorbidities Active Problems:   Diabetes type 2, uncontrolled ([place])   Morbid obesity ([place])   Hypertension   Paroxysmal atrial fibrillation with RVR (H[facility])   Respiratory failure with hypoxia ([facility])   Pneumonia due to COVID-19 virus  Acute Hypoxic Respiratory Failure 2/2 COVID 19 Pneumonia:  Gradually improving, on 25 L HHFNC when I saw him, but per chart review has since been weaned to 15 L HFNC Continue steroids (day 7 steroids) and remdesivir (s/p 5 days) Convalescent plasma 12/19 Elevated procalcitonin on presentation, so actemra was not given.  He's improving over last several days. I/O, daily weights - hold off on diuresis given kidney function Prone as able, IS Inflammatory markers elevated - CRP improved today.  D dimer remains elevated.  COVID-19 Labs  Recent Labs    07/01/19 0636 07/02/19 0445 07/03/19 0316  DDIMER >20.00* >20.00* >20.00*  FERRITIN 541* 587* 506*  CRP 23.6* 21.3* 11.4*    Lab Results  Component Value Date   SARSCOV2NAA POSITIVE ([place] [facility]) 06/25/2019   [place] NEGATIVE 01/24/2019   Elevated D dimer: while on eliquis, he did miss some days while he was sick.  Transition to full dose lovenox and follow LE [place] (pending) and echo (normal RV systolic function, moderately elevated PASP, low normal EF - see report).  D dimer remains significantly elevated. Will continue full dose  anticoagulation with lovenox for now.  Consider r/o PE once resp status improves, though unfortunately kidney function at the moment probably precludes contrast.    Atrial Fibrillation with RVR: continue metoprolol PO with IV metop prn for sustained HR over 120.  Follow EKG (NSR).  Anticoagulation as noted above.  AKI on CKD III: baseline creatinine 1.5, presented with creatinine of 3.  Improving.  Follow.  ACE on hold.  Elevated LFT's: mild, likely 2/2 above.  Slight increase, follow.  Acute hepatitis panel.    Type 2 DM  Diabetic Neuropathy:  He's on U500 insulin at home, gets this via Salih Williamson pump.  Says he uses about 70 units of U500 daily (which is similar to what the med rec says) -> that's more like ~350 units of U100 insulin.   Will increase lantus to 70 units BID and follow closely.  Mealtime insulin.  Tradjenta.  Adjust as needed.   Gabapentin for neuropathy  HTN  [name]: continue metop.  Ace on hold.  Continue atorva.  OSA: CPAP on hold given COVID 19 infection  Insomnia: trazodone prn qhs  Thrombocytopenia: follow on anticoagulation, possibly related to viral illness  Glaucoma: continue home eye drops  DVT prophylaxis: lovenox Code Status: full Family Communication: none at bedside - discussed with wife 12/19, 12/20, 12/21 Disposition Plan: pending   Consultants:   PCCM  Procedures:  Echo IMPRESSIONS    1. Left ventricular ejection fraction, by visual estimation, is 50 to 55%. The left ventricle has low normal function. There is mildly increased left ventricular hypertrophy.  2. The left ventricle  has no regional wall motion abnormalities.  3. Global right ventricle has normal systolic function.The right ventricular size is not well visualized. No increase in right ventricular wall thickness.  4. Left atrial size was normal.  5. Right atrial size was normal.  6. Small pericardial effusion.  7. The pericardial effusion is circumferential.  8. The mitral valve is  normal in structure. Trivial mitral valve regurgitation.  9. The tricuspid valve is normal in structure. Tricuspid valve regurgitation is trivial. 10. The aortic valve is tricuspid. Aortic valve regurgitation is not visualized. No evidence of aortic valve sclerosis or stenosis. 11. The pulmonic valve was grossly normal. Pulmonic valve regurgitation is trivial. 12. Moderately elevated pulmonary artery systolic pressure. 13. The atrial septum is grossly normal.  LE [place]   Summary: Right: There is no evidence of deep vein thrombosis in the lower extremity. However, portions of this examination were limited- see technologist comments above. Left: There is no evidence of deep vein thrombosis in the lower extremity. However, portions of this examination were limited- see technologist comments above.   *See table(s) above for measurements and observations.  Antimicrobials:  Anti-infectives (From admission, onward)   Start     Dose/Rate Route Frequency Ordered Stop   06/28/19 2100  remdesivir 100 mg in sodium chloride 0.9 % 100 mL IVPB     100 mg 200 mL/hr over 30 Minutes Intravenous Daily 07/07/2019 2050 07/01/19 2107   06/20/2019 2100  remdesivir 200 mg in sodium chloride 0.9% 250 mL IVPB     200 mg 580 mL/hr over 30 Minutes Intravenous Once 06/21/2019 2050 06/28/19 0823     Subjective: C/o chronic pain, concerned about his appointment today No new complaints  Objective: Vitals:   07/03/19 0828 07/03/19 1000 07/03/19 1134 07/03/19 1212  BP: 116/67 118/63 (!) 165/88 (!) 136/109  Pulse: 74 65 72   Resp: (!) 30 17 20  (!) 22  Temp: 98.1 F (36.7 C)  97.8 F (36.6 C)   TempSrc: Oral  Oral   SpO2: 91% 91% (!) 87% (!) 87%  Weight:      Height:        Intake/Output Summary (Last 24 hours) at 07/03/2019 1330 Last data filed at 07/03/2019 0917 Gross per 24 hour  Intake 462 ml  Output 2300 ml  Net -1838 ml   Filed Weights   06/28/19 0443 06/30/19 1900  Weight: (!) 182.4 kg (!) 182 kg     Examination:  General: No acute distress. Cardiovascular: RRR Lungs: appears comfortable on NRB at the moment, clear lung sounds, but distant Abdomen: Soft, nontender, nondistended Neurological: Alert and oriented 3. Moves all extremities 4. Cranial nerves II through XII grossly intact. Skin: Warm and dry. No rashes or lesions. Extremities: No clubbing or cyanosis. No edema.   Data Reviewed: I have personally reviewed following labs and imaging studies  CBC: Recent Labs  Lab 06/29/19 0457 06/30/19 0530 07/01/19 0636 07/02/19 0445 07/03/19 0316  WBC 2.9* 6.4 7.5 8.7 11.3*  NEUTROABS 2.7 5.6 6.7 7.9* 9.8*  HGB 17.6* 12.2* 11.4* 11.3* 11.6*  HCT 54.4* 38.7* 36.5* 35.8* 37.2*  MCV 87.9 89.4 88.8 88.4 89.6  PLT 121* 108* 83* 95* 123456*   Basic Metabolic Panel: Recent Labs  Lab 06/29/19 0457 06/30/19 0530 07/01/19 0636 07/02/19 0445 07/03/19 0316  NA 139 138 139 136 137  K 4.2 3.8 4.4 4.2 4.3  CL 105 102 104 101 101  CO2 23 23 22 23 23   GLUCOSE 185* 210* 290* 384*  322*  BUN 41* 45* 49* 52* 62*  CREATININE 2.17* 2.13* 1.92* 1.78* 1.96*  CALCIUM 7.8* 7.7* 7.5* 7.7* 8.1*  MG  --   --  2.0  --  2.3   GFR: Estimated Creatinine Clearance: 64.2 mL/min (Geniya Fulgham) (by C-G formula based on SCr of 1.96 mg/dL (H)). Liver Function Tests: Recent Labs  Lab 06/29/19 0457 06/30/19 0530 07/01/19 0636 07/02/19 0445 07/03/19 0316  AST 135* 86* 59* 50* 40  ALT 73* 62* 49* 44 38  ALKPHOS 87 106 127* 155* 165*  BILITOT 0.8 1.1 1.3* 1.7* 1.3*  PROT 6.2* 6.3* 6.0* 6.1* 6.3*  ALBUMIN 2.3* 2.3* 2.2* 2.3* 2.3*   No results for input(s): LIPASE, AMYLASE in the last 168 hours. No results for input(s): AMMONIA in the last 168 hours. Coagulation Profile: No results for input(s): INR, PROTIME in the last 168 hours. Cardiac Enzymes: No results for input(s): CKTOTAL, CKMB, CKMBINDEX, TROPONINI in the last 168 hours. BNP (last 3 results) No results for input(s): PROBNP in the last 8760  hours. HbA1C: No results for input(s): HGBA1C in the last 72 hours. CBG: Recent Labs  Lab 07/02/19 1130 07/02/19 1714 07/02/19 2120 07/03/19 0852 07/03/19 1130  GLUCAP 343* 294* 328* 297* 304*   Lipid Profile: No results for input(s): CHOL, HDL, LDLCALC, TRIG, CHOLHDL, LDLDIRECT in the last 72 hours. Thyroid Function Tests: No results for input(s): TSH, T4TOTAL, FREET4, T3FREE, THYROIDAB in the last 72 hours. Anemia Panel: Recent Labs    07/02/19 0445 07/03/19 0316  FERRITIN 587* 506*   Sepsis Labs: Recent Labs  Lab 06/24/2019 1428 06/20/2019 2030 07/01/19 0636 07/02/19 0445 07/03/19 0316  PROCALCITON  --  1.40 0.86 0.81 0.65  LATICACIDVEN 1.7  --   --   --   --     Recent Results (from the past 240 hour(s))  Respiratory Panel by RT PCR (Flu Evalene Vath&B, Covid) -     Status: Abnormal   Collection Time: 07/06/2019  1:50 PM  Result Value Ref Range Status   SARS Coronavirus 2 by RT PCR POSITIVE (Isac Lincks) NEGATIVE Final    Comment: RESULT CALLED TO, READ BACK BY AND VERIFIED WITH: O AGYEI MD 07/11/2019 1807 JDW (NOTE) SARS-CoV-2 target nucleic acids are DETECTED. SARS-CoV-2 RNA is generally detectable in upper respiratory specimens  during the acute phase of infection. Positive results are indicative of the presence of the identified virus, but do not rule out bacterial infection or co-infection with other pathogens not detected by the test. Clinical correlation with patient history and other diagnostic information is necessary to determine patient infection status. The expected result is Negative. Fact Sheet for Patients:  PinkCheek.be Fact Sheet for Healthcare Providers: GravelBags.it This test is not yet approved or cleared by the [place] FDA and  has been authorized for detection and/or diagnosis of SARS-CoV-2 by FDA under an Emergency Use Authorization (EUA).  This EUA will remain in effect (meaning this test can be  used) for the  duration of  the COVID-19 declaration under Section 564(b)(1) of the Act, 21 U.S.C. section 360bbb-3(b)(1), unless the authorization is terminated or revoked sooner.    Influenza Michayla Mcneil by PCR NEGATIVE NEGATIVE Final   Influenza B by PCR NEGATIVE NEGATIVE Final    Comment: (NOTE) The Xpert Xpress SARS-CoV-2/FLU/RSV assay is intended as an aid in  the diagnosis of influenza from Nasopharyngeal swab specimens and  should not be used as Tarena Gockley sole basis for treatment. Nasal washings and  aspirates are unacceptable for Xpert Xpress SARS-CoV-2/FLU/RSV  testing. Fact Sheet for Patients: PinkCheek.be Fact Sheet for Healthcare Providers: GravelBags.it This test is not yet approved or cleared by the [place] FDA and  has been authorized for detection and/or diagnosis of SARS-CoV-2 by  FDA under an Emergency Use Authorization (EUA). This EUA will remain  in effect (meaning this test can be used) for the duration of the  Covid-19 declaration under Section 564(b)(1) of the Act, 21  U.S.C. section 360bbb-3(b)(1), unless the authorization is  terminated or revoked. Performed at [facility], [place] [address] [place], [place] [address]   MRSA PCR Screening     Status: None   Collection Time: 06/28/19  1:35 PM   Specimen: Nasopharyngeal  Result Value Ref Range Status   MRSA by PCR NEGATIVE NEGATIVE Final    Comment:        The GeneXpert MRSA Assay (FDA approved for NASAL specimens only), is one component of Yeng Perz comprehensive MRSA colonization surveillance program. It is not intended to diagnose MRSA infection nor to guide or monitor treatment for MRSA infections. Performed at [facility], [place] [address], [place], [place] [address]          Radiology Studies: ECHOCARDIOGRAM COMPLETE  Result Date: 07/01/2019   ECHOCARDIOGRAM REPORT   Patient Name:   DAMEER LAMON Date of Exam: 07/01/2019 Medical Rec  #:  [id number]          Height:       78.0 in Accession #:    [id number]         Weight:       401.2 lb Date of Birth:  06-27-1951          BSA:          3.03 m Patient Age:    37 years           BP:           144/73 mmHg Patient Gender: M                  HR:           76 bpm. Exam Location:  Inpatient Procedure: 2D Echo Indications:    positive D dimer  History:        Patient has prior history of Echocardiogram examinations, most                 recent 12/14/2014. Pneumonia due to Covid., Arrythmias:Atrial                 Fibrillation; Risk Factors:Diabetes, Hypertension and Sleep                 Apnea.  Sonographer:    Johny Chess Referring Phys: [phone] Shirlene Andaya Reamy POWELL JR  Sonographer Comments: Image acquisition challenging due to respiratory motion. IMPRESSIONS  1. Left ventricular ejection fraction, by visual estimation, is 50 to 55%. The left ventricle has low normal function. There is mildly increased left ventricular hypertrophy.  2. The left ventricle has no regional wall motion abnormalities.  3. Global right ventricle has normal systolic function.The right ventricular size is not well visualized. No increase in right ventricular wall thickness.  4. Left atrial size was normal.  5. Right atrial size was normal.  6. Small pericardial effusion.  7. The pericardial effusion is circumferential.  8. The mitral valve is normal in structure. Trivial mitral valve regurgitation.  9. The tricuspid valve is normal in structure. Tricuspid valve regurgitation is trivial. 10. The  aortic valve is tricuspid. Aortic valve regurgitation is not visualized. No evidence of aortic valve sclerosis or stenosis. 11. The pulmonic valve was grossly normal. Pulmonic valve regurgitation is trivial. 12. Moderately elevated pulmonary artery systolic pressure. 13. The atrial septum is grossly normal. FINDINGS  Left Ventricle: Left ventricular ejection fraction, by visual estimation, is 50 to 55%. The left ventricle has low normal  function. The left ventricle has no regional wall motion abnormalities. There is mildly increased left ventricular hypertrophy. Concentric left ventricular hypertrophy. Right Ventricle: The right ventricular size is not well visualized. No increase in right ventricular wall thickness. Global RV systolic function is has normal systolic function. The tricuspid regurgitant velocity is 3.23 m/s, and with an assumed right atrial pressure of 3 mmHg, the estimated right ventricular systolic pressure is moderately elevated at 44.7 mmHg. Left Atrium: Left atrial size was normal in size. Right Atrium: Right atrial size was normal in size Pericardium: Abelardo Seidner small pericardial effusion is present. The pericardial effusion is circumferential. Mitral Valve: The mitral valve is normal in structure. Trivial mitral valve regurgitation. Tricuspid Valve: The tricuspid valve is normal in structure. Tricuspid valve regurgitation is trivial. Aortic Valve: The aortic valve is tricuspid. Aortic valve regurgitation is not visualized. The aortic valve is structurally normal, with no evidence of sclerosis or stenosis. Pulmonic Valve: The pulmonic valve was grossly normal. Pulmonic valve regurgitation is trivial. Pulmonic regurgitation is trivial. Aorta: The aortic root and ascending aorta are structurally normal, with no evidence of dilitation. IAS/Shunts: The atrial septum is grossly normal.  LEFT VENTRICLE PLAX 2D LVIDd:         5.37 cm  Diastology LVIDs:         3.94 cm  LV e' lateral:   7.83 cm/s LV PW:         1.28 cm  LV E/e' lateral: 15.7 LV IVS:        1.30 cm  LV e' medial:    6.09 cm/s LVOT diam:     2.30 cm  LV E/e' medial:  20.2 LV SV:         72 ml LV SV Index:   22.20 LVOT Area:     4.15 cm  RIGHT VENTRICLE RV S prime:     10.20 cm/s TAPSE (M-mode): 2.5 cm LEFT ATRIUM           Index       RIGHT ATRIUM           Index LA diam:      4.80 cm 1.58 cm/m  RA Area:     17.70 cm LA Vol (A4C): 73.5 ml 24.22 ml/m RA Volume:   43.90 ml   14.47 ml/m  AORTIC VALVE LVOT Vmax:   75.60 cm/s LVOT Vmean:  49.500 cm/s LVOT VTI:    0.140 m MITRAL VALVE                         TRICUSPID VALVE MV Area (PHT): 4.21 cm              TR Peak grad:   41.7 mmHg MV PHT:        52.20 msec            TR Vmax:        323.00 cm/s MV Decel Time: 180 msec MV E velocity: 123.00 cm/s 103 cm/s  SHUNTS MV Christipher Rieger velocity: 95.90 cm/s  70.3 cm/s Systemic VTI:  0.14 m MV E/Kattie Santoyo ratio:  1.28        1.5       Systemic Diam: 2.30 cm  Buford Dresser MD Electronically signed by Buford Dresser MD Signature Date/Time: 07/01/2019/3:51:32 PM    Final    VAS [place] LOWER EXTREMITY VENOUS (DVT)  Result Date: 07/03/2019  Lower Venous Study Indications: Covid-19, Elevated D-Dimer.  Limitations: Body habitus. Comparison Study: Prior study done 03/17/18 Performing Technologist: Sharion Dove RVS  Examination Guidelines: Jevante Hollibaugh complete evaluation includes B-mode imaging, spectral Doppler, color Doppler, and power Doppler as needed of all accessible portions of each vessel. Bilateral testing is considered an integral part of Labrisha Wuellner complete examination. Limited examinations for reoccurring indications may be performed as noted.  +---------+---------------+---------+-----------+----------+-------------------+ RIGHT    CompressibilityPhasicitySpontaneityPropertiesThrombus Aging      +---------+---------------+---------+-----------+----------+-------------------+ CFV      Full           Yes      Yes                                      +---------+---------------+---------+-----------+----------+-------------------+ SFJ      Full                                                             +---------+---------------+---------+-----------+----------+-------------------+ FV Prox  Full                                                             +---------+---------------+---------+-----------+----------+-------------------+ FV Mid   Full                                                              +---------+---------------+---------+-----------+----------+-------------------+ FV DistalFull                                                             +---------+---------------+---------+-----------+----------+-------------------+ PFV      Full                                                             +---------+---------------+---------+-----------+----------+-------------------+ POP      Full           Yes      Yes                                      +---------+---------------+---------+-----------+----------+-------------------+ PTV  patent by color and                                                       Doppler             +---------+---------------+---------+-----------+----------+-------------------+ PERO                                                  Not visualized      +---------+---------------+---------+-----------+----------+-------------------+   +---------+---------------+---------+-----------+----------+-------------------+ LEFT     CompressibilityPhasicitySpontaneityPropertiesThrombus Aging      +---------+---------------+---------+-----------+----------+-------------------+ CFV                     Yes      Yes                  patent by color and                                                       Doppler             +---------+---------------+---------+-----------+----------+-------------------+ SFJ                                                   Not visualized      +---------+---------------+---------+-----------+----------+-------------------+ FV Prox  Full           Yes      Yes                                      +---------+---------------+---------+-----------+----------+-------------------+ FV Mid   Full                                                              +---------+---------------+---------+-----------+----------+-------------------+ FV DistalFull                                                             +---------+---------------+---------+-----------+----------+-------------------+ PFV      Full                                                             +---------+---------------+---------+-----------+----------+-------------------+ POP      Full           Yes  Yes                                      +---------+---------------+---------+-----------+----------+-------------------+ PTV      Full                                                             +---------+---------------+---------+-----------+----------+-------------------+ PERO                                                  Not visualized      +---------+---------------+---------+-----------+----------+-------------------+     Summary: Right: There is no evidence of deep vein thrombosis in the lower extremity. However, portions of this examination were limited- see technologist comments above. Left: There is no evidence of deep vein thrombosis in the lower extremity. However, portions of this examination were limited- see technologist comments above.  *See table(s) above for measurements and observations. Electronically signed by Harold Barban MD on 07/03/2019 at 8:23:44 AM.    Final         Scheduled Meds: . atorvastatin  20 mg Oral q1800  . Chlorhexidine Gluconate Cloth  6 each Topical Daily  . cycloSPORINE  1 drop Both Eyes Daily  . enoxaparin (LOVENOX) injection  1 mg/kg Subcutaneous Q12H  . gabapentin  300 mg Oral QHS  . insulin aspart  0-20 Units Subcutaneous TID WC  . insulin aspart  0-5 Units Subcutaneous QHS  . insulin aspart  10 Units Subcutaneous TID WC  . insulin glargine  10 Units Subcutaneous Once  . insulin glargine  70 Units Subcutaneous BID  . latanoprost  1 drop Both Eyes QHS  . linagliptin  5 mg Oral Daily  . mouth rinse   15 mL Mouth Rinse BID  . methylPREDNISolone (SOLU-MEDROL) injection  60 mg Intravenous Q8H  . metoprolol tartrate  12.5 mg Oral Q6H  . pantoprazole  40 mg Oral Daily  . sodium chloride flush  3 mL Intravenous Q12H   Continuous Infusions: . sodium chloride Stopped (07/01/19 0355)     LOS: 6 days    Time spent: over 30 min.    Fayrene Helper, MD [facility] Pager AMION  If 7PM-7AM, please contact night-coverage [email or web address] Password [email or web address] 07/03/2019, 1:30 PM

## 2019-07-03 NOTE — Progress Notes (Signed)
Late Entry for 07/02/19 @ 2214: Spoke with patient's spouse, Early [phone]). Update given on patient condition. Staff nurse makes inquiry related to dates toes were amputated. 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 Staff nurse also makes inquiry regarding if patient wears hearing aids. Spouse states that patient does not wear hearing aid, but is in need of them

## 2019-07-04 ENCOUNTER — Inpatient Hospital Stay (HOSPITAL_COMMUNITY): Payer: Medicare Other

## 2019-07-04 LAB — CBC WITH DIFFERENTIAL/PLATELET
Abs Immature Granulocytes: 0.66 10*3/uL — ABNORMAL HIGH (ref 0.00–0.07)
Basophils Absolute: 0.1 10*3/uL (ref 0.0–0.1)
Basophils Relative: 0 %
Eosinophils Absolute: 0 10*3/uL (ref 0.0–0.5)
Eosinophils Relative: 0 %
HCT: 38.5 % — ABNORMAL LOW (ref 39.0–52.0)
Hemoglobin: 12 g/dL — ABNORMAL LOW (ref 13.0–17.0)
Immature Granulocytes: 6 %
Lymphocytes Relative: 7 %
Lymphs Abs: 0.8 10*3/uL (ref 0.7–4.0)
MCH: 28.2 pg (ref 26.0–34.0)
MCHC: 31.2 g/dL (ref 30.0–36.0)
MCV: 90.4 fL (ref 80.0–100.0)
Monocytes Absolute: 0.5 10*3/uL (ref 0.1–1.0)
Monocytes Relative: 4 %
Neutro Abs: 9.2 10*3/uL — ABNORMAL HIGH (ref 1.7–7.7)
Neutrophils Relative %: 83 %
Platelets: 180 10*3/uL (ref 150–400)
RBC: 4.26 MIL/uL (ref 4.22–5.81)
RDW: 14.7 % (ref 11.5–15.5)
WBC: 11.2 10*3/uL — ABNORMAL HIGH (ref 4.0–10.5)
nRBC: 0.7 % — ABNORMAL HIGH (ref 0.0–0.2)

## 2019-07-04 LAB — GLUCOSE, CAPILLARY
Glucose-Capillary: 261 mg/dL — ABNORMAL HIGH (ref 70–99)
Glucose-Capillary: 223 mg/dL — ABNORMAL HIGH (ref 70–99)
Glucose-Capillary: 304 mg/dL — ABNORMAL HIGH (ref 70–99)
Glucose-Capillary: 248 mg/dL — ABNORMAL HIGH (ref 70–99)
Glucose-Capillary: 250 mg/dL — ABNORMAL HIGH (ref 70–99)

## 2019-07-04 LAB — COMPREHENSIVE METABOLIC PANEL
ALT: 34 U/L (ref 0–44)
AST: 36 U/L (ref 15–41)
Albumin: 2.4 g/dL — ABNORMAL LOW (ref 3.5–5.0)
Alkaline Phosphatase: 137 U/L — ABNORMAL HIGH (ref 38–126)
Anion gap: 11 (ref 5–15)
BUN: 52 mg/dL — ABNORMAL HIGH (ref 8–23)
CO2: 25 mmol/L (ref 22–32)
Calcium: 8.2 mg/dL — ABNORMAL LOW (ref 8.9–10.3)
Chloride: 105 mmol/L (ref 98–111)
Creatinine, Ser: 1.84 mg/dL — ABNORMAL HIGH (ref 0.61–1.24)
GFR calc Af Amer: 42 mL/min — ABNORMAL LOW (ref >60)
GFR calc non Af Amer: 37 mL/min — ABNORMAL LOW (ref >60)
Glucose, Bld: 249 mg/dL — ABNORMAL HIGH (ref 70–99)
Potassium: 4.6 mmol/L (ref 3.5–5.1)
Sodium: 141 mmol/L (ref 135–145)
Total Bilirubin: 1.4 mg/dL — ABNORMAL HIGH (ref 0.3–1.2)
Total Protein: 6 g/dL — ABNORMAL LOW (ref 6.5–8.1)

## 2019-07-04 LAB — MAGNESIUM: Magnesium: 2.4 mg/dL (ref 1.7–2.4)

## 2019-07-04 LAB — FERRITIN: Ferritin: 454 ng/mL — ABNORMAL HIGH (ref 24–336)

## 2019-07-04 LAB — D-DIMER, QUANTITATIVE: D-Dimer, Quant: 19.53 ug/mL-FEU — ABNORMAL HIGH (ref 0.00–0.50)

## 2019-07-04 LAB — C-REACTIVE PROTEIN: CRP: 5.2 mg/dL — ABNORMAL HIGH (ref <1.0)

## 2019-07-04 MED ORDER — INSULIN GLARGINE 100 UNIT/ML ~~LOC~~ SOLN
75.0000 [IU] | Freq: Two times a day (BID) | SUBCUTANEOUS | Status: DC
  Administered 2019-07-04 – 2019-07-06 (×6): 75 [IU] via SUBCUTANEOUS
  Filled 2019-07-04 (×8): qty 0.75

## 2019-07-04 MED ORDER — AMLODIPINE BESYLATE 5 MG PO TABS
5.0000 mg | ORAL_TABLET | Freq: Every day | ORAL | Status: DC
  Administered 2019-07-04 – 2019-07-05 (×2): 5 mg via ORAL
  Filled 2019-07-04 (×3): qty 1

## 2019-07-04 NOTE — Progress Notes (Addendum)
Occupational Therapy Evaluation Patient Details Name: [name] MRN: [id number] DOB: July 29, 1950 Today's Date: 07/04/2019    History of Present Illness 68 yo with T2DM c/b peripheral neuropathy, OSA, morbid obesity, CKD III, atrial fibrillation, hypertension, chronic pain, anemia, and multiple other medical problems who presented on 12/15 with 1 week of SOB, difficulty sleeping, poor PO intake, and presyncopal symptoms.  He was admitted from the internal medicine clinic to [facility] then transferred to [facility].    Clinical Impression   PTA, pt was at home with wife and states he used a cane for mobility and his wife asissted with bathing/dressin gon his "bad days". Pt appears frustrated with the inability to get to a BSC. Pt reports that he fell PTA and is having tremendous R hip pain. Pt desats with minimal bed mobility and when pulling self forward in bed with 3/4 DOE. Will further assess tomorrow - pt may need x-ray R hip.  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 Pt may benefit from rehab at SNF, however if that is not an option, he will benefit from rehab at [facility]. Will plan to mobilize OOB once larger recliner/BSC attained for pt. Pt verbalized understanding.   Follow Up Recommendations  CIR    Equipment Recommendations  3 in 1 bedside commode;Other (comment)(wide)    Recommendations for Other Services Rehab consult     Precautions / Restrictions Precautions Precautions: Fall Precaution Comments: painfful R hip; deats with minimal activity      Mobility Bed Mobility Overal bed mobility: Needs Assistance Bed Mobility: Rolling Rolling: Max assist         General bed mobility comments: Difficulty rolling due to hip pain  Transfers                 General transfer comment: Will need +2 assist    Balance                                           ADL either performed or assessed with  clinical judgement   ADL Overall ADL's : Needs assistance/impaired Eating/Feeding: Modified independent   Grooming: Set up;Sitting   Upper Body Bathing: Minimal assistance;Sitting   Lower Body Bathing: Maximal assistance;Bed level   Upper Body Dressing : Minimal assistance;Bed level   Lower Body Dressing: Maximal assistance;Bed level               Functional mobility during ADLs: (Will need +2 assistance)       Vision   Additional Comments: baseline vision deficits; will further assess     Perception     Praxis      Pertinent Vitals/Pain Pain Assessment: 0-10 Pain Score: 9  Pain Location: R hip Pain Descriptors / Indicators: Discomfort;Grimacing;Guarding;Moaning;Aching Pain Intervention(s): Limited activity within patient's tolerance;Repositioned     Hand Dominance Right   Extremity/Trunk Assessment Upper Extremity Assessment Upper Extremity Assessment: Generalized weakness   Lower Extremity Assessment Lower Extremity Assessment: Defer to PT evaluation(No toes L foot; pain R hip)   Cervical / Trunk Assessment Cervical / Trunk Assessment: Other exceptions(increased body habitus)   Communication     Cognition Arousal/Alertness: Awake/alert Behavior During Therapy: WFL for tasks assessed/performed(frustrated with situation) Overall Cognitive Status: No family/caregiver present to determine baseline cognitive functioning  General Comments: most likely baseline   General Comments       Exercises     Shoulder Instructions      Home Living Family/patient expects to be discharged to:: Private residence Living Arrangements: Spouse/significant other Available Help at Discharge: Family;Available 24 hours/day Type of Home: House Home Access: Stairs to enter     Home Layout: One level     Bathroom Shower/Tub: Occupational psychologist: Handicapped height Bathroom Accessibility: Yes How  Accessible: Accessible via walker Home Equipment: Cane - single point;Grab bars - tub/shower(4 canes)          Prior Functioning/Environment Level of Independence: Independent with assistive device(s);Needs assistance  Gait / Transfers Assistance Needed: used cane ADL's / Homemaking Assistance Needed: wife assisted with ADL on "bad days"            OT Problem List: Decreased strength;Decreased range of motion;Decreased activity tolerance;Impaired balance (sitting and/or standing);Decreased safety awareness;Decreased knowledge of use of DME or AE;Cardiopulmonary status limiting activity;Obesity;Pain      OT Treatment/Interventions: Self-care/ADL training;Therapeutic exercise;Neuromuscular education;Energy conservation;DME and/or AE instruction;Therapeutic activities;Cognitive remediation/compensation;Visual/perceptual remediation/compensation;Patient/family education;Balance training    OT Goals(Current goals can be found in the care plan section) Acute Rehab OT Goals Patient Stated Goal: to get stronger and go home OT Goal Formulation: With patient Time For Goal Achievement: 07/18/19 Potential to Achieve Goals: Good  OT Frequency: Min 2X/week   Barriers to D/C:            Co-evaluation              AM-PAC OT "6 Clicks" Daily Activity     Outcome Measure Help from another person eating meals?: None Help from another person taking care of personal grooming?: A Little Help from another person toileting, which includes using toliet, bedpan, or urinal?: A Lot Help from another person bathing (including washing, rinsing, drying)?: A Lot Help from another person to put on and taking off regular upper body clothing?: A Little Help from another person to put on and taking off regular lower body clothing?: A Lot 6 Click Score: 16   End of Session Equipment Utilized During Treatment: Oxygen Nurse Communication: Mobility status;Other (comment)(need for wide BSC)  Activity  Tolerance: Patient limited by pain Patient left: in bed;with call bell/phone within reach  OT Visit Diagnosis: Unsteadiness on feet (R26.81);Other abnormalities of gait and mobility (R26.89);History of falling (Z91.81);Muscle weakness (generalized) (M62.81);Other symptoms and signs involving cognitive function;Pain Pain - Right/Left: Right Pain - part of body: Hip                Time: 1730-1754 OT Time Calculation (min): 24 min Charges:  OT General Charges $OT Visit: 1 Visit OT Evaluation $OT Eval Moderate Complexity: 1 Mod OT Treatments $Self Care/Home Management : 8-22 mins  Maurie Boettcher, OT/L   Acute OT Clinical Specialist [place] Pager [phone] Office [phone]   [facility] 07/04/2019, 6:13 PM

## 2019-07-04 NOTE — Progress Notes (Addendum)
PROGRESS NOTE    [name]  [id number] DOB: [birth year] DOA: 07/04/2019 PCP: Aldine Contes, MD   Brief Narrative:  68 yo with T2DM c/b peripheral neuropathy, OSA, morbid obesity, CKD III, atrial fibrillation, hypertension, chronic pain, anemia, and multiple other medical problems who presented on 12/15 with 1 week of SOB, difficulty sleeping, poor PO intake, and presyncopal symptoms.  He was admitted from the internal medicine clinic.  He's received steroids and remdesivir.  Plan for plasma today.  Assessment & Plan:   Principal Problem:   COVID-19 with multiple comorbidities Active Problems:   Diabetes type 2, uncontrolled ([place])   Morbid obesity ([place])   Hypertension   Paroxysmal atrial fibrillation with RVR (HCC)   Respiratory failure with hypoxia (HCC)   Pneumonia due to COVID-19 virus  Acute Hypoxic Respiratory Failure 2/2 COVID 19 Pneumonia:  Gradually improving, continues on NRB today will wean O2 as tolerated Continue steroids (day 8 steroids) and remdesivir (s/p 5 days) Convalescent plasma 12/19 Elevated procalcitonin on presentation, so actemra was not given.  He's improving over last several days. I/O, daily weights - hold off on diuresis given kidney function Prone as able, IS Inflammatory markers elevated - CRP improved today.  D dimer remains elevated. CXR 12/22 with multifocal airspace opacity bilaterally   COVID-19 Labs  Recent Labs    07/02/19 0445 07/03/19 0316 07/04/19 0537  DDIMER >20.00* >20.00* 19.53*  FERRITIN 587* 506* 454*  CRP 21.3* 11.4* 5.2*    Lab Results  Component Value Date   SARSCOV2NAA POSITIVE (Myley Bahner) 07/10/2019   [place] NEGATIVE 01/24/2019   Elevated D dimer: while on eliquis, he did miss some days while he was sick.  Transition to full dose lovenox and follow LE [place] (pending) and echo (normal RV systolic function, moderately elevated PASP, low normal EF - see report).  D dimer remains significantly elevated. Will  continue full dose anticoagulation with lovenox for now.  Consider r/o PE once resp status improves, though unfortunately kidney function at the moment probably precludes contrast.    Atrial Fibrillation with RVR: continue metoprolol PO with IV metop prn for sustained HR over 120.  Follow EKG (NSR).  Anticoagulation as noted above.  AKI on CKD III: baseline creatinine 1.5, presented with creatinine of 3.  Fluctuating, increased to 1.96 yesterday, today 1.84.  Follow.  ACE on hold.  Elevated LFT's: Improved.  Acute hepatitis panel (negat9ve).    Type 2 DM  Diabetic Neuropathy:  He's on U500 insulin at home, gets this via Sabree Nuon pump.  Says he uses about 70 units of U500 daily (which is similar to what the med rec says) -> that's more like ~350 units of U100 insulin.   Will increase lantus to 75 units BID and follow closely.  Mealtime insulin.  Tradjenta.  Adjust as needed.   Gabapentin for neuropathy  HTN  [name]: continue metop.  Start amlodipine with elevated BP's.  Ace on hold as well as lasix due to kidney function.  Continue atorva.  OSA: CPAP on hold given COVID 19 infection  Insomnia: trazodone prn qhs  Thrombocytopenia: follow on anticoagulation, possibly related to viral illness.  Improved.  Glaucoma: continue home eye drops  DVT prophylaxis: lovenox Code Status: full Family Communication: none at bedside - discussed with wife 12/19, 12/20, 12/21, and 12/22 Disposition Plan: pending   Consultants:   PCCM  Procedures:  Echo IMPRESSIONS    1. Left ventricular ejection fraction, by visual estimation, is 50 to 55%. The left ventricle has  low normal function. There is mildly increased left ventricular hypertrophy.  2. The left ventricle has no regional wall motion abnormalities.  3. Global right ventricle has normal systolic function.The right ventricular size is not well visualized. No increase in right ventricular wall thickness.  4. Left atrial size was normal.  5. Right  atrial size was normal.  6. Small pericardial effusion.  7. The pericardial effusion is circumferential.  8. The mitral valve is normal in structure. Trivial mitral valve regurgitation.  9. The tricuspid valve is normal in structure. Tricuspid valve regurgitation is trivial. 10. The aortic valve is tricuspid. Aortic valve regurgitation is not visualized. No evidence of aortic valve sclerosis or stenosis. 11. The pulmonic valve was grossly normal. Pulmonic valve regurgitation is trivial. 12. Moderately elevated pulmonary artery systolic pressure. 13. The atrial septum is grossly normal.  LE [place]   Summary: Right: There is no evidence of deep vein thrombosis in the lower extremity. However, portions of this examination were limited- see technologist comments above. Left: There is no evidence of deep vein thrombosis in the lower extremity. However, portions of this examination were limited- see technologist comments above.   *See table(s) above for measurements and observations.  Antimicrobials:  Anti-infectives (From admission, onward)   Start     Dose/Rate Route Frequency Ordered Stop   06/28/19 2100  remdesivir 100 mg in sodium chloride 0.9 % 100 mL IVPB     100 mg 200 mL/hr over 30 Minutes Intravenous Daily 07/11/2019 2050 07/01/19 2107   06/23/2019 2100  remdesivir 200 mg in sodium chloride 0.9% 250 mL IVPB     200 mg 580 mL/hr over 30 Minutes Intravenous Once 06/15/2019 2050 06/28/19 0823     Subjective: No new complaints today Says he's feeling better  Objective: Vitals:   07/04/19 0509 07/04/19 0856 07/04/19 1010 07/04/19 1240  BP: (!) 167/73 (!) 143/101 (!) 174/92 (!) 180/157  Pulse: 75 66 73 72  Resp:  (!) 22 (!) 21 (!) 21  Temp:  (!) 97.4 F (36.3 C) (!) 97.5 F (36.4 C)   TempSrc:  Oral Oral   SpO2:  (!) 87% (!) 89% 98%  Weight:      Height:        Intake/Output Summary (Last 24 hours) at 07/04/2019 1311 Last data filed at 07/04/2019 1000 Gross per 24 hour  Intake  123 ml  Output 1450 ml  Net -1327 ml   Filed Weights   06/28/19 0443 06/30/19 1900  Weight: (!) 182.4 kg (!) 182 kg    Examination:  General: No acute distress. Cardiovascular: RRR Lungs: Clear to auscultation bilaterally with good air movement Abdomen: Soft, nontender, nondistended  Neurological: Alert and oriented 3. Moves all extremities 4. Cranial nerves II through XII grossly intact. Skin: Warm and dry. No rashes or lesions. Extremities: No clubbing or cyanosis. No edema.   Data Reviewed: I have personally reviewed following labs and imaging studies  CBC: Recent Labs  Lab 06/30/19 0530 07/01/19 0636 07/02/19 0445 07/03/19 0316 07/04/19 0537  WBC 6.4 7.5 8.7 11.3* 11.2*  NEUTROABS 5.6 6.7 7.9* 9.8* 9.2*  HGB 12.2* 11.4* 11.3* 11.6* 12.0*  HCT 38.7* 36.5* 35.8* 37.2* 38.5*  MCV 89.4 88.8 88.4 89.6 90.4  PLT 108* 83* 95* 144* [id number]   Basic Metabolic Panel: Recent Labs  Lab 06/30/19 0530 07/01/19 0636 07/02/19 0445 07/03/19 0316 07/04/19 0537  NA 138 139 136 137 141  K 3.8 4.4 4.2 4.3 4.6  CL 102 104 101 101  105  CO2 23 22 23 23 25   GLUCOSE 210* 290* 384* 322* 249*  BUN 45* 49* 52* 62* 52*  CREATININE 2.13* 1.92* 1.78* 1.96* 1.84*  CALCIUM 7.7* 7.5* 7.7* 8.1* 8.2*  MG  --  2.0  --  2.3 2.4   GFR: Estimated Creatinine Clearance: 68.4 mL/min (Gaetano Romberger) (by C-G formula based on SCr of 1.84 mg/dL (H)). Liver Function Tests: Recent Labs  Lab 06/30/19 0530 07/01/19 0636 07/02/19 0445 07/03/19 0316 07/04/19 0537  AST 86* 59* 50* 40 36  ALT 62* 49* 44 38 34  ALKPHOS 106 127* 155* 165* 137*  BILITOT 1.1 1.3* 1.7* 1.3* 1.4*  PROT 6.3* 6.0* 6.1* 6.3* 6.0*  ALBUMIN 2.3* 2.2* 2.3* 2.3* 2.4*   No results for input(s): LIPASE, AMYLASE in the last 168 hours. No results for input(s): AMMONIA in the last 168 hours. Coagulation Profile: No results for input(s): INR, PROTIME in the last 168 hours. Cardiac Enzymes: No results for input(s): CKTOTAL, CKMB, CKMBINDEX,  TROPONINI in the last 168 hours. BNP (last 3 results) No results for input(s): PROBNP in the last 8760 hours. HbA1C: No results for input(s): HGBA1C in the last 72 hours. CBG: Recent Labs  Lab 07/03/19 1656 07/03/19 2205 07/04/19 0508 07/04/19 0752 07/04/19 1206  GLUCAP 267* 252* 261* 223* 304*   Lipid Profile: No results for input(s): CHOL, HDL, LDLCALC, TRIG, CHOLHDL, LDLDIRECT in the last 72 hours. Thyroid Function Tests: No results for input(s): TSH, T4TOTAL, FREET4, T3FREE, THYROIDAB in the last 72 hours. Anemia Panel: Recent Labs    07/03/19 0316 07/04/19 0537  FERRITIN 506* 454*   Sepsis Labs: Recent Labs  Lab 06/17/2019 1428 07/10/2019 2030 07/01/19 0636 07/02/19 0445 07/03/19 0316  PROCALCITON  --  1.40 0.86 0.81 0.65  LATICACIDVEN 1.7  --   --   --   --     Recent Results (from the past 240 hour(s))  Respiratory Panel by RT PCR (Flu Deigo Alonso&B, Covid) -     Status: Abnormal   Collection Time: 07/03/2019  1:50 PM  Result Value Ref Range Status   SARS Coronavirus 2 by RT PCR POSITIVE (Saidah Kempton) NEGATIVE Final    Comment: RESULT CALLED TO, READ BACK BY AND VERIFIED WITH: O AGYEI MD 06/15/2019 1807 JDW (NOTE) SARS-CoV-2 target nucleic acids are DETECTED. SARS-CoV-2 RNA is generally detectable in upper respiratory specimens  during the acute phase of infection. Positive results are indicative of the presence of the identified virus, but do not rule out bacterial infection or co-infection with other pathogens not detected by the test. Clinical correlation with patient history and other diagnostic information is necessary to determine patient infection status. The expected result is Negative. Fact Sheet for Patients:  PinkCheek.be Fact Sheet for Healthcare Providers: GravelBags.it This test is not yet approved or cleared by the [place] FDA and  has been authorized for detection and/or diagnosis of SARS-CoV-2  by FDA under an Emergency Use Authorization (EUA).  This EUA will remain in effect (meaning this test can be used) for the  duration of  the COVID-19 declaration under Section 564(b)(1) of the Act, 21 U.S.C. section 360bbb-3(b)(1), unless the authorization is terminated or revoked sooner.    Influenza Alejah Aristizabal by PCR NEGATIVE NEGATIVE Final   Influenza B by PCR NEGATIVE NEGATIVE Final    Comment: (NOTE) The Xpert Xpress SARS-CoV-2/FLU/RSV assay is intended as an aid in  the diagnosis of influenza from Nasopharyngeal swab specimens and  should not be used as Valary Manahan sole basis for  treatment. Nasal washings and  aspirates are unacceptable for Xpert Xpress SARS-CoV-2/FLU/RSV  testing. Fact Sheet for Patients: PinkCheek.be Fact Sheet for Healthcare Providers: GravelBags.it This test is not yet approved or cleared by the [place] FDA and  has been authorized for detection and/or diagnosis of SARS-CoV-2 by  FDA under an Emergency Use Authorization (EUA). This EUA will remain  in effect (meaning this test can be used) for the duration of the  Covid-19 declaration under Section 564(b)(1) of the Act, 21  U.S.C. section 360bbb-3(b)(1), unless the authorization is  terminated or revoked. Performed at [facility], [place] [address], [place], [place] [address]   MRSA PCR Screening     Status: None   Collection Time: 06/28/19  1:35 PM   Specimen: Nasopharyngeal  Result Value Ref Range Status   MRSA by PCR NEGATIVE NEGATIVE Final    Comment:        The GeneXpert MRSA Assay (FDA approved for NASAL specimens only), is one component of Artina Minella comprehensive MRSA colonization surveillance program. It is not intended to diagnose MRSA infection nor to guide or monitor treatment for MRSA infections. Performed at [facility], [place] [address], [place], [place] [address]          Radiology Studies: DG CHEST PORT 1 VIEW  Result Date:  07/04/2019 CLINICAL DATA:  Hypoxia EXAM: PORTABLE CHEST 1 VIEW COMPARISON:  June 27, 2019 FINDINGS: There remains airspace consolidation throughout much of the right lung as well as in the left mid and lower lung regions. There is slightly less consolidation in the right upper lobe compared to most recent study. Heart is mildly enlarged with pulmonary vascularity normal. No adenopathy. No bone lesions. IMPRESSION: Multifocal airspace opacity bilaterally with less consolidation in the right upper lobe compared to the previous study. Lungs elsewhere appears similar. No new opacity evident. Stable cardiac prominence. No adenopathy evident. Electronically Signed   By: Lowella Grip III M.D.   On: 07/04/2019 08:17        Scheduled Meds: . atorvastatin  20 mg Oral q1800  . Chlorhexidine Gluconate Cloth  6 each Topical Daily  . cycloSPORINE  1 drop Both Eyes Daily  . [START ON 07/05/2019] dexamethasone (DECADRON) injection  6 mg Intravenous Q24H  . enoxaparin (LOVENOX) injection  1 mg/kg Subcutaneous Q12H  . gabapentin  300 mg Oral QHS  . insulin aspart  0-20 Units Subcutaneous TID WC  . insulin aspart  0-5 Units Subcutaneous QHS  . insulin aspart  10 Units Subcutaneous TID WC  . insulin glargine  75 Units Subcutaneous BID  . latanoprost  1 drop Both Eyes QHS  . linagliptin  5 mg Oral Daily  . mouth rinse  15 mL Mouth Rinse BID  . methylPREDNISolone (SOLU-MEDROL) injection  60 mg Intravenous Q8H  . metoprolol tartrate  12.5 mg Oral Q6H  . pantoprazole  40 mg Oral Daily  . sodium chloride flush  3 mL Intravenous Q12H   Continuous Infusions: . sodium chloride Stopped (07/01/19 0355)     LOS: 7 days    Time spent: over 30 min.    Fayrene Helper, MD [facility] Pager AMION  If 7PM-7AM, please contact night-coverage [email or web address] Password [email or web address] 07/04/2019, 1:11 PM

## 2019-07-05 LAB — CBC WITH DIFFERENTIAL/PLATELET
Abs Immature Granulocytes: 0.71 10*3/uL — ABNORMAL HIGH (ref 0.00–0.07)
Basophils Absolute: 0 10*3/uL (ref 0.0–0.1)
Basophils Relative: 0 %
Eosinophils Absolute: 0 10*3/uL (ref 0.0–0.5)
Eosinophils Relative: 0 %
HCT: 37.8 % — ABNORMAL LOW (ref 39.0–52.0)
Hemoglobin: 11.7 g/dL — ABNORMAL LOW (ref 13.0–17.0)
Immature Granulocytes: 7 %
Lymphocytes Relative: 5 %
Lymphs Abs: 0.6 10*3/uL — ABNORMAL LOW (ref 0.7–4.0)
MCH: 27.7 pg (ref 26.0–34.0)
MCHC: 31 g/dL (ref 30.0–36.0)
MCV: 89.6 fL (ref 80.0–100.0)
Monocytes Absolute: 0.4 10*3/uL (ref 0.1–1.0)
Monocytes Relative: 4 %
Neutro Abs: 9.1 10*3/uL — ABNORMAL HIGH (ref 1.7–7.7)
Neutrophils Relative %: 84 %
Platelets: 191 10*3/uL (ref 150–400)
RBC: 4.22 MIL/uL (ref 4.22–5.81)
RDW: 14.4 % (ref 11.5–15.5)
WBC: 10.8 10*3/uL — ABNORMAL HIGH (ref 4.0–10.5)
nRBC: 0.7 % — ABNORMAL HIGH (ref 0.0–0.2)

## 2019-07-05 LAB — COMPREHENSIVE METABOLIC PANEL
ALT: 32 U/L (ref 0–44)
AST: 36 U/L (ref 15–41)
Albumin: 2.3 g/dL — ABNORMAL LOW (ref 3.5–5.0)
Alkaline Phosphatase: 118 U/L (ref 38–126)
Anion gap: 9 (ref 5–15)
BUN: 50 mg/dL — ABNORMAL HIGH (ref 8–23)
CO2: 25 mmol/L (ref 22–32)
Calcium: 8.3 mg/dL — ABNORMAL LOW (ref 8.9–10.3)
Chloride: 107 mmol/L (ref 98–111)
Creatinine, Ser: 1.77 mg/dL — ABNORMAL HIGH (ref 0.61–1.24)
GFR calc Af Amer: 44 mL/min — ABNORMAL LOW (ref >60)
GFR calc non Af Amer: 38 mL/min — ABNORMAL LOW (ref >60)
Glucose, Bld: 233 mg/dL — ABNORMAL HIGH (ref 70–99)
Potassium: 4.7 mmol/L (ref 3.5–5.1)
Sodium: 141 mmol/L (ref 135–145)
Total Bilirubin: 1.1 mg/dL (ref 0.3–1.2)
Total Protein: 5.7 g/dL — ABNORMAL LOW (ref 6.5–8.1)

## 2019-07-05 LAB — GLUCOSE, CAPILLARY
Glucose-Capillary: 192 mg/dL — ABNORMAL HIGH (ref 70–99)
Glucose-Capillary: 239 mg/dL — ABNORMAL HIGH (ref 70–99)
Glucose-Capillary: 447 mg/dL — ABNORMAL HIGH (ref 70–99)
Glucose-Capillary: 395 mg/dL — ABNORMAL HIGH (ref 70–99)

## 2019-07-05 LAB — C-REACTIVE PROTEIN: CRP: 2.7 mg/dL — ABNORMAL HIGH (ref <1.0)

## 2019-07-05 LAB — FERRITIN: Ferritin: 466 ng/mL — ABNORMAL HIGH (ref 24–336)

## 2019-07-05 LAB — PHOSPHORUS: Phosphorus: 4.1 mg/dL (ref 2.5–4.6)

## 2019-07-05 LAB — MAGNESIUM: Magnesium: 2.3 mg/dL (ref 1.7–2.4)

## 2019-07-05 LAB — HEPARIN ANTI-XA: Heparin LMW: >2 IU/mL

## 2019-07-05 LAB — D-DIMER, QUANTITATIVE: D-Dimer, Quant: 11.96 ug/mL-FEU — ABNORMAL HIGH (ref 0.00–0.50)

## 2019-07-05 MED ORDER — AMLODIPINE BESYLATE 5 MG PO TABS
5.0000 mg | ORAL_TABLET | Freq: Once | ORAL | Status: DC
  Filled 2019-07-05: qty 1

## 2019-07-05 MED ORDER — AMLODIPINE BESYLATE 5 MG PO TABS
10.0000 mg | ORAL_TABLET | Freq: Every day | ORAL | Status: DC
  Administered 2019-07-07 – 2019-07-19 (×13): 10 mg via ORAL
  Filled 2019-07-05 (×4): qty 1
  Filled 2019-07-05: qty 2
  Filled 2019-07-05 (×10): qty 1

## 2019-07-05 MED ORDER — INSULIN ASPART 100 UNIT/ML ~~LOC~~ SOLN
14.0000 [IU] | Freq: Three times a day (TID) | SUBCUTANEOUS | Status: DC
  Administered 2019-07-05 – 2019-07-09 (×9): 14 [IU] via SUBCUTANEOUS

## 2019-07-05 NOTE — Progress Notes (Signed)
Occupational Therapy Treatment Patient Details Name: [name] MRN: [id number] DOB: October 03, 1950 Today's Date: 07/05/2019    History of present illness 68 yo with T2DM c/b peripheral neuropathy, OSA, morbid obesity, CKD III, atrial fibrillation, hypertension, chronic pain, anemia, and multiple other medical problems who presented on 12/15 with 1 week of SOB, difficulty sleeping, poor PO intake, and presyncopal symptoms.  He was admitted from the internal medicine clinic to [facility] then transferred to [facility].    OT comments  Pt able to progress OOB today with +2 min A to chair. Desats to low 80s with 3/4 DOE on 5L with stand pivot to recliner. Pt completed flutter valve and began using level 2 theraband. Pt agreeable to post acute rehab prior to DC home. Discussed with nsg. Will continue to follow acutely.   Follow Up Recommendations  CIR;SNF;Supervision/Assistance - 24 hour    Equipment Recommendations  3 in 1 bedside commode;Other (comment)    Recommendations for Other Services Rehab consult    Precautions / Restrictions Precautions Precautions: Fall       Mobility Bed Mobility Overal bed mobility: Needs Assistance Bed Mobility: Rolling;Sidelying to Sit Rolling: Min guard Sidelying to sit: Min assist;HOB elevated          Transfers Overall transfer level: Needs assistance Equipment used: Rolling walker (2 wheeled) Transfers: Sit to/from [facility] Sit to Stand: Min assist;+2 physical assistance Stand pivot transfers: Min assist;+2 physical assistance            Balance Overall balance assessment: Needs assistance   Sitting balance-Leahy Scale: Good       Standing balance-Leahy Scale: Poor Standing balance comment: RELIANT ON EXTERNAL SUPPORT                           ADL either performed or assessed with clinical judgement   ADL Overall ADL's : Needs assistance/impaired     Grooming: Set up   Upper Body Bathing: Set  up;Sitting   Lower Body Bathing: Moderate assistance;Sit to/from stand   Upper Body Dressing : Set up;Sitting   Lower Body Dressing: Moderate assistance;Sit to/from stand   Toilet Transfer: +2 for physical assistance;Minimal assistance;Stand-pivot Armed forces technical officer Details (indicate cue type and reason): simulated         Functional mobility during ADLs: Minimal assistance;+2 for physical assistance;Rolling walker;Cueing for safety General ADL Comments: 3/4 DOE with minimal functional mobility     Vision       Perception     Praxis      Cognition Arousal/Alertness: Awake/alert Behavior During Therapy: WFL for tasks assessed/performed Overall Cognitive Status: No family/caregiver present to determine baseline cognitive functioning                                 General Comments: most likely baseline        Exercises Exercises: Other exercises;General Upper Extremity General Exercises - Upper Extremity Shoulder Flexion: Both;10 reps;Theraband Theraband Level (Shoulder Flexion): Level 2 (Red) Shoulder ABduction: Strengthening;Both;10 reps;Seated;Theraband Theraband Level (Shoulder Abduction): Level 2 (Red) Elbow Flexion: Strengthening;Both;10 reps;Theraband Theraband Level (Elbow Flexion): Level 2 (Red) Other Exercises Other Exercises: FLUTTER X 10   Shoulder Instructions       General Comments      Pertinent Vitals/ Pain       Pain Assessment: 0-10 Pain Score: (40) Pain Location: R hip Pain Descriptors / Indicators: Discomfort;Grimacing;Guarding;Moaning;Aching Pain Intervention(s): Limited  activity within patient's tolerance  Home Living                                          Prior Functioning/Environment              Frequency  Min 2X/week        Progress Toward Goals  OT Goals(current goals can now be found in the care plan section)  Progress towards OT goals: Progressing toward goals  Acute Rehab OT  Goals Patient Stated Goal: to get stronger and go home OT Goal Formulation: With patient Time For Goal Achievement: 07/18/19 Potential to Achieve Goals: Good ADL Goals Pt Will Perform Upper Body Bathing: with set-up;sitting Pt Will Perform Lower Body Bathing: with mod assist;sit to/from stand;with adaptive equipment Pt Will Transfer to Toilet: with +2 assist;with min guard assist;bedside commode;stand pivot transfer  Plan Discharge plan remains appropriate    Co-evaluation    PT/OT/SLP Co-Evaluation/Treatment: Yes Reason for Co-Treatment: For patient/therapist safety;To address functional/ADL transfers   OT goals addressed during session: ADL's and self-care;Strengthening/ROM      AM-PAC OT "6 Clicks" Daily Activity     Outcome Measure   Help from another person eating meals?: None Help from another person taking care of personal grooming?: A Little Help from another person toileting, which includes using toliet, bedpan, or urinal?: A Lot Help from another person bathing (including washing, rinsing, drying)?: A Lot Help from another person to put on and taking off regular upper body clothing?: A Little Help from another person to put on and taking off regular lower body clothing?: A Lot 6 Click Score: 16    End of Session Equipment Utilized During Treatment: Gait belt;Rolling walker;Oxygen(5L)  OT Visit Diagnosis: Unsteadiness on feet (R26.81);Other abnormalities of gait and mobility (R26.89);History of falling (Z91.81);Muscle weakness (generalized) (M62.81);Other symptoms and signs involving cognitive function;Pain Pain - Right/Left: Right Pain - part of body: Hip   Activity Tolerance Patient tolerated treatment well   Patient Left in chair;with call bell/phone within reach;with chair alarm set   Nurse Communication Mobility status        Time: [id number] OT Time Calculation (min): 44 min  Charges: OT General Charges $OT Visit: 1 Visit OT Treatments $Self  Care/Home Management : 8-22 mins $Therapeutic Activity: 8-22 mins  Maurie Boettcher, OT/L   Acute OT Clinical Specialist Acute Rehabilitation Services Pager [phone] Office [phone]    [facility] 07/05/2019, 12:14 PM

## 2019-07-05 NOTE — Progress Notes (Signed)
PHYSICAL THERAPY EVALUATION  HISTORY OF CURRENT ILLNESS: 68 yo with T2DM c/b peripheral neuropathy, OSA, morbid obesity, CKD III, atrial fibrillation, hypertension, chronic pain, anemia, and multiple other medical problems who presented on 12/15 with 1 week of SOB, difficulty sleeping, poor PO intake, and presyncopal symptoms.  He was admitted from the internal medicine clinic to [facility] then transferred to [place]: Pt admitted with above diagnosis. PTA states was living home with spouse, he states he was an independent ambulator with cane and spouse assisted with ADLs as needed. Pt currently with functional limitations due to the deficits listed below (see PT Problem List). This am pt is moving quite well, was able to get to EOB with min-mod a, able to sit unsupported for some time, did desat to low 80s with this needing increased time to recover and cues for deep breathing also flutter valve use. Pt was also able to stand with RW and mod a x 2, and take some short steps/pivoting to recliner. Once in recliner was once again noted to have desat into high 70s, again needing increased time, cues and flutter valve use to recover saturations to high 80s. Pt was initially on room air at therapists arrival and sats in low 90s, with mobility pt was increased to 6L/min and then 8L/min to recover saturations. Pt will benefit from skilled PT to increase their independence and safety with mobility to allow discharge to the venue listed below.       07/05/19 0800  PT Visit Information  Last PT Received On 07/05/19  Assistance Needed +2  PT/OT/SLP Co-Evaluation/Treatment Yes  Reason for Co-Treatment Complexity of the patient's impairments (multi-system involvement);Necessary to address cognition/behavior during functional activity;For patient/therapist safety  PT goals addressed during session Mobility/safety with mobility;Balance;Proper use of DME;Strengthening/ROM  History of Present Illness  68 yo with T2DM c/b peripheral neuropathy, OSA, morbid obesity, CKD III, atrial fibrillation, hypertension, chronic pain, anemia, and multiple other medical problems who presented on 12/15 with 1 week of SOB, difficulty sleeping, poor PO intake, and presyncopal symptoms.  He was admitted from the internal medicine clinic to [facility] then transferred to [facility].   Precautions  Precautions Fall  Precaution Comments desats with activity   Restrictions  Other Position/Activity Restrictions wb may be limited by pain in hip  Home Living  Family/patient expects to be discharged to: Private residence  Living Arrangements Spouse/significant other  Available Help at Discharge Family;Available 24 hours/day  Type of [place] to enter  [place] One level  Bathroom Shower/Tub Walk-in shower  Bathroom Toilet Handicapped height  Bathroom Accessibility Yes  Home Equipment [place] - single point;Grab bars - tub/shower  Prior Function  Level of Independence Needs assistance  Gait / Transfers Assistance Needed ambulated with cane at home  ADL's / [place] spouse assisted as needed with ADLs  Communication / Swallowing Assistance Needed no difficulty  Communication  Communication No difficulties  Pain Assessment  Pain Assessment Faces  Faces Pain Scale 4  Pain Location R hip with standing  Pain Descriptors / Indicators Discomfort;Grimacing  Pain Intervention(s) Limited activity within patient's tolerance  Cognition  Arousal/Alertness Awake/alert  Behavior During Therapy WFL for tasks assessed/performed  Overall Cognitive Status No family/caregiver present to determine baseline cognitive functioning  General Comments seems to at or very close to baseline cognition  Upper Extremity Assessment  Upper Extremity Assessment Generalized weakness  Lower Extremity Assessment  Lower Extremity Assessment Generalized weakness  Bed  Mobility  Overal bed mobility Needs  Assistance  Bed Mobility Supine to Sit  Supine to sit Mod assist  Transfers  Overall transfer level Needs assistance  Equipment used Rolling walker (2 wheeled)  Transfers Sit to/from Stand;Stand Pivot Transfers  Sit to Stand +2 physical assistance;+2 safety/equipment  Stand pivot transfers +2 physical assistance;+2 safety/equipment  Ambulation/Gait  General Gait Details took few steps with pivot to recliner  Balance  Overall balance assessment Needs assistance  Sitting-balance support Feet supported  Sitting balance-Leahy Scale Good  Standing balance-Leahy Scale Poor  Exercises  Exercises Other exercises  Other Exercises  Other Exercises flutter valve use to increase 02 sats  PT - End of Session  Equipment Utilized During Treatment Oxygen  Activity Tolerance Treatment limited secondary to medical complications (Comment);Patient limited by fatigue;Patient limited by lethargy;Patient limited by pain  Patient left in chair;with call bell/phone within reach;with chair alarm set  Nurse Communication Mobility status  PT Assessment  PT Recommendation/Assessment Patient needs continued PT services  PT Visit Diagnosis Unsteadiness on feet (R26.81);Other abnormalities of gait and mobility (R26.89);Muscle weakness (generalized) (M62.81)  PT Problem List Decreased strength;Decreased activity tolerance;Decreased balance;Decreased mobility;Decreased coordination;Decreased safety awareness  PT Plan  PT Frequency (ACUTE ONLY) Min 3X/week  PT Treatment/Interventions (ACUTE ONLY) DME instruction;Gait training;Functional mobility training;Therapeutic activities;Therapeutic exercise;Balance training;Neuromuscular re-education;Patient/family education  AM-PAC PT "6 Clicks" Mobility Outcome Measure (Version 2)  Help needed turning from your back to your side while in a flat bed without using bedrails? 3  Help needed moving from lying on your back to sitting on the side of a flat bed without using  bedrails? 2  Help needed moving to and from a bed to a chair (including a wheelchair)? 2  Help needed standing up from a chair using your arms (e.g., wheelchair or bedside chair)? 2  Help needed to walk in hospital room? 2  Help needed climbing 3-5 steps with a railing?  1  6 Click Score 12  Consider Recommendation of Discharge To: CIR/SNF/LTACH  PT Recommendation  Recommendations for Other Services Rehab consult  Follow Up Recommendations CIR  PT equipment None recommended by PT  Individuals Consulted  Consulted and Agree with Results and Recommendations Patient  Acute Rehab PT Goals  Patient Stated Goal to get stronger and go home  PT Goal Formulation With patient  Time For Goal Achievement 07/19/19  Potential to Achieve Goals Good  PT Time Calculation  PT Start Time (ACUTE ONLY) 0840  PT Stop Time (ACUTE ONLY) 0924  PT Time Calculation (min) (ACUTE ONLY) 44 min  PT General Charges  $$ ACUTE PT VISIT 1 Visit  PT Evaluation  $PT Eval Moderate Complexity 1 Mod  PT Treatments  $Therapeutic Activity 8-22 mins    Horald Chestnut, PT

## 2019-07-05 NOTE — Plan of Care (Signed)

## 2019-07-05 NOTE — Progress Notes (Signed)
ANTICOAGULATION Consult Note  Pharmacy Consulte: Lovenox Indication: Afib (Eliquis PTA), r/o PE with increased d-dimer  Allergies  Allergen Reactions  . Vancomycin     REACTION: ARF    Patient Measurements: Height: 6\' 6"  (198.1 cm) Weight: (!) 401 lb 3.8 oz (182 kg) IBW/kg (Calculated) : 91.4  Vital Signs: Temp: 97.6 F (36.4 C) (12/23 2000) Temp Source: Oral (12/23 2000) BP: 150/69 (12/23 2204) Pulse Rate: 74 (12/23 2204)  Labs: Recent Labs    07/03/19 0316 07/04/19 0537 07/05/19 0111 07/05/19 2200  HGB 11.6* 12.0* 11.7*  --   HCT 37.2* 38.5* 37.8*  --   PLT 144* 180 191  --   HEPRLOWMOCWT  --   --   --  >2.00  CREATININE 1.96* 1.84* 1.77*  --     Estimated Creatinine Clearance: 71.1 mL/min (A) (by C-G formula based on SCr of 1.77 mg/dL (H)).   Assessment: 68 y.o. M on Eliquis 5mg  BID for PAF. Pt reported to MD about missing several Eliquis doses PTA. Changed to full-dose Lovenox 180mg  SQ q12h for r/o PE. Dopplers negative.  Anti-Xa level >2 (supratherapeutic) on 1mg /kg q12h.    SCr improving (3.06 on admission), now down to 1.77. Est norm CrCl ~40 ml/min.  Goal of Therapy:  Anti-Xa level 0.6-1 units/ml 4hrs after LMWH dose given Monitor platelets by anticoagulation protocol: Yes   Plan:  Will discuss with on-call MD and hold Lovenox for now. Consider recheck anti-Xa level in ~12h. Will need lower dose q12h (Dr. Vanita Ingles would like pharmacy to dose Lovenox)  Sherlon Handing, PharmD, BCPS Please see amion for complete clinical pharmacist phone list 07/05/2019,11:57 PM

## 2019-07-05 NOTE — Progress Notes (Signed)
Rehab Admissions Coordinator Note:  Patient was screened by Cleatrice Burke for appropriateness for an Inpatient Acute Rehab Consult per OT recs. Patient COVID + 12/15. Patients are eligible when >20 days since positive, and have recovered or improved from [place]. They do not require retesting for possible admission. Those patients who are less than 20 days from onset still require 2 negative tests prior to possible admission  to [facility] CIR. This patient does not meet that criteria. I will follow his progress. Please call me with any questions.   Cleatrice Burke RN MSN 07/05/2019, 8:17 AM  I can be reached at [phone].

## 2019-07-05 NOTE — Progress Notes (Signed)
PROGRESS NOTE    [name]  [id number] DOB: [birth year] DOA: 07/08/2019 PCP: Aldine Contes, MD   Brief Narrative:  68 yo with T2DM c/b peripheral neuropathy, OSA, morbid obesity, CKD III, atrial fibrillation, hypertension, chronic pain, anemia, and multiple other medical problems who presented on 12/15 with 1 week of SOB, difficulty sleeping, poor PO intake, and presyncopal symptoms.  He was admitted from the internal medicine clinic.  He's received steroids and remdesivir.  Plan for plasma today.  Assessment & Plan:   Principal Problem:   COVID-19 with multiple comorbidities Active Problems:   Diabetes type 2, uncontrolled ([place])   Morbid obesity ([place])   Hypertension   Paroxysmal atrial fibrillation with RVR ([facility])   Respiratory failure with hypoxia ([facility])   Pneumonia due to COVID-19 virus  Acute Hypoxic Respiratory Failure 2/2 COVID 19 Pneumonia:  Much improved today, on 5 L [place], continue weaning O2 as tolerated Continue steroids (day 8 steroids) and remdesivir (s/p 5 days) Convalescent plasma 12/19 Elevated procalcitonin on presentation, so actemra was not given.  He's improving over last several days. I/O, daily weights - hold off on diuresis given kidney function Prone as able, IS Inflammatory markers elevated - CRP improved today.  D dimer remains elevated, but starting to trend down. CXR 12/22 with multifocal airspace opacity bilaterally   COVID-19 Labs  Recent Labs    07/03/19 0316 07/04/19 0537 07/05/19 0111  DDIMER >20.00* 19.53* 11.96*  FERRITIN 506* 454* 466*  CRP 11.4* 5.2* 2.7*    Lab Results  Component Value Date   SARSCOV2NAA POSITIVE (Adrinne Sze) 06/16/2019   [place] NEGATIVE 01/24/2019   Elevated D dimer: while on eliquis, he did miss some days while he was sick.  Transition to full dose lovenox and follow LE [place] (pending) and echo (normal RV systolic function, moderately elevated PASP, low normal EF - see report).  D dimer remains  significantly elevated, but starting to downtrend. If D dimer remains elevated, consider repeat LE [place] vs chest imaging for PE Will continue full dose anticoagulation with lovenox for now.  Consider r/o PE once resp status improves, though unfortunately kidney function at the moment probably precludes contrast.    Atrial Fibrillation with RVR: continue metoprolol PO with IV metop prn for sustained HR over 120.  Follow EKG (NSR).  Anticoagulation as noted above.  AKI on CKD III: baseline creatinine 1.5, presented with creatinine of 3.  Improving, 1.77 today.  ACE on hold.  Elevated LFT's: Improved.  Acute hepatitis panel ([id number]).    Type 2 DM  Diabetic Neuropathy:  He's on U500 insulin at home, gets this via Neleh Muldoon pump.  Says he uses about 70 units of U500 daily (which is similar to what the med rec says) -> that's more like ~350 units of U100 insulin.   Will increase lantus to 75 units BID and follow closely.  Mealtime insulin.  Tradjenta.  Adjust as needed.   Gabapentin for neuropathy  HTN  [name]: continue metop.  Start amlodipine with elevated BP's, increase to 10 mg.  Ace on hold as well as lasix due to kidney function.  Continue atorva.  OSA: CPAP on hold given COVID 19 infection  Insomnia: trazodone prn qhs  Thrombocytopenia: follow on anticoagulation, possibly related to viral illness.  Improved.  Glaucoma: continue home eye drops  DVT prophylaxis: lovenox Code Status: full Family Communication: none at bedside - discussed with wife 12/19, 12/20, 12/21, and 12/22 Disposition Plan: pending   Consultants:   PCCM  Procedures:  Echo IMPRESSIONS    1. Left ventricular ejection fraction, by visual estimation, is 50 to 55%. The left ventricle has low normal function. There is mildly increased left ventricular hypertrophy.  2. The left ventricle has no regional wall motion abnormalities.  3. Global right ventricle has normal systolic function.The right ventricular size is not  well visualized. No increase in right ventricular wall thickness.  4. Left atrial size was normal.  5. Right atrial size was normal.  6. Small pericardial effusion.  7. The pericardial effusion is circumferential.  8. The mitral valve is normal in structure. Trivial mitral valve regurgitation.  9. The tricuspid valve is normal in structure. Tricuspid valve regurgitation is trivial. 10. The aortic valve is tricuspid. Aortic valve regurgitation is not visualized. No evidence of aortic valve sclerosis or stenosis. 11. The pulmonic valve was grossly normal. Pulmonic valve regurgitation is trivial. 12. Moderately elevated pulmonary artery systolic pressure. 13. The atrial septum is grossly normal.  LE [place]   Summary: Right: There is no evidence of deep vein thrombosis in the lower extremity. However, portions of this examination were limited- see technologist comments above. Left: There is no evidence of deep vein thrombosis in the lower extremity. However, portions of this examination were limited- see technologist comments above.   *See table(s) above for measurements and observations.  Antimicrobials:  Anti-infectives (From admission, onward)   Start     Dose/Rate Route Frequency Ordered Stop   06/28/19 2100  remdesivir 100 mg in sodium chloride 0.9 % 100 mL IVPB     100 mg 200 mL/hr over 30 Minutes Intravenous Daily 06/16/2019 2050 07/01/19 2107   06/21/2019 2100  remdesivir 200 mg in sodium chloride 0.9% 250 mL IVPB     200 mg 580 mL/hr over 30 Minutes Intravenous Once 06/18/2019 2050 06/28/19 0823     Subjective: No new complaints. Asking about bedside commode.  Objective: Vitals:   07/05/19 0428 07/05/19 0630 07/05/19 0859 07/05/19 1103  BP: (!) 176/84 (!) 159/80  (!) 165/78  Pulse: 72 71 70 66  Resp:  19 (!) 26 (!) 26  Temp:  97.9 F (36.6 C)  (!) 97.5 F (36.4 C)  TempSrc:  Oral  Axillary  SpO2:  95% 91% 90%  Weight:      Height:        Intake/Output Summary (Last 24  hours) at 07/05/2019 1624 Last data filed at 07/05/2019 1100 Gross per 24 hour  Intake 702 ml  Output 1510 ml  Net -808 ml   Filed Weights   06/28/19 0443 06/30/19 1900  Weight: (!) 182.4 kg (!) 182 kg    Examination:  General: No acute distress. Cardiovascular: RRR Lungs: CTAB, distant Abdomen: Soft, nontender, nondistended  Neurological: Alert and oriented 3. Moves all extremities 4. Cranial nerves II through XII grossly intact. Skin: Warm and dry. No rashes or lesions. Extremities: No clubbing or cyanosis. No edema.    Data Reviewed: I have personally reviewed following labs and imaging studies  CBC: Recent Labs  Lab 07/01/19 0636 07/02/19 0445 07/03/19 0316 07/04/19 0537 07/05/19 0111  WBC 7.5 8.7 11.3* 11.2* 10.8*  NEUTROABS 6.7 7.9* 9.8* 9.2* 9.1*  HGB 11.4* 11.3* 11.6* 12.0* 11.7*  HCT 36.5* 35.8* 37.2* 38.5* 37.8*  MCV 88.8 88.4 89.6 90.4 89.6  PLT 83* 95* 144* 180 [id number]   Basic Metabolic Panel: Recent Labs  Lab 07/01/19 0636 07/02/19 0445 07/03/19 0316 07/04/19 0537 07/05/19 0111  NA 139 136 137 141 141  K 4.4  4.2 4.3 4.6 4.7  CL 104 101 101 105 107  CO2 22 23 23 25 25   GLUCOSE 290* 384* 322* 249* 233*  BUN 49* 52* 62* 52* 50*  CREATININE 1.92* 1.78* 1.96* 1.84* 1.77*  CALCIUM 7.5* 7.7* 8.1* 8.2* 8.3*  MG 2.0  --  2.3 2.4 2.3  PHOS  --   --   --   --  4.1   GFR: Estimated Creatinine Clearance: 71.1 mL/min (Jalayne Ganesh) (by C-G formula based on SCr of 1.77 mg/dL (H)). Liver Function Tests: Recent Labs  Lab 07/01/19 0636 07/02/19 0445 07/03/19 0316 07/04/19 0537 07/05/19 0111  AST 59* 50* 40 36 36  ALT 49* 44 38 34 32  ALKPHOS 127* 155* 165* 137* 118  BILITOT 1.3* 1.7* 1.3* 1.4* 1.1  PROT 6.0* 6.1* 6.3* 6.0* 5.7*  ALBUMIN 2.2* 2.3* 2.3* 2.4* 2.3*   No results for input(s): LIPASE, AMYLASE in the last 168 hours. No results for input(s): AMMONIA in the last 168 hours. Coagulation Profile: No results for input(s): INR, PROTIME in the last 168  hours. Cardiac Enzymes: No results for input(s): CKTOTAL, CKMB, CKMBINDEX, TROPONINI in the last 168 hours. BNP (last 3 results) No results for input(s): PROBNP in the last 8760 hours. HbA1C: No results for input(s): HGBA1C in the last 72 hours. CBG: Recent Labs  Lab 07/04/19 1206 07/04/19 1609 07/04/19 2032 07/05/19 0731 07/05/19 1154  GLUCAP 304* 248* 250* 192* 239*   Lipid Profile: No results for input(s): CHOL, HDL, LDLCALC, TRIG, CHOLHDL, LDLDIRECT in the last 72 hours. Thyroid Function Tests: No results for input(s): TSH, T4TOTAL, FREET4, T3FREE, THYROIDAB in the last 72 hours. Anemia Panel: Recent Labs    07/04/19 0537 07/05/19 0111  FERRITIN 454* 466*   Sepsis Labs: Recent Labs  Lab 07/01/19 0636 07/02/19 0445 07/03/19 0316  PROCALCITON 0.86 0.81 0.65    Recent Results (from the past 240 hour(s))  Respiratory Panel by RT PCR (Flu Ronin Crager&B, Covid) -     Status: Abnormal   Collection Time: 07/02/2019  1:50 PM  Result Value Ref Range Status   SARS Coronavirus 2 by RT PCR POSITIVE (Etty Isaac) NEGATIVE Final    Comment: RESULT CALLED TO, READ BACK BY AND VERIFIED WITH: O AGYEI MD 06/26/2019 1807 JDW (NOTE) SARS-CoV-2 target nucleic acids are DETECTED. SARS-CoV-2 RNA is generally detectable in upper respiratory specimens  during the acute phase of infection. Positive results are indicative of the presence of the identified virus, but do not rule out bacterial infection or co-infection with other pathogens not detected by the test. Clinical correlation with patient history and other diagnostic information is necessary to determine patient infection status. The expected result is Negative. Fact Sheet for Patients:  PinkCheek.be Fact Sheet for Healthcare Providers: GravelBags.it This test is not yet approved or cleared by the [place] FDA and  has been authorized for detection and/or diagnosis of SARS-CoV-2 by FDA  under an Emergency Use Authorization (EUA).  This EUA will remain in effect (meaning this test can be used) for the  duration of  the COVID-19 declaration under Section 564(b)(1) of the Act, 21 U.S.C. section 360bbb-3(b)(1), unless the authorization is terminated or revoked sooner.    Influenza D'Arcy Abraha by PCR NEGATIVE NEGATIVE Final   Influenza B by PCR NEGATIVE NEGATIVE Final    Comment: (NOTE) The Xpert Xpress SARS-CoV-2/FLU/RSV assay is intended as an aid in  the diagnosis of influenza from Nasopharyngeal swab specimens and  should not be used as Joniah Bednarski sole basis for  treatment. Nasal washings and  aspirates are unacceptable for Xpert Xpress SARS-CoV-2/FLU/RSV  testing. Fact Sheet for Patients: PinkCheek.be Fact Sheet for Healthcare Providers: GravelBags.it This test is not yet approved or cleared by the [place] FDA and  has been authorized for detection and/or diagnosis of SARS-CoV-2 by  FDA under an Emergency Use Authorization (EUA). This EUA will remain  in effect (meaning this test can be used) for the duration of the  Covid-19 declaration under Section 564(b)(1) of the Act, 21  U.S.C. section 360bbb-3(b)(1), unless the authorization is  terminated or revoked. Performed at [facility], [place] [address] [place], [place] [address]   MRSA PCR Screening     Status: None   Collection Time: 06/28/19  1:35 PM   Specimen: Nasopharyngeal  Result Value Ref Range Status   MRSA by PCR NEGATIVE NEGATIVE Final    Comment:        The GeneXpert MRSA Assay (FDA approved for NASAL specimens only), is one component of Edoardo Laforte comprehensive MRSA colonization surveillance program. It is not intended to diagnose MRSA infection nor to guide or monitor treatment for MRSA infections. Performed at [facility], [place] [address], [place], [place] [address]          Radiology Studies: DG CHEST PORT 1 VIEW  Result Date:  07/04/2019 CLINICAL DATA:  Hypoxia EXAM: PORTABLE CHEST 1 VIEW COMPARISON:  June 27, 2019 FINDINGS: There remains airspace consolidation throughout much of the right lung as well as in the left mid and lower lung regions. There is slightly less consolidation in the right upper lobe compared to most recent study. Heart is mildly enlarged with pulmonary vascularity normal. No adenopathy. No bone lesions. IMPRESSION: Multifocal airspace opacity bilaterally with less consolidation in the right upper lobe compared to the previous study. Lungs elsewhere appears similar. No new opacity evident. Stable cardiac prominence. No adenopathy evident. Electronically Signed   By: Lowella Grip III M.D.   On: 07/04/2019 08:17        Scheduled Meds: . amLODipine  5 mg Oral Daily  . atorvastatin  20 mg Oral q1800  . Chlorhexidine Gluconate Cloth  6 each Topical Daily  . cycloSPORINE  1 drop Both Eyes Daily  . dexamethasone (DECADRON) injection  6 mg Intravenous Q24H  . enoxaparin (LOVENOX) injection  1 mg/kg Subcutaneous Q12H  . gabapentin  300 mg Oral QHS  . insulin aspart  0-20 Units Subcutaneous TID WC  . insulin aspart  0-5 Units Subcutaneous QHS  . insulin aspart  10 Units Subcutaneous TID WC  . insulin glargine  75 Units Subcutaneous BID  . latanoprost  1 drop Both Eyes QHS  . linagliptin  5 mg Oral Daily  . mouth rinse  15 mL Mouth Rinse BID  . metoprolol tartrate  12.5 mg Oral Q6H  . pantoprazole  40 mg Oral Daily  . sodium chloride flush  3 mL Intravenous Q12H   Continuous Infusions: . sodium chloride Stopped (07/01/19 0355)     LOS: 8 days    Time spent: over 30 min.    Fayrene Helper, MD [facility] Pager AMION  If 7PM-7AM, please contact night-coverage [email or web address] Password [facility] 07/05/2019, 4:24 PM

## 2019-07-06 ENCOUNTER — Inpatient Hospital Stay (HOSPITAL_COMMUNITY): Payer: Medicare Other

## 2019-07-06 LAB — APTT: aPTT: 37 seconds — ABNORMAL HIGH (ref 24–36)

## 2019-07-06 LAB — GLUCOSE, CAPILLARY
Glucose-Capillary: 161 mg/dL — ABNORMAL HIGH (ref 70–99)
Glucose-Capillary: 114 mg/dL — ABNORMAL HIGH (ref 70–99)
Glucose-Capillary: 173 mg/dL — ABNORMAL HIGH (ref 70–99)
Glucose-Capillary: 256 mg/dL — ABNORMAL HIGH (ref 70–99)

## 2019-07-06 LAB — CBC WITH DIFFERENTIAL/PLATELET
Abs Immature Granulocytes: 0.31 10*3/uL — ABNORMAL HIGH (ref 0.00–0.07)
Basophils Absolute: 0 10*3/uL (ref 0.0–0.1)
Basophils Relative: 0 %
Eosinophils Absolute: 0 10*3/uL (ref 0.0–0.5)
Eosinophils Relative: 0 %
HCT: 37.1 % — ABNORMAL LOW (ref 39.0–52.0)
Hemoglobin: 11.1 g/dL — ABNORMAL LOW (ref 13.0–17.0)
Immature Granulocytes: 3 %
Lymphocytes Relative: 4 %
Lymphs Abs: 0.5 10*3/uL — ABNORMAL LOW (ref 0.7–4.0)
MCH: 27.8 pg (ref 26.0–34.0)
MCHC: 29.9 g/dL — ABNORMAL LOW (ref 30.0–36.0)
MCV: 92.8 fL (ref 80.0–100.0)
Monocytes Absolute: 0.4 10*3/uL (ref 0.1–1.0)
Monocytes Relative: 4 %
Neutro Abs: 9.9 10*3/uL — ABNORMAL HIGH (ref 1.7–7.7)
Neutrophils Relative %: 89 %
Platelets: 188 10*3/uL (ref 150–400)
RBC: 4 MIL/uL — ABNORMAL LOW (ref 4.22–5.81)
RDW: 14.6 % (ref 11.5–15.5)
WBC: 11.1 10*3/uL — ABNORMAL HIGH (ref 4.0–10.5)
nRBC: 0.4 % — ABNORMAL HIGH (ref 0.0–0.2)

## 2019-07-06 LAB — COMPREHENSIVE METABOLIC PANEL
ALT: 49 U/L — ABNORMAL HIGH (ref 0–44)
AST: 39 U/L (ref 15–41)
Albumin: 2.3 g/dL — ABNORMAL LOW (ref 3.5–5.0)
Alkaline Phosphatase: 111 U/L (ref 38–126)
Anion gap: 11 (ref 5–15)
BUN: 58 mg/dL — ABNORMAL HIGH (ref 8–23)
CO2: 27 mmol/L (ref 22–32)
Calcium: 8.3 mg/dL — ABNORMAL LOW (ref 8.9–10.3)
Chloride: 103 mmol/L (ref 98–111)
Creatinine, Ser: 1.98 mg/dL — ABNORMAL HIGH (ref 0.61–1.24)
GFR calc Af Amer: 39 mL/min — ABNORMAL LOW (ref >60)
GFR calc non Af Amer: 33 mL/min — ABNORMAL LOW (ref >60)
Glucose, Bld: 270 mg/dL — ABNORMAL HIGH (ref 70–99)
Potassium: 4.5 mmol/L (ref 3.5–5.1)
Sodium: 141 mmol/L (ref 135–145)
Total Bilirubin: 1 mg/dL (ref 0.3–1.2)
Total Protein: 5.7 g/dL — ABNORMAL LOW (ref 6.5–8.1)

## 2019-07-06 LAB — D-DIMER, QUANTITATIVE: D-Dimer, Quant: 7.46 ug/mL-FEU — ABNORMAL HIGH (ref 0.00–0.50)

## 2019-07-06 LAB — HEMOGLOBIN AND HEMATOCRIT, BLOOD
HCT: 34.8 % — ABNORMAL LOW (ref 39.0–52.0)
Hemoglobin: 10.7 g/dL — ABNORMAL LOW (ref 13.0–17.0)

## 2019-07-06 LAB — HEPARIN ANTI-XA: Heparin LMW: >2 IU/mL

## 2019-07-06 LAB — MAGNESIUM: Magnesium: 2.4 mg/dL (ref 1.7–2.4)

## 2019-07-06 LAB — C-REACTIVE PROTEIN: CRP: 1.7 mg/dL — ABNORMAL HIGH (ref <1.0)

## 2019-07-06 LAB — PROTIME-INR
INR: 1.2 (ref 0.8–1.2)
Prothrombin Time: 15 seconds (ref 11.4–15.2)

## 2019-07-06 LAB — FERRITIN: Ferritin: 535 ng/mL — ABNORMAL HIGH (ref 24–336)

## 2019-07-06 LAB — PHOSPHORUS: Phosphorus: 5.6 mg/dL — ABNORMAL HIGH (ref 2.5–4.6)

## 2019-07-06 NOTE — Progress Notes (Signed)
Left chest swelling, firm and tender to touch. This RN marked the affected area with marker. MD notified of area and CT results. No new orders received. Will continue to monitor.

## 2019-07-06 NOTE — Progress Notes (Addendum)
PROGRESS NOTE    [name]  [id number] DOB: [birth year] DOA: 06/22/2019 PCP: Aldine Contes, MD   Brief Narrative:  68 yo with T2DM c/b peripheral neuropathy, OSA, morbid obesity, CKD III, atrial fibrillation, hypertension, chronic pain, anemia, and multiple other medical problems who presented on 12/15 with 1 week of SOB, difficulty sleeping, poor PO intake, and presyncopal symptoms.  He was admitted from the internal medicine clinic.  He's received steroids and remdesivir.  Plan for plasma today.  Assessment & Plan:   Principal Problem:   COVID-19 with multiple comorbidities Active Problems:   Diabetes type 2, uncontrolled ([place])   Morbid obesity ([place])   Hypertension   Paroxysmal atrial fibrillation with RVR (H[facility])   Respiratory failure with hypoxia (H[facility])   Pneumonia due to COVID-19 virus  Acute Hypoxic Respiratory Failure 2/2 COVID 19 Pneumonia:  Continues on 5 L [place], continue weaning O2 as tolerated Continue steroids (day 9 steroids) and remdesivir (s/p 5 days) Convalescent plasma 12/19 Elevated procalcitonin on presentation, so actemra was not given.  He's improving over last several days. I/O, daily weights - hold off on diuresis given kidney function Prone as able, IS Inflammatory markers elevated - CRP improved today.  D dimer remains elevated, but starting to trend down. CXR 12/22 with multifocal airspace opacity bilaterally   COVID-19 Labs  Recent Labs    07/04/19 0537 07/05/19 0111 07/06/19 0230  DDIMER 19.53* 11.96* 7.46*  FERRITIN 454* 466* 535*  CRP 5.2* 2.7* 1.7*    Lab Results  Component Value Date   SARSCOV2NAA POSITIVE (Lamija Besse) 06/25/2019   SARSCOV2NAA NEGATIVE 01/24/2019   Left Chest Wall Swelling: concerning for possible hematoma while on lovenox.  Will hold anticoagulation and follow CT chest.  Trend H/H.  Elevated D dimer: while on eliquis, he did miss some days while he was sick.  Transition to full dose lovenox and follow LE [place]  (pending) and echo (normal RV systolic function, moderately elevated PASP, low normal EF - see report).  D dimer remains significantly elevated, but starting to downtrend. If D dimer remains elevated, consider repeat LE [place] vs chest imaging for PE Anticoagulation currently on hold.  Of note, anti - Xa level has been supratherapeutic on lovenox BID.  Consider heparin if restarting anticoagulation.  Atrial Fibrillation with RVR: continue metoprolol PO with IV metop prn for sustained HR over 120.  Follow EKG (NSR).  Anticoagulation on hold.  AKI on CKD III: baseline creatinine 1.5, presented with creatinine of 3.  ACE on hold.  Recently creatinine has been fluctuating.  1.9 today.    Elevated LFT's: Improved.  Acute hepatitis panel (negat9ve).    Type 2 DM  Diabetic Neuropathy:  He's on U500 insulin at home, gets this via [employer] pump.  Says he uses about 70 units of U500 daily (which is similar to what the med rec says) -> that's more like ~350 units of U100 insulin.   Will increase lantus to 75 units BID and follow closely.  Mealtime insulin.  Tradjenta.  Adjust as needed.  BG reasonable today.  Follow. Gabapentin for neuropathy  HTN  [name]: continue metop.  Start amlodipine with elevated BP's, increase to 10 mg.  Ace on hold as well as lasix due to kidney function.  Continue atorva.  BP improved.    OSA: CPAP on hold given COVID 19 infection  Insomnia: trazodone prn qhs  Thrombocytopenia: resolved  Glaucoma: continue home eye drops  DVT prophylaxis: lovenox Code Status: full Family Communication: none at  bedside - discussed with wife 12/24 Disposition Plan: pending   Consultants:   PCCM  Procedures:  Echo IMPRESSIONS    1. Left ventricular ejection fraction, by visual estimation, is 50 to 55%. The left ventricle has low normal function. There is mildly increased left ventricular hypertrophy.  2. The left ventricle has no regional wall motion abnormalities.  3. Global right  ventricle has normal systolic function.The right ventricular size is not well visualized. No increase in right ventricular wall thickness.  4. Left atrial size was normal.  5. Right atrial size was normal.  6. Small pericardial effusion.  7. The pericardial effusion is circumferential.  8. The mitral valve is normal in structure. Trivial mitral valve regurgitation.  9. The tricuspid valve is normal in structure. Tricuspid valve regurgitation is trivial. 10. The aortic valve is tricuspid. Aortic valve regurgitation is not visualized. No evidence of aortic valve sclerosis or stenosis. 11. The pulmonic valve was grossly normal. Pulmonic valve regurgitation is trivial. 12. Moderately elevated pulmonary artery systolic pressure. 13. The atrial septum is grossly normal.  LE [place]   Summary: Right: There is no evidence of deep vein thrombosis in the lower extremity. However, portions of this examination were limited- see technologist comments above. Left: There is no evidence of deep vein thrombosis in the lower extremity. However, portions of this examination were limited- see technologist comments above.   *See table(s) above for measurements and observations.  Antimicrobials:  Anti-infectives (From admission, onward)   Start     Dose/Rate Route Frequency Ordered Stop   06/28/19 2100  remdesivir 100 mg in sodium chloride 0.9 % 100 mL IVPB     100 mg 200 mL/hr over 30 Minutes Intravenous Daily 06/14/2019 2050 07/01/19 2107   06/20/2019 2100  remdesivir 200 mg in sodium chloride 0.9% 250 mL IVPB     200 mg 580 mL/hr over 30 Minutes Intravenous Once 07/12/2019 2050 06/28/19 0823     Subjective: No new complaints. Asking about bedside commode.  Objective: Vitals:   07/06/19 0900 07/06/19 1000 07/06/19 1254 07/06/19 1553  BP:   123/64 119/67  Pulse: 72 68 74 77  Resp: 16 18 12 17   Temp:   98.3 F (36.8 C) 98.3 F (36.8 C)  TempSrc:   Oral Oral  SpO2: 92% 91% 91% 90%  Weight:      Height:         Intake/Output Summary (Last 24 hours) at 07/06/2019 1723 Last data filed at 07/06/2019 1300 Gross per 24 hour  Intake 804 ml  Output 1750 ml  Net -946 ml   Filed Weights   06/28/19 0443 06/30/19 1900  Weight: (!) 182.4 kg (!) 182 kg    Examination:  General: No acute distress. Cardiovascular: RRR.  L chest with swelling, tense, mildly tender to palpation Lungs: comfortable on 5 L [place], distant lung sounds, no increased WOB Abdomen: Soft, nontender, nondistended  Neurological: Alert and oriented 3. Moves all extremities 4. Cranial nerves II through XII grossly intact. Skin: Warm and dry. No rashes or lesions. Extremities: No clubbing or cyanosis. No edema.   Data Reviewed: I have personally reviewed following labs and imaging studies  CBC: Recent Labs  Lab 07/02/19 0445 07/03/19 0316 07/04/19 0537 07/05/19 0111 07/06/19 0230  WBC 8.7 11.3* 11.2* 10.8* 11.1*  NEUTROABS 7.9* 9.8* 9.2* 9.1* 9.9*  HGB 11.3* 11.6* 12.0* 11.7* 11.1*  HCT 35.8* 37.2* 38.5* 37.8* 37.1*  MCV 88.4 89.6 90.4 89.6 92.8  PLT 95* 144* 180 191  0000000   Basic Metabolic Panel: Recent Labs  Lab 07/01/19 0636 07/02/19 0445 07/03/19 0316 07/04/19 0537 07/05/19 0111 07/06/19 0230  NA 139 136 137 141 141 141  K 4.4 4.2 4.3 4.6 4.7 4.5  CL 104 101 101 105 107 103  CO2 22 23 23 25 25 27   GLUCOSE 290* 384* 322* 249* 233* 270*  BUN 49* 52* 62* 52* 50* 58*  CREATININE 1.92* 1.78* 1.96* 1.84* 1.77* 1.98*  CALCIUM 7.5* 7.7* 8.1* 8.2* 8.3* 8.3*  MG 2.0  --  2.3 2.4 2.3 2.4  PHOS  --   --   --   --  4.1 5.6*   GFR: Estimated Creatinine Clearance: 63.5 mL/min (Sidney Silberman) (by C-G formula based on SCr of 1.98 mg/dL (H)). Liver Function Tests: Recent Labs  Lab 07/02/19 0445 07/03/19 0316 07/04/19 0537 07/05/19 0111 07/06/19 0230  AST 50* 40 36 36 39  ALT 44 38 34 32 49*  ALKPHOS 155* 165* 137* 118 111  BILITOT 1.7* 1.3* 1.4* 1.1 1.0  PROT 6.1* 6.3* 6.0* 5.7* 5.7*  ALBUMIN 2.3* 2.3* 2.4* 2.3* 2.3*    No results for input(s): LIPASE, AMYLASE in the last 168 hours. No results for input(s): AMMONIA in the last 168 hours. Coagulation Profile: No results for input(s): INR, PROTIME in the last 168 hours. Cardiac Enzymes: No results for input(s): CKTOTAL, CKMB, CKMBINDEX, TROPONINI in the last 168 hours. BNP (last 3 results) No results for input(s): PROBNP in the last 8760 hours. HbA1C: No results for input(s): HGBA1C in the last 72 hours. CBG: Recent Labs  Lab 07/05/19 1657 07/05/19 2149 07/06/19 0720 07/06/19 1155 07/06/19 1639  GLUCAP 447* 395* 161* 114* 173*   Lipid Profile: No results for input(s): CHOL, HDL, LDLCALC, TRIG, CHOLHDL, LDLDIRECT in the last 72 hours. Thyroid Function Tests: No results for input(s): TSH, T4TOTAL, FREET4, T3FREE, THYROIDAB in the last 72 hours. Anemia Panel: Recent Labs    07/05/19 0111 07/06/19 0230  FERRITIN 466* 535*   Sepsis Labs: Recent Labs  Lab 07/01/19 0636 07/02/19 0445 07/03/19 0316  PROCALCITON 0.86 0.81 0.65    Recent Results (from the past 240 hour(s))  Respiratory Panel by RT PCR (Flu Haider Hornaday&B, Covid) -     Status: Abnormal   Collection Time: 06/23/2019  1:50 PM  Result Value Ref Range Status   SARS Coronavirus 2 by RT PCR POSITIVE (Gorman Safi) NEGATIVE Final    Comment: RESULT CALLED TO, READ BACK BY AND VERIFIED WITH: O AGYEI MD 06/21/2019 1807 JDW (NOTE) SARS-CoV-2 target nucleic acids are DETECTED. SARS-CoV-2 RNA is generally detectable in upper respiratory specimens  during the acute phase of infection. Positive results are indicative of the presence of the identified virus, but do not rule out bacterial infection or co-infection with other pathogens not detected by the test. Clinical correlation with patient history and other diagnostic information is necessary to determine patient infection status. The expected result is Negative. Fact Sheet for Patients:  PinkCheek.be Fact Sheet for  Healthcare Providers: GravelBags.it This test is not yet approved or cleared by the [place] FDA and  has been authorized for detection and/or diagnosis of SARS-CoV-2 by FDA under an Emergency Use Authorization (EUA).  This EUA will remain in effect (meaning this test can be used) for the  duration of  the COVID-19 declaration under Section 564(b)(1) of the Act, 21 U.S.C. section 360bbb-3(b)(1), unless the authorization is terminated or revoked sooner.    Influenza Ericka Marcellus by PCR NEGATIVE NEGATIVE Final   Influenza  B by PCR NEGATIVE NEGATIVE Final    Comment: (NOTE) The Xpert Xpress SARS-CoV-2/FLU/RSV assay is intended as an aid in  the diagnosis of influenza from Nasopharyngeal swab specimens and  should not be used as Rosaline Ezekiel sole basis for treatment. Nasal washings and  aspirates are unacceptable for Xpert Xpress SARS-CoV-2/FLU/RSV  testing. Fact Sheet for Patients: PinkCheek.be Fact Sheet for Healthcare Providers: GravelBags.it This test is not yet approved or cleared by the [place] FDA and  has been authorized for detection and/or diagnosis of SARS-CoV-2 by  FDA under an Emergency Use Authorization (EUA). This EUA will remain  in effect (meaning this test can be used) for the duration of the  Covid-19 declaration under Section 564(b)(1) of the Act, 21  U.S.C. section 360bbb-3(b)(1), unless the authorization is  terminated or revoked. Performed at [facility], [place] [address] [place], [place] [address]   MRSA PCR Screening     Status: None   Collection Time: 06/28/19  1:35 PM   Specimen: Nasopharyngeal  Result Value Ref Range Status   MRSA by PCR NEGATIVE NEGATIVE Final    Comment:        The GeneXpert MRSA Assay (FDA approved for NASAL specimens only), is one component of Imari Reen comprehensive MRSA colonization surveillance program. It is not intended to diagnose MRSA infection nor to  guide or monitor treatment for MRSA infections. Performed at [facility], [place] [address], [place], [place] [address]          Radiology Studies: No results found.      Scheduled Meds: . amLODipine  10 mg Oral Daily  . amLODipine  5 mg Oral Once  . atorvastatin  20 mg Oral q1800  . Chlorhexidine Gluconate Cloth  6 each Topical Daily  . cycloSPORINE  1 drop Both Eyes Daily  . gabapentin  300 mg Oral QHS  . insulin aspart  0-20 Units Subcutaneous TID WC  . insulin aspart  0-5 Units Subcutaneous QHS  . insulin aspart  14 Units Subcutaneous TID WC  . insulin glargine  75 Units Subcutaneous BID  . latanoprost  1 drop Both Eyes QHS  . linagliptin  5 mg Oral Daily  . mouth rinse  15 mL Mouth Rinse BID  . metoprolol tartrate  12.5 mg Oral Q6H  . pantoprazole  40 mg Oral Daily  . sodium chloride flush  3 mL Intravenous Q12H   Continuous Infusions: . sodium chloride Stopped (07/01/19 0355)     LOS: 9 days    Time spent: over 30 min.    Fayrene Helper, MD [facility] Pager AMION  If 7PM-7AM, please contact night-coverage [email or web address] Password [facility] 07/06/2019, 5:23 PM

## 2019-07-06 NOTE — Progress Notes (Signed)
ANTICOAGULATION Consult Note  Pharmacy Consulte: Lovenox>IV UFH Indication: Afib (Eliquis PTA), r/o PE with increased d-dimer  Allergies  Allergen Reactions  . Vancomycin     REACTION: ARF    Patient Measurements: Height: 6\' 6"  (198.1 cm) Weight: (!) 401 lb 3.8 oz (182 kg) IBW/kg (Calculated) : 91.4  Vital Signs: Temp: 98 F (36.7 C) (12/24 0401) Temp Source: Oral (12/24 0401) BP: 136/65 (12/24 0401) Pulse Rate: 72 (12/24 0401)  Labs: Recent Labs    07/04/19 0537 07/05/19 0111 07/05/19 2200 07/06/19 0230 07/06/19 1005  HGB 12.0* 11.7*  --  11.1*  --   HCT 38.5* 37.8*  --  37.1*  --   PLT 180 191  --  188  --   HEPRLOWMOCWT  --   --  >2.00  --  >2.00  CREATININE 1.84* 1.77*  --  1.98*  --     Estimated Creatinine Clearance: 63.5 mL/min (A) (by C-G formula based on SCr of 1.98 mg/dL (H)).   Assessment: 68 y.o. M on Eliquis 5mg  BID for PAF. Pt reported to MD about missing several Eliquis doses PTA. Changed to full-dose Lovenox 180mg  SQ q12h for r/o PE. Dopplers negative.Anti-Xa remains > 2 despite holding Lovenox. Will transition to heparin drip until AKI resolves.   Goal of Therapy:  Heparin level 0.3-0.7 units/ml Monitor platelets by anticoagulation protocol: Yes   Plan:  - Recheck anti-Xa LMWH in 12 hours - Initiate heparin drip when anti-Xa < 0.5  Ulice Dash, PharmD, BCPS 07/06/2019,12:59 PM

## 2019-07-06 NOTE — Progress Notes (Signed)
Notified MD of pt left chest wall now has a harden area from clavicle to nipple area. Pt now up in chair. No apparent distress.  Awaiting orders.

## 2019-07-06 NOTE — Plan of Care (Signed)

## 2019-07-07 ENCOUNTER — Other Ambulatory Visit: Payer: Self-pay | Admitting: Internal Medicine

## 2019-07-07 ENCOUNTER — Other Ambulatory Visit: Payer: Self-pay | Admitting: Endocrinology

## 2019-07-07 DIAGNOSIS — R110 Nausea: Secondary | ICD-10-CM

## 2019-07-07 LAB — COMPREHENSIVE METABOLIC PANEL
ALT: 34 U/L (ref 0–44)
AST: 31 U/L (ref 15–41)
Albumin: 2.3 g/dL — ABNORMAL LOW (ref 3.5–5.0)
Alkaline Phosphatase: 80 U/L (ref 38–126)
Anion gap: 8 (ref 5–15)
BUN: 53 mg/dL — ABNORMAL HIGH (ref 8–23)
CO2: 27 mmol/L (ref 22–32)
Calcium: 7.9 mg/dL — ABNORMAL LOW (ref 8.9–10.3)
Chloride: 104 mmol/L (ref 98–111)
Creatinine, Ser: 1.95 mg/dL — ABNORMAL HIGH (ref 0.61–1.24)
GFR calc Af Amer: 40 mL/min — ABNORMAL LOW (ref >60)
GFR calc non Af Amer: 34 mL/min — ABNORMAL LOW (ref >60)
Glucose, Bld: 123 mg/dL — ABNORMAL HIGH (ref 70–99)
Potassium: 4.4 mmol/L (ref 3.5–5.1)
Sodium: 139 mmol/L (ref 135–145)
Total Bilirubin: 0.7 mg/dL (ref 0.3–1.2)
Total Protein: 5.4 g/dL — ABNORMAL LOW (ref 6.5–8.1)

## 2019-07-07 LAB — TYPE AND SCREEN
ABO/RH(D): A POS
Antibody Screen: NEGATIVE

## 2019-07-07 LAB — CBC WITH DIFFERENTIAL/PLATELET
Abs Immature Granulocytes: 0.15 10*3/uL — ABNORMAL HIGH (ref 0.00–0.07)
Basophils Absolute: 0 10*3/uL (ref 0.0–0.1)
Basophils Relative: 0 %
Eosinophils Absolute: 0 10*3/uL (ref 0.0–0.5)
Eosinophils Relative: 0 %
HCT: 30.2 % — ABNORMAL LOW (ref 39.0–52.0)
Hemoglobin: 9.2 g/dL — ABNORMAL LOW (ref 13.0–17.0)
Immature Granulocytes: 1 %
Lymphocytes Relative: 5 %
Lymphs Abs: 0.6 10*3/uL — ABNORMAL LOW (ref 0.7–4.0)
MCH: 28.1 pg (ref 26.0–34.0)
MCHC: 30.5 g/dL (ref 30.0–36.0)
MCV: 92.4 fL (ref 80.0–100.0)
Monocytes Absolute: 0.4 10*3/uL (ref 0.1–1.0)
Monocytes Relative: 3 %
Neutro Abs: 9.7 10*3/uL — ABNORMAL HIGH (ref 1.7–7.7)
Neutrophils Relative %: 91 %
Platelets: 161 10*3/uL (ref 150–400)
RBC: 3.27 MIL/uL — ABNORMAL LOW (ref 4.22–5.81)
RDW: 14.8 % (ref 11.5–15.5)
WBC: 10.8 10*3/uL — ABNORMAL HIGH (ref 4.0–10.5)
nRBC: 0 % (ref 0.0–0.2)

## 2019-07-07 LAB — HEPARIN ANTI-XA: Heparin LMW: 0.49 IU/mL

## 2019-07-07 LAB — HEMOGLOBIN AND HEMATOCRIT, BLOOD
HCT: 31.5 % — ABNORMAL LOW (ref 39.0–52.0)
Hemoglobin: 9.6 g/dL — ABNORMAL LOW (ref 13.0–17.0)

## 2019-07-07 LAB — D-DIMER, QUANTITATIVE: D-Dimer, Quant: 3.26 ug/mL-FEU — ABNORMAL HIGH (ref 0.00–0.50)

## 2019-07-07 LAB — MAGNESIUM: Magnesium: 2.3 mg/dL (ref 1.7–2.4)

## 2019-07-07 LAB — GLUCOSE, CAPILLARY
Glucose-Capillary: 75 mg/dL (ref 70–99)
Glucose-Capillary: 107 mg/dL — ABNORMAL HIGH (ref 70–99)
Glucose-Capillary: 231 mg/dL — ABNORMAL HIGH (ref 70–99)
Glucose-Capillary: 237 mg/dL — ABNORMAL HIGH (ref 70–99)

## 2019-07-07 LAB — PHOSPHORUS: Phosphorus: 5.2 mg/dL — ABNORMAL HIGH (ref 2.5–4.6)

## 2019-07-07 LAB — FERRITIN: Ferritin: 386 ng/mL — ABNORMAL HIGH (ref 24–336)

## 2019-07-07 LAB — C-REACTIVE PROTEIN: CRP: 3.7 mg/dL — ABNORMAL HIGH (ref <1.0)

## 2019-07-07 MED ORDER — INSULIN GLARGINE 100 UNIT/ML ~~LOC~~ SOLN
65.0000 [IU] | Freq: Two times a day (BID) | SUBCUTANEOUS | Status: DC
  Administered 2019-07-07 – 2019-07-08 (×3): 65 [IU] via SUBCUTANEOUS
  Filled 2019-07-07 (×3): qty 0.65

## 2019-07-07 NOTE — Progress Notes (Signed)
Updated Wife on pt POC and answered questions and concerns.    07/07/19 1446  Family/Significant Other Communication  Family/Significant Other Update Called;Visiting

## 2019-07-07 NOTE — Progress Notes (Addendum)
PROGRESS NOTE    DEBORA SHIRAH  [id number] DOB: 1950/12/31 DOA: 06/18/2019 PCP: Aldine Contes, MD   Brief Narrative:  68 yo with T2DM c/b peripheral neuropathy, OSA, morbid obesity, CKD III, atrial fibrillation, hypertension, chronic pain, anemia, and multiple other medical problems who presented on 12/15 with 1 week of SOB, difficulty sleeping, poor PO intake, and presyncopal symptoms.  He was admitted from the internal medicine clinic.  He's received steroids and remdesivir.  Plan for plasma today.  Assessment & Plan:   Principal Problem:   COVID-19 with multiple comorbidities Active Problems:   Diabetes type 2, uncontrolled ([place])   Morbid obesity (HCC)   Hypertension   Paroxysmal atrial fibrillation with RVR (HCC)   Respiratory failure with hypoxia (HCC)   Pneumonia due to COVID-19 virus  Acute Hypoxic Respiratory Failure 2/2 COVID 19 Pneumonia:  Continues on 5-6 L [place], continue weaning O2 as tolerated Completed steroids, remdesivr Convalescent plasma 12/19 Elevated procalcitonin on presentation, so actemra was not given.  He's improving over last several days. I/O, daily weights - hold off on diuresis given kidney function Prone as able, IS Inflammatory markers elevated - CRP improved today.  D dimer remains elevated, but starting to trend down. CXR 12/22 with multifocal airspace opacity bilaterally   COVID-19 Labs  Recent Labs    07/05/19 0111 07/06/19 0230 07/07/19 1118  DDIMER 11.96* 7.46*  --   FERRITIN 466* 535* 386*  CRP 2.7* 1.7* 3.7*    Lab Results  Component Value Date   SARSCOV2NAA POSITIVE ([place]) 06/24/2019   [place] NEGATIVE 01/24/2019   Left Chest Wall Hematoma:  CT chest with diffuse edema and enlargement of L pectoralis muscle concerning for hematoma.  Hold anticoagulation.  Hb downtrending, follow.  Elevated D dimer: while on eliquis, he did miss some days while he was sick.  Transition to full dose lovenox and follow LE [place] (pending)  and echo (normal RV systolic function, moderately elevated PASP, low normal EF - see report).  D dimer remains significantly elevated, but starting to downtrend. If D dimer remains elevated, consider repeat LE [place] vs chest imaging for PE Anticoagulation currently on hold due to above, follow.  Atrial Fibrillation with RVR: continue metoprolol PO with IV metop prn for sustained HR over 120.  Follow EKG (NSR).  Anticoagulation on hold.  AKI on CKD III: baseline creatinine 1.5, presented with creatinine of 3.  ACE on hold.  Recently creatinine has been fluctuating.  1.9 over past 2 days.  Follow UA and urine sodium, consider renal US.  Elevated LFT's: Improved.  Acute hepatitis panel (negat9ve).    Type 2 DM  Diabetic Neuropathy:  He's on U500 insulin at home, gets this via [employer] pump.  Says he uses about 70 units of U500 daily (which is similar to what the med rec says) -> that's more like ~350 units of U100 insulin.   Hypoglycemia per RN this AM.  Decrease basal and follow.  Mealtime insulin.  Tradjenta.  Adjust as needed.   Gabapentin for neuropathy  HTN  [name]: continue metop.  Start amlodipine with elevated BP's, increase to 10 mg.  Ace on hold as well as lasix due to kidney function.  Continue atorva.  BP improved.    OSA: CPAP on hold given COVID 19 infection  Insomnia: trazodone prn qhs  Thrombocytopenia: resolved  Glaucoma: continue home eye drops  DVT prophylaxis: lovenox Code Status: full Family Communication: none at bedside - discussed with wife 12/25 Disposition Plan: pending  Consultants:   PCCM  Procedures:  Echo IMPRESSIONS    1. Left ventricular ejection fraction, by visual estimation, is 50 to 55%. The left ventricle has low normal function. There is mildly increased left ventricular hypertrophy.  2. The left ventricle has no regional wall motion abnormalities.  3. Global right ventricle has normal systolic function.The right ventricular size is not well  visualized. No increase in right ventricular wall thickness.  4. Left atrial size was normal.  5. Right atrial size was normal.  6. Small pericardial effusion.  7. The pericardial effusion is circumferential.  8. The mitral valve is normal in structure. Trivial mitral valve regurgitation.  9. The tricuspid valve is normal in structure. Tricuspid valve regurgitation is trivial. 10. The aortic valve is tricuspid. Aortic valve regurgitation is not visualized. No evidence of aortic valve sclerosis or stenosis. 11. The pulmonic valve was grossly normal. Pulmonic valve regurgitation is trivial. 12. Moderately elevated pulmonary artery systolic pressure. 13. The atrial septum is grossly normal.  LE [place]   Summary: Right: There is no evidence of deep vein thrombosis in the lower extremity. However, portions of this examination were limited- see technologist comments above. Left: There is no evidence of deep vein thrombosis in the lower extremity. However, portions of this examination were limited- see technologist comments above.   *See table(s) above for measurements and observations.  Antimicrobials:  Anti-infectives (From admission, onward)   Start     Dose/Rate Route Frequency Ordered Stop   06/28/19 2100  remdesivir 100 mg in sodium chloride 0.9 % 100 mL IVPB     100 mg 200 mL/hr over 30 Minutes Intravenous Daily 07/04/2019 2050 07/01/19 2107   06/28/2019 2100  remdesivir 200 mg in sodium chloride 0.9% 250 mL IVPB     200 mg 580 mL/hr over 30 Minutes Intravenous Once 06/29/2019 2050 06/28/19 0823     Subjective: CP is better.  Objective: Vitals:   07/07/19 0000 07/07/19 0346 07/07/19 0753 07/07/19 1219  BP: 128/71 (!) 163/71 (!) 142/70 (!) 159/90  Pulse: 72 70 73 84  Resp: 20 16 15 18   Temp: 98.3 F (36.8 C) 97.8 F (36.6 C) 97.6 F (36.4 C) 98.5 F (36.9 C)  TempSrc: Oral Oral Oral Oral  SpO2: 93% 92% 91%   Weight:      Height:        Intake/Output Summary (Last 24 hours) at  07/07/2019 1448 Last data filed at 07/07/2019 0100 Gross per 24 hour  Intake 222 ml  Output 800 ml  Net -578 ml   Filed Weights   06/28/19 0443 06/30/19 1900  Weight: (!) 182.4 kg (!) 182 kg    Examination:  General: No acute distress. Cardiovascular: RRR Lungs: unlabored, CTAB Abdomen: Soft, nontender, nondistended Neurological: Alert and oriented 3. Moves all extremities 4 . Cranial nerves II through XII grossly intact. Skin: Warm and dry. No rashes or lesions. Extremities: No clubbing or cyanosis. No edema.    Data Reviewed: I have personally reviewed following labs and imaging studies  CBC: Recent Labs  Lab 07/03/19 0316 07/04/19 0537 07/05/19 0111 07/06/19 0230 07/06/19 1730 07/07/19 1118  WBC 11.3* 11.2* 10.8* 11.1*  --  10.8*  NEUTROABS 9.8* 9.2* 9.1* 9.9*  --  9.7*  HGB 11.6* 12.0* 11.7* 11.1* 10.7* 9.2*  HCT 37.2* 38.5* 37.8* 37.1* 34.8* 30.2*  MCV 89.6 90.4 89.6 92.8  --  92.4  PLT 144* 180 191 188  --  [id number]   Basic Metabolic Panel: Recent Labs  Lab 07/03/19 0316 07/04/19 0537 07/05/19 0111 07/06/19 0230 07/07/19 1118  NA 137 141 141 141 139  K 4.3 4.6 4.7 4.5 4.4  CL 101 105 107 103 104  CO2 23 25 25 27 27   GLUCOSE 322* 249* 233* 270* 123*  BUN 62* 52* 50* 58* 53*  CREATININE 1.96* 1.84* 1.77* 1.98* 1.95*  CALCIUM 8.1* 8.2* 8.3* 8.3* 7.9*  MG 2.3 2.4 2.3 2.4 2.3  PHOS  --   --  4.1 5.6* 5.2*   GFR: Estimated Creatinine Clearance: 64.5 mL/min (Derin Granquist) (by C-G formula based on SCr of 1.95 mg/dL (H)). Liver Function Tests: Recent Labs  Lab 07/03/19 0316 07/04/19 0537 07/05/19 0111 07/06/19 0230 07/07/19 1118  AST 40 36 36 39 31  ALT 38 34 32 49* 34  ALKPHOS 165* 137* 118 111 80  BILITOT 1.3* 1.4* 1.1 1.0 0.7  PROT 6.3* 6.0* 5.7* 5.7* 5.4*  ALBUMIN 2.3* 2.4* 2.3* 2.3* 2.3*   No results for input(s): LIPASE, AMYLASE in the last 168 hours. No results for input(s): AMMONIA in the last 168 hours. Coagulation Profile: Recent Labs  Lab  07/06/19 2200  INR 1.2   Cardiac Enzymes: No results for input(s): CKTOTAL, CKMB, CKMBINDEX, TROPONINI in the last 168 hours. BNP (last 3 results) No results for input(s): PROBNP in the last 8760 hours. HbA1C: No results for input(s): HGBA1C in the last 72 hours. CBG: Recent Labs  Lab 07/06/19 1155 07/06/19 1639 07/06/19 2116 07/07/19 0819 07/07/19 1212  GLUCAP 114* 173* 256* 75 107*   Lipid Profile: No results for input(s): CHOL, HDL, LDLCALC, TRIG, CHOLHDL, LDLDIRECT in the last 72 hours. Thyroid Function Tests: No results for input(s): TSH, T4TOTAL, FREET4, T3FREE, THYROIDAB in the last 72 hours. Anemia Panel: Recent Labs    07/06/19 0230 07/07/19 1118  FERRITIN 535* 386*   Sepsis Labs: Recent Labs  Lab 07/01/19 0636 07/02/19 0445 07/03/19 0316  PROCALCITON 0.86 0.81 0.65    Recent Results (from the past 240 hour(s))  MRSA PCR Screening     Status: None   Collection Time: 06/28/19  1:35 PM   Specimen: Nasopharyngeal  Result Value Ref Range Status   MRSA by PCR NEGATIVE NEGATIVE Final    Comment:        The GeneXpert MRSA Assay (FDA approved for NASAL specimens only), is one component of [facility] comprehensive MRSA colonization surveillance program. It is not intended to diagnose MRSA infection nor to guide or monitor treatment for MRSA infections. Performed at [facility], [place] [address] [place], [place] [address]          Radiology Studies: CT CHEST WO CONTRAST  Result Date: 07/06/2019 CLINICAL DATA:  Soft tissue swelling LEFT chest wall; history COVID-19, stage III chronic kidney disease, hypertension, type II diabetes mellitus EXAM: CT CHEST WITHOUT CONTRAST TECHNIQUE: Multidetector CT imaging of the chest was performed following the standard protocol without IV contrast. Sagittal and coronal MPR images reconstructed from axial data set. Examination limited by respiratory motion artifacts. COMPARISON:  None FINDINGS: Cardiovascular: Mild  atherosclerotic calcification aorta and coronary arteries. Heart normal size. Minimal pericardial effusion. Mediastinum/Nodes: Base of cervical region normal appearance. Air-filled esophagus throughout its length. No thoracic adenopathy. Lungs/Pleura: Patchy airspace infiltrates bilaterally consistent with multifocal pneumonia and COVID-19. Lung assessment suboptimal due to respiratory motion artifacts. No gross mass, pleural effusion, or pneumothorax. Upper Abdomen: Suboptimal assessment of upper abdomen due to respiratory motion. No gross upper abdominal abnormality seen. Musculoskeletal: Degenerative disc disease changes thoracic spine.  Degenerative changes of sternoclavicular and acromioclavicular joints. Old healed LEFT rib fractures. No acute osseous findings. Diffuse soft tissue swelling identified at the LEFT pectoralis major and minor muscles. Particularly enlarged LEFT pectoralis minor with intermediate attenuation question hematoma. Infiltrative changes are seen surrounding the pectoralis muscles extending in the subcutaneous soft tissue of the LEFT chest wall as well as the retropectoral and interpectoral fat. Infiltrative changes extend down the lateral LEFT chest wall at the level of the diaphragm. No soft tissue gas, calcification or radiopaque foreign bodies. IMPRESSION: Patchy BILATERAL airspace infiltrates consistent with multifocal pneumonia and COVID-19. Minimal pericardial effusion. Diffuse edema and enlargement of the LEFT pectoralis muscles especially pectoralis minor with surrounding infiltrative changes extending into the LEFT lateral chest wall. Favor the above changes being related to chest wall hemorrhage/hematoma though unable to exclude infection by CT. Underlying mass not completely excluded; recommend follow-up until resolution. Aortic Atherosclerosis (ICD10-I70.0). Electronically Signed   By: Lavonia Dana M.D.   On: 07/06/2019 19:11        Scheduled Meds: . amLODipine  10 mg  Oral Daily  . amLODipine  5 mg Oral Once  . atorvastatin  20 mg Oral q1800  . Chlorhexidine Gluconate Cloth  6 each Topical Daily  . cycloSPORINE  1 drop Both Eyes Daily  . gabapentin  300 mg Oral QHS  . insulin aspart  0-20 Units Subcutaneous TID WC  . insulin aspart  0-5 Units Subcutaneous QHS  . insulin aspart  14 Units Subcutaneous TID WC  . insulin glargine  65 Units Subcutaneous BID  . latanoprost  1 drop Both Eyes QHS  . linagliptin  5 mg Oral Daily  . mouth rinse  15 mL Mouth Rinse BID  . metoprolol tartrate  12.5 mg Oral Q6H  . pantoprazole  40 mg Oral Daily  . sodium chloride flush  3 mL Intravenous Q12H   Continuous Infusions: . sodium chloride Stopped (07/01/19 0355)     LOS: 10 days    Time spent: over 30 min.    Fayrene Helper, MD [facility] Pager AMION  If 7PM-7AM, please contact night-coverage [email or web address] Password [facility] 07/07/2019, 2:48 PM

## 2019-07-08 LAB — COMPREHENSIVE METABOLIC PANEL
ALT: 34 U/L (ref 0–44)
AST: 28 U/L (ref 15–41)
Albumin: 2.2 g/dL — ABNORMAL LOW (ref 3.5–5.0)
Alkaline Phosphatase: 74 U/L (ref 38–126)
Anion gap: 10 (ref 5–15)
BUN: 46 mg/dL — ABNORMAL HIGH (ref 8–23)
CO2: 26 mmol/L (ref 22–32)
Calcium: 8.2 mg/dL — ABNORMAL LOW (ref 8.9–10.3)
Chloride: 105 mmol/L (ref 98–111)
Creatinine, Ser: 1.87 mg/dL — ABNORMAL HIGH (ref 0.61–1.24)
GFR calc Af Amer: 42 mL/min — ABNORMAL LOW (ref >60)
GFR calc non Af Amer: 36 mL/min — ABNORMAL LOW (ref >60)
Glucose, Bld: 57 mg/dL — ABNORMAL LOW (ref 70–99)
Potassium: 4.1 mmol/L (ref 3.5–5.1)
Sodium: 141 mmol/L (ref 135–145)
Total Bilirubin: 0.8 mg/dL (ref 0.3–1.2)
Total Protein: 5.3 g/dL — ABNORMAL LOW (ref 6.5–8.1)

## 2019-07-08 LAB — CBC WITH DIFFERENTIAL/PLATELET
Abs Immature Granulocytes: 0.15 10*3/uL — ABNORMAL HIGH (ref 0.00–0.07)
Basophils Absolute: 0 10*3/uL (ref 0.0–0.1)
Basophils Relative: 0 %
Eosinophils Absolute: 0.2 10*3/uL (ref 0.0–0.5)
Eosinophils Relative: 1 %
HCT: 28.9 % — ABNORMAL LOW (ref 39.0–52.0)
Hemoglobin: 9 g/dL — ABNORMAL LOW (ref 13.0–17.0)
Immature Granulocytes: 1 %
Lymphocytes Relative: 14 %
Lymphs Abs: 1.7 10*3/uL (ref 0.7–4.0)
MCH: 28.3 pg (ref 26.0–34.0)
MCHC: 31.1 g/dL (ref 30.0–36.0)
MCV: 90.9 fL (ref 80.0–100.0)
Monocytes Absolute: 0.5 10*3/uL (ref 0.1–1.0)
Monocytes Relative: 4 %
Neutro Abs: 10.1 10*3/uL — ABNORMAL HIGH (ref 1.7–7.7)
Neutrophils Relative %: 80 %
Platelets: 171 10*3/uL (ref 150–400)
RBC: 3.18 MIL/uL — ABNORMAL LOW (ref 4.22–5.81)
RDW: 14.9 % (ref 11.5–15.5)
WBC: 12.7 10*3/uL — ABNORMAL HIGH (ref 4.0–10.5)
nRBC: 0.2 % (ref 0.0–0.2)

## 2019-07-08 LAB — C-REACTIVE PROTEIN: CRP: 7.2 mg/dL — ABNORMAL HIGH (ref <1.0)

## 2019-07-08 LAB — GLUCOSE, CAPILLARY
Glucose-Capillary: 191 mg/dL — ABNORMAL HIGH (ref 70–99)
Glucose-Capillary: 156 mg/dL — ABNORMAL HIGH (ref 70–99)
Glucose-Capillary: 123 mg/dL — ABNORMAL HIGH (ref 70–99)

## 2019-07-08 LAB — D-DIMER, QUANTITATIVE: D-Dimer, Quant: 3.36 ug/mL-FEU — ABNORMAL HIGH (ref 0.00–0.50)

## 2019-07-08 LAB — FERRITIN: Ferritin: 355 ng/mL — ABNORMAL HIGH (ref 24–336)

## 2019-07-08 LAB — MAGNESIUM: Magnesium: 2.1 mg/dL (ref 1.7–2.4)

## 2019-07-08 LAB — PHOSPHORUS: Phosphorus: 4.2 mg/dL (ref 2.5–4.6)

## 2019-07-08 MED ORDER — INSULIN GLARGINE 100 UNIT/ML ~~LOC~~ SOLN: 55.0000 [IU] | Freq: Two times a day (BID) | SUBCUTANEOUS | Status: DC

## 2019-07-08 MED ORDER — INSULIN GLARGINE 100 UNIT/ML ~~LOC~~ SOLN
50.0000 [IU] | Freq: Two times a day (BID) | SUBCUTANEOUS | Status: DC
  Administered 2019-07-09: 50 [IU] via SUBCUTANEOUS
  Filled 2019-07-08 (×2): qty 0.5

## 2019-07-08 MED ORDER — INSULIN GLARGINE 100 UNIT/ML ~~LOC~~ SOLN
35.0000 [IU] | Freq: Once | SUBCUTANEOUS | Status: DC
  Filled 2019-07-08: qty 0.35

## 2019-07-08 MED ORDER — METOPROLOL TARTRATE 25 MG PO TABS
12.5000 mg | ORAL_TABLET | Freq: Once | ORAL | Status: AC
  Administered 2019-07-08: 12.5 mg via ORAL
  Filled 2019-07-08: qty 1

## 2019-07-08 MED ORDER — METOPROLOL TARTRATE 25 MG PO TABS
25.0000 mg | ORAL_TABLET | Freq: Four times a day (QID) | ORAL | Status: DC
  Administered 2019-07-08 – 2019-07-12 (×15): 25 mg via ORAL
  Filled 2019-07-08 (×15): qty 1

## 2019-07-08 MED ORDER — INSULIN GLARGINE 100 UNIT/ML ~~LOC~~ SOLN
55.0000 [IU] | Freq: Two times a day (BID) | SUBCUTANEOUS | Status: DC
  Filled 2019-07-08: qty 0.55

## 2019-07-08 NOTE — Progress Notes (Signed)
MD notified of MEWS change from green to yellow due to elevated HR after ambulating from chair to bed 1 hour ago. PRN IV Lopressor given x2 along with  scheduled metop. Pt remains asymptomatic and appears to be resting comfortably. Will continue to monitor.

## 2019-07-08 NOTE — Progress Notes (Addendum)
PROGRESS NOTE    [name]  [id number] DOB: January 17, 1951 DOA: 07/07/2019 PCP: Aldine Contes, MD   Brief Narrative:  68 yo with T2DM c/b peripheral neuropathy, OSA, morbid obesity, CKD III, atrial fibrillation, hypertension, chronic pain, anemia, and multiple other medical problems who presented on 12/15 with 1 week of SOB, difficulty sleeping, poor PO intake, and presyncopal symptoms.  He was admitted from the internal medicine clinic.   He was initially admitted to the ICU on heated high flow.  He's improved now requiring only 5 L after plasma, steroids, and remdesivir.  Hospitalization was complicated by [place] L chest wall hematoma and anticoagulation is currently on hold.  Assessment & Plan:   Principal Problem:   COVID-19 with multiple comorbidities Active Problems:   Diabetes type 2, uncontrolled ([place])   Morbid obesity ([place])   Hypertension   Paroxysmal atrial fibrillation with RVR (HCC)   Respiratory failure with hypoxia (HCC)   Pneumonia due to COVID-19 virus  Acute Hypoxic Respiratory Failure 2/2 COVID 19 Pneumonia:  Weaned to 3 L [place] today, continue to monitor and wean as tolerated Completed steroids, remdesivr Convalescent plasma 12/19 I/O, daily weights - hold off on diuresis given kidney function Prone as able, IS Inflammatory markers elevated - CRP starting to slowly rise, follow closely CXR 12/22 with multifocal airspace opacity bilaterally   COVID-19 Labs  Recent Labs    07/06/19 0230 07/07/19 1118 07/08/19 0455  DDIMER 7.46* 3.26* 3.36*  FERRITIN 535* 386* 355*  CRP 1.7* 3.7* 7.2*    Lab Results  Component Value Date   SARSCOV2NAA POSITIVE (Daryan Buell) 06/29/2019   [place] NEGATIVE 01/24/2019   Left Chest Wall Hematoma:  CT chest with diffuse edema and enlargement of L pectoralis muscle concerning for hematoma.  Hold anticoagulation.  Consider holding for at least 1 week.  Hb downtrending, follow.  Elevated D dimer: while on eliquis, he did miss  some days while he was sick.  He was transitioned to full dose lovenox.  LE [place] was negative for DVT and echo was as follows (normal RV systolic function, moderately elevated PASP, low normal EF - see report).  D dimer downtrending now. If D dimer remains elevated or rises again, consider repeat LE [place] vs chest imaging for PE (probably VQ scan given renal function) Anticoagulation currently on hold due to above, follow.  Atrial Fibrillation with RVR: continue metoprolol PO with IV metop prn for sustained HR over 120.  Follow EKG (NSR).  Anticoagulation on hold.  Metop dose increased today due to RVR.  AKI on CKD III: baseline creatinine 1.5, presented with creatinine of 3.  ACE on hold.  Recently creatinine has been fluctuating.  ~1.9 the past few days.  Good UOP.  Follow UA and urine sodium, consider renal US.  Elevated LFT's: Improved.  Acute hepatitis panel (negat9ve).    Type 2 DM  Diabetic Neuropathy:  He's on U500 insulin at home, gets this via [employer] pump.  Says he uses about 70 units of U500 daily (which is similar to what the med rec says) -> that's more like ~350 units of U100 insulin.   Hypoglycemia per RN this AM.  Decrease basal insulin and follow.  Mealtime insulin.  Tradjenta.  Adjust as needed.   Gabapentin for neuropathy  HTN  [name]: continue metop.  Start amlodipine with elevated BP's, increase to 10 mg.  Ace on hold as well as lasix due to kidney function.  Continue atorva.  BP improved.    OSA: CPAP  on hold given COVID 19 infection  Insomnia: trazodone prn qhs  Thrombocytopenia: resolved  Glaucoma: continue home eye drops  DVT prophylaxis: lovenox Code Status: full Family Communication: 12/26 wife Disposition Plan: pending   Consultants:   PCCM  Procedures:  Echo IMPRESSIONS    1. Left ventricular ejection fraction, by visual estimation, is 50 to 55%. The left ventricle has low normal function. There is mildly increased left ventricular hypertrophy.  2. The left  ventricle has no regional wall motion abnormalities.  3. Global right ventricle has normal systolic function.The right ventricular size is not well visualized. No increase in right ventricular wall thickness.  4. Left atrial size was normal.  5. Right atrial size was normal.  6. Small pericardial effusion.  7. The pericardial effusion is circumferential.  8. The mitral valve is normal in structure. Trivial mitral valve regurgitation.  9. The tricuspid valve is normal in structure. Tricuspid valve regurgitation is trivial. 10. The aortic valve is tricuspid. Aortic valve regurgitation is not visualized. No evidence of aortic valve sclerosis or stenosis. 11. The pulmonic valve was grossly normal. Pulmonic valve regurgitation is trivial. 12. Moderately elevated pulmonary artery systolic pressure. 13. The atrial septum is grossly normal.  LE [place]   Summary: Right: There is no evidence of deep vein thrombosis in the lower extremity. However, portions of this examination were limited- see technologist comments above. Left: There is no evidence of deep vein thrombosis in the lower extremity. However, portions of this examination were limited- see technologist comments above.   *See table(s) above for measurements and observations.  Antimicrobials:  Anti-infectives (From admission, onward)   Start     Dose/Rate Route Frequency Ordered Stop   06/28/19 2100  remdesivir 100 mg in sodium chloride 0.9 % 100 mL IVPB     100 mg 200 mL/hr over 30 Minutes Intravenous Daily 06/24/2019 2050 07/01/19 2107   06/19/2019 2100  remdesivir 200 mg in sodium chloride 0.9% 250 mL IVPB     200 mg 580 mL/hr over 30 Minutes Intravenous Once 07/09/2019 2050 06/28/19 0823     Subjective: No new complaints  Objective: Vitals:   07/08/19 1556 07/08/19 1626 07/08/19 1656 07/08/19 1700  BP: (!) 147/98 (!) 162/73 (!) 153/75 (!) 166/71  Pulse: 94 (!) 118 (!) 119 64  Resp: 19 14 17 17   Temp:      TempSrc:      SpO2: 90%  91% 92% 91%  Weight:      Height:        Intake/Output Summary (Last 24 hours) at 07/08/2019 1949 Last data filed at 07/08/2019 1853 Gross per 24 hour  Intake 1920 ml  Output 2225 ml  Net -305 ml   Filed Weights   06/28/19 0443 06/30/19 1900  Weight: (!) 182.4 kg (!) 182 kg    Examination:  General: No acute distress. Cardiovascular: RRR, L chest wall swelling improved Lungs: Clear to auscultation bilaterally Abdomen: Soft, nontender, nondistended  Neurological: Alert and oriented 3. Moves all extremities 4. Cranial nerves II through XII grossly intact. Skin: Warm and dry. No rashes or lesions. Extremities: No clubbing or cyanosis. No edema.    Data Reviewed: I have personally reviewed following labs and imaging studies  CBC: Recent Labs  Lab 07/04/19 0537 07/05/19 0111 07/06/19 0230 07/06/19 1730 07/07/19 1118 07/07/19 1515 07/08/19 0455  WBC 11.2* 10.8* 11.1*  --  10.8*  --  12.7*  NEUTROABS 9.2* 9.1* 9.9*  --  9.7*  --  10.1*  HGB 12.0* 11.7* 11.1* 10.7* 9.2* 9.6* 9.0*  HCT 38.5* 37.8* 37.1* 34.8* 30.2* 31.5* 28.9*  MCV 90.4 89.6 92.8  --  92.4  --  90.9  PLT 180 191 188  --  161  --  [id number]   Basic Metabolic Panel: Recent Labs  Lab 07/04/19 0537 07/05/19 0111 07/06/19 0230 07/07/19 1118 07/08/19 0455  NA 141 141 141 139 141  K 4.6 4.7 4.5 4.4 4.1  CL 105 107 103 104 105  CO2 25 25 27 27 26   GLUCOSE 249* 233* 270* 123* 57*  BUN 52* 50* 58* 53* 46*  CREATININE 1.84* 1.77* 1.98* 1.95* 1.87*  CALCIUM 8.2* 8.3* 8.3* 7.9* 8.2*  MG 2.4 2.3 2.4 2.3 2.1  PHOS  --  4.1 5.6* 5.2* 4.2   GFR: Estimated Creatinine Clearance: 67.3 mL/min (Eula Jaster) (by C-G formula based on SCr of 1.87 mg/dL (H)). Liver Function Tests: Recent Labs  Lab 07/04/19 0537 07/05/19 0111 07/06/19 0230 07/07/19 1118 07/08/19 0455  AST 36 36 39 31 28  ALT 34 32 49* 34 34  ALKPHOS 137* 118 111 80 74  BILITOT 1.4* 1.1 1.0 0.7 0.8  PROT 6.0* 5.7* 5.7* 5.4* 5.3*  ALBUMIN 2.4* 2.3* 2.3*  2.3* 2.2*   No results for input(s): LIPASE, AMYLASE in the last 168 hours. No results for input(s): AMMONIA in the last 168 hours. Coagulation Profile: Recent Labs  Lab 07/06/19 2200  INR 1.2   Cardiac Enzymes: No results for input(s): CKTOTAL, CKMB, CKMBINDEX, TROPONINI in the last 168 hours. BNP (last 3 results) No results for input(s): PROBNP in the last 8760 hours. HbA1C: No results for input(s): HGBA1C in the last 72 hours. CBG: Recent Labs  Lab 07/07/19 1212 07/07/19 1701 07/07/19 2042 07/08/19 1117 07/08/19 1654  GLUCAP 107* 231* 237* 191* 156*   Lipid Profile: No results for input(s): CHOL, HDL, LDLCALC, TRIG, CHOLHDL, LDLDIRECT in the last 72 hours. Thyroid Function Tests: No results for input(s): TSH, T4TOTAL, FREET4, T3FREE, THYROIDAB in the last 72 hours. Anemia Panel: Recent Labs    07/07/19 1118 07/08/19 0455  FERRITIN 386* 355*   Sepsis Labs: Recent Labs  Lab 07/02/19 0445 07/03/19 0316  PROCALCITON 0.81 0.65    No results found for this or any previous visit (from the past 240 hour(s)).       Radiology Studies: No results found.      Scheduled Meds: . amLODipine  10 mg Oral Daily  . amLODipine  5 mg Oral Once  . atorvastatin  20 mg Oral q1800  . Chlorhexidine Gluconate Cloth  6 each Topical Daily  . cycloSPORINE  1 drop Both Eyes Daily  . gabapentin  300 mg Oral QHS  . insulin aspart  0-20 Units Subcutaneous TID WC  . insulin aspart  0-5 Units Subcutaneous QHS  . insulin aspart  14 Units Subcutaneous TID WC  . insulin glargine  55 Units Subcutaneous BID  . latanoprost  1 drop Both Eyes QHS  . linagliptin  5 mg Oral Daily  . mouth rinse  15 mL Mouth Rinse BID  . metoprolol tartrate  25 mg Oral Q6H  . pantoprazole  40 mg Oral Daily  . sodium chloride flush  3 mL Intravenous Q12H   Continuous Infusions: . sodium chloride Stopped (07/01/19 0355)     LOS: 11 days    Time spent: over 30 min.    Fayrene Helper, MD [facility] Pager AMION  If 7PM-7AM, please contact night-coverage [email or web address] Password [email or web address]  07/08/2019, 7:49 PM

## 2019-07-08 NOTE — Plan of Care (Signed)

## 2019-07-09 ENCOUNTER — Inpatient Hospital Stay (HOSPITAL_COMMUNITY): Payer: Medicare Other

## 2019-07-09 DIAGNOSIS — IMO0002 Reserved for concepts with insufficient information to code with codable children: Secondary | ICD-10-CM | POA: Diagnosis present

## 2019-07-09 DIAGNOSIS — E1140 Type 2 diabetes mellitus with diabetic neuropathy, unspecified: Secondary | ICD-10-CM | POA: Diagnosis present

## 2019-07-09 DIAGNOSIS — E0842 Diabetes mellitus due to underlying condition with diabetic polyneuropathy: Secondary | ICD-10-CM

## 2019-07-09 DIAGNOSIS — S20212D Contusion of left front wall of thorax, subsequent encounter: Secondary | ICD-10-CM

## 2019-07-09 DIAGNOSIS — I4891 Unspecified atrial fibrillation: Secondary | ICD-10-CM | POA: Diagnosis present

## 2019-07-09 DIAGNOSIS — E1165 Type 2 diabetes mellitus with hyperglycemia: Secondary | ICD-10-CM

## 2019-07-09 DIAGNOSIS — E118 Type 2 diabetes mellitus with unspecified complications: Secondary | ICD-10-CM

## 2019-07-09 DIAGNOSIS — J9601 Acute respiratory failure with hypoxia: Secondary | ICD-10-CM | POA: Diagnosis present

## 2019-07-09 DIAGNOSIS — Z6841 Body Mass Index (BMI) 40.0 and over, adult: Secondary | ICD-10-CM

## 2019-07-09 DIAGNOSIS — R7989 Other specified abnormal findings of blood chemistry: Secondary | ICD-10-CM | POA: Diagnosis present

## 2019-07-09 DIAGNOSIS — I10 Essential (primary) hypertension: Secondary | ICD-10-CM

## 2019-07-09 DIAGNOSIS — R0902 Hypoxemia: Secondary | ICD-10-CM

## 2019-07-09 DIAGNOSIS — G4733 Obstructive sleep apnea (adult) (pediatric): Secondary | ICD-10-CM

## 2019-07-09 DIAGNOSIS — E662 Morbid (severe) obesity with alveolar hypoventilation: Secondary | ICD-10-CM | POA: Diagnosis present

## 2019-07-09 LAB — COMPREHENSIVE METABOLIC PANEL
ALT: 31 U/L (ref 0–44)
AST: 29 U/L (ref 15–41)
Albumin: 2.3 g/dL — ABNORMAL LOW (ref 3.5–5.0)
Alkaline Phosphatase: 66 U/L (ref 38–126)
Anion gap: 15 (ref 5–15)
BUN: 39 mg/dL — ABNORMAL HIGH (ref 8–23)
CO2: 25 mmol/L (ref 22–32)
Calcium: 8.3 mg/dL — ABNORMAL LOW (ref 8.9–10.3)
Chloride: 101 mmol/L (ref 98–111)
Creatinine, Ser: 1.72 mg/dL — ABNORMAL HIGH (ref 0.61–1.24)
GFR calc Af Amer: 46 mL/min — ABNORMAL LOW (ref >60)
GFR calc non Af Amer: 40 mL/min — ABNORMAL LOW (ref >60)
Glucose, Bld: 48 mg/dL — ABNORMAL LOW (ref 70–99)
Potassium: 4.7 mmol/L (ref 3.5–5.1)
Sodium: 141 mmol/L (ref 135–145)
Total Bilirubin: 1.1 mg/dL (ref 0.3–1.2)
Total Protein: 5.8 g/dL — ABNORMAL LOW (ref 6.5–8.1)

## 2019-07-09 LAB — CBC WITH DIFFERENTIAL/PLATELET
Abs Immature Granulocytes: 0.1 10*3/uL — ABNORMAL HIGH (ref 0.00–0.07)
Basophils Absolute: 0 10*3/uL (ref 0.0–0.1)
Basophils Relative: 0 %
Eosinophils Absolute: 0.1 10*3/uL (ref 0.0–0.5)
Eosinophils Relative: 1 %
HCT: 28.3 % — ABNORMAL LOW (ref 39.0–52.0)
Hemoglobin: 8.8 g/dL — ABNORMAL LOW (ref 13.0–17.0)
Immature Granulocytes: 1 %
Lymphocytes Relative: 8 %
Lymphs Abs: 0.8 10*3/uL (ref 0.7–4.0)
MCH: 28.6 pg (ref 26.0–34.0)
MCHC: 31.1 g/dL (ref 30.0–36.0)
MCV: 91.9 fL (ref 80.0–100.0)
Monocytes Absolute: 0.5 10*3/uL (ref 0.1–1.0)
Monocytes Relative: 4 %
Neutro Abs: 9.6 10*3/uL — ABNORMAL HIGH (ref 1.7–7.7)
Neutrophils Relative %: 86 %
Platelets: 148 10*3/uL — ABNORMAL LOW (ref 150–400)
RBC: 3.08 MIL/uL — ABNORMAL LOW (ref 4.22–5.81)
RDW: 15.2 % (ref 11.5–15.5)
WBC: 11.1 10*3/uL — ABNORMAL HIGH (ref 4.0–10.5)
nRBC: 0 % (ref 0.0–0.2)

## 2019-07-09 LAB — GLUCOSE, CAPILLARY
Glucose-Capillary: 55 mg/dL — ABNORMAL LOW (ref 70–99)
Glucose-Capillary: 63 mg/dL — ABNORMAL LOW (ref 70–99)
Glucose-Capillary: 89 mg/dL (ref 70–99)
Glucose-Capillary: 158 mg/dL — ABNORMAL HIGH (ref 70–99)
Glucose-Capillary: 167 mg/dL — ABNORMAL HIGH (ref 70–99)
Glucose-Capillary: 104 mg/dL — ABNORMAL HIGH (ref 70–99)

## 2019-07-09 LAB — D-DIMER, QUANTITATIVE: D-Dimer, Quant: 8.28 ug/mL-FEU — ABNORMAL HIGH (ref 0.00–0.50)

## 2019-07-09 LAB — C-REACTIVE PROTEIN: CRP: 11.2 mg/dL — ABNORMAL HIGH (ref <1.0)

## 2019-07-09 LAB — FERRITIN: Ferritin: 331 ng/mL (ref 24–336)

## 2019-07-09 LAB — PHOSPHORUS: Phosphorus: 4.6 mg/dL (ref 2.5–4.6)

## 2019-07-09 LAB — MAGNESIUM: Magnesium: 2.2 mg/dL (ref 1.7–2.4)

## 2019-07-09 MED ORDER — INSULIN ASPART 100 UNIT/ML ~~LOC~~ SOLN
10.0000 [IU] | Freq: Three times a day (TID) | SUBCUTANEOUS | Status: DC
  Administered 2019-07-09 – 2019-07-19 (×28): 10 [IU] via SUBCUTANEOUS

## 2019-07-09 MED ORDER — HEPARIN (PORCINE) 25000 UT/250ML-% IV SOLN
1700.0000 [IU]/h | INTRAVENOUS | Status: DC
  Administered 2019-07-09: 2000 [IU]/h via INTRAVENOUS
  Administered 2019-07-10: 1900 [IU]/h via INTRAVENOUS
  Administered 2019-07-10: 2000 [IU]/h via INTRAVENOUS
  Administered 2019-07-11: 1700 [IU]/h via INTRAVENOUS
  Filled 2019-07-09 (×4): qty 250

## 2019-07-09 MED ORDER — INSULIN GLARGINE 100 UNIT/ML ~~LOC~~ SOLN
40.0000 [IU] | Freq: Two times a day (BID) | SUBCUTANEOUS | Status: DC
  Administered 2019-07-10: 40 [IU] via SUBCUTANEOUS
  Filled 2019-07-09 (×3): qty 0.4

## 2019-07-09 NOTE — Progress Notes (Signed)
@  0525 CBG:55 gave patient a snack repeat CBG:65. Notified MD and asked for hypoglycemic protocol.  @0612  CBG: 89

## 2019-07-09 NOTE — Progress Notes (Addendum)
PROGRESS NOTE    [name]  [id number] DOB: 05-03-1951 DOA: 06/15/2019 PCP: Aldine Contes, MD   Brief Narrative:  68 yo BM PMHx diabetes type 2 uncontrolled with complication, diabetic peripheral neuropathy, OSA/OHS, morbid obesity, CKD STAGE III, atrial fibrillation, HTN, atrial fibrillation on Eliquis, chronic pain syndrome, anemia, atrial fibrillation on Eliquis and multiple other medical problems   Presented on 12/15 with 1 week of SOB, difficulty sleeping, poor PO intake, and presyncopal symptoms.  He was admitted from the internal medicine clinic.  He's received steroids and remdesivir.  Plan for plasma today.    Subjective: A/O x4, negative CP, negative abdominal pain positive bilateral lower extremity pain acute on chronic.   Assessment & Plan:   Principal Problem:   COVID-19 with multiple comorbidities Active Problems:   Diabetes type 2, uncontrolled ([place])   Morbid obesity ([place])   Hypertension   Paroxysmal atrial fibrillation with RVR (HCC)   Respiratory failure with hypoxia (HCC)   Pneumonia due to COVID-19 virus   Acute respiratory failure with hypoxia (HCC)   Chest wall hematoma, left, subsequent encounter   Elevated d-dimer   Atrial fibrillation with RVR ([place])   Diabetes mellitus type 2, uncontrolled, with complications ([place])   Diabetic neuropathy ([place])   Obesity hypoventilation syndrome (HCC)   OSA on CPAP   Morbid obesity with BMI of 70 and over, adult ([place])   Covid pneumonia/acute respiratory failure with hypoxia COVID-19 Labs  Recent Labs    07/07/19 1118 07/08/19 0455 07/09/19 0247  DDIMER 3.26* 3.36* 8.28*  FERRITIN 386* 355* 331  CRP 3.7* 7.2* 11.2*    Lab Results  Component Value Date   SARSCOV2NAA POSITIVE (A) 07/08/2019   [place] NEGATIVE 01/24/2019  -Completed course Remdesivir -Complete course Steroids -12/19 transfuse 1 unit Covid convalescent plasma -12/27 D-dimer and CRP trending up.  LEFT Chest Wall  Hematoma  -Unable to appreciate on exam secondary to patient's habitus -12/27 repeat CT chest; continues to show hematoma.  However given patient's rising D-dimer and A. fib will start heparin drip, maintain on low side of therapeutic range. Recent Labs  Lab 07/06/19 1730 07/07/19 1118 07/07/19 1515 07/08/19 0455 07/09/19 0247  HGB 10.7* 9.2* 9.6* 9.0* 8.8*   Elevated D-dimer -Elevated D-dimer while on Eliquis, he did miss some days while he was sick.  He was transitioned to full dose lovenox.   -LE [place] was negative for DVT and echo was as follows (normal RV systolic function, moderately elevated PASP, low normal EF - see report).  -See A. fib  A. fib with RVR -Currently NSR -Continue to hold anticoagulant until CT chest results are in, favor starting patient on heparin -Amlodipine 10 mg daily -Metoprolol 25 mg QID  Essential HTN -See A. fib  Acute on CKD stage III (baseline Cr 1.5) Recent Labs  Lab 07/05/19 0111 07/06/19 0230 07/07/19 1118 07/08/19 0455 07/09/19 0247  CREATININE 1.77* 1.98* 1.95* 1.87* 1.72*  -Continues to trend down though still slightly above baseline. -Continue to hold ACE I -Strict in and out -Daily weight  Elevated LFT -Acute hepatitis panel negative -Resolved  Diabetes type 2 uncontrolled with complication/Diabetic Neuropathy -At home on U500 via pump; Per EMR uses 70 units which is~350 units of U100 insulin -12/27 decrease Lantus 40 units BID -12/27 decrease NovoLog 10 units qac -Resistant SSI -Tradjenta 5 mg daily  [name] -Lipitor 20 mg daily -Lipid panel pending  OSA/OHS -On CPAP at home (on hold here) -See Covid pneumonia   Morbid obesity -12/27  nutrition consult morbidly obese, uncontrolled diabetes  Insomnia -: trazodone prn qhs  Thrombocytopenia: -Resolved  Glaucoma:  -Continue home eye drops     DVT prophylaxis: On hold secondary to left chest wall hematoma Code Status: Full Family Communication:  Disposition  Plan: TBD   Consultants:    Procedures/Significant Events:  12/19 Echo;LVEF=50 to 55%.-negative regional wall abnormality..  -Moderately elevated pulmonary artery systolic pressure. 12/20 bilateral lower extremity Doppler; negative for DVT/SVT 12/24 CT chest Wo contrast;-patchy BILATERAL airspace infiltrates consistent with multifocal pneumonia and COVID-19. -Dffuse edema and enlargement of the LEFT pectoralis muscles especially pectoralis minor with surrounding infiltrative changes extending into the LEFT lateral chest wall. Favor the above changes being related to chest wall hemorrhage/hematoma though unable to exclude infection by CT. -Aortic Atherosclerosis 12/27 CT chest Wo contrast;Interval decrease in size of the left pectoralis minor muscle suggesting resolving edema/hemorrhage. No discrete hematoma at this time. 2. Persistent enlargement of the left pectoralis major muscle with edema/hemorrhage tracking in the subcutaneous fat of the anterior left chest wall, similar to prior. 3. No new findings to suggest interval rebleeding. 4. Similar appearance of the widespread patchy airspace disease compatible with multifocal pneumonia. 5. Hepatic steatosis 6.  Aortic Atherosclerois   I have personally reviewed and interpreted all radiology studies and my findings are as above.  VENTILATOR SETTINGS: HFNC 12/27 Flow; 3 L/min SPO2; 97%    Cultures   Antimicrobials: Anti-infectives (From admission, onward)   Start     Dose/Rate Stop   06/28/19 2100  remdesivir 100 mg in sodium chloride 0.9 % 100 mL IVPB     100 mg 200 mL/hr over 30 Minutes 07/01/19 2107   06/25/2019 2100  remdesivir 200 mg in sodium chloride 0.9% 250 mL IVPB     200 mg 580 mL/hr over 30 Minutes 06/28/19 0823       Devices    LINES / TUBES:      Continuous Infusions: . sodium chloride Stopped (07/01/19 0355)     Objective: Vitals:   07/09/19 0545 07/09/19 0615 07/09/19 0751 07/09/19 1124   BP: (!) 150/80  135/69 (!) 142/64  Pulse: 67 71 79 76  Resp: 16 14 19 18   Temp:   98.7 F (37.1 C) 98.7 F (37.1 C)  TempSrc:   Oral Oral  SpO2: 97% 90% 94% 91%  Weight:      Height:        Intake/Output Summary (Last 24 hours) at 07/09/2019 1519 Last data filed at 07/09/2019 0900 Gross per 24 hour  Intake 720 ml  Output 1175 ml  Net -455 ml   Filed Weights   06/28/19 0443 06/30/19 1900  Weight: (!) 182.4 kg (!) 182 kg    Examination:  General: A/O x4, positive acute respiratory distress Eyes: negative scleral hemorrhage, negative anisocoria, negative icterus ENT: Negative Runny nose, negative gingival bleeding, Neck:  Negative scars, masses, torticollis, lymphadenopathy, JVD Lungs: Difficult to auscultate secondary to body habitus but appears diffusely decreased breath sounds,  without wheezes or crackles Cardiovascular: Regular rate and rhythm without murmur gallop or rub normal S1 and S2 Abdomen: MORBIDLY OBESE, negative abdominal pain, nondistended, positive soft, bowel sounds, no rebound, no ascites, no appreciable mass Extremities: No significant cyanosis, clubbing.  Positive bilateral lower extremities edema with pain to palpation Skin: Negative rashes, lesions, ulcers Psychiatric:  Negative depression, negative anxiety, negative fatigue, negative mania  Central nervous system:  Cranial nerves II through XII intact, tongue/uvula midline, all extremities muscle strength 5/5, sensation intact throughout,  negative dysarthria, negative expressive aphasia, negative receptive aphasia.  .     Data Reviewed: Care during the described time interval was provided by me .  I have reviewed this patient's available data, including medical history, events of note, physical examination, and all test results as part of my evaluation.   CBC: Recent Labs  Lab 07/05/19 0111 07/06/19 0230 07/06/19 1730 07/07/19 1118 07/07/19 1515 07/08/19 0455 07/09/19 0247  WBC 10.8* 11.1*   --  10.8*  --  12.7* 11.1*  NEUTROABS 9.1* 9.9*  --  9.7*  --  10.1* 9.6*  HGB 11.7* 11.1* 10.7* 9.2* 9.6* 9.0* 8.8*  HCT 37.8* 37.1* 34.8* 30.2* 31.5* 28.9* 28.3*  MCV 89.6 92.8  --  92.4  --  90.9 91.9  PLT 191 188  --  161  --  171 [id number]*   Basic Metabolic Panel: Recent Labs  Lab 07/05/19 0111 07/06/19 0230 07/07/19 1118 07/08/19 0455 07/09/19 0247  NA 141 141 139 141 141  K 4.7 4.5 4.4 4.1 4.7  CL 107 103 104 105 101  CO2 25 27 27 26 25   GLUCOSE 233* 270* 123* 57* 48*  BUN 50* 58* 53* 46* 39*  CREATININE 1.77* 1.98* 1.95* 1.87* 1.72*  CALCIUM 8.3* 8.3* 7.9* 8.2* 8.3*  MG 2.3 2.4 2.3 2.1 2.2  PHOS 4.1 5.6* 5.2* 4.2 4.6   GFR: Estimated Creatinine Clearance: 73.2 mL/min (A) (by C-G formula based on SCr of 1.72 mg/dL (H)). Liver Function Tests: Recent Labs  Lab 07/05/19 0111 07/06/19 0230 07/07/19 1118 07/08/19 0455 07/09/19 0247  AST 36 39 31 28 29   ALT 32 49* 34 34 31  ALKPHOS 118 111 80 74 66  BILITOT 1.1 1.0 0.7 0.8 1.1  PROT 5.7* 5.7* 5.4* 5.3* 5.8*  ALBUMIN 2.3* 2.3* 2.3* 2.2* 2.3*   No results for input(s): LIPASE, AMYLASE in the last 168 hours. No results for input(s): AMMONIA in the last 168 hours. Coagulation Profile: Recent Labs  Lab 07/06/19 2200  INR 1.2   Cardiac Enzymes: No results for input(s): CKTOTAL, CKMB, CKMBINDEX, TROPONINI in the last 168 hours. BNP (last 3 results) No results for input(s): PROBNP in the last 8760 hours. HbA1C: No results for input(s): HGBA1C in the last 72 hours. CBG: Recent Labs  Lab 07/09/19 0521 07/09/19 0537 07/09/19 0606 07/09/19 0740 07/09/19 1122  GLUCAP 55* 63* 89 158* 167*   Lipid Profile: No results for input(s): CHOL, HDL, LDLCALC, TRIG, CHOLHDL, LDLDIRECT in the last 72 hours. Thyroid Function Tests: No results for input(s): TSH, T4TOTAL, FREET4, T3FREE, THYROIDAB in the last 72 hours. Anemia Panel: Recent Labs    07/08/19 0455 07/09/19 0247  FERRITIN 355* 331   Urine analysis:      Component Value Date/Time   COLORURINE YELLOW 05/17/2015 1025   APPEARANCEUR CLEAR 05/17/2015 1025   LABSPEC >=1.030 (A) 05/17/2015 1025   PHURINE 5.5 05/17/2015 1025   GLUCOSEU NEGATIVE 05/17/2015 1025   HGBUR MODERATE (A) 05/17/2015 1025   BILIRUBINUR NEGATIVE 05/17/2015 1025   KETONESUR NEGATIVE 05/17/2015 1025   PROTEINUR NEGATIVE 12/13/2014 2120   UROBILINOGEN 0.2 05/17/2015 1025   NITRITE NEGATIVE 05/17/2015 1025   LEUKOCYTESUR NEGATIVE 05/17/2015 1025   Sepsis Labs: @LABRCNTIP (procalcitonin:4,lacticidven:4)  )No results found for this or any previous visit (from the past 240 hour(s)).       Radiology Studies: No results found.      Scheduled Meds: . amLODipine  10 mg Oral Daily  . amLODipine  5 mg Oral Once  .  atorvastatin  20 mg Oral q1800  . Chlorhexidine Gluconate Cloth  6 each Topical Daily  . cycloSPORINE  1 drop Both Eyes Daily  . gabapentin  300 mg Oral QHS  . insulin aspart  0-20 Units Subcutaneous TID WC  . insulin aspart  0-5 Units Subcutaneous QHS  . insulin aspart  10 Units Subcutaneous TID WC  . insulin glargine  40 Units Subcutaneous BID  . latanoprost  1 drop Both Eyes QHS  . linagliptin  5 mg Oral Daily  . mouth rinse  15 mL Mouth Rinse BID  . metoprolol tartrate  25 mg Oral Q6H  . pantoprazole  40 mg Oral Daily  . sodium chloride flush  3 mL Intravenous Q12H   Continuous Infusions: . sodium chloride Stopped (07/01/19 0355)     LOS: 12 days   The patient is critically ill with multiple organ systems failure and requires high complexity decision making for assessment and support, frequent evaluation and titration of therapies, application of advanced monitoring technologies and extensive interpretation of multiple databases. Critical Care Time devoted to patient care services described in this note  Time spent: 40 minutes     Kailon Treese, Geraldo Docker, MD [facility] Pager [phone]  If 7PM-7AM, please contact  night-coverage [email or web address] Password [facility] 07/09/2019, 3:19 PM

## 2019-07-09 NOTE — Progress Notes (Signed)
Notified spouse of progress all questions answered and this nurse's number shared for further communication.  

## 2019-07-09 NOTE — Progress Notes (Signed)
ANTICOAGULATION Consult Note  Pharmacy Consulte: Lovenox>IV UFH Indication: Afib (Eliquis PTA), r/o PE with increased d-dimer  Allergies  Allergen Reactions  . Vancomycin     REACTION: ARF    Patient Measurements: Height: 6\' 6"  (198.1 cm) Weight: (!) 401 lb 3.8 oz (182 kg) IBW/kg (Calculated) : 91.4  Vital Signs: Temp: 97.8 F (36.6 C) (12/27 1653) Temp Source: Oral (12/27 1653) BP: 148/60 (12/27 1653) Pulse Rate: 84 (12/27 1653)  Labs: Recent Labs    07/06/19 2200 07/07/19 1118 07/07/19 1515 07/08/19 0455 07/09/19 0247  HGB  --  9.2* 9.6* 9.0* 8.8*  HCT  --  30.2* 31.5* 28.9* 28.3*  PLT  --  161  --  171 148*  APTT 37*  --   --   --   --   LABPROT 15.0  --   --   --   --   INR 1.2  --   --   --   --   HEPRLOWMOCWT  --  0.49  --   --   --   CREATININE  --  1.95*  --  1.87* 1.72*    Estimated Creatinine Clearance: 73.2 mL/min (A) (by C-G formula based on SCr of 1.72 mg/dL (H)).   Assessment: 68 y.o. M on Eliquis 5mg  BID for PAF. Pt reported to MD about missing several Eliquis doses PTA. Changed to full-dose Lovenox 180mg  SQ q12h for r/o PE. Dopplers negative.Anti-Xa remains > 2 despite holding Lovenox.   Anticoagulation has been on hold for several days due to chest wall hemotoma. However, due to concern with increasing d-dimer and a hx of afib, heparin has been ordered to be resumed with a lower goal. We will do it without bolus. He is morbidly obese.   Goal of Therapy:  Heparin level 0.3-0.5 units/ml Monitor platelets by anticoagulation protocol: Yes   Plan:  Heparin drip at 2000 units/hr F/u with 8 hr heparin level Daily heparin level  Onnie Boer, PharmD, [place], AAHIVP, CPP Infectious Disease Pharmacist 07/09/2019 5:28 PM

## 2019-07-10 ENCOUNTER — Other Ambulatory Visit: Payer: Self-pay | Admitting: Endocrinology

## 2019-07-10 LAB — COMPREHENSIVE METABOLIC PANEL
ALT: 31 U/L (ref 0–44)
AST: 35 U/L (ref 15–41)
Albumin: 2 g/dL — ABNORMAL LOW (ref 3.5–5.0)
Alkaline Phosphatase: 68 U/L (ref 38–126)
Anion gap: 9 (ref 5–15)
BUN: 37 mg/dL — ABNORMAL HIGH (ref 8–23)
CO2: 25 mmol/L (ref 22–32)
Calcium: 8.5 mg/dL — ABNORMAL LOW (ref 8.9–10.3)
Chloride: 104 mmol/L (ref 98–111)
Creatinine, Ser: 1.85 mg/dL — ABNORMAL HIGH (ref 0.61–1.24)
GFR calc Af Amer: 42 mL/min — ABNORMAL LOW (ref >60)
GFR calc non Af Amer: 36 mL/min — ABNORMAL LOW (ref >60)
Glucose, Bld: 108 mg/dL — ABNORMAL HIGH (ref 70–99)
Potassium: 4.8 mmol/L (ref 3.5–5.1)
Sodium: 138 mmol/L (ref 135–145)
Total Bilirubin: 0.8 mg/dL (ref 0.3–1.2)
Total Protein: 5.4 g/dL — ABNORMAL LOW (ref 6.5–8.1)

## 2019-07-10 LAB — GLUCOSE, CAPILLARY
Glucose-Capillary: 60 mg/dL — ABNORMAL LOW (ref 70–99)
Glucose-Capillary: 48 mg/dL — ABNORMAL LOW (ref 70–99)
Glucose-Capillary: 62 mg/dL — ABNORMAL LOW (ref 70–99)
Glucose-Capillary: 66 mg/dL — ABNORMAL LOW (ref 70–99)
Glucose-Capillary: 115 mg/dL — ABNORMAL HIGH (ref 70–99)
Glucose-Capillary: 85 mg/dL (ref 70–99)
Glucose-Capillary: 102 mg/dL — ABNORMAL HIGH (ref 70–99)
Glucose-Capillary: 74 mg/dL (ref 70–99)
Glucose-Capillary: 134 mg/dL — ABNORMAL HIGH (ref 70–99)
Glucose-Capillary: 136 mg/dL — ABNORMAL HIGH (ref 70–99)
Glucose-Capillary: 101 mg/dL — ABNORMAL HIGH (ref 70–99)

## 2019-07-10 LAB — CBC WITH DIFFERENTIAL/PLATELET
Abs Immature Granulocytes: 0.07 10*3/uL (ref 0.00–0.07)
Basophils Absolute: 0 10*3/uL (ref 0.0–0.1)
Basophils Relative: 0 %
Eosinophils Absolute: 0.2 10*3/uL (ref 0.0–0.5)
Eosinophils Relative: 2 %
HCT: 26.5 % — ABNORMAL LOW (ref 39.0–52.0)
Hemoglobin: 8.2 g/dL — ABNORMAL LOW (ref 13.0–17.0)
Immature Granulocytes: 1 %
Lymphocytes Relative: 9 %
Lymphs Abs: 0.8 10*3/uL (ref 0.7–4.0)
MCH: 28.5 pg (ref 26.0–34.0)
MCHC: 30.9 g/dL (ref 30.0–36.0)
MCV: 92 fL (ref 80.0–100.0)
Monocytes Absolute: 0.4 10*3/uL (ref 0.1–1.0)
Monocytes Relative: 5 %
Neutro Abs: 7.4 10*3/uL (ref 1.7–7.7)
Neutrophils Relative %: 83 %
Platelets: 136 10*3/uL — ABNORMAL LOW (ref 150–400)
RBC: 2.88 MIL/uL — ABNORMAL LOW (ref 4.22–5.81)
RDW: 15.5 % (ref 11.5–15.5)
WBC: 8.9 10*3/uL (ref 4.0–10.5)
nRBC: 0 % (ref 0.0–0.2)

## 2019-07-10 LAB — LIPID PANEL
Cholesterol: 85 mg/dL (ref 0–200)
HDL: 28 mg/dL — ABNORMAL LOW (ref >40)
LDL Cholesterol: 35 mg/dL (ref 0–99)
Total CHOL/HDL Ratio: 3 RATIO
Triglycerides: 108 mg/dL (ref <150)
VLDL: 22 mg/dL (ref 0–40)

## 2019-07-10 LAB — FERRITIN: Ferritin: 295 ng/mL (ref 24–336)

## 2019-07-10 LAB — D-DIMER, QUANTITATIVE: D-Dimer, Quant: 10.84 ug/mL-FEU — ABNORMAL HIGH (ref 0.00–0.50)

## 2019-07-10 LAB — PHOSPHORUS: Phosphorus: 3.9 mg/dL (ref 2.5–4.6)

## 2019-07-10 LAB — HEPARIN LEVEL (UNFRACTIONATED)
Heparin Unfractionated: 0.55 IU/mL (ref 0.30–0.70)
Heparin Unfractionated: 0.63 IU/mL (ref 0.30–0.70)

## 2019-07-10 LAB — C-REACTIVE PROTEIN: CRP: 10.7 mg/dL — ABNORMAL HIGH (ref <1.0)

## 2019-07-10 LAB — MAGNESIUM: Magnesium: 1.8 mg/dL (ref 1.7–2.4)

## 2019-07-10 MED ORDER — INSULIN GLARGINE 100 UNIT/ML ~~LOC~~ SOLN
40.0000 [IU] | Freq: Every day | SUBCUTANEOUS | Status: DC
  Administered 2019-07-11 – 2019-07-21 (×11): 40 [IU] via SUBCUTANEOUS
  Filled 2019-07-10 (×11): qty 0.4

## 2019-07-10 MED ORDER — INSULIN ASPART 100 UNIT/ML ~~LOC~~ SOLN
0.0000 [IU] | SUBCUTANEOUS | Status: DC
  Administered 2019-07-10 (×2): 2 [IU] via SUBCUTANEOUS
  Administered 2019-07-11 – 2019-07-12 (×5): 3 [IU] via SUBCUTANEOUS
  Administered 2019-07-13 – 2019-07-14 (×3): 2 [IU] via SUBCUTANEOUS
  Administered 2019-07-15: 21:00:00 3 [IU] via SUBCUTANEOUS
  Administered 2019-07-15: 2 [IU] via SUBCUTANEOUS
  Administered 2019-07-16: 01:00:00 3 [IU] via SUBCUTANEOUS
  Administered 2019-07-16 – 2019-07-17 (×3): 2 [IU] via SUBCUTANEOUS
  Administered 2019-07-17 (×2): 3 [IU] via SUBCUTANEOUS
  Administered 2019-07-18: 17:00:00 2 [IU] via SUBCUTANEOUS
  Administered 2019-07-19 (×2): 3 [IU] via SUBCUTANEOUS

## 2019-07-10 NOTE — Discharge Instructions (Signed)

## 2019-07-10 NOTE — Progress Notes (Signed)
Nutrition Education Note RD working remotely.  RD consulted for nutrition education regarding Diabetes  Unable to provide education bedside, patient on airborne/contact precautions secondary to COVID-10 infection and located at [employer]. Patient would benefit from outpatient nutrition services and recommend [facility] if patient interested.   RD provided "Heart Healthy Consistent Carbohydrate Nutrition Therapy" handout from the Academy of Nutrition and Dietetics. Education has been provided in discharge instructions for patient review. Handout provides examples on ways to decrease sodium and fat intake in diet. Discourages intake of processed foods and use of salt shaker. Encourages fresh fruits and vegetables as well as whole grain sources of carbohydrates to maximize fiber intake.   Body mass index is 46.37 kg/m. Pt meets criteria for morbid obesity based on current BMI.  Lab Results  Component Value Date   HGBA1C 8.2 (A) 06/06/2019    Current diet order is CM, patient is consuming approximately 54% of meals at this time. Labs and medications reviewed. No further nutrition interventions warranted at this time. RD contact information provided. If additional nutrition issues arise, please re-consult RD.  Lajuan Lines, RD, LDN Clinical Nutrition [place] Telephone [phone] After Hours/Weekend Pager: [phone]

## 2019-07-10 NOTE — Progress Notes (Signed)
Physical Therapy Treatment Patient Details Name: [name] MRN: [id number] DOB: [birth year] Today's Date: 07/10/2019    History of Present Illness 68 yo with T2DM c/b peripheral neuropathy, OSA, morbid obesity, CKD III, atrial fibrillation, hypertension, chronic pain, anemia, and multiple other medical problems who presented on 12/15 with 1 week of SOB, difficulty sleeping, poor PO intake, and presyncopal symptoms.  He was admitted from the internal medicine clinic to [facility] then transferred to [facility].     PT Comments    Patient admits he wants to be able to return to his home ambulating household distances (and able to walk up 2 steps onto [facility] bus for MD appointments). He becomes anxious when he feels he can't breathe and repeatedly stated not to rush him. Incr time for all mobility/exercise. MD in during session and encouraged pt to try to walk. He walked 4 ft on 10L HFNC with chair then brought up behind him to sit and rest (pt was on 5L at rest). Sats decr to 72% and slowly (~5 minutes) recovered to 88%. Weaned pt down to 8L and then 6L while he sat and recovered.    Follow Up Recommendations  CIR(continue to monitor if he would qualify)     Equipment Recommendations  Other (comment)(bariatric rolling walker)    Recommendations for Other Services       Precautions / Restrictions Precautions Precautions: Fall Precaution Comments: desats with activity; fell at home PTA Restrictions Weight Bearing Restrictions: No Other Position/Activity Restrictions: wb may be limited by pain in hip    Mobility  Bed Mobility Overal bed mobility: Needs Assistance Bed Mobility: Rolling;Sidelying to Sit Rolling: Min guard Sidelying to sit: HOB elevated;Min guard(HOB 15)       General bed mobility comments: on bariatric air bed with side exit; pt is 6'7" therefore left air in mattress with closeguarding for safety (no physical assist)  Transfers Overall transfer level: Needs  assistance Equipment used: Rolling walker (2 wheeled) Transfers: Sit to/from [facility] Sit to Stand: Min assist;+2 physical assistance         General transfer comment: pt tries to pull up to stand using RW  Ambulation/Gait Ambulation/Gait assistance: Min guard Gait Distance (Feet): 4 Feet Assistive device: Rolling walker (2 wheeled) Gait Pattern/deviations: Step-to pattern;Decreased stride length;Trunk flexed Gait velocity: slow as instructed to help control his dyspnea   General Gait Details: walked towards door and pulled recliner up behind him; he gets very anxious as his dyspnea increases and needs to sit quickly after he says he needs the chair   Stairs             Wheelchair Mobility    Modified Rankin (Stroke Patients Only)       Balance Overall balance assessment: Needs assistance Sitting-balance support: Feet supported Sitting balance-Leahy Scale: Good     Standing balance support: Bilateral upper extremity supported Standing balance-Leahy Scale: Poor Standing balance comment: RELIANT ON EXTERNAL SUPPORT                            Cognition Arousal/Alertness: Awake/alert Behavior During Therapy: WFL for tasks assessed/performed Overall Cognitive Status: No family/caregiver present to determine baseline cognitive functioning                                 General Comments: most likely baseline      Exercises Other Exercises Other  Exercises: IS x 2 breaths for pt to demonstrate correct technique (only draws 750-1051ml); 2 reps with flutter valve with coughing induced Other Exercises: Pt reported his UE exercise band and exercises were lost and re-issued.  Other Exercises: Pt educated to perform seated marches, leg kicks, and heel raises.     General Comments General comments (skin integrity, edema, etc.): Educated on pursed lip breathing with pt demonstrating difficulty with technique; with max cues,  able to get pt to focus just on the exhale through pursed lips and noted pt recovered from low 70s to 85% more quickly (4 minutes compared to 6 minutes)      Pertinent Vitals/Pain Pain Assessment: 0-10 Pain Score: 2  Pain Location: R hip Pain Descriptors / Indicators: Discomfort;Guarding;Aching Pain Intervention(s): Limited activity within patient's tolerance;Monitored during session    Home Living                      Prior Function            PT Goals (current goals can now be found in the care plan section) Acute Rehab PT Goals Patient Stated Goal: to get stronger and go home Time For Goal Achievement: 07/19/19 Potential to Achieve Goals: Good Progress towards PT goals: Progressing toward goals    Frequency    Min 3X/week      PT Plan Current plan remains appropriate    Co-evaluation              AM-PAC PT "6 Clicks" Mobility   Outcome Measure  Help needed turning from your back to your side while in a flat bed without using bedrails?: A Little Help needed moving from lying on your back to sitting on the side of a flat bed without using bedrails?: A Little Help needed moving to and from a bed to a chair (including a wheelchair)?: A Little Help needed standing up from a chair using your arms (e.g., wheelchair or bedside chair)?: A Little Help needed to walk in hospital room?: A Little Help needed climbing 3-5 steps with a railing? : Total 6 Click Score: 16    End of Session Equipment Utilized During Treatment: Oxygen Activity Tolerance: Treatment limited secondary to medical complications (Comment);Patient limited by fatigue Patient left: in chair;with call bell/phone within reach;with chair alarm set Nurse Communication: Mobility status PT Visit Diagnosis: Unsteadiness on feet (R26.81);Other abnormalities of gait and mobility (R26.89);Muscle weakness (generalized) (M62.81)     Time: [id number] PT Time Calculation (min) (ACUTE ONLY): 38  min  Charges:  $Gait Training: 8-22 mins $Therapeutic Exercise: 8-22 mins $Therapeutic Activity: 8-22 mins                      Arby Barrette, PT Pager [phone]    Rexanne Mano 07/10/2019, 2:35 PM

## 2019-07-10 NOTE — Progress Notes (Addendum)
ANTICOAGULATION Consult Note  Pharmacy Consulte: Lovenox>IV UFH Indication: Afib (Eliquis PTA), r/o PE with increased d-dimer  Allergies  Allergen Reactions  . Vancomycin     REACTION: ARF    Patient Measurements: Height: 6\' 6"  (198.1 cm) Weight: (!) 401 lb 3.8 oz (182 kg) IBW/kg (Calculated) : 91.4  Vital Signs: Temp: 98.8 F (37.1 C) (12/27 2332) Temp Source: Oral (12/27 2332) BP: 130/44 (12/27 2332) Pulse Rate: 77 (12/27 2332)  Labs: Recent Labs    07/07/19 1118 07/08/19 0455 07/09/19 0247 07/10/19 0211  HGB 9.2* 9.0* 8.8* 8.2*  HCT 30.2* 28.9* 28.3* 26.5*  PLT 161 171 148* 136*  HEPARINUNFRC  --   --   --  0.55  HEPRLOWMOCWT 0.49  --   --   --   CREATININE 1.95* 1.87* 1.72* 1.85*    Estimated Creatinine Clearance: 68 mL/min (A) (by C-G formula based on SCr of 1.85 mg/dL (H)).   Assessment: 68 y.o. M on Eliquis 5mg  BID for PAF. Pt reported to MD about missing several Eliquis doses PTA. Changed to full-dose Lovenox 180mg  SQ q12h for r/o PE. Dopplers negative.Anti-Xa remains > 2 despite holding Lovenox.   Anticoagulation has been on hold for several days due to chest wall hemotoma. However, due to concern with increasing d-dimer and a hx of afib, heparin has been ordered to be resumed with a lower goal. We will do it without bolus. He is morbidly obese.  Initial heparin level 0.55 units/ml  Hg 8.2, PTLC 136  Goal of Therapy:  Heparin level 0.3-0.5 units/ml Monitor platelets by anticoagulation protocol: Yes   Plan:  Continue heparin drip at 2000 units/hr F/u with 8 hr heparin level to confirm Daily heparin level Thanks for allowing pharmacy to be a part of this patient's care.  Excell Seltzer, PharmD Clinical Pharmacist 07/10/2019 3:51 AM   10:30 am repeat heparin level = 0.63 Decrease heparin slightly to 1900 units / hr Follow up AM labs  Thank you Anette Guarneri, PharmD

## 2019-07-10 NOTE — Care Management Important Message (Signed)
Important Message  Patient Details  Name: [name] MRN: [id number] Date of Birth: 06-Oct-1950   Medicare Important Message Given:  Yes - Important Message mailed due to current National Emergency  Verbal consent obtained due to current National Emergency  Relationship to patient: Spouse/Significant Other Contact Name: Irish Gonyo Call Date: 07/10/19  Time: 1514 Phone: [phone] Outcome: No Answer/Busy Important Message mailed to: Patient address on file    Delorse Lek 07/10/2019, 3:15 PM

## 2019-07-10 NOTE — Progress Notes (Addendum)
PROGRESS NOTE    [name]  [id number] DOB: 04-24-1951 DOA: 07/10/2019 PCP: Aldine Contes, MD   Brief Narrative:  68 yo BM PMHx diabetes type 2 uncontrolled with complication, diabetic peripheral neuropathy, OSA/OHS, morbid obesity, CKD STAGE III, atrial fibrillation, HTN, atrial fibrillation on Eliquis, chronic pain syndrome, anemia, atrial fibrillation on Eliquis and multiple other medical problems   Presented on 12/15 with 1 week of SOB, difficulty sleeping, poor PO intake, and presyncopal symptoms.  He was admitted from the internal medicine clinic.  He's received steroids and remdesivir.  Plan for plasma today.    Subjective: 12/28 last 24 hours afebrile A/O x4, negative CP, negative abdominal pain.    Assessment & Plan:   Principal Problem:   COVID-19 with multiple comorbidities Active Problems:   Diabetes type 2, uncontrolled ([place])   Morbid obesity ([place])   Hypertension   Paroxysmal atrial fibrillation with RVR (HCC)   Respiratory failure with hypoxia (HCC)   Pneumonia due to COVID-19 virus   Acute respiratory failure with hypoxia (HCC)   Chest wall hematoma, left, subsequent encounter   Elevated d-dimer   Atrial fibrillation with RVR ([place])   Diabetes mellitus type 2, uncontrolled, with complications ([place])   Diabetic neuropathy ([place])   Obesity hypoventilation syndrome (HCC)   OSA on CPAP   Morbid obesity with BMI of 70 and over, adult ([place])   Covid pneumonia/acute respiratory failure with hypoxia COVID-19 Labs  Recent Labs    07/08/19 0455 07/09/19 0247 07/10/19 0211  DDIMER 3.36* 8.28* 10.84*  FERRITIN 355* 331 295  CRP 7.2* 11.2* 10.7*    Lab Results  Component Value Date   SARSCOV2NAA POSITIVE (A) 06/21/2019   [place] NEGATIVE 01/24/2019  -Completed course Remdesivir -Complete course Steroids -12/19 transfuse 1 unit Covid convalescent plasma  LEFT Chest Wall Hematoma  -Unable to appreciate on exam secondary to patient's  habitus -12/27 repeat CT chest; continues to show hematoma.  However given patient's rising D-dimer and A. fib will start heparin drip, maintain on low side of therapeutic range. Recent Labs  Lab 07/07/19 1118 07/07/19 1515 07/08/19 0455 07/09/19 0247 07/10/19 0211  HGB 9.2* 9.6* 9.0* 8.8* 8.2*   Elevated D-dimer -Elevated D-dimer while on Eliquis, he did miss some days while he was sick.  He was transitioned to full dose lovenox.   -LE [place] was negative for DVT and echo was as follows (normal RV systolic function, moderately elevated PASP, low normal EF - see report).  -See A. fib  A. fib with RVR -Currently NSR -Amlodipine 10 mg daily -Metoprolol 25 mg QID -12/27 Heparin drip restarted lowest therapeutic dosing per pharmacy  Essential HTN -See A. fib  Acute on CKD stage III (baseline Cr 1.5) Recent Labs  Lab 07/06/19 0230 07/07/19 1118 07/08/19 0455 07/09/19 0247 07/10/19 0211  CREATININE 1.98* 1.95* 1.87* 1.72* 1.85*  -Continues to trend down though still slightly above baseline. -Continue to hold ACE I -Strict in and out -13.7 L -Daily weight Filed Weights   06/28/19 0443 06/30/19 1900  Weight: (!) 182.4 kg (!) 182 kg    Elevated LFT -Acute hepatitis panel negative -Resolved  Diabetes type 2 uncontrolled with complication/Diabetic Neuropathy -At home on U500 via pump; Per EMR uses 70 units which is~350 units of U100 insulin -12/27 decrease NovoLog 10 units qac -12/28 decrease Lantus 40 units daily -12/28 decrease moderate SSI  -Tradjenta 5 mg daily  [name] -Lipitor 20 mg daily -12/28 LDL= 35  OSA/OHS -On CPAP at home (on hold  here) -See Covid pneumonia   Morbid obesity (BMI 46.37 kg/m) -12/27 nutrition consult morbidly obese, uncontrolled diabetes  Insomnia -: trazodone prn qhs  Thrombocytopenia: -Resolved  Glaucoma:  -Continue home eye drops  Pressure Injury 07/09/19 Ear Right Stage 2 -  Partial thickness loss of dermis presenting as a  shallow open injury with a red, pink wound bed without slough. Red (Active)  07/09/19 0640  Location: Ear  Location Orientation: Right  Staging: Stage 2 -  Partial thickness loss of dermis presenting as a shallow open injury with a red, pink wound bed without slough.  Wound Description (Comments): Red  Present on Admission: No         DVT prophylaxis: Heparin drip Code Status: Full Family Communication: 12/28 spoke with Early (wife) explained plan of care answered all questions Disposition Plan: TBD   Consultants:    Procedures/Significant Events:  12/19 Echo;LVEF=50 to 55%.-negative regional wall abnormality..  -Moderately elevated pulmonary artery systolic pressure. 12/20 bilateral lower extremity Doppler; negative for DVT/SVT 12/24 CT chest Wo contrast;-patchy BILATERAL airspace infiltrates consistent with multifocal pneumonia and COVID-19. -Dffuse edema and enlargement of the LEFT pectoralis muscles especially pectoralis minor with surrounding infiltrative changes extending into the LEFT lateral chest wall. Favor the above changes being related to chest wall hemorrhage/hematoma though unable to exclude infection by CT. -Aortic Atherosclerosis 12/27 CT chest Wo contrast;-interval decrease in size of the left pectoralis minor muscle suggesting resolving edema/hemorrhage. No discrete hematoma at this time. -Persistent enlargement of the left pectoralis major muscle with edema/hemorrhage tracking in the subcutaneous fat of the anterior left chest wall, similar to prior. -Similar appearance of the widespread patchy airspace disease compatible with multifocal pneumonia. -Hepatic steatosis    I have personally reviewed and interpreted all radiology studies and my findings are as above.  VENTILATOR SETTINGS: HFNC 12/28 Flow; 4 L/min  SPO2; 94%    Cultures   Antimicrobials: Anti-infectives (From admission, onward)   Start     Dose/Rate Stop   06/28/19 2100  remdesivir  100 mg in sodium chloride 0.9 % 100 mL IVPB     100 mg 200 mL/hr over 30 Minutes 07/01/19 2107   07/10/2019 2100  remdesivir 200 mg in sodium chloride 0.9% 250 mL IVPB     200 mg 580 mL/hr over 30 Minutes 06/28/19 0823       Devices    LINES / TUBES:      Continuous Infusions: . sodium chloride Stopped (07/01/19 0355)  . heparin 2,000 Units/hr (07/10/19 [id number])     Objective: Vitals:   07/09/19 2015 07/09/19 2332 07/10/19 0436 07/10/19 0820  BP: (!) 141/52 (!) 130/44 (!) 149/55 139/64  Pulse: 78 77 73 73  Resp: 15 20 16 19   Temp: 98.8 F (37.1 C) 98.8 F (37.1 C) 98.2 F (36.8 C) 98 F (36.7 C)  TempSrc: Oral Oral Oral Oral  SpO2: 94% 97% 97% 94%  Weight:      Height:        Intake/Output Summary (Last 24 hours) at 07/10/2019 1022 Last data filed at 07/10/2019 1000 Gross per 24 hour  Intake 559.17 ml  Output 1050 ml  Net -490.83 ml   Filed Weights   06/28/19 0443 06/30/19 1900  Weight: (!) 182.4 kg (!) 182 kg   Physical Exam:  General: A/O x4, positive acute respiratory distress Eyes: negative scleral hemorrhage, negative anisocoria, negative icterus ENT: Negative Runny nose, negative gingival bleeding, Neck:  Negative scars, masses, torticollis, lymphadenopathy, JVD Lungs: Diffusely decreased breath  sounds without wheezes or crackles; unable to palpate hematoma in pectoralis major/minor negative pain to palpation. Cardiovascular: Regular rate and rhythm without murmur gallop or rub normal S1 and S2 Abdomen: Morbidly obese, negative abdominal pain, nondistended, positive soft, bowel sounds, no rebound, no ascites, no appreciable mass Extremities: No significant cyanosis, clubbing.  Positive bilateral lower extremity edema 1-2+ Skin: Negative rashes, lesions, ulcers Psychiatric:  Negative depression, negative anxiety, negative fatigue, negative mania  Central nervous system:  Cranial nerves II through XII intact, tongue/uvula midline, all extremities muscle  strength 5/5, sensation intact throughout, negative dysarthria, negative expressive aphasia, negative receptive aphasia.      Data Reviewed: Care during the described time interval was provided by me .  I have reviewed this patient's available data, including medical history, events of note, physical examination, and all test results as part of my evaluation.   CBC: Recent Labs  Lab 07/06/19 0230 07/07/19 1118 07/07/19 1515 07/08/19 0455 07/09/19 0247 07/10/19 0211  WBC 11.1* 10.8*  --  12.7* 11.1* 8.9  NEUTROABS 9.9* 9.7*  --  10.1* 9.6* 7.4  HGB 11.1* 9.2* 9.6* 9.0* 8.8* 8.2*  HCT 37.1* 30.2* 31.5* 28.9* 28.3* 26.5*  MCV 92.8 92.4  --  90.9 91.9 92.0  PLT 188 161  --  171 148* [id number]*   Basic Metabolic Panel: Recent Labs  Lab 07/06/19 0230 07/07/19 1118 07/08/19 0455 07/09/19 0247 07/10/19 0211  NA 141 139 141 141 138  K 4.5 4.4 4.1 4.7 4.8  CL 103 104 105 101 104  CO2 27 27 26 25 25   GLUCOSE 270* 123* 57* 48* 108*  BUN 58* 53* 46* 39* 37*  CREATININE 1.98* 1.95* 1.87* 1.72* 1.85*  CALCIUM 8.3* 7.9* 8.2* 8.3* 8.5*  MG 2.4 2.3 2.1 2.2 1.8  PHOS 5.6* 5.2* 4.2 4.6 3.9   GFR: Estimated Creatinine Clearance: 68 mL/min (A) (by C-G formula based on SCr of 1.85 mg/dL (H)). Liver Function Tests: Recent Labs  Lab 07/06/19 0230 07/07/19 1118 07/08/19 0455 07/09/19 0247 07/10/19 0211  AST 39 31 28 29  35  ALT 49* 34 34 31 31  ALKPHOS 111 80 74 66 68  BILITOT 1.0 0.7 0.8 1.1 0.8  PROT 5.7* 5.4* 5.3* 5.8* 5.4*  ALBUMIN 2.3* 2.3* 2.2* 2.3* 2.0*   No results for input(s): LIPASE, AMYLASE in the last 168 hours. No results for input(s): AMMONIA in the last 168 hours. Coagulation Profile: Recent Labs  Lab 07/06/19 2200  INR 1.2   Cardiac Enzymes: No results for input(s): CKTOTAL, CKMB, CKMBINDEX, TROPONINI in the last 168 hours. BNP (last 3 results) No results for input(s): PROBNP in the last 8760 hours. HbA1C: No results for input(s): HGBA1C in the last 72  hours. CBG: Recent Labs  Lab 07/09/19 1122 07/09/19 2202 07/10/19 0124 07/10/19 0503 07/10/19 0817  GLUCAP 167* 104* 85 102* 74   Lipid Profile: Recent Labs    07/10/19 0211  CHOL 85  HDL 28*  LDLCALC 35  TRIG 108  CHOLHDL 3.0   Thyroid Function Tests: No results for input(s): TSH, T4TOTAL, FREET4, T3FREE, THYROIDAB in the last 72 hours. Anemia Panel: Recent Labs    07/09/19 0247 07/10/19 0211  FERRITIN 331 295   Urine analysis:    Component Value Date/Time   COLORURINE YELLOW 05/17/2015 1025   APPEARANCEUR CLEAR 05/17/2015 1025   LABSPEC >=1.030 (A) 05/17/2015 1025   PHURINE 5.5 05/17/2015 1025   GLUCOSEU NEGATIVE 05/17/2015 1025   HGBUR MODERATE (A) 05/17/2015 1025   BILIRUBINUR  NEGATIVE 05/17/2015 1025   KETONESUR NEGATIVE 05/17/2015 1025   PROTEINUR NEGATIVE 12/13/2014 2120   UROBILINOGEN 0.2 05/17/2015 1025   NITRITE NEGATIVE 05/17/2015 1025   LEUKOCYTESUR NEGATIVE 05/17/2015 1025   Sepsis Labs: @LABRCNTIP (procalcitonin:4,lacticidven:4)  )No results found for this or any previous visit (from the past 240 hour(s)).       Radiology Studies: CT CHEST WO CONTRAST  Result Date: 07/09/2019 CLINICAL DATA:  Spontaneous chest wall hemorrhage. EXAM: CT CHEST WITHOUT CONTRAST TECHNIQUE: Multidetector CT imaging of the chest was performed following the standard protocol without IV contrast. COMPARISON:  07/06/2019 FINDINGS: Cardiovascular: Heart size upper normal. Trace pericardial effusion. Atherosclerotic calcification is noted in the wall of the thoracic aorta. Mediastinum/Nodes: No mediastinal lymphadenopathy. No evidence for gross hilar lymphadenopathy although assessment is limited by the lack of intravenous contrast on today's study. The esophagus has normal imaging features. There is no axillary lymphadenopathy. Lungs/Pleura: Similar appearance of the relatively diffuse patchy ground-glass opacity involving both lungs. No associated pleural effusion. Upper  Abdomen: The liver shows diffusely decreased attenuation suggesting fat deposition. Musculoskeletal: No worrisome lytic or sclerotic osseous abnormality. Interval decrease in the marked enlargement of the left pectoralis minor muscle seen previously. The left pectoralis major muscle enlargement is also slightly decreased in the interval. There is persistent edema/hemorrhage tracking in the subcutaneous fat of the left anterior chest wall. IMPRESSION: 1. Interval decrease in size of the left pectoralis minor muscle suggesting resolving edema/hemorrhage. No discrete hematoma at this time. 2. Persistent enlargement of the left pectoralis major muscle with edema/hemorrhage tracking in the subcutaneous fat of the anterior left chest wall, similar to prior. 3. No new findings to suggest interval rebleeding. 4. Similar appearance of the widespread patchy airspace disease compatible with multifocal pneumonia. 5. Hepatic steatosis 6.  Aortic Atherosclerois (ICD10-170.0) Electronically Signed   By: Misty Stanley M.D.   On: 07/09/2019 16:00        Scheduled Meds: . amLODipine  10 mg Oral Daily  . amLODipine  5 mg Oral Once  . atorvastatin  20 mg Oral q1800  . Chlorhexidine Gluconate Cloth  6 each Topical Daily  . cycloSPORINE  1 drop Both Eyes Daily  . gabapentin  300 mg Oral QHS  . insulin aspart  0-20 Units Subcutaneous TID WC  . insulin aspart  0-5 Units Subcutaneous QHS  . insulin aspart  10 Units Subcutaneous TID WC  . insulin glargine  40 Units Subcutaneous BID  . latanoprost  1 drop Both Eyes QHS  . linagliptin  5 mg Oral Daily  . mouth rinse  15 mL Mouth Rinse BID  . metoprolol tartrate  25 mg Oral Q6H  . pantoprazole  40 mg Oral Daily  . sodium chloride flush  3 mL Intravenous Q12H   Continuous Infusions: . sodium chloride Stopped (07/01/19 0355)  . heparin 2,000 Units/hr (07/10/19 [id number])     LOS: 13 days   The patient is critically ill with multiple organ systems failure and requires  high complexity decision making for assessment and support, frequent evaluation and titration of therapies, application of advanced monitoring technologies and extensive interpretation of multiple databases. Critical Care Time devoted to patient care services described in this note  Time spent: 40 minutes     Sarajane Fambrough, Geraldo Docker, MD [facility] Pager [phone]  If 7PM-7AM, please contact night-coverage [email or web address] Password [facility] 07/10/2019, 10:22 AM

## 2019-07-11 LAB — CBC WITH DIFFERENTIAL/PLATELET
Abs Immature Granulocytes: 0.08 10*3/uL — ABNORMAL HIGH (ref 0.00–0.07)
Basophils Absolute: 0 10*3/uL (ref 0.0–0.1)
Basophils Relative: 0 %
Eosinophils Absolute: 0.1 10*3/uL (ref 0.0–0.5)
Eosinophils Relative: 2 %
HCT: 26.8 % — ABNORMAL LOW (ref 39.0–52.0)
Hemoglobin: 8.1 g/dL — ABNORMAL LOW (ref 13.0–17.0)
Immature Granulocytes: 1 %
Lymphocytes Relative: 9 %
Lymphs Abs: 0.8 10*3/uL (ref 0.7–4.0)
MCH: 27.8 pg (ref 26.0–34.0)
MCHC: 30.2 g/dL (ref 30.0–36.0)
MCV: 92.1 fL (ref 80.0–100.0)
Monocytes Absolute: 0.5 10*3/uL (ref 0.1–1.0)
Monocytes Relative: 6 %
Neutro Abs: 6.8 10*3/uL (ref 1.7–7.7)
Neutrophils Relative %: 82 %
Platelets: 161 10*3/uL (ref 150–400)
RBC: 2.91 MIL/uL — ABNORMAL LOW (ref 4.22–5.81)
RDW: 15.7 % — ABNORMAL HIGH (ref 11.5–15.5)
WBC: 8.2 10*3/uL (ref 4.0–10.5)
nRBC: 0 % (ref 0.0–0.2)

## 2019-07-11 LAB — GLUCOSE, CAPILLARY
Glucose-Capillary: 102 mg/dL — ABNORMAL HIGH (ref 70–99)
Glucose-Capillary: 106 mg/dL — ABNORMAL HIGH (ref 70–99)
Glucose-Capillary: 116 mg/dL — ABNORMAL HIGH (ref 70–99)
Glucose-Capillary: 136 mg/dL — ABNORMAL HIGH (ref 70–99)
Glucose-Capillary: 142 mg/dL — ABNORMAL HIGH (ref 70–99)
Glucose-Capillary: 159 mg/dL — ABNORMAL HIGH (ref 70–99)
Glucose-Capillary: 172 mg/dL — ABNORMAL HIGH (ref 70–99)
Glucose-Capillary: 97 mg/dL (ref 70–99)

## 2019-07-11 LAB — COMPREHENSIVE METABOLIC PANEL
ALT: 32 U/L (ref 0–44)
AST: 32 U/L (ref 15–41)
Albumin: 2.1 g/dL — ABNORMAL LOW (ref 3.5–5.0)
Alkaline Phosphatase: 72 U/L (ref 38–126)
Anion gap: 12 (ref 5–15)
BUN: 30 mg/dL — ABNORMAL HIGH (ref 8–23)
CO2: 24 mmol/L (ref 22–32)
Calcium: 8.7 mg/dL — ABNORMAL LOW (ref 8.9–10.3)
Chloride: 102 mmol/L (ref 98–111)
Creatinine, Ser: 1.79 mg/dL — ABNORMAL HIGH (ref 0.61–1.24)
GFR calc Af Amer: 44 mL/min — ABNORMAL LOW (ref >60)
GFR calc non Af Amer: 38 mL/min — ABNORMAL LOW (ref >60)
Glucose, Bld: 97 mg/dL (ref 70–99)
Potassium: 4.5 mmol/L (ref 3.5–5.1)
Sodium: 138 mmol/L (ref 135–145)
Total Bilirubin: 0.9 mg/dL (ref 0.3–1.2)
Total Protein: 5.7 g/dL — ABNORMAL LOW (ref 6.5–8.1)

## 2019-07-11 LAB — HEPARIN LEVEL (UNFRACTIONATED)
Heparin Unfractionated: 0.78 IU/mL — ABNORMAL HIGH (ref 0.30–0.70)
Heparin Unfractionated: 0.62 IU/mL (ref 0.30–0.70)

## 2019-07-11 LAB — C-REACTIVE PROTEIN: CRP: 11.2 mg/dL — ABNORMAL HIGH (ref <1.0)

## 2019-07-11 LAB — FERRITIN: Ferritin: 322 ng/mL (ref 24–336)

## 2019-07-11 LAB — PHOSPHORUS: Phosphorus: 4.1 mg/dL (ref 2.5–4.6)

## 2019-07-11 LAB — MAGNESIUM: Magnesium: 1.7 mg/dL (ref 1.7–2.4)

## 2019-07-11 LAB — D-DIMER, QUANTITATIVE: D-Dimer, Quant: 3.96 ug/mL-FEU — ABNORMAL HIGH (ref 0.00–0.50)

## 2019-07-11 MED ORDER — HEPARIN (PORCINE) 25000 UT/250ML-% IV SOLN
1600.0000 [IU]/h | INTRAVENOUS | Status: DC
  Administered 2019-07-12: 1500 [IU]/h via INTRAVENOUS
  Filled 2019-07-11: qty 250

## 2019-07-11 NOTE — Progress Notes (Signed)
ANTICOAGULATION CONSULT NOTE - Follow Up Consult  Pharmacy Consult for Heparin Indication: Afib (Eliquis PTA, r/o PE with increased d-dimer  Allergies  Allergen Reactions  . Vancomycin     REACTION: ARF    Patient Measurements: Height: 6\' 6"  (198.1 cm) Weight: (!) 401 lb 3.8 oz (182 kg) IBW/kg (Calculated) : 91.4 Heparin Dosing Weight:   Vital Signs: Temp: 97.7 F (36.5 C) (12/29 1600) Temp Source: Oral (12/29 1600) BP: 148/69 (12/29 1600) Pulse Rate: 78 (12/29 1600)  Labs: Recent Labs    07/09/19 0247 07/09/19 0247 07/10/19 0211 07/10/19 0930 07/11/19 0435 07/11/19 1612  HGB 8.8*  --  8.2*  --  8.1*  --   HCT 28.3*  --  26.5*  --  26.8*  --   PLT 148*  --  136*  --  161  --   HEPARINUNFRC  --    < > 0.55 0.63 0.78* 0.62  CREATININE 1.72*  --  1.85*  --  1.79*  --    < > = values in this interval not displayed.    Estimated Creatinine Clearance: 70.3 mL/min (A) (by C-G formula based on SCr of 1.79 mg/dL (H)).   Medications:  Scheduled:  . amLODipine  10 mg Oral Daily  . amLODipine  5 mg Oral Once  . atorvastatin  20 mg Oral q1800  . Chlorhexidine Gluconate Cloth  6 each Topical Daily  . cycloSPORINE  1 drop Both Eyes Daily  . gabapentin  300 mg Oral QHS  . insulin aspart  0-15 Units Subcutaneous Q4H  . insulin aspart  10 Units Subcutaneous TID WC  . insulin glargine  40 Units Subcutaneous Daily  . latanoprost  1 drop Both Eyes QHS  . linagliptin  5 mg Oral Daily  . mouth rinse  15 mL Mouth Rinse BID  . metoprolol tartrate  25 mg Oral Q6H  . pantoprazole  40 mg Oral Daily  . sodium chloride flush  3 mL Intravenous Q12H   Infusions:  . sodium chloride Stopped (07/01/19 0355)  . heparin 1,700 Units/hr (07/11/19 1041)   PRN: sodium chloride, acetaminophen **OR** acetaminophen, albuterol, HYDROcodone-acetaminophen, ondansetron **OR** ondansetron (ZOFRAN) IV, polyvinyl alcohol, senna-docusate, traZODone  Assessment: 68 y.o. M on Eliquis 5mg  BID for PAF.  Pt reported to MD about missing several Eliquis doses PTA. Changed to full-dose Lovenox 180mg  SQ q12h for r/o PE. Dopplers negative.Anti-Xa remains > 2 despite holding Lovenox.   Anticoagulation has been on hold for several days due to chest wall hemotoma. However, due to concern with increasing d-dimer and a hx of afib, heparin has been ordered to be resumed with a lower goal. We will do it without bolus. He is morbidly obese.   PM: Heparin level remains above goal. No bleeding reported per discussion with RN.  Goal of Therapy:  Heparin level 0.3 - 0.5 units/ml Monitor platelets by anticoagulation protocol: Yes   Plan:   Decrease heparin to 1500 units/hr  Recheck heparin level in 8hrs  Daily heparin level and CBC  Peggyann Juba, PharmD, BCPS Pharmacy: [phone] 07/11/2019,5:34 PM

## 2019-07-11 NOTE — Progress Notes (Signed)
PROGRESS NOTE    [name]  [id number] DOB: March 18, 1951 DOA: 06/13/2019 PCP: Aldine Contes, MD   Brief Narrative:  68 yo BM PMHx diabetes type 2 uncontrolled with complication, diabetic peripheral neuropathy, OSA/OHS, morbid obesity, CKD STAGE III, atrial fibrillation, HTN, atrial fibrillation on Eliquis, chronic pain syndrome, anemia, atrial fibrillation on Eliquis and multiple other medical problems   Presented on 12/15 with 1 week of SOB, difficulty sleeping, poor PO intake, and presyncopal symptoms.  He was admitted from the internal medicine clinic.  He's received steroids and remdesivir.  Plan for plasma today.    Subjective: 12/29 afebrile last 24 hours A/O x4, negative CP, negative abdominal pain sitting comfortably in bed eating lunch.   Assessment & Plan:   Principal Problem:   COVID-19 with multiple comorbidities Active Problems:   Diabetes type 2, uncontrolled ([place])   Morbid obesity ([place])   Hypertension   Paroxysmal atrial fibrillation with RVR (HCC)   Respiratory failure with hypoxia (HCC)   Pneumonia due to COVID-19 virus   Acute respiratory failure with hypoxia (HCC)   Chest wall hematoma, left, subsequent encounter   Elevated d-dimer   Atrial fibrillation with RVR ([place])   Diabetes mellitus type 2, uncontrolled, with complications ([place])   Diabetic neuropathy ([place])   Obesity hypoventilation syndrome (HCC)   OSA on CPAP   Morbid obesity with BMI of 70 and over, adult ([place])   Covid pneumonia/acute respiratory failure with hypoxia COVID-19 Labs  Recent Labs    07/09/19 0247 07/10/19 0211 07/11/19 0435  DDIMER 8.28* 10.84* 3.96*  FERRITIN 331 295 322  CRP 11.2* 10.7* 11.2*    Lab Results  Component Value Date   SARSCOV2NAA POSITIVE (A) 06/17/2019   [place] NEGATIVE 01/24/2019  -Completed course Remdesivir -Complete course Steroids -12/19 transfuse 1 unit Covid convalescent plasma  LEFT Chest Wall Hematoma  -Unable to  appreciate on exam secondary to patient's habitus -12/27 repeat CT chest; continues to show hematoma.  However given patient's rising D-dimer and A. fib will start heparin drip, maintain on low side of therapeutic range. Recent Labs  Lab 07/07/19 1515 07/08/19 0455 07/09/19 0247 07/10/19 0211 07/11/19 0435  HGB 9.6* 9.0* 8.8* 8.2* 8.1*  -Appears stable continue to monitor  Elevated D-dimer -Elevated D-dimer while on Eliquis, he did miss some days while he was sick.  He was transitioned to full dose lovenox.   -LE [place] was negative for DVT and echo was as follows (normal RV systolic function, moderately elevated PASP, low normal EF - see report).  -See A. fib  A. fib with RVR -Currently NSR -Amlodipine 10 mg daily -Metoprolol 25 mg QID -12/27 Heparin drip restarted lowest therapeutic dosing per pharmacy  Essential HTN -See A. fib  Acute on CKD stage III (baseline Cr 1.5) Recent Labs  Lab 07/07/19 1118 07/08/19 0455 07/09/19 0247 07/10/19 0211 07/11/19 0435  CREATININE 1.95* 1.87* 1.72* 1.85* 1.79*  -Continues to trend down though still slightly above baseline. -Continue to hold ACE I -Strict in and out -13.7 L -Daily weight Filed Weights   06/28/19 0443 06/30/19 1900  Weight: (!) 182.4 kg (!) 182 kg    Elevated LFT -Acute hepatitis panel negative -Resolved  Diabetes type 2 uncontrolled with complication/Diabetic Neuropathy -At home on U500 via pump; Per EMR uses 70 units which is~350 units of U100 insulin -12/27 decrease NovoLog 10 units qac -12/28 decrease Lantus 40 units daily -12/28 decrease moderate SSI  -Tradjenta 5 mg daily  [name] -Lipitor 20 mg daily -12/28  LDL= 35  OSA/OHS -On CPAP at home (on hold here) -See Covid pneumonia   Morbid obesity (BMI 46.37 kg/m) -12/27 nutrition consult morbidly obese, uncontrolled diabetes  Insomnia -: trazodone prn qhs  Thrombocytopenia: -Resolved  Glaucoma:  -Continue home eye drops  Pressure injury  right ear Pressure Injury 07/09/19 Ear Right Stage 2 -  Partial thickness loss of dermis presenting as a shallow open injury with a red, pink wound bed without slough. Red (Active)  07/09/19 0640  Location: Ear  Location Orientation: Right  Staging: Stage 2 -  Partial thickness loss of dermis presenting as a shallow open injury with a red, pink wound bed without slough.  Wound Description (Comments): Red  Present on Admission: No   Goals of care -12/29 consult to CIR for placement; patient must be 20 days out from most recent positive Covid test to be accepted.     DVT prophylaxis: Heparin drip Code Status: Full Family Communication: 12/28 spoke with Early (wife) explained plan of care answered all questions Disposition Plan: TBD   Consultants:    Procedures/Significant Events:  12/19 Echo;LVEF=50 to 55%.-negative regional wall abnormality..  -Moderately elevated pulmonary artery systolic pressure. 12/20 bilateral lower extremity Doppler; negative for DVT/SVT 12/24 CT chest Wo contrast;-patchy BILATERAL airspace infiltrates consistent with multifocal pneumonia and COVID-19. -Dffuse edema and enlargement of the LEFT pectoralis muscles especially pectoralis minor with surrounding infiltrative changes extending into the LEFT lateral chest wall. Favor the above changes being related to chest wall hemorrhage/hematoma though unable to exclude infection by CT. -Aortic Atherosclerosis 12/27 CT chest Wo contrast;-interval decrease in size of the left pectoralis minor muscle suggesting resolving edema/hemorrhage. No discrete hematoma at this time. -Persistent enlargement of the left pectoralis major muscle with edema/hemorrhage tracking in the subcutaneous fat of the anterior left chest wall, similar to prior. -Similar appearance of the widespread patchy airspace disease compatible with multifocal pneumonia. -Hepatic steatosis    I have personally reviewed and interpreted all radiology  studies and my findings are as above.  VENTILATOR SETTINGS: HFNC 12/29 Flow; 5 L/min SPO2; 94%    Cultures   Antimicrobials: Anti-infectives (From admission, onward)   Start     Dose/Rate Stop   06/28/19 2100  remdesivir 100 mg in sodium chloride 0.9 % 100 mL IVPB     100 mg 200 mL/hr over 30 Minutes 07/01/19 2107   06/26/2019 2100  remdesivir 200 mg in sodium chloride 0.9% 250 mL IVPB     200 mg 580 mL/hr over 30 Minutes 06/28/19 0823       Devices    LINES / TUBES:      Continuous Infusions: . sodium chloride Stopped (07/01/19 0355)  . heparin 1,700 Units/hr (07/11/19 1041)     Objective: Vitals:   07/10/19 2146 07/11/19 0033 07/11/19 0430 07/11/19 0803  BP: (!) 141/53 117/71 136/62   Pulse: 74 74 76   Resp:  19 20   Temp:  97.9 F (36.6 C) 98.1 F (36.7 C) 98.5 F (36.9 C)  TempSrc:  Oral Oral Oral  SpO2:  95% 93%   Weight:      Height:        Intake/Output Summary (Last 24 hours) at 07/11/2019 1103 Last data filed at 07/11/2019 0935 Gross per 24 hour  Intake 492.09 ml  Output 2100 ml  Net -1607.91 ml   Filed Weights   06/28/19 0443 06/30/19 1900  Weight: (!) 182.4 kg (!) 182 kg   Physical Exam:  General: A/O x4, positive  acute respiratory distress Eyes: negative scleral hemorrhage, negative anisocoria, negative icterus ENT: Negative Runny nose, negative gingival bleeding, Neck:  Negative scars, masses, torticollis, lymphadenopathy, JVD Lungs: Decreased breath sounds diffusely, without wheezes or crackles Cardiovascular: Regular rate and rhythm without murmur gallop or rub normal S1 and S2 Abdomen: MORBIDLY OBESE negative abdominal pain, nondistended, positive soft, bowel sounds, no rebound, no ascites, no appreciable mass Extremities: No significant cyanosis, clubbing, or edema bilateral lower extremities Skin: Negative rashes, lesions, ulcers Psychiatric:  Negative depression, negative anxiety, negative fatigue, negative mania  Central  nervous system:  Cranial nerves II through XII intact, tongue/uvula midline, all extremities muscle strength 5/5, sensation intact throughout, negative dysarthria, negative expressive aphasia, negative receptive aphasia.      Data Reviewed: Care during the described time interval was provided by me .  I have reviewed this patient's available data, including medical history, events of note, physical examination, and all test results as part of my evaluation.   CBC: Recent Labs  Lab 07/07/19 1118 07/07/19 1515 07/08/19 0455 07/09/19 0247 07/10/19 0211 07/11/19 0435  WBC 10.8*  --  12.7* 11.1* 8.9 8.2  NEUTROABS 9.7*  --  10.1* 9.6* 7.4 6.8  HGB 9.2* 9.6* 9.0* 8.8* 8.2* 8.1*  HCT 30.2* 31.5* 28.9* 28.3* 26.5* 26.8*  MCV 92.4  --  90.9 91.9 92.0 92.1  PLT 161  --  171 148* 136* [id number]   Basic Metabolic Panel: Recent Labs  Lab 07/07/19 1118 07/08/19 0455 07/09/19 0247 07/10/19 0211 07/11/19 0435  NA 139 141 141 138 138  K 4.4 4.1 4.7 4.8 4.5  CL 104 105 101 104 102  CO2 27 26 25 25 24   GLUCOSE 123* 57* 48* 108* 97  BUN 53* 46* 39* 37* 30*  CREATININE 1.95* 1.87* 1.72* 1.85* 1.79*  CALCIUM 7.9* 8.2* 8.3* 8.5* 8.7*  MG 2.3 2.1 2.2 1.8 1.7  PHOS 5.2* 4.2 4.6 3.9 4.1   GFR: Estimated Creatinine Clearance: 70.3 mL/min (A) (by C-G formula based on SCr of 1.79 mg/dL (H)). Liver Function Tests: Recent Labs  Lab 07/07/19 1118 07/08/19 0455 07/09/19 0247 07/10/19 0211 07/11/19 0435  AST 31 28 29  35 32  ALT 34 34 31 31 32  ALKPHOS 80 74 66 68 72  BILITOT 0.7 0.8 1.1 0.8 0.9  PROT 5.4* 5.3* 5.8* 5.4* 5.7*  ALBUMIN 2.3* 2.2* 2.3* 2.0* 2.1*   No results for input(s): LIPASE, AMYLASE in the last 168 hours. No results for input(s): AMMONIA in the last 168 hours. Coagulation Profile: Recent Labs  Lab 07/06/19 2200  INR 1.2   Cardiac Enzymes: No results for input(s): CKTOTAL, CKMB, CKMBINDEX, TROPONINI in the last 168 hours. BNP (last 3 results) No results for input(s):  PROBNP in the last 8760 hours. HbA1C: No results for input(s): HGBA1C in the last 72 hours. CBG: Recent Labs  Lab 07/10/19 1657 07/10/19 2145 07/11/19 0040 07/11/19 0506 07/11/19 0806  GLUCAP 136* 101* 102* 97 106*   Lipid Profile: Recent Labs    07/10/19 0211  CHOL 85  HDL 28*  LDLCALC 35  TRIG 108  CHOLHDL 3.0   Thyroid Function Tests: No results for input(s): TSH, T4TOTAL, FREET4, T3FREE, THYROIDAB in the last 72 hours. Anemia Panel: Recent Labs    07/10/19 0211 07/11/19 0435  FERRITIN 295 322   Urine analysis:    Component Value Date/Time   COLORURINE YELLOW 05/17/2015 1025   APPEARANCEUR CLEAR 05/17/2015 1025   LABSPEC >=1.030 (A) 05/17/2015 1025   PHURINE 5.5 05/17/2015  [place] 05/17/2015 1025   HGBUR MODERATE (A) 05/17/2015 [place] 05/17/2015 1025   KETONESUR NEGATIVE 05/17/2015 1025   PROTEINUR NEGATIVE 12/13/2014 2120   UROBILINOGEN 0.2 05/17/2015 1025   NITRITE NEGATIVE 05/17/2015 1025   LEUKOCYTESUR NEGATIVE 05/17/2015 1025   Sepsis Labs: @LABRCNTIP (procalcitonin:4,lacticidven:4)  )No results found for this or any previous visit (from the past 240 hour(s)).       Radiology Studies: CT CHEST WO CONTRAST  Result Date: 07/09/2019 CLINICAL DATA:  Spontaneous chest wall hemorrhage. EXAM: CT CHEST WITHOUT CONTRAST TECHNIQUE: Multidetector CT imaging of the chest was performed following the standard protocol without IV contrast. COMPARISON:  07/06/2019 FINDINGS: Cardiovascular: Heart size upper normal. Trace pericardial effusion. Atherosclerotic calcification is noted in the wall of the thoracic aorta. Mediastinum/Nodes: No mediastinal lymphadenopathy. No evidence for gross hilar lymphadenopathy although assessment is limited by the lack of intravenous contrast on today's study. The esophagus has normal imaging features. There is no axillary lymphadenopathy. Lungs/Pleura: Similar appearance of the relatively diffuse  patchy ground-glass opacity involving both lungs. No associated pleural effusion. Upper Abdomen: The liver shows diffusely decreased attenuation suggesting fat deposition. Musculoskeletal: No worrisome lytic or sclerotic osseous abnormality. Interval decrease in the marked enlargement of the left pectoralis minor muscle seen previously. The left pectoralis major muscle enlargement is also slightly decreased in the interval. There is persistent edema/hemorrhage tracking in the subcutaneous fat of the left anterior chest wall. IMPRESSION: 1. Interval decrease in size of the left pectoralis minor muscle suggesting resolving edema/hemorrhage. No discrete hematoma at this time. 2. Persistent enlargement of the left pectoralis major muscle with edema/hemorrhage tracking in the subcutaneous fat of the anterior left chest wall, similar to prior. 3. No new findings to suggest interval rebleeding. 4. Similar appearance of the widespread patchy airspace disease compatible with multifocal pneumonia. 5. Hepatic steatosis 6.  Aortic Atherosclerois (ICD10-170.0) Electronically Signed   By: Misty Stanley M.D.   On: 07/09/2019 16:00        Scheduled Meds: . amLODipine  10 mg Oral Daily  . amLODipine  5 mg Oral Once  . atorvastatin  20 mg Oral q1800  . Chlorhexidine Gluconate Cloth  6 each Topical Daily  . cycloSPORINE  1 drop Both Eyes Daily  . gabapentin  300 mg Oral QHS  . insulin aspart  0-15 Units Subcutaneous Q4H  . insulin aspart  10 Units Subcutaneous TID WC  . insulin glargine  40 Units Subcutaneous Daily  . latanoprost  1 drop Both Eyes QHS  . linagliptin  5 mg Oral Daily  . mouth rinse  15 mL Mouth Rinse BID  . metoprolol tartrate  25 mg Oral Q6H  . pantoprazole  40 mg Oral Daily  . sodium chloride flush  3 mL Intravenous Q12H   Continuous Infusions: . sodium chloride Stopped (07/01/19 0355)  . heparin 1,700 Units/hr (07/11/19 1041)     LOS: 14 days   The patient is critically ill with  multiple organ systems failure and requires high complexity decision making for assessment and support, frequent evaluation and titration of therapies, application of advanced monitoring technologies and extensive interpretation of multiple databases. Critical Care Time devoted to patient care services described in this note  Time spent: 40 minutes     Jeffrie Lofstrom, Geraldo Docker, MD [facility] Pager [phone]  If 7PM-7AM, please contact night-coverage [email or web address] Password [email or web address] 07/11/2019, 11:03 AM

## 2019-07-11 NOTE — Progress Notes (Signed)
Spoke with Kennith Center, the patient's son, regarding the patient's condition.  Answered all questions at this time.

## 2019-07-11 NOTE — Progress Notes (Signed)
Occupational Therapy Treatment Patient Details Name: [name] MRN: [id number] DOB: [birth year] Today's Date: 07/11/2019    History of present illness 68 yo with T2DM c/b peripheral neuropathy, OSA, morbid obesity, CKD III, atrial fibrillation, hypertension, chronic pain, anemia, and multiple other medical problems who presented on 12/15 with 1 week of SOB, difficulty sleeping, poor PO intake, and presyncopal symptoms.  He was admitted from the internal medicine clinic to [facility] then transferred to [facility].    OT comments  Pt making progress in therapy requiring less assistance with transfer tasks. Pt continues to require mod encouragement to engage in out of bed activities. Per RN, pt has refused to get out of bed all day. Pt tolerated sitting edge of bed 15 min while attempting to use the urinal. Pt's SpO2 decreased to mid 80s on 5L [place] upon sitting edge of bed with pt requiring 3-4 min rest break to return to 90s. Pt transferred to bedside chair with RW and min assist with SpO2 decreasing to low 80s. Again pt required 3-4 min seated rest break for oxygen to increase back into 90s. 3/4 DOE. Continued to educate pt on pursed lip breathing with fair understanding and follow through. Provided pt with geomat cushion due to sacral pain when sitting in bedside chair. Pt required increased time to complete all tasks. Continued education with pt on importance of daily activity, specifically sitting upright in bedside chair during the day. Recommend SNF placement for additional rehab prior to discharge home.    Follow Up Recommendations  SNF;Supervision/Assistance - 24 hour    Equipment Recommendations  3 in 1 bedside commode    Recommendations for Other Services      Precautions / Restrictions Precautions Precautions: Fall Restrictions Weight Bearing Restrictions: No       Mobility Bed Mobility Overal bed mobility: Needs Assistance Bed Mobility: Supine to Sit     Supine to sit:  Supervision;HOB elevated     General bed mobility comments: Use of bedrail  Transfers Overall transfer level: Needs assistance Equipment used: Rolling walker (2 wheeled) Transfers: Sit to/from [facility] Sit to Stand: Min assist Stand pivot transfers: Min assist       General transfer comment: Assist x 1 with RW. Noted 0 instances of LOB, however pt unsteady on feet.     Balance Overall balance assessment: Needs assistance   Sitting balance-Leahy Scale: Good       Standing balance-Leahy Scale: Poor                             ADL either performed or assessed with clinical judgement   ADL Overall ADL's : Needs assistance/impaired                             Toileting- Clothing Manipulation and Hygiene: Supervision/safety;Set up [place] Manipulation Details (indicate cue type and reason): Pt able to use the urinal while seated EOB.              Vision       Perception     Praxis      Cognition Arousal/Alertness: Awake/alert Behavior During Therapy: WFL for tasks assessed/performed Overall Cognitive Status: No family/caregiver present to determine baseline cognitive functioning  Exercises     Shoulder Instructions       General Comments Pt on 5L [place] with SpO2 94% at rest. SpO2 decreases to low 80s with activity, with pt requiring 3-4 min seated rest break to return to 90s. Continued education with pt on pursed lip breathing with fair understanding and follow through.     Pertinent Vitals/ Pain       Pain Assessment: 0-10 Pain Score: 7  Pain Location: sacrum Pain Descriptors / Indicators: Discomfort;Guarding;Aching Pain Intervention(s): Monitored during session;Repositioned;Other (comment)(RN aware)  Home Living                                          Prior Functioning/Environment              Frequency            Progress Toward Goals  OT Goals(current goals can now be found in the care plan section)  Progress towards OT goals: Progressing toward goals  ADL Goals Pt Will Perform Upper Body Bathing: with set-up;sitting Pt Will Perform Lower Body Bathing: with mod assist;sit to/from stand;with adaptive equipment Pt Will Transfer to Toilet: with +2 assist;with min guard assist;bedside commode;stand pivot transfer  Plan Discharge plan remains appropriate    Co-evaluation                 AM-PAC OT "6 Clicks" Daily Activity     Outcome Measure   Help from another person eating meals?: None Help from another person taking care of personal grooming?: A Little Help from another person toileting, which includes using toliet, bedpan, or urinal?: A Lot Help from another person bathing (including washing, rinsing, drying)?: A Lot Help from another person to put on and taking off regular upper body clothing?: A Little Help from another person to put on and taking off regular lower body clothing?: A Lot 6 Click Score: 16    End of Session Equipment Utilized During Treatment: Rolling walker;Oxygen  OT Visit Diagnosis: Unsteadiness on feet (R26.81);Other abnormalities of gait and mobility (R26.89);History of falling (Z91.81);Muscle weakness (generalized) (M62.81);Other symptoms and signs involving cognitive function;Pain   Activity Tolerance Patient limited by fatigue;Patient limited by pain(Limited by SOB)   Patient Left in chair;with call bell/phone within reach;with chair alarm set   Nurse Communication Mobility status        Time: [id number] OT Time Calculation (min): 40 min  Charges: OT General Charges $OT Visit: 1 Visit OT Treatments $Therapeutic Activity: 38-52 mins  Mauri Brooklyn OTR/L [phone]    Mauri Brooklyn 07/11/2019, 5:04 PM

## 2019-07-11 NOTE — Progress Notes (Signed)
ANTICOAGULATION Consult Note  Pharmacy Consulte: Lovenox>IV UFH Indication: Afib (Eliquis PTA), r/o PE with increased d-dimer  Allergies  Allergen Reactions  . Vancomycin     REACTION: ARF    Patient Measurements: Height: 6\' 6"  (198.1 cm) Weight: (!) 401 lb 3.8 oz (182 kg) IBW/kg (Calculated) : 91.4  Vital Signs: Temp: 98.1 F (36.7 C) (12/29 0430) Temp Source: Oral (12/29 0430) BP: 136/62 (12/29 0430) Pulse Rate: 76 (12/29 0430)  Labs: Recent Labs    07/09/19 0247 07/10/19 0211 07/10/19 0930 07/11/19 0435  HGB 8.8* 8.2*  --  8.1*  HCT 28.3* 26.5*  --  26.8*  PLT 148* 136*  --  161  HEPARINUNFRC  --  0.55 0.63 0.78*  CREATININE 1.72* 1.85*  --  1.79*    Estimated Creatinine Clearance: 70.3 mL/min (A) (by C-G formula based on SCr of 1.79 mg/dL (H)).   Assessment: 68 y.o. M on Eliquis 5mg  BID for PAF. Pt reported to MD about missing several Eliquis doses PTA. Changed to full-dose Lovenox 180mg  SQ q12h for r/o PE. Dopplers negative.Anti-Xa remains > 2 despite holding Lovenox.   Anticoagulation has been on hold for several days due to chest wall hemotoma. However, due to concern with increasing d-dimer and a hx of afib, heparin has been ordered to be resumed with a lower goal. We will do it without bolus. He is morbidly obese.   HL remains above reduced goal at 0.78. No bleeding per RN.   Goal of Therapy:  Heparin level 0.3-0.5 units/ml Monitor platelets by anticoagulation protocol: Yes   Plan:  Decrease heparin infusion to 1700 units/hr F/u with 8 hr heparin level to confirm Daily heparin level Thanks for allowing pharmacy to be a part of this patient's care.  Ulice Dash, PharmD, BCPS

## 2019-07-11 NOTE — Progress Notes (Addendum)
Inpatient Rehabilitation Admissions Coordinator  Inpatient rehab consult received and I spoke with Dr. Sherral Hammers concerning our post COVID protocols for accepting to CIR. Patients are eligible when >20 days since positive and have recovered or improved from [place]. They do not require retesting for possible admission. If less than 20 days from onset still require 2 negative tests prior to possible admission to [facility]. I can not pursue insurance approval until close to medically ready to admit. His 20 days are up 07/16/2018. I will continue to follow his progress to further assess.  Danne Baxter, RN, MSN Rehab Admissions Coordinator [phone] 07/11/2019 3:30 PM

## 2019-07-12 LAB — CBC WITH DIFFERENTIAL/PLATELET
Abs Immature Granulocytes: 0.11 10*3/uL — ABNORMAL HIGH (ref 0.00–0.07)
Basophils Absolute: 0 10*3/uL (ref 0.0–0.1)
Basophils Relative: 0 %
Eosinophils Absolute: 0.1 10*3/uL (ref 0.0–0.5)
Eosinophils Relative: 2 %
HCT: 26 % — ABNORMAL LOW (ref 39.0–52.0)
Hemoglobin: 7.9 g/dL — ABNORMAL LOW (ref 13.0–17.0)
Immature Granulocytes: 1 %
Lymphocytes Relative: 11 %
Lymphs Abs: 0.8 10*3/uL (ref 0.7–4.0)
MCH: 27.9 pg (ref 26.0–34.0)
MCHC: 30.4 g/dL (ref 30.0–36.0)
MCV: 91.9 fL (ref 80.0–100.0)
Monocytes Absolute: 0.6 10*3/uL (ref 0.1–1.0)
Monocytes Relative: 7 %
Neutro Abs: 6 10*3/uL (ref 1.7–7.7)
Neutrophils Relative %: 79 %
Platelets: 172 10*3/uL (ref 150–400)
RBC: 2.83 MIL/uL — ABNORMAL LOW (ref 4.22–5.81)
RDW: 15.7 % — ABNORMAL HIGH (ref 11.5–15.5)
WBC: 7.7 10*3/uL (ref 4.0–10.5)
nRBC: 0 % (ref 0.0–0.2)

## 2019-07-12 LAB — GLUCOSE, CAPILLARY
Glucose-Capillary: 116 mg/dL — ABNORMAL HIGH (ref 70–99)
Glucose-Capillary: 146 mg/dL — ABNORMAL HIGH (ref 70–99)
Glucose-Capillary: 161 mg/dL — ABNORMAL HIGH (ref 70–99)
Glucose-Capillary: 165 mg/dL — ABNORMAL HIGH (ref 70–99)
Glucose-Capillary: 93 mg/dL (ref 70–99)
Glucose-Capillary: 97 mg/dL (ref 70–99)

## 2019-07-12 LAB — C-REACTIVE PROTEIN: CRP: 10.8 mg/dL — ABNORMAL HIGH (ref <1.0)

## 2019-07-12 LAB — COMPREHENSIVE METABOLIC PANEL
ALT: 35 U/L (ref 0–44)
AST: 29 U/L (ref 15–41)
Albumin: 2.1 g/dL — ABNORMAL LOW (ref 3.5–5.0)
Alkaline Phosphatase: 77 U/L (ref 38–126)
Anion gap: 8 (ref 5–15)
BUN: 29 mg/dL — ABNORMAL HIGH (ref 8–23)
CO2: 24 mmol/L (ref 22–32)
Calcium: 8.6 mg/dL — ABNORMAL LOW (ref 8.9–10.3)
Chloride: 105 mmol/L (ref 98–111)
Creatinine, Ser: 1.86 mg/dL — ABNORMAL HIGH (ref 0.61–1.24)
GFR calc Af Amer: 42 mL/min — ABNORMAL LOW (ref >60)
GFR calc non Af Amer: 36 mL/min — ABNORMAL LOW (ref >60)
Glucose, Bld: 111 mg/dL — ABNORMAL HIGH (ref 70–99)
Potassium: 4.7 mmol/L (ref 3.5–5.1)
Sodium: 137 mmol/L (ref 135–145)
Total Bilirubin: 0.7 mg/dL (ref 0.3–1.2)
Total Protein: 5.7 g/dL — ABNORMAL LOW (ref 6.5–8.1)

## 2019-07-12 LAB — D-DIMER, QUANTITATIVE: D-Dimer, Quant: 3.71 ug/mL-FEU — ABNORMAL HIGH (ref 0.00–0.50)

## 2019-07-12 LAB — HEPARIN LEVEL (UNFRACTIONATED)
Heparin Unfractionated: 0.27 IU/mL — ABNORMAL LOW (ref 0.30–0.70)
Heparin Unfractionated: 0.37 IU/mL (ref 0.30–0.70)
Heparin Unfractionated: 0.24 IU/mL — ABNORMAL LOW (ref 0.30–0.70)

## 2019-07-12 LAB — MAGNESIUM: Magnesium: 1.7 mg/dL (ref 1.7–2.4)

## 2019-07-12 LAB — PHOSPHORUS: Phosphorus: 4.3 mg/dL (ref 2.5–4.6)

## 2019-07-12 LAB — FERRITIN: Ferritin: 347 ng/mL — ABNORMAL HIGH (ref 24–336)

## 2019-07-12 MED ORDER — SODIUM CHLORIDE 0.9 % IV SOLN: INTRAVENOUS | Status: AC

## 2019-07-12 MED ORDER — HEPARIN (PORCINE) 25000 UT/250ML-% IV SOLN
1850.0000 [IU]/h | INTRAVENOUS | Status: DC
  Administered 2019-07-12: 1750 [IU]/h via INTRAVENOUS
  Administered 2019-07-13 – 2019-07-15 (×4): 1850 [IU]/h via INTRAVENOUS
  Filled 2019-07-12 (×6): qty 250

## 2019-07-12 MED ORDER — METOPROLOL TARTRATE 25 MG PO TABS
37.5000 mg | ORAL_TABLET | Freq: Four times a day (QID) | ORAL | Status: DC
  Administered 2019-07-12 – 2019-07-19 (×27): 37.5 mg via ORAL
  Filled 2019-07-12 (×28): qty 2

## 2019-07-12 NOTE — Progress Notes (Signed)
PROGRESS NOTE    [name]  [id number] DOB: [birth year] DOA: 06/23/2019 PCP: Aldine Contes, MD   Brief Narrative:  68 yo BM PMHx diabetes type 2 uncontrolled with complication, diabetic peripheral neuropathy, OSA/OHS, morbid obesity, CKD STAGE III, atrial fibrillation, HTN, atrial fibrillation on Eliquis, chronic pain syndrome, anemia, atrial fibrillation on Eliquis and multiple other medical problems   Presented on 12/15 with 1 week of SOB, difficulty sleeping, poor PO intake, and presyncopal symptoms.  He was admitted from the internal medicine clinic.  He's received steroids and remdesivir.  Plan for plasma today.    Subjective: 12/30 A/O x4, negative CP, positive S OB     Assessment & Plan:   Principal Problem:   COVID-19 with multiple comorbidities Active Problems:   Diabetes type 2, uncontrolled ([place])   Morbid obesity ([place])   Hypertension   Paroxysmal atrial fibrillation with RVR (HCC)   Respiratory failure with hypoxia (HCC)   Pneumonia due to COVID-19 virus   Acute respiratory failure with hypoxia (HCC)   Chest wall hematoma, left, subsequent encounter   Elevated d-dimer   Atrial fibrillation with RVR ([place])   Diabetes mellitus type 2, uncontrolled, with complications ([place])   Diabetic neuropathy ([place])   Obesity hypoventilation syndrome (HCC)   OSA on CPAP   Morbid obesity with BMI of 70 and over, adult ([place])   Covid pneumonia/acute respiratory failure with hypoxia COVID-19 Labs  Recent Labs    07/10/19 0211 07/11/19 0435 07/12/19 0143  DDIMER 10.84* 3.96* 3.71*  FERRITIN 295 322 347*  CRP 10.7* 11.2* 10.8*    Lab Results  Component Value Date   SARSCOV2NAA POSITIVE (A) 06/26/2019   [place] NEGATIVE 01/24/2019  -Completed course Remdesivir -Complete course Steroids -12/19 transfuse 1 unit Covid convalescent plasma  LEFT Chest Wall Hematoma  -Unable to appreciate on exam secondary to patient's habitus -12/27 repeat CT chest;  continues to show hematoma.  However given patient's rising D-dimer and A. fib will start heparin drip, maintain on low side of therapeutic range. Recent Labs  Lab 07/08/19 0455 07/09/19 0247 07/10/19 0211 07/11/19 0435 07/12/19 0143  HGB 9.0* 8.8* 8.2* 8.1* 7.9*  -Stable to slowly trending down.  We will continue to monitor.  Elevated D-dimer -Elevated D-dimer while on Eliquis, he did miss some days while he was sick.  He was transitioned to full dose lovenox.   -LE [place] was negative for DVT and echo was as follows (normal RV systolic function, moderately elevated PASP, low normal EF - see report).  -See A. fib  A. fib with RVR -Currently NSR -Amlodipine 10 mg daily -12/30 increase Metoprolol 37.5 mg QID -12/27 Heparin drip restarted lowest therapeutic dosing per pharmacy  Essential HTN -See A. fib  Acute on CKD stage III (baseline Cr 1.5) Recent Labs  Lab 07/08/19 0455 07/09/19 0247 07/10/19 0211 07/11/19 0435 07/12/19 0143  CREATININE 1.87* 1.72* 1.85* 1.79* 1.86*  -Continues to trend down though still slightly above baseline. -Continue to hold ACE I -Strict in and out -17.2 L -Daily weight Filed Weights   06/28/19 0443 06/30/19 1900  Weight: (!) 182.4 kg (!) 182 kg  -12/30 believe patient may be a little too dry normal saline 180ml/hr  Elevated LFT -Acute hepatitis panel negative -Resolved  Diabetes type 2 uncontrolled with complication/Diabetic Neuropathy -At home on U500 via pump; Per EMR uses 70 units which is~350 units of U100 insulin -12/27 decrease NovoLog 10 units qac -12/28 decrease Lantus 40 units daily -12/28 decrease moderate SSI  -  Tradjenta 5 mg daily  [name] -Lipitor 20 mg daily -12/28 LDL= 35  OSA/OHS -On CPAP at home (on hold here) -See Covid pneumonia   Morbid obesity (BMI 46.37 kg/m) -12/27 nutrition consult morbidly obese, uncontrolled diabetes  Insomnia -: trazodone prn qhs  Thrombocytopenia: -Resolved  Glaucoma:  -Continue  home eye drops  Pressure injury right ear Pressure Injury 07/09/19 Ear Right Stage 2 -  Partial thickness loss of dermis presenting as a shallow open injury with a red, pink wound bed without slough. Red (Active)  07/09/19 0640  Location: Ear  Location Orientation: Right  Staging: Stage 2 -  Partial thickness loss of dermis presenting as a shallow open injury with a red, pink wound bed without slough.  Wound Description (Comments): Red  Present on Admission: No   Goals of care -12/29 consult to CIR for placement; patient must be 20 days out from most recent positive Covid test to be accepted.     DVT prophylaxis: Heparin drip Code Status: Full Family Communication: 12/28 spoke with Early (wife) explained plan of care answered all questions Disposition Plan: TBD   Consultants:    Procedures/Significant Events:  12/19 Echo;LVEF=50 to 55%.-negative regional wall abnormality..  -Moderately elevated pulmonary artery systolic pressure. 12/20 bilateral lower extremity Doppler; negative for DVT/SVT 12/24 CT chest Wo contrast;-patchy BILATERAL airspace infiltrates consistent with multifocal pneumonia and COVID-19. -Dffuse edema and enlargement of the LEFT pectoralis muscles especially pectoralis minor with surrounding infiltrative changes extending into the LEFT lateral chest wall. Favor the above changes being related to chest wall hemorrhage/hematoma though unable to exclude infection by CT. -Aortic Atherosclerosis 12/27 CT chest Wo contrast;-interval decrease in size of the left pectoralis minor muscle suggesting resolving edema/hemorrhage. No discrete hematoma at this time. -Persistent enlargement of the left pectoralis major muscle with edema/hemorrhage tracking in the subcutaneous fat of the anterior left chest wall, similar to prior. -Similar appearance of the widespread patchy airspace disease compatible with multifocal pneumonia. -Hepatic steatosis    I have personally  reviewed and interpreted all radiology studies and my findings are as above.  VENTILATOR SETTINGS: HFNC 12/30 Flow; 8 L/min SPO2; 98%    Cultures   Antimicrobials: Anti-infectives (From admission, onward)   Start     Dose/Rate Stop   06/28/19 2100  remdesivir 100 mg in sodium chloride 0.9 % 100 mL IVPB     100 mg 200 mL/hr over 30 Minutes 07/01/19 2107   06/26/2019 2100  remdesivir 200 mg in sodium chloride 0.9% 250 mL IVPB     200 mg 580 mL/hr over 30 Minutes 06/28/19 0823       Devices    LINES / TUBES:      Continuous Infusions: . sodium chloride Stopped (07/01/19 0355)  . heparin 1,600 Units/hr (07/12/19 0448)     Objective: Vitals:   07/11/19 2129 07/12/19 0004 07/12/19 0449 07/12/19 0704  BP: (!) 138/57 128/74 135/83 (!) 158/65  Pulse: 78 70 84 81  Resp:  18 20 13   Temp:  98.1 F (36.7 C) 98 F (36.7 C) 97.9 F (36.6 C)  TempSrc:  Oral Oral Oral  SpO2:  97%  94%  Weight:      Height:        Intake/Output Summary (Last 24 hours) at 07/12/2019 1257 Last data filed at 07/12/2019 0706 Gross per 24 hour  Intake 448.48 ml  Output 2550 ml  Net -2101.52 ml   Filed Weights   06/28/19 0443 06/30/19 1900  Weight: (!) 182.4 kg Marland Kitchen)  182 kg   Physical Exam:  General: A/O x4, positive acute respiratory distress Eyes: negative scleral hemorrhage, negative anisocoria, negative icterus ENT: Negative Runny nose, negative gingival bleeding, Neck:  Negative scars, masses, torticollis, lymphadenopathy, JVD Lungs: Decreased breath sounds bilaterally without wheezes or crackles Cardiovascular: Regular rate and rhythm without murmur gallop or rub normal S1 and S2 Abdomen: MORBIDLY OBESE negative abdominal pain, nondistended, positive soft, bowel sounds, no rebound, no ascites, no appreciable mass Extremities: No significant cyanosis, clubbing, or edema bilateral lower extremities Skin: Negative rashes, lesions, ulcers Psychiatric:  Negative depression, negative  anxiety, negative fatigue, negative mania  Central nervous system:  Cranial nerves II through XII intact, tongue/uvula midline, all extremities muscle strength 5/5, sensation intact throughout, negative dysarthria, negative expressive aphasia, negative receptive aphasia.      Data Reviewed: Care during the described time interval was provided by me .  I have reviewed this patient's available data, including medical history, events of note, physical examination, and all test results as part of my evaluation.   CBC: Recent Labs  Lab 07/08/19 0455 07/09/19 0247 07/10/19 0211 07/11/19 0435 07/12/19 0143  WBC 12.7* 11.1* 8.9 8.2 7.7  NEUTROABS 10.1* 9.6* 7.4 6.8 6.0  HGB 9.0* 8.8* 8.2* 8.1* 7.9*  HCT 28.9* 28.3* 26.5* 26.8* 26.0*  MCV 90.9 91.9 92.0 92.1 91.9  PLT 171 148* 136* 161 [id number]   Basic Metabolic Panel: Recent Labs  Lab 07/08/19 0455 07/09/19 0247 07/10/19 0211 07/11/19 0435 07/12/19 0143  NA 141 141 138 138 137  K 4.1 4.7 4.8 4.5 4.7  CL 105 101 104 102 105  CO2 26 25 25 24 24   GLUCOSE 57* 48* 108* 97 111*  BUN 46* 39* 37* 30* 29*  CREATININE 1.87* 1.72* 1.85* 1.79* 1.86*  CALCIUM 8.2* 8.3* 8.5* 8.7* 8.6*  MG 2.1 2.2 1.8 1.7 1.7  PHOS 4.2 4.6 3.9 4.1 4.3   GFR: Estimated Creatinine Clearance: 67.6 mL/min (A) (by C-G formula based on SCr of 1.86 mg/dL (H)). Liver Function Tests: Recent Labs  Lab 07/08/19 0455 07/09/19 0247 07/10/19 0211 07/11/19 0435 07/12/19 0143  AST 28 29 35 32 29  ALT 34 31 31 32 35  ALKPHOS 74 66 68 72 77  BILITOT 0.8 1.1 0.8 0.9 0.7  PROT 5.3* 5.8* 5.4* 5.7* 5.7*  ALBUMIN 2.2* 2.3* 2.0* 2.1* 2.1*   No results for input(s): LIPASE, AMYLASE in the last 168 hours. No results for input(s): AMMONIA in the last 168 hours. Coagulation Profile: Recent Labs  Lab 07/06/19 2200  INR 1.2   Cardiac Enzymes: No results for input(s): CKTOTAL, CKMB, CKMBINDEX, TROPONINI in the last 168 hours. BNP (last 3 results) No results for input(s):  PROBNP in the last 8760 hours. HbA1C: No results for input(s): HGBA1C in the last 72 hours. CBG: Recent Labs  Lab 07/11/19 2125 07/12/19 0004 07/12/19 0500 07/12/19 0710 07/12/19 1135  GLUCAP 136* 116* 93 97 161*   Lipid Profile: Recent Labs    07/10/19 0211  CHOL 85  HDL 28*  LDLCALC 35  TRIG 108  CHOLHDL 3.0   Thyroid Function Tests: No results for input(s): TSH, T4TOTAL, FREET4, T3FREE, THYROIDAB in the last 72 hours. Anemia Panel: Recent Labs    07/11/19 0435 07/12/19 0143  FERRITIN 322 347*   Urine analysis:    Component Value Date/Time   COLORURINE YELLOW 05/17/2015 1025   APPEARANCEUR CLEAR 05/17/2015 1025   LABSPEC >=1.030 (A) 05/17/2015 1025   PHURINE 5.5 05/17/2015 1025   GLUCOSEU NEGATIVE  05/17/2015 1025   HGBUR MODERATE (A) 05/17/2015 1025   BILIRUBINUR NEGATIVE 05/17/2015 1025   KETONESUR NEGATIVE 05/17/2015 1025   PROTEINUR NEGATIVE 12/13/2014 2120   UROBILINOGEN 0.2 05/17/2015 1025   NITRITE NEGATIVE 05/17/2015 1025   LEUKOCYTESUR NEGATIVE 05/17/2015 1025   Sepsis Labs: @LABRCNTIP (procalcitonin:4,lacticidven:4)  )No results found for this or any previous visit (from the past 240 hour(s)).       Radiology Studies: No results found.      Scheduled Meds: . amLODipine  10 mg Oral Daily  . atorvastatin  20 mg Oral q1800  . Chlorhexidine Gluconate Cloth  6 each Topical Daily  . cycloSPORINE  1 drop Both Eyes Daily  . gabapentin  300 mg Oral QHS  . insulin aspart  0-15 Units Subcutaneous Q4H  . insulin aspart  10 Units Subcutaneous TID WC  . insulin glargine  40 Units Subcutaneous Daily  . latanoprost  1 drop Both Eyes QHS  . linagliptin  5 mg Oral Daily  . mouth rinse  15 mL Mouth Rinse BID  . metoprolol tartrate  25 mg Oral Q6H  . pantoprazole  40 mg Oral Daily  . sodium chloride flush  3 mL Intravenous Q12H   Continuous Infusions: . sodium chloride Stopped (07/01/19 0355)  . heparin 1,600 Units/hr (07/12/19 0448)     LOS:  15 days   The patient is critically ill with multiple organ systems failure and requires high complexity decision making for assessment and support, frequent evaluation and titration of therapies, application of advanced monitoring technologies and extensive interpretation of multiple databases. Critical Care Time devoted to patient care services described in this note  Time spent: 40 minutes     Chistine Dematteo, Geraldo Docker, MD [facility] Pager [phone]  If 7PM-7AM, please contact night-coverage [email or web address] Password [email or web address] 07/12/2019, 12:57 PM

## 2019-07-12 NOTE — Progress Notes (Signed)
PT Cancellation Note  Patient Details Name: [name] MRN: [id number] DOB: 02-10-1951   Cancelled Treatment:    Reason Eval/Treat Not Completed: Patient declined, states he going to rest for an hour, then RN will be comiong to get him up. Will check back another time.  Claretha Cooper 07/12/2019, 2:20 PM  Tresa Endo [place] Pager [phone] Office [phone]

## 2019-07-12 NOTE — Progress Notes (Signed)
ANTICOAGULATION CONSULT NOTE - Follow Up Consult  Pharmacy Consult for Heparin Indication: Afib (Eliquis PTA, r/o PE with increased d-dimer)  Allergies  Allergen Reactions  . Vancomycin     REACTION: ARF    Patient Measurements: Height: 6\' 6"  (198.1 cm) Weight: (!) 401 lb 3.8 oz (182 kg) IBW/kg (Calculated) : 91.4 Heparin Dosing Weight:   Vital Signs: Temp: 97.9 F (36.6 C) (12/30 0704) Temp Source: Oral (12/30 0704) BP: 158/65 (12/30 0704) Pulse Rate: 81 (12/30 0704)  Labs: Recent Labs    07/10/19 0211 07/11/19 0435 07/11/19 1612 07/12/19 0143 07/12/19 0957  HGB 8.2* 8.1*  --  7.9*  --   HCT 26.5* 26.8*  --  26.0*  --   PLT 136* 161  --  172  --   HEPARINUNFRC 0.55 0.78* 0.62 0.27* 0.37  CREATININE 1.85* 1.79*  --  1.86*  --     Estimated Creatinine Clearance: 67.6 mL/min (A) (by C-G formula based on SCr of 1.86 mg/dL (H)).   Assessment: 68 y.o. M on Eliquis 5mg  BID for PAF. Pt reported to MD about missing several Eliquis doses PTA. Changed to full-dose Lovenox 180mg  SQ q12h for r/o PE. Dopplers negative. Anti-Xa remains > 2 despite holding Lovenox.   Anticoagulation has been on hold for several days due to chest wall hemotoma. However, due to concern with increasing d-dimer and a hx of afib, heparin has been ordered to be resumed with a lower goal. We will do it without bolus. He is morbidly obese.   Heparin level is therapeutic at 0.37 on 1600 units/hr. No bleeding noted but has chest wall hematoma being watched, Hgb down to 7.9, platelets are normal.   Goal of Therapy:  Heparin level 0.3 - 0.5 units/ml Monitor platelets by anticoagulation protocol: Yes   Plan:   Continue heparin infusion rate at 1600 units/hr   Confirmatory heparin level this afternoon  Daily heparin level and CBC  Monitor for s/sx of bleeding  Thank you for involving pharmacy in this patient's care.  Renold Genta, PharmD, BCPS Clinical Pharmacist Clinical phone for  07/12/2019 until 3:30p is [phone] 07/12/2019 11:44 AM  **Pharmacist phone directory can be found on [email or web address] listed under [place]**

## 2019-07-12 NOTE — Progress Notes (Addendum)
ANTICOAGULATION CONSULT NOTE - Follow Up Consult  Pharmacy Consult for Heparin Indication: Afib (Eliquis PTA, r/o PE with increased d-dimer  Allergies  Allergen Reactions  . Vancomycin     REACTION: ARF    Patient Measurements: Height: 6\' 6"  (198.1 cm) Weight: (!) 401 lb 3.8 oz (182 kg) IBW/kg (Calculated) : 91.4 Heparin Dosing Weight:   Vital Signs: Temp: 98.1 F (36.7 C) (12/30 0004) Temp Source: Oral (12/30 0004) BP: 128/74 (12/30 0004) Pulse Rate: 70 (12/30 0004)  Labs: Recent Labs    07/10/19 0211 07/11/19 0435 07/11/19 1612 07/12/19 0143  HGB 8.2* 8.1*  --  7.9*  HCT 26.5* 26.8*  --  26.0*  PLT 136* 161  --  172  HEPARINUNFRC 0.55 0.78* 0.62 0.27*  CREATININE 1.85* 1.79*  --  1.86*    Estimated Creatinine Clearance: 67.6 mL/min (A) (by C-G formula based on SCr of 1.86 mg/dL (H)).   Assessment: 68 y.o. M on Eliquis 5mg  BID for PAF. Pt reported to MD about missing several Eliquis doses PTA. Changed to full-dose Lovenox 180mg  SQ q12h for r/o PE. Dopplers negative.Anti-Xa remains > 2 despite holding Lovenox.   Anticoagulation has been on hold for several days due to chest wall hemotoma. However, due to concern with increasing d-dimer and a hx of afib, heparin has been ordered to be resumed with a lower goal. We will do it without bolus. He is morbidly obese.   07/12/19 0330 UPDATE Heparin level: 0.27 IU/mL, just slightly below therapeutic goal range Hb has dropped to 7.9, but RN reports no signs of bleeding or issues with infusion D-Dimer continues to trend down   Goal of Therapy:  Heparin level 0.3 - 0.5 units/ml Monitor platelets by anticoagulation protocol: Yes   Plan:   Increase heparin infusion rate by  to 1600 units/hr   Re-check heparin level in 8hrs  Daily heparin level and CBC  Despina Pole, Pharm. D. Clinical Pharmacist 07/12/2019 3:21 AM

## 2019-07-12 NOTE — Progress Notes (Signed)
ANTICOAGULATION CONSULT NOTE - Follow Up Consult  Pharmacy Consult for Heparin Indication: Afib (Eliquis PTA, r/o PE with increased d-dimer)  Allergies  Allergen Reactions  . Vancomycin     REACTION: ARF    Patient Measurements: Height: 6\' 6"  (198.1 cm) Weight: (!) 401 lb 3.8 oz (182 kg) IBW/kg (Calculated) : 91.4 Heparin Dosing Weight:   Vital Signs: Temp: 97.7 F (36.5 C) (12/30 1536) Temp Source: Oral (12/30 1536) BP: 182/77 (12/30 1536) Pulse Rate: 81 (12/30 1536)  Labs: Recent Labs    07/10/19 0211 07/11/19 0435 07/12/19 0143 07/12/19 0957 07/12/19 1625  HGB 8.2* 8.1* 7.9*  --   --   HCT 26.5* 26.8* 26.0*  --   --   PLT 136* 161 172  --   --   HEPARINUNFRC 0.55 0.78* 0.27* 0.37 0.24*  CREATININE 1.85* 1.79* 1.86*  --   --     Estimated Creatinine Clearance: 67.6 mL/min (A) (by C-G formula based on SCr of 1.86 mg/dL (H)).   Assessment: 68 y.o. M on Eliquis 5mg  BID for PAF. Pt reported to MD about missing several Eliquis doses PTA. Changed to full-dose Lovenox 180mg  SQ q12h for r/o PE. Dopplers negative. Anti-Xa remains > 2 despite holding Lovenox.   Anticoagulation was on hold for several days due to chest wall hemotoma. However, due to concern with increasing d-dimer and a hx of afib, heparin was ordered to be resumed with a lower goal. We will do it without bolus. He is morbidly obese.   Heparin level is SUBtherapeutic this afternoon at 0.24 after continuing on 1600 units/hr. With a heparin level in this range, would normally increase heparin infusion by 2 units/kg/hr but this would be ~350 units/hr increase. Given the conservative goal, will only increase by 150 units/hr at this time.  No bleeding noted but has chest wall hematoma being watched, Hgb down to 7.9, platelets are normal.  Goal of Therapy:  Heparin level 0.3 - 0.5 units/ml Monitor platelets by anticoagulation protocol: Yes   Plan:   Increase heparin infusion rate to 1750 units/hr   Daily  heparin level and CBC  Monitor for s/sx of bleeding  Thank you for involving pharmacy in this patient's care.  Kennon Holter, PharmD PGY1 Ambulatory Care Pharmacy Resident [employer] Phone: [phone] 07/12/2019 5:32 PM

## 2019-07-12 NOTE — Consult Note (Addendum)
Physical Medicine and Rehabilitation Consult Reason for Consult: Impaired mobility and ADLs Referring Physician: Dr. Dia Crawford   HPI: [name] is a 68 y.o. male with PMH of DM2 with peripheral neuropathy, OSA, morbid obesity, CKD stage 3, afib, HTN, chronic pain syndrome, and anemia, who presented on 12/15 with 1 week of SOB, difficulty sleeping, poor po intake, and presyncopal symptoms. He received steroids, remdesivir, and plasma.   Currently on Heparin drip for possible PE given rising D-Dimer and history of Afib.    ROS Negative except as indicated in HPI. Denies CP, abdominal pain Past Medical History:  Diagnosis Date  . Arthritis    "elbows & knees" (12/13/2014)  . Asthma   . CKD (chronic kidney disease), stage III   . DIABETIC FOOT ULCER 06/20/2009  . Edema, macular, due to secondary diabetes ([place])   . Erectile dysfunction   . GERD (gastroesophageal reflux disease)   . HEARING LOSS, SENSORINEURAL, BILATERAL 01/03/2007   Seen by ENT Dr. Orpah Greek D. Redmond Baseman 01/03/07  . Hemorrhoids   . History of echocardiogram    a. 04/2008 Echo: EF 50-55%, abnl LV relaxation, mildly dil LA.  Marland Kitchen Hyperlipidemia   . Hypertension   . Morbid obesity ([place])   . Neuropathy, lower extremity   . OSA (obstructive sleep apnea)    uses CPAP nightly  . OSA on CPAP    Nocturnal polysomnogram on 01/21/2010 showed severe obstructive sleep apnea/hypopnea syndrome, AHI 74.1 per hour with non positional events, moderately loud snoring, and oxygen desaturation to a nadir of 78% on room air.  CPAP was successfully titrated to 17 CWP, AHI 1.1 per hour using a large ResMed Mirage Quattro full-face mask with heated humidifier. Bruxism was noted.   . Osteomyelitis of ankle and foot ([place])   . Retinopathy   . Type II diabetes mellitus (HCC)    w/complication NOS, type II   Past Surgical History:  Procedure Laterality Date  . ACHILLES TENDON LENGTHENING Left 03/21/2018   Procedure: ACHILLES TENDON  LENGTHENING;  Surgeon: Erle Crocker, MD;  Location: [facility];  Service: Orthopedics;  Laterality: Left;  . AMPUTATION Left 12/15/2014   Procedure: LEFT SECOND TOE AMPUTATION ;  Surgeon: Dorna Leitz, MD;  Location: [place];  Service: Orthopedics;  Laterality: Left;  . AMPUTATION Left 03/21/2018   Procedure: TRANSMETATARSAL AMPUTATION LEFT FOOT;  Surgeon: Erle Crocker, MD;  Location: [facility];  Service: Orthopedics;  Laterality: Left;  Popliteal & ankle block  . COLONOSCOPY WITH PROPOFOL N/A 01/27/2019   Procedure: COLONOSCOPY WITH PROPOFOL;  Surgeon: Carol Ada, MD;  Location: [facility];  Service: Endoscopy;  Laterality: N/A;  . POLYPECTOMY  01/27/2019   Procedure: POLYPECTOMY;  Surgeon: Carol Ada, MD;  Location: [facility];  Service: Endoscopy;;  . 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 Kathalene Frames. Mayer Camel.  . WOUND DEBRIDEMENT Left 06/15/2016   Procedure: DEBRIDEMENT WOUND LEFT FOOT WITH GRAFT APPLICATION;  Surgeon: Landis Martins, DPM;  Location: [place];  Service: Podiatry;  Laterality: Left;   Family History  Problem Relation Age of Onset  . Diabetes Mother        also 2 siblings  . Heart attack Father 30  . Throat cancer Brother    Social History:  reports that he quit smoking about 9 years ago. His smoking use included cigarettes. He has a 15.00 pack-year smoking history. He has never  used smokeless tobacco. He reports current alcohol use of about 11.0 standard drinks of alcohol per week. He reports that he does not use drugs. Allergies:  Allergies  Allergen Reactions  . Vancomycin     REACTION: ARF   Facility-Administered Medications Prior to Admission  Medication Dose Route Frequency Provider Last Rate Last Admin  . 0.9 %  sodium chloride infusion   Intravenous Continuous Jean Rosenthal, MD 100 mL/hr at 07/10/2019 1715 New Bag at 07/13/2019 1715   Medications Prior to  Admission  Medication Sig Dispense Refill  . albuterol (PROAIR HFA) 108 (90 Base) MCG/ACT inhaler Inhale 2 puffs into the lungs every 6 (six) hours as needed for wheezing or shortness of breath. 24.7 g 3  . AMITIZA 24 MCG capsule Take 24 mcg by mouth 2 (two) times daily.    Marland Kitchen apixaban (ELIQUIS) 5 MG TABS tablet Take 1 tablet (5 mg total) by mouth 2 (two) times daily. 180 tablet 1  . atorvastatin (LIPITOR) 20 MG tablet TAKE 1 TABLET(20 MG) BY MOUTH DAILY (Patient taking differently: Take 20 mg by mouth daily. ) 90 tablet 1  . Cholecalciferol (VITAMIN D3) 50 MCG (2000 UT) TABS Take 2,000 Units by mouth daily.    . enalapril (VASOTEC) 20 MG tablet TAKE 2 TABLETS(40 MG) BY MOUTH DAILY (Patient taking differently: Take 40 mg by mouth daily. ) 180 tablet 1  . furosemide (LASIX) 20 MG tablet Take 1 tablet (20 mg total) by mouth daily. 90 tablet 1  . gabapentin (NEURONTIN) 300 MG capsule TAKE 1 CAPSULE(300 MG) BY MOUTH AT BEDTIME (Patient taking differently: Take 300 mg by mouth at bedtime. ) 90 capsule 1  . HUMULIN R 500 UNIT/ML injection DRAW INSULIN TO THE 68 UNIT LINE ON THE U-100 SYRINGE(TO USE 340 UNITS) PER DAY AS DIRECTED (Patient taking differently: Inject into the skin See admin instructions. Draw insulin to the 68 units line on the U-100 Syringe(to use 340 units) per day as directed.) 20 mL 0  . HYDROcodone-acetaminophen (NORCO) 7.5-325 MG tablet Take 1 tablet by mouth every 8 (eight) hours as needed for moderate pain or severe pain. 15 tablet 0  . hydroxypropyl methylcellulose / hypromellose (ISOPTO TEARS / GONIOVISC) 2.5 % ophthalmic solution Place 1-2 drops into both eyes 3 (three) times daily as needed for dry eyes (dry/irritated eyes.).    Marland Kitchen LUMIGAN 0.01 % SOLN Place 1 drop into both eyes at bedtime.    . metoprolol succinate (TOPROL XL) 25 MG 24 hr tablet Take 1 tablet (25 mg total) by mouth daily. 30 tablet 11  . promethazine (PHENERGAN) 25 MG tablet TAKE 1 TABLET(25 MG) BY MOUTH EVERY 8  HOURS AS NEEDED FOR NAUSEA OR VOMITING (Patient taking differently: Take 25 mg by mouth every 8 (eight) hours as needed for nausea or vomiting. ) 90 tablet 0  . RESTASIS 0.05 % ophthalmic emulsion Place 1 drop into both eyes daily.    Marland Kitchen SIMBRINZA 1-0.2 % SUSP Place 1 drop into both eyes 2 (two) times daily.     Marland Kitchen ACCU-CHEK FASTCLIX LANCETS MISC Check blood sugar 5 times a day 408 each 3  . Blood Glucose Monitoring Suppl (ACCU-CHEK AVIVA PLUS) w/Device KIT CHECK BLOOD SUGAR 5 TIMES A DAY 1 kit 0  . Continuous Blood Gluc Sensor (FREESTYLE LIBRE 14 DAY SENSOR) MISC 1 each by Does not apply route 6 (six) times daily. 2 each 12  . glucose blood (ACCU-CHEK AVIVA PLUS) test strip USE TO CHECK BLOOD FIVE TIMES DAILY  450 each 1  . glucose blood (FREESTYLE PRECISION NEO TEST) test strip 1 each by Other route as needed for other. Use as instructed to check blood sugar 2 times daily. 100 each 3  . Insulin Disposable Pump (V-GO 20) KIT USE DAILY WITH INSULIN 30 kit 2  . INSULIN SYRINGE 1CC/29G 29G X 1/2" 1 ML MISC USE DAILY AS DIRECTED 100 each 0    Home: Home Living Family/patient expects to be discharged to:: Private residence Living Arrangements: Spouse/significant other Available Help at Discharge: Family, Available 24 hours/day Type of Home: House Home Access: Stairs to enter Home Layout: One level Bathroom Shower/Tub: Multimedia programmer: Handicapped height Bathroom Accessibility: Yes Home Equipment: [place] - single point, Grab bars - tub/shower  Functional History: Prior Function Level of Independence: Needs assistance Gait / Transfers Assistance Needed: ambulated with cane at home ADL's / Homemaking Assistance Needed: spouse assisted as needed with ADLs Communication / Swallowing Assistance Needed: no difficulty Functional Status:  Mobility: Bed Mobility Overal bed mobility: Needs Assistance Bed Mobility: Supine to Sit Rolling: Min guard Sidelying to sit: HOB elevated, Min  guard(HOB 15) Supine to sit: Supervision, HOB elevated General bed mobility comments: Use of bedrail Transfers Overall transfer level: Needs assistance Equipment used: Rolling walker (2 wheeled) Transfers: Sit to/from Stand, [employer] Transfers Sit to Stand: Min assist Stand pivot transfers: Min assist General transfer comment: Assist x 1 with RW. Noted 0 instances of LOB, however pt unsteady on feet.  Ambulation/Gait Ambulation/Gait assistance: Min guard Gait Distance (Feet): 4 Feet Assistive device: Rolling walker (2 wheeled) Gait Pattern/deviations: Step-to pattern, Decreased stride length, Trunk flexed General Gait Details: walked towards door and pulled recliner up behind him; he gets very anxious as his dyspnea increases and needs to sit quickly after he says he needs the chair Gait velocity: slow as instructed to help control his dyspnea    ADL: ADL Overall ADL's : Needs assistance/impaired Eating/Feeding: Modified independent Grooming: Set up Upper Body Bathing: Set up, Sitting Lower Body Bathing: Moderate assistance, Sit to/from stand Upper Body Dressing : Set up, Sitting Lower Body Dressing: Moderate assistance, Sit to/from stand Toilet Transfer: +2 for physical assistance, Minimal assistance, Stand-pivot Toilet Transfer Details (indicate cue type and reason): simulated Toileting- Clothing Manipulation and Hygiene: Supervision/safety, Set up [place] Manipulation Details (indicate cue type and reason): Pt able to use the urinal while seated EOB.  Functional mobility during ADLs: Minimal assistance, +2 for physical assistance, Rolling walker, Cueing for safety General ADL Comments: 3/4 DOE with minimal functional mobility  Cognition: Cognition Overall Cognitive Status: No family/caregiver present to determine baseline cognitive functioning Orientation Level: Oriented X4 Cognition Arousal/Alertness: Awake/alert Behavior During Therapy: WFL for tasks  assessed/performed Overall Cognitive Status: No family/caregiver present to determine baseline cognitive functioning General Comments: most likely baseline  Blood pressure (!) 158/65, pulse 81, temperature 97.9 F (36.6 C), temperature source Oral, resp. rate 13, height '6\' 6"'$  (1.981 m), weight (!) 182 kg, SpO2 94 %. Physical Exam  General: Alert and oriented x 3, No apparent distress HEENT: Head is normocephalic, atraumatic, PERRLA, EOMI, sclera anicteric, oral mucosa pink and moist, dentition intact, ext ear canals clear,  Neck: Supple without JVD or lymphadenopathy Heart: Reg rate and rhythm. No murmurs rubs or gallops Chest: CTA bilaterally without wheezes, rales, or rhonchi; + distress, decreased breath sounds Abdomen: Soft, non-tender, non-distended, bowel sounds positive. Morbidly obese. Extremities: No clubbing, cyanosis, or edema. Pulses are 2+ Skin: Clean and intact without signs of breakdown  Neuro: Pt is cognitively appropriate with normal insight, memory, and awareness. Cranial nerves 2-12 are intact. Sensory exam is normal. Reflexes are 2+ in all 4's. Fine motor coordination is intact. No tremors. Motor function is grossly 5/5.  Musculoskeletal: Full ROM, No pain with AROM or PROM in the neck, trunk, or extremities. Posture appropriate Psych: Pt's affect is appropriate. Pt is cooperative   Results for orders placed or performed during the hospital encounter of 07/06/2019 (from the past 24 hour(s))  Glucose, capillary     Status: Abnormal   Collection Time: 07/11/19 12:06 PM  Result Value Ref Range   Glucose-Capillary 116 (H) 70 - 99 mg/dL  Glucose, capillary     Status: Abnormal   Collection Time: 07/11/19  4:00 PM  Result Value Ref Range   Glucose-Capillary 159 (H) 70 - 99 mg/dL  Heparin level (unfractionated)     Status: None   Collection Time: 07/11/19  4:12 PM  Result Value Ref Range   Heparin Unfractionated 0.62 0.30 - 0.70 IU/mL  Glucose, capillary     Status:  Abnormal   Collection Time: 07/11/19  7:32 PM  Result Value Ref Range   Glucose-Capillary 172 (H) 70 - 99 mg/dL  Glucose, capillary     Status: Abnormal   Collection Time: 07/11/19  9:25 PM  Result Value Ref Range   Glucose-Capillary 136 (H) 70 - 99 mg/dL  Glucose, capillary     Status: Abnormal   Collection Time: 07/12/19 12:04 AM  Result Value Ref Range   Glucose-Capillary 116 (H) 70 - 99 mg/dL  C-reactive protein     Status: Abnormal   Collection Time: 07/12/19  1:43 AM  Result Value Ref Range   CRP 10.8 (H) <1.0 mg/dL  D-dimer, quantitative (not at [facility])     Status: Abnormal   Collection Time: 07/12/19  1:43 AM  Result Value Ref Range   D-Dimer, Quant 3.71 (H) 0.00 - 0.50 ug/mL-FEU  Comprehensive metabolic panel     Status: Abnormal   Collection Time: 07/12/19  1:43 AM  Result Value Ref Range   Sodium 137 135 - 145 mmol/L   Potassium 4.7 3.5 - 5.1 mmol/L   Chloride 105 98 - 111 mmol/L   CO2 24 22 - 32 mmol/L   Glucose, Bld 111 (H) 70 - 99 mg/dL   BUN 29 (H) 8 - 23 mg/dL   Creatinine, Ser 1.86 (H) 0.61 - 1.24 mg/dL   Calcium 8.6 (L) 8.9 - 10.3 mg/dL   Total Protein 5.7 (L) 6.5 - 8.1 g/dL   Albumin 2.1 (L) 3.5 - 5.0 g/dL   AST 29 15 - 41 U/L   ALT 35 0 - 44 U/L   Alkaline Phosphatase 77 38 - 126 U/L   Total Bilirubin 0.7 0.3 - 1.2 mg/dL   GFR calc non Af Amer 36 (L) >60 mL/min   GFR calc Af Amer 42 (L) >60 mL/min   Anion gap 8 5 - 15  CBC with Differential/Platelet     Status: Abnormal   Collection Time: 07/12/19  1:43 AM  Result Value Ref Range   WBC 7.7 4.0 - 10.5 K/uL   RBC 2.83 (L) 4.22 - 5.81 MIL/uL   Hemoglobin 7.9 (L) 13.0 - 17.0 g/dL   HCT 26.0 (L) 39.0 - 52.0 %   MCV 91.9 80.0 - 100.0 fL   MCH 27.9 26.0 - 34.0 pg   MCHC 30.4 30.0 - 36.0 g/dL   RDW 15.7 (H) 11.5 - 15.5 %  Platelets 172 150 - 400 K/uL   nRBC 0.0 0.0 - 0.2 %   Neutrophils Relative % 79 %   Neutro Abs 6.0 1.7 - 7.7 K/uL   Lymphocytes Relative 11 %   Lymphs Abs 0.8 0.7 - 4.0 K/uL    Monocytes Relative 7 %   Monocytes Absolute 0.6 0.1 - 1.0 K/uL   Eosinophils Relative 2 %   Eosinophils Absolute 0.1 0.0 - 0.5 K/uL   Basophils Relative 0 %   Basophils Absolute 0.0 0.0 - 0.1 K/uL   Immature Granulocytes 1 %   Abs Immature Granulocytes 0.11 (H) 0.00 - 0.07 K/uL  Magnesium     Status: None   Collection Time: 07/12/19  1:43 AM  Result Value Ref Range   Magnesium 1.7 1.7 - 2.4 mg/dL  Phosphorus     Status: None   Collection Time: 07/12/19  1:43 AM  Result Value Ref Range   Phosphorus 4.3 2.5 - 4.6 mg/dL  Ferritin     Status: Abnormal   Collection Time: 07/12/19  1:43 AM  Result Value Ref Range   Ferritin 347 (H) 24 - 336 ng/mL  Heparin level (unfractionated)     Status: Abnormal   Collection Time: 07/12/19  1:43 AM  Result Value Ref Range   Heparin Unfractionated 0.27 (L) 0.30 - 0.70 IU/mL  Glucose, capillary     Status: None   Collection Time: 07/12/19  5:00 AM  Result Value Ref Range   Glucose-Capillary 93 70 - 99 mg/dL  Glucose, capillary     Status: None   Collection Time: 07/12/19  7:10 AM  Result Value Ref Range   Glucose-Capillary 97 70 - 99 mg/dL   *Note: Due to a large number of results and/or encounters for the requested time period, some results have not been displayed. A complete set of results can be found in Results Review.   No results found.   Assessment/Plan: Diagnosis: Impaired mobility and ADLs following COVID-19.  1. Does the need for close, 24 hr/day medical supervision in concert with the patient's rehab needs make it unreasonable for this patient to be served in a less intensive setting? Yes 2. Co-Morbidities requiring supervision/potential complications: DM2 with peripheral neuropathy, OSA, morbid obesity, CKD stage 3, afib, HTN, chronic pain syndrome, and anemia 3. Due to bladder management, bowel management, safety, skin/wound care, disease management, medication administration, pain management and patient education, does the patient  require 24 hr/day rehab nursing? Yes 4. Does the patient require coordinated care of a physician, rehab nurse, therapy disciplines of PT, OT to address physical and functional deficits in the context of the above medical diagnosis(es)? Yes Addressing deficits in the following areas: balance, endurance, locomotion, strength, transferring, bathing, dressing, feeding, grooming and toileting 5. Can the patient actively participate in an intensive therapy program of at least 3 hrs of therapy per day at least 5 days per week? Yes 6. The potential for patient to make measurable gains while on inpatient rehab is good 7. Anticipated functional outcomes upon discharge from inpatient rehab are modified independent  with PT, modified independent with OT, independent with SLP. 8. Estimated rehab length of stay to reach the above functional goals is: 2-3 weeks 9. Anticipated discharge destination: Home 10. Overall Rehab/Functional Prognosis: good  RECOMMENDATIONS: This patient's condition is appropriate for continued rehabilitative care in the following setting: CIR Patient has agreed to participate in recommended program. Yes Note that insurance prior authorization may be required for reimbursement for recommended care.  Comment:  Mr. Recinos would be a good candidate for inpatient rehab once 20 days out from COVID-19 diagnosis (1/4) or after receiving 2 negative COVID-19 tests, and once no longer requiring a heparin drip.   Poor sleep quality: Can consider Temazepam prn for him to take when he wakes at night to help him return to sleep.   Constipation: Consider making senna-docusate standing.   Izora Ribas, MD 07/12/2019

## 2019-07-13 LAB — MAGNESIUM: Magnesium: 1.6 mg/dL — ABNORMAL LOW (ref 1.7–2.4)

## 2019-07-13 LAB — COMPREHENSIVE METABOLIC PANEL
ALT: 32 U/L (ref 0–44)
AST: 26 U/L (ref 15–41)
Albumin: 2.1 g/dL — ABNORMAL LOW (ref 3.5–5.0)
Alkaline Phosphatase: 78 U/L (ref 38–126)
Anion gap: 10 (ref 5–15)
BUN: 24 mg/dL — ABNORMAL HIGH (ref 8–23)
CO2: 23 mmol/L (ref 22–32)
Calcium: 8.5 mg/dL — ABNORMAL LOW (ref 8.9–10.3)
Chloride: 104 mmol/L (ref 98–111)
Creatinine, Ser: 1.58 mg/dL — ABNORMAL HIGH (ref 0.61–1.24)
GFR calc Af Amer: 51 mL/min — ABNORMAL LOW (ref >60)
GFR calc non Af Amer: 44 mL/min — ABNORMAL LOW (ref >60)
Glucose, Bld: 123 mg/dL — ABNORMAL HIGH (ref 70–99)
Potassium: 4.6 mmol/L (ref 3.5–5.1)
Sodium: 137 mmol/L (ref 135–145)
Total Bilirubin: 0.8 mg/dL (ref 0.3–1.2)
Total Protein: 5.8 g/dL — ABNORMAL LOW (ref 6.5–8.1)

## 2019-07-13 LAB — CBC WITH DIFFERENTIAL/PLATELET
Abs Immature Granulocytes: 0.11 10*3/uL — ABNORMAL HIGH (ref 0.00–0.07)
Basophils Absolute: 0 10*3/uL (ref 0.0–0.1)
Basophils Relative: 0 %
Eosinophils Absolute: 0.2 10*3/uL (ref 0.0–0.5)
Eosinophils Relative: 2 %
HCT: 26.9 % — ABNORMAL LOW (ref 39.0–52.0)
Hemoglobin: 8.3 g/dL — ABNORMAL LOW (ref 13.0–17.0)
Immature Granulocytes: 1 %
Lymphocytes Relative: 11 %
Lymphs Abs: 0.8 10*3/uL (ref 0.7–4.0)
MCH: 28.9 pg (ref 26.0–34.0)
MCHC: 30.9 g/dL (ref 30.0–36.0)
MCV: 93.7 fL (ref 80.0–100.0)
Monocytes Absolute: 0.6 10*3/uL (ref 0.1–1.0)
Monocytes Relative: 8 %
Neutro Abs: 6.1 10*3/uL (ref 1.7–7.7)
Neutrophils Relative %: 78 %
Platelets: 161 10*3/uL (ref 150–400)
RBC: 2.87 MIL/uL — ABNORMAL LOW (ref 4.22–5.81)
RDW: 15.8 % — ABNORMAL HIGH (ref 11.5–15.5)
WBC: 7.9 10*3/uL (ref 4.0–10.5)
nRBC: 0 % (ref 0.0–0.2)

## 2019-07-13 LAB — GLUCOSE, CAPILLARY
Glucose-Capillary: 117 mg/dL — ABNORMAL HIGH (ref 70–99)
Glucose-Capillary: 123 mg/dL — ABNORMAL HIGH (ref 70–99)
Glucose-Capillary: 125 mg/dL — ABNORMAL HIGH (ref 70–99)
Glucose-Capillary: 118 mg/dL — ABNORMAL HIGH (ref 70–99)
Glucose-Capillary: 135 mg/dL — ABNORMAL HIGH (ref 70–99)
Glucose-Capillary: 120 mg/dL — ABNORMAL HIGH (ref 70–99)

## 2019-07-13 LAB — PHOSPHORUS: Phosphorus: 4 mg/dL (ref 2.5–4.6)

## 2019-07-13 LAB — HEPARIN LEVEL (UNFRACTIONATED)
Heparin Unfractionated: 0.28 IU/mL — ABNORMAL LOW (ref 0.30–0.70)
Heparin Unfractionated: 0.41 IU/mL (ref 0.30–0.70)
Heparin Unfractionated: 0.49 IU/mL (ref 0.30–0.70)

## 2019-07-13 LAB — D-DIMER, QUANTITATIVE: D-Dimer, Quant: 3.19 ug/mL-FEU — ABNORMAL HIGH (ref 0.00–0.50)

## 2019-07-13 LAB — FERRITIN: Ferritin: 288 ng/mL (ref 24–336)

## 2019-07-13 LAB — C-REACTIVE PROTEIN: CRP: 10.5 mg/dL — ABNORMAL HIGH (ref <1.0)

## 2019-07-13 MED ORDER — MAGNESIUM SULFATE 2 GM/50ML IV SOLN
2.0000 g | Freq: Once | INTRAVENOUS | Status: AC
  Administered 2019-07-13: 2 g via INTRAVENOUS
  Filled 2019-07-13: qty 50

## 2019-07-13 MED ORDER — SODIUM CHLORIDE 0.9 % IV SOLN: INTRAVENOUS | Status: DC

## 2019-07-13 NOTE — Progress Notes (Signed)
ANTICOAGULATION CONSULT NOTE - Follow Up Consult  Pharmacy Consult for Heparin Indication: Afib (Eliquis PTA, r/o PE with increased d-dimer)  Allergies  Allergen Reactions  . Vancomycin     REACTION: ARF    Patient Measurements: Height: 6\' 6"  (198.1 cm) Weight: (!) 401 lb 3.8 oz (182 kg) IBW/kg (Calculated) : 91.4 Heparin Dosing Weight:   Vital Signs: Temp: 97.7 F (36.5 C) (12/31 0353) Temp Source: Oral (12/31 0353) BP: 152/67 (12/31 0353) Pulse Rate: 72 (12/31 0353)  Labs: Recent Labs    07/11/19 0435 07/12/19 0143 07/12/19 0957 07/12/19 1625 07/13/19 0230  HGB 8.1* 7.9*  --   --  8.3*  HCT 26.8* 26.0*  --   --  26.9*  PLT 161 172  --   --  161  HEPARINUNFRC 0.78* 0.27* 0.37 0.24* 0.28*  CREATININE 1.79* 1.86*  --   --  1.58*    Estimated Creatinine Clearance: 79.6 mL/min (A) (by C-G formula based on SCr of 1.58 mg/dL (H)).   Assessment: 68 y.o. M on Eliquis 5mg  BID for PAF. Pt reported to MD about missing several Eliquis doses PTA. Changed to full-dose Lovenox 180mg  SQ q12h for r/o PE. Dopplers negative. Anti-Xa remains > 2 despite holding Lovenox.   Anticoagulation was on hold for several days due to chest wall hemotoma. However, due to concern with increasing d-dimer and a hx of afib, heparin was ordered to be resumed with a lower goal. We will do it without bolus. He is morbidly obese.   07/13/19 0545 UPDATE Heparin level: 0.28 IU/mL, just slightly below therapeutic goal range D-Dimer down to 3.59mcg/ml-FEU and Hb is up to 8.3mg /dL RN reports no issues with bleeding or problems with infusion site  Goal of Therapy:  Heparin level 0.3 - 0.5 units/ml Monitor platelets by anticoagulation protocol: Yes   Plan:   Increase heparin infusion rate to 1850 units/hr   Re-check heparin level in ~8hr  Monitor for s/sx of bleeding  Thank you for involving pharmacy in this patient's care.  Despina Pole, Pharm. D. Clinical Pharmacist 07/13/2019 5:46  AM

## 2019-07-13 NOTE — Progress Notes (Signed)
Patient not willing to get up and walk this last evening before bedtime.  Pt c/o pain 7/10, was given hydrocodone per request.  Able to turn down patient from 8 to 6L HFNC, sats maintained >88%.

## 2019-07-13 NOTE — Progress Notes (Signed)
ANTICOAGULATION CONSULT NOTE - Follow Up Consult  Pharmacy Consult for Heparin Indication: Afib (Eliquis PTA, r/o PE with increased d-dimer)  Allergies  Allergen Reactions  . Vancomycin     REACTION: ARF    Patient Measurements: Height: 6\' 6"  (198.1 cm) Weight: (!) 401 lb 3.8 oz (182 kg) IBW/kg (Calculated) : 91.4 Heparin Dosing Weight:   Vital Signs: Temp: 97.9 F (36.6 C) (12/31 1140) Temp Source: Oral (12/31 1140) BP: 140/58 (12/31 1140) Pulse Rate: 65 (12/31 1140)  Labs: Recent Labs    07/11/19 0435 07/12/19 0143 07/12/19 1625 07/13/19 0230 07/13/19 1403  HGB 8.1* 7.9*  --  8.3*  --   HCT 26.8* 26.0*  --  26.9*  --   PLT 161 172  --  161  --   HEPARINUNFRC 0.78* 0.27* 0.24* 0.28* 0.41  CREATININE 1.79* 1.86*  --  1.58*  --     Estimated Creatinine Clearance: 79.6 mL/min (A) (by C-G formula based on SCr of 1.58 mg/dL (H)).   Assessment: 68 y.o. M on Eliquis 5mg  BID for PAF. Pt reported to MD about missing several Eliquis doses PTA. Changed to full-dose Lovenox 180mg  SQ q12h for r/o PE. Dopplers negative. Anti-Xa remains > 2 despite holding Lovenox.   Anticoagulation was on hold for several days due to chest wall hemotoma. However, due to concern with increasing d-dimer and a hx of afib, heparin was ordered to be resumed with a lower goal. We will do it without bolus. He is morbidly obese.   Heparin level is therapeutic at 0.41 on 1850 units/hr. No bleeding noted but has chest wall hematoma being watched, Hgb up to 8.3, platelets are normal.   Goal of Therapy:  Heparin level 0.3 - 0.5 units/ml Monitor platelets by anticoagulation protocol: Yes   Plan:   Continue heparin infusion rate at 1850 units/hr   Re-check heparin level in ~6hr  Monitor for s/sx of bleeding  Thank you for involving pharmacy in this patient's care.  Renold Genta, PharmD, BCPS Clinical Pharmacist Clinical phone for 07/13/2019 until 4p is [phone] 07/13/2019 3:48  PM  **Pharmacist phone directory can be found on [email or web address] listed under [place]**

## 2019-07-13 NOTE — Progress Notes (Signed)
PROGRESS NOTE    [name]  [id number] DOB: 1951-06-25 DOA: 07/13/2019 PCP: Aldine Contes, MD   Brief Narrative:  68 yo BM PMHx diabetes type 2 uncontrolled with complication, diabetic peripheral neuropathy, OSA/OHS, morbid obesity, CKD STAGE III, atrial fibrillation, HTN, atrial fibrillation on Eliquis, chronic pain syndrome, anemia, atrial fibrillation on Eliquis and multiple other medical problems   Presented on 12/15 with 1 week of SOB, difficulty sleeping, poor PO intake, and presyncopal symptoms.  He was admitted from the internal medicine clinic.  He's received steroids and remdesivir.  Plan for plasma today.    Subjective: 12/31 A/O x4, negative CP, negative abdominal pain.  Positive S OB.  Sitting in chair comfortably.  States CIR stop by to evaluate him today and believes he would be a good candidate.    Assessment & Plan:   Principal Problem:   COVID-19 with multiple comorbidities Active Problems:   Diabetes type 2, uncontrolled ([place])   Morbid obesity ([place])   Hypertension   Paroxysmal atrial fibrillation with RVR (HCC)   Respiratory failure with hypoxia (HCC)   Pneumonia due to COVID-19 virus   Acute respiratory failure with hypoxia (HCC)   Chest wall hematoma, left, subsequent encounter   Elevated d-dimer   Atrial fibrillation with RVR ([place])   Diabetes mellitus type 2, uncontrolled, with complications ([place])   Diabetic neuropathy ([place])   Obesity hypoventilation syndrome (HCC)   OSA on CPAP   Morbid obesity with BMI of 70 and over, adult ([place])   Covid pneumonia/acute respiratory failure with hypoxia COVID-19 Labs  Recent Labs    07/11/19 0435 07/12/19 0143 07/13/19 0230  DDIMER 3.96* 3.71* 3.19*  FERRITIN 322 347* 288  CRP 11.2* 10.8* 10.5*    Lab Results  Component Value Date   SARSCOV2NAA POSITIVE (A) 07/11/2019   [place] NEGATIVE 01/24/2019  -Completed course Remdesivir -Complete course Steroids -12/19 transfuse 1 unit  Covid convalescent plasma  LEFT Chest Wall Hematoma  -Unable to appreciate on exam secondary to patient's habitus -12/27 repeat CT chest; continues to show hematoma.  However given patient's rising D-dimer and A. fib will start heparin drip, maintain on low side of therapeutic range. Recent Labs  Lab 07/09/19 0247 07/10/19 0211 07/11/19 0435 07/12/19 0143 07/13/19 0230  HGB 8.8* 8.2* 8.1* 7.9* 8.3*  -Stable  Elevated D-dimer -Elevated D-dimer while on Eliquis, he did miss some days while he was sick.  He was transitioned to full dose lovenox.   -LE [place] was negative for DVT and echo was as follows (normal RV systolic function, moderately elevated PASP, low normal EF - see report).  -See A. fib  A. fib with RVR -Currently NSR -Amlodipine 10 mg daily -12/30 increase Metoprolol 37.5 mg QID -12/27 Heparin drip restarted lowest therapeutic dosing per pharmacy -Would give patient another 48 hours and then restart Eliquis   Essential HTN -See A. fib  Acute on CKD stage III (baseline Cr 1.5) Recent Labs  Lab 07/09/19 0247 07/10/19 0211 07/11/19 0435 07/12/19 0143 07/13/19 0230  CREATININE 1.72* 1.85* 1.79* 1.86* 1.58*  -Continue to hold ACE I -Strict in and out -16.8 L -Daily weight Filed Weights   06/28/19 0443 06/30/19 1900  Weight: (!) 182.4 kg (!) 182 kg  -12/31 believe patient may be a little too dry normal saline 30ml/hr -12/31 patient improving with gentle hydration  Hypomagnesmia -Magnesium level> 2 -Magnesium 2 g  Elevated LFT -Acute hepatitis panel negative -Resolved  Diabetes type 2 uncontrolled with complication/Diabetic Neuropathy -At home on  U500 via pump; Per EMR uses 70 units which is~350 units of U100 insulin -12/27 decrease NovoLog 10 units qac -12/28 decrease Lantus 40 units daily -12/28 decrease moderate SSI  -Tradjenta 5 mg daily  [name] -Lipitor 20 mg daily -12/28 LDL= 35  OSA/OHS -On CPAP at home (on hold here) -See Covid pneumonia    Morbid obesity (BMI 46.37 kg/m) -12/27 nutrition consult morbidly obese, uncontrolled diabetes  Insomnia -trazodone prn qhs  Thrombocytopenia: -Resolved  Glaucoma:  -Continue home eye drops  Pressure injury right ear Pressure Injury 07/09/19 Ear Right Stage 2 -  Partial thickness loss of dermis presenting as a shallow open injury with a red, pink wound bed without slough. Red (Active)  07/09/19 0640  Location: Ear  Location Orientation: Right  Staging: Stage 2 -  Partial thickness loss of dermis presenting as a shallow open injury with a red, pink wound bed without slough.  Wound Description (Comments): Red  Present on Admission: No   Goals of care -12/29 consult to CIR for placement; patient must be 20 days out from most recent positive Covid test to be accepted.     DVT prophylaxis: Heparin drip Code Status: Full Family Communication: 12/28 spoke with Early (wife) explained plan of care answered all questions Disposition Plan: TBD   Consultants:    Procedures/Significant Events:  12/19 Echo;LVEF=50 to 55%.-negative regional wall abnormality..  -Moderately elevated pulmonary artery systolic pressure. 12/20 bilateral lower extremity Doppler; negative for DVT/SVT 12/24 CT chest Wo contrast;-patchy BILATERAL airspace infiltrates consistent with multifocal pneumonia and COVID-19. -Dffuse edema and enlargement of the LEFT pectoralis muscles especially pectoralis minor with surrounding infiltrative changes extending into the LEFT lateral chest wall. Favor the above changes being related to chest wall hemorrhage/hematoma though unable to exclude infection by CT. -Aortic Atherosclerosis 12/27 CT chest Wo contrast;-interval decrease in size of the left pectoralis minor muscle suggesting resolving edema/hemorrhage. No discrete hematoma at this time. -Persistent enlargement of the left pectoralis major muscle with edema/hemorrhage tracking in the subcutaneous fat of the  anterior left chest wall, similar to prior. -Similar appearance of the widespread patchy airspace disease compatible with multifocal pneumonia. -Hepatic steatosis    I have personally reviewed and interpreted all radiology studies and my findings are as above.  VENTILATOR SETTINGS: HFNC 12/31 Flow; 8 L/min SPO2; 98%    Cultures   Antimicrobials: Anti-infectives (From admission, onward)   Start     Dose/Rate Stop   06/28/19 2100  remdesivir 100 mg in sodium chloride 0.9 % 100 mL IVPB     100 mg 200 mL/hr over 30 Minutes 07/01/19 2107   07/02/2019 2100  remdesivir 200 mg in sodium chloride 0.9% 250 mL IVPB     200 mg 580 mL/hr over 30 Minutes 06/28/19 0823       Devices    LINES / TUBES:      Continuous Infusions: . sodium chloride Stopped (07/01/19 0355)  . heparin 1,850 Units/hr (07/13/19 0549)     Objective: Vitals:   07/12/19 1536 07/12/19 2101 07/13/19 0353 07/13/19 0737  BP: (!) 182/77 (!) 145/74 (!) 152/67 (!) 145/78  Pulse: 81 69 72 75  Resp: 20 16 19 19   Temp: 97.7 F (36.5 C) 98.9 F (37.2 C) 97.7 F (36.5 C) 98.3 F (36.8 C)  TempSrc: Oral Oral Oral Oral  SpO2: 94% 95% 90% 95%  Weight:      Height:        Intake/Output Summary (Last 24 hours) at 07/13/2019 0944 Last data  filed at 07/12/2019 1826 Gross per 24 hour  Intake 1587.83 ml  Output 2350 ml  Net -762.17 ml   Filed Weights   06/28/19 0443 06/30/19 1900  Weight: (!) 182.4 kg (!) 182 kg  Physical Exam:  General: A/O x4, positive  acute respiratory distress Eyes: negative scleral hemorrhage, negative anisocoria, negative icterus ENT: Negative Runny nose, negative gingival bleeding, Neck:  Negative scars, masses, torticollis, lymphadenopathy, JVD Lungs: Creased breath sounds bilaterally without wheezes or crackles Cardiovascular: Regular rate and rhythm without murmur gallop or rub normal S1 and S2 Abdomen: Morbidly obese negative abdominal pain, nondistended, positive soft,  bowel sounds, no rebound, no ascites, no appreciable mass Extremities: No significant cyanosis, clubbing, or edema bilateral lower extremities Skin: Negative rashes, lesions, ulcers Psychiatric:  Negative depression, negative anxiety, negative fatigue, negative mania  Central nervous system:  Cranial nerves II through XII intact, tongue/uvula midline, all extremities muscle strength 5/5, sensation intact throughout, negative dysarthria, negative expressive aphasia, negative receptive aphasia.      Data Reviewed: Care during the described time interval was provided by me .  I have reviewed this patient's available data, including medical history, events of note, physical examination, and all test results as part of my evaluation.   CBC: Recent Labs  Lab 07/09/19 0247 07/10/19 0211 07/11/19 0435 07/12/19 0143 07/13/19 0230  WBC 11.1* 8.9 8.2 7.7 7.9  NEUTROABS 9.6* 7.4 6.8 6.0 6.1  HGB 8.8* 8.2* 8.1* 7.9* 8.3*  HCT 28.3* 26.5* 26.8* 26.0* 26.9*  MCV 91.9 92.0 92.1 91.9 93.7  PLT 148* 136* 161 172 [id number]   Basic Metabolic Panel: Recent Labs  Lab 07/09/19 0247 07/10/19 0211 07/11/19 0435 07/12/19 0143 07/13/19 0230  NA 141 138 138 137 137  K 4.7 4.8 4.5 4.7 4.6  CL 101 104 102 105 104  CO2 25 25 24 24 23   GLUCOSE 48* 108* 97 111* 123*  BUN 39* 37* 30* 29* 24*  CREATININE 1.72* 1.85* 1.79* 1.86* 1.58*  CALCIUM 8.3* 8.5* 8.7* 8.6* 8.5*  MG 2.2 1.8 1.7 1.7 1.6*  PHOS 4.6 3.9 4.1 4.3 4.0   GFR: Estimated Creatinine Clearance: 79.6 mL/min (A) (by C-G formula based on SCr of 1.58 mg/dL (H)). Liver Function Tests: Recent Labs  Lab 07/09/19 0247 07/10/19 0211 07/11/19 0435 07/12/19 0143 07/13/19 0230  AST 29 35 32 29 26  ALT 31 31 32 35 32  ALKPHOS 66 68 72 77 78  BILITOT 1.1 0.8 0.9 0.7 0.8  PROT 5.8* 5.4* 5.7* 5.7* 5.8*  ALBUMIN 2.3* 2.0* 2.1* 2.1* 2.1*   No results for input(s): LIPASE, AMYLASE in the last 168 hours. No results for input(s): AMMONIA in the last 168  hours. Coagulation Profile: Recent Labs  Lab 07/06/19 2200  INR 1.2   Cardiac Enzymes: No results for input(s): CKTOTAL, CKMB, CKMBINDEX, TROPONINI in the last 168 hours. BNP (last 3 results) No results for input(s): PROBNP in the last 8760 hours. HbA1C: No results for input(s): HGBA1C in the last 72 hours. CBG: Recent Labs  Lab 07/12/19 1550 07/12/19 2035 07/13/19 0052 07/13/19 0351 07/13/19 0739  GLUCAP 165* 146* 117* 123* 125*   Lipid Profile: No results for input(s): CHOL, HDL, LDLCALC, TRIG, CHOLHDL, LDLDIRECT in the last 72 hours. Thyroid Function Tests: No results for input(s): TSH, T4TOTAL, FREET4, T3FREE, THYROIDAB in the last 72 hours. Anemia Panel: Recent Labs    07/12/19 0143 07/13/19 0230  FERRITIN 347* 288   Urine analysis:    Component Value Date/Time  COLORURINE YELLOW 05/17/2015 1025   APPEARANCEUR CLEAR 05/17/2015 1025   LABSPEC >=1.030 (A) 05/17/2015 1025   PHURINE 5.5 05/17/2015 1025   GLUCOSEU NEGATIVE 05/17/2015 1025   HGBUR MODERATE (A) 05/17/2015 1025   BILIRUBINUR NEGATIVE 05/17/2015 1025   KETONESUR NEGATIVE 05/17/2015 1025   PROTEINUR NEGATIVE 12/13/2014 2120   UROBILINOGEN 0.2 05/17/2015 1025   NITRITE NEGATIVE 05/17/2015 1025   LEUKOCYTESUR NEGATIVE 05/17/2015 1025   Sepsis Labs: @LABRCNTIP (procalcitonin:4,lacticidven:4)  )No results found for this or any previous visit (from the past 240 hour(s)).       Radiology Studies: No results found.      Scheduled Meds: . amLODipine  10 mg Oral Daily  . atorvastatin  20 mg Oral q1800  . Chlorhexidine Gluconate Cloth  6 each Topical Daily  . cycloSPORINE  1 drop Both Eyes Daily  . gabapentin  300 mg Oral QHS  . insulin aspart  0-15 Units Subcutaneous Q4H  . insulin aspart  10 Units Subcutaneous TID WC  . insulin glargine  40 Units Subcutaneous Daily  . latanoprost  1 drop Both Eyes QHS  . linagliptin  5 mg Oral Daily  . mouth rinse  15 mL Mouth Rinse BID  . metoprolol  tartrate  37.5 mg Oral Q6H  . pantoprazole  40 mg Oral Daily  . sodium chloride flush  3 mL Intravenous Q12H   Continuous Infusions: . sodium chloride Stopped (07/01/19 0355)  . heparin 1,850 Units/hr (07/13/19 0549)     LOS: 16 days   The patient is critically ill with multiple organ systems failure and requires high complexity decision making for assessment and support, frequent evaluation and titration of therapies, application of advanced monitoring technologies and extensive interpretation of multiple databases. Critical Care Time devoted to patient care services described in this note  Time spent: 40 minutes     Andrue Dini, Geraldo Docker, MD [facility] Pager [phone]  If 7PM-7AM, please contact night-coverage [email or web address] Password [facility] 07/13/2019, 9:44 AM

## 2019-07-13 NOTE — Progress Notes (Signed)
PT Cancellation Note  Patient Details Name: [name] MRN: [id number] DOB: 07-16-1950   Cancelled Treatment:    Reason Eval/Treat Not Completed: Patient declined, patient in recliner,  Patient declined x 2 separate attempts siting his abdomen is unsettled, his buttocks are sore. Encouraged patient to at least stand up, patient declined.  Claretha Cooper 07/13/2019, 2:21 PM  [place] Pager [phone] Office [phone]'

## 2019-07-13 NOTE — Progress Notes (Addendum)
ANTICOAGULATION CONSULT NOTE - Follow Up Consult  Pharmacy Consult for Heparin Indication: Afib (Eliquis PTA, r/o PE with increased d-dimer)  Allergies  Allergen Reactions  . Vancomycin     REACTION: ARF    Patient Measurements: Height: 6\' 6"  (198.1 cm) Weight: (!) 401 lb 3.8 oz (182 kg) IBW/kg (Calculated) : 91.4 Heparin Dosing Weight:   Vital Signs: Temp: 98.1 F (36.7 C) (12/31 1912) Temp Source: Oral (12/31 1912) BP: 141/63 (12/31 1912) Pulse Rate: 69 (12/31 1912)  Labs: Recent Labs    07/11/19 0435 07/12/19 0143 07/13/19 0230 07/13/19 1403 07/13/19 2115  HGB 8.1* 7.9* 8.3*  --   --   HCT 26.8* 26.0* 26.9*  --   --   PLT 161 172 161  --   --   HEPARINUNFRC 0.78* 0.27* 0.28* 0.41 0.49  CREATININE 1.79* 1.86* 1.58*  --   --     Estimated Creatinine Clearance: 79.6 mL/min (A) (by C-G formula based on SCr of 1.58 mg/dL (H)).   Assessment: 68 y.o. M on Eliquis 5mg  BID for PAF. Pt reported to MD about missing several Eliquis doses PTA. Changed to full-dose Lovenox 180mg  SQ q12h for r/o PE. Dopplers negative. Anti-Xa remains > 2 despite holding Lovenox.   Anticoagulation was on hold for several days due to chest wall hemotoma. However, due to concern with increasing d-dimer and a hx of afib, heparin was ordered to be resumed with a lower goal. We will do it without bolus. He is morbidly obese.   07/13/19 2300 UPDATE Heparin level: 0.49 IU/mL, at high end of therapeutic goal range Per RN,  no bleeding noted but has chest wall hematoma being watched   Goal of Therapy:  Heparin level 0.3 - 0.5 units/ml Monitor platelets by anticoagulation protocol: Yes   Plan:   Continue heparin rate at 1850 units/hr   Daily heparin level  Monitor for s/sx of bleeding  Thank you for allowing pharmacy to participate in this patient's care.  Despina Pole, Pharm. D. Clinical Pharmacist 07/13/2019 10:53 PM

## 2019-07-14 DIAGNOSIS — J1282 Pneumonia due to coronavirus disease 2019: Secondary | ICD-10-CM

## 2019-07-14 LAB — COMPREHENSIVE METABOLIC PANEL
ALT: 32 U/L (ref 0–44)
AST: 25 U/L (ref 15–41)
Albumin: 2 g/dL — ABNORMAL LOW (ref 3.5–5.0)
Alkaline Phosphatase: 87 U/L (ref 38–126)
Anion gap: 8 (ref 5–15)
BUN: 24 mg/dL — ABNORMAL HIGH (ref 8–23)
CO2: 24 mmol/L (ref 22–32)
Calcium: 8.2 mg/dL — ABNORMAL LOW (ref 8.9–10.3)
Chloride: 106 mmol/L (ref 98–111)
Creatinine, Ser: 1.85 mg/dL — ABNORMAL HIGH (ref 0.61–1.24)
GFR calc Af Amer: 42 mL/min — ABNORMAL LOW (ref >60)
GFR calc non Af Amer: 36 mL/min — ABNORMAL LOW (ref >60)
Glucose, Bld: 103 mg/dL — ABNORMAL HIGH (ref 70–99)
Potassium: 4.8 mmol/L (ref 3.5–5.1)
Sodium: 138 mmol/L (ref 135–145)
Total Bilirubin: 0.9 mg/dL (ref 0.3–1.2)
Total Protein: 5.7 g/dL — ABNORMAL LOW (ref 6.5–8.1)

## 2019-07-14 LAB — CBC WITH DIFFERENTIAL/PLATELET
Abs Immature Granulocytes: 0.06 10*3/uL (ref 0.00–0.07)
Basophils Absolute: 0 10*3/uL (ref 0.0–0.1)
Basophils Relative: 0 %
Eosinophils Absolute: 0.2 10*3/uL (ref 0.0–0.5)
Eosinophils Relative: 3 %
HCT: 25.6 % — ABNORMAL LOW (ref 39.0–52.0)
Hemoglobin: 7.7 g/dL — ABNORMAL LOW (ref 13.0–17.0)
Immature Granulocytes: 1 %
Lymphocytes Relative: 11 %
Lymphs Abs: 0.8 10*3/uL (ref 0.7–4.0)
MCH: 28.5 pg (ref 26.0–34.0)
MCHC: 30.1 g/dL (ref 30.0–36.0)
MCV: 94.8 fL (ref 80.0–100.0)
Monocytes Absolute: 0.5 10*3/uL (ref 0.1–1.0)
Monocytes Relative: 7 %
Neutro Abs: 5.6 10*3/uL (ref 1.7–7.7)
Neutrophils Relative %: 78 %
Platelets: 173 10*3/uL (ref 150–400)
RBC: 2.7 MIL/uL — ABNORMAL LOW (ref 4.22–5.81)
RDW: 15.9 % — ABNORMAL HIGH (ref 11.5–15.5)
WBC: 7.2 10*3/uL (ref 4.0–10.5)
nRBC: 0 % (ref 0.0–0.2)

## 2019-07-14 LAB — GLUCOSE, CAPILLARY
Glucose-Capillary: 106 mg/dL — ABNORMAL HIGH (ref 70–99)
Glucose-Capillary: 121 mg/dL — ABNORMAL HIGH (ref 70–99)
Glucose-Capillary: 124 mg/dL — ABNORMAL HIGH (ref 70–99)
Glucose-Capillary: 80 mg/dL (ref 70–99)
Glucose-Capillary: 91 mg/dL (ref 70–99)

## 2019-07-14 LAB — FERRITIN: Ferritin: 104 ng/mL (ref 24–336)

## 2019-07-14 LAB — D-DIMER, QUANTITATIVE: D-Dimer, Quant: 2.97 ug/mL-FEU — ABNORMAL HIGH (ref 0.00–0.50)

## 2019-07-14 LAB — HEPARIN LEVEL (UNFRACTIONATED): Heparin Unfractionated: 0.51 IU/mL (ref 0.30–0.70)

## 2019-07-14 LAB — C-REACTIVE PROTEIN: CRP: 11.9 mg/dL — ABNORMAL HIGH (ref <1.0)

## 2019-07-14 LAB — PHOSPHORUS: Phosphorus: 3.9 mg/dL (ref 2.5–4.6)

## 2019-07-14 LAB — MAGNESIUM: Magnesium: 1.9 mg/dL (ref 1.7–2.4)

## 2019-07-14 MED ORDER — CLONIDINE HCL 0.1 MG PO TABS
0.1000 mg | ORAL_TABLET | Freq: Two times a day (BID) | ORAL | Status: DC
  Administered 2019-07-14 – 2019-07-19 (×10): 0.1 mg via ORAL
  Filled 2019-07-14 (×13): qty 1

## 2019-07-14 NOTE — Progress Notes (Addendum)
PROGRESS NOTE    [name]  [id number] DOB: 04-Nov-1950 DOA: 06/25/2019 PCP: Aldine Contes, MD   Brief Narrative:  69 yo BM PMHx diabetes type 2 uncontrolled with complication, diabetic peripheral neuropathy, OSA/OHS, morbid obesity, CKD STAGE III, atrial fibrillation, HTN, atrial fibrillation on Eliquis, chronic pain syndrome, anemia, atrial fibrillation on Eliquis and multiple other medical problems   Presented on 12/15 with 1 week of SOB, difficulty sleeping, poor PO intake, and presyncopal symptoms.  He was admitted from the internal medicine clinic.  He's received steroids and remdesivir.  Plan for plasma today.    Subjective: 1/1 last 24 hours afebrile.     Assessment & Plan:   Principal Problem:   COVID-19 with multiple comorbidities Active Problems:   Diabetes type 2, uncontrolled ([place])   Morbid obesity ([place])   Hypertension   Paroxysmal atrial fibrillation with RVR (HCC)   Respiratory failure with hypoxia (HCC)   Pneumonia due to COVID-19 virus   Acute respiratory failure with hypoxia (HCC)   Chest wall hematoma, left, subsequent encounter   Elevated d-dimer   Atrial fibrillation with RVR ([place])   Diabetes mellitus type 2, uncontrolled, with complications ([place])   Diabetic neuropathy ([place])   Obesity hypoventilation syndrome (HCC)   OSA on CPAP   Morbid obesity with BMI of 70 and over, adult ([place])   Covid pneumonia/acute respiratory failure with hypoxia COVID-19 Labs  Recent Labs    07/12/19 0143 07/13/19 0230 07/14/19 0820  DDIMER 3.71* 3.19* 2.97*  FERRITIN 347* 288 104  CRP 10.8* 10.5* 11.9*    Lab Results  Component Value Date   SARSCOV2NAA POSITIVE (A) 07/03/2019   [place] NEGATIVE 01/24/2019  -Completed course Remdesivir -Complete course Steroids -12/19 transfuse 1 unit Covid convalescent plasma  LEFT Chest Wall Hematoma  -Unable to appreciate on exam secondary to patient's habitus -12/27 repeat CT chest; continues to show  hematoma.  However given patient's rising D-dimer and A. fib will start heparin drip, maintain on low side of therapeutic range. Recent Labs  Lab 07/10/19 0211 07/11/19 0435 07/12/19 0143 07/13/19 0230 07/14/19 0820  HGB 8.2* 8.1* 7.9* 8.3* 7.7*  -Stable  Elevated D-dimer -Elevated D-dimer while on Eliquis, he did miss some days while he was sick.  He was transitioned to full dose lovenox.   -LE [place] was negative for DVT and echo was as follows (normal RV systolic function, moderately elevated PASP, low normal EF - see report).  -See A. fib  A. fib with RVR -Currently NSR -Amlodipine 10 mg daily -12/30 increase Metoprolol 37.5 mg QID -12/27 Heparin drip restarted lowest therapeutic dosing per pharmacy -Would give patient another 48 hours and then restart Eliquis  -0.1 mg BID  Essential HTN -See A. fib  Acute on CKD stage III (baseline Cr 1.5) Recent Labs  Lab 07/10/19 0211 07/11/19 0435 07/12/19 0143 07/13/19 0230 07/14/19 0820  CREATININE 1.85* 1.79* 1.86* 1.58* 1.85*  -Continue to hold ACE I -Strict in and out -16.7 L -Daily weight Filed Weights   06/30/19 1900 07/14/19 0500 07/14/19 0637  Weight: (!) 182 kg (!) 175.5 kg (!) 175.5 kg  -KVO normal saline  Hypomagnesmia -Magnesium level> 2  Elevated LFT -Acute hepatitis panel negative -Resolved  Diabetes type 2 uncontrolled with complication/Diabetic Neuropathy -At home on U500 via pump; Per EMR uses 70 units which is~350 units of U100 insulin -12/27 decrease NovoLog 10 units qac -12/28 decrease Lantus 40 units daily -12/28 decrease moderate SSI  -Tradjenta 5 mg daily  [name] -Lipitor  20 mg daily -12/28 LDL= 35  OSA/OHS -On CPAP at home (on hold here) -See Covid pneumonia   Morbid obesity (BMI 46.37 kg/m) -12/27 nutrition consult morbidly obese, uncontrolled diabetes  Insomnia -trazodone prn qhs  Thrombocytopenia: -Resolved  Glaucoma:  -Continue home eye drops  Pressure injury right  ear Pressure Injury 07/09/19 Ear Right Stage 2 -  Partial thickness loss of dermis presenting as a shallow open injury with a red, pink wound bed without slough. Red (Active)  07/09/19 0640  Location: Ear  Location Orientation: Right  Staging: Stage 2 -  Partial thickness loss of dermis presenting as a shallow open injury with a red, pink wound bed without slough.  Wound Description (Comments): Red  Present on Admission: No   Goals of care -12/29 consult to CIR for placement; patient must be 20 days out from most recent positive Covid test to be accepted. -1/1 there will be no beds available at CIR until Monday.    DVT prophylaxis: Heparin drip Code Status: Full Family Communication: 07/14/2019 spoke with Early (wife) explained plan of care answered all questions Disposition Plan: TBD   Consultants:    Procedures/Significant Events:  12/19 Echo;LVEF=50 to 55%.-negative regional wall abnormality..  -Moderately elevated pulmonary artery systolic pressure. 12/20 bilateral lower extremity Doppler; negative for DVT/SVT 12/24 CT chest Wo contrast;-patchy BILATERAL airspace infiltrates consistent with multifocal pneumonia and COVID-19. -Dffuse edema and enlargement of the LEFT pectoralis muscles especially pectoralis minor with surrounding infiltrative changes extending into the LEFT lateral chest wall. Favor the above changes being related to chest wall hemorrhage/hematoma though unable to exclude infection by CT. -Aortic Atherosclerosis 12/27 CT chest Wo contrast;-interval decrease in size of the left pectoralis minor muscle suggesting resolving edema/hemorrhage. No discrete hematoma at this time. -Persistent enlargement of the left pectoralis major muscle with edema/hemorrhage tracking in the subcutaneous fat of the anterior left chest wall, similar to prior. -Similar appearance of the widespread patchy airspace disease compatible with multifocal pneumonia. -Hepatic steatosis    I  have personally reviewed and interpreted all radiology studies and my findings are as above.  VENTILATOR SETTINGS: HFNC 1/1  flow; 8 L/min SPO2; 99%    Cultures   Antimicrobials: Anti-infectives (From admission, onward)   Start     Dose/Rate Stop   06/28/19 2100  remdesivir 100 mg in sodium chloride 0.9 % 100 mL IVPB     100 mg 200 mL/hr over 30 Minutes 07/01/19 2107   06/22/2019 2100  remdesivir 200 mg in sodium chloride 0.9% 250 mL IVPB     200 mg 580 mL/hr over 30 Minutes 06/28/19 0823       Devices    LINES / TUBES:      Continuous Infusions: . sodium chloride Stopped (07/01/19 0355)  . sodium chloride 50 mL/hr at 07/13/19 1822  . heparin 1,850 Units/hr (07/14/19 1020)     Objective: Vitals:   07/14/19 0400 07/14/19 0500 07/14/19 0637 07/14/19 0717  BP:    (!) 144/69  Pulse:    72  Resp:    16  Temp: 97.7 F (36.5 C)   98.5 F (36.9 C)  TempSrc: Oral   Oral  SpO2:    96%  Weight:  (!) 175.5 kg (!) 175.5 kg   Height:        Intake/Output Summary (Last 24 hours) at 07/14/2019 1147 Last data filed at 07/14/2019 0400 Gross per 24 hour  Intake 785.87 ml  Output 1900 ml  Net -1114.13 ml   Danley Danker  Weights   06/30/19 1900 07/14/19 0500 07/14/19 0637  Weight: (!) 182 kg (!) 175.5 kg (!) 175.5 kg   Physical Exam:  General: A/O x4, positive acute respiratory distress Eyes: negative scleral hemorrhage, negative anisocoria, negative icterus ENT: Negative Runny nose, negative gingival bleeding, Neck:  Negative scars, masses, torticollis, lymphadenopathy, JVD Lungs: Decreased breath sounds bilaterally without wheezes or crackles Cardiovascular: Regular rate and rhythm without murmur gallop or rub normal S1 and S2 Abdomen: MORBIDLY OBESE, negative abdominal pain, nondistended, positive soft, bowel sounds, no rebound, no ascites, no appreciable mass Extremities: No significant cyanosis, clubbing, or edema bilateral lower extremities Skin: Negative rashes,  lesions, ulcers Psychiatric:  Negative depression, negative anxiety, negative fatigue, negative mania  Central nervous system:  Cranial nerves II through XII intact, tongue/uvula midline, all extremities muscle strength 5/5, sensation intact throughout, negative dysarthria, negative expressive aphasia, negative receptive aphasia.      Data Reviewed: Care during the described time interval was provided by me .  I have reviewed this patient's available data, including medical history, events of note, physical examination, and all test results as part of my evaluation.   CBC: Recent Labs  Lab 07/10/19 0211 07/11/19 0435 07/12/19 0143 07/13/19 0230 07/14/19 0820  WBC 8.9 8.2 7.7 7.9 7.2  NEUTROABS 7.4 6.8 6.0 6.1 5.6  HGB 8.2* 8.1* 7.9* 8.3* 7.7*  HCT 26.5* 26.8* 26.0* 26.9* 25.6*  MCV 92.0 92.1 91.9 93.7 94.8  PLT 136* 161 172 161 [id number]   Basic Metabolic Panel: Recent Labs  Lab 07/10/19 0211 07/11/19 0435 07/12/19 0143 07/13/19 0230 07/14/19 0820  NA 138 138 137 137 138  K 4.8 4.5 4.7 4.6 4.8  CL 104 102 105 104 106  CO2 25 24 24 23 24   GLUCOSE 108* 97 111* 123* 103*  BUN 37* 30* 29* 24* 24*  CREATININE 1.85* 1.79* 1.86* 1.58* 1.85*  CALCIUM 8.5* 8.7* 8.6* 8.5* 8.2*  MG 1.8 1.7 1.7 1.6* 1.9  PHOS 3.9 4.1 4.3 4.0 3.9   GFR: Estimated Creatinine Clearance: 66.6 mL/min (A) (by C-G formula based on SCr of 1.85 mg/dL (H)). Liver Function Tests: Recent Labs  Lab 07/10/19 0211 07/11/19 0435 07/12/19 0143 07/13/19 0230 07/14/19 0820  AST 35 32 29 26 25   ALT 31 32 35 32 32  ALKPHOS 68 72 77 78 87  BILITOT 0.8 0.9 0.7 0.8 0.9  PROT 5.4* 5.7* 5.7* 5.8* 5.7*  ALBUMIN 2.0* 2.1* 2.1* 2.1* 2.0*   No results for input(s): LIPASE, AMYLASE in the last 168 hours. No results for input(s): AMMONIA in the last 168 hours. Coagulation Profile: No results for input(s): INR, PROTIME in the last 168 hours. Cardiac Enzymes: No results for input(s): CKTOTAL, CKMB, CKMBINDEX, TROPONINI  in the last 168 hours. BNP (last 3 results) No results for input(s): PROBNP in the last 8760 hours. HbA1C: No results for input(s): HGBA1C in the last 72 hours. CBG: Recent Labs  Lab 07/13/19 1141 07/13/19 1537 07/13/19 1911 07/14/19 0027 07/14/19 0346  GLUCAP 118* 135* 120* 80 91   Lipid Profile: No results for input(s): CHOL, HDL, LDLCALC, TRIG, CHOLHDL, LDLDIRECT in the last 72 hours. Thyroid Function Tests: No results for input(s): TSH, T4TOTAL, FREET4, T3FREE, THYROIDAB in the last 72 hours. Anemia Panel: Recent Labs    07/13/19 0230 07/14/19 0820  FERRITIN 288 104   Urine analysis:    Component Value Date/Time   COLORURINE YELLOW 05/17/2015 1025   APPEARANCEUR CLEAR 05/17/2015 1025   LABSPEC >=1.030 (A) 05/17/2015 1025  PHURINE 5.5 05/17/2015 1025   GLUCOSEU NEGATIVE 05/17/2015 1025   HGBUR MODERATE (A) 05/17/2015 1025   BILIRUBINUR NEGATIVE 05/17/2015 1025   KETONESUR NEGATIVE 05/17/2015 1025   PROTEINUR NEGATIVE 12/13/2014 2120   UROBILINOGEN 0.2 05/17/2015 1025   NITRITE NEGATIVE 05/17/2015 1025   LEUKOCYTESUR NEGATIVE 05/17/2015 1025   Sepsis Labs: @LABRCNTIP (procalcitonin:4,lacticidven:4)  )No results found for this or any previous visit (from the past 240 hour(s)).       Radiology Studies: No results found.      Scheduled Meds: . amLODipine  10 mg Oral Daily  . atorvastatin  20 mg Oral q1800  . Chlorhexidine Gluconate Cloth  6 each Topical Daily  . cycloSPORINE  1 drop Both Eyes Daily  . gabapentin  300 mg Oral QHS  . insulin aspart  0-15 Units Subcutaneous Q4H  . insulin aspart  10 Units Subcutaneous TID WC  . insulin glargine  40 Units Subcutaneous Daily  . latanoprost  1 drop Both Eyes QHS  . linagliptin  5 mg Oral Daily  . mouth rinse  15 mL Mouth Rinse BID  . metoprolol tartrate  37.5 mg Oral Q6H  . pantoprazole  40 mg Oral Daily  . sodium chloride flush  3 mL Intravenous Q12H   Continuous Infusions: . sodium chloride  Stopped (07/01/19 0355)  . sodium chloride 50 mL/hr at 07/13/19 1822  . heparin 1,850 Units/hr (07/14/19 1020)     LOS: 17 days   The patient is critically ill with multiple organ systems failure and requires high complexity decision making for assessment and support, frequent evaluation and titration of therapies, application of advanced monitoring technologies and extensive interpretation of multiple databases. Critical Care Time devoted to patient care services described in this note  Time spent: 40 minutes     Flecia Shutter, Geraldo Docker, MD [facility] Pager [phone]  If 7PM-7AM, please contact night-coverage [email or web address] Password [facility] 07/14/2019, 11:47 AM

## 2019-07-14 NOTE — Progress Notes (Signed)
ANTICOAGULATION CONSULT NOTE - Follow Up Consult  Pharmacy Consult for Heparin Indication: Afib (Eliquis PTA, r/o PE with increased d-dimer)  Allergies  Allergen Reactions  . Vancomycin     REACTION: ARF    Patient Measurements: Height: 6\' 6"  (198.1 cm) Weight: (!) 387 lb (175.5 kg) IBW/kg (Calculated) : 91.4 Heparin Dosing Weight:   Vital Signs: Temp: 98.5 F (36.9 C) (01/01 0717) Temp Source: Oral (01/01 0717) BP: 144/69 (01/01 0717) Pulse Rate: 72 (01/01 0717)  Labs: Recent Labs    07/12/19 0143 07/13/19 0230 07/13/19 1403 07/13/19 2115 07/14/19 0820  HGB 7.9* 8.3*  --   --  7.7*  HCT 26.0* 26.9*  --   --  25.6*  PLT 172 161  --   --  173  HEPARINUNFRC 0.27* 0.28* 0.41 0.49 0.51  CREATININE 1.86* 1.58*  --   --  1.85*    Estimated Creatinine Clearance: 66.6 mL/min (A) (by C-G formula based on SCr of 1.85 mg/dL (H)).   Assessment: 69 y.o. M on Eliquis 5mg  BID for PAF. Pt reported to MD about missing several Eliquis doses PTA. Changed to full-dose Lovenox 180mg  SQ q12h for r/o PE. Dopplers negative. Anti-Xa remains > 2 despite holding Lovenox.   Anticoagulation was on hold for several days due to chest wall hemotoma. However, due to concern with increasing d-dimer and a hx of afib, heparin was ordered to be resumed with a lower goal. We will do it without bolus. He is morbidly obese.   HL 0.51, no bleeding per RN, CBC stable   Goal of Therapy:  Heparin level 0.3 - 0.5 units/ml Monitor platelets by anticoagulation protocol: Yes   Plan:   Continue heparin rate at 1850 units/hr   Daily heparin level  Monitor for s/sx of bleeding  Thank you for allowing pharmacy to participate in this patient's care. Ulice Dash, PharmD, BCPS   07/14/2019 10:52 AM

## 2019-07-14 NOTE — Progress Notes (Signed)
Spoke with the patient's son, Kennith [place], and answered all questions about the patient's condition at this time.

## 2019-07-14 NOTE — Progress Notes (Signed)
Pt stood to bedside for standing weight, while on 8L HFNC pt desated to 77% and took approx. 90 seconds to reach 86% after sitting back down to bedside.

## 2019-07-14 DEATH — deceased

## 2019-07-15 LAB — COMPREHENSIVE METABOLIC PANEL
ALT: 27 U/L (ref 0–44)
AST: 23 U/L (ref 15–41)
Albumin: 2.1 g/dL — ABNORMAL LOW (ref 3.5–5.0)
Alkaline Phosphatase: 99 U/L (ref 38–126)
Anion gap: 8 (ref 5–15)
BUN: 24 mg/dL — ABNORMAL HIGH (ref 8–23)
CO2: 24 mmol/L (ref 22–32)
Calcium: 8.1 mg/dL — ABNORMAL LOW (ref 8.9–10.3)
Chloride: 107 mmol/L (ref 98–111)
Creatinine, Ser: 1.77 mg/dL — ABNORMAL HIGH (ref 0.61–1.24)
GFR calc Af Amer: 44 mL/min — ABNORMAL LOW (ref >60)
GFR calc non Af Amer: 38 mL/min — ABNORMAL LOW (ref >60)
Glucose, Bld: 105 mg/dL — ABNORMAL HIGH (ref 70–99)
Potassium: 4.5 mmol/L (ref 3.5–5.1)
Sodium: 139 mmol/L (ref 135–145)
Total Bilirubin: 0.8 mg/dL (ref 0.3–1.2)
Total Protein: 5.8 g/dL — ABNORMAL LOW (ref 6.5–8.1)

## 2019-07-15 LAB — CBC WITH DIFFERENTIAL/PLATELET
Abs Immature Granulocytes: 0.07 10*3/uL (ref 0.00–0.07)
Basophils Absolute: 0 10*3/uL (ref 0.0–0.1)
Basophils Relative: 0 %
Eosinophils Absolute: 0.2 10*3/uL (ref 0.0–0.5)
Eosinophils Relative: 3 %
HCT: 26.3 % — ABNORMAL LOW (ref 39.0–52.0)
Hemoglobin: 7.9 g/dL — ABNORMAL LOW (ref 13.0–17.0)
Immature Granulocytes: 1 %
Lymphocytes Relative: 11 %
Lymphs Abs: 0.8 10*3/uL (ref 0.7–4.0)
MCH: 28.5 pg (ref 26.0–34.0)
MCHC: 30 g/dL (ref 30.0–36.0)
MCV: 94.9 fL (ref 80.0–100.0)
Monocytes Absolute: 0.6 10*3/uL (ref 0.1–1.0)
Monocytes Relative: 7 %
Neutro Abs: 5.8 10*3/uL (ref 1.7–7.7)
Neutrophils Relative %: 78 %
Platelets: 168 10*3/uL (ref 150–400)
RBC: 2.77 MIL/uL — ABNORMAL LOW (ref 4.22–5.81)
RDW: 15.8 % — ABNORMAL HIGH (ref 11.5–15.5)
WBC: 7.4 10*3/uL (ref 4.0–10.5)
nRBC: 0.3 % — ABNORMAL HIGH (ref 0.0–0.2)

## 2019-07-15 LAB — CBC
HCT: 25.3 % — ABNORMAL LOW (ref 39.0–52.0)
Hemoglobin: 7.5 g/dL — ABNORMAL LOW (ref 13.0–17.0)
MCH: 28.2 pg (ref 26.0–34.0)
MCHC: 29.6 g/dL — ABNORMAL LOW (ref 30.0–36.0)
MCV: 95.1 fL (ref 80.0–100.0)
Platelets: 171 10*3/uL (ref 150–400)
RBC: 2.66 MIL/uL — ABNORMAL LOW (ref 4.22–5.81)
RDW: 15.9 % — ABNORMAL HIGH (ref 11.5–15.5)
WBC: 7.1 10*3/uL (ref 4.0–10.5)
nRBC: 0.4 % — ABNORMAL HIGH (ref 0.0–0.2)

## 2019-07-15 LAB — GLUCOSE, CAPILLARY
Glucose-Capillary: 102 mg/dL — ABNORMAL HIGH (ref 70–99)
Glucose-Capillary: 107 mg/dL — ABNORMAL HIGH (ref 70–99)
Glucose-Capillary: 138 mg/dL — ABNORMAL HIGH (ref 70–99)
Glucose-Capillary: 171 mg/dL — ABNORMAL HIGH (ref 70–99)
Glucose-Capillary: 55 mg/dL — ABNORMAL LOW (ref 70–99)
Glucose-Capillary: 93 mg/dL (ref 70–99)

## 2019-07-15 LAB — PHOSPHORUS
Phosphorus: 3.2 mg/dL (ref 2.5–4.6)
Phosphorus: 3 mg/dL (ref 2.5–4.6)

## 2019-07-15 LAB — MAGNESIUM
Magnesium: 1.9 mg/dL (ref 1.7–2.4)
Magnesium: 1.8 mg/dL (ref 1.7–2.4)

## 2019-07-15 LAB — FERRITIN
Ferritin: 264 ng/mL (ref 24–336)
Ferritin: 248 ng/mL (ref 24–336)

## 2019-07-15 LAB — HEPARIN LEVEL (UNFRACTIONATED): Heparin Unfractionated: 0.37 IU/mL (ref 0.30–0.70)

## 2019-07-15 LAB — C-REACTIVE PROTEIN
CRP: 11.7 mg/dL — ABNORMAL HIGH (ref <1.0)
CRP: 12.1 mg/dL — ABNORMAL HIGH (ref <1.0)

## 2019-07-15 LAB — D-DIMER, QUANTITATIVE: D-Dimer, Quant: 2.44 ug/mL-FEU — ABNORMAL HIGH (ref 0.00–0.50)

## 2019-07-15 LAB — HEMOGLOBIN AND HEMATOCRIT, BLOOD
HCT: 23.6 % — ABNORMAL LOW (ref 39.0–52.0)
Hemoglobin: 7 g/dL — ABNORMAL LOW (ref 13.0–17.0)

## 2019-07-15 NOTE — Progress Notes (Signed)
Pt A&Ox4 BG as ordered s/s and standing coverage, Heparin 18.5units/hr. Poor po intake,  1845 loose stool frank blood 1060ml noted, Heparin stopped per nursing judgement, texted Dr. Shanon Brow on-call response pending and spoke with pharmacist regarding dosing. Passed situation on in report to night RN who will resume responsibility of patient, awaiting call from provider, pt remains alert and oriented, denies pain, did c/o nausea prior to bowel movement. Will cont to monitor.

## 2019-07-15 NOTE — Progress Notes (Signed)
ANTICOAGULATION CONSULT NOTE - Follow Up Consult  Pharmacy Consult for Heparin Indication: Afib (Eliquis PTA, r/o PE with increased d-dimer)  Allergies  Allergen Reactions  . Vancomycin     REACTION: ARF    Patient Measurements: Height: 6\' 6"  (198.1 cm) Weight: (!) 386 lb 14.5 oz (175.5 kg) IBW/kg (Calculated) : 91.4 Heparin Dosing Weight:   Vital Signs: Temp: 97.6 F (36.4 C) (01/02 0843) Temp Source: Oral (01/02 0843) BP: 141/72 (01/02 0400) Pulse Rate: 75 (01/02 0400)  Labs: Recent Labs    07/13/19 0230 07/13/19 2115 07/14/19 0820 07/15/19 0711  HGB 8.3*  --  7.7* 7.5*  HCT 26.9*  --  25.6* 25.3*  PLT 161  --  173 171  HEPARINUNFRC 0.28* 0.49 0.51 0.37  CREATININE 1.58*  --  1.85*  --     Estimated Creatinine Clearance: 66.6 mL/min (A) (by C-G formula based on SCr of 1.85 mg/dL (H)).   Assessment: 69 y.o. M on Eliquis 5mg  BID for PAF. Pt reported to MD about missing several Eliquis doses PTA. Changed to full-dose Lovenox 180mg  SQ q12h for r/o PE. Dopplers negative. Anti-Xa remains > 2 despite holding Lovenox.   Anticoagulation was on hold for several days due to chest wall hemotoma. However, due to concern with increasing d-dimer and a hx of afib, heparin was ordered to be resumed with a lower goal. We will do it without bolus. He is morbidly obese.   HL 0.37, no bleeding per RN, CBC stable   Goal of Therapy:  Heparin level 0.3 - 0.5 units/ml Monitor platelets by anticoagulation protocol: Yes   Plan:   Continue heparin rate at 1850 units/hr   Daily heparin level  Monitor for s/sx of bleeding  Thank you for allowing pharmacy to participate in this patient's care. Anette Guarneri, PharmD   07/15/2019 9:51 AM

## 2019-07-15 NOTE — Progress Notes (Signed)
PROGRESS NOTE    [name]  [id number] DOB: 1951-06-07 DOA: 06/14/2019 PCP: Aldine Contes, MD   Brief Narrative:  69 yo BM PMHx diabetes type 2 uncontrolled with complication, diabetic peripheral neuropathy, OSA/OHS, morbid obesity, CKD STAGE III, atrial fibrillation, HTN, atrial fibrillation on Eliquis, chronic pain syndrome, anemia, atrial fibrillation on Eliquis and multiple other medical problems   Presented on 12/15 with 1 week of SOB, difficulty sleeping, poor PO intake, and presyncopal symptoms.  He was admitted from the internal medicine clinic.  He's received steroids and remdesivir.  Plan for plasma today.    Subjective: 1/2   afebrile last 24 hours    Assessment & Plan:   Principal Problem:   COVID-19 with multiple comorbidities Active Problems:   Diabetes type 2, uncontrolled ([place])   Morbid obesity ([place])   Hypertension   Paroxysmal atrial fibrillation with RVR (HCC)   Respiratory failure with hypoxia (HCC)   Pneumonia due to COVID-19 virus   Acute respiratory failure with hypoxia (HCC)   Chest wall hematoma, left, subsequent encounter   Elevated d-dimer   Atrial fibrillation with RVR ([place])   Diabetes mellitus type 2, uncontrolled, with complications ([place])   Diabetic neuropathy ([place])   Obesity hypoventilation syndrome (HCC)   OSA on CPAP   Morbid obesity with BMI of 70 and over, adult ([place])   Covid pneumonia/acute respiratory failure with hypoxia COVID-19 Labs  Recent Labs    07/13/19 0230 07/14/19 0820 07/15/19 0711  DDIMER 3.19* 2.97*  --   FERRITIN 288 104 264  CRP 10.5* 11.9* 11.7*    Lab Results  Component Value Date   SARSCOV2NAA POSITIVE (A) 07/13/2019   [place] NEGATIVE 01/24/2019  -Completed course Remdesivir -Complete course Steroids -12/19 transfuse 1 unit Covid convalescent plasma  LEFT Chest Wall Hematoma  -Unable to appreciate on exam secondary to patient's habitus -12/27 repeat CT chest; continues to show  hematoma.  However given patient's rising D-dimer and A. fib will start heparin drip, maintain on low side of therapeutic range. Recent Labs  Lab 07/11/19 0435 07/12/19 0143 07/13/19 0230 07/14/19 0820 07/15/19 0711  HGB 8.1* 7.9* 8.3* 7.7* 7.5*  -Stable  Elevated D-dimer -Elevated D-dimer while on Eliquis, he did miss some days while he was sick.  He was transitioned to full dose lovenox.   -LE [place] was negative for DVT and echo was as follows (normal RV systolic function, moderately elevated PASP, low normal EF - see report).  -See A. fib  A. fib with RVR -Currently NSR -Amlodipine 10 mg daily -12/30 increase Metoprolol 37.5 mg QID -12/27 Heparin drip restarted lowest therapeutic dosing per pharmacy -Would give patient another 48 hours and then restart Eliquis  -0.1 mg BID  Essential HTN -See A. fib  Acute on CKD stage III (baseline Cr 1.5) Recent Labs  Lab 07/10/19 0211 07/11/19 0435 07/12/19 0143 07/13/19 0230 07/14/19 0820  CREATININE 1.85* 1.79* 1.86* 1.58* 1.85*  -Continue to hold ACE I -Strict in and out -16.7 L -Daily weight Filed Weights   07/14/19 0500 07/14/19 0637 07/15/19 0352  Weight: (!) 175.5 kg (!) 175.5 kg (!) 175.5 kg  -KVO normal saline  Hypomagnesmia -Magnesium level> 2  Elevated LFT -Acute hepatitis panel negative -Resolved  Diabetes type 2 uncontrolled with complication/Diabetic Neuropathy -At home on U500 via pump; Per EMR uses 70 units which is~350 units of U100 insulin -12/27 decrease NovoLog 10 units qac -12/28 decrease Lantus 40 units daily -12/28 decrease moderate SSI  -Tradjenta 5 mg daily  [name] -Lipitor 20 mg daily -12/28 LDL= 35  OSA/OHS -On CPAP at home (on hold here) -See Covid pneumonia   Morbid obesity (BMI 46.37 kg/m) -12/27 nutrition consult morbidly obese, uncontrolled diabetes  Insomnia -trazodone prn qhs  Thrombocytopenia: -Resolved  Glaucoma:  -Continue home eye drops  Pressure injury right  ear Pressure Injury 07/09/19 Ear Right Stage 2 -  Partial thickness loss of dermis presenting as a shallow open injury with a red, pink wound bed without slough. Red (Active)  07/09/19 0640  Location: Ear  Location Orientation: Right  Staging: Stage 2 -  Partial thickness loss of dermis presenting as a shallow open injury with a red, pink wound bed without slough.  Wound Description (Comments): Red  Present on Admission: No   Goals of care -12/29 consult to CIR for placement; patient must be 20 days out from most recent positive Covid test to be accepted.  (January 4 should be eligible) -1/1 there will be no beds available at [facility] until Monday.    DVT prophylaxis: Heparin drip Code Status: Full Family Communication: 07/14/2019 spoke with Early (wife) explained plan of care answered all questions Disposition Plan: TBD   Consultants:  CIR    Procedures/Significant Events:  12/19 Echo;LVEF=50 to 55%.-negative regional wall abnormality..  -Moderately elevated pulmonary artery systolic pressure. 12/20 bilateral lower extremity Doppler; negative for DVT/SVT 12/24 CT chest Wo contrast;-patchy BILATERAL airspace infiltrates consistent with multifocal pneumonia and COVID-19. -Dffuse edema and enlargement of the LEFT pectoralis muscles especially pectoralis minor with surrounding infiltrative changes extending into the LEFT lateral chest wall. Favor the above changes being related to chest wall hemorrhage/hematoma though unable to exclude infection by CT. -Aortic Atherosclerosis 12/27 CT chest Wo contrast;-interval decrease in size of the left pectoralis minor muscle suggesting resolving edema/hemorrhage. No discrete hematoma at this time. -Persistent enlargement of the left pectoralis major muscle with edema/hemorrhage tracking in the subcutaneous fat of the anterior left chest wall, similar to prior. -Similar appearance of the widespread patchy airspace disease compatible with multifocal  pneumonia. -Hepatic steatosis    I have personally reviewed and interpreted all radiology studies and my findings are as above.  VENTILATOR SETTINGS: HFNC 1/1  flow; 8 L/min SPO2; 99%    Cultures 12/15 SARS coronavirus positive 12/15 HIV screen negative 12/20 acute hepatitis panel negative    Antimicrobials: Anti-infectives (From admission, onward)   Start     Dose/Rate Stop   06/28/19 2100  remdesivir 100 mg in sodium chloride 0.9 % 100 mL IVPB     100 mg 200 mL/hr over 30 Minutes 07/01/19 2107   06/22/2019 2100  remdesivir 200 mg in sodium chloride 0.9% 250 mL IVPB     200 mg 580 mL/hr over 30 Minutes 06/28/19 0823       Devices    LINES / TUBES:      Continuous Infusions: . sodium chloride Stopped (07/01/19 0355)  . sodium chloride 10 mL/hr at 07/14/19 1157  . heparin 1,850 Units/hr (07/15/19 0506)     Objective: Vitals:   07/15/19 0352 07/15/19 0400 07/15/19 0820 07/15/19 0843  BP:  (!) 141/72    Pulse:  75    Resp:  14    Temp:  97.9 F (36.6 C) (!) 97.4 F (36.3 C) 97.6 F (36.4 C)  TempSrc:  Oral Oral Oral  SpO2:  100% 95%   Weight: (!) 175.5 kg     Height:        Intake/Output Summary (Last 24 hours) at 07/15/2019  [place] filed at 07/15/2019 0834 Gross per 24 hour  Intake 240 ml  Output 1000 ml  Net -760 ml   Filed Weights   07/14/19 0500 07/14/19 [id number] 07/15/19 0352  Weight: (!) 175.5 kg (!) 175.5 kg (!) 175.5 kg   Physical Exam:  General: A/O x4, positive  acute respiratory distress Eyes: negative scleral hemorrhage, negative anisocoria, negative icterus ENT: Negative Runny nose, negative gingival bleeding, Neck:  Negative scars, masses, torticollis, lymphadenopathy, JVD Lungs: Clear to auscultation bilaterally without wheezes or crackles Cardiovascular: Regular rate and rhythm without murmur gallop or rub normal S1 and S2 Abdomen: MORBIDLY OBESE negative abdominal pain, nondistended, positive soft, bowel sounds, no  rebound, no ascites, no appreciable mass Extremities: No significant cyanosis, clubbing, or edema bilateral lower extremities Skin: Negative rashes, lesions, ulcers Psychiatric:  Negative depression, negative anxiety, negative fatigue, negative mania  Central nervous system:  Cranial nerves II through XII intact, tongue/uvula midline, all extremities muscle strength 5/5, sensation intact throughout, negative dysarthria, negative expressive aphasia, negative receptive aphasia.       Data Reviewed: Care during the described time interval was provided by me .  I have reviewed this patient's available data, including medical history, events of note, physical examination, and all test results as part of my evaluation.   CBC: Recent Labs  Lab 07/10/19 0211 07/11/19 0435 07/12/19 0143 07/13/19 0230 07/14/19 0820 07/15/19 0711  WBC 8.9 8.2 7.7 7.9 7.2 7.1  NEUTROABS 7.4 6.8 6.0 6.1 5.6  --   HGB 8.2* 8.1* 7.9* 8.3* 7.7* 7.5*  HCT 26.5* 26.8* 26.0* 26.9* 25.6* 25.3*  MCV 92.0 92.1 91.9 93.7 94.8 95.1  PLT 136* 161 172 161 173 [id number]   Basic Metabolic Panel: Recent Labs  Lab 07/10/19 0211 07/11/19 0435 07/12/19 0143 07/13/19 0230 07/14/19 0820 07/15/19 0711  NA 138 138 137 137 138  --   K 4.8 4.5 4.7 4.6 4.8  --   CL 104 102 105 104 106  --   CO2 25 24 24 23 24   --   GLUCOSE 108* 97 111* 123* 103*  --   BUN 37* 30* 29* 24* 24*  --   CREATININE 1.85* 1.79* 1.86* 1.58* 1.85*  --   CALCIUM 8.5* 8.7* 8.6* 8.5* 8.2*  --   MG 1.8 1.7 1.7 1.6* 1.9 1.9  PHOS 3.9 4.1 4.3 4.0 3.9 3.2   GFR: Estimated Creatinine Clearance: 66.6 mL/min (A) (by C-G formula based on SCr of 1.85 mg/dL (H)). Liver Function Tests: Recent Labs  Lab 07/10/19 0211 07/11/19 0435 07/12/19 0143 07/13/19 0230 07/14/19 0820  AST 35 32 29 26 25   ALT 31 32 35 32 32  ALKPHOS 68 72 77 78 87  BILITOT 0.8 0.9 0.7 0.8 0.9  PROT 5.4* 5.7* 5.7* 5.8* 5.7*  ALBUMIN 2.0* 2.1* 2.1* 2.1* 2.0*   No results for input(s):  LIPASE, AMYLASE in the last 168 hours. No results for input(s): AMMONIA in the last 168 hours. Coagulation Profile: No results for input(s): INR, PROTIME in the last 168 hours. Cardiac Enzymes: No results for input(s): CKTOTAL, CKMB, CKMBINDEX, TROPONINI in the last 168 hours. BNP (last 3 results) No results for input(s): PROBNP in the last 8760 hours. HbA1C: No results for input(s): HGBA1C in the last 72 hours. CBG: Recent Labs  Lab 07/14/19 1613 07/14/19 1944 07/15/19 0016 07/15/19 0331 07/15/19 0821  GLUCAP 124* 106* 138* 107* 102*   Lipid Profile: No results for input(s): CHOL, HDL, LDLCALC, TRIG, CHOLHDL, LDLDIRECT in  the last 72 hours. Thyroid Function Tests: No results for input(s): TSH, T4TOTAL, FREET4, T3FREE, THYROIDAB in the last 72 hours. Anemia Panel: Recent Labs    07/14/19 0820 07/15/19 0711  FERRITIN 104 264   Urine analysis:    Component Value Date/Time   COLORURINE YELLOW 05/17/2015 1025   APPEARANCEUR CLEAR 05/17/2015 1025   LABSPEC >=1.030 (A) 05/17/2015 1025   PHURINE 5.5 05/17/2015 1025   GLUCOSEU NEGATIVE 05/17/2015 1025   HGBUR MODERATE (A) 05/17/2015 1025   BILIRUBINUR NEGATIVE 05/17/2015 1025   KETONESUR NEGATIVE 05/17/2015 1025   PROTEINUR NEGATIVE 12/13/2014 2120   UROBILINOGEN 0.2 05/17/2015 1025   NITRITE NEGATIVE 05/17/2015 1025   LEUKOCYTESUR NEGATIVE 05/17/2015 1025   Sepsis Labs: @LABRCNTIP (procalcitonin:4,lacticidven:4)  )No results found for this or any previous visit (from the past 240 hour(s)).       Radiology Studies: No results found.      Scheduled Meds: . amLODipine  10 mg Oral Daily  . atorvastatin  20 mg Oral q1800  . Chlorhexidine Gluconate Cloth  6 each Topical Daily  . cloNIDine  0.1 mg Oral BID  . cycloSPORINE  1 drop Both Eyes Daily  . gabapentin  300 mg Oral QHS  . insulin aspart  0-15 Units Subcutaneous Q4H  . insulin aspart  10 Units Subcutaneous TID WC  . insulin glargine  40 Units  Subcutaneous Daily  . latanoprost  1 drop Both Eyes QHS  . linagliptin  5 mg Oral Daily  . mouth rinse  15 mL Mouth Rinse BID  . metoprolol tartrate  37.5 mg Oral Q6H  . pantoprazole  40 mg Oral Daily  . sodium chloride flush  3 mL Intravenous Q12H   Continuous Infusions: . sodium chloride Stopped (07/01/19 0355)  . sodium chloride 10 mL/hr at 07/14/19 1157  . heparin 1,850 Units/hr (07/15/19 0506)     LOS: 18 days   The patient is critically ill with multiple organ systems failure and requires high complexity decision making for assessment and support, frequent evaluation and titration of therapies, application of advanced monitoring technologies and extensive interpretation of multiple databases. Critical Care Time devoted to patient care services described in this note  Time spent: 40 minutes     Daleiza Bacchi, Geraldo Docker, MD [facility] Pager [phone]  If 7PM-7AM, please contact night-coverage [email or web address] Password [email or web address] 07/15/2019, 10:02 AM

## 2019-07-15 NOTE — Progress Notes (Signed)
Report given to shift RN, patient continues on heparin gtt at 18.5 mls hour.

## 2019-07-16 DIAGNOSIS — L8990 Pressure ulcer of unspecified site, unspecified stage: Secondary | ICD-10-CM | POA: Diagnosis present

## 2019-07-16 DIAGNOSIS — D62 Acute posthemorrhagic anemia: Secondary | ICD-10-CM | POA: Diagnosis not present

## 2019-07-16 LAB — COMPREHENSIVE METABOLIC PANEL
ALT: 26 U/L (ref 0–44)
AST: 21 U/L (ref 15–41)
Albumin: 2.1 g/dL — ABNORMAL LOW (ref 3.5–5.0)
Alkaline Phosphatase: 102 U/L (ref 38–126)
Anion gap: 7 (ref 5–15)
BUN: 35 mg/dL — ABNORMAL HIGH (ref 8–23)
CO2: 22 mmol/L (ref 22–32)
Calcium: 8.1 mg/dL — ABNORMAL LOW (ref 8.9–10.3)
Chloride: 109 mmol/L (ref 98–111)
Creatinine, Ser: 1.8 mg/dL — ABNORMAL HIGH (ref 0.61–1.24)
GFR calc Af Amer: 44 mL/min — ABNORMAL LOW (ref >60)
GFR calc non Af Amer: 38 mL/min — ABNORMAL LOW (ref >60)
Glucose, Bld: 140 mg/dL — ABNORMAL HIGH (ref 70–99)
Potassium: 5.1 mmol/L (ref 3.5–5.1)
Sodium: 138 mmol/L (ref 135–145)
Total Bilirubin: 1 mg/dL (ref 0.3–1.2)
Total Protein: 5.4 g/dL — ABNORMAL LOW (ref 6.5–8.1)

## 2019-07-16 LAB — GLUCOSE, CAPILLARY
Glucose-Capillary: 106 mg/dL — ABNORMAL HIGH (ref 70–99)
Glucose-Capillary: 117 mg/dL — ABNORMAL HIGH (ref 70–99)
Glucose-Capillary: 120 mg/dL — ABNORMAL HIGH (ref 70–99)
Glucose-Capillary: 125 mg/dL — ABNORMAL HIGH (ref 70–99)
Glucose-Capillary: 136 mg/dL — ABNORMAL HIGH (ref 70–99)
Glucose-Capillary: 150 mg/dL — ABNORMAL HIGH (ref 70–99)
Glucose-Capillary: 76 mg/dL (ref 70–99)

## 2019-07-16 LAB — HEMOGLOBIN AND HEMATOCRIT, BLOOD
HCT: 24.7 % — ABNORMAL LOW (ref 39.0–52.0)
Hemoglobin: 7.7 g/dL — ABNORMAL LOW (ref 13.0–17.0)

## 2019-07-16 LAB — CBC WITH DIFFERENTIAL/PLATELET
Abs Immature Granulocytes: 0.07 10*3/uL (ref 0.00–0.07)
Basophils Absolute: 0 10*3/uL (ref 0.0–0.1)
Basophils Relative: 0 %
Eosinophils Absolute: 0.1 10*3/uL (ref 0.0–0.5)
Eosinophils Relative: 2 %
HCT: 22 % — ABNORMAL LOW (ref 39.0–52.0)
Hemoglobin: 6.6 g/dL — CL (ref 13.0–17.0)
Immature Granulocytes: 1 %
Lymphocytes Relative: 12 %
Lymphs Abs: 1 10*3/uL (ref 0.7–4.0)
MCH: 28.4 pg (ref 26.0–34.0)
MCHC: 30 g/dL (ref 30.0–36.0)
MCV: 94.8 fL (ref 80.0–100.0)
Monocytes Absolute: 0.6 10*3/uL (ref 0.1–1.0)
Monocytes Relative: 7 %
Neutro Abs: 6.4 10*3/uL (ref 1.7–7.7)
Neutrophils Relative %: 78 %
Platelets: 163 10*3/uL (ref 150–400)
RBC: 2.32 MIL/uL — ABNORMAL LOW (ref 4.22–5.81)
RDW: 16 % — ABNORMAL HIGH (ref 11.5–15.5)
WBC: 8.2 10*3/uL (ref 4.0–10.5)
nRBC: 0.2 % (ref 0.0–0.2)

## 2019-07-16 LAB — PROTIME-INR
INR: 1.1 (ref 0.8–1.2)
Prothrombin Time: 14 seconds (ref 11.4–15.2)

## 2019-07-16 LAB — CBC
HCT: 24 % — ABNORMAL LOW (ref 39.0–52.0)
Hemoglobin: 7.2 g/dL — ABNORMAL LOW (ref 13.0–17.0)
MCH: 27.7 pg (ref 26.0–34.0)
MCHC: 30 g/dL (ref 30.0–36.0)
MCV: 92.3 fL (ref 80.0–100.0)
Platelets: 161 10*3/uL (ref 150–400)
RBC: 2.6 MIL/uL — ABNORMAL LOW (ref 4.22–5.81)
RDW: 16.5 % — ABNORMAL HIGH (ref 11.5–15.5)
WBC: 8.2 10*3/uL (ref 4.0–10.5)
nRBC: 0.5 % — ABNORMAL HIGH (ref 0.0–0.2)

## 2019-07-16 LAB — D-DIMER, QUANTITATIVE: D-Dimer, Quant: 2.2 ug/mL-FEU — ABNORMAL HIGH (ref 0.00–0.50)

## 2019-07-16 LAB — FERRITIN: Ferritin: 239 ng/mL (ref 24–336)

## 2019-07-16 LAB — MAGNESIUM: Magnesium: 1.9 mg/dL (ref 1.7–2.4)

## 2019-07-16 LAB — FIBRINOGEN: Fibrinogen: 723 mg/dL — ABNORMAL HIGH (ref 210–475)

## 2019-07-16 LAB — APTT: aPTT: 41 seconds — ABNORMAL HIGH (ref 24–36)

## 2019-07-16 LAB — PHOSPHORUS: Phosphorus: 3.6 mg/dL (ref 2.5–4.6)

## 2019-07-16 LAB — PREPARE RBC (CROSSMATCH)

## 2019-07-16 LAB — C-REACTIVE PROTEIN: CRP: 14.1 mg/dL — ABNORMAL HIGH (ref <1.0)

## 2019-07-16 MED ORDER — SODIUM CHLORIDE 0.9% IV SOLUTION: Freq: Once | INTRAVENOUS | Status: AC

## 2019-07-16 NOTE — Progress Notes (Signed)
MD notified of pt lower than normal for patient B/P of 105/61 r/t GI bleed earlier at shift change.  New order for type and screen given, will also hold 0200 metoprolol per MD.

## 2019-07-16 NOTE — Progress Notes (Signed)
PROGRESS NOTE  [name] [id number] DOB: 1950/08/01 DOA: 07/04/2019 PCP: Aldine Contes, MD  HPI/Recap of past 30 hours: 69 year old male with past medical history of uncontrolled diabetes type 2 with stage III chronic kidney disease and peripheral neuropathy, obstructive sleep apnea, morbid obesity and atrial fibrillation on Eliquis admitted on 12/15 with 1 week of shortness of breath and found to have Covid along with respiratory failure with hypoxia.  Patient was initially admitted to the ICU on heated high flow.  Patient has since completed a full course of remdesivir and steroids.  On 12/19, he received 1 unit of convalescent plasma.  He is overall continue to improve requiring less oxygen, currently at 3 L.  His hospital course has been complicated by left chest wall hematoma which led to anticoagulation being put on hold.  Patient started having rising D-dimer and with his ongoing A. fib, he was initially transitioned from his Eliquis to full dose Lovenox.  With anticoagulation put on hold, he was started back on heparin drip, but maintained on the low side of the therapeutic range.  On the night of 1/2, patient noted to have some loose stool with frank blood.  Heparin was stopped.  His blood levels had been slowly declining, but had remained relatively stable for approximately 2 days around 7.5, but on night of 1/2, had dropped down to 6.6.  Patient ordered 2 units packed red blood cells.  Blood pressure since has been stable.  This morning, patient with no complaints, denies any abdominal pain.  No further bleeding episodes.  Assessment/Plan: Principal Problem:   COVID-19 pneumonia with multiple comorbidities leading to acute respiratory failure with hypoxia: Slowly improving.  Weaning down oxygen. Active Problems:   Diabetes type 2, uncontrolled ([place]) with stage III chronic kidney disease and peripheral neuropathy: CBGs have been better stabilized    Morbid obesity  ([place]): Meets criteria with BMI greater than 40  Acute kidney injury in the setting of stage III chronic kidney disease: Suspect that this is likely secondary to bleeding.  Should improve with blood transfusion.  Baseline creatinine around 1.5.    Hypertension: Blood pressure on the softer side, likely due to bleeding.    Paroxysmal atrial fibrillation with RVR ([place]): Rate controlled.  Has been on metoprolol, which was held today due to low blood pressures from bleeding.  Anticoagulation on hold.  See above.    Chest wall hematoma, left, subsequent encounter:    Elevated d-dimer    Obesity hypoventilation syndrome (HCC)/OSA: Continue nightly CPAP    Pressure injury of skin: Present on admission  Acute blood loss anemia: Have currently stopped anticoagulation.  Transfusing several units packed red blood cells.  Glaucoma: Continue eyedrops  Code Status: Full code  Family Communication: Updated son by phone  Disposition Plan: Has been accepted for inpatient rehab, pending insurance approval plus he will be 3 weeks out of his Covid positive test on 1/4.  Need to further stabilize his bleeding   Consultants:  Physical medicine rehab  Critical care  Procedures:  Transfused 2 units packed red blood cells 1/3  Echocardiogram done 12/19: Preserved ejection fraction, no significant valvular dysfunction  Lower extremity Dopplers done 12/20: Negative for DVT  Antimicrobials:  IV Remdisivir 12/15-12/19  DVT prophylaxis: SCDs currently.  Anticoagulation on hold   Objective: Vitals:   07/16/19 0858 07/16/19 1255  BP: 135/70 137/70  Pulse: 79 73  Resp: 17 18  Temp:  97.6 F (36.4 C)  SpO2: 95% 96%  Intake/Output Summary (Last 24 hours) at 07/16/2019 1331 Last data filed at 07/16/2019 1301 Gross per 24 hour  Intake 677.5 ml  Output 1940 ml  Net -1262.5 ml   Filed Weights   07/14/19 0637 07/15/19 0352 07/16/19 0500  Weight: (!) 175.5 kg (!) 175.5 kg (!) 175.3 kg    Body mass index is 44.66 kg/m.  Exam:   General: Alert and oriented x3, no acute distress  HEENT: Normocephalic and atraumatic, mucous memories slightly dry  Neck: Thick, narrow airway  Cardiovascular: Irregular rhythm, rate controlled  Respiratory: Clear to auscultation bilaterally  Abdomen: Soft, nontender, nondistended, hypoactive bowel sounds  Musculoskeletal: No clubbing or cyanosis, trace pitting edema  Neuro: No focal deficits  Psychiatry: Appropriate, no evidence of psychoses   Data Reviewed: CBC: Recent Labs  Lab 07/12/19 0143 07/13/19 0230 07/14/19 0820 07/15/19 0711 07/15/19 1102 07/15/19 2005 07/16/19 0134 07/16/19 1030  WBC 7.7 7.9 7.2 7.1 7.4  --  8.2 8.2  NEUTROABS 6.0 6.1 5.6  --  5.8  --  6.4  --   HGB 7.9* 8.3* 7.7* 7.5* 7.9* 7.0* 6.6* 7.2*  HCT 26.0* 26.9* 25.6* 25.3* 26.3* 23.6* 22.0* 24.0*  MCV 91.9 93.7 94.8 95.1 94.9  --  94.8 92.3  PLT 172 161 173 171 168  --  163 [id number]   Basic Metabolic Panel: Recent Labs  Lab 07/12/19 0143 07/13/19 0230 07/14/19 0820 07/15/19 0711 07/15/19 1102 07/16/19 0134  NA 137 137 138  --  139 138  K 4.7 4.6 4.8  --  4.5 5.1  CL 105 104 106  --  107 109  CO2 24 23 24   --  24 22  GLUCOSE 111* 123* 103*  --  105* 140*  BUN 29* 24* 24*  --  24* 35*  CREATININE 1.86* 1.58* 1.85*  --  1.77* 1.80*  CALCIUM 8.6* 8.5* 8.2*  --  8.1* 8.1*  MG 1.7 1.6* 1.9 1.9 1.8 1.9  PHOS 4.3 4.0 3.9 3.2 3.0 3.6   GFR: Estimated Creatinine Clearance: 68.5 mL/min (A) (by C-G formula based on SCr of 1.8 mg/dL (H)). Liver Function Tests: Recent Labs  Lab 07/12/19 0143 07/13/19 0230 07/14/19 0820 07/15/19 1102 07/16/19 0134  AST 29 26 25 23 21   ALT 35 32 32 27 26  ALKPHOS 77 78 87 99 102  BILITOT 0.7 0.8 0.9 0.8 1.0  PROT 5.7* 5.8* 5.7* 5.8* 5.4*  ALBUMIN 2.1* 2.1* 2.0* 2.1* 2.1*   No results for input(s): LIPASE, AMYLASE in the last 168 hours. No results for input(s): AMMONIA in the last 168 hours. Coagulation  Profile: Recent Labs  Lab 07/16/19 1030  INR 1.1   Cardiac Enzymes: No results for input(s): CKTOTAL, CKMB, CKMBINDEX, TROPONINI in the last 168 hours. BNP (last 3 results) No results for input(s): PROBNP in the last 8760 hours. HbA1C: No results for input(s): HGBA1C in the last 72 hours. CBG: Recent Labs  Lab 07/15/19 2018 07/16/19 0007 07/16/19 0440 07/16/19 0748 07/16/19 1115  GLUCAP 171* 150* 120* 125* 106*   Lipid Profile: No results for input(s): CHOL, HDL, LDLCALC, TRIG, CHOLHDL, LDLDIRECT in the last 72 hours. Thyroid Function Tests: No results for input(s): TSH, T4TOTAL, FREET4, T3FREE, THYROIDAB in the last 72 hours. Anemia Panel: Recent Labs    07/15/19 1102 07/16/19 0134  FERRITIN 248 239   Urine analysis:    Component Value Date/Time   COLORURINE YELLOW 05/17/2015 1025   APPEARANCEUR CLEAR 05/17/2015 1025   LABSPEC >=1.030 (A) 05/17/2015 1025  PHURINE 5.5 05/17/2015 1025   GLUCOSEU NEGATIVE 05/17/2015 1025   HGBUR MODERATE (A) 05/17/2015 1025   BILIRUBINUR NEGATIVE 05/17/2015 1025   KETONESUR NEGATIVE 05/17/2015 1025   PROTEINUR NEGATIVE 12/13/2014 2120   UROBILINOGEN 0.2 05/17/2015 1025   NITRITE NEGATIVE 05/17/2015 1025   LEUKOCYTESUR NEGATIVE 05/17/2015 1025   Sepsis Labs: @LABRCNTIP (procalcitonin:4,lacticidven:4)  )No results found for this or any previous visit (from the past 240 hour(s)).    Studies: No results found.  Scheduled Meds: . amLODipine  10 mg Oral Daily  . atorvastatin  20 mg Oral q1800  . Chlorhexidine Gluconate Cloth  6 each Topical Daily  . cloNIDine  0.1 mg Oral BID  . cycloSPORINE  1 drop Both Eyes Daily  . gabapentin  300 mg Oral QHS  . insulin aspart  0-15 Units Subcutaneous Q4H  . insulin aspart  10 Units Subcutaneous TID WC  . insulin glargine  40 Units Subcutaneous Daily  . latanoprost  1 drop Both Eyes QHS  . linagliptin  5 mg Oral Daily  . mouth rinse  15 mL Mouth Rinse BID  . metoprolol tartrate  37.5  mg Oral Q6H  . pantoprazole  40 mg Oral Daily  . sodium chloride flush  3 mL Intravenous Q12H    Continuous Infusions: . sodium chloride Stopped (07/01/19 0355)  . sodium chloride 10 mL/hr at 07/14/19 1157     LOS: 19 days     Annita Brod, MD [facility]  To reach me or the doctor on call, go to: [email or web address] Password [email or web address]  07/16/2019, 1:31 PM

## 2019-07-16 NOTE — Progress Notes (Signed)
Type and screen collected by lab, order sent to obtain blood products, IV pump set up with Y site Blood tubing and both lines primed with saline and connected to patient in preparation for blood products to be administered upon arrival.  Patient states feeling fine with no adverse feelings.  VS WNL, consent and education received from patient and in pt chart.

## 2019-07-17 LAB — MAGNESIUM: Magnesium: 1.8 mg/dL (ref 1.7–2.4)

## 2019-07-17 LAB — COMPREHENSIVE METABOLIC PANEL
ALT: 23 U/L (ref 0–44)
AST: 21 U/L (ref 15–41)
Albumin: 2.1 g/dL — ABNORMAL LOW (ref 3.5–5.0)
Alkaline Phosphatase: 100 U/L (ref 38–126)
Anion gap: 7 (ref 5–15)
BUN: 29 mg/dL — ABNORMAL HIGH (ref 8–23)
CO2: 23 mmol/L (ref 22–32)
Calcium: 8.1 mg/dL — ABNORMAL LOW (ref 8.9–10.3)
Chloride: 108 mmol/L (ref 98–111)
Creatinine, Ser: 1.79 mg/dL — ABNORMAL HIGH (ref 0.61–1.24)
GFR calc Af Amer: 44 mL/min — ABNORMAL LOW (ref >60)
GFR calc non Af Amer: 38 mL/min — ABNORMAL LOW (ref >60)
Glucose, Bld: 118 mg/dL — ABNORMAL HIGH (ref 70–99)
Potassium: 4.2 mmol/L (ref 3.5–5.1)
Sodium: 138 mmol/L (ref 135–145)
Total Bilirubin: 0.9 mg/dL (ref 0.3–1.2)
Total Protein: 6 g/dL — ABNORMAL LOW (ref 6.5–8.1)

## 2019-07-17 LAB — PHOSPHORUS: Phosphorus: 3.3 mg/dL (ref 2.5–4.6)

## 2019-07-17 LAB — CBC WITH DIFFERENTIAL/PLATELET
Abs Immature Granulocytes: 0.09 10*3/uL — ABNORMAL HIGH (ref 0.00–0.07)
Basophils Absolute: 0 10*3/uL (ref 0.0–0.1)
Basophils Relative: 0 %
Eosinophils Absolute: 0.3 10*3/uL (ref 0.0–0.5)
Eosinophils Relative: 3 %
HCT: 24.2 % — ABNORMAL LOW (ref 39.0–52.0)
Hemoglobin: 7.2 g/dL — ABNORMAL LOW (ref 13.0–17.0)
Immature Granulocytes: 1 %
Lymphocytes Relative: 11 %
Lymphs Abs: 0.9 10*3/uL (ref 0.7–4.0)
MCH: 27.6 pg (ref 26.0–34.0)
MCHC: 29.8 g/dL — ABNORMAL LOW (ref 30.0–36.0)
MCV: 92.7 fL (ref 80.0–100.0)
Monocytes Absolute: 0.6 10*3/uL (ref 0.1–1.0)
Monocytes Relative: 7 %
Neutro Abs: 6.1 10*3/uL (ref 1.7–7.7)
Neutrophils Relative %: 78 %
Platelets: 172 10*3/uL (ref 150–400)
RBC: 2.61 MIL/uL — ABNORMAL LOW (ref 4.22–5.81)
RDW: 17.2 % — ABNORMAL HIGH (ref 11.5–15.5)
WBC: 7.9 10*3/uL (ref 4.0–10.5)
nRBC: 0.8 % — ABNORMAL HIGH (ref 0.0–0.2)

## 2019-07-17 LAB — GLUCOSE, CAPILLARY
Glucose-Capillary: 116 mg/dL — ABNORMAL HIGH (ref 70–99)
Glucose-Capillary: 139 mg/dL — ABNORMAL HIGH (ref 70–99)
Glucose-Capillary: 156 mg/dL — ABNORMAL HIGH (ref 70–99)
Glucose-Capillary: 156 mg/dL — ABNORMAL HIGH (ref 70–99)
Glucose-Capillary: 96 mg/dL (ref 70–99)
Glucose-Capillary: 97 mg/dL (ref 70–99)
Glucose-Capillary: 98 mg/dL (ref 70–99)

## 2019-07-17 LAB — HEMOGLOBIN AND HEMATOCRIT, BLOOD
HCT: 24.3 % — ABNORMAL LOW (ref 39.0–52.0)
Hemoglobin: 7.4 g/dL — ABNORMAL LOW (ref 13.0–17.0)

## 2019-07-17 LAB — D-DIMER, QUANTITATIVE: D-Dimer, Quant: 5.56 ug/mL-FEU — ABNORMAL HIGH (ref 0.00–0.50)

## 2019-07-17 LAB — C-REACTIVE PROTEIN: CRP: 16.1 mg/dL — ABNORMAL HIGH (ref <1.0)

## 2019-07-17 LAB — FERRITIN: Ferritin: 202 ng/mL (ref 24–336)

## 2019-07-17 MED ORDER — HYDROXYZINE HCL 10 MG PO TABS
10.0000 mg | ORAL_TABLET | Freq: Three times a day (TID) | ORAL | Status: DC | PRN
  Administered 2019-07-17 – 2019-07-19 (×2): 10 mg via ORAL
  Filled 2019-07-17 (×3): qty 1

## 2019-07-17 NOTE — Progress Notes (Signed)
AM BG 116. Helod ss per order parameter.  Discussed with MD, also hold 10 units.  This am.

## 2019-07-17 NOTE — Progress Notes (Signed)
Inpatient Rehabilitation Admissions Coordinator  I contacted pt by his cell phone to discuss goals and expectations of an inpt rehab admit. He last agreed to worked with therapy on 12/29. He states he gets short of breath when moving. I encouraged him to work with therapy as much as able today so that I can pursue insurance approval with [facility] for a possible CIR admit. I also spoke with his wife, Kathi Der, by phone to discuss rehab. Both are in agreement. I await updated therapy notes and then will proceed with insurance authorization.  Danne Baxter, RN, MSN Rehab Admissions Coordinator [phone] 07/17/2019 11:08 AM

## 2019-07-17 NOTE — Progress Notes (Signed)
PROGRESS NOTE  [name] [id number] DOB: 07/02/1951 DOA: 07/04/2019 PCP: Aldine Contes, MD  HPI/Recap of past 36 hours: 69 year old male with past medical history of uncontrolled diabetes type 2 with stage III chronic kidney disease and peripheral neuropathy, obstructive sleep apnea, morbid obesity and atrial fibrillation on Eliquis admitted on 12/15 with 1 week of shortness of breath and found to have Covid along with respiratory failure with hypoxia.  Patient was initially admitted to the ICU on heated high flow.  Patient has since completed a full course of remdesivir and steroids.  On 12/19, he received 1 unit of convalescent plasma.  He is overall continue to improve requiring less oxygen, currently at 3 L.  His hospital course has been complicated by left chest wall hematoma which led to anticoagulation being put on hold.  Patient started having rising D-dimer and with his ongoing A. fib, he was initially transitioned from his Eliquis to full dose Lovenox.  With anticoagulation put on hold, he was started back on heparin drip, but maintained on the low side of the therapeutic range.  On the night of 1/2, patient noted to have some loose stool with frank blood.  Heparin was stopped.  His blood levels had been slowly declining, but had remained relatively stable for approximately 2 days around 7.5.  But on night of 1/2, had dropped down to 6.6 & pts transfused 2 units packed red blood cells.  Posttransfusion, his hemoglobin improved to as high as 7.7.  No further episodes of bleeding this morning.  Hemoglobin down slightly to 7.2.  Noted that his CRP has been trending upward now for several days although clinically, he has been doing okay.  Breathing has been quite stable.  Patient with no complaints.    Assessment/Plan: Principal Problem:   COVID-19 pneumonia with multiple comorbidities leading to acute respiratory failure with hypoxia: Slowly improving.  Weaning down oxygen.   Noted that his CRP has been trending upward.  Monitor closely. Active Problems:   Diabetes type 2, uncontrolled ([place]) with stage III chronic kidney disease and peripheral neuropathy: CBGs have been better stabilized    Morbid obesity ([place]): Meets criteria with BMI greater than 40  Acute kidney injury in the setting of stage III chronic kidney disease: Suspect that this is likely secondary to bleeding.  Should improve with blood transfusion.  Baseline creatinine around 1.5.    Hypertension: Blood pressure on the softer side, likely due to bleeding.    Paroxysmal atrial fibrillation with RVR ([place]): Rate controlled.  Has been on metoprolol, which was held today due to low blood pressures from bleeding.  Anticoagulation on hold.  See above.    Chest wall hematoma, left, subsequent encounter:    Elevated d-dimer    Obesity hypoventilation syndrome (HCC)/OSA: Continue nightly CPAP    Pressure injury of skin: Present on admission  Acute blood loss anemia: Have currently stopped anticoagulation.  Transfusing several units packed red blood cells.  Continue to follow closely.  Glaucoma: Continue eyedrops  Code Status: Full code  Family Communication: Updated son by phone  Disposition Plan: Has been accepted for inpatient rehab, pending insurance approval plus he will be 3 weeks out of his Covid positive test on 1/4.  Need to further stabilize his bleeding   Consultants:  Physical medicine rehab  Critical care  Procedures:  Transfused 2 units packed red blood cells 1/3  Echocardiogram done 12/19: Preserved ejection fraction, no significant valvular dysfunction  Lower extremity Dopplers done 12/20: Negative  for DVT  Antimicrobials:  IV Remdisivir 12/15-12/19  DVT prophylaxis: SCDs currently.  Anticoagulation on hold   Objective: Vitals:   07/17/19 0400 07/17/19 0800  BP: 126/66 140/65  Pulse: 73   Resp: 18   Temp: 98.6 F (37 C) 97.6 F (36.4 C)  SpO2: 90%      Intake/Output Summary (Last 24 hours) at 07/17/2019 1254 Last data filed at 07/17/2019 0900 Gross per 24 hour  Intake 360 ml  Output 1930 ml  Net -1570 ml   Filed Weights   07/14/19 0637 07/15/19 0352 07/16/19 0500  Weight: (!) 175.5 kg (!) 175.5 kg (!) 175.3 kg   Body mass index is 44.66 kg/m.  Exam:   General: Alert and oriented x3, no acute distress  HEENT: Normocephalic and atraumatic, mucous memories slightly dry  Neck: Thick, narrow airway  Cardiovascular: Irregular rhythm, rate controlled  Respiratory: Clear to auscultation bilaterally  Abdomen: Soft, nontender, nondistended, hypoactive bowel sounds  Musculoskeletal: No clubbing or cyanosis, trace pitting edema  Neuro: No focal deficits  Psychiatry: Appropriate, no evidence of psychoses   Data Reviewed: CBC: Recent Labs  Lab 07/13/19 0230 07/14/19 0820 07/15/19 0711 07/15/19 1102 07/15/19 2005 07/16/19 0134 07/16/19 1030 07/16/19 1920 07/17/19 0847  WBC 7.9 7.2 7.1 7.4  --  8.2 8.2  --  7.9  NEUTROABS 6.1 5.6  --  5.8  --  6.4  --   --  6.1  HGB 8.3* 7.7* 7.5* 7.9* 7.0* 6.6* 7.2* 7.7* 7.2*  HCT 26.9* 25.6* 25.3* 26.3* 23.6* 22.0* 24.0* 24.7* 24.2*  MCV 93.7 94.8 95.1 94.9  --  94.8 92.3  --  92.7  PLT 161 173 171 168  --  163 161  --  [id number]   Basic Metabolic Panel: Recent Labs  Lab 07/13/19 0230 07/14/19 0820 07/15/19 0711 07/15/19 1102 07/16/19 0134 07/17/19 0847  NA 137 138  --  139 138 138  K 4.6 4.8  --  4.5 5.1 4.2  CL 104 106  --  107 109 108  CO2 23 24  --  24 22 23   GLUCOSE 123* 103*  --  105* 140* 118*  BUN 24* 24*  --  24* 35* 29*  CREATININE 1.58* 1.85*  --  1.77* 1.80* 1.79*  CALCIUM 8.5* 8.2*  --  8.1* 8.1* 8.1*  MG 1.6* 1.9 1.9 1.8 1.9 1.8  PHOS 4.0 3.9 3.2 3.0 3.6 3.3   GFR: Estimated Creatinine Clearance: 68.9 mL/min (A) (by C-G formula based on SCr of 1.79 mg/dL (H)). Liver Function Tests: Recent Labs  Lab 07/13/19 0230 07/14/19 0820 07/15/19 1102  07/16/19 0134 07/17/19 0847  AST 26 25 23 21 21   ALT 32 32 27 26 23   ALKPHOS 78 87 99 102 100  BILITOT 0.8 0.9 0.8 1.0 0.9  PROT 5.8* 5.7* 5.8* 5.4* 6.0*  ALBUMIN 2.1* 2.0* 2.1* 2.1* 2.1*   No results for input(s): LIPASE, AMYLASE in the last 168 hours. No results for input(s): AMMONIA in the last 168 hours. Coagulation Profile: Recent Labs  Lab 07/16/19 1030  INR 1.1   Cardiac Enzymes: No results for input(s): CKTOTAL, CKMB, CKMBINDEX, TROPONINI in the last 168 hours. BNP (last 3 results) No results for input(s): PROBNP in the last 8760 hours. HbA1C: No results for input(s): HGBA1C in the last 72 hours. CBG: Recent Labs  Lab 07/16/19 2008 07/16/19 2300 07/17/19 0407 07/17/19 0801 07/17/19 1123  GLUCAP 136* 117* 96 116* 156*   Lipid Profile: No results for input(s):  CHOL, HDL, LDLCALC, TRIG, CHOLHDL, LDLDIRECT in the last 72 hours. Thyroid Function Tests: No results for input(s): TSH, T4TOTAL, FREET4, T3FREE, THYROIDAB in the last 72 hours. Anemia Panel: Recent Labs    07/16/19 0134 07/17/19 0847  FERRITIN 239 202   Urine analysis:    Component Value Date/Time   COLORURINE YELLOW 05/17/2015 1025   APPEARANCEUR CLEAR 05/17/2015 1025   LABSPEC >=1.030 (A) 05/17/2015 1025   PHURINE 5.5 05/17/2015 1025   GLUCOSEU NEGATIVE 05/17/2015 1025   HGBUR MODERATE (A) 05/17/2015 1025   BILIRUBINUR NEGATIVE 05/17/2015 1025   KETONESUR NEGATIVE 05/17/2015 1025   PROTEINUR NEGATIVE 12/13/2014 2120   UROBILINOGEN 0.2 05/17/2015 1025   NITRITE NEGATIVE 05/17/2015 1025   LEUKOCYTESUR NEGATIVE 05/17/2015 1025   Sepsis Labs: @LABRCNTIP (procalcitonin:4,lacticidven:4)  )No results found for this or any previous visit (from the past 240 hour(s)).    Studies: No results found.  Scheduled Meds: . amLODipine  10 mg Oral Daily  . atorvastatin  20 mg Oral q1800  . Chlorhexidine Gluconate Cloth  6 each Topical Daily  . cloNIDine  0.1 mg Oral BID  . cycloSPORINE  1 drop  Both Eyes Daily  . gabapentin  300 mg Oral QHS  . insulin aspart  0-15 Units Subcutaneous Q4H  . insulin aspart  10 Units Subcutaneous TID WC  . insulin glargine  40 Units Subcutaneous Daily  . latanoprost  1 drop Both Eyes QHS  . linagliptin  5 mg Oral Daily  . mouth rinse  15 mL Mouth Rinse BID  . metoprolol tartrate  37.5 mg Oral Q6H  . pantoprazole  40 mg Oral Daily  . sodium chloride flush  3 mL Intravenous Q12H    Continuous Infusions: . sodium chloride Stopped (07/01/19 0355)  . sodium chloride 10 mL/hr at 07/14/19 1157     LOS: 20 days     Annita Brod, MD [facility]  To reach me or the doctor on call, go to: [email or web address] Password [email or web address]  07/17/2019, 12:54 PM

## 2019-07-17 NOTE — Plan of Care (Signed)

## 2019-07-17 NOTE — Progress Notes (Signed)
Physical Therapy Treatment Patient Details Name: [name] MRN: [id number] DOB: 1950-08-03 Today's Date: 07/17/2019    History of Present Illness 69 yo with T2DM c/b peripheral neuropathy, OSA, morbid obesity, CKD III, atrial fibrillation, hypertension, chronic pain, anemia, and multiple other medical problems who presented on 12/15 with 1 week of SOB, difficulty sleeping, poor PO intake, and presyncopal symptoms.  He was admitted from the internal medicine clinic to [facility] then transferred to [facility].     PT Comments    The patient received in bed on 3 L [place] , SPO2 92%. Patient  Mobilized to sitting omn=n bed ede, SPO2 dropping to 87( finger probe). Could not get ear probe to stay. Using RW, ambulated x 6 ;' on 4 L, SPO2 87%(finger). Patient requires resting breaks after each mobility transition. The patient expresses that he will try to do his bes. Continue PT and  Recommending CIR for rehab.   Follow Up Recommendations  CIR     Equipment Recommendations  Other (comment)    Recommendations for Other Services Rehab consult     Precautions / Restrictions Precautions Precautions: Fall Precaution Comments: desats with activity; fell at home PTA    Mobility  Bed Mobility Overal bed mobility: Needs Assistance Bed Mobility: Supine to Sit Rolling: Min guard Sidelying to sit: HOB elevated;Min guard       General bed mobility comments: Use of bedrail, moved fast to sitting  Transfers Overall transfer level: Needs assistance Equipment used: Rolling walker (2 wheeled) Transfers: Sit to/from Stand Sit to Stand: Min assist;+2 safety/equipment Stand pivot transfers: +2 safety/equipment       General transfer comment: patient sat on bed edge x 9 minutes to rest, due to SOB. SPO2 on 4 L 87% with mobility  Ambulation/Gait Ambulation/Gait assistance: +2 safety/equipment;+2 physical assistance;Mod assist Gait Distance (Feet): 6 Feet Assistive device: Rolling walker (2  wheeled) Gait Pattern/deviations: Step-to pattern;Decreased stride length;Trunk flexed Gait velocity: slow as instructed to help control his dyspnea   General Gait Details: pt. reports dizziness upon quickly standing. did ambu;atye x 6' with Rw on 4 L SPo2 not picking up well, ~ 87%. decreased to 3 L after resting with SPO2 92%   Stairs             Wheelchair Mobility    Modified Rankin (Stroke Patients Only)       Balance Overall balance assessment: Needs assistance Sitting-balance support: Feet supported Sitting balance-Leahy Scale: Good     Standing balance support: Bilateral upper extremity supported Standing balance-Leahy Scale: Poor Standing balance comment: RELIANT ON EXTERNAL SUPPORT                            Cognition Arousal/Alertness: Awake/alert Behavior During Therapy: WFL for tasks assessed/performed                                   General Comments: was positive today about getting up and "doing my best'      Exercises      General Comments        Pertinent Vitals/Pain Pain Assessment: Faces Faces Pain Scale: Hurts little more Pain Location: generalized Pain Descriptors / Indicators: Discomfort Pain Intervention(s): Monitored during session    Home Living                      Prior Function  PT Goals (current goals can now be found in the care plan section) Progress towards PT goals: Progressing toward goals    Frequency    Min 3X/week      PT Plan Current plan remains appropriate    Co-evaluation              AM-PAC PT "6 Clicks" Mobility   Outcome Measure  Help needed turning from your back to your side while in a flat bed without using bedrails?: A Little Help needed moving from lying on your back to sitting on the side of a flat bed without using bedrails?: A Little Help needed moving to and from a bed to a chair (including a wheelchair)?: A Little Help needed  standing up from a chair using your arms (e.g., wheelchair or bedside chair)?: A Little Help needed to walk in hospital room?: A Lot Help needed climbing 3-5 steps with a railing? : Total 6 Click Score: 15    End of Session Equipment Utilized During Treatment: Oxygen Activity Tolerance: Patient tolerated treatment well;Patient limited by fatigue Patient left: in chair;with call bell/phone within reach Nurse Communication: Mobility status PT Visit Diagnosis: Unsteadiness on feet (R26.81);Other abnormalities of gait and mobility (R26.89);Muscle weakness (generalized) (M62.81)     Time: [id number] PT Time Calculation (min) (ACUTE ONLY): 45 min  Charges:  $Therapeutic Activity: 38-52 mins                     Tresa Endo PT Acute Rehabilitation Services Pager [phone] Office [phone]    Claretha Cooper 07/17/2019, 1:22 PM

## 2019-07-18 LAB — GLUCOSE, CAPILLARY
Glucose-Capillary: 95 mg/dL (ref 70–99)
Glucose-Capillary: 119 mg/dL — ABNORMAL HIGH (ref 70–99)
Glucose-Capillary: 114 mg/dL — ABNORMAL HIGH (ref 70–99)
Glucose-Capillary: 132 mg/dL — ABNORMAL HIGH (ref 70–99)
Glucose-Capillary: 110 mg/dL — ABNORMAL HIGH (ref 70–99)

## 2019-07-18 LAB — COMPREHENSIVE METABOLIC PANEL
ALT: 21 U/L (ref 0–44)
AST: 23 U/L (ref 15–41)
Albumin: 2.2 g/dL — ABNORMAL LOW (ref 3.5–5.0)
Alkaline Phosphatase: 95 U/L (ref 38–126)
Anion gap: 11 (ref 5–15)
BUN: 29 mg/dL — ABNORMAL HIGH (ref 8–23)
CO2: 21 mmol/L — ABNORMAL LOW (ref 22–32)
Calcium: 8 mg/dL — ABNORMAL LOW (ref 8.9–10.3)
Chloride: 107 mmol/L (ref 98–111)
Creatinine, Ser: 1.68 mg/dL — ABNORMAL HIGH (ref 0.61–1.24)
GFR calc Af Amer: 47 mL/min — ABNORMAL LOW (ref >60)
GFR calc non Af Amer: 41 mL/min — ABNORMAL LOW (ref >60)
Glucose, Bld: 106 mg/dL — ABNORMAL HIGH (ref 70–99)
Potassium: 4.1 mmol/L (ref 3.5–5.1)
Sodium: 139 mmol/L (ref 135–145)
Total Bilirubin: 1 mg/dL (ref 0.3–1.2)
Total Protein: 6 g/dL — ABNORMAL LOW (ref 6.5–8.1)

## 2019-07-18 LAB — CBC WITH DIFFERENTIAL/PLATELET
Abs Immature Granulocytes: 0.1 10*3/uL — ABNORMAL HIGH (ref 0.00–0.07)
Basophils Absolute: 0 10*3/uL (ref 0.0–0.1)
Basophils Relative: 0 %
Eosinophils Absolute: 0.3 10*3/uL (ref 0.0–0.5)
Eosinophils Relative: 3 %
HCT: 24.2 % — ABNORMAL LOW (ref 39.0–52.0)
Hemoglobin: 7.2 g/dL — ABNORMAL LOW (ref 13.0–17.0)
Immature Granulocytes: 1 %
Lymphocytes Relative: 12 %
Lymphs Abs: 1 10*3/uL (ref 0.7–4.0)
MCH: 27.6 pg (ref 26.0–34.0)
MCHC: 29.8 g/dL — ABNORMAL LOW (ref 30.0–36.0)
MCV: 92.7 fL (ref 80.0–100.0)
Monocytes Absolute: 0.7 10*3/uL (ref 0.1–1.0)
Monocytes Relative: 8 %
Neutro Abs: 6.3 10*3/uL (ref 1.7–7.7)
Neutrophils Relative %: 76 %
Platelets: 148 10*3/uL — ABNORMAL LOW (ref 150–400)
RBC: 2.61 MIL/uL — ABNORMAL LOW (ref 4.22–5.81)
RDW: 17.2 % — ABNORMAL HIGH (ref 11.5–15.5)
WBC: 8.3 10*3/uL (ref 4.0–10.5)
nRBC: 1.7 % — ABNORMAL HIGH (ref 0.0–0.2)

## 2019-07-18 LAB — D-DIMER, QUANTITATIVE: D-Dimer, Quant: 11.09 ug/mL-FEU — ABNORMAL HIGH (ref 0.00–0.50)

## 2019-07-18 LAB — PROCALCITONIN: Procalcitonin: 0.61 ng/mL

## 2019-07-18 LAB — PHOSPHORUS: Phosphorus: 3.5 mg/dL (ref 2.5–4.6)

## 2019-07-18 LAB — MAGNESIUM: Magnesium: 1.7 mg/dL (ref 1.7–2.4)

## 2019-07-18 LAB — PREPARE RBC (CROSSMATCH)

## 2019-07-18 LAB — C-REACTIVE PROTEIN: CRP: 20 mg/dL — ABNORMAL HIGH (ref <1.0)

## 2019-07-18 LAB — FERRITIN: Ferritin: 203 ng/mL (ref 24–336)

## 2019-07-18 MED ORDER — FUROSEMIDE 10 MG/ML IJ SOLN
20.0000 mg | Freq: Once | INTRAMUSCULAR | Status: AC
  Administered 2019-07-19: 07:00:00 20 mg via INTRAVENOUS
  Filled 2019-07-18: qty 2

## 2019-07-18 MED ORDER — FUROSEMIDE 10 MG/ML IJ SOLN
20.0000 mg | Freq: Once | INTRAMUSCULAR | Status: AC
  Administered 2019-07-19: 20 mg via INTRAVENOUS
  Filled 2019-07-18: qty 2

## 2019-07-18 MED ORDER — SODIUM CHLORIDE 0.9% IV SOLUTION: Freq: Once | INTRAVENOUS | Status: DC

## 2019-07-18 NOTE — Progress Notes (Addendum)
Occupational Therapy Treatment Patient Details Name: [name] MRN: [id number] DOB: [birth year] Today's Date: 07/18/2019    History of present illness 69 yo with T2DM c/b peripheral neuropathy, OSA, morbid obesity, CKD III, atrial fibrillation, hypertension, chronic pain, anemia, and multiple other medical problems who presented on 12/15 with 1 week of SOB, difficulty sleeping, poor PO intake, and presyncopal symptoms.  He was admitted from the internal medicine clinic to [facility] then transferred to [facility].    OT comments  Pt progressing towards established OT goals and presenting with increased motivation to participate in therapy despite significant pain and fatigue. Pt performing bed mobility with HOB elevated and Min Guard A for safety. Pt requiring seated rest break at EOB at 10L O2. SpO2 dropping into 70s (use of nelcor to monitor O2), and pt able to slowly return to 94-88% with 15L O2. Pt performing functional mobility to recliner with Min Guard-Min A and RW. SpO2 continues to drop with movement and pt requiring seated rest breaks and 15L O2 to recover to high 80s. RR 30-40s. Pt performing flutter valve exercises in recliner (10 reps). Educating pt on edema management for scrotal edema and assisting to elevate area while pt seated in recliner. Continue to recommend dc to post-acute rehab and will continue to follow acutely as admitted.    Follow Up Recommendations  CIR;Supervision/Assistance - 24 hour    Equipment Recommendations  3 in 1 bedside commode    Recommendations for Other Services Rehab consult    Precautions / Restrictions Precautions Precautions: Fall Precaution Comments: desats with activity; fell at home PTA Restrictions Weight Bearing Restrictions: No       Mobility Bed Mobility Overal bed mobility: Needs Assistance Bed Mobility: Supine to Sit   Sidelying to sit: HOB elevated;Min guard       General bed mobility comments: Pt elevating HOB to assist  with power up into upright position. Min Guard A for safety. Pt then requiring seated rest break at the EOB due to fatigue and Spo2 dropping to 70-80s  Transfers Overall transfer level: Needs assistance Equipment used: Rolling walker (2 wheeled) Transfers: Sit to/from Stand Sit to Stand: Min assist;From elevated surface         General transfer comment: Min A for gaining balance.     Balance Overall balance assessment: Needs assistance Sitting-balance support: Feet supported Sitting balance-Leahy Scale: Good     Standing balance support: Bilateral upper extremity supported Standing balance-Leahy Scale: Poor Standing balance comment: Reliant on UE support                           ADL either performed or assessed with clinical judgement   ADL Overall ADL's : Needs assistance/impaired                         Toilet Transfer: Min guard;Ambulation(short distance to recliner)           Functional mobility during ADLs: Min guard;Rolling walker(Short distance) General ADL Comments: Pt motivated to particiapte in therapy and perform OOB activity. Pt requiring MIn Guard A for transfer to recliner. Requiring significant rest breaks and 15L O2 to maintain SpO2 in 80s.      Vision       Perception     Praxis      Cognition Arousal/Alertness: Awake/alert Behavior During Therapy: WFL for tasks assessed/performed Overall Cognitive Status: No family/caregiver present to determine baseline cognitive functioning  General Comments: Pt motivated to participate with therapy today. Pt with "mumbling" speech and HOH.         Exercises Exercises: Other exercises Other Exercises Other Exercises: Educating pt on techniques for edema management (as pt presenting with scrotal edema). Elevating scrotum while pt sititng in recliner Other Exercises: Flutter valve for 10 reps with rest breaks between breaths   Shoulder  Instructions       General Comments Pt requiring 15L O2 to maintain SpO2 in 80s. RR elevaitng to 30-40s. Cues for purse lip breathing.     Pertinent Vitals/ Pain       Pain Assessment: Faces Faces Pain Scale: Hurts even more Pain Location: Wound at buttock Pain Descriptors / Indicators: Discomfort;Constant;Grimacing;Guarding Pain Intervention(s): Limited activity within patient's tolerance;Monitored during session;Repositioned;Premedicated before session  Home Living                                          Prior Functioning/Environment              Frequency  Min 2X/week        Progress Toward Goals  OT Goals(current goals can now be found in the care plan section)  Progress towards OT goals: Progressing toward goals  Acute Rehab OT Goals Patient Stated Goal: to get stronger and go home OT Goal Formulation: With patient Time For Goal Achievement: 07/18/19 Potential to Achieve Goals: Good ADL Goals Pt Will Perform Upper Body Bathing: with set-up;sitting Pt Will Perform Lower Body Bathing: with mod assist;sit to/from stand;with adaptive equipment Pt Will Transfer to Toilet: with +2 assist;with min guard assist;bedside commode;stand pivot transfer  Plan Discharge plan remains appropriate    Co-evaluation                 AM-PAC OT "6 Clicks" Daily Activity     Outcome Measure   Help from another person eating meals?: None Help from another person taking care of personal grooming?: A Little Help from another person toileting, which includes using toliet, bedpan, or urinal?: A Lot Help from another person bathing (including washing, rinsing, drying)?: A Lot Help from another person to put on and taking off regular upper body clothing?: A Little Help from another person to put on and taking off regular lower body clothing?: A Lot 6 Click Score: 16    End of Session Equipment Utilized During Treatment: Rolling walker;Oxygen  OT Visit  Diagnosis: Unsteadiness on feet (R26.81);Other abnormalities of gait and mobility (R26.89);History of falling (Z91.81);Muscle weakness (generalized) (M62.81);Other symptoms and signs involving cognitive function;Pain Pain - Right/Left: Right Pain - part of body: Hip   Activity Tolerance Patient limited by fatigue;Patient limited by pain(Limited by SOB)   Patient Left in chair;with call bell/phone within reach;with chair alarm set   Nurse Communication Mobility status        Time: [id number] OT Time Calculation (min): 33 min  Charges: OT General Charges $OT Visit: 1 Visit OT Treatments $Self Care/Home Management : 8-22 mins $Therapeutic Activity: 8-22 mins  Maha Fischel MSOT, OTR/L Acute Rehab Pager: [phone] Office: [place] 07/18/2019, 3:49 PM

## 2019-07-18 NOTE — Progress Notes (Signed)
Inpatient Rehabilitation Admissions Coordinator  Insurance requesting updated OT treatment in last 48 hrs. I contacted Selena Batten, therapy site coordinator to request OT today.  Danne Baxter, RN, MSN Rehab Admissions Coordinator [phone] 07/18/2019 12:16 PM

## 2019-07-18 NOTE — Progress Notes (Addendum)
PROGRESS NOTE  [name] [id number] DOB: [birth year] DOA: 06/26/2019 PCP: Aldine Contes, MD  HPI/Recap of past 55 hours: 69 year old male with past medical history of uncontrolled diabetes type 2 with stage III chronic kidney disease and peripheral neuropathy, obstructive sleep apnea, morbid obesity and atrial fibrillation on Eliquis admitted on 12/15 with 1 week of shortness of breath and found to have Covid along with respiratory failure with hypoxia.  Patient was initially admitted to the ICU on heated high flow.  Patient has since completed a full course of remdesivir and steroids.  On 12/19, he received 1 unit of convalescent plasma.  He is overall continue to improve requiring less oxygen, currently at 3 L.  His hospital course has been complicated by left chest wall hematoma which led to anticoagulation being put on hold.  Patient started having rising D-dimer and with his ongoing A. fib, he was initially transitioned from his Eliquis to full dose Lovenox.  With anticoagulation put on hold, he was started back on heparin drip, but maintained on the low side of the therapeutic range.  On the night of 1/2, patient noted to have some loose stool with frank blood.  Heparin was stopped.  His blood levels had been slowly declining, but had remained relatively stable for approximately 2 days around 7.5.  But on night of 1/2, had dropped down to 6.6 & pts transfused 2 units packed red blood cells.  Posttransfusion, his hemoglobin improved to as high as 7.7 & has since settled to 7.2.  Today, patient having some pain as he missed a dose of his pain medication last night after falling asleep before asking for second dose.  At times, when he gets agitated or with lots of activity, oxygenation needs to go up and he is currently on 6 L.  This goes back down to his current 3 L when he is calm.  Potential candidate for inpatient rehab.  Also noted to have continually trending upward CRP  level.  Addendum: By this evening, patient quite hypoxic, difficult to get his oxygen levels to 90%, requiring facemask and nasal cannula at 15 L.  Once calm, needs less oxygen.  Will transfuse 2 units packed red blood cells to try to improve his oxygenation reserve  Assessment/Plan: Principal Problem:   COVID-19 pneumonia with multiple comorbidities leading to acute respiratory failure with hypoxia: Slowly improving.  Weaning down oxygen.  Noted that his CRP has been trending upward.  We will go ahead and check a procalcitonin level to ensure no new bacterial infection.  If elevated, start antibiotics.  If normal, check lower extremity Doppler to rule out DVT/PE. (Patient with renal dysfunction so would not recommend CT angiogram with contrast) Active Problems:   Diabetes type 2, uncontrolled ([place]) with stage III chronic kidney disease and peripheral neuropathy: CBGs have been better stabilized    Morbid obesity ([place]): Meets criteria with BMI greater than 40  Acute kidney injury in the setting of stage III chronic kidney disease: Suspect that this is likely secondary to bleeding.  Should improve with blood transfusion.  Baseline creatinine around 1.5.    Hypertension: Blood pressure on the softer side, likely due to bleeding.    Paroxysmal atrial fibrillation with RVR ([place]): Rate controlled.  Has been on metoprolol, which was held today due to low blood pressures from bleeding.  Anticoagulation on hold.  See above.    Chest wall hematoma, left, subsequent encounter: Appears stable at this time.    Elevated d-dimer:  Has trended upwards.    Obesity hypoventilation syndrome (HCC)/OSA: Continue nightly CPAP    Pressure injury of skin: Present on admission  Acute blood loss anemia: Have currently stopped anticoagulation.  Transfusing several units packed red blood cells.  Continue to follow closely.  H&H has been stable for the last few days  Glaucoma: Continue eyedrops  Code Status: Full  code  Family Communication: Updated son by phone  Disposition Plan: Has been accepted for inpatient rehab, pending insurance approval and has now been out of his Covid positive test as of 1/4.  Could transfer once they approve him.  Consultants:  Physical medicine rehab  Critical care  Procedures:  Transfused 2 units packed red blood cells 1/3  Echocardiogram done 12/19: Preserved ejection fraction, no significant valvular dysfunction  Lower extremity Dopplers done 12/20: Negative for DVT  Antimicrobials:  IV Remdisivir 12/15-12/19  DVT prophylaxis: SCDs currently.  Anticoagulation on hold   Objective: Vitals:   07/18/19 0431 07/18/19 1200  BP: 125/70 112/77  Pulse: 87 80  Resp: 19   Temp: 99.2 F (37.3 C) 98.5 F (36.9 C)  SpO2: (!) 89% 92%    Intake/Output Summary (Last 24 hours) at 07/18/2019 1405 Last data filed at 07/18/2019 1300 Gross per 24 hour  Intake 360 ml  Output 2200 ml  Net -1840 ml   Filed Weights   07/14/19 0637 07/15/19 0352 07/16/19 0500  Weight: (!) 175.5 kg (!) 175.5 kg (!) 175.3 kg   Body mass index is 44.66 kg/m.  Exam:   General: Alert and oriented x3, no acute distress  HEENT: Normocephalic and atraumatic, mucous memories slightly dry  Neck: Thick, narrow airway  Cardiovascular: Irregular rhythm, rate controlled  Respiratory: Clear to auscultation bilaterally  Abdomen: Soft, nontender, nondistended, hypoactive bowel sounds  Musculoskeletal: No clubbing or cyanosis, trace pitting edema  Neuro: No focal deficits  Psychiatry: Appropriate, no evidence of psychoses   Data Reviewed: CBC: Recent Labs  Lab 07/14/19 0820 07/15/19 1102 07/16/19 0134 07/16/19 1030 07/16/19 1920 07/17/19 0847 07/17/19 1622 07/18/19 0640  WBC 7.2 7.4 8.2 8.2  --  7.9  --  8.3  NEUTROABS 5.6 5.8 6.4  --   --  6.1  --  6.3  HGB 7.7* 7.9* 6.6* 7.2* 7.7* 7.2* 7.4* 7.2*  HCT 25.6* 26.3* 22.0* 24.0* 24.7* 24.2* 24.3* 24.2*  MCV 94.8 94.9  94.8 92.3  --  92.7  --  92.7  PLT 173 168 163 161  --  172  --  123456*   Basic Metabolic Panel: Recent Labs  Lab 07/14/19 0820 07/15/19 0711 07/15/19 1102 07/16/19 0134 07/17/19 0847 07/18/19 0640  NA 138  --  139 138 138 139  K 4.8  --  4.5 5.1 4.2 4.1  CL 106  --  107 109 108 107  CO2 24  --  24 22 23  21*  GLUCOSE 103*  --  105* 140* 118* 106*  BUN 24*  --  24* 35* 29* 29*  CREATININE 1.85*  --  1.77* 1.80* 1.79* 1.68*  CALCIUM 8.2*  --  8.1* 8.1* 8.1* 8.0*  MG 1.9 1.9 1.8 1.9 1.8 1.7  PHOS 3.9 3.2 3.0 3.6 3.3 3.5   GFR: Estimated Creatinine Clearance: 73.4 mL/min (A) (by C-G formula based on SCr of 1.68 mg/dL (H)). Liver Function Tests: Recent Labs  Lab 07/14/19 0820 07/15/19 1102 07/16/19 0134 07/17/19 0847 07/18/19 0640  AST 25 23 21 21 23   ALT 32 27 26 23 21   ALKPHOS  87 99 102 100 95  BILITOT 0.9 0.8 1.0 0.9 1.0  PROT 5.7* 5.8* 5.4* 6.0* 6.0*  ALBUMIN 2.0* 2.1* 2.1* 2.1* 2.2*   No results for input(s): LIPASE, AMYLASE in the last 168 hours. No results for input(s): AMMONIA in the last 168 hours. Coagulation Profile: Recent Labs  Lab 07/16/19 1030  INR 1.1   Cardiac Enzymes: No results for input(s): CKTOTAL, CKMB, CKMBINDEX, TROPONINI in the last 168 hours. BNP (last 3 results) No results for input(s): PROBNP in the last 8760 hours. HbA1C: No results for input(s): HGBA1C in the last 72 hours. CBG: Recent Labs  Lab 07/17/19 1951 07/17/19 2343 07/18/19 0428 07/18/19 0758 07/18/19 1127  GLUCAP 156* 98 95 119* 114*   Lipid Profile: No results for input(s): CHOL, HDL, LDLCALC, TRIG, CHOLHDL, LDLDIRECT in the last 72 hours. Thyroid Function Tests: No results for input(s): TSH, T4TOTAL, FREET4, T3FREE, THYROIDAB in the last 72 hours. Anemia Panel: Recent Labs    07/17/19 0847 07/18/19 0640  FERRITIN 202 203   Urine analysis:    Component Value Date/Time   COLORURINE YELLOW 05/17/2015 1025   APPEARANCEUR CLEAR 05/17/2015 1025   LABSPEC  >=1.030 (A) 05/17/2015 1025   PHURINE 5.5 05/17/2015 1025   GLUCOSEU NEGATIVE 05/17/2015 1025   HGBUR MODERATE (A) 05/17/2015 1025   BILIRUBINUR NEGATIVE 05/17/2015 1025   KETONESUR NEGATIVE 05/17/2015 1025   PROTEINUR NEGATIVE 12/13/2014 2120   UROBILINOGEN 0.2 05/17/2015 1025   NITRITE NEGATIVE 05/17/2015 1025   LEUKOCYTESUR NEGATIVE 05/17/2015 1025   Sepsis Labs: @LABRCNTIP (procalcitonin:4,lacticidven:4)  )No results found for this or any previous visit (from the past 240 hour(s)).    Studies: No results found.  Scheduled Meds: . amLODipine  10 mg Oral Daily  . atorvastatin  20 mg Oral q1800  . Chlorhexidine Gluconate Cloth  6 each Topical Daily  . cloNIDine  0.1 mg Oral BID  . cycloSPORINE  1 drop Both Eyes Daily  . gabapentin  300 mg Oral QHS  . insulin aspart  0-15 Units Subcutaneous Q4H  . insulin aspart  10 Units Subcutaneous TID WC  . insulin glargine  40 Units Subcutaneous Daily  . latanoprost  1 drop Both Eyes QHS  . linagliptin  5 mg Oral Daily  . mouth rinse  15 mL Mouth Rinse BID  . metoprolol tartrate  37.5 mg Oral Q6H  . pantoprazole  40 mg Oral Daily  . sodium chloride flush  3 mL Intravenous Q12H    Continuous Infusions: . sodium chloride Stopped (07/01/19 0355)  . sodium chloride 10 mL/hr at 07/14/19 1157     LOS: 21 days     Annita Brod, MD [facility]  To reach me or the doctor on call, go to: [email or web address] Password [email or web address]  07/18/2019, 2:05 PM

## 2019-07-19 ENCOUNTER — Other Ambulatory Visit: Payer: Self-pay

## 2019-07-19 ENCOUNTER — Inpatient Hospital Stay (HOSPITAL_COMMUNITY): Payer: Medicare Other

## 2019-07-19 DIAGNOSIS — J9601 Acute respiratory failure with hypoxia: Secondary | ICD-10-CM

## 2019-07-19 LAB — CBC WITH DIFFERENTIAL/PLATELET
Abs Immature Granulocytes: 0.09 10*3/uL — ABNORMAL HIGH (ref 0.00–0.07)
Basophils Absolute: 0 10*3/uL (ref 0.0–0.1)
Basophils Relative: 0 %
Eosinophils Absolute: 0.2 10*3/uL (ref 0.0–0.5)
Eosinophils Relative: 2 %
HCT: 28.7 % — ABNORMAL LOW (ref 39.0–52.0)
Hemoglobin: 8.9 g/dL — ABNORMAL LOW (ref 13.0–17.0)
Immature Granulocytes: 1 %
Lymphocytes Relative: 8 %
Lymphs Abs: 0.8 10*3/uL (ref 0.7–4.0)
MCH: 28.3 pg (ref 26.0–34.0)
MCHC: 31 g/dL (ref 30.0–36.0)
MCV: 91.4 fL (ref 80.0–100.0)
Monocytes Absolute: 0.7 10*3/uL (ref 0.1–1.0)
Monocytes Relative: 7 %
Neutro Abs: 8.2 10*3/uL — ABNORMAL HIGH (ref 1.7–7.7)
Neutrophils Relative %: 82 %
Platelets: 130 10*3/uL — ABNORMAL LOW (ref 150–400)
RBC: 3.14 MIL/uL — ABNORMAL LOW (ref 4.22–5.81)
RDW: 16.7 % — ABNORMAL HIGH (ref 11.5–15.5)
WBC: 9.9 10*3/uL (ref 4.0–10.5)
nRBC: 0.9 % — ABNORMAL HIGH (ref 0.0–0.2)

## 2019-07-19 LAB — PROCALCITONIN: Procalcitonin: 2.3 ng/mL

## 2019-07-19 LAB — FERRITIN: Ferritin: 243 ng/mL (ref 24–336)

## 2019-07-19 LAB — POCT I-STAT 7, (LYTES, BLD GAS, ICA,H+H)
Acid-base deficit: 3 mmol/L — ABNORMAL HIGH (ref 0.0–2.0)
Acid-base deficit: 8 mmol/L — ABNORMAL HIGH (ref 0.0–2.0)
Bicarbonate: 21.5 mmol/L (ref 20.0–28.0)
Bicarbonate: 23.5 mmol/L (ref 20.0–28.0)
Calcium, Ion: 1.17 mmol/L (ref 1.15–1.40)
Calcium, Ion: 1.2 mmol/L (ref 1.15–1.40)
HCT: 29 % — ABNORMAL LOW (ref 39.0–52.0)
HCT: 30 % — ABNORMAL LOW (ref 39.0–52.0)
Hemoglobin: 9.9 g/dL — ABNORMAL LOW (ref 13.0–17.0)
Hemoglobin: 10.2 g/dL — ABNORMAL LOW (ref 13.0–17.0)
O2 Saturation: 63 %
O2 Saturation: 90 %
Patient temperature: 98.6
Patient temperature: 98.5
Potassium: 4.5 mmol/L (ref 3.5–5.1)
Potassium: 5.5 mmol/L — ABNORMAL HIGH (ref 3.5–5.1)
Sodium: 142 mmol/L (ref 135–145)
Sodium: 142 mmol/L (ref 135–145)
TCO2: 23 mmol/L (ref 22–32)
TCO2: 26 mmol/L (ref 22–32)
pCO2 arterial: 35.9 mmHg (ref 32.0–48.0)
pCO2 arterial: 88 mmHg (ref 32.0–48.0)
pH, Arterial: 7.386 (ref 7.350–7.450)
pH, Arterial: 7.034 — CL (ref 7.350–7.450)
pO2, Arterial: 33 mmHg — CL (ref 83.0–108.0)
pO2, Arterial: 86 mmHg (ref 83.0–108.0)

## 2019-07-19 LAB — INFLUENZA PANEL BY PCR (TYPE A & B)
Influenza A By PCR: NEGATIVE
Influenza B By PCR: NEGATIVE

## 2019-07-19 LAB — GLUCOSE, CAPILLARY
Glucose-Capillary: 110 mg/dL — ABNORMAL HIGH (ref 70–99)
Glucose-Capillary: 119 mg/dL — ABNORMAL HIGH (ref 70–99)
Glucose-Capillary: 158 mg/dL — ABNORMAL HIGH (ref 70–99)
Glucose-Capillary: 179 mg/dL — ABNORMAL HIGH (ref 70–99)
Glucose-Capillary: 185 mg/dL — ABNORMAL HIGH (ref 70–99)
Glucose-Capillary: 88 mg/dL (ref 70–99)

## 2019-07-19 LAB — COMPREHENSIVE METABOLIC PANEL
ALT: 24 U/L (ref 0–44)
AST: 31 U/L (ref 15–41)
Albumin: 2.2 g/dL — ABNORMAL LOW (ref 3.5–5.0)
Alkaline Phosphatase: 135 U/L — ABNORMAL HIGH (ref 38–126)
Anion gap: 12 (ref 5–15)
BUN: 31 mg/dL — ABNORMAL HIGH (ref 8–23)
CO2: 20 mmol/L — ABNORMAL LOW (ref 22–32)
Calcium: 8.2 mg/dL — ABNORMAL LOW (ref 8.9–10.3)
Chloride: 108 mmol/L (ref 98–111)
Creatinine, Ser: 1.92 mg/dL — ABNORMAL HIGH (ref 0.61–1.24)
GFR calc Af Amer: 40 mL/min — ABNORMAL LOW (ref >60)
GFR calc non Af Amer: 35 mL/min — ABNORMAL LOW (ref >60)
Glucose, Bld: 181 mg/dL — ABNORMAL HIGH (ref 70–99)
Potassium: 4.2 mmol/L (ref 3.5–5.1)
Sodium: 140 mmol/L (ref 135–145)
Total Bilirubin: 2.1 mg/dL — ABNORMAL HIGH (ref 0.3–1.2)
Total Protein: 6.5 g/dL (ref 6.5–8.1)

## 2019-07-19 LAB — D-DIMER, QUANTITATIVE: D-Dimer, Quant: 18.16 ug/mL-FEU — ABNORMAL HIGH (ref 0.00–0.50)

## 2019-07-19 LAB — EXPECTORATED SPUTUM ASSESSMENT W GRAM STAIN, RFLX TO RESP C

## 2019-07-19 LAB — STREP PNEUMONIAE URINARY ANTIGEN: Strep Pneumo Urinary Antigen: NEGATIVE

## 2019-07-19 LAB — MAGNESIUM: Magnesium: 1.8 mg/dL (ref 1.7–2.4)

## 2019-07-19 LAB — PHOSPHORUS: Phosphorus: 4.4 mg/dL (ref 2.5–4.6)

## 2019-07-19 LAB — C-REACTIVE PROTEIN: CRP: 27.9 mg/dL — ABNORMAL HIGH (ref <1.0)

## 2019-07-19 MED ORDER — FENTANYL CITRATE (PF) 100 MCG/2ML IJ SOLN
INTRAMUSCULAR | Status: AC
  Administered 2019-07-19: 18:00:00 50 ug via INTRAVENOUS
  Filled 2019-07-19: qty 2

## 2019-07-19 MED ORDER — SODIUM BICARBONATE 8.4 % IV SOLN: 200.0000 meq | Freq: Once | INTRAVENOUS | Status: AC

## 2019-07-19 MED ORDER — NOREPINEPHRINE 4 MG/250ML-% IV SOLN
0.0000 ug/min | INTRAVENOUS | Status: DC
  Administered 2019-07-19: 23:00:00 30 ug/min via INTRAVENOUS
  Administered 2019-07-20 (×2): 50 ug/min via INTRAVENOUS
  Administered 2019-07-20: 01:00:00 40 ug/min via INTRAVENOUS
  Administered 2019-07-20 (×2): 45 ug/min via INTRAVENOUS
  Administered 2019-07-20: 06:00:00 50 ug/min via INTRAVENOUS
  Filled 2019-07-19 (×6): qty 250

## 2019-07-19 MED ORDER — NOREPINEPHRINE 4 MG/250ML-% IV SOLN
INTRAVENOUS | Status: AC
  Administered 2019-07-19: 19:00:00 10 ug/min via INTRAVENOUS
  Filled 2019-07-19: qty 250

## 2019-07-19 MED ORDER — ORAL CARE MOUTH RINSE
15.0000 mL | OROMUCOSAL | Status: DC
  Administered 2019-07-19 – 2019-07-21 (×17): 15 mL via OROMUCOSAL

## 2019-07-19 MED ORDER — VECURONIUM BROMIDE 10 MG IV SOLR
0.0000 ug/kg/min | INTRAVENOUS | Status: DC
  Administered 2019-07-20: 03:00:00 0.6 ug/kg/min via INTRAVENOUS
  Administered 2019-07-20: 0.8 ug/kg/min via INTRAVENOUS
  Filled 2019-07-19 (×3): qty 100

## 2019-07-19 MED ORDER — INSULIN ASPART 100 UNIT/ML ~~LOC~~ SOLN
0.0000 [IU] | SUBCUTANEOUS | Status: DC
  Administered 2019-07-19 – 2019-07-20 (×6): 3 [IU] via SUBCUTANEOUS
  Administered 2019-07-21 (×2): 8 [IU] via SUBCUTANEOUS
  Administered 2019-07-21: 12:00:00 11 [IU] via SUBCUTANEOUS
  Administered 2019-07-21: 04:00:00 8 [IU] via SUBCUTANEOUS

## 2019-07-19 MED ORDER — APIXABAN 5 MG PO TABS
5.0000 mg | ORAL_TABLET | Freq: Two times a day (BID) | ORAL | Status: DC
  Filled 2019-07-19 (×2): qty 1

## 2019-07-19 MED ORDER — FENTANYL CITRATE (PF) 100 MCG/2ML IJ SOLN: 25.0000 ug | Freq: Once | INTRAMUSCULAR | Status: AC

## 2019-07-19 MED ORDER — MIDAZOLAM BOLUS VIA INFUSION
1.0000 mg | INTRAVENOUS | Status: DC | PRN
  Filled 2019-07-19: qty 2

## 2019-07-19 MED ORDER — STERILE WATER FOR INJECTION IV SOLN
INTRAVENOUS | Status: DC
  Filled 2019-07-19 (×12): qty 850

## 2019-07-19 MED ORDER — VECURONIUM BOLUS VIA INFUSION
10.0000 mg | Freq: Once | INTRAVENOUS | Status: DC
  Filled 2019-07-19: qty 10

## 2019-07-19 MED ORDER — MIDAZOLAM HCL 2 MG/2ML IJ SOLN
INTRAMUSCULAR | Status: AC
  Administered 2019-07-19: 18:00:00 4 mg
  Filled 2019-07-19: qty 4

## 2019-07-19 MED ORDER — MIDAZOLAM 50MG/50ML (1MG/ML) PREMIX INFUSION
2.0000 mg/h | INTRAVENOUS | Status: DC
  Administered 2019-07-20 (×2): 6 mg/h via INTRAVENOUS
  Administered 2019-07-20: 01:00:00 4 mg/h via INTRAVENOUS
  Administered 2019-07-21: 6 mg/h via INTRAVENOUS
  Filled 2019-07-19 (×5): qty 50

## 2019-07-19 MED ORDER — CHLORHEXIDINE GLUCONATE 0.12% ORAL RINSE (MEDLINE KIT)
15.0000 mL | Freq: Two times a day (BID) | OROMUCOSAL | Status: DC
  Administered 2019-07-19 – 2019-07-20 (×2): 15 mL via OROMUCOSAL

## 2019-07-19 MED ORDER — ROCURONIUM BROMIDE 10 MG/ML (PF) SYRINGE
PREFILLED_SYRINGE | INTRAVENOUS | Status: AC
  Administered 2019-07-19: 18:00:00 100 mg
  Filled 2019-07-19: qty 10

## 2019-07-19 MED ORDER — FENTANYL 2500MCG IN NS 250ML (10MCG/ML) PREMIX INFUSION
0.0000 ug/h | INTRAVENOUS | Status: DC
  Administered 2019-07-20 – 2019-07-21 (×3): 200 ug/h via INTRAVENOUS
  Filled 2019-07-19 (×3): qty 250

## 2019-07-19 MED ORDER — SODIUM CHLORIDE 0.9 % IV SOLN
2.0000 g | Freq: Three times a day (TID) | INTRAVENOUS | Status: DC
  Administered 2019-07-19 – 2019-07-21 (×7): 2 g via INTRAVENOUS
  Filled 2019-07-19 (×8): qty 2

## 2019-07-19 MED ORDER — MIDAZOLAM 50MG/50ML (1MG/ML) PREMIX INFUSION
INTRAVENOUS | Status: AC
  Administered 2019-07-19: 19:00:00 2 mg/h via INTRAVENOUS
  Filled 2019-07-19: qty 50

## 2019-07-19 MED ORDER — ARTIFICIAL TEARS OPHTHALMIC OINT
1.0000 "application " | TOPICAL_OINTMENT | Freq: Three times a day (TID) | OPHTHALMIC | Status: DC
  Administered 2019-07-19 – 2019-07-21 (×7): 1 via OPHTHALMIC
  Filled 2019-07-19 (×4): qty 3.5

## 2019-07-19 MED ORDER — SODIUM BICARBONATE 8.4 % IV SOLN
INTRAVENOUS | Status: AC
  Administered 2019-07-19: 22:00:00 200 meq via INTRAVENOUS
  Filled 2019-07-19: qty 200

## 2019-07-19 MED ORDER — ETOMIDATE 2 MG/ML IV SOLN
INTRAVENOUS | Status: AC
  Administered 2019-07-19: 18:00:00 40 mg
  Filled 2019-07-19: qty 20

## 2019-07-19 MED ORDER — FENTANYL BOLUS VIA INFUSION
25.0000 ug | INTRAVENOUS | Status: DC | PRN
  Filled 2019-07-19: qty 25

## 2019-07-19 MED ORDER — FENTANYL 2500MCG IN NS 250ML (10MCG/ML) PREMIX INFUSION
INTRAVENOUS | Status: AC
  Administered 2019-07-19: 19:00:00 50 ug/h via INTRAVENOUS
  Filled 2019-07-19: qty 250

## 2019-07-19 NOTE — Progress Notes (Signed)
1st unit of blood completed, patient tolerated well with no acute distress noted.

## 2019-07-19 NOTE — Progress Notes (Signed)
Called to patient's room for decreased SpO2.  Upon arrival, patient with SpO2 62% on NRB and 15l HFNC.  ABG obtained; results called to the MD.  Decision made to transfer to the unit.  Upon arrival to the unit, patient placed on [place].  Patient HR dropped, and SpO2 decreased.  Decision made to intubate patient.  Placed on charted on ventilator settings.

## 2019-07-19 NOTE — Procedures (Signed)
Intubation Procedure Note HISHAM JENSON [id number] 1950/08/20  Procedure: Intubation Indications: Respiratory insufficiency  Procedure Details Consent: Unable to obtain consent because of emergent medical necessity. Time Out: Verified patient identification, verified procedure, site/side was marked, verified correct patient position, special equipment/implants available, medications/allergies/relevent history reviewed, required imaging and test results available.  Performed  Maximum sterile technique was used including cap, gloves, gown, hand hygiene and mask.   8.0 ETT placed under direct vis with Mac4 glidescope. Passed easily. Position confirmed by ETCO@ and auscultation. No secretions. No complications    Evaluation Hemodynamic Status: Transient hypertension requiring treatment; O2 sats: low throughout Patient's Current Condition: stable Complications: No apparent complications Patient did tolerate procedure well. Chest X-ray ordered to verify placement.  CXR: pending.   Baltazar Apo, MD, PhD 07/19/2019, 6:19 PM [facility] [phone] or if no answer [phone]

## 2019-07-19 NOTE — Progress Notes (Signed)
Inpatient Rehabilitation Admissions Coordinator  Noted events over last 24 hrs. I will continue to follow and discuss with MD when pt felt medically ready to d/c to CIR. Noted transfused to improve his oxygenation reserve and is on 15 L HFNC. Insurance authorization still pending with [facility] Medicare.  Danne Baxter, RN, MSN Rehab Admissions Coordinator [phone] 07/19/2019 8:32 AM

## 2019-07-19 NOTE — Progress Notes (Signed)
Patient's 02 sat remains in high 70s and low 80s. Blood transfusion was stopped and lasix administered, DR was informed.

## 2019-07-19 NOTE — Progress Notes (Addendum)
PROGRESS NOTE    [name]  [id number] DOB: 1950-07-19 DOA: 07/08/2019 PCP: Aldine Contes, MD   Brief Narrative:  69 yo BM PMHx diabetes type 2 uncontrolled with complication, diabetic peripheral neuropathy, OSA/OHS, morbid obesity, CKD STAGE III, atrial fibrillation, HTN, atrial fibrillation on Eliquis, chronic pain syndrome, anemia, atrial fibrillation on Eliquis and multiple other medical problems   Presented on 12/15 with 1 week of SOB, difficulty sleeping, poor PO intake, and presyncopal symptoms.  He was admitted from the internal medicine clinic.  He's received steroids and remdesivir.  Plan for plasma today.    Subjective: 1/6 afebrile last 24 hours Sleepy but arousable A/O x4, negative CP, negative abdominal pain positive SOB.    Assessment & Plan:   Principal Problem:   COVID-19 with multiple comorbidities Active Problems:   Diabetes type 2, uncontrolled (HCC)   Chronic kidney disease, stage III (moderate) (HCC)   Morbid obesity (HCC)   Hypertension   Paroxysmal atrial fibrillation with RVR (HCC)   Pneumonia due to COVID-19 virus   Acute respiratory failure with hypoxia (HCC)   Chest wall hematoma, left, subsequent encounter   Elevated d-dimer   Atrial fibrillation with RVR ([place])   Diabetes mellitus type 2, uncontrolled, with complications (HCC)   Diabetic neuropathy (HCC)   Obesity hypoventilation syndrome (HCC)   OSA on CPAP   Pressure injury of skin   Acute blood loss anemia   Covid pneumonia/acute respiratory failure with hypoxia COVID-19 Labs  Recent Labs    07/17/19 0847 07/18/19 0640  DDIMER 5.56* 11.09*  FERRITIN 202 203  CRP 16.1* 20.0*    Lab Results  Component Value Date   SARSCOV2NAA POSITIVE (A) 06/20/2019   [place] NEGATIVE 01/24/2019  -Completed course Remdesivir -Complete course Steroids -12/19 transfuse 1 unit Covid convalescent plasma  HCAP -1/6 start 7-day course HCAP pneumonia antibiotics -Trend  procalcitonin -1/6 PCXR multifocal pneumonia RIGHT>> LEFT  ADDENDUM; worsening respiratory status patient will be placed on HHFNC.  Will place in ICU overnight for safety purposes   LEFT Chest Wall Hematoma  -Unable to appreciate on exam secondary to patient's habitus -12/27 repeat CT chest; continues to show hematoma.  However given patient's rising D-dimer and A. fib will start heparin drip, maintain on low side of therapeutic range. Recent Labs  Lab 07/16/19 1030 07/16/19 1920 07/17/19 0847 07/17/19 1622 07/18/19 0640  HGB 7.2* 7.7* 7.2* 7.4* 7.2*  -Stable  Elevated D-dimer -Elevated D-dimer while on Eliquis, he did miss some days while he was sick.  He was transitioned to full dose lovenox.   -LE [place] was negative for DVT and echo was as follows (normal RV systolic function, moderately elevated PASP, low normal EF - see report).  -See A. fib  A. fib with RVR -Currently NSR -Amlodipine 10 mg daily -12/30 increase Metoprolol 37.5 mg QID -12/27 Heparin drip restarted lowest therapeutic dosing per pharmacy -1/6 restart Eliquis; note D-dimer climbing -Clonidine 0.1 mg BID  Essential HTN -See A. fib  Acute on CKD stage III (baseline Cr 1.5) Recent Labs  Lab 07/14/19 0820 07/15/19 1102 07/16/19 0134 07/17/19 0847 07/18/19 0640  CREATININE 1.85* 1.77* 1.80* 1.79* 1.68*  -Continue to hold ACE I -Strict in and out -16.3 L -Daily weight Filed Weights   07/14/19 0637 07/15/19 0352 07/16/19 0500  Weight: (!) 175.5 kg (!) 175.5 kg (!) 175.3 kg  -KVO normal saline  Hypomagnesmia -Magnesium level> 2  Elevated LFT -Acute hepatitis panel negative -Resolved  Diabetes type 2 uncontrolled with complication/Diabetic  Neuropathy -At home on U500 via pump; Per EMR uses 70 units which is~350 units of U100 insulin -12/27 decrease NovoLog 10 units qac -12/28 decrease Lantus 40 units daily -12/28 decrease moderate SSI  -Tradjenta 5 mg daily  [name] -Lipitor 20 mg daily -12/28  LDL= 35  OSA/OHS -On CPAP at home (on hold here) -See Covid pneumonia   Morbid obesity (BMI 46.37 kg/m) -12/27 nutrition consult morbidly obese, uncontrolled diabetes  Insomnia -trazodone prn qhs  Thrombocytopenia: -Resolved  Glaucoma:  -Continue home eye drops  Pressure injury right ear Pressure Injury 07/09/19 Ear Right Stage 2 -  Partial thickness loss of dermis presenting as a shallow open injury with a red, pink wound bed without slough. Red (Active)  07/09/19 0640  Location: Ear  Location Orientation: Right  Staging: Stage 2 -  Partial thickness loss of dermis presenting as a shallow open injury with a red, pink wound bed without slough.  Wound Description (Comments): Red  Present on Admission: No   Goals of care -12/29 consult to CIR for placement; patient must be 20 days out from most recent positive Covid test to be accepted.  (January 4 should be eligible) -1/1 there will be no beds available at [facility] until Monday.    DVT prophylaxis: Heparin drip Code Status: Full Family Communication: 07/19/2019 spoke with Early (wife) explained plan of care answered all questions Disposition Plan: TBD   Consultants:  CIR    Procedures/Significant Events:  12/19 Echo;LVEF=50 to 55%.-negative regional wall abnormality..  -Moderately elevated pulmonary artery systolic pressure. 12/20 bilateral lower extremity Doppler; negative for DVT/SVT 12/24 CT chest Wo contrast;-patchy BILATERAL airspace infiltrates consistent with multifocal pneumonia and COVID-19. -Dffuse edema and enlargement of the LEFT pectoralis muscles especially pectoralis minor with surrounding infiltrative changes extending into the LEFT lateral chest wall. Favor the above changes being related to chest wall hemorrhage/hematoma though unable to exclude infection by CT. -Aortic Atherosclerosis 12/27 CT chest Wo contrast;-interval decrease in size of the left pectoralis minor muscle suggesting resolving  edema/hemorrhage. No discrete hematoma at this time. -Persistent enlargement of the left pectoralis major muscle with edema/hemorrhage tracking in the subcutaneous fat of the anterior left chest wall, similar to prior. -Similar appearance of the widespread patchy airspace disease compatible with multifocal pneumonia. -Hepatic steatosis -1/3 transfuse 2 units packed PRBC -1/6 PCXR; multifocal pneumonia with worsening lung aeration      I have personally reviewed and interpreted all radiology studies and my findings are as above.  VENTILATOR SETTINGS: HFNC+ NRB 1/6  flow; 15 L/min SPO2; 88%    Cultures 12/15 SARS coronavirus positive 12/15 HIV screen negative 12/20 acute hepatitis panel negative    Antimicrobials: Anti-infectives (From admission, onward)   Start     Dose/Rate Stop   06/28/19 2100  remdesivir 100 mg in sodium chloride 0.9 % 100 mL IVPB     100 mg 200 mL/hr over 30 Minutes 07/01/19 2107   06/26/2019 2100  remdesivir 200 mg in sodium chloride 0.9% 250 mL IVPB     200 mg 580 mL/hr over 30 Minutes 06/28/19 0823       Devices    LINES / TUBES:      Continuous Infusions: . sodium chloride Stopped (07/01/19 0355)  . sodium chloride 10 mL/hr at 07/14/19 1157     Objective: Vitals:   07/19/19 0340 07/19/19 0415 07/19/19 0615 07/19/19 0645  BP: (!) 142/68 (!) 143/75 127/62   Pulse: 90 89 92 89  Resp: 15 17 20  Temp: 98.5 F (36.9 C) 98.6 F (37 C) 98.7 F (37.1 C)   TempSrc: Oral Oral Oral   SpO2: (!) 89% (!) 88% (!) 82% (!) 88%  Weight:      Height:        Intake/Output Summary (Last 24 hours) at 07/19/2019 0751 Last data filed at 07/19/2019 0600 Gross per 24 hour  Intake 480 ml  Output 2000 ml  Net -1520 ml   Filed Weights   07/14/19 0637 07/15/19 0352 07/16/19 0500  Weight: (!) 175.5 kg (!) 175.5 kg (!) 175.3 kg   Physical Exam:  General: A/O x4, increased positive acute respiratory distress Eyes: negative scleral hemorrhage,  negative anisocoria, negative icterus ENT: Negative Runny nose, negative gingival bleeding, Neck:  Negative scars, masses, torticollis, lymphadenopathy, JVD Lungs: Tachypneic decreased breath sounds bilaterally without wheezes or crackles Cardiovascular: Regular rate and rhythm without murmur gallop or rub normal S1 and S2 Abdomen: MORBIDLY OBESE negative abdominal pain, nondistended, positive soft, bowel sounds, no rebound, no ascites, no appreciable mass Extremities: No significant cyanosis, clubbing, or edema bilateral lower extremities Skin: Negative rashes, lesions, ulcers Psychiatric:  Negative depression, negative anxiety, negative fatigue, negative mania  Central nervous system:  Cranial nerves II through XII intact, tongue/uvula midline, all extremities muscle strength 5/5, sensation intact throughout, negative dysarthria, negative expressive aphasia, negative receptive aphasia.       Data Reviewed: Care during the described time interval was provided by me .  I have reviewed this patient's available data, including medical history, events of note, physical examination, and all test results as part of my evaluation.   CBC: Recent Labs  Lab 07/14/19 0820 07/15/19 1102 07/16/19 0134 07/16/19 1030 07/16/19 1920 07/17/19 0847 07/17/19 1622 07/18/19 0640  WBC 7.2 7.4 8.2 8.2  --  7.9  --  8.3  NEUTROABS 5.6 5.8 6.4  --   --  6.1  --  6.3  HGB 7.7* 7.9* 6.6* 7.2* 7.7* 7.2* 7.4* 7.2*  HCT 25.6* 26.3* 22.0* 24.0* 24.7* 24.2* 24.3* 24.2*  MCV 94.8 94.9 94.8 92.3  --  92.7  --  92.7  PLT 173 168 163 161  --  172  --  123456*   Basic Metabolic Panel: Recent Labs  Lab 07/14/19 0820 07/15/19 0711 07/15/19 1102 07/16/19 0134 07/17/19 0847 07/18/19 0640  NA 138  --  139 138 138 139  K 4.8  --  4.5 5.1 4.2 4.1  CL 106  --  107 109 108 107  CO2 24  --  24 22 23  21*  GLUCOSE 103*  --  105* 140* 118* 106*  BUN 24*  --  24* 35* 29* 29*  CREATININE 1.85*  --  1.77* 1.80* 1.79*  1.68*  CALCIUM 8.2*  --  8.1* 8.1* 8.1* 8.0*  MG 1.9 1.9 1.8 1.9 1.8 1.7  PHOS 3.9 3.2 3.0 3.6 3.3 3.5   GFR: Estimated Creatinine Clearance: 73.4 mL/min (A) (by C-G formula based on SCr of 1.68 mg/dL (H)). Liver Function Tests: Recent Labs  Lab 07/14/19 0820 07/15/19 1102 07/16/19 0134 07/17/19 0847 07/18/19 0640  AST 25 23 21 21 23   ALT 32 27 26 23 21   ALKPHOS 87 99 102 100 95  BILITOT 0.9 0.8 1.0 0.9 1.0  PROT 5.7* 5.8* 5.4* 6.0* 6.0*  ALBUMIN 2.0* 2.1* 2.1* 2.1* 2.2*   No results for input(s): LIPASE, AMYLASE in the last 168 hours. No results for input(s): AMMONIA in the last 168 hours. Coagulation Profile: Recent Labs  Lab 07/16/19 1030  INR 1.1   Cardiac Enzymes: No results for input(s): CKTOTAL, CKMB, CKMBINDEX, TROPONINI in the last 168 hours. BNP (last 3 results) No results for input(s): PROBNP in the last 8760 hours. HbA1C: No results for input(s): HGBA1C in the last 72 hours. CBG: Recent Labs  Lab 07/18/19 1532 07/18/19 1950 07/19/19 0030 07/19/19 0305 07/19/19 0726  GLUCAP 132* 110* 88 110* 179*   Lipid Profile: No results for input(s): CHOL, HDL, LDLCALC, TRIG, CHOLHDL, LDLDIRECT in the last 72 hours. Thyroid Function Tests: No results for input(s): TSH, T4TOTAL, FREET4, T3FREE, THYROIDAB in the last 72 hours. Anemia Panel: Recent Labs    07/17/19 0847 07/18/19 0640  FERRITIN 202 203   Urine analysis:    Component Value Date/Time   COLORURINE YELLOW 05/17/2015 1025   APPEARANCEUR CLEAR 05/17/2015 1025   LABSPEC >=1.030 (A) 05/17/2015 1025   PHURINE 5.5 05/17/2015 1025   GLUCOSEU NEGATIVE 05/17/2015 1025   HGBUR MODERATE (A) 05/17/2015 1025   BILIRUBINUR NEGATIVE 05/17/2015 1025   KETONESUR NEGATIVE 05/17/2015 1025   PROTEINUR NEGATIVE 12/13/2014 2120   UROBILINOGEN 0.2 05/17/2015 1025   NITRITE NEGATIVE 05/17/2015 1025   LEUKOCYTESUR NEGATIVE 05/17/2015 1025   Sepsis Labs: @LABRCNTIP (procalcitonin:4,lacticidven:4)  )No results  found for this or any previous visit (from the past 240 hour(s)).       Radiology Studies: No results found.      Scheduled Meds: . sodium chloride   Intravenous Once  . amLODipine  10 mg Oral Daily  . atorvastatin  20 mg Oral q1800  . Chlorhexidine Gluconate Cloth  6 each Topical Daily  . cloNIDine  0.1 mg Oral BID  . cycloSPORINE  1 drop Both Eyes Daily  . gabapentin  300 mg Oral QHS  . insulin aspart  0-15 Units Subcutaneous Q4H  . insulin aspart  10 Units Subcutaneous TID WC  . insulin glargine  40 Units Subcutaneous Daily  . latanoprost  1 drop Both Eyes QHS  . linagliptin  5 mg Oral Daily  . mouth rinse  15 mL Mouth Rinse BID  . metoprolol tartrate  37.5 mg Oral Q6H  . pantoprazole  40 mg Oral Daily  . sodium chloride flush  3 mL Intravenous Q12H   Continuous Infusions: . sodium chloride Stopped (07/01/19 0355)  . sodium chloride 10 mL/hr at 07/14/19 1157     LOS: 22 days   The patient is critically ill with multiple organ systems failure and requires high complexity decision making for assessment and support, frequent evaluation and titration of therapies, application of advanced monitoring technologies and extensive interpretation of multiple databases. Critical Care Time devoted to patient care services described in this note  Time spent: 40 minutes     Vonita Calloway, Geraldo Docker, MD [facility] Pager [phone]  If 7PM-7AM, please contact night-coverage [email or web address] Password [facility] 07/19/2019, 7:51 AM

## 2019-07-19 NOTE — Progress Notes (Signed)
Pt received in ICU around 1720 with RN and RT at bedside. Pt in obvious resp distress  With labored breathing and restlessness. Pt placed on cardiac monitor, sats in 50's. Pt alert and get out some words. Dr. Lamonte Sakai called to bedside emergently. Pt had brief episode of sudden change in LOC and depressed respirations. After bout 1 minute pt did become arousable again and begin breathing. When at bedside Dr. Lamonte Sakai placed ETT and pt placed on vent by RT. VS treated with ordered medication, see flowsheet and MAR. Central line placed at bedside by Dr. Lamonte Sakai without issue.

## 2019-07-19 NOTE — Progress Notes (Signed)
1st unit of blood transfusion has started, patient is tolerating it well, will continue to monitor.

## 2019-07-19 NOTE — Progress Notes (Addendum)
PT Cancellation Note  Patient Details Name: [name] MRN: [id number] DOB: 02-May-1951   Cancelled Treatment:    Reason Eval/Treat Not Completed: Patient declined, no reason specified(Per RN, we are to hold treatment today as she is not appropriate due to medical decline) [name], PT # [phone] CGV cell   [name] 07/19/2019, 10:47 AM

## 2019-07-19 NOTE — Progress Notes (Signed)
Late entry: Patient transferred to ICU after patient continued to have desaturations throughout the day from 70s-85% but arousable and mental status stable. Patient however, continued to desat after lunch and observed using IS and flutter valve trying to increase his O2 sat and O2 sat found to be 66% on 15L HFNC and 15L NRB. Patient asked to stop using and continue to take in slow breaths. Around 1500 RN noted O2 sats dropping to 60s-70s and patient's lethargy was worse, wheezing noted. RT, CN, and MD about concerns with patient. Wife and MD had been updated earlier today. Wife after conversation with RT and RN was asked about code status and she stated she wants everything done to keep my husband alive. Sister of patient also notified. RT and RN transferred patient to ICU.

## 2019-07-19 NOTE — Progress Notes (Signed)
2nd unit of blood started, patient is tolerating well, will continue to monitor.

## 2019-07-19 NOTE — Progress Notes (Signed)
Pharmacy Antibiotic Note  [name] is a 69 y.o. male admitted on 06/19/2019 with COVID.  Pharmacy has been consulted for cefepime dosing for new HCAP.  Plan: Cefepime 2gm IV q8h Monitor for clinical progress, LOT, and deescalation  Height: 6\' 6"  (198.1 cm) Weight: (!) 386 lb 7.5 oz (175.3 kg) IBW/kg (Calculated) : 91.4  Temp (24hrs), Avg:98.6 F (37 C), Min:98.1 F (36.7 C), Max:99 F (37.2 C)  Recent Labs  Lab 07/14/19 0820 07/15/19 1102 07/16/19 0134 07/16/19 1030 07/17/19 0847 07/18/19 0640  WBC 7.2 7.4 8.2 8.2 7.9 8.3  CREATININE 1.85* 1.77* 1.80*  --  1.79* 1.68*    Estimated Creatinine Clearance: 73.4 mL/min (A) (by C-G formula based on SCr of 1.68 mg/dL (H)).    Allergies  Allergen Reactions  . Vancomycin     REACTION: ARF    Malky Rudzinski A. Levada Dy, PharmD, BCPS, FNKF Clinical Pharmacist  Please utilize Amion for appropriate phone number to reach the unit pharmacist ([place])    07/19/2019 8:14 AM

## 2019-07-19 NOTE — Progress Notes (Signed)
Late entry for 07/18/19 @1630 . Patient requested to go back to bed after being left in the recliner by PT/OT. Three RNs went in to bath patient with CHG wipes and change wound dressings. Patient's O2 sats dropped to 60% with good pleth wave form just sitting leaning forward. O2 increased to 15L HFNC (had been gradually increased during the day due to SOB/DOE) then 15L NRB added to get sats up to 78% MD and RT notified. Patient was very SOB/DOE and finally when sats were 78% able to stand patient with assistance of the 3 nurses and change dressings and perform bath (while sitting) and quickly put to bed. O2 Sat remained in 80s and patient very lethargic and weak. MD,  RT, CN, and ICU notified to watch patient. Two RNs remained with patient for 1 hour during crisis. Oncoming nurse notified of condition and change.

## 2019-07-19 NOTE — Progress Notes (Addendum)
eLink Physician-Brief Progress Note Patient Name: MONTERRIUS HAFLER DOB: [birth year] MRN: [id number]   Date of Service  07/19/2019  HPI/Events of Note  Multiple issues: 1. Hypotension - BP = 76/25 and 2. ABG on 90%/PRVC 30/TV 5404/P 18 = 7.034/88.0/86.0.  eICU Interventions  Will order: 1. NaHCO3 200 meq IV now.  2. NaHCO3 IV infusion at 125 mL/hour. 3. Increase ceiling on Norepinephrine IV infusion to 70 mcg/min, 4. Increase TV to 640 and decrease P to 16. 5. Repeat ABG at 12:00 AM.     Intervention Category Major Interventions: Hypotension - evaluation and management;Acid-Base disturbance - evaluation and management;Respiratory failure - evaluation and management  Dakiyah Heinke [name] 07/19/2019, 11:01 PM

## 2019-07-19 NOTE — Progress Notes (Signed)
Pharmacy - Eliquis   For Afib Age < 80, Weight > 60 kg, Scr 1.92  Plan: Eliquis 5 mg po BID  Thank you Anette Guarneri, PharmD

## 2019-07-19 NOTE — Progress Notes (Addendum)
Inpatient Rehabilitation Admissions Coordinator  Discussed with Dr. Sherral Hammers. I will withdraw my request for [facility] insurance request for patient not felt medically ready to admit to CIR. Peer to peer will not be pursued at this time.  Danne Baxter, RN, MSN Rehab Admissions Coordinator [phone] 07/19/2019 1:41 PM

## 2019-07-19 NOTE — Procedures (Signed)
Central Venous Catheter Insertion Procedure Note KAYSTON BURKART [id number] July 10, 1951  Procedure: Insertion of Central Venous Catheter Indications: Drug and/or fluid administration  Procedure Details Consent: Unable to obtain consent because of emergent medical necessity. Time Out: Verified patient identification, verified procedure, site/side was marked, verified correct patient position, special equipment/implants available, medications/allergies/relevent history reviewed, required imaging and test results available.  Performed  Maximum sterile technique was used including antiseptics, cap, gloves, gown, hand hygiene, mask and sheet. Skin prep: Chlorhexidine; local anesthetic administered A antimicrobial bonded/coated triple lumen catheter was placed in the right internal jugular vein using the Seldinger technique.  Evaluation Blood flow good Complications: No apparent complications Patient did tolerate procedure well. Chest X-ray ordered to verify placement.  CXR: pending.   Baltazar Apo, MD, PhD 07/19/2019, 6:20 PM [facility] [phone] or if no answer [phone]

## 2019-07-19 NOTE — Progress Notes (Signed)
Patient's 02 sats in 88

## 2019-07-19 NOTE — Consult Note (Signed)
NAME:  [name], MRN:  [id number], DOB:  1950-12-28, LOS: 54 ADMISSION DATE:  06/13/2019, CONSULTATION DATE:  06/28/19 REFERRING MD:  Rebeca Alert  CHIEF COMPLAINT:  Hypoxia   Brief History   [name] is a 69 y.o. male who was admitted 12/15 with COVID PNA.  PCCM asked to see 12/16 due to increasing O2 requirements.  History of present illness   [name] is a 69 y.o. male who has a PMH including but not limited to DM, HTN, [name], morbid obesity, OSA on CPAP, CKD  He presented to IM clinic 12/15 with progressive dyspnea and fatigue x 1 week.  Symptoms gradually worsened to the point he had to seek medical attention.  At IM clinic, he was found to be hypoxic with SpO2 85% on RA.  He was admitted and was later found to be COVID positive.  He was started on remdesivir and decadron.  He is on chronic apixaban which was continued.  He received convalescent plasma 12/19  Has experienced progressive hypoxemia.  Chest x-ray done on 1/6 reviewed, showed progressive multifocal pneumonia with significantly worsening aeration compared with 12/22 film and CT chest done 12/27.  Past Medical History  has Diabetes type 2, uncontrolled ([place]); Obstructive sleep apnea; Diabetic peripheral neuropathy associated with type 2 diabetes mellitus ([place]); DIABETIC MACULAR EDEMA; HEARING LOSS, SENSORINEURAL, BILATERAL; GERD; Chronic kidney disease, stage III (moderate) ([place]); Morbid obesity ([place]); STATUS, OTHER TOE(S) AMPUTATION; Hypertension; Anemia; Vitamin D deficiency; Paroxysmal atrial fibrillation with RVR ([place]); Preventative health care; Chronic pain syndrome; Nausea; Current moderate episode of major depressive disorder ([place]); Dyspnea; Hypoxia; Diarrhea; Respiratory failure with hypoxia ([place]); Postural dizziness with presyncope; COVID-19 with multiple comorbidities; Pneumonia due to COVID-19 virus; Acute respiratory failure with hypoxia ([place]); Chest wall hematoma, left, subsequent encounter;  Elevated d-dimer; Atrial fibrillation with RVR ([place]); Diabetes mellitus type 2, uncontrolled, with complications ([place]); Diabetic neuropathy ([place]); Obesity hypoventilation syndrome ([place]); OSA on CPAP; Pressure injury of skin; and Acute blood loss anemia on their problem list.  Significant Hospital Events   12/15 > admit 1/6 >> transfer to ICU  Consults:  PCCM.  Procedures:  ETT 1/6 >>  Right IJ CVC 1/6 >>   Significant Diagnostic Tests:  CXR 12/15 > extensive b/l airspace disease with vascular congestion. CT chest 12/24 and 12/27 >> both show extensive bilateral patchy airspace disease, enlarged pectoralis major on the left with area of edema/hemorrhage and hematoma  Micro Data:  Flu 12/15 > neg. SARS CoV 2 12/15 > pos. Respiratory 1/6 >>  Blood 1/6 >>   Antimicrobials:  Cefepime 1/6 >>   Interim history/subjective:  Patient decompensated even after placement on heated high flow +1.00 mask.  Moved urgently to the ICU with SPO2 in the 50s.  Objective:  Blood pressure 129/63, pulse (!) 120, temperature 98.5 F (36.9 C), temperature source Oral, resp. rate (!) 30, height _0  (1.981 m), weight (!) 175.3 kg, SpO2 (!) 76 %.    Vent Mode: PRVC FiO2 (%):  [100 %] 100 % Set Rate:  [30 bmp] 30 bmp Vt Set:  [540 mL] 540 mL PEEP:  [16 cmH20] 16 cmH20 Plateau Pressure:  [31 cmH20] 31 cmH20   Intake/Output Summary (Last 24 hours) at 07/19/2019 1837 Last data filed at 07/19/2019 1500 Gross per 24 hour  Intake 460 ml  Output 1200 ml  Net -740 ml   Filed Weights   07/14/19 0637 07/15/19 0352 07/16/19 0500  Weight: (!) 175.5 kg (!) 175.5 kg Marland Kitchen)  175.3 kg    Examination: General: Morbidly obese man, uncomfortable when severe respiratory distress Neuro: He does wake, respond to questions, moves all extremities HEENT: Large tongue, edentulous, oropharynx clear, no stridor Cardiovascular: Tachycardic, distant, regular Lungs: Coarse bilaterally, inspiratory crackles Abdomen: Morbidly  obese, ventral hernia, no bowel sounds heard (but difficult auscultation) Musculoskeletal: No edema, left pectoral hematoma not palpable Skin: I no apparent rash  Assessment & Plan:  Acute hypoxemic respiratory failure due to COVID-19 pneumonia, now with ARDS and possible superimposed HCAP.  Plan: Mechanical ventilation via ARDS protocol, target PRVC 6 cc/kg Wean PEEP and FiO2 as able, recruitment maneuver now, 1.00+ PEEP 16 for initial settings Goal plateau pressure less than 30, driving pressure less than 15 Paralytics, initiate now for vent synchrony and given severe gas exchange defect Cycle prone positioning if necessary for oxygenation.  Assess follow-up ABG and decide Deep sedation per PAD protocol, goal RASS -4, currently fentanyl, propofol infusions Diuresis as blood pressure and renal function can tolerate, goal CVP 5-8 VAP prevention order set Agree with empiric cefepime, follow culture data  Atrial fibrillation and hypertension On Eliquis, consider transition to heparin infusion On amlodipine, metoprolol, clonidine.  May need to stop these depending on his hemodynamics once he is on adequate sedation  Diabetes type 2 Insulin plans as per [facility] Consider stop Tradjenta since he is n.p.o.  Toxic metabolic encephalopathy, need for sedation for mechanical ventilation PAD protocol ordered, fentanyl and Versed infusions    Best Practice:  Diet: N.p.o. DVT prophylaxis: Therapeutic Eliquis GI prophylaxis: N/A. Glucose control: Lantus 40, SSI Mobility: Bedrest Code Status: Full code Family Communication: Per primary service Disposition: ICU  Labs   CBC: Recent Labs  Lab 07/15/19 1102 07/16/19 0134 07/16/19 1030 07/17/19 0847 07/17/19 1622 07/18/19 0640 07/19/19 0945 07/19/19 1640  WBC 7.4 8.2 8.2 7.9  --  8.3 9.9  --   NEUTROABS 5.8 6.4  --  6.1  --  6.3 8.2*  --   HGB 7.9* 6.6* 7.2* 7.2* 7.4* 7.2* 8.9* 9.9*  HCT 26.3* 22.0* 24.0* 24.2* 24.3* 24.2* 28.7* 29.0*    MCV 94.9 94.8 92.3 92.7  --  92.7 91.4  --   PLT 168 163 161 172  --  148* 130*  --    Basic Metabolic Panel: Recent Labs  Lab 07/15/19 1102 07/16/19 0134 07/17/19 0847 07/18/19 0640 07/19/19 0945 07/19/19 1640  NA 139 138 138 139 140 142  K 4.5 5.1 4.2 4.1 4.2 4.5  CL 107 109 108 107 108  --   CO2 _0 21* 20*  --   GLUCOSE 105* 140* 118* 106* 181*  --   BUN 24* 35* 29* 29* 31*  --   CREATININE 1.77* 1.80* 1.79* 1.68* 1.92*  --   CALCIUM 8.1* 8.1* 8.1* 8.0* 8.2*  --   MG 1.8 1.9 1.8 1.7 1.8  --   PHOS 3.0 3.6 3.3 3.5 4.4  --    GFR: Estimated Creatinine Clearance: 64.2 mL/min (A) (by C-G formula based on SCr of 1.92 mg/dL (H)). Recent Labs  Lab 07/16/19 1030 07/17/19 0847 07/18/19 0640 07/19/19 0945  PROCALCITON  --   --  0.61  --   WBC 8.2 7.9 8.3 9.9   Liver Function Tests: Recent Labs  Lab 07/15/19 1102 07/16/19 0134 07/17/19 0847 07/18/19 0640 07/19/19 0945  AST _1 ALT _2 ALKPHOS 99 102 100 95 135*  BILITOT 0.8 1.0 0.9 1.0  2.1*  PROT 5.8* 5.4* 6.0* 6.0* 6.5  ALBUMIN 2.1* 2.1* 2.1* 2.2* 2.2*   No results for input(s): LIPASE, AMYLASE in the last 168 hours. No results for input(s): AMMONIA in the last 168 hours. ABG    Component Value Date/Time   PHART 7.386 07/19/2019 1640   PCO2ART 35.9 07/19/2019 1640   PO2ART 33.0 (LL) 07/19/2019 1640   HCO3 21.5 07/19/2019 1640   TCO2 23 07/19/2019 1640   ACIDBASEDEF 3.0 (H) 07/19/2019 1640   O2SAT 63.0 07/19/2019 1640    Coagulation Profile: Recent Labs  Lab 07/16/19 1030  INR 1.1   Cardiac Enzymes: No results for input(s): CKTOTAL, CKMB, CKMBINDEX, TROPONINI in the last 168 hours. HbA1C: Hemoglobin A1C  Date/Time Value Ref Range Status  06/06/2019 10:46 AM 8.2 (A) 4.0 - 5.6 % Final   Hgb A1c MFr Bld  Date/Time Value Ref Range Status  02/28/2019 10:10 AM 9.8 (H) 4.6 - 6.5 % Final    Comment:    Glycemic Control Guidelines for People with Diabetes:Non Diabetic:   <6%Goal of Therapy: <7%Additional Action Suggested:  >8%   11/14/2018 09:54 AM 10.5 (H) 4.6 - 6.5 % Final    Comment:    Glycemic Control Guidelines for People with Diabetes:Non Diabetic:  <6%Goal of Therapy: <7%Additional Action Suggested:  >8%    CBG: Recent Labs  Lab 07/19/19 0030 07/19/19 0305 07/19/19 0726 07/19/19 1123 07/19/19 1603  GLUCAP 88 110* 179* 158* 119*    Review of Systems:   Patient unable to give due to respiratory distress  Past medical history  He,  has a past medical history of Arthritis, Asthma, CKD (chronic kidney disease), stage III, DIABETIC FOOT ULCER (06/20/2009), Edema, macular, due to secondary diabetes ([place]), Erectile dysfunction, GERD (gastroesophageal reflux disease), HEARING LOSS, SENSORINEURAL, BILATERAL (01/03/2007), Hemorrhoids, History of echocardiogram, Hyperlipidemia, Hypertension, Morbid obesity ([place]), Neuropathy, lower extremity, OSA (obstructive sleep apnea), OSA on CPAP, Osteomyelitis of ankle and foot ([place]), Retinopathy, and Type II diabetes mellitus ([place]).   Surgical History    Past Surgical History:  Procedure Laterality Date  . ACHILLES TENDON LENGTHENING Left 03/21/2018   Procedure: ACHILLES TENDON LENGTHENING;  Surgeon: Erle Crocker, MD;  Location: [facility];  Service: Orthopedics;  Laterality: Left;  . AMPUTATION Left 12/15/2014   Procedure: LEFT SECOND TOE AMPUTATION ;  Surgeon: Dorna Leitz, MD;  Location: [facility];  Service: Orthopedics;  Laterality: Left;  . AMPUTATION Left 03/21/2018   Procedure: TRANSMETATARSAL AMPUTATION LEFT FOOT;  Surgeon: Erle Crocker, MD;  Location: [facility];  Service: Orthopedics;  Laterality: Left;  Popliteal & ankle block  . COLONOSCOPY WITH PROPOFOL N/A 01/27/2019   Procedure: COLONOSCOPY WITH PROPOFOL;  Surgeon: Carol Ada, MD;  Location: [facility];  Service: Endoscopy;  Laterality: N/A;  . POLYPECTOMY  01/27/2019   Procedure: POLYPECTOMY;  Surgeon: Carol Ada, MD;  Location: [facility];   Service: Endoscopy;;  . 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 Kathalene Frames. Mayer Camel.  . WOUND DEBRIDEMENT Left 06/15/2016   Procedure: DEBRIDEMENT WOUND LEFT FOOT WITH GRAFT APPLICATION;  Surgeon: Landis Martins, DPM;  Location: [facility];  Service: Podiatry;  Laterality: Left;     Social History   reports that he quit smoking about 9 years ago. His smoking use included cigarettes. He has a 15.00 pack-year smoking history. He has never used smokeless tobacco. He reports current alcohol use of about 11.0 standard drinks of alcohol per  week. He reports that he does not use drugs.   Family history   His family history includes Diabetes in his mother; Heart attack (age of onset: 15) in his father; Throat cancer in his brother.   Allergies Allergies  Allergen Reactions  . Vancomycin     REACTION: ARF     Home meds  Prior to Admission medications   Medication Sig Start Date End Date Taking? Authorizing Provider  albuterol (PROAIR HFA) 108 (90 Base) MCG/ACT inhaler Inhale 2 puffs into the lungs every 6 (six) hours as needed for wheezing or shortness of breath. 08/23/18  Yes Aldine Contes, MD  AMITIZA 24 MCG capsule Take 24 mcg by mouth 2 (two) times daily. 06/13/19  Yes [provider]  apixaban (ELIQUIS) 5 MG TABS tablet Take 1 tablet (5 mg total) by mouth 2 (two) times daily. 03/07/19  Yes Aldine Contes, MD  atorvastatin (LIPITOR) 20 MG tablet TAKE 1 TABLET(20 MG) BY MOUTH DAILY Patient taking differently: Take 20 mg by mouth daily.  02/10/19  Yes Aldine Contes, MD  Cholecalciferol (VITAMIN D3) 50 MCG (2000 UT) TABS Take 2,000 Units by mouth daily.   Yes [provider]  enalapril (VASOTEC) 20 MG tablet TAKE 2 TABLETS(40 MG) BY MOUTH DAILY Patient taking differently: Take 40 mg by mouth daily.  06/12/19  Yes Oda Kilts, MD  furosemide (LASIX) 20 MG tablet Take 1  tablet (20 mg total) by mouth daily. 04/05/19  Yes Aldine Contes, MD  gabapentin (NEURONTIN) 300 MG capsule TAKE 1 CAPSULE(300 MG) BY MOUTH AT BEDTIME Patient taking differently: Take 300 mg by mouth at bedtime.  06/12/19  Yes Oda Kilts, MD  HUMULIN R 500 UNIT/ML injection DRAW INSULIN TO THE 68 UNIT LINE ON THE U-100 SYRINGE(TO USE 340 UNITS) PER DAY AS DIRECTED Patient taking differently: Inject into the skin See admin instructions. Draw insulin to the 68 units line on the U-100 Syringe(to use 340 units) per day as directed. 06/07/19  Yes Elayne Snare, MD  HYDROcodone-acetaminophen (NORCO) 7.5-325 MG tablet Take 1 tablet by mouth every 8 (eight) hours as needed for moderate pain or severe pain. 03/23/18  Yes Kayleen Memos, DO  hydroxypropyl methylcellulose / hypromellose (ISOPTO TEARS / GONIOVISC) 2.5 % ophthalmic solution Place 1-2 drops into both eyes 3 (three) times daily as needed for dry eyes (dry/irritated eyes.).   Yes [provider]  LUMIGAN 0.01 % SOLN Place 1 drop into both eyes at bedtime. 06/02/19  Yes [provider]  metoprolol succinate (TOPROL XL) 25 MG 24 hr tablet Take 1 tablet (25 mg total) by mouth daily. 03/07/19 03/06/20 Yes Aldine Contes, MD  promethazine (PHENERGAN) 25 MG tablet TAKE 1 TABLET(25 MG) BY MOUTH EVERY 8 HOURS AS NEEDED FOR NAUSEA OR VOMITING Patient taking differently: Take 25 mg by mouth every 8 (eight) hours as needed for nausea or vomiting.  06/12/19  Yes Oda Kilts, MD  RESTASIS 0.05 % ophthalmic emulsion Place 1 drop into both eyes daily. 01/13/19  Yes [provider]  SIMBRINZA 1-0.2 % SUSP Place 1 drop into both eyes 2 (two) times daily.  10/27/12  Yes [provider]  VICTOZA 18 MG/3ML SOPN ADMINISTER 1.8 MG UNDER THE SKIN AT [place] Patient taking differently: Inject 1.8 mg into the skin daily.  05/23/19  Yes Elayne Snare, MD  ACCU-CHEK FASTCLIX LANCETS MISC Check blood sugar 5 times  a day 06/10/18   Welford Roche, MD  Blood Glucose Monitoring  Suppl (ACCU-CHEK AVIVA PLUS) w/Device KIT CHECK BLOOD SUGAR 5 TIMES A DAY 06/10/18   Santos-Sanchez, Merlene Morse, MD  Continuous Blood Gluc Sensor (FREESTYLE LIBRE 14 DAY SENSOR) MISC 1 each by Does not apply route 6 (six) times daily. 12/19/18   Aldine Contes, MD  glucose blood (ACCU-CHEK AVIVA PLUS) test strip USE TO CHECK BLOOD FIVE TIMES DAILY 06/10/18   Welford Roche, MD  glucose blood (FREESTYLE PRECISION NEO TEST) test strip 1 each by Other route as needed for other. Use as instructed to check blood sugar 2 times daily. 06/08/18   Elayne Snare, MD  Insulin Disposable Pump (V-GO 20) KIT USE DAILY WITH INSULIN 04/10/19   Elayne Snare, MD  INSULIN SYRINGE 1CC/29G 29G X 1/2" 1 ML MISC USE DAILY AS DIRECTED 02/13/19   Elayne Snare, MD    Independent critical care time 60 minutes  Baltazar Apo, MD, PhD 07/19/2019, 6:54 PM [facility] [phone] or if no answer [phone]

## 2019-07-19 NOTE — Plan of Care (Signed)

## 2019-07-20 ENCOUNTER — Inpatient Hospital Stay (HOSPITAL_COMMUNITY): Payer: Medicare Other

## 2019-07-20 DIAGNOSIS — I4891 Unspecified atrial fibrillation: Secondary | ICD-10-CM

## 2019-07-20 DIAGNOSIS — R0600 Dyspnea, unspecified: Secondary | ICD-10-CM

## 2019-07-20 LAB — CBC
HCT: 27.5 % — ABNORMAL LOW (ref 39.0–52.0)
Hemoglobin: 8.3 g/dL — ABNORMAL LOW (ref 13.0–17.0)
MCH: 28.4 pg (ref 26.0–34.0)
MCHC: 30.2 g/dL (ref 30.0–36.0)
MCV: 94.2 fL (ref 80.0–100.0)
Platelets: 142 10*3/uL — ABNORMAL LOW (ref 150–400)
RBC: 2.92 MIL/uL — ABNORMAL LOW (ref 4.22–5.81)
RDW: 17.3 % — ABNORMAL HIGH (ref 11.5–15.5)
WBC: 10.6 10*3/uL — ABNORMAL HIGH (ref 4.0–10.5)
nRBC: 4.7 % — ABNORMAL HIGH (ref 0.0–0.2)

## 2019-07-20 LAB — MAGNESIUM
Magnesium: 1.9 mg/dL (ref 1.7–2.4)
Magnesium: 2.9 mg/dL — ABNORMAL HIGH (ref 1.7–2.4)

## 2019-07-20 LAB — POCT I-STAT 7, (LYTES, BLD GAS, ICA,H+H)
Acid-Base Excess: 2 mmol/L (ref 0.0–2.0)
Acid-base deficit: 4 mmol/L — ABNORMAL HIGH (ref 0.0–2.0)
Acid-base deficit: 1 mmol/L (ref 0.0–2.0)
Bicarbonate: 23.7 mmol/L (ref 20.0–28.0)
Bicarbonate: 27.2 mmol/L (ref 20.0–28.0)
Bicarbonate: 29.5 mmol/L — ABNORMAL HIGH (ref 20.0–28.0)
Calcium, Ion: 1.11 mmol/L — ABNORMAL LOW (ref 1.15–1.40)
Calcium, Ion: 1.08 mmol/L — ABNORMAL LOW (ref 1.15–1.40)
Calcium, Ion: 1.01 mmol/L — ABNORMAL LOW (ref 1.15–1.40)
HCT: 29 % — ABNORMAL LOW (ref 39.0–52.0)
HCT: 29 % — ABNORMAL LOW (ref 39.0–52.0)
HCT: 26 % — ABNORMAL LOW (ref 39.0–52.0)
Hemoglobin: 9.9 g/dL — ABNORMAL LOW (ref 13.0–17.0)
Hemoglobin: 9.9 g/dL — ABNORMAL LOW (ref 13.0–17.0)
Hemoglobin: 8.8 g/dL — ABNORMAL LOW (ref 13.0–17.0)
O2 Saturation: 96 %
O2 Saturation: 92 %
O2 Saturation: 97 %
Patient temperature: 100.4
Patient temperature: 99.6
Potassium: 5.2 mmol/L — ABNORMAL HIGH (ref 3.5–5.1)
Potassium: 4.9 mmol/L (ref 3.5–5.1)
Potassium: 4.7 mmol/L (ref 3.5–5.1)
Sodium: 143 mmol/L (ref 135–145)
Sodium: 145 mmol/L (ref 135–145)
Sodium: 147 mmol/L — ABNORMAL HIGH (ref 135–145)
TCO2: 25 mmol/L (ref 22–32)
TCO2: 29 mmol/L (ref 22–32)
TCO2: 31 mmol/L (ref 22–32)
pCO2 arterial: 56.6 mmHg — ABNORMAL HIGH (ref 32.0–48.0)
pCO2 arterial: 65.6 mmHg (ref 32.0–48.0)
pCO2 arterial: 65.1 mmHg (ref 32.0–48.0)
pH, Arterial: 7.235 — ABNORMAL LOW (ref 7.350–7.450)
pH, Arterial: 7.228 — ABNORMAL LOW (ref 7.350–7.450)
pH, Arterial: 7.264 — ABNORMAL LOW (ref 7.350–7.450)
pO2, Arterial: 101 mmHg (ref 83.0–108.0)
pO2, Arterial: 79 mmHg — ABNORMAL LOW (ref 83.0–108.0)
pO2, Arterial: 101 mmHg (ref 83.0–108.0)

## 2019-07-20 LAB — COMPREHENSIVE METABOLIC PANEL
ALT: 51 U/L — ABNORMAL HIGH (ref 0–44)
ALT: 47 U/L — ABNORMAL HIGH (ref 0–44)
AST: 74 U/L — ABNORMAL HIGH (ref 15–41)
AST: 66 U/L — ABNORMAL HIGH (ref 15–41)
Albumin: 2 g/dL — ABNORMAL LOW (ref 3.5–5.0)
Albumin: 1.8 g/dL — ABNORMAL LOW (ref 3.5–5.0)
Alkaline Phosphatase: 169 U/L — ABNORMAL HIGH (ref 38–126)
Alkaline Phosphatase: 154 U/L — ABNORMAL HIGH (ref 38–126)
Anion gap: 12 (ref 5–15)
Anion gap: 12 (ref 5–15)
BUN: 40 mg/dL — ABNORMAL HIGH (ref 8–23)
BUN: 43 mg/dL — ABNORMAL HIGH (ref 8–23)
CO2: 25 mmol/L (ref 22–32)
CO2: 28 mmol/L (ref 22–32)
Calcium: 7.6 mg/dL — ABNORMAL LOW (ref 8.9–10.3)
Calcium: 7.2 mg/dL — ABNORMAL LOW (ref 8.9–10.3)
Chloride: 108 mmol/L (ref 98–111)
Chloride: 108 mmol/L (ref 98–111)
Creatinine, Ser: 2.75 mg/dL — ABNORMAL HIGH (ref 0.61–1.24)
Creatinine, Ser: 3.09 mg/dL — ABNORMAL HIGH (ref 0.61–1.24)
GFR calc Af Amer: 26 mL/min — ABNORMAL LOW (ref >60)
GFR calc Af Amer: 23 mL/min — ABNORMAL LOW (ref >60)
GFR calc non Af Amer: 23 mL/min — ABNORMAL LOW (ref >60)
GFR calc non Af Amer: 20 mL/min — ABNORMAL LOW (ref >60)
Glucose, Bld: 197 mg/dL — ABNORMAL HIGH (ref 70–99)
Glucose, Bld: 204 mg/dL — ABNORMAL HIGH (ref 70–99)
Potassium: 4.9 mmol/L (ref 3.5–5.1)
Potassium: 4.8 mmol/L (ref 3.5–5.1)
Sodium: 145 mmol/L (ref 135–145)
Sodium: 148 mmol/L — ABNORMAL HIGH (ref 135–145)
Total Bilirubin: 2.7 mg/dL — ABNORMAL HIGH (ref 0.3–1.2)
Total Bilirubin: 2.3 mg/dL — ABNORMAL HIGH (ref 0.3–1.2)
Total Protein: 6.7 g/dL (ref 6.5–8.1)
Total Protein: 6 g/dL — ABNORMAL LOW (ref 6.5–8.1)

## 2019-07-20 LAB — PHOSPHORUS
Phosphorus: 6.8 mg/dL — ABNORMAL HIGH (ref 2.5–4.6)
Phosphorus: 3.9 mg/dL (ref 2.5–4.6)

## 2019-07-20 LAB — TYPE AND SCREEN
ABO/RH(D): A POS
Antibody Screen: NEGATIVE
Unit division: 0
Unit division: 0
Unit division: 0

## 2019-07-20 LAB — BPAM RBC
Blood Product Expiration Date: 202101222359
Blood Product Expiration Date: 202101222359
Blood Product Expiration Date: 202101232359
ISSUE DATE / TIME: 202101030554
ISSUE DATE / TIME: 202101052249
ISSUE DATE / TIME: 202101060300
Unit Type and Rh: 6200
Unit Type and Rh: 6200
Unit Type and Rh: 6200

## 2019-07-20 LAB — CBC WITH DIFFERENTIAL/PLATELET
Abs Immature Granulocytes: 0.11 10*3/uL — ABNORMAL HIGH (ref 0.00–0.07)
Basophils Absolute: 0 10*3/uL (ref 0.0–0.1)
Basophils Relative: 0 %
Eosinophils Absolute: 0 10*3/uL (ref 0.0–0.5)
Eosinophils Relative: 0 %
HCT: 30.6 % — ABNORMAL LOW (ref 39.0–52.0)
Hemoglobin: 9.2 g/dL — ABNORMAL LOW (ref 13.0–17.0)
Immature Granulocytes: 1 %
Lymphocytes Relative: 10 %
Lymphs Abs: 1 10*3/uL (ref 0.7–4.0)
MCH: 28.4 pg (ref 26.0–34.0)
MCHC: 30.1 g/dL (ref 30.0–36.0)
MCV: 94.4 fL (ref 80.0–100.0)
Monocytes Absolute: 0.7 10*3/uL (ref 0.1–1.0)
Monocytes Relative: 6 %
Neutro Abs: 8.7 10*3/uL — ABNORMAL HIGH (ref 1.7–7.7)
Neutrophils Relative %: 83 %
Platelets: 144 10*3/uL — ABNORMAL LOW (ref 150–400)
RBC: 3.24 MIL/uL — ABNORMAL LOW (ref 4.22–5.81)
RDW: 17.2 % — ABNORMAL HIGH (ref 11.5–15.5)
WBC: 10.5 10*3/uL (ref 4.0–10.5)
nRBC: 2.8 % — ABNORMAL HIGH (ref 0.0–0.2)

## 2019-07-20 LAB — C-REACTIVE PROTEIN: CRP: 40.6 mg/dL — ABNORMAL HIGH (ref <1.0)

## 2019-07-20 LAB — GLUCOSE, CAPILLARY
Glucose-Capillary: 158 mg/dL — ABNORMAL HIGH (ref 70–99)
Glucose-Capillary: 159 mg/dL — ABNORMAL HIGH (ref 70–99)
Glucose-Capillary: 178 mg/dL — ABNORMAL HIGH (ref 70–99)
Glucose-Capillary: 184 mg/dL — ABNORMAL HIGH (ref 70–99)
Glucose-Capillary: 179 mg/dL — ABNORMAL HIGH (ref 70–99)
Glucose-Capillary: 189 mg/dL — ABNORMAL HIGH (ref 70–99)

## 2019-07-20 LAB — LACTIC ACID, PLASMA
Lactic Acid, Venous: 3.1 mmol/L (ref 0.5–1.9)
Lactic Acid, Venous: 4.1 mmol/L (ref 0.5–1.9)

## 2019-07-20 LAB — PROCALCITONIN: Procalcitonin: 19.29 ng/mL

## 2019-07-20 LAB — ECHOCARDIOGRAM COMPLETE
Height: 78 in
Weight: 6183.46 oz

## 2019-07-20 LAB — D-DIMER, QUANTITATIVE: D-Dimer, Quant: >20 ug/mL-FEU — ABNORMAL HIGH (ref 0.00–0.50)

## 2019-07-20 LAB — HEPARIN LEVEL (UNFRACTIONATED): Heparin Unfractionated: <0.1 IU/mL — ABNORMAL LOW (ref 0.30–0.70)

## 2019-07-20 LAB — FERRITIN: Ferritin: 385 ng/mL — ABNORMAL HIGH (ref 24–336)

## 2019-07-20 MED ORDER — SODIUM BICARBONATE 8.4 % IV SOLN
INTRAVENOUS | Status: AC
  Filled 2019-07-20: qty 100

## 2019-07-20 MED ORDER — AMIODARONE HCL IN DEXTROSE 360-4.14 MG/200ML-% IV SOLN
60.0000 mg/h | INTRAVENOUS | Status: AC
  Filled 2019-07-20: qty 200

## 2019-07-20 MED ORDER — PHENYLEPHRINE CONCENTRATED 100MG/250ML (0.4 MG/ML) INFUSION SIMPLE
0.0000 ug/min | INTRAVENOUS | Status: DC
  Administered 2019-07-20: 250 ug/min via INTRAVENOUS
  Administered 2019-07-20: 70 ug/min via INTRAVENOUS
  Administered 2019-07-20 (×2): 300 ug/min via INTRAVENOUS
  Administered 2019-07-20: 50 ug/min via INTRAVENOUS
  Administered 2019-07-21 (×2): 400 ug/min via INTRAVENOUS
  Administered 2019-07-21: 330 ug/min via INTRAVENOUS
  Filled 2019-07-20 (×6): qty 250

## 2019-07-20 MED ORDER — PRO-STAT SUGAR FREE PO LIQD
60.0000 mL | Freq: Four times a day (QID) | ORAL | Status: DC
  Administered 2019-07-21: 08:00:00 60 mL
  Filled 2019-07-20 (×2): qty 60

## 2019-07-20 MED ORDER — SODIUM BICARBONATE 8.4 % IV SOLN
100.0000 meq | Freq: Once | INTRAVENOUS | Status: AC
  Administered 2019-07-20: 10:00:00 100 meq via INTRAVENOUS
  Filled 2019-07-20: qty 100

## 2019-07-20 MED ORDER — EPINEPHRINE HCL 5 MG/250ML IV SOLN IN NS
0.5000 ug/min | INTRAVENOUS | Status: DC
  Administered 2019-07-20: 20:00:00 8 ug/min via INTRAVENOUS
  Administered 2019-07-20: 10 ug/min via INTRAVENOUS
  Administered 2019-07-21: 08:00:00 20 ug/min via INTRAVENOUS
  Administered 2019-07-21: 04:00:00 8 ug/min via INTRAVENOUS
  Administered 2019-07-21: 12:00:00 20 ug/min via INTRAVENOUS
  Filled 2019-07-20 (×6): qty 250

## 2019-07-20 MED ORDER — SODIUM CHLORIDE 0.9 % IV SOLN
0.0000 ug/kg/min | INTRAVENOUS | Status: DC
  Administered 2019-07-20 – 2019-07-21 (×5): 5 ug/kg/min via INTRAVENOUS
  Filled 2019-07-20 (×9): qty 20

## 2019-07-20 MED ORDER — SODIUM BICARBONATE 8.4 % IV SOLN
100.0000 meq | Freq: Once | INTRAVENOUS | Status: AC
  Administered 2019-07-20: 03:00:00 100 meq via INTRAVENOUS

## 2019-07-20 MED ORDER — CHLORHEXIDINE GLUCONATE 0.12 % MT SOLN
15.0000 mL | Freq: Two times a day (BID) | OROMUCOSAL | Status: DC
  Administered 2019-07-20 – 2019-07-21 (×2): 15 mL via OROMUCOSAL
  Filled 2019-07-20: qty 15

## 2019-07-20 MED ORDER — HYDROCORTISONE NA SUCCINATE PF 100 MG IJ SOLR
50.0000 mg | Freq: Four times a day (QID) | INTRAMUSCULAR | Status: DC
  Administered 2019-07-20 – 2019-07-21 (×5): 50 mg via INTRAVENOUS
  Filled 2019-07-20 (×5): qty 2

## 2019-07-20 MED ORDER — AMIODARONE HCL IN DEXTROSE 360-4.14 MG/200ML-% IV SOLN
30.0000 mg/h | INTRAVENOUS | Status: DC
  Administered 2019-07-20 – 2019-07-21 (×3): 30 mg/h via INTRAVENOUS
  Filled 2019-07-20 (×2): qty 200

## 2019-07-20 MED ORDER — NOREPINEPHRINE 16 MG/250ML-% IV SOLN
0.0000 ug/min | INTRAVENOUS | Status: DC
  Administered 2019-07-20: 50 ug/min via INTRAVENOUS
  Administered 2019-07-21: 07:00:00 2 ug/min via INTRAVENOUS
  Filled 2019-07-20 (×4): qty 250

## 2019-07-20 MED ORDER — VASOPRESSIN 20 UNIT/ML IV SOLN
0.0400 [IU]/min | INTRAVENOUS | Status: DC
  Administered 2019-07-20 – 2019-07-21 (×3): 0.04 [IU]/min via INTRAVENOUS
  Filled 2019-07-20 (×3): qty 2

## 2019-07-20 MED ORDER — PANTOPRAZOLE SODIUM 40 MG PO PACK
40.0000 mg | PACK | Freq: Every day | ORAL | Status: DC
  Administered 2019-07-21: 40 mg
  Filled 2019-07-20: qty 20

## 2019-07-20 MED ORDER — HEPARIN (PORCINE) 25000 UT/250ML-% IV SOLN
2000.0000 [IU]/h | INTRAVENOUS | Status: DC
  Administered 2019-07-20: 1700 [IU]/h via INTRAVENOUS
  Administered 2019-07-21: 07:00:00 2000 [IU]/h via INTRAVENOUS
  Filled 2019-07-20 (×2): qty 250

## 2019-07-20 MED ORDER — AMIODARONE LOAD VIA INFUSION: 150.0000 mg | Freq: Once | INTRAVENOUS | Status: DC

## 2019-07-20 MED ORDER — PHENYLEPHRINE HCL-NACL 10-0.9 MG/250ML-% IV SOLN
INTRAVENOUS | Status: AC
  Filled 2019-07-20: qty 250

## 2019-07-20 MED ORDER — LINEZOLID 600 MG/300ML IV SOLN
600.0000 mg | Freq: Two times a day (BID) | INTRAVENOUS | Status: DC
  Administered 2019-07-20 – 2019-07-21 (×2): 600 mg via INTRAVENOUS
  Filled 2019-07-20 (×3): qty 300

## 2019-07-20 MED ORDER — AMIODARONE HCL IN DEXTROSE 360-4.14 MG/200ML-% IV SOLN: 60.0000 mg/h | INTRAVENOUS | Status: DC

## 2019-07-20 MED ORDER — VITAL 1.5 CAL PO LIQD: 1000.0000 mL | ORAL | Status: DC

## 2019-07-20 MED ORDER — AMIODARONE HCL IN DEXTROSE 360-4.14 MG/200ML-% IV SOLN: 30.0000 mg/h | INTRAVENOUS | Status: DC

## 2019-07-20 MED ORDER — AMIODARONE IV BOLUS ONLY 150 MG/100ML
150.0000 mg | Freq: Once | INTRAVENOUS | Status: AC
  Administered 2019-07-20: 10:00:00 150 mg via INTRAVENOUS
  Filled 2019-07-20: qty 100

## 2019-07-20 MED ORDER — LACTATED RINGERS IV BOLUS
1000.0000 mL | Freq: Once | INTRAVENOUS | Status: AC
  Administered 2019-07-20: 11:00:00 1000 mL via INTRAVENOUS

## 2019-07-20 NOTE — Progress Notes (Signed)
Unable to administer OG medications. OG tube will not flush. Attempted to pull back and reposition, but unable to advance OG tube. OG tube removed and distal kink noted once removed.

## 2019-07-20 NOTE — Progress Notes (Signed)
ANTICOAGULATION CONSULT NOTE - Initial Consult  Pharmacy Consult for heparin Indication: atrial fibrillation (Eliquis PTA)   Allergies  Allergen Reactions  . Vancomycin     REACTION: ARF    Patient Measurements: Height: '6\' 6"'$  (198.1 cm) Weight: (unable to obtain due to pt on bariatric bed) IBW/kg (Calculated) : 91.4 Heparin Dosing Weight:   Vital Signs: Temp: 99.9 F (37.7 C) (01/07 0723) Temp Source: Oral (01/07 0723) BP: 124/50 (01/07 0600) Pulse Rate: 110 (01/07 0600)  Labs: Recent Labs    07/18/19 0640 07/19/19 0945 07/20/19 0223 07/20/19 0330 07/20/19 0540  HGB 7.2* 8.9* 9.9* 9.2* 9.9*  HCT 24.2* 28.7* 29.0* 30.6* 29.0*  PLT 148* 130*  --  144*  --   CREATININE 1.68* 1.92*  --  2.75*  --     Estimated Creatinine Clearance: 44.8 mL/min (A) (by C-G formula based on SCr of 2.75 mg/dL (H)).   Medical History: Past Medical History:  Diagnosis Date  . Arthritis    "elbows & knees" (12/13/2014)  . Asthma   . CKD (chronic kidney disease), stage III   . DIABETIC FOOT ULCER 06/20/2009  . Edema, macular, due to secondary diabetes ([place])   . Erectile dysfunction   . GERD (gastroesophageal reflux disease)   . HEARING LOSS, SENSORINEURAL, BILATERAL 01/03/2007   Seen by ENT Dr. Orpah Greek D. Red[place] Baseman 01/03/07  . Hemorrhoids   . History of echocardiogram    a. 04/2008 Echo: EF 50-55%, abnl LV relaxation, mildly dil LA.  Marland Kitchen Hyperlipidemia   . Hypertension   . Morbid obesity ([place])   . Neuropathy, lower extremity   . OSA (obstructive sleep apnea)    uses CPAP nightly  . OSA on CPAP    Nocturnal polysomnogram on 01/21/2010 showed severe obstructive sleep apnea/hypopnea syndrome, AHI 74.1 per hour with non positional events, moderately loud snoring, and oxygen desaturation to a nadir of 78% on room air.  CPAP was successfully titrated to 17 CWP, AHI 1.1 per hour using a large ResMed Mirage Quattro full-face mask with heated humidifier. Bruxism was noted.   . Osteomyelitis of ankle  and foot ([place])   . Retinopathy   . Type II diabetes mellitus ([facility])    w/complication NOS, type II    Medications:  Facility-Administered Medications Prior to Admission  Medication Dose Route Frequency Provider Last Rate Last Admin  . 0.9 %  sodium chloride infusion   Intravenous Continuous Jean Rosenthal, MD 100 mL/hr at 07/06/2019 1715 New Bag at 07/08/2019 1715   Medications Prior to Admission  Medication Sig Dispense Refill Last Dose  . albuterol (PROAIR HFA) 108 (90 Base) MCG/ACT inhaler Inhale 2 puffs into the lungs every 6 (six) hours as needed for wheezing or shortness of breath. 24.7 g 3 Past Week at Unknown time  . AMITIZA 24 MCG capsule Take 24 mcg by mouth 2 (two) times daily.   06/26/2019  . apixaban (ELIQUIS) 5 MG TABS tablet Take 1 tablet (5 mg total) by mouth 2 (two) times daily. 180 tablet 1 06/26/2019 at 1700  . atorvastatin (LIPITOR) 20 MG tablet TAKE 1 TABLET(20 MG) BY MOUTH DAILY (Patient taking differently: Take 20 mg by mouth daily. ) 90 tablet 1 06/26/2019  . Cholecalciferol (VITAMIN D3) 50 MCG (2000 UT) TABS Take 2,000 Units by mouth daily.   06/26/2019  . enalapril (VASOTEC) 20 MG tablet TAKE 2 TABLETS(40 MG) BY MOUTH DAILY (Patient taking differently: Take 40 mg by mouth daily. ) 180 tablet 1 06/26/2019  . furosemide (  LASIX) 20 MG tablet Take 1 tablet (20 mg total) by mouth daily. 90 tablet 1 06/26/2019  . gabapentin (NEURONTIN) 300 MG capsule TAKE 1 CAPSULE(300 MG) BY MOUTH AT BEDTIME (Patient taking differently: Take 300 mg by mouth at bedtime. ) 90 capsule 1 06/26/2019  . HUMULIN R 500 UNIT/ML injection DRAW INSULIN TO THE 68 UNIT LINE ON THE U-100 SYRINGE(TO USE 340 UNITS) PER DAY AS DIRECTED (Patient taking differently: Inject into the skin See admin instructions. Draw insulin to the 68 units line on the U-100 Syringe(to use 340 units) per day as directed.) 20 mL 0 06/26/2019  . HYDROcodone-acetaminophen (NORCO) 7.5-325 MG tablet Take 1 tablet by mouth every 8 (eight)  hours as needed for moderate pain or severe pain. 15 tablet 0 06/26/2019  . hydroxypropyl methylcellulose / hypromellose (ISOPTO TEARS / GONIOVISC) 2.5 % ophthalmic solution Place 1-2 drops into both eyes 3 (three) times daily as needed for dry eyes (dry/irritated eyes.).   06/26/2019  . LUMIGAN 0.01 % SOLN Place 1 drop into both eyes at bedtime.   06/26/2019  . metoprolol succinate (TOPROL XL) 25 MG 24 hr tablet Take 1 tablet (25 mg total) by mouth daily. 30 tablet 11 06/26/2019 at 1700  . promethazine (PHENERGAN) 25 MG tablet TAKE 1 TABLET(25 MG) BY MOUTH EVERY 8 HOURS AS NEEDED FOR NAUSEA OR VOMITING (Patient taking differently: Take 25 mg by mouth every 8 (eight) hours as needed for nausea or vomiting. ) 90 tablet 0 Past Month at Unknown time  . RESTASIS 0.05 % ophthalmic emulsion Place 1 drop into both eyes daily.   06/26/2019  . SIMBRINZA 1-0.2 % SUSP Place 1 drop into both eyes 2 (two) times daily.    06/26/2019  . ACCU-CHEK FASTCLIX LANCETS MISC Check blood sugar 5 times a day 408 each 3   . Blood Glucose Monitoring Suppl (ACCU-CHEK AVIVA PLUS) w/Device KIT CHECK BLOOD SUGAR 5 TIMES A DAY 1 kit 0   . Continuous Blood Gluc Sensor (FREESTYLE LIBRE 14 DAY SENSOR) MISC 1 each by Does not apply route 6 (six) times daily. 2 each 12   . glucose blood (ACCU-CHEK AVIVA PLUS) test strip USE TO CHECK BLOOD FIVE TIMES DAILY 450 each 1   . glucose blood (FREESTYLE PRECISION NEO TEST) test strip 1 each by Other route as needed for other. Use as instructed to check blood sugar 2 times daily. 100 each 3   . Insulin Disposable Pump (V-GO 20) KIT USE DAILY WITH INSULIN 30 kit 2   . INSULIN SYRINGE 1CC/29G 29G X 1/2" 1 ML MISC USE DAILY AS DIRECTED 100 each 0     Assessment: 57 YOM with h/o Afib on apixaban at home was transitioned to IV heparin after admission to the hospital but then developed a chest wall hematoma on 1/2 requiring blood transfusion. Hgb now stable and given high risk of VTE and rising  D-dimer, resuming IV heparin  H/H low stable, Plt low stable, SCr trending up   Goal of Therapy:  Heparin level 0.3-0.5 units/ml Monitor platelets by anticoagulation protocol: Yes   Plan:  -Restart IV heparin at 1700 units/hr. No heparin bolus due to recent hematoma  -F/u 8 hr HL -Monitor daily HL, CBC and s/s of bleeding   Albertina Parr, PharmD., BCPS Clinical Pharmacist Clinical phone for 07/19/18 until 5pm: [phone]

## 2019-07-20 NOTE — Progress Notes (Signed)
NAME:  [name], MRN:  [id number], DOB:  12-24-1950, LOS: 50 ADMISSION DATE:  07/05/2019, CONSULTATION DATE:  12/16 REFERRING MD:  Sherral Hammers, CHIEF COMPLAINT:  dyspnea   Brief History   69 y/o male admitted on 12/15 with COVID pneumonia, PCCM seen on 12/16.  Improved initially, was supposed to go to CIR. However by 1/5 he had worsening hypoxemia and infiltrates.  On 1/6 moved to ICU and intubated.   Past Medical History   Past Medical History:  Diagnosis Date  . Arthritis    "elbows & knees" (12/13/2014)  . Asthma   . CKD (chronic kidney disease), stage III   . DIABETIC FOOT ULCER 06/20/2009  . Edema, macular, due to secondary diabetes ([place])   . Erectile dysfunction   . GERD (gastroesophageal reflux disease)   . HEARING LOSS, SENSORINEURAL, BILATERAL 01/03/2007   Seen by ENT Dr. Orpah Greek D. Redmond Baseman 01/03/07  . Hemorrhoids   . History of echocardiogram    a. 04/2008 Echo: EF 50-55%, abnl LV relaxation, mildly dil LA.  Marland Kitchen Hyperlipidemia   . Hypertension   . Morbid obesity ([place])   . Neuropathy, lower extremity   . OSA (obstructive sleep apnea)    uses CPAP nightly  . OSA on CPAP    Nocturnal polysomnogram on 01/21/2010 showed severe obstructive sleep apnea/hypopnea syndrome, AHI 74.1 per hour with non positional events, moderately loud snoring, and oxygen desaturation to a nadir of 78% on room air.  CPAP was successfully titrated to 17 CWP, AHI 1.1 per hour using a large ResMed Mirage Quattro full-face mask with heated humidifier. Bruxism was noted.   . Osteomyelitis of ankle and foot ([place])   . Retinopathy   . Type II diabetes mellitus ([place])    w/complication NOS, type II  ]   Significant Hospital Events   12/15 admit 1/1 down to 3-4 L O2 1/4-1/6 worsening hypoxemia, d-dimer up, intubated on 1/6 with worsening infiltrates  Consults:  PCCM  Procedures:  ETT 1/6 >  R IJ CVL 1/6 >   Significant Diagnostic Tests:  CXR 12/15 > extensive b/l airspace disease with vascular  congestion. Echo 12/19 LVEF 5055%, RV normal not well visualized CT chest 12/24 and 12/27 >> both show extensive bilateral patchy airspace disease, enlarged pectoralis major on the left with area of edema/hemorrhage and hematoma  Micro Data:  Flu 12/15 > neg. SARS CoV 2 12/15 > pos. 1/6 resp culture > GPC in clusters, GNR, GN coccobacilli 1/6 blood >   Antimicrobials:  12/15 remdesivir > 12/19 12/15 decadron > 10 days  1/6 cefepime >  1/7 linezolid >  Interim history/subjective:  Kidney function worse Intubated overnight Severe tachycardia this morning, cardioverted Had been here for several weeks WBC up, procalcitonin up Worsening shock  Objective   Blood pressure (!) 124/50, pulse (!) 110, temperature 99.9 F (37.7 C), temperature source Oral, resp. rate (!) 0, height 6\' 6"  (1.981 m), weight (!) 175.3 kg, SpO2 94 %.    Vent Mode: PRVC FiO2 (%):  [70 %-100 %] 70 % Set Rate:  [30 bmp] 30 bmp Vt Set:  [540 mL-640 mL] 640 mL PEEP:  [16 cmH20-18 cmH20] 16 cmH20 Plateau Pressure:  [31 cmH20-36 cmH20] 36 cmH20   Intake/Output Summary (Last 24 hours) at 07/20/2019 1002 Last data filed at 07/20/2019 0857 Gross per 24 hour  Intake 3596.96 ml  Output 580 ml  Net 3016.96 ml   Filed Weights   07/14/19 0637 07/15/19 0352 07/16/19 0500  Weight: Marland Kitchen)  175.5 kg (!) 175.5 kg (!) 175.3 kg    Examination:  General:  In bed on vent HENT: NCAT ETT in place PULM: CTA B, vent supported breathing CV: RRR, no mgr GI: BS+, soft, nontender MSK: normal bulk and tone Neuro: sedated on vent    Resolved Hospital Problem list     Assessment & Plan:  ARDS due to COVID 19 pneumonia Worsening infiltrates on 1/7> due to HCAP Follow resp culture Agree with adding MRSA coverage Continue mechanical ventilation per ARDS protocol Target TVol 6-8cc/kgIBW Target Plateau Pressure < 30cm H20 Target driving pressure less than 15 cm of water Target PaO2 55-65: titrate PEEP/FiO2 per protocol As  long as PaO2 to FiO2 ratio is less than 1:150 position in prone position for 16 hours a day Check CVP daily if CVL in place Target CVP less than 4, diurese as necessary Ventilator associated pneumonia prevention protocol  Shock: septic due to bacterial pneumonia, severe ddx includes cardiogenic Check CVP Add neo, vaso Check venous blood gas centralline Get levophed off Correct acidosis Give calcium Bolus another liter LR  Atrial fibrillation, SVT 1/7  D-dimer elevated Add amiodarone Repeat Echo Lower ext doppler pending  AKI worsening, oliguric Monitor BMET and UOP Replace electrolytes as needed BMET later today  Metabolic acidosis  Check lactic acid 2 amps bicarb Continue bicarb gtt Repeat abg later today  Need for sedation for mechanical vent Paralytic protocol RASS -5 Fentanyl/versed  Best practice:  Diet: tube feeding Pain/Anxiety/Delirium protocol (if indicated): yes VAP protocol (if indicated): yes DVT prophylaxis: eliquis GI prophylaxis: famoti Glucose control: ssi Mobility: bed rest Code Status: full Family Communication: I spoke to his son [name] today and updated him on his father's criticl condition  I advised that his prognosis is poor at this point.  Disposition: remain in ICU  Labs   CBC: Recent Labs  Lab 07/16/19 0134 07/16/19 1030 07/17/19 0847 07/18/19 0640 07/19/19 0945 07/19/19 1640 07/19/19 2050 07/20/19 0223 07/20/19 0330 07/20/19 0540  WBC 8.2 8.2 7.9 8.3 9.9  --   --   --  10.5  --   NEUTROABS 6.4  --  6.1 6.3 8.2*  --   --   --  8.7*  --   HGB 6.6* 7.2* 7.2* 7.2* 8.9* 9.9* 10.2* 9.9* 9.2* 9.9*  HCT 22.0* 24.0* 24.2* 24.2* 28.7* 29.0* 30.0* 29.0* 30.6* 29.0*  MCV 94.8 92.3 92.7 92.7 91.4  --   --   --  94.4  --   PLT 163 161 172 148* 130*  --   --   --  144*  --     Basic Metabolic Panel: Recent Labs  Lab 07/16/19 0134 07/17/19 0847 07/18/19 0640 07/19/19 0945 07/19/19 1640 07/19/19 2050 07/20/19 0223 07/20/19 0330  07/20/19 0540  NA 138 138 139 140 142 142 143 145 145  K 5.1 4.2 4.1 4.2 4.5 5.5* 5.2* 4.9 4.9  CL 109 108 107 108  --   --   --  108  --   CO2 22 23 21* 20*  --   --   --  25  --   GLUCOSE 140* 118* 106* 181*  --   --   --  197*  --   BUN 35* 29* 29* 31*  --   --   --  40*  --   CREATININE 1.80* 1.79* 1.68* 1.92*  --   --   --  2.75*  --   CALCIUM 8.1* 8.1* 8.0* 8.2*  --   --   --  7.6*  --   MG 1.9 1.8 1.7 1.8  --   --   --  1.9  --   PHOS 3.6 3.3 3.5 4.4  --   --   --  6.8*  --    GFR: Estimated Creatinine Clearance: 44.8 mL/min (A) (by C-G formula based on SCr of 2.75 mg/dL (H)). Recent Labs  Lab 07/17/19 0847 07/18/19 0640 07/19/19 0945 07/19/19 1742 07/20/19 0330  PROCALCITON  --  0.61  --  2.30 19.29  WBC 7.9 8.3 9.9  --  10.5    Liver Function Tests: Recent Labs  Lab 07/16/19 0134 07/17/19 0847 07/18/19 0640 07/19/19 0945 07/20/19 0330  AST 21 21 23 31  74*  ALT 26 23 21 24  51*  ALKPHOS 102 100 95 135* 169*  BILITOT 1.0 0.9 1.0 2.1* 2.7*  PROT 5.4* 6.0* 6.0* 6.5 6.7  ALBUMIN 2.1* 2.1* 2.2* 2.2* 2.0*   No results for input(s): LIPASE, AMYLASE in the last 168 hours. No results for input(s): AMMONIA in the last 168 hours.  ABG    Component Value Date/Time   PHART 7.228 (L) 07/20/2019 0540   PCO2ART 65.6 (HH) 07/20/2019 0540   PO2ART 79.0 (L) 07/20/2019 0540   HCO3 27.2 07/20/2019 0540   TCO2 29 07/20/2019 0540   ACIDBASEDEF 1.0 07/20/2019 0540   O2SAT 92.0 07/20/2019 0540     Coagulation Profile: Recent Labs  Lab 07/16/19 1030  INR 1.1    Cardiac Enzymes: No results for input(s): CKTOTAL, CKMB, CKMBINDEX, TROPONINI in the last 168 hours.  HbA1C: Hemoglobin A1C  Date/Time Value Ref Range Status  06/06/2019 10:46 AM 8.2 (A) 4.0 - 5.6 % Final   Hgb A1c MFr Bld  Date/Time Value Ref Range Status  02/28/2019 10:10 AM 9.8 (H) 4.6 - 6.5 % Final    Comment:    Glycemic Control Guidelines for People with Diabetes:Non Diabetic:  <6%Goal of Therapy:  <7%Additional Action Suggested:  >8%   11/14/2018 09:54 AM 10.5 (H) 4.6 - 6.5 % Final    Comment:    Glycemic Control Guidelines for People with Diabetes:Non Diabetic:  <6%Goal of Therapy: <7%Additional Action Suggested:  >8%     CBG: Recent Labs  Lab 07/19/19 1603 07/19/19 2127 07/20/19 0050 07/20/19 0313 07/20/19 0720  GLUCAP 119* 185* 158* 159* 178*     Critical care time: 45 minutes    Roselie Awkward, MD [facility] Pager: [phone] Cell: [phone] If no response, call [phone]

## 2019-07-20 NOTE — Progress Notes (Signed)
[facility] PCCM  I spoke to his wife Mae at length this evening and let her know that we continue to resuscitate her husband as aggressively as possible yet he remains profoundly ill.  He is in severe shock, and very hypoxemic despite the ventilator and high doses of vasopressors.  I explained that in the event of another cardiac arrest we would not be able to bring him back with CPR or shocks and expect him to live with any meaningful quality of life.  She voiced understanding. Based on this I changed his code status to DNR, I explained this to her.  Roselie Awkward, MD [facility] P[facility]M Pager: [phone] Cell: [phone] If no response, call [phone]

## 2019-07-20 NOTE — Progress Notes (Signed)
Inpatient Rehabilitation Admissions Coordinator  Noted events overnight. I will follow at a distance.  Danne Baxter, RN, MSN Rehab Admissions Coordinator [phone] 07/20/2019 10:31 AM

## 2019-07-20 NOTE — Progress Notes (Signed)
[facility] Progress Note Patient Name: DIO BETHELL DOB: 1950/11/08 MRN: [id number]   Date of Service  07/20/2019  HPI/Events of Note  ABG on 70%/PRVC 30/TV 640/P 16 = 7.22/65.6/79/27.2. No room to increase PRVC rate at Ppeak = 40 and Pplat = 37.  eICU Interventions  Will order: 1. NaHCO3 100 meq IV now. 2. Increase NaHCO3 IV infusion to 150 mL/hour.  3. Repeat ABG at 7:30 AM.      Intervention Category Major Interventions: Acid-Base disturbance - evaluation and management;Respiratory failure - evaluation and management  Lemar Bakos Cornelia Copa 07/20/2019, 5:46 AM

## 2019-07-20 NOTE — Progress Notes (Addendum)
Initial Nutrition Assessment  DOCUMENTATION CODES:   Morbid obesity  INTERVENTION:   Recommend TF once correct OG tube placement confirmed  Tube Feeding:  Vital 1.5 at 65 ml/hr Begin TF at 20 ml/hr; titrate by 10 mL q 12 hours until goal rate of 65 ml/hr Pro-Stat 60 mL QID Provides 3140 kcals, 225 g of protein and 1186 mL of free water Meets 100% of protein needs, >90 % calorie needs  Recommend addition of free water flushes once TF tolerance established   NUTRITION DIAGNOSIS:   Inadequate oral intake related to acute illness, catabolic illness as evidenced by NPO status.  GOAL:   Patient will meet greater than or equal to 90% of their needs  MONITOR:   Vent status, TF tolerance, Labs, Weight trends, Skin  REASON FOR ASSESSMENT:   Consult Diet education  ASSESSMENT:   68 yo admitted on 12/15 with COVID-19 pneumonia, developed ARDS and ultimately required intubation on 1/06, worsening AKI on CKD III. PMH includes DM, morbid obesity, CKD III, HTN  RD working remotely.  12/15 Admit 01/06 Intubated  Pt currently sedated on vent; sedated with fentanyl and versed. Paralyzed on vecuronium. Requiring multiple pressors. On bicarb drip  On admission, pt reporting 1 week of SOB, difficulty slwwping and poor po intake.   Recorded po intake variable; eating 0-75% of meals. Unable to obtain diet and weight history from patient at this time  Current wt 175.3; admit weight 182.4 kg  Per chest xray, OG tube appears to be in distal esophagus. Advancement required before initiation of TF or administration of meds. Discussed with RN  Hypernatremic; recommend free water flushes via tube  Labs: phosphorus 6.8 (H), sodium 147 (H), potassium wdl, magnesium wdl, Creatinine 2.75, BUN 40 Meds: ss novolog, lantus, tradjenta   Diet Order:   Diet Order    None      EDUCATION NEEDS:   Not appropriate for education at this time  Skin:  Skin Assessment: Skin Integrity  Issues: Skin Integrity Issues:: Stage II Stage II: R. Ear  Last BM:  1/6  Height:   Ht Readings from Last 1 Encounters:  06/30/19 6\' 6"  (1.981 m)    Weight:   Wt Readings from Last 1 Encounters:  07/16/19 (!) 175.3 kg    Ideal Body Weight:  (IBW 97.3 kg, Adjusted BW 125 kg)  BMI:  Body mass index is 44.66 kg/m.  Estimated Nutritional Needs:   Kcal:  [id number] kcals  Protein:  195-245 g  Fluid:  >/= 2.4 L   Patrick Kessie Croston MS, RDN, LDN, CNSC [phone] Pager  [phone] Weekend/On-Call Pager

## 2019-07-20 NOTE — Progress Notes (Signed)
OT Cancellation Note  Patient Details Name: [name] MRN: [id number] DOB: [birth year]   Cancelled Treatment:    Reason Eval/Treat Not Completed: Patient not medically ready;Medical issues which prohibited therapy. Pt transferred to the ICU and now intubated (PEEP 16) and sedated. Vent settings too high for acute therapy/mobilization at this time. OT will continue to follow acutely.   Mauri [place] 07/20/2019, 7:43 AM

## 2019-07-20 NOTE — Progress Notes (Signed)
  Echocardiogram 2D Echocardiogram has been performed.  Technically difficult study due to patient body habitus.   Covid positive  Casey Fye A Jourdon Zimmerle 07/20/2019, 12:54 PM

## 2019-07-20 NOTE — Progress Notes (Signed)
[facility] Progress Note Patient Name: [name] DOB: [birth year] MRN: [id number]   Date of Service  07/20/2019  HPI/Events of Note  ABG on 70%/PRVC 30/TV 650/P 16 = 7.235/56.6/101.0. Patient asynchronous with ventilator. Agree with nursing restarting Vecuronium IV infusion.   eICU Interventions  Will order: 1. NaHCO3 100 meq IV now.  2. Repeat ABG at 5 AM.     Intervention Category Major Interventions: Acid-Base disturbance - evaluation and management;Respiratory failure - evaluation and management  Lysle Dingwall 07/20/2019, 2:39 AM

## 2019-07-20 NOTE — Progress Notes (Signed)
PT Cancellation Note  Patient Details Name: GADIEL DELIMAN MRN: [id number] DOB: 04-Mar-1951   Cancelled Treatment:    Reason Eval/Treat Not Completed: Medical issues which prohibited therapy, PATIENT MOVED TO ICU AND NOW ON VENT. WILL FOLLOW  For PT readiness.     Marita Burnsed [place] Pager [phone] Office [phone]  07/20/2019, 7:26 AM

## 2019-07-20 NOTE — Progress Notes (Signed)
[facility] for heparin Indication: atrial fibrillation (Eliquis PTA); rising d-dimer  Allergies  Allergen Reactions  . Vancomycin     REACTION: ARF    Patient Measurements: Height: 6\' 6"  (198.1 cm) Weight: (unable to obtain due to pt on bariatric bed) IBW/kg (Calculated) : 91.4 Heparin Dosing Weight: 135 kg  Vital Signs: Temp: 99.6 F (37.6 C) (01/07 1930) Temp Source: Axillary (01/07 1930) BP: 103/65 (01/07 2038) Pulse Rate: 100 (01/07 2100)  Labs: Recent Labs    07/19/19 0945 07/20/19 0330 07/20/19 0540 07/20/19 1047 07/20/19 1100 07/20/19 2040  HGB 8.9* 9.2* 9.9* 8.8* 8.3*  --   HCT 28.7* 30.6* 29.0* 26.0* 27.5*  --   PLT 130* 144*  --   --  142*  --   HEPARINUNFRC  --   --   --   --   --  <0.10*  CREATININE 1.92* 2.75*  --   --  3.09*  --     Estimated Creatinine Clearance: 39.9 mL/min (A) (by C-G formula based on SCr of 3.09 mg/dL (H)).   Assessment: 67 YOM with h/o Afib on apixaban at home was transitioned to IV heparin after admission to the hospital but then developed a chest wall hematoma on 1/2 requiring blood transfusion. Hgb now stable and given high risk of VTE and rising D-dimer, resuming IV heparin  H/H low stable, Plt low stable, SCr trending up  Noted pt with therapeutic levels on 1850 units/hr previously during this admission.  Heparin level undetectable on 1700 units/hr. No issues with line or bleeding reported per RN.  Goal of Therapy:  Heparin level 0.3-0.5 units/ml Monitor platelets by anticoagulation protocol: Yes   Plan:  Increase IV heparin to 2000 units/hr. No heparin bolus due to recent hematoma. F/u 8 hr HL.  Sherlon Handing, PharmD, BCPS Please see amion for complete clinical pharmacist phone list 07/20/2019 10:59 PM

## 2019-07-21 ENCOUNTER — Inpatient Hospital Stay (HOSPITAL_COMMUNITY): Payer: Medicare Other

## 2019-07-21 DIAGNOSIS — D62 Acute posthemorrhagic anemia: Secondary | ICD-10-CM

## 2019-07-21 LAB — CBC WITH DIFFERENTIAL/PLATELET
Abs Immature Granulocytes: 0.77 10*3/uL — ABNORMAL HIGH (ref 0.00–0.07)
Basophils Absolute: 0.1 10*3/uL (ref 0.0–0.1)
Basophils Relative: 1 %
Eosinophils Absolute: 0 10*3/uL (ref 0.0–0.5)
Eosinophils Relative: 0 %
HCT: 30.9 % — ABNORMAL LOW (ref 39.0–52.0)
Hemoglobin: 8.7 g/dL — ABNORMAL LOW (ref 13.0–17.0)
Immature Granulocytes: 6 %
Lymphocytes Relative: 7 %
Lymphs Abs: 1 10*3/uL (ref 0.7–4.0)
MCH: 28.5 pg (ref 26.0–34.0)
MCHC: 28.2 g/dL — ABNORMAL LOW (ref 30.0–36.0)
MCV: 101.3 fL — ABNORMAL HIGH (ref 80.0–100.0)
Monocytes Absolute: 1.1 10*3/uL — ABNORMAL HIGH (ref 0.1–1.0)
Monocytes Relative: 8 %
Neutro Abs: 10.5 10*3/uL — ABNORMAL HIGH (ref 1.7–7.7)
Neutrophils Relative %: 78 %
Platelets: 219 10*3/uL (ref 150–400)
RBC: 3.05 MIL/uL — ABNORMAL LOW (ref 4.22–5.81)
RDW: 18.4 % — ABNORMAL HIGH (ref 11.5–15.5)
WBC: 13.4 10*3/uL — ABNORMAL HIGH (ref 4.0–10.5)
nRBC: 10.4 % — ABNORMAL HIGH (ref 0.0–0.2)

## 2019-07-21 LAB — COMPREHENSIVE METABOLIC PANEL
ALT: 4199 U/L — ABNORMAL HIGH (ref 0–44)
AST: 13063 U/L — ABNORMAL HIGH (ref 15–41)
Albumin: 1.7 g/dL — ABNORMAL LOW (ref 3.5–5.0)
Alkaline Phosphatase: 214 U/L — ABNORMAL HIGH (ref 38–126)
Anion gap: 32 — ABNORMAL HIGH (ref 5–15)
BUN: 60 mg/dL — ABNORMAL HIGH (ref 8–23)
CO2: 14 mmol/L — ABNORMAL LOW (ref 22–32)
Calcium: 6.6 mg/dL — ABNORMAL LOW (ref 8.9–10.3)
Chloride: 99 mmol/L (ref 98–111)
Creatinine, Ser: 5.44 mg/dL — ABNORMAL HIGH (ref 0.61–1.24)
GFR calc Af Amer: 11 mL/min — ABNORMAL LOW (ref >60)
GFR calc non Af Amer: 10 mL/min — ABNORMAL LOW (ref >60)
Glucose, Bld: 333 mg/dL — ABNORMAL HIGH (ref 70–99)
Potassium: 6.1 mmol/L — ABNORMAL HIGH (ref 3.5–5.1)
Sodium: 145 mmol/L (ref 135–145)
Total Bilirubin: 3.4 mg/dL — ABNORMAL HIGH (ref 0.3–1.2)
Total Protein: 6 g/dL — ABNORMAL LOW (ref 6.5–8.1)

## 2019-07-21 LAB — LEGIONELLA PNEUMOPHILA SEROGP 1 UR AG: L. pneumophila Serogp 1 Ur Ag: NEGATIVE

## 2019-07-21 LAB — MAGNESIUM: Magnesium: 2.5 mg/dL — ABNORMAL HIGH (ref 1.7–2.4)

## 2019-07-21 LAB — GLUCOSE, CAPILLARY
Glucose-Capillary: 211 mg/dL — ABNORMAL HIGH (ref 70–99)
Glucose-Capillary: 255 mg/dL — ABNORMAL HIGH (ref 70–99)
Glucose-Capillary: 263 mg/dL — ABNORMAL HIGH (ref 70–99)
Glucose-Capillary: 279 mg/dL — ABNORMAL HIGH (ref 70–99)
Glucose-Capillary: 302 mg/dL — ABNORMAL HIGH (ref 70–99)

## 2019-07-21 LAB — D-DIMER, QUANTITATIVE: D-Dimer, Quant: >20 ug/mL-FEU — ABNORMAL HIGH (ref 0.00–0.50)

## 2019-07-21 LAB — HEPARIN LEVEL (UNFRACTIONATED): Heparin Unfractionated: <0.1 IU/mL — ABNORMAL LOW (ref 0.30–0.70)

## 2019-07-21 LAB — PHOSPHORUS: Phosphorus: 12.8 mg/dL — ABNORMAL HIGH (ref 2.5–4.6)

## 2019-07-21 LAB — PROCALCITONIN: Procalcitonin: 32.18 ng/mL

## 2019-07-21 LAB — C-REACTIVE PROTEIN: CRP: 41.1 mg/dL — ABNORMAL HIGH (ref <1.0)

## 2019-07-21 LAB — FERRITIN: Ferritin: >7500 ng/mL — ABNORMAL HIGH (ref 24–336)

## 2019-07-21 MED ORDER — FENTANYL BOLUS VIA INFUSION
25.0000 ug | INTRAVENOUS | Status: DC | PRN
  Filled 2019-07-21: qty 50

## 2019-07-21 MED ORDER — GLYCOPYRROLATE 0.2 MG/ML IJ SOLN: 0.2000 mg | INTRAMUSCULAR | Status: DC | PRN

## 2019-07-21 MED ORDER — DEXTROSE 5 % IV SOLN: INTRAVENOUS | Status: DC

## 2019-07-21 MED ORDER — ACETAMINOPHEN 650 MG RE SUPP: 650.0000 mg | Freq: Four times a day (QID) | RECTAL | Status: DC | PRN

## 2019-07-21 MED ORDER — DIPHENHYDRAMINE HCL 50 MG/ML IJ SOLN: 25.0000 mg | INTRAMUSCULAR | Status: DC | PRN

## 2019-07-21 MED ORDER — SODIUM CHLORIDE 0.9 % IV SOLN: 2.0000 g | INTRAVENOUS | Status: DC

## 2019-07-21 MED ORDER — GLYCOPYRROLATE 1 MG PO TABS
1.0000 mg | ORAL_TABLET | ORAL | Status: DC | PRN
  Filled 2019-07-21: qty 1

## 2019-07-21 MED ORDER — POLYVINYL ALCOHOL 1.4 % OP SOLN
1.0000 [drp] | Freq: Four times a day (QID) | OPHTHALMIC | Status: DC | PRN
  Filled 2019-07-21: qty 15

## 2019-07-21 MED ORDER — ACETAMINOPHEN 325 MG PO TABS: 650.0000 mg | ORAL_TABLET | Freq: Four times a day (QID) | ORAL | Status: DC | PRN

## 2019-07-24 LAB — CULTURE, RESPIRATORY W GRAM STAIN

## 2019-07-24 LAB — CULTURE, BLOOD (ROUTINE X 2)
Culture: NO GROWTH
Culture: NO GROWTH
Special Requests: ADEQUATE
Special Requests: ADEQUATE

## 2019-08-04 DIAGNOSIS — E11621 Type 2 diabetes mellitus with foot ulcer: Secondary | ICD-10-CM | POA: Diagnosis not present

## 2019-08-04 DIAGNOSIS — L97521 Non-pressure chronic ulcer of other part of left foot limited to breakdown of skin: Secondary | ICD-10-CM | POA: Diagnosis not present

## 2019-08-04 DIAGNOSIS — E119 Type 2 diabetes mellitus without complications: Secondary | ICD-10-CM | POA: Diagnosis not present

## 2019-08-04 DIAGNOSIS — Z794 Long term (current) use of insulin: Secondary | ICD-10-CM | POA: Diagnosis not present

## 2019-08-14 NOTE — Progress Notes (Signed)
PT Cancellation Note  Patient Details Name: [name] MRN: [id number] DOB: [birth year]   Cancelled Treatment:    Reason Eval/Treat Not Completed: Medical issues which prohibited therapy, on vent, hypotension, . Will check on 1/11.    Claretha Cooper 08/09/19, 7:02 AM [place] Pager [phone] Office [phone]

## 2019-08-14 NOTE — Progress Notes (Signed)
Patient transported for family visit on vent.

## 2019-08-14 NOTE — Progress Notes (Signed)
Titrating pressors as needed to keep MAP >65, see VS trend and MAR. Patient repositioned, Rt femoral  ART line zeroed and calibrated. BP cuff MAP >60. Will continue to monitor.

## 2019-08-14 NOTE — Progress Notes (Signed)
[facility] PCCM  Family wishes to withdraw care.  They have visited with him. Plan: Comfort care orderset written Stop paralytics Stop vasopressors Hold extubation given paralytic use.   Fentanyl infusion for comfort, versed as well  Roselie Awkward, MD [place] PCCM Pager: [phone] Cell: [phone] If no response, call [phone]

## 2019-08-14 NOTE — Progress Notes (Signed)
Extubated patient per withdrawal order.

## 2019-08-14 NOTE — Progress Notes (Signed)
Maxed on Neo-Synephrine @ 400 mcg/min

## 2019-08-14 NOTE — Progress Notes (Signed)
[facility] notified of TOD. Full release to funeral services.

## 2019-08-14 NOTE — Progress Notes (Signed)
Maxed on Neo-Synephrine and Epinephrine drip. Started Levophed, titrating as needed to reach MD ordered parameters.

## 2019-08-14 NOTE — Progress Notes (Signed)
NAME:  [name], MRN:  [id number], DOB:  04-08-1951, LOS: 24 ADMISSION DATE:  06/24/2019, CONSULTATION DATE:  12/16 REFERRING MD:  Sherral Hammers, CHIEF COMPLAINT:  dyspnea   Brief History   69 y/o male admitted on 12/15 with COVID pneumonia, PCCM seen on 12/16.  Improved initially, was supposed to go to CIR. However by 1/5 he had worsening hypoxemia and infiltrates.  On 1/6 moved to ICU and intubated.   Past Medical History   Past Medical History:  Diagnosis Date  . Arthritis    "elbows & knees" (12/13/2014)  . Asthma   . CKD (chronic kidney disease), stage III   . DIABETIC FOOT ULCER 06/20/2009  . Edema, macular, due to secondary diabetes ([place])   . Erectile dysfunction   . GERD (gastroesophageal reflux disease)   . HEARING LOSS, SENSORINEURAL, BILATERAL 01/03/2007   Seen by ENT Dr. Orpah Greek D. Redmond Baseman 01/03/07  . Hemorrhoids   . History of echocardiogram    a. 04/2008 Echo: EF 50-55%, abnl LV relaxation, mildly dil LA.  Marland Kitchen Hyperlipidemia   . Hypertension   . Morbid obesity ([place])   . Neuropathy, lower extremity   . OSA (obstructive sleep apnea)    uses CPAP nightly  . OSA on CPAP    Nocturnal polysomnogram on 01/21/2010 showed severe obstructive sleep apnea/hypopnea syndrome, AHI 74.1 per hour with non positional events, moderately loud snoring, and oxygen desaturation to a nadir of 78% on room air.  CPAP was successfully titrated to 17 CWP, AHI 1.1 per hour using a large ResMed Mirage Quattro full-face mask with heated humidifier. Bruxism was noted.   . Osteomyelitis of ankle and foot ([place])   . Retinopathy   . Type II diabetes mellitus ([place])    w/complication NOS, type II  ]   Significant Hospital Events   12/15 admit 1/1 down to 3-4 L O2 1/4-1/6 worsening hypoxemia, d-dimer up, intubated on 1/6 with worsening infiltrates  Consults:  PCCM  Procedures:  ETT 1/6 >  R IJ CVL 1/6 >   Significant Diagnostic Tests:  CXR 12/15 > extensive b/l airspace disease with vascular  congestion. Echo 12/19 LVEF 5055%, RV normal not well visualized CT chest 12/24 and 12/27 >> both show extensive bilateral patchy airspace disease, enlarged pectoralis major on the left with area of edema/hemorrhage and hematoma  Micro Data:  Flu 12/15 > neg. SARS CoV 2 12/15 > pos. 1/6 resp culture > GPC in clusters, GNR, GN coccobacilli 1/6 blood >   Antimicrobials:  12/15 remdesivir > 12/19 12/15 decadron > 10 days  1/6 cefepime >  1/7 linezolid >  Interim history/subjective:  Kidney function worse Liver failure Epinephrine max dose as are all other vasopressors Plateau 37  Objective   Blood pressure (!) 131/43, pulse 97, temperature 99.8 F (37.7 C), temperature source Oral, resp. rate (!) 0, height 6\' 6"  (1.981 m), weight (!) 207.7 kg, SpO2 100 %.    Vent Mode: PRVC FiO2 (%):  [70 %-100 %] 70 % Set Rate:  [35 bmp] 35 bmp Vt Set:  [640 mL] 640 mL PEEP:  [16 cmH20] 16 cmH20 Plateau Pressure:  [35 cmH20-38 cmH20] 35 cmH20   Intake/Output Summary (Last 24 hours) at 08-14-19 1111 Last data filed at 08-14-19 0600 Gross per 24 hour  Intake 6881.08 ml  Output 80 ml  Net 6801.08 ml   Filed Weights   07/15/19 0352 07/16/19 0500 2019/08/14 0500  Weight: (!) 175.5 kg (!) 175.3 kg (!) 207.7 kg  Examination:  General:  In bed on vent HENT: NCAT ETT in place PULM: CTA B, vent supported breathing CV: RRR, no mgr GI: BS+, soft, nontender MSK: normal bulk and tone Neuro: sedated on vent    Resolved Hospital Problem list     Assessment & Plan:  ARDS due to COVID 19 pneumonia Worsening infiltrates on 1/7> due to HCAP Continue full vent support per ARDS protocol for now Will need to withdraw care later today as there is nothing more we can offer to help him  Shock: septic due to bacterial pneumonia, severe Continue epinephrine, vasopressin, levophed for now  Atrial fibrillation, SVT 1/7  D-dimer elevated Heparin infusion for now Continue amiodarone  AKI  worsening, oliguric: would not tolerate CVVHD Monitor BMET and UOP Replace electrolytes as needed  Metabolic acidosis  Cont bicarb infusion  Need for sedation for mechanical vent Continue paralytic protocol for now   He is dying, there is nothing more we can offer at this point that will save his life.  He has severe multi-organ failure due to septic shock. Will need to stop nimbex when we withdraw care, after family visits.  Best practice:  Diet: tube feeding Pain/Anxiety/Delirium protocol (if indicated): yes VAP protocol (if indicated): yes DVT prophylaxis: eliquis GI prophylaxis: famoti Glucose control: ssi Mobility: bed rest Code Status: DNR Family Communication:I updated his wife Mae and let her know she needs to come in for a visit today Disposition: remain in ICU  Labs   CBC: Recent Labs  Lab 07/17/19 0847 07/18/19 0640 07/19/19 0945 07/20/19 0330 07/20/19 0540 07/20/19 1047 07/20/19 1100 Aug 03, 2019 0800  WBC 7.9 8.3 9.9 10.5  --   --  10.6* 13.4*  NEUTROABS 6.1 6.3 8.2* 8.7*  --   --   --  10.5*  HGB 7.2* 7.2* 8.9* 9.2* 9.9* 8.8* 8.3* 8.7*  HCT 24.2* 24.2* 28.7* 30.6* 29.0* 26.0* 27.5* 30.9*  MCV 92.7 92.7 91.4 94.4  --   --  94.2 101.3*  PLT 172 148* 130* 144*  --   --  142* [id number]    Basic Metabolic Panel: Recent Labs  Lab 07/18/19 0640 07/19/19 0945 07/20/19 0330 07/20/19 0540 07/20/19 1047 07/20/19 1100 07/20/19 1527 08-03-2019 0800  NA 139 140 145 145 147* 148*  --  145  K 4.1 4.2 4.9 4.9 4.7 4.8  --  6.1*  CL 107 108 108  --   --  108  --  99  CO2 21* 20* 25  --   --  28  --  14*  GLUCOSE 106* 181* 197*  --   --  204*  --  333*  BUN 29* 31* 40*  --   --  43*  --  60*  CREATININE 1.68* 1.92* 2.75*  --   --  3.09*  --  5.44*  CALCIUM 8.0* 8.2* 7.6*  --   --  7.2*  --  6.6*  MG 1.7 1.8 1.9  --   --   --  2.9* 2.5*  PHOS 3.5 4.4 6.8*  --   --   --  3.9 12.8*   GFR: Estimated Creatinine Clearance: 25 mL/min (A) (by C-G formula based on SCr of  5.44 mg/dL (H)). Recent Labs  Lab 07/19/19 0945 07/19/19 1742 07/20/19 0330 07/20/19 1100 07/20/19 1132 03-Aug-2019 0530 08/03/19 0800  PROCALCITON  --  2.30 19.29  --   --  QUESTIONABLE RESULTS, RECOMMEND RECOLLECT TO VERIFY 32.18  WBC 9.9  --  10.5 10.6*  --   --  13.4*  LATICACIDVEN  --   --   --  3.1* 4.1*  --   --     Liver Function Tests: Recent Labs  Lab 07/18/19 0640 07/19/19 0945 07/20/19 0330 07/20/19 1100 August 09, 2019 0800  AST 23 31 74* 66* 13,063*  ALT 21 24 51* 47* 4,199*  ALKPHOS 95 135* 169* 154* 214*  BILITOT 1.0 2.1* 2.7* 2.3* 3.4*  PROT 6.0* 6.5 6.7 6.0* 6.0*  ALBUMIN 2.2* 2.2* 2.0* 1.8* 1.7*   No results for input(s): LIPASE, AMYLASE in the last 168 hours. No results for input(s): AMMONIA in the last 168 hours.  ABG    Component Value Date/Time   PHART 7.264 (L) 07/20/2019 1047   PCO2ART 65.1 (HH) 07/20/2019 1047   PO2ART 101.0 07/20/2019 1047   HCO3 29.5 (H) 07/20/2019 1047   TCO2 31 07/20/2019 1047   ACIDBASEDEF 1.0 07/20/2019 0540   O2SAT 97.0 07/20/2019 1047     Coagulation Profile: Recent Labs  Lab 07/16/19 1030  INR 1.1    Cardiac Enzymes: No results for input(s): CKTOTAL, CKMB, CKMBINDEX, TROPONINI in the last 168 hours.  HbA1C: Hemoglobin A1C  Date/Time Value Ref Range Status  06/06/2019 10:46 AM 8.2 (A) 4.0 - 5.6 % Final   Hgb A1c MFr Bld  Date/Time Value Ref Range Status  02/28/2019 10:10 AM 9.8 (H) 4.6 - 6.5 % Final    Comment:    Glycemic Control Guidelines for People with Diabetes:Non Diabetic:  <6%Goal of Therapy: <7%Additional Action Suggested:  >8%   11/14/2018 09:54 AM 10.5 (H) 4.6 - 6.5 % Final    Comment:    Glycemic Control Guidelines for People with Diabetes:Non Diabetic:  <6%Goal of Therapy: <7%Additional Action Suggested:  >8%     CBG: Recent Labs  Lab 07/20/19 1741 07/20/19 1957 07/20/19 2353 2019-08-09 0345 08/09/19 0753  GLUCAP 179* 189* 263* 255* 279*     Critical care time: 35 minutes    Roselie Awkward, MD [place] Pager: [phone] Cell: [phone] If no response, call [phone]

## 2019-08-14 NOTE — Progress Notes (Signed)
[facility] called. Referral # [id number]. Call back with cardiac TOD.

## 2019-08-14 NOTE — Progress Notes (Signed)
[place] for heparin Indication: atrial fibrillation (Eliquis PTA); rising d-dimer  Allergies  Allergen Reactions  . Vancomycin     REACTION: ARF    Patient Measurements: Height: 6\' 6"  (198.1 cm) Weight: (!) 458 lb (207.7 kg) IBW/kg (Calculated) : 91.4 Heparin Dosing Weight: 135 kg  Vital Signs: Temp: 99.8 F (37.7 C) (01/08 0730) Temp Source: Oral (01/08 0730) BP: 131/43 (01/08 0830) Pulse Rate: 97 (01/08 0830)  Labs: Recent Labs    07/20/19 0330 07/20/19 1047 07/20/19 1100 07/20/19 2040 08-18-2019 0756 2019/08/18 0800  HGB 9.2* 8.8* 8.3*  --   --  8.7*  HCT 30.6* 26.0* 27.5*  --   --  30.9*  PLT 144*  --  142*  --   --  219  HEPARINUNFRC  --   --   --  <0.10* <0.10*  --   CREATININE 2.75*  --  3.09*  --   --  5.44*    Estimated Creatinine Clearance: 25 mL/min (A) (by C-G formula based on SCr of 5.44 mg/dL (H)).   Assessment: 53 YOM with h/o Afib on apixaban at home was transitioned to IV heparin after admission to the hospital but then developed a chest wall hematoma on 1/2 requiring blood transfusion. Hgb now stable and given high risk of VTE and rising D-dimer, resuming IV heparin  H/H low stable, Plt low stable, SCr trending up  Noted pt with therapeutic levels on 1850 units/hr previously during this admission.  Heparin level undetectable on 1700 units/hr. No issues with line or bleeding reported per RN.  Goal of Therapy:  Heparin level 0.3-0.5 units/ml Monitor platelets by anticoagulation protocol: Yes   Plan:  Will hold off any heparin adjustments as team is planning withdrawal of care once family visits   Albertina Parr, PharmD., BCPS Clinical Pharmacist Clinical phone for 07/20/18 until 5pm: [phone]

## 2019-08-14 NOTE — Death Summary Note (Signed)
Death Summary  [name] [id number] DOB: [birth year] DOA: 07-09-19  PCP: Aldine Contes, MD PCP/Office notified: No  Admit date: Jul 09, 2019 Date of Death: 02-Aug-2019  Final Diagnoses:  Principal Problem:   COVID-19 with multiple comorbidities Active Problems:   Diabetes type 2, uncontrolled ([place])   Chronic kidney disease, stage III (moderate) (H[facility])   Morbid obesity ([place])   Hypertension   Paroxysmal atrial fibrillation with RVR ([place])   Pneumonia due to COVID-19 virus   Acute respiratory failure with hypoxia (H[facility])   Chest wall hematoma, left, subsequent encounter   Elevated d-dimer   Atrial fibrillation with RVR ([place])   Diabetes mellitus type 2, uncontrolled, with complications (H[facility])   Diabetic neuropathy (H[facility])   Obesity hypoventilation syndrome (H[facility])   OSA on CPAP   Pressure injury of skin   Acute blood loss anemia  Covid pneumonia/acute respiratory failure with hypoxia -Completed course Remdesivir -Complete course Steroids -12/19 transfuse 1 unit Covid convalescent plasma  HCAP -1/6 start 7-day course HCAP pneumonia antibiotics -Trend procalcitonin -1/6 PCXR multifocal pneumonia RIGHT>> LEFT  Acute hypoxemic respiratory failure due to COVID-19 pneumonia, now with ARDS and possible superimposed HCAP.  Plan: Mechanical ventilation via ARDS protocol, target PRVC 6 cc/kg Wean PEEP and FiO2 as able, recruitment maneuver now, 1.00+ PEEP 16 for initial settings Goal plateau pressure less than 30, driving pressure less than 15 Paralytics, initiate now for vent synchrony and given severe gas exchange defect Cycle prone positioning if necessary for oxygenation.  Assess follow-up ABG and decide Deep sedation per PAD protocol, goal RASS -4, currently fentanyl, propofol infusions Diuresis as blood pressure and renal function can tolerate, goal CVP 5-8 VAP prevention order set Agree with empiric cefepime, follow culture data   LEFT Chest Wall Hematoma -Unable  to appreciate on exam secondary to patient's habitus -12/27 repeat CT chest; continues to show hematoma.  However given patient's rising D-dimer and A. fib will start heparin drip, maintain on low side of therapeutic range.  Elevated D-dimer -Elevated D-dimer while on Eliquis, he did miss some days while he was sick.He was transitioned to full dose lovenox.  -LE [place] was negative for DVT andecho was as follows(normal RV systolic function, moderately elevated PASP, low normal EF - see report).  -See A. fib  A. fib with RVR -Currently NSR -Amlodipine 10 mg daily -12/30 increase Metoprolol 37.5 mg QID -12/27 Heparin drip restarted lowest therapeutic dosing per pharmacy -1/6 restart Eliquis; note D-dimer climbing -Clonidine 0.1 mg BID  Essential HTN -See A. fib  Acute on CKD stage III (baseline Cr 1.5) -Continue to hold ACE I -Strict in and out -16.3 L -Daily weight      Filed Weights   07/14/19 0637 07/15/19 0352 07/16/19 0500  Weight: (!) 175.5 kg (!) 175.5 kg (!) 175.3 kg  -KVO normal saline  Hypomagnesmia -Magnesium level> 2   Elevated LFT -Acute hepatitis panel negative -Resolved  Diabetes type 2 uncontrolled with complication/Diabetic Neuropathy -At home on U500 via pump; Per EMR uses 70 units which is~350 units of U100 insulin -12/27 decrease NovoLog 10 units qac -12/28 decrease Lantus 40 units daily -12/28 decrease moderate SSI  -Tradjenta 5 mg daily  [name] -Lipitor 20 mg daily -12/28 LDL= 35  OSA/OHS -On CPAP at home (on hold here) -See Covid pneumonia   Morbid obesity (BMI 46.37 kg/m) -12/27 nutrition consult morbidly obese, uncontrolled diabetes  Insomnia -trazodone prn qhs  Thrombocytopenia: -Resolved  Glaucoma:  -Continue home eye drops     History of present  illness:  69 yo BM PMHx diabetes type 2 uncontrolled with complication, diabetic peripheral neuropathy, OSA/OHS, morbid obesity, CKD STAGE III, atrial fibrillation, HTN,  atrial fibrillation on Eliquis, chronic pain syndrome, anemia, atrial fibrillation on Eliquis and multiple other medical problems   Presented on 12/15 with 1 week of SOB, difficulty sleeping, poor PO intake, and presyncopal symptoms. He was admitted from the internal medicine clinic. He's received steroids and remdesivir. Plan for plasma today.  Hospital Course:  Patient was treated for Covid pneumonia/acute respiratory failure with appropriate Covid protocol unfortunately patient never fully recovered.  Patient was taken back to the ICU where his respiratory and cardiac status continued to deteriorate.  Due to patient's continuing deterioration family decided to withdraw care.    Time: 1733  Signed:  Dia Crawford, MD [facility] [phone]

## 2019-08-14 NOTE — Progress Notes (Signed)
Spoke with patient's wife. Update provided and all questions answered. Patient's wife appreciative of update.

## 2019-08-14 NOTE — Progress Notes (Signed)
Notified Kathlee Nations with [facility] of patient's hypotension despite increasing vasopressor drips and UOP over night only 73mL for the shift. No new orders received. Will continue to monitor.

## 2019-08-14 DEATH — deceased

## 2019-09-12 ENCOUNTER — Ambulatory Visit: Payer: Medicare Other | Admitting: Internal Medicine

## 2020-05-18 IMAGING — CT CT CHEST W/O CM
1 of 3 series · 14 of 31 positions shown, 18 images · non-contrast
Comparison: None

CLINICAL DATA: Soft tissue swelling LEFT chest wall; history
5BPLQ-IM, stage III chronic kidney disease, hypertension, type II
diabetes mellitus

EXAM:
CT CHEST WITHOUT CONTRAST
TECHNIQUE: Multidetector CT imaging of the chest was performed following the
standard protocol without IV contrast. Sagittal and coronal MPR
images reconstructed from axial data set. Examination limited by
respiratory motion artifacts.

[Series 3: (person_name) thins · axial · 0.89mm/px · z∈[+588,+948]mm · 14 of 570 slices shown, 18 images]
[im 28/570  mediastinal]
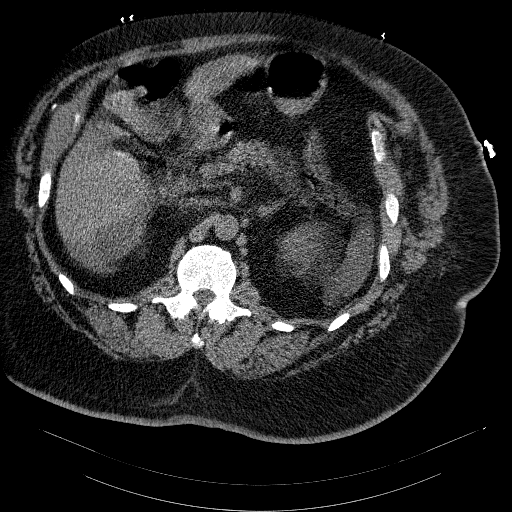
[im 28/570  lung]
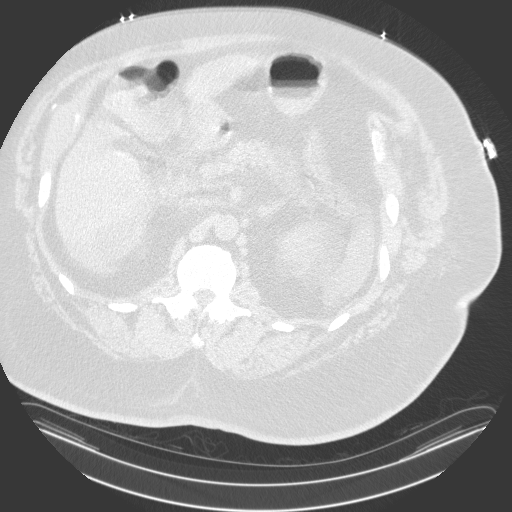
[im 82/570  lung]
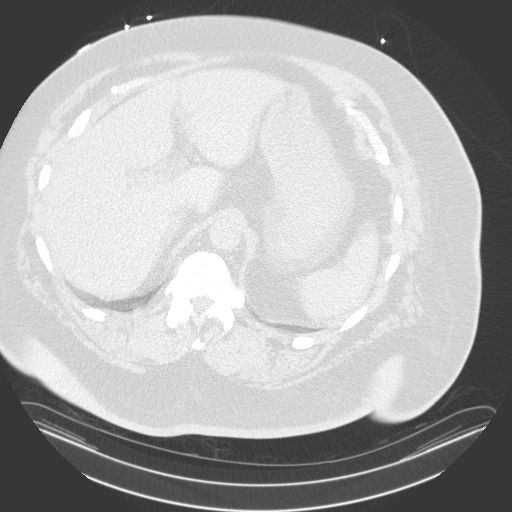
[im 109/570  lung]
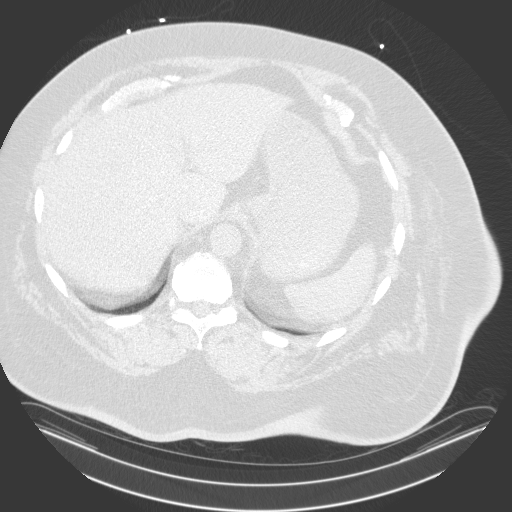
[im 163/570  lung]
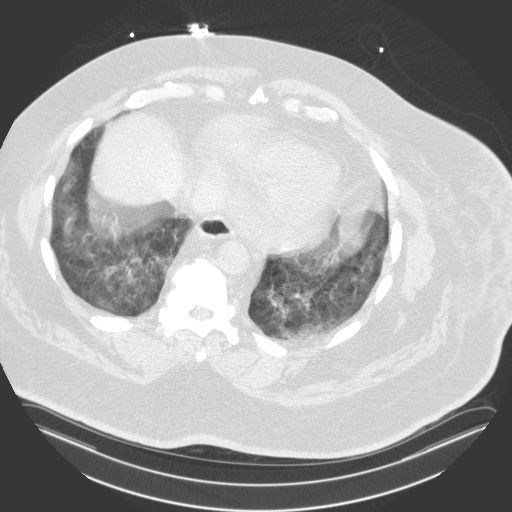
[im 190/570  mediastinal]
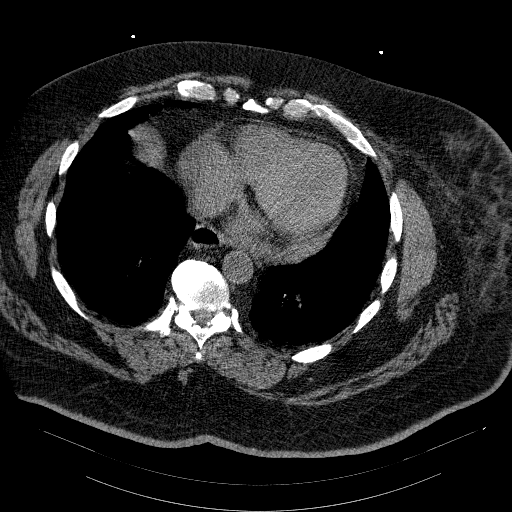
[im 190/570  lung]
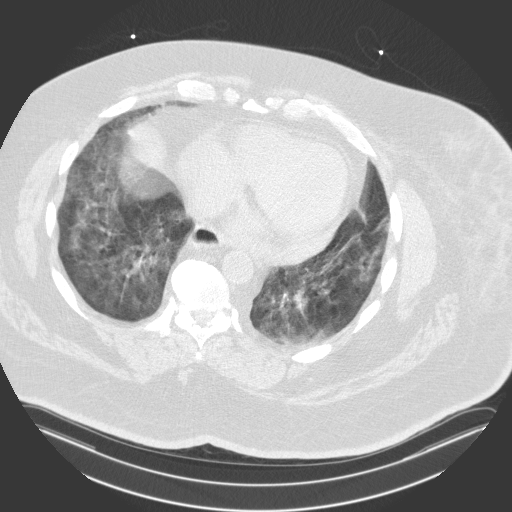
[im 244/570  lung]
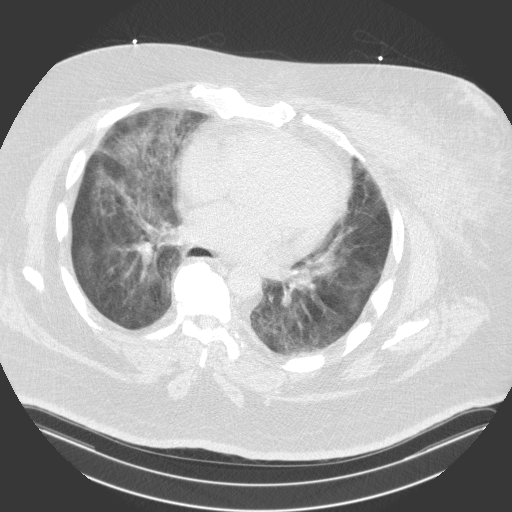
[im 271/570  lung]
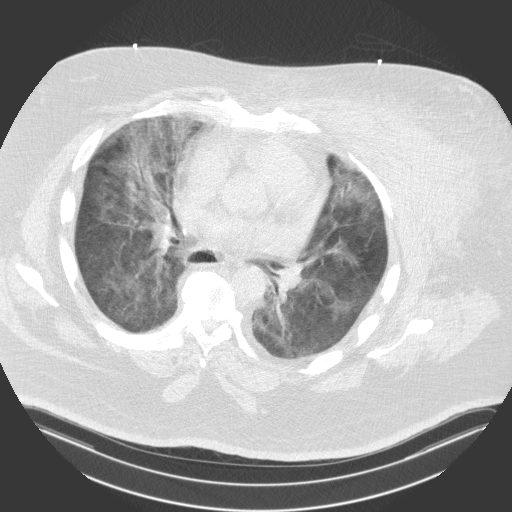
[im 299/570  lung]
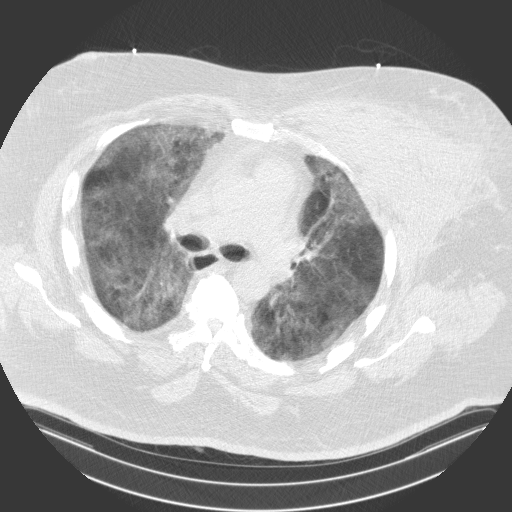
[im 326/570  mediastinal]
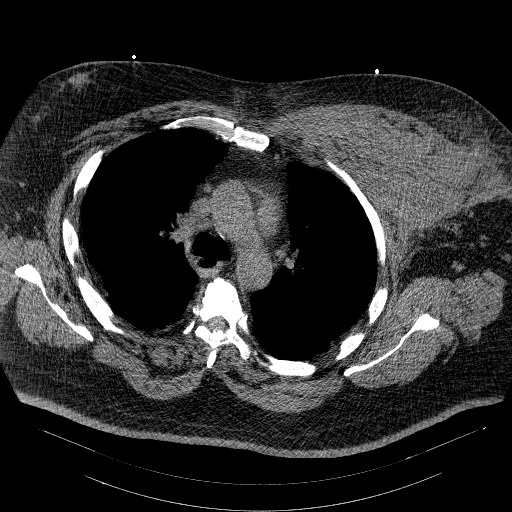
[im 326/570  lung]
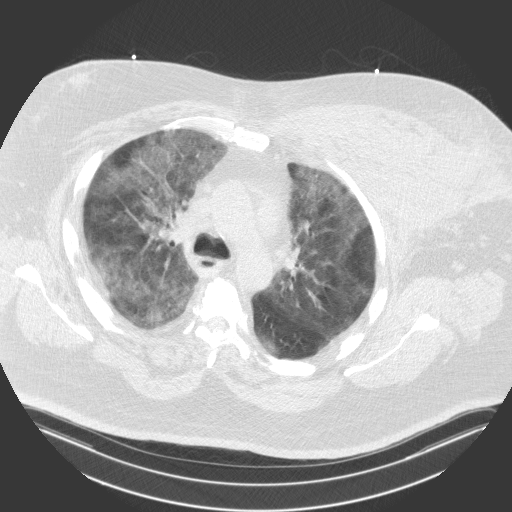
[im 380/570  lung]
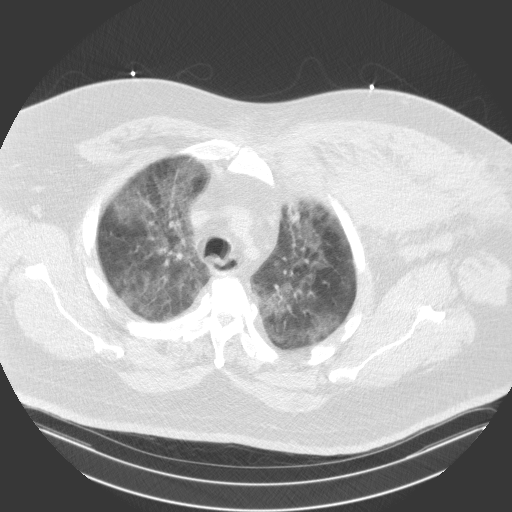
[im 407/570  lung]
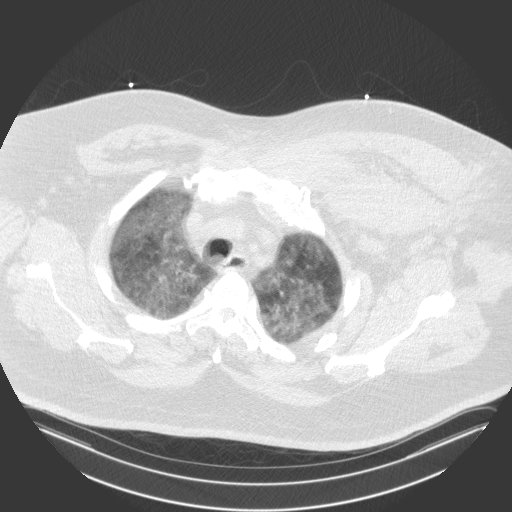
[im 461/570  lung]
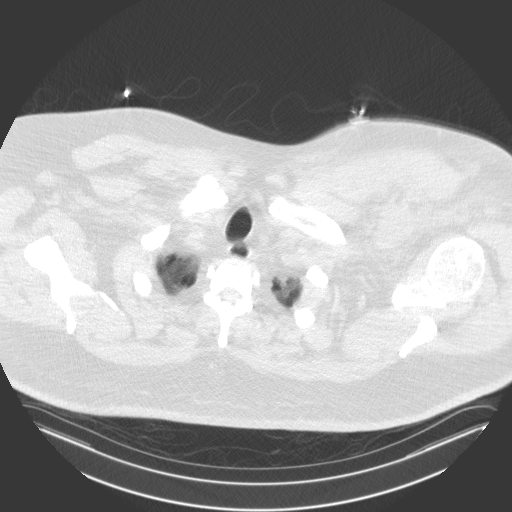
[im 488/570  mediastinal]
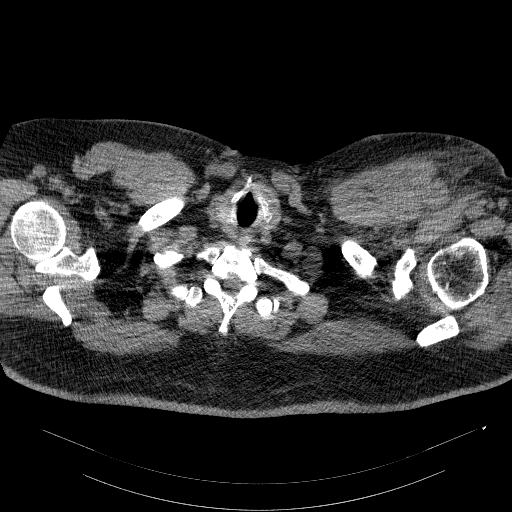
[im 488/570  lung]
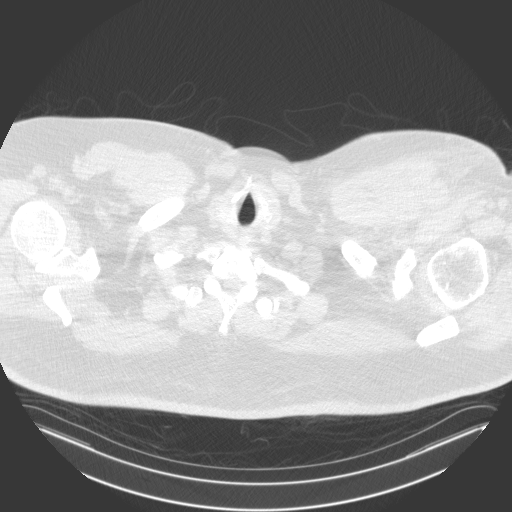
[im 542/570  lung]
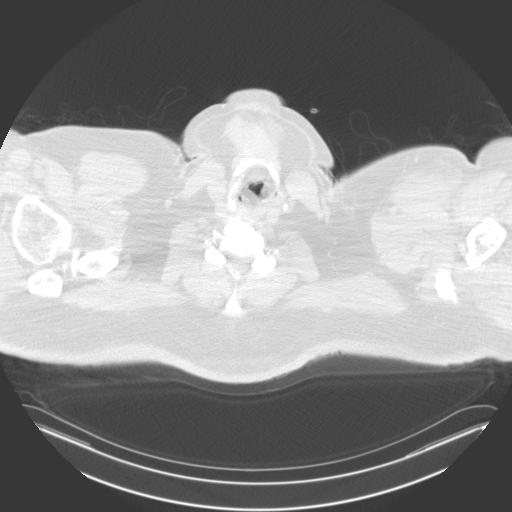

[14 of 31 positions shown; findings below may reference images not displayed]

FINDINGS: Cardiovascular: Mild atherosclerotic calcification aorta and
coronary arteries. Heart normal size. Minimal pericardial effusion.

Mediastinum/Nodes: Base of cervical region normal appearance.
Air-filled esophagus throughout its length. No thoracic adenopathy.

Lungs/Pleura: Patchy airspace infiltrates bilaterally consistent
with multifocal pneumonia and 5BPLQ-IM. Lung assessment suboptimal
due to respiratory motion artifacts. No gross mass, pleural
effusion, or pneumothorax.

Upper Abdomen: Suboptimal assessment of upper abdomen due to
respiratory motion. No gross upper abdominal abnormality seen.

Musculoskeletal: Degenerative disc disease changes thoracic spine.
Degenerative changes of sternoclavicular and acromioclavicular
joints. Old healed LEFT rib fractures. No acute osseous findings.
Diffuse soft tissue swelling identified at the LEFT pectoralis major
and minor muscles. Particularly enlarged LEFT pectoralis minor with
intermediate attenuation question hematoma. Infiltrative changes are
seen surrounding the pectoralis muscles extending in the
subcutaneous soft tissue of the LEFT chest wall as well as the
retropectoral and interpectoral fat. Infiltrative changes extend
down the lateral LEFT chest wall at the level of the diaphragm. No
soft tissue gas, calcification or radiopaque foreign bodies.
IMPRESSION: Patchy BILATERAL airspace infiltrates consistent with multifocal
pneumonia and 5BPLQ-IM.

Minimal pericardial effusion.

Diffuse edema and enlargement of the LEFT pectoralis muscles
especially pectoralis minor with surrounding infiltrative changes
extending into the LEFT lateral chest wall.

Favor the above changes being related to chest wall
hemorrhage/hematoma though unable to exclude infection by CT.

Underlying mass not completely excluded; recommend follow-up until
resolution.

Aortic Atherosclerosis ([id number]).
